# Patient Record
Sex: Male | Born: 1950 | Race: White | Hispanic: No | Marital: Married | Smoking: Former smoker
Health system: Southern US, Community
[De-identification: ages and names within clinical notes are randomized; demographics above are authoritative.]

## PROBLEM LIST (undated history)

## (undated) DIAGNOSIS — G9340 Encephalopathy, unspecified: Secondary | ICD-10-CM

## (undated) DIAGNOSIS — R590 Localized enlarged lymph nodes: Secondary | ICD-10-CM

## (undated) DIAGNOSIS — N400 Enlarged prostate without lower urinary tract symptoms: Secondary | ICD-10-CM

## (undated) DIAGNOSIS — I5031 Acute diastolic (congestive) heart failure: Secondary | ICD-10-CM

## (undated) DIAGNOSIS — G4730 Sleep apnea, unspecified: Secondary | ICD-10-CM

## (undated) DIAGNOSIS — K635 Polyp of colon: Secondary | ICD-10-CM

## (undated) DIAGNOSIS — B029 Zoster without complications: Secondary | ICD-10-CM

## (undated) DIAGNOSIS — J9621 Acute and chronic respiratory failure with hypoxia: Secondary | ICD-10-CM

## (undated) DIAGNOSIS — Z7901 Long term (current) use of anticoagulants: Secondary | ICD-10-CM

## (undated) DIAGNOSIS — D472 Monoclonal gammopathy: Secondary | ICD-10-CM

## (undated) DIAGNOSIS — R413 Other amnesia: Secondary | ICD-10-CM

## (undated) DIAGNOSIS — J449 Chronic obstructive pulmonary disease, unspecified: Secondary | ICD-10-CM

## (undated) DIAGNOSIS — I739 Peripheral vascular disease, unspecified: Secondary | ICD-10-CM

## (undated) DIAGNOSIS — I4891 Unspecified atrial fibrillation: Secondary | ICD-10-CM

## (undated) DIAGNOSIS — G4733 Obstructive sleep apnea (adult) (pediatric): Secondary | ICD-10-CM

## (undated) DIAGNOSIS — E1140 Type 2 diabetes mellitus with diabetic neuropathy, unspecified: Secondary | ICD-10-CM

## (undated) DIAGNOSIS — E669 Obesity, unspecified: Secondary | ICD-10-CM

## (undated) DIAGNOSIS — K219 Gastro-esophageal reflux disease without esophagitis: Secondary | ICD-10-CM

## (undated) DIAGNOSIS — E11319 Type 2 diabetes mellitus with unspecified diabetic retinopathy without macular edema: Secondary | ICD-10-CM

## (undated) DIAGNOSIS — D122 Benign neoplasm of ascending colon: Secondary | ICD-10-CM

## (undated) DIAGNOSIS — M1990 Unspecified osteoarthritis, unspecified site: Secondary | ICD-10-CM

## (undated) DIAGNOSIS — R918 Other nonspecific abnormal finding of lung field: Secondary | ICD-10-CM

## (undated) DIAGNOSIS — C8590 Non-Hodgkin lymphoma, unspecified, unspecified site: Secondary | ICD-10-CM

## (undated) DIAGNOSIS — C8332 Diffuse large B-cell lymphoma, intrathoracic lymph nodes: Secondary | ICD-10-CM

## (undated) DIAGNOSIS — I10 Essential (primary) hypertension: Secondary | ICD-10-CM

## (undated) DIAGNOSIS — J9611 Chronic respiratory failure with hypoxia: Secondary | ICD-10-CM

## (undated) DIAGNOSIS — E785 Hyperlipidemia, unspecified: Secondary | ICD-10-CM

## (undated) DIAGNOSIS — D696 Thrombocytopenia, unspecified: Secondary | ICD-10-CM

## (undated) HISTORY — PX: APPENDECTOMY: SHX54

## (undated) HISTORY — DX: Zoster without complications: B02.9

## (undated) HISTORY — DX: Other nonspecific abnormal finding of lung field: R91.8

## (undated) HISTORY — DX: Peripheral vascular disease, unspecified: I73.9

## (undated) HISTORY — DX: Essential (primary) hypertension: I10

## (undated) HISTORY — DX: Monoclonal gammopathy: D47.2

## (undated) HISTORY — DX: Acute diastolic (congestive) heart failure: I50.31

## (undated) HISTORY — DX: Polyp of colon: K63.5

## (undated) HISTORY — DX: Chronic respiratory failure with hypoxia: J96.11

## (undated) HISTORY — DX: Obesity, unspecified: E66.9

## (undated) HISTORY — DX: Unspecified osteoarthritis, unspecified site: M19.90

## (undated) HISTORY — DX: Benign neoplasm of ascending colon: D12.2

## (undated) HISTORY — DX: Benign prostatic hyperplasia without lower urinary tract symptoms: N40.0

## (undated) HISTORY — DX: Encephalopathy, unspecified: G93.40

## (undated) HISTORY — DX: Acute and chronic respiratory failure with hypoxia: J96.21

## (undated) HISTORY — DX: Long term (current) use of anticoagulants: Z79.01

## (undated) HISTORY — DX: Type 2 diabetes mellitus with unspecified diabetic retinopathy without macular edema: E11.319

## (undated) HISTORY — DX: Localized enlarged lymph nodes: R59.0

## (undated) HISTORY — DX: Chronic obstructive pulmonary disease, unspecified: J44.9

## (undated) HISTORY — DX: Type 2 diabetes mellitus with diabetic neuropathy, unspecified: E11.40

## (undated) HISTORY — DX: Diffuse large b-cell lymphoma, intrathoracic lymph nodes: C83.32

## (undated) HISTORY — DX: Obstructive sleep apnea (adult) (pediatric): G47.33

## (undated) HISTORY — DX: Gastro-esophageal reflux disease without esophagitis: K21.9

## (undated) HISTORY — DX: Other amnesia: R41.3

## (undated) HISTORY — DX: Hyperlipidemia, unspecified: E78.5

## (undated) HISTORY — DX: Thrombocytopenia, unspecified: D69.6

---

## 2001-01-16 ENCOUNTER — Encounter: Payer: Self-pay | Admitting: Gastroenterology

## 2001-01-16 ENCOUNTER — Encounter (INDEPENDENT_AMBULATORY_CARE_PROVIDER_SITE_OTHER): Payer: Self-pay | Admitting: *Deleted

## 2001-01-16 ENCOUNTER — Other Ambulatory Visit: Admission: RE | Admit: 2001-01-16 | Discharge: 2001-01-16 | Payer: Self-pay | Admitting: Gastroenterology

## 2004-02-04 ENCOUNTER — Encounter: Payer: Self-pay | Admitting: Gastroenterology

## 2004-03-10 ENCOUNTER — Ambulatory Visit: Payer: Self-pay | Admitting: Cardiology

## 2005-02-22 ENCOUNTER — Ambulatory Visit: Payer: Self-pay | Admitting: Internal Medicine

## 2005-02-22 ENCOUNTER — Inpatient Hospital Stay (HOSPITAL_COMMUNITY): Admission: AD | Admit: 2005-02-22 | Discharge: 2005-02-24 | Payer: Self-pay | Admitting: Internal Medicine

## 2006-11-03 ENCOUNTER — Ambulatory Visit: Payer: Self-pay | Admitting: Internal Medicine

## 2006-11-23 ENCOUNTER — Ambulatory Visit: Payer: Self-pay | Admitting: Internal Medicine

## 2007-04-23 ENCOUNTER — Telehealth: Payer: Self-pay | Admitting: Internal Medicine

## 2007-04-29 DIAGNOSIS — E119 Type 2 diabetes mellitus without complications: Secondary | ICD-10-CM | POA: Insufficient documentation

## 2007-04-29 DIAGNOSIS — Z794 Long term (current) use of insulin: Secondary | ICD-10-CM

## 2007-04-29 DIAGNOSIS — J189 Pneumonia, unspecified organism: Secondary | ICD-10-CM | POA: Insufficient documentation

## 2007-04-29 DIAGNOSIS — I10 Essential (primary) hypertension: Secondary | ICD-10-CM | POA: Insufficient documentation

## 2007-04-29 DIAGNOSIS — K219 Gastro-esophageal reflux disease without esophagitis: Secondary | ICD-10-CM | POA: Insufficient documentation

## 2007-04-29 DIAGNOSIS — J441 Chronic obstructive pulmonary disease with (acute) exacerbation: Secondary | ICD-10-CM | POA: Insufficient documentation

## 2007-04-29 DIAGNOSIS — J302 Other seasonal allergic rhinitis: Secondary | ICD-10-CM | POA: Insufficient documentation

## 2007-04-29 DIAGNOSIS — J3089 Other allergic rhinitis: Secondary | ICD-10-CM

## 2007-04-29 DIAGNOSIS — Z87898 Personal history of other specified conditions: Secondary | ICD-10-CM

## 2007-04-29 DIAGNOSIS — E785 Hyperlipidemia, unspecified: Secondary | ICD-10-CM

## 2007-10-16 ENCOUNTER — Ambulatory Visit: Payer: Self-pay | Admitting: Internal Medicine

## 2007-10-16 ENCOUNTER — Inpatient Hospital Stay (HOSPITAL_COMMUNITY): Admission: EM | Admit: 2007-10-16 | Discharge: 2007-10-18 | Payer: Self-pay | Admitting: Emergency Medicine

## 2007-10-22 ENCOUNTER — Telehealth: Payer: Self-pay | Admitting: Internal Medicine

## 2007-11-01 ENCOUNTER — Ambulatory Visit: Payer: Self-pay | Admitting: Internal Medicine

## 2008-03-03 ENCOUNTER — Ambulatory Visit: Payer: Self-pay | Admitting: Internal Medicine

## 2008-09-01 ENCOUNTER — Ambulatory Visit: Payer: Self-pay | Admitting: Internal Medicine

## 2009-01-22 ENCOUNTER — Encounter (INDEPENDENT_AMBULATORY_CARE_PROVIDER_SITE_OTHER): Payer: Self-pay | Admitting: *Deleted

## 2009-03-02 ENCOUNTER — Ambulatory Visit: Payer: Self-pay | Admitting: Gastroenterology

## 2009-03-02 DIAGNOSIS — Z860100 Personal history of colon polyps, unspecified: Secondary | ICD-10-CM

## 2009-03-02 HISTORY — DX: Personal history of colonic polyps: Z86.010

## 2009-03-02 HISTORY — DX: Personal history of colon polyps, unspecified: Z86.0100

## 2009-03-09 ENCOUNTER — Telehealth: Payer: Self-pay | Admitting: Gastroenterology

## 2009-03-09 ENCOUNTER — Encounter: Payer: Self-pay | Admitting: Gastroenterology

## 2009-03-18 ENCOUNTER — Ambulatory Visit: Payer: Self-pay | Admitting: Gastroenterology

## 2009-03-25 ENCOUNTER — Encounter: Payer: Self-pay | Admitting: Cardiology

## 2009-03-25 ENCOUNTER — Encounter: Payer: Self-pay | Admitting: Gastroenterology

## 2009-03-25 DIAGNOSIS — R9431 Abnormal electrocardiogram [ECG] [EKG]: Secondary | ICD-10-CM

## 2009-04-27 ENCOUNTER — Ambulatory Visit: Payer: Self-pay | Admitting: Cardiology

## 2009-04-27 ENCOUNTER — Encounter (INDEPENDENT_AMBULATORY_CARE_PROVIDER_SITE_OTHER): Payer: Self-pay | Admitting: *Deleted

## 2009-04-27 DIAGNOSIS — R9430 Abnormal result of cardiovascular function study, unspecified: Secondary | ICD-10-CM | POA: Insufficient documentation

## 2009-04-27 HISTORY — DX: Abnormal result of cardiovascular function study, unspecified: R94.30

## 2009-05-08 ENCOUNTER — Telehealth (INDEPENDENT_AMBULATORY_CARE_PROVIDER_SITE_OTHER): Payer: Self-pay | Admitting: *Deleted

## 2009-05-08 ENCOUNTER — Ambulatory Visit: Payer: Self-pay | Admitting: Cardiology

## 2009-12-17 ENCOUNTER — Ambulatory Visit: Payer: Self-pay | Admitting: Thoracic Surgery

## 2009-12-24 ENCOUNTER — Ambulatory Visit (HOSPITAL_COMMUNITY): Admission: RE | Admit: 2009-12-24 | Discharge: 2009-12-24 | Payer: Self-pay | Admitting: Thoracic Surgery

## 2009-12-29 ENCOUNTER — Ambulatory Visit: Payer: Self-pay | Admitting: Thoracic Surgery

## 2010-01-04 ENCOUNTER — Ambulatory Visit (HOSPITAL_COMMUNITY)
Admission: RE | Admit: 2010-01-04 | Discharge: 2010-01-04 | Payer: Self-pay | Source: Home / Self Care | Admitting: Thoracic Surgery

## 2010-01-06 ENCOUNTER — Ambulatory Visit: Payer: Self-pay | Admitting: Thoracic Surgery

## 2010-01-08 ENCOUNTER — Ambulatory Visit: Payer: Self-pay | Admitting: Oncology

## 2010-01-11 LAB — CBC & DIFF AND RETIC
Basophils Absolute: 0 10*3/uL (ref 0.0–0.1)
EOS%: 1.3 % (ref 0.0–7.0)
Eosinophils Absolute: 0.1 10*3/uL (ref 0.0–0.5)
HCT: 42.8 % (ref 38.4–49.9)
HGB: 14.7 g/dL (ref 13.0–17.1)
Immature Retic Fract: 2.7 % (ref 0.00–13.40)
MCH: 29.9 pg (ref 27.2–33.4)
MCV: 87.2 fL (ref 79.3–98.0)
MONO%: 6.4 % (ref 0.0–14.0)
NEUT#: 5.5 10*3/uL (ref 1.5–6.5)
NEUT%: 73.3 % (ref 39.0–75.0)
RDW: 12.9 % (ref 11.0–14.6)
Retic Ct Abs: 59.9 10*3/uL (ref 24.10–77.50)
lymph#: 1.4 10*3/uL (ref 0.9–3.3)

## 2010-01-11 LAB — MORPHOLOGY: PLT EST: ADEQUATE

## 2010-01-11 LAB — PROTIME-INR: INR: 1 — ABNORMAL LOW (ref 2.00–3.50)

## 2010-01-12 LAB — HEPATITIS A ANTIBODY, IGM: Hep A IgM: NEGATIVE

## 2010-01-12 LAB — COMPREHENSIVE METABOLIC PANEL
ALT: 18 U/L (ref 0–53)
AST: 14 U/L (ref 0–37)
Albumin: 4.1 g/dL (ref 3.5–5.2)
CO2: 27 mEq/L (ref 19–32)
Calcium: 9.9 mg/dL (ref 8.4–10.5)
Chloride: 101 mEq/L (ref 96–112)
Potassium: 4.3 mEq/L (ref 3.5–5.3)

## 2010-01-12 LAB — LACTATE DEHYDROGENASE: LDH: 126 U/L (ref 94–250)

## 2010-01-12 LAB — HEPATITIS C ANTIBODY: HCV Ab: NEGATIVE

## 2010-01-12 LAB — EPSTEIN-BARR VIRUS EARLY D ANTIGEN ANTIBODY, IGG: EBV EA IgG: 0.47 {ISR}

## 2010-01-12 LAB — EPSTEIN-BARR VIRUS VCA, IGG: EBV VCA IgG: 2.91 {ISR} — ABNORMAL HIGH

## 2010-01-12 LAB — EPSTEIN-BARR VIRUS NUCLEAR ANTIGEN ANTIBODY, IGG: EBV NA IgG: 3.33 {ISR} — ABNORMAL HIGH

## 2010-01-12 LAB — HEPATITIS B SURFACE ANTIGEN: Hepatitis B Surface Ag: NEGATIVE

## 2010-01-15 ENCOUNTER — Ambulatory Visit (HOSPITAL_COMMUNITY): Admission: RE | Admit: 2010-01-15 | Discharge: 2010-01-15 | Payer: Self-pay | Admitting: Oncology

## 2010-01-15 ENCOUNTER — Ambulatory Visit: Payer: Self-pay | Admitting: Oncology

## 2010-01-18 ENCOUNTER — Ambulatory Visit (HOSPITAL_COMMUNITY): Admission: RE | Admit: 2010-01-18 | Discharge: 2010-01-18 | Payer: Self-pay | Admitting: Oncology

## 2010-01-20 ENCOUNTER — Inpatient Hospital Stay (HOSPITAL_COMMUNITY): Admission: AD | Admit: 2010-01-20 | Discharge: 2010-01-25 | Payer: Self-pay | Admitting: Oncology

## 2010-01-26 DIAGNOSIS — C8590 Non-Hodgkin lymphoma, unspecified, unspecified site: Secondary | ICD-10-CM | POA: Insufficient documentation

## 2010-01-27 LAB — CBC WITH DIFFERENTIAL/PLATELET
BASO%: 0.5 % (ref 0.0–2.0)
EOS%: 1.6 % (ref 0.0–7.0)
HCT: 39.8 % (ref 38.4–49.9)
LYMPH%: 11.9 % — ABNORMAL LOW (ref 14.0–49.0)
MCH: 30 pg (ref 27.2–33.4)
MCHC: 34.4 g/dL (ref 32.0–36.0)
MONO#: 0 10*3/uL — ABNORMAL LOW (ref 0.1–0.9)
MONO%: 0.1 % (ref 0.0–14.0)
NEUT%: 85.9 % — ABNORMAL HIGH (ref 39.0–75.0)
Platelets: 182 10*3/uL (ref 140–400)
RBC: 4.57 10*6/uL (ref 4.20–5.82)
WBC: 6.5 10*3/uL (ref 4.0–10.3)

## 2010-01-31 ENCOUNTER — Emergency Department (HOSPITAL_COMMUNITY): Admission: EM | Admit: 2010-01-31 | Discharge: 2010-01-31 | Payer: Self-pay | Admitting: Emergency Medicine

## 2010-02-01 LAB — CBC WITH DIFFERENTIAL/PLATELET
BASO%: 2.2 % — ABNORMAL HIGH (ref 0.0–2.0)
EOS%: 4.4 % (ref 0.0–7.0)
HCT: 37.9 % — ABNORMAL LOW (ref 38.4–49.9)
MCH: 29.2 pg (ref 27.2–33.4)
MCHC: 34.8 g/dL (ref 32.0–36.0)
MONO#: 0.5 10*3/uL (ref 0.1–0.9)
NEUT%: 31.8 % — ABNORMAL LOW (ref 39.0–75.0)
RBC: 4.52 10*6/uL (ref 4.20–5.82)
RDW: 12.6 % (ref 11.0–14.6)
WBC: 1.8 10*3/uL — ABNORMAL LOW (ref 4.0–10.3)
lymph#: 0.6 10*3/uL — ABNORMAL LOW (ref 0.9–3.3)
nRBC: 0 % (ref 0–0)

## 2010-02-03 LAB — CBC WITH DIFFERENTIAL/PLATELET
Basophils Absolute: 0 10*3/uL (ref 0.0–0.1)
Eosinophils Absolute: 0.1 10*3/uL (ref 0.0–0.5)
HGB: 13.8 g/dL (ref 13.0–17.1)
MCV: 86.4 fL (ref 79.3–98.0)
MONO%: 2.5 % (ref 0.0–14.0)
NEUT#: 13.4 10*3/uL — ABNORMAL HIGH (ref 1.5–6.5)
RBC: 4.59 10*6/uL (ref 4.20–5.82)
RDW: 12.8 % (ref 11.0–14.6)
WBC: 15.1 10*3/uL — ABNORMAL HIGH (ref 4.0–10.3)
lymph#: 1.2 10*3/uL (ref 0.9–3.3)

## 2010-02-08 ENCOUNTER — Ambulatory Visit: Payer: Self-pay | Admitting: Oncology

## 2010-02-09 LAB — COMPREHENSIVE METABOLIC PANEL
ALT: 21 U/L (ref 0–53)
AST: 20 U/L (ref 0–37)
Albumin: 3.5 g/dL (ref 3.5–5.2)
Alkaline Phosphatase: 63 U/L (ref 39–117)
Chloride: 98 mEq/L (ref 96–112)
Potassium: 5 mEq/L (ref 3.5–5.3)
Sodium: 134 mEq/L — ABNORMAL LOW (ref 135–145)
Total Protein: 6.5 g/dL (ref 6.0–8.3)

## 2010-02-09 LAB — CBC WITH DIFFERENTIAL/PLATELET
Basophils Absolute: 0.5 10*3/uL — ABNORMAL HIGH (ref 0.0–0.1)
Eosinophils Absolute: 0.1 10*3/uL (ref 0.0–0.5)
HGB: 13.6 g/dL (ref 13.0–17.1)
LYMPH%: 11.6 % — ABNORMAL LOW (ref 14.0–49.0)
MONO#: 0.8 10*3/uL (ref 0.1–0.9)
NEUT#: 10.4 10*3/uL — ABNORMAL HIGH (ref 1.5–6.5)
Platelets: 270 10*3/uL (ref 140–400)
RBC: 4.46 10*6/uL (ref 4.20–5.82)
RDW: 13 % (ref 11.0–14.6)
WBC: 13.4 10*3/uL — ABNORMAL HIGH (ref 4.0–10.3)

## 2010-02-09 LAB — MORPHOLOGY

## 2010-02-10 ENCOUNTER — Inpatient Hospital Stay (HOSPITAL_COMMUNITY): Admission: AD | Admit: 2010-02-10 | Discharge: 2010-02-15 | Payer: Self-pay | Admitting: Oncology

## 2010-02-15 ENCOUNTER — Ambulatory Visit: Payer: Self-pay | Admitting: Oncology

## 2010-02-22 LAB — CBC WITH DIFFERENTIAL/PLATELET
BASO%: 2.1 % — ABNORMAL HIGH (ref 0.0–2.0)
EOS%: 4.1 % (ref 0.0–7.0)
HCT: 33.8 % — ABNORMAL LOW (ref 38.4–49.9)
LYMPH%: 22 % (ref 14.0–49.0)
MCH: 30.2 pg (ref 27.2–33.4)
MCHC: 34.9 g/dL (ref 32.0–36.0)
MCV: 86.5 fL (ref 79.3–98.0)
MONO#: 0.6 10*3/uL (ref 0.1–0.9)
MONO%: 18.5 % — ABNORMAL HIGH (ref 0.0–14.0)
NEUT%: 53.3 % (ref 39.0–75.0)
Platelets: 168 10*3/uL (ref 140–400)

## 2010-03-01 ENCOUNTER — Ambulatory Visit (HOSPITAL_COMMUNITY)
Admission: RE | Admit: 2010-03-01 | Discharge: 2010-03-01 | Payer: Self-pay | Source: Home / Self Care | Admitting: Oncology

## 2010-03-01 LAB — CBC WITH DIFFERENTIAL/PLATELET
EOS%: 0.3 % (ref 0.0–7.0)
MCH: 30.6 pg (ref 27.2–33.4)
MCV: 87.4 fL (ref 79.3–98.0)
MONO%: 5.8 % (ref 0.0–14.0)
NEUT#: 12 10*3/uL — ABNORMAL HIGH (ref 1.5–6.5)
RBC: 4.01 10*6/uL — ABNORMAL LOW (ref 4.20–5.82)
RDW: 14.4 % (ref 11.0–14.6)

## 2010-03-01 LAB — COMPREHENSIVE METABOLIC PANEL
ALT: 18 U/L (ref 0–53)
AST: 17 U/L (ref 0–37)
Albumin: 4.1 g/dL (ref 3.5–5.2)
Alkaline Phosphatase: 67 U/L (ref 39–117)
Potassium: 4.2 mEq/L (ref 3.5–5.3)
Sodium: 140 mEq/L (ref 135–145)
Total Protein: 6.3 g/dL (ref 6.0–8.3)

## 2010-03-04 ENCOUNTER — Inpatient Hospital Stay (HOSPITAL_COMMUNITY): Admission: AD | Admit: 2010-03-04 | Discharge: 2010-03-08 | Payer: Self-pay | Admitting: Oncology

## 2010-03-10 ENCOUNTER — Ambulatory Visit: Payer: Self-pay | Admitting: Oncology

## 2010-03-15 LAB — CBC WITH DIFFERENTIAL/PLATELET
Eosinophils Absolute: 0.1 10*3/uL (ref 0.0–0.5)
LYMPH%: 13.9 % — ABNORMAL LOW (ref 14.0–49.0)
MCHC: 35.7 g/dL (ref 32.0–36.0)
MCV: 87.4 fL (ref 79.3–98.0)
MONO%: 10.1 % (ref 0.0–14.0)
Platelets: 151 10*3/uL (ref 140–400)
RBC: 3.69 10*6/uL — ABNORMAL LOW (ref 4.20–5.82)

## 2010-03-15 LAB — URIC ACID: Uric Acid, Serum: 6.5 mg/dL (ref 4.0–7.8)

## 2010-03-15 LAB — COMPREHENSIVE METABOLIC PANEL
Alkaline Phosphatase: 98 U/L (ref 39–117)
Glucose, Bld: 331 mg/dL — ABNORMAL HIGH (ref 70–99)
Sodium: 138 mEq/L (ref 135–145)
Total Bilirubin: 0.8 mg/dL (ref 0.3–1.2)
Total Protein: 6.3 g/dL (ref 6.0–8.3)

## 2010-03-22 ENCOUNTER — Ambulatory Visit (HOSPITAL_COMMUNITY): Admission: RE | Admit: 2010-03-22 | Discharge: 2010-03-22 | Payer: Self-pay | Admitting: Oncology

## 2010-03-23 LAB — COMPREHENSIVE METABOLIC PANEL
ALT: 15 U/L (ref 0–53)
Albumin: 3.8 g/dL (ref 3.5–5.2)
CO2: 25 mEq/L (ref 19–32)
Calcium: 8.8 mg/dL (ref 8.4–10.5)
Chloride: 100 mEq/L (ref 96–112)
Creatinine, Ser: 0.78 mg/dL (ref 0.40–1.50)
Potassium: 4.1 mEq/L (ref 3.5–5.3)
Sodium: 138 mEq/L (ref 135–145)
Total Protein: 5.9 g/dL — ABNORMAL LOW (ref 6.0–8.3)

## 2010-03-23 LAB — CBC WITH DIFFERENTIAL/PLATELET
BASO%: 0.6 % (ref 0.0–2.0)
EOS%: 0.9 % (ref 0.0–7.0)
HCT: 34.6 % — ABNORMAL LOW (ref 38.4–49.9)
LYMPH%: 10.3 % — ABNORMAL LOW (ref 14.0–49.0)
MCH: 29.7 pg (ref 27.2–33.4)
MCHC: 33.8 g/dL (ref 32.0–36.0)
NEUT%: 79 % — ABNORMAL HIGH (ref 39.0–75.0)
Platelets: 217 10*3/uL (ref 140–400)

## 2010-03-23 LAB — LACTATE DEHYDROGENASE: LDH: 197 U/L (ref 94–250)

## 2010-03-24 ENCOUNTER — Inpatient Hospital Stay (HOSPITAL_COMMUNITY)
Admission: AD | Admit: 2010-03-24 | Discharge: 2010-03-28 | Payer: Self-pay | Source: Home / Self Care | Admitting: Oncology

## 2010-03-24 ENCOUNTER — Ambulatory Visit: Payer: Self-pay | Admitting: Oncology

## 2010-04-12 ENCOUNTER — Ambulatory Visit: Payer: Self-pay | Admitting: Oncology

## 2010-04-13 LAB — LACTATE DEHYDROGENASE: LDH: 225 U/L (ref 94–250)

## 2010-04-13 LAB — URIC ACID: Uric Acid, Serum: 4.9 mg/dL (ref 4.0–7.8)

## 2010-04-13 LAB — COMPREHENSIVE METABOLIC PANEL
ALT: 15 U/L (ref 0–53)
CO2: 25 mEq/L (ref 19–32)
Calcium: 8.9 mg/dL (ref 8.4–10.5)
Chloride: 97 mEq/L (ref 96–112)
Creatinine, Ser: 0.92 mg/dL (ref 0.40–1.50)
Glucose, Bld: 419 mg/dL — ABNORMAL HIGH (ref 70–99)
Total Protein: 6.2 g/dL (ref 6.0–8.3)

## 2010-04-13 LAB — CBC WITH DIFFERENTIAL/PLATELET
EOS%: 0.1 % (ref 0.0–7.0)
Eosinophils Absolute: 0 10*3/uL (ref 0.0–0.5)
LYMPH%: 4.5 % — ABNORMAL LOW (ref 14.0–49.0)
MCH: 31.4 pg (ref 27.2–33.4)
MCHC: 33.9 g/dL (ref 32.0–36.0)
MCV: 92.6 fL (ref 79.3–98.0)
MONO%: 6.7 % (ref 0.0–14.0)
NEUT#: 10 10*3/uL — ABNORMAL HIGH (ref 1.5–6.5)
Platelets: 263 10*3/uL (ref 140–400)
RBC: 3.6 10*6/uL — ABNORMAL LOW (ref 4.20–5.82)
RDW: 18.7 % — ABNORMAL HIGH (ref 11.0–14.6)

## 2010-04-13 LAB — MORPHOLOGY: PLT EST: ADEQUATE

## 2010-04-15 ENCOUNTER — Inpatient Hospital Stay (HOSPITAL_COMMUNITY)
Admission: AD | Admit: 2010-04-15 | Discharge: 2010-04-20 | Payer: Self-pay | Source: Home / Self Care | Attending: Oncology | Admitting: Oncology

## 2010-04-16 ENCOUNTER — Telehealth: Payer: Self-pay | Admitting: Internal Medicine

## 2010-04-16 ENCOUNTER — Encounter: Payer: Self-pay | Admitting: Oncology

## 2010-05-03 ENCOUNTER — Encounter: Payer: Self-pay | Admitting: Cardiology

## 2010-05-03 ENCOUNTER — Telehealth: Payer: Self-pay | Admitting: Cardiology

## 2010-05-03 LAB — CBC WITH DIFFERENTIAL/PLATELET
BASO%: 0.4 % (ref 0.0–2.0)
Basophils Absolute: 0 10*3/uL (ref 0.0–0.1)
EOS%: 0.5 % (ref 0.0–7.0)
Eosinophils Absolute: 0 10*3/uL (ref 0.0–0.5)
HCT: 32.2 % — ABNORMAL LOW (ref 38.4–49.9)
HGB: 10.9 g/dL — ABNORMAL LOW (ref 13.0–17.1)
LYMPH%: 12.5 % — ABNORMAL LOW (ref 14.0–49.0)
MCH: 31.5 pg (ref 27.2–33.4)
MCHC: 33.8 g/dL (ref 32.0–36.0)
MCV: 93.3 fL (ref 79.3–98.0)
MONO#: 0.6 10*3/uL (ref 0.1–0.9)
MONO%: 8.3 % (ref 0.0–14.0)
NEUT#: 6.1 10*3/uL (ref 1.5–6.5)
NEUT%: 78.3 % — ABNORMAL HIGH (ref 39.0–75.0)
Platelets: 256 10*3/uL (ref 140–400)
RBC: 3.45 10*6/uL — ABNORMAL LOW (ref 4.20–5.82)
RDW: 17.3 % — ABNORMAL HIGH (ref 11.0–14.6)
WBC: 7.8 10*3/uL (ref 4.0–10.3)
lymph#: 1 10*3/uL (ref 0.9–3.3)

## 2010-05-05 ENCOUNTER — Encounter: Payer: Self-pay | Admitting: Cardiology

## 2010-05-06 ENCOUNTER — Inpatient Hospital Stay (HOSPITAL_COMMUNITY)
Admission: AD | Admit: 2010-05-06 | Discharge: 2010-05-10 | Payer: Self-pay | Source: Home / Self Care | Attending: Oncology | Admitting: Oncology

## 2010-05-10 LAB — DIFFERENTIAL
Basophils Absolute: 0 10*3/uL (ref 0.0–0.1)
Basophils Relative: 0 % (ref 0–1)
Eosinophils Absolute: 0 10*3/uL (ref 0.0–0.7)
Eosinophils Relative: 0 % (ref 0–5)
Lymphocytes Relative: 8 % — ABNORMAL LOW (ref 12–46)
Lymphs Abs: 0.8 10*3/uL (ref 0.7–4.0)
Monocytes Absolute: 0.8 10*3/uL (ref 0.1–1.0)
Monocytes Relative: 8 % (ref 3–12)
Neutro Abs: 8.6 10*3/uL — ABNORMAL HIGH (ref 1.7–7.7)
Neutrophils Relative %: 84 % — ABNORMAL HIGH (ref 43–77)

## 2010-05-10 LAB — CBC
HCT: 31.8 % — ABNORMAL LOW (ref 39.0–52.0)
HCT: 29.6 % — ABNORMAL LOW (ref 39.0–52.0)
Hemoglobin: 10.6 g/dL — ABNORMAL LOW (ref 13.0–17.0)
Hemoglobin: 9.8 g/dL — ABNORMAL LOW (ref 13.0–17.0)
MCH: 31.7 pg (ref 26.0–34.0)
MCH: 31.5 pg (ref 26.0–34.0)
MCHC: 33.3 g/dL (ref 30.0–36.0)
MCHC: 33.1 g/dL (ref 30.0–36.0)
MCV: 95.2 fL (ref 78.0–100.0)
MCV: 95.2 fL (ref 78.0–100.0)
Platelets: 280 10*3/uL (ref 150–400)
Platelets: 270 10*3/uL (ref 150–400)
RBC: 3.34 MIL/uL — ABNORMAL LOW (ref 4.22–5.81)
RBC: 3.11 MIL/uL — ABNORMAL LOW (ref 4.22–5.81)
RDW: 17.1 % — ABNORMAL HIGH (ref 11.5–15.5)
RDW: 17 % — ABNORMAL HIGH (ref 11.5–15.5)
WBC: 8.1 10*3/uL (ref 4.0–10.5)
WBC: 10.1 10*3/uL (ref 4.0–10.5)

## 2010-05-10 LAB — GLUCOSE, CAPILLARY
Glucose-Capillary: 217 mg/dL — ABNORMAL HIGH (ref 70–99)
Glucose-Capillary: 256 mg/dL — ABNORMAL HIGH (ref 70–99)
Glucose-Capillary: 238 mg/dL — ABNORMAL HIGH (ref 70–99)
Glucose-Capillary: 155 mg/dL — ABNORMAL HIGH (ref 70–99)
Glucose-Capillary: 380 mg/dL — ABNORMAL HIGH (ref 70–99)
Glucose-Capillary: 308 mg/dL — ABNORMAL HIGH (ref 70–99)
Glucose-Capillary: 179 mg/dL — ABNORMAL HIGH (ref 70–99)
Glucose-Capillary: 153 mg/dL — ABNORMAL HIGH (ref 70–99)
Glucose-Capillary: 278 mg/dL — ABNORMAL HIGH (ref 70–99)
Glucose-Capillary: 280 mg/dL — ABNORMAL HIGH (ref 70–99)
Glucose-Capillary: 120 mg/dL — ABNORMAL HIGH (ref 70–99)
Glucose-Capillary: 134 mg/dL — ABNORMAL HIGH (ref 70–99)
Glucose-Capillary: 402 mg/dL — ABNORMAL HIGH (ref 70–99)
Glucose-Capillary: 410 mg/dL — ABNORMAL HIGH (ref 70–99)
Glucose-Capillary: 288 mg/dL — ABNORMAL HIGH (ref 70–99)

## 2010-05-10 LAB — PROTIME-INR
INR: 1.23 (ref 0.00–1.49)
INR: 1.22 (ref 0.00–1.49)
INR: 1.61 — ABNORMAL HIGH (ref 0.00–1.49)
INR: 1.64 — ABNORMAL HIGH (ref 0.00–1.49)
Prothrombin Time: 15.7 seconds — ABNORMAL HIGH (ref 11.6–15.2)
Prothrombin Time: 15.6 seconds — ABNORMAL HIGH (ref 11.6–15.2)
Prothrombin Time: 19.3 seconds — ABNORMAL HIGH (ref 11.6–15.2)
Prothrombin Time: 19.6 seconds — ABNORMAL HIGH (ref 11.6–15.2)

## 2010-05-10 LAB — URINALYSIS, ROUTINE W REFLEX MICROSCOPIC
Bilirubin Urine: NEGATIVE
Hgb urine dipstick: NEGATIVE
Ketones, ur: NEGATIVE mg/dL
Leukocytes, UA: NEGATIVE
Nitrite: NEGATIVE
Protein, ur: NEGATIVE mg/dL
Specific Gravity, Urine: 1.03 (ref 1.005–1.030)
Urine Glucose, Fasting: >1000 mg/dL — AB
Urobilinogen, UA: 0.2 mg/dL (ref 0.0–1.0)
pH: 6 (ref 5.0–8.0)

## 2010-05-10 LAB — COMPREHENSIVE METABOLIC PANEL
ALT: 18 U/L (ref 0–53)
ALT: 16 U/L (ref 0–53)
AST: 29 U/L (ref 0–37)
AST: 17 U/L (ref 0–37)
Albumin: 3.2 g/dL — ABNORMAL LOW (ref 3.5–5.2)
Albumin: 3 g/dL — ABNORMAL LOW (ref 3.5–5.2)
Alkaline Phosphatase: 67 U/L (ref 39–117)
Alkaline Phosphatase: 58 U/L (ref 39–117)
BUN: 18 mg/dL (ref 6–23)
BUN: 15 mg/dL (ref 6–23)
CO2: 29 mEq/L (ref 19–32)
CO2: 28 mEq/L (ref 19–32)
Calcium: 9 mg/dL (ref 8.4–10.5)
Calcium: 8.9 mg/dL (ref 8.4–10.5)
Chloride: 99 mEq/L (ref 96–112)
Chloride: 100 mEq/L (ref 96–112)
Creatinine, Ser: 0.83 mg/dL (ref 0.4–1.5)
Creatinine, Ser: 0.76 mg/dL (ref 0.4–1.5)
GFR calc Af Amer: >60 mL/min (ref >60)
GFR calc Af Amer: >60 mL/min (ref >60)
GFR calc non Af Amer: >60 mL/min (ref >60)
GFR calc non Af Amer: >60 mL/min (ref >60)
Glucose, Bld: 288 mg/dL — ABNORMAL HIGH (ref 70–99)
Glucose, Bld: 188 mg/dL — ABNORMAL HIGH (ref 70–99)
Potassium: 4.5 mEq/L (ref 3.5–5.1)
Potassium: 4.1 mEq/L (ref 3.5–5.1)
Sodium: 136 mEq/L (ref 135–145)
Sodium: 135 mEq/L (ref 135–145)
Total Bilirubin: 0.9 mg/dL (ref 0.3–1.2)
Total Bilirubin: 0.7 mg/dL (ref 0.3–1.2)
Total Protein: 6.1 g/dL (ref 6.0–8.3)
Total Protein: 5.6 g/dL — ABNORMAL LOW (ref 6.0–8.3)

## 2010-05-10 LAB — GLUCOSE, RANDOM: Glucose, Bld: 381 mg/dL — ABNORMAL HIGH (ref 70–99)

## 2010-05-10 LAB — LACTATE DEHYDROGENASE: LDH: 171 U/L (ref 94–250)

## 2010-05-10 LAB — URINE MICROSCOPIC-ADD ON: Urine-Other: NONE SEEN

## 2010-05-11 LAB — PROTIME-INR
INR: 1.7 — ABNORMAL LOW (ref 2.00–3.50)
Protime: 20.4 Seconds — ABNORMAL HIGH (ref 10.6–13.4)

## 2010-05-12 ENCOUNTER — Encounter: Payer: Self-pay | Admitting: Cardiology

## 2010-05-12 ENCOUNTER — Ambulatory Visit
Admission: RE | Admit: 2010-05-12 | Discharge: 2010-05-12 | Payer: Self-pay | Source: Home / Self Care | Attending: Cardiology | Admitting: Cardiology

## 2010-05-12 DIAGNOSIS — I4891 Unspecified atrial fibrillation: Secondary | ICD-10-CM | POA: Insufficient documentation

## 2010-05-12 LAB — GLUCOSE, CAPILLARY
Glucose-Capillary: 119 mg/dL — ABNORMAL HIGH (ref 70–99)
Glucose-Capillary: 109 mg/dL — ABNORMAL HIGH (ref 70–99)
Glucose-Capillary: 323 mg/dL — ABNORMAL HIGH (ref 70–99)

## 2010-05-14 ENCOUNTER — Ambulatory Visit (HOSPITAL_BASED_OUTPATIENT_CLINIC_OR_DEPARTMENT_OTHER): Payer: Medicare Other | Admitting: Oncology

## 2010-05-17 ENCOUNTER — Inpatient Hospital Stay (HOSPITAL_COMMUNITY)
Admission: AD | Admit: 2010-05-17 | Discharge: 2010-05-22 | Payer: Self-pay | Source: Home / Self Care | Attending: Oncology | Admitting: Oncology

## 2010-05-17 LAB — CBC WITH DIFFERENTIAL/PLATELET
BASO%: 3.2 % — ABNORMAL HIGH (ref 0.0–2.0)
Basophils Absolute: 0 10*3/uL (ref 0.0–0.1)
EOS%: 0 % (ref 0.0–7.0)
Eosinophils Absolute: 0 10*3/uL (ref 0.0–0.5)
HCT: 25 % — ABNORMAL LOW (ref 38.4–49.9)
HGB: 8.7 g/dL — ABNORMAL LOW (ref 13.0–17.1)
LYMPH%: 83.9 % — ABNORMAL HIGH (ref 14.0–49.0)
MCH: 30.3 pg (ref 27.2–33.4)
MCHC: 34.8 g/dL (ref 32.0–36.0)
MCV: 87.1 fL (ref 79.3–98.0)
MONO#: 0 10*3/uL — ABNORMAL LOW (ref 0.1–0.9)
MONO%: 6.5 % (ref 0.0–14.0)
NEUT#: 0 10*3/uL — CL (ref 1.5–6.5)
NEUT%: 6.4 % — ABNORMAL LOW (ref 39.0–75.0)
Platelets: 15 10*3/uL — ABNORMAL LOW (ref 140–400)
RBC: 2.87 10*6/uL — ABNORMAL LOW (ref 4.20–5.82)
RDW: 14.6 % (ref 11.0–14.6)
WBC: 0.3 10*3/uL — CL (ref 4.0–10.3)
lymph#: 0.3 10*3/uL — ABNORMAL LOW (ref 0.9–3.3)
nRBC: 0 % (ref 0–0)

## 2010-05-17 LAB — BASIC METABOLIC PANEL
BUN: 20 mg/dL (ref 6–23)
CO2: 26 mEq/L (ref 19–32)
Calcium: 8.1 mg/dL — ABNORMAL LOW (ref 8.4–10.5)
Chloride: 100 mEq/L (ref 96–112)
Creatinine, Ser: 1.02 mg/dL (ref 0.40–1.50)
Glucose, Bld: 271 mg/dL — ABNORMAL HIGH (ref 70–99)
Potassium: 4.4 mEq/L (ref 3.5–5.3)
Sodium: 133 mEq/L — ABNORMAL LOW (ref 135–145)

## 2010-05-17 LAB — HOLD TUBE, BLOOD BANK

## 2010-05-18 LAB — COMPREHENSIVE METABOLIC PANEL
ALT: 11 U/L (ref 0–53)
AST: 9 U/L (ref 0–37)
Albumin: 2.5 g/dL — ABNORMAL LOW (ref 3.5–5.2)
Alkaline Phosphatase: 46 U/L (ref 39–117)
BUN: 21 mg/dL (ref 6–23)
CO2: 24 mEq/L (ref 19–32)
Calcium: 7.7 mg/dL — ABNORMAL LOW (ref 8.4–10.5)
Chloride: 101 mEq/L (ref 96–112)
Creatinine, Ser: 1.03 mg/dL (ref 0.4–1.5)
GFR calc Af Amer: >60 mL/min (ref >60)
GFR calc non Af Amer: >60 mL/min (ref >60)
Glucose, Bld: 244 mg/dL — ABNORMAL HIGH (ref 70–99)
Potassium: 4.2 mEq/L (ref 3.5–5.1)
Sodium: 130 mEq/L — ABNORMAL LOW (ref 135–145)
Total Bilirubin: 1 mg/dL (ref 0.3–1.2)
Total Protein: 5 g/dL — ABNORMAL LOW (ref 6.0–8.3)

## 2010-05-18 LAB — GLUCOSE, CAPILLARY
Glucose-Capillary: 178 mg/dL — ABNORMAL HIGH (ref 70–99)
Glucose-Capillary: 251 mg/dL — ABNORMAL HIGH (ref 70–99)

## 2010-05-18 LAB — LACTIC ACID, PLASMA: Lactic Acid, Venous: 1.7 mmol/L (ref 0.5–2.2)

## 2010-05-18 LAB — SAMPLE TO BLOOD BANK

## 2010-05-18 LAB — PROTIME-INR
INR: 2.23 — ABNORMAL HIGH (ref 0.00–1.49)
Prothrombin Time: 24.8 seconds — ABNORMAL HIGH (ref 11.6–15.2)

## 2010-05-18 LAB — PROCALCITONIN: Procalcitonin: 0.1 ng/mL

## 2010-05-19 ENCOUNTER — Ambulatory Visit: Admit: 2010-05-19 | Payer: Self-pay

## 2010-05-19 LAB — GLUCOSE, CAPILLARY
Glucose-Capillary: 187 mg/dL — ABNORMAL HIGH (ref 70–99)
Glucose-Capillary: 206 mg/dL — ABNORMAL HIGH (ref 70–99)
Glucose-Capillary: 100 mg/dL — ABNORMAL HIGH (ref 70–99)
Glucose-Capillary: 73 mg/dL (ref 70–99)
Glucose-Capillary: 162 mg/dL — ABNORMAL HIGH (ref 70–99)
Glucose-Capillary: 148 mg/dL — ABNORMAL HIGH (ref 70–99)

## 2010-05-19 LAB — HEPATIC FUNCTION PANEL
Alkaline Phosphatase: 41 U/L (ref 39–117)
Bilirubin, Direct: 0.2 mg/dL (ref 0.0–0.3)
Indirect Bilirubin: 0.5 mg/dL (ref 0.3–0.9)
Total Protein: 4.7 g/dL — ABNORMAL LOW (ref 6.0–8.3)

## 2010-05-19 LAB — CBC
HCT: 25.1 % — ABNORMAL LOW (ref 39.0–52.0)
HCT: 19.3 % — ABNORMAL LOW (ref 39.0–52.0)
HCT: 19.6 % — ABNORMAL LOW (ref 39.0–52.0)
HCT: 23.1 % — ABNORMAL LOW (ref 39.0–52.0)
MCH: 31 pg (ref 26.0–34.0)
MCH: 30.5 pg (ref 26.0–34.0)
MCH: 30.5 pg (ref 26.0–34.0)
MCHC: 35.2 g/dL (ref 30.0–36.0)
MCHC: 35.5 g/dL (ref 30.0–36.0)
MCV: 88.4 fL (ref 78.0–100.0)
MCV: 86.5 fL (ref 78.0–100.0)
MCV: 86.7 fL (ref 78.0–100.0)
MCV: 86.8 fL (ref 78.0–100.0)
Platelets: 7 10*3/uL — CL (ref 150–400)
Platelets: 29 10*3/uL — CL (ref 150–400)
Platelets: 30 10*3/uL — ABNORMAL LOW (ref 150–400)
Platelets: 28 10*3/uL — CL (ref 150–400)
RBC: 2.84 MIL/uL — ABNORMAL LOW (ref 4.22–5.81)
RBC: 2.04 MIL/uL — ABNORMAL LOW (ref 4.22–5.81)
RBC: 3.43 MIL/uL — ABNORMAL LOW (ref 4.22–5.81)
RDW: 15 % (ref 11.5–15.5)
RDW: 15 % (ref 11.5–15.5)
RDW: 14.8 % (ref 11.5–15.5)
RDW: 14.8 % (ref 11.5–15.5)
WBC: 0.5 10*3/uL — CL (ref 4.0–10.5)
WBC: 1.8 10*3/uL — ABNORMAL LOW (ref 4.0–10.5)
WBC: 2.2 10*3/uL — ABNORMAL LOW (ref 4.0–10.5)

## 2010-05-19 LAB — PREPARE PLATELETS

## 2010-05-19 LAB — DIFFERENTIAL
Band Neutrophils: 0 % (ref 0–10)
Band Neutrophils: 0 % (ref 0–10)
Basophils Relative: 0 % (ref 0–1)
Basophils Relative: 0 % (ref 0–1)
Basophils Relative: 0 % (ref 0–1)
Eosinophils Absolute: 0 10*3/uL (ref 0.0–0.7)
Eosinophils Absolute: 0 10*3/uL (ref 0.0–0.7)
Eosinophils Absolute: 0 10*3/uL (ref 0.0–0.7)
Lymphs Abs: 0 10*3/uL — ABNORMAL LOW (ref 0.7–4.0)
Lymphs Abs: 0.2 10*3/uL — ABNORMAL LOW (ref 0.7–4.0)
Monocytes Absolute: 0 10*3/uL — ABNORMAL LOW (ref 0.1–1.0)
Monocytes Absolute: 0.1 10*3/uL (ref 0.1–1.0)
Monocytes Relative: 0 % — ABNORMAL LOW (ref 3–12)
Neutro Abs: 0.2 10*3/uL — ABNORMAL LOW (ref 1.7–7.7)
nRBC: 0 /100 WBC

## 2010-05-19 LAB — PREPARE RBC (CROSSMATCH)

## 2010-05-19 LAB — CARBOXYHEMOGLOBIN
Carboxyhemoglobin: 2.1 % — ABNORMAL HIGH (ref 0.5–1.5)
Carboxyhemoglobin: 2.3 % — ABNORMAL HIGH (ref 0.5–1.5)
Carboxyhemoglobin: 2.4 % — ABNORMAL HIGH (ref 0.5–1.5)
Methemoglobin: 0.9 % (ref 0.0–1.5)
Methemoglobin: 1.1 % (ref 0.0–1.5)
Methemoglobin: 1.1 % (ref 0.0–1.5)
O2 Saturation: 61.6 %
O2 Saturation: 60.3 %
O2 Saturation: 63.4 %
Total hemoglobin: 10.3 g/dL — ABNORMAL LOW (ref 13.5–18.0)
Total hemoglobin: 8.9 g/dL — ABNORMAL LOW (ref 13.5–18.0)

## 2010-05-19 LAB — URINE CULTURE
Colony Count: NO GROWTH
Culture  Setup Time: 201201241525
Culture: NO GROWTH

## 2010-05-19 LAB — COMPREHENSIVE METABOLIC PANEL
Alkaline Phosphatase: 49 U/L (ref 39–117)
BUN: 7 mg/dL (ref 6–23)
GFR calc non Af Amer: >60 mL/min (ref >60)
Glucose, Bld: 77 mg/dL (ref 70–99)
Potassium: 3.6 mEq/L (ref 3.5–5.1)
Total Protein: 4.9 g/dL — ABNORMAL LOW (ref 6.0–8.3)

## 2010-05-19 LAB — MAGNESIUM: Magnesium: 1.3 mg/dL — ABNORMAL LOW (ref 1.5–2.5)

## 2010-05-19 LAB — PROTIME-INR
INR: 2.14 — ABNORMAL HIGH (ref 0.00–1.49)
Prothrombin Time: 27.2 seconds — ABNORMAL HIGH (ref 11.6–15.2)

## 2010-05-19 LAB — APTT: aPTT: 47 seconds — ABNORMAL HIGH (ref 24–37)

## 2010-05-19 LAB — TROPONIN I: Troponin I: 0.01 ng/mL (ref 0.00–0.06)

## 2010-05-19 LAB — URINALYSIS, ROUTINE W REFLEX MICROSCOPIC
Bilirubin Urine: NEGATIVE
Hgb urine dipstick: NEGATIVE
Ketones, ur: NEGATIVE mg/dL
Nitrite: NEGATIVE
Specific Gravity, Urine: 1.022 (ref 1.005–1.030)
Urobilinogen, UA: 0.2 mg/dL (ref 0.0–1.0)
pH: 5.5 (ref 5.0–8.0)

## 2010-05-19 LAB — LACTIC ACID, PLASMA: Lactic Acid, Venous: 1.4 mmol/L (ref 0.5–2.2)

## 2010-05-19 LAB — MRSA PCR SCREENING: MRSA by PCR: NEGATIVE

## 2010-05-19 LAB — BASIC METABOLIC PANEL
BUN: 15 mg/dL (ref 6–23)
Creatinine, Ser: 0.85 mg/dL (ref 0.4–1.5)
GFR calc non Af Amer: >60 mL/min (ref >60)

## 2010-05-19 LAB — CORTISOL: Cortisol, Plasma: 8.8 ug/dL

## 2010-05-19 LAB — HEMOCCULT GUIAC POC 1CARD (OFFICE): Fecal Occult Bld: NEGATIVE

## 2010-05-20 LAB — CBC
MCH: 30.4 pg (ref 26.0–34.0)
MCHC: 35.2 g/dL (ref 30.0–36.0)
RDW: 15.1 % (ref 11.5–15.5)

## 2010-05-20 LAB — GLUCOSE, CAPILLARY
Glucose-Capillary: 91 mg/dL (ref 70–99)
Glucose-Capillary: 172 mg/dL — ABNORMAL HIGH (ref 70–99)
Glucose-Capillary: 212 mg/dL — ABNORMAL HIGH (ref 70–99)
Glucose-Capillary: 157 mg/dL — ABNORMAL HIGH (ref 70–99)

## 2010-05-20 LAB — BASIC METABOLIC PANEL
BUN: 5 mg/dL — ABNORMAL LOW (ref 6–23)
Chloride: 109 mEq/L (ref 96–112)
Creatinine, Ser: 0.67 mg/dL (ref 0.4–1.5)
Glucose, Bld: 69 mg/dL — ABNORMAL LOW (ref 70–99)
Potassium: 3.7 mEq/L (ref 3.5–5.1)

## 2010-05-20 LAB — PROTIME-INR
INR: 2.14 — ABNORMAL HIGH (ref 0.00–1.49)
Prothrombin Time: 24.1 seconds — ABNORMAL HIGH (ref 11.6–15.2)

## 2010-05-20 NOTE — Consult Note (Addendum)
NAME:  Justin Ruiz, Justin Ruiz NO.:  [id number]  MEDICAL RECORD NO.:  [id number]          PATIENT TYPE:  INP  LOCATION:  1435                         FACILITY:  [facility]  PHYSICIAN:  Colleen Can. Deborah Chalk, M.D.DATE OF BIRTH:  1950-08-05  DATE OF CONSULTATION:  04/15/2010 DATE OF DISCHARGE:                                CONSULTATION   PRIMARY CARDIOLOGIST:  Rollene Rotunda, MD, [facility].  PRIMARY CARE PHYSICIAN:  [name], MD  REFERRING PHYSICIAN:  Genene Churn. Granfortuna, MD  REASON FOR CONSULTATION:  New-onset atrial fibrillation.  PAST MEDICAL HISTORY: 1. 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     a.     Currently on chemotherapy. 2. Chronic obstructive pulmonary disease. 3. Hypertension. 4. Diabetes mellitus type 2, insulin dependent. 5. Hyperlipidemia. 6. Obesity. 7. Gastroesophageal reflux disease. 8. Arthritis. 9. Status post Myoview in January 2011, demonstrating low-risk     evaluation with an EF of 70%. 10.Status post appendectomy as a child.  HISTORY OF PRESENT ILLNESS:  This is a 60 year old gentleman with the above-stated problem list, who is at [facility] for an elective admission to continue chemotherapy for high-grade B-cell non-Hodgkin lymphoma.  The patient is currently on cycle #5 of 6 of Rituxan/EPOCH. Upon evaluation today by Dr. Cyndie Chime, the patient was noted to be in irregular rhythm.  An EKG was obtained that showed new-onset atrial fibrillation at a rate of 119 beats per minute.  A repeat EKG then demonstrated sinus tachycardia at a rate of 106 beats per minute with no acute changes.  Cardiology was asked to evaluate the patient for new- onset atrial fibrillation.  During evaluation, the patient denies any symptoms of chest pain, shortness of breath, diaphoresis, or nausea/vomiting.  He states he had been in his usual state of health besides fatigue with chemotherapy.  He also has a nonproductive cough.   The patient denies any known arrhythmias.  He has also had no further cardiac workup since his Myoview was low risk and risk modification was encouraged.  SOCIAL HISTORY:  The patient lives in [place] with his wife.  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  The patient has a 40 pack-year smoking history, but quit approximately 4 years ago.  He denies any alcohol or illicit drug use.  FAMILY HISTORY:  His mother passed away from liver cancer, but did experience myocardial infarction in her 1s.  His father passed away from colon cancer.  He has sisters in their 1s with what he believes to be coronary artery disease.  ALLERGIES:  No known drug allergies.  INPATIENT MEDICATIONS: 1. Rocephin 2 g IV daily. 2. Mycelex 10 mg 3 times a day. 3. Colace 100 mg twice a day. 4. Lovenox 60 mg subcutaneously daily. 5. Vepesid 156 mg daily. 6. NovoLog sliding scale insulin. 7. NovoLog 65 units subcutaneously twice daily. 8. Xopenex 0.63 mg inhaled 4 times a day. 9. Enalapril 10 mg daily. 10.Protonix 40 mg daily.11.MiraLax 17 g daily. 12.Prednisone taper. 13.Bactrim 1 tablet Monday, Wednesday, and Friday.  REVIEW OF SYSTEMS:  All pertinent positives and negatives as stated in HPI.  All other systems have been reviewed and are negative.  CODE STATUS:  Full.  PHYSICAL EXAMINATION:  GENERAL:  Temperature 98.3, pulse 103, respirations 22, blood pressure 90/60, O2 saturation 96% on room air. GENERAL:  This is a well-developed, well-nourished, middle-aged gentleman.  He is polite in conversation.  He is in no acute distress. HEENT:  Normal. NECK:  Supple without bruit or JVD. HEART:  Regular rate and rhythm with S1 and S2.  No murmur, rub, or gallop noted.  Pulses are 2+ and equal bilaterally. LUNGS:  Clear to auscultation bilaterally without wheezes, rales, or rhonchi. ABDOMEN:  Obese, nontender, positive bowel sounds x4. MUSCULOSKELETAL:  No joint deformities. NEUROLOGIC:  Alert  and oriented x3, cranial nerves II through XII grossly intact.  LABORATORY DATA:  Pending.  EKG as in HPI.  ASSESSMENT: 1. Paroxysmal atrial fibrillation, currently in normal sinus rhythm. 2. Non-Hodgkin lymphoma 3. Hypotension. 4. History of hypertension. 5. Obesity. 6. Insulin-dependent diabetes mellitus. 7. Negative Myoview cardiac study in January 2011, with an ejection     fraction of 70%.  PLAN:  We would like to move the patient to telemetry and check a 2-D echocardiogram.  We would currently recommend holding Adriamycin until echo is complete.  We would also like to discontinue enalapril and start a low-dose systolic daily as blood pressure tolerates.  We will also check routine labs and an EKG in the morning.  Of note, Rituxan is also associated with arrhythmias.  If the patient's echo is okay, we feel it is more important to continue chemotherapy.  The patient is also receiving full-dose Lovenox and further discussion for anticoagulation with a CHADS score of 2 will be discussed further with Dr. Cyndie Chime.  Thank you for this consultation.  We will continue to follow along with you.Leonette Monarch, PA-C   ______________________________ Colleen Can Deborah Chalk, M.D.    NB/MEDQ  D:  04/15/2010  T:  04/16/2010  Job:  [id number]  Electronically Signed by Alen Blew P.A. on 05/04/2010 08:40:35 PM Electronically Signed by Roger Shelter M.D. on 05/20/2010 03:35:36 PM

## 2010-05-21 LAB — CROSSMATCH
ABO/RH(D): AB POS
Antibody Screen: NEGATIVE
Unit division: 0

## 2010-05-21 LAB — PROTIME-INR
INR: 1.98 — ABNORMAL HIGH (ref 0.00–1.49)
Prothrombin Time: 22.7 seconds — ABNORMAL HIGH (ref 11.6–15.2)

## 2010-05-21 LAB — CBC
Hemoglobin: 9.6 g/dL — ABNORMAL LOW (ref 13.0–17.0)
MCH: 29.7 pg (ref 26.0–34.0)
MCHC: 34.4 g/dL (ref 30.0–36.0)
Platelets: 65 10*3/uL — ABNORMAL LOW (ref 150–400)
RDW: 14.9 % (ref 11.5–15.5)

## 2010-05-21 LAB — BASIC METABOLIC PANEL
BUN: 6 mg/dL (ref 6–23)
Calcium: 8.8 mg/dL (ref 8.4–10.5)
Creatinine, Ser: 0.75 mg/dL (ref 0.4–1.5)
GFR calc Af Amer: >60 mL/min (ref >60)

## 2010-05-21 NOTE — H&P (Addendum)
NAME:  Justin Ruiz, GEIGER NO.:  [id number]  MEDICAL RECORD NO.:  [id number]           PATIENT TYPE:  LOCATION:                                 FACILITY:  PHYSICIAN:  Genene Churn. Cinthia Rodden, M.D.DATE OF BIRTH:  1951-01-26  DATE OF ADMISSION:  05/06/2010 DATE OF DISCHARGE:                             HISTORY & PHYSICAL   REASON FOR ADMISSION:  Non-Hodgkin's lymphoma for cycle 6 Epoch/Rituxan.  HISTORY OF PRESENT ILLNESS:  Justin Ruiz is a 60 year old man who was diagnosed with a high-grade B-cell non-Hodgkin's lymphoma in September 2011, when a CT scan done to followup an abnormal chest x-ray incidentally showed retroperitoneal lymphadenopathy.  Needle core biopsy of an area of retroperitoneal adenopathy January 04, 2010, showed a high-grade B-cell non-Hodgkin's lymphoma with light microscopy, suggestive of Burkitt's lymphoma.  Chromosome studies, however, did not confirm the typical 8:14 translocation seen with Burkitt's.  There were 2 isolated areas of adenopathy in the abdomen, but no other areas of hypermetabolic activity on a PET scan.  Bone marrow was negative for involvement.  Due to the aggressive appearing histology, he was started on a program of Rituxan plus Epoch chemotherapy beginning cycle 1 on January 21, 2010.  He developed a severe ileus due to vincristine following the first cycle.  The vincristine dose was reduced by 50% with cycles 2 and 3 with no recurrence of the ileus.  Restaging CT evaluation after 3 cycles showed significant regression of tumor.  He began cycle 4 Epoch on March 24, 2010.  The chemotherapy doses were escalated per protocol with cycle 4.  He was admitted for cycle 5 on April 15, 2010.  He was found to be in atrial fibrillation, new onset.  He was evaluated by Cardiology and medications were adjusted.  He had converted back to normal sinus rhythm prior to the medication adjustment.  A 2D echo on April 16, 2010,  was a technically limited study.  However, the overall LV function was suspected to be low normal.  Cycle 5 was proceeded with on April 16, 2010.  Justin Ruiz presented for a routine office visit on May 03, 2010.  He was found to be back in atrial fibrillation with rapid ventricular response.  Dr. Cyndie Chime spoke with Dr. Antoine Poche with recommendations for increasing the Toprol XL to 50 mg twice daily, begin Coumadin anticoagulation, proceed with chemotherapy as planned and obtain a Cardiology consult during the upcoming hospitalization.  Repeat EKG on May 05, 2010 showed persistent atrial fibrillation.  [name] received Rituxan as planned May 05, 2010.  [name] presents for elective admission to proceed with cycle 6 Epoch.  PAST MEDICAL HISTORY: 1. Hypertension. 2. Asymptomatic CAD. 3. Insulin-dependent diabetes. 4. Obesity. 5. Sleep apnea syndrome. 6. Obstructive airway disease.  MEDICATIONS: 1. Toprol XL 50 mg twice daily. 2. Coumadin 5 mg daily. 3. Ultram 50 mg every 4 hours as needed. 4. Albuterol metered-dose inhaler 2 puffs every 4 hours as needed. 5. Glucophage 500 mg 3 times daily. 6. Zocor 40 mg nightly. 7. Aspirin 81 mg daily. 8. Humalog insulin 75/25, 54 units twice daily. 9. Vitamin D  50,000 units weekly. 10.Septra DS Monday, Wednesday, Friday. 11.Mycelex troche 10 mg 4 times daily. 12.Benadryl 25 mg twice daily as needed. 13.Naphcon eyedrops twice daily as needed for allergies.  ALLERGIES:  No known drug allergies.  FAMILY HISTORY:  Mother died at age 34 of liver cancer.  Father died at age 67 with complications of prostate and colon cancer.  Half sister, age 54, with cardiac disease and history of MI.  One sister died of sudden death at age 30.  One sister died of throat cancer and possibly an aneurysm at age 20.  One sister died of probable MI in her 40s.  SOCIAL HISTORY:  Justin Ruiz is married.  He has 2 stepchildren from his current  marriage.  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  One-half to one-pack-per-day cigarette smoker.  He quit smoking 4 years ago.  No alcohol use.  REVIEW OF SYSTEMS:  He overall feels well.  He has a good appetite.  He does note that his energy level is slowly declining.  He denies any fevers or sweats.  No unusual headaches or vision change.  No shortness of breath or cough.  He denies chest pain or palpitations.  No nausea or vomiting with the most recent cycle of chemotherapy.  No constipation. He denies mouth sores.  No hematuria or dysuria.  He has stable mild tingling in his fingertips.  The tingling does not interfere with function.  PHYSICAL EXAMINATION:  GENERAL:  Pleasant male in no acute distress. HEENT:  Normocephalic, atraumatic.  Pupils equal, round, reactive to light.  Extraocular movements are intact.  Sclerae anicteric. Oropharynx is without thrush or ulceration. CHEST:  Lungs are clear.  Hickman site is pink. CARDIOVASCULAR:  Irregularly irregular.  No JVD. ABDOMEN:  Soft and nontender.  No organomegaly. EXTREMITIES:  Trace lower leg edema bilaterally. NEURO:  Alert and oriented.  Motor strength 5/5.  DTRs are 1+, symmetric.  Mild decrease in vibratory sense over the fingertips.  LABORATORY DATA:  May 03, 2010:  Hemoglobin 10.9, white count 7.8, absolute neutrophil count 6.1, platelet count 256,000.  IMPRESSION: 1. High-grade B-cell non-Hodgkin's lymphoma status post 5 cycles of     Rituxan and Epoch. 2. Recent onset atrial fibrillation.  He is currently on Toprol XL 50     mg twice daily.  Coumadin anticoagulation was initiated May 03, 2010. 3. Insulin-dependent diabetes. 4. Hypertension. 5. Asymptomatic coronary artery disease. 6. Obesity/sleep apnea syndrome.  PLAN: 1. Admit for the 6th and final cycle of Epoch.  He received cycle 6     Rituxan as an outpatient on May 05, 2010. 2. Cardiology  consult for followup of atrial fibrillation. 3. Add Zinecard.  Justin Ruiz has recent onset of atrial fibrillation.  He is hemodynamically stable.  Per Cardiology recommendation, it is okay to proceed with chemotherapy.  We will obtain a Cardiology consult while he is in the hospital.  Patient interviewed and examined by Dr. Cyndie Chime.  Plan per Dr. Cyndie Chime.     Lonna Cobb, N.P.   ______________________________ Genene Churn. Cyndie Chime, M.D.    LT/MEDQ  D:  05/05/2010  T:  05/05/2010  Job:  [id number]  Electronically Signed by Lonna Cobb N.P. on 05/14/2010 12:42:01 PM Electronically Signed by Cephas Darby M.D. on 05/21/2010 08:35:37 AM

## 2010-05-22 LAB — BASIC METABOLIC PANEL
Chloride: 102 mEq/L (ref 96–112)
Creatinine, Ser: 0.74 mg/dL (ref 0.4–1.5)
GFR calc Af Amer: >60 mL/min (ref >60)
GFR calc non Af Amer: >60 mL/min (ref >60)
Potassium: 3.7 mEq/L (ref 3.5–5.1)

## 2010-05-22 LAB — PROTIME-INR
INR: 1.95 — ABNORMAL HIGH (ref 0.00–1.49)
Prothrombin Time: 22.4 seconds — ABNORMAL HIGH (ref 11.6–15.2)

## 2010-05-22 LAB — GLUCOSE, CAPILLARY: Glucose-Capillary: 185 mg/dL — ABNORMAL HIGH (ref 70–99)

## 2010-05-22 LAB — DIGOXIN LEVEL: Digoxin Level: 0.3 ng/mL — ABNORMAL LOW (ref 0.8–2.0)

## 2010-05-24 LAB — CULTURE, BLOOD (ROUTINE X 2)
Culture  Setup Time: 201201240021
Culture: NO GROWTH

## 2010-05-24 NOTE — H&P (Signed)
NAME:  Justin Ruiz, Justin Ruiz NO.:  [id number]  MEDICAL RECORD NO.:  [id number]          PATIENT TYPE:  INP  LOCATION:  1519                         FACILITY:  [facility]  PHYSICIAN:  Leighton Roach. Truett Perna, M.D. DATE OF BIRTH:  07-Apr-1951  DATE OF ADMISSION:  05/17/2010 DATE OF DISCHARGE:                             HISTORY & PHYSICAL   CHIEF COMPLAINT:  Fever, diarrhea, abdominal and rectal pain.  HISTORY OF PRESENT ILLNESS:  Justin Ruiz is a 60 year old man diagnosed with stage IA high-grade B-cell non-Hodgkin's lymphoma in September 2011.  CT scan was done to follow up abnormal chest x-ray and incidentally showed retroperitoneal lymphadenopathy.  Needle core biopsy of an area of retroperitoneal adenopathy on January 04, 2010, showed a high-grade B-cell non-Hodgkin's lymphoma which appeared aggressive and simulated Burkitt lymphoma.  He began treatment with [facility] on January 21, 2010.  He developed a severe ileus due to vincristine following the first cycle.  The vincristine was dose reduced by 50% with a cycles 2 and 3 with no recurrence of the ileus.  Restaging CT evaluation after 3 cycles showed significant regression of tumor.  He began cycle 4 EPOCH on March 24, 2010.  Chemotherapy doses were escalated per protocol with cycle 4.  He was admitted for cycle 5 on April 15, 2010.  He was found to be in atrial fibrillation, new onset.  He was asymptomatic and normotensive.  An echocardiogram was technically difficult and the exact ejection fraction could not be calculated.  Global left ventricular wall function appeared normal.  He was started on a beta blocker for rate control.  He was found to be back in atrial fibrillation at time of prehospital evaluation on May 03, 2010, and the Toprol XL dose was adjusted.  He completed cycle 6 of Rituxan on May 05, 2010.  He was admitted for cycle 6 [facility] on May 06, 2010 with discharge home on May 10, 2010.  He received a Neulasta injection on May 11, 2010.  Justin Ruiz presents today with report of a fever of 100.6 degrees last night.  He denies shaking chills.  He has had no fever today.  For the past 3-4 days, he has been having diarrhea.  He estimates 3-4 small volume loose stools per day.  He denies any rectal bleeding.  He is having significant rectal pain which he attributes to "internal hemorrhoids."  He is having intermittent low abdominal cramping.  He is concerned that he has a yeast infection at the tip of his penis.  He is not circumcised.  Overall, he feels weak.  He denies any nausea or vomiting.  He has stable dyspnea on exertion.  No cough.  He denies any unusual headache or vision change.  PAST MEDICAL HISTORY: 1. Hypertension. 2. Asymptomatic CAD. 3. Insulin-dependent diabetes. 4. Obesity. 5. Sleep apnea syndrome. 6. Obstructive airway disease. 7. Atrial fibrillation.  MEDICATIONS: 1. Albuterol metered dose inhaler, 2 puffs every 4 hours as needed. 2. Metformin 500 mg 3 times a day. 3. Zocor 40 mg daily. 4. Aspirin 81 mg daily. 5. Humalog 75/25 insulin 54 units twice daily. 6.  Vitamin D 50,000 international units weekly. 7. Septra DS 1 tablet on Monday, Wednesday, Friday. 8. Mycelex Troche 10 mg 4 times a day as needed. 9. Benadryl 25 mg twice a day as needed. 10.Naphcon eyedrops 1 drop twice a day as needed. 11.Digoxin 0.25 mg daily. 12.Enalapril 10 mg daily. 13.Toprol XL 200 mg daily. 14.Coumadin 5 mg daily.  ALLERGIES:  No known drug allergies.  FAMILY HISTORY:  Mother died at age 29 of liver cancer.  Father died at age 86 with complications of prostate and colon cancer.  Half sister age 41 with cardiac disease and history of MI.  One sister died of sudden death at age 70.  One sister died of throat cancer, possibly aneurysm at age 58.  One sister died of a probable MI in her 16s.  SOCIAL HISTORY:  Justin Ruiz is married.  He has 2  stepchildren from his current marriage.  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  One half to one pack per day cigarette smoker.  He quit smoking 4 years ago.  No alcohol use.  REVIEW OF SYSTEMS:  Per HPI.  PHYSICAL EXAM:  VITAL SIGNS:  Temperature 98.9, heart rate 106, respirations 20, blood pressure 96/62. GENERAL:  Pleasant male in no acute distress. HEENT:  Alopecia.  Pupils equal, round, reactive to light.  Extraocular movements are intact.  Sclerae anicteric.  Oropharynx is without thrush or ulceration. CHEST:  Faint wheezes at the lung bases.  Hickman site is pink, nontender. CARDIOVASCULAR:  Regular rate and rhythm. ABDOMEN:  Soft and nontender. RECTAL:  No external hemorrhoids.  He is markedly tender at the anal verge. GU:  Superficial ulceration penis/foreskin. EXTREMITIES:  No edema. NEURO:  Nonfocal.  LABS:  Hemoglobin 8.7, white count 0.3, absolute neutrophil count 0.0, platelet count 15,000.  Sodium 133, potassium 4.4, BUN 20, creatinine 1.02, glucose 271, calcium 8.1.  IMPRESSION: 1. High-grade non-Hodgkin's lymphoma status post cycle 6 [facility]      May 06, 2010 (Rituxan given on May 05, 2010).  He received     Neulasta support. 2. Fever/neutropenia. 3. Thrombocytopenia. 4. Diarrhea. 5. Rectal pain and abdominal cramps. 6. Insulin-dependent diabetes. 7. Atrial fibrillation. 8. Anticoagulation, on Coumadin. 9. Obstructive airway disease. 10.Sleep apnea. 11.Coronary artery disease. 12.History of hypertension - hypotensive in the office today.  PLAN: 1. Obtain cultures (blood, urine, and stool for C-difficile), chest x-     ray. 2. Begin antibiotics. 3. Daily PT/INR. 4. Add sliding-scale insulin. 5. Check platelets on May 18, 2010 and transfuse for a count     of less than 10,000.  Justin Ruiz is being admitted with febrile neutropenia.  The source for infection is unclear; question Hickman  catheter, question rectal abscess.  The patient is interviewed and examined by Dr. Truett Perna; plan per Dr. Truett Perna.     Lonna Cobb, N.P.   ______________________________ Leighton Roach Truett Perna, M.D.    LT/MEDQ  D:  05/17/2010  T:  05/17/2010  Job:  [id number]  cc:   Rollene Rotunda, MD, [facility] [address] [place] [address]  [name], M.D. Fax: [phone]  Clinton D. Maple Hudson, MD, FCCP, FACP [facility] [address] [place] [address]  Electronically Signed by Lonna Cobb N.P. on 05/20/2010 01:26:38 PM Electronically Signed by Thornton Papas M.D. on 05/24/2010 02:17:19 PM

## 2010-05-25 ENCOUNTER — Encounter: Payer: Self-pay | Admitting: Cardiology

## 2010-05-25 ENCOUNTER — Other Ambulatory Visit: Payer: Self-pay | Admitting: Oncology

## 2010-05-25 DIAGNOSIS — C8583 Other specified types of non-Hodgkin lymphoma, intra-abdominal lymph nodes: Secondary | ICD-10-CM

## 2010-05-25 DIAGNOSIS — C8590 Non-Hodgkin lymphoma, unspecified, unspecified site: Secondary | ICD-10-CM

## 2010-05-25 LAB — COMPREHENSIVE METABOLIC PANEL
ALT: 13 U/L (ref 0–53)
CO2: 33 mEq/L — ABNORMAL HIGH (ref 19–32)
Creatinine, Ser: 0.77 mg/dL (ref 0.40–1.50)
Glucose, Bld: 309 mg/dL — ABNORMAL HIGH (ref 70–99)
Total Bilirubin: 0.5 mg/dL (ref 0.3–1.2)

## 2010-05-25 LAB — CBC WITH DIFFERENTIAL/PLATELET
BASO%: 0.3 % (ref 0.0–2.0)
EOS%: 0.1 % (ref 0.0–7.0)
HCT: 33 % — ABNORMAL LOW (ref 38.4–49.9)
MCH: 31 pg (ref 27.2–33.4)
MCHC: 34.1 g/dL (ref 32.0–36.0)
NEUT%: 83.8 % — ABNORMAL HIGH (ref 39.0–75.0)
lymph#: 1.3 10*3/uL (ref 0.9–3.3)

## 2010-05-25 LAB — PROTIME-INR

## 2010-05-25 LAB — LACTATE DEHYDROGENASE: LDH: 275 U/L — ABNORMAL HIGH (ref 94–250)

## 2010-05-26 ENCOUNTER — Encounter: Payer: Self-pay | Admitting: Oncology

## 2010-05-26 ENCOUNTER — Other Ambulatory Visit: Payer: Self-pay | Admitting: Oncology

## 2010-05-26 DIAGNOSIS — C8590 Non-Hodgkin lymphoma, unspecified, unspecified site: Secondary | ICD-10-CM

## 2010-05-27 NOTE — Assessment & Plan Note (Signed)
Summary: [facility]   Visit Type:  Follow-up Primary Provider:  Rudi Heap, MD  CC:  Atrial fibrillation.  History of Present Illness: The patient presents for evaluation newly discovered atrial fibrillation. This was discovered at the oncology Center he presented for management of his lymphoma. We increased his metoprolol at that time and he has been started on Coumadin. When he was admitted recently for chemotherapy he was apparently in sinus rhythm. She does not notice any tachypalpitations. He has had no presyncope or syncope. He has felt fatigued but has related this to his chemotherapy. He denies any chest pressure, neck or arm discomfort. He has had no new shortness of breath, PND or orthopnea. He has gained weight with steroids he was taking but he has had no new edema.  Current Medications (verified): 1)  Metoprolol Tartrate 50 Mg Tabs (Metoprolol Tartrate) .Marland Kitchen.. 1 By Mouth Two Times A Day 2)  Simvastatin 40 Mg  Tabs (Simvastatin) .... Take 1 Tablet By Mouth Once A Day 3)  Aspirin 81 Mg Tbec (Aspirin) .... Take One Tablet By Mouth Daily 4)  Ipratropium-Albuterol 0.5-2.5 (3) Mg/10ml  Soln (Ipratropium-Albuterol) .Marland Kitchen.. 1 Neb Four Times A Day Prn 5)  Vitamin D3 50000 Unit Caps (Cholecalciferol) .Marland Kitchen.. 1 By Mouth Weekly 6)  Metformin Hcl 500 Mg Tabs (Metformin Hcl) .... Take 1 Tablet By Mouth Three Times A Day 7)  Humalog Mix 75/25 75-25 % Susp (Insulin Lispro Prot & Lispro) .... Am- 55 Units, Pm 48 Units Subcutaneously 8)  Biaxin 500 Mg Tabs (Clarithromycin) .... Take 1 Tablet By Mouth Two Times A Day 9)  Benadryl 25 Mg Caps (Diphenhydramine Hcl) .... As Needed 10)  Coumadin 5 Mg Tabs (Warfarin Sodium) .... Uad  Allergies (verified): No Known Drug Allergies  Past History:  Past Medical History: Allergic Rhinitis C O P D Diabetes, Type 2 x 5 years G E R D Hyperlipidemia x 5 years Exogenous obesity HX of colon polyps Obesity Arthritis Asthma Hypertension x 3  years Pneumonia Atrial fibrillation  Past Surgical History: Reviewed history from 09/01/2008 and no changes required. Appendectomy  Review of Systems       As stated in the HPI and negative for all other systems.   Vital Signs:  Patient profile:   60 year old male Height:      71 inches Weight:      305 pounds BMI:     42.69 Pulse rate:   102 / minute Resp:     18 per minute BP sitting:   142 / 70  (right arm)  Vitals Entered By: Marrion Coy, CNA (May 12, 2010 2:16 PM)  Physical Exam  General:  Well developed, well nourished, in no acute distress. Head:  normocephalic and atraumatic Eyes:  PERRLA/EOM intact; conjunctiva and lids normal. Mouth:  upper and lower dentures. Oral mucosa normal. Neck:  Neck supple, no JVD. No masses, thyromegaly or abnormal cervical nodes. Chest Wall:  no deformities or breast masses noted. right upper chest [place]man Lungs:  Clear bilaterally to auscultation and percussion. Abdomen:  Bowel sounds positive; abdomen soft and non-tender without masses, organomegaly, or hernias noted. No hepatosplenomegaly, obese Msk:  Back normal, normal gait. Muscle strength and tone normal. Extremities:  No clubbing or cyanosis. Neurologic:  Alert and oriented x 3. Skin:  Intact without lesions or rashes. Cervical Nodes:  no significant adenopathy Inguinal Nodes:  no significant adenopathy Psych:  Normal affect.   Detailed Cardiovascular Exam  Neck    Carotids: Carotids full  and equal bilaterally without bruits.      Neck Veins: Normal, no JVD.    Heart    Inspection: no deformities or lifts noted.      Palpation: normal PMI with no thrills palpable.      Auscultation: irregular rate and rhythm, S1, S2 without murmurs, rubs, gallops, or clicks.    Vascular    Abdominal Aorta: no palpable masses, pulsations, or audible bruits.      Femoral Pulses: normal femoral pulses bilaterally.      Pedal Pulses: normal pedal pulses bilaterally.      Radial  Pulses: normal radial pulses bilaterally.      Peripheral Circulation: no clubbing, cyanosis, or edema noted with normal capillary refill.     EKG  Procedure date:  05/12/2010  Findings:      Atrial fibrillation with rapid ventricular rate and nonspecific ST-T wave changes  Impression & Recommendations:  Problem # 1:  FIBRILLATION, ATRIAL (ICD-427.31)  Today he is back in atrial fib.  I am going to add digoxin and increase his Metoprolol to 200 XL daily.  I reviewed recent blood pressures and heart rates in sinus rhythm and I believe he can tolerate this. He will start digoxin 0.25. I will see him next week to followup and plan in the near future an event monitor. Of note his echocardiogram in December demonstrated very poor acoustic windows but no obvious cardiomyopathy. At currently has no symptoms. He will have his Coumadin checked at the cancer Center next week but these can be followed in the future with his primary physician.  Orders: EKG w/ Interpretation (93000)  Problem # 2:  CARDIOVASCULAR STUDIES, ABNORMAL (ICD-794.30) He is having no chest pain and no change in therapy is indicated.  Problem # 3:  OBESITY, UNSPECIFIED (ICD-278.00) He hopefully will lose weight as he recovers from recent therapy.  Patient Instructions: 1)  Your physician recommends that you schedule a follow-up appointment in: 1 week 2)  Your physician has recommended you make the following change in your medication: Stop metoprolol 50 and start Metoprolol 200 mg once a day, start Digoxin once a day Prescriptions: DIGOXIN 0.25 MG TABS (DIGOXIN) one daily  #90 x 3   Entered by:   Charolotte Capuchin, RN   Authorized by:   Rollene Rotunda, MD, [facility]   Signed by:   Charolotte Capuchin, RN on 05/12/2010   Method used:   Electronically to        [facility]  [address] (retail)       [address]       [place], [place]  [address]       Ph: [phone]       Fax: [phone]   RxID:    [id number] METOPROLOL SUCCINATE 200 MG XR24H-TAB (METOPROLOL SUCCINATE) once daily  #90 x 3   Entered by:   Charolotte Capuchin, RN   Authorized by:   Rollene Rotunda, MD, [facility]   Signed by:   Charolotte Capuchin, RN on 05/12/2010   Method used:   Electronically to        [facility]  [address] (retail)       [address]       [place], [place]  [address]       Ph: [phone]       Fax: [phone]   RxID:   [id number]

## 2010-05-27 NOTE — Progress Notes (Signed)
Summary: Back in A Fib  Phone Note Other Incoming   Caller: Dr Cyndie Chime Details for Reason: Discuss pt Summary of Call: Dr Antoine Poche spoke with Dr Cyndie Chime about the above pt who is back in At Fib.  Pt's Metoprolol XL was doubled and he was restarted on Coumadin.  Pt needs a cardiology consult while at [facility] on Thursday. Initial call taken by: Charolotte Capuchin, RN,  May 03, 2010 9:58 AM     Appended Document: Back in A Fib The patient was also started on warfarin.

## 2010-05-27 NOTE — Assessment & Plan Note (Signed)
Summary: np6/abn stress test/jml   Visit Type:  Initial Consult Primary Provider:  Rudi Heap, MD  CC:  Abnormal Stress Test.  History of Present Illness: The patient presents for evaluation of an abnormal exercise treadmill test. I saw him several years ago for the same issue. However, I thought the exercise treadmill test was nonspecific and did not suggest further evaluation. Since that time the patient has been disabled secondary to COPD. He is relatively sedentary. He does very minimal walking. With this he will get dyspneic. He does not get chest pressure, neck or arm discomfort. He does not describe resting shortness of breath, PND or orthopnea. He does not have palpitations, presyncope or syncope.  I was able to obtain his exercise treadmill test. Despite his limitations he was able to exercise to a target heart rate. He exercised for 4 minutes and 33 seconds. He did get 1/2 mm ST segment depression in the inferior and lateral leads that resolved with recovery. He was dyspneic but did not have chest discomfort.   Preventive Screening-Counseling & Management  Alcohol-Tobacco     Smoking Status: quit > 6 months  Comments: Pt states he smoked for about 40 yrs but quit about 2 1/2-3 yrs ago.  Current Medications (verified): 1)  Enalapril Maleate 20 Mg  Tabs (Enalapril Maleate) .... Take 1 Tablet By Mouth Two Times A Day 2)  Simvastatin 40 Mg  Tabs (Simvastatin) .... Take 1 Tablet By Mouth Once A Day 3)  Aspirin 81 Mg Tbec (Aspirin) .... Take One Tablet By Mouth Daily 4)  Ipratropium-Albuterol 0.5-2.5 (3) Mg/78ml  Soln (Ipratropium-Albuterol) .Marland Kitchen.. 1 Neb Four Times A Day Prn 5)  Vitamin D 2000 Unit Tabs (Cholecalciferol) .... Take 1 Tablet By Mouth Once A Day 6)  Metformin Hcl 500 Mg Tabs (Metformin Hcl) .... Take 1 Tablet By Mouth Three Times A Day 7)  Humalog Mix 75/25 75-25 % Susp (Insulin Lispro Prot & Lispro) .... Am- 55 Units, Pm 48 Units Subcutaneously  Allergies  (verified): No Known Drug Allergies  Comments:  Nurse/Medical Assistant: The patient's medications were reviewed with the patient and were updated in the Medication List. Pt did not bring medication list or bottles but verbally confirmed meds.  Past History:  Past Medical History: Allergic Rhinitis C O P D Diabetes, Type 2 x 5 years G E R D Hyperlipidemia x 5 years Exogenous obesity HX of colon polyps Obesity Arthritis Asthma Hypertension x 3 years Pneumonia  Past Surgical History: Reviewed history from 09/01/2008 and no changes required. 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   Vital Signs:  Patient profile:   60 year old male Height:      71 inches Weight:      308.75 pounds Pulse rate:   90 / minute BP sitting:   120 / 79  (left arm) Cuff size:   large  Vitals Entered By: Cyril Loosen, RN, BSN (April 27, 2009 3:21 PM) CC: Abnormal Stress  Test Comments Evaluation of abnormal stress test ordered by primary MD    Physical Exam  General:  Well developed, well nourished, in no acute distress. Head:  normocephalic and atraumatic Eyes:  PERRLA/EOM intact; conjunctiva and lids normal. Mouth:  Teeth, gums and palate normal. Oral mucosa normal. Neck:  Neck supple, no JVD. No masses, thyromegaly or abnormal cervical nodes. Chest Wall:  no deformities or breast masses noted Lungs:  Clear bilaterally to auscultation and  percussion. Abdomen:  Bowel sounds positive; abdomen soft and non-tender without masses, organomegaly, or hernias noted. No hepatosplenomegaly,  Msk:  Back normal, normal gait. Muscle strength and tone normal. Extremities:  No clubbing or cyanosis. Neurologic:  Alert and oriented x 3. Skin:  Intact without lesions or rashes. Cervical Nodes:  no significant adenopathy Axillary Nodes:  no significant adenopathy Inguinal Nodes:  no significant adenopathy Psych:  Normal affect.   Detailed Cardiovascular Exam  Neck    Carotids: Carotids full and equal bilaterally without bruits.      Neck Veins: Normal, no JVD.    Heart    Inspection: no deformities or lifts noted.      Palpation: normal PMI with no thrills palpable.      Auscultation: regular rate and rhythm, S1, S2 without murmurs, rubs, gallops, or clicks.    Vascular    Abdominal Aorta: no palpable masses, pulsations, or audible bruits.      Femoral Pulses: normal femoral pulses bilaterally.      Pedal Pulses: normal pedal pulses bilaterally.      Radial Pulses: normal radial pulses bilaterally.      Peripheral Circulation: no clubbing, cyanosis, or edema noted with normal capillary refill.     EKG  Procedure date:  04/27/2009  Findings:      sinus rhythm, rate 90, axis within normal limits, intervals within normal limits, nonspecific inferolateral T wave flattening.  Impression & Recommendations:  Problem # 1:  ABNORMAL STRESS ELECTROCARDIOGRAM (ICD-794.31) The patient had an abnormal stress test as reported.he has significant cardiovascular risk factors with in particular a very strong family history. Given this stress perfusion imaging is indicated. He also needs aggressive risk reduction.  Problem # 2:  ESSENTIAL HYPERTENSION, BENIGN (ICD-401.1) His blood pressure is controlled and he will continue the meds as listed.  Problem # 3:  OBESITY, UNSPECIFIED (ICD-278.00) He has lost a few pounds. However, I had a long  discussion with the patient about weight loss strategies to include calorie counting and exercise. He understands the importance of this and hopefully can comply.  Problem # 4:  HYPERLIPIDEMIA (ICD-272.4) Per his primary M.D. Goals will be based on presence or absence of coronary artery disease with the upcoming study.  Other Orders: EKG w/ Interpretation (93000) Nuclear Med (Nuc Med)  Patient Instructions: 1)  Your physician recommends that you schedule a follow-up appt based on the results of your stress cardiolite. 2)  Your physician has requested that you have an exercise stress myoview.  For further information please visit [email or web address]  Please follow instruction sheet, as given. 3)  Your physician recommends that you continue on your current medications as directed. Please refer to the Current Medication list given to you today.

## 2010-05-27 NOTE — Progress Notes (Signed)
Summary: rx  Phone Note Call from Patient Call back at Home Phone [phone]   Caller: Spouse Call For: young Reason for Call: Talk to Nurse Summary of Call: cough, rattling in chest , not sure if it is aninfection?  Has had some fever.  Can you call in antibiotic?  Also need Ipatropium /Albuterol refill sent to [facility] also.  Have changed pharmacies. Justin Ruiz Excello [phone] Initial call taken by: Eugene Gavia,  May 08, 2009 10:44 AM  Follow-up for Phone Call        called, spoke with pt.  Pt c/o chills, body aches, throat burning, chest rattling, wheezing, and cough with thick yellowish green mucus x3days.  denies chest tightness and increase in SOB. Requesting abx to be called in to pharmacy.  Also requesting refill for ipatropium-albuterol.  rx sent to [facility] aware. Will forward message to CY-please advise. Thanks! [facility] Follow-up by: Gweneth Dimitri RN,  May 08, 2009 11:09 AM  Additional Follow-up for Phone Call Additional follow up Details #1::        Per CDY- give Biaxin 500mg  #14 take 1 by mouth two times a day no refills. Reynaldo Minium CMA  May 08, 2009 12:13 PM     Additional Follow-up for Phone Call Additional follow up Details #2::    called, spoke with pt.  Pt informed biaxin was sent to pharmacy as stated above per CY.  Pt states he was given rx for proair at last ov but he has lost it-requesting proair to be sent to pharmacy.  Proair sent-pt aware.  Pt verbalized understanding of instructions.  Follow-up by: Gweneth Dimitri RN,  May 08, 2009 12:24 PM  New/Updated Medications: BIAXIN 500 MG TABS (CLARITHROMYCIN) take 1 tablet by mouth two times a day PROAIR HFA 108 (90 BASE) MCG/ACT AERS (ALBUTEROL SULFATE) 2 puffs 4 x daily as needed Prescriptions: PROAIR HFA 108 (90 BASE) MCG/ACT AERS (ALBUTEROL SULFATE) 2 puffs 4 x daily as needed  #1 x 3   Entered by:   Gweneth Dimitri RN   Authorized by:   Waymon Budge MD   Signed by:    Gweneth Dimitri RN on 05/08/2009   Method used:   Electronically to        [facility]* (retail)       [address]       [place], [place]  [address]       Ph: [phone]       Fax: [phone]   RxID:   [id number] BIAXIN 500 MG TABS (CLARITHROMYCIN) take 1 tablet by mouth two times a day  #14 x 0   Entered by:   Gweneth Dimitri RN   Authorized by:   Waymon Budge MD   Signed by:   Gweneth Dimitri RN on 05/08/2009   Method used:   Electronically to        [facility]* (retail)       [address]       [place], [place]  [address]       Ph: [phone]       Fax: [phone]   RxID:   [id number] IPRATROPIUM-ALBUTEROL 0.5-2.5 (3) MG/3ML  SOLN (IPRATROPIUM-ALBUTEROL) 1 neb four times a day prn  #50 x 3   Entered by:   Gweneth Dimitri RN   Authorized by:   Waymon Budge MD   Signed by:   Gweneth Dimitri RN on 05/08/2009  Method used:   Electronically to        [facility]* (retail)       [address] [place], [place]  [address]       Ph: [phone]       Fax: [phone]   RxID:   [id number]

## 2010-05-27 NOTE — Progress Notes (Signed)
Summary: re orders for oxygen  Phone Note From Other Clinic   Caller: Patient Caller: cone [phone] Summary of Call: misty has questions re order for oxygen-pls call Initial call taken by: Glynda Jaeger,  April 16, 2010 2:50 PM  Follow-up for Phone Call        03/17/10--1500pm--misty from [place] calling about an order dr bensimhon wrote for pt to wear pulse ox overnight--advised i will give you dr bensimhon's pager # and you may page him--misty agrees--pager given--nt Follow-up by: Ledon Snare, RN,  April 16, 2010 3:14 PM

## 2010-05-27 NOTE — Letter (Signed)
Summary: Pharmacist, community at [facility]. [address]   [place], [place] [address]   Phone: [phone]  Fax: [phone]      [facility] Cardiovascular Services  Cardiolite Stress Test     [place]  Your doctor has ordered a Cardiolite Stress Test to help determine the condition of your heart during stress. If you take blood pressure medicine, ask your doctor if you should take it the day of your test. You should not have anything to eat or drink at least 4 hours before your test is scheduled, and no caffeine (coffee, tea, decaf. or chocolate) for 24 hours before your test.   You will need to register at the Outpatient/Main Entrance at the hospital 30 minutes before your appointment time. It is a good idea to bring a copy of your order with you. They will direct you to the Diagnostic Imaging (Radiology) Department.  You will be asked to undress from the waist up and be given a hospital gown to wear, so dress comfortably from the waist down, for example:    Sweat pants, shorts or skirt   Rubber-soled lace up shoes (i.e. tennis shoes)  Plan on about three hours from registration to release from the hospital.    Date of Test:              Time of Test

## 2010-05-27 NOTE — Miscellaneous (Signed)
  Clinical Lists Changes  Observations: Added new observation of NUCST CONC: Comments :             1. This study was performed using the Standard Bruce exercise protocol. The patient exercised into stage 1 reaching 151 bpm, 93% MPHR. Maximum METs of 4.6 were achieved. Sinus tachycardia, nonspecific ST/T changes noted at baseline. The patient experienced no chest pain. No diagnostic ST changes, rare PVC with a hypertensive response to stress. An adequate level of stress was achieved although functional capacity is poor.  2. Mildly abnormal LV perfusion. Limitations and artifact were due to low counts as well as diaphragm or other soft tissue. There was a small, partially reversible defect in the mid inferolateral, apical lateral segment(s). The degree of photon reduction was mild. Likely component of soft tissue attenuation, although mild ischemia in this region cannot be excluded. The patient's calculated post stress LVEF was 70%. TID Ratio normal at 0.85.       (05/08/2009 12:26)       Nuclear ETT  Procedure date:  05/08/2009  Findings:      Comments :             1. This study was performed using the Standard Bruce exercise protocol. The patient exercised into stage 1 reaching 151 bpm, 93% MPHR. Maximum METs of 4.6 were achieved. Sinus tachycardia, nonspecific ST/T changes noted at baseline. The patient experienced no chest pain. No diagnostic ST changes, rare PVC with a hypertensive response to stress. An adequate level of stress was achieved although functional capacity is poor.  2. Mildly abnormal LV perfusion. Limitations and artifact were due to low counts as well as diaphragm or other soft tissue. There was a small, partially reversible defect in the mid inferolateral, apical lateral segment(s). The degree of photon reduction was mild. Likely component of soft tissue attenuation, although mild ischemia in this region cannot be excluded. The patient's calculated post stress LVEF was  70%. TID Ratio normal at 0.85.        Appended Document:  Low risk scan.  I would follow this clinically.  Needs continued risk reduction.  Appended Document:  Patient informed of the above.

## 2010-05-27 NOTE — Letter (Signed)
Summary: Internal Other/ PATIENT HISTORY FORM  Internal Other/ PATIENT HISTORY FORM   Imported By: Dorise Hiss 04/29/2009 14:33:34  _____________________________________________________________________  External Attachment:    Type:   Image     Comment:   External Document

## 2010-06-02 ENCOUNTER — Ambulatory Visit (INDEPENDENT_AMBULATORY_CARE_PROVIDER_SITE_OTHER): Payer: Medicare Other | Admitting: Cardiology

## 2010-06-02 ENCOUNTER — Encounter: Payer: Self-pay | Admitting: Cardiology

## 2010-06-02 DIAGNOSIS — I4891 Unspecified atrial fibrillation: Secondary | ICD-10-CM

## 2010-06-03 ENCOUNTER — Encounter: Payer: Self-pay | Admitting: Cardiology

## 2010-06-03 ENCOUNTER — Encounter (HOSPITAL_BASED_OUTPATIENT_CLINIC_OR_DEPARTMENT_OTHER): Payer: Medicare Other | Admitting: Oncology

## 2010-06-03 ENCOUNTER — Encounter (HOSPITAL_COMMUNITY)
Admission: RE | Admit: 2010-06-03 | Discharge: 2010-06-03 | Disposition: A | Discharge status: Home / Self Care | Payer: Medicare Other | Source: Ambulatory Visit | Attending: Oncology | Admitting: Oncology

## 2010-06-03 DIAGNOSIS — C8590 Non-Hodgkin lymphoma, unspecified, unspecified site: Secondary | ICD-10-CM

## 2010-06-03 DIAGNOSIS — C8583 Other specified types of non-Hodgkin lymphoma, intra-abdominal lymph nodes: Secondary | ICD-10-CM

## 2010-06-03 DIAGNOSIS — Z452 Encounter for adjustment and management of vascular access device: Secondary | ICD-10-CM

## 2010-06-03 DIAGNOSIS — E119 Type 2 diabetes mellitus without complications: Secondary | ICD-10-CM | POA: Insufficient documentation

## 2010-06-03 DIAGNOSIS — C8589 Other specified types of non-Hodgkin lymphoma, extranodal and solid organ sites: Secondary | ICD-10-CM | POA: Insufficient documentation

## 2010-06-03 LAB — GLUCOSE, CAPILLARY: Glucose-Capillary: 306 mg/dL — ABNORMAL HIGH (ref 70–99)

## 2010-06-04 NOTE — Discharge Summary (Signed)
NAME:  Justin Ruiz, Justin Ruiz NO.:  [id number]  MEDICAL RECORD NO.:  [id number]          PATIENT TYPE:  INP  LOCATION:  1408                         FACILITY:  [facility]  PHYSICIAN:  Genene Churn. Jayton Popelka, M.D.DATE OF BIRTH:  [birth year]  DATE OF ADMISSION:  05/17/2010 DATE OF DISCHARGE:  05/22/2010                              DISCHARGE SUMMARY   HISTORY OF PRESENT ILLNESS:  Mr. Storey is a pleasant 60 year old man with insulin-dependent diabetes, sleep apnea syndrome, and hypertension. He was diagnosed with high-grade B-cell non-Hodgkin lymphoma when a CT scan of the chest done to further evaluate an abnormal chest x-ray unexpectedly showed retroperitoneal and left perinephric lymphadenopathy.  Please see multiple recent histories, physicals, and discharge summaries for full details.  He has been on treatment with a chemo/immunotherapy program implying Cytoxan, Adriamycin, vincristine, prednisone, etoposide, and Rituxan. He recently received his 6th and final planned cycle on May 06, 2010.  He presented on the day of the current admission with a fever of 100.6 degrees, sudden onset of diarrhea, rectal pain, and was initially evaluated in our office where he was found to be hypotensive with laboratory studies showing marked pancytopenia with absolute granulocytopenia and thrombocytopenia.  He was admitted for further evaluation and treatment.  INITIAL EXAMINATION:  GENERAL:  A moderately obese Caucasian man in no acute distress.  Pertinent physical findings included a Hickman-type catheter, right subclavian position with some erythema, but no exudate at the entry site.  No tenderness. VITAL SIGNS:  Pulse 106, regular; respirations 20; temperature 98.9; blood pressure 96/62. HEART:  No cardiac murmurs.  Regular cardiac rhythm. LUNGS:  Some scattered wheezes at the bases.  No cardiac murmurs.  No abdominal tenderness. PERIRECTAL:  Marked tenderness.  There was  superficial ulceration of the penis/foreskin.  HOSPITAL COURSE:  Cultures were obtained.  He was started on broad- spectrum parenteral antibiotics.  Antibiotic choices selected to cover anaerobic bacteria in view of the rectal pain with potential of rectal abscess due to the neutropenia.  He was given aggressive fluid resuscitation.  Of note, his blood pressures have been running low lately due to the new onset of atrial fibrillation and necessity for rather high-dose beta-blocker therapy with Toprol-XL dose just increased prior to this admission to 200 mg daily.  A stool PCR was sent for C difficile and did return positive.  He was initially treated with Primaxin and parenteral vancomycin.  Oral vancomycin added when the C difficile test was available.  He responded nicely to the treatment outlined above.  His Toprol was initially held in view of the hypotension and then resumed at a lower dose as his blood pressure stabilized with fluid resuscitation.  He did not require any pressor drugs.  Fortunately, his blood counts recovered rapidly.  He did require initial blood and platelet transfusion support. Unfortunately, his final laboratory reports have not been scanned into the computer.  However, as I recall, white count normalized and rebounded to about 11,000 with normal neutrophil percentage and platelets came up to over 70,000 without need for further transfusion at the time of discharge.  Blood cultures remained and urine cultures  remained negative.  Almost as expected, with holding his beta-blocker, he went back into transient atrial fibrillation in the high rates especially when he got up from bed to go to the bathroom.  Beta-blocker dose was increased as his blood pressure stabilized and heart rate was controlled by discharge.  His glucoses were monitored and he received p.r.n. insulin in addition to his home doses of insulin.  With successful treatment of infection, his  rectal pain resolved and surgical intervention was not necessary.  A CT scan of the abdomen and pelvis done on the day of discharge showed near-complete resolution of previous adenopathy, but no further decrease in 2 areas of adenopathy since the scan done after 3 cycles of treatment.  There are bilateral subcentimeter pulmonary nodules unchanged from prior studies.  No evidence for a pelvic or rectal abscess.  CONSULTATIONS:  Critical Care Medicine.  PROCEDURES:  Blood and platelet transfusion.  DIAGNOSES AT DISCHARGE: 1. Culture negative, neutropenic sepsis. 2. Chemotherapy-induced C difficile colitis. 3. Hypotension secondary to sepsis. 4. Intermittent atrial fibrillation. 5. Insulin-dependent diabetes. 6. Essential hypertension, now hypotensive. 7. Obesity/sleep apnea syndrome.  DISPOSITION:  Condition is stable at the time of discharge.  He will resume his regular activity diabetic diet.  FOLLOWUP:  In our office in 4 days.  MEDICATIONS: 1. Digoxin 0.25 mg daily. 2. Toprol-XL 200 mg daily. 3. Coumadin 5 mg daily. 4. Cipro 500 mg p.o. b.i.d. for 5 additional days. 5. Flagyl 250 mg t.i.d. for 7 days. 6. Albuterol inhaler 2 puffs q.4 h. p.r.n. 7. Enteric-coated aspirin 81 mg daily. 8. Benadryl 25 mg 1-2 p.o. q.6 h. p.r.n. itching or sleep. 9. Eyedrops p.r.n. 10.Humalog 75/25 insulin 54 units subcutaneous b.i.d. 11.Resume Glucophage 500 mg t.i.d. with meals.     Genene Churn. Cyndie Chime, M.D.     Lottie Rater  D:  06/03/2010  T:  06/04/2010  Job:  [id number]  cc:   [name], M.D. Fax: [phone]  Rollene Rotunda, MD, [facility] [address]  [address] [place] [address]  Coralyn Helling, MD [address] [place], [place] [address]  Electronically Signed by Cephas Darby M.D. on 06/04/2010 08:51:06 AM

## 2010-06-09 ENCOUNTER — Encounter (HOSPITAL_COMMUNITY)
Admission: RE | Admit: 2010-06-09 | Discharge: 2010-06-09 | Disposition: A | Discharge status: Home / Self Care | Payer: Medicare Other | Source: Ambulatory Visit | Attending: Oncology | Admitting: Oncology

## 2010-06-09 ENCOUNTER — Encounter (HOSPITAL_COMMUNITY): Payer: Self-pay

## 2010-06-09 DIAGNOSIS — K8020 Calculus of gallbladder without cholecystitis without obstruction: Secondary | ICD-10-CM | POA: Insufficient documentation

## 2010-06-09 DIAGNOSIS — C8589 Other specified types of non-Hodgkin lymphoma, extranodal and solid organ sites: Secondary | ICD-10-CM | POA: Insufficient documentation

## 2010-06-09 LAB — GLUCOSE, CAPILLARY: Glucose-Capillary: 256 mg/dL — ABNORMAL HIGH (ref 70–99)

## 2010-06-09 MED ORDER — FLUDEOXYGLUCOSE F - 18 (FDG) INJECTION
14.8000 | Freq: Once | INTRAVENOUS | Status: AC | PRN
  Administered 2010-06-09: 14.8 via INTRAVENOUS

## 2010-06-10 ENCOUNTER — Encounter (HOSPITAL_BASED_OUTPATIENT_CLINIC_OR_DEPARTMENT_OTHER): Payer: Medicare Other | Admitting: Oncology

## 2010-06-10 DIAGNOSIS — Z452 Encounter for adjustment and management of vascular access device: Secondary | ICD-10-CM

## 2010-06-10 DIAGNOSIS — C8583 Other specified types of non-Hodgkin lymphoma, intra-abdominal lymph nodes: Secondary | ICD-10-CM

## 2010-06-10 NOTE — Assessment & Plan Note (Signed)
Summary: 2 week /eph fu /per dr Tye Vigo/mt   Visit Type:  Follow-up Primary Provider:  Rudi Heap, MD  CC:  Atrial Fibrilation.  History of Present Illness: The patient presents for followup of his atrial fibrillation. Since I last saw him he has had no new cardiovascular complaints. He was in the hospital when I last saw him for C. difficile. He actually had A. fib with a rapid rate and had his beta blocker dose increased though he did convert to sinus rhythm at one point. He now feels much better. He has no palpitations, presyncope or syncope. He is having no shortness of breath, PND or orthopnea. He is having no chest pressure, neck or arm discomfort. His having no further GI complaints.  Current Medications (verified): 1)  Simvastatin 40 Mg  Tabs (Simvastatin) .... Take 1 Tablet By Mouth Once A Day 2)  Aspirin 81 Mg Tbec (Aspirin) .... Take One Tablet By Mouth Daily 3)  Ipratropium-Albuterol 0.5-2.5 (3) Mg/40ml  Soln (Ipratropium-Albuterol) .Marland Kitchen.. 1 Neb Four Times A Day Prn 4)  Vitamin D3 50000 Unit Caps (Cholecalciferol) .Marland Kitchen.. 1 By Mouth Weekly 5)  Metformin Hcl 500 Mg Tabs (Metformin Hcl) .... Take 1 Tablet By Mouth Three Times A Day 6)  Humalog Mix 75/25 75-25 % Susp (Insulin Lispro Prot & Lispro) .... Am- 55 Units, Pm 48 Units Subcutaneously 7)  Benadryl 25 Mg Caps (Diphenhydramine Hcl) .... As Needed 8)  Coumadin 5 Mg Tabs (Warfarin Sodium) .... Uad 9)  Metoprolol Succinate 200 Mg Xr24h-Tab (Metoprolol Succinate) .... Once Daily 10)  Digoxin 0.25 Mg Tabs (Digoxin) .... One Daily  Allergies (verified): No Known Drug Allergies  Past History:  Past Medical History: Allergic Rhinitis B Cell Non Hodgkins lymphoma C O P D Diabetes, Type 2 x 5 years G E R D Hyperlipidemia x 5 years Exogenous obesity HX of colon polyps Obesity Arthritis Asthma Hypertension x 3 years Pneumonia Atrial fibrillation  Review of Systems       As stated in the HPI and negative for all other  systems.   Vital Signs:  Patient profile:   60 year old male Height:      71 inches Weight:      194 pounds BMI:     27.16 Pulse rate:   90 / minute Resp:     16 per minute BP sitting:   120 / 68  (right arm)  Vitals Entered By: Marrion Coy, CNA (June 02, 2010 10:49 AM)  Physical Exam  General:  Well developed, well nourished, in no acute distress. Head:  normocephalic and atraumatic Eyes:  PERRLA/EOM intact; conjunctiva and lids normal. Mouth:  upper and lower dentures. Oral mucosa normal. Neck:  Neck supple, no JVD. No masses, thyromegaly or abnormal cervical nodes. Chest Wall:  no deformities or breast masses noted. right upper chest Hickman Lungs:  Clear bilaterally to auscultation and percussion. Abdomen:  Bowel sounds positive; abdomen soft and non-tender without masses, organomegaly, or hernias noted. No hepatosplenomegaly, obese Msk:  Back normal, normal gait. Muscle strength and tone normal. Extremities:  No clubbing or cyanosis. Neurologic:  Alert and oriented x 3. Skin:  Intact without lesions or rashes. Cervical Nodes:  no significant adenopathy Inguinal Nodes:  no significant adenopathy Psych:  Normal affect.   Detailed Cardiovascular Exam  Neck    Carotids: Carotids full and equal bilaterally without bruits.      Neck Veins: Normal, no JVD.    Heart    Inspection: no deformities or  lifts noted.      Palpation: normal PMI with no thrills palpable.      Auscultation: irregular rate and rhythm, S1, S2 without murmurs, rubs, gallops, or clicks.    Vascular    Abdominal Aorta: no palpable masses, pulsations, or audible bruits.      Femoral Pulses: normal femoral pulses bilaterally.      Pedal Pulses: normal pedal pulses bilaterally.      Radial Pulses: normal radial pulses bilaterally.      Peripheral Circulation: no clubbing, cyanosis, or edema noted with normal capillary refill.     Impression & Recommendations:  Problem # 1:  FIBRILLATION,  ATRIAL (ICD-427.31)  Orders: EKG w/ Interpretation (93000)  Problem # 2:  ESSENTIAL HYPERTENSION, BENIGN (ICD-401.1)  His blood pressure is controlled.  His updated medication list for this problem includes:    Aspirin 81 Mg Tbec (Aspirin) .Marland Kitchen... Take one tablet by mouth daily    Metoprolol Succinate 200 Mg Xr24h-tab (Metoprolol succinate) ..... Once daily  Patient Instructions: 1)  Your physician recommends that you schedule a follow-up appointment in: 3 months with Dr Antoine Poche in [place] 2)  Your physician recommends that you continue on your current medications as directed. Please refer to the Current Medication list given to you today. 3)  Your physician has recommended that you wear a holter monitor (at [facility]).  Holter monitors are medical devices that record the heart's electrical activity. Doctors most often use these monitors to diagnose arrhythmias. Arrhythmias are problems with the speed or rhythm of the heartbeat. The monitor is a small, portable device. You can wear one while you do your normal daily activities. This is usually used to diagnose what is causing palpitations/syncope (passing out).

## 2010-06-16 NOTE — Letter (Signed)
Summary: [facility]: Office Progress Note  [facility]: Office Progress Note   Imported By: Earl Many 06/09/2010 09:03:49  _____________________________________________________________________  External Attachment:    Type:   Image     Comment:   External Document

## 2010-06-17 ENCOUNTER — Encounter (HOSPITAL_BASED_OUTPATIENT_CLINIC_OR_DEPARTMENT_OTHER): Payer: Medicare Other | Admitting: Oncology

## 2010-06-17 DIAGNOSIS — Z452 Encounter for adjustment and management of vascular access device: Secondary | ICD-10-CM

## 2010-06-17 DIAGNOSIS — C8583 Other specified types of non-Hodgkin lymphoma, intra-abdominal lymph nodes: Secondary | ICD-10-CM

## 2010-06-24 ENCOUNTER — Other Ambulatory Visit (HOSPITAL_COMMUNITY): Payer: Medicare Other

## 2010-06-25 ENCOUNTER — Telehealth (INDEPENDENT_AMBULATORY_CARE_PROVIDER_SITE_OTHER): Payer: Self-pay | Admitting: *Deleted

## 2010-06-28 ENCOUNTER — Ambulatory Visit (INDEPENDENT_AMBULATORY_CARE_PROVIDER_SITE_OTHER): Payer: Medicare Other | Admitting: Internal Medicine

## 2010-06-28 ENCOUNTER — Encounter: Payer: Self-pay | Admitting: Internal Medicine

## 2010-06-28 ENCOUNTER — Telehealth: Payer: Self-pay | Admitting: Internal Medicine

## 2010-06-28 DIAGNOSIS — J449 Chronic obstructive pulmonary disease, unspecified: Secondary | ICD-10-CM

## 2010-06-28 DIAGNOSIS — C8589 Other specified types of non-Hodgkin lymphoma, extranodal and solid organ sites: Secondary | ICD-10-CM

## 2010-06-29 ENCOUNTER — Encounter: Payer: Self-pay | Admitting: Cardiology

## 2010-07-01 NOTE — Progress Notes (Signed)
Summary: refill  Phone Note Call from Patient Call back at Home Phone [phone]   Caller: Patient Call For: young Reason for Call: Refill Medication Summary of Call: Patient needing refill--proair--[facility] [place] Initial call taken by: Lehman Prom,  June 25, 2010 2:33 PM  Follow-up for Phone Call        Spoke with pt.  Advised needs to sched appt b/c he is overdue.  He sched ov with CDY for Monday 3/5 at 11:15 am. Rx for proair sent to pharm.  Follow-up by: Vernie Murders,  June 25, 2010 3:09 PM    New/Updated Medications: PROAIR HFA 108 (90 BASE) MCG/ACT AERS (ALBUTEROL SULFATE) 2 puffs four times a day as needed Prescriptions: PROAIR HFA 108 (90 BASE) MCG/ACT AERS (ALBUTEROL SULFATE) 2 puffs four times a day as needed  #1 x 0   Entered by:   Vernie Murders   Authorized by:   Waymon Budge MD   Signed by:   Vernie Murders on 06/25/2010   Method used:   Electronically to        [facility]  [address] (retail)       [address]       [place], [place]  [address]       Ph: [phone]       Fax: [phone]   RxID:   [id number]

## 2010-07-05 LAB — BASIC METABOLIC PANEL
BUN: 15 mg/dL (ref 6–23)
BUN: 22 mg/dL (ref 6–23)
CO2: 27 mEq/L (ref 19–32)
CO2: 30 mEq/L (ref 19–32)
Calcium: 8.8 mg/dL (ref 8.4–10.5)
Chloride: 101 mEq/L (ref 96–112)
Chloride: 103 mEq/L (ref 96–112)
Creatinine, Ser: 0.91 mg/dL (ref 0.4–1.5)
Creatinine, Ser: 0.97 mg/dL (ref 0.4–1.5)
GFR calc Af Amer: >60 mL/min (ref >60)
GFR calc Af Amer: >60 mL/min (ref >60)
GFR calc non Af Amer: >60 mL/min (ref >60)
Glucose, Bld: 173 mg/dL — ABNORMAL HIGH (ref 70–99)
Potassium: 4.1 mEq/L (ref 3.5–5.1)
Potassium: 4.3 mEq/L (ref 3.5–5.1)
Sodium: 139 mEq/L (ref 135–145)

## 2010-07-05 LAB — GLUCOSE, CAPILLARY
Glucose-Capillary: 111 mg/dL — ABNORMAL HIGH (ref 70–99)
Glucose-Capillary: 164 mg/dL — ABNORMAL HIGH (ref 70–99)
Glucose-Capillary: 178 mg/dL — ABNORMAL HIGH (ref 70–99)
Glucose-Capillary: 195 mg/dL — ABNORMAL HIGH (ref 70–99)
Glucose-Capillary: 242 mg/dL — ABNORMAL HIGH (ref 70–99)
Glucose-Capillary: 251 mg/dL — ABNORMAL HIGH (ref 70–99)
Glucose-Capillary: 255 mg/dL — ABNORMAL HIGH (ref 70–99)
Glucose-Capillary: 281 mg/dL — ABNORMAL HIGH (ref 70–99)
Glucose-Capillary: 303 mg/dL — ABNORMAL HIGH (ref 70–99)
Glucose-Capillary: 326 mg/dL — ABNORMAL HIGH (ref 70–99)
Glucose-Capillary: 350 mg/dL — ABNORMAL HIGH (ref 70–99)
Glucose-Capillary: 353 mg/dL — ABNORMAL HIGH (ref 70–99)
Glucose-Capillary: 368 mg/dL — ABNORMAL HIGH (ref 70–99)
Glucose-Capillary: 491 mg/dL — ABNORMAL HIGH (ref 70–99)
Glucose-Capillary: 586 mg/dL (ref 70–99)

## 2010-07-05 LAB — URINALYSIS, ROUTINE W REFLEX MICROSCOPIC
Bilirubin Urine: NEGATIVE
Hgb urine dipstick: NEGATIVE
Ketones, ur: NEGATIVE mg/dL
Nitrite: NEGATIVE
pH: 6 (ref 5.0–8.0)

## 2010-07-05 LAB — CBC
HCT: 30.8 % — ABNORMAL LOW (ref 39.0–52.0)
HCT: 30.8 % — ABNORMAL LOW (ref 39.0–52.0)
Hemoglobin: 10 g/dL — ABNORMAL LOW (ref 13.0–17.0)
Hemoglobin: 10.2 g/dL — ABNORMAL LOW (ref 13.0–17.0)
MCH: 30.3 pg (ref 26.0–34.0)
MCH: 31.2 pg (ref 26.0–34.0)
MCHC: 32.5 g/dL (ref 30.0–36.0)
MCHC: 33.1 g/dL (ref 30.0–36.0)
MCV: 93.3 fL (ref 78.0–100.0)
MCV: 94.2 fL (ref 78.0–100.0)
Platelets: 249 10*3/uL (ref 150–400)
Platelets: 268 10*3/uL (ref 150–400)
RBC: 3.27 MIL/uL — ABNORMAL LOW (ref 4.22–5.81)
RBC: 3.3 MIL/uL — ABNORMAL LOW (ref 4.22–5.81)
RDW: 17.5 % — ABNORMAL HIGH (ref 11.5–15.5)
RDW: 17.6 % — ABNORMAL HIGH (ref 11.5–15.5)
WBC: 10.6 10*3/uL — ABNORMAL HIGH (ref 4.0–10.5)
WBC: 8 10*3/uL (ref 4.0–10.5)

## 2010-07-05 LAB — BRAIN NATRIURETIC PEPTIDE: Pro B Natriuretic peptide (BNP): <30 pg/mL (ref 0.0–100.0)

## 2010-07-05 LAB — TSH: TSH: 1.373 u[IU]/mL (ref 0.350–4.500)

## 2010-07-06 LAB — GLUCOSE, CAPILLARY
Glucose-Capillary: 122 mg/dL — ABNORMAL HIGH (ref 70–99)
Glucose-Capillary: 130 mg/dL — ABNORMAL HIGH (ref 70–99)
Glucose-Capillary: 190 mg/dL — ABNORMAL HIGH (ref 70–99)
Glucose-Capillary: 229 mg/dL — ABNORMAL HIGH (ref 70–99)
Glucose-Capillary: 267 mg/dL — ABNORMAL HIGH (ref 70–99)
Glucose-Capillary: 324 mg/dL — ABNORMAL HIGH (ref 70–99)
Glucose-Capillary: 337 mg/dL — ABNORMAL HIGH (ref 70–99)
Glucose-Capillary: 394 mg/dL — ABNORMAL HIGH (ref 70–99)
Glucose-Capillary: 397 mg/dL — ABNORMAL HIGH (ref 70–99)
Glucose-Capillary: 407 mg/dL — ABNORMAL HIGH (ref 70–99)
Glucose-Capillary: 100 mg/dL — ABNORMAL HIGH (ref 70–99)
Glucose-Capillary: 120 mg/dL — ABNORMAL HIGH (ref 70–99)
Glucose-Capillary: 168 mg/dL — ABNORMAL HIGH (ref 70–99)
Glucose-Capillary: 174 mg/dL — ABNORMAL HIGH (ref 70–99)
Glucose-Capillary: 219 mg/dL — ABNORMAL HIGH (ref 70–99)
Glucose-Capillary: 272 mg/dL — ABNORMAL HIGH (ref 70–99)
Glucose-Capillary: 291 mg/dL — ABNORMAL HIGH (ref 70–99)
Glucose-Capillary: 318 mg/dL — ABNORMAL HIGH (ref 70–99)
Glucose-Capillary: 333 mg/dL — ABNORMAL HIGH (ref 70–99)
Glucose-Capillary: 335 mg/dL — ABNORMAL HIGH (ref 70–99)

## 2010-07-06 LAB — URINALYSIS, ROUTINE W REFLEX MICROSCOPIC
Bilirubin Urine: NEGATIVE
Glucose, UA: >1000 mg/dL — AB
Hgb urine dipstick: NEGATIVE
Ketones, ur: NEGATIVE mg/dL
Leukocytes, UA: NEGATIVE
Leukocytes, UA: NEGATIVE
Nitrite: NEGATIVE
Specific Gravity, Urine: 1.03 (ref 1.005–1.030)
pH: 5.5 (ref 5.0–8.0)
pH: 6 (ref 5.0–8.0)

## 2010-07-06 LAB — URINE MICROSCOPIC-ADD ON

## 2010-07-06 NOTE — Procedures (Signed)
Summary: [facility]   Imported By: Mirna Mires 06/29/2010 14:32:42  _____________________________________________________________________  External Attachment:    Type:   Image     Comment:   External Document
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
## 2010-07-06 NOTE — Letter (Signed)
Summary: [facility]: Treatment Summary Note   [facility]tr: Treatment Summary Note   Imported By: Earl Many 06/28/2010 10:10:27  _____________________________________________________________________  External Attachment:    Type:   Image     Comment:   External Document

## 2010-07-06 NOTE — Progress Notes (Signed)
Summary: rx sub request (pt was seen today)  Phone Note Call from Patient Call back at Home Phone [phone]   Caller: Spouse Mrs. Lwin Call For: Justin Ruiz Summary of Call: pt was seen today. spouse says that [facility] [place] was given rx's to fill, but caller wants to sub the cough syrup as it is too expensive and medicare won't pay for this. [phone] Initial call taken by: Tivis Ringer, CNA,  June 28, 2010 4:22 PM  Follow-up for Phone Call        called and spoke with Diane, Pharmacist at [facility].  Pt is stating the promethazine with codeine cough syrup is too expensive ($23.00)  Pt is requesting something cheaper.  Diane states Cheratussin AC would be cheaper for pt at $18.84.  Will forward message to Cy to address.  Aundra Millet Reynolds LPN  June 27, 100 5:38 PM  NKDA  per CY ok to give pt Chertussin AC 1 tsp every 6 hours as needed. Carron Curie CMA  June 28, 2010 5:46 PM   refill sent. pt aware.Carron Curie CMA  June 28, 2010 5:48 PM     New/Updated Medications: CHERATUSSIN AC 100-10 MG/5ML SYRP (GUAIFENESIN-CODEINE) 1 tsp every 6 hours as needed Prescriptions: CHERATUSSIN AC 100-10 MG/5ML SYRP (GUAIFENESIN-CODEINE) 1 tsp every 6 hours as needed  #272ml x 0   Entered by:   Carron Curie CMA   Authorized by:   Waymon Budge MD   Signed by:   Carron Curie CMA on 06/28/2010   Method used:   Telephoned to ...       [facility]  [name] (retail)       [address]       [place], [place]  [address]       Ph: [phone]       Fax: [phone]   RxID:   [id number]

## 2010-07-07 LAB — URINALYSIS, ROUTINE W REFLEX MICROSCOPIC
Bilirubin Urine: NEGATIVE
Glucose, UA: >1000 mg/dL — AB
Hgb urine dipstick: NEGATIVE
Ketones, ur: NEGATIVE mg/dL
Leukocytes, UA: NEGATIVE
Nitrite: NEGATIVE
Protein, ur: NEGATIVE mg/dL
Specific Gravity, Urine: 1.027 (ref 1.005–1.030)
Urobilinogen, UA: 0.2 mg/dL (ref 0.0–1.0)
pH: 6 (ref 5.0–8.0)

## 2010-07-07 LAB — GLUCOSE, CAPILLARY
Glucose-Capillary: 101 mg/dL — ABNORMAL HIGH (ref 70–99)
Glucose-Capillary: 124 mg/dL — ABNORMAL HIGH (ref 70–99)
Glucose-Capillary: 129 mg/dL — ABNORMAL HIGH (ref 70–99)
Glucose-Capillary: 169 mg/dL — ABNORMAL HIGH (ref 70–99)
Glucose-Capillary: 181 mg/dL — ABNORMAL HIGH (ref 70–99)
Glucose-Capillary: 190 mg/dL — ABNORMAL HIGH (ref 70–99)
Glucose-Capillary: 207 mg/dL — ABNORMAL HIGH (ref 70–99)
Glucose-Capillary: 219 mg/dL — ABNORMAL HIGH (ref 70–99)
Glucose-Capillary: 240 mg/dL — ABNORMAL HIGH (ref 70–99)
Glucose-Capillary: 246 mg/dL — ABNORMAL HIGH (ref 70–99)
Glucose-Capillary: 333 mg/dL — ABNORMAL HIGH (ref 70–99)
Glucose-Capillary: 380 mg/dL — ABNORMAL HIGH (ref 70–99)
Glucose-Capillary: 403 mg/dL — ABNORMAL HIGH (ref 70–99)

## 2010-07-07 LAB — COMPREHENSIVE METABOLIC PANEL
AST: 17 U/L (ref 0–37)
Albumin: 2.8 g/dL — ABNORMAL LOW (ref 3.5–5.2)
BUN: 22 mg/dL (ref 6–23)
Calcium: 8.7 mg/dL (ref 8.4–10.5)
Creatinine, Ser: 0.85 mg/dL (ref 0.4–1.5)
GFR calc Af Amer: >60 mL/min (ref >60)
Total Bilirubin: 0.7 mg/dL (ref 0.3–1.2)
Total Protein: 5.1 g/dL — ABNORMAL LOW (ref 6.0–8.3)

## 2010-07-07 LAB — CBC
MCH: 29.9 pg (ref 26.0–34.0)
MCV: 87.5 fL (ref 78.0–100.0)
Platelets: 248 10*3/uL (ref 150–400)
RBC: 3.71 MIL/uL — ABNORMAL LOW (ref 4.22–5.81)
RDW: 13.4 % (ref 11.5–15.5)

## 2010-07-07 LAB — URIC ACID: Uric Acid, Serum: 5.7 mg/dL (ref 4.0–7.8)

## 2010-07-07 LAB — PHOSPHORUS: Phosphorus: 3.9 mg/dL (ref 2.3–4.6)

## 2010-07-07 LAB — URINE MICROSCOPIC-ADD ON

## 2010-07-07 LAB — GLUCOSE, RANDOM: Glucose, Bld: 383 mg/dL — ABNORMAL HIGH (ref 70–99)

## 2010-07-08 LAB — CBC
HCT: 40 % (ref 39.0–52.0)
HCT: 39.9 % (ref 39.0–52.0)
HCT: 42.8 % (ref 39.0–52.0)
Hemoglobin: 13.8 g/dL (ref 13.0–17.0)
Hemoglobin: 13.9 g/dL (ref 13.0–17.0)
MCH: 30.1 pg (ref 26.0–34.0)
MCH: 30.6 pg (ref 26.0–34.0)
MCHC: 34.4 g/dL (ref 30.0–36.0)
MCHC: 34.6 g/dL (ref 30.0–36.0)
MCV: 86.8 fL (ref 78.0–100.0)
MCV: 88.4 fL (ref 78.0–100.0)
MCV: 88.7 fL (ref 78.0–100.0)
Platelets: 185 10*3/uL (ref 150–400)
Platelets: 214 10*3/uL (ref 150–400)
Platelets: 178 10*3/uL (ref 150–400)
RBC: 4.72 MIL/uL (ref 4.22–5.81)
RBC: 4.53 MIL/uL (ref 4.22–5.81)
RBC: 4.82 MIL/uL (ref 4.22–5.81)
RDW: 12.9 % (ref 11.5–15.5)
RDW: 13.1 % (ref 11.5–15.5)
RDW: 12.9 % (ref 11.5–15.5)
RDW: 13.4 % (ref 11.5–15.5)
WBC: 6.2 10*3/uL (ref 4.0–10.5)
WBC: 6.3 10*3/uL (ref 4.0–10.5)

## 2010-07-08 LAB — URINALYSIS, ROUTINE W REFLEX MICROSCOPIC
Bilirubin Urine: NEGATIVE
Glucose, UA: 100 mg/dL — AB
Glucose, UA: 500 mg/dL — AB
Ketones, ur: 15 mg/dL — AB
Ketones, ur: NEGATIVE mg/dL
Nitrite: NEGATIVE
Protein, ur: NEGATIVE mg/dL
Specific Gravity, Urine: 1.029 (ref 1.005–1.030)
pH: 5.5 (ref 5.0–8.0)

## 2010-07-08 LAB — DIFFERENTIAL
Basophils Absolute: 0 10*3/uL (ref 0.0–0.1)
Basophils Relative: 1 % (ref 0–1)
Eosinophils Absolute: 0.1 10*3/uL (ref 0.0–0.7)
Eosinophils Relative: 9 % — ABNORMAL HIGH (ref 0–5)
Eosinophils Relative: 2 % (ref 0–5)
Lymphocytes Relative: 13 % (ref 12–46)
Lymphocytes Relative: 49 % — ABNORMAL HIGH (ref 12–46)
Lymphs Abs: 0.8 10*3/uL (ref 0.7–4.0)
Lymphs Abs: 2.1 10*3/uL (ref 0.7–4.0)
Monocytes Absolute: 0.1 10*3/uL (ref 0.1–1.0)
Monocytes Absolute: 0.6 10*3/uL (ref 0.1–1.0)
Monocytes Relative: 16 % — ABNORMAL HIGH (ref 3–12)
Monocytes Relative: 2 % — ABNORMAL LOW (ref 3–12)
Monocytes Relative: 6 % (ref 3–12)
Monocytes Relative: 8 % (ref 3–12)
Neutro Abs: 5.1 10*3/uL (ref 1.7–7.7)
Neutro Abs: 4.8 10*3/uL (ref 1.7–7.7)
Neutrophils Relative %: 26 % — ABNORMAL LOW (ref 43–77)
Neutrophils Relative %: 62 % (ref 43–77)
Neutrophils Relative %: 66 % (ref 43–77)

## 2010-07-08 LAB — GLUCOSE, CAPILLARY
Glucose-Capillary: 107 mg/dL — ABNORMAL HIGH (ref 70–99)
Glucose-Capillary: 115 mg/dL — ABNORMAL HIGH (ref 70–99)
Glucose-Capillary: 165 mg/dL — ABNORMAL HIGH (ref 70–99)
Glucose-Capillary: 168 mg/dL — ABNORMAL HIGH (ref 70–99)
Glucose-Capillary: 287 mg/dL — ABNORMAL HIGH (ref 70–99)
Glucose-Capillary: 324 mg/dL — ABNORMAL HIGH (ref 70–99)
Glucose-Capillary: 357 mg/dL — ABNORMAL HIGH (ref 70–99)
Glucose-Capillary: 384 mg/dL — ABNORMAL HIGH (ref 70–99)
Glucose-Capillary: 133 mg/dL — ABNORMAL HIGH (ref 70–99)
Glucose-Capillary: 147 mg/dL — ABNORMAL HIGH (ref 70–99)
Glucose-Capillary: 150 mg/dL — ABNORMAL HIGH (ref 70–99)
Glucose-Capillary: 332 mg/dL — ABNORMAL HIGH (ref 70–99)
Glucose-Capillary: 115 mg/dL — ABNORMAL HIGH (ref 70–99)

## 2010-07-08 LAB — COMPREHENSIVE METABOLIC PANEL
ALT: 20 U/L (ref 0–53)
Albumin: 3.2 g/dL — ABNORMAL LOW (ref 3.5–5.2)
Alkaline Phosphatase: 49 U/L (ref 39–117)
BUN: 19 mg/dL (ref 6–23)
BUN: 20 mg/dL (ref 6–23)
BUN: 19 mg/dL (ref 6–23)
CO2: 27 mEq/L (ref 19–32)
Calcium: 8.9 mg/dL (ref 8.4–10.5)
Calcium: 9.3 mg/dL (ref 8.4–10.5)
Chloride: 101 mEq/L (ref 96–112)
Creatinine, Ser: 0.82 mg/dL (ref 0.4–1.5)
Creatinine, Ser: 0.86 mg/dL (ref 0.4–1.5)
GFR calc non Af Amer: >60 mL/min (ref >60)
Glucose, Bld: 202 mg/dL — ABNORMAL HIGH (ref 70–99)
Glucose, Bld: 120 mg/dL — ABNORMAL HIGH (ref 70–99)
Potassium: 4 mEq/L (ref 3.5–5.1)
Sodium: 129 mEq/L — ABNORMAL LOW (ref 135–145)
Sodium: 135 mEq/L (ref 135–145)
Total Bilirubin: 0.9 mg/dL (ref 0.3–1.2)
Total Protein: 5.9 g/dL — ABNORMAL LOW (ref 6.0–8.3)
Total Protein: 5.9 g/dL — ABNORMAL LOW (ref 6.0–8.3)
Total Protein: 6.9 g/dL (ref 6.0–8.3)

## 2010-07-08 LAB — LACTATE DEHYDROGENASE: LDH: 137 U/L (ref 94–250)

## 2010-07-08 LAB — PROTIME-INR: Prothrombin Time: 13.1 seconds (ref 11.6–15.2)

## 2010-07-08 LAB — CHROMOSOME ANALYSIS, BONE MARROW

## 2010-07-08 LAB — LIPASE, BLOOD: Lipase: 22 U/L (ref 11–59)

## 2010-07-08 LAB — TISSUE HYBRIDIZATION (BONE MARROW)-NCBH

## 2010-07-08 LAB — APTT: aPTT: 29 seconds (ref 24–37)

## 2010-07-13 NOTE — Procedures (Signed)
Summary: Summary Report  Summary Report   Imported By: Erle Crocker 07/09/2010 13:27:17  _____________________________________________________________________  External Attachment:    Type:   Image     Comment:   External Document

## 2010-07-16 ENCOUNTER — Other Ambulatory Visit: Payer: Self-pay | Admitting: Oncology

## 2010-07-16 ENCOUNTER — Encounter (HOSPITAL_BASED_OUTPATIENT_CLINIC_OR_DEPARTMENT_OTHER): Payer: Medicare Other | Admitting: Oncology

## 2010-07-16 DIAGNOSIS — Z5112 Encounter for antineoplastic immunotherapy: Secondary | ICD-10-CM

## 2010-07-16 DIAGNOSIS — C8583 Other specified types of non-Hodgkin lymphoma, intra-abdominal lymph nodes: Secondary | ICD-10-CM

## 2010-07-16 DIAGNOSIS — I4891 Unspecified atrial fibrillation: Secondary | ICD-10-CM

## 2010-07-16 DIAGNOSIS — I10 Essential (primary) hypertension: Secondary | ICD-10-CM

## 2010-07-16 LAB — COMPREHENSIVE METABOLIC PANEL
Albumin: 4.2 g/dL (ref 3.5–5.2)
BUN: 18 mg/dL (ref 6–23)
Calcium: 10.1 mg/dL (ref 8.4–10.5)
Chloride: 100 mEq/L (ref 96–112)
Creatinine, Ser: 0.78 mg/dL (ref 0.40–1.50)
Glucose, Bld: 165 mg/dL — ABNORMAL HIGH (ref 70–99)
Potassium: 4.1 mEq/L (ref 3.5–5.3)

## 2010-07-16 LAB — CBC WITH DIFFERENTIAL/PLATELET
BASO%: 0.7 % (ref 0.0–2.0)
LYMPH%: 35.1 % (ref 14.0–49.0)
MCHC: 34.2 g/dL (ref 32.0–36.0)
MONO#: 0.5 10*3/uL (ref 0.1–0.9)
MONO%: 9.2 % (ref 0.0–14.0)
NEUT#: 2.6 10*3/uL (ref 1.5–6.5)
Platelets: 199 10*3/uL (ref 140–400)
RBC: 4.52 10*6/uL (ref 4.20–5.82)
RDW: 14.5 % (ref 11.0–14.6)
WBC: 5 10*3/uL (ref 4.0–10.3)

## 2010-07-16 LAB — LACTATE DEHYDROGENASE: LDH: 168 U/L (ref 94–250)

## 2010-07-16 LAB — MORPHOLOGY: RBC Comments: NORMAL

## 2010-07-16 LAB — SEDIMENTATION RATE: Sed Rate: 7 mm/hr (ref 0–16)

## 2010-07-20 ENCOUNTER — Encounter: Payer: Self-pay | Admitting: Internal Medicine

## 2010-07-23 ENCOUNTER — Encounter (HOSPITAL_BASED_OUTPATIENT_CLINIC_OR_DEPARTMENT_OTHER): Payer: Medicare Other | Admitting: Oncology

## 2010-07-23 DIAGNOSIS — Z452 Encounter for adjustment and management of vascular access device: Secondary | ICD-10-CM

## 2010-07-23 DIAGNOSIS — C8583 Other specified types of non-Hodgkin lymphoma, intra-abdominal lymph nodes: Secondary | ICD-10-CM

## 2010-07-28 LAB — GLUCOSE, CAPILLARY: Glucose-Capillary: 275 mg/dL — ABNORMAL HIGH (ref 70–99)

## 2010-08-06 ENCOUNTER — Encounter: Payer: Self-pay | Admitting: Family Medicine

## 2010-08-11 ENCOUNTER — Ambulatory Visit: Payer: Medicare Other | Admitting: Internal Medicine

## 2010-09-07 NOTE — Assessment & Plan Note (Signed)
[facility]                             PULMONARY OFFICE NOTE   Justin Ruiz, Justin Ruiz                         MRN:          [id number]  DATE:11/03/2006                            DOB:          1951/02/16    NEW PULMONARY PATIENT.   PROBLEM:  A 60 year old man who had been followed by me at the old  office years ago and hospitalized by me in December of 2006, wants  pulmonary evaluation.  He was being followed more recently by Dr.  Lowanda Foster at [facility].  He is a former smoker.  He thinks he can walk a mile or two, but he avoids heat and humid  weather.  Little cough, no chest pain, no acute event or recent problem.  He brings a chest x-ray from [facility] dated  January 16, 2006, showing prominent bronchovascular markings, some  osteophytes in the spine, no acute process.  Overall picture consistent  with bronchitis.   MEDICATION:  1. Glipizide 10 mg.  2. Metformin b.i.d.  3. Enalapril 20 mg.  4. Simvastatin 40 mg.  5. Aspirin 81 mg.  6. Albuterol rescue inhaler.  7. He has a nebulizer with Xopenex 1.25 mg but thinks it is expensive      for what good it does him.   REVIEW OF SYSTEMS:  Exertional dyspnea, productive and nonproductive  cough, weight gain, nasal congestion, itching, joint stiffness.   PAST HISTORY:  1. Pneumonia at least twice.  2. Pneumococcal vaccine at least once in 2006.  3. Hypertension.  4. Asthma.  5. Diabetes.  6. Elevated cholesterol.  7. In the last year or two he has had increasing sneezing and itching      of eyes and wonders if he is developing allergies to environmental      triggers.  8. Surgery for appendectomy.  9. No history of tuberculosis exposure.  10.Hyperlipidemia.  11.Benign prostatic hypertrophy.  12.Colon polyps.  13.Esophageal reflux.   SOCIAL HISTORY:  1. Married.  2. 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  3. He  had smoked up to 2 packs per day until he quit about a year ago.   FAMILY HISTORY:  1. Only child died of sudden infant death syndrome.  2. Family history of:      a.     Emphysema.      b.     Asthma.      c.     Heart disease.      d.     Cancer.   OBJECTIVE:  Weight 309 pounds, BP 104/68, pulse 90, room air saturation  96%.  He is obese.  HEENT:  Dentures, no stridor, no neck vein distention, no adenopathy.  CHEST:  Breath sounds are clear but diminished and slow.  HEART SOUNDS:  Regular without murmur or gallop.  There is trace edema at the right ankle, none at the left, no cyanosis  or clubbing.   IMPRESSION:  1. Chronic obstructive pulmonary disease, probably severe.  2. Exogenous obesity.  3.  He looks likely to have sleep apnea but completely denies      associated symptoms.  I have asked him to ask his wife about this.   PLAN:  1. Chest x-ray.  2. Pulmonary function test.  3. We are going to replace his nebulizer Xopenex solution with      Albuterol 0.083%, which will at least be cheaper.  We discussed      appropriate use and we will want to assess for maintenance anti-      inflammatory therapies on return.  He is encouraged to walk for      endurance and weight loss.  Schedule return 1-2 months, earlier      p.r.n.     Clinton D. Maple Hudson, MD, Tonny Bollman, FACP  Electronically Signed    CDY/MedQ  DD: 11/03/2006  DT: 11/05/2006  Job #: [id number]   cc:   [facility]

## 2010-09-07 NOTE — Letter (Signed)
January 06, 2010   [name], MD  [address] 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  I have informed him of the diagnosis and will send him to Dr.  Pierce Crane for evaluation and treatment.  I appreciate the opportunity  of seeing the patient.  His blood pressure was 137/82, pulse 93,  respirations 18, sats were 92%.   [name], M.D.  Electronically Signed   DPB/MEDQ  D:  01/06/2010  T:  01/07/2010  Job:  [id number]   cc:   Pierce Crane, MD

## 2010-09-07 NOTE — Letter (Signed)
December 17, 2009   [name], MD  [address] [place], [place] [address]   Re:  Justin Ruiz, AIKEN                  DOB:  Oct 30, 1950   Dear [name]:   I appreciate the opportunity of seeing the patient.  This 60 year old  obese male quit smoking 4 years ago.  He was in the hospital in 2010  with severe COPD.  He had a pulmonary function test which showed an  FEV1/FVC ratio 0.57, which gone along with severe obstructive disease.  He had also a history of cardiac disease.  He has diabetes mellitus and  has hypertension.  The patient is disabled secondary to his chronic  obstructive pulmonary disease.  He also has a normal stress  electrocardiography and has worked up by Dr. Antoine Poche in January of this  year.  He has had no hemoptysis, fever, chills, or excessive sputum.  CT  scan of the chest showed multiple small nodules that were both the right  side and the left side, primarily on the right side.  He also had  multiple gallstones and mediastinal adenopathy.  However, it did lead to  an abdominal CT showed that he had an upper abdominal adenopathy with a  5.1 x 3.0 retrocaval node and a 2.9 x 2.5 periaortic node.  He is  referred here for evaluation.   MEDICATIONS:  Humalog 70/25, metformin, enalapril, simvastatin, ProAir,  and nebulizers.   ALLERGIES:  He has no allergies.   FAMILY HISTORY:  Noncontributory.   SOCIAL HISTORY:  He is married and disabled.  Quit smoking 4 years ago.  Does not drink alcohol.   REVIEW OF SYSTEMS:  GENERAL:  He is 305 pounds.  He is 5 feet 11 inches.  CARDIAC:  See history of present illness.  PULMONARY:  See history of present illness.  He has been treated for  bronchitis and asthma.  GI:  No nausea, vomiting, constipation, or diarrhea.  GU:  No kidney disease, dysuria, or frequent urination.  VASCULAR:  No claudication, DVT, or TIAs.  NEUROLOGICAL:  No dizziness, headaches, blackouts, or seizures.  MUSCULOSKELETAL:  Arthritis.  PSYCHIATRIC:   Nervousness.  EYE/ENT:  No changes in eyesight or hearing.  HEMATOLOGICAL:  No problems with bleeding, clotting disorders, or  anemia.   PHYSICAL EXAMINATION:  General:  He is an obese Caucasian male, in no  acute distress.  He is accompanied by his wife.  Vital Signs:  His blood  pressure was 133/86, pulse 82, respirations 18, and sats were 94%.  Head, Eyes, Ears, Nose, and Throat:  Unremarkable.  Neck:  Supple  without thyromegaly.  There is no supraclavicular or axillary  adenopathy.  Chest:  Clear to auscultation and percussion.  I did not  hear any wheezes.  Heart:  Regular, sinus rhythm.  No murmurs.  Abdomen:  Obese.  Bowel sounds are normal.  Extremities:  Pulses are 2+.  1+  edema.  No clubbing.  Neurological:  He is oriented x3.  Sensory and  motor intact.   I think, his multiple pulmonary nodules are really too small to biopsy  other than an open biopsy and are hopefully inflammatory nodules or  possibly granulomas.  However, I am concerned about the upper abdominal  adenopathy, which was a good chance this could be lymphoma.  We  discussed it today in cancer conference before he came to our  [facility] and  the consensus was to get  a PET scan.  If the PET scan is positive, then we will proceed with a  needle biopsy of these lymph nodes.  I appreciate the opportunity of  seeing the patient.   Sincerely,   [name], M.D.  Electronically Signed   DPB/MEDQ  D:  12/17/2009  T:  12/18/2009  Job:  [id number]

## 2010-09-07 NOTE — Letter (Signed)
December 29, 2009   [name], M.D.  [address] [place], [place] [address]   Re:  Justin Ruiz, GOMES                  DOB:  [birth year]   Dear [name],   I saw the patient back after his PET scan.  The large retroperitoneal  node or mass was positive on his PET scan with a standard uptake value  of 15.5.  I have recommended that he get a needle biopsy and depending  on what it shows we will refer him probably back to Dr. Donnie Coffin for  consideration of treatment.  There is a possibility he may need to see a  general surgeon if this is a retroperitoneal sarcoma.  The pulmonary  nodules showed no uptake as I suspected, but that is not a 100% since  they are so small in nature that they may not have appropriate uptake.   I appreciate the opportunity of seeing the patient.   [name], M.D.  Electronically Signed   DPB/MEDQ  D:  12/29/2009  T:  12/30/2009  Job:  [id number]

## 2010-09-10 NOTE — H&P (Signed)
NAME:  Justin Ruiz, Justin Ruiz                ACCOUNT NO.:  [id number]   MEDICAL RECORD NO.:  [id number]          PATIENT TYPE:  INP   LOCATION:  5731                         FACILITY:  [facility]   PHYSICIAN:  Clinton D. Maple Hudson, M.D. DATE OF BIRTH:  [birth year]   DATE OF ADMISSION:  02/22/2005  DATE OF DISCHARGE:                                HISTORY & PHYSICAL   CHIEF COMPLAINT:  Shortness of breath, cough.   HISTORY OF PRESENT ILLNESS:  As patient recalls, symptoms first began  approximately two days ago with sneeze and cough.  Reports cough initially  as nonproductive.  One day ago reports increase in postnasal drip which was  initially noted to be clear discharge.  He recalls his shortness of breath  began to increase later last night primarily associated with cough.  Throughout the day yesterday he had been taking albuterol hand-held  nebulizers p.r.n. up to two to three times with moderate relief.  When he  tried to take his albuterol nebulizer this morning he got little to no  relief.  Today he reports his cough to be coarse, continuous, but still  nonproductive.  He also complains of chest congestion, postnasal drip, sore  throat, chest tightness, and difficulty taking deep breaths.  He tells me at  baseline he is able to carry out normal activities of daily living without  interruption by shortness of breath.  When he arrived to his primary care  Terrion Gencarelli this morning he reported having to stop several times to rest just  walking from the car to the office and had significant difficulty  recovering.  When evaluated in his primary care Keldan Eplin's office the  patient's oxygen saturations were found to be in the low 80s on room air.  Diagnostic evaluation per his primary care Erica Richwine, Dr. Christell Constant demonstrated  a left lower lobe pneumonia.  He was directly admitted to [facility] to the medical/surgical ward under the care of Dr. Maple Hudson.  Upon  primary care arrival the patient is  currently on approximately 60%  nonrebreather mask and reports that his work of breathing has significantly  decreased with this high-flow oxygen device.  He does recall recent sick  exposure prior to onset of illness.  His initial ABG on 100% was 7.39, pCO2  of 46, pO2 of 102, bicarbonate of 27.   PAST MEDICAL HISTORY:  1.  Morbid obesity.  2.  Probable chronic obstructive pulmonary disease with significant history      of smoking and one to two asthmatic bronchitis flares a year requiring      empiric steroids and prednisone pulse/taper.  3.  Hyperlipidemia.  4.  Benign prostatic hypertrophy.  5.  Colon polyps.  6.  Hypertension.  7.  Gastroesophageal reflux disease.  8.  Diabetes type 2.  9.  Appendectomy.   FAMILY HISTORY:  Mother died of complications of diabetes.  Father died of  complications of prostate and colon cancer.   SOCIAL HISTORY:  He is married.  Fully functional, but currently laid off.  He is a Curator by trade.  He stopped smoking  about 15 years ago, but was a  1.5-2 pack per day smoker for over 30 years.   MEDICATIONS:  1.  Aspirin 81 mg one daily p.r.n.  2.  Lipitor 40 mg one q.h.s.  3.  Benicar 20 mg one daily.  4.  Actos plus Met 15/500 one b.i.d.  5.  Ventolin MDI p.r.n.   ALLERGIES:  No known drug allergies.   REVIEW OF SYSTEMS:  Denies nausea, vomiting, abdominal tenderness or pain,  leg swelling, or any other pertinent positives.   PHYSICAL EXAMINATION:  VITAL SIGNS:  Tmax 102.7, heart rate 100, blood  pressure 132/68, respirations 22, saturations 98% on approximately 60% high-  flow oxygen mask.  HEENT:  No JVD or adenopathy.  PULMONARY:  Diffuse rhonchi with expiratory wheezes and decreased breath  sounds in the left lower lobe.  CARDIAC:  Tachy, regular rate and rhythm.  ABDOMEN:  Soft with positive bowel sounds, nontender.  GENITOURINARY:  Clear and concentrated urine.  NEUROLOGIC:  Grossly intact.   LABORATORY DATA:  UA initially  negative.  Blood culture and sputum pending.  Hemoglobin 13.8, hematocrit 38.9, white blood cell 11.3 with neutrophil  count 88%, platelets currently at 157.  ABG as noted earlier:  pH 7.39, pCO2  of 46, pO2 102, bicarbonate 27 on 100% nonrebreather.  Sodium 135, potassium  4.3, chloride 99, CO2 27, BUN 12, creatinine 1, glucose 185.   IMPRESSION AND PLAN:  1.  Hypoxic respiratory failure secondary to probable community-acquired      pneumonia superimposed on underlying chronic obstructive pulmonary      disease.  The plan for this is to administer oxygen to keep saturations      greater than 92%, escalate care to place patient on stepdown unit with      plan to monitor continuous pulse oximetry.  Follow up current cultures.      Provide intravenous antibiotics.  Provide aggressive nebulizers and      systemic corticosteroids.  2.  Systemic inflammatory response syndrome with probable sepsis secondary      to problem #1.  Plan for this is to escalate patient's care to stepdown      unit, provide antibiotics as previously outlined, watch closely for      signs of clinical deterioration, gentle intravenous hydration, and check      a venous lactate.  3.  Diabetes type 2 with current hyperglycemia probably exacerbated by      intravenous steroids.  Plan for this will be to place the patient on a      sliding scale insulin and add basal insulin if needed.      Anders Simmonds, N.P. [facility]      Clinton D. Maple Hudson, M.D.  Electronically Signed   PB/MEDQ  D:  02/22/2005  T:  02/22/2005  Job:  [id number]   cc:   Christell Constant, M.D.  [place], [place]

## 2010-09-10 NOTE — Letter (Signed)
April 28, 2006    To Whom It May Concern:   RE:  [name], [name]  MRN:  [id number]  /  DOB:  [birth year]   This is a letter confirming my opinion that 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   He has been followed by me intermittently over a number of years and  seen most recently for full office visit on November 03, 2006 for history of  severe chronic obstructive pulmonary disease with asthma, history of  pneumonia and complicating medical problems including hypertension,  diabetes, hyperlipidemia and elevated cholesterol, significant obesity  and colon polyps.  He is also followed for primary medical care by Dr.  Rudi Heap at [facility] in [place], [place]  [place].  Pulmonary function testing at this office on November 23, 2006  yielded an FVC of 3.09 (63% predicted), FEV-1 of 1.46 (41% predicted),  FEV-1/FVC ratio of 47% and flow volume loop contour consistent with a  significant emphysema component.  Measured lung volumes demonstrated  air-trapping with residual volume of 160% of predicted.  Diffusion  capacity was 22.8 or 69% of predicted.  On a 6-MET walk test on that  date, he went 441 meters.  Heart rate rose from 90 to 143 and 2 minutes  later was 109, blood pressure rose to 126/84 to 146/92 and 2 minutes  later was 140/90, oxygen saturation dropped from 95% on room air to 91%  by the end of the walk and 2 minutes later had recovered to 96% on room  air with the test being significant for cardiac acceleration and slight  oxygen desaturation.  Chest x-ray of November 03, 2006 showed hyperaeration  consistent with COPD.  Heart size was normal.  There were degenerative  changes in the lower thoracic spine.  My overall impression is of severe  chronic obstructive pulmonary disease complicated by exogenous obesity  and deconditioning, likely to significantly  and chronically reduce his  ability to maintain gainful employment.  Note that he had smoked 2 packs  per day until quitting about a year ago.  I cannot exclude the  possibility that weight loss and vocational rehabilitation will provide  some benefit.  Note, I find additional record from a cardiology  evaluation by Dr. Antoine Poche on March 10, 2004 of an abnormal exercise  treadmill test.  The cardiologist's interpretation was that it was  likely he had nonobstructive coronary disease.  I do not have a formal  report of that study.    Sincerely,      Clinton D. Maple Hudson, MD, Tonny Bollman, FACP  Electronically Signed    CDY/MedQ  DD: 04/29/2007  DT: 04/29/2007  Job #: [id number]   CC:    [name], M.D.

## 2010-09-10 NOTE — Discharge Summary (Signed)
NAME:  Justin Ruiz, Justin Ruiz                ACCOUNT NO.:  [id number]   MEDICAL RECORD NO.:  [id number]          PATIENT TYPE:  INP   LOCATION:  5040                         FACILITY:  [facility]   PHYSICIAN:  Clinton D. Maple Hudson, M.D. DATE OF BIRTH:  [birth year]   DATE OF ADMISSION:  02/22/2005  DATE OF DISCHARGE:  02/24/2005                                 DISCHARGE SUMMARY   DISCHARGE DIAGNOSES:  1.  Acute respiratory failure.  2.  Pneumonia, organism not otherwise specified.  3.  Acute exacerbation of chronic obstructive pulmonary disease.  4.  Morbid obesity.  5.  Hyperlipidemia.  6.  Benign prostatic hypertrophy without obstruction.  7.  History of colon polyps.  8.  Essential hypertension.  9.  Esophageal reflux.  10. Diabetes mellitus type 2, non-insulin-dependent, adult onset.  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   BRIEF HISTORY:  This is a morbidly obese male who presented with rapidly  progressive respiratory illness and dyspnea with cough. Onset was described  as postnasal drip with sitting to cough. He was using albuterol by metered  inhaler and nebulizer without relief. He described his baseline activity as  daily affairs without shortness of breath but on presentation to his primary  office, he had to stop several times just trying to walk into the office  with slow recovery. He was found to have saturation in the low 80% range on  room air. Initial chest x-ray demonstrated left lower lobe pneumonia. He was  directly admitted on pulmonary management with his problems with dyspnea.   On arrival, a 60% non-rebreather mask, dyspnea was decreased and blood gas  showed a pH of 7.39, pCO2 46, pO2 102, bicarbonate 0.7.   PAST HISTORY:  1.  Morbid obesity.  2.  Probable chronic obstructive disease with significant smoking.  3.  Episodic asthmatic bronchitis flares prior steroids.  4.  Hyperlipidemia.  5.  Benign prostatic hypertrophy.  6.  Colon polyps.  7.  Hypotension.  8.  Gastroesophageal reflux  disease.  9.  Diabetes type 2.   Examination on admission to the hospital revealed temperature 102.7 with  heart rate 100, BP 132/68, tachypnea. There was no neck vein distension.  There were diffuse rhonchi with expiratory wheezes. Diminished breath sounds  particularly on the left lower lobe. Heart sounds were regular without  murmur.   HOSPITAL COURSE:  He was begun on antibiotic coverage with possibility of an  early sepsis syndrome using Rocephin and Zithromax, intravenous Solu-Medrol.  Lovenox was dosed for DVT prophylaxis and Protonix for GI bleed prophylaxis.  Sliding scale insulin coverage was provided in the moderate sensitive range.  Pneumococcal vaccine was given. He showed steady improvement. Oxygen was  titrated and his temperature came down. There was question of superficial  phlebitis left calf because of tenderness and leg Dopplers were obtained. At  discharge, saturation was 94% on room air at rest. Leg vein Dopplers had  been negative. EKG was noted to question inferior ischemia as discussed with  the patient who has substantial risk factors but denied symptoms or a prior  history. Lung fields were clear with no  cough or wheeze and he was  considered very substantially improved appropriate for outpatient follow-up.   LABORATORY:  Dopplers of leg veins had shown no deep vein thrombosis,  superficial thrombophlebitis or Baker's cyst bilaterally. Initial arterial  blood gas on 100% oxygen by mask reported pH 7.38, pCO2 46, pO2 102,  bicarbonate 27. Initial white blood count 11,300 with hemoglobin of 13.8 and  left shift. Normal platelet count. Initial coagulation profile normal. D-  dimer less than 0.22. Initial chemistries normal with exception of a  presenting glucose of 185 and total bilirubin of 2.0. Lactic acid was normal  at 1.5. Urinalysis was unremarkable except for glucose of 100 and ketones  15. Blood cultures were negative at five days. Sputum culture grew  normal  flora. Urinary streptococcal antigen was negative.   DISCHARGE PLAN:  He returns to primary follow-up at [facility] in 1-2 weeks. Diet and activity as tolerated with  instructions to use a fat-modified diabetic diet as instructed by Dr. Virginia Rochester.  Aspirin 81 milligrams, Lipitor 20 milligrams, Actoplus Met 15/500 1 b.i.d.,  Benicar 20 milligrams, Flovent 44 2 puffs b.i.d., albuterol inhaler 2 puffs  q.i.d. p.r.n., Zithromax 250 milligrams daily for five more days, prednisone  10 milligrams tabs to take three daily x2 days,  two daily x2 days, one  daily x2 days then stop. Weight loss and elevation of legs to prevent stasis  were instructed. Note that he did receive pneumococcal vaccine this  admission.      Clinton D. Maple Hudson, M.D.  Electronically Signed     CDY/MEDQ  D:  05/14/2005  T:  05/16/2005  Job:  [id number]   cc:   [name], M.D.  Fax: [phone]

## 2010-09-10 NOTE — Discharge Summary (Signed)
NAME:  Justin Ruiz, Justin Ruiz NO.:  [id number]   MEDICAL RECORD NO.:  [id number]          PATIENT TYPE:  INP   LOCATION:  3017                         FACILITY:  [facility]   PHYSICIAN:  Clinton D. Maple Hudson, MD, FCCP, FACPDATE OF BIRTH:  04/01/1951   DATE OF ADMISSION:  10/16/2007  DATE OF DISCHARGE:  10/18/2007                               DISCHARGE SUMMARY   DISCHARGE DIAGNOSES:  1. Chronic obstructive pulmonary disease with acute exacerbation.  2. Abnormal chest x-ray, lingular infiltrate.  3. Diabetes mellitus type 2, non-insulin-dependent, adult-onset.  4. History of tobacco use.  5. Morbid obesity.  6. Hypertension, not otherwise specified.  7. Hypercholesterolemia.   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  One day prior to admission, rapid  progression of sore throat, dry cough, dyspnea, and hypoxemia without  myalgias.  He presented to the emergency room because of dyspnea.   PHYSICAL EXAMINATION:  VITAL SIGNS:  BP 150/90, pulse 110, and  temperature 98.5.  LUNGS:  Diminished breath sounds with rhonchi on the right without  cyanosis, clubbing or edema and he was admitted for acute exacerbation  of COPD consistent with a viral syndrome.   Initial white blood count 12,300.  Chest x-ray showed a questionable  rounded density of the lingula.   HOSPITAL COURSE:  He was treated initially with Rocephin and Zithromax  after an emergency room bolus, steroids were held because of diabetes.  Blood sugars managed by sliding scale.  He improved steadily and was  discharged home, condition improved, for office followup with impression  that this is a viral triggered acute exacerbation of COPD.   LABORATORY:  WBC 12,300, hemoglobin 15.2, platelet count 190,000, and  85% neutrophils initially.  Admission glucose 178, BUN 16, and  creatinine 0.88.  Total bilirubin 1.8, otherwise liver enzymes normal.  Hemoglobin A1c  was 7.8 on October 16, 2007.  Cardiac enzymes without  evidence of acute myocardial injury.  TSH 0.369.  One of 2 blood  cultures grew a Corynebacterium diphtheroid, felt likely to be a  contaminant.   DISCHARGE PLAN:  He was discharged to office followup to see Dr. Maple Hudson  for Pulmonary followup in 2 weeks and to see Dr. Rudi Heap for  primary care followup as scheduled.  Diabetic diet.  Insulin as per Dr.  Christell Constant.  Initially Humalog 75/25 taking 42 units a.m. and 36 units p.m.,  simvastatin 40 mg, aspirin 81 mg, ProAir rescue inhaler 2 puffs q.i.d.  p.r.n., enalapril 20 mg, nebulized Xopenex 1.25 mg q.i.d. p.r.n.,  Janumet to continue his home dose, Zithromax 250 mg daily x5 days, and  Flovent 44 two puffs b.i.d. with mouth care.      Clinton D. Maple Hudson, MD, Tonny Bollman, FACP  Electronically Signed     CDY/MEDQ  D:  11/02/2007  T:  11/03/2007  Job:  [id number]   cc:   [name], M.D.

## 2010-09-14 ENCOUNTER — Other Ambulatory Visit: Payer: Self-pay | Admitting: Oncology

## 2010-09-14 ENCOUNTER — Encounter: Payer: Medicare Other | Admitting: Oncology

## 2010-09-14 ENCOUNTER — Ambulatory Visit (HOSPITAL_COMMUNITY)
Admission: RE | Admit: 2010-09-14 | Discharge: 2010-09-14 | Disposition: A | Discharge status: Home / Self Care | Payer: Medicare Other | Source: Ambulatory Visit | Attending: Oncology | Admitting: Oncology

## 2010-09-14 ENCOUNTER — Encounter (HOSPITAL_COMMUNITY): Payer: Self-pay

## 2010-09-14 DIAGNOSIS — Q619 Cystic kidney disease, unspecified: Secondary | ICD-10-CM | POA: Insufficient documentation

## 2010-09-14 DIAGNOSIS — K8020 Calculus of gallbladder without cholecystitis without obstruction: Secondary | ICD-10-CM | POA: Insufficient documentation

## 2010-09-14 DIAGNOSIS — M47814 Spondylosis without myelopathy or radiculopathy, thoracic region: Secondary | ICD-10-CM | POA: Insufficient documentation

## 2010-09-14 DIAGNOSIS — Z9089 Acquired absence of other organs: Secondary | ICD-10-CM | POA: Insufficient documentation

## 2010-09-14 DIAGNOSIS — C8590 Non-Hodgkin lymphoma, unspecified, unspecified site: Secondary | ICD-10-CM

## 2010-09-14 DIAGNOSIS — C8589 Other specified types of non-Hodgkin lymphoma, extranodal and solid organ sites: Secondary | ICD-10-CM | POA: Insufficient documentation

## 2010-09-14 DIAGNOSIS — K5730 Diverticulosis of large intestine without perforation or abscess without bleeding: Secondary | ICD-10-CM | POA: Insufficient documentation

## 2010-09-14 DIAGNOSIS — J984 Other disorders of lung: Secondary | ICD-10-CM | POA: Insufficient documentation

## 2010-09-14 DIAGNOSIS — M47817 Spondylosis without myelopathy or radiculopathy, lumbosacral region: Secondary | ICD-10-CM | POA: Insufficient documentation

## 2010-09-14 DIAGNOSIS — I708 Atherosclerosis of other arteries: Secondary | ICD-10-CM | POA: Insufficient documentation

## 2010-09-14 HISTORY — DX: Non-Hodgkin lymphoma, unspecified, unspecified site: C85.90

## 2010-09-14 LAB — CBC WITH DIFFERENTIAL/PLATELET
Basophils Absolute: 0 10*3/uL (ref 0.0–0.1)
Eosinophils Absolute: 0.1 10*3/uL (ref 0.0–0.5)
HCT: 42.5 % (ref 38.4–49.9)
HGB: 14.6 g/dL (ref 13.0–17.1)
LYMPH%: 33.4 % (ref 14.0–49.0)
MONO#: 0.5 10*3/uL (ref 0.1–0.9)
NEUT#: 2 10*3/uL (ref 1.5–6.5)
NEUT%: 50.4 % (ref 39.0–75.0)
Platelets: 186 10*3/uL (ref 140–400)
RBC: 4.9 10*6/uL (ref 4.20–5.82)
WBC: 3.9 10*3/uL — ABNORMAL LOW (ref 4.0–10.3)

## 2010-09-14 LAB — CMP (CANCER CENTER ONLY)
Albumin: 3.5 g/dL (ref 3.3–5.5)
Alkaline Phosphatase: 67 U/L (ref 26–84)
Glucose, Bld: 313 mg/dL — ABNORMAL HIGH (ref 73–118)
Potassium: 4.5 mEq/L (ref 3.3–4.7)
Sodium: 140 mEq/L (ref 128–145)
Total Protein: 6.9 g/dL (ref 6.4–8.1)

## 2010-09-14 LAB — MORPHOLOGY: RBC Comments: NORMAL

## 2010-09-14 MED ORDER — IOHEXOL 300 MG/ML  SOLN
125.0000 mL | Freq: Once | INTRAMUSCULAR | Status: AC | PRN
  Administered 2010-09-14: 125 mL via INTRAVENOUS

## 2010-09-21 ENCOUNTER — Encounter: Payer: Medicare Other | Admitting: Oncology

## 2010-09-21 ENCOUNTER — Other Ambulatory Visit: Payer: Self-pay | Admitting: Oncology

## 2010-09-21 DIAGNOSIS — C8590 Non-Hodgkin lymphoma, unspecified, unspecified site: Secondary | ICD-10-CM

## 2010-09-29 ENCOUNTER — Encounter: Payer: Self-pay | Admitting: Physician Assistant

## 2010-10-15 ENCOUNTER — Ambulatory Visit: Payer: Medicare Other | Admitting: Cardiology

## 2010-11-08 ENCOUNTER — Other Ambulatory Visit: Payer: Self-pay | Admitting: Internal Medicine

## 2010-11-15 ENCOUNTER — Encounter: Payer: Self-pay | Admitting: Cardiovascular Disease

## 2010-11-17 ENCOUNTER — Encounter: Payer: Self-pay | Admitting: Cardiology

## 2010-11-17 ENCOUNTER — Ambulatory Visit (INDEPENDENT_AMBULATORY_CARE_PROVIDER_SITE_OTHER): Payer: Medicare Other | Admitting: Cardiology

## 2010-11-17 DIAGNOSIS — I4891 Unspecified atrial fibrillation: Secondary | ICD-10-CM

## 2010-11-17 DIAGNOSIS — E669 Obesity, unspecified: Secondary | ICD-10-CM

## 2010-11-17 DIAGNOSIS — I10 Essential (primary) hypertension: Secondary | ICD-10-CM

## 2010-11-17 MED ORDER — METOPROLOL TARTRATE 100 MG PO TABS: 100.0000 mg | ORAL_TABLET | Freq: Two times a day (BID) | ORAL | Status: DC

## 2010-11-17 NOTE — Assessment & Plan Note (Signed)
The blood pressure is at target. No change in medications is indicated. We will continue with therapeutic lifestyle changes (TLC).  

## 2010-11-17 NOTE — Patient Instructions (Signed)
Please change Metoprolol 200 mg once a day to 100 mg twice a day. Continue all other medications as listed.  Follow up in 1 year with Dr Antoine Poche.  You will receive a letter in the mail 2 months before you are due.  Please call us when you receive this letter to schedule your follow up appointment.

## 2010-11-17 NOTE — Progress Notes (Signed)
HPI Patient presents for routine followup. Since I last saw him he has had no new cardiovascular complaints. The patient denies any new symptoms such as chest discomfort, neck or arm discomfort. There has been no new shortness of breath, PND or orthopnea. There have been no reported palpitations, presyncope or syncope.  He tolerated dig and increased metoprolol which I started at the last visit when I noted PAF with rapid rate on an event monitor.    Allergies  Allergen Reactions  . Avapro (Irbesartan)   . Lipitor (Atorvastatin Calcium) Other (See Comments)    Leg pain    Current Outpatient Prescriptions  Medication Sig Dispense Refill  . ALPRAZolam (XANAX) 0.25 MG tablet Take 0.25 mg by mouth at bedtime as needed.        Marland Kitchen aspirin 81 MG EC tablet Take 81 mg by mouth daily.        . Cholecalciferol (VITAMIN D3) 3000 UNITS TABS Take 1 tablet by mouth daily.        . clarithromycin (BIAXIN) 500 MG tablet Take 500 mg by mouth 2 (two) times daily.        . digoxin (LANOXIN) 0.25 MG tablet Take 250 mcg by mouth daily.        . diphenhydrAMINE (SOMINEX) 25 MG tablet Take 25 mg by mouth as needed.        . enalapril (VASOTEC) 10 MG tablet Take 10 mg by mouth daily.       Marland Kitchen guaiFENesin-codeine (ROBITUSSIN AC) 100-10 MG/5ML syrup Take 5 mLs by mouth every 6 (six) hours as needed.        . insulin lispro protamine-insulin lispro (HUMALOG 75/25) (75-25) 100 UNIT/ML SUSP Inject 58 Units into the skin 2 (two) times daily with a meal.        . ipratropium-albuterol (DUONEB) 0.5-2.5 (3) MG/3ML SOLN Take 3 mLs by nebulization every 6 (six) hours as needed.        . metFORMIN (GLUCOPHAGE) 500 MG tablet Take 500 mg by mouth 3 (three) times daily with meals.        . metoprolol (TOPROL-XL) 200 MG 24 hr tablet Take 200 mg by mouth daily.        Marland Kitchen PROAIR HFA 108 (90 BASE) MCG/ACT inhaler INHALE TWO PUFFS 4 TIMES DAILY AS NEEDED  9 g  0  . simvastatin (ZOCOR) 80 MG tablet Take 80 mg by mouth at bedtime.          Marland Kitchen warfarin (COUMADIN) 5 MG tablet Take 5 mg by mouth daily. As directed         Past Medical History  Diagnosis Date  . Cancer   . Diabetes mellitus   . NHL (non-Hodgkin's lymphoma)     nhl dx 9/11  . Asthma   . Hyperlipidemia   . Obesity     exogenous  . Impotence   . Prostate cancer   . Wheezing   . Left knee pain   . BPH (benign prostatic hypertrophy)   . Colon polyps   . Diabetes mellitus  type 2   . COPD (chronic obstructive pulmonary disease)   . B-cell lymphoma   . A-fib   . Shingles   . AR (allergic rhinitis)   . GERD (gastroesophageal reflux disease)   . Hx of colonic polyps   . Arthritis   . Asthma   . HTN (hypertension)     x 3 years  . Pneumonia     Past Surgical History  Procedure  Date  . Appendectomy     ROS:  As stated in the HPI and negative for all other systems.  PHYSICAL EXAM BP 136/72  Pulse 94  Resp 18  Ht 5\' 11"  (1.803 m)  Wt 284 lb (128.822 kg)  BMI 39.61 kg/m2 GENERAL:  Well appearing HEENT:  Pupils equal round and reactive, fundi not visualized, oral mucosa unremarkable NECK:  No jugular venous distention, waveform within normal limits, carotid upstroke brisk and symmetric, no bruits, no thyromegaly LYMPHATICS:  No cervical, inguinal adenopathy LUNGS:  Clear to auscultation bilaterally BACK:  No CVA tenderness CHEST:  Unremarkable HEART:  PMI not displaced or sustained,S1 and S2 within normal limits, no S3, no S4, no clicks, no rubs, no murmurs ABD:  Flat, positive bowel sounds normal in frequency in pitch, no bruits, no rebound, no guarding, no midline pulsatile mass, no hepatomegaly, no splenomegaly EXT:  2 plus pulses throughout, no edema, no cyanosis no clubbing SKIN:  No rashes no nodules NEURO:  Cranial nerves II through XII grossly intact, motor grossly intact throughout [facility]:  Cognitively intact, oriented to person place and time   EKG:  11/08/10  Sinus rhythm , rate  74, axis within normal limits, intervals within  normal limits, no acute ST-T wave changes.   ASSESSMENT AND PLAN

## 2010-11-17 NOTE — Assessment & Plan Note (Signed)
He has no symptomatic paroxysms.  He tolerates anticoagulation. We will continue with the meds as listed.

## 2010-11-17 NOTE — Assessment & Plan Note (Signed)
The patient understands the need to lose weight with diet and exercise. We have discussed specific strategies for this.  

## 2010-11-18 ENCOUNTER — Encounter: Payer: Self-pay | Admitting: Cardiology

## 2010-12-03 ENCOUNTER — Other Ambulatory Visit: Payer: Self-pay | Admitting: Internal Medicine

## 2010-12-28 ENCOUNTER — Other Ambulatory Visit: Payer: Self-pay | Admitting: Oncology

## 2010-12-28 ENCOUNTER — Ambulatory Visit (HOSPITAL_COMMUNITY)
Admission: RE | Admit: 2010-12-28 | Discharge: 2010-12-28 | Disposition: A | Discharge status: Home / Self Care | Payer: Medicare Other | Source: Ambulatory Visit | Attending: Oncology | Admitting: Oncology

## 2010-12-28 ENCOUNTER — Encounter (HOSPITAL_BASED_OUTPATIENT_CLINIC_OR_DEPARTMENT_OTHER): Payer: Medicare Other | Admitting: Oncology

## 2010-12-28 DIAGNOSIS — M47817 Spondylosis without myelopathy or radiculopathy, lumbosacral region: Secondary | ICD-10-CM | POA: Insufficient documentation

## 2010-12-28 DIAGNOSIS — I2510 Atherosclerotic heart disease of native coronary artery without angina pectoris: Secondary | ICD-10-CM | POA: Insufficient documentation

## 2010-12-28 DIAGNOSIS — C8590 Non-Hodgkin lymphoma, unspecified, unspecified site: Secondary | ICD-10-CM

## 2010-12-28 DIAGNOSIS — C8589 Other specified types of non-Hodgkin lymphoma, extranodal and solid organ sites: Secondary | ICD-10-CM | POA: Insufficient documentation

## 2010-12-28 DIAGNOSIS — J984 Other disorders of lung: Secondary | ICD-10-CM | POA: Insufficient documentation

## 2010-12-28 DIAGNOSIS — M47814 Spondylosis without myelopathy or radiculopathy, thoracic region: Secondary | ICD-10-CM | POA: Insufficient documentation

## 2010-12-28 DIAGNOSIS — C8583 Other specified types of non-Hodgkin lymphoma, intra-abdominal lymph nodes: Secondary | ICD-10-CM

## 2010-12-28 DIAGNOSIS — K8020 Calculus of gallbladder without cholecystitis without obstruction: Secondary | ICD-10-CM | POA: Insufficient documentation

## 2010-12-28 DIAGNOSIS — Q619 Cystic kidney disease, unspecified: Secondary | ICD-10-CM | POA: Insufficient documentation

## 2010-12-28 LAB — CBC WITH DIFFERENTIAL/PLATELET
Basophils Absolute: 0 10*3/uL (ref 0.0–0.1)
Eosinophils Absolute: 0.1 10*3/uL (ref 0.0–0.5)
HGB: 14.3 g/dL (ref 13.0–17.1)
LYMPH%: 25.9 % (ref 14.0–49.0)
MCV: 89.4 fL (ref 79.3–98.0)
MONO#: 0.4 10*3/uL (ref 0.1–0.9)
MONO%: 6.9 % (ref 0.0–14.0)
NEUT#: 3.7 10*3/uL (ref 1.5–6.5)
Platelets: 170 10*3/uL (ref 140–400)
WBC: 5.7 10*3/uL (ref 4.0–10.3)

## 2010-12-28 LAB — CMP (CANCER CENTER ONLY)
ALT(SGPT): 20 U/L (ref 10–47)
AST: 23 U/L (ref 11–38)
Albumin: 3.3 g/dL (ref 3.3–5.5)
Alkaline Phosphatase: 66 U/L (ref 26–84)
BUN, Bld: 18 mg/dL (ref 7–22)
CO2: 31 mEq/L (ref 18–33)
Calcium: 9.1 mg/dL (ref 8.0–10.3)
Chloride: 95 mEq/L — ABNORMAL LOW (ref 98–108)
Creat: 0.9 mg/dl (ref 0.6–1.2)
Glucose, Bld: 161 mg/dL — ABNORMAL HIGH (ref 73–118)
Potassium: 4.7 mEq/L (ref 3.3–4.7)
Sodium: 137 mEq/L (ref 128–145)
Total Bilirubin: 1.6 mg/dl (ref 0.20–1.60)
Total Protein: 6.5 g/dL (ref 6.4–8.1)

## 2010-12-28 LAB — MORPHOLOGY

## 2010-12-28 LAB — SEDIMENTATION RATE: Sed Rate: 6 mm/hr (ref 0–16)

## 2010-12-28 MED ORDER — IOHEXOL 300 MG/ML  SOLN
100.0000 mL | Freq: Once | INTRAMUSCULAR | Status: AC | PRN
  Administered 2010-12-28: 100 mL via INTRAVENOUS

## 2011-01-10 ENCOUNTER — Other Ambulatory Visit: Payer: Self-pay | Admitting: Oncology

## 2011-01-10 ENCOUNTER — Encounter: Payer: Medicare Other | Admitting: Oncology

## 2011-01-10 DIAGNOSIS — C8590 Non-Hodgkin lymphoma, unspecified, unspecified site: Secondary | ICD-10-CM

## 2011-01-12 ENCOUNTER — Telehealth: Payer: Self-pay | Admitting: Cardiology

## 2011-01-12 NOTE — Telephone Encounter (Signed)
Forwarded to Dr Hochrein for review 

## 2011-01-20 LAB — COMPREHENSIVE METABOLIC PANEL
ALT: 28
AST: 24
Albumin: 3.7
CO2: 29
Calcium: 9.5
Creatinine, Ser: 0.88
GFR calc Af Amer: >60
GFR calc non Af Amer: >60
Sodium: 135
Total Protein: 6.9

## 2011-01-20 LAB — TSH: TSH: 0.369

## 2011-01-20 LAB — DIFFERENTIAL
Eosinophils Absolute: 0.1
Eosinophils Relative: 1
Lymphocytes Relative: 10 — ABNORMAL LOW
Lymphs Abs: 1.2
Monocytes Absolute: 0.5
Monocytes Relative: 4

## 2011-01-20 LAB — CBC
MCHC: 34.1
MCV: 87.2
Platelets: 190
RBC: 5.13

## 2011-01-20 LAB — CK TOTAL AND CKMB (NOT AT ARMC)
Relative Index: 2.8 — ABNORMAL HIGH
Total CK: 116

## 2011-01-20 LAB — APTT: aPTT: 28

## 2011-01-20 LAB — CULTURE, BLOOD (ROUTINE X 2): Culture: NO GROWTH

## 2011-01-20 LAB — B-NATRIURETIC PEPTIDE (CONVERTED LAB): Pro B Natriuretic peptide (BNP): 99

## 2011-01-20 LAB — PROTIME-INR: INR: 1

## 2011-03-08 ENCOUNTER — Encounter: Payer: Self-pay | Admitting: Cardiology

## 2011-04-23 ENCOUNTER — Telehealth: Payer: Self-pay | Admitting: Oncology

## 2011-04-23 NOTE — Telephone Encounter (Signed)
S/w the pt's wife and she is aware bof the lab appt added on before the scan on 05/11/2011

## 2011-05-11 ENCOUNTER — Other Ambulatory Visit (HOSPITAL_BASED_OUTPATIENT_CLINIC_OR_DEPARTMENT_OTHER): Payer: Medicare Other | Admitting: Lab

## 2011-05-11 ENCOUNTER — Telehealth: Payer: Self-pay | Admitting: *Deleted

## 2011-05-11 ENCOUNTER — Ambulatory Visit (HOSPITAL_COMMUNITY)
Admission: RE | Admit: 2011-05-11 | Discharge: 2011-05-11 | Disposition: A | Discharge status: Home / Self Care | Payer: Medicare Other | Source: Ambulatory Visit | Attending: Oncology | Admitting: Oncology

## 2011-05-11 DIAGNOSIS — C8590 Non-Hodgkin lymphoma, unspecified, unspecified site: Secondary | ICD-10-CM

## 2011-05-11 DIAGNOSIS — K8020 Calculus of gallbladder without cholecystitis without obstruction: Secondary | ICD-10-CM | POA: Insufficient documentation

## 2011-05-11 DIAGNOSIS — C8589 Other specified types of non-Hodgkin lymphoma, extranodal and solid organ sites: Secondary | ICD-10-CM | POA: Insufficient documentation

## 2011-05-11 DIAGNOSIS — R911 Solitary pulmonary nodule: Secondary | ICD-10-CM | POA: Insufficient documentation

## 2011-05-11 DIAGNOSIS — Z452 Encounter for adjustment and management of vascular access device: Secondary | ICD-10-CM

## 2011-05-11 DIAGNOSIS — C8583 Other specified types of non-Hodgkin lymphoma, intra-abdominal lymph nodes: Secondary | ICD-10-CM

## 2011-05-11 LAB — CBC WITH DIFFERENTIAL/PLATELET
BASO%: 0.6 % (ref 0.0–2.0)
EOS%: 1.5 % (ref 0.0–7.0)
MCH: 30.9 pg (ref 27.2–33.4)
MCHC: 34.5 g/dL (ref 32.0–36.0)
MONO#: 0.4 10*3/uL (ref 0.1–0.9)
RDW: 13.8 % (ref 11.0–14.6)
WBC: 5.8 10*3/uL (ref 4.0–10.3)
lymph#: 1.5 10*3/uL (ref 0.9–3.3)

## 2011-05-11 LAB — CMP (CANCER CENTER ONLY)
ALT(SGPT): 22 U/L (ref 10–47)
AST: 21 U/L (ref 11–38)
Albumin: 3.3 g/dL (ref 3.3–5.5)
Calcium: 9.1 mg/dL (ref 8.0–10.3)
Chloride: 99 mEq/L (ref 98–108)
Creat: 0.7 mg/dl (ref 0.6–1.2)
Potassium: 4.6 mEq/L (ref 3.3–4.7)
Sodium: 142 mEq/L (ref 128–145)
Total Protein: 6.6 g/dL (ref 6.4–8.1)

## 2011-05-11 MED ORDER — IOHEXOL 300 MG/ML  SOLN
125.0000 mL | Freq: Once | INTRAMUSCULAR | Status: AC | PRN
  Administered 2011-05-11: 125 mL via INTRAVENOUS

## 2011-05-11 NOTE — Telephone Encounter (Signed)
Pt notified that recent CT negative for recurrence per Dr. Cyndie Chime & this will be routed electronically to Dr.Donald Christell Constant.

## 2011-06-10 ENCOUNTER — Encounter: Payer: Self-pay | Admitting: Cardiology

## 2011-07-01 ENCOUNTER — Ambulatory Visit (HOSPITAL_BASED_OUTPATIENT_CLINIC_OR_DEPARTMENT_OTHER): Payer: Medicare Other | Admitting: Oncology

## 2011-07-01 ENCOUNTER — Telehealth: Payer: Self-pay | Admitting: Oncology

## 2011-07-01 VITALS — BP 129/81 | HR 74 | Temp 98.0°F | Ht 71.0 in | Wt 318.3 lb

## 2011-07-01 DIAGNOSIS — Z9221 Personal history of antineoplastic chemotherapy: Secondary | ICD-10-CM

## 2011-07-01 DIAGNOSIS — C8589 Other specified types of non-Hodgkin lymphoma, extranodal and solid organ sites: Secondary | ICD-10-CM

## 2011-07-01 DIAGNOSIS — J8410 Pulmonary fibrosis, unspecified: Secondary | ICD-10-CM

## 2011-07-01 DIAGNOSIS — Z87898 Personal history of other specified conditions: Secondary | ICD-10-CM

## 2011-07-01 NOTE — Telephone Encounter (Signed)
gv pt appt for [id number].  scheduled pt for ct scan on 07/09

## 2011-07-03 NOTE — Progress Notes (Signed)
Hematology and Oncology Follow Up Visit  Justin Ruiz [id number] 1951-02-21 61 y.o. 07/03/2011 12:24 PM   Principle Diagnosis: Encounter Diagnosis  Name Primary?  Marland Kitchen LYMPHOMA Yes     Interim History:    Follow-up visit for this pleasant 61 year old man diagnosed with high-grade B-cell non-Hodgkin's lymphoma in September of last year, when he was found to have retroperitoneal lymphadenopathy on a CT scan of the chest done for evaluation of some small pulmonary nodules.  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 I elected to treat him with a more aggressive regimen than the standard CHOP, and he received Rituxan with infusional CHOPE.  He did achieve a complete response by PET scan criteria with that scan done following completion of treatment on 06/09/2010.  He still has some minimal residual adenopathy on CT scan which did not take up the PET tracer.  This has continued to shrink over time, down from initial measurements of 5 x 3 cm to measurement on the  scan done on 09/14/2010 of 2.8 x 1.5 cm.   He has a number of subcentimeter pulmonary nodules, the majority of which appeared the same. On the most recent study done 05/11/2011 which I personally reviewed, there are stable granulomas in the lungs but no adenopathy, there are no new or enlarging lymph nodes in the upper abdomen and no pelvic or inguinal adenopathy. Largest node detected his a retrocaval node that measures 1.6 cm. He is asymptomatic. He has had no interim medical problems. No infections.  Medications: reviewed  Allergies:  Allergies  Allergen Reactions  . Avapro (Irbesartan)   . Lipitor (Atorvastatin Calcium) Other (See Comments)    Leg pain    Review of Systems: Constitutional:   No constitutional symptoms Respiratory: No cough or dyspnea Cardiovascular:   No chest pain or palpitations Gastrointestinal: No abdominal pain no change in bowel but Genito-Urinary: No urinary tract symptoms Musculoskeletal: No bone pain Neurologic: No headache or change in vision Skin: No rash or ecchymosis Remaining ROS negative.  Physical Exam: Blood pressure 129/81, pulse 74, temperature 98 F (36.7 C), temperature source Oral, height 5\' 11"  (1.803 m), weight 318 lb 4.8 oz (144.38 kg). Wt Readings from Last 3 Encounters:  07/01/11 318 lb 4.8 oz (144.38 kg)  11/17/10 284 lb (128.822 kg)  06/28/10 299 lb (135.626 kg)     General appearance: Well-nourished Caucasian man HENNT: Pharynx no erythema or exudate Lymph nodes: No cervical supraclavicular axillary or inguinal adenopathy Breasts: Lungs: Clear to auscultation resonant to percussion Heart: Regular rhythm no murmur Abdomen: Soft nontender no mass no organomegaly Extremities: No edema no calf tenderness Vascular: No cyanosis Neurologic: Motor strength 5 over 5 reflexes 1+ symmetric Skin: No rash or ecchymosis  Lab Results: Lab Results  Component Value Date   WBC 5.8 05/11/2011   HGB 14.9 05/11/2011   HCT 43.1 05/11/2011   MCV 89.5 05/11/2011   PLT 164 05/11/2011     Chemistry      Component Value Date/Time   NA 142 05/11/2011 0758   NA 135 07/16/2010 1315   NA 135 07/16/2010 1315   K 4.6 05/11/2011 0758   K 4.1 07/16/2010 1315   K 4.1 07/16/2010 1315   CL 99 05/11/2011 0758   CL 100 07/16/2010 1315   CL 100 07/16/2010 1315   CO2 31 05/11/2011 0758   CO2 27 07/16/2010 1315  CO2 27 07/16/2010 1315   BUN 18 05/11/2011 0758   BUN 18 07/16/2010 1315   BUN 18 07/16/2010 1315   CREATININE 0.7 05/11/2011 0758   CREATININE 0.78 07/16/2010 1315   CREATININE 0.78 07/16/2010 1315      Component Value Date/Time   CALCIUM 9.1 05/11/2011 0758   CALCIUM 10.1 07/16/2010 1315   CALCIUM 10.1 07/16/2010 1315   ALKPHOS 61 05/11/2011 0758   ALKPHOS 68 07/16/2010 1315   ALKPHOS 68 07/16/2010 1315   AST 21 05/11/2011 0758     AST 21 07/16/2010 1315   AST 21 07/16/2010 1315   ALT 22 07/16/2010 1315   ALT 22 07/16/2010 1315   BILITOT 1.40 05/11/2011 0758   BILITOT 1.3* 07/16/2010 1315   BILITOT 1.3* 07/16/2010 1315       Radiological Studies: See discussion above   Impression and Plan: #1. 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 He remains in a clinical and radiographic remission now out 1-1/2 years from diagnosis in September of 2011. I will continue every 4 month CT scans until 2 years and then every 6 months for the third year annual times year 4 and 5 then on a when necessary basis only.  #2. Pulmonary granulomas. Stable on serial radiographs.  #3. Insulin-dependent diabetes.  #4. Obesity sleep apnea syndrome.  #5. Herpes zoster infection right sacral dermatome may 2012  #6. Obstructive airway disease  #7. Coronary artery disease  #8. Hypertension.  #9. Hyperlipidemia   CC:.    Levert Feinstein, MD 3/10/201312:24 PM

## 2011-09-14 LAB — TSH: TSH: 1.46 u[IU]/mL (ref <=5.90)

## 2011-10-24 ENCOUNTER — Telehealth: Payer: Self-pay | Admitting: Internal Medicine

## 2011-10-24 MED ORDER — ALBUTEROL SULFATE HFA 108 (90 BASE) MCG/ACT IN AERS: 2.0000 | INHALATION_SPRAY | Freq: Four times a day (QID) | RESPIRATORY_TRACT | Status: DC | PRN

## 2011-10-24 NOTE — Telephone Encounter (Signed)
Spoke with pt's spouse and advised refill on proair was sent, and should keep rov in August for future rx. She verbalized understanding and states nothing further needed.

## 2011-11-01 ENCOUNTER — Other Ambulatory Visit (HOSPITAL_BASED_OUTPATIENT_CLINIC_OR_DEPARTMENT_OTHER): Payer: Medicare Other | Admitting: Lab

## 2011-11-01 ENCOUNTER — Ambulatory Visit (HOSPITAL_COMMUNITY)
Admission: RE | Admit: 2011-11-01 | Discharge: 2011-11-01 | Disposition: A | Discharge status: Home / Self Care | Payer: Medicare Other | Source: Ambulatory Visit | Attending: Oncology | Admitting: Oncology

## 2011-11-01 DIAGNOSIS — J449 Chronic obstructive pulmonary disease, unspecified: Secondary | ICD-10-CM | POA: Insufficient documentation

## 2011-11-01 DIAGNOSIS — C8589 Other specified types of non-Hodgkin lymphoma, extranodal and solid organ sites: Secondary | ICD-10-CM

## 2011-11-01 DIAGNOSIS — I2510 Atherosclerotic heart disease of native coronary artery without angina pectoris: Secondary | ICD-10-CM | POA: Insufficient documentation

## 2011-11-01 DIAGNOSIS — R599 Enlarged lymph nodes, unspecified: Secondary | ICD-10-CM | POA: Insufficient documentation

## 2011-11-01 DIAGNOSIS — R911 Solitary pulmonary nodule: Secondary | ICD-10-CM | POA: Insufficient documentation

## 2011-11-01 DIAGNOSIS — K8020 Calculus of gallbladder without cholecystitis without obstruction: Secondary | ICD-10-CM | POA: Insufficient documentation

## 2011-11-01 DIAGNOSIS — J4489 Other specified chronic obstructive pulmonary disease: Secondary | ICD-10-CM | POA: Insufficient documentation

## 2011-11-01 LAB — CBC WITH DIFFERENTIAL/PLATELET
Eosinophils Absolute: 0.1 10*3/uL (ref 0.0–0.5)
LYMPH%: 21.7 % (ref 14.0–49.0)
MCH: 31.4 pg (ref 27.2–33.4)
MCV: 92.5 fL (ref 79.3–98.0)
MONO%: 7.9 % (ref 0.0–14.0)
NEUT#: 5.9 10*3/uL (ref 1.5–6.5)
Platelets: 164 10*3/uL (ref 140–400)
RBC: 4.66 10*6/uL (ref 4.20–5.82)

## 2011-11-01 LAB — CMP (CANCER CENTER ONLY)
AST: 14 U/L (ref 11–38)
BUN, Bld: 18 mg/dL (ref 7–22)
CO2: 34 mEq/L — ABNORMAL HIGH (ref 18–33)
Calcium: 8.8 mg/dL (ref 8.0–10.3)
Chloride: 91 mEq/L — ABNORMAL LOW (ref 98–108)
Creat: 0.9 mg/dl (ref 0.6–1.2)

## 2011-11-01 LAB — SEDIMENTATION RATE: Sed Rate: 30 mm/hr — ABNORMAL HIGH (ref 0–16)

## 2011-11-01 MED ORDER — IOHEXOL 300 MG/ML  SOLN
100.0000 mL | Freq: Once | INTRAMUSCULAR | Status: AC | PRN
  Administered 2011-11-01: 100 mL via INTRAVENOUS

## 2011-11-07 ENCOUNTER — Telehealth: Payer: Self-pay | Admitting: Oncology

## 2011-11-07 ENCOUNTER — Ambulatory Visit (HOSPITAL_BASED_OUTPATIENT_CLINIC_OR_DEPARTMENT_OTHER): Payer: Medicare Other | Admitting: Oncology

## 2011-11-07 VITALS — BP 116/71 | HR 75 | Temp 97.8°F | Ht 71.0 in | Wt 322.5 lb

## 2011-11-07 DIAGNOSIS — C8510 Unspecified B-cell lymphoma, unspecified site: Secondary | ICD-10-CM

## 2011-11-07 DIAGNOSIS — R918 Other nonspecific abnormal finding of lung field: Secondary | ICD-10-CM

## 2011-11-07 DIAGNOSIS — C8589 Other specified types of non-Hodgkin lymphoma, extranodal and solid organ sites: Secondary | ICD-10-CM

## 2011-11-07 NOTE — Telephone Encounter (Signed)
appts made and printed for pt aom °

## 2011-11-09 NOTE — Progress Notes (Signed)
Hematology and Oncology Follow Up Visit  Justin Ruiz [id number] 10-07-1950 61 y.o. 11/09/2011 9:08 AM   Principle Diagnosis: Encounter Diagnoses  Name Primary?  . High Grade B-Cell Lymphoma Yes  . LYMPHOMA   . Pulmonary infiltrate      Interim History:   Follow-up visit for this pleasant 61 year old man diagnosed with high-grade B-cell non-Hodgkin's lymphoma in September of last year, when he was found to have retroperitoneal lymphadenopathy on a CT scan of the chest done for evaluation of some small pulmonary nodules. 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 I elected to treat him with a more aggressive regimen than the standard CHOP, and he received Rituxan with infusional CHOPE. He did achieve a complete response by PET scan criteria with that scan done following completion of treatment on 06/09/2010. He still has some minimal residual adenopathy on CT scan which did not take up the PET tracer. This has continued to shrink over time, down from initial measurements of 5 x 3 cm to measurement on the scan done on 09/14/2010 of 2.8 x 1.5 cm. He has a number of subcentimeter pulmonary nodules, the majority of which appeared the same. On the most recent study done 11/01/2011 which I personally reviewed, I am not able to appreciate any significant change compared with the study done 12/28/2010. There is a patchy left lower lobe pleural-based process not seen on the prior study which appears infections. He has had no signs or symptoms of any recent bronchitis or pneumonia. This infiltrate does not look suspicious for lymphoma. Radiologist's reset his mediastinal lymph nodes are slightly increased in size compared with prior exam but within normal limits. Of note to my eye the mediastinum looks normal and he never had disease at this site to  begin with. In the abdomen, incidentally noted are multiple gallstones. A residual 1.4 cm aortocaval lymph node is unchanged from prior study.  He is asymptomatic. He has had no interim medical problems. No infections. Blood sugars have been under good control. He has not had any cardiac symptoms and denies any chest pain, pressure, or palpitations.   Medications: reviewed  Allergies:  Allergies  Allergen Reactions  . Avapro (Irbesartan)   . Lipitor (Atorvastatin Calcium) Other (See Comments)    Leg pain    Review of Systems: Constitutional:  No constitutional symptoms  Respiratory: Dyspnea on exertion which is chronic no dyspnea at rest no PND or orthopnea no cough Cardiovascular:  See above Gastrointestinal: No change in bowel habit. Occasional constipation relieved with laxatives Genito-Urinary: No urinary tract symptoms Musculoskeletal: No muscle or bone pain Neurologic: No headache or change in vision Skin: No rash Remaining ROS negative.  Physical Exam: Blood pressure 116/71, pulse 75, temperature 97.8 F (36.6 C), temperature source Oral, height 5\' 11"  (1.803 m), weight 322 lb 8 oz (146.285 kg). Wt Readings from Last 3 Encounters:  11/07/11 322 lb 8 oz (146.285 kg)  07/01/11 318 lb 4.8 oz (144.38 kg)  11/17/10 284 lb (128.822 kg)     General appearance: Well-nourished Caucasian man HENNT: Pharynx no erythema or exudate Lymph nodes: No cervical supraclavicular or axillary adenopathy. No inguinal adenopathy. Breasts: Lungs: Clear to auscultation resonant to percussion Heart: Regular rhythm no murmur or gallop Abdomen: Soft obese nontender no mass no organomegaly Extremities: No edema no calf tenderness Vascular: No cyanosis Neurologic: Motor strength 5 over 5 reflexes  1+ symmetric Skin: No rash or ecchymosis  Lab Results: Lab Results  Component Value Date   WBC 8.7 11/01/2011   HGB 14.6 11/01/2011   HCT 43.2 11/01/2011   MCV 92.5 11/01/2011   PLT 164 11/01/2011      Chemistry      Component Value Date/Time   NA 136 11/01/2011 0753   NA 135 07/16/2010 1315   NA 135 07/16/2010 1315   K 4.8* 11/01/2011 0753   K 4.1 07/16/2010 1315   K 4.1 07/16/2010 1315   CL 91* 11/01/2011 0753   CL 100 07/16/2010 1315   CL 100 07/16/2010 1315   CO2 34* 11/01/2011 0753   CO2 27 07/16/2010 1315   CO2 27 07/16/2010 1315   BUN 18 11/01/2011 0753   BUN 18 07/16/2010 1315   BUN 18 07/16/2010 1315   CREATININE 0.9 11/01/2011 0753   CREATININE 0.78 07/16/2010 1315   CREATININE 0.78 07/16/2010 1315      Component Value Date/Time   CALCIUM 8.8 11/01/2011 0753   CALCIUM 10.1 07/16/2010 1315   CALCIUM 10.1 07/16/2010 1315   ALKPHOS 57 11/01/2011 0753   ALKPHOS 68 07/16/2010 1315   ALKPHOS 68 07/16/2010 1315   AST 14 11/01/2011 0753   AST 21 07/16/2010 1315   AST 21 07/16/2010 1315   ALT 22 07/16/2010 1315   ALT 22 07/16/2010 1315   BILITOT 1.50 11/01/2011 0753   BILITOT 1.3* 07/16/2010 1315   BILITOT 1.3* 07/16/2010 1315       Radiological Studies: Ct Chest W Contrast  11/01/2011  *RADIOLOGY REPORT*  Clinical Data:  Completed chemotherapy.  Non-Hodgkins lymphoma. COPD.  CT CHEST AND ABDOMEN WITH CONTRAST  Technique:  Multidetector CT imaging of the chest and abdomen was performed following the standard protocol during bolus administration of intravenous contrast.  Contrast: OMNIPAQUE IOHEXOL 300 MG/ML  SOLN  Comparison:  05/11/2011  CT CHEST  Findings:  Scattered small mediastinal lymph nodes are subjectively larger than on the prior exam but not pathologically enlarged by size criteria.  Coronary artery atherosclerosis noted.  No pathologic axillary adenopathy is evident.  Airspace opacity in the posterior basal segment left lower lobe is nonspecific but could represent pneumonia or hemorrhage.  Scarring along the right hemidiaphragm is noted.  Several tiny nodules include a 3 mm subpleural nodule in the right middle lobe and a 2 mm nodule in the right lower lobe, both visible on image 33 of series  5; these are essentially stable from 05/11/2011 and reduced in size compared to 11/25/2009.  IMPRESSION:  1.  New airspace opacity in the left lower lobe - pneumonia or pulmonary hemorrhage could give a similar appearance. Follow-up chest radiography to ensure clearance is recommended. 2.  Several small right lung nodules are stable and nonspecific due to small size. 3.  Mediastinal lymph nodes are slightly increased in size compared the prior exam, but currently within normal size limits. 4.  Atherosclerosis.  CT ABDOMEN  Findings:  The liver, spleen, pancreas, and adrenal glands appear unremarkable.  Cholelithiasis noted with multiple gallstones measuring up to 1.1 cm in diameter.  No biliary dilatation noted.  The kidneys appear unremarkable, as do the proximal ureters.  Small porta hepatis, small porta hepatis and peripancreatic lymph nodes noted.  An aortocaval node has a short axis diameter of 1.4 cm (previously by my measurement this same).  IMPRESSION:  1.  Stable mildly enlarged aortocaval lymph node. 2.  Cholelithiasis.  Original Report Authenticated By: Soyla Murphy.  Ova Freshwater, M.D.   Ct Abdomen W Contrast  11/01/2011  *RADIOLOGY REPORT*  Clinical Data:  Completed chemotherapy.  Non-Hodgkins lymphoma. COPD.  CT CHEST AND ABDOMEN WITH CONTRAST  Technique:  Multidetector CT imaging of the chest and abdomen was performed following the standard protocol during bolus administration of intravenous contrast.  Contrast: OMNIPAQUE IOHEXOL 300 MG/ML  SOLN  Comparison:  05/11/2011  CT CHEST  Findings:  Scattered small mediastinal lymph nodes are subjectively larger than on the prior exam but not pathologically enlarged by size criteria.  Coronary artery atherosclerosis noted.  No pathologic axillary adenopathy is evident.  Airspace opacity in the posterior basal segment left lower lobe is nonspecific but could represent pneumonia or hemorrhage.  Scarring along the right hemidiaphragm is noted.  Several tiny  nodules include a 3 mm subpleural nodule in the right middle lobe and a 2 mm nodule in the right lower lobe, both visible on image 33 of series 5; these are essentially stable from 05/11/2011 and reduced in size compared to 11/25/2009.  IMPRESSION:  1.  New airspace opacity in the left lower lobe - pneumonia or pulmonary hemorrhage could give a similar appearance. Follow-up chest radiography to ensure clearance is recommended. 2.  Several small right lung nodules are stable and nonspecific due to small size. 3.  Mediastinal lymph nodes are slightly increased in size compared the prior exam, but currently within normal size limits. 4.  Atherosclerosis.  CT ABDOMEN  Findings:  The liver, spleen, pancreas, and adrenal glands appear unremarkable.  Cholelithiasis noted with multiple gallstones measuring up to 1.1 cm in diameter.  No biliary dilatation noted.  The kidneys appear unremarkable, as do the proximal ureters.  Small porta hepatis, small porta hepatis and peripancreatic lymph nodes noted.  An aortocaval node has a short axis diameter of 1.4 cm (previously by my measurement this same).  IMPRESSION:  1.  Stable mildly enlarged aortocaval lymph node. 2.  Cholelithiasis.  Original Report Authenticated By: Dellia Cloud, M.D.    Impression and Plan:  #1. 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 He remains in a clinical and radiographic remission now out almost 2 years from diagnosis in September of 2011.  I will continue every 4 month CT scans until 2 years and then every 6 months for the third year annual times year 4 and 5 then on a when necessary basis only.  #2. Pulmonary granulomas. Stable on serial radiographs.  #3. Insulin-dependent diabetes.  #4. Obesity sleep apnea syndrome.  #5. Herpes zoster infection right sacral dermatome may 2012  #6. Obstructive airway disease  #7. Coronary artery disease  #8. Hypertension.  #9. Hyperlipidemia #10.  Atypical pulmonary infiltrate left lower lobe. He is asymptomatic. Plan at this point is followup on routine CT scan unless clinical symptoms develop.    CC:. Dr. Rudi Heap   Levert Feinstein, MD 7/17/20139:08 AM

## 2011-11-25 ENCOUNTER — Other Ambulatory Visit: Payer: Self-pay | Admitting: Cardiology

## 2011-11-25 NOTE — Telephone Encounter (Signed)
..   Requested Prescriptions   Pending Prescriptions Disp Refills  . metoprolol (LOPRESSOR) 100 MG tablet [Pharmacy Med Name: METOPROLOL TART 100MG  TAB] 60 tablet 2    Sig: TAKE ONE TABLET BY MOUTH TWICE DAILY  Please call the office to make appointment

## 2011-12-02 ENCOUNTER — Ambulatory Visit (INDEPENDENT_AMBULATORY_CARE_PROVIDER_SITE_OTHER): Payer: Medicare Other | Admitting: Internal Medicine

## 2011-12-02 ENCOUNTER — Encounter: Payer: Self-pay | Admitting: Internal Medicine

## 2011-12-02 VITALS — BP 118/68 | HR 80 | Ht 71.0 in | Wt 327.6 lb

## 2011-12-02 DIAGNOSIS — J309 Allergic rhinitis, unspecified: Secondary | ICD-10-CM

## 2011-12-02 DIAGNOSIS — J449 Chronic obstructive pulmonary disease, unspecified: Secondary | ICD-10-CM

## 2011-12-02 MED ORDER — IPRATROPIUM-ALBUTEROL 0.5-2.5 (3) MG/3ML IN SOLN: 3.0000 mL | Freq: Four times a day (QID) | RESPIRATORY_TRACT | Status: DC | PRN

## 2011-12-02 MED ORDER — ALBUTEROL SULFATE HFA 108 (90 BASE) MCG/ACT IN AERS: 2.0000 | INHALATION_SPRAY | Freq: Four times a day (QID) | RESPIRATORY_TRACT | Status: DC | PRN

## 2011-12-02 NOTE — Assessment & Plan Note (Signed)
Non-sedating antihistamines sufficient this year.

## 2011-12-02 NOTE — Patient Instructions (Addendum)
Scripts to refill neb solution and rescue inhaler.   You can ask the pharmacy to put them on hold until you need.  Please call as needed.

## 2011-12-02 NOTE — Assessment & Plan Note (Signed)
Severe COPD, clinically controlled. Recognize dyspnea also reflects his obesity/ hypoventilation, deconditioning and cardiac disease.  He tolerates ACE inhibitor without routine cough or angioedema. Plan- refill rescue and nebulizer meds.

## 2011-12-02 NOTE — Progress Notes (Signed)
12/02/11- 60 yoM former smoker followed for COPD, OSA, complicated by hx lymphoma, AFib/ coumadin PFT 11/23/06- 0.47.   Hx 35 pk yr cigs.  LOV- 06/28/10  Patient states doing "pretty good". c/o sob with exertion. denies chest pain, chest tightness, wheezing, and cough. DOE especially on stairs- no change. Uses neb 1-2x/ yr. Seasonal pollens bother some.  OSA status not addressed this visit. COPD Assessment Test( CAT) 16/24  ROS-see HPI Constitutional:   No-   weight loss, night sweats, fevers, chills, fatigue, lassitude. HEENT:   No-  headaches, difficulty swallowing, tooth/dental problems, sore throat,       +seasonal sneezing, itching, ear ache, nasal congestion, post nasal drip,  CV:  No-   chest pain, orthopnea, PND, swelling in lower extremities, anasarca, dizziness, palpitations Resp: +  shortness of breath with exertion or at rest.              No-   productive cough,  No non-productive cough,  No- coughing up of blood.              No-   change in color of mucus.  No- wheezing.   Skin: No-   rash or lesions. GI:  No-   heartburn, indigestion, abdominal pain, nausea, vomiting, GU:  MS:  No-   joint pain or swelling.  Neuro-     nothing unusual Psych:  No- change in mood or affect. No depression or anxiety.  No memory loss.  OBJ- Physical Exam General- Alert, Oriented, Affect-appropriate, Distress- none acute, significant obesity Skin- rash-none, lesions- none, excoriation- none Lymphadenopathy- none Head- atraumatic            Eyes- Gross vision intact, PERRLA, conjunctivae and secretions clear            Ears- Hearing, canals-normal            Nose- Clear, no-Septal dev, mucus, polyps, erosion, perforation             Throat- Mallampati II , mucosa clear , drainage- none, tonsils- atrophic Neck- flexible , trachea midline, no stridor , thyroid nl, carotid no bruit Chest - symmetrical excursion , unlabored           Heart/CV- RRR , no murmur , no gallop  , no rub, nl s1 s2                     - JVD- none , edema- none, stasis changes- none, varices- none           Lung- clear to P&A, wheeze- none, cough- none , dullness-none, rub- none           Chest wall-  Abd-  Br/ Gen/ Rectal- Not done, not indicated Extrem- cyanosis- none, clubbing, none, atrophy- none, strength- nl Neuro- grossly intact to observation

## 2011-12-31 IMAGING — CT CT BIOPSY
1 of 5 series · 13 of 32 positions shown, 19 images · non-contrast
Comparison: none

CLINICAL DATA: Pulmonary nodules, retroperitoneal adenopathy.

[Series 2: rtn ap without · axial · non-contrast · 0.80mm/px · z∈[-198,-13]mm · 13 of 45 slices shown, 19 images]
[im 4/45  soft-tissue]
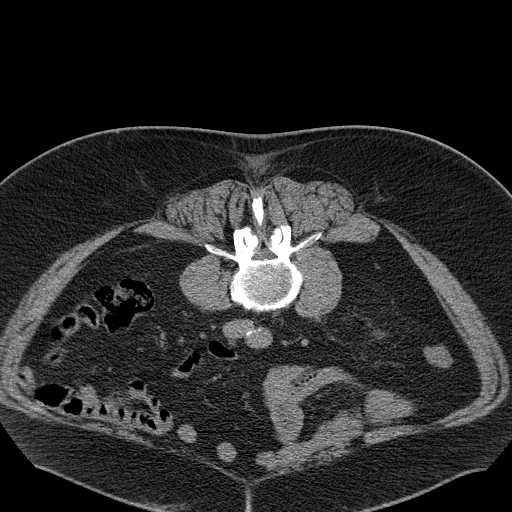
[im 4/45  bone]
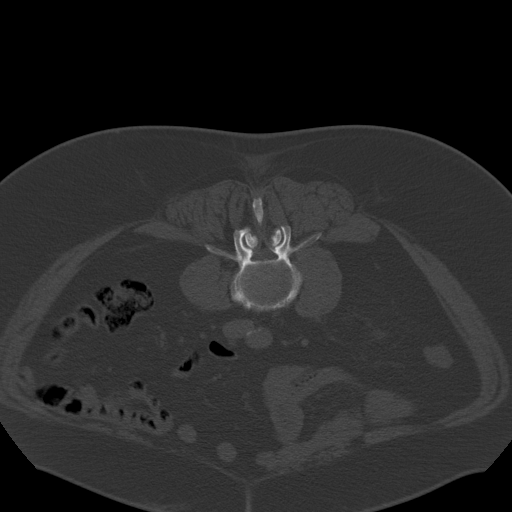
[im 7/45  soft-tissue]
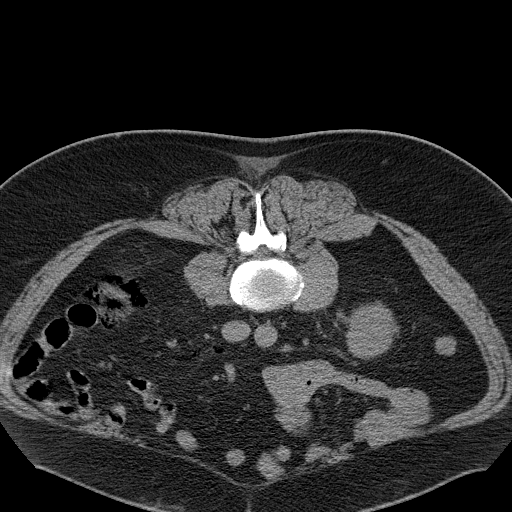
[im 10/45  soft-tissue]
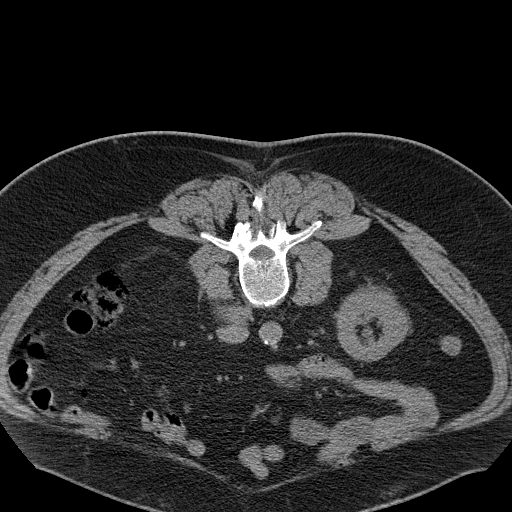
[im 13/45  soft-tissue]
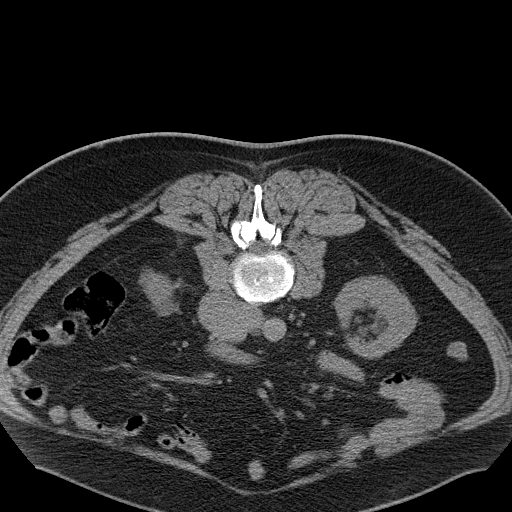
[im 16/45  soft-tissue]
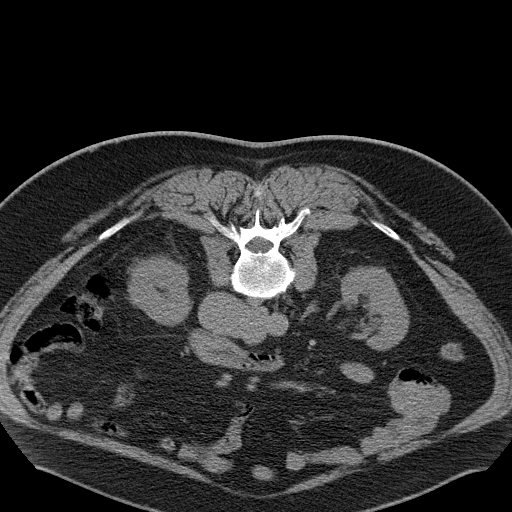
[im 19/45  soft-tissue]
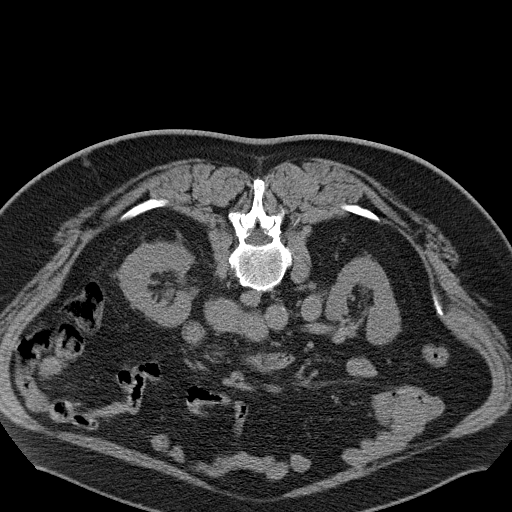
[im 23/45  soft-tissue]
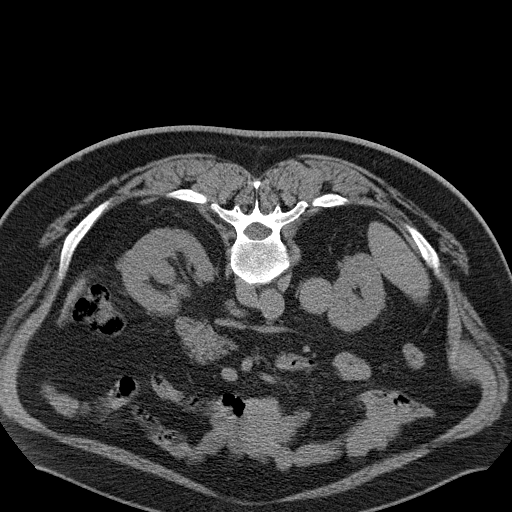
[im 26/45  soft-tissue]
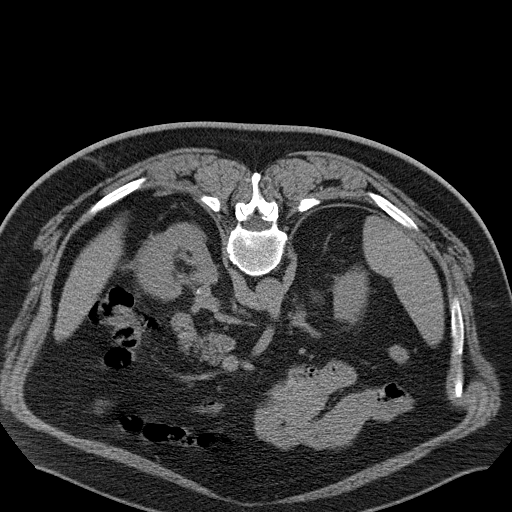
[im 29/45  soft-tissue]
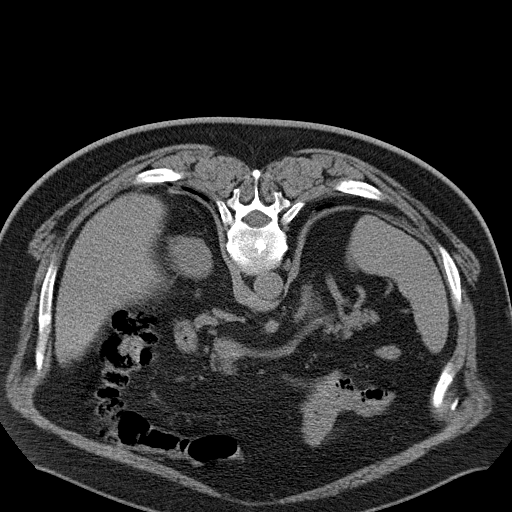
[im 29/45  bone]
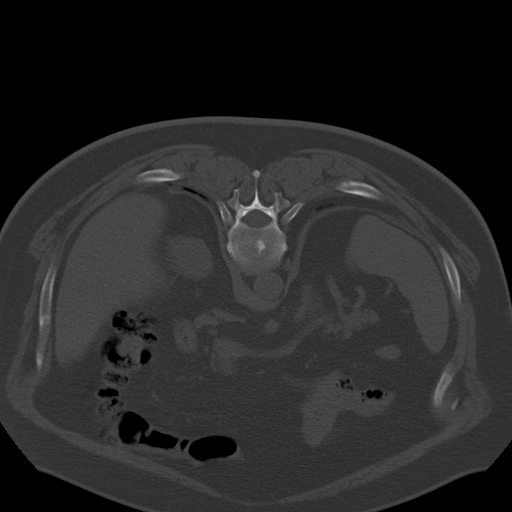
[im 32/45  soft-tissue]
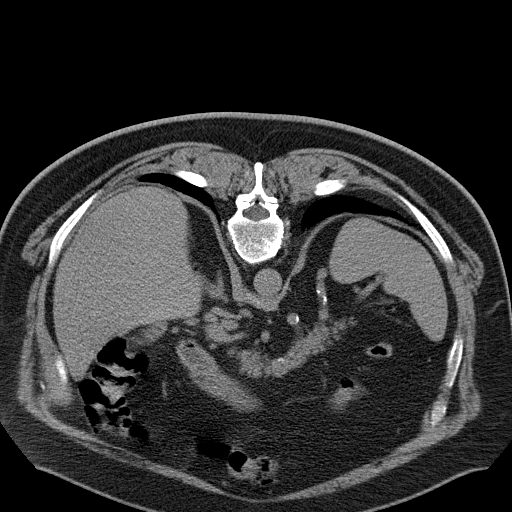
[im 32/45  lung]
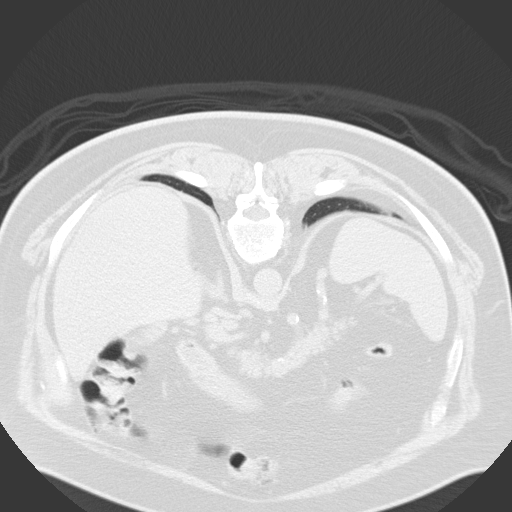
[im 35/45  soft-tissue]
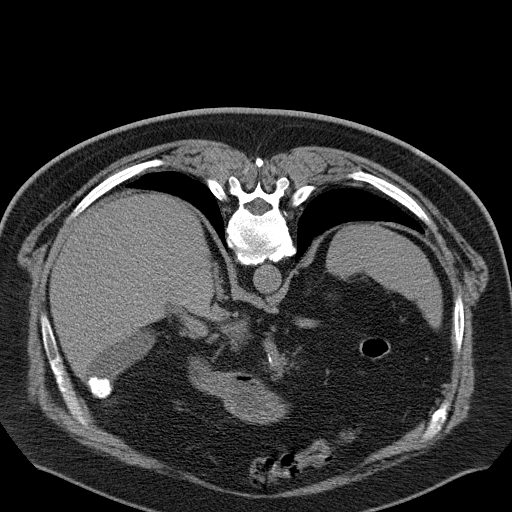
[im 35/45  lung]
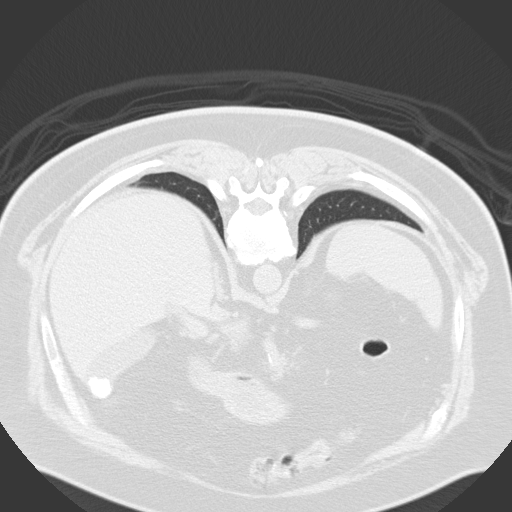
[im 38/45  soft-tissue]
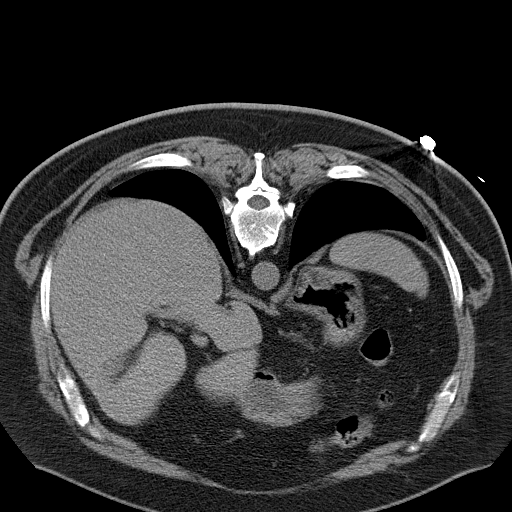
[im 38/45  lung]
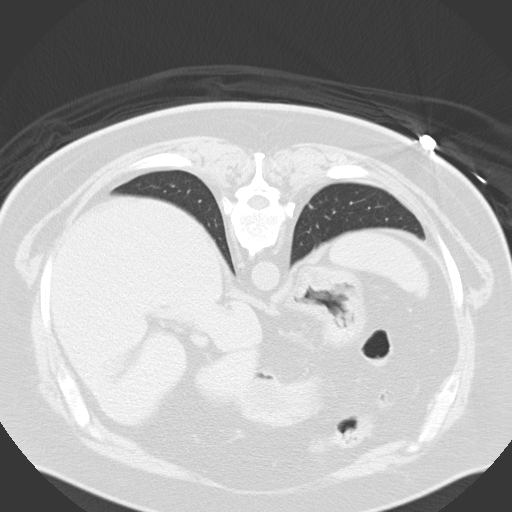
[im 41/45  soft-tissue]
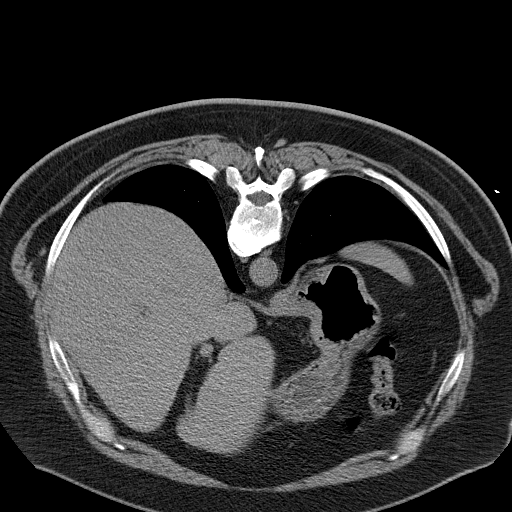
[im 41/45  lung]
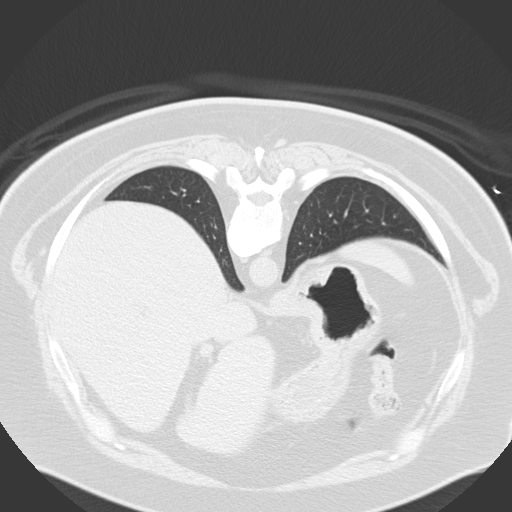

[13 of 32 positions shown; findings below may reference images not displayed]

CT-GUIDED RETROPERITONEAL CORE AND ASPIRATION BIOPSY

Technique and findings:  The patient was placed prone on the CT
gantry and limited axial scans through the mid abdomen were
obtained.  An appropriate skin entry site was localized.  Site was
marked, prepped with Betadine, draped in usual sterile fashion,
infiltrated locally with 1% lidocaine. Intravenous Fentanyl and
Versed were administered as conscious sedation during continuous
cardiorespiratory monitoring by the radiology RN, with a total
moderate sedation time of 10 minutes.
Under CT fluoroscopic guidance, a 17 gauge trocar needle was
advanced to the margin of the right retroperitoneal adenopathy.
Once needle tip position was confirmed, coaxial 18-gauge core
biopsy samples were obtained.  An aspiration sample was obtained
through the guide needle itself, which was then removed.
Postprocedure scans show no hematoma or other apparent
complication. The patient tolerated the procedure well.

IMPRESSION
1.  Technically successful CT-guided core and aspiration
retroperitoneal biopsy

## 2012-01-09 ENCOUNTER — Telehealth: Payer: Self-pay | Admitting: Internal Medicine

## 2012-01-09 ENCOUNTER — Ambulatory Visit (INDEPENDENT_AMBULATORY_CARE_PROVIDER_SITE_OTHER)
Admission: RE | Admit: 2012-01-09 | Discharge: 2012-01-09 | Disposition: A | Discharge status: Home / Self Care | Payer: Medicare Other | Source: Ambulatory Visit | Attending: Internal Medicine | Admitting: Internal Medicine

## 2012-01-09 ENCOUNTER — Encounter: Payer: Self-pay | Admitting: Internal Medicine

## 2012-01-09 ENCOUNTER — Ambulatory Visit (INDEPENDENT_AMBULATORY_CARE_PROVIDER_SITE_OTHER): Payer: Medicare Other | Admitting: Internal Medicine

## 2012-01-09 ENCOUNTER — Other Ambulatory Visit (INDEPENDENT_AMBULATORY_CARE_PROVIDER_SITE_OTHER): Payer: Medicare Other

## 2012-01-09 VITALS — BP 128/82 | HR 94 | Ht 71.0 in | Wt 330.8 lb

## 2012-01-09 DIAGNOSIS — R0600 Dyspnea, unspecified: Secondary | ICD-10-CM

## 2012-01-09 DIAGNOSIS — I4891 Unspecified atrial fibrillation: Secondary | ICD-10-CM

## 2012-01-09 DIAGNOSIS — J449 Chronic obstructive pulmonary disease, unspecified: Secondary | ICD-10-CM

## 2012-01-09 DIAGNOSIS — C8589 Other specified types of non-Hodgkin lymphoma, extranodal and solid organ sites: Secondary | ICD-10-CM

## 2012-01-09 DIAGNOSIS — J441 Chronic obstructive pulmonary disease with (acute) exacerbation: Secondary | ICD-10-CM

## 2012-01-09 DIAGNOSIS — R0609 Other forms of dyspnea: Secondary | ICD-10-CM

## 2012-01-09 DIAGNOSIS — J189 Pneumonia, unspecified organism: Secondary | ICD-10-CM

## 2012-01-09 DIAGNOSIS — Z23 Encounter for immunization: Secondary | ICD-10-CM

## 2012-01-09 DIAGNOSIS — J4489 Other specified chronic obstructive pulmonary disease: Secondary | ICD-10-CM

## 2012-01-09 LAB — BASIC METABOLIC PANEL
Chloride: 99 mEq/L (ref 96–112)
Creatinine, Ser: 0.9 mg/dL (ref 0.4–1.5)
Potassium: 4.5 mEq/L (ref 3.5–5.1)
Sodium: 137 mEq/L (ref 135–145)

## 2012-01-09 LAB — CBC WITH DIFFERENTIAL/PLATELET
Basophils Absolute: 0 10*3/uL (ref 0.0–0.1)
Eosinophils Absolute: 0 10*3/uL (ref 0.0–0.7)
Lymphocytes Relative: 16 % (ref 12.0–46.0)
MCHC: 32.6 g/dL (ref 30.0–36.0)
Neutro Abs: 7.1 10*3/uL (ref 1.4–7.7)
Neutrophils Relative %: 77.5 % — ABNORMAL HIGH (ref 43.0–77.0)
Platelets: 156 10*3/uL (ref 150.0–400.0)
RDW: 14 % (ref 11.5–14.6)

## 2012-01-09 LAB — BRAIN NATRIURETIC PEPTIDE: Pro B Natriuretic peptide (BNP): 186 pg/mL — ABNORMAL HIGH (ref 0.0–100.0)

## 2012-01-09 MED ORDER — FUROSEMIDE 20 MG PO TABS: ORAL_TABLET | ORAL | Status: DC

## 2012-01-09 MED ORDER — LEVALBUTEROL HCL 0.63 MG/3ML IN NEBU: 1.0000 | INHALATION_SOLUTION | Freq: Once | RESPIRATORY_TRACT | Status: DC

## 2012-01-09 MED ORDER — LEVALBUTEROL HCL 0.63 MG/3ML IN NEBU
0.6300 mg | INHALATION_SOLUTION | Freq: Once | RESPIRATORY_TRACT | Status: AC
  Administered 2012-01-09: 0.63 mg via RESPIRATORY_TRACT

## 2012-01-09 MED ORDER — PREDNISONE 10 MG PO TABS: ORAL_TABLET | ORAL | Status: DC

## 2012-01-09 MED ORDER — METHYLPREDNISOLONE ACETATE 80 MG/ML IJ SUSP
80.0000 mg | Freq: Once | INTRAMUSCULAR | Status: AC
  Administered 2012-01-09: 80 mg via INTRAMUSCULAR

## 2012-01-09 NOTE — Telephone Encounter (Signed)
Per [facility]Y instructions:  Patient Instructions     Order- DME [facility]?) Home O2 continuous and portable for resting O2 sat 80% room air, 3 L/M Dx COPD  Order- CXR Acute exacerbation of COPD  Lab- BNat Peptide, D-dimer, CBC, BMET Acute exac COPD, dyspnea  Flu vax  Depo 80  Neb xop 0.63  Script for prednisone taper sent  Script for lasix/ furosemide diuretic to take one daily x 3 days for fl   RX for xopenex neb was sent to the pharmacy so this was not charged correctly. Will forward to Lauren so she can charge/fix this since she gave pt the neb tx. Please advise Lauren thanks

## 2012-01-09 NOTE — Patient Instructions (Addendum)
Order- DME ( [facility]?)  Home O2 continuous and portable for resting O2 sat 80% room air, 3 L/M  Dx COPD  Order- CXR   Acute exacerbation of COPD              Lab- BNat Peptide, D-dimer, CBC, BMET     Acute exac COPD, dyspnea               Flu vax  Depo 80   Neb xop 0.63  Script for prednisone taper sent  Script for lasix/ furosemide diuretic to take one daily x 3 days for fluid

## 2012-01-09 NOTE — Telephone Encounter (Signed)
I spoke with spouse and Justin Ruiz is scheduled to come in today at 10 am to see dr. Maple Hudson

## 2012-01-09 NOTE — Progress Notes (Signed)
12/02/11- 60 yoM former smoker followed for COPD, OSA, complicated by hx lymphoma, AFib/ coumadin PFT 11/23/06- 0.47.   Hx 35 pk yr cigs.  LOV- 06/28/10  Patient states doing "pretty good". c/o sob with exertion. denies chest pain, chest tightness, wheezing, and cough. DOE especially on stairs- no change. Uses neb 1-2x/ yr. Seasonal pollens bother some.  OSA status not addressed this visit. COPD Assessment Test( CAT) 16/24  01/09/12-  61 yoM former smoker followed for COPD, OSA, complicated by hx lymphoma, AFib/ coumadin ACUTE VISIT: unable to lay flat-not able to breathe; Increased SOB and wheezing-getting worse; breathing tx at 4am and used rescue inhaler as well; has been going on since yesterday. Wife here On arrival today o2 sat 80, responded to 3 L O2. COPD assessment test (CAT) 40/40. Did well at [facility] on vacation without recognized fluid retention or other acute event. Home over the past week- Gradually worse shortness of breath x2 days with increased cough, watery rhinorrhea. Rattling chest congestion x2 days. Denies fever, pain, green, blood, sore throat. Wife gave antihistamine yesterday. CT chest 11/01/11-reviewed with them IMPRESSION:  1. New airspace opacity in the left lower lobe - pneumonia or  pulmonary hemorrhage could give a similar appearance. Follow-up  chest radiography to ensure clearance is recommended.  2. Several small right lung nodules are stable and nonspecific due  to small size.  3. Mediastinal lymph nodes are slightly increased in size compared  the prior exam, but currently within normal size limits.  4. Atherosclerosis.   ROS-see HPI Constitutional:   No-   weight loss, night sweats, fevers, chills, fatigue, lassitude. HEENT:   No-  headaches, difficulty swallowing, tooth/dental problems, sore throat,       +seasonal sneezing, itching, ear ache, nasal congestion, post nasal drip,  CV:  No-   chest pain, orthopnea, PND, swelling in lower extremities,  anasarca, dizziness, palpitations Resp: +  shortness of breath with exertion or at rest.              No-   productive cough,  + non-productive cough,  No- coughing up of blood.              No-   change in color of mucus.  No- wheezing.   Skin: No-   rash or lesions. GI:  No-   heartburn, indigestion, abdominal pain, nausea, vomiting, GU:  MS:  No-   joint pain or swelling.  Neuro-     nothing unusual Psych:  No- change in mood or affect. No depression or anxiety.  No memory loss.  OBJ- Physical Exam General- Alert, Oriented, Affect-appropriate, Distress- none acute, significant obesity Skin- rash-none, lesions- none, excoriation- none Lymphadenopathy- none Head- atraumatic            Eyes- Gross vision intact, PERRLA, conjunctivae and secretions clear            Ears- Hearing, canals-normal            Nose- Clear, no-Septal dev, mucus, polyps, erosion, perforation             Throat- Mallampati II , mucosa clear , drainage- none, tonsils- atrophic Neck- flexible , trachea midline, no stridor , thyroid nl, carotid no bruit Chest - symmetrical excursion , unlabored           Heart/CV- RRR , no murmur , no gallop  , no rub, nl s1 s2                           -  JVD- none , edema 1+, stasis changes- none, varices- none           Lung- + bilateral rhonchi, wheeze- none, cough- none , dullness-none, rub- none           Chest wall-  Abd-  Br/ Gen/ Rectal- Not done, not indicated Extrem- cyanosis- none, clubbing, none, atrophy- none, strength- nl. Neg Homan's Neuro- grossly intact to observation

## 2012-01-09 NOTE — Telephone Encounter (Signed)
I realized I sent to pharmacy at time it was done.  Patient was correctly charged for during visit nebulizer treatment. Thanks

## 2012-01-11 NOTE — Progress Notes (Signed)
Quick Note:  Pt aware of results. ______ 

## 2012-01-13 ENCOUNTER — Telehealth: Payer: Self-pay | Admitting: Internal Medicine

## 2012-01-13 NOTE — Telephone Encounter (Signed)
The best choice is delsym but he is on ace inhibitors that are likely going to make it difficult to control the cough no matter what - I'd be happy to see him or he could see Tammy NP  to sort this out but not able to do any additional phone advice or narcotic containing cough syrups at this point

## 2012-01-13 NOTE — Telephone Encounter (Signed)
Called, spoke with pt's wife.  She is aware MW recs pt take delsym for now and advised ace inhibitors could be contributing to cough.  Offered OV to sort this out.  Wife would like to pick up delsym to see if this helps first and will call back if no relief or cough worsens.  Nothing further needed at this time.  Will sign off and route to Dr. Maple Hudson as Lorain Childes.

## 2012-01-13 NOTE — Telephone Encounter (Signed)
I spoke with spouse and she stated pt has a hacking cough and occasionally gets up brown phlem. He was in and saw Dr. Maple Hudson on Monday for an OV. He was placed on prednisone. She is requesting to have some cough syrup called in for pt to help calm him down. Since Dr. Maple Hudson is off will forward to doc of the day for recs. Please advise Dr. Sherene Sires thanks  Allergies  Allergen Reactions  . Avapro (Irbesartan)   . Lipitor (Atorvastatin Calcium) Other (See Comments)    Leg pain

## 2012-01-16 ENCOUNTER — Telehealth: Payer: Self-pay | Admitting: Internal Medicine

## 2012-01-16 MED ORDER — PROMETHAZINE-CODEINE 6.25-10 MG/5ML PO SYRP: 5.0000 mL | ORAL_SOLUTION | Freq: Four times a day (QID) | ORAL | Status: DC | PRN

## 2012-01-16 NOTE — Telephone Encounter (Signed)
Per CDY offer promethazine with codeine cough syrup # 200 ml 1 tsp every 6 hours as needed for cough no refills Rx was called to pharm.  Spoke with spouse and notified that this was done

## 2012-01-16 NOTE — Telephone Encounter (Signed)
Returned patients spouse's phone call.  Wife reports patient has been taking the delsym since Friday as recommended by our Doc of the Day but has not had any improvement with cough symptoms. Reports productive cough with thick yellow mucus.  Last dose of desylm was last night.  Last office visit was given prednisone taper and wife says he fas a dose tonight and in the morning and then he will be finished with the prednisone.  Would like recommendations on something to relieve this cough, is alsoquestioning does the patient have an infection.  Allergies  Allergen Reactions  . Avapro (Irbesartan)   . Lipitor (Atorvastatin Calcium) Other (See Comments)    Leg pain

## 2012-01-17 NOTE — Assessment & Plan Note (Signed)
Suggested on chest CT in July 2013

## 2012-01-17 NOTE — Assessment & Plan Note (Addendum)
Acute exacerbation of COPD, probably reflecting component of obesity with hypoventilation. Plan-look for heart failure with B. natruretic peptide, look for  VTE with d-dimer, CBC, BMET, chest x-ray  Flu vaccine Depo-Medrol prednisone taper Lasix for 3 days. Home oxygen based on oxygen saturation 80% on arrival today.

## 2012-01-17 NOTE — Assessment & Plan Note (Signed)
He reports this is "controlled" at this time, followed by Dr. Cyndie Chime

## 2012-01-17 NOTE — Assessment & Plan Note (Signed)
Pulse feels nearly regular to consider this may be a cardiac status change, perhaps fluid overload.

## 2012-02-20 ENCOUNTER — Ambulatory Visit: Payer: Medicare Other | Admitting: Internal Medicine

## 2012-02-24 ENCOUNTER — Ambulatory Visit (HOSPITAL_COMMUNITY)
Admission: RE | Admit: 2012-02-24 | Discharge: 2012-02-24 | Disposition: A | Discharge status: Home / Self Care | Payer: Medicare Other | Source: Ambulatory Visit | Attending: Oncology | Admitting: Oncology

## 2012-02-24 ENCOUNTER — Other Ambulatory Visit (HOSPITAL_BASED_OUTPATIENT_CLINIC_OR_DEPARTMENT_OTHER): Payer: Medicare Other

## 2012-02-24 ENCOUNTER — Other Ambulatory Visit: Payer: Self-pay | Admitting: Oncology

## 2012-02-24 DIAGNOSIS — R918 Other nonspecific abnormal finding of lung field: Secondary | ICD-10-CM

## 2012-02-24 DIAGNOSIS — C8510 Unspecified B-cell lymphoma, unspecified site: Secondary | ICD-10-CM

## 2012-02-24 DIAGNOSIS — C8589 Other specified types of non-Hodgkin lymphoma, extranodal and solid organ sites: Secondary | ICD-10-CM

## 2012-02-24 LAB — CBC WITH DIFFERENTIAL/PLATELET
Basophils Absolute: 0 10*3/uL (ref 0.0–0.1)
Eosinophils Absolute: 0.1 10*3/uL (ref 0.0–0.5)
HCT: 44.6 % (ref 38.4–49.9)
HGB: 15.1 g/dL (ref 13.0–17.1)
MCH: 31 pg (ref 27.2–33.4)
MONO#: 0.5 10*3/uL (ref 0.1–0.9)
NEUT#: 3.8 10*3/uL (ref 1.5–6.5)
NEUT%: 61.7 % (ref 39.0–75.0)
RDW: 14.1 % (ref 11.0–14.6)
WBC: 6.1 10*3/uL (ref 4.0–10.3)
lymph#: 1.7 10*3/uL (ref 0.9–3.3)

## 2012-02-24 LAB — COMPREHENSIVE METABOLIC PANEL (CC13)
ALT: 14 U/L (ref 0–55)
Albumin: 3.5 g/dL (ref 3.5–5.0)
CO2: 31 mEq/L — ABNORMAL HIGH (ref 22–29)
Chloride: 100 mEq/L (ref 98–107)
Glucose: 208 mg/dl — ABNORMAL HIGH (ref 70–99)
Potassium: 4.7 mEq/L (ref 3.5–5.1)
Sodium: 137 mEq/L (ref 136–145)
Total Protein: 7 g/dL (ref 6.4–8.3)

## 2012-02-24 LAB — LACTATE DEHYDROGENASE (CC13): LDH: 165 U/L (ref 125–220)

## 2012-02-24 MED ORDER — IOHEXOL 300 MG/ML  SOLN
100.0000 mL | Freq: Once | INTRAMUSCULAR | Status: AC | PRN
  Administered 2012-02-24: 100 mL via INTRAVENOUS

## 2012-02-27 ENCOUNTER — Other Ambulatory Visit: Payer: Self-pay | Admitting: Cardiology

## 2012-02-27 NOTE — Telephone Encounter (Signed)
..   Requested Prescriptions   Pending Prescriptions Disp Refills  . metoprolol (LOPRESSOR) 100 MG tablet [Pharmacy Med Name: METOPROLOL TART 100MG  TAB] 60 tablet 1    Sig: TAKE ONE TABLET BY MOUTH TWICE DAILY  ..Patient needs to contact office to schedule  Appointment  for future refills.[phone]. Thank you.

## 2012-02-29 ENCOUNTER — Telehealth: Payer: Self-pay | Admitting: *Deleted

## 2012-02-29 NOTE — Telephone Encounter (Signed)
Message copied by Sabino Snipes on Wed Feb 29, 2012  2:58 PM ------      Message from: Levert Feinstein      Created: Mon Feb 27, 2012  6:54 PM       Call pt CT negative for lymphoma recurrence

## 2012-02-29 NOTE — Telephone Encounter (Signed)
Pt notified per Dr Cyndie Chime of CT results.  Pt. pleased.

## 2012-03-05 ENCOUNTER — Ambulatory Visit (HOSPITAL_BASED_OUTPATIENT_CLINIC_OR_DEPARTMENT_OTHER): Payer: Medicare Other | Admitting: Oncology

## 2012-03-05 ENCOUNTER — Telehealth: Payer: Self-pay | Admitting: Oncology

## 2012-03-05 VITALS — BP 114/79 | HR 90 | Temp 99.1°F | Resp 22 | Ht 71.0 in | Wt 324.3 lb

## 2012-03-05 DIAGNOSIS — C8589 Other specified types of non-Hodgkin lymphoma, extranodal and solid organ sites: Secondary | ICD-10-CM

## 2012-03-05 NOTE — Telephone Encounter (Signed)
Gave pt appt for lab and MD , gave pt oral contrast for CT on May 2014

## 2012-03-06 NOTE — Progress Notes (Signed)
Hematology and Oncology Follow Up Visit  Justin Ruiz [id number] 1950/10/16 61 y.o. 03/06/2012 8:30 AM   Principle Diagnosis: Encounter Diagnosis  Name Primary?  Marland Kitchen LYMPHOMA Yes     Interim History:   Follow-up visit for this pleasant 61 year old man diagnosed with high-grade B-cell non-Hodgkin's lymphoma in September of 2011, when he was found to have retroperitoneal lymphadenopathy on a CT scan of the chest done for evaluation of some small pulmonary nodules. 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 I elected to treat him with a more aggressive regimen than the standard CHOP, and he received Rituxan with infusional CHOPE. He did achieve a complete response by PET scan criteria with that scan done following completion of treatment on 06/09/2010. He still has some minimal residual adenopathy on CT scan which did not take up the PET tracer. This has continued to shrink over time, down from initial measurements of 5 x 3 cm to measurement on the scan done on 02/24/12  of 2.7 x 1.3 cm which has now  been stable on serial scans. A left lower lobe infiltrate seen on a 11/01/11 study which correlated with a clinical pneumonia, has resolved. There are no new areas of concern. He has asymptomatic cholelithiasis.   Medications: reviewed  Allergies:  Allergies  Allergen Reactions  . Avapro (Irbesartan)   . Lipitor (Atorvastatin Calcium) Other (See Comments)    Leg pain    Review of Systems: Constitutional:   No constitutional symptoms Respiratory: No cough or dyspnea Cardiovascular:  No chest pain or palpitations Gastrointestinal: No abdominal pain or change in bowel habit Genito-Urinary: No urinary tract symptoms Musculoskeletal: Chronic arthritis pain low back Neurologic: No headache or change in vision Skin: No rash or  ecchymosis Remaining ROS negative.  Physical Exam: Blood pressure 114/79, pulse 90, temperature 99.1 F (37.3 C), temperature source Oral, resp. rate 22, height 5\' 11"  (1.803 m), weight 324 lb 4.8 oz (147.102 kg). Wt Readings from Last 3 Encounters:  03/05/12 324 lb 4.8 oz (147.102 kg)  01/09/12 330 lb 12.8 oz (150.05 kg)  12/02/11 327 lb 9.6 oz (148.598 kg)     General appearance: Obese Caucasian man HENNT: Erythema or exudate Lymph nodes: No cervical, supraclavicular, axillary, or inguinal lymphadenopathy Breasts: Lungs: Clear to auscultation resonant to percussion Heart: Regular rhythm no murmur Abdomen: Soft, nontender, no mass, no organomegaly Extremities: No edema, no calf tenderness Vascular: No cyanosis Neurologic: Motor strength 5 over 5, reflexes 1+ symmetric, sensation intact to vibration over the fingertips by tuning fork exam Skin: No rash or ecchymosis  Lab Results: Lab Results  Component Value Date   WBC 6.1 02/24/2012   HGB 15.1 02/24/2012   HCT 44.6 02/24/2012   MCV 91.9 02/24/2012   PLT 161 02/24/2012     Chemistry      Component Value Date/Time   NA 137 02/24/2012 0803   NA 137 01/09/2012 1059   NA 136 11/01/2011 0753   K 4.7 02/24/2012 0803   K 4.5 01/09/2012 1059   K 4.8* 11/01/2011 0753   CL 100 02/24/2012 0803   CL 99 01/09/2012 1059   CL 91* 11/01/2011 0753   CO2 31* 02/24/2012 0803   CO2 29 01/09/2012 1059   CO2 34* 11/01/2011 0753   BUN 17.0 02/24/2012 0803   BUN 22 01/09/2012 1059   BUN 18 11/01/2011 0753   CREATININE 0.8  02/24/2012 0803   CREATININE 0.9 01/09/2012 1059   CREATININE 0.9 11/01/2011 0753      Component Value Date/Time   CALCIUM 9.6 02/24/2012 0803   CALCIUM 9.1 01/09/2012 1059   CALCIUM 8.8 11/01/2011 0753   ALKPHOS 60 02/24/2012 0803   ALKPHOS 57 11/01/2011 0753   ALKPHOS 68 07/16/2010 1315   ALKPHOS 68 07/16/2010 1315   AST 14 02/24/2012 0803   AST 14 11/01/2011 0753   AST 21 07/16/2010 1315   AST 21 07/16/2010 1315   ALT 14 02/24/2012 0803   ALT 22  07/16/2010 1315   ALT 22 07/16/2010 1315   BILITOT 1.42* 02/24/2012 0803   BILITOT 1.50 11/01/2011 0753   BILITOT 1.3* 07/16/2010 1315   BILITOT 1.3* 07/16/2010 1315       Radiological Studies: Ct Chest W Contrast  02/24/2012  *RADIOLOGY REPORT*  Clinical Data:  History of lymphoma.  Followup atypical left lower lobe infiltrate.  CT CHEST, ABDOMEN AND PELVIS WITH CONTRAST  Technique:  Multidetector CT imaging of the chest, abdomen and pelvis was performed following the standard protocol during bolus administration of intravenous contrast.  Contrast: OMNIPAQUE IOHEXOL 300 MG/ML  SOLN  Comparison:  11/01/2011.  CT CHEST  Findings:  The chest wall is unremarkable.  No supraclavicular or axillary mass or adenopathy.  The thyroid gland is normal.  The bony thorax is intact.  No destructive bone lesions or spinal canal compromise.  Stable degenerative changes involving the thoracic spine with moderate spurring.  Stable degenerative change at the sternoclavicular joints.  The heart is normal in size.  No pericardial effusion.  No mediastinal or hilar lymphadenopathy.  Small scattered lymph nodes are unchanged.  No new nodes.  Stable minimal coronary artery calcifications.  The esophagus is grossly normal.  Examination of the lung parenchyma demonstrates interval resolution of the left lower lobe infiltrate or inflammatory process.  Tiny scattered airspace nodules, interstitial thickening and mild peribronchial thickening likely due to mild chronic inflammatory change in the lingula and right lower lobe.  Mild stable emphysematous changes.  No worrisome pulmonary nodule or mass.  No pleural effusion.  IMPRESSION:  1.  No CT findings to suggest recurrent lymphoma involving the chest. 2.  Resolution of left lower lobe infiltrate. 3.  Patchy areas of tree in bud appearance likely due to chronic inflammatory changes.  No worrisome pulmonary mass or nodule.  CT ABDOMEN AND PELVIS  Findings:  The liver is unremarkable  and stable.  No focal hepatic lesions or intrahepatic ductal dilatation.  Stable cholelithiasis. No common bile duct dilatation.  The pancreas is normal and stable. A small duodenal diverticulum is noted near the pancreatic head. The spleen is normal.  The adrenal glands and kidneys are normal.  The stomach, duodenum, small bowel and colon are unremarkable.  No inflammatory changes or mass lesions.  Stable small scattered mesenteric lymph nodes but no mesenteric adenopathy.  There is a stable 27 x 13 mm retroperitoneal lymph node on image number 79 between the aorta and IVC.  The no new abdominal or pelvic lymph nodes.  The bladder, prostate gland and seminal vesicles are unremarkable. There are a few small scattered pelvic lymph nodes which are stable.  No inguinal lymph nodes.  The bony structures are intact.  Stable degenerative changes involving the spine and pelvis.  IMPRESSION:  1.  Stable scattered small mesenteric lymph nodes and stable 27 x 13 mm retroperitoneal lymph node. 2.  Cholelithiasis.   Original Report Authenticated By:  Rudie Meyer, M.D.    Ct Abdomen Pelvis W Contrast  02/24/2012  *RADIOLOGY REPORT*  Clinical Data:  History of lymphoma.  Followup atypical left lower lobe infiltrate.  CT CHEST, ABDOMEN AND PELVIS WITH CONTRAST  Technique:  Multidetector CT imaging of the chest, abdomen and pelvis was performed following the standard protocol during bolus administration of intravenous contrast.  Contrast: OMNIPAQUE IOHEXOL 300 MG/ML  SOLN  Comparison:  11/01/2011.  CT CHEST  Findings:  The chest wall is unremarkable.  No supraclavicular or axillary mass or adenopathy.  The thyroid gland is normal.  The bony thorax is intact.  No destructive bone lesions or spinal canal compromise.  Stable degenerative changes involving the thoracic spine with moderate spurring.  Stable degenerative change at the sternoclavicular joints.  The heart is normal in size.  No pericardial effusion.  No  mediastinal or hilar lymphadenopathy.  Small scattered lymph nodes are unchanged.  No new nodes.  Stable minimal coronary artery calcifications.  The esophagus is grossly normal.  Examination of the lung parenchyma demonstrates interval resolution of the left lower lobe infiltrate or inflammatory process.  Tiny scattered airspace nodules, interstitial thickening and mild peribronchial thickening likely due to mild chronic inflammatory change in the lingula and right lower lobe.  Mild stable emphysematous changes.  No worrisome pulmonary nodule or mass.  No pleural effusion.  IMPRESSION:  1.  No CT findings to suggest recurrent lymphoma involving the chest. 2.  Resolution of left lower lobe infiltrate. 3.  Patchy areas of tree in bud appearance likely due to chronic inflammatory changes.  No worrisome pulmonary mass or nodule.  CT ABDOMEN AND PELVIS  Findings:  The liver is unremarkable and stable.  No focal hepatic lesions or intrahepatic ductal dilatation.  Stable cholelithiasis. No common bile duct dilatation.  The pancreas is normal and stable. A small duodenal diverticulum is noted near the pancreatic head. The spleen is normal.  The adrenal glands and kidneys are normal.  The stomach, duodenum, small bowel and colon are unremarkable.  No inflammatory changes or mass lesions.  Stable small scattered mesenteric lymph nodes but no mesenteric adenopathy.  There is a stable 27 x 13 mm retroperitoneal lymph node on image number 79 between the aorta and IVC.  The no new abdominal or pelvic lymph nodes.  The bladder, prostate gland and seminal vesicles are unremarkable. There are a few small scattered pelvic lymph nodes which are stable.  No inguinal lymph nodes.  The bony structures are intact.  Stable degenerative changes involving the spine and pelvis.  IMPRESSION:  1.  Stable scattered small mesenteric lymph nodes and stable 27 x 13 mm retroperitoneal lymph node. 2.  Cholelithiasis.   Original Report Authenticated  By: Rudie Meyer, M.D.     Impression and Plan: #1. 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 He remains in a clinical and radiographic remission now out over  2 years from diagnosis in September of 2011.  I will decrease CT scans every 6 months for the third year, annual times year 4 and 5 then on a when necessary basis only.   #2. Pulmonary granulomas. Stable on serial radiographs.  #3. Insulin-dependent diabetes.  #4. Obesity sleep apnea syndrome.  #5. Herpes zoster infection right sacral dermatome may 2012  #6. Obstructive airway disease  #7. Coronary artery disease  #8. Hypertension.  #9. Hyperlipidemia  #10.  pulmonary infiltrate left lower lobe. Resolved .    CC:. Dr. Rudi Heap  Justin Feinstein, MD 11/12/20138:30 AM

## 2012-03-21 ENCOUNTER — Encounter: Payer: Self-pay | Admitting: Cardiology

## 2012-03-21 ENCOUNTER — Ambulatory Visit (INDEPENDENT_AMBULATORY_CARE_PROVIDER_SITE_OTHER): Payer: Medicare Other | Admitting: Cardiology

## 2012-03-21 VITALS — BP 146/77 | HR 87 | Ht 71.0 in | Wt 332.0 lb

## 2012-03-21 DIAGNOSIS — I4891 Unspecified atrial fibrillation: Secondary | ICD-10-CM

## 2012-03-21 NOTE — Progress Notes (Signed)
HPI Patient presents for routine followup. Since I last saw him he has had no new cardiovascular complaints. The patient denies any new symptoms such as chest discomfort, neck or arm discomfort. Despite the fact he is in fibrillation today he does not notice this. There has been no new shortness of breath, PND or orthopnea. There have been no reported palpitations, presyncope or syncope.    He did have a bout of pneumonia earlier this year. There was a workup with a BNP that he was not felt to be heart failure.  Allergies  Allergen Reactions  . Avapro (Irbesartan)   . Lipitor (Atorvastatin Calcium) Other (See Comments)    Leg pain    Current Outpatient Prescriptions  Medication Sig Dispense Refill  . albuterol (PROAIR HFA) 108 (90 BASE) MCG/ACT inhaler Inhale 2 puffs into the lungs every 6 (six) hours as needed for wheezing or shortness of breath.  1 Inhaler  prn  . ALPRAZolam (XANAX) 0.25 MG tablet Take 0.25 mg by mouth at bedtime as needed.        Marland Kitchen aspirin 81 MG EC tablet Take 81 mg by mouth daily.        . Cholecalciferol (VITAMIN D) 1000 UNITS capsule Take 5,000 Units by mouth daily.      . enalapril (VASOTEC) 10 MG tablet Take 10 mg by mouth daily.       . insulin lispro protamine-insulin lispro (HUMALOG 50/50) (50-50) 100 UNIT/ML SUSP Inject 60 Units into the skin 2 (two) times daily before a meal.      . ipratropium-albuterol (DUONEB) 0.5-2.5 (3) MG/3ML SOLN Take 3 mLs by nebulization every 6 (six) hours as needed.  75 mL  prn  . metFORMIN (GLUCOPHAGE) 500 MG tablet Take 500 mg by mouth 3 (three) times daily with meals.        . metoprolol (LOPRESSOR) 100 MG tablet TAKE ONE TABLET BY MOUTH TWICE DAILY  60 tablet  1  . promethazine-codeine (PHENERGAN WITH CODEINE) 6.25-10 MG/5ML syrup Take 5 mLs by mouth every 6 (six) hours as needed for cough.  200 mL  0  . simvastatin (ZOCOR) 80 MG tablet Take 40 mg by mouth at bedtime.       Marland Kitchen warfarin (COUMADIN) 5 MG tablet Take 5 mg by mouth  daily. As directed      . [DISCONTINUED] digoxin (LANOXIN) 0.25 MG tablet Take 250 mcg by mouth daily.         Past Medical History  Diagnosis Date  . Cancer   . Diabetes mellitus   . NHL (non-Hodgkin's lymphoma)     nhl dx 9/11  . Asthma   . Hyperlipidemia   . Obesity     exogenous  . Impotence   . Prostate cancer   . Wheezing   . Left knee pain   . BPH (benign prostatic hypertrophy)   . Colon polyps   . Diabetes mellitus  type 2   . COPD (chronic obstructive pulmonary disease)   . B-cell lymphoma   . A-fib   . Shingles   . AR (allergic rhinitis)   . GERD (gastroesophageal reflux disease)   . Hx of colonic polyps   . Arthritis   . Asthma   . HTN (hypertension)     x 3 years  . Pneumonia     Past Surgical History  Procedure Date  . Appendectomy     ROS:  As stated in the HPI and negative for all other systems.  PHYSICAL  EXAM BP 146/77  Pulse 87  Ht 5\' 11"  (1.803 m)  Wt 332 lb (150.594 kg)  BMI 46.30 kg/m2 GENERAL:  Well appearing HEENT:  Pupils equal round and reactive, fundi not visualized, oral mucosa unremarkable NECK:  No jugular venous distention, waveform within normal limits, carotid upstroke brisk and symmetric, no bruits, no thyromegaly LUNGS:  Clear to auscultation bilaterally BACK:  No CVA tenderness CHEST:  Unremarkable HEART:  PMI not displaced or sustained,S1 and S2 within normal limits, no S3, no S4, no clicks, no rubs, no murmurs ABD:  Flat, positive bowel sounds normal in frequency in pitch, no bruits, no rebound, no guarding, no midline pulsatile mass, no hepatomegaly, no splenomegaly, obese EXT:  2 plus pulses throughout, no edema, no cyanosis no clubbing   EKG:  Atrial fibrillation with occasional premature ventricular contractions, borderline low voltage in limb leads, nonspecific diffuse T wave flattening 03/21/2012   ASSESSMENT AND PLAN  FIBRILLATION, ATRIAL -  He has no symptomatic paroxysms. He tolerates anticoagulation. He  could not afford to switch to a new agent.  He will stop the aspirin.  Essential hypertension, benign -  His blood pressure is elevated today but he hasn't been on recent readings. No change in therapy is planned.  OBESITY, UNSPECIFIED -  We again discussed strategies for weight loss.

## 2012-03-21 NOTE — Patient Instructions (Addendum)
Please stop ASA. Continue all other medications as listed  Follow up in 1 year with Dr Antoine Poche.  You will receive a letter in the mail 2 months before you are due.  Please call us when you receive this letter to schedule your follow up appointment.

## 2012-03-29 ENCOUNTER — Encounter: Payer: Self-pay | Admitting: Cardiology

## 2012-03-29 LAB — BASIC METABOLIC PANEL
Glucose: 185 mg/dL
Potassium: 4.5 mmol/L (ref 3.4–5.3)
Sodium: 142 mmol/L (ref 137–147)

## 2012-03-29 LAB — LIPID PANEL: LDL Cholesterol: 68 mg/dL

## 2012-04-02 LAB — FECAL OCCULT BLOOD, GUAIAC: Fecal Occult Blood: NEGATIVE

## 2012-04-26 ENCOUNTER — Other Ambulatory Visit: Payer: Self-pay | Admitting: Cardiology

## 2012-05-29 ENCOUNTER — Other Ambulatory Visit: Payer: Self-pay | Admitting: Cardiology

## 2012-05-30 ENCOUNTER — Other Ambulatory Visit: Payer: Self-pay

## 2012-06-30 ENCOUNTER — Encounter: Payer: Self-pay | Admitting: *Deleted

## 2012-07-09 ENCOUNTER — Other Ambulatory Visit: Payer: Self-pay | Admitting: Family Medicine

## 2012-07-10 ENCOUNTER — Encounter: Payer: Self-pay | Admitting: Pharmacist

## 2012-07-10 LAB — URINE CULTURE: Colony Count: 15000

## 2012-07-10 NOTE — Progress Notes (Signed)
This encounter was created in error - please disregard.

## 2012-07-21 ENCOUNTER — Other Ambulatory Visit: Payer: Self-pay | Admitting: Family Medicine

## 2012-07-25 ENCOUNTER — Telehealth: Payer: Self-pay | Admitting: Pharmacist

## 2012-07-25 ENCOUNTER — Telehealth: Payer: Self-pay | Admitting: Family Medicine

## 2012-07-25 DIAGNOSIS — C8589 Other specified types of non-Hodgkin lymphoma, extranodal and solid organ sites: Secondary | ICD-10-CM

## 2012-07-25 MED ORDER — INSULIN LISPRO PROT & LISPRO (50-50 MIX) 100 UNIT/ML ~~LOC~~ SUSP: 70.0000 [IU] | Freq: Two times a day (BID) | SUBCUTANEOUS | Status: DC

## 2012-07-26 MED ORDER — INSULIN LISPRO PROT & LISPRO (75-25 MIX) 100 UNIT/ML ~~LOC~~ SUSP: SUBCUTANEOUS | Status: DC

## 2012-07-26 NOTE — Telephone Encounter (Signed)
Left #30 ml of Humalog mix 75/25 samples in refrig in lab.  Patient notified - instructed to take humalog mix 75/25 the same as 50/50 mix.

## 2012-07-30 ENCOUNTER — Other Ambulatory Visit: Payer: Self-pay | Admitting: Cardiology

## 2012-08-20 ENCOUNTER — Ambulatory Visit (INDEPENDENT_AMBULATORY_CARE_PROVIDER_SITE_OTHER): Payer: Medicare Other | Admitting: Pharmacist

## 2012-08-20 DIAGNOSIS — I4891 Unspecified atrial fibrillation: Secondary | ICD-10-CM | POA: Insufficient documentation

## 2012-08-20 DIAGNOSIS — Z7901 Long term (current) use of anticoagulants: Secondary | ICD-10-CM | POA: Insufficient documentation

## 2012-08-20 HISTORY — DX: Long term (current) use of anticoagulants: Z79.01

## 2012-08-27 ENCOUNTER — Other Ambulatory Visit: Payer: Self-pay | Admitting: Cardiology

## 2012-08-29 NOTE — Telephone Encounter (Deleted)
Was this done?

## 2012-08-29 NOTE — Telephone Encounter (Signed)
Done by Elvin So

## 2012-09-03 ENCOUNTER — Encounter (HOSPITAL_COMMUNITY): Payer: Self-pay

## 2012-09-03 ENCOUNTER — Ambulatory Visit (HOSPITAL_COMMUNITY)
Admission: RE | Admit: 2012-09-03 | Discharge: 2012-09-03 | Disposition: A | Discharge status: Home / Self Care | Payer: Medicare Other | Source: Ambulatory Visit | Attending: Oncology | Admitting: Oncology

## 2012-09-03 ENCOUNTER — Other Ambulatory Visit (HOSPITAL_BASED_OUTPATIENT_CLINIC_OR_DEPARTMENT_OTHER): Payer: Medicare Other | Admitting: Lab

## 2012-09-03 DIAGNOSIS — N289 Disorder of kidney and ureter, unspecified: Secondary | ICD-10-CM | POA: Insufficient documentation

## 2012-09-03 DIAGNOSIS — K4020 Bilateral inguinal hernia, without obstruction or gangrene, not specified as recurrent: Secondary | ICD-10-CM | POA: Insufficient documentation

## 2012-09-03 DIAGNOSIS — C8583 Other specified types of non-Hodgkin lymphoma, intra-abdominal lymph nodes: Secondary | ICD-10-CM

## 2012-09-03 DIAGNOSIS — K8689 Other specified diseases of pancreas: Secondary | ICD-10-CM | POA: Insufficient documentation

## 2012-09-03 DIAGNOSIS — K5710 Diverticulosis of small intestine without perforation or abscess without bleeding: Secondary | ICD-10-CM | POA: Insufficient documentation

## 2012-09-03 DIAGNOSIS — M533 Sacrococcygeal disorders, not elsewhere classified: Secondary | ICD-10-CM | POA: Insufficient documentation

## 2012-09-03 DIAGNOSIS — C8589 Other specified types of non-Hodgkin lymphoma, extranodal and solid organ sites: Secondary | ICD-10-CM

## 2012-09-03 DIAGNOSIS — R599 Enlarged lymph nodes, unspecified: Secondary | ICD-10-CM | POA: Insufficient documentation

## 2012-09-03 DIAGNOSIS — K8020 Calculus of gallbladder without cholecystitis without obstruction: Secondary | ICD-10-CM | POA: Insufficient documentation

## 2012-09-03 DIAGNOSIS — M5137 Other intervertebral disc degeneration, lumbosacral region: Secondary | ICD-10-CM | POA: Insufficient documentation

## 2012-09-03 DIAGNOSIS — M51379 Other intervertebral disc degeneration, lumbosacral region without mention of lumbar back pain or lower extremity pain: Secondary | ICD-10-CM | POA: Insufficient documentation

## 2012-09-03 DIAGNOSIS — Z9221 Personal history of antineoplastic chemotherapy: Secondary | ICD-10-CM | POA: Insufficient documentation

## 2012-09-03 LAB — CBC & DIFF AND RETIC
BASO%: 0.3 % (ref 0.0–2.0)
Basophils Absolute: 0 10e3/uL (ref 0.0–0.1)
EOS%: 2 % (ref 0.0–7.0)
Eosinophils Absolute: 0.1 10e3/uL (ref 0.0–0.5)
HCT: 44.8 % (ref 38.4–49.9)
HGB: 14.9 g/dL (ref 13.0–17.1)
Immature Retic Fract: 15.5 % — ABNORMAL HIGH (ref 3.00–10.60)
LYMPH%: 30.1 % (ref 14.0–49.0)
MCH: 30.8 pg (ref 27.2–33.4)
MCHC: 33.3 g/dL (ref 32.0–36.0)
MCV: 92.8 fL (ref 79.3–98.0)
MONO#: 0.4 10e3/uL (ref 0.1–0.9)
MONO%: 7.1 % (ref 0.0–14.0)
NEUT#: 3.7 10e3/uL (ref 1.5–6.5)
NEUT%: 60.5 % (ref 39.0–75.0)
Platelets: 154 10e3/uL (ref 140–400)
RBC: 4.83 10e6/uL (ref 4.20–5.82)
RDW: 13.2 % (ref 11.0–14.6)
Retic %: 2.5 % — ABNORMAL HIGH (ref 0.80–1.80)
Retic Ct Abs: 120.75 10e3/uL — ABNORMAL HIGH (ref 34.80–93.90)
WBC: 6.1 10e3/uL (ref 4.0–10.3)
lymph#: 1.8 10e3/uL (ref 0.9–3.3)

## 2012-09-03 LAB — COMPREHENSIVE METABOLIC PANEL (CC13)
ALT: 14 U/L (ref 0–55)
AST: 15 U/L (ref 5–34)
Albumin: 3.3 g/dL — ABNORMAL LOW (ref 3.5–5.0)
Alkaline Phosphatase: 60 U/L (ref 40–150)
BUN: 20.5 mg/dL (ref 7.0–26.0)
CO2: 29 meq/L (ref 22–29)
Calcium: 9.5 mg/dL (ref 8.4–10.4)
Chloride: 100 meq/L (ref 98–107)
Creatinine: 0.9 mg/dL (ref 0.7–1.3)
Glucose: 202 mg/dL — ABNORMAL HIGH (ref 70–99)
Potassium: 4.6 meq/L (ref 3.5–5.1)
Sodium: 139 meq/L (ref 136–145)
Total Bilirubin: 1.03 mg/dL (ref 0.20–1.20)
Total Protein: 6.8 g/dL (ref 6.4–8.3)

## 2012-09-03 LAB — LACTATE DEHYDROGENASE (CC13): LDH: 157 U/L (ref 125–245)

## 2012-09-03 MED ORDER — IOHEXOL 300 MG/ML  SOLN
125.0000 mL | Freq: Once | INTRAMUSCULAR | Status: AC | PRN
  Administered 2012-09-03: 125 mL via INTRAVENOUS

## 2012-09-04 ENCOUNTER — Telehealth: Payer: Self-pay | Admitting: *Deleted

## 2012-09-04 NOTE — Telephone Encounter (Signed)
Spoke with patient.  Let him know CT scan is stable- no evidence for recurrent lymphoma..  He appreciated the call.

## 2012-09-04 NOTE — Telephone Encounter (Signed)
Message copied by Orbie Hurst on Tue Sep 04, 2012  3:24 PM ------      Message from: Levert Feinstein      Created: Tue Sep 04, 2012 11:59 AM       Call pt CT scan stable - no evidence for recurrent lymphoma ------

## 2012-09-10 ENCOUNTER — Telehealth: Payer: Self-pay | Admitting: Oncology

## 2012-09-10 ENCOUNTER — Ambulatory Visit (HOSPITAL_BASED_OUTPATIENT_CLINIC_OR_DEPARTMENT_OTHER): Payer: Medicare Other | Admitting: Oncology

## 2012-09-10 VITALS — BP 125/85 | HR 74 | Temp 97.6°F | Resp 20 | Ht 70.5 in | Wt 327.9 lb

## 2012-09-10 DIAGNOSIS — E119 Type 2 diabetes mellitus without complications: Secondary | ICD-10-CM

## 2012-09-10 DIAGNOSIS — C8589 Other specified types of non-Hodgkin lymphoma, extranodal and solid organ sites: Secondary | ICD-10-CM

## 2012-09-10 DIAGNOSIS — Z7901 Long term (current) use of anticoagulants: Secondary | ICD-10-CM

## 2012-09-10 NOTE — Telephone Encounter (Signed)
gv and printed appt sched and avs to pt...gv pt Barium

## 2012-09-13 NOTE — Progress Notes (Signed)
Hematology and Oncology Follow Up Visit  Justin Ruiz [id number] [birth year] 62 y.o. 09/13/2012 6:46 PM   Principle Diagnosis: Encounter Diagnoses  Name Primary?  Marland Kitchen LYMPHOMA Yes  . LYMPHOMA   . Chronic anticoagulation      Interim History:   Follow-up visit for this pleasant 62 year old man diagnosed with high-grade B-cell non-Hodgkin's lymphoma in September of 2011, when he was found to have retroperitoneal lymphadenopathy on a CT scan of the chest done for evaluation of some small pulmonary nodules. 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 I elected to treat him with a more aggressive regimen than the standard CHOP, and he received Rituxan with infusional CHOPE. He did achieve a complete response by PET scan criteria with that scan done following completion of treatment on 06/09/2010. He still has some minimal residual adenopathy on CT scan which did not take up the PET tracer. This has continued to shrink over time, down from initial measurements of 5 x 3 cm to measurement of 2.5 x 1.4 cm on the scan done on 09/03/12. This does not been stable on serial scans. No new disease.  Other than significant fatigue he has no new symptoms. He is morbidly obese, diabetic, has obstructive sleep apnea.   Medications: reviewed  Allergies:  Allergies  Allergen Reactions  . Avapro (Irbesartan)   . Lipitor (Atorvastatin Calcium) Other (See Comments)    Leg pain    Review of Systems: Constitutional:   See above Respiratory: No cough or dyspnea at rest Cardiovascular:  No ischemic type chest pain or palpitations Gastrointestinal: No abdominal pain or change in bowel habit Genito-Urinary: No urinary tract symptoms Musculoskeletal: No muscle bone or joint pain Neurologic: No headache or change in vision Skin: No rash or  ecchymosis Remaining ROS negative.  Physical Exam: Blood pressure 125/85, pulse 74, temperature 97.6 F (36.4 C), temperature source Oral, resp. rate 20, height 5' 10.5" (1.791 m), weight 327 lb 14.4 oz (148.734 kg). Wt Readings from Last 3 Encounters:  09/10/12 327 lb 14.4 oz (148.734 kg)  03/28/12 318 lb (144.244 kg)  03/21/12 332 lb (150.594 kg)     General appearance: Obese Caucasian man HENNT: Pharynx no erythema or exudate Lymph nodes: No cervical, supraclavicular, or axillary adenopathy Breasts: Lungs: Clear to auscultation, resonant to percussion Heart: Regular rhythm, no murmur Abdomen: Soft, obese, nontender, no obvious mass or organomegaly Extremities: Trace edema, venous stasis changes Musculoskeletal: GU: Vascular: No cyanosis Neurologic: Motor strength 5 over 5 reflexes 1+ symmetric Skin: No rash or ecchymosis  Lab Results: Lab Results  Component Value Date   WBC 6.1 09/03/2012   HGB 14.9 09/03/2012   HCT 44.8 09/03/2012   MCV 92.8 09/03/2012   PLT 154 09/03/2012     Chemistry      Component Value Date/Time   NA 139 09/03/2012 0751   NA 142 03/29/2012   NA 137 01/09/2012 1059   NA 136 11/01/2011 0753   K 4.6 09/03/2012 0751   K 4.5 03/29/2012   K 4.8* 11/01/2011 0753   CL 100 09/03/2012 0751   CL 99 01/09/2012 1059   CL 91* 11/01/2011 0753   CO2 29 09/03/2012 0751   CO2 29 01/09/2012 1059   CO2 34* 11/01/2011 0753   BUN 20.5 09/03/2012 0751   BUN 20 03/29/2012   BUN 22 01/09/2012 1059   BUN 18 11/01/2011 0753  CREATININE 0.9 09/03/2012 0751   CREATININE 0.8 03/29/2012   CREATININE 0.9 01/09/2012 1059   CREATININE 0.9 11/01/2011 0753   GLU 185 03/29/2012      Component Value Date/Time   CALCIUM 9.5 09/03/2012 0751   CALCIUM 9.1 01/09/2012 1059   CALCIUM 8.8 11/01/2011 0753   ALKPHOS 60 09/03/2012 0751   ALKPHOS 57 11/01/2011 0753   ALKPHOS 68 07/16/2010 1315   AST 15 09/03/2012 0751   AST 14 11/01/2011 0753   AST 21 07/16/2010 1315   ALT 14 09/03/2012 0751   ALT 22 07/16/2010  1315   BILITOT 1.03 09/03/2012 0751   BILITOT 1.50 11/01/2011 0753   BILITOT 1.3* 07/16/2010 1315     LDH 157  Radiological Studies: I personally reviewed these images Dg Chest 2 View  09/03/2012   *RADIOLOGY REPORT*  Clinical Data: 62 year old male with history of lymphoma.  Status post treatment.  CHEST - 2 VIEW  Comparison: 02/24/2012 and earlier.  Findings: Stable lung volumes. Visualized tracheal air column is within normal limits.   Stable cardiac size and mediastinal contours.  Mediastinal lipomatosis.  Chronic blunting of the left lateral costophrenic angle without evidence of effusion.  Elsewhere lung parenchyma within normal limits. No acute osseous abnormality identified.  IMPRESSION: No acute or metastatic cardiopulmonary abnormality.   Original Report Authenticated By: Erskine Speed, M.D.   Ct Abdomen Pelvis W Contrast  09/03/2012   *RADIOLOGY REPORT*  Clinical Data: Follow up of treated lymphoma.  Non-Hodgkins lymphoma.  Chemotherapy completed 1/12.  No current complaints.  CT ABDOMEN AND PELVIS WITH CONTRAST  Technique:  Multidetector CT imaging of the abdomen and pelvis was performed following the standard protocol during bolus administration of intravenous contrast.  Contrast: OMNIPAQUE IOHEXOL 300 MG/ML  SOLN  Comparison: 02/24/2012  Findings: Lung bases:  Patchy bibasilar scarring.  The previously described micro nodularity at the right lung base is not readily apparent. Normal heart size without pericardial or pleural effusion.  Coronary artery atherosclerosis.  Abdomen/pelvis:  Mild hepatic steatosis suspected. No focal liver lesion.  Normal spleen, stomach.  Descending duodenal diverticulum. Mild pancreatic atrophy.  Cholelithiasis.  No biliary ductal dilatation.  Normal adrenal glands and right kidney.  Similar size of a nodule which is positioned just medial to the left kidney and measures 1.1 cm.  A retrocaval a 2.5 x 1.4 cm lymph node appears similar to the 2.7 x 1.3 cm on the  prior exam.  No new adenopathy.  Normal colon and terminal ileum.  Normal small bowel without abdominal ascites.  Right common iliac node which measures 8 mm on image 66/series 2 and is unchanged.  Bilateral fat containing inguinal hernias.  Normal urinary bladder and prostate.  No significant free fluid.  Bones/Musculoskeletal:  Degenerative partial fusion of the right sacroiliac joint.  Degenerative disc disease, most apparent at the lumbosacral junction.  IMPRESSION:  1.  Similar isolated retrocaval adenopathy. 2.  Scattered prominent but not pathologically enlarged nodes, including within the left perirenal space and right common iliac station. 3.  No evidence of new or progressive disease. 4.  Cholelithiasis.   Original Report Authenticated By: Jeronimo Greaves, M.D.    Impression: #1. 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 He remains in a clinical and radiographic remission now out almost 3 years from diagnosis in September of 2011.  I will decrease CT scans every 6 months for the third year, annual times year 4 and 5 then on a when  necessary basis only.   #2. Pulmonary granulomas. Stable on serial radiographs.  #3. Insulin-dependent diabetes.  #4. Obesity sleep apnea syndrome.  #5. Herpes zoster infection right sacral dermatome may 2012  #6. Obstructive airway disease  #7. Coronary artery disease  #8. Hypertension.  #9. Hyperlipidemia      CC:.    Levert Feinstein, MD 5/22/20146:46 PM

## 2012-10-01 ENCOUNTER — Ambulatory Visit (INDEPENDENT_AMBULATORY_CARE_PROVIDER_SITE_OTHER): Payer: Medicare Other | Admitting: Pharmacist

## 2012-10-01 DIAGNOSIS — I4891 Unspecified atrial fibrillation: Secondary | ICD-10-CM

## 2012-10-01 DIAGNOSIS — Z7901 Long term (current) use of anticoagulants: Secondary | ICD-10-CM

## 2012-10-01 LAB — POCT INR: INR: 3

## 2012-10-01 NOTE — Patient Instructions (Signed)
Anticoagulation Dose Instructions as of 10/01/2012     Justin Ruiz Tue Wed Thu Fri Sat   New Dose 5 mg 7.5 mg 5 mg 5 mg 5 mg 7.5 mg 5 mg    Description       continue 7.5mg  mondays and fridays and 5mg  all other days.       INR was 3.0 today

## 2012-10-30 ENCOUNTER — Other Ambulatory Visit: Payer: Self-pay | Admitting: *Deleted

## 2012-10-30 NOTE — Telephone Encounter (Signed)
[facility] at [facility], they have 75/25.  Would you be ok with changing to 75/25 (Novolog or Humalog)?  If so, needs changed to that.  He is in the donut hole and needs some assistance.  Could we fax Rx to [phone]?

## 2012-10-31 ENCOUNTER — Telehealth: Payer: Self-pay | Admitting: Pharmacist

## 2012-10-31 MED ORDER — INSULIN LISPRO PROT & LISPRO (75-25 MIX) 100 UNIT/ML KWIKPEN: 62.0000 [IU] | PEN_INJECTOR | Freq: Two times a day (BID) | SUBCUTANEOUS | Status: DC

## 2012-10-31 NOTE — Telephone Encounter (Signed)
[facility] called to see if they could dispense Humalog 75/25 vials instead of kwikpen because this is all they have - this is OK.  Also patient doesn't qualify to receive assistance from drug company.   We had #46ml of Humalog 75/25 - left in lab refrig for pt to p/u

## 2012-10-31 NOTE — Telephone Encounter (Signed)
Request sent to me yesterday at 3:40 pm but was out of office until today.  I have taken care of this issue this morning - please see previous phone message.

## 2012-10-31 NOTE — Telephone Encounter (Signed)
Changed from Humalog mix 50/50/ to humalog Mix 75/25 since patient assistance program is unable to get the 50/50 mix.  Rx printed and faxed to Blima Singer at [phone]

## 2012-11-13 ENCOUNTER — Other Ambulatory Visit: Payer: Self-pay | Admitting: Family Medicine

## 2012-11-14 ENCOUNTER — Ambulatory Visit (INDEPENDENT_AMBULATORY_CARE_PROVIDER_SITE_OTHER): Payer: Medicare Other | Admitting: Family Medicine

## 2012-11-14 ENCOUNTER — Ambulatory Visit (INDEPENDENT_AMBULATORY_CARE_PROVIDER_SITE_OTHER): Payer: Medicare Other | Admitting: Pharmacist

## 2012-11-14 ENCOUNTER — Encounter: Payer: Self-pay | Admitting: Family Medicine

## 2012-11-14 VITALS — BP 127/82 | HR 89 | Temp 97.7°F | Ht 70.5 in | Wt 318.6 lb

## 2012-11-14 DIAGNOSIS — R5383 Other fatigue: Secondary | ICD-10-CM

## 2012-11-14 DIAGNOSIS — E785 Hyperlipidemia, unspecified: Secondary | ICD-10-CM

## 2012-11-14 DIAGNOSIS — Z7901 Long term (current) use of anticoagulants: Secondary | ICD-10-CM

## 2012-11-14 DIAGNOSIS — I4891 Unspecified atrial fibrillation: Secondary | ICD-10-CM

## 2012-11-14 DIAGNOSIS — E559 Vitamin D deficiency, unspecified: Secondary | ICD-10-CM

## 2012-11-14 DIAGNOSIS — N400 Enlarged prostate without lower urinary tract symptoms: Secondary | ICD-10-CM

## 2012-11-14 DIAGNOSIS — J309 Allergic rhinitis, unspecified: Secondary | ICD-10-CM

## 2012-11-14 DIAGNOSIS — E119 Type 2 diabetes mellitus without complications: Secondary | ICD-10-CM

## 2012-11-14 DIAGNOSIS — I10 Essential (primary) hypertension: Secondary | ICD-10-CM

## 2012-11-14 DIAGNOSIS — E669 Obesity, unspecified: Secondary | ICD-10-CM

## 2012-11-14 DIAGNOSIS — Z87898 Personal history of other specified conditions: Secondary | ICD-10-CM

## 2012-11-14 DIAGNOSIS — J449 Chronic obstructive pulmonary disease, unspecified: Secondary | ICD-10-CM

## 2012-11-14 LAB — POCT CBC
Granulocyte percent: 65.3 %G (ref 37–80)
HCT, POC: 47.5 % (ref 43.5–53.7)
Lymph, poc: 2.2 (ref 0.6–3.4)
MCH, POC: 30.1 pg (ref 27–31.2)
MCV: 91.2 fL (ref 80–97)
Platelet Count, POC: 160 10*3/uL (ref 142–424)
RBC: 5.2 M/uL (ref 4.69–6.13)

## 2012-11-14 LAB — POCT INR: INR: 2.7

## 2012-11-14 NOTE — Patient Instructions (Signed)
Anticoagulation Dose Instructions as of 11/14/2012     Glynis Smiles Tue Wed Thu Fri Sat   New Dose 5 mg 7.5 mg 5 mg 5 mg 5 mg 7.5 mg 5 mg    Description       continue 7.5mg  mondays and fridays and 5mg  all other days.       INR was 2.7 today

## 2012-11-14 NOTE — Progress Notes (Signed)
Subjective:    Patient ID: Justin Ruiz, male    DOB: 1950/05/13, 62 y.o.   MRN: [id number]  HPI Patient comes in today for followup management and coordination of chronic medical problems. These problems include diabetes mellitus type 1 controlled, hyperlipidemia, COPD, atrial fibrillation, and hypertension. He also has lymphoma and this is managed by his oncologist. He is also on high-risk medications for his atrial fib. Patient is concerned that his father had prostate cancer and he might have that issue to deal with also. We will do a prostate exam today because time is due for that.. B. allergy issues and respiratory situation for the patient is stable for him at this time of the year.   Review of Systems  Constitutional: Positive for fatigue (slight).  HENT: Positive for congestion (slight due to allergies) and postnasal drip (slight). Negative for ear pain.   Eyes: Positive for redness and itching (due to allergies). Negative for pain and discharge.  Respiratory: Positive for cough (occasional dry) and wheezing (slight).   Cardiovascular: Positive for leg swelling (bilateral, intermitent). Negative for chest pain.  Gastrointestinal: Positive for diarrhea (occasional). Negative for nausea and abdominal pain.  Genitourinary: Negative.   Musculoskeletal: Positive for back pain (LBP) and arthralgias (bilateral knees, hips, hands shoulders). Negative for myalgias.  Skin: Negative.   Allergic/Immunologic: Positive for environmental allergies (seasonal).  Neurological: Negative.   Psychiatric/Behavioral: Negative for sleep disturbance.       Objective:   Physical Exam BP 127/82  Pulse 89  Temp(Src) 97.7 F (36.5 C) (Oral)  Ht 5' 10.5" (1.791 m)  Wt 318 lb 9.6 oz (144.516 kg)  BMI 45.05 kg/m2  The patient appeared overweight and normally developed, alert and oriented to time and place. Speech, behavior and judgement appear normal. Vital signs as documented.  Head exam is  unremarkable. No scleral icterus or pallor noted. There is nasal congestion bilaterally. Ear canals and TMs are normal. Mouth and throat were normal with dentures in place. Neck is without jugular venous distension, thyromegally, or carotid bruits. Carotid upstrokes are brisk bilaterally. No cervical adenopathy. Lungs are clear anteriorly and posteriorly to auscultation, but with diminished breath sounds bilaterally. Normal respiratory effort. Cardiac exam reveals irregular irregular rate and rhythm at 84 per minute. First and second heart sounds normal. No murmurs, rubs or gallops.  Abdominal exam reveals obesity but normal bowl sounds, no masses, no organomegaly and no aortic enlargement. No inguinal adenopathy. Rectal exam revealed no masses. The prostate gland was barely palpable due to buttock size. The external genitalia were normal. There is no inguinal hernia. Extremities are nonedematous and both  pedal pulses are normal. Skin without pallor or jaundice.  Warm and dry, without rash. Neurologic exam reveals normal deep tendon reflexes and normal sensation. Diabetic foot exam was done.          Assessment & Plan:  1. Atrial fibrillation - BASIC METABOLIC PANEL WITH GFR; Standing - BASIC METABOLIC PANEL WITH GFR  2. Type 2 diabetes mellitus - POCT glycosylated hemoglobin (Hb A1C); Standing - POCT glycosylated hemoglobin (Hb A1C)  3. Hypertension  4. Hyperlipemia - NMR Lipoprofile with Lipids; Standing - Hepatic function panel; Standing - NMR Lipoprofile with Lipids - Hepatic function panel  5. COPD (chronic obstructive pulmonary disease)  6. ALLERGIC RHINITIS  7. DIABETES, TYPE 2  8. Essential hypertension, benign  9. HYPERLIPIDEMIA  10. Vitamin D deficiency - Vitamin D 25 hydroxy; Standing - Vitamin D 25 hydroxy  11. Fatigue - POCT CBC; Standing -  Thyroid Panel With TSH - POCT CBC  12. BPH (benign prostatic hypertrophy) - PSA  13. BENIGN PROSTATIC  HYPERTROPHY, HX OF  14. C O P D  15. Obesity, unspecified  Patient Instructions  Fall precautions discussed Continue current meds and therapeutic lifestyle changes Pt will schedule eye exam Check with insurance about the cost of the shingles shot   Nyra Capes MD

## 2012-11-14 NOTE — Patient Instructions (Addendum)
Fall precautions discussed Continue current meds and therapeutic lifestyle changes Pt will schedule eye exam Check with insurance about the cost of the shingles shot

## 2012-11-15 LAB — HEPATIC FUNCTION PANEL
ALT: 17 U/L (ref 0–53)
Bilirubin, Direct: 0.2 mg/dL (ref 0.0–0.3)
Indirect Bilirubin: 1.4 mg/dL — ABNORMAL HIGH (ref 0.0–0.9)
Total Bilirubin: 1.6 mg/dL — ABNORMAL HIGH (ref 0.3–1.2)

## 2012-11-15 LAB — THYROID PANEL WITH TSH
Free Thyroxine Index: 3.3 (ref 1.0–3.9)
T3 Uptake: 37 % (ref 22.5–37.0)
TSH: 1.373 u[IU]/mL (ref 0.350–4.500)

## 2012-11-15 LAB — NMR LIPOPROFILE WITH LIPIDS
Cholesterol, Total: 140 mg/dL (ref <200)
HDL Particle Number: 31.5 umol/L (ref >=30.5)
HDL-C: 43 mg/dL (ref >=40)
LDL (calc): 38 mg/dL (ref <100)
LDL Particle Number: 902 nmol/L (ref <1000)
LP-IR Score: 64 — ABNORMAL HIGH (ref <=45)
Triglycerides: 296 mg/dL — ABNORMAL HIGH (ref <150)
VLDL Size: 49.2 nm — ABNORMAL HIGH (ref <=46.6)

## 2012-11-15 LAB — BASIC METABOLIC PANEL WITH GFR
BUN: 13 mg/dL (ref 6–23)
CO2: 28 mEq/L (ref 19–32)
Chloride: 100 mEq/L (ref 96–112)
GFR, Est African American: >89 mL/min
Glucose, Bld: 158 mg/dL — ABNORMAL HIGH (ref 70–99)
Potassium: 4.5 mEq/L (ref 3.5–5.3)
Sodium: 141 mEq/L (ref 135–145)

## 2012-11-15 LAB — VITAMIN D 25 HYDROXY (VIT D DEFICIENCY, FRACTURES): Vit D, 25-Hydroxy: 60 ng/mL (ref 30–89)

## 2012-11-20 ENCOUNTER — Other Ambulatory Visit: Payer: Self-pay | Admitting: Family Medicine

## 2012-11-30 ENCOUNTER — Other Ambulatory Visit: Payer: Self-pay | Admitting: Family Medicine

## 2012-11-30 NOTE — Telephone Encounter (Signed)
Sent by accident, please disregard

## 2012-11-30 NOTE — Telephone Encounter (Signed)
Last seen 11/14/11

## 2012-12-07 ENCOUNTER — Ambulatory Visit: Payer: Medicare Other | Admitting: Internal Medicine

## 2012-12-17 ENCOUNTER — Ambulatory Visit (INDEPENDENT_AMBULATORY_CARE_PROVIDER_SITE_OTHER): Payer: Medicare Other | Admitting: Pharmacist

## 2012-12-17 DIAGNOSIS — Z7901 Long term (current) use of anticoagulants: Secondary | ICD-10-CM

## 2012-12-17 DIAGNOSIS — E785 Hyperlipidemia, unspecified: Secondary | ICD-10-CM

## 2012-12-17 DIAGNOSIS — I4891 Unspecified atrial fibrillation: Secondary | ICD-10-CM

## 2012-12-17 NOTE — Patient Instructions (Signed)
Anticoagulation Dose Instructions as of 12/17/2012     Glynis Smiles Tue Wed Thu Fri Sat   New Dose 5 mg 7.5 mg 5 mg 5 mg 5 mg 7.5 mg 5 mg    Description       continue 7.5mg  mondays and fridays and 5mg  all other days.       INR was 2.9 today

## 2012-12-21 LAB — HEPATIC FUNCTION PANEL (6)
AST: 15 IU/L (ref 0–40)
Albumin: 3.8 g/dL (ref 3.6–4.8)
Bilirubin, Direct: 0.19 mg/dL (ref 0.00–0.40)
Total Bilirubin: 0.8 mg/dL (ref 0.0–1.2)

## 2012-12-28 ENCOUNTER — Other Ambulatory Visit: Payer: Self-pay | Admitting: Family Medicine

## 2013-01-08 ENCOUNTER — Other Ambulatory Visit: Payer: Self-pay

## 2013-01-08 MED ORDER — ENALAPRIL MALEATE 20 MG PO TABS: 20.0000 mg | ORAL_TABLET | Freq: Every day | ORAL | Status: DC

## 2013-01-14 ENCOUNTER — Other Ambulatory Visit: Payer: Self-pay | Admitting: Family Medicine

## 2013-01-17 ENCOUNTER — Ambulatory Visit (INDEPENDENT_AMBULATORY_CARE_PROVIDER_SITE_OTHER): Payer: Medicare Other | Admitting: Pharmacist

## 2013-01-17 DIAGNOSIS — Z7901 Long term (current) use of anticoagulants: Secondary | ICD-10-CM

## 2013-01-17 DIAGNOSIS — I4891 Unspecified atrial fibrillation: Secondary | ICD-10-CM

## 2013-01-17 MED ORDER — METOPROLOL TARTRATE 100 MG PO TABS: 100.0000 mg | ORAL_TABLET | Freq: Two times a day (BID) | ORAL | Status: DC

## 2013-01-17 MED ORDER — SIMVASTATIN 80 MG PO TABS: 80.0000 mg | ORAL_TABLET | Freq: Every day | ORAL | Status: DC

## 2013-01-17 NOTE — Progress Notes (Signed)
Anticoagulation visit only

## 2013-01-17 NOTE — Patient Instructions (Signed)
Anticoagulation Dose Instructions as of 01/17/2013     Justin Ruiz Tue Wed Thu Fri Sat   New Dose 5 mg 7.5 mg 5 mg 5 mg 5 mg 7.5 mg 5 mg    Description       continue 7.5mg  mondays and fridays and 5mg  all other days.      INR was 2.7 today

## 2013-01-21 ENCOUNTER — Telehealth: Payer: Self-pay | Admitting: Pharmacist

## 2013-01-21 NOTE — Telephone Encounter (Signed)
Patient was called by [facility] and told that Rx for his BP was called in and patient was confused.  Rx was for lopressor / metoprolol which patient associates for his heart.  I explained that lopressor / metoprolol is for both heart and BP.

## 2013-01-22 ENCOUNTER — Ambulatory Visit (INDEPENDENT_AMBULATORY_CARE_PROVIDER_SITE_OTHER): Payer: Medicare Other | Admitting: Internal Medicine

## 2013-01-22 ENCOUNTER — Encounter: Payer: Self-pay | Admitting: Internal Medicine

## 2013-01-22 VITALS — BP 128/84 | HR 92 | Ht 71.0 in | Wt 326.8 lb

## 2013-01-22 DIAGNOSIS — J309 Allergic rhinitis, unspecified: Secondary | ICD-10-CM

## 2013-01-22 DIAGNOSIS — J302 Other seasonal allergic rhinitis: Secondary | ICD-10-CM

## 2013-01-22 DIAGNOSIS — J441 Chronic obstructive pulmonary disease with (acute) exacerbation: Secondary | ICD-10-CM

## 2013-01-22 MED ORDER — ALBUTEROL SULFATE HFA 108 (90 BASE) MCG/ACT IN AERS: 2.0000 | INHALATION_SPRAY | Freq: Four times a day (QID) | RESPIRATORY_TRACT | Status: DC | PRN

## 2013-01-22 MED ORDER — IPRATROPIUM-ALBUTEROL 0.5-2.5 (3) MG/3ML IN SOLN: 3.0000 mL | Freq: Four times a day (QID) | RESPIRATORY_TRACT | Status: DC | PRN

## 2013-01-22 NOTE — Progress Notes (Signed)
12/02/11- 60 yoM former smoker followed for COPD, OSA, complicated by hx lymphoma, AFib/ coumadin PFT 11/23/06- 0.47.   Hx 35 pk yr cigs.  LOV- 06/28/10  Patient states doing "pretty good". c/o sob with exertion. denies chest pain, chest tightness, wheezing, and cough. DOE especially on stairs- no change. Uses neb 1-2x/ yr. Seasonal pollens bother some.  OSA status not addressed this visit. COPD Assessment Test( CAT) 16/24  01/09/12-  61 yoM former smoker followed for COPD, OSA, complicated by hx lymphoma, AFib/ coumadin ACUTE VISIT: unable to lay flat-not able to breathe; Increased SOB and wheezing-getting worse; breathing tx at 4am and used rescue inhaler as well; has been going on since yesterday. Wife here On arrival today o2 sat 80, responded to 3 L O2. COPD assessment test (CAT) 40/40. Did well at [place] [employer] [facility] on vacation without recognized fluid retention or other acute event. Home over the past week- Gradually worse shortness of breath x2 days with increased cough, watery rhinorrhea. Rattling chest congestion x2 days. Denies fever, pain, green, blood, sore throat. Wife gave antihistamine yesterday. CT chest 11/01/11-reviewed with them IMPRESSION:  1. New airspace opacity in the left lower lobe - pneumonia or  pulmonary hemorrhage could give a similar appearance. Follow-up  chest radiography to ensure clearance is recommended.  2. Several small right lung nodules are stable and nonspecific due  to small size.  3. Mediastinal lymph nodes are slightly increased in size compared  the prior exam, but currently within normal size limits.  4. Atherosclerosis.   01/22/13- 61 yoM former smoker followed for COPD, OSA, complicated by hx lymphoma, AFib/ coumadin pt reports breathing is doing well-- denies any other concerns at this time Schedule for vaccine next week. Denies cough wheeze or active concern. Seasonal rhinitis with watery nose. Rescue inhaler sometimes  once a day, currently 2 or 3 times per  week. Denies any concerns about potential cough relation enalapril-discussed CXR 09/03/12 IMPRESSION:  No acute or metastatic cardiopulmonary abnormality.  Original Report Authenticated By: Erskine Speed, M.D.  ROS-see HPI Constitutional:   No-   weight loss, night sweats, fevers, chills, fatigue, lassitude. HEENT:   No-  headaches, difficulty swallowing, tooth/dental problems, sore throat,       +seasonal sneezing, itching, ear ache, nasal congestion, post nasal drip,  CV:  No-   chest pain, orthopnea, PND, swelling in lower extremities, anasarca, dizziness, palpitations Resp: +  shortness of breath with exertion or at rest.              No-   productive cough,  + non-productive cough,  No- coughing up of blood.              No-   change in color of mucus.  No- wheezing.   Skin: No-   rash or lesions. GI:  No-   heartburn, indigestion, abdominal pain, nausea, vomiting, GU:  MS:  No-   joint pain or swelling.  Neuro-     nothing unusual Psych:  No- change in mood or affect. No depression or anxiety.  No memory loss.  OBJ- Physical Exam General- Alert, Oriented, Affect-appropriate, Distress- none acute, significant obesity Skin- rash-none, lesions- none, excoriation- none Lymphadenopathy- none Head- atraumatic            Eyes- Gross vision intact, PERRLA, conjunctivae and secretions clear            Ears- Hearing, canals-normal            Nose- Clear, no-Septal  dev, mucus, polyps, erosion, perforation             Throat- Mallampati II , mucosa clear , drainage- none, tonsils- atrophic Neck- flexible , trachea midline, no stridor , thyroid nl, carotid no bruit Chest - symmetrical excursion , unlabored           Heart/CV- RRR , no murmur , no gallop  , no rub, nl s1 s2                           - JVD- none , edema-none, stasis changes- none, varices- none           Lung- clear, wheeze- none, cough- none , dullness-none, rub- none           Chest wall-  Abd-  Br/ Gen/ Rectal- Not done,  not indicated Extrem- cyanosis- none, clubbing, none, atrophy- none, strength- nl. Neg Homan's Neuro- grossly intact to observation

## 2013-01-22 NOTE — Patient Instructions (Signed)
Refills for rescue inhaler and nebulizer med sent to drug store  Don't forget to get that flu shot next week !  Please call as needed

## 2013-01-30 ENCOUNTER — Ambulatory Visit (INDEPENDENT_AMBULATORY_CARE_PROVIDER_SITE_OTHER): Payer: Medicare Other

## 2013-01-30 ENCOUNTER — Encounter (INDEPENDENT_AMBULATORY_CARE_PROVIDER_SITE_OTHER): Payer: Self-pay

## 2013-01-30 DIAGNOSIS — Z23 Encounter for immunization: Secondary | ICD-10-CM

## 2013-02-02 ENCOUNTER — Encounter: Payer: Self-pay | Admitting: Internal Medicine

## 2013-02-02 NOTE — Assessment & Plan Note (Signed)
Mild intermittent exacerbation. Medications are adequate

## 2013-02-02 NOTE — Assessment & Plan Note (Signed)
Antihistamines should be sufficient as needed basis.

## 2013-02-04 ENCOUNTER — Other Ambulatory Visit: Payer: Self-pay | Admitting: Family Medicine

## 2013-02-06 ENCOUNTER — Telehealth: Payer: Self-pay | Admitting: Family Medicine

## 2013-02-06 MED ORDER — INSULIN LISPRO PROT & LISPRO (50-50 MIX) 100 UNIT/ML KWIKPEN: 62.0000 [IU] | PEN_INJECTOR | Freq: Two times a day (BID) | SUBCUTANEOUS | Status: DC

## 2013-02-06 NOTE — Telephone Encounter (Signed)
i haven't received a refill request from [facility] for Justin Ruiz's insulin but we did receive Humalog Mix 50/50 insulin pen samples that I will leave at front desk for patient.   Note that patient uses Humalog MIx 50/50 when we have samples.

## 2013-02-07 ENCOUNTER — Encounter: Payer: Self-pay | Admitting: *Deleted

## 2013-02-08 ENCOUNTER — Other Ambulatory Visit: Payer: Self-pay | Admitting: Family Medicine

## 2013-02-09 ENCOUNTER — Other Ambulatory Visit: Payer: Self-pay | Admitting: *Deleted

## 2013-02-09 MED ORDER — INSULIN LISPRO PROT & LISPRO (50-50 MIX) 100 UNIT/ML KWIKPEN: 62.0000 [IU] | PEN_INJECTOR | Freq: Two times a day (BID) | SUBCUTANEOUS | Status: DC

## 2013-02-25 ENCOUNTER — Ambulatory Visit (INDEPENDENT_AMBULATORY_CARE_PROVIDER_SITE_OTHER): Payer: Medicare Other | Admitting: Pharmacist

## 2013-02-25 DIAGNOSIS — I4891 Unspecified atrial fibrillation: Secondary | ICD-10-CM

## 2013-02-25 DIAGNOSIS — Z7901 Long term (current) use of anticoagulants: Secondary | ICD-10-CM

## 2013-02-25 LAB — POCT INR: INR: 2.3

## 2013-02-25 NOTE — Patient Instructions (Signed)
Anticoagulation Dose Instructions as of 02/25/2013     Justin Ruiz Tue Wed Thu Fri Sat   New Dose 5 mg 7.5 mg 5 mg 5 mg 5 mg 7.5 mg 5 mg    Description       continue 7.5mg  mondays and fridays and 5mg  all other days.      INR was 2.3 today

## 2013-03-01 ENCOUNTER — Encounter: Payer: Self-pay | Admitting: Oncology

## 2013-03-01 NOTE — Progress Notes (Signed)
Ct ap @ [facility]  03/04/13 [id number] 03/01/13-04/15/13.

## 2013-03-04 ENCOUNTER — Encounter (HOSPITAL_COMMUNITY): Payer: Self-pay

## 2013-03-04 ENCOUNTER — Ambulatory Visit (HOSPITAL_COMMUNITY)
Admission: RE | Admit: 2013-03-04 | Discharge: 2013-03-04 | Disposition: A | Discharge status: Home / Self Care | Payer: Medicare Other | Source: Ambulatory Visit | Attending: Oncology | Admitting: Oncology

## 2013-03-04 ENCOUNTER — Other Ambulatory Visit: Payer: Medicare Other | Admitting: Lab

## 2013-03-04 DIAGNOSIS — K8020 Calculus of gallbladder without cholecystitis without obstruction: Secondary | ICD-10-CM | POA: Insufficient documentation

## 2013-03-04 DIAGNOSIS — R918 Other nonspecific abnormal finding of lung field: Secondary | ICD-10-CM | POA: Insufficient documentation

## 2013-03-04 DIAGNOSIS — C8589 Other specified types of non-Hodgkin lymphoma, extranodal and solid organ sites: Secondary | ICD-10-CM | POA: Insufficient documentation

## 2013-03-04 DIAGNOSIS — R599 Enlarged lymph nodes, unspecified: Secondary | ICD-10-CM | POA: Insufficient documentation

## 2013-03-04 LAB — CBC WITH DIFFERENTIAL/PLATELET
BASO%: 1.1 % (ref 0.0–2.0)
Basophils Absolute: 0.1 10*3/uL (ref 0.0–0.1)
HCT: 42.3 % (ref 38.4–49.9)
HGB: 13.8 g/dL (ref 13.0–17.1)
MCH: 30.2 pg (ref 27.2–33.4)
MCHC: 32.5 g/dL (ref 32.0–36.0)
MONO#: 0.5 10*3/uL (ref 0.1–0.9)
MONO%: 7.6 % (ref 0.0–14.0)
NEUT%: 61 % (ref 39.0–75.0)
RBC: 4.55 10*6/uL (ref 4.20–5.82)
RDW: 13.9 % (ref 11.0–14.6)
WBC: 6.3 10*3/uL (ref 4.0–10.3)
lymph#: 1.8 10*3/uL (ref 0.9–3.3)

## 2013-03-04 LAB — COMPREHENSIVE METABOLIC PANEL (CC13)
ALT: 13 U/L (ref 0–55)
AST: 14 U/L (ref 5–34)
Albumin: 3.1 g/dL — ABNORMAL LOW (ref 3.5–5.0)
Anion Gap: 8 mEq/L (ref 3–11)
BUN: 18.9 mg/dL (ref 7.0–26.0)
Calcium: 9.3 mg/dL (ref 8.4–10.4)
Chloride: 101 mEq/L (ref 98–109)
Potassium: 5.2 mEq/L — ABNORMAL HIGH (ref 3.5–5.1)
Total Protein: 6.8 g/dL (ref 6.4–8.3)

## 2013-03-04 MED ORDER — IOHEXOL 300 MG/ML  SOLN
100.0000 mL | Freq: Once | INTRAMUSCULAR | Status: AC | PRN
  Administered 2013-03-04: 100 mL via INTRAVENOUS

## 2013-03-07 ENCOUNTER — Other Ambulatory Visit: Payer: Self-pay | Admitting: Oncology

## 2013-03-07 ENCOUNTER — Telehealth: Payer: Self-pay | Admitting: *Deleted

## 2013-03-07 DIAGNOSIS — C8589 Other specified types of non-Hodgkin lymphoma, extranodal and solid organ sites: Secondary | ICD-10-CM

## 2013-03-07 NOTE — Telephone Encounter (Signed)
Per Dr. Cyndie Chime; notified pt no sign of lymphoma in abdomen but some non-specific changes seen at base of right lung--likely inflammation; Radiologist recommends a CT chest.  Pt verbalized understanding and stated they have called and scheduled it for 03/08/13 @ 9:30.  Pt expressed appreciation for call.

## 2013-03-07 NOTE — Telephone Encounter (Signed)
Message copied by Gala Romney on Thu Mar 07, 2013  4:46 PM ------      Message from: Levert Feinstein      Created: Thu Mar 07, 2013  3:33 PM       Call pt: no sign of lymphoma in abdomen.  Some non specific changes seen at base of right  lung - likely inflammation but Radiologist rec a CT chest - I will put in order for this & they will call him with date & time ------

## 2013-03-08 ENCOUNTER — Ambulatory Visit (HOSPITAL_COMMUNITY)
Admission: RE | Admit: 2013-03-08 | Discharge: 2013-03-08 | Disposition: A | Discharge status: Home / Self Care | Payer: Medicare Other | Source: Ambulatory Visit | Attending: Oncology | Admitting: Oncology

## 2013-03-08 ENCOUNTER — Telehealth: Payer: Self-pay | Admitting: Family Medicine

## 2013-03-08 ENCOUNTER — Telehealth: Payer: Self-pay | Admitting: *Deleted

## 2013-03-08 DIAGNOSIS — K8020 Calculus of gallbladder without cholecystitis without obstruction: Secondary | ICD-10-CM | POA: Insufficient documentation

## 2013-03-08 DIAGNOSIS — C8589 Other specified types of non-Hodgkin lymphoma, extranodal and solid organ sites: Secondary | ICD-10-CM | POA: Insufficient documentation

## 2013-03-08 DIAGNOSIS — R918 Other nonspecific abnormal finding of lung field: Secondary | ICD-10-CM | POA: Insufficient documentation

## 2013-03-08 DIAGNOSIS — E119 Type 2 diabetes mellitus without complications: Secondary | ICD-10-CM | POA: Insufficient documentation

## 2013-03-08 MED ORDER — IOHEXOL 300 MG/ML  SOLN
100.0000 mL | Freq: Once | INTRAMUSCULAR | Status: AC | PRN
  Administered 2013-03-08: 100 mL via INTRAVENOUS

## 2013-03-08 NOTE — Telephone Encounter (Signed)
5.7 is high for K- avoid K+ in diet and recheck Monday. We do not have scan results back yet

## 2013-03-08 NOTE — Telephone Encounter (Signed)
Message copied by Sabino Snipes on Fri Mar 08, 2013 10:29 AM ------      Message from: Levert Feinstein      Created: Mon Mar 04, 2013  3:16 PM       Call pt: potassium running high - if taking any supplements - stop and call his primary for advice ------

## 2013-03-08 NOTE — Telephone Encounter (Signed)
Notified pt of elevated K+.  He is not on any K+ replacement.  He will notify his PCP per Dr. Patsy Lager advice.

## 2013-03-11 ENCOUNTER — Encounter: Payer: Self-pay | Admitting: Oncology

## 2013-03-11 ENCOUNTER — Telehealth: Payer: Self-pay | Admitting: Oncology

## 2013-03-11 ENCOUNTER — Ambulatory Visit (HOSPITAL_BASED_OUTPATIENT_CLINIC_OR_DEPARTMENT_OTHER): Payer: Medicare Other | Admitting: Oncology

## 2013-03-11 VITALS — BP 127/81 | HR 86 | Temp 98.6°F | Resp 18 | Ht 71.0 in | Wt 325.2 lb

## 2013-03-11 DIAGNOSIS — C8589 Other specified types of non-Hodgkin lymphoma, extranodal and solid organ sites: Secondary | ICD-10-CM

## 2013-03-11 DIAGNOSIS — E119 Type 2 diabetes mellitus without complications: Secondary | ICD-10-CM

## 2013-03-11 DIAGNOSIS — I10 Essential (primary) hypertension: Secondary | ICD-10-CM

## 2013-03-11 DIAGNOSIS — R918 Other nonspecific abnormal finding of lung field: Secondary | ICD-10-CM

## 2013-03-11 HISTORY — DX: Other nonspecific abnormal finding of lung field: R91.8

## 2013-03-11 NOTE — Telephone Encounter (Signed)
Patient had BMP repeated today. Left message on his home voicemail to return call but this may have been handled when he had the labwork done.

## 2013-03-11 NOTE — Telephone Encounter (Signed)
Gave pt appt for lab and Md on February 2015 °

## 2013-03-16 NOTE — Progress Notes (Signed)
Hematology and Oncology Follow Up Visit  Justin Ruiz [id number] May 13, 1950 62 y.o. 03/16/2013 3:51 PM   Principle Diagnosis: Encounter Diagnoses  Name Primary?  Marland Kitchen LYMPHOMA   . 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 I elected to treat him with a more aggressive regimen than the standard CHOP, and he received Rituxan with infusional CHOPE. He did achieve a complete response by PET scan criteria with that scan done following completion of treatment on 06/09/2010. He still has some minimal residual adenopathy on CT scan which does not take up the PET tracer. This has continued to shrink over time, down from initial measurements of 5 x 3 cm to measurement on the scan done on 02/24/12 to  2.7 x 1.3 cm which has now been stable on serial scans. Scans in anticipation of today's visit on 03/08/2013 which I personally reviewed shows no new areas of adenopathy in the abdomen and pelvis. There is an atypical, nodular, right lower lobe infiltrate along the lingular likely inflammatory in nature. He did have a bronchitis about 2 weeks ago severe enough to require a course of steroids and antibiotics. Radiologist recommends CT scan in 3 months and I will go ahead and schedule this.  He has had no other interim medical problems. Diabetes under good control. He does continue to gain weight.     Medications: reviewed  Allergies:  Allergies  Allergen Reactions  . Avapro [Irbesartan]   . Lipitor [Atorvastatin Calcium] Other (See Comments)   Leg pain    Review of Systems:  ENT ROS: No sore throat Breast ROS:  Respiratory ROS: No cough or dyspnea. Recent bronchitis see above. Cardiovascular ROS:   No chest pain or palpitations Gastrointestinal ROS:   No abdominal pain or change in bowel habit Genito-Urinary ROS: No urinary tract symptoms  Musculoskeletal ROS: Chronic arthritis pain in his back as 2 Neurological ROS: No headache or change in vision S2 Dermatological ROS: No rash or ecchymosis Remaining ROS negative.  Physical Exam: Blood pressure 127/81, pulse 86, temperature 98.6 F (37 C), temperature source Oral, resp. rate 18, height 5\' 11"  (1.803 m), weight 325 lb 3.2 oz (147.51 kg). Wt Readings from Last 3 Encounters:  03/11/13 325 lb 3.2 oz (147.51 kg)  01/22/13 326 lb 12.8 oz (148.236 kg)  11/14/12 318 lb 9.6 oz (144.516 kg)     General appearance: Pleasant, obese, Caucasian man HENNT: Pharynx no erythema, exudate, mass, or ulcer. No thyromegaly or thyroid nodules Lymph nodes: No cervical, supraclavicular, or axillary lymphadenopathy Breasts:  Lungs: Clear to auscultation, resonant to percussion throughout Heart: Regular rhythm, no murmur, no gallop, no rub, no click, no edema Abdomen: Soft, nontender, normal bowel sounds, no mass, no organomegaly Extremities: No edema, no calf tenderness Musculoskeletal: no joint deformities GU: Vascular: Carotid pulses 2+, no bruits, distal pulses: Dorsalis pedis 1+ symmetric Neurologic: Alert, oriented, PERRLA,, cranial nerves grossly normal, motor strength 5 over 5, reflexes 1+ symmetric, upper body coordination normal, gait normal, Skin: No rash or ecchymosis  Lab Results: CBC W/Diff    Component Value Date/Time   WBC 6.3 03/04/2013  0756   WBC 7.3 11/14/2012 0931   WBC 5.9 03/29/2012   WBC 9.2 01/09/2012 1059   RBC 4.55 03/04/2013 0756   RBC 5.2 11/14/2012 0931   RBC 4.55 01/09/2012 1059   HGB 13.8 03/04/2013 0756   HGB 15.7 11/14/2012 0931   HGB 14.3 03/29/2012    HCT 42.3 03/04/2013 0756   HCT 47.5 11/14/2012 0931   HCT 44 03/29/2012   PLT 201 03/04/2013 0756   PLT 148* 03/29/2012   MCV 92.9 03/04/2013 0756   MCV 91.2 11/14/2012 0931   MCV 94.1 01/09/2012 1059   MCH 30.2 03/04/2013 0756   MCH 30.1 11/14/2012 0931   MCH 31.0 05/25/2010 1151   MCHC 32.5 03/04/2013 0756   MCHC 33.1 11/14/2012 0931   MCHC 32.6 01/09/2012 1059   RDW 13.9 03/04/2013 0756   RDW 14.0 01/09/2012 1059   LYMPHSABS 1.8 03/04/2013 0756   LYMPHSABS 1.5 01/09/2012 1059   MONOABS 0.5 03/04/2013 0756   MONOABS 0.5 01/09/2012 1059   EOSABS 0.1 03/04/2013 0756   EOSABS 0.0 01/09/2012 1059   BASOSABS 0.1 03/04/2013 0756   BASOSABS 0.0 01/09/2012 1059     Chemistry      Component Value Date/Time   NA 139 03/04/2013 0756   NA 141 11/14/2012 0918   NA 142 03/29/2012   NA 136 11/01/2011 0753   K 5.2* 03/04/2013 0756   K 4.5 11/14/2012 0918   K 4.8* 11/01/2011 0753   CL 100 11/14/2012 0918   CL 100 09/03/2012 0751   CL 91* 11/01/2011 0753   CO2 31* 03/04/2013 0756   CO2 28 11/14/2012 0918   CO2 34* 11/01/2011 0753   BUN 18.9 03/04/2013 0756   BUN 13 11/14/2012 0918   BUN 20 03/29/2012   BUN 18 11/01/2011 0753   CREATININE 0.8 03/04/2013 0756   CREATININE 0.82 11/14/2012 0918   CREATININE 0.8 03/29/2012   CREATININE 0.9 01/09/2012 1059   GLU 185 03/29/2012      Component Value Date/Time   CALCIUM 9.3 03/04/2013 0756   CALCIUM 9.6 11/14/2012 0918   CALCIUM 8.8 11/01/2011 0753   ALKPHOS 58 03/04/2013 0756   ALKPHOS 61 12/17/2012 1146   ALKPHOS 57 11/01/2011 0753   AST 14 03/04/2013 0756   AST 15 12/17/2012 1146   AST 14 11/01/2011 0753   ALT 13 03/04/2013 0756   ALT 12 12/17/2012 1146   ALT 17 11/01/2011 0753   BILITOT 0.97 03/04/2013 0756   BILITOT 0.8 12/17/2012 1146   BILITOT 1.50 11/01/2011 0753       Radiological Studies: Ct Chest W Contrast  03/08/2013   CLINICAL DATA:  Non-Hodgkin's lymphoma.  Diabetes.  EXAM: CT CHEST WITH CONTRAST  TECHNIQUE: Multidetector CT imaging of the chest was  performed during intravenous contrast administration.  CONTRAST:  OMNIPAQUE IOHEXOL 300 MG/ML  SOLN  COMPARISON:  09/03/2012 ; 02/24/2012  FINDINGS: Small mediastinal lymph nodes, not pathologically enlarged. Chololithiasis.  New peripheral nodular density posteriorly in the right upper lobe, 1.3 x 1.2 cm, image 25 series 5. Region of right lower lobe scarring with a somewhat nodular band density measuring 5.7 x 2.4 1.8 extending bronchovascular structures of the right lower lobe to the lung periphery, and more striking in the periphery. Otherwise scarring in the right lower lobe is stable.  New atelectasis medially in the left upper lobe on image 24 of series 5 there is several faint perihilar foci of ground-glass opacity on left. Accentuated indistinct peribronchovascular nodularity in the  lingula, images 37-39 of series 5. Patchy new peribronchovascular nodularity and ground-glass opacities in the left lower lobe, images 40-55 of series 5.  Volume loss and density noted medially in the right middle lobe, new from the prior exam, as shown on images 37-43 of series 5.  Bilateral airway thickening.  IMPRESSION: 1. Scattered regions of volume loss with associated density and nodularity extending to the lung periphery bilaterally. Given the underlying diffuse airway thickening, inflammatory and infectious etiologies are favored over malignancy. No central pulmonary embolus is identified although today's exam was not performed as a pulmonary embolus study. No new visualized adenopathy. I recommend followup chest CT in 3 months time in order to reassess. A more aggressive workup might include either percutaneous biopsy or bronchoscopy. 2. Cholelithiasis.   Electronically Signed   By: Herbie Baltimore M.D.   On: 03/08/2013 10:39   Ct Abdomen Pelvis W Contrast  03/04/2013   CLINICAL DATA:  Followup high-grade non-Hodgkin's lymphoma.  EXAM: CT ABDOMEN AND PELVIS WITH CONTRAST  TECHNIQUE: Multidetector CT imaging  of the abdomen and pelvis was performed using the standard protocol following bolus administration of intravenous contrast.  CONTRAST:  OMNIPAQUE IOHEXOL 300 MG/ML  SOLN  COMPARISON:  09/03/2012  FINDINGS: Images through the lung bases show incomplete visualization of on ill-defined opacity in the posterior right lower lobe, which could be due to infiltrate or neoplasm.  Mild retroperitoneal lymphadenopathy in the aortocaval space is unchanged, measuring 1.4 x 2.6 cm on image 41 compared to 1.4 x 2.5 cm previously. Other tiny less than 1 cm lymph nodes in the left paraaortic region and the right common iliac chain remain stable. No other pathologically enlarged nodes are identified within the abdomen or pelvis.  The spleen remains normal in size and appearance. The liver, pancreas, adrenal glands and kidneys remain normal in appearance. No evidence of hydronephrosis. Cholelithiasis is again demonstrated, without evidence of cholecystitis or biliary dilatation.  No evidence of inflammatory process or abnormal fluid collections. No evidence of bowel wall thickening, dilatation, or hernia. No suspicious bone lesions identified.  IMPRESSION: Stable mild retroperitoneal lymphadenopathy. No new or progressive lymphadenopathy within the abdomen or pelvis.  New ill-defined opacity in the right lower lobe is incompletely visualized. This could be due to inflammatory process or neoplasm, and chest CT is recommended for further evaluation.  Cholelithiasis.  No radiographic evidence of cholecystitis.   Electronically Signed   By: Myles Rosenthal M.D.   On: 03/04/2013 10:19    Impression:  #1. 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 He remains in a clinical and radiographic remission now out  3 years from diagnosis in September of 2011.  I will decrease CT scans every 6 months for the third year, annual times year 4 and 5 then on a when necessary basis only.  #2.  Pulmonary granulomas. Stable on serial radiographs.  #3. Insulin-dependent diabetes.  #4. Obesity sleep apnea syndrome.  #5. Herpes zoster infection right sacral dermatome may 2012  #6. Obstructive airway disease  #7. Coronary artery disease  #8. Hypertension.  #9. Hyperlipidemia  #10. pulmonary infiltrate left lower lobe. Resolved. #11. New right lower lobe infiltrate. Likely inflammatory. Followup CT scan in 3 months.    CC: Patient Care Team: [name], MD as PCP - General (Family Medicine)   Levert Feinstein, MD 11/22/20143:51 PM

## 2013-04-01 ENCOUNTER — Other Ambulatory Visit: Payer: Self-pay | Admitting: Family Medicine

## 2013-04-08 ENCOUNTER — Ambulatory Visit (INDEPENDENT_AMBULATORY_CARE_PROVIDER_SITE_OTHER): Payer: Medicare Other | Admitting: Pharmacist

## 2013-04-08 ENCOUNTER — Other Ambulatory Visit: Payer: Self-pay | Admitting: Pharmacist

## 2013-04-08 DIAGNOSIS — Z7901 Long term (current) use of anticoagulants: Secondary | ICD-10-CM

## 2013-04-08 DIAGNOSIS — I4891 Unspecified atrial fibrillation: Secondary | ICD-10-CM

## 2013-04-08 MED ORDER — INSULIN LISPRO PROT & LISPRO (50-50 MIX) 100 UNIT/ML KWIKPEN: 62.0000 [IU] | PEN_INJECTOR | Freq: Two times a day (BID) | SUBCUTANEOUS | Status: DC

## 2013-04-08 NOTE — Patient Instructions (Signed)
Anticoagulation Dose Instructions as of 04/08/2013     Justin Ruiz Tue Wed Thu Fri Sat   New Dose 5 mg 7.5 mg 5 mg 5 mg 5 mg 7.5 mg 5 mg    Description       continue 7.5mg  mondays and fridays and 5mg  all other days.      INR was 2.0 today

## 2013-04-09 ENCOUNTER — Other Ambulatory Visit: Payer: Self-pay | Admitting: Family Medicine

## 2013-04-10 NOTE — Telephone Encounter (Signed)
Last seen 11/14/12  DWM  Last glucose 11/17  Patient requesting a 90 day supply

## 2013-05-01 ENCOUNTER — Encounter: Payer: Self-pay | Admitting: Family Medicine

## 2013-05-01 ENCOUNTER — Ambulatory Visit (INDEPENDENT_AMBULATORY_CARE_PROVIDER_SITE_OTHER): Payer: Medicare HMO | Admitting: Family Medicine

## 2013-05-01 VITALS — BP 145/75 | HR 69 | Temp 97.0°F | Ht 71.0 in | Wt 326.0 lb

## 2013-05-01 DIAGNOSIS — J441 Chronic obstructive pulmonary disease with (acute) exacerbation: Secondary | ICD-10-CM

## 2013-05-01 DIAGNOSIS — F411 Generalized anxiety disorder: Secondary | ICD-10-CM

## 2013-05-01 DIAGNOSIS — E119 Type 2 diabetes mellitus without complications: Secondary | ICD-10-CM

## 2013-05-01 DIAGNOSIS — I10 Essential (primary) hypertension: Secondary | ICD-10-CM

## 2013-05-01 DIAGNOSIS — N390 Urinary tract infection, site not specified: Secondary | ICD-10-CM

## 2013-05-01 DIAGNOSIS — Z87898 Personal history of other specified conditions: Secondary | ICD-10-CM

## 2013-05-01 DIAGNOSIS — K219 Gastro-esophageal reflux disease without esophagitis: Secondary | ICD-10-CM

## 2013-05-01 DIAGNOSIS — E559 Vitamin D deficiency, unspecified: Secondary | ICD-10-CM

## 2013-05-01 DIAGNOSIS — C8589 Other specified types of non-Hodgkin lymphoma, extranodal and solid organ sites: Secondary | ICD-10-CM

## 2013-05-01 DIAGNOSIS — I4891 Unspecified atrial fibrillation: Secondary | ICD-10-CM

## 2013-05-01 DIAGNOSIS — Z7901 Long term (current) use of anticoagulants: Secondary | ICD-10-CM

## 2013-05-01 DIAGNOSIS — R918 Other nonspecific abnormal finding of lung field: Secondary | ICD-10-CM

## 2013-05-01 DIAGNOSIS — E785 Hyperlipidemia, unspecified: Secondary | ICD-10-CM

## 2013-05-01 HISTORY — DX: Morbid (severe) obesity due to excess calories: E66.01

## 2013-05-01 LAB — POCT UA - MICROSCOPIC ONLY
Casts, Ur, LPF, POC: NEGATIVE
Crystals, Ur, HPF, POC: NEGATIVE
Mucus, UA: NEGATIVE
Yeast, UA: NEGATIVE

## 2013-05-01 LAB — POCT URINALYSIS DIPSTICK
Bilirubin, UA: NEGATIVE
GLUCOSE UA: NEGATIVE
Ketones, UA: NEGATIVE
Nitrite, UA: NEGATIVE
Spec Grav, UA: 1.015
Urobilinogen, UA: NEGATIVE
pH, UA: 6.5

## 2013-05-01 LAB — POCT CBC
Granulocyte percent: 67.9 %G (ref 37–80)
HCT, POC: 44.8 % (ref 43.5–53.7)
Hemoglobin: 14.3 g/dL (ref 14.1–18.1)
Lymph, poc: 2 (ref 0.6–3.4)
MCH, POC: 29.1 pg (ref 27–31.2)
MCHC: 32.1 g/dL (ref 31.8–35.4)
MCV: 90.8 fL (ref 80–97)
MPV: 7.9 fL (ref 0–99.8)
PLATELET COUNT, POC: 196 10*3/uL (ref 142–424)
POC GRANULOCYTE: 4.7 (ref 2–6.9)
POC LYMPH %: 29 % (ref 10–50)
RBC: 4.9 M/uL (ref 4.69–6.13)
RDW, POC: 14 %
WBC: 6.9 10*3/uL (ref 4.6–10.2)

## 2013-05-01 LAB — POCT GLYCOSYLATED HEMOGLOBIN (HGB A1C): Hemoglobin A1C: 7.7

## 2013-05-01 LAB — POCT UA - MICROALBUMIN: MICROALBUMIN (UR) POC: 50 mg/L

## 2013-05-01 MED ORDER — ENALAPRIL MALEATE 20 MG PO TABS: ORAL_TABLET | ORAL | Status: DC

## 2013-05-01 MED ORDER — METFORMIN HCL 500 MG PO TABS: ORAL_TABLET | ORAL | Status: DC

## 2013-05-01 MED ORDER — INSULIN LISPRO PROT & LISPRO (50-50 MIX) 100 UNIT/ML KWIKPEN: 62.0000 [IU] | PEN_INJECTOR | Freq: Two times a day (BID) | SUBCUTANEOUS | Status: DC

## 2013-05-01 MED ORDER — WARFARIN SODIUM 5 MG PO TABS: ORAL_TABLET | ORAL | Status: DC

## 2013-05-01 MED ORDER — IPRATROPIUM-ALBUTEROL 0.5-2.5 (3) MG/3ML IN SOLN: 3.0000 mL | Freq: Four times a day (QID) | RESPIRATORY_TRACT | Status: DC | PRN

## 2013-05-01 MED ORDER — SIMVASTATIN 80 MG PO TABS: 80.0000 mg | ORAL_TABLET | Freq: Every day | ORAL | Status: DC

## 2013-05-01 MED ORDER — ALPRAZOLAM 0.25 MG PO TABS: 0.2500 mg | ORAL_TABLET | Freq: Every evening | ORAL | Status: DC | PRN

## 2013-05-01 MED ORDER — METOPROLOL TARTRATE 100 MG PO TABS
100.0000 mg | ORAL_TABLET | Freq: Two times a day (BID) | ORAL | Status: DC
Start: 2013-05-01 — End: 2014-04-03

## 2013-05-01 NOTE — Addendum Note (Signed)
Addended by: Selmer Dominion on: 05/01/2013 11:15 AM   Modules accepted: Orders

## 2013-05-01 NOTE — Progress Notes (Signed)
Subjective:    Patient ID: Justin Ruiz, male    DOB: 04-27-1950, 63 y.o.   MRN: [id number]  HPI Pt here for follow up and management of chronic medical problems. This patient has a multitude of problems and is followed by the pulmonologist, the oncologist,, and the cardiologist. The patient is due for a visit with the cardiologist and he will make that appointment. Health maintenance issues the patient is viewed a Prevnar vaccine and a shingles shot. He has not received a shingles shot in the past just due to the cost of the shot. His insurance has changed and he will check into that again.        Patient Active Problem List   Diagnosis Date Noted  . Lung nodules 03/11/2013  . Chronic anticoagulation 08/20/2012  . FIBRILLATION, ATRIAL 05/12/2010  . LYMPHOMA 01/26/2010  . OBESITY, UNSPECIFIED 04/27/2009  . CARDIOVASCULAR STUDIES, ABNORMAL 04/27/2009  . ABNORMAL STRESS ELECTROCARDIOGRAM 03/25/2009  . PERSONAL HISTORY OF COLONIC POLYPS 03/02/2009  . DIABETES, TYPE 2 04/29/2007  . HYPERLIPIDEMIA 04/29/2007  . Hypertension 04/29/2007  . Seasonal and perennial allergic rhinitis 04/29/2007  . COPD exacerbation 04/29/2007  . G E R D 04/29/2007  . BENIGN PROSTATIC HYPERTROPHY, HX OF 04/29/2007   Outpatient Encounter Prescriptions as of 05/01/2013  Medication Sig  . albuterol (PROAIR HFA) 108 (90 BASE) MCG/ACT inhaler Inhale 2 puffs into the lungs every 6 (six) hours as needed for wheezing or shortness of breath.  . ALPRAZolam (XANAX) 0.25 MG tablet Take 0.25 mg by mouth at bedtime as needed.    . Blood Glucose Monitoring Suppl (CLEVER CHEK AUTO-CODE VOICE) DEVI USE AS DIRECTED  . Cholecalciferol (VITAMIN D) 1000 UNITS capsule Take 5,000 Units by mouth daily.  Marland Kitchen CLEVER CHEK AUTO-CODE VOICE test strip USE TWICE DAILY AS DIRECTED FOR GLUCOSE TESTING AND MONITORING  . enalapril (VASOTEC) 20 MG tablet TAKE ONE TABLET BY MOUTH ONCE DAILY  . Insulin Lispro Prot & Lispro (HUMALOG MIX 50/50  KWIKPEN) (50-50) 100 UNIT/ML SUPN Inject 62 Units into the skin 2 (two) times daily.  Marland Kitchen ipratropium-albuterol (DUONEB) 0.5-2.5 (3) MG/3ML SOLN Take 3 mLs by nebulization every 6 (six) hours as needed.  Elmore Guise Devices (LANCING DEVICE) MISC USE AS DIRECTED  . metFORMIN (GLUCOPHAGE) 500 MG tablet TAKE ONE TABLET BY MOUTH IN THE MORNING AND TWO IN THE EVENING  . metoprolol (LOPRESSOR) 100 MG tablet Take 1 tablet (100 mg total) by mouth 2 (two) times daily.  . simvastatin (ZOCOR) 80 MG tablet Take 1 tablet (80 mg total) by mouth at bedtime.  Marland Kitchen warfarin (COUMADIN) 5 MG tablet 7.$RemoveBef'5mg'wBhkuJtUbA$  Monday and Friday:  $Remove'5mg'GApGqTu$  the rest of the wek.    Review of Systems  Constitutional: Negative.   HENT: Negative.   Eyes: Negative.   Respiratory: Negative.   Cardiovascular: Negative.   Gastrointestinal: Negative.   Endocrine: Negative.   Genitourinary: Negative.   Musculoskeletal: Negative.   Skin: Negative.   Allergic/Immunologic: Negative.   Neurological: Negative.   Hematological: Negative.   Psychiatric/Behavioral: Negative.        Objective:   Physical Exam  Nursing note and vitals reviewed. Constitutional: He is oriented to person, place, and time. He appears well-developed and well-nourished. No distress.  Pleasant cooperative and overweight  HENT:  Head: Normocephalic and atraumatic.  Right Ear: External ear normal.  Left Ear: External ear normal.  Nose: Nose normal.  Mouth/Throat: Oropharynx is clear and moist. No oropharyngeal exudate.  Eyes: Conjunctivae and EOM are normal. Pupils  are equal, round, and reactive to light. Right eye exhibits no discharge. Left eye exhibits no discharge. No scleral icterus.  Neck: Normal range of motion. Neck supple. No thyromegaly present.  Cardiovascular: Normal rate, normal heart sounds and intact distal pulses.  Exam reveals no gallop and no friction rub.   No murmur heard. Irregular irregular rhythm at 72 per minute  Pulmonary/Chest: Effort normal. No  respiratory distress. He has wheezes (expiratory wheezes). He has no rales. He exhibits no tenderness.  No axillary adenopathy  Abdominal: Soft. Bowel sounds are normal. He exhibits no mass. There is no tenderness. There is no rebound and no guarding.  . Obese abdomen with an appendectomy scar in the right lower quadrant  Musculoskeletal: Normal range of motion. He exhibits no edema and no tenderness.  Lymphadenopathy:    He has no cervical adenopathy.  Neurological: He is alert and oriented to person, place, and time. He has normal reflexes. No cranial nerve deficit.  Skin: Skin is warm and dry. No rash noted. No erythema. No pallor.  Psychiatric: He has a normal mood and affect. His behavior is normal. Judgment and thought content normal.   BP 145/75  Pulse 69  Temp(Src) 97 F (36.1 C) (Oral)  Ht _0  (1.803 m)  Wt 326 lb (147.873 kg)  BMI 45.49 kg/m2        Assessment & Plan:  1. DIABETES, TYPE 2 - POCT glycosylated hemoglobin (Hb A1C) - POCT CBC - POCT UA - Microalbumin  2. COPD exacerbation - POCT CBC  3. BENIGN PROSTATIC HYPERTROPHY, HX OF  4. G E R D  5. HYPERLIPIDEMIA - POCT CBC - NMR, lipoprofile  6. Hypertension - POCT CBC - POCT UA - Microalbumin - BMP8+EGFR - Hepatic function panel  7. LYMPHOMA - POCT CBC - warfarin (COUMADIN) 5 MG tablet; 7.33m Monday and Friday:  582mthe rest of the wek.  Dispense: 45 tablet; Refill: 6  8. Chronic anticoagulation - POCT CBC  9. FIBRILLATION, ATRIAL - POCT CBC  10. Vitamin D deficiency - Vit D  25 hydroxy (rtn osteoporosis monitoring)  11. Lung nodules - warfarin (COUMADIN) 5 MG tablet; 7.73m62monday and Friday:  73mg41me rest of the wek.  Dispense: 45 tablet; Refill: 6  12. Severe obesity (BMI >= 40) Meds ordered this encounter  Medications  . warfarin (COUMADIN) 5 MG tablet    Sig: 7.73mg 93mday and Friday:  73mg t33mrest of the wek.    Dispense:  45 tablet    Refill:  6  . simvastatin (ZOCOR) 80 MG  tablet    Sig: Take 1 tablet (80 mg total) by mouth at bedtime.    Dispense:  90 tablet    Refill:  3  . metoprolol (LOPRESSOR) 100 MG tablet    Sig: Take 1 tablet (100 mg total) by mouth 2 (two) times daily.    Dispense:  180 tablet    Refill:  3  . metFORMIN (GLUCOPHAGE) 500 MG tablet    Sig: TAKE ONE TABLET BY MOUTH IN THE MORNING AND TWO IN THE EVENING    Dispense:  270 tablet    Refill:  3  . ipratropium-albuterol (DUONEB) 0.5-2.5 (3) MG/3ML SOLN    Sig: Take 3 mLs by nebulization every 6 (six) hours as needed.    Dispense:  75 mL    Refill:  prn  . Insulin Lispro Prot & Lispro (HUMALOG MIX 50/50 KWIKPEN) (50-50) 100 UNIT/ML Kwikpen    Sig: Inject 62  Units into the skin 2 (two) times daily.    Dispense:  10 mL    Refill:  6  . enalapril (VASOTEC) 20 MG tablet    Sig: TAKE ONE TABLET BY MOUTH ONCE DAILY    Dispense:  30 tablet    Refill:  6  . ALPRAZolam (XANAX) 0.25 MG tablet    Sig: Take 1 tablet (0.25 mg total) by mouth at bedtime as needed.    Dispense:  30 tablet    Refill:  2   Patient Instructions  Continue current medications. Continue good therapeutic lifestyle changes which include good diet and exercise. Fall precautions discussed with patient. Schedule your flu vaccine if you haven't had it yet If you are over 3 years old - you may need Prevnar 32 or the adult Pneumonia vaccine. Be sure and check with your new insurance regarding the cost of the Zostavax or shingles shot. Also check about the cost of the Prevnar vaccine. Return the FOBT that was given to you today Keep your appointments with the cardiologist, the pulmonologist, and the oncologist. Use your inhalers more regularly Use a cool mist humidifier in her bedroom at nighttime Drink plenty of fluids Use Mucinex maximum strength one twice daily as needed for cough and congestion   Arrie Senate MD

## 2013-05-01 NOTE — Patient Instructions (Addendum)
Continue current medications. Continue good therapeutic lifestyle changes which include good diet and exercise. Fall precautions discussed with patient. Schedule your flu vaccine if you haven't had it yet If you are over 63 years old - you may need Prevnar 74 or the adult Pneumonia vaccine. Be sure and check with your new insurance regarding the cost of the Zostavax or shingles shot. Also check about the cost of the Prevnar vaccine. Return the FOBT that was given to you today Keep your appointments with the cardiologist, the pulmonologist, and the oncologist. Use your inhalers more regularly Use a cool mist humidifier in her bedroom at nighttime Drink plenty of fluids Use Mucinex maximum strength one twice daily as needed for cough and congestion

## 2013-05-02 ENCOUNTER — Other Ambulatory Visit: Payer: Self-pay | Admitting: *Deleted

## 2013-05-02 LAB — NMR, LIPOPROFILE
Cholesterol: 170 mg/dL (ref <200)
HDL CHOLESTEROL BY NMR: 45 mg/dL (ref >=40)
HDL Particle Number: 32.8 umol/L (ref >=30.5)
LDL PARTICLE NUMBER: 1257 nmol/L — AB (ref <1000)
LDL SIZE: 19.6 nm — AB (ref >20.5)
LDLC SERPL CALC-MCNC: 61 mg/dL (ref <100)
LP-IR Score: 66 — ABNORMAL HIGH (ref <=45)
Small LDL Particle Number: 846 nmol/L — ABNORMAL HIGH (ref <=527)
TRIGLYCERIDES BY NMR: 319 mg/dL — AB (ref <150)

## 2013-05-02 LAB — HEPATIC FUNCTION PANEL
ALT: 9 IU/L (ref 0–44)
AST: 13 IU/L (ref 0–40)
Albumin: 4.1 g/dL (ref 3.6–4.8)
Alkaline Phosphatase: 69 IU/L (ref 39–117)
BILIRUBIN DIRECT: 0.28 mg/dL (ref 0.00–0.40)
Total Bilirubin: 1.2 mg/dL (ref 0.0–1.2)
Total Protein: 6.8 g/dL (ref 6.0–8.5)

## 2013-05-02 LAB — BMP8+EGFR
BUN/Creatinine Ratio: 18 (ref 10–22)
BUN: 16 mg/dL (ref 8–27)
CO2: 29 mmol/L (ref 18–29)
Calcium: 9.5 mg/dL (ref 8.6–10.2)
Chloride: 94 mmol/L — ABNORMAL LOW (ref 97–108)
Creatinine, Ser: 0.88 mg/dL (ref 0.76–1.27)
GFR calc Af Amer: 106 mL/min/{1.73_m2} (ref >59)
GFR calc non Af Amer: 92 mL/min/{1.73_m2} (ref >59)
Glucose: 178 mg/dL — ABNORMAL HIGH (ref 65–99)
POTASSIUM: 4.5 mmol/L (ref 3.5–5.2)
SODIUM: 140 mmol/L (ref 134–144)

## 2013-05-02 LAB — VITAMIN D 25 HYDROXY (VIT D DEFICIENCY, FRACTURES): Vit D, 25-Hydroxy: 35.7 ng/mL (ref 30.0–100.0)

## 2013-05-02 MED ORDER — INSULIN LISPRO PROT & LISPRO (50-50 MIX) 100 UNIT/ML ~~LOC~~ SUSP: 62.0000 [IU] | Freq: Two times a day (BID) | SUBCUTANEOUS | Status: DC

## 2013-05-03 LAB — URINE CULTURE

## 2013-05-03 MED ORDER — PENICILLIN V POTASSIUM 500 MG PO TABS: 500.0000 mg | ORAL_TABLET | Freq: Four times a day (QID) | ORAL | Status: DC

## 2013-05-03 NOTE — Addendum Note (Signed)
Addended by: Chipper Herb on: 05/03/2013 08:50 PM   Modules accepted: Orders

## 2013-05-22 ENCOUNTER — Ambulatory Visit (INDEPENDENT_AMBULATORY_CARE_PROVIDER_SITE_OTHER): Payer: Medicare HMO | Admitting: Pharmacist

## 2013-05-22 DIAGNOSIS — I4891 Unspecified atrial fibrillation: Secondary | ICD-10-CM

## 2013-05-22 DIAGNOSIS — E119 Type 2 diabetes mellitus without complications: Secondary | ICD-10-CM

## 2013-05-22 DIAGNOSIS — Z7901 Long term (current) use of anticoagulants: Secondary | ICD-10-CM

## 2013-05-22 LAB — POCT INR: INR: 2.4

## 2013-05-22 NOTE — Patient Instructions (Signed)
Increase Humalog Mix 50/50 to 65 units twice a day  Anticoagulation Dose Instructions as of 05/22/2013     Justin Ruiz Tue Wed Thu Fri Sat   New Dose 5 mg 7.5 mg 5 mg 5 mg 5 mg 7.5 mg 5 mg    Description       Continue 7.5mg  mondays and fridays and 5mg  all other days.      INR was 2.4 today

## 2013-06-07 ENCOUNTER — Other Ambulatory Visit (HOSPITAL_BASED_OUTPATIENT_CLINIC_OR_DEPARTMENT_OTHER): Payer: Medicare PPO

## 2013-06-07 ENCOUNTER — Encounter (HOSPITAL_COMMUNITY): Payer: Self-pay

## 2013-06-07 ENCOUNTER — Encounter (INDEPENDENT_AMBULATORY_CARE_PROVIDER_SITE_OTHER): Payer: Self-pay

## 2013-06-07 ENCOUNTER — Ambulatory Visit (HOSPITAL_COMMUNITY)
Admission: RE | Admit: 2013-06-07 | Discharge: 2013-06-07 | Disposition: A | Discharge status: Home / Self Care | Payer: Medicare PPO | Source: Ambulatory Visit | Attending: Oncology | Admitting: Oncology

## 2013-06-07 DIAGNOSIS — R918 Other nonspecific abnormal finding of lung field: Secondary | ICD-10-CM | POA: Insufficient documentation

## 2013-06-07 DIAGNOSIS — I700 Atherosclerosis of aorta: Secondary | ICD-10-CM | POA: Insufficient documentation

## 2013-06-07 DIAGNOSIS — I10 Essential (primary) hypertension: Secondary | ICD-10-CM | POA: Insufficient documentation

## 2013-06-07 DIAGNOSIS — C8589 Other specified types of non-Hodgkin lymphoma, extranodal and solid organ sites: Secondary | ICD-10-CM

## 2013-06-07 DIAGNOSIS — E119 Type 2 diabetes mellitus without complications: Secondary | ICD-10-CM | POA: Insufficient documentation

## 2013-06-07 DIAGNOSIS — J438 Other emphysema: Secondary | ICD-10-CM | POA: Insufficient documentation

## 2013-06-07 DIAGNOSIS — I2510 Atherosclerotic heart disease of native coronary artery without angina pectoris: Secondary | ICD-10-CM | POA: Insufficient documentation

## 2013-06-07 DIAGNOSIS — Z87898 Personal history of other specified conditions: Secondary | ICD-10-CM | POA: Insufficient documentation

## 2013-06-07 DIAGNOSIS — K8020 Calculus of gallbladder without cholecystitis without obstruction: Secondary | ICD-10-CM | POA: Insufficient documentation

## 2013-06-07 DIAGNOSIS — E669 Obesity, unspecified: Secondary | ICD-10-CM | POA: Insufficient documentation

## 2013-06-07 DIAGNOSIS — R599 Enlarged lymph nodes, unspecified: Secondary | ICD-10-CM | POA: Insufficient documentation

## 2013-06-07 LAB — BASIC METABOLIC PANEL (CC13)
ANION GAP: 10 meq/L (ref 3–11)
BUN: 18.7 mg/dL (ref 7.0–26.0)
CALCIUM: 9.7 mg/dL (ref 8.4–10.4)
CO2: 30 mEq/L — ABNORMAL HIGH (ref 22–29)
Chloride: 102 mEq/L (ref 98–109)
Creatinine: 0.9 mg/dL (ref 0.7–1.3)
Glucose: 207 mg/dl — ABNORMAL HIGH (ref 70–140)
POTASSIUM: 4.8 meq/L (ref 3.5–5.1)
Sodium: 141 mEq/L (ref 136–145)

## 2013-06-07 MED ORDER — IOHEXOL 300 MG/ML  SOLN: 100.0000 mL | Freq: Once | INTRAMUSCULAR | Status: AC | PRN

## 2013-06-11 ENCOUNTER — Telehealth: Payer: Self-pay | Admitting: *Deleted

## 2013-06-11 ENCOUNTER — Ambulatory Visit: Payer: Medicare Other | Admitting: Oncology

## 2013-06-11 ENCOUNTER — Other Ambulatory Visit: Payer: Self-pay | Admitting: Oncology

## 2013-06-11 DIAGNOSIS — C8589 Other specified types of non-Hodgkin lymphoma, extranodal and solid organ sites: Secondary | ICD-10-CM

## 2013-06-11 NOTE — Telephone Encounter (Signed)
Received vm call from pt's wife stating that appt was cancelled due to weather today with Dr. Beryle Beams & they would like to know results of scan & r/s appt with Dr Beryle Beams.  Note to Dr Beryle Beams.

## 2013-06-12 ENCOUNTER — Ambulatory Visit: Payer: Commercial Managed Care - HMO | Admitting: Cardiology

## 2013-06-14 ENCOUNTER — Telehealth: Payer: Self-pay | Admitting: Oncology

## 2013-06-14 NOTE — Telephone Encounter (Signed)
Talked to pt today and will call him back after Dr. Darnell Level talkled to Radiology for PET Scan

## 2013-06-19 ENCOUNTER — Ambulatory Visit (HOSPITAL_BASED_OUTPATIENT_CLINIC_OR_DEPARTMENT_OTHER): Payer: Medicare PPO | Admitting: Oncology

## 2013-06-19 ENCOUNTER — Telehealth: Payer: Self-pay | Admitting: Oncology

## 2013-06-19 ENCOUNTER — Encounter (HOSPITAL_COMMUNITY)
Admission: RE | Admit: 2013-06-19 | Discharge: 2013-06-19 | Disposition: A | Discharge status: Home / Self Care | Payer: Medicare PPO | Source: Ambulatory Visit | Attending: Oncology | Admitting: Oncology

## 2013-06-19 ENCOUNTER — Telehealth: Payer: Self-pay | Admitting: Internal Medicine

## 2013-06-19 VITALS — BP 129/72 | HR 62 | Temp 98.8°F | Resp 20 | Ht 71.0 in | Wt 324.5 lb

## 2013-06-19 DIAGNOSIS — C8589 Other specified types of non-Hodgkin lymphoma, extranodal and solid organ sites: Secondary | ICD-10-CM | POA: Insufficient documentation

## 2013-06-19 DIAGNOSIS — C8582 Other specified types of non-Hodgkin lymphoma, intrathoracic lymph nodes: Secondary | ICD-10-CM

## 2013-06-19 DIAGNOSIS — R590 Localized enlarged lymph nodes: Secondary | ICD-10-CM

## 2013-06-19 DIAGNOSIS — I10 Essential (primary) hypertension: Secondary | ICD-10-CM

## 2013-06-19 DIAGNOSIS — J449 Chronic obstructive pulmonary disease, unspecified: Secondary | ICD-10-CM

## 2013-06-19 LAB — GLUCOSE, CAPILLARY: Glucose-Capillary: 205 mg/dL — ABNORMAL HIGH (ref 70–99)

## 2013-06-19 MED ORDER — FLUDEOXYGLUCOSE F - 18 (FDG) INJECTION
16.1000 | Freq: Once | INTRAVENOUS | Status: AC | PRN
  Administered 2013-06-19: 16.1 via INTRAVENOUS

## 2013-06-19 NOTE — Telephone Encounter (Signed)
, °

## 2013-06-19 NOTE — Telephone Encounter (Signed)
LMTCB-we can add patient to 07-03-13 at 11:15am slot. Thanks.

## 2013-06-19 NOTE — Telephone Encounter (Signed)
Was unable to reach anyone to leave a message as the mailbox is full; will need to try again later-need more information as far as what the follow up is for. Thanks.

## 2013-06-20 ENCOUNTER — Encounter: Payer: Self-pay | Admitting: Oncology

## 2013-06-20 DIAGNOSIS — R590 Localized enlarged lymph nodes: Secondary | ICD-10-CM | POA: Insufficient documentation

## 2013-06-20 HISTORY — DX: Localized enlarged lymph nodes: R59.0

## 2013-06-20 NOTE — Progress Notes (Signed)
Hematology and Oncology Follow Up Visit  Justin Ruiz [id number] [birth year] 63 y.o. 06/20/2013 8:44 AM   Principle Diagnosis: Encounter Diagnoses  Name Primary?  Marland Kitchen LYMPHOMA Yes  . Hilar adenopathy      Interim History:    Follow-up visit for this pleasant 63 year old man diagnosed with high-grade B-cell non-Hodgkin's lymphoma in September of 2011, when he was found to have retroperitoneal lymphadenopathy on a CT scan of the chest done for evaluation of some small pulmonary nodules. 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 I elected to treat him with a more aggressive regimen than the standard CHOP, and he received Rituxan with infusional CHOPE. He did achieve a complete response by PET scan criteria with that scan done following completion of treatment on 06/09/2010. He still has some minimal residual adenopathy on CT scan which does not take up the PET tracer. This has continued to shrink over time, down from initial measurements of 5 x 3 cm to measurement on the scan done on 02/24/12 to 2.7 x 1.3 cm which has now been stable on serial scans. Scans in anticipation of today's visit on 03/08/2013 which I personally reviewed shows no new areas of adenopathy in the abdomen and pelvis. There is an atypical, nodular, right lower lobe infiltrate along the lingular likely inflammatory in nature. He did have a bronchitis back in November 2014 severe enough to require a course of steroids and antibiotics. Radiologist recommended CT scan in 3 months. This study was done in anticipation of today's visit on 06/07/2013 and I personally reviewed the images with the radiologist. The right lower lobe infiltrate has completely resolved. However, there are new findings. There are small, mediastinal lymph nodes, which were not present on the study done in  November: Most prominent is a right infrahilar node measuring 1.5 cm. Additional 1 cm right paratracheal node and a 1.8 cm subcarinal node measuring 1.8 cm. A left mediastinal/infrahilar node 1.2 cm. No additional abnormal findings in the pulmonary parenchyma in this former smoker who stopped 8 years ago. I obtained a PET scan done this morning and I also reviewed these images with the radiologist prior to the patient's visit. There is hypermetabolic activity in the lymph nodes noted above. Most prominent area of activity and largest area of lymph nodes appears to be in the right hilar area. This is close to large blood vessels and best approach. A radiology would be attempt at transbronchial biopsy.  Patient denies any cough or dyspnea. He has known obesity sleep apnea syndrome. He does not use CPAP.    Medications: reviewed  Allergies:  Allergies  Allergen Reactions  . Avapro [Irbesartan]   . Lipitor [Atorvastatin Calcium] Other (See Comments)    Leg pain    Review of Systems: Constitutional symptoms: No anorexia, fevers, night sweats, or weight loss. Hematology:  No bleeding or bruising ENT ROS: No sore throat Breast ROS:  Respiratory ROS: No cough or dyspnea at rest  Cardiovascular ROS:  No chest pain or palpitations Gastrointestinal ROS:  No abdominal pain or change in bowel habit  Genito-Urinary ROS: Not question Musculoskeletal ROS: No muscle bone or joint pain Neurological ROS: No headache or change in vision Dermatological ROS: No rash or ecchymosis Remaining ROS negative:   Physical Exam: Blood pressure 129/72, pulse 62, temperature 98.8 F (37.1 C), temperature source Oral, resp. rate 20, height 5\' 11"  (  1.803 m), weight 324 lb 8 oz (147.192 kg). Wt Readings from Last 3 Encounters:  06/19/13 324 lb 8 oz (147.192 kg)  05/01/13 326 lb (147.873 kg)  03/11/13 325 lb 3.2 oz (147.51 kg)     General appearance: Large Caucasian man HENNT: Pharynx no erythema, exudate, mass,  or ulcer. No thyromegaly or thyroid nodules Lymph nodes: No cervical, supraclavicular, or axillary lymphadenopathy Breasts:  Lungs: Clear to auscultation, resonant to percussion throughout Heart: Regular rhythm, no murmur, no gallop, no rub, no click, no edema Abdomen: Soft, nontender, normal bowel sounds, no mass, no organomegaly Extremities: No edema, no calf tenderness Musculoskeletal: no joint deformities GU:  Vascular: Carotid pulses 2+, no bruits, Neurologic: Alert, oriented, PERRLA,  cranial nerves grossly normal, motor strength 5 over 5, reflexes 1+ symmetric, upper body coordination normal, gait normal, Skin: No rash or ecchymosis  Lab Results: CBC W/Diff    Component Value Date/Time   WBC 6.9 05/01/2013 1015   WBC 6.3 03/04/2013 0756   WBC 5.9 03/29/2012   WBC 9.2 01/09/2012 1059   RBC 4.9 05/01/2013 1015   RBC 4.55 03/04/2013 0756   RBC 4.55 01/09/2012 1059   HGB 14.3 05/01/2013 1015   HGB 13.8 03/04/2013 0756   HGB 14.3 03/29/2012   HCT 44.8 05/01/2013 1015   HCT 42.3 03/04/2013 0756   HCT 44 03/29/2012   PLT 201 03/04/2013 0756   PLT 148* 03/29/2012   MCV 90.8 05/01/2013 1015   MCV 92.9 03/04/2013 0756   MCV 94.1 01/09/2012 1059   MCH 29.1 05/01/2013 1015   MCH 30.2 03/04/2013 0756   MCH 31.0 05/25/2010 1151   MCHC 32.1 05/01/2013 1015   MCHC 32.5 03/04/2013 0756   MCHC 32.6 01/09/2012 1059   RDW 13.9 03/04/2013 0756   RDW 14.0 01/09/2012 1059   LYMPHSABS 1.8 03/04/2013 0756   LYMPHSABS 1.5 01/09/2012 1059   MONOABS 0.5 03/04/2013 0756   MONOABS 0.5 01/09/2012 1059   EOSABS 0.1 03/04/2013 0756   EOSABS 0.0 01/09/2012 1059   BASOSABS 0.1 03/04/2013 0756   BASOSABS 0.0 01/09/2012 1059     Chemistry      Component Value Date/Time   NA 141 06/07/2013 0759   NA 140 05/01/2013 0945   NA 141 11/14/2012 0918   NA 136 11/01/2011 0753   K 4.8 06/07/2013 0759   K 4.5 05/01/2013 0945   K 4.8* 11/01/2011 0753   CL 94* 05/01/2013 0945   CL 100 09/03/2012 0751   CL 91* 11/01/2011 0753   CO2 30*  06/07/2013 0759   CO2 29 05/01/2013 0945   CO2 34* 11/01/2011 0753   BUN 18.7 06/07/2013 0759   BUN 16 05/01/2013 0945   BUN 13 11/14/2012 0918   BUN 18 11/01/2011 0753   CREATININE 0.9 06/07/2013 0759   CREATININE 0.88 05/01/2013 0945   CREATININE 0.82 11/14/2012 0918   CREATININE 0.8 03/29/2012   GLU 185 03/29/2012      Component Value Date/Time   CALCIUM 9.7 06/07/2013 0759   CALCIUM 9.5 05/01/2013 0945   CALCIUM 8.8 11/01/2011 0753   ALKPHOS 69 05/01/2013 0945   ALKPHOS 58 03/04/2013 0756   ALKPHOS 57 11/01/2011 0753   AST 13 05/01/2013 0945   AST 14 03/04/2013 0756   AST 14 11/01/2011 0753   ALT 9 05/01/2013 0945   ALT 13 03/04/2013 0756   ALT 17 11/01/2011 0753   BILITOT 1.2 05/01/2013 0945   BILITOT 0.97 03/04/2013 0756   BILITOT 1.50 11/01/2011 0753  Radiological Studies: Ct Chest W Contrast  06/07/2013   CLINICAL DATA:  Followup of right lower lobe infiltrate. History of non-Hodgkin's lymphoma. Diabetes. Obesity. Hypertension. COPD.  EXAM: CT CHEST WITH CONTRAST  TECHNIQUE: Multidetector CT imaging of the chest was performed during intravenous contrast administration.  CONTRAST:  100 cc Omnipaque 300  COMPARISON:  CT CHEST W/CM dated 03/08/2013  FINDINGS: Lungs/Pleura: Fluid in the dependent trachea on image 13/ series 5. Similar to on the prior exam.  Mild centrilobular emphysema.  Mild motion degradation at the lung bases. The right lower lobe 2.4 x 5.7 cm opacity on the prior exam has resolved. Areas of peribronchovascular interstitial thickening are resolved.  Mild lower lobe predominant nodularity is increased. Example subpleural right middle and right lower lobe nodules on the order of 3 mm on image 33/series 5. A 3 mm perifissural right lower lobe nodule on image 39 has enlarged since the prior exam.  New or enlarged 3 mm left lower lobe lung nodule on image 45.  No pleural fluid.  Heart/Mediastinum: Aortic and branch vessel atherosclerosis. Mild cardiomegaly. Multivessel coronary artery  atherosclerosis. Borderline pulmonary artery enlargement, with the outflow tract measuring 3.1 cm. No pericardial effusion. No central pulmonary embolism, on this non-dedicated study.  1.0 cm right paratracheal node on image 21 has enlarged from 8 mm on the prior. Subcarinal/azygos esophageal recess node measures 1.8 cm on image 30 versus 1.0 cm on the prior. Developing right infrahilar adenopathy, at 1.5 cm on image 33.  Left mediastinal/infrahilar 1.2 cm node on image 33 has enlarged from 9 mm on the prior. Anterior cardiophrenic node on image 39 has enlarged since the prior exam.  Upper Abdomen: Artifact degradation within the upper abdomen secondary to patient body habitus. Cholelithiasis.  Bones/Musculoskeletal:  No acute osseous abnormality.  IMPRESSION: 1. Resolution of right lower lobe airspace disease since the prior exam. 2. Resolution of likely infectious peribronchovascular interstitial thickening. 3. Enlargement of mediastinal and hilar nodes, suspicious for recurrent lymphoma. Reactive adenopathy is felt less likely. 4. New and enlarging lower lobe predominant subpleural pulmonary nodules. Likely subpleural lymph nodes. Possibly related to recurrent lymphoma. 5. Decreased sensitivity and specificity exam due to technique related factors, as described above. 6. Atherosclerosis, including within the coronary arteries. 7. Cholelithiasis. 8. Borderline pulmonary artery enlargement may represent mild pulmonary arterial hypertension.   Electronically Signed   By: Abigail Miyamoto M.D.   On: 06/07/2013 09:20   Nm Pet Image Restag (ps) Skull Base To Thigh  06/19/2013   CLINICAL DATA:  Subsequent treatment strategy for restaging of lymphoma.  EXAM: NUCLEAR MEDICINE PET SKULL BASE TO THIGH  FASTING BLOOD GLUCOSE:  Value: 205 mg/dl  TECHNIQUE: 16.1 mCi F-18 FDG was injected intravenously. Full-ring PET imaging was performed from the skull base to thigh after the radiotracer. CT data was obtained and used for  attenuation correction and anatomic localization.  COMPARISON:  NM PET IMAGE RESTAG (PS) SKULL BASE TO THIGH dated 06/09/2010; CT CHEST W/CM dated 06/07/2013; CT CHEST W/CM dated 03/08/2013; CT ABD/PELVIS W CM dated 03/04/2013; CT ABD/PELVIS W CM dated 09/03/2012  FINDINGS: NECK  No areas of abnormal hypermetabolism.  CHEST  A prevascular node which measures 1.3 cm and a S.U.V. max of 3.4 on image 64/series 4. Right paratracheal node which measures 9 mm and a S.U.V. max of 2.9 on 61.  Right hilar hypermetabolism corresponding to 1.5 cm node on the prior exam. This measures a S.U.V. max of 6.8 on image 75/ series 4.  ABDOMEN/PELVIS  No areas of abnormal hypermetabolism.  SKELETON  No abnormal marrow activity.  CT IMAGES PERFORMED FOR ATTENUATION CORRECTION  No cervical adenopathy or other significant findings within the neck.  Chest findings deferred to recent diagnostic CT. Small pulmonary nodules and coronary artery atherosclerosis again identified.  Cholelithiasis. Retroperitoneal adenopathy. A retrocaval node measures 1.5 x 2.6 cm on image 135. 1.4 x 2.6 cm on the 03/04/2013 exam. This is similar back to 09/03/2012. Bilateral fat containing inguinal hernias.  IMPRESSION: 1. Hypermetabolism corresponding to enlarging thoracic lymph nodes. This is suspicious for recurrent lymphoma. 2. No evidence of extrathoracic hypermetabolic adenopathy; retroperitoneal abdominal nodes are enlarged but not hypermetabolic and similar over prior exams. Favor treated lymphoma. 3. Incidental findings, including cholelithiasis and coronary artery atherosclerosis.   Electronically Signed   By: Abigail Miyamoto M.D.   On: 06/19/2013 10:17    Impression:  Hypermetabolic mediastinal lymphadenopathy in a 63 year old man now out 4 years from diagnosis and treatment of high-grade B-cell non-Hodgkin's lymphoma. No other areas of abnormal metabolic activity outside of the mediastinum. No obvious pulmonary parenchymal lesions in this former  smoker.  Plan: Status discussed with the patient and his wife and x-ray images reviewed with them. I feel that we need to proceed with a biopsy to prove that this is recurrent lymphoma and not a second primary pulmonary malignancy. I will make a referral to pulmonary medicine. He has underlying coronary disease and insulin-dependent diabetes. He will also need a cardiac reevaluation. If this is recurrent lymphoma, standard therapy would be induction chemotherapy for 3 or 4 cycles then referral for stem cell harvest with subsequent high-dose chemotherapy and   stem cell reconstitution.  #2. Pulmonary granulomas. Stable on serial radiographs.  #3. Insulin-dependent diabetes.  #4. Obesity sleep apnea syndrome.  #5. Herpes zoster infection right sacral dermatome may 2012  #6. Obstructive airway disease  #7. Coronary artery disease  #8. Hypertension.  #9. Hyperlipidemia  #10. pulmonary infiltrate left lower lobe. Resolved. #11. Right lower lobe pulmonary infiltrate-resolved  Time spent with exam, direct face-to-face contact with patient and wife, review of images with radiology and reviewed images with patient and wife, and coordination of care, over 40 minutes.    CC: Patient Care Team: Chipper Herb, MD as PCP - General (Family Medicine)   Annia Belt, MD 2/26/20158:44 AM

## 2013-06-21 ENCOUNTER — Telehealth: Payer: Self-pay | Admitting: Oncology

## 2013-06-21 ENCOUNTER — Ambulatory Visit (HOSPITAL_COMMUNITY): Payer: Medicare PPO

## 2013-06-21 NOTE — Telephone Encounter (Signed)
Called and spoke with nurse with Dr Beryle Beams Pt already est with CDY so no cons slot needed  Dr Darnell Level wants Dr Annamaria Boots to see him for abn PET  Appt for 06/26/13 at 2:45 pm  Nothing further needed

## 2013-06-21 NOTE — Telephone Encounter (Signed)
, °

## 2013-06-22 ENCOUNTER — Telehealth: Payer: Self-pay | Admitting: *Deleted

## 2013-06-22 ENCOUNTER — Encounter: Payer: Self-pay | Admitting: Oncology

## 2013-06-22 NOTE — Telephone Encounter (Signed)
Mailed provider change letter to pt w/ calendar and cancelled Dr. G's appt. 

## 2013-06-25 ENCOUNTER — Telehealth: Payer: Self-pay | Admitting: Family Medicine

## 2013-06-26 ENCOUNTER — Ambulatory Visit (INDEPENDENT_AMBULATORY_CARE_PROVIDER_SITE_OTHER): Payer: Medicare PPO | Admitting: Internal Medicine

## 2013-06-26 ENCOUNTER — Encounter: Payer: Self-pay | Admitting: Internal Medicine

## 2013-06-26 VITALS — BP 122/76 | HR 88 | Temp 98.8°F | Ht 71.0 in | Wt 331.8 lb

## 2013-06-26 DIAGNOSIS — R918 Other nonspecific abnormal finding of lung field: Secondary | ICD-10-CM

## 2013-06-26 DIAGNOSIS — J441 Chronic obstructive pulmonary disease with (acute) exacerbation: Secondary | ICD-10-CM

## 2013-06-26 NOTE — Progress Notes (Signed)
12/02/11- 62 yoM former smoker followed for COPD, OSA, complicated by hx lymphoma, AFib/ coumadin PFT 11/23/06- 0.47.   Hx 35 pk yr cigs.  LOV- 06/28/10  Patient states doing "pretty good". c/o sob with exertion. denies chest pain, chest tightness, wheezing, and cough. DOE especially on stairs- no change. Uses neb 1-2x/ yr. Seasonal pollens bother some.  OSA status not addressed this visit. COPD Assessment Test( CAT) 16/24  01/09/12-  61 yoM former smoker followed for COPD, OSA, complicated by hx lymphoma, AFib/ coumadin ACUTE VISIT: unable to lay flat-not able to breathe; Increased SOB and wheezing-getting worse; breathing tx at 4am and used rescue inhaler as well; has been going on since yesterday. Wife here On arrival today o2 sat 80, responded to 3 L O2. COPD assessment test (CAT) 40/40. Did well at [facility] on vacation without recognized fluid retention or other acute event. Home over the past week- Gradually worse shortness of breath x2 days with increased cough, watery rhinorrhea. Rattling chest congestion x2 days. Denies fever, pain, green, blood, sore throat. Wife gave antihistamine yesterday. CT chest 11/01/11-reviewed with them IMPRESSION:  1. New airspace opacity in the left lower lobe - pneumonia or  pulmonary hemorrhage could give a similar appearance. Follow-up  chest radiography to ensure clearance is recommended.  2. Several small right lung nodules are stable and nonspecific due  to small size.  3. Mediastinal lymph nodes are slightly increased in size compared  the prior exam, but currently within normal size limits.  4. Atherosclerosis.   01/22/13- 61 yoM former smoker followed for COPD, OSA, complicated by hx lymphoma, AFib/ coumadin pt reports breathing is doing well-- denies any other concerns at this time Schedule for vaccine next week. Denies cough wheeze or active concern. Seasonal rhinitis with watery nose. Rescue inhaler sometimes  once a day, currently 2 or 3 times per  week. Denies any concerns about potential cough relation enalapril-discussed CXR 09/03/12 IMPRESSION:  No acute or metastatic cardiopulmonary abnormality.  rec Refills for rescue inhaler and nebulizer     06/26/2013 pulmonary consultation /Johnathin Vanderschaaf re: ? Recurrent lymphoma vs ca Chief Complaint  Patient presents with  . Follow-up    Pt here at the request of Dr. Beryle Beams for eval of PET and possible Bronch. Pt reports having increased DOE for the past month.  doe indolent onset x steps, flat ok - no cough on ACEi  Has duoneb/ saba hfa but hasn't tried them before desired activity  No obvious day to day or daytime variabilty or assoc chronic cough or cp or chest tightness, subjective wheeze overt sinus or hb symptoms. No unusual exp hx or h/o childhood pna/ asthma or knowledge of premature birth.  Sleeping ok without nocturnal  or early am exacerbation  of respiratory  c/o's or need for noct saba. Also denies any obvious fluctuation of symptoms with weather or environmental changes or other aggravating or alleviating factors except as outlined above   Current Medications, Allergies, Complete Past Medical History, Past Surgical History, Family History, and Social History were reviewed in [employer] record.  ROS  The following are not active complaints unless bolded sore throat, dysphagia, dental problems, itching, sneezing,  nasal congestion or excess/ purulent secretions, ear ache,   fever, chills, sweats, unintended wt loss, pleuritic or exertional cp, hemoptysis,  orthopnea pnd or leg swelling, presyncope, palpitations, heartburn, abdominal pain, anorexia, nausea, vomiting, diarrhea  or change in bowel or urinary habits, change in stools or urine, dysuria,hematuria,  rash,  arthralgias, visual complaints, headache, numbness weakness or ataxia or problems with walking or coordination,  change in mood/affect or memory.        Pex:  Somewhat frail amb male nad  Wt  Readings from Last 3 Encounters:  06/26/13 331 lb 12.8 oz (150.503 kg)  06/19/13 324 lb 8 oz (147.192 kg)  05/01/13 326 lb (147.873 kg)    HEENT mild turbinate edema.  Oropharynx no thrush or excess pnd or cobblestoning.  No JVD or cervical adenopathy. Mild accessory muscle hypertrophy. Trachea midline, nl thryroid. Chest was hyperinflated by percussion with diminished breath sounds and moderate increased exp time without wheeze. Hoover sign positive at mid inspiration. Regular rate and rhythm without murmur gallop or rub or increase P2 or edema.  Abd: no hsm, nl excursion. Ext warm without cyanosis or clubbing.

## 2013-06-26 NOTE — Patient Instructions (Addendum)
If cough or throat clearing get worse, we will need to get you off your vasotec   Try inhaler to see whether helps your breathing with activity   I will present your case to Dr Lamonte Sakai to see if he can obtain enough tissue from the area on your scan next to your trachea.  Late add:  Discussed with Dr Lamonte Sakai, low yield from EBUS here, esp for lymphoma, rec re-eval in 3 m vs mediastinoscopy now

## 2013-06-28 NOTE — Assessment & Plan Note (Signed)
PFT 11/23/06- FEV1/FVC 0.47  rx duoneb prn   May need to consider trial off ACEi if breathing/ cough worsen  See instructions for specific recommendations which were reviewed directly with the patient who was given a copy with highlighter outlining the key components.

## 2013-06-28 NOTE — Assessment & Plan Note (Signed)
See CT chest 06/07/13 with assoc adenopathy but all < 2 cm and Pos PET  06/07/13   Case discussed in detail with Dr Lamonte Sakai - pt poor candidate for EBUS which is likely to be non-dx given the likelihood this is a lymphoma and the nodes and nodules are so small.  If early dx of recurrent lymphoma or met ca is clinically important here (vs waiting another 3 months for larger target) then best option is for mediastinoscopy per T surgery

## 2013-07-02 ENCOUNTER — Telehealth: Payer: Self-pay | Admitting: *Deleted

## 2013-07-02 NOTE — Telephone Encounter (Addendum)
Received call from pt's wife stating that pt saw Dr. Melvyn Novas & he was going to discuss his case with Dr. Lamonte Sakai & they have not heard anything back.  She also states that pt's appt has been canceled with Dr Beryle Beams & moved to 07/15/13 with Dr Julien Nordmann & they thought they were going to see Dr. Benay Spice.  Note to Dr Beryle Beams.

## 2013-07-03 ENCOUNTER — Other Ambulatory Visit: Payer: Medicare PPO

## 2013-07-03 ENCOUNTER — Ambulatory Visit: Payer: Medicare PPO | Admitting: Oncology

## 2013-07-03 ENCOUNTER — Ambulatory Visit (INDEPENDENT_AMBULATORY_CARE_PROVIDER_SITE_OTHER): Payer: Medicare HMO | Admitting: Pharmacist

## 2013-07-03 DIAGNOSIS — I4891 Unspecified atrial fibrillation: Secondary | ICD-10-CM

## 2013-07-03 DIAGNOSIS — Z7901 Long term (current) use of anticoagulants: Secondary | ICD-10-CM

## 2013-07-03 LAB — POCT INR: INR: 2.6

## 2013-07-03 NOTE — Patient Instructions (Signed)
Anticoagulation Dose Instructions as of 07/03/2013     Justin Ruiz Tue Wed Thu Fri Sat   New Dose 5 mg 7.5 mg 5 mg 5 mg 5 mg 7.5 mg 5 mg    Description       Continue 7.5mg  mondays and fridays and 5mg  all other days.      INR was 2.6 today

## 2013-07-11 ENCOUNTER — Other Ambulatory Visit: Payer: Self-pay | Admitting: *Deleted

## 2013-07-11 ENCOUNTER — Telehealth: Payer: Self-pay | Admitting: Internal Medicine

## 2013-07-11 NOTE — Telephone Encounter (Signed)
returned pt call to r/s appt and s.w pt stated there was death in family but he will keep appt.Justin KitchenMarland Ruiz

## 2013-07-15 ENCOUNTER — Ambulatory Visit (HOSPITAL_BASED_OUTPATIENT_CLINIC_OR_DEPARTMENT_OTHER): Payer: Medicare PPO | Admitting: Internal Medicine

## 2013-07-15 ENCOUNTER — Other Ambulatory Visit (HOSPITAL_BASED_OUTPATIENT_CLINIC_OR_DEPARTMENT_OTHER): Payer: Commercial Managed Care - HMO

## 2013-07-15 ENCOUNTER — Telehealth: Payer: Self-pay | Admitting: Internal Medicine

## 2013-07-15 ENCOUNTER — Encounter: Payer: Self-pay | Admitting: Internal Medicine

## 2013-07-15 VITALS — BP 142/91 | HR 72 | Temp 97.0°F | Resp 18 | Ht 71.0 in | Wt 328.4 lb

## 2013-07-15 DIAGNOSIS — R918 Other nonspecific abnormal finding of lung field: Secondary | ICD-10-CM

## 2013-07-15 DIAGNOSIS — I10 Essential (primary) hypertension: Secondary | ICD-10-CM

## 2013-07-15 DIAGNOSIS — C8589 Other specified types of non-Hodgkin lymphoma, extranodal and solid organ sites: Secondary | ICD-10-CM

## 2013-07-15 DIAGNOSIS — E119 Type 2 diabetes mellitus without complications: Secondary | ICD-10-CM

## 2013-07-15 NOTE — Telephone Encounter (Signed)
gv adn printed appt sched and avs for pt for May...gv pt barium °

## 2013-07-15 NOTE — Progress Notes (Signed)
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 2) hypermetabolic mediastinal lymphadenopathy.  PRIOR THERAPY: Status post systemic chemotherapy with 6 cycles of infusional CHOPE with Rituxan.  CURRENT THERAPY: Observation  INTERVAL HISTORY: Justin Ruiz 63 y.o. male returns to the clinic today for followup visit. He is a former patient of Dr. Beryle Beams is here today to establish care with me after Dr. Beryle Beams left the practice. The patient is feeling fine with no specific complaints except for shortness breath with exertion secondary to his moderate obesity and lack of exercise. He denied having any significant weight loss or night sweats. He denied having any chest pain but has shortness breath with exertion with no cough or hemoptysis. The patient denied having any significant nausea or vomiting. He has no palpable lymphadenopathy. A recent PET scan showed some questionable mediastinal lymphadenopathy but they are too small to be biopsied at this point with bronchoscopy and endobronchial ultrasound. He was seen recently by Dr. Melvyn Novas. The patient is here today for evaluation and studies care with me.  MEDICAL HISTORY: Past Medical History  Diagnosis Date  . Diabetes mellitus   . Asthma   . Hyperlipidemia   . Obesity     exogenous  . Impotence   . Wheezing   . Left knee pain   . BPH (benign prostatic hypertrophy)   . Colon polyps   . Diabetes mellitus  type 2   . COPD (chronic obstructive pulmonary disease)   . A-fib   . Shingles   . AR (allergic rhinitis)   . GERD (gastroesophageal reflux disease)   . Hx of colonic polyps   . Arthritis   . Asthma   . HTN (hypertension)     x 3 years  . Pneumonia   . Cancer   . NHL (non-Hodgkin's lymphoma)     nhl dx 9/11  .  Prostate cancer     pt states no prostate ca  . B-cell lymphoma   . Non Hodgkin's lymphoma b cell  . Lung nodules 03/11/2013  . Mediastinal adenopathy 06/20/2013    CT  & PET 2/15     ALLERGIES:  is allergic to avapro and lipitor.  MEDICATIONS:  Current Outpatient Prescriptions  Medication Sig Dispense Refill  . albuterol (PROAIR HFA) 108 (90 BASE) MCG/ACT inhaler Inhale 2 puffs into the lungs every 6 (six) hours as needed for wheezing or shortness of breath.  1 Inhaler  prn  . ALPRAZolam (XANAX) 0.25 MG tablet Take 1 tablet (0.25 mg total) by mouth at bedtime as needed.  30 tablet  2  . Blood Glucose Monitoring Suppl (CLEVER CHEK AUTO-CODE VOICE) DEVI USE AS DIRECTED  1 each  2  . Cholecalciferol (VITAMIN D) 1000 UNITS capsule Take 5,000 Units by mouth daily.      Marland Kitchen CLEVER CHEK AUTO-CODE VOICE test strip USE TWICE DAILY AS DIRECTED FOR GLUCOSE TESTING AND MONITORING  200 each  2  . enalapril (VASOTEC) 20 MG tablet TAKE ONE TABLET BY MOUTH ONCE DAILY  30 tablet  6  . insulin lispro protamine-lispro (HUMALOG MIX 50/50) (50-50) 100 UNIT/ML SUSP injection Inject 65 Units into the skin 2 (two) times daily before a meal.  40 mL  11  . ipratropium-albuterol (DUONEB) 0.5-2.5 (3) MG/3ML SOLN Take 3 mLs by nebulization  every 6 (six) hours as needed.  75 mL  prn  . Lancet Devices (LANCING DEVICE) MISC USE AS DIRECTED  1 each  2  . metFORMIN (GLUCOPHAGE) 500 MG tablet TAKE ONE TABLET BY MOUTH IN THE MORNING AND TWO IN THE EVENING  270 tablet  3  . metoprolol (LOPRESSOR) 100 MG tablet Take 1 tablet (100 mg total) by mouth 2 (two) times daily.  180 tablet  3  . simvastatin (ZOCOR) 80 MG tablet Take 1 tablet (80 mg total) by mouth at bedtime.  90 tablet  3  . warfarin (COUMADIN) 5 MG tablet 7.5mg  Monday and Friday:  5mg  the rest of the wek.  45 tablet  6  . [DISCONTINUED] digoxin (LANOXIN) 0.25 MG tablet Take 250 mcg by mouth daily.        No current facility-administered medications for this visit.     SURGICAL HISTORY:  Past Surgical History  Procedure Laterality Date  . Appendectomy      REVIEW OF SYSTEMS:  Constitutional: negative Eyes: negative Ears, nose, mouth, throat, and face: negative Respiratory: positive for dyspnea on exertion Cardiovascular: negative Gastrointestinal: negative Genitourinary:negative Integument/breast: negative Hematologic/lymphatic: negative Musculoskeletal:negative Neurological: negative Behavioral/Psych: negative Endocrine: negative Allergic/Immunologic: negative   PHYSICAL EXAMINATION: General appearance: alert, cooperative and no distress Head: Normocephalic, without obvious abnormality, atraumatic Neck: no adenopathy, no JVD, supple, symmetrical, trachea midline and thyroid not enlarged, symmetric, no tenderness/mass/nodules Lymph nodes: Cervical, supraclavicular, and axillary nodes normal. Resp: clear to auscultation bilaterally Back: symmetric, no curvature. ROM normal. No CVA tenderness. Cardio: regular rate and rhythm, S1, S2 normal, no murmur, click, rub or gallop GI: soft, non-tender; bowel sounds normal; no masses,  no organomegaly Extremities: extremities normal, atraumatic, no cyanosis or edema Neurologic: Alert and oriented X 3, normal strength and tone. Normal symmetric reflexes. Normal coordination and gait  ECOG PERFORMANCE STATUS: 1 - Symptomatic but completely ambulatory  Blood pressure 142/91, pulse 72, temperature 97 F (36.1 C), temperature source Oral, resp. rate 18, height 5\' 11"  (1.803 m), weight 328 lb 6.4 oz (148.961 kg), SpO2 92.00%.  LABORATORY DATA: Lab Results  Component Value Date   WBC 6.9 05/01/2013   HGB 14.3 05/01/2013   HCT 44.8 05/01/2013   MCV 90.8 05/01/2013   PLT 201 03/04/2013      Chemistry      Component Value Date/Time   NA 141 06/07/2013 0759   NA 140 05/01/2013 0945   NA 141 11/14/2012 0918   NA 136 11/01/2011 0753   K 4.8 06/07/2013 0759   K 4.5 05/01/2013 0945   K 4.8* 11/01/2011 0753   CL  94* 05/01/2013 0945   CL 100 09/03/2012 0751   CL 91* 11/01/2011 0753   CO2 30* 06/07/2013 0759   CO2 29 05/01/2013 0945   CO2 34* 11/01/2011 0753   BUN 18.7 06/07/2013 0759   BUN 16 05/01/2013 0945   BUN 13 11/14/2012 0918   BUN 18 11/01/2011 0753   CREATININE 0.9 06/07/2013 0759   CREATININE 0.88 05/01/2013 0945   CREATININE 0.82 11/14/2012 0918   CREATININE 0.8 03/29/2012   GLU 185 03/29/2012      Component Value Date/Time   CALCIUM 9.7 06/07/2013 0759   CALCIUM 9.5 05/01/2013 0945   CALCIUM 8.8 11/01/2011 0753   ALKPHOS 69 05/01/2013 0945   ALKPHOS 58 03/04/2013 0756   ALKPHOS 57 11/01/2011 0753   AST 13 05/01/2013 0945   AST 14 03/04/2013 0756   AST 14 11/01/2011 0753   ALT 9  05/01/2013 0945   ALT 13 03/04/2013 0756   ALT 17 11/01/2011 0753   BILITOT 1.2 05/01/2013 0945   BILITOT 0.97 03/04/2013 0756   BILITOT 1.50 11/01/2011 0753       RADIOGRAPHIC STUDIES: Nm Pet Image Restag (ps) Skull Base To Thigh  06/19/2013   CLINICAL DATA:  Subsequent treatment strategy for restaging of lymphoma.  EXAM: NUCLEAR MEDICINE PET SKULL BASE TO THIGH  FASTING BLOOD GLUCOSE:  Value: 205 mg/dl  TECHNIQUE: 16.1 mCi F-18 FDG was injected intravenously. Full-ring PET imaging was performed from the skull base to thigh after the radiotracer. CT data was obtained and used for attenuation correction and anatomic localization.  COMPARISON:  NM PET IMAGE RESTAG (PS) SKULL BASE TO THIGH dated 06/09/2010; CT CHEST W/CM dated 06/07/2013; CT CHEST W/CM dated 03/08/2013; CT ABD/PELVIS W CM dated 03/04/2013; CT ABD/PELVIS W CM dated 09/03/2012  FINDINGS: NECK  No areas of abnormal hypermetabolism.  CHEST  A prevascular node which measures 1.3 cm and a S.U.V. max of 3.4 on image 64/series 4. Right paratracheal node which measures 9 mm and a S.U.V. max of 2.9 on 61.  Right hilar hypermetabolism corresponding to 1.5 cm node on the prior exam. This measures a S.U.V. max of 6.8 on image 75/ series 4.  ABDOMEN/PELVIS  No areas of abnormal hypermetabolism.   SKELETON  No abnormal marrow activity.  CT IMAGES PERFORMED FOR ATTENUATION CORRECTION  No cervical adenopathy or other significant findings within the neck.  Chest findings deferred to recent diagnostic CT. Small pulmonary nodules and coronary artery atherosclerosis again identified.  Cholelithiasis. Retroperitoneal adenopathy. A retrocaval node measures 1.5 x 2.6 cm on image 135. 1.4 x 2.6 cm on the 03/04/2013 exam. This is similar back to 09/03/2012. Bilateral fat containing inguinal hernias.  IMPRESSION: 1. Hypermetabolism corresponding to enlarging thoracic lymph nodes. This is suspicious for recurrent lymphoma. 2. No evidence of extrathoracic hypermetabolic adenopathy; retroperitoneal abdominal nodes are enlarged but not hypermetabolic and similar over prior exams. Favor treated lymphoma. 3. Incidental findings, including cholelithiasis and coronary artery atherosclerosis.   Electronically Signed   By: Abigail Miyamoto M.D.   On: 06/19/2013 10:17    ASSESSMENT AND PLAN: This is a very pleasant 63 years old white male with history of high-grade be set non-Hodgkin lymphoma suspicious for Burkitt's lymphoma but with negative cytogenetics for 8;14 abnormality status post 6 cycles of systemic chemotherapy with Rituxan and infusional CHOPE completed in early 2012.  He has been observation since that time was no evidence for disease recurrence except for small mediastinal lymphadenopathy seen on recent PET scan but they are too small to be diagnosed with bronchoscopy and endobronchial ultrasound. I discussed the scan results with the patient today. I recommended for him to continue on observation with repeat CT scan of the chest, abdomen and pelvis in 2 months for reevaluation of his disease. If he continues to have progression of the mediastinal lymphadenopathy, I would consider referring the patient to thoracic surgery for consideration of mediastinoscopy and tissue diagnosis. He was advised to call immediately  if he has any concerning symptoms in the interval. The patient voices understanding of current disease status and treatment options and is in agreement with the current care plan.  All questions were answered. The patient knows to call the clinic with any problems, questions or concerns. We can certainly see the patient much sooner if necessary.  I spent 20 minutes counseling the patient face to face. The total time spent in the appointment  was 30 minutes.  Disclaimer: This note was dictated with voice recognition software. Similar sounding words can inadvertently be transcribed and may not be corrected upon review.

## 2013-08-07 ENCOUNTER — Ambulatory Visit (INDEPENDENT_AMBULATORY_CARE_PROVIDER_SITE_OTHER): Payer: Commercial Managed Care - HMO | Admitting: Cardiology

## 2013-08-07 ENCOUNTER — Encounter: Payer: Self-pay | Admitting: Cardiology

## 2013-08-07 VITALS — BP 144/90 | HR 92 | Ht 71.0 in | Wt 331.0 lb

## 2013-08-07 DIAGNOSIS — I4891 Unspecified atrial fibrillation: Secondary | ICD-10-CM | POA: Diagnosis not present

## 2013-08-07 NOTE — Patient Instructions (Signed)
The current medical regimen is effective;  continue present plan and medications.  Follow up in 1 year with Dr Hochrein.  You will receive a letter in the mail 2 months before you are due.  Please call us when you receive this letter to schedule your follow up appointment.  

## 2013-08-07 NOTE — Progress Notes (Signed)
HPI Patient presents for routine followup. Since I last saw him he has had no new cardiovascular complaints. The patient denies any new symptoms such as chest discomfort, neck or arm discomfort.  He does not notice his fibrillation. . There has been no new shortness of breath, PND or orthopnea. There have been no reported palpitations, presyncope or syncope.    He is active working in the yard.    Allergies  Allergen Reactions  . Avapro [Irbesartan]   . Lipitor [Atorvastatin Calcium] Other (See Comments)    Leg pain    Current Outpatient Prescriptions  Medication Sig Dispense Refill  . albuterol (PROAIR HFA) 108 (90 BASE) MCG/ACT inhaler Inhale 2 puffs into the lungs every 6 (six) hours as needed for wheezing or shortness of breath.  1 Inhaler  prn  . ALPRAZolam (XANAX) 0.25 MG tablet Take 1 tablet (0.25 mg total) by mouth at bedtime as needed.  30 tablet  2  . Cholecalciferol (VITAMIN D) 1000 UNITS capsule Take 5,000 Units by mouth daily.      . enalapril (VASOTEC) 20 MG tablet TAKE ONE TABLET BY MOUTH ONCE DAILY  30 tablet  6  . insulin lispro protamine-lispro (HUMALOG MIX 50/50) (50-50) 100 UNIT/ML SUSP injection Inject 65 Units into the skin 2 (two) times daily before a meal.  40 mL  11  . ipratropium-albuterol (DUONEB) 0.5-2.5 (3) MG/3ML SOLN Take 3 mLs by nebulization every 6 (six) hours as needed.  75 mL  prn  . metFORMIN (GLUCOPHAGE) 500 MG tablet TAKE ONE TABLET BY MOUTH IN THE MORNING AND TWO IN THE EVENING  270 tablet  3  . metoprolol (LOPRESSOR) 100 MG tablet Take 1 tablet (100 mg total) by mouth 2 (two) times daily.  180 tablet  3  . warfarin (COUMADIN) 5 MG tablet 7.5mg  Monday and Friday:  5mg  the rest of the wek.  45 tablet  6  . Blood Glucose Monitoring Suppl (CLEVER CHEK AUTO-CODE VOICE) DEVI USE AS DIRECTED  1 each  2  . CLEVER CHEK AUTO-CODE VOICE test strip USE TWICE DAILY AS DIRECTED FOR GLUCOSE TESTING AND MONITORING  200 each  2  . Lancet Devices (LANCING DEVICE)  MISC USE AS DIRECTED  1 each  2  . simvastatin (ZOCOR) 80 MG tablet Take 80 mg by mouth. Take 1/2 tablet daily      . [DISCONTINUED] digoxin (LANOXIN) 0.25 MG tablet Take 250 mcg by mouth daily.        No current facility-administered medications for this visit.    Past Medical History  Diagnosis Date  . Diabetes mellitus   . Asthma   . Hyperlipidemia   . Obesity     exogenous  . Impotence   . Wheezing   . Left knee pain   . BPH (benign prostatic hypertrophy)   . Colon polyps   . Diabetes mellitus  type 2   . COPD (chronic obstructive pulmonary disease)   . A-fib   . Shingles   . AR (allergic rhinitis)   . GERD (gastroesophageal reflux disease)   . Hx of colonic polyps   . Arthritis   . Asthma   . HTN (hypertension)     x 3 years  . Pneumonia   . Cancer   . NHL (non-Hodgkin's lymphoma)     nhl dx 9/11  . Prostate cancer     pt states no prostate ca  . B-cell lymphoma   . Non Hodgkin's lymphoma b cell  .  Lung nodules 03/11/2013  . Mediastinal adenopathy 06/20/2013    CT  & PET 2/15     Past Surgical History  Procedure Laterality Date  . Appendectomy      ROS:  As stated in the HPI and negative for all other systems.  PHYSICAL EXAM BP 144/90  Pulse 92  Ht 5\' 11"  (1.803 m)  Wt 331 lb (150.141 kg)  BMI 46.19 kg/m2 GENERAL:  Well appearing HEENT:  Pupils equal round and reactive, fundi not visualized, oral mucosa unremarkable NECK:  No jugular venous distention, waveform within normal limits, carotid upstroke brisk and symmetric, no bruits, no thyromegaly LUNGS:  Clear to auscultation bilaterally BACK:  No CVA tenderness CHEST:  Unremarkable HEART:  PMI not displaced or sustained,S1 and S2 within normal limits, no S3, no clicks, no rubs, no murmurs, irregular ABD:  Flat, positive bowel sounds normal in frequency in pitch, no bruits, no rebound, no guarding, no midline pulsatile mass, no hepatomegaly, no splenomegaly, obese EXT:  2 plus pulses throughout, no  edema, no cyanosis no clubbing   EKG:  Atrial fibrillation rate 72, axis within normal limits, intervals within normal limits, diffuse T wave flattening.  08/07/2013   ASSESSMENT AND PLAN  FIBRILLATION, ATRIAL -  He has no symptomatic paroxysms. He tolerates anticoagulation. The patient is not interested in switching to a novel oral anticoagulant.    Essential hypertension, benign -  The blood pressure is at target. No change in medications is indicated. We will continue with therapeutic lifestyle changes (TLC).  OBESITY, UNSPECIFIED -  We have discussed strategies for weight loss.

## 2013-08-09 ENCOUNTER — Other Ambulatory Visit: Payer: Self-pay | Admitting: *Deleted

## 2013-08-09 MED ORDER — ALPRAZOLAM 0.25 MG PO TABS: 0.2500 mg | ORAL_TABLET | Freq: Every evening | ORAL | Status: DC | PRN

## 2013-08-09 NOTE — Telephone Encounter (Signed)
This is okay to refill 

## 2013-08-09 NOTE — Telephone Encounter (Signed)
Patient last seen in office on 05-01-13. Rx last filled on 07-02-13. Please advise. If approved please route to Pool A so nurse can phone in to [facility] in [place]

## 2013-08-12 ENCOUNTER — Other Ambulatory Visit: Payer: Self-pay | Admitting: Family Medicine

## 2013-08-12 NOTE — Telephone Encounter (Signed)
Left refill authorization voicemail

## 2013-08-19 ENCOUNTER — Telehealth: Payer: Self-pay | Admitting: Internal Medicine

## 2013-08-19 NOTE — Telephone Encounter (Signed)
s.w. pt adn r/s pt appt due to MD out of the office....pt ok and aware of new d.t

## 2013-08-22 ENCOUNTER — Encounter: Payer: Self-pay | Admitting: Family Medicine

## 2013-08-22 ENCOUNTER — Ambulatory Visit (INDEPENDENT_AMBULATORY_CARE_PROVIDER_SITE_OTHER): Payer: Medicare HMO | Admitting: Family Medicine

## 2013-08-22 VITALS — BP 148/92 | HR 64 | Temp 96.7°F | Ht 71.0 in | Wt 326.0 lb

## 2013-08-22 DIAGNOSIS — I4891 Unspecified atrial fibrillation: Secondary | ICD-10-CM

## 2013-08-22 DIAGNOSIS — E559 Vitamin D deficiency, unspecified: Secondary | ICD-10-CM

## 2013-08-22 DIAGNOSIS — E119 Type 2 diabetes mellitus without complications: Secondary | ICD-10-CM

## 2013-08-22 DIAGNOSIS — I10 Essential (primary) hypertension: Secondary | ICD-10-CM

## 2013-08-22 DIAGNOSIS — K219 Gastro-esophageal reflux disease without esophagitis: Secondary | ICD-10-CM

## 2013-08-22 DIAGNOSIS — J441 Chronic obstructive pulmonary disease with (acute) exacerbation: Secondary | ICD-10-CM

## 2013-08-22 DIAGNOSIS — Z87898 Personal history of other specified conditions: Secondary | ICD-10-CM

## 2013-08-22 DIAGNOSIS — C8589 Other specified types of non-Hodgkin lymphoma, extranodal and solid organ sites: Secondary | ICD-10-CM

## 2013-08-22 DIAGNOSIS — E785 Hyperlipidemia, unspecified: Secondary | ICD-10-CM

## 2013-08-22 DIAGNOSIS — Z7901 Long term (current) use of anticoagulants: Secondary | ICD-10-CM

## 2013-08-22 LAB — POCT INR: INR: 3.1

## 2013-08-22 LAB — POCT GLYCOSYLATED HEMOGLOBIN (HGB A1C): HEMOGLOBIN A1C: 7.4

## 2013-08-22 NOTE — Patient Instructions (Addendum)
Medicare Annual Wellness Visit  [facility] and the medical providers at [facility] strive to bring you the best medical care.  In doing so we not only want to address your current medical conditions and concerns but also to detect new conditions early and prevent illness, disease and health-related problems.    Medicare offers a yearly Wellness Visit which allows our clinical staff to assess your need for preventative services including immunizations, lifestyle education, counseling to decrease risk of preventable diseases and screening for fall risk and other medical concerns.    This visit is provided free of charge (no copay) for all Medicare recipients. The clinical pharmacists at [facility] have begun to conduct these Wellness Visits which will also include a thorough review of all your medications.    As you primary medical provider recommend that you make an appointment for your Annual Wellness Visit if you have not done so already this year.  You may set up this appointment before you leave today or you may call back ([phone]) and schedule an appointment.  Please make sure when you call that you mention that you are scheduling your Annual Wellness Visit with the clinical pharmacist so that the appointment may be made for the proper length of time.       Continue current medications. Continue good therapeutic lifestyle changes which include good diet and exercise. Fall precautions discussed with patient. If an FOBT was given today- please return it to our front desk. If you are over 34 years old - you may need Prevnar 80 or the adult Pneumonia vaccine.  Continue to monitor blood sugars and blood pressures regularly Try to get as much exercise as possible Drink plenty of fluids especially water We will call you with the lab work results once those results are available  Be sure and check with your insurance regarding  the Prevnar vaccine Continue to follow up with the hematologist and the pulmonologist as planned Watch sodium intake  Anticoagulation Dose Instructions as of 08/22/2013     Dorene Grebe Tue Wed Thu Fri Sat   New Dose 5 mg 7.5 mg 5 mg 5 mg 5 mg 7.5 mg 5 mg    Description       Hold warfarin for 1 days, then continue 7.5mg  mondays and fridays and 5mg  all other days.      INR was 3.1 today

## 2013-08-22 NOTE — Addendum Note (Signed)
Addended by: Pollyann Kennedy F on: 08/22/2013 12:46 PM   Modules accepted: Orders

## 2013-08-22 NOTE — Progress Notes (Signed)
Subjective:    Patient ID: Justin Ruiz, male    DOB: 1950-07-31, 63 y.o.   MRN: [id number]  HPI Pt here for follow up and management of chronic medical problems. Patient comes in today with no specific complaints. He is still being followed by the oncologist for his lymphoma. He is scheduled for a CT scan in May. He does bring his blood sugars in for review and they are running fasting from 125 to 285. During the day the sugars range from 119 to  as high as 215. The patient has not been checking his blood pressures regularly.        Patient Active Problem List   Diagnosis Date Noted  . Pulmonary nodules 07/15/2013  . Mediastinal adenopathy 06/20/2013  . Severe obesity (BMI >= 40) 05/01/2013  . Generalized anxiety disorder 05/01/2013  . Lung nodules 03/11/2013  . Chronic anticoagulation 08/20/2012  . FIBRILLATION, ATRIAL 05/12/2010  . LYMPHOMA 01/26/2010  . OBESITY, UNSPECIFIED 04/27/2009  . CARDIOVASCULAR STUDIES, ABNORMAL 04/27/2009  . ABNORMAL STRESS ELECTROCARDIOGRAM 03/25/2009  . PERSONAL HISTORY OF COLONIC POLYPS 03/02/2009  . DIABETES, TYPE 2 04/29/2007  . HYPERLIPIDEMIA 04/29/2007  . Hypertension 04/29/2007  . Seasonal and perennial allergic rhinitis 04/29/2007  . COPD exacerbation 04/29/2007  . G E R D 04/29/2007  . BENIGN PROSTATIC HYPERTROPHY, HX OF 04/29/2007   Outpatient Encounter Prescriptions as of 08/22/2013  Medication Sig  . albuterol (PROAIR HFA) 108 (90 BASE) MCG/ACT inhaler Inhale 2 puffs into the lungs every 6 (six) hours as needed for wheezing or shortness of breath.  . ALPRAZolam (XANAX) 0.25 MG tablet Take 1 tablet (0.25 mg total) by mouth at bedtime as needed.  . Blood Glucose Monitoring Suppl (CLEVER CHEK AUTO-CODE VOICE) DEVI USE AS DIRECTED  . Cholecalciferol (VITAMIN D) 1000 UNITS capsule Take 5,000 Units by mouth daily.  Marland Kitchen CLEVER CHEK AUTO-CODE VOICE test strip USE TWICE DAILY AS DIRECTED FOR GLUCOSE TESTING AND MONITORING  . enalapril  (VASOTEC) 20 MG tablet TAKE ONE TABLET BY MOUTH ONCE DAILY  . insulin lispro protamine-lispro (HUMALOG MIX 50/50) (50-50) 100 UNIT/ML SUSP injection Inject 62 Units into the skin 2 (two) times daily before a meal.   . ipratropium-albuterol (DUONEB) 0.5-2.5 (3) MG/3ML SOLN Take 3 mLs by nebulization every 6 (six) hours as needed.  Elmore Guise Devices (LANCING DEVICE) MISC USE AS DIRECTED  . metFORMIN (GLUCOPHAGE) 500 MG tablet TAKE ONE TABLET BY MOUTH IN THE MORNING AND TWO IN THE EVENING  . metoprolol (LOPRESSOR) 100 MG tablet Take 1 tablet (100 mg total) by mouth 2 (two) times daily.  . simvastatin (ZOCOR) 80 MG tablet Take 80 mg by mouth.   . warfarin (COUMADIN) 5 MG tablet 7.69m Monday and Friday:  563mthe rest of the wek.    Review of Systems  Constitutional: Negative.   HENT: Negative.   Eyes: Negative.   Respiratory: Negative.   Cardiovascular: Negative.   Gastrointestinal: Negative.   Endocrine: Negative.   Genitourinary: Negative.   Musculoskeletal: Negative.   Skin: Negative.   Allergic/Immunologic: Negative.   Neurological: Negative.   Hematological: Negative.   Psychiatric/Behavioral: Negative.        Objective:   Physical Exam  Nursing note and vitals reviewed. Constitutional: He is oriented to person, place, and time. He appears well-developed and well-nourished. No distress.  Somewhat pale in color and very overweight  HENT:  Head: Normocephalic and atraumatic.  Right Ear: External ear normal.  Left Ear: External ear normal.  Nose: Nose  normal.  Mouth/Throat: Oropharynx is clear and moist. No oropharyngeal exudate.  Dentures in place  Eyes: Conjunctivae and EOM are normal. Pupils are equal, round, and reactive to light. Right eye exhibits no discharge. Left eye exhibits no discharge. No scleral icterus.  Neck: Normal range of motion. Neck supple. No thyromegaly present.  No carotid bruits or cervical adenopathy  Cardiovascular: Normal rate, normal heart sounds  and intact distal pulses.  Exam reveals no gallop and no friction rub.   No murmur heard. The heart is irregular irregular at 72 per minute  Pulmonary/Chest: Effort normal. No respiratory distress. He has wheezes. He has no rales. He exhibits no tenderness.  Scattered sparse wheezes left greater than right and somewhat tight cough  Abdominal: Soft. Bowel sounds are normal. He exhibits no mass. There is no tenderness. There is no rebound and no guarding.  Severe obesity without tenderness or masses  Musculoskeletal: Normal range of motion. He exhibits no edema and no tenderness.  Lymphadenopathy:    He has no cervical adenopathy.  Neurological: He is alert and oriented to person, place, and time. He has normal reflexes. No cranial nerve deficit.  Skin: Skin is warm and dry. No rash noted. No erythema. There is pallor.  Psychiatric: He has a normal mood and affect. His behavior is normal. Judgment and thought content normal.   BP 148/92  Pulse 64  Temp(Src) 96.7 F (35.9 C) (Oral)  Ht '5\' 11"'  (1.803 m)  Wt 326 lb (147.873 kg)  BMI 45.49 kg/m2        Assessment & Plan:  1. BENIGN PROSTATIC HYPERTROPHY, HX OF - POCT CBC  2. COPD exacerbation - POCT CBC -Rare respiratory protection and use inhalers regularly  3. DIABETES, TYPE 2 - POCT CBC - POCT glycosylated hemoglobin (Hb A1C)  4. FIBRILLATION, ATRIAL - POCT CBC - POCT INR; Standing - POCT INR  5. G E R D - POCT CBC  6. HYPERLIPIDEMIA - POCT CBC - NMR, lipoprofile  7. Hypertension - POCT CBC - BMP8+EGFR - Hepatic function panel  8. Vitamin D deficiency - Vit D  25 hydroxy (rtn osteoporosis monitoring)  9. Other malignant lymphomas, unspecified site, extranodal and solid organ sites -Continue to followup with hematologist and pulmonologist -CT scan planned for May Patient Instructions                       Medicare Annual Wellness Visit  [facility] and the medical providers at [facility] strive to bring you the best medical care.  In doing so we not only want to address your current medical conditions and concerns but also to detect new conditions early and prevent illness, disease and health-related problems.    Medicare offers a yearly Wellness Visit which allows our clinical staff to assess your need for preventative services including immunizations, lifestyle education, counseling to decrease risk of preventable diseases and screening for fall risk and other medical concerns.    This visit is provided free of charge (no copay) for all Medicare recipients. The clinical pharmacists at [facility] have begun to conduct these Wellness Visits which will also include a thorough review of all your medications.    As you primary medical provider recommend that you make an appointment for your Annual Wellness Visit if you have not done so already this year.  You may set up this appointment before you leave today or you may call back ([phone]) and schedule  an appointment.  Please make sure when you call that you mention that you are scheduling your Annual Wellness Visit with the clinical pharmacist so that the appointment may be made for the proper length of time.       Continue current medications. Continue good therapeutic lifestyle changes which include good diet and exercise. Fall precautions discussed with patient. If an FOBT was given today- please return it to our front desk. If you are over 46 years old - you may need Prevnar 53 or the adult Pneumonia vaccine.  Continue to monitor blood sugars and blood pressures regularly Try to get as much exercise as possible Drink plenty of fluids especially water We will call you with the lab work results once those results are available  Be sure and check with your insurance regarding the Prevnar vaccine Continue to follow up with the hematologist and the pulmonologist as planned Watch sodium  intake   Arrie Senate MD

## 2013-08-23 LAB — NMR, LIPOPROFILE
Cholesterol: 175 mg/dL (ref <200)
HDL CHOLESTEROL BY NMR: 38 mg/dL — AB (ref >=40)
HDL Particle Number: 34.4 umol/L (ref >=30.5)
LDL PARTICLE NUMBER: 1187 nmol/L — AB (ref <1000)
LDL Size: 19.9 nm (ref >20.5)
LDLC SERPL CALC-MCNC: 61 mg/dL (ref <100)
LP-IR Score: 79 — ABNORMAL HIGH (ref <=45)
SMALL LDL PARTICLE NUMBER: 795 nmol/L — AB (ref <=527)
Triglycerides by NMR: 379 mg/dL — ABNORMAL HIGH (ref <150)

## 2013-08-23 LAB — BMP8+EGFR
BUN/Creatinine Ratio: 26 — ABNORMAL HIGH (ref 10–22)
BUN: 21 mg/dL (ref 8–27)
CALCIUM: 9.4 mg/dL (ref 8.6–10.2)
CHLORIDE: 96 mmol/L — AB (ref 97–108)
CO2: 29 mmol/L (ref 18–29)
Creatinine, Ser: 0.81 mg/dL (ref 0.76–1.27)
GFR calc Af Amer: 110 mL/min/{1.73_m2} (ref >59)
GFR calc non Af Amer: 95 mL/min/{1.73_m2} (ref >59)
Glucose: 216 mg/dL — ABNORMAL HIGH (ref 65–99)
Potassium: 4.9 mmol/L (ref 3.5–5.2)
SODIUM: 138 mmol/L (ref 134–144)

## 2013-08-23 LAB — HEPATIC FUNCTION PANEL
ALT: 15 IU/L (ref 0–44)
AST: 16 IU/L (ref 0–40)
Albumin: 4 g/dL (ref 3.6–4.8)
Alkaline Phosphatase: 58 IU/L (ref 39–117)
BILIRUBIN TOTAL: 1.1 mg/dL (ref 0.0–1.2)
Bilirubin, Direct: 0.23 mg/dL (ref 0.00–0.40)
Total Protein: 6.4 g/dL (ref 6.0–8.5)

## 2013-08-23 LAB — CBC WITH DIFFERENTIAL
BASOS ABS: 0 10*3/uL (ref 0.0–0.2)
Basos: 0 %
EOS ABS: 0.1 10*3/uL (ref 0.0–0.4)
Eos: 2 %
HCT: 45.8 % (ref 37.5–51.0)
HEMOGLOBIN: 14.8 g/dL (ref 12.6–17.7)
Immature Grans (Abs): 0 10*3/uL (ref 0.0–0.1)
Immature Granulocytes: 0 %
Lymphocytes Absolute: 1.7 10*3/uL (ref 0.7–3.1)
Lymphs: 30 %
MCH: 30.5 pg (ref 26.6–33.0)
MCHC: 32.3 g/dL (ref 31.5–35.7)
MCV: 94 fL (ref 79–97)
MONOCYTES: 8 %
Monocytes Absolute: 0.5 10*3/uL (ref 0.1–0.9)
NEUTROS PCT: 60 %
Neutrophils Absolute: 3.4 10*3/uL (ref 1.4–7.0)
Platelets: 167 10*3/uL (ref 150–379)
RBC: 4.86 x10E6/uL (ref 4.14–5.80)
RDW: 14.4 % (ref 12.3–15.4)
WBC: 5.7 10*3/uL (ref 3.4–10.8)

## 2013-08-23 LAB — VITAMIN D 25 HYDROXY (VIT D DEFICIENCY, FRACTURES): VIT D 25 HYDROXY: 30.6 ng/mL (ref 30.0–100.0)

## 2013-09-02 ENCOUNTER — Telehealth: Payer: Self-pay | Admitting: Family Medicine

## 2013-09-02 ENCOUNTER — Ambulatory Visit (INDEPENDENT_AMBULATORY_CARE_PROVIDER_SITE_OTHER): Payer: Medicare HMO | Admitting: Pharmacist

## 2013-09-02 DIAGNOSIS — I4891 Unspecified atrial fibrillation: Secondary | ICD-10-CM

## 2013-09-02 DIAGNOSIS — Z7901 Long term (current) use of anticoagulants: Secondary | ICD-10-CM

## 2013-09-02 LAB — POCT INR: INR: 2

## 2013-09-02 MED ORDER — INSULIN NPH (HUMAN) (ISOPHANE) 100 UNIT/ML ~~LOC~~ SUSP: 32.0000 [IU] | Freq: Two times a day (BID) | SUBCUTANEOUS | Status: DC

## 2013-09-02 MED ORDER — INSULIN REGULAR HUMAN 100 UNIT/ML IJ SOLN: 32.0000 [IU] | Freq: Two times a day (BID) | INTRAMUSCULAR | Status: DC

## 2013-09-02 NOTE — Patient Instructions (Addendum)
Change to Relion Novolin R  - Inject 32 units prior to breakfast and supper (draw up this insulin in syringe first)  Also change to Relion Novolin N - inject 32 units prior to breakfast and supper (draw up this insulin second)  Will inject total of 64 units prior to breakfast and 64 units prior to supper   Anticoagulation Dose Instructions as of 09/02/2013     Justin Ruiz Tue Wed Thu Fri Sat   New Dose 5 mg 7.5 mg 5 mg 5 mg 5 mg 7.5 mg 5 mg    Description       Continue 7.5mg  mondays and fridays and 5mg  all other days.      INR was 2.0 today

## 2013-09-02 NOTE — Telephone Encounter (Signed)
rx left at front desk and patient called

## 2013-09-02 NOTE — Progress Notes (Signed)
Patient also asked to changed to cheaper insulin.  He received communication from [employer] about changing to [place] and R insulin.  He is currently taking humalog 50/50 mix 62 units bid before breakfast and supper. Lowest HBG reading 97, usually in 150's to 170's  Change Relion Novolin N to 32 units twice a day prior to breakfast and supper and Relion Novolin R 32 units twice a day prior to breakfast and supper

## 2013-09-06 ENCOUNTER — Ambulatory Visit (HOSPITAL_COMMUNITY)
Admission: RE | Admit: 2013-09-06 | Discharge: 2013-09-06 | Disposition: A | Discharge status: Home / Self Care | Payer: Medicare HMO | Source: Ambulatory Visit | Attending: Internal Medicine | Admitting: Internal Medicine

## 2013-09-06 ENCOUNTER — Encounter (HOSPITAL_COMMUNITY): Payer: Self-pay

## 2013-09-06 ENCOUNTER — Other Ambulatory Visit: Payer: Commercial Managed Care - HMO

## 2013-09-06 DIAGNOSIS — K5710 Diverticulosis of small intestine without perforation or abscess without bleeding: Secondary | ICD-10-CM | POA: Insufficient documentation

## 2013-09-06 DIAGNOSIS — K4020 Bilateral inguinal hernia, without obstruction or gangrene, not specified as recurrent: Secondary | ICD-10-CM | POA: Insufficient documentation

## 2013-09-06 DIAGNOSIS — I700 Atherosclerosis of aorta: Secondary | ICD-10-CM | POA: Insufficient documentation

## 2013-09-06 DIAGNOSIS — E119 Type 2 diabetes mellitus without complications: Secondary | ICD-10-CM | POA: Diagnosis not present

## 2013-09-06 DIAGNOSIS — I2510 Atherosclerotic heart disease of native coronary artery without angina pectoris: Secondary | ICD-10-CM | POA: Insufficient documentation

## 2013-09-06 DIAGNOSIS — R918 Other nonspecific abnormal finding of lung field: Secondary | ICD-10-CM | POA: Diagnosis not present

## 2013-09-06 DIAGNOSIS — C8589 Other specified types of non-Hodgkin lymphoma, extranodal and solid organ sites: Secondary | ICD-10-CM

## 2013-09-06 DIAGNOSIS — I10 Essential (primary) hypertension: Secondary | ICD-10-CM | POA: Diagnosis not present

## 2013-09-06 LAB — COMPREHENSIVE METABOLIC PANEL (CC13)
ALBUMIN: 3.2 g/dL — AB (ref 3.5–5.0)
ALK PHOS: 59 U/L (ref 40–150)
ALT: 14 U/L (ref 0–55)
AST: 13 U/L (ref 5–34)
Anion Gap: 12 mEq/L — ABNORMAL HIGH (ref 3–11)
BUN: 22.2 mg/dL (ref 7.0–26.0)
CO2: 25 mEq/L (ref 22–29)
Calcium: 10.1 mg/dL (ref 8.4–10.4)
Chloride: 103 mEq/L (ref 98–109)
Creatinine: 0.9 mg/dL (ref 0.7–1.3)
Glucose: 191 mg/dl — ABNORMAL HIGH (ref 70–140)
POTASSIUM: 4.7 meq/L (ref 3.5–5.1)
SODIUM: 141 meq/L (ref 136–145)
TOTAL PROTEIN: 6.8 g/dL (ref 6.4–8.3)
Total Bilirubin: 0.57 mg/dL (ref 0.20–1.20)

## 2013-09-06 LAB — CBC WITH DIFFERENTIAL/PLATELET
BASO%: 1 % (ref 0.0–2.0)
Basophils Absolute: 0.1 10*3/uL (ref 0.0–0.1)
EOS%: 1.9 % (ref 0.0–7.0)
Eosinophils Absolute: 0.1 10*3/uL (ref 0.0–0.5)
HCT: 44 % (ref 38.4–49.9)
HGB: 14.4 g/dL (ref 13.0–17.1)
LYMPH#: 1.6 10*3/uL (ref 0.9–3.3)
LYMPH%: 26.1 % (ref 14.0–49.0)
MCH: 30.7 pg (ref 27.2–33.4)
MCHC: 32.7 g/dL (ref 32.0–36.0)
MCV: 93.7 fL (ref 79.3–98.0)
MONO#: 0.4 10*3/uL (ref 0.1–0.9)
MONO%: 6.1 % (ref 0.0–14.0)
NEUT#: 4.1 10*3/uL (ref 1.5–6.5)
NEUT%: 64.9 % (ref 39.0–75.0)
Platelets: 193 10*3/uL (ref 140–400)
RBC: 4.7 10*6/uL (ref 4.20–5.82)
RDW: 14.1 % (ref 11.0–14.6)
WBC: 6.3 10*3/uL (ref 4.0–10.3)

## 2013-09-06 LAB — LACTATE DEHYDROGENASE (CC13): LDH: 179 U/L (ref 125–245)

## 2013-09-06 MED ORDER — IOHEXOL 300 MG/ML  SOLN
100.0000 mL | Freq: Once | INTRAMUSCULAR | Status: AC | PRN
  Administered 2013-09-06: 100 mL via INTRAVENOUS

## 2013-09-12 ENCOUNTER — Ambulatory Visit: Payer: Medicare PPO | Admitting: Internal Medicine

## 2013-09-18 ENCOUNTER — Telehealth: Payer: Self-pay | Admitting: Internal Medicine

## 2013-09-18 ENCOUNTER — Ambulatory Visit (HOSPITAL_BASED_OUTPATIENT_CLINIC_OR_DEPARTMENT_OTHER): Payer: Medicare HMO | Admitting: Internal Medicine

## 2013-09-18 ENCOUNTER — Encounter: Payer: Self-pay | Admitting: Internal Medicine

## 2013-09-18 VITALS — BP 148/85 | HR 80 | Temp 98.8°F | Resp 18 | Ht 71.0 in | Wt 331.0 lb

## 2013-09-18 DIAGNOSIS — R0609 Other forms of dyspnea: Secondary | ICD-10-CM

## 2013-09-18 DIAGNOSIS — R599 Enlarged lymph nodes, unspecified: Secondary | ICD-10-CM

## 2013-09-18 DIAGNOSIS — R0989 Other specified symptoms and signs involving the circulatory and respiratory systems: Secondary | ICD-10-CM

## 2013-09-18 DIAGNOSIS — C8589 Other specified types of non-Hodgkin lymphoma, extranodal and solid organ sites: Secondary | ICD-10-CM

## 2013-09-18 NOTE — Progress Notes (Signed)
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 2) hypermetabolic mediastinal lymphadenopathy.  PRIOR THERAPY: Status post systemic chemotherapy with 6 cycles of infusional CHOPE with Rituxan.  CURRENT THERAPY: Observation  INTERVAL HISTORY: Justin Ruiz 63 y.o. male returns to the clinic today for followup visit accompanied by his wife. The patient is feeling well today with no specific complaints except for mild fatigue. He denied having any significant weight loss or night sweats. He has no palpable lymphadenopathy. He has no bleeding issues. He has no chest pain but continues to have shortness breath with exertion with no cough or hemoptysis. He had repeat CT scan of the chest, abdomen and pelvis performed recently and he is here for evaluation and discussion of his scan results.  MEDICAL HISTORY: Past Medical History  Diagnosis Date  . Diabetes mellitus   . Asthma   . Hyperlipidemia   . Obesity     exogenous  . Impotence   . Wheezing   . Left knee pain   . BPH (benign prostatic hypertrophy)   . Colon polyps   . Diabetes mellitus  type 2   . COPD (chronic obstructive pulmonary disease)   . A-fib   . Shingles   . AR (allergic rhinitis)   . GERD (gastroesophageal reflux disease)   . Hx of colonic polyps   . Arthritis   . Asthma   . HTN (hypertension)     x 3 years  . Pneumonia   . Cancer   . NHL (non-Hodgkin's lymphoma)     nhl dx 9/11  . Prostate cancer     pt states no prostate ca  . B-cell lymphoma   . Non Hodgkin's lymphoma b cell  . Lung nodules 03/11/2013  . Mediastinal adenopathy 06/20/2013    CT  & PET 2/15     ALLERGIES:  is allergic to avapro and lipitor.  MEDICATIONS:  Current Outpatient Prescriptions  Medication Sig Dispense Refill  .  albuterol (PROAIR HFA) 108 (90 BASE) MCG/ACT inhaler Inhale 2 puffs into the lungs every 6 (six) hours as needed for wheezing or shortness of breath.  1 Inhaler  prn  . ALPRAZolam (XANAX) 0.25 MG tablet Take 1 tablet (0.25 mg total) by mouth at bedtime as needed.  30 tablet  2  . Blood Glucose Monitoring Suppl (CLEVER CHEK AUTO-CODE VOICE) DEVI USE AS DIRECTED  1 each  2  . Cholecalciferol (VITAMIN D) 1000 UNITS capsule Take 5,000 Units by mouth daily.      Marland Kitchen CLEVER CHEK AUTO-CODE VOICE test strip USE TWICE DAILY AS DIRECTED FOR GLUCOSE TESTING AND MONITORING  200 each  2  . enalapril (VASOTEC) 20 MG tablet TAKE ONE TABLET BY MOUTH ONCE DAILY  30 tablet  6  . insulin NPH Human (NOVOLIN N RELION) 100 UNIT/ML injection Inject 0.32 mLs (32 Units total) into the skin 2 (two) times daily before a meal.  20 mL  3  . insulin regular (NOVOLIN R,HUMULIN R) 100 units/mL injection Inject 0.32 mLs (32 Units total) into the skin 2 (two) times daily before a meal.  20 mL  3  . ipratropium-albuterol (DUONEB) 0.5-2.5 (3) MG/3ML SOLN Take 3 mLs by nebulization every 6 (six) hours as needed.  75 mL  prn  . Lancet Devices (LANCING DEVICE) MISC  USE AS DIRECTED  1 each  2  . metFORMIN (GLUCOPHAGE) 500 MG tablet TAKE ONE TABLET BY MOUTH IN THE MORNING AND TWO IN THE EVENING  270 tablet  3  . metoprolol (LOPRESSOR) 100 MG tablet Take 1 tablet (100 mg total) by mouth 2 (two) times daily.  180 tablet  3  . simvastatin (ZOCOR) 80 MG tablet Take 80 mg by mouth.       . warfarin (COUMADIN) 5 MG tablet 7.5mg  Monday and Friday:  5mg  the rest of the wek.  45 tablet  6  . [DISCONTINUED] digoxin (LANOXIN) 0.25 MG tablet Take 250 mcg by mouth daily.        No current facility-administered medications for this visit.    SURGICAL HISTORY:  Past Surgical History  Procedure Laterality Date  . Appendectomy      REVIEW OF SYSTEMS:  Constitutional: negative Eyes: negative Ears, nose, mouth, throat, and face:  negative Respiratory: positive for dyspnea on exertion Cardiovascular: negative Gastrointestinal: negative Genitourinary:negative Integument/breast: negative Hematologic/lymphatic: negative Musculoskeletal:negative Neurological: negative Behavioral/Psych: negative Endocrine: negative Allergic/Immunologic: negative   PHYSICAL EXAMINATION: General appearance: alert, cooperative and no distress Head: Normocephalic, without obvious abnormality, atraumatic Neck: no adenopathy, no JVD, supple, symmetrical, trachea midline and thyroid not enlarged, symmetric, no tenderness/mass/nodules Lymph nodes: Cervical, supraclavicular, and axillary nodes normal. Resp: clear to auscultation bilaterally Back: symmetric, no curvature. ROM normal. No CVA tenderness. Cardio: regular rate and rhythm, S1, S2 normal, no murmur, click, rub or gallop GI: soft, non-tender; bowel sounds normal; no masses,  no organomegaly Extremities: extremities normal, atraumatic, no cyanosis or edema Neurologic: Alert and oriented X 3, normal strength and tone. Normal symmetric reflexes. Normal coordination and gait  ECOG PERFORMANCE STATUS: 1 - Symptomatic but completely ambulatory  Blood pressure 148/85, pulse 80, temperature 98.8 F (37.1 C), temperature source Oral, resp. rate 18, height 5\' 11"  (1.803 m), weight 331 lb (150.141 kg).  LABORATORY DATA: Lab Results  Component Value Date   WBC 6.3 09/06/2013   HGB 14.4 09/06/2013   HCT 44.0 09/06/2013   MCV 93.7 09/06/2013   PLT 193 09/06/2013      Chemistry      Component Value Date/Time   NA 141 09/06/2013 0800   NA 138 08/22/2013 0931   NA 141 11/14/2012 0918   NA 136 11/01/2011 0753   K 4.7 09/06/2013 0800   K 4.9 08/22/2013 0931   K 4.8* 11/01/2011 0753   CL 96* 08/22/2013 0931   CL 100 09/03/2012 0751   CL 91* 11/01/2011 0753   CO2 25 09/06/2013 0800   CO2 29 08/22/2013 0931   CO2 34* 11/01/2011 0753   BUN 22.2 09/06/2013 0800   BUN 21 08/22/2013 0931   BUN 13 11/14/2012  0918   BUN 18 11/01/2011 0753   CREATININE 0.9 09/06/2013 0800   CREATININE 0.81 08/22/2013 0931   CREATININE 0.82 11/14/2012 0918   CREATININE 0.8 03/29/2012   GLU 185 03/29/2012      Component Value Date/Time   CALCIUM 10.1 09/06/2013 0800   CALCIUM 9.4 08/22/2013 0931   CALCIUM 8.8 11/01/2011 0753   ALKPHOS 59 09/06/2013 0800   ALKPHOS 58 08/22/2013 0931   ALKPHOS 57 11/01/2011 0753   AST 13 09/06/2013 0800   AST 16 08/22/2013 0931   AST 14 11/01/2011 0753   ALT 14 09/06/2013 0800   ALT 15 08/22/2013 0931   ALT 17 11/01/2011 0753   BILITOT 0.57 09/06/2013 0800   BILITOT 1.1 08/22/2013 0931  BILITOT 1.50 11/01/2011 0753       RADIOGRAPHIC STUDIES: Ct Chest W Contrast  09/06/2013   CLINICAL DATA:  Restaging exam for non-Hodgkin's lymphoma.  EXAM: CT CHEST, ABDOMEN, AND PELVIS WITH CONTRAST  TECHNIQUE: Multidetector CT imaging of the chest, abdomen and pelvis was performed following the standard protocol during bolus administration of intravenous contrast.  CONTRAST:  168mL OMNIPAQUE IOHEXOL 300 MG/ML  SOLN  COMPARISON:  PET-CT from 06/19/2013. Chest CT from 06/07/2013. Abdomen and pelvis CT 03/04/2013.  FINDINGS: CT CHEST FINDINGS  Soft tissue / Mediastinum: There is no axillary lymphadenopathy. The 1.3 cm short axis prevascular lymph node which was measured on the previous PET-CT has decreased in size to 0.8 cm. The 9 mm short axis prevascular lymph node which was hypermetabolic on the previous PET-CT has decreased 6 mm in short axis. 1.5 cm right hilar lymph node measured previously is now 0.6 cm. No new or enlarging mediastinal or hilar lymph nodes.  Heart size is normal. Coronary artery calcification is noted. No pericardial effusion.  Lungs / Pleura: As seen on previous studies, there are scattered bilateral pulmonary nodules with a slight right lung predominance. The largest nodule on today's exam is in the right middle lobe and measure 7 mm on image 29 of series 5. This was 4 mm on the recent PET-CT and  about 3 mm on the CT 06/07/2013. 3 mm left lower lobe nodule on image 40 is unchanged. Other scattered pulmonary nodules (see images 33, 34, and 24) are stable.  No pleural effusion.  Bones: Bone windows reveal no worrisome lytic or sclerotic osseous lesions.  CT ABDOMEN AND PELVIS FINDINGS  Liver:  No focal abnormality.  Spleen: Normal.  Stomach: Nondistended with normal CT imaging features.  Pancreas: Normal.  No pancreatic ductal dilatation.  Gallbladder/Biliary Tree: Multiple calcified stones identified, measuring up to 1.3 cm in diameter. No pericholecystic fluid. No intra or extrahepatic biliary duct dilatation.  Kidneys/Adrenals: Mild adrenal thickening bilaterally without a discrete adrenal nodule. No hydronephrosis in either kidney. No renal mass lesion.  Bowel Loops: Small duodenum diverticulum noted. Small bowel otherwise unremarkable without dilatation or wall thickening. Specifically, the terminal ileum is normal. The appendix is not visualized, but there is no edema or inflammation in the region of the cecum. No substantial diverticular disease in the colon. No colonic diverticulitis.  Nodes: No gastrohepatic or hepatoduodenal ligament lymphadenopathy. There are some scattered lymph nodes in the hepatoduodenal ligament but these are not substantially enlarged or changed since the 02/2009 14 exam. Previously identified retroperitoneal lymph node measuring 13 x 25 mm on image 79 today was 15 x 26 mm on 06/19/2013 and 14 x 26 mm on 03/04/2013. No pelvic sidewall lymphadenopathy.  Vasculature: Atherosclerotic calcification is noted in the wall of the abdominal aorta without aneurysm.  Pelvic Genitourinary: Bladder is unremarkable. Prostate gland appears normal size with associated dystrophic calcification, nonspecific.  Bones/Musculoskeletal: Bone windows reveal no worrisome lytic or sclerotic osseous lesions.  Body Wall: Bilateral inguinal hernias contain only fat.  Other: No intraperitoneal free fluid.   IMPRESSION: 1. Mediastinal and right hilar lymphadenopathy identified on the recent PET-CT has decreased in size in the interval since 06/19/2013. 2. Scattered bilateral small pulmonary nodules. 1 of the nodules in the right middle lobe measures minimally larger on today's study but the remaining nodules are all stable. No new pulmonary nodule is evident. 3. Stable small hepatoduodenal ligament lymph nodes. No change in the retroperitoneal aortocaval lymph node measured previously comparing back to  the study from 03/04/2013. No new or progressive nodal disease in the abdomen or pelvis.   Electronically Signed   By: [name] Stanley M.D.   On: 09/06/2013 09:35   Ct Abdomen Pelvis W Contrast  09/06/2013   CLINICAL DATA:  Restaging exam for non-Hodgkin's lymphoma.  EXAM: CT CHEST, ABDOMEN, AND PELVIS WITH CONTRAST  TECHNIQUE: Multidetector CT imaging of the chest, abdomen and pelvis was performed following the standard protocol during bolus administration of intravenous contrast.  CONTRAST:  159mL OMNIPAQUE IOHEXOL 300 MG/ML  SOLN  COMPARISON:  PET-CT from 06/19/2013. Chest CT from 06/07/2013. Abdomen and pelvis CT 03/04/2013.  FINDINGS:   CT CHEST FINDINGS  Soft tissue / Mediastinum: There is no axillary lymphadenopathy. The 1.3 cm short axis prevascular lymph node which was measured on the previous PET-CT has decreased in size to 0.8 cm. The 9 mm short axis prevascular lymph node which was hypermetabolic on the previous PET-CT has decreased 6 mm in short axis. 1.5 cm right hilar lymph node measured previously is now 0.6 cm. No new or enlarging mediastinal or hilar lymph nodes.  Heart size is normal. Coronary artery calcification is noted. No pericardial effusion.  Lungs / Pleura: As seen on previous studies, there are scattered bilateral pulmonary nodules with a slight right lung predominance. The largest nodule on today's exam is in the right middle lobe and measure 7 mm on image 29 of series 5. This was 4 mm on  the recent PET-CT and about 3 mm on the CT 06/07/2013. 3 mm left lower lobe nodule on image 40 is unchanged. Other scattered pulmonary nodules (see images 33, 34, and 24) are stable.  No pleural effusion.  Bones: Bone windows reveal no worrisome lytic or sclerotic osseous lesions.    CT ABDOMEN AND PELVIS FINDINGS  Liver:  No focal abnormality.  Spleen: Normal.  Stomach: Nondistended with normal CT imaging features.  Pancreas: Normal.  No pancreatic ductal dilatation.  Gallbladder/Biliary Tree: Multiple calcified stones identified, measuring up to 1.3 cm in diameter. No pericholecystic fluid. No intra or extrahepatic biliary duct dilatation.  Kidneys/Adrenals: Mild adrenal thickening bilaterally without a discrete adrenal nodule. No hydronephrosis in either kidney. No renal mass lesion.  Bowel Loops: Small duodenum diverticulum noted. Small bowel otherwise unremarkable without dilatation or wall thickening. Specifically, the terminal ileum is normal. The appendix is not visualized, but there is no edema or inflammation in the region of the cecum. No substantial diverticular disease in the colon. No colonic diverticulitis.  Nodes: No gastrohepatic or hepatoduodenal ligament lymphadenopathy. There are some scattered lymph nodes in the hepatoduodenal ligament but these are not substantially enlarged or changed since the 02/2009 14 exam. Previously identified retroperitoneal lymph node measuring 13 x 25 mm on image 79 today was 15 x 26 mm on 06/19/2013 and 14 x 26 mm on 03/04/2013. No pelvic sidewall lymphadenopathy.  Vasculature: Atherosclerotic calcification is noted in the wall of the abdominal aorta without aneurysm.  Pelvic Genitourinary: Bladder is unremarkable. Prostate gland appears normal size with associated dystrophic calcification, nonspecific.  Bones/Musculoskeletal: Bone windows reveal no worrisome lytic or sclerotic osseous lesions.  Body Wall: Bilateral inguinal hernias contain only fat.  Other: No  intraperitoneal free fluid.    IMPRESSION: 1. Mediastinal and right hilar lymphadenopathy identified on the recent PET-CT has decreased in size in the interval since 06/19/2013. 2. Scattered bilateral small pulmonary nodules. 1 of the nodules in the right middle lobe measures minimally larger on today's study but the remaining nodules are  all stable. No new pulmonary nodule is evident. 3. Stable small hepatoduodenal ligament lymph nodes. No change in the retroperitoneal aortocaval lymph node measured previously comparing back to the study from 03/04/2013. No new or progressive nodal disease in the abdomen or pelvis.   Electronically Signed   By: [name] Stanley M.D.   On: 09/06/2013 09:35   ASSESSMENT AND PLAN: This is a very pleasant 63 years old white male with history of high-grade be set non-Hodgkin lymphoma suspicious for Burkitt's lymphoma but with negative cytogenetics for 8;14 abnormality status post 6 cycles of systemic chemotherapy with Rituxan and infusional CHOPE completed in early 2012.  He has been observation since that time was no evidence for disease recurrence except for small mediastinal lymphadenopathy seen on recent PET scan but they are too small to be diagnosed with bronchoscopy and endobronchial ultrasound. Recent CT scan of the chest showed decrease in the size of the mediastinal lymph node. I discussed the scan results with the patient and his wife. I recommended for him to continue on observation with repeat CT scan of the chest, abdomen and pelvis in 6 months He was advised to call immediately if he has any concerning symptoms in the interval. The patient voices understanding of current disease status and treatment options and is in agreement with the current care plan.  All questions were answered. The patient knows to call the clinic with any problems, questions or concerns. We can certainly see the patient much sooner if necessary.  Disclaimer: This note was dictated with  voice recognition software. Similar sounding words can inadvertently be transcribed and may not be corrected upon review.

## 2013-09-18 NOTE — Telephone Encounter (Signed)
gv pt appt schedule for nov - [facility] will call pt w/ct - pt aware.

## 2013-09-23 ENCOUNTER — Other Ambulatory Visit: Payer: Medicare HMO

## 2013-09-23 DIAGNOSIS — Z1212 Encounter for screening for malignant neoplasm of rectum: Secondary | ICD-10-CM

## 2013-09-23 NOTE — Progress Notes (Signed)
Pt came in for lab  only 

## 2013-09-24 ENCOUNTER — Encounter: Payer: Self-pay | Admitting: Cardiology

## 2013-09-24 LAB — FECAL OCCULT BLOOD, IMMUNOCHEMICAL: Fecal Occult Bld: NEGATIVE

## 2013-10-09 ENCOUNTER — Ambulatory Visit (INDEPENDENT_AMBULATORY_CARE_PROVIDER_SITE_OTHER): Payer: Medicare HMO | Admitting: Pharmacist

## 2013-10-09 DIAGNOSIS — Z7901 Long term (current) use of anticoagulants: Secondary | ICD-10-CM

## 2013-10-09 DIAGNOSIS — I4891 Unspecified atrial fibrillation: Secondary | ICD-10-CM

## 2013-10-09 LAB — POCT INR: INR: 2.6

## 2013-10-09 NOTE — Patient Instructions (Signed)
Anticoagulation Dose Instructions as of 10/09/2013     Justin Ruiz Tue Wed Thu Fri Sat   New Dose 5 mg 7.5 mg 5 mg 5 mg 5 mg 7.5 mg 5 mg    Description       Continue 7.5mg  mondays and fridays and 5mg  all other days.      INR was 2.6 today

## 2013-10-15 ENCOUNTER — Telehealth: Payer: Self-pay

## 2013-10-15 NOTE — Telephone Encounter (Signed)
Called patient and told them to try and get documentation from urg[facility] care that visit was urgent

## 2013-10-15 NOTE — Telephone Encounter (Signed)
Need a referral from Dr Laurance Flatten

## 2013-10-15 NOTE — Telephone Encounter (Signed)
We can not do referral s to urgent care.

## 2013-10-15 NOTE — Telephone Encounter (Signed)
He was seen at urgent care on 5/9 in the am for a wound on foot and they need a referral for that visit for insurance to pay.

## 2013-11-28 ENCOUNTER — Ambulatory Visit (INDEPENDENT_AMBULATORY_CARE_PROVIDER_SITE_OTHER): Payer: Medicare HMO | Admitting: Family Medicine

## 2013-11-28 ENCOUNTER — Encounter: Payer: Self-pay | Admitting: Family Medicine

## 2013-11-28 VITALS — BP 129/85 | HR 86 | Temp 97.6°F | Ht 71.0 in | Wt 329.0 lb

## 2013-11-28 DIAGNOSIS — Z7901 Long term (current) use of anticoagulants: Secondary | ICD-10-CM

## 2013-11-28 DIAGNOSIS — E119 Type 2 diabetes mellitus without complications: Secondary | ICD-10-CM

## 2013-11-28 DIAGNOSIS — K219 Gastro-esophageal reflux disease without esophagitis: Secondary | ICD-10-CM

## 2013-11-28 DIAGNOSIS — E0843 Diabetes mellitus due to underlying condition with diabetic autonomic (poly)neuropathy: Secondary | ICD-10-CM

## 2013-11-28 DIAGNOSIS — M21611 Bunion of right foot: Secondary | ICD-10-CM

## 2013-11-28 DIAGNOSIS — G909 Disorder of the autonomic nervous system, unspecified: Secondary | ICD-10-CM

## 2013-11-28 DIAGNOSIS — E785 Hyperlipidemia, unspecified: Secondary | ICD-10-CM

## 2013-11-28 DIAGNOSIS — I10 Essential (primary) hypertension: Secondary | ICD-10-CM

## 2013-11-28 DIAGNOSIS — J441 Chronic obstructive pulmonary disease with (acute) exacerbation: Secondary | ICD-10-CM

## 2013-11-28 DIAGNOSIS — M21619 Bunion of unspecified foot: Secondary | ICD-10-CM

## 2013-11-28 DIAGNOSIS — E1349 Other specified diabetes mellitus with other diabetic neurological complication: Secondary | ICD-10-CM

## 2013-11-28 DIAGNOSIS — Z87898 Personal history of other specified conditions: Secondary | ICD-10-CM

## 2013-11-28 DIAGNOSIS — I4891 Unspecified atrial fibrillation: Secondary | ICD-10-CM

## 2013-11-28 DIAGNOSIS — E559 Vitamin D deficiency, unspecified: Secondary | ICD-10-CM

## 2013-11-28 DIAGNOSIS — M21612 Bunion of left foot: Secondary | ICD-10-CM

## 2013-11-28 LAB — POCT CBC
Granulocyte percent: 65.9 %G (ref 37–80)
HCT, POC: 46.8 % (ref 43.5–53.7)
Hemoglobin: 14.8 g/dL (ref 14.1–18.1)
LYMPH, POC: 1.8 (ref 0.6–3.4)
MCH, POC: 29.6 pg (ref 27–31.2)
MCHC: 31.6 g/dL — AB (ref 31.8–35.4)
MCV: 93.5 fL (ref 80–97)
MPV: 8 fL (ref 0–99.8)
PLATELET COUNT, POC: 165 10*3/uL (ref 142–424)
POC Granulocyte: 3.8 (ref 2–6.9)
POC LYMPH %: 30.8 % (ref 10–50)
RBC: 5 M/uL (ref 4.69–6.13)
RDW, POC: 13.8 %
WBC: 5.7 10*3/uL (ref 4.6–10.2)

## 2013-11-28 LAB — POCT INR: INR: 2.4

## 2013-11-28 LAB — POCT UA - MICROALBUMIN: Microalbumin Ur, POC: 20 mg/L

## 2013-11-28 LAB — POCT GLYCOSYLATED HEMOGLOBIN (HGB A1C): Hemoglobin A1C: 7.5

## 2013-11-28 NOTE — Addendum Note (Signed)
Addended by: Earlene Plater on: 11/28/2013 10:31 AM   Modules accepted: Orders

## 2013-11-28 NOTE — Progress Notes (Signed)
Subjective:    Patient ID: Justin Ruiz, male    DOB: 20-Mar-1951, 63 y.o.   MRN: [id number]  HPI Pt here for follow up and management of chronic medical problems. This patient has a multitude of problems. These include insulin-dependent diabetes. Hypertension, atrial fibrillation and hyperlipidemia are also part of his diagnoses. He is followed regularly by the oncologist for his non-Hodgkin's lymphoma. There is a family history of prostate cancer. The patient is due to get a PSA a prostate exam today. He is also due for lab work. He will get a urine microalbumin. We will sign a form for him to get diabetic shoes because of foot problems. Please see outside blood sugars and blood pressures which will be scanned into the record. The patient does complain of burning and stinging in his feet.        Patient Active Problem List   Diagnosis Date Noted  . Pulmonary nodules 07/15/2013  . Mediastinal adenopathy 06/20/2013  . Severe obesity (BMI >= 40) 05/01/2013  . Generalized anxiety disorder 05/01/2013  . Lung nodules 03/11/2013  . Chronic anticoagulation 08/20/2012  . FIBRILLATION, ATRIAL 05/12/2010  . LYMPHOMA 01/26/2010  . OBESITY, UNSPECIFIED 04/27/2009  . CARDIOVASCULAR STUDIES, ABNORMAL 04/27/2009  . ABNORMAL STRESS ELECTROCARDIOGRAM 03/25/2009  . PERSONAL HISTORY OF COLONIC POLYPS 03/02/2009  . DIABETES, TYPE 2 04/29/2007  . HYPERLIPIDEMIA 04/29/2007  . Hypertension 04/29/2007  . Seasonal and perennial allergic rhinitis 04/29/2007  . COPD exacerbation 04/29/2007  . G E R D 04/29/2007  . BENIGN PROSTATIC HYPERTROPHY, HX OF 04/29/2007   Outpatient Encounter Prescriptions as of 11/28/2013  Medication Sig  . albuterol (PROAIR HFA) 108 (90 BASE) MCG/ACT inhaler Inhale 2 puffs into the lungs every 6 (six) hours as needed for wheezing or shortness of breath.  . ALPRAZolam (XANAX) 0.25 MG tablet Take 1 tablet (0.25 mg total) by mouth at bedtime as needed.  . Blood Glucose Calibration  (TAI DOC CONTROL) NORMAL SOLN   . Blood Glucose Monitoring Suppl (Justin CHEK AUTO-CODE VOICE) DEVI USE AS DIRECTED  . Cholecalciferol (VITAMIN D) 1000 UNITS capsule Take 5,000 Units by mouth daily.  Marland Kitchen Justin CHEK AUTO-CODE VOICE test strip USE TWICE DAILY AS DIRECTED FOR GLUCOSE TESTING AND MONITORING  . enalapril (VASOTEC) 20 MG tablet TAKE ONE TABLET BY MOUTH ONCE DAILY  . insulin NPH Human (NOVOLIN N RELION) 100 UNIT/ML injection Inject 0.32 mLs (32 Units total) into the skin 2 (two) times daily before a meal.  . insulin regular (NOVOLIN R,HUMULIN R) 100 units/mL injection Inject 0.32 mLs (32 Units total) into the skin 2 (two) times daily before a meal.  . ipratropium-albuterol (DUONEB) 0.5-2.5 (3) MG/3ML SOLN Take 3 mLs by nebulization every 6 (six) hours as needed.  Elmore Guise Devices (LANCING DEVICE) MISC USE AS DIRECTED  . metFORMIN (GLUCOPHAGE) 500 MG tablet TAKE ONE TABLET BY MOUTH IN THE MORNING AND TWO IN THE EVENING  . metoprolol (LOPRESSOR) 100 MG tablet Take 1 tablet (100 mg total) by mouth 2 (two) times daily.  Marland Kitchen PHARMACIST CHOICE LANCETS MISC   . simvastatin (ZOCOR) 80 MG tablet Take 80 mg by mouth.   . warfarin (COUMADIN) 5 MG tablet 7.46m Monday and Friday:  558mthe rest of the wek.    Review of Systems  Constitutional: Negative.   HENT: Negative.   Eyes: Negative.   Respiratory: Positive for shortness of breath and wheezing (more than usually - recently).   Cardiovascular: Negative.   Gastrointestinal: Negative.   Endocrine: Negative.  Genitourinary: Negative.   Musculoskeletal: Negative.        Neuropathy  Skin: Negative.   Allergic/Immunologic: Negative.   Neurological: Negative.   Hematological: Negative.   Psychiatric/Behavioral: Negative.        Objective:   Physical Exam  Nursing note and vitals reviewed. Constitutional: He is oriented to person, place, and time. He appears well-developed and well-nourished. No distress.  The patient is pleasant and  cooperative and morbidly obese   HENT:  Head: Normocephalic and atraumatic.  Right Ear: External ear normal.  Left Ear: External ear normal.  Mouth/Throat: Oropharynx is clear and moist. No oropharyngeal exudate.  There is nasal congestion bilaterally and he wears both upper and lower dentures  Eyes: Conjunctivae and EOM are normal. Pupils are equal, round, and reactive to light. Right eye exhibits no discharge. Left eye exhibits no discharge. No scleral icterus.  Neck: Normal range of motion. Neck supple. No thyromegaly present.  Cardiovascular: Normal rate, normal heart sounds and intact distal pulses.  Exam reveals no gallop and no friction rub.   No murmur heard. The rhythm is irregular irregular at about 60 per min  Pulmonary/Chest: Effort normal. No respiratory distress. He has wheezes. He has no rales. He exhibits no tenderness.  Breath sounds are distant with sparsely scattered wheezes bilaterally. The patient has a slightly tight cough.  Abdominal: Soft. Bowel sounds are normal. He exhibits no mass. There is no tenderness. There is no rebound and no guarding.  The abdomen is morbidly obese. There no inguinal masses. There is no axillary masses.  Genitourinary: Rectum normal and penis normal. No penile tenderness.  Due to the size of his buttocks I am unable to palpate the prostate gland. There were no rectal masses. There were no inguinal nodes there were no inguinal hernias. External genitalia appeared normal.  Musculoskeletal: Normal range of motion. He exhibits no edema and no tenderness.  Lymphadenopathy:    He has no cervical adenopathy.  Neurological: He is alert and oriented to person, place, and time. He has normal reflexes. No cranial nerve deficit.  Skin: Skin is warm and dry. Rash noted. No erythema. No pallor.  The patient has a monilial rash in the right groin  Psychiatric: He has a normal mood and affect. His behavior is normal. Judgment and thought content normal.    BP 129/85  Pulse 86  Temp(Src) 97.6 F (36.4 C) (Oral)  Ht '5\' 11"'  (1.803 m)  Wt 329 lb (149.233 kg)  BMI 45.91 kg/m2        Assessment & Plan:  1. BENIGN PROSTATIC HYPERTROPHY, HX OF - POCT CBC - PSA, total and free  2. COPD exacerbation - POCT CBC  3. DIABETES, TYPE 2 - POCT glycosylated hemoglobin (Hb A1C) - POCT CBC - POCT UA - Microalbumin  4. Atrial fibrillation, unspecified - POCT CBC  5. Gastroesophageal reflux disease, esophagitis presence not specified - POCT CBC  6. HYPERLIPIDEMIA - POCT CBC - NMR, lipoprofile  7. Hypertension - POCT CBC - BMP8+EGFR - Hepatic function panel  8. Vitamin D deficiency - Vit D  25 hydroxy (rtn osteoporosis monitoring)  9. 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 Bilateral bunions  Patient Instructions                       Medicare Annual Wellness Visit  [facility] and the medical providers at [facility] strive to bring you the best medical  care.  In doing so we not only want to address your current medical conditions and concerns but also to detect new conditions early and prevent illness, disease and health-related problems.    Medicare offers a yearly Wellness Visit which allows our clinical staff to assess your need for preventative services including immunizations, lifestyle education, counseling to decrease risk of preventable diseases and screening for fall risk and other medical concerns.    This visit is provided free of charge (no copay) for all Medicare recipients. The clinical pharmacists at [facility] have begun to conduct these Wellness Visits which will also include a thorough review of all your medications.    As you primary medical provider recommend that you make an appointment for your Annual Wellness Visit if you have not done so already this year.  You may set up this appointment before you  leave today or you may call back ([phone]) and schedule an appointment.  Please make sure when you call that you mention that you are scheduling your Annual Wellness Visit with the clinical pharmacist so that the appointment may be made for the proper length of time.     Continue current medications. Continue good therapeutic lifestyle changes which include good diet and exercise. Fall precautions discussed with patient. If an FOBT was given today- please return it to our front desk. If you are over 63 years old - you may need Prevnar 90 or the adult Pneumonia vaccine.  Continue to followup with the oncologist. Continue to followup with the cardiologist on a yearly basis for the atrial fibrillation Continue to check your feet regularly Drink plenty of fluids Take Mucinex regularly and use your inhalers regularly and or the nebulizer. Continue to try to lose weight by getting as much exercise as possible and watching your diet as closely as possible Continue monitoring blood pressure and blood sugars    Arrie Senate MD

## 2013-11-28 NOTE — Addendum Note (Signed)
Addended by: Earlene Plater on: 11/28/2013 10:28 AM   Modules accepted: Orders

## 2013-11-28 NOTE — Patient Instructions (Addendum)
Medicare Annual Wellness Visit  [facility] and the medical providers at [facility] strive to bring you the best medical care.  In doing so we not only want to address your current medical conditions and concerns but also to detect new conditions early and prevent illness, disease and health-related problems.    Medicare offers a yearly Wellness Visit which allows our clinical staff to assess your need for preventative services including immunizations, lifestyle education, counseling to decrease risk of preventable diseases and screening for fall risk and other medical concerns.    This visit is provided free of charge (no copay) for all Medicare recipients. The clinical pharmacists at [facility] have begun to conduct these Wellness Visits which will also include a thorough review of all your medications.    As you primary medical provider recommend that you make an appointment for your Annual Wellness Visit if you have not done so already this year.  You may set up this appointment before you leave today or you may call back ([phone]) and schedule an appointment.  Please make sure when you call that you mention that you are scheduling your Annual Wellness Visit with the clinical pharmacist so that the appointment may be made for the proper length of time.     Continue current medications. Continue good therapeutic lifestyle changes which include good diet and exercise. Fall precautions discussed with patient. If an FOBT was given today- please return it to our front desk. If you are over 59 years old - you may need Prevnar 2 or the adult Pneumonia vaccine.  Continue to followup with the oncologist. Continue to followup with the cardiologist on a yearly basis for the atrial fibrillation Continue to check your feet regularly Drink plenty of fluids Take Mucinex regularly and use your inhalers regularly and or the  nebulizer. Continue to try to lose weight by getting as much exercise as possible and watching your diet as closely as possible Continue monitoring blood pressure and blood sugars   Anticoagulation Dose Instructions as of 11/28/2013     Dorene Grebe Tue Wed Thu Fri Sat   New Dose 5 mg 7.5 mg 5 mg 5 mg 5 mg 7.5 mg 5 mg    Description       Continue 7.5mg  mondays and fridays and 5mg  all other days.      INR was 2.4 today

## 2013-11-29 LAB — BMP8+EGFR
BUN/Creatinine Ratio: 18 (ref 10–22)
BUN: 18 mg/dL (ref 8–27)
CALCIUM: 9.5 mg/dL (ref 8.6–10.2)
CO2: 29 mmol/L (ref 18–29)
CREATININE: 0.98 mg/dL (ref 0.76–1.27)
Chloride: 98 mmol/L (ref 97–108)
GFR calc Af Amer: 95 mL/min/{1.73_m2} (ref >59)
GFR, EST NON AFRICAN AMERICAN: 82 mL/min/{1.73_m2} (ref >59)
GLUCOSE: 113 mg/dL — AB (ref 65–99)
Potassium: 4.8 mmol/L (ref 3.5–5.2)
Sodium: 141 mmol/L (ref 134–144)

## 2013-11-29 LAB — VITAMIN D 25 HYDROXY (VIT D DEFICIENCY, FRACTURES): Vit D, 25-Hydroxy: 37.5 ng/mL (ref 30.0–100.0)

## 2013-11-29 LAB — HEPATIC FUNCTION PANEL
ALT: 15 IU/L (ref 0–44)
AST: 19 IU/L (ref 0–40)
Albumin: 4 g/dL (ref 3.6–4.8)
Alkaline Phosphatase: 59 IU/L (ref 39–117)
BILIRUBIN TOTAL: 1 mg/dL (ref 0.0–1.2)
Bilirubin, Direct: 0.23 mg/dL (ref 0.00–0.40)
TOTAL PROTEIN: 6.4 g/dL (ref 6.0–8.5)

## 2013-11-29 LAB — NMR, LIPOPROFILE
CHOLESTEROL: 135 mg/dL (ref 100–199)
HDL Cholesterol by NMR: 44 mg/dL (ref >39)
HDL Particle Number: 35.1 umol/L (ref >=30.5)
LDL Particle Number: 804 nmol/L (ref <1000)
LDL SIZE: 20 nm (ref >20.5)
LDLC SERPL CALC-MCNC: 49 mg/dL (ref 0–99)
LP-IR Score: 67 — ABNORMAL HIGH (ref <=45)
SMALL LDL PARTICLE NUMBER: 536 nmol/L — AB (ref <=527)
TRIGLYCERIDES BY NMR: 208 mg/dL — AB (ref 0–149)

## 2013-11-29 LAB — PSA, TOTAL AND FREE
PSA FREE PCT: 48.3 %
PSA, Free: 0.29 ng/mL
PSA: 0.6 ng/mL (ref 0.0–4.0)

## 2013-11-29 LAB — MICROALBUMIN, URINE: MICROALBUM., U, RANDOM: 85.9 ug/mL — AB (ref 0.0–17.0)

## 2013-11-29 NOTE — Progress Notes (Signed)
Sent paperwork to [facility] for diabetic shoes  Faxed to [phone]

## 2013-12-03 ENCOUNTER — Telehealth: Payer: Self-pay | Admitting: Family Medicine

## 2013-12-03 NOTE — Telephone Encounter (Signed)
Message copied by Waverly Ferrari on Tue Dec 03, 2013  9:57 AM ------      Message from: Chipper Herb      Created: Fri Nov 29, 2013  3:47 PM       The blood sugar was elevated at 113. The creatinine, the most important kidney function test was within normal limits. The electrolytes including potassium were within normal limits.      Liver function tests were within normal limit      The cholesterol numbers with advanced lipid testing had an LDL particle number at 804. This is improved from 3 months ago. The LDL C. was also improved at 49. The triglycerides were elevated but improved from 3 months ago.-------- continue current treatment and aggressive therapeutic lifestyle changes      The PSA remains low and within normal limits      The vitamin D level is 37.5.------ increase vitamin D3  To 2000 daily. Finish the 1000 by taking 2 daily and then purchase the vitamin D3 2000 and take 1 daily ------

## 2013-12-05 ENCOUNTER — Encounter: Payer: Self-pay | Admitting: Family Medicine

## 2013-12-09 ENCOUNTER — Telehealth: Payer: Self-pay | Admitting: Internal Medicine

## 2013-12-09 ENCOUNTER — Ambulatory Visit (INDEPENDENT_AMBULATORY_CARE_PROVIDER_SITE_OTHER): Payer: Commercial Managed Care - HMO | Admitting: Internal Medicine

## 2013-12-09 ENCOUNTER — Telehealth: Payer: Self-pay | Admitting: Family Medicine

## 2013-12-09 ENCOUNTER — Encounter: Payer: Self-pay | Admitting: Internal Medicine

## 2013-12-09 VITALS — BP 110/66 | HR 73 | Ht 71.0 in | Wt 342.8 lb

## 2013-12-09 DIAGNOSIS — R918 Other nonspecific abnormal finding of lung field: Secondary | ICD-10-CM

## 2013-12-09 DIAGNOSIS — J438 Other emphysema: Secondary | ICD-10-CM

## 2013-12-09 DIAGNOSIS — J439 Emphysema, unspecified: Secondary | ICD-10-CM

## 2013-12-09 DIAGNOSIS — E669 Obesity, unspecified: Secondary | ICD-10-CM

## 2013-12-09 DIAGNOSIS — C8589 Other specified types of non-Hodgkin lymphoma, extranodal and solid organ sites: Secondary | ICD-10-CM

## 2013-12-09 DIAGNOSIS — J441 Chronic obstructive pulmonary disease with (acute) exacerbation: Secondary | ICD-10-CM

## 2013-12-09 MED ORDER — METHYLPREDNISOLONE ACETATE 80 MG/ML IJ SUSP
80.0000 mg | Freq: Once | INTRAMUSCULAR | Status: AC
  Administered 2013-12-09: 80 mg via INTRAMUSCULAR

## 2013-12-09 MED ORDER — LEVALBUTEROL HCL 0.63 MG/3ML IN NEBU
0.6300 mg | INHALATION_SOLUTION | Freq: Once | RESPIRATORY_TRACT | Status: AC
  Administered 2013-12-09: 0.63 mg via RESPIRATORY_TRACT

## 2013-12-09 NOTE — Assessment & Plan Note (Addendum)
Followed by oncology. Nodules and nodes were smaller or stable on latest chest CT.

## 2013-12-09 NOTE — Patient Instructions (Addendum)
Neb xop 0.63  Depo 80  Order- walk test on room air  Dx COPD with emphysema  Sample Breo Ellipta maintenance inhaler  1 puff, then rinse mouth, once daily  You can still use your albuterol rescue inhaler or your nebulizer machine up to every 4-6 hours if needed  DME-O2 start; 2l/m continuous and portable Dx COPD with emphysema

## 2013-12-09 NOTE — Progress Notes (Signed)
12/02/11- 62 yoM former smoker followed for COPD, OSA, complicated by hx lymphoma, AFib/ coumadin PFT 11/23/06- 0.47.   Hx 35 pk yr cigs.  LOV- 06/28/10  Patient states doing "pretty good". c/o sob with exertion. denies chest pain, chest tightness, wheezing, and cough. DOE especially on stairs- no change. Uses neb 1-2x/ yr. Seasonal pollens bother some.  OSA status not addressed this visit. COPD Assessment Test( CAT) 16/24  01/09/12-  61 yoM former smoker followed for COPD, OSA, complicated by hx lymphoma, AFib/ coumadin ACUTE VISIT: unable to lay flat-not able to breathe; Increased SOB and wheezing-getting worse; breathing tx at 4am and used rescue inhaler as well; has been going on since yesterday. Wife here On arrival today o2 sat 80, responded to 3 L O2. COPD assessment test (CAT) 40/40. Did well at [facility] on vacation without recognized fluid retention or other acute event. Home over the past week- Gradually worse shortness of breath x2 days with increased cough, watery rhinorrhea. Rattling chest congestion x2 days. Denies fever, pain, green, blood, sore throat. Wife gave antihistamine yesterday. CT chest 11/01/11-reviewed with them IMPRESSION:  1. New airspace opacity in the left lower lobe - pneumonia or  pulmonary hemorrhage could give a similar appearance. Follow-up  chest radiography to ensure clearance is recommended.  2. Several small right lung nodules are stable and nonspecific due  to small size.  3. Mediastinal lymph nodes are slightly increased in size compared  the prior exam, but currently within normal size limits.  4. Atherosclerosis.   01/22/13- 61 yoM former smoker followed for COPD, OSA, complicated by hx lymphoma, AFib/ coumadin pt reports breathing is doing well-- denies any other concerns at this time Schedule for vaccine next week. Denies cough wheeze or active concern. Seasonal rhinitis with watery nose. Rescue inhaler sometimes  once a day, currently 2 or 3 times per  week. Denies any concerns about potential cough relation enalapril-discussed CXR 09/03/12 IMPRESSION:  No acute or metastatic cardiopulmonary abnormality.  rec Refills for rescue inhaler and nebulizer  06/26/2013 pulmonary consultation /[facility] re: ? Recurrent lymphoma vs ca Chief Complaint  Patient presents with  . Follow-up    Pt here at the request of Dr. Beryle Beams for eval of PET and possible Bronch. Pt reports having increased DOE for the past month.  doe indolent onset x steps, flat ok - no cough on ACEi  Has duoneb/ saba hfa but hasn't tried them before desired activity  No obvious day to day or daytime variabilty or assoc chronic cough or cp or chest tightness, subjective wheeze overt sinus or hb symptoms. No unusual exp hx or h/o childhood pna/ asthma or knowledge of premature birth.  Sleeping ok without nocturnal  or early am exacerbation  of respiratory  c/o's or need for noct saba. Also denies any obvious fluctuation of symptoms with weather or environmental changes or other aggravating or alleviating factors except as outlined above  Pex:  Somewhat frail amb male nad  Wt Readings from Last 3 Encounters:  06/26/13 331 lb 12.8 oz (150.503 kg)  06/19/13 324 lb 8 oz (147.192 kg)  05/01/13 326 lb (147.873 kg)    HEENT mild turbinate edema.  Oropharynx no thrush or excess pnd or cobblestoning.  No JVD or cervical adenopathy. Mild accessory muscle hypertrophy. Trachea midline, nl thryroid. Chest was hyperinflated by percussion with diminished breath sounds and moderate increased exp time without wheeze. Hoover sign positive at mid inspiration. Regular rate and rhythm without murmur gallop or rub or  increase P2 or edema.  Abd: no hsm, nl excursion. Ext warm without cyanosis or clubbing.     12/09/13-  72 yoM former smoker followed for COPD, OSA, complicated by hx lymphoma, DM , AFib/ coumadin, morbid obesity FOLLOWS FOR: Increased SOB over the past month; wheezing at times, sats  dropping in 80's(has noticed this more at night time) He no longer has home oxygen. O2 sat here dropped to 86% at rest standing on room air today on arrival. He had been needing to use nebulizer more regularly for chest tightness before leaving for beach, and has not improved. Denies acute infection, chest pain, andkle edema or bleeding. Using home nebulizer with DuoNeb. Lymphoma being followed by oncology with CT scan. Nodules are reported smaller. CT chest 09/06/13 IMPRESSION:  1. Mediastinal and right hilar lymphadenopathy identified on the  recent PET-CT has decreased in size in the interval since  06/19/2013.  2. Scattered bilateral small pulmonary nodules. 1 of the nodules in  the right middle lobe measures minimally larger on today's study but  the remaining nodules are all stable. No new pulmonary nodule is  evident.  3. Stable small hepatoduodenal ligament lymph nodes. No change in  the retroperitoneal aortocaval lymph node measured previously  comparing back to the study from 03/04/2013. No new or progressive  nodal disease in the abdomen or pelvis.  Electronically Signed  By: [name] Stanley M.D.  On: 09/06/2013 09:35  *Not clear why he has never had sleep study- first priority is to address O2 needs, but will need to address OSA next visit* Also needs update PFT  ROS-see HPI Constitutional:   No-   weight loss, night sweats, fevers, chills, fatigue, lassitude. HEENT:   No-  headaches, difficulty swallowing, tooth/dental problems, sore throat,       No-  sneezing, itching, ear ache, nasal congestion, post nasal drip,  CV:  No-   chest pain, orthopnea, PND, swelling in lower extremities, anasarca,                                  dizziness, palpitations Resp: + shortness of breath with exertion or at rest.              No-   productive cough,  No non-productive cough,  No- coughing up of blood.              No-   change in color of mucus.  + wheezing.   Skin: No-   rash or  lesions. GI:  No-   heartburn, indigestion, abdominal pain, nausea, vomiting,  GU:  MS:  No-   joint pain or swelling.  . Neuro-     nothing unusual Psych:  No- change in mood or affect. No depression or anxiety.  No memory loss.  OBJ- Physical Exam General- Alert, Oriented, Affect-appropriate, Distress- none acute, morbidly obese Skin- rash-none, lesions- none, excoriation- none Lymphadenopathy- none Head- atraumatic            Eyes- Gross vision intact, PERRLA, conjunctivae and secretions clear            Ears- Hearing, canals-normal            Nose- Clear, no-Septal dev, mucus, polyps, erosion, perforation             Throat- Mallampati II , mucosa clear , drainage- none, tonsils- atrophic Neck- flexible , trachea midline, no stridor , thyroid nl, carotid  no bruit Chest - symmetrical excursion , unlabored           Heart/CV- IRR , no murmur , no gallop  , no rub, nl s1 s2                           - JVD- none , edema- none, stasis changes- none, varices- none           Lung- decreased airflow, unlabored, wheeze-+ faint, cough- none , dullness-none, rub-                                             none           Chest wall-  Abd-  Br/ Gen/ Rectal- Not done, not indicated Extrem- cyanosis- none, clubbing, none, atrophy- none, strength- nl Neuro- grossly intact to observation

## 2013-12-09 NOTE — Assessment & Plan Note (Signed)
Morbid obesity representing a significant load on his cardiopulmonary reserve and contributing to his dyspnea. Discussed.

## 2013-12-09 NOTE — Telephone Encounter (Signed)
His oxygen level dropped last night to 86 and patient got real sob. Patient O2 level now was 92 and when he got up to walk it dropped right back down to 86. Patient has a pulse oximetry at home and is monitoring. Patient wife advised if SOB and O2 level is 86 it was very important that she take him to the ER for evaluation. Patient wife aware and understands and states that she will take him for evaluation.

## 2013-12-09 NOTE — Assessment & Plan Note (Signed)
Oxygenation is worse without defined acute event but with increased wheeze. I don't see obvious acute fluid overload, PE or infection Plan-treated for bronchospasm with nebulizer treatment, Depo-Medrol, maintenance controller inhaler. Steroid talk pertinent to his diabetes. He qualifies for home oxygen based on his resting saturation of 86% today on room air

## 2013-12-09 NOTE — Assessment & Plan Note (Signed)
Associated with lymphoma and decreasing in size

## 2013-12-09 NOTE — Telephone Encounter (Signed)
Spoke with pt's wife.  She reports pt sats are dropping to 86 - 89 % on RA.  SOB with any exertion.  Diff lying flat at night.  Swelling in feet and legs.  Appt given today at 2:15 with Dr Gwenette Greet.

## 2013-12-26 ENCOUNTER — Encounter: Payer: Self-pay | Admitting: Gastroenterology

## 2013-12-27 ENCOUNTER — Other Ambulatory Visit: Payer: Self-pay | Admitting: Family Medicine

## 2014-01-02 ENCOUNTER — Encounter: Payer: Self-pay | Admitting: Pharmacist

## 2014-01-02 ENCOUNTER — Ambulatory Visit (INDEPENDENT_AMBULATORY_CARE_PROVIDER_SITE_OTHER): Payer: Medicare HMO | Admitting: Pharmacist

## 2014-01-02 VITALS — BP 130/90 | HR 70

## 2014-01-02 DIAGNOSIS — Z7901 Long term (current) use of anticoagulants: Secondary | ICD-10-CM

## 2014-01-02 DIAGNOSIS — I4891 Unspecified atrial fibrillation: Secondary | ICD-10-CM

## 2014-01-02 LAB — POCT INR: INR: 2.3

## 2014-01-02 NOTE — Patient Instructions (Signed)
Anticoagulation Dose Instructions as of 01/02/2014     Justin Ruiz Tue Wed Thu Fri Sat   New Dose 5 mg 7.5 mg 5 mg 5 mg 5 mg 7.5 mg 5 mg    Description       Continue warfarin 7.5mg  mondays and fridays and 5mg  all other days.      INR was 2.3 today

## 2014-01-13 ENCOUNTER — Encounter: Payer: Self-pay | Admitting: Gastroenterology

## 2014-01-21 ENCOUNTER — Ambulatory Visit: Payer: Commercial Managed Care - HMO | Admitting: Internal Medicine

## 2014-01-23 ENCOUNTER — Ambulatory Visit: Payer: Medicare Other | Admitting: Internal Medicine

## 2014-01-24 ENCOUNTER — Ambulatory Visit (INDEPENDENT_AMBULATORY_CARE_PROVIDER_SITE_OTHER): Payer: Commercial Managed Care - HMO | Admitting: Adult Health

## 2014-01-24 ENCOUNTER — Encounter: Payer: Self-pay | Admitting: Adult Health

## 2014-01-24 ENCOUNTER — Ambulatory Visit: Payer: Commercial Managed Care - HMO | Admitting: Internal Medicine

## 2014-01-24 VITALS — BP 114/60 | HR 82 | Temp 98.3°F | Ht 71.0 in | Wt 339.6 lb

## 2014-01-24 DIAGNOSIS — J441 Chronic obstructive pulmonary disease with (acute) exacerbation: Secondary | ICD-10-CM

## 2014-01-24 DIAGNOSIS — Z23 Encounter for immunization: Secondary | ICD-10-CM

## 2014-01-24 DIAGNOSIS — G4719 Other hypersomnia: Secondary | ICD-10-CM

## 2014-01-24 MED ORDER — ALBUTEROL SULFATE HFA 108 (90 BASE) MCG/ACT IN AERS
2.0000 | INHALATION_SPRAY | Freq: Four times a day (QID) | RESPIRATORY_TRACT | Status: DC | PRN
Start: 2014-01-24 — End: 2014-12-26

## 2014-01-24 NOTE — Assessment & Plan Note (Signed)
Recent flare now resolved  Needs PFT  Declines BREO rx  Flu shot today  Advised on O2 use   Plan   Please wear your oxygen 2l/m continuously .  Work on weight loss.  May use ProAir HFA or Duoneb every 4hrs as needed for wheezing /shortness of breath.  Follow up Dr. Annamaria Boots  In 6 weeks with PFT

## 2014-01-24 NOTE — Patient Instructions (Addendum)
We are setting you up for a sleep study Please wear your oxygen 2l/m continuously .  Work on weight loss.  May use ProAir HFA or Duoneb every 4hrs as needed for wheezing /shortness of breath.  Follow up Dr. Annamaria Boots  In 6 weeks with PFT

## 2014-01-24 NOTE — Assessment & Plan Note (Signed)
Daytime sleepiness, obesity , snoring   Plan  We are setting you up for a sleep study

## 2014-01-24 NOTE — Progress Notes (Signed)
12/02/11- 62 yoM former smoker followed for COPD, OSA, complicated by hx lymphoma, AFib/ coumadin PFT 11/23/06- 0.47.   Hx 35 pk yr cigs.  LOV- 06/28/10  Patient states doing "pretty good". c/o sob with exertion. denies chest pain, chest tightness, wheezing, and cough. DOE especially on stairs- no change. Uses neb 1-2x/ yr. Seasonal pollens bother some.  OSA status not addressed this visit. COPD Assessment Test( CAT) 16/24  01/09/12-  61 yoM former smoker followed for COPD, OSA, complicated by hx lymphoma, AFib/ coumadin ACUTE VISIT: unable to lay flat-not able to breathe; Increased SOB and wheezing-getting worse; breathing tx at 4am and used rescue inhaler as well; has been going on since yesterday. Wife here On arrival today o2 sat 80, responded to 3 L O2. COPD assessment test (CAT) 40/40. Did well at [facility] on vacation without recognized fluid retention or other acute event. Home over the past week- Gradually worse shortness of breath x2 days with increased cough, watery rhinorrhea. Rattling chest congestion x2 days. Denies fever, pain, green, blood, sore throat. Wife gave antihistamine yesterday. CT chest 11/01/11-reviewed with them IMPRESSION:  1. New airspace opacity in the left lower lobe - pneumonia or  pulmonary hemorrhage could give a similar appearance. Follow-up  chest radiography to ensure clearance is recommended.  2. Several small right lung nodules are stable and nonspecific due  to small size.  3. Mediastinal lymph nodes are slightly increased in size compared  the prior exam, but currently within normal size limits.  4. Atherosclerosis.   01/22/13- 61 yoM former smoker followed for COPD, OSA, complicated by hx lymphoma, AFib/ coumadin pt reports breathing is doing well-- denies any other concerns at this time Schedule for vaccine next week. Denies cough wheeze or active concern. Seasonal rhinitis with watery nose. Rescue inhaler sometimes  once a day, currently 2 or 3 times per  week. Denies any concerns about potential cough relation enalapril-discussed CXR 09/03/12 IMPRESSION:  No acute or metastatic cardiopulmonary abnormality.  rec Refills for rescue inhaler and nebulizer  06/26/2013 pulmonary consultation /Wert re: ? Recurrent lymphoma vs ca Chief Complaint  Patient presents with  . Follow-up    Pt here at the request of Dr. Beryle Beams for eval of PET and possible Bronch. Pt reports having increased DOE for the past month.  doe indolent onset x steps, flat ok - no cough on ACEi  Has duoneb/ saba hfa but hasn't tried them before desired activity   12/09/13-  61 yoM former smoker followed for COPD, OSA, complicated by hx lymphoma, DM , AFib/ coumadin, morbid obesity FOLLOWS FOR: Increased SOB over the past month; wheezing at times, sats dropping in 80's(has noticed this more at night time) He no longer has home oxygen. O2 sat here dropped to 86% at rest standing on room air today on arrival. He had been needing to use nebulizer more regularly for chest tightness before leaving for beach, and has not improved. Denies acute infection, chest pain, andkle edema or bleeding. Using home nebulizer with DuoNeb. Lymphoma being followed by oncology with CT scan. Nodules are reported smaller. CT chest 09/06/13 IMPRESSION:  1. Mediastinal and right hilar lymphadenopathy identified on the  recent PET-CT has decreased in size in the interval since  06/19/2013.  2. Scattered bilateral small pulmonary nodules. 1 of the nodules in  the right middle lobe measures minimally larger on today's study but  the remaining nodules are all stable. No new pulmonary nodule is  evident.  3. Stable small hepatoduodenal  ligament lymph nodes. No change in  the retroperitoneal aortocaval lymph node measured previously  comparing back to the study from 03/04/2013. No new or progressive  nodal disease in the abdomen or pelvis.  Electronically Signed  By: [name] Stanley M.D.  On: 09/06/2013  09:35  *Not clear why he has never had sleep study- first priority is to address O2 needs, but will need to address OSA next visit* Also needs update PFT  01/24/2014 Follow up  Returns for 6 week follow up for COPD flare  Tx w/ Depo Medrol shot last ov and started on BREO . Does not want to continue BREO says he does not feel he needs it.  Has duoneb at home but does not use it.  No recent PFt  Started on O2 last ov for persistent desats.  Pt has CT chest next month for follow up for known lung nodules.  Says he is feeling much better , stopped his Oxygen . We  Discussed need to wear at all times. O2 sats 83% on RA on arrival to office , at rest 90% on RA . Of note on ACE, denies cough.  Has daytime sleepiness, snoring , never had a sleep study in past.     ROS-see HPI Constitutional:   No-   weight loss, night sweats, fevers, chills, fatigue, lassitude. HEENT:   No-  headaches, difficulty swallowing, tooth/dental problems, sore throat,       No-  sneezing, itching, ear ache, nasal congestion, post nasal drip,  CV:  No-   chest pain, orthopnea, PND, swelling in lower extremities, anasarca,                                  dizziness, palpitations Resp: + shortness of breath with exertion or at rest.              No-   productive cough,  No non-productive cough,  No- coughing up of blood.              No-   change in color of mucus.   Skin: No-   rash or lesions. GI:  No-   heartburn, indigestion, abdominal pain, nausea, vomiting,  GU:  MS:  No-   joint pain or swelling.  . Neuro-     nothing unusual Psych:  No- change in mood or affect. No depression or anxiety.  No memory loss.  OBJ- Physical Exam General- Alert, Oriented, Affect-appropriate, Distress- none acute, morbidly obese Skin- rash-none, lesions- none, excoriation- none Lymphadenopathy- none Head- atraumatic            Eyes- Gross vision intact, PERRLA, conjunctivae and secretions clear            Ears- Hearing,  canals-normal            Nose- Clear, no-Septal dev, mucus, polyps, erosion, perforation             Throat- Mallampati II , mucosa clear , drainage- none, tonsils- atrophic Neck- flexible , trachea midline, no stridor , thyroid nl, carotid no bruit Chest - symmetrical excursion , unlabored           Heart/CV- IRR , no murmur , no gallop  , no rub, nl s1 s2                           - JVD- none , edema-  none, stasis changes- none, varices- none           Lung- decreased airflow, unlabored, wheeze-none none , dullness-none, rub-                                             none           Chest wall-  Abd-  Br/ Gen/ Rectal- Not done, not indicated Extrem- cyanosis- none, clubbing, none, atrophy- none, strength- nl Neuro- grossly intact to observation

## 2014-01-24 NOTE — Addendum Note (Signed)
Addended by: Parke Poisson E on: 01/24/2014 12:34 PM   Modules accepted: Orders

## 2014-01-27 ENCOUNTER — Ambulatory Visit: Payer: Commercial Managed Care - HMO | Admitting: Physician Assistant

## 2014-01-28 ENCOUNTER — Other Ambulatory Visit: Payer: Self-pay | Admitting: Pharmacist

## 2014-02-08 ENCOUNTER — Ambulatory Visit (INDEPENDENT_AMBULATORY_CARE_PROVIDER_SITE_OTHER): Payer: Medicare HMO | Admitting: Family Medicine

## 2014-02-08 ENCOUNTER — Encounter: Payer: Self-pay | Admitting: Family Medicine

## 2014-02-08 VITALS — BP 140/70 | HR 93 | Temp 97.0°F | Ht 71.0 in | Wt 330.0 lb

## 2014-02-08 DIAGNOSIS — E0843 Diabetes mellitus due to underlying condition with diabetic autonomic (poly)neuropathy: Secondary | ICD-10-CM

## 2014-02-08 DIAGNOSIS — R0602 Shortness of breath: Secondary | ICD-10-CM

## 2014-02-08 DIAGNOSIS — C8592 Non-Hodgkin lymphoma, unspecified, intrathoracic lymph nodes: Secondary | ICD-10-CM

## 2014-02-08 DIAGNOSIS — J029 Acute pharyngitis, unspecified: Secondary | ICD-10-CM

## 2014-02-08 DIAGNOSIS — J441 Chronic obstructive pulmonary disease with (acute) exacerbation: Secondary | ICD-10-CM

## 2014-02-08 DIAGNOSIS — C8599 Non-Hodgkin lymphoma, unspecified, extranodal and solid organ sites: Secondary | ICD-10-CM

## 2014-02-08 LAB — POCT RAPID STREP A (OFFICE): Rapid Strep A Screen: NEGATIVE

## 2014-02-08 MED ORDER — ALPRAZOLAM 0.25 MG PO TABS: 0.2500 mg | ORAL_TABLET | Freq: Every evening | ORAL | Status: DC | PRN

## 2014-02-08 MED ORDER — BUDESONIDE-FORMOTEROL FUMARATE 160-4.5 MCG/ACT IN AERO
2.0000 | INHALATION_SPRAY | Freq: Two times a day (BID) | RESPIRATORY_TRACT | Status: DC
Start: 2014-02-08 — End: 2016-03-28

## 2014-02-08 MED ORDER — BUDESONIDE 0.5 MG/2ML IN SUSP
0.5000 mg | Freq: Once | RESPIRATORY_TRACT | Status: AC
  Administered 2014-02-08: 0.5 mg via RESPIRATORY_TRACT

## 2014-02-08 MED ORDER — ALBUTEROL SULFATE (2.5 MG/3ML) 0.083% IN NEBU
2.5000 mg | INHALATION_SOLUTION | Freq: Once | RESPIRATORY_TRACT | Status: AC
  Administered 2014-02-08: 2.5 mg via RESPIRATORY_TRACT

## 2014-02-08 MED ORDER — METHYLPREDNISOLONE ACETATE 80 MG/ML IJ SUSP
60.0000 mg | Freq: Once | INTRAMUSCULAR | Status: AC
  Administered 2014-02-08: 60 mg via INTRAMUSCULAR

## 2014-02-08 MED ORDER — AZITHROMYCIN 250 MG PO TABS: ORAL_TABLET | ORAL | Status: DC

## 2014-02-08 NOTE — Patient Instructions (Signed)
Use DuoNeb at least 3 times daily Use Symbicort 160/4.5   2 puffs twice daily rinse mouth after use Take Mucinex maximum strength, blue and white in color, over-the-counter, one twice daily with a large glass of water Drink plenty of fluid Use O2 at home as needed Take antibiotic as directed Remember that the shot of Depo-Medrol may make your blood sugar go up, so monitor this more closely  return to clinic if symptoms get worse

## 2014-02-08 NOTE — Progress Notes (Signed)
Subjective:    Patient ID: Justin Ruiz, male    DOB: 08/12/1950, 63 y.o.   MRN: [id number]  HPI Patient here today for cough, congestion and shortness of breath. The patient comes to the visit today with his wife. He does complain of a scratchy throat and increased shortness of breath. His granddaughter is getting married and he was outside a lot yesterday. The wedding is today and he will most likely not a 12, to avoid being exposed to any more whether or related irritation. He has duo neb at home but has not been using this regularly.       Patient Active Problem List   Diagnosis Date Noted  . Excessive daytime sleepiness 01/24/2014  . Mediastinal adenopathy 06/20/2013  . Severe obesity (BMI >= 40) 05/01/2013  . Generalized anxiety disorder 05/01/2013  . Lung nodules 03/11/2013  . Chronic anticoagulation 08/20/2012  . FIBRILLATION, ATRIAL 05/12/2010  . LYMPHOMA 01/26/2010  . OBESITY, UNSPECIFIED 04/27/2009  . CARDIOVASCULAR STUDIES, ABNORMAL 04/27/2009  . ABNORMAL STRESS ELECTROCARDIOGRAM 03/25/2009  . PERSONAL HISTORY OF COLONIC POLYPS 03/02/2009  . DIABETES, TYPE 2 04/29/2007  . HYPERLIPIDEMIA 04/29/2007  . Hypertension 04/29/2007  . Seasonal and perennial allergic rhinitis 04/29/2007  . COPD exacerbation 04/29/2007  . G E R D 04/29/2007  . BENIGN PROSTATIC HYPERTROPHY, HX OF 04/29/2007   Outpatient Encounter Prescriptions as of 02/08/2014  Medication Sig  . albuterol (PROAIR HFA) 108 (90 BASE) MCG/ACT inhaler Inhale 2 puffs into the lungs every 6 (six) hours as needed for wheezing or shortness of breath.  . ALPRAZolam (XANAX) 0.25 MG tablet Take 1 tablet (0.25 mg total) by mouth at bedtime as needed.  . Blood Glucose Calibration (TAI DOC CONTROL) NORMAL SOLN   . Blood Glucose Monitoring Suppl (CLEVER CHEK AUTO-CODE VOICE) DEVI USE AS DIRECTED  . Cholecalciferol (VITAMIN D) 1000 UNITS capsule Take 5,000 Units by mouth daily.  Marland Kitchen CLEVER CHEK AUTO-CODE VOICE test strip  USE TWICE DAILY AS DIRECTED FOR GLUCOSE TESTING AND MONITORING  . enalapril (VASOTEC) 20 MG tablet TAKE ONE TABLET BY MOUTH ONCE DAILY  . insulin NPH Human (NOVOLIN N RELION) 100 UNIT/ML injection Inject 0.32 mLs (32 Units total) into the skin 2 (two) times daily before a meal.  . insulin regular (NOVOLIN R RELION) 100 units/mL injection Inject 0.32 mLs (32 Units total) into the skin 2 (two) times daily before a meal.  . ipratropium-albuterol (DUONEB) 0.5-2.5 (3) MG/3ML SOLN Take 3 mLs by nebulization every 6 (six) hours as needed.  Elmore Guise Devices (LANCING DEVICE) MISC USE AS DIRECTED  . metFORMIN (GLUCOPHAGE) 500 MG tablet TAKE ONE TABLET BY MOUTH IN THE MORNING AND TWO IN THE EVENING  . metoprolol (LOPRESSOR) 100 MG tablet Take 1 tablet (100 mg total) by mouth 2 (two) times daily.  Marland Kitchen PHARMACIST CHOICE LANCETS MISC   . simvastatin (ZOCOR) 80 MG tablet Take 40 mg by mouth daily.   Marland Kitchen warfarin (COUMADIN) 5 MG tablet 7.48m Monday and Friday:  569mthe rest of the wek.    Review of Systems  Constitutional: Negative.  Negative for fever and chills.  HENT: Positive for congestion, postnasal drip, sinus pressure and sore throat.   Eyes: Positive for discharge (watery).  Respiratory: Positive for cough, shortness of breath and wheezing.   Cardiovascular: Negative.   Gastrointestinal: Negative.   Endocrine: Negative.   Genitourinary: Negative.   Musculoskeletal: Negative.   Skin: Negative.   Allergic/Immunologic: Negative.   Neurological: Positive for light-headedness and headaches.  Hematological: Negative.   Psychiatric/Behavioral: Negative.        Objective:   Physical Exam  Nursing note and vitals reviewed. Constitutional: He is oriented to person, place, and time. He appears well-developed and well-nourished. No distress.  HENT:  Head: Normocephalic and atraumatic.  Right Ear: External ear normal.  Left Ear: External ear normal.  Mouth/Throat: No oropharyngeal exudate.  Nasal  congestion bilaterally. Minimal redness in throat. No significant drainage or discharge in throat or nose  Eyes: Conjunctivae and EOM are normal. Pupils are equal, round, and reactive to light. Right eye exhibits no discharge. Left eye exhibits no discharge. No scleral icterus.  Neck: Normal range of motion. Neck supple. No thyromegaly present.  No anterior nodes  Cardiovascular: Normal rate, regular rhythm and normal heart sounds.  Exam reveals no gallop and no friction rub.   No murmur heard. The heart has a fairly regular rate and rhythm today.  Pulmonary/Chest: Effort normal. No respiratory distress. He has no wheezes. He has no rales. He exhibits no tenderness.   The patient is not moving a lot of air. He has a dry cough. There are no wheezes apparent. There are no rales.  Abdominal: He exhibits no mass.  Musculoskeletal: Normal range of motion. He exhibits no edema.  Lymphadenopathy:    He has no cervical adenopathy.  Neurological: He is alert and oriented to person, place, and time.  Skin: Skin is warm and dry. No rash noted.  Psychiatric: He has a normal mood and affect. His behavior is normal. Judgment and thought content normal.   BP 140/70  Pulse 93  Temp(Src) 97 F (36.1 C) (Oral)  Ht '5\' 11"'  (1.803 m)  Wt 330 lb (149.687 kg)  BMI 46.05 kg/m2  SpO2 90%  The breathing treatment with albuterol and Pulmicort was given during the visit-- the patient says his treatment help him breathe better. The rapid strep that was done in the office was negative and he was informed of this.      Assessment & Plan:  1. Sore throat - POCT rapid strep A - Strep A culture, throat - methylPREDNISolone acetate (DEPO-MEDROL) injection 60 mg; Inject 0.75 mLs (60 mg total) into the muscle once.  2. SOB (shortness of breath) - methylPREDNISolone acetate (DEPO-MEDROL) injection 60 mg; Inject 0.75 mLs (60 mg total) into the muscle once. - albuterol (PROVENTIL) (2.5 MG/3ML) 0.083% nebulizer  solution 2.5 mg; Take 3 mLs (2.5 mg total) by nebulization once. - budesonide (PULMICORT) nebulizer solution 0.5 mg; Take 2 mLs (0.5 mg total) by nebulization once.  3. COPD exacerbation  4. Severe obesity (BMI >= 40)  5. Diabetic autonomic neuropathy associated with diabetes mellitus due to underlying condition  6. Chronic obstructive pulmonary disease with acute exacerbation  7. Lymphoma involving lung  Meds ordered this encounter  Medications  . methylPREDNISolone acetate (DEPO-MEDROL) injection 60 mg    Sig:   . azithromycin (ZITHROMAX) 250 MG tablet    Sig: As directed    Dispense:  6 tablet    Refill:  0  . budesonide-formoterol (SYMBICORT) 160-4.5 MCG/ACT inhaler    Sig: Inhale 2 puffs into the lungs 2 (two) times daily.    Dispense:  1 Inhaler    Refill:  3  . albuterol (PROVENTIL) (2.5 MG/3ML) 0.083% nebulizer solution 2.5 mg    Sig:   . budesonide (PULMICORT) nebulizer solution 0.5 mg    Sig:    Patient Instructions  Use DuoNeb at least 3 times daily Use Symbicort  160/4.5   2 puffs twice daily rinse mouth after use Take Mucinex maximum strength, blue and white in color, over-the-counter, one twice daily with a large glass of water Drink plenty of fluid Use O2 at home as needed Take antibiotic as directed Remember that the shot of Depo-Medrol may make your blood sugar go up, so monitor this more closely  return to clinic if symptoms get worse   Arrie Senate MD

## 2014-02-10 ENCOUNTER — Ambulatory Visit: Payer: Commercial Managed Care - HMO | Admitting: Physician Assistant

## 2014-02-11 ENCOUNTER — Ambulatory Visit: Payer: Medicare HMO

## 2014-02-12 ENCOUNTER — Telehealth: Payer: Self-pay | Admitting: *Deleted

## 2014-02-12 ENCOUNTER — Ambulatory Visit (INDEPENDENT_AMBULATORY_CARE_PROVIDER_SITE_OTHER): Payer: Medicare HMO | Admitting: Pharmacist

## 2014-02-12 DIAGNOSIS — Z7901 Long term (current) use of anticoagulants: Secondary | ICD-10-CM

## 2014-02-12 DIAGNOSIS — I4891 Unspecified atrial fibrillation: Secondary | ICD-10-CM

## 2014-02-12 LAB — STREP A CULTURE, THROAT

## 2014-02-12 LAB — POCT INR: INR: 2.6

## 2014-02-12 NOTE — Progress Notes (Signed)
Patient had planned to get pneumonia vaccine today but he recently had taken ABX (last dose today) and his influenza vaccine was 01/24/2014.  Instructed him to call back in 1-2 weeks and will schedule pneumonia vaccine.   Cherre Robins, PharmD, CPP

## 2014-02-12 NOTE — Patient Instructions (Signed)
Anticoagulation Dose Instructions as of 02/12/2014     Justin Ruiz Tue Wed Thu Fri Sat   New Dose 5 mg 7.5 mg 5 mg 5 mg 5 mg 7.5 mg 5 mg    Description       Continue warfarin 7.5mg  mondays and fridays and 5mg  all other days.      INR was 2.6 today

## 2014-02-12 NOTE — Telephone Encounter (Signed)
Aware. 

## 2014-02-12 NOTE — Telephone Encounter (Signed)
Message copied by Shelbie Ammons on Wed Feb 12, 2014  4:15 PM ------      Message from: Chipper Herb      Created: Wed Feb 12, 2014  1:47 PM       The strep culture grew beta-hemolytic colonies but not group A strep----continue and complete the antibiotic as prescribed ------

## 2014-02-20 ENCOUNTER — Ambulatory Visit (INDEPENDENT_AMBULATORY_CARE_PROVIDER_SITE_OTHER): Payer: Medicare HMO | Admitting: Family Medicine

## 2014-02-20 ENCOUNTER — Other Ambulatory Visit: Payer: Self-pay | Admitting: *Deleted

## 2014-02-20 ENCOUNTER — Encounter: Payer: Self-pay | Admitting: Family Medicine

## 2014-02-20 VITALS — BP 127/73 | HR 88 | Temp 97.9°F | Ht 71.0 in | Wt 336.0 lb

## 2014-02-20 DIAGNOSIS — J9801 Acute bronchospasm: Secondary | ICD-10-CM

## 2014-02-20 DIAGNOSIS — Z23 Encounter for immunization: Secondary | ICD-10-CM

## 2014-02-20 DIAGNOSIS — Z7901 Long term (current) use of anticoagulants: Secondary | ICD-10-CM

## 2014-02-20 DIAGNOSIS — I4891 Unspecified atrial fibrillation: Secondary | ICD-10-CM

## 2014-02-20 DIAGNOSIS — J4521 Mild intermittent asthma with (acute) exacerbation: Secondary | ICD-10-CM

## 2014-02-20 NOTE — Progress Notes (Signed)
Subjective:    Patient ID: Justin Ruiz, male    DOB: 06/19/1950, 63 y.o.   MRN: [id number]  HPI Patient here today for 2 week follow up on breathing. He states he is feeling better now. The patient is much improved and seemed to get better after just a few days. He is still using his Symbicort and albuterol as needed.        Patient Active Problem List   Diagnosis Date Noted  . Excessive daytime sleepiness 01/24/2014  . Mediastinal adenopathy 06/20/2013  . Severe obesity (BMI >= 40) 05/01/2013  . Generalized anxiety disorder 05/01/2013  . Lung nodules 03/11/2013  . Chronic anticoagulation 08/20/2012  . FIBRILLATION, ATRIAL 05/12/2010  . LYMPHOMA 01/26/2010  . OBESITY, UNSPECIFIED 04/27/2009  . CARDIOVASCULAR STUDIES, ABNORMAL 04/27/2009  . ABNORMAL STRESS ELECTROCARDIOGRAM 03/25/2009  . PERSONAL HISTORY OF COLONIC POLYPS 03/02/2009  . DIABETES, TYPE 2 04/29/2007  . HYPERLIPIDEMIA 04/29/2007  . Hypertension 04/29/2007  . Seasonal and perennial allergic rhinitis 04/29/2007  . COPD exacerbation 04/29/2007  . G E R D 04/29/2007  . BENIGN PROSTATIC HYPERTROPHY, HX OF 04/29/2007   Outpatient Encounter Prescriptions as of 02/20/2014  Medication Sig  . albuterol (PROAIR HFA) 108 (90 BASE) MCG/ACT inhaler Inhale 2 puffs into the lungs every 6 (six) hours as needed for wheezing or shortness of breath.  . ALPRAZolam (XANAX) 0.25 MG tablet Take 1 tablet (0.25 mg total) by mouth at bedtime as needed.  . Blood Glucose Calibration (TAI DOC CONTROL) NORMAL SOLN   . Blood Glucose Monitoring Suppl (CLEVER CHEK AUTO-CODE VOICE) DEVI USE AS DIRECTED  . budesonide-formoterol (SYMBICORT) 160-4.5 MCG/ACT inhaler Inhale 2 puffs into the lungs 2 (two) times daily.  . Cholecalciferol (VITAMIN D) 1000 UNITS capsule Take 5,000 Units by mouth daily.  Marland Kitchen CLEVER CHEK AUTO-CODE VOICE test strip USE TWICE DAILY AS DIRECTED FOR GLUCOSE TESTING AND MONITORING  . enalapril (VASOTEC) 20 MG tablet TAKE ONE  TABLET BY MOUTH ONCE DAILY  . insulin NPH Human (NOVOLIN N RELION) 100 UNIT/ML injection Inject 0.32 mLs (32 Units total) into the skin 2 (two) times daily before a meal.  . insulin regular (NOVOLIN R RELION) 100 units/mL injection Inject 0.32 mLs (32 Units total) into the skin 2 (two) times daily before a meal.  . ipratropium-albuterol (DUONEB) 0.5-2.5 (3) MG/3ML SOLN Take 3 mLs by nebulization every 6 (six) hours as needed.  Elmore Guise Devices (LANCING DEVICE) MISC USE AS DIRECTED  . metFORMIN (GLUCOPHAGE) 500 MG tablet TAKE ONE TABLET BY MOUTH IN THE MORNING AND TWO IN THE EVENING  . metoprolol (LOPRESSOR) 100 MG tablet Take 1 tablet (100 mg total) by mouth 2 (two) times daily.  Marland Kitchen PHARMACIST CHOICE LANCETS MISC   . simvastatin (ZOCOR) 80 MG tablet Take 40 mg by mouth daily.   Marland Kitchen warfarin (COUMADIN) 5 MG tablet 7.1m Monday and Friday:  574mthe rest of the wek.  . [DISCONTINUED] azithromycin (ZITHROMAX) 250 MG tablet As directed    Review of Systems  Constitutional: Negative.   HENT: Negative.   Eyes: Negative.   Respiratory: Negative.   Cardiovascular: Negative.   Gastrointestinal: Negative.   Endocrine: Negative.   Genitourinary: Negative.   Musculoskeletal: Negative.   Skin: Negative.   Allergic/Immunologic: Negative.   Neurological: Negative.   Hematological: Negative.   Psychiatric/Behavioral: Negative.        Objective:   Physical Exam  Nursing note and vitals reviewed. Constitutional: He appears well-developed and well-nourished.  HENT:  Head: Normocephalic and  atraumatic.  Right Ear: External ear normal.  Left Ear: External ear normal.  Mouth/Throat: Oropharynx is clear and moist. No oropharyngeal exudate.  Mild nasal congestion bilaterally  Eyes: Conjunctivae and EOM are normal. Pupils are equal, round, and reactive to light. Right eye exhibits no discharge. Left eye exhibits no discharge.  Neck: Normal range of motion. Neck supple. No thyromegaly present.    Cardiovascular: Normal rate, regular rhythm and normal heart sounds.   No murmur heard. The rhythm appeared to be regular today  Pulmonary/Chest: Effort normal and breath sounds normal. No respiratory distress. He has no wheezes. He has no rales.  The breath sounds were diminished bilaterally but there was no wheezing or rales or rhonchi.  Musculoskeletal: Normal range of motion.  Lymphadenopathy:    He has no cervical adenopathy.  Neurological: He is alert.  Skin: Skin is warm and dry. No rash noted.  Psychiatric: He has a normal mood and affect. His behavior is normal. Judgment and thought content normal.   BP 127/73  Pulse 88  Temp(Src) 97.9 F (36.6 C) (Oral)  Ht '5\' 11"'  (1.803 m)  Wt 336 lb (152.409 kg)  BMI 46.88 kg/m2        Assessment & Plan:  1. Bronchospasm  2. Asthma with acute exacerbation, mild intermittent  Patient Instructions  Continue with Symbicort for 3-4 more weeks and then gradually reduce this. Avoid irritating environments if possible Continued to drink plenty of fluids and use the albuterol inhaler as needed   Arrie Senate MD

## 2014-02-20 NOTE — Patient Instructions (Signed)
Continue with Symbicort for 3-4 more weeks and then gradually reduce this. Avoid irritating environments if possible Continued to drink plenty of fluids and use the albuterol inhaler as needed

## 2014-02-25 ENCOUNTER — Telehealth: Payer: Self-pay | Admitting: *Deleted

## 2014-02-25 ENCOUNTER — Encounter: Payer: Self-pay | Admitting: Physician Assistant

## 2014-02-25 ENCOUNTER — Ambulatory Visit (INDEPENDENT_AMBULATORY_CARE_PROVIDER_SITE_OTHER): Payer: Commercial Managed Care - HMO | Admitting: Physician Assistant

## 2014-02-25 VITALS — BP 140/92 | HR 88 | Ht 69.5 in | Wt 339.6 lb

## 2014-02-25 DIAGNOSIS — J441 Chronic obstructive pulmonary disease with (acute) exacerbation: Secondary | ICD-10-CM

## 2014-02-25 DIAGNOSIS — Z800 Family history of malignant neoplasm of digestive organs: Secondary | ICD-10-CM

## 2014-02-25 DIAGNOSIS — Z86010 Personal history of colonic polyps: Secondary | ICD-10-CM

## 2014-02-25 DIAGNOSIS — Z7901 Long term (current) use of anticoagulants: Secondary | ICD-10-CM

## 2014-02-25 MED ORDER — NA SULFATE-K SULFATE-MG SULF 17.5-3.13-1.6 GM/177ML PO SOLN: 1.0000 | Freq: Once | ORAL | Status: AC

## 2014-02-25 NOTE — Telephone Encounter (Signed)
  02/25/2014   RE: Tyrian Peart DOB: 11/29/1950 MRN: [id number]   Dear Dr. Minus Breeding,    We have scheduled the above patient for an endoscopic procedure. Our records show that he is on anticoagulation therapy.   Please advise as to how long the patient may come off his therapy of Coumadin prior to the procedure, which is scheduled for 05-05-2014.  Please fax back/ or route the completed form to [facility] at [phone].   Sincerely,    Amy Esterwood PA-C

## 2014-02-25 NOTE — Progress Notes (Signed)
Subjective:    Patient ID: Justin Ruiz, male    DOB: 1950/09/14, 63 y.o.   MRN: [id number]  HPI Justin Ruiz is a pleasant 63 year old white male known previously to Dr. Deatra Ina from prior colonoscopies. He comes in today to discuss follow-up colonoscopy. He is maintained on chronic Coumadin for atrial fibrillation. Last colonoscopy was done in November 2010 with finding of 3 polyps largest was 5 mm. These were all removed 2 were tubular adenomas and one hyperplastic. Patient does have family history of colon cancer in his father. He denies any current GI complaints. He says occasionally he has problems with hemorrhoids and may see a small amount of blood. Patient has history of COPD and asthma did have a recent exacerbation with an upper respiratory infection. He says he is feeling better at this point. He has oxygen at home to use as needed in general uses it at night. He also has history of lymphoma. He completed chemotherapy about 3 years ago and is due for follow-up imaging for restaging later in November. Other medical problems include adult-onset diabetes mellitus, hyperlipidemia, morbid obesity, hypertension,. He has not had any recent cardiac issues and is followed by Dr. Oran Rein run    Review of Systems  Constitutional: Negative.   HENT: Negative.   Eyes: Negative.   Respiratory: Positive for shortness of breath.   Cardiovascular: Negative.   Gastrointestinal: Negative.   Endocrine: Negative.   Musculoskeletal: Negative.   Skin: Negative.   Allergic/Immunologic: Negative.   Neurological: Negative.   Hematological: Negative.   Psychiatric/Behavioral: Negative.    Outpatient Prescriptions Prior to Visit  Medication Sig Dispense Refill  . albuterol (PROAIR HFA) 108 (90 BASE) MCG/ACT inhaler Inhale 2 puffs into the lungs every 6 (six) hours as needed for wheezing or shortness of breath. 8.5 g 5  . ALPRAZolam (XANAX) 0.25 MG tablet Take 1 tablet (0.25 mg total) by mouth at bedtime as  needed. 30 tablet 2  . Blood Glucose Calibration (TAI DOC CONTROL) NORMAL SOLN     . Blood Glucose Monitoring Suppl (CLEVER CHEK AUTO-CODE VOICE) DEVI USE AS DIRECTED 1 each 2  . budesonide-formoterol (SYMBICORT) 160-4.5 MCG/ACT inhaler Inhale 2 puffs into the lungs 2 (two) times daily. 1 Inhaler 3  . Cholecalciferol (VITAMIN D) 1000 UNITS capsule Take 5,000 Units by mouth daily.    Marland Kitchen CLEVER CHEK AUTO-CODE VOICE test strip USE TWICE DAILY AS DIRECTED FOR GLUCOSE TESTING AND MONITORING 200 each 2  . enalapril (VASOTEC) 20 MG tablet TAKE ONE TABLET BY MOUTH ONCE DAILY 30 tablet 4  . insulin NPH Human (NOVOLIN N RELION) 100 UNIT/ML injection Inject 0.32 mLs (32 Units total) into the skin 2 (two) times daily before a meal. 40 mL 2  . insulin regular (NOVOLIN R RELION) 100 units/mL injection Inject 0.32 mLs (32 Units total) into the skin 2 (two) times daily before a meal. 40 mL 2  . ipratropium-albuterol (DUONEB) 0.5-2.5 (3) MG/3ML SOLN Take 3 mLs by nebulization every 6 (six) hours as needed. 75 mL prn  . Lancet Devices (LANCING DEVICE) MISC USE AS DIRECTED 1 each 2  . metFORMIN (GLUCOPHAGE) 500 MG tablet TAKE ONE TABLET BY MOUTH IN THE MORNING AND TWO IN THE EVENING 270 tablet 3  . metoprolol (LOPRESSOR) 100 MG tablet Take 1 tablet (100 mg total) by mouth 2 (two) times daily. 180 tablet 3  . PHARMACIST CHOICE LANCETS MISC     . simvastatin (ZOCOR) 80 MG tablet Take 40 mg by mouth daily.     Marland Kitchen  warfarin (COUMADIN) 5 MG tablet 7.24m Monday and Friday:  565mthe rest of the wek. 45 tablet 6   No facility-administered medications prior to visit.   Allergies  Allergen Reactions  . Avapro [Irbesartan]   . Lipitor [Atorvastatin Calcium] Other (See Comments)    Leg pain   Patient Active Problem List   Diagnosis Date Noted  . Excessive daytime sleepiness 01/24/2014  . Mediastinal adenopathy 06/20/2013  . Severe obesity (BMI >= 40) 05/01/2013  . Generalized anxiety disorder 05/01/2013  . Lung nodules  03/11/2013  . Chronic anticoagulation 08/20/2012  . FIBRILLATION, ATRIAL 05/12/2010  . LYMPHOMA 01/26/2010  . OBESITY, UNSPECIFIED 04/27/2009  . CARDIOVASCULAR STUDIES, ABNORMAL 04/27/2009  . ABNORMAL STRESS ELECTROCARDIOGRAM 03/25/2009  . PERSONAL HISTORY OF COLONIC POLYPS 03/02/2009  . DIABETES, TYPE 2 04/29/2007  . HYPERLIPIDEMIA 04/29/2007  . Hypertension 04/29/2007  . Seasonal and perennial allergic rhinitis 04/29/2007  . COPD exacerbation 04/29/2007  . G E R D 04/29/2007  . BENIGN PROSTATIC HYPERTROPHY, HX OF 04/29/2007   History  Substance Use Topics  . Smoking status: Former Smoker -- 2.00 packs/day    Start date: 12/17/1968    Quit date: 11/14/2004  . Smokeless tobacco: Never Used     Comment: 2 ppd   . Alcohol Use: No     Comment: previous   family history includes Aortic aneurysm in his sister; COPD in his sister; Colon cancer in his father; Colon polyps in his father; Diabetes in his maternal grandmother; Heart attack in his sister; Liver cancer in his mother; Prostate cancer in his father; Pulmonary embolism in his sister.    Objective:   Physical Exam well-developed morbidly obese white male in no acute distress, pleasant accompanied by his wife blood pressure 140/92 pulse 88 height 5 foot 9 weight 339, BMI 47. HEENT nontraumatic normocephalic EOMI PERRLA sclera anicteric, Supple; no JVD, Cardiovascular ; irregular rate and rhythm with S1-S2no murmur rub or gallop, Pulmonary, decreased breath sounds bilaterally, Abdomen ;morbidly obese soft nontender nondistended bowel sounds are present and palpable mass or hepatosplenomegaly, Rectal ;exam not done, Extremities; no clubbing cyanosis or edema skin warm and dry, Psych ;mood and affect appropriate        Assessment & Plan:  #1 [id number] old male with history of adenomatous colon polyps and family history of colon cancer in patient's father due for follow-up colonoscopy #2 chronic anticoagulation with Coumadin #3 atrial  fibrillation #4 morbid obesity BMI 47 #5 COPD with nocturnal O2 use #6 history of asthma #7 hypertension #8 history of lymphoma status post chemotherapy proximally 3 years ago patient plan for follow-up imaging later in November  Plan; patient will be scheduled for colonoscopy with Dr. KaDeatra InaProcedure discussed in detail with patient and he is agreeable to proceed. Procedure will be scheduled at the hospital due to oxygen use. We will obtain consent from his cardiologist Dr. HoPercival Spanishor him to hold Coumadin for 5 days prior to the procedure.

## 2014-02-25 NOTE — Telephone Encounter (Signed)
OK to hold warfarin as needed for the GI procedure.  No bridging is indicated.

## 2014-02-25 NOTE — Patient Instructions (Signed)
You have been scheduled for a colonoscopy. Please follow written instructions given to you at your visit today.  We have given you free sample prep for the colonsocopy. If you use inhalers (even only as needed), please bring them with you on the day of your procedure. Your physician has requested that you go to www.startemmi.com and enter the access code given to you at your visit today. This web site gives a general overview about your procedure. However, you should still follow specific instructions given to you by our office regarding your preparation for the procedure.

## 2014-02-26 NOTE — Telephone Encounter (Signed)
I advised patient we heard back from Dr. Minus Breeding regarding the coumadin.  He is to stop his Coumadin ( Warfarin) on 1-6 and resume it on 05-06-2013. Patient verbalized understanding the instructions.

## 2014-02-26 NOTE — Telephone Encounter (Signed)
Routed to [employer], PA-C and Marisue Humble, CMA thru [facility]

## 2014-02-26 NOTE — Telephone Encounter (Signed)
Justin Ruiz, just making sure you got this message on coumadin hold

## 2014-02-26 NOTE — Progress Notes (Signed)
Reviewed and agree with management. Danta Baumgardner D. Datron Brakebill, M.D., FACG  

## 2014-03-16 ENCOUNTER — Other Ambulatory Visit: Payer: Self-pay | Admitting: Family Medicine

## 2014-03-17 ENCOUNTER — Encounter (HOSPITAL_COMMUNITY): Payer: Self-pay

## 2014-03-17 ENCOUNTER — Other Ambulatory Visit (HOSPITAL_BASED_OUTPATIENT_CLINIC_OR_DEPARTMENT_OTHER): Payer: Commercial Managed Care - HMO

## 2014-03-17 ENCOUNTER — Ambulatory Visit (HOSPITAL_COMMUNITY)
Admission: RE | Admit: 2014-03-17 | Discharge: 2014-03-17 | Disposition: A | Discharge status: Home / Self Care | Payer: Medicare HMO | Source: Ambulatory Visit | Attending: Internal Medicine | Admitting: Internal Medicine

## 2014-03-17 DIAGNOSIS — K4090 Unilateral inguinal hernia, without obstruction or gangrene, not specified as recurrent: Secondary | ICD-10-CM | POA: Insufficient documentation

## 2014-03-17 DIAGNOSIS — R918 Other nonspecific abnormal finding of lung field: Secondary | ICD-10-CM | POA: Diagnosis not present

## 2014-03-17 DIAGNOSIS — Z9221 Personal history of antineoplastic chemotherapy: Secondary | ICD-10-CM | POA: Insufficient documentation

## 2014-03-17 DIAGNOSIS — I700 Atherosclerosis of aorta: Secondary | ICD-10-CM | POA: Diagnosis not present

## 2014-03-17 DIAGNOSIS — K5730 Diverticulosis of large intestine without perforation or abscess without bleeding: Secondary | ICD-10-CM | POA: Diagnosis not present

## 2014-03-17 DIAGNOSIS — C8590 Non-Hodgkin lymphoma, unspecified, unspecified site: Secondary | ICD-10-CM

## 2014-03-17 DIAGNOSIS — K8020 Calculus of gallbladder without cholecystitis without obstruction: Secondary | ICD-10-CM | POA: Diagnosis not present

## 2014-03-17 DIAGNOSIS — K760 Fatty (change of) liver, not elsewhere classified: Secondary | ICD-10-CM | POA: Diagnosis not present

## 2014-03-17 DIAGNOSIS — R599 Enlarged lymph nodes, unspecified: Secondary | ICD-10-CM | POA: Insufficient documentation

## 2014-03-17 DIAGNOSIS — I2510 Atherosclerotic heart disease of native coronary artery without angina pectoris: Secondary | ICD-10-CM | POA: Insufficient documentation

## 2014-03-17 DIAGNOSIS — M479 Spondylosis, unspecified: Secondary | ICD-10-CM | POA: Insufficient documentation

## 2014-03-17 DIAGNOSIS — C8599 Non-Hodgkin lymphoma, unspecified, extranodal and solid organ sites: Secondary | ICD-10-CM

## 2014-03-17 LAB — COMPREHENSIVE METABOLIC PANEL
ALBUMIN: 3.5 g/dL (ref 3.5–5.2)
ALK PHOS: 57 U/L (ref 39–117)
ALT: 15 U/L (ref 0–53)
AST: 19 U/L (ref 0–37)
BUN: 19 mg/dL (ref 6–23)
CO2: 31 meq/L (ref 19–32)
Calcium: 9.3 mg/dL (ref 8.4–10.5)
Chloride: 100 mEq/L (ref 96–112)
Creatinine, Ser: 1 mg/dL (ref 0.50–1.35)
GLUCOSE: 184 mg/dL — AB (ref 70–99)
POTASSIUM: 4.7 meq/L (ref 3.5–5.3)
Sodium: 141 mEq/L (ref 135–145)
TOTAL PROTEIN: 7.1 g/dL (ref 6.0–8.3)
Total Bilirubin: 1.1 mg/dL (ref 0.3–1.2)

## 2014-03-17 LAB — CBC WITH DIFFERENTIAL/PLATELET
BASO%: 0.1 % (ref 0.0–2.0)
Basophils Absolute: 0 10*3/uL (ref 0.0–0.1)
EOS%: 0.7 % (ref 0.0–7.0)
Eosinophils Absolute: 0.1 10*3/uL (ref 0.0–0.5)
HCT: 44.9 % (ref 38.4–49.9)
HGB: 14.5 g/dL (ref 13.0–17.1)
LYMPH%: 21.7 % (ref 14.0–49.0)
MCH: 30.9 pg (ref 27.2–33.4)
MCHC: 32.3 g/dL (ref 32.0–36.0)
MCV: 95.7 fL (ref 79.3–98.0)
MONO#: 0.4 10*3/uL (ref 0.1–0.9)
MONO%: 5.8 % (ref 0.0–14.0)
NEUT%: 71.7 % (ref 39.0–75.0)
NEUTROS ABS: 4.8 10*3/uL (ref 1.5–6.5)
PLATELETS: 157 10*3/uL (ref 140–400)
RBC: 4.69 10*6/uL (ref 4.20–5.82)
RDW: 14 % (ref 11.0–14.6)
WBC: 6.7 10*3/uL (ref 4.0–10.3)
lymph#: 1.5 10*3/uL (ref 0.9–3.3)

## 2014-03-17 LAB — LACTATE DEHYDROGENASE (CC13): LDH: 236 U/L (ref 125–245)

## 2014-03-17 MED ORDER — IOHEXOL 300 MG/ML  SOLN
100.0000 mL | Freq: Once | INTRAMUSCULAR | Status: AC | PRN
  Administered 2014-03-17: 100 mL via INTRAVENOUS

## 2014-03-19 ENCOUNTER — Encounter: Payer: Self-pay | Admitting: Internal Medicine

## 2014-03-19 ENCOUNTER — Ambulatory Visit (HOSPITAL_BASED_OUTPATIENT_CLINIC_OR_DEPARTMENT_OTHER): Payer: Commercial Managed Care - HMO | Admitting: Internal Medicine

## 2014-03-19 ENCOUNTER — Telehealth: Payer: Self-pay | Admitting: Internal Medicine

## 2014-03-19 VITALS — BP 140/72 | HR 81 | Temp 98.6°F | Resp 18 | Ht 69.0 in | Wt 340.2 lb

## 2014-03-19 DIAGNOSIS — Z8572 Personal history of non-Hodgkin lymphomas: Secondary | ICD-10-CM

## 2014-03-19 DIAGNOSIS — C8590 Non-Hodgkin lymphoma, unspecified, unspecified site: Secondary | ICD-10-CM

## 2014-03-19 NOTE — Telephone Encounter (Signed)
Gave avs & cal for May 2016. °

## 2014-03-19 NOTE — Progress Notes (Signed)
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 2) hypermetabolic mediastinal lymphadenopathy.  PRIOR THERAPY: Status post systemic chemotherapy with 6 cycles of infusional CHOPE with Rituxan.  CURRENT THERAPY: Observation  INTERVAL HISTORY: Justin Ruiz 63 y.o. male returns to the clinic today for followup visit accompanied by his wife. The patient is feeling well today with no specific complaints except for mild fatigue. He denied having any significant weight loss or night sweats. He has no palpable lymphadenopathy. He has no bleeding issues. He has no chest pain but continues to have shortness breath with exertion with no cough or hemoptysis. He had repeat CT scan of the chest, abdomen and pelvis performed recently and he is here for evaluation and discussion of his scan results.  MEDICAL HISTORY: Past Medical History  Diagnosis Date  . Diabetes mellitus   . Asthma   . Hyperlipidemia   . Obesity     exogenous  . Impotence   . Wheezing   . Left knee pain   . BPH (benign prostatic hypertrophy)   . Colon polyps   . Diabetes mellitus  type 2   . COPD (chronic obstructive pulmonary disease)   . A-fib   . Shingles   . AR (allergic rhinitis)   . GERD (gastroesophageal reflux disease)   . Hx of colonic polyps   . Arthritis   . Asthma   . HTN (hypertension)     x 3 years  . Pneumonia   . Lung nodules 03/11/2013  . Mediastinal adenopathy 06/20/2013    CT  & PET 2/15   . Cancer   . NHL (non-Hodgkin's lymphoma)     nhl dx 9/11  . B-cell lymphoma   . Non Hodgkin's lymphoma b cell    ALLERGIES:  is allergic to avapro and lipitor.  MEDICATIONS:  Current Outpatient Prescriptions  Medication Sig Dispense Refill  . albuterol (PROAIR HFA) 108 (90 BASE) MCG/ACT inhaler  Inhale 2 puffs into the lungs every 6 (six) hours as needed for wheezing or shortness of breath. 8.5 g 5  . ALPRAZolam (XANAX) 0.25 MG tablet Take 1 tablet (0.25 mg total) by mouth at bedtime as needed. 30 tablet 2  . Blood Glucose Calibration (TAI DOC CONTROL) NORMAL SOLN     . Blood Glucose Monitoring Suppl (CLEVER CHEK AUTO-CODE VOICE) DEVI USE AS DIRECTED 1 each 2  . budesonide-formoterol (SYMBICORT) 160-4.5 MCG/ACT inhaler Inhale 2 puffs into the lungs 2 (two) times daily. 1 Inhaler 3  . Cholecalciferol (VITAMIN D) 1000 UNITS capsule Take 5,000 Units by mouth daily.    Marland Kitchen CLEVER CHEK AUTO-CODE VOICE test strip USE TWICE DAILY AS DIRECTED FOR GLUCOSE TESTING AND MONITORING 200 each 2  . enalapril (VASOTEC) 20 MG tablet TAKE ONE TABLET BY MOUTH ONCE DAILY 30 tablet 4  . insulin NPH Human (NOVOLIN N RELION) 100 UNIT/ML injection Inject 0.32 mLs (32 Units total) into the skin 2 (two) times daily before a meal. 40 mL 2  . insulin regular (NOVOLIN R RELION) 100 units/mL injection Inject 0.32 mLs (32 Units total) into the skin 2 (two) times daily before a meal. 40 mL 2  . ipratropium-albuterol (DUONEB) 0.5-2.5 (3) MG/3ML SOLN Take 3 mLs by nebulization every 6 (six) hours as needed. 75 mL prn  . Lancet Devices (LANCING  DEVICE) MISC USE AS DIRECTED 1 each 2  . metFORMIN (GLUCOPHAGE) 500 MG tablet TAKE ONE TABLET BY MOUTH IN THE MORNING AND TWO IN THE EVENING 270 tablet 3  . metoprolol (LOPRESSOR) 100 MG tablet Take 1 tablet (100 mg total) by mouth 2 (two) times daily. 180 tablet 3  . Na Sulfate-K Sulfate-Mg Sulf SOLN Take 1 kit by mouth once. 354 mL 0  . PHARMACIST CHOICE LANCETS MISC     . simvastatin (ZOCOR) 80 MG tablet Take 40 mg by mouth daily.     Marland Kitchen warfarin (COUMADIN) 5 MG tablet TAKE ONE & ONE-HALF TABLETS BY MOUTH ONCE DAILY ON  MONDAY  AND  FRIDAY  AND  THEN  TAKE  1  TABLET  THE  REST  OF  THE  WEEK 45 tablet 1  . [DISCONTINUED] digoxin (LANOXIN) 0.25 MG tablet Take 250 mcg by mouth daily.       No current facility-administered medications for this visit.    SURGICAL HISTORY:  Past Surgical History  Procedure Laterality Date  . Appendectomy      REVIEW OF SYSTEMS:  Constitutional: negative Eyes: negative Ears, nose, mouth, throat, and face: negative Respiratory: positive for dyspnea on exertion Cardiovascular: negative Gastrointestinal: negative Genitourinary:negative Integument/breast: negative Hematologic/lymphatic: negative Musculoskeletal:negative Neurological: negative Behavioral/Psych: negative Endocrine: negative Allergic/Immunologic: negative   PHYSICAL EXAMINATION: General appearance: alert, cooperative and no distress Head: Normocephalic, without obvious abnormality, atraumatic Neck: no adenopathy, no JVD, supple, symmetrical, trachea midline and thyroid not enlarged, symmetric, no tenderness/mass/nodules Lymph nodes: Cervical, supraclavicular, and axillary nodes normal. Resp: clear to auscultation bilaterally Back: symmetric, no curvature. ROM normal. No CVA tenderness. Cardio: regular rate and rhythm, S1, S2 normal, no murmur, click, rub or gallop GI: soft, non-tender; bowel sounds normal; no masses,  no organomegaly Extremities: extremities normal, atraumatic, no cyanosis or edema Neurologic: Alert and oriented X 3, normal strength and tone. Normal symmetric reflexes. Normal coordination and gait  ECOG PERFORMANCE STATUS: 1 - Symptomatic but completely ambulatory  Blood pressure 140/72, pulse 81, temperature 98.6 F (37 C), temperature source Oral, resp. rate 18, height _0  (1.753 m), weight 340 lb 3.2 oz (154.314 kg), SpO2 89 %.  LABORATORY DATA: Lab Results  Component Value Date   WBC 6.7 03/17/2014   HGB 14.5 03/17/2014   HCT 44.9 03/17/2014   MCV 95.7 03/17/2014   PLT 157 03/17/2014      Chemistry      Component Value Date/Time   NA 141 03/17/2014 0806   NA 141 11/28/2013 1026   NA 141 09/06/2013 0800   NA 136 11/01/2011 0753    K 4.7 03/17/2014 0806   K 4.7 09/06/2013 0800   K 4.8* 11/01/2011 0753   CL 100 03/17/2014 0806   CL 100 09/03/2012 0751   CL 91* 11/01/2011 0753   CO2 31 03/17/2014 0806   CO2 25 09/06/2013 0800   CO2 34* 11/01/2011 0753   BUN 19 03/17/2014 0806   BUN 18 11/28/2013 1026   BUN 22.2 09/06/2013 0800   BUN 18 11/01/2011 0753   CREATININE 1.00 03/17/2014 0806   CREATININE 0.9 09/06/2013 0800   CREATININE 0.82 11/14/2012 0918   CREATININE 0.8 03/29/2012   GLU 185 03/29/2012      Component Value Date/Time   CALCIUM 9.3 03/17/2014 0806   CALCIUM 10.1 09/06/2013 0800   CALCIUM 8.8 11/01/2011 0753   ALKPHOS 57 03/17/2014 0806   ALKPHOS 59 09/06/2013 0800   ALKPHOS 57 11/01/2011 0753   AST  19 03/17/2014 0806   AST 13 09/06/2013 0800   AST 14 11/01/2011 0753   ALT 15 03/17/2014 0806   ALT 14 09/06/2013 0800   ALT 17 11/01/2011 0753   BILITOT 1.1 03/17/2014 0806   BILITOT 0.57 09/06/2013 0800   BILITOT 1.50 11/01/2011 0753       RADIOGRAPHIC STUDIES: Ct Chest W Contrast  03/17/2014   CLINICAL DATA:  Non-Hodgkin's lymphoma. Diagnosed in 2012. Chemotherapy complete.  EXAM: CT CHEST, ABDOMEN, AND PELVIS WITH CONTRAST  TECHNIQUE: Multidetector CT imaging of the chest, abdomen and pelvis was performed following the standard protocol during bolus administration of intravenous contrast.  CONTRAST:  156m OMNIPAQUE IOHEXOL 300 MG/ML  SOLN  COMPARISON:  09/06/2013  FINDINGS: CT CHEST FINDINGS  Lungs/Pleura: Multiple subpleural pulmonary nodules which are similar to slightly decreased since the prior exam. These all measure on the order of 2-3 mm. The right middle lobe 7 mm nodule detailed on the prior has resolved. No enlarging nodules are identified.  No pleural fluid.  Heart/Mediastinum: No supraclavicular adenopathy. No axillary adenopathy. Aortic and branch vessel atherosclerosis. Heart size upper normal, with coronary artery atherosclerosis. No central pulmonary embolism, on this  non-dedicated study. Right paratracheal node is unchanged at 6 mm, not pathologic by size criteria. Right hilar node is similar at 6 mm.  Prevascular node is similar at 8 mm.  CT ABDOMEN AND PELVIS FINDINGS  Hepatobiliary: Hepatic steatosis. Cholelithiasis without acute cholecystitis or biliary ductal dilatation.  Pancreas: Normal, without mass or pancreatic ductal dilatation.  Spleen: Normal  Adrenals/Urinary tract: Normal adrenal glands. Too small to characterize interpolar right renal lesion. Normal left kidney, without hydronephrosis. Decompressed urinary bladder.  Stomach/Bowel: Normal stomach, without wall thickening. Scattered colonic diverticula. Normal terminal ileum. Normal small bowel without abdominal ascites.  Vascular/Lymphatic: Aortic and branch vessel atherosclerosis. Retrocaval node measures 2.5 x 1.6 cm on image 79. Similar to on the prior exam (when remeasured). No pelvic adenopathy.  Reproductive: Normal prostate.  Other: Right greater than left fat containing inguinal hernias. No significant free fluid.  Musculoskeletal: Thoracolumbar spondylosis. S-shaped thoracolumbar spine curvature which is mild.  IMPRESSION: CT CHEST IMPRESSION  1. Similar to slight improvement in subpleural pulmonary nodules, favored to represent benign subpleural lymph nodes. The right middle lobe nodule described on the prior exam has resolved. 2. No acute process or evidence of active lymphoma within the chest. 3.  Atherosclerosis, including within the coronary arteries.  CT ABDOMEN AND PELVIS IMPRESSION  1. Similar retrocaval adenopathy. 2. No evidence of new or progressive adenopathy within the abdomen or pelvis. 3. Cholelithiasis.   Electronically Signed   By: KAbigail MiyamotoM.D.   On: 03/17/2014 11:14   Ct Abdomen Pelvis W Contrast  03/17/2014   CLINICAL DATA:  Non-Hodgkin's lymphoma. Diagnosed in 2012. Chemotherapy complete.  EXAM: CT CHEST, ABDOMEN, AND PELVIS WITH CONTRAST  TECHNIQUE: Multidetector CT imaging  of the chest, abdomen and pelvis was performed following the standard protocol during bolus administration of intravenous contrast.  CONTRAST:  1058mOMNIPAQUE IOHEXOL 300 MG/ML  SOLN  COMPARISON:  09/06/2013  FINDINGS: CT CHEST FINDINGS  Lungs/Pleura: Multiple subpleural pulmonary nodules which are similar to slightly decreased since the prior exam. These all measure on the order of 2-3 mm. The right middle lobe 7 mm nodule detailed on the prior has resolved. No enlarging nodules are identified.  No pleural fluid.  Heart/Mediastinum: No supraclavicular adenopathy. No axillary adenopathy. Aortic and branch vessel atherosclerosis. Heart size upper normal, with coronary artery atherosclerosis. No  central pulmonary embolism, on this non-dedicated study. Right paratracheal node is unchanged at 6 mm, not pathologic by size criteria. Right hilar node is similar at 6 mm.  Prevascular node is similar at 8 mm.  CT ABDOMEN AND PELVIS FINDINGS  Hepatobiliary: Hepatic steatosis. Cholelithiasis without acute cholecystitis or biliary ductal dilatation.  Pancreas: Normal, without mass or pancreatic ductal dilatation.  Spleen: Normal  Adrenals/Urinary tract: Normal adrenal glands. Too small to characterize interpolar right renal lesion. Normal left kidney, without hydronephrosis. Decompressed urinary bladder.  Stomach/Bowel: Normal stomach, without wall thickening. Scattered colonic diverticula. Normal terminal ileum. Normal small bowel without abdominal ascites.  Vascular/Lymphatic: Aortic and branch vessel atherosclerosis. Retrocaval node measures 2.5 x 1.6 cm on image 79. Similar to on the prior exam (when remeasured). No pelvic adenopathy.  Reproductive: Normal prostate.  Other: Right greater than left fat containing inguinal hernias. No significant free fluid.  Musculoskeletal: Thoracolumbar spondylosis. S-shaped thoracolumbar spine curvature which is mild.  IMPRESSION: CT CHEST IMPRESSION  1. Similar to slight improvement in  subpleural pulmonary nodules, favored to represent benign subpleural lymph nodes. The right middle lobe nodule described on the prior exam has resolved. 2. No acute process or evidence of active lymphoma within the chest. 3.  Atherosclerosis, including within the coronary arteries.  CT ABDOMEN AND PELVIS IMPRESSION  1. Similar retrocaval adenopathy. 2. No evidence of new or progressive adenopathy within the abdomen or pelvis. 3. Cholelithiasis.   Electronically Signed   By: Abigail Miyamoto M.D.   On: 03/17/2014 11:14    ASSESSMENT AND PLAN: This is a very pleasant 63 years old white male with history of high-grade be set non-Hodgkin lymphoma suspicious for Burkitt's lymphoma but with negative cytogenetics for 8;14 abnormality status post 6 cycles of systemic chemotherapy with Rituxan and infusional CHOPE completed in early 2012.  He has been observation since that time with no evidence for disease recurrence except for small mediastinal lymphadenopathy seen on recent PET scan but they are too small to be diagnosed with bronchoscopy and endobronchial ultrasound. The recent CT scan of the chest showed no significant evidence for disease recurrence. I discussed the scan results with the patient and his wife. I recommended for him to continue on observation with repeat CBC, comprehensive metabolic panel and LDH in 6 months. We will order imaging studies only if the patient has symptoms. He was advised to call immediately if he has any concerning symptoms in the interval. The patient voices understanding of current disease status and treatment options and is in agreement with the current care plan.  All questions were answered. The patient knows to call the clinic with any problems, questions or concerns. We can certainly see the patient much sooner if necessary.  Disclaimer: This note was dictated with voice recognition software. Similar sounding words can inadvertently be transcribed and may not be corrected  upon review.

## 2014-04-03 ENCOUNTER — Ambulatory Visit (INDEPENDENT_AMBULATORY_CARE_PROVIDER_SITE_OTHER): Payer: Medicare HMO | Admitting: Family Medicine

## 2014-04-03 ENCOUNTER — Encounter: Payer: Self-pay | Admitting: Family Medicine

## 2014-04-03 VITALS — BP 147/88 | HR 96 | Temp 97.8°F | Ht 69.0 in | Wt 337.0 lb

## 2014-04-03 DIAGNOSIS — E0843 Diabetes mellitus due to underlying condition with diabetic autonomic (poly)neuropathy: Secondary | ICD-10-CM

## 2014-04-03 DIAGNOSIS — E785 Hyperlipidemia, unspecified: Secondary | ICD-10-CM

## 2014-04-03 DIAGNOSIS — E118 Type 2 diabetes mellitus with unspecified complications: Secondary | ICD-10-CM

## 2014-04-03 DIAGNOSIS — I4819 Other persistent atrial fibrillation: Secondary | ICD-10-CM

## 2014-04-03 DIAGNOSIS — Z7901 Long term (current) use of anticoagulants: Secondary | ICD-10-CM

## 2014-04-03 DIAGNOSIS — E559 Vitamin D deficiency, unspecified: Secondary | ICD-10-CM

## 2014-04-03 DIAGNOSIS — I10 Essential (primary) hypertension: Secondary | ICD-10-CM

## 2014-04-03 DIAGNOSIS — I481 Persistent atrial fibrillation: Secondary | ICD-10-CM

## 2014-04-03 LAB — POCT CBC
GRANULOCYTE PERCENT: 61.6 % (ref 37–80)
HCT, POC: 45.9 % (ref 43.5–53.7)
HEMOGLOBIN: 15.1 g/dL (ref 14.1–18.1)
Lymph, poc: 2 (ref 0.6–3.4)
MCH: 30.3 pg (ref 27–31.2)
MCHC: 32.9 g/dL (ref 31.8–35.4)
MCV: 92.3 fL (ref 80–97)
MPV: 8.1 fL (ref 0–99.8)
POC Granulocyte: 3.6 (ref 2–6.9)
POC LYMPH %: 34.5 % (ref 10–50)
Platelet Count, POC: 171 10*3/uL (ref 142–424)
RBC: 5 M/uL (ref 4.69–6.13)
RDW, POC: 13.6 %
WBC: 5.8 10*3/uL (ref 4.6–10.2)

## 2014-04-03 LAB — POCT INR: INR: 3

## 2014-04-03 LAB — POCT GLYCOSYLATED HEMOGLOBIN (HGB A1C): Hemoglobin A1C: 7.5

## 2014-04-03 MED ORDER — METFORMIN HCL 500 MG PO TABS: ORAL_TABLET | ORAL | Status: DC

## 2014-04-03 MED ORDER — METOPROLOL TARTRATE 100 MG PO TABS: 100.0000 mg | ORAL_TABLET | Freq: Two times a day (BID) | ORAL | Status: DC

## 2014-04-03 MED ORDER — SIMVASTATIN 40 MG PO TABS: 40.0000 mg | ORAL_TABLET | Freq: Every day | ORAL | Status: DC

## 2014-04-03 NOTE — Progress Notes (Signed)
Subjective:    Patient ID: Justin Ruiz, male    DOB: Nov 23, 1950, 63 y.o.   MRN: [id number]  HPI Pt here for follow up and management of chronic medical problems. The patient has a multiplicity of problems. His biggest issue is his non-Hodgkin's lymphoma and his diabetes. He is due to get a ProTime today. He is followed regularly by the oncologist. He will get lab work today. He brings in blood pressures and blood sugars from home for review and most of these are good. There are a few outliers for elevated sugar. This patient sees the cardiologist yearly. He sees the oncologist now every 6 months and a recent CT scan report still has him negative for any lymphoma recurrence.          Patient Active Problem List   Diagnosis Date Noted  . Excessive daytime sleepiness 01/24/2014  . Mediastinal adenopathy 06/20/2013  . Severe obesity (BMI >= 40) 05/01/2013  . Generalized anxiety disorder 05/01/2013  . Lung nodules 03/11/2013  . Chronic anticoagulation 08/20/2012  . FIBRILLATION, ATRIAL 05/12/2010  . NHL (non-Hodgkin's lymphoma) 01/26/2010  . OBESITY, UNSPECIFIED 04/27/2009  . CARDIOVASCULAR STUDIES, ABNORMAL 04/27/2009  . ABNORMAL STRESS ELECTROCARDIOGRAM 03/25/2009  . PERSONAL HISTORY OF COLONIC POLYPS 03/02/2009  . DIABETES, TYPE 2 04/29/2007  . HYPERLIPIDEMIA 04/29/2007  . Hypertension 04/29/2007  . Seasonal and perennial allergic rhinitis 04/29/2007  . COPD exacerbation 04/29/2007  . G E R D 04/29/2007  . BENIGN PROSTATIC HYPERTROPHY, HX OF 04/29/2007   Outpatient Encounter Prescriptions as of 04/03/2014  Medication Sig  . albuterol (PROAIR HFA) 108 (90 BASE) MCG/ACT inhaler Inhale 2 puffs into the lungs every 6 (six) hours as needed for wheezing or shortness of breath.  . ALPRAZolam (XANAX) 0.25 MG tablet Take 1 tablet (0.25 mg total) by mouth at bedtime as needed.  . Blood Glucose Calibration (TAI DOC CONTROL) NORMAL SOLN   . Blood Glucose Monitoring Suppl (CLEVER CHEK  AUTO-CODE VOICE) DEVI USE AS DIRECTED  . budesonide-formoterol (SYMBICORT) 160-4.5 MCG/ACT inhaler Inhale 2 puffs into the lungs 2 (two) times daily.  . Cholecalciferol (VITAMIN D) 1000 UNITS capsule Take 5,000 Units by mouth daily.  Marland Kitchen CLEVER CHEK AUTO-CODE VOICE test strip USE TWICE DAILY AS DIRECTED FOR GLUCOSE TESTING AND MONITORING  . enalapril (VASOTEC) 20 MG tablet TAKE ONE TABLET BY MOUTH ONCE DAILY  . insulin NPH Human (NOVOLIN N RELION) 100 UNIT/ML injection Inject 0.32 mLs (32 Units total) into the skin 2 (two) times daily before a meal.  . insulin regular (NOVOLIN R RELION) 100 units/mL injection Inject 0.32 mLs (32 Units total) into the skin 2 (two) times daily before a meal.  . ipratropium-albuterol (DUONEB) 0.5-2.5 (3) MG/3ML SOLN Take 3 mLs by nebulization every 6 (six) hours as needed.  Elmore Guise Devices (LANCING DEVICE) MISC USE AS DIRECTED  . metFORMIN (GLUCOPHAGE) 500 MG tablet TAKE ONE TABLET BY MOUTH IN THE MORNING AND TWO IN THE EVENING  . metoprolol (LOPRESSOR) 100 MG tablet Take 1 tablet (100 mg total) by mouth 2 (two) times daily.  Marland Kitchen PHARMACIST CHOICE LANCETS MISC   . simvastatin (ZOCOR) 80 MG tablet Take 40 mg by mouth daily.   Marland Kitchen warfarin (COUMADIN) 5 MG tablet TAKE ONE & ONE-HALF TABLETS BY MOUTH ONCE DAILY ON  MONDAY  AND  FRIDAY  AND  THEN  TAKE  1  TABLET  THE  REST  OF  THE  WEEK    Review of Systems  Constitutional: Negative.  HENT: Negative.   Eyes: Negative.   Respiratory: Negative.   Cardiovascular: Negative.   Gastrointestinal: Negative.   Endocrine: Negative.   Genitourinary: Negative.   Musculoskeletal: Negative.   Skin: Negative.   Allergic/Immunologic: Negative.   Neurological: Negative.   Hematological: Negative.   Psychiatric/Behavioral: Negative.        Objective:   Physical Exam  Constitutional: He is oriented to person, place, and time. He appears well-developed and well-nourished.  The patient is pleasant and overweight.  HENT:    Head: Normocephalic and atraumatic.  Right Ear: External ear normal.  Left Ear: External ear normal.  Mouth/Throat: Oropharynx is clear and moist. No oropharyngeal exudate.  Minimal nasal congestion bilaterally  Eyes: Conjunctivae and EOM are normal. Pupils are equal, round, and reactive to light. Right eye exhibits no discharge. Left eye exhibits no discharge. No scleral icterus.  Neck: Normal range of motion. Neck supple. No thyromegaly present.  No anterior cervical adenopathy or thyroid enlargement.  Cardiovascular: Normal rate, normal heart sounds and intact distal pulses.  Exam reveals no gallop and no friction rub.   No murmur heard. The heart is irregular irregular at 60/m.  Pulmonary/Chest: Effort normal. No respiratory distress. He has no wheezes. He has no rales. He exhibits no tenderness.  There is no axillary adenopathy and the breath sounds were distant without wheezing  Abdominal: Soft. Bowel sounds are normal. He exhibits no mass. There is no tenderness. There is no rebound and no guarding.  No masses or tenderness. Morbid obesity  Musculoskeletal: Normal range of motion. He exhibits no edema or tenderness.  There are arthritic changes in the toes of both feet. There is no edema.  Lymphadenopathy:    He has no cervical adenopathy.  Neurological: He is alert and oriented to person, place, and time. He has normal reflexes. No cranial nerve deficit.  Skin: Skin is warm and dry. No rash noted. No erythema. No pallor.  Psychiatric: He has a normal mood and affect. His behavior is normal. Judgment and thought content normal.  Nursing note and vitals reviewed.  BP 147/88 mmHg  Pulse 96  Temp(Src) 97.8 F (36.6 C) (Oral)  Ht $R'5\' 9"'Od$  (1.753 m)  Wt 337 lb (152.862 kg)  BMI 49.74 kg/m2  It is important to note that the patient forgot to take his blood pressure medicine this morning      Assessment & Plan:  1. FIBRILLATION, ATRIAL - POCT INR - POCT CBC  2. 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 Vitamin D deficiency - POCT CBC - Vit D  25 hydroxy (rtn osteoporosis monitoring)  4. Hypertension - POCT CBC - BMP8+EGFR - Hepatic function panel  5. Type 2 diabetes mellitus with complication - POCT CBC - POCT glycosylated hemoglobin (Hb A1C)  6. Hyperlipemia - POCT CBC - NMR, lipoprofile  Meds ordered this encounter  Medications  . simvastatin (ZOCOR) 40 MG tablet    Sig: Take 1 tablet (40 mg total) by mouth daily.    Dispense:  90 tablet    Refill:  3  . metFORMIN (GLUCOPHAGE) 500 MG tablet    Sig: TAKE ONE TABLET BY MOUTH IN THE MORNING AND TWO IN THE EVENING    Dispense:  270 tablet    Refill:  3  . metoprolol (LOPRESSOR) 100 MG tablet    Sig: Take 1 tablet (100 mg total) by mouth 2 (two) times daily.    Dispense:  180 tablet    Refill:  3   Patient Instructions                       Medicare Annual Wellness Visit  [facility] and the medical providers at [facility] strive to bring you the best medical care.  In doing so we not only want to address your current medical conditions and concerns but also to detect new conditions early and prevent illness, disease and health-related problems.    Medicare offers a yearly Wellness Visit which allows our clinical staff to assess your need for preventative services including immunizations, lifestyle education, counseling to decrease risk of preventable diseases and screening for fall risk and other medical concerns.    This visit is provided free of charge (no copay) for all Medicare recipients. The clinical pharmacists at [facility] have begun to conduct these Wellness Visits which will also include a thorough review of all your medications.    As you primary medical provider recommend that you make an appointment for your Annual Wellness Visit if you have not done so  already this year.  You may set up this appointment before you leave today or you may call back ([phone]) and schedule an appointment.  Please make sure when you call that you mention that you are scheduling your Annual Wellness Visit with the clinical pharmacist so that the appointment may be made for the proper length of time.     Continue current medications. Continue good therapeutic lifestyle changes which include good diet and exercise. Fall precautions discussed with patient. If an FOBT was given today- please return it to our front desk. If you are over 52 years old - you may need Prevnar 22 or the adult Pneumonia vaccine.  Flu Shots will be available at our office starting mid- September. Please call and schedule a FLU CLINIC APPOINTMENT.   Continue follow-up visits with pulmonology, oncology, and cardiology. Remember to take her blood pressure medication on a regular basis Try to walk and exercise more and drink plenty of water.  Continue Mucinex.    Arrie Senate MD

## 2014-04-03 NOTE — Patient Instructions (Addendum)
Medicare Annual Wellness Visit  [facility] and the medical providers at [facility] strive to bring you the best medical care.  In doing so we not only want to address your current medical conditions and concerns but also to detect new conditions early and prevent illness, disease and health-related problems.    Medicare offers a yearly Wellness Visit which allows our clinical staff to assess your need for preventative services including immunizations, lifestyle education, counseling to decrease risk of preventable diseases and screening for fall risk and other medical concerns.    This visit is provided free of charge (no copay) for all Medicare recipients. The clinical pharmacists at [facility] have begun to conduct these Wellness Visits which will also include a thorough review of all your medications.    As you primary medical provider recommend that you make an appointment for your Annual Wellness Visit if you have not done so already this year.  You may set up this appointment before you leave today or you may call back ([phone]) and schedule an appointment.  Please make sure when you call that you mention that you are scheduling your Annual Wellness Visit with the clinical pharmacist so that the appointment may be made for the proper length of time.     Continue current medications. Continue good therapeutic lifestyle changes which include good diet and exercise. Fall precautions discussed with patient. If an FOBT was given today- please return it to our front desk. If you are over 32 years old - you may need Prevnar 65 or the adult Pneumonia vaccine.  Flu Shots will be available at our office starting mid- September. Please call and schedule a FLU CLINIC APPOINTMENT.   Continue follow-up visits with pulmonology, oncology, and cardiology. Remember to take her blood pressure medication on a regular basis Try to walk and  exercise more and drink plenty of water.  Continue Mucinex.   Anticoagulation Dose Instructions as of 04/03/2014      Dorene Grebe Tue Wed Thu Fri Sat   New Dose 5 mg 7.5 mg 5 mg 5 mg 5 mg 7.5 mg 5 mg    Description        Continue warfarin 7.5mg  mondays and fridays and 5mg  all other days.     INR was 3.0 today

## 2014-04-04 LAB — NMR, LIPOPROFILE
Cholesterol: 149 mg/dL (ref 100–199)
HDL CHOLESTEROL BY NMR: 40 mg/dL (ref >39)
HDL Particle Number: 32.4 umol/L (ref >=30.5)
LDL Particle Number: 1071 nmol/L — ABNORMAL HIGH (ref <1000)
LDL Size: 19.7 nm (ref >20.5)
LDL-C: 62 mg/dL (ref 0–99)
LP-IR SCORE: 63 — AB (ref <=45)
Small LDL Particle Number: 848 nmol/L — ABNORMAL HIGH (ref <=527)
Triglycerides by NMR: 233 mg/dL — ABNORMAL HIGH (ref 0–149)

## 2014-04-04 LAB — VITAMIN D 25 HYDROXY (VIT D DEFICIENCY, FRACTURES): Vit D, 25-Hydroxy: 36.2 ng/mL (ref 30.0–100.0)

## 2014-04-04 LAB — BMP8+EGFR
BUN/Creatinine Ratio: 22 (ref 10–22)
BUN: 22 mg/dL (ref 8–27)
CO2: 29 mmol/L (ref 18–29)
CREATININE: 0.99 mg/dL (ref 0.76–1.27)
Calcium: 8.9 mg/dL (ref 8.6–10.2)
Chloride: 99 mmol/L (ref 97–108)
GFR calc Af Amer: 93 mL/min/{1.73_m2} (ref >59)
GFR calc non Af Amer: 81 mL/min/{1.73_m2} (ref >59)
GLUCOSE: 109 mg/dL — AB (ref 65–99)
Potassium: 4.1 mmol/L (ref 3.5–5.2)
Sodium: 143 mmol/L (ref 134–144)

## 2014-04-04 LAB — HEPATIC FUNCTION PANEL
ALBUMIN: 4.1 g/dL (ref 3.6–4.8)
ALT: 12 IU/L (ref 0–44)
AST: 16 IU/L (ref 0–40)
Alkaline Phosphatase: 63 IU/L (ref 39–117)
BILIRUBIN TOTAL: 1 mg/dL (ref 0.0–1.2)
Bilirubin, Direct: 0.21 mg/dL (ref 0.00–0.40)
TOTAL PROTEIN: 6.5 g/dL (ref 6.0–8.5)

## 2014-04-07 ENCOUNTER — Telehealth: Payer: Self-pay | Admitting: *Deleted

## 2014-04-07 ENCOUNTER — Ambulatory Visit (HOSPITAL_BASED_OUTPATIENT_CLINIC_OR_DEPARTMENT_OTHER): Payer: Commercial Managed Care - HMO | Attending: Internal Medicine | Admitting: Radiology

## 2014-04-07 VITALS — Ht 70.0 in | Wt 338.0 lb

## 2014-04-07 DIAGNOSIS — G4719 Other hypersomnia: Secondary | ICD-10-CM

## 2014-04-07 DIAGNOSIS — G4733 Obstructive sleep apnea (adult) (pediatric): Secondary | ICD-10-CM | POA: Insufficient documentation

## 2014-04-07 NOTE — Telephone Encounter (Signed)
-----   Message from Chipper Herb, MD sent at 04/04/2014  1:48 PM EST ----- The blood sugar is slightly elevated at 109. The creatinine, the most important kidney function test is within normal limits. The electrolytes including potassium are within normal limits. All liver function tests are within normal limits Cholesterol numbers with advanced lipid testing are more elevated than when previously checked. The LDL particle number is elevated at 1071. This is only slightly elevated above the goal 1000. The LDL C is good at 62 the triglycerides are very elevated at 233. Please, encourage the patient to do better with his diet regimen and exercise regimen. This is very important. Drink more water. Exercise more. Continue current dose of cholesterol medicine The vitamin D level is good at 36.2----increased vitamin D 3, to 2000 daily.

## 2014-04-07 NOTE — Telephone Encounter (Signed)
Aware of results. 

## 2014-04-12 DIAGNOSIS — G4719 Other hypersomnia: Secondary | ICD-10-CM

## 2014-04-12 DIAGNOSIS — G4733 Obstructive sleep apnea (adult) (pediatric): Secondary | ICD-10-CM

## 2014-04-12 NOTE — Sleep Study (Signed)
   NAME: Justin Ruiz DATE OF BIRTH:  [birth year] MEDICAL RECORD NUMBER [id number]  LOCATION: [facility]  PHYSICIAN: YOUNG,CLINTON D  DATE OF STUDY: 04/07/2014  SLEEP STUDY TYPE: Nocturnal Polysomnogram               REFERRING PHYSICIAN: Baird Lyons D, MD  INDICATION FOR STUDY: hypersomnia with sleep apnea  EPWORTH SLEEPINESS SCORE:  10/24 HEIGHT: 5\' 10"  (177.8 cm)  WEIGHT: (!) 338 lb (153.316 kg)    Body mass index is 48.5 kg/(m^2).  NECK SIZE: 18.5 in.  MEDICATIONS: charted for review  SLEEP ARCHITECTURE: split study protocol. During the diagnostic phase, total sleep time 123 minutes with sleep efficiency 80.1%. Stage I was 32.9%, stage II 67.1%, stage III and REM were absent. Sleep latency 14 minutes, awake after sleep onset 16.5 minutes, arousal index 80.5, bedtime medication: None  RESPIRATORY DATA: apnea hypopnea index (AHI) 75.1 per hour. 154 total events scored including 11 obstructive apneas, 1 mixed apnea, 142 hypopneas. Events were not positional. CPAP was titrated 16 CWP, AHI 0 per hour. He wore a small fullface mask.  OXYGEN DATA: mild to moderately loud snoring before CPAP. Persistent low oxygen saturation in the 80-90% range on room air persisted despite CPAP. Supplemental oxygen was added at 1 L/m at 4:48 AM and then increased to 2 L/m at 5:05 AM with CPAP in place.  CARDIAC DATA: atrial fibrillation with mean heart rate 76.1/m  MOVEMENT/PARASOMNIA: no significant movement disturbance, no bathroom trips  IMPRESSION/ RECOMMENDATION:   1) Severe obstructive sleep apnea/hypopnea syndrome, AHI 75.1 per hour, wth non-positional events. Mild to moderately loud snoring with oxygen desaturation to a nadir of 79% and mean saturation 85.8% on room air. 2) room air oxygen saturation on arrival while awake was 90%. Because of persistent low oxygen saturation between 80 and 90% on room air throughout the study, despite CPAP, supplemental oxygen was added at  1 L/m at 4:48 AM at increased 2 L/m at 5:05 AM. 3) Successful CPAP titration to 16 CWP, AHI 0 per hour. He wore a small [employer] Simplus full face mask with heated humidifier and an EPR of 2. Supplemental oxygen was added as above.  Deneise Lever Diplomate, American Board of Sleep Medicine  ELECTRONICALLY SIGNED ON:  04/12/2014, 12:15 PM [place] PH: [phone]   FX: [phone] [place]

## 2014-04-15 ENCOUNTER — Encounter (HOSPITAL_COMMUNITY): Payer: Self-pay | Admitting: *Deleted

## 2014-04-16 ENCOUNTER — Encounter (HOSPITAL_COMMUNITY): Payer: Self-pay | Admitting: *Deleted

## 2014-04-16 NOTE — Progress Notes (Signed)
04-16-14 1000 Spoke with Dr. Landry Dyke, anesthesiologist-updated on pt history and Coumadin use- okay to proceed as planned with Dr. Kelby Fam office advisement on Coumadin use.

## 2014-04-21 ENCOUNTER — Ambulatory Visit: Payer: Commercial Managed Care - HMO | Admitting: Internal Medicine

## 2014-05-05 ENCOUNTER — Encounter (HOSPITAL_COMMUNITY): Payer: Self-pay | Admitting: *Deleted

## 2014-05-05 ENCOUNTER — Ambulatory Visit (HOSPITAL_COMMUNITY)
Admission: RE | Admit: 2014-05-05 | Discharge: 2014-05-05 | Disposition: A | Discharge status: Home / Self Care | Payer: Commercial Managed Care - HMO | Source: Ambulatory Visit | Attending: Gastroenterology | Admitting: Gastroenterology

## 2014-05-05 ENCOUNTER — Encounter (HOSPITAL_COMMUNITY)
Admission: RE | Disposition: A | Discharge status: Home / Self Care | Payer: Self-pay | Source: Ambulatory Visit | Attending: Gastroenterology

## 2014-05-05 ENCOUNTER — Ambulatory Visit (HOSPITAL_COMMUNITY): Payer: Commercial Managed Care - HMO | Admitting: Anesthesiology

## 2014-05-05 DIAGNOSIS — Z860101 Personal history of adenomatous and serrated colon polyps: Secondary | ICD-10-CM

## 2014-05-05 DIAGNOSIS — Z8572 Personal history of non-Hodgkin lymphomas: Secondary | ICD-10-CM | POA: Insufficient documentation

## 2014-05-05 DIAGNOSIS — Z86010 Personal history of colonic polyps: Secondary | ICD-10-CM

## 2014-05-05 DIAGNOSIS — Z800 Family history of malignant neoplasm of digestive organs: Secondary | ICD-10-CM

## 2014-05-05 DIAGNOSIS — Z7901 Long term (current) use of anticoagulants: Secondary | ICD-10-CM | POA: Diagnosis not present

## 2014-05-05 DIAGNOSIS — J441 Chronic obstructive pulmonary disease with (acute) exacerbation: Secondary | ICD-10-CM

## 2014-05-05 DIAGNOSIS — D124 Benign neoplasm of descending colon: Secondary | ICD-10-CM | POA: Insufficient documentation

## 2014-05-05 DIAGNOSIS — I10 Essential (primary) hypertension: Secondary | ICD-10-CM | POA: Diagnosis not present

## 2014-05-05 DIAGNOSIS — Z833 Family history of diabetes mellitus: Secondary | ICD-10-CM | POA: Diagnosis not present

## 2014-05-05 DIAGNOSIS — G4730 Sleep apnea, unspecified: Secondary | ICD-10-CM | POA: Diagnosis not present

## 2014-05-05 DIAGNOSIS — Z79899 Other long term (current) drug therapy: Secondary | ICD-10-CM | POA: Insufficient documentation

## 2014-05-05 DIAGNOSIS — Z1211 Encounter for screening for malignant neoplasm of colon: Secondary | ICD-10-CM

## 2014-05-05 DIAGNOSIS — J45909 Unspecified asthma, uncomplicated: Secondary | ICD-10-CM | POA: Diagnosis not present

## 2014-05-05 DIAGNOSIS — D122 Benign neoplasm of ascending colon: Secondary | ICD-10-CM

## 2014-05-05 DIAGNOSIS — Z8371 Family history of colonic polyps: Secondary | ICD-10-CM | POA: Diagnosis not present

## 2014-05-05 DIAGNOSIS — Z6841 Body Mass Index (BMI) 40.0 and over, adult: Secondary | ICD-10-CM | POA: Insufficient documentation

## 2014-05-05 DIAGNOSIS — Z87891 Personal history of nicotine dependence: Secondary | ICD-10-CM | POA: Diagnosis not present

## 2014-05-05 DIAGNOSIS — Z09 Encounter for follow-up examination after completed treatment for conditions other than malignant neoplasm: Secondary | ICD-10-CM | POA: Diagnosis present

## 2014-05-05 DIAGNOSIS — Z794 Long term (current) use of insulin: Secondary | ICD-10-CM | POA: Insufficient documentation

## 2014-05-05 DIAGNOSIS — I4891 Unspecified atrial fibrillation: Secondary | ICD-10-CM | POA: Insufficient documentation

## 2014-05-05 DIAGNOSIS — E785 Hyperlipidemia, unspecified: Secondary | ICD-10-CM | POA: Diagnosis not present

## 2014-05-05 DIAGNOSIS — K219 Gastro-esophageal reflux disease without esophagitis: Secondary | ICD-10-CM | POA: Insufficient documentation

## 2014-05-05 DIAGNOSIS — K635 Polyp of colon: Secondary | ICD-10-CM | POA: Diagnosis not present

## 2014-05-05 DIAGNOSIS — E119 Type 2 diabetes mellitus without complications: Secondary | ICD-10-CM | POA: Diagnosis not present

## 2014-05-05 HISTORY — DX: Benign neoplasm of ascending colon: D12.2

## 2014-05-05 HISTORY — DX: Sleep apnea, unspecified: G47.30

## 2014-05-05 HISTORY — PX: COLONOSCOPY WITH PROPOFOL: SHX5780

## 2014-05-05 LAB — GLUCOSE, CAPILLARY: GLUCOSE-CAPILLARY: 264 mg/dL — AB (ref 70–99)

## 2014-05-05 SURGERY — COLONOSCOPY WITH PROPOFOL
Anesthesia: Monitor Anesthesia Care

## 2014-05-05 MED ORDER — PROPOFOL INFUSION 10 MG/ML OPTIME
INTRAVENOUS | Status: DC | PRN
  Administered 2014-05-05: 200 ug/kg/min via INTRAVENOUS

## 2014-05-05 MED ORDER — INSULIN ASPART 100 UNIT/ML ~~LOC~~ SOLN
0.0000 [IU] | SUBCUTANEOUS | Status: DC
  Administered 2014-05-05: 8 [IU] via SUBCUTANEOUS
  Filled 2014-05-05 (×12): qty 0.15

## 2014-05-05 MED ORDER — SODIUM CHLORIDE 0.9 % IV SOLN: INTRAVENOUS | Status: DC

## 2014-05-05 MED ORDER — KETAMINE HCL 10 MG/ML IJ SOLN
INTRAMUSCULAR | Status: DC | PRN
  Administered 2014-05-05: 5 mg via INTRAVENOUS
  Administered 2014-05-05: 10 mg via INTRAVENOUS
  Administered 2014-05-05: 5 mg via INTRAVENOUS

## 2014-05-05 MED ORDER — PROPOFOL 10 MG/ML IV BOLUS
INTRAVENOUS | Status: AC
  Filled 2014-05-05: qty 20

## 2014-05-05 MED ORDER — ONDANSETRON HCL 4 MG/2ML IJ SOLN
INTRAMUSCULAR | Status: DC | PRN
  Administered 2014-05-05: 4 mg via INTRAVENOUS

## 2014-05-05 MED ORDER — GLYCOPYRROLATE 0.2 MG/ML IJ SOLN
INTRAMUSCULAR | Status: AC
  Filled 2014-05-05: qty 1

## 2014-05-05 MED ORDER — LIDOCAINE HCL (CARDIAC) 20 MG/ML IV SOLN
INTRAVENOUS | Status: AC
  Filled 2014-05-05: qty 5

## 2014-05-05 MED ORDER — LIDOCAINE HCL (CARDIAC) 20 MG/ML IV SOLN
INTRAVENOUS | Status: DC | PRN
  Administered 2014-05-05: 50 mg via INTRAVENOUS

## 2014-05-05 MED ORDER — METOCLOPRAMIDE HCL 5 MG/ML IJ SOLN
INTRAMUSCULAR | Status: AC
  Filled 2014-05-05: qty 2

## 2014-05-05 MED ORDER — LACTATED RINGERS IV SOLN: INTRAVENOUS | Status: DC

## 2014-05-05 MED ORDER — METOCLOPRAMIDE HCL 5 MG/ML IJ SOLN
INTRAMUSCULAR | Status: DC | PRN
  Administered 2014-05-05: 10 mg via INTRAVENOUS

## 2014-05-05 MED ORDER — FENTANYL CITRATE 0.05 MG/ML IJ SOLN: 25.0000 ug | INTRAMUSCULAR | Status: DC | PRN

## 2014-05-05 MED ORDER — ESMOLOL HCL 10 MG/ML IV SOLN
INTRAVENOUS | Status: AC
  Filled 2014-05-05: qty 10

## 2014-05-05 MED ORDER — GLYCOPYRROLATE 0.2 MG/ML IJ SOLN
INTRAMUSCULAR | Status: DC | PRN
  Administered 2014-05-05 (×2): 0.1 mg via INTRAVENOUS

## 2014-05-05 MED ORDER — ESMOLOL HCL 10 MG/ML IV SOLN
INTRAVENOUS | Status: DC | PRN
  Administered 2014-05-05 (×2): 5 mg via INTRAVENOUS

## 2014-05-05 MED ORDER — LACTATED RINGERS IV SOLN
INTRAVENOUS | Status: DC
  Administered 2014-05-05: 1000 mL via INTRAVENOUS

## 2014-05-05 MED ORDER — PROPOFOL 10 MG/ML IV BOLUS
INTRAVENOUS | Status: DC | PRN
  Administered 2014-05-05: 30 mg via INTRAVENOUS
  Administered 2014-05-05: 10 mg via INTRAVENOUS
  Administered 2014-05-05: 20 mg via INTRAVENOUS

## 2014-05-05 MED ORDER — ONDANSETRON HCL 4 MG/2ML IJ SOLN
INTRAMUSCULAR | Status: AC
  Filled 2014-05-05: qty 2

## 2014-05-05 MED ORDER — PROPOFOL 10 MG/ML IV BOLUS
INTRAVENOUS | Status: AC
Start: 2014-05-05 — End: 2014-05-05
  Filled 2014-05-05: qty 20

## 2014-05-05 SURGICAL SUPPLY — 22 items

## 2014-05-05 NOTE — Anesthesia Postprocedure Evaluation (Signed)
  Anesthesia Post-op Note  Patient: Justin Ruiz  Procedure(s) Performed: Procedure(s) (LRB): COLONOSCOPY WITH PROPOFOL (N/A)  Patient Location: PACU  Anesthesia Type: MAC  Level of Consciousness: awake and alert   Airway and Oxygen Therapy: Patient Spontanous Breathing  Post-op Pain: mild  Post-op Assessment: Post-op Vital signs reviewed, Patient's Cardiovascular Status Stable, Respiratory Function Stable, Patent Airway and No signs of Nausea or vomiting  Last Vitals:  Filed Vitals:   05/05/14 1050  BP:   Pulse: 91  Temp:   Resp: 16    Post-op Vital Signs: stable   Complications: No apparent anesthesia complications

## 2014-05-05 NOTE — Transfer of Care (Signed)
Immediate Anesthesia Transfer of Care Note  Patient: Justin Ruiz  Procedure(s) Performed: Procedure(s): COLONOSCOPY WITH PROPOFOL (N/A)  Patient Location: Endo Recovery  Anesthesia Type:MAC  Level of Consciousness: Patient easily awoken, sedated, comfortable, cooperative, following commands, responds to stimulation.   Airway & Oxygen Therapy: Patient spontaneously breathing, ventilating well, oxygen via simple oxygen mask.  Post-op Assessment: Report given to PACU RN, vital signs reviewed and stable, moving all extremities.   Post vital signs: Reviewed and stable.  Complications: No apparent anesthesia complications

## 2014-05-05 NOTE — Anesthesia Preprocedure Evaluation (Addendum)
Anesthesia Evaluation  Patient identified by MRN, date of birth, ID band Patient awake    Reviewed: Allergy & Precautions, H&P , NPO status , Patient's Chart, lab work & pertinent test results, reviewed documented beta blocker date and time   Airway Mallampati: III  TM Distance: >3 FB Neck ROM: full    Dental  (+) Edentulous Upper, Edentulous Lower, Dental Advisory Given   Pulmonary asthma , sleep apnea , COPD COPD inhaler, former smoker,  breath sounds clear to auscultation  Pulmonary exam normal       Cardiovascular Exercise Tolerance: Good hypertension, On Home Beta Blockers and On Medications + dysrhythmias Atrial Fibrillation Rhythm:Irregular Rate:Normal  ECG - AF   Neuro/Psych negative neurological ROS  negative psych ROS   GI/Hepatic negative GI ROS, Neg liver ROS,   Endo/Other  diabetes, Well Controlled, Type 2, Insulin Dependent, Oral Hypoglycemic AgentsMorbid obesity  Renal/GU negative Renal ROS  negative genitourinary   Musculoskeletal   Abdominal (+) + obese,   Peds  Hematology negative hematology ROS (+) Non-Hodgkins lymphoma   Anesthesia Other Findings   Reproductive/Obstetrics negative OB ROS                            Anesthesia Physical Anesthesia Plan  ASA: IV  Anesthesia Plan: MAC   Post-op Pain Management:    Induction:   Airway Management Planned:   Additional Equipment:   Intra-op Plan:   Post-operative Plan:   Informed Consent: I have reviewed the patients History and Physical, chart, labs and discussed the procedure including the risks, benefits and alternatives for the proposed anesthesia with the patient or authorized representative who has indicated his/her understanding and acceptance.   Dental Advisory Given  Plan Discussed with: CRNA and Surgeon  Anesthesia Plan Comments:         Anesthesia Quick Evaluation

## 2014-05-05 NOTE — Op Note (Signed)
[facility] [place], [id number]   COLONOSCOPY PROCEDURE REPORT  PATIENT: Justin Ruiz, Justin Ruiz  MR#: [id number] BIRTHDATE: 04-Oct-1950 , 65  yrs. old GENDER: male ENDOSCOPIST: Inda Castle, MD REFERRED FS:FSELTR Laurance Flatten, M.D. PROCEDURE DATE:  05/05/2014 PROCEDURE:   Colonoscopy with snare polypectomy and Colonoscopy with cold biopsy polypectomy First Screening Colonoscopy - Avg.  risk and is 50 yrs.  old or older - No.  Prior Negative Screening - Now for repeat screening. N/A  History of Adenoma - Now for follow-up colonoscopy & has been > or = to 3 yrs.  Yes hx of adenoma.  Has been 3 or more years since last colonoscopy.  Polyps Removed Today? Yes. ASA CLASS:   Class III INDICATIONS:high risk personal history of colonic polyps and patient's immediate family history of colon cancer. 2010; MEDICATIONS: Monitored anesthesia care  DESCRIPTION OF PROCEDURE:   After the risks benefits and alternatives of the procedure were thoroughly explained, informed consent was obtained.  The digital rectal exam revealed no abnormalities of the rectum.   The [id number] ([id number])  endoscope was introduced through the anus and advanced to the cecum, which was identified by both the appendix and ileocecal valve. No adverse events experienced.   The quality of the prep was Suprep good  The instrument was then slowly withdrawn as the colon was fully examined.      COLON FINDINGS: A sessile polyp measuring 4 mm in size was found in the ascending colon.  A polypectomy was performed with a cold snare.  The resection was complete, the polyp tissue was completely retrieved and sent to histology.   A sessile polyp measuring 2 mm in size was found in the descending colon.  A polypectomy was performed with cold forceps.   The examination was otherwise normal.  Retroflexed views revealed no abnormalities. The time to cecum=  .  Withdrawal time=8 minutes 0 seconds.  The scope  was withdrawn and the procedure completed. COMPLICATIONS: There were no immediate complications.  ENDOSCOPIC IMPRESSION: 1.   Sessile polyp was found in the ascending colon; polypectomy was performed with a cold snare 2.   Sessile polyp was found in the descending colon; polypectomy was performed with cold forceps 3.   The examination was otherwise normal  RECOMMENDATIONS: 1.  Given your significant family history of colon cancer, you should have a repeat colonoscopy in 5 years 2.  Resume Coumadin in a.m.  eSigned:  Inda Castle, MD 05/05/2014 10:26 AM   cc:   PATIENT NAME:  Tracy, Kinner MR#: [id number]

## 2014-05-05 NOTE — H&P (Signed)
Patient ID: Justin Ruiz, male DOB: 20-May-1950, 64 y.o. MRN: [id number]  HPI Chavez is a pleasant 64 year old white male known previously to Dr. Deatra Ina from prior colonoscopies. He comes in today to discuss follow-up colonoscopy. He is maintained on chronic Coumadin for atrial fibrillation. Last colonoscopy was done in November 2010 with finding of 3 polyps largest was 5 mm. These were all removed 2 were tubular adenomas and one hyperplastic. Patient does have family history of colon cancer in his father. He denies any current GI complaints. He says occasionally he has problems with hemorrhoids and may see a small amount of blood. Patient has history of COPD and asthma did have a recent exacerbation with an upper respiratory infection. He says he is feeling better at this point. He has oxygen at home to use as needed in general uses it at night. He also has history of lymphoma. He completed chemotherapy about 3 years ago and is due for follow-up imaging for restaging later in November. Other medical problems include adult-onset diabetes mellitus, hyperlipidemia, morbid obesity, hypertension,. He has not had any recent cardiac issues and is followed by Dr. Oran Rein run    Review of Systems  Constitutional: Negative.  HENT: Negative.  Eyes: Negative.  Respiratory: Positive for shortness of breath.  Cardiovascular: Negative.  Gastrointestinal: Negative.  Endocrine: Negative.  Musculoskeletal: Negative.  Skin: Negative.  Allergic/Immunologic: Negative.  Neurological: Negative.  Hematological: Negative.  Psychiatric/Behavioral: Negative.   Outpatient Prescriptions Prior to Visit  Medication Sig Dispense Refill  . albuterol (PROAIR HFA) 108 (90 BASE) MCG/ACT inhaler Inhale 2 puffs into the lungs every 6 (six) hours as needed for wheezing or shortness of breath. 8.5 g 5  . ALPRAZolam (XANAX) 0.25 MG tablet Take 1 tablet (0.25 mg total) by mouth at bedtime as  needed. 30 tablet 2  . Blood Glucose Calibration (TAI DOC CONTROL) NORMAL SOLN     . Blood Glucose Monitoring Suppl (CLEVER CHEK AUTO-CODE VOICE) DEVI USE AS DIRECTED 1 each 2  . budesonide-formoterol (SYMBICORT) 160-4.5 MCG/ACT inhaler Inhale 2 puffs into the lungs 2 (two) times daily. 1 Inhaler 3  . Cholecalciferol (VITAMIN D) 1000 UNITS capsule Take 5,000 Units by mouth daily.    Marland Kitchen CLEVER CHEK AUTO-CODE VOICE test strip USE TWICE DAILY AS DIRECTED FOR GLUCOSE TESTING AND MONITORING 200 each 2  . enalapril (VASOTEC) 20 MG tablet TAKE ONE TABLET BY MOUTH ONCE DAILY 30 tablet 4  . insulin NPH Human (NOVOLIN N RELION) 100 UNIT/ML injection Inject 0.32 mLs (32 Units total) into the skin 2 (two) times daily before a meal. 40 mL 2  . insulin regular (NOVOLIN R RELION) 100 units/mL injection Inject 0.32 mLs (32 Units total) into the skin 2 (two) times daily before a meal. 40 mL 2  . ipratropium-albuterol (DUONEB) 0.5-2.5 (3) MG/3ML SOLN Take 3 mLs by nebulization every 6 (six) hours as needed. 75 mL prn  . Lancet Devices (LANCING DEVICE) MISC USE AS DIRECTED 1 each 2  . metFORMIN (GLUCOPHAGE) 500 MG tablet TAKE ONE TABLET BY MOUTH IN THE MORNING AND TWO IN THE EVENING 270 tablet 3  . metoprolol (LOPRESSOR) 100 MG tablet Take 1 tablet (100 mg total) by mouth 2 (two) times daily. 180 tablet 3  . PHARMACIST CHOICE LANCETS MISC     . simvastatin (ZOCOR) 80 MG tablet Take 40 mg by mouth daily.     Marland Kitchen warfarin (COUMADIN) 5 MG tablet 7.29m Monday and Friday: 548mthe rest of the wek.  45 tablet 6   No facility-administered medications prior to visit.   Allergies  Allergen Reactions  . Avapro [Irbesartan]   . Lipitor [Atorvastatin Calcium] Other (See Comments)    Leg pain   Patient Active Problem List   Diagnosis Date Noted  . Excessive daytime sleepiness 01/24/2014  . Mediastinal adenopathy 06/20/2013   . Severe obesity (BMI >= 40) 05/01/2013  . Generalized anxiety disorder 05/01/2013  . Lung nodules 03/11/2013  . Chronic anticoagulation 08/20/2012  . FIBRILLATION, ATRIAL 05/12/2010  . LYMPHOMA 01/26/2010  . OBESITY, UNSPECIFIED 04/27/2009  . CARDIOVASCULAR STUDIES, ABNORMAL 04/27/2009  . ABNORMAL STRESS ELECTROCARDIOGRAM 03/25/2009  . PERSONAL HISTORY OF COLONIC POLYPS 03/02/2009  . DIABETES, TYPE 2 04/29/2007  . HYPERLIPIDEMIA 04/29/2007  . Hypertension 04/29/2007  . Seasonal and perennial allergic rhinitis 04/29/2007  . COPD exacerbation 04/29/2007  . G E R D 04/29/2007  . BENIGN PROSTATIC HYPERTROPHY, HX OF 04/29/2007   History  Substance Use Topics  . Smoking status: Former Smoker -- 2.00 packs/day    Start date: 12/17/1968    Quit date: 11/14/2004  . Smokeless tobacco: Never Used     Comment: 2 ppd   . Alcohol Use: No     Comment: previous   family history includes Aortic aneurysm in his sister; COPD in his sister; Colon cancer in his father; Colon polyps in his father; Diabetes in his maternal grandmother; Heart attack in his sister; Liver cancer in his mother; Prostate cancer in his father; Pulmonary embolism in his sister.    Objective:   Physical Exam well-developed morbidly obese white male in no acute distress, pleasant accompanied by his wife blood pressure 140/92 pulse 88 height 5 foot 9 weight 339, BMI 47. HEENT nontraumatic normocephalic EOMI PERRLA sclera anicteric, Supple; no JVD, Cardiovascular ; irregular rate and rhythm with S1-S2no murmur rub or gallop, Pulmonary, decreased breath sounds bilaterally, Abdomen ;morbidly obese soft nontender nondistended bowel sounds are present and palpable mass or hepatosplenomegaly, Rectal ;exam not done, Extremities; no clubbing cyanosis or edema skin warm and dry, Psych ;mood and affect appropriate        Assessment & Plan:  #35  64 year old male with history of adenomatous colon polyps and family history of colon cancer in patient's father due for follow-up colonoscopy #2 chronic anticoagulation with Coumadin #3 atrial fibrillation #4 morbid obesity BMI 47 #5 COPD with nocturnal O2 use #6 history of asthma #7 hypertension #8 history of lymphoma status post chemotherapy proximally 3 years ago patient plan for follow-up imaging later in November  Plan; colonoscopy.  Coumadin is on hold

## 2014-05-05 NOTE — Discharge Instructions (Signed)
Conscious Sedation, Adult, Care After Refer to this sheet in the next few weeks. These instructions provide you with information on caring for yourself after your procedure. Your health care provider may also give you more specific instructions. Your treatment has been planned according to current medical practices, but problems sometimes occur. Call your health care provider if you have any problems or questions after your procedure. WHAT TO EXPECT AFTER THE PROCEDURE  After your procedure: You may feel sleepy, clumsy, and have poor balance for several hours. Vomiting may occur if you eat too soon after the procedure. HOME CARE INSTRUCTIONS Do not participate in any activities where you could become injured for at least 24 hours. Do not: Drive. Swim. Ride a bicycle. Operate heavy machinery. Cook. Use power tools. Climb ladders. Work from a high place. Do not make important decisions or sign legal documents until you are improved. If you vomit, drink water, juice, or soup when you can drink without vomiting. Make sure you have little or no nausea before eating solid foods. Only take over-the-counter or prescription medicines for pain, discomfort, or fever as directed by your health care provider. Make sure you and your family fully understand everything about the medicines given to you, including what side effects may occur. You should not drink alcohol, take sleeping pills, or take medicines that cause drowsiness for at least 24 hours. If you smoke, do not smoke without supervision. If you are feeling better, you may resume normal activities 24 hours after you were sedated. Keep all appointments with your health care provider. SEEK MEDICAL CARE IF: Your skin is pale or bluish in color. You continue to feel nauseous or vomit. Your pain is getting worse and is not helped by medicine. You have bleeding or swelling. You are still sleepy or feeling clumsy after 24 hours. SEEK IMMEDIATE  MEDICAL CARE IF: You develop a rash. You have difficulty breathing. You develop any type of allergic problem. You have a fever. MAKE SURE YOU: Understand these instructions. Will watch your condition. Will get help right away if you are not doing well or get worse. Document Released: 01/30/2013 Document Reviewed: 01/30/2013 [facility] Patient Information 2015 [place], [place]. This information is not intended to replace advice given to you by your health care provider. Make sure you discuss any questions you have with your health care provider. Colonoscopy, Care After These instructions give you information on caring for yourself after your procedure. Your doctor may also give you more specific instructions. Call your doctor if you have any problems or questions after your procedure. HOME CARE  Do not drive for 24 hours.  Do not sign important papers or use machinery for 24 hours.  You may shower.  You may go back to your usual activities, but go slower for the first 24 hours.  Take rest breaks often during the first 24 hours.  Walk around or use warm packs on your belly (abdomen) if you have belly cramping or gas.  Drink enough fluids to keep your pee (urine) clear or pale yellow.  Resume your normal diet. Avoid heavy or fried foods.  Avoid drinking alcohol for 24 hours or as told by your doctor.  Only take medicines as told by your doctor. If a tissue sample (biopsy) was taken during the procedure:   Do not take aspirin or blood thinners for 7 days, or as told by your doctor.  Do not drink alcohol for 7 days, or as told by your doctor.  Eat  soft foods for the first 24 hours. GET HELP IF: You still have a small amount of blood in your poop (stool) 2-3 days after the procedure. GET HELP RIGHT AWAY IF:  You have more than a small amount of blood in your poop.  You see clumps of tissue (blood clots) in your poop.  Your belly is puffy (swollen).  You feel sick to your  stomach (nauseous) or throw up (vomit).  You have a fever.  You have belly pain that gets worse and medicine does not help. MAKE SURE YOU:  Understand these instructions.  Will watch your condition.  Will get help right away if you are not doing well or get worse. Document Released: 05/14/2010 Document Revised: 04/16/2013 Document Reviewed: 12/17/2012 [facility] Patient Information 2015 [place], [place]. This information is not intended to replace advice given to you by your health care provider. Make sure you discuss any questions you have with your health care provider.

## 2014-05-06 ENCOUNTER — Encounter (HOSPITAL_COMMUNITY): Payer: Self-pay | Admitting: Gastroenterology

## 2014-05-06 ENCOUNTER — Encounter: Payer: Self-pay | Admitting: Gastroenterology

## 2014-05-06 LAB — GLUCOSE, CAPILLARY: GLUCOSE-CAPILLARY: 251 mg/dL — AB (ref 70–99)

## 2014-05-11 DIAGNOSIS — J449 Chronic obstructive pulmonary disease, unspecified: Secondary | ICD-10-CM | POA: Diagnosis not present

## 2014-05-11 DIAGNOSIS — J439 Emphysema, unspecified: Secondary | ICD-10-CM | POA: Diagnosis not present

## 2014-05-14 ENCOUNTER — Ambulatory Visit (INDEPENDENT_AMBULATORY_CARE_PROVIDER_SITE_OTHER): Payer: Medicare HMO | Admitting: Pharmacist

## 2014-05-14 DIAGNOSIS — Z7901 Long term (current) use of anticoagulants: Secondary | ICD-10-CM | POA: Diagnosis not present

## 2014-05-14 DIAGNOSIS — I4819 Other persistent atrial fibrillation: Secondary | ICD-10-CM

## 2014-05-14 DIAGNOSIS — I4891 Unspecified atrial fibrillation: Secondary | ICD-10-CM

## 2014-05-14 DIAGNOSIS — I481 Persistent atrial fibrillation: Secondary | ICD-10-CM | POA: Diagnosis not present

## 2014-05-14 LAB — POCT INR: INR: 2.2

## 2014-05-14 NOTE — Patient Instructions (Signed)
Anticoagulation Dose Instructions as of 05/14/2014      Justin Ruiz Tue Wed Thu Fri Sat   New Dose 5 mg 7.5 mg 5 mg 5 mg 5 mg 7.5 mg 5 mg    Description        Continue warfarin 7.5mg  mondays and fridays and 5mg  all other days.      INR was 2.2 today

## 2014-05-22 ENCOUNTER — Ambulatory Visit (INDEPENDENT_AMBULATORY_CARE_PROVIDER_SITE_OTHER): Payer: Commercial Managed Care - HMO | Admitting: Internal Medicine

## 2014-05-22 ENCOUNTER — Encounter: Payer: Self-pay | Admitting: Internal Medicine

## 2014-05-22 VITALS — BP 124/76 | HR 86 | Ht 71.0 in | Wt 343.8 lb

## 2014-05-22 DIAGNOSIS — J9611 Chronic respiratory failure with hypoxia: Secondary | ICD-10-CM | POA: Diagnosis not present

## 2014-05-22 DIAGNOSIS — C8590 Non-Hodgkin lymphoma, unspecified, unspecified site: Secondary | ICD-10-CM | POA: Diagnosis not present

## 2014-05-22 DIAGNOSIS — G4733 Obstructive sleep apnea (adult) (pediatric): Secondary | ICD-10-CM

## 2014-05-22 DIAGNOSIS — R0609 Other forms of dyspnea: Secondary | ICD-10-CM

## 2014-05-22 NOTE — Progress Notes (Signed)
12/02/11- 62 yoM former smoker followed for COPD, OSA, complicated by hx lymphoma, AFib/ coumadin PFT 11/23/06- 0.47.   Hx 35 pk yr cigs.  LOV- 06/28/10  Patient states doing "pretty good". c/o sob with exertion. denies chest pain, chest tightness, wheezing, and cough. DOE especially on stairs- no change. Uses neb 1-2x/ yr. Seasonal pollens bother some.  OSA status not addressed this visit. COPD Assessment Test( CAT) 16/24  01/09/12-  61 yoM former smoker followed for COPD, OSA, complicated by hx lymphoma, AFib/ coumadin ACUTE VISIT: unable to lay flat-not able to breathe; Increased SOB and wheezing-getting worse; breathing tx at 4am and used rescue inhaler as well; has been going on since yesterday. Wife here On arrival today o2 sat 80, responded to 3 L O2. COPD assessment test (CAT) 40/40. Did well at [facility] on vacation without recognized fluid retention or other acute event. Home over the past week- Gradually worse shortness of breath x2 days with increased cough, watery rhinorrhea. Rattling chest congestion x2 days. Denies fever, pain, green, blood, sore throat. Wife gave antihistamine yesterday. CT chest 11/01/11-reviewed with them IMPRESSION:  1. New airspace opacity in the left lower lobe - pneumonia or  pulmonary hemorrhage could give a similar appearance. Follow-up  chest radiography to ensure clearance is recommended.  2. Several small right lung nodules are stable and nonspecific due  to small size.  3. Mediastinal lymph nodes are slightly increased in size compared  the prior exam, but currently within normal size limits.  4. Atherosclerosis.   01/22/13- 61 yoM former smoker followed for COPD, OSA, complicated by hx lymphoma, AFib/ coumadin pt reports breathing is doing well-- denies any other concerns at this time Schedule for vaccine next week. Denies cough wheeze or active concern. Seasonal rhinitis with watery nose. Rescue inhaler sometimes  once a day, currently 2 or 3 times per  week. Denies any concerns about potential cough relation enalapril-discussed CXR 09/03/12 IMPRESSION:  No acute or metastatic cardiopulmonary abnormality.  rec Refills for rescue inhaler and nebulizer  06/26/2013 pulmonary consultation /[facility]ert re: ? Recurrent lymphoma vs ca Chief Complaint  Patient presents with  . Follow-up    Pt here at the request of Dr. Beryle Beams for eval of PET and possible Bronch. Pt reports having increased DOE for the past month.  doe indolent onset x steps, flat ok - no cough on ACEi  Has duoneb/ saba hfa but hasn't tried them before desired activity   12/09/13-  61 yoM former smoker followed for COPD, OSA, complicated by hx lymphoma, DM , AFib/ coumadin, morbid obesity FOLLOWS FOR: Increased SOB over the past month; wheezing at times, sats dropping in 80's(has noticed this more at night time) He no longer has home oxygen. O2 sat here dropped to 86% at rest standing on room air today on arrival. He had been needing to use nebulizer more regularly for chest tightness before leaving for beach, and has not improved. Denies acute infection, chest pain, andkle edema or bleeding. Using home nebulizer with DuoNeb. Lymphoma being followed by oncology with CT scan. Nodules are reported smaller. CT chest 09/06/13 IMPRESSION:  1. Mediastinal and right hilar lymphadenopathy identified on the  recent PET-CT has decreased in size in the interval since  06/19/2013.  2. Scattered bilateral small pulmonary nodules. 1 of the nodules in  the right middle lobe measures minimally larger on today's study but  the remaining nodules are all stable. No new pulmonary nodule is  evident.  3. Stable small hepatoduodenal  ligament lymph nodes. No change in  the retroperitoneal aortocaval lymph node measured previously  comparing back to the study from 03/04/2013. No new or progressive  nodal disease in the abdomen or pelvis.  Electronically Signed  By: [name] Stanley M.D.  On: 09/06/2013  09:35  *Not clear why he has never had sleep study- first priority is to address O2 needs, but will need to address OSA next visit* Also needs update PFT  01/24/2014 Follow up  Returns for 6 week follow up for COPD flare  Tx w/ Depo Medrol shot last ov and started on BREO . Does not want to continue BREO says he does not feel he needs it.  Has duoneb at home but does not use it.  No recent PFt  Started on O2 last ov for persistent desats.  Pt has CT chest next month for follow up for known lung nodules.  Says he is feeling much better , stopped his Oxygen . We  Discussed need to wear at all times. O2 sats 83% on RA on arrival to office , at rest 90% on RA . Of note on ACEI, denies cough.  Has daytime sleepiness, snoring , never had a sleep study in past.   05/22/14-63 yoM former smoker followed for COPD/ chronic hypoxic resp failure, OSA, complicated by hx lymphoma, DM , AFib/ coumadin, morbid obesity FOLLOWS FOR: Review sleep study with patient. NPSG-  04/07/2014-severe obstructive sleep apnea-AHI 75.1 per hour. CPAP titrated to 16. He required supplemental oxygen at 2 L/ [employer] which he already wears for sleep at home. Weight 338 pounds Dr. Earlie Server continues following for oncology/lymphoma CT chest 03/17/14-images reviewed IMPRESSION:  CT CHEST IMPRESSION  1. Similar to slight improvement in subpleural pulmonary nodules,  favored to represent benign subpleural lymph nodes. The right middle  lobe nodule described on the prior exam has resolved.  2. No acute process or evidence of active lymphoma within the chest.  3. Atherosclerosis, including within the coronary arteries.  CT ABDOMEN AND PELVIS IMPRESSION  1. Similar retrocaval adenopathy.  2. No evidence of new or progressive adenopathy within the abdomen  or pelvis.  3. Cholelithiasis.  Electronically Signed  By: Abigail Miyamoto M.D.  On: 03/17/2014 11:14  ROS-see HPI Constitutional:   No-   weight loss, night sweats, fevers,  chills, +fatigue, lassitude. HEENT:   No-  headaches, difficulty swallowing, tooth/dental problems, sore throat,       No-  sneezing, itching, ear ache, nasal congestion, post nasal drip,  CV:  No-   chest pain, orthopnea, PND, swelling in lower extremities, anasarca,                                  dizziness, palpitations Resp: + shortness of breath with exertion or at rest.              No-   productive cough,  No non-productive cough,  No- coughing up of blood.              No-   change in color of mucus.   Skin: No-   rash or lesions. GI:  No-   heartburn, indigestion, abdominal pain, nausea, vomiting,  GU:  MS:  No-   joint pain or swelling.  . Neuro-     nothing unusual Psych:  No- change in mood or affect. No depression or anxiety.  No memory loss.  OBJ- Physical Exam General- Alert,  Oriented, Affect-appropriate, Distress- none acute, +morbidly obese Skin- rash-none, lesions- none, excoriation- none Lymphadenopathy- none Head- atraumatic            Eyes- Gross vision intact, PERRLA, conjunctivae and secretions clear            Ears- Hearing, canals-normal            Nose- Clear, no-Septal dev, mucus, polyps, erosion, perforation             Throat- Mallampati II , mucosa clear , drainage- none, tonsils- atrophic Neck- flexible , trachea midline, no stridor , thyroid nl, carotid no bruit Chest - symmetrical excursion , unlabored           Heart/CV- IIR , no murmur , no gallop  , no rub, nl s1 s2                           - JVD- none , edema- none, stasis changes- none, varices- none           Lung- decreased airflow, unlabored, wheeze-none none , dullness-none, rub-                                             none           Chest wall-  Abd-  Br/ Gen/ Rectal- Not done, not indicated Extrem- cyanosis- none, clubbing, none, atrophy- none, strength- nl Neuro- grossly intact to observation

## 2014-05-22 NOTE — Patient Instructions (Addendum)
Order- new CPAP autotitrate 12-20 x 7 days for pressure recommendation, mask of choice, humidifier, supplies    Dx OSA                   He is using home O2 2L- ok to use the same DME  Order- schedule PFT   Dx Dyspnea on exertion

## 2014-05-25 DIAGNOSIS — J9611 Chronic respiratory failure with hypoxia: Secondary | ICD-10-CM | POA: Insufficient documentation

## 2014-05-25 DIAGNOSIS — G4733 Obstructive sleep apnea (adult) (pediatric): Secondary | ICD-10-CM | POA: Insufficient documentation

## 2014-05-25 HISTORY — DX: Obstructive sleep apnea (adult) (pediatric): G47.33

## 2014-05-25 NOTE — Assessment & Plan Note (Signed)
Being followed by Dr. Mohammed/oncology. Latest CT scan looks improved

## 2014-05-25 NOTE — Assessment & Plan Note (Addendum)
Combination of obesity hypoventilation syndrome and COPD His current oxygen can be bled through his new CPAP machine Plan-schedule PFT

## 2014-05-25 NOTE — Assessment & Plan Note (Signed)
If he becomes a candidate, bariatric surgery could make a meaningful improvement in his life

## 2014-05-25 NOTE — Assessment & Plan Note (Signed)
Appropriate educational discussions done. Cautioned about importance of weight loss and his responsibility to drive safely. Plan-he titrated to CPAP 16 but we will start with auto titration bracketing that pressure through assess best setting for his home environment.

## 2014-05-26 ENCOUNTER — Telehealth: Payer: Self-pay | Admitting: Internal Medicine

## 2014-05-26 NOTE — Telephone Encounter (Signed)
Spoke with [place] at [facility] states they got the patient's order on Friday 05-23-14 even though our office faxed the order on Thursday morning. She states to let the patient know that the order can take 3-9 business days to get approval from insurance and set up.   Pt is aware of the above and will contact our office if he has not heard from [place] in 3-9 business days.  Nothing more needed at this time.

## 2014-06-02 ENCOUNTER — Telehealth: Payer: Self-pay | Admitting: Internal Medicine

## 2014-06-02 DIAGNOSIS — Z23 Encounter for immunization: Secondary | ICD-10-CM | POA: Diagnosis not present

## 2014-06-02 DIAGNOSIS — G4733 Obstructive sleep apnea (adult) (pediatric): Secondary | ICD-10-CM | POA: Diagnosis not present

## 2014-06-02 DIAGNOSIS — J441 Chronic obstructive pulmonary disease with (acute) exacerbation: Secondary | ICD-10-CM | POA: Diagnosis not present

## 2014-06-02 DIAGNOSIS — G4719 Other hypersomnia: Secondary | ICD-10-CM | POA: Diagnosis not present

## 2014-06-02 NOTE — Telephone Encounter (Signed)
Spoke with Justin Ruiz- needing clarification of order sent. Pt to be placed on CPAP. Wears O2 24/7 and they need to wear it at might. Order does not state to bleed O2 into CPAP. Verbal given to bleed O2.   Chronic respiratory failure with hypoxia - Justin Lever, MD at 05/25/2014 4:56 PM     Status: Justin Ruiz Related Problem: Chronic respiratory failure with hypoxia   Expand All Collapse All   Combination of obesity hypoventilation syndrome and COPD His current oxygen can be bled through his new CPAP machine Plan-schedule PFT       Nothing further needed.

## 2014-06-05 ENCOUNTER — Other Ambulatory Visit: Payer: Self-pay | Admitting: Family Medicine

## 2014-06-11 DIAGNOSIS — J449 Chronic obstructive pulmonary disease, unspecified: Secondary | ICD-10-CM | POA: Diagnosis not present

## 2014-06-11 DIAGNOSIS — J439 Emphysema, unspecified: Secondary | ICD-10-CM | POA: Diagnosis not present

## 2014-06-13 ENCOUNTER — Telehealth: Payer: Self-pay | Admitting: Family Medicine

## 2014-06-13 ENCOUNTER — Other Ambulatory Visit: Payer: Self-pay | Admitting: Family Medicine

## 2014-06-13 MED ORDER — WARFARIN SODIUM 5 MG PO TABS: ORAL_TABLET | ORAL | Status: DC

## 2014-06-13 NOTE — Telephone Encounter (Signed)
done

## 2014-06-17 ENCOUNTER — Ambulatory Visit (INDEPENDENT_AMBULATORY_CARE_PROVIDER_SITE_OTHER): Payer: Commercial Managed Care - HMO | Admitting: Internal Medicine

## 2014-06-17 DIAGNOSIS — R0609 Other forms of dyspnea: Secondary | ICD-10-CM

## 2014-06-17 LAB — PULMONARY FUNCTION TEST
DL/VA % pred: 93 %
DL/VA: 4.34 ml/min/mmHg/L
DLCO UNC: 19.57 ml/min/mmHg
DLCO unc % pred: 59 %
FEF 25-75 POST: 0.52 L/s
FEF 25-75 Pre: 0.36 L/sec
FEF2575-%Change-Post: 43 %
FEF2575-%Pred-Post: 18 %
FEF2575-%Pred-Pre: 12 %
FEV1-%CHANGE-POST: 17 %
FEV1-%Pred-Post: 28 %
FEV1-%Pred-Pre: 24 %
FEV1-Post: 1.02 L
FEV1-Pre: 0.87 L
FEV1FVC-%Change-Post: 0 %
FEV1FVC-%Pred-Pre: 66 %
FEV6-%Change-Post: 15 %
FEV6-%PRED-POST: 43 %
FEV6-%PRED-PRE: 37 %
FEV6-PRE: 1.7 L
FEV6-Post: 1.97 L
FEV6FVC-%Change-Post: 0 %
FEV6FVC-%PRED-POST: 102 %
FEV6FVC-%Pred-Pre: 102 %
FVC-%Change-Post: 16 %
FVC-%Pred-Post: 42 %
FVC-%Pred-Pre: 36 %
FVC-PRE: 1.75 L
FVC-Post: 2.03 L
PRE FEV6/FVC RATIO: 98 %
Post FEV1/FVC ratio: 50 %
Post FEV6/FVC ratio: 97 %
Pre FEV1/FVC ratio: 50 %
RV % PRED: 158 %
RV: 3.7 L
TLC % pred: 82 %
TLC: 5.85 L

## 2014-06-17 NOTE — Progress Notes (Signed)
PFT done today. 

## 2014-06-25 ENCOUNTER — Ambulatory Visit (INDEPENDENT_AMBULATORY_CARE_PROVIDER_SITE_OTHER): Payer: Commercial Managed Care - HMO | Admitting: Pharmacist

## 2014-06-25 DIAGNOSIS — I4819 Other persistent atrial fibrillation: Secondary | ICD-10-CM

## 2014-06-25 DIAGNOSIS — I481 Persistent atrial fibrillation: Secondary | ICD-10-CM | POA: Diagnosis not present

## 2014-06-25 DIAGNOSIS — Z7901 Long term (current) use of anticoagulants: Secondary | ICD-10-CM

## 2014-06-25 DIAGNOSIS — I4891 Unspecified atrial fibrillation: Secondary | ICD-10-CM | POA: Diagnosis not present

## 2014-06-25 LAB — POCT INR: INR: 2.9

## 2014-06-25 MED ORDER — WARFARIN SODIUM 5 MG PO TABS: ORAL_TABLET | ORAL | Status: DC

## 2014-06-25 NOTE — Patient Instructions (Signed)
Anticoagulation Dose Instructions as of 06/25/2014      Justin Ruiz Tue Wed Thu Fri Sat   New Dose 5 mg 7.5 mg 5 mg 5 mg 5 mg 7.5 mg 5 mg    Description        Continue warfarin 7.5mg  mondays and fridays and 5mg  all other days.     INR was 2.9 today

## 2014-07-01 DIAGNOSIS — Z23 Encounter for immunization: Secondary | ICD-10-CM | POA: Diagnosis not present

## 2014-07-01 DIAGNOSIS — G4719 Other hypersomnia: Secondary | ICD-10-CM | POA: Diagnosis not present

## 2014-07-01 DIAGNOSIS — J441 Chronic obstructive pulmonary disease with (acute) exacerbation: Secondary | ICD-10-CM | POA: Diagnosis not present

## 2014-07-01 DIAGNOSIS — G4733 Obstructive sleep apnea (adult) (pediatric): Secondary | ICD-10-CM | POA: Diagnosis not present

## 2014-07-10 DIAGNOSIS — J449 Chronic obstructive pulmonary disease, unspecified: Secondary | ICD-10-CM | POA: Diagnosis not present

## 2014-07-10 DIAGNOSIS — J439 Emphysema, unspecified: Secondary | ICD-10-CM | POA: Diagnosis not present

## 2014-07-21 ENCOUNTER — Ambulatory Visit: Payer: Commercial Managed Care - HMO | Admitting: Internal Medicine

## 2014-07-21 ENCOUNTER — Encounter: Payer: Self-pay | Admitting: Internal Medicine

## 2014-07-21 VITALS — BP 110/70 | HR 100 | Ht 69.0 in | Wt 342.0 lb

## 2014-07-21 DIAGNOSIS — G4733 Obstructive sleep apnea (adult) (pediatric): Secondary | ICD-10-CM

## 2014-07-21 DIAGNOSIS — J439 Emphysema, unspecified: Secondary | ICD-10-CM

## 2014-07-21 MED ORDER — ALBUTEROL SULFATE 108 (90 BASE) MCG/ACT IN AEPB: 2.0000 | INHALATION_SPRAY | RESPIRATORY_TRACT | Status: DC | PRN

## 2014-07-21 NOTE — Progress Notes (Signed)
12/02/11- 62 yoM former smoker followed for COPD, OSA, complicated by hx lymphoma, AFib/ coumadin PFT 11/23/06- 0.47.   Hx 35 pk yr cigs.  LOV- 06/28/10  Patient states doing "pretty good". c/o sob with exertion. denies chest pain, chest tightness, wheezing, and cough. DOE especially on stairs- no change. Uses neb 1-2x/ yr. Seasonal pollens bother some.  OSA status not addressed this visit. COPD Assessment Test( CAT) 16/24  01/09/12-  61 yoM former smoker followed for COPD, OSA, complicated by hx lymphoma, AFib/ coumadin ACUTE VISIT: unable to lay flat-not able to breathe; Increased SOB and wheezing-getting worse; breathing tx at 4am and used rescue inhaler as well; has been going on since yesterday. Wife here On arrival today o2 sat 80, responded to 3 L O2. COPD assessment test (CAT) 40/40. Did well at [facility] on vacation without recognized fluid retention or other acute event. Home over the past week- Gradually worse shortness of breath x2 days with increased cough, watery rhinorrhea. Rattling chest congestion x2 days. Denies fever, pain, green, blood, sore throat. Wife gave antihistamine yesterday. CT chest 11/01/11-reviewed with them IMPRESSION:  1. New airspace opacity in the left lower lobe - pneumonia or  pulmonary hemorrhage could give a similar appearance. Follow-up  chest radiography to ensure clearance is recommended.  2. Several small right lung nodules are stable and nonspecific due  to small size.  3. Mediastinal lymph nodes are slightly increased in size compared  the prior exam, but currently within normal size limits.  4. Atherosclerosis.   01/22/13- 61 yoM former smoker followed for COPD, OSA, complicated by hx lymphoma, AFib/ coumadin pt reports breathing is doing well-- denies any other concerns at this time Schedule for vaccine next week. Denies cough wheeze or active concern. Seasonal rhinitis with watery nose. Rescue inhaler sometimes  once a day, currently 2 or 3 times per  week. Denies any concerns about potential cough relation enalapril-discussed CXR 09/03/12 IMPRESSION:  No acute or metastatic cardiopulmonary abnormality.  rec Refills for rescue inhaler and nebulizer  06/26/2013 pulmonary consultation /[facility] re: ? Recurrent lymphoma vs ca Chief Complaint  Patient presents with  . Follow-up    Pt here at the request of Dr. Beryle Beams for eval of PET and possible Bronch. Pt reports having increased DOE for the past month.  doe indolent onset x steps, flat ok - no cough on ACEi  Has duoneb/ saba hfa but hasn't tried them before desired activity   12/09/13-  61 yoM former smoker followed for COPD, OSA, complicated by hx lymphoma, DM , AFib/ coumadin, morbid obesity FOLLOWS FOR: Increased SOB over the past month; wheezing at times, sats dropping in 80's(has noticed this more at night time) He no longer has home oxygen. O2 sat here dropped to 86% at rest standing on room air today on arrival. He had been needing to use nebulizer more regularly for chest tightness before leaving for beach, and has not improved. Denies acute infection, chest pain, andkle edema or bleeding. Using home nebulizer with DuoNeb. Lymphoma being followed by oncology with CT scan. Nodules are reported smaller. CT chest 09/06/13 IMPRESSION:  1. Mediastinal and right hilar lymphadenopathy identified on the  recent PET-CT has decreased in size in the interval since  06/19/2013.  2. Scattered bilateral small pulmonary nodules. 1 of the nodules in  the right middle lobe measures minimally larger on today's study but  the remaining nodules are all stable. No new pulmonary nodule is  evident.  3. Stable small hepatoduodenal  ligament lymph nodes. No change in  the retroperitoneal aortocaval lymph node measured previously  comparing back to the study from 03/04/2013. No new or progressive  nodal disease in the abdomen or pelvis.  Electronically Signed  By: [name] Stanley M.D.  On: 09/06/2013  09:35  *Not clear why he has never had sleep study- first priority is to address O2 needs, but will need to address OSA next visit* Also needs update PFT  01/24/2014 Follow up  Returns for 6 week follow up for COPD flare  Tx w/ Depo Medrol shot last ov and started on BREO . Does not want to continue BREO says he does not feel he needs it.  Has duoneb at home but does not use it.  No recent PFt  Started on O2 last ov for persistent desats.  Pt has CT chest next month for follow up for known lung nodules.  Says he is feeling much better , stopped his Oxygen . We  Discussed need to wear at all times. O2 sats 83% on RA on arrival to office , at rest 90% on RA . Of note on ACEI, denies cough.  Has daytime sleepiness, snoring , never had a sleep study in past.   05/22/14-63 yoM former smoker followed for COPD/ chronic hypoxic resp failure, OSA, complicated by hx lymphoma, DM , AFib/ coumadin, morbid obesity FOLLOWS FOR: Review sleep study with patient. NPSG-  04/07/2014-severe obstructive sleep apnea-AHI 75.1 per hour. CPAP titrated to 16. He required supplemental oxygen at 2 L/ [employer] which he already wears for sleep at home. Weight 338 pounds Dr. Earlie Server continues following for oncology/lymphoma CT chest 03/17/14-images reviewed IMPRESSION:  CT CHEST IMPRESSION  1. Similar to slight improvement in subpleural pulmonary nodules,  favored to represent benign subpleural lymph nodes. The right middle  lobe nodule described on the prior exam has resolved.  2. No acute process or evidence of active lymphoma within the chest.  3. Atherosclerosis, including within the coronary arteries.  CT ABDOMEN AND PELVIS IMPRESSION  1. Similar retrocaval adenopathy.  2. No evidence of new or progressive adenopathy within the abdomen  or pelvis.  3. Cholelithiasis.  Electronically Signed  By: Abigail Miyamoto M.D.  On: 03/17/2014 11:14  07/21/14- 30 yoM former smoker followed for COPD/ chronic hypoxic resp  failure, OSA, complicated by hx lymphoma, DM , AFib/ coumadin, morbid obesity FOLLOWS FOR states wearing cpap 5-7 hours a night. Denies any problems with pressure or equipment  CPAP auto 12-20/ [employer]    ROS-see HPI Constitutional:   No-   weight loss, night sweats, fevers, chills, +fatigue, lassitude. HEENT:   No-  headaches, difficulty swallowing, tooth/dental problems, sore throat,       No-  sneezing, itching, ear ache, nasal congestion, post nasal drip,  CV:  No-   chest pain, orthopnea, PND, swelling in lower extremities, anasarca,                                  dizziness, palpitations Resp: + shortness of breath with exertion or at rest.              No-   productive cough,  No non-productive cough,  No- coughing up of blood.              No-   change in color of mucus.   Skin: No-   rash or lesions. GI:  No-   heartburn, indigestion,  abdominal pain, nausea, vomiting,  GU:  MS:  No-   joint pain or swelling.  . Neuro-     nothing unusual Psych:  No- change in mood or affect. No depression or anxiety.  No memory loss.  OBJ- Physical Exam General- Alert, Oriented, Affect-appropriate, Distress- none acute, +morbidly obese Skin- rash-none, lesions- none, excoriation- none Lymphadenopathy- none Head- atraumatic            Eyes- Gross vision intact, PERRLA, conjunctivae and secretions clear            Ears- Hearing, canals-normal            Nose- Clear, no-Septal dev, mucus, polyps, erosion, perforation             Throat- Mallampati II , mucosa clear , drainage- none, tonsils- atrophic Neck- flexible , trachea midline, no stridor , thyroid nl, carotid no bruit Chest - symmetrical excursion , unlabored           Heart/CV- IIR , no murmur , no gallop  , no rub, nl s1 s2                           - JVD- none , edema- none, stasis changes- none, varices- none           Lung- decreased airflow, unlabored, wheeze-none none , dullness-none, rub-                                              none           Chest wall-  Abd-  Br/ Gen/ Rectal- Not done, not indicated Extrem- cyanosis- none, clubbing, none, atrophy- none, strength- nl Neuro- grossly intact to observation

## 2014-07-21 NOTE — Patient Instructions (Signed)
Order- CPAP download [employer] for pressure compliance    Dx OSA  Coupon and script for Proair Respiclick  So you can get one free. If your insurance doesn't cover this, then you can switch back to the traditional Proair.  2 puffs every 4-6 hours if needed- rescue inhaler

## 2014-07-28 ENCOUNTER — Other Ambulatory Visit: Payer: Self-pay | Admitting: Pharmacist

## 2014-07-31 DIAGNOSIS — H1045 Other chronic allergic conjunctivitis: Secondary | ICD-10-CM | POA: Diagnosis not present

## 2014-07-31 DIAGNOSIS — E784 Other hyperlipidemia: Secondary | ICD-10-CM | POA: Diagnosis not present

## 2014-07-31 DIAGNOSIS — H524 Presbyopia: Secondary | ICD-10-CM | POA: Diagnosis not present

## 2014-07-31 DIAGNOSIS — I10 Essential (primary) hypertension: Secondary | ICD-10-CM | POA: Diagnosis not present

## 2014-07-31 DIAGNOSIS — H5210 Myopia, unspecified eye: Secondary | ICD-10-CM | POA: Diagnosis not present

## 2014-07-31 DIAGNOSIS — E109 Type 1 diabetes mellitus without complications: Secondary | ICD-10-CM | POA: Diagnosis not present

## 2014-07-31 LAB — HM DIABETES EYE EXAM

## 2014-08-01 DIAGNOSIS — G4733 Obstructive sleep apnea (adult) (pediatric): Secondary | ICD-10-CM | POA: Diagnosis not present

## 2014-08-01 DIAGNOSIS — J441 Chronic obstructive pulmonary disease with (acute) exacerbation: Secondary | ICD-10-CM | POA: Diagnosis not present

## 2014-08-01 DIAGNOSIS — Z23 Encounter for immunization: Secondary | ICD-10-CM | POA: Diagnosis not present

## 2014-08-01 DIAGNOSIS — G4719 Other hypersomnia: Secondary | ICD-10-CM | POA: Diagnosis not present

## 2014-08-05 ENCOUNTER — Other Ambulatory Visit: Payer: Self-pay | Admitting: Family Medicine

## 2014-08-10 DIAGNOSIS — J439 Emphysema, unspecified: Secondary | ICD-10-CM | POA: Diagnosis not present

## 2014-08-10 DIAGNOSIS — J449 Chronic obstructive pulmonary disease, unspecified: Secondary | ICD-10-CM | POA: Diagnosis not present

## 2014-08-11 ENCOUNTER — Encounter: Payer: Self-pay | Admitting: Family Medicine

## 2014-08-11 ENCOUNTER — Telehealth: Payer: Self-pay

## 2014-08-11 ENCOUNTER — Ambulatory Visit (INDEPENDENT_AMBULATORY_CARE_PROVIDER_SITE_OTHER): Payer: Commercial Managed Care - HMO

## 2014-08-11 ENCOUNTER — Ambulatory Visit (INDEPENDENT_AMBULATORY_CARE_PROVIDER_SITE_OTHER): Payer: Commercial Managed Care - HMO | Admitting: Family Medicine

## 2014-08-11 VITALS — BP 136/80 | HR 81 | Temp 97.3°F | Ht 69.0 in | Wt 338.0 lb

## 2014-08-11 DIAGNOSIS — K219 Gastro-esophageal reflux disease without esophagitis: Secondary | ICD-10-CM

## 2014-08-11 DIAGNOSIS — I4819 Other persistent atrial fibrillation: Secondary | ICD-10-CM

## 2014-08-11 DIAGNOSIS — I481 Persistent atrial fibrillation: Secondary | ICD-10-CM

## 2014-08-11 DIAGNOSIS — Z7901 Long term (current) use of anticoagulants: Secondary | ICD-10-CM

## 2014-08-11 DIAGNOSIS — G629 Polyneuropathy, unspecified: Secondary | ICD-10-CM

## 2014-08-11 DIAGNOSIS — E559 Vitamin D deficiency, unspecified: Secondary | ICD-10-CM

## 2014-08-11 DIAGNOSIS — E785 Hyperlipidemia, unspecified: Secondary | ICD-10-CM

## 2014-08-11 DIAGNOSIS — E118 Type 2 diabetes mellitus with unspecified complications: Secondary | ICD-10-CM

## 2014-08-11 DIAGNOSIS — E0843 Diabetes mellitus due to underlying condition with diabetic autonomic (poly)neuropathy: Secondary | ICD-10-CM | POA: Diagnosis not present

## 2014-08-11 DIAGNOSIS — I10 Essential (primary) hypertension: Secondary | ICD-10-CM | POA: Diagnosis not present

## 2014-08-11 DIAGNOSIS — I482 Chronic atrial fibrillation, unspecified: Secondary | ICD-10-CM

## 2014-08-11 DIAGNOSIS — N400 Enlarged prostate without lower urinary tract symptoms: Secondary | ICD-10-CM

## 2014-08-11 LAB — POCT CBC
Granulocyte percent: 65.8 %G (ref 37–80)
HEMATOCRIT: 50 % (ref 43.5–53.7)
Hemoglobin: 15.1 g/dL (ref 14.1–18.1)
Lymph, poc: 2 (ref 0.6–3.4)
MCH, POC: 28 pg (ref 27–31.2)
MCHC: 30.2 g/dL — AB (ref 31.8–35.4)
MCV: 92.7 fL (ref 80–97)
MPV: 8.1 fL (ref 0–99.8)
POC Granulocyte: 4.5 (ref 2–6.9)
POC LYMPH PERCENT: 28.5 %L (ref 10–50)
Platelet Count, POC: 171 10*3/uL (ref 142–424)
RBC: 5.39 M/uL (ref 4.69–6.13)
RDW, POC: 14 %
WBC: 6.9 10*3/uL (ref 4.6–10.2)

## 2014-08-11 LAB — POCT INR: INR: 3.1

## 2014-08-11 LAB — POCT GLYCOSYLATED HEMOGLOBIN (HGB A1C): HEMOGLOBIN A1C: 7.8

## 2014-08-11 NOTE — Patient Instructions (Addendum)
Medicare Annual Wellness Visit  [facility] and the medical providers at [facility] strive to bring you the best medical care.  In doing so we not only want to address your current medical conditions and concerns but also to detect new conditions early and prevent illness, disease and health-related problems.    Medicare offers a yearly Wellness Visit which allows our clinical staff to assess your need for preventative services including immunizations, lifestyle education, counseling to decrease risk of preventable diseases and screening for fall risk and other medical concerns.    This visit is provided free of charge (no copay) for all Medicare recipients. The clinical pharmacists at [facility] have begun to conduct these Wellness Visits which will also include a thorough review of all your medications.    As you primary medical provider recommend that you make an appointment for your Annual Wellness Visit if you have not done so already this year.  You may set up this appointment before you leave today or you may call back ([phone]) and schedule an appointment.  Please make sure when you call that you mention that you are scheduling your Annual Wellness Visit with the clinical pharmacist so that the appointment may be made for the proper length of time.     Continue current medications. Continue good therapeutic lifestyle changes which include good diet and exercise. Fall precautions discussed with patient. If an FOBT was given today- please return it to our front desk. If you are over 16 years old - you may need Prevnar 50 or the adult Pneumonia vaccine.  Flu Shots are still available at our office. If you still haven't had one please call to set up a nurse visit to get one.   After your visit with [place] today you will receive a survey in the mail or online from [employer] regarding your care with [place]. Please take a moment to  fill this out. Your feedback is very important to [place] as you can help [place] better understand your patient needs as well as improve your experience and satisfaction. WE CARE ABOUT YOU!!!   We will call you with the results of the lab work once these results become available. We will also call you with the results of the LS spine films in order to try to find out why your legs are feeling numb and you're having increased edema. Continue regular follow-ups with oncology and cardiology and pulmonology. Continue all efforts at losing weight and watching her diet and getting as much exercise as possible Continue watching sodium intake And continue to monitor blood sugars regularly and bring these readings to the next visit  Anticoagulation Dose Instructions as of 08/11/2014      Dorene Grebe Tue Wed Thu Fri Sat   New Dose 5 mg 7.5 mg 5 mg 5 mg 5 mg 7.5 mg 5 mg    Description        Take only 1/2 tablet on warfarin today, then continue warfarin 7.5mg  mondays and fridays and 5mg  all other days.      INR was 3.1 today

## 2014-08-11 NOTE — Progress Notes (Signed)
Subjective:    Patient ID: Justin Ruiz, male    DOB: [birth year], 64 y.o.   MRN: [id number]  HPI Pt here for follow up and management of chronic medical problems which includes hypertension, hyperlipidemia, and diabetes. He is taking medications regularly. The patient today complains of increased swelling in both feet and weakness in both of his legs. The patient has recently seen the pulmonologist because of his asthma/COPD symptoms and sleep apnea. He is having more trouble breathing recently just because of the pollen and has been using his nebulizer regularly. He is doing much better with his breathing now than he was a week or so ago. Patient sees the cardiologist regularly and that visit is coming up soon. He also sees the oncologist because of his non-Hodgkin's lymphoma.       Patient Active Problem List   Diagnosis Date Noted  . Chronic respiratory failure with hypoxia 05/25/2014  . Obstructive sleep apnea 05/25/2014  . Benign neoplasm of ascending colon 05/05/2014  . Excessive daytime sleepiness 01/24/2014  . Mediastinal adenopathy 06/20/2013  . Severe obesity (BMI >= 40) 05/01/2013  . Generalized anxiety disorder 05/01/2013  . Lung nodules 03/11/2013  . Chronic anticoagulation 08/20/2012  . FIBRILLATION, ATRIAL 05/12/2010  . NHL (non-Hodgkin's lymphoma) 01/26/2010  . Obesity, morbid 04/27/2009  . CARDIOVASCULAR STUDIES, ABNORMAL 04/27/2009  . ABNORMAL STRESS ELECTROCARDIOGRAM 03/25/2009  . PERSONAL HISTORY OF COLONIC POLYPS 03/02/2009  . DIABETES, TYPE 2 04/29/2007  . HYPERLIPIDEMIA 04/29/2007  . Hypertension 04/29/2007  . Seasonal and perennial allergic rhinitis 04/29/2007  . COPD exacerbation 04/29/2007  . G E R D 04/29/2007  . BENIGN PROSTATIC HYPERTROPHY, HX OF 04/29/2007   Outpatient Encounter Prescriptions as of 08/11/2014  Medication Sig  . acetaminophen (TYLENOL) 500 MG tablet Take 1,000 mg by mouth every 6 (six) hours as needed for headache.  . albuterol  (PROAIR HFA) 108 (90 BASE) MCG/ACT inhaler Inhale 2 puffs into the lungs every 6 (six) hours as needed for wheezing or shortness of breath.  . Albuterol Sulfate (PROAIR RESPICLICK) 409 (90 BASE) MCG/ACT AEPB Inhale 2 puffs into the lungs every 4 (four) hours as needed.  . ALPRAZolam (XANAX) 0.25 MG tablet Take 1 tablet (0.25 mg total) by mouth at bedtime as needed. (Patient taking differently: Take 0.25 mg by mouth at bedtime as needed for sleep. )  . Blood Glucose Calibration (TAI DOC CONTROL) NORMAL SOLN Use as directed.  . Blood Glucose Monitoring Suppl (CLEVER CHEK AUTO-CODE VOICE) DEVI USE AS DIRECTED  . budesonide-formoterol (SYMBICORT) 160-4.5 MCG/ACT inhaler Inhale 2 puffs into the lungs 2 (two) times daily.  . Cholecalciferol (VITAMIN D3) 5000 UNITS TABS Take 1 tablet by mouth every morning.  . enalapril (VASOTEC) 20 MG tablet TAKE ONE TABLET BY MOUTH ONCE DAILY  . ipratropium-albuterol (DUONEB) 0.5-2.5 (3) MG/3ML SOLN PLACE 1 VIAL (3 MLS) IN THE NEBULIZER EVERY 6 HOURS AS NEEDED  . Lancet Devices (LANCING DEVICE) MISC USE AS DIRECTED  . metFORMIN (GLUCOPHAGE) 500 MG tablet TAKE ONE TABLET BY MOUTH IN THE MORNING AND TWO IN THE EVENING  . metoprolol (LOPRESSOR) 100 MG tablet Take 1 tablet (100 mg total) by mouth 2 (two) times daily.  Marland Kitchen NOVOLIN N RELION 100 UNIT/ML injection INJECT 0.32MLS (32 UNITS) INTO THE SKIN TWICE DAILY BEFORE A MEAL  . NOVOLIN R RELION 100 UNIT/ML injection INJECT 0.32MLS (32 UNITS) INTO THE SKIN TWO TIMES DAILY BEFORE A MEAL  . simvastatin (ZOCOR) 40 MG tablet Take 1 tablet (40 mg total)  by mouth daily. (Patient taking differently: Take 40 mg by mouth every evening. )  . warfarin (COUMADIN) 5 MG tablet TAKE ONE & ONE-HALF TABS BY PO QD ON  MONDAY AND  FRIDAY & THEN TAKE 1 TAB THE REST OF THE WEEK     Review of Systems  Constitutional: Negative.   HENT: Negative.   Eyes: Negative.   Respiratory: Negative.   Cardiovascular: Negative.   Gastrointestinal:  Negative.   Endocrine: Negative.   Genitourinary: Negative.   Musculoskeletal: Positive for myalgias (bilateral leg weakness).  Skin: Negative.   Allergic/Immunologic: Negative.   Neurological: Positive for numbness (bilateral feet neuropathy and swelling).  Hematological: Negative.   Psychiatric/Behavioral: Negative.        Objective:   Physical Exam  Constitutional: He is oriented to person, place, and time. He appears well-developed and well-nourished. No distress.  HENT:  Head: Normocephalic and atraumatic.  Right Ear: External ear normal.  Left Ear: External ear normal.  Nose: Nose normal.  Mouth/Throat: Oropharynx is clear and moist. No oropharyngeal exudate.  Eyes: Conjunctivae and EOM are normal. Pupils are equal, round, and reactive to light. Right eye exhibits no discharge. Left eye exhibits no discharge. No scleral icterus.  Neck: Normal range of motion. Neck supple. No thyromegaly present.  There are no carotid bruits thyromegaly or anterior cervical adenopathy  Cardiovascular: Normal rate, regular rhythm, normal heart sounds and intact distal pulses.  Exam reveals no gallop and no friction rub.   No murmur heard. The heart is somewhat irregular at 84/m  Pulmonary/Chest: Effort normal and breath sounds normal. No respiratory distress. He has no wheezes. He has no rales. He exhibits no tenderness.  Breath sounds are distant and there are scattered wheezes  Abdominal: Soft. Bowel sounds are normal. He exhibits no mass. There is no tenderness. There is no rebound and no guarding.  The patient has morbid obesity. There are no masses, organ enlargement or bruits.  Genitourinary: Rectum normal.  Musculoskeletal: Normal range of motion. He exhibits no edema or tenderness.  There is no edema on his lower extremities or feet.  Lymphadenopathy:    He has no cervical adenopathy.  Neurological: He is alert and oriented to person, place, and time. He has normal reflexes. No cranial  nerve deficit.  Skin: Skin is warm and dry. No rash noted. No erythema. No pallor.  Psychiatric: He has a normal mood and affect. His behavior is normal. Judgment and thought content normal.  Nursing note and vitals reviewed.  BP 136/80 mmHg  Pulse 81  Temp(Src) 97.3 F (36.3 C) (Oral)  Ht '5\' 9"'  (1.753 m)  Wt 338 lb (153.316 kg)  BMI 49.89 kg/m2   [name] reading (PRIMARY) by  Dr. 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 - POCT CBC - POCT glycosylated hemoglobin (Hb A1C)  2. Vitamin D deficiency -He will continue his current vitamin D pending results of lab work today. - POCT CBC - Vit D  25 hydroxy (rtn  osteoporosis monitoring)  3. Hypertension -The blood pressure is under good control and he will continue current treatment - POCT CBC - BMP8+EGFR - Hepatic function panel  4. Type 2 diabetes mellitus with complication -The patient is taking his medicines regularly and we will wait and see what his A1c is to determine if we need more aggressive management - POCT CBC - POCT glycosylated hemoglobin (Hb A1C)  5. Hyperlipemia -Past cholesterol numbers have been good and depending upon lab work today you will continue current treatment - POCT CBC - NMR, lipoprofile  6. BPH (benign prostatic hypertrophy) -He is having no symptoms with his voiding  - POCT CBC  7. Gastroesophageal reflux disease, esophagitis presence not specified -He has no complaints today with reflux symptoms - POCT CBC  8. FIBRILLATION, ATRIAL -He has an upcoming visit with the cardiologist to follow up with his atrial fib -  POCT INR  9. Peripheral neuropathy -This is a new problem and we will start by evaluating with the LS spine to see if there could be some nerve impingement pain role with his numbness in his legs.  Patient Instructions                       Medicare Annual Wellness Visit  [facility] and the medical providers at [facility] strive to bring you the best medical care.  In doing so we not only want to address your current medical conditions and concerns but also to detect new conditions early and prevent illness, disease and health-related problems.    Medicare offers a yearly Wellness Visit which allows our clinical staff to assess your need for preventative services including immunizations, lifestyle education, counseling to decrease risk of preventable diseases and screening for fall risk and other medical concerns.    This visit is provided free of charge (no copay) for all Medicare recipients. The clinical pharmacists at [facility] have begun to conduct these Wellness Visits which will also include a thorough review of all your medications.    As you primary medical provider recommend that you make an appointment for your Annual Wellness Visit if you have not done so already this year.  You may set up this appointment before you leave today or you may call back ([phone]) and schedule an appointment.  Please make sure when you call that you mention that you are scheduling your Annual Wellness Visit with the clinical pharmacist so that the appointment may be made for the proper length of time.     Continue current medications. Continue good therapeutic lifestyle changes which include good diet and exercise. Fall precautions discussed with patient. If an FOBT was given today- please return it to our front desk. If you are over 12 years old - you may need Prevnar 28 or the adult Pneumonia vaccine.  Flu Shots are still available at our office. If  you still haven't had one please call to set up a nurse visit to get one.   After your visit with [place] today you will receive a survey in the mail or online from [employer] regarding your care with [place]. Please take a moment to fill this out. Your feedback is very important to [place] as you can help [place] better understand your patient needs as well as improve your experience and satisfaction. WE CARE ABOUT YOU!!!   We will call you with the results of the lab work once these results become available.  We will also call you with the results of the LS spine films in order to try to find out why your legs are feeling numb and you're having increased edema. Continue regular follow-ups with oncology and cardiology and pulmonology. Continue all efforts at losing weight and watching her diet and getting as much exercise as possible Continue watching sodium intake And continue to monitor blood sugars regularly and bring these readings to the next visit   Arrie Senate MD

## 2014-08-12 ENCOUNTER — Other Ambulatory Visit: Payer: Self-pay | Admitting: Family Medicine

## 2014-08-12 DIAGNOSIS — M5136 Other intervertebral disc degeneration, lumbar region: Secondary | ICD-10-CM

## 2014-08-12 LAB — NMR, LIPOPROFILE
Cholesterol: 168 mg/dL (ref 100–199)
HDL CHOLESTEROL BY NMR: 45 mg/dL (ref >39)
HDL Particle Number: 31.8 umol/L (ref >=30.5)
LDL PARTICLE NUMBER: 962 nmol/L (ref <1000)
LDL Size: 19.9 nm (ref >20.5)
LDL-C: 81 mg/dL (ref 0–99)
LP-IR Score: 70 — ABNORMAL HIGH (ref <=45)
Small LDL Particle Number: 691 nmol/L — ABNORMAL HIGH (ref <=527)
TRIGLYCERIDES BY NMR: 212 mg/dL — AB (ref 0–149)

## 2014-08-12 LAB — BMP8+EGFR
BUN/Creatinine Ratio: 18 (ref 10–22)
BUN: 16 mg/dL (ref 8–27)
CALCIUM: 9.8 mg/dL (ref 8.6–10.2)
CO2: 27 mmol/L (ref 18–29)
Chloride: 98 mmol/L (ref 97–108)
Creatinine, Ser: 0.88 mg/dL (ref 0.76–1.27)
GFR calc Af Amer: 106 mL/min/{1.73_m2} (ref >59)
GFR, EST NON AFRICAN AMERICAN: 91 mL/min/{1.73_m2} (ref >59)
Glucose: 167 mg/dL — ABNORMAL HIGH (ref 65–99)
POTASSIUM: 5 mmol/L (ref 3.5–5.2)
SODIUM: 141 mmol/L (ref 134–144)

## 2014-08-12 LAB — HEPATIC FUNCTION PANEL
ALK PHOS: 63 IU/L (ref 39–117)
ALT: 17 IU/L (ref 0–44)
AST: 18 IU/L (ref 0–40)
Albumin: 4 g/dL (ref 3.6–4.8)
BILIRUBIN TOTAL: 1.2 mg/dL (ref 0.0–1.2)
Bilirubin, Direct: 0.25 mg/dL (ref 0.00–0.40)
Total Protein: 6.7 g/dL (ref 6.0–8.5)

## 2014-08-12 LAB — VITAMIN D 25 HYDROXY (VIT D DEFICIENCY, FRACTURES): Vit D, 25-Hydroxy: 37.9 ng/mL (ref 30.0–100.0)

## 2014-08-13 ENCOUNTER — Telehealth: Payer: Self-pay | Admitting: Family Medicine

## 2014-08-13 ENCOUNTER — Other Ambulatory Visit: Payer: Self-pay | Admitting: Family Medicine

## 2014-08-13 NOTE — Telephone Encounter (Signed)
Karla r/c at 3pm

## 2014-08-14 NOTE — Telephone Encounter (Signed)
Last seen 08/11/14  DWM   If approved route to nurse to call into [facility]

## 2014-08-15 NOTE — Telephone Encounter (Signed)
Refill left on vm at [facility]

## 2014-08-20 ENCOUNTER — Encounter: Payer: Self-pay | Admitting: Cardiology

## 2014-08-20 ENCOUNTER — Ambulatory Visit (INDEPENDENT_AMBULATORY_CARE_PROVIDER_SITE_OTHER): Payer: Commercial Managed Care - HMO | Admitting: Cardiology

## 2014-08-20 VITALS — BP 108/76 | HR 72 | Ht 69.0 in | Wt 344.0 lb

## 2014-08-20 DIAGNOSIS — I482 Chronic atrial fibrillation, unspecified: Secondary | ICD-10-CM

## 2014-08-20 DIAGNOSIS — I10 Essential (primary) hypertension: Secondary | ICD-10-CM | POA: Diagnosis not present

## 2014-08-20 NOTE — Patient Instructions (Signed)
Medication Instructions:  Your physician recommends that you continue on your current medications as directed. Please refer to the Current Medication list given to you today.  Labwork: None  Testing/Procedures: None  Follow-Up: Follow up in 6 months with Dr. Percival Spanish in [place].  You will receive a letter in the mail 2 months before you are due.  Please call us when you receive this letter to schedule your follow up appointment.  Thank you for choosing [facility]!!

## 2014-08-20 NOTE — Progress Notes (Signed)
HPI Patient presents for routine followup. Since I last saw him he has had no new cardiovascular complaints. The patient denies any new symptoms such as chest discomfort, neck or arm discomfort.  He does not notice his fibrillation. . There has been no new shortness of breath, PND or orthopnea. There have been no reported palpitations, presyncope or syncope.    He is very inactive as I explore this with him.   Allergies  Allergen Reactions  . Avapro [Irbesartan] Other (See Comments)    "doesnt sit right with me"--light headed  . Lipitor [Atorvastatin Calcium] Other (See Comments)    Leg pain    Current Outpatient Prescriptions  Medication Sig Dispense Refill  . acetaminophen (TYLENOL) 500 MG tablet Take 1,000 mg by mouth every 6 (six) hours as needed for headache.    . albuterol (PROAIR HFA) 108 (90 BASE) MCG/ACT inhaler Inhale 2 puffs into the lungs every 6 (six) hours as needed for wheezing or shortness of breath. 8.5 g 5  . Albuterol Sulfate (PROAIR RESPICLICK) 734 (90 BASE) MCG/ACT AEPB Inhale 2 puffs into the lungs every 4 (four) hours as needed. 1 each 11  . ALPRAZolam (XANAX) 0.25 MG tablet TAKE ONE TABLET BY MOUTH AT BEDTIME AS NEEDED 30 tablet 1  . Blood Glucose Calibration (TAI DOC CONTROL) NORMAL SOLN Use as directed.    . Blood Glucose Monitoring Suppl (CLEVER CHEK AUTO-CODE VOICE) DEVI USE AS DIRECTED 1 each 2  . budesonide-formoterol (SYMBICORT) 160-4.5 MCG/ACT inhaler Inhale 2 puffs into the lungs 2 (two) times daily. 1 Inhaler 3  . Cholecalciferol (VITAMIN D3) 5000 UNITS TABS Take 1 tablet by mouth every morning.    . enalapril (VASOTEC) 20 MG tablet TAKE ONE TABLET BY MOUTH ONCE DAILY 30 tablet 3  . ipratropium-albuterol (DUONEB) 0.5-2.5 (3) MG/3ML SOLN PLACE 1 VIAL (3 MLS) IN THE NEBULIZER EVERY 6 HOURS AS NEEDED 90 mL 0  . Lancet Devices (LANCING DEVICE) MISC USE AS DIRECTED 1 each 2  . metFORMIN (GLUCOPHAGE) 500 MG tablet TAKE ONE TABLET BY MOUTH IN THE MORNING AND TWO  IN THE EVENING 270 tablet 3  . metoprolol (LOPRESSOR) 100 MG tablet Take 1 tablet (100 mg total) by mouth 2 (two) times daily. 180 tablet 3  . NOVOLIN N RELION 100 UNIT/ML injection INJECT 0.32MLS (32 UNITS) INTO THE SKIN TWICE DAILY BEFORE A MEAL 40 vial 0  . simvastatin (ZOCOR) 40 MG tablet Take 1 tablet (40 mg total) by mouth daily. (Patient taking differently: Take 40 mg by mouth every evening. ) 90 tablet 3  . warfarin (COUMADIN) 5 MG tablet TAKE ONE & ONE-HALF TABS BY PO QD ON  MONDAY AND  FRIDAY & THEN TAKE 1 TAB THE REST OF THE WEEK 140 tablet 1  . [DISCONTINUED] digoxin (LANOXIN) 0.25 MG tablet Take 250 mcg by mouth daily.      No current facility-administered medications for this visit.    Past Medical History  Diagnosis Date  . Diabetes mellitus   . Asthma   . Hyperlipidemia   . Obesity     exogenous  . BPH (benign prostatic hypertrophy)   . Colon polyps   . Shingles   . GERD (gastroesophageal reflux disease)   . Arthritis   . Asthma   . HTN (hypertension)     x 3 years  . Lung nodules 03/11/2013  . Mediastinal adenopathy 06/20/2013    CT  & PET 2/15   . Cancer   . NHL (non-Hodgkin's lymphoma)  nhl dx 9/11  . History of oxygen administration     occ. oxygen use at 2 l/m as needed- not using at this time  . Sleep apnea     recent study 04-07-14 awaiting results, no cpap yet.-Dr. Glynn Octave    Past Surgical History  Procedure Laterality Date  . Appendectomy    . Colonoscopy with propofol N/A 05/05/2014    Procedure: COLONOSCOPY WITH PROPOFOL;  Surgeon: Inda Castle, MD;  Location: [facility];  Service: Endoscopy;  Laterality: N/A;    ROS:  As stated in the HPI and negative for all other systems.  PHYSICAL EXAM BP 108/76 mmHg  Pulse 72  Ht '5\' 9"'  (1.753 m)  Wt 344 lb (156.037 kg)  BMI 50.78 kg/m2 GENERAL:  Well appearing HEENT:  Pupils equal round and reactive, fundi not visualized, oral mucosa unremarkable NECK:  No jugular venous distention, waveform  within normal limits, carotid upstroke brisk and symmetric, no bruits, no thyromegaly LUNGS:  Clear to auscultation bilaterally BACK:  No CVA tenderness CHEST:  Unremarkable HEART:  PMI not displaced or sustained,S1 and S2 within normal limits, no S3, no clicks, no rubs, no murmurs, irregular ABD:  Flat, positive bowel sounds normal in frequency in pitch, no bruits, no rebound, no guarding, no midline pulsatile mass, no hepatomegaly, no splenomegaly, obese EXT:  2 plus pulses throughout, no edema, no cyanosis no clubbing   EKG:  Atrial fibrillation rate 72, axis within normal limits, intervals within normal limits, diffuse T wave flattening.  08/20/2014   ASSESSMENT AND PLAN  FIBRILLATION, ATRIAL -  He has no symptomatic paroxysms. He tolerates anticoagulation. The patient is not interested in switching to a novel oral anticoagulant.   [name] has a CHA2DS2 - VASc score of 2 with a risk of stroke of 2.2%.  Essential hypertension, benign -  The blood pressure is at target. No change in medications is indicated. We will continue with therapeutic lifestyle changes (TLC).  OBESITY, UNSPECIFIED - The patient understands the need to lose weight with diet and exercise. We have discussed specific strategies for this.  DM - His A1c was 7.8.  I discussed this with him and he understands the role of weight loss in treating this

## 2014-08-31 DIAGNOSIS — J441 Chronic obstructive pulmonary disease with (acute) exacerbation: Secondary | ICD-10-CM | POA: Diagnosis not present

## 2014-08-31 DIAGNOSIS — G4719 Other hypersomnia: Secondary | ICD-10-CM | POA: Diagnosis not present

## 2014-08-31 DIAGNOSIS — G4733 Obstructive sleep apnea (adult) (pediatric): Secondary | ICD-10-CM | POA: Diagnosis not present

## 2014-08-31 DIAGNOSIS — Z23 Encounter for immunization: Secondary | ICD-10-CM | POA: Diagnosis not present

## 2014-09-01 DIAGNOSIS — G4733 Obstructive sleep apnea (adult) (pediatric): Secondary | ICD-10-CM | POA: Diagnosis not present

## 2014-09-05 ENCOUNTER — Ambulatory Visit (HOSPITAL_COMMUNITY): Payer: Commercial Managed Care - HMO

## 2014-09-09 DIAGNOSIS — J449 Chronic obstructive pulmonary disease, unspecified: Secondary | ICD-10-CM | POA: Diagnosis not present

## 2014-09-09 DIAGNOSIS — J439 Emphysema, unspecified: Secondary | ICD-10-CM | POA: Diagnosis not present

## 2014-09-11 ENCOUNTER — Telehealth: Payer: Self-pay | Admitting: Internal Medicine

## 2014-09-11 DIAGNOSIS — J9611 Chronic respiratory failure with hypoxia: Secondary | ICD-10-CM

## 2014-09-11 NOTE — Telephone Encounter (Signed)
ATC pt again and line busy [facility]

## 2014-09-11 NOTE — Telephone Encounter (Signed)
Called pt and line busy [facility]

## 2014-09-12 NOTE — Telephone Encounter (Signed)
Ok to order- DME [facility]  Please evaluate patient for portable O2 concentrator 2-3L/M, pulse regulator

## 2014-09-12 NOTE — Telephone Encounter (Signed)
Spoke with the pt's spouse  She states that the pt is needing order for POC She states that [facility] told her that he needs a tank that will give him more oxygen only when he needs it  Please advise Dr Annamaria Boots, thanks

## 2014-09-12 NOTE — Telephone Encounter (Signed)
Order was sent to [facility]   Spoke with the pt's spouse and notified her of this  She verbalized understanding and nothing further needed

## 2014-09-16 ENCOUNTER — Telehealth: Payer: Self-pay | Admitting: Internal Medicine

## 2014-09-16 NOTE — Telephone Encounter (Signed)
s.w pt and advised that Dr. Tyrell Antonio isn on call 5.25. pt ok to come in on 6.21.

## 2014-09-17 ENCOUNTER — Other Ambulatory Visit: Payer: Commercial Managed Care - HMO

## 2014-09-17 ENCOUNTER — Ambulatory Visit: Payer: Commercial Managed Care - HMO | Admitting: Internal Medicine

## 2014-09-19 ENCOUNTER — Ambulatory Visit: Payer: Self-pay

## 2014-09-25 ENCOUNTER — Encounter: Payer: Self-pay | Admitting: Pharmacist

## 2014-09-25 ENCOUNTER — Other Ambulatory Visit: Payer: Self-pay | Admitting: Family Medicine

## 2014-09-25 ENCOUNTER — Ambulatory Visit (INDEPENDENT_AMBULATORY_CARE_PROVIDER_SITE_OTHER): Payer: Commercial Managed Care - HMO | Admitting: Pharmacist

## 2014-09-25 VITALS — BP 136/88 | HR 78 | Ht 69.0 in | Wt 338.0 lb

## 2014-09-25 DIAGNOSIS — I482 Chronic atrial fibrillation, unspecified: Secondary | ICD-10-CM

## 2014-09-25 DIAGNOSIS — E1140 Type 2 diabetes mellitus with diabetic neuropathy, unspecified: Secondary | ICD-10-CM

## 2014-09-25 DIAGNOSIS — I10 Essential (primary) hypertension: Secondary | ICD-10-CM

## 2014-09-25 DIAGNOSIS — E785 Hyperlipidemia, unspecified: Secondary | ICD-10-CM

## 2014-09-25 DIAGNOSIS — Z0000 Encounter for general adult medical examination without abnormal findings: Secondary | ICD-10-CM

## 2014-09-25 DIAGNOSIS — E118 Type 2 diabetes mellitus with unspecified complications: Secondary | ICD-10-CM

## 2014-09-25 HISTORY — DX: Type 2 diabetes mellitus with diabetic neuropathy, unspecified: E11.40

## 2014-09-25 LAB — POCT INR: INR: 2.8

## 2014-09-25 NOTE — Progress Notes (Signed)
Patient ID: Justin Ruiz, male   DOB: Oct 07, 1950, 64 y.o.   MRN: [id number]    Subjective:   REINHART SAULTERS is a 64 y.o. male who presents for an Initial Medicare Annual Wellness Visit and recheck INR.    Current Medications (verified) Outpatient Encounter Prescriptions as of 09/25/2014  Medication Sig  . acetaminophen (TYLENOL) 500 MG tablet Take 1,000 mg by mouth every 6 (six) hours as needed for headache.  . albuterol (PROAIR HFA) 108 (90 BASE) MCG/ACT inhaler Inhale 2 puffs into the lungs every 6 (six) hours as needed for wheezing or shortness of breath.  . ALPRAZolam (XANAX) 0.25 MG tablet TAKE ONE TABLET BY MOUTH AT BEDTIME AS NEEDED  . Blood Glucose Calibration (TAI DOC CONTROL) NORMAL SOLN Use as directed.  . Blood Glucose Monitoring Suppl (CLEVER CHEK AUTO-CODE VOICE) DEVI USE AS DIRECTED  . budesonide-formoterol (SYMBICORT) 160-4.5 MCG/ACT inhaler Inhale 2 puffs into the lungs 2 (two) times daily.  . Cholecalciferol (VITAMIN D3) 5000 UNITS TABS Take 1 tablet by mouth every morning.  Marland Kitchen ipratropium-albuterol (DUONEB) 0.5-2.5 (3) MG/3ML SOLN PLACE 1 VIAL (3 MLS) IN THE NEBULIZER EVERY 6 HOURS AS NEEDED  . Lancet Devices (LANCING DEVICE) MISC USE AS DIRECTED  . metFORMIN (GLUCOPHAGE) 500 MG tablet TAKE ONE TABLET BY MOUTH IN THE MORNING AND TWO IN THE EVENING  . metoprolol (LOPRESSOR) 100 MG tablet Take 1 tablet (100 mg total) by mouth 2 (two) times daily.  . simvastatin (ZOCOR) 40 MG tablet Take 1 tablet (40 mg total) by mouth daily. (Patient taking differently: Take 40 mg by mouth every evening. )  . warfarin (COUMADIN) 5 MG tablet TAKE ONE & ONE-HALF TABS BY PO QD ON  MONDAY AND  FRIDAY & THEN TAKE 1 TAB THE REST OF THE WEEK  . [DISCONTINUED] enalapril (VASOTEC) 20 MG tablet TAKE ONE TABLET BY MOUTH ONCE DAILY  . [DISCONTINUED] NOVOLIN N RELION 100 UNIT/ML injection INJECT 0.32MLS (32 UNITS) INTO THE SKIN TWICE DAILY BEFORE A MEAL  . [DISCONTINUED] Albuterol Sulfate (PROAIR  RESPICLICK) 557 (90 BASE) MCG/ACT AEPB Inhale 2 puffs into the lungs every 4 (four) hours as needed. (Patient not taking: Reported on 09/25/2014)   No facility-administered encounter medications on file as of 09/25/2014.    Allergies (verified) Avapro and Lipitor   History: Past Medical History  Diagnosis Date  . Diabetes mellitus   . Asthma   . Hyperlipidemia   . Obesity     exogenous  . BPH (benign prostatic hypertrophy)   . Colon polyps   . Shingles   . GERD (gastroesophageal reflux disease)   . Arthritis   . Asthma   . HTN (hypertension)     x 3 years  . Lung nodules 03/11/2013  . Mediastinal adenopathy 06/20/2013    CT  & PET 2/15   . Cancer   . NHL (non-Hodgkin's lymphoma)     nhl dx 9/11  . History of oxygen administration     occ. oxygen use at 2 l/m as needed- not using at this time  . Sleep apnea     recent study 04-07-14 awaiting results, no cpap yet.-Dr. Glynn Octave  . COPD (chronic obstructive pulmonary disease)    Past Surgical History  Procedure Laterality Date  . Appendectomy    . Colonoscopy with propofol N/A 05/05/2014    Procedure: COLONOSCOPY WITH PROPOFOL;  Surgeon: Inda Castle, MD;  Location: [facility];  Service: Endoscopy;  Laterality: N/A;   Family History  Problem  Relation Age of Onset  . Liver cancer Mother   . Colon cancer Father   . Prostate cancer Father   . Colon polyps Father   . Pulmonary embolism Sister   . Heart attack Sister   . Aortic aneurysm Sister   . COPD Sister   . Heart disease Sister   . COPD Sister   . Heart disease Sister   . Diabetes Maternal Grandmother    Social History   Occupational History  . Not on file.   Social History Main Topics  . Smoking status: Former Smoker -- 2.00 packs/day    Start date: 12/17/1968    Quit date: 11/14/2004  . Smokeless tobacco: Never Used     Comment: 2 ppd   . Alcohol Use: No     Comment: previous  . Drug Use: No  . Sexual Activity: Yes    Do you feel safe at home?   Yes  Dietary issues and exercise activities: Current Exercise Habits:: Home exercise routine, Type of exercise: walking, Time (Minutes): 10, Frequency (Times/Week): 1, Weekly Exercise (Minutes/Week): 10, Intensity: Mild  Current Dietary habits:  He has changed from white potatoes to sweet potatoes.   Objective:    Today's Vitals   09/25/14 0925  BP: 136/88  Pulse: 78  Height: _0  (1.753 m)  Weight: 338 lb (153.316 kg)  PainSc: 2   PainLoc: Foot   Body mass index is 49.89 kg/(m^2).  Activities of Daily Living In your present state of health, do you have any difficulty performing the following activities: 09/25/2014 09/25/2014  Hearing? N N  Vision? N N  Difficulty concentrating or making decisions? N -  Walking or climbing stairs? Y -  Dressing or bathing? N -  Doing errands, shopping? N -  Preparing Food and eating ? N -  Using the Toilet? N -  In the past six months, have you accidently leaked urine? N -  Do you have problems with loss of bowel control? N -  Managing your Medications? N -  Managing your Finances? N -  Housekeeping or managing your Housekeeping? N -    Are there smokers in your home (other than you)? No   Cardiac Risk Factors include: advanced age (>37mn, >>10women);obesity (BMI >30kg/m2);sedentary lifestyle;diabetes mellitus;dyslipidemia;family history of premature cardiovascular disease;hypertension;male gender  Depression Screen PHQ 2/9 Scores 09/25/2014 08/11/2014 04/03/2014 11/28/2013  PHQ - 2 Score 2 0 1 0  PHQ- 9 Score 3 - - -    Fall Risk Fall Risk  09/25/2014 08/11/2014 04/03/2014 03/19/2014 11/28/2013  Falls in the past year? _1     Cognitive Function: MMSE - Mini Mental State Exam 09/25/2014  Orientation to time 5  Orientation to Place 5  Registration 3  Attention/ Calculation 5  Recall 3  Language- name 2 objects 2  Language- repeat 1  Language- follow 3 step command 3  Language- read & follow direction 1  Write a sentence 1    Copy design 1  Total score 30    Immunizations and Health Maintenance Immunization History  Administered Date(s) Administered  . Influenza Split 01/09/2012  . Influenza Whole 01/23/2009, 01/24/2011  . Influenza,inj,Quad PF,36+ Mos 01/30/2013, 01/24/2014  . Pneumococcal Conjugate-13 02/20/2014  . Pneumococcal Polysaccharide-23 02/24/2011  . Td 04/25/2004  . Tdap 09/24/2010   There are no preventive care reminders to display for this patient.  Patient Care Team: DChipper Herb MD as PCP - General (Family Medicine) MCurt Bears MD as  Consulting Physician (Oncology) Minus Breeding, MD as Consulting Physician (Cardiology) Deneise Lever, MD as Consulting Physician (Pulmonary Disease) Truc Manus Gunning, OD as Consulting Physician (Optometry) Sandford Craze, MD as Referring Physician (Unknown Physician Specialty)  Indicate any recent Medical Services you may have received from other than [facility] providers in the past year (date may be approximate).    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 ZOSTAVAX  11/29/2014 (Originally 12/18/2010)  . INFLUENZA VACCINE  11/24/2014  . URINE MICROALBUMIN  11/29/2014  . HEMOGLOBIN A1C  02/10/2015  . COLON CANCER SCREENING ANNUAL FOBT  05/06/2015  . OPHTHALMOLOGY EXAM  07/21/2015  . FOOT EXAM  08/11/2015  . PNEUMOCOCCAL POLYSACCHARIDE VACCINE (2) 02/24/2016  . TETANUS/TDAP  09/23/2020  . COLONOSCOPY  05/05/2024  . HIV Screening  Completed        Plan:   During the course of the visit Alter was educated and counseled about the following appropriate screening and preventive services:   Vaccines to include Pneumoccal, Influenza, Hepatitis B, Td, Zostavax - UTD;  Zostavax not required because patient has had shingles.  Colorectal cancer screening - colonoscopy and FOBT  UTD  Cardiovascular disease screening - UTD;  LDL at goal;  Tg elevated - discussed diet changes that could help with both triglycerides and BG such as decrease CHO intake - breads, potatoes, rice and pasta.  Increase non starchy vegetables and fruits and lean proteins such as lentils, eggs, fish , chicken without skin and [place].  Diabetes screening - UTd  Glaucoma screening / Diabetic Eye Exam - UTD - last visit requested from Dr Rona Ravens in [place]  Nutrition counseling - see dietary recommendations below in goals.  Good job with weight loss over last 2 months.   Advanced Directives - information discussed and given  Orders Placed This Encounter  Procedures  . Microalbumin / creatinine urine ratio  . POCT INR    Standing Status: Standing     Number of Occurrences: 52     Standing Expiration Date: 10/16/2015      Goals    . Weight < 300 lb (136.079 kg)      Increase non-starchy vegetables - carrots, green bean, squash, zucchini, tomatoes, onions, peppers, spinach and other green leafy vegetables, cabbage, lettuce, cucumbers, asparagus, okra (not fried), eggplant limit sugar and processed foods (cakes, cookies, ice cream, crackers and chips) Increase fresh fruit but limit serving sizes 1/2 cup or about the size of tennis or baseball limit red meat to no more than 1-2 times per week (serving size about the size of your palm) Choose whole grains / lean proteins - whole wheat bread, quinoa, whole grain rice (1/2 cup), fish, chicken, [place]         Patient Instructions (the written plan) were given to the patient.   Cherre Robins, [facility]   09/25/2014

## 2014-09-25 NOTE — Patient Instructions (Addendum)
  Justin Ruiz , Thank you for taking time to come for your Medicare Wellness Visit. I appreciate your ongoing commitment to your health goals. Please review the following plan we discussed and let me know if I can assist you in the future.   These are the goals we discussed: Goals    None      This is a list of the screening recommended for you and due dates:  Health Maintenance  Topic Date Due  . Shingles Vaccine  11/29/2014*  . Flu Shot  11/24/2014  . Urine Protein Check  11/29/2014  . Hemoglobin A1C  02/10/2015  . Stool Blood Test  05/06/2015  . Eye exam for diabetics  07/21/2015  . Complete foot exam   08/11/2015  . Pneumococcal vaccine (2) 02/24/2016  . Tetanus Vaccine  09/23/2020  . Colon Cancer Screening  05/05/2024  . HIV Screening  Completed  *Topic was postponed. The date shown is not the original due date.    Anticoagulation Dose Instructions as of 09/25/2014      Dorene Grebe Tue Wed Thu Fri Sat   New Dose 5 mg 7.5 mg 5 mg 5 mg 5 mg 7.5 mg 5 mg    Description        Continue warfarin 7.5mg  mondays and fridays and 5mg  all other days.     INR was 2.8 today

## 2014-09-26 ENCOUNTER — Encounter: Payer: Self-pay | Admitting: *Deleted

## 2014-09-26 LAB — MICROALBUMIN / CREATININE URINE RATIO
Creatinine, Urine: 126.2 mg/dL
MICROALB/CREAT RATIO: 108.8 mg/g creat — ABNORMAL HIGH (ref 0.0–30.0)
Microalbumin, Urine: 137.3 ug/mL

## 2014-10-01 DIAGNOSIS — Z23 Encounter for immunization: Secondary | ICD-10-CM | POA: Diagnosis not present

## 2014-10-01 DIAGNOSIS — G4719 Other hypersomnia: Secondary | ICD-10-CM | POA: Diagnosis not present

## 2014-10-01 DIAGNOSIS — G4733 Obstructive sleep apnea (adult) (pediatric): Secondary | ICD-10-CM | POA: Diagnosis not present

## 2014-10-01 DIAGNOSIS — J441 Chronic obstructive pulmonary disease with (acute) exacerbation: Secondary | ICD-10-CM | POA: Diagnosis not present

## 2014-10-07 DIAGNOSIS — J449 Chronic obstructive pulmonary disease, unspecified: Secondary | ICD-10-CM | POA: Diagnosis not present

## 2014-10-07 DIAGNOSIS — J9611 Chronic respiratory failure with hypoxia: Secondary | ICD-10-CM | POA: Diagnosis not present

## 2014-10-10 DIAGNOSIS — J449 Chronic obstructive pulmonary disease, unspecified: Secondary | ICD-10-CM | POA: Diagnosis not present

## 2014-10-10 DIAGNOSIS — J439 Emphysema, unspecified: Secondary | ICD-10-CM | POA: Diagnosis not present

## 2014-10-14 ENCOUNTER — Encounter: Payer: Self-pay | Admitting: Internal Medicine

## 2014-10-14 ENCOUNTER — Ambulatory Visit (HOSPITAL_BASED_OUTPATIENT_CLINIC_OR_DEPARTMENT_OTHER): Payer: Commercial Managed Care - HMO | Admitting: Internal Medicine

## 2014-10-14 ENCOUNTER — Other Ambulatory Visit (HOSPITAL_BASED_OUTPATIENT_CLINIC_OR_DEPARTMENT_OTHER): Payer: Commercial Managed Care - HMO

## 2014-10-14 ENCOUNTER — Telehealth: Payer: Self-pay | Admitting: Internal Medicine

## 2014-10-14 ENCOUNTER — Telehealth: Payer: Self-pay | Admitting: *Deleted

## 2014-10-14 VITALS — BP 131/80 | HR 82 | Temp 97.9°F | Resp 18 | Ht 69.0 in | Wt 339.6 lb

## 2014-10-14 DIAGNOSIS — C8590 Non-Hodgkin lymphoma, unspecified, unspecified site: Secondary | ICD-10-CM

## 2014-10-14 DIAGNOSIS — Z8572 Personal history of non-Hodgkin lymphomas: Secondary | ICD-10-CM

## 2014-10-14 DIAGNOSIS — R0602 Shortness of breath: Secondary | ICD-10-CM

## 2014-10-14 LAB — COMPREHENSIVE METABOLIC PANEL (CC13)
ALBUMIN: 3.3 g/dL — AB (ref 3.5–5.0)
ALK PHOS: 72 U/L (ref 40–150)
ALT: 16 U/L (ref 0–55)
AST: 16 U/L (ref 5–34)
Anion Gap: 7 mEq/L (ref 3–11)
BUN: 21.1 mg/dL (ref 7.0–26.0)
CO2: 29 meq/L (ref 22–29)
Calcium: 9.1 mg/dL (ref 8.4–10.4)
Chloride: 101 mEq/L (ref 98–109)
Creatinine: 0.9 mg/dL (ref 0.7–1.3)
EGFR: >90 mL/min/{1.73_m2} (ref >90)
Glucose: 260 mg/dl — ABNORMAL HIGH (ref 70–140)
POTASSIUM: 4.9 meq/L (ref 3.5–5.1)
SODIUM: 137 meq/L (ref 136–145)
TOTAL PROTEIN: 6.8 g/dL (ref 6.4–8.3)
Total Bilirubin: 0.99 mg/dL (ref 0.20–1.20)

## 2014-10-14 LAB — CBC WITH DIFFERENTIAL/PLATELET
BASO%: 0.9 % (ref 0.0–2.0)
BASOS ABS: 0 10*3/uL (ref 0.0–0.1)
EOS ABS: 0.1 10*3/uL (ref 0.0–0.5)
EOS%: 1.5 % (ref 0.0–7.0)
HCT: 45.2 % (ref 38.4–49.9)
HEMOGLOBIN: 15 g/dL (ref 13.0–17.1)
LYMPH%: 26 % (ref 14.0–49.0)
MCH: 31 pg (ref 27.2–33.4)
MCHC: 33.2 g/dL (ref 32.0–36.0)
MCV: 93.4 fL (ref 79.3–98.0)
MONO#: 0.3 10*3/uL (ref 0.1–0.9)
MONO%: 5.7 % (ref 0.0–14.0)
NEUT%: 65.9 % (ref 39.0–75.0)
NEUTROS ABS: 3.6 10*3/uL (ref 1.5–6.5)
PLATELETS: 143 10*3/uL (ref 140–400)
RBC: 4.84 10*6/uL (ref 4.20–5.82)
RDW: 14.7 % — ABNORMAL HIGH (ref 11.0–14.6)
WBC: 5.4 10*3/uL (ref 4.0–10.3)
lymph#: 1.4 10*3/uL (ref 0.9–3.3)

## 2014-10-14 LAB — LACTATE DEHYDROGENASE (CC13): LDH: 170 U/L (ref 125–245)

## 2014-10-14 NOTE — Progress Notes (Signed)
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 2) hypermetabolic mediastinal lymphadenopathy.  PRIOR THERAPY: Status post systemic chemotherapy with 6 cycles of infusional CHOPE with Rituxan.  CURRENT THERAPY: Observation  INTERVAL HISTORY: Justin Ruiz 64 y.o. male returns to the clinic today for followup visit accompanied by his wife. The patient is feeling well today with no specific complaints except for mild fatigue as well as shortness of breath. He is currently on home oxygen but his oxygen concentrator was not working properly. His oxygen saturation the clinic was down to 84% but improved with oxygen nasal cannula to 94%. He denied having any significant weight loss or night sweats. He has no palpable lymphadenopathy. He has no bleeding issues. He has no chest pain but continues to have shortness breath with exertion with no cough or hemoptysis. He had repeat CBC, comprehensive metabolic panel and LDH performed earlier today and he is here for evaluation and discussion of his scan results.  MEDICAL HISTORY: Past Medical History  Diagnosis Date  . Diabetes mellitus   . Asthma   . Hyperlipidemia   . Obesity     exogenous  . BPH (benign prostatic hypertrophy)   . Colon polyps   . Shingles   . GERD (gastroesophageal reflux disease)   . Arthritis   . Asthma   . HTN (hypertension)     x 3 years  . Lung nodules 03/11/2013  . Mediastinal adenopathy 06/20/2013    CT  & PET 2/15   . Cancer   . NHL (non-Hodgkin's lymphoma)     nhl dx 9/11  . History of oxygen administration     occ. oxygen use at 2 l/m as needed- not using at this time  . Sleep apnea     recent study 04-07-14 awaiting results, no cpap yet.-Dr. Glynn Octave  . COPD (chronic obstructive pulmonary disease)      ALLERGIES:  is allergic to avapro and lipitor.  MEDICATIONS:  Current Outpatient Prescriptions  Medication Sig Dispense Refill  . acetaminophen (TYLENOL) 500 MG tablet Take 1,000 mg by mouth every 6 (six) hours as needed for headache.    . albuterol (PROAIR HFA) 108 (90 BASE) MCG/ACT inhaler Inhale 2 puffs into the lungs every 6 (six) hours as needed for wheezing or shortness of breath. 8.5 g 5  . ALPRAZolam (XANAX) 0.25 MG tablet TAKE ONE TABLET BY MOUTH AT BEDTIME AS NEEDED 30 tablet 1  . Blood Glucose Calibration (TAI DOC CONTROL) NORMAL SOLN Use as directed.    . Blood Glucose Monitoring Suppl (CLEVER CHEK AUTO-CODE VOICE) DEVI USE AS DIRECTED 1 each 2  . budesonide-formoterol (SYMBICORT) 160-4.5 MCG/ACT inhaler Inhale 2 puffs into the lungs 2 (two) times daily. 1 Inhaler 3  . Cholecalciferol (VITAMIN D3) 5000 UNITS TABS Take 1 tablet by mouth every morning.    . enalapril (VASOTEC) 20 MG tablet TAKE ONE TABLET BY MOUTH ONCE DAILY 30 tablet 4  . ipratropium-albuterol (DUONEB) 0.5-2.5 (3) MG/3ML SOLN PLACE 1 VIAL (3 MLS) IN THE NEBULIZER EVERY 6 HOURS AS NEEDED 90 mL 0  . Lancet [employer] (LANCING DEVICE) MISC USE AS DIRECTED 1 each 2  . metFORMIN (GLUCOPHAGE) 500 MG tablet TAKE ONE TABLET BY MOUTH IN THE MORNING AND TWO IN THE EVENING 270 tablet 3  .  metoprolol (LOPRESSOR) 100 MG tablet Take 1 tablet (100 mg total) by mouth 2 (two) times daily. 180 tablet 3  . NOVOLIN N RELION 100 UNIT/ML injection INJECT 32 UNITS SUBCUTANEOUSLY TWICE DAILY BEFORE  A  MEAL 40 mL 2  . NOVOLIN R RELION 100 UNIT/ML injection 100 Units.    . simvastatin (ZOCOR) 40 MG tablet Take 1 tablet (40 mg total) by mouth daily. (Patient taking differently: Take 40 mg by mouth every evening. ) 90 tablet 3  . warfarin (COUMADIN) 5 MG tablet TAKE ONE & ONE-HALF TABS BY PO QD ON  MONDAY AND  FRIDAY & THEN TAKE 1 TAB THE REST OF THE WEEK 140 tablet 1  . [DISCONTINUED] digoxin (LANOXIN) 0.25 MG tablet Take 250 mcg by mouth  daily.      No current facility-administered medications for this visit.    SURGICAL HISTORY:  Past Surgical History  Procedure Laterality Date  . Appendectomy    . Colonoscopy with propofol N/A 05/05/2014    Procedure: COLONOSCOPY WITH PROPOFOL;  Surgeon: Inda Castle, MD;  Location: [facility];  Service: Endoscopy;  Laterality: N/A;    REVIEW OF SYSTEMS:  Constitutional: negative Eyes: negative Ears, nose, mouth, throat, and face: negative Respiratory: positive for dyspnea on exertion Cardiovascular: negative Gastrointestinal: negative Genitourinary:negative Integument/breast: negative Hematologic/lymphatic: negative Musculoskeletal:negative Neurological: negative Behavioral/Psych: negative Endocrine: negative Allergic/Immunologic: negative   PHYSICAL EXAMINATION: General appearance: alert, cooperative and no distress Head: Normocephalic, without obvious abnormality, atraumatic Neck: no adenopathy, no JVD, supple, symmetrical, trachea midline and thyroid not enlarged, symmetric, no tenderness/mass/nodules Lymph nodes: Cervical, supraclavicular, and axillary nodes normal. Resp: clear to auscultation bilaterally Back: symmetric, no curvature. ROM normal. No CVA tenderness. Cardio: regular rate and rhythm, S1, S2 normal, no murmur, click, rub or gallop GI: soft, non-tender; bowel sounds normal; no masses,  no organomegaly Extremities: extremities normal, atraumatic, no cyanosis or edema Neurologic: Alert and oriented X 3, normal strength and tone. Normal symmetric reflexes. Normal coordination and gait  ECOG PERFORMANCE STATUS: 1 - Symptomatic but completely ambulatory  Blood pressure 131/80, pulse 82, temperature 97.9 F (36.6 C), temperature source Oral, resp. rate 18, height _0  (1.753 m), weight 339 lb 9.6 oz (154.042 kg), SpO2 93 %.  LABORATORY DATA: Lab Results  Component Value Date   WBC 5.4 10/14/2014   HGB 15.0 10/14/2014   HCT 45.2 10/14/2014   MCV  93.4 10/14/2014   PLT 143 10/14/2014      Chemistry      Component Value Date/Time   NA 141 08/11/2014 0952   NA 141 03/17/2014 0806   NA 141 09/06/2013 0800   NA 136 11/01/2011 0753   K 5.0 08/11/2014 0952   K 4.7 09/06/2013 0800   K 4.8* 11/01/2011 0753   CL 98 08/11/2014 0952   CL 100 09/03/2012 0751   CL 91* 11/01/2011 0753   CO2 27 08/11/2014 0952   CO2 25 09/06/2013 0800   CO2 34* 11/01/2011 0753   BUN 16 08/11/2014 0952   BUN 19 03/17/2014 0806   BUN 22.2 09/06/2013 0800   BUN 18 11/01/2011 0753   CREATININE 0.88 08/11/2014 0952   CREATININE 0.9 09/06/2013 0800   CREATININE 0.82 11/14/2012 0918   CREATININE 0.8 03/29/2012   GLU 185 03/29/2012      Component Value Date/Time   CALCIUM 9.8 08/11/2014 0952   CALCIUM 10.1 09/06/2013 0800   CALCIUM 8.8 11/01/2011 0753   ALKPHOS 63 08/11/2014 0952   ALKPHOS 59 09/06/2013 0800  ALKPHOS 57 11/01/2011 0753   AST 18 08/11/2014 0952   AST 13 09/06/2013 0800   AST 14 11/01/2011 0753   ALT 17 08/11/2014 0952   ALT 14 09/06/2013 0800   ALT 17 11/01/2011 0753   BILITOT 1.2 08/11/2014 0952   BILITOT 1.0 04/03/2014 0813   BILITOT 0.57 09/06/2013 0800   BILITOT 1.50 11/01/2011 0753       RADIOGRAPHIC STUDIES: No results found.  ASSESSMENT AND PLAN: This is a very pleasant 64 years old white male with history of high-grade be set non-Hodgkin lymphoma suspicious for Burkitt's lymphoma but with negative cytogenetics for 8;14 abnormality status post 6 cycles of systemic chemotherapy with Rituxan and infusional CHOPE completed in early 2012.  He has been observation since that time with no evidence for disease recurrence  His recent blood work is unremarkable. I discussed the lab results with the patient and his wife. I recommended for him to continue on observation with repeat CBC, comprehensive metabolic panel and LDH as well as CT scan of the chest, abdomen and pelvis in 6 months. For the shortness of breath, we will  arrange for the patient to have an oxygen tank available to take home and we will also ask his homecare to arrange for a different oxygen concentrator. He was advised to call immediately if he has any concerning symptoms in the interval. The patient voices understanding of current disease status and treatment options and is in agreement with the current care plan.  All questions were answered. The patient knows to call the clinic with any problems, questions or concerns. We can certainly see the patient much sooner if necessary.  Disclaimer: This note was dictated with voice recognition software. Similar sounding words can inadvertently be transcribed and may not be corrected upon review.

## 2014-10-14 NOTE — Telephone Encounter (Signed)
Pt here for office visit today, PAO2 84% after walking 50 feet. Pt advised he does not have enough oxygen after leaving to get home.  [facility] team called [phone] to discuss getting pt portable oxygen tank to get home. Darlene advised a service rep is in [place] and will bring pt tank to cancer center. Cost $15.18 for the rental. Pt and wife are agreeable with this plan.

## 2014-10-14 NOTE — Telephone Encounter (Signed)
per pof to sch pt appt-gave pt contrast and avs

## 2014-10-30 ENCOUNTER — Encounter: Payer: Self-pay | Admitting: Internal Medicine

## 2014-10-31 DIAGNOSIS — Z23 Encounter for immunization: Secondary | ICD-10-CM | POA: Diagnosis not present

## 2014-10-31 DIAGNOSIS — G4733 Obstructive sleep apnea (adult) (pediatric): Secondary | ICD-10-CM | POA: Diagnosis not present

## 2014-10-31 DIAGNOSIS — G4719 Other hypersomnia: Secondary | ICD-10-CM | POA: Diagnosis not present

## 2014-10-31 DIAGNOSIS — J441 Chronic obstructive pulmonary disease with (acute) exacerbation: Secondary | ICD-10-CM | POA: Diagnosis not present

## 2014-11-04 ENCOUNTER — Encounter: Payer: Self-pay | Admitting: Physician Assistant

## 2014-11-04 ENCOUNTER — Ambulatory Visit (INDEPENDENT_AMBULATORY_CARE_PROVIDER_SITE_OTHER): Payer: Commercial Managed Care - HMO | Admitting: Physician Assistant

## 2014-11-04 VITALS — BP 167/101 | HR 87 | Temp 97.2°F | Ht 69.0 in | Wt 338.0 lb

## 2014-11-04 DIAGNOSIS — L089 Local infection of the skin and subcutaneous tissue, unspecified: Secondary | ICD-10-CM

## 2014-11-04 MED ORDER — CEPHALEXIN 500 MG PO CAPS: 500.0000 mg | ORAL_CAPSULE | Freq: Four times a day (QID) | ORAL | Status: DC

## 2014-11-04 NOTE — Progress Notes (Signed)
   Subjective:    Patient ID: Justin Ruiz, male    DOB: Jul 11, 1950, 64 y.o.   MRN: [id number]  HPI 64 y/o presents with c/o right big toe pain x 3 weeks ago. The toenail began to rise and eventually fell off. The nail became thick and eventually fell off. Purulent drainage. He has comorbid insulin dependant DM     Review of Systems  Constitutional: Negative for fever, chills, diaphoresis and fatigue.  Gastrointestinal: Negative for nausea and vomiting.  Skin:       Right big toe pain, redness and drainage.        Objective:   Physical Exam  Musculoskeletal: He exhibits edema and tenderness (right big toe ).  Skin:  Nail bed absent Purulent drainage on dorsal right big toe +erythema Distal pulse present  Negative for necrosis           Assessment & Plan:  1. Toe infection  - cephALEXin (KEFLEX) 500 MG capsule; Take 1 capsule (500 mg total) by mouth 4 (four) times daily.  Dispense: 40 capsule; Refill: 0 - Aerobic culture Epsom salt soaks - instructions given   RTC 2 weeks. If redness and drainage increase, rtc sooner for reassessment    Zerah Hilyer A. Benjamin Stain PA-C

## 2014-11-04 NOTE — Patient Instructions (Signed)
Clean with gentle dove soap. No dial, peroxide, alcohol  Infected Ingrown Toenail An infected ingrown toenail occurs when the nail edge grows into the skin and bacteria invade the area. Symptoms include pain, tenderness, swelling, and pus drainage from the edge of the nail. Poorly fitting shoes, minor injuries, and improper cutting of the toenail may also contribute to the problem. You should cut your toenails squarely instead of rounding the edges. Do not cut them too short. Avoid tight or pointed toe shoes. Sometimes the ingrown portion of the nail must be removed. If your toenail is removed, it can take 3-4 months for it to re-grow. HOME CARE INSTRUCTIONS   Soak your infected toe in warm water for 20-30 minutes, 2 to 3 times a day.  Packing or dressings applied to the area should be changed daily.  Take medicine as directed and finish them.  Reduce activities and keep your foot elevated when able to reduce swelling and discomfort. Do this until the infection gets better.  Wear sandals or go barefoot as much as possible while the infected area is sensitive.  See your caregiver for follow-up care in 2-3 days if the infection is not better. SEEK MEDICAL CARE IF:  Your toe is becoming more red, swollen or painful. MAKE SURE YOU:   Understand these instructions.  Will watch your condition.  Will get help right away if you are not doing well or get worse. Document Released: 05/19/2004 Document Revised: 07/04/2011 Document Reviewed: 04/07/2008 [facility] Patient Information 2015 [place], [place]. This information is not intended to replace advice given to you by your health care provider. Make sure you discuss any questions you have with your health care provider.

## 2014-11-05 ENCOUNTER — Telehealth: Payer: Self-pay | Admitting: Pharmacist

## 2014-11-05 ENCOUNTER — Ambulatory Visit (INDEPENDENT_AMBULATORY_CARE_PROVIDER_SITE_OTHER): Payer: Commercial Managed Care - HMO | Admitting: Pharmacist

## 2014-11-05 DIAGNOSIS — R809 Proteinuria, unspecified: Secondary | ICD-10-CM | POA: Diagnosis not present

## 2014-11-05 DIAGNOSIS — I482 Chronic atrial fibrillation, unspecified: Secondary | ICD-10-CM

## 2014-11-05 DIAGNOSIS — Z7901 Long term (current) use of anticoagulants: Secondary | ICD-10-CM

## 2014-11-05 LAB — POCT INR: INR: 2.5

## 2014-11-05 NOTE — Patient Instructions (Addendum)
Anticoagulation Dose Instructions as of 11/05/2014      Dorene Grebe Tue Wed Thu Fri Sat   New Dose 5 mg 7.5 mg 5 mg 5 mg 5 mg 7.5 mg 5 mg    Description        Continue warfarin 7.5mg  mondays and fridays and 5mg  all other days.     INR was 2.5 today

## 2014-11-06 DIAGNOSIS — J9611 Chronic respiratory failure with hypoxia: Secondary | ICD-10-CM | POA: Diagnosis not present

## 2014-11-06 DIAGNOSIS — J449 Chronic obstructive pulmonary disease, unspecified: Secondary | ICD-10-CM | POA: Diagnosis not present

## 2014-11-07 LAB — AEROBIC CULTURE

## 2014-11-07 LAB — PROTEIN / CREATININE RATIO, URINE
Creatinine, Urine: 190.1 mg/dL
PROTEIN/CREAT RATIO: 410 mg/g{creat} — AB (ref 0–200)
Protein, Ur: 78 mg/dL

## 2014-11-09 DIAGNOSIS — J449 Chronic obstructive pulmonary disease, unspecified: Secondary | ICD-10-CM | POA: Diagnosis not present

## 2014-11-09 DIAGNOSIS — J439 Emphysema, unspecified: Secondary | ICD-10-CM | POA: Diagnosis not present

## 2014-11-10 ENCOUNTER — Other Ambulatory Visit: Payer: Self-pay | Admitting: *Deleted

## 2014-11-10 ENCOUNTER — Other Ambulatory Visit: Payer: Self-pay

## 2014-11-10 MED ORDER — METOPROLOL TARTRATE 100 MG PO TABS: 100.0000 mg | ORAL_TABLET | Freq: Two times a day (BID) | ORAL | Status: DC

## 2014-11-10 MED ORDER — ENALAPRIL MALEATE 20 MG PO TABS: 20.0000 mg | ORAL_TABLET | Freq: Every day | ORAL | Status: DC

## 2014-11-10 MED ORDER — WARFARIN SODIUM 5 MG PO TABS
ORAL_TABLET | ORAL | Status: DC
Start: 2014-11-10 — End: 2015-08-12

## 2014-11-10 MED ORDER — IPRATROPIUM-ALBUTEROL 0.5-2.5 (3) MG/3ML IN SOLN: RESPIRATORY_TRACT | Status: DC

## 2014-11-10 MED ORDER — SIMVASTATIN 40 MG PO TABS: 40.0000 mg | ORAL_TABLET | Freq: Every day | ORAL | Status: DC

## 2014-11-10 MED ORDER — METFORMIN HCL 500 MG PO TABS: ORAL_TABLET | ORAL | Status: DC

## 2014-11-10 NOTE — Telephone Encounter (Signed)
Spoke with patient to make sure there wasn't any rx needed and he states no.

## 2014-11-12 ENCOUNTER — Other Ambulatory Visit: Payer: Self-pay | Admitting: Pharmacist

## 2014-11-12 DIAGNOSIS — R809 Proteinuria, unspecified: Secondary | ICD-10-CM

## 2014-12-01 DIAGNOSIS — J441 Chronic obstructive pulmonary disease with (acute) exacerbation: Secondary | ICD-10-CM | POA: Diagnosis not present

## 2014-12-01 DIAGNOSIS — G4719 Other hypersomnia: Secondary | ICD-10-CM | POA: Diagnosis not present

## 2014-12-01 DIAGNOSIS — Z23 Encounter for immunization: Secondary | ICD-10-CM | POA: Diagnosis not present

## 2014-12-01 DIAGNOSIS — G4733 Obstructive sleep apnea (adult) (pediatric): Secondary | ICD-10-CM | POA: Diagnosis not present

## 2014-12-03 DIAGNOSIS — G4733 Obstructive sleep apnea (adult) (pediatric): Secondary | ICD-10-CM | POA: Diagnosis not present

## 2014-12-07 DIAGNOSIS — J449 Chronic obstructive pulmonary disease, unspecified: Secondary | ICD-10-CM | POA: Diagnosis not present

## 2014-12-07 DIAGNOSIS — J9611 Chronic respiratory failure with hypoxia: Secondary | ICD-10-CM | POA: Diagnosis not present

## 2014-12-10 DIAGNOSIS — J449 Chronic obstructive pulmonary disease, unspecified: Secondary | ICD-10-CM | POA: Diagnosis not present

## 2014-12-10 DIAGNOSIS — J439 Emphysema, unspecified: Secondary | ICD-10-CM | POA: Diagnosis not present

## 2014-12-12 ENCOUNTER — Ambulatory Visit (INDEPENDENT_AMBULATORY_CARE_PROVIDER_SITE_OTHER): Payer: Commercial Managed Care - HMO | Admitting: Pharmacist

## 2014-12-12 DIAGNOSIS — Z7901 Long term (current) use of anticoagulants: Secondary | ICD-10-CM

## 2014-12-12 DIAGNOSIS — I482 Chronic atrial fibrillation, unspecified: Secondary | ICD-10-CM

## 2014-12-12 LAB — POCT INR: INR: 2.6

## 2014-12-12 NOTE — Patient Instructions (Addendum)
Anticoagulation Dose Instructions as of 12/12/2014      Justin Ruiz Tue Wed Thu Fri Sat   New Dose 5 mg 7.5 mg 5 mg 5 mg 5 mg 7.5 mg 5 mg    Description        Continue warfarin 7.5mg  mondays and fridays and 5mg  all other days.     INR was 2.6 today

## 2014-12-25 ENCOUNTER — Other Ambulatory Visit: Payer: Self-pay | Admitting: Family

## 2014-12-26 ENCOUNTER — Other Ambulatory Visit: Payer: Self-pay | Admitting: *Deleted

## 2014-12-26 ENCOUNTER — Encounter: Payer: Self-pay | Admitting: Family Medicine

## 2014-12-26 ENCOUNTER — Ambulatory Visit (INDEPENDENT_AMBULATORY_CARE_PROVIDER_SITE_OTHER): Payer: Commercial Managed Care - HMO | Admitting: Family Medicine

## 2014-12-26 ENCOUNTER — Telehealth: Payer: Self-pay | Admitting: Internal Medicine

## 2014-12-26 VITALS — BP 141/72 | HR 80 | Temp 97.4°F | Ht 69.0 in | Wt 332.6 lb

## 2014-12-26 DIAGNOSIS — J9611 Chronic respiratory failure with hypoxia: Secondary | ICD-10-CM

## 2014-12-26 DIAGNOSIS — E119 Type 2 diabetes mellitus without complications: Secondary | ICD-10-CM | POA: Diagnosis not present

## 2014-12-26 DIAGNOSIS — C8590 Non-Hodgkin lymphoma, unspecified, unspecified site: Secondary | ICD-10-CM

## 2014-12-26 DIAGNOSIS — I10 Essential (primary) hypertension: Secondary | ICD-10-CM | POA: Diagnosis not present

## 2014-12-26 DIAGNOSIS — I480 Paroxysmal atrial fibrillation: Secondary | ICD-10-CM | POA: Diagnosis not present

## 2014-12-26 DIAGNOSIS — Z794 Long term (current) use of insulin: Secondary | ICD-10-CM | POA: Diagnosis not present

## 2014-12-26 DIAGNOSIS — G4733 Obstructive sleep apnea (adult) (pediatric): Secondary | ICD-10-CM

## 2014-12-26 DIAGNOSIS — E118 Type 2 diabetes mellitus with unspecified complications: Secondary | ICD-10-CM

## 2014-12-26 DIAGNOSIS — E785 Hyperlipidemia, unspecified: Secondary | ICD-10-CM

## 2014-12-26 DIAGNOSIS — E1140 Type 2 diabetes mellitus with diabetic neuropathy, unspecified: Secondary | ICD-10-CM

## 2014-12-26 LAB — POCT GLYCOSYLATED HEMOGLOBIN (HGB A1C): Hemoglobin A1C: 7.4

## 2014-12-26 MED ORDER — ALPRAZOLAM 0.25 MG PO TABS: 0.2500 mg | ORAL_TABLET | Freq: Every evening | ORAL | Status: DC | PRN

## 2014-12-26 MED ORDER — ALBUTEROL SULFATE HFA 108 (90 BASE) MCG/ACT IN AERS: 2.0000 | INHALATION_SPRAY | Freq: Four times a day (QID) | RESPIRATORY_TRACT | Status: DC | PRN

## 2014-12-26 NOTE — Telephone Encounter (Signed)
Received call from patient.  He went to pharmacy to pick up his ProAir and they gave him the [place] instead of the [place] and advised pharmacy that patient needs the HFA not Respiclick.  Patient does not like the Respiclick. Patient notified.  Nothing further needed.

## 2014-12-26 NOTE — Progress Notes (Signed)
Subjective:    Patient ID: Justin Ruiz, male    DOB: 03/06/1951, 64 y.o.   MRN: [id number]  HPI\ Pt here for follow up and management of chronic medical problems which consist of hypertension, hyperlipidemia, and diabetes. The patient is doing well as far as his lymphoma and his diabetes. His asthma/COPD he is also under good control and stable. He follows up regularly with his oncologist, Dr. Earlie Server and with his cardiologist, Dr. Percival Spanish. He sees the oncologist in December and the cardiologist next spring. He denies chest pain but does have shortness of breath with climbing stairs and going up pills. His swallowing is good he has no heartburn indigestion nausea vomiting or diarrhea. He does have some loose stools and he attributes this to taking the metformin. He is passing his water without problems and has no pain or burning and has seen no blood in the urine. He gets his pro times regularly. The last was 2 weeks ago.          Review of Systems  Constitutional: Negative.  Negative for fever and fatigue.  HENT: Negative.  Negative for congestion, sinus pressure and trouble swallowing.   Eyes: Negative.   Respiratory: Positive for shortness of breath (with climbing hills) and wheezing ( occasional wheezing but no more than usual).   Cardiovascular: Negative.  Negative for chest pain, palpitations and leg swelling.  Gastrointestinal: Negative.  Negative for abdominal pain, diarrhea, constipation and blood in stool.  Endocrine: Negative.   Genitourinary: Negative.  Negative for dysuria, urgency, frequency, penile swelling, scrotal swelling, penile pain and testicular pain.  Musculoskeletal: Negative.   Skin: Negative.   Allergic/Immunologic: Negative.   Neurological: Negative.   Hematological: Negative.   Psychiatric/Behavioral: Negative.        Objective:   Physical Exam  Constitutional: He is oriented to person, place, and time. He appears well-developed and  well-nourished. No distress.  He is pleasant and alert and in good spirits.  HENT:  Head: Normocephalic and atraumatic.  Right Ear: External ear normal.  Left Ear: External ear normal.  Nose: Nose normal.  Mouth/Throat: Oropharynx is clear and moist. No oropharyngeal exudate.  The patient wears dentures.  Eyes: Conjunctivae and EOM are normal. Pupils are equal, round, and reactive to light. Right eye exhibits no discharge. Left eye exhibits no discharge. No scleral icterus.  Neck: Normal range of motion. Neck supple. No thyromegaly present.  No carotid bruits or anterior cervical adenopathy or thyromegaly  Cardiovascular: Normal rate, normal heart sounds and intact distal pulses.   No murmur heard. The heart is irregular irregular at 72/m  Pulmonary/Chest: Effort normal. No respiratory distress. He has wheezes. He has no rales.  There is no axillary adenopathy. The breath sounds are somewhat diminished bilaterally. There are faint and sparse inspiratory and expiratory wheezes bilaterally without rales.  Abdominal: Soft. Bowel sounds are normal. He exhibits no mass. There is no tenderness. There is no rebound and no guarding.  The abdomen is obese without masses tenderness or bruits.  Genitourinary: Rectum normal and penis normal.  The patient has large buttocks and with the rectal exam I was unable to palpate the prostate gland. There were no rectal masses. There were no inguinal hernias palpable. There were no inguinal nodes. The external genitalia were normal.  Musculoskeletal: Normal range of motion. He exhibits no edema or tenderness.  Lymphadenopathy:    He has no cervical adenopathy.  Neurological: He is alert and oriented to person, place, and time.  He has normal reflexes. No cranial nerve deficit.  Skin: Skin is warm and dry. No rash noted.  Psychiatric: He has a normal mood and affect. His behavior is normal. Judgment and thought content normal.  Nursing note and vitals  reviewed.   BP 141/72 mmHg  Pulse 80  Temp(Src) 97.4 F (36.3 C) (Oral)  Ht _0  (1.753 m)  Wt 332 lb 9.6 oz (150.866 kg)  BMI 49.09 kg/m2         Assessment & Plan:  1. Type 2 diabetes mellitus with complication -The patient should continue to take his current medicine and get as much exercise as possible and monitor his blood sugars as closely as possible - POCT glycosylated hemoglobin (Hb A1C) - PSA - BMP8+EGFR  2. Hypertension -Continue to watch sodium intake and continue with current treatment - BMP8+EGFR - Hepatic function panel - NMR, lipoprofile  3. Hyperlipidemia -Continue with simvastatin and aggressive therapeutic lifestyle changes - BMP8+EGFR  4. Paroxysmal atrial fibrillation -The heart rate is good at about 72 but he remains in atrial fibrillation and sees the cardiologist once yearly in the spring he should continue to follow-up with the cardiologist - BMP8+EGFR  5. Diabetic neuropathy, type II diabetes mellitus -The patient's feet were good with good pulses and normal sensitivity today. - BMP8+EGFR  6. Diabetes mellitus type 2, insulin dependent -He should continue with current treatment including metformin despite some loose bowel movements from the metformin - BMP8+EGFR  7. Chronic respiratory failure with hypoxia -This is stable and patient is aware to use his inhalers religiously and to use respiratory protection when out in the elements. - BMP8+EGFR  8. NHL (non-Hodgkin's lymphoma) -Follow-up regularly with oncology - BMP8+EGFR  9. Obesity, morbid -He understands to continue to work aggressively with his diet and weight loss - BMP8+EGFR  10. Obstructive sleep apnea -Is not describing any daytime somnolence and he should continue with his CPAP. - BMP8+EGFR  No orders of the defined types were placed in this encounter.   The patient's and ask will be refilled today.  Patient Instructions  Continue current medications. Continue good  therapeutic lifestyle changes which include good diet and exercise. Fall precautions discussed with patient. If an FOBT was given today- please return it to our front desk. If you are over 16 years old - you may need Prevnar 41 or the adult Pneumonia vaccine.   After your visit with [place] today you will receive a survey in the mail or online from [employer] regarding your care with [place]. Please take a moment to fill this out. Your feedback is very important to [place] as you can help [place] better understand your patient needs as well as improve your experience and satisfaction. WE CARE ABOUT YOU!!!   The patient should continue with his regular follow-ups with hematology/oncology and cardiology He is up-to-date on his colonoscopies but there is a family history of colon cancer so he will need to have this repeated in 3-5 years He should always practice respiratory protection using mask and using his inhaler prior to exposure to allergens in his environment He should continue to monitor his blood sugars regularly and should he should return these readings when he comes to the next visit. He should watch his sodium intake closely to keep his blood pressure under good control and should make all possible efforts to exercise and watch his diet as closely as possible to get his weight down as much as possible   Arrie Senate  MD   

## 2014-12-26 NOTE — Patient Instructions (Addendum)
Continue current medications. Continue good therapeutic lifestyle changes which include good diet and exercise. Fall precautions discussed with patient. If an FOBT was given today- please return it to our front desk. If you are over 64 years old - you may need Prevnar 66 or the adult Pneumonia vaccine.   After your visit with [place] today you will receive a survey in the mail or online from [employer] regarding your care with [place]. Please take a moment to fill this out. Your feedback is very important to [place] as you can help [place] better understand your patient needs as well as improve your experience and satisfaction. WE CARE ABOUT YOU!!!   The patient should continue with his regular follow-ups with hematology/oncology and cardiology He is up-to-date on his colonoscopies but there is a family history of colon cancer so he will need to have this repeated in 3-5 years He should always practice respiratory protection using mask and using his inhaler prior to exposure to allergens in his environment He should continue to monitor his blood sugars regularly and should he should return these readings when he comes to the next visit. He should watch his sodium intake closely to keep his blood pressure under good control and should make all possible efforts to exercise and watch his diet as closely as possible to get his weight down as much as possible

## 2014-12-26 NOTE — Telephone Encounter (Signed)
atc pt, line rang, no answer and no voicemail.  Wcb.

## 2014-12-26 NOTE — Telephone Encounter (Signed)
Patient returned call, [phone].

## 2014-12-27 LAB — BMP8+EGFR
BUN/Creatinine Ratio: 21 (ref 10–22)
BUN: 21 mg/dL (ref 8–27)
CHLORIDE: 100 mmol/L (ref 97–108)
CO2: 27 mmol/L (ref 18–29)
Calcium: 9.5 mg/dL (ref 8.6–10.2)
Creatinine, Ser: 1.02 mg/dL (ref 0.76–1.27)
GFR calc Af Amer: 89 mL/min/{1.73_m2} (ref >59)
GFR calc non Af Amer: 77 mL/min/{1.73_m2} (ref >59)
GLUCOSE: 155 mg/dL — AB (ref 65–99)
POTASSIUM: 5 mmol/L (ref 3.5–5.2)
SODIUM: 142 mmol/L (ref 134–144)

## 2014-12-27 LAB — HEPATIC FUNCTION PANEL
ALBUMIN: 3.9 g/dL (ref 3.6–4.8)
ALT: 15 IU/L (ref 0–44)
AST: 20 IU/L (ref 0–40)
Alkaline Phosphatase: 65 IU/L (ref 39–117)
Bilirubin Total: 1.1 mg/dL (ref 0.0–1.2)
Bilirubin, Direct: 0.24 mg/dL (ref 0.00–0.40)
TOTAL PROTEIN: 6.7 g/dL (ref 6.0–8.5)

## 2014-12-27 LAB — NMR, LIPOPROFILE
CHOLESTEROL: 171 mg/dL (ref 100–199)
HDL Cholesterol by NMR: 45 mg/dL (ref >39)
HDL PARTICLE NUMBER: 31 umol/L (ref >=30.5)
LDL Particle Number: 1024 nmol/L — ABNORMAL HIGH (ref <1000)
LDL Size: 20.4 nm (ref >20.5)
LDL-C: 83 mg/dL (ref 0–99)
LP-IR Score: 64 — ABNORMAL HIGH (ref <=45)
Small LDL Particle Number: 540 nmol/L — ABNORMAL HIGH (ref <=527)
Triglycerides by NMR: 217 mg/dL — ABNORMAL HIGH (ref 0–149)

## 2014-12-27 LAB — PSA: Prostate Specific Ag, Serum: 0.8 ng/mL (ref 0.0–4.0)

## 2015-01-01 DIAGNOSIS — G4733 Obstructive sleep apnea (adult) (pediatric): Secondary | ICD-10-CM | POA: Diagnosis not present

## 2015-01-01 DIAGNOSIS — G4719 Other hypersomnia: Secondary | ICD-10-CM | POA: Diagnosis not present

## 2015-01-01 DIAGNOSIS — Z23 Encounter for immunization: Secondary | ICD-10-CM | POA: Diagnosis not present

## 2015-01-01 DIAGNOSIS — J441 Chronic obstructive pulmonary disease with (acute) exacerbation: Secondary | ICD-10-CM | POA: Diagnosis not present

## 2015-01-07 DIAGNOSIS — J449 Chronic obstructive pulmonary disease, unspecified: Secondary | ICD-10-CM | POA: Diagnosis not present

## 2015-01-07 DIAGNOSIS — J9611 Chronic respiratory failure with hypoxia: Secondary | ICD-10-CM | POA: Diagnosis not present

## 2015-01-10 DIAGNOSIS — J449 Chronic obstructive pulmonary disease, unspecified: Secondary | ICD-10-CM | POA: Diagnosis not present

## 2015-01-10 DIAGNOSIS — J439 Emphysema, unspecified: Secondary | ICD-10-CM | POA: Diagnosis not present

## 2015-01-21 ENCOUNTER — Encounter: Payer: Self-pay | Admitting: Internal Medicine

## 2015-01-21 ENCOUNTER — Ambulatory Visit (INDEPENDENT_AMBULATORY_CARE_PROVIDER_SITE_OTHER): Payer: Commercial Managed Care - HMO | Admitting: Internal Medicine

## 2015-01-21 VITALS — BP 108/68 | HR 66 | Ht 69.0 in | Wt 323.8 lb

## 2015-01-21 DIAGNOSIS — G4733 Obstructive sleep apnea (adult) (pediatric): Secondary | ICD-10-CM | POA: Diagnosis not present

## 2015-01-21 DIAGNOSIS — Z23 Encounter for immunization: Secondary | ICD-10-CM | POA: Diagnosis not present

## 2015-01-21 DIAGNOSIS — J9611 Chronic respiratory failure with hypoxia: Secondary | ICD-10-CM | POA: Diagnosis not present

## 2015-01-21 MED ORDER — PROMETHAZINE-CODEINE 6.25-10 MG/5ML PO SYRP: 5.0000 mL | ORAL_SOLUTION | Freq: Four times a day (QID) | ORAL | Status: DC | PRN

## 2015-01-21 NOTE — Assessment & Plan Note (Signed)
His obesity continues to represent significant health opportunity. Education and encouragement to take advantage of available support and bariatric resources.

## 2015-01-21 NOTE — Assessment & Plan Note (Signed)
Adequate compliance could be better as explained to him on reviewing the download. Pressure control is good. No changes required. Weight loss advised.

## 2015-01-21 NOTE — Assessment & Plan Note (Addendum)
He feels comfortable off oxygen more while awake but keeps it for exertion and sleep.  plan-flu shot

## 2015-01-21 NOTE — Patient Instructions (Addendum)
We can continue CPAP auto 12-20/ [employer]     Reminder that our goal is to use CPAP all night, every night, whenever you are sleeping.  Continue O2 2L w as dscussed  Flu vax

## 2015-01-21 NOTE — Progress Notes (Signed)
12/02/11- 62 yoM former smoker followed for COPD, OSA, complicated by hx lymphoma, AFib/ coumadin PFT 11/23/06- 0.47.   Hx 35 pk yr cigs.  LOV- 06/28/10  Patient states doing "pretty good". c/o sob with exertion. denies chest pain, chest tightness, wheezing, and cough. DOE especially on stairs- no change. Uses neb 1-2x/ yr. Seasonal pollens bother some.  OSA status not addressed this visit. COPD Assessment Test( CAT) 16/24  01/09/12-  61 yoM former smoker followed for COPD, OSA, complicated by hx lymphoma, AFib/ coumadin ACUTE VISIT: unable to lay flat-not able to breathe; Increased SOB and wheezing-getting worse; breathing tx at 4am and used rescue inhaler as well; has been going on since yesterday. Wife here On arrival today o2 sat 80, responded to 3 L O2. COPD assessment test (CAT) 40/40. Did well at [facility] on vacation without recognized fluid retention or other acute event. Home over the past week- Gradually worse shortness of breath x2 days with increased cough, watery rhinorrhea. Rattling chest congestion x2 days. Denies fever, pain, green, blood, sore throat. Wife gave antihistamine yesterday. CT chest 11/01/11-reviewed with them IMPRESSION:  1. New airspace opacity in the left lower lobe - pneumonia or  pulmonary hemorrhage could give a similar appearance. Follow-up  chest radiography to ensure clearance is recommended.  2. Several small right lung nodules are stable and nonspecific due  to small size.  3. Mediastinal lymph nodes are slightly increased in size compared  the prior exam, but currently within normal size limits.  4. Atherosclerosis.   01/22/13- 61 yoM former smoker followed for COPD, OSA, complicated by hx lymphoma, AFib/ coumadin pt reports breathing is doing well-- denies any other concerns at this time Schedule for vaccine next week. Denies cough wheeze or active concern. Seasonal rhinitis with watery nose. Rescue inhaler sometimes  once a day, currently 2 or 3 times per  week. Denies any concerns about potential cough relation enalapril-discussed CXR 09/03/12 IMPRESSION:  No acute or metastatic cardiopulmonary abnormality.  rec Refills for rescue inhaler and nebulizer  06/26/2013 pulmonary consultation /[place] re: ? Recurrent lymphoma vs ca Chief Complaint  Patient presents with  . Follow-up    Pt here at the request of Dr. Beryle Beams for eval of PET and possible Bronch. Pt reports having increased DOE for the past month.  doe indolent onset x steps, flat ok - no cough on ACEi  Has duoneb/ saba hfa but hasn't tried them before desired activity   12/09/13-  61 yoM former smoker followed for COPD, OSA, complicated by hx lymphoma, DM , AFib/ coumadin, morbid obesity FOLLOWS FOR: Increased SOB over the past month; wheezing at times, sats dropping in 80's(has noticed this more at night time) He no longer has home oxygen. O2 sat here dropped to 86% at rest standing on room air today on arrival. He had been needing to use nebulizer more regularly for chest tightness before leaving for beach, and has not improved. Denies acute infection, chest pain, andkle edema or bleeding. Using home nebulizer with DuoNeb. Lymphoma being followed by oncology with CT scan. Nodules are reported smaller. CT chest 09/06/13 IMPRESSION:  1. Mediastinal and right hilar lymphadenopathy identified on the  recent PET-CT has decreased in size in the interval since  06/19/2013.  2. Scattered bilateral small pulmonary nodules. 1 of the nodules in  the right middle lobe measures minimally larger on today's study but  the remaining nodules are all stable. No new pulmonary nodule is  evident.  3. Stable small hepatoduodenal  ligament lymph nodes. No change in  the retroperitoneal aortocaval lymph node measured previously  comparing back to the study from 03/04/2013. No new or progressive  nodal disease in the abdomen or pelvis.  Electronically Signed  By: [name] Stanley M.D.  On: 09/06/2013  09:35  *Not clear why he has never had sleep study- first priority is to address O2 needs, but will need to address OSA next visit* Also needs update PFT  01/24/2014 Follow up  Returns for 6 week follow up for COPD flare  Tx w/ Depo Medrol shot last ov and started on BREO . Does not want to continue BREO says he does not feel he needs it.  Has duoneb at home but does not use it.  No recent PFt  Started on O2 last ov for persistent desats.  Pt has CT chest next month for follow up for known lung nodules.  Says he is feeling much better , stopped his Oxygen . We  Discussed need to wear at all times. O2 sats 83% on RA on arrival to office , at rest 90% on RA . Of note on ACEI, denies cough.  Has daytime sleepiness, snoring , never had a sleep study in past.   05/22/14-63 yoM former smoker followed for COPD/ chronic hypoxic resp failure, OSA, complicated by hx lymphoma, DM , AFib/ coumadin, morbid obesity FOLLOWS FOR: Review sleep study with patient. NPSG-  04/07/2014-severe obstructive sleep apnea-AHI 75.1 per hour. CPAP titrated to 16. He required supplemental oxygen at 2 L/ [employer] which he already wears for sleep at home. Weight 338 pounds Dr. Earlie Server continues following for oncology/lymphoma CT chest 03/17/14-images reviewed IMPRESSION:  CT CHEST IMPRESSION  1. Similar to slight improvement in subpleural pulmonary nodules,  favored to represent benign subpleural lymph nodes. The right middle  lobe nodule described on the prior exam has resolved.  2. No acute process or evidence of active lymphoma within the chest.  3. Atherosclerosis, including within the coronary arteries.  CT ABDOMEN AND PELVIS IMPRESSION  1. Similar retrocaval adenopathy.  2. No evidence of new or progressive adenopathy within the abdomen  or pelvis.  3. Cholelithiasis.  Electronically Signed  By: Abigail Miyamoto M.D.  On: 03/17/2014 11:14  07/21/14- 51 yoM former smoker followed for COPD/ chronic hypoxic resp  failure, OSA, complicated by hx lymphoma, DM , AFib/ coumadin, morbid obesity FOLLOWS FOR states wearing cpap 5-7 hours a night. Denies any problems with pressure or equipment  CPAP auto 12-20/ [employer]  01/21/15-64 yoM former smoker followed for COPD/ chronic hypoxic resp failure, OSA, complicated by hx lymphoma, DM , AFib/ coumadin, morbid obesity CPAP auto 12-20/ [employer]   O2 2L continuous FOLLOWS UP: pt. states he wears mask every night 4-6 hrs feels it helps him. DME IS [employer] CPAP "is my best buddy". We reviewed download showing room for improvement in usage hours and he will work on that. Control is good. He is satisfied to continue present pressure range. Prefers Symbicort over powder inhalers. Occasional shortness of breath with little wheeze, controlled with current meds. Using rescue inhaler once or twice daily especially if outdoors with weather change or ragweed. Denies wheeze causing sleep disturbance.  ROS-see HPI Constitutional:   No-   weight loss, night sweats, fevers, chills, +fatigue, lassitude. HEENT:   No-  headaches, difficulty swallowing, tooth/dental problems, sore throat,       No-  sneezing, itching, ear ache, nasal congestion, post nasal drip,  CV:  No-   chest pain, orthopnea,  PND, swelling in lower extremities, anasarca,                                                             dizziness, palpitations Resp: + shortness of breath with exertion or at rest.              No-   productive cough,  No non-productive cough,  No- coughing up of blood.              No-   change in color of mucus.   Skin: No-   rash or lesions. GI:  No-   heartburn, indigestion, abdominal pain, nausea, vomiting,  GU:  MS:  No-   joint pain or swelling.  . Neuro-     nothing unusual Psych:  No- change in mood or affect. No depression or anxiety.  No memory loss.  OBJ- Physical Exam General- Alert, Oriented, Affect-appropriate, Distress- none acute, +morbidly obese Skin- rash-none, lesions-  none, excoriation- none Lymphadenopathy- none Head- atraumatic            Eyes- Gross vision intact, PERRLA, conjunctivae and secretions clear, + dark circles            Ears- Hearing, canals-normal            Nose- Clear, no-Septal dev, mucus, polyps, erosion, perforation             Throat- Mallampati II , mucosa clear , drainage- none, tonsils- atrophic Neck- flexible , trachea midline, no stridor , thyroid nl, carotid no bruit Chest - symmetrical excursion , unlabored           Heart/CV- IIR , no murmur , no gallop  , no rub, nl s1 s2                           - JVD- none , edema- none, stasis changes- none, varices- none           Lung- decreased airflow, unlabored, wheeze-none none , dullness-none, rub- none           Chest wall-  Abd-  Br/ Gen/ Rectal- Not done, not indicated Extrem- cyanosis- none, clubbing, none, atrophy- none, strength- nl Neuro- grossly intact to observation

## 2015-01-29 ENCOUNTER — Other Ambulatory Visit: Payer: Self-pay | Admitting: Family Medicine

## 2015-01-31 DIAGNOSIS — J441 Chronic obstructive pulmonary disease with (acute) exacerbation: Secondary | ICD-10-CM | POA: Diagnosis not present

## 2015-01-31 DIAGNOSIS — Z23 Encounter for immunization: Secondary | ICD-10-CM | POA: Diagnosis not present

## 2015-01-31 DIAGNOSIS — G4719 Other hypersomnia: Secondary | ICD-10-CM | POA: Diagnosis not present

## 2015-01-31 DIAGNOSIS — G4733 Obstructive sleep apnea (adult) (pediatric): Secondary | ICD-10-CM | POA: Diagnosis not present

## 2015-02-03 DIAGNOSIS — E669 Obesity, unspecified: Secondary | ICD-10-CM | POA: Diagnosis not present

## 2015-02-03 DIAGNOSIS — I10 Essential (primary) hypertension: Secondary | ICD-10-CM | POA: Diagnosis not present

## 2015-02-03 DIAGNOSIS — R809 Proteinuria, unspecified: Secondary | ICD-10-CM | POA: Diagnosis not present

## 2015-02-03 DIAGNOSIS — N182 Chronic kidney disease, stage 2 (mild): Secondary | ICD-10-CM | POA: Diagnosis not present

## 2015-02-04 ENCOUNTER — Ambulatory Visit (INDEPENDENT_AMBULATORY_CARE_PROVIDER_SITE_OTHER): Payer: Commercial Managed Care - HMO | Admitting: Pharmacist

## 2015-02-04 ENCOUNTER — Telehealth: Payer: Self-pay | Admitting: Internal Medicine

## 2015-02-04 DIAGNOSIS — Z7901 Long term (current) use of anticoagulants: Secondary | ICD-10-CM

## 2015-02-04 DIAGNOSIS — I482 Chronic atrial fibrillation, unspecified: Secondary | ICD-10-CM

## 2015-02-04 DIAGNOSIS — I4891 Unspecified atrial fibrillation: Secondary | ICD-10-CM

## 2015-02-04 LAB — POCT INR: INR: 2.3

## 2015-02-04 NOTE — Patient Instructions (Signed)
Anticoagulation Dose Instructions as of 02/04/2015      Dorene Grebe Tue Wed Thu Fri Sat   New Dose 5 mg 7.5 mg 5 mg 5 mg 5 mg 7.5 mg 5 mg    Description        Continue warfarin 7.5mg  mondays and fridays and 5mg  all other days.     INR was 2.3 today

## 2015-02-04 NOTE — Telephone Encounter (Signed)
Last seen on 01/21/15 with CY And was printed at [facility] and spoke with [facility] in [place], they verified that refill for promethazine-codeine was never received or filled. Called pt to verify correct pharmacy and to verify that promethazine-codeine was the correct med.   Called in rx to [facility] in [place] [phone] with no refills, Take 80mls every 6 hours as needed  Okayed per Ashley. Pt has been notified  Nothing further needed Will sign off on message

## 2015-02-06 DIAGNOSIS — J449 Chronic obstructive pulmonary disease, unspecified: Secondary | ICD-10-CM | POA: Diagnosis not present

## 2015-02-06 DIAGNOSIS — J9611 Chronic respiratory failure with hypoxia: Secondary | ICD-10-CM | POA: Diagnosis not present

## 2015-02-09 DIAGNOSIS — J449 Chronic obstructive pulmonary disease, unspecified: Secondary | ICD-10-CM | POA: Diagnosis not present

## 2015-02-09 DIAGNOSIS — J439 Emphysema, unspecified: Secondary | ICD-10-CM | POA: Diagnosis not present

## 2015-02-20 ENCOUNTER — Other Ambulatory Visit (HOSPITAL_COMMUNITY): Payer: Self-pay | Admitting: Nephrology

## 2015-02-20 DIAGNOSIS — I129 Hypertensive chronic kidney disease with stage 1 through stage 4 chronic kidney disease, or unspecified chronic kidney disease: Secondary | ICD-10-CM

## 2015-02-20 DIAGNOSIS — N183 Chronic kidney disease, stage 3 (moderate): Principal | ICD-10-CM

## 2015-03-03 DIAGNOSIS — G4733 Obstructive sleep apnea (adult) (pediatric): Secondary | ICD-10-CM | POA: Diagnosis not present

## 2015-03-03 DIAGNOSIS — J441 Chronic obstructive pulmonary disease with (acute) exacerbation: Secondary | ICD-10-CM | POA: Diagnosis not present

## 2015-03-03 DIAGNOSIS — Z23 Encounter for immunization: Secondary | ICD-10-CM | POA: Diagnosis not present

## 2015-03-03 DIAGNOSIS — G4719 Other hypersomnia: Secondary | ICD-10-CM | POA: Diagnosis not present

## 2015-03-04 ENCOUNTER — Ambulatory Visit (HOSPITAL_COMMUNITY)
Admission: RE | Admit: 2015-03-04 | Discharge: 2015-03-04 | Disposition: A | Discharge status: Home / Self Care | Payer: Commercial Managed Care - HMO | Source: Ambulatory Visit | Attending: Nephrology | Admitting: Nephrology

## 2015-03-04 DIAGNOSIS — I10 Essential (primary) hypertension: Secondary | ICD-10-CM | POA: Diagnosis not present

## 2015-03-04 DIAGNOSIS — N183 Chronic kidney disease, stage 3 (moderate): Secondary | ICD-10-CM | POA: Diagnosis not present

## 2015-03-04 DIAGNOSIS — N189 Chronic kidney disease, unspecified: Secondary | ICD-10-CM | POA: Diagnosis not present

## 2015-03-04 DIAGNOSIS — D649 Anemia, unspecified: Secondary | ICD-10-CM | POA: Diagnosis not present

## 2015-03-04 DIAGNOSIS — E559 Vitamin D deficiency, unspecified: Secondary | ICD-10-CM | POA: Diagnosis not present

## 2015-03-04 DIAGNOSIS — I129 Hypertensive chronic kidney disease with stage 1 through stage 4 chronic kidney disease, or unspecified chronic kidney disease: Secondary | ICD-10-CM | POA: Insufficient documentation

## 2015-03-04 DIAGNOSIS — Z79899 Other long term (current) drug therapy: Secondary | ICD-10-CM | POA: Diagnosis not present

## 2015-03-04 DIAGNOSIS — R809 Proteinuria, unspecified: Secondary | ICD-10-CM | POA: Diagnosis not present

## 2015-03-04 DIAGNOSIS — D509 Iron deficiency anemia, unspecified: Secondary | ICD-10-CM | POA: Diagnosis not present

## 2015-03-06 DIAGNOSIS — G4733 Obstructive sleep apnea (adult) (pediatric): Secondary | ICD-10-CM | POA: Diagnosis not present

## 2015-03-09 DIAGNOSIS — J449 Chronic obstructive pulmonary disease, unspecified: Secondary | ICD-10-CM | POA: Diagnosis not present

## 2015-03-09 DIAGNOSIS — J9611 Chronic respiratory failure with hypoxia: Secondary | ICD-10-CM | POA: Diagnosis not present

## 2015-03-12 DIAGNOSIS — J439 Emphysema, unspecified: Secondary | ICD-10-CM | POA: Diagnosis not present

## 2015-03-12 DIAGNOSIS — J449 Chronic obstructive pulmonary disease, unspecified: Secondary | ICD-10-CM | POA: Diagnosis not present

## 2015-03-16 ENCOUNTER — Ambulatory Visit (INDEPENDENT_AMBULATORY_CARE_PROVIDER_SITE_OTHER): Payer: Commercial Managed Care - HMO | Admitting: Pharmacist

## 2015-03-16 DIAGNOSIS — Z794 Long term (current) use of insulin: Secondary | ICD-10-CM

## 2015-03-16 DIAGNOSIS — I482 Chronic atrial fibrillation, unspecified: Secondary | ICD-10-CM

## 2015-03-16 DIAGNOSIS — E1165 Type 2 diabetes mellitus with hyperglycemia: Secondary | ICD-10-CM | POA: Diagnosis not present

## 2015-03-16 DIAGNOSIS — IMO0002 Reserved for concepts with insufficient information to code with codable children: Secondary | ICD-10-CM

## 2015-03-16 DIAGNOSIS — Z7901 Long term (current) use of anticoagulants: Secondary | ICD-10-CM | POA: Diagnosis not present

## 2015-03-16 DIAGNOSIS — I4891 Unspecified atrial fibrillation: Secondary | ICD-10-CM

## 2015-03-16 DIAGNOSIS — E1140 Type 2 diabetes mellitus with diabetic neuropathy, unspecified: Secondary | ICD-10-CM

## 2015-03-16 LAB — POCT INR: INR: 3.2

## 2015-03-16 MED ORDER — INSULIN NPH (HUMAN) (ISOPHANE) 100 UNIT/ML ~~LOC~~ SUSP: 35.0000 [IU] | Freq: Two times a day (BID) | SUBCUTANEOUS | Status: DC

## 2015-03-16 NOTE — Patient Instructions (Addendum)
Anticoagulation Dose Instructions as of 03/16/2015      Dorene Grebe Tue Wed Thu Fri Sat   New Dose 5 mg 7.5 mg 5 mg 5 mg 5 mg 7.5 mg 5 mg    Description        No warfarin today - Monday, November 21st.  Then continue warfarin 7.5mg  mondays and fridays and 5mg  all other days.     INR was 3.2 today  Slowly increase Novolin N from 32 units twice a day to 35 units twice a day over the next week.  Call if you continue to have glucose reading over 200 or if you have a low less than 70.

## 2015-03-16 NOTE — Progress Notes (Signed)
Subjective:     Indication: atrial fibrillation Bleeding signs/symptoms: None Thromboembolic signs/symptoms: None  Missed Coumadin doses: None Medication changes: no Dietary changes: no Bacterial/viral infection: yes - patient has a cold - not taking any medication for this at this time Other concerns: yes - asked about home BG readings - patient states that he get readings over 200 about 50% of the time.   The following portions of the patient's history were reviewed and updated as appropriate: allergies, current medications, past family history, past medical history, past social history, past surgical history and problem list.   Objective:    INR Today: 3.2 Current dose: 7.5mg  mondays and fridays;  5mg  all other days     Assessment:    Supratherapeutic INR for goal of 2-3  Uncontrolled type 2 DM  Plan:    1. New dose: hold warfarin today, then restart usual dose of warfarin 7.5mg  mondays and fridays and 5mg  all other days   2. Next INR: 1 month   3.  Increase Novolin N from 32 units BID to 35 units BID over the next week (to incrase by 1 units BID every 2-3 days)  Cherre Robins, PharmD, CPP, CDE

## 2015-03-23 ENCOUNTER — Ambulatory Visit (INDEPENDENT_AMBULATORY_CARE_PROVIDER_SITE_OTHER): Payer: Commercial Managed Care - HMO | Admitting: Family Medicine

## 2015-03-23 ENCOUNTER — Encounter: Payer: Self-pay | Admitting: Family Medicine

## 2015-03-23 VITALS — BP 111/72 | HR 76 | Temp 97.5°F | Ht 69.0 in | Wt 328.0 lb

## 2015-03-23 DIAGNOSIS — R7309 Other abnormal glucose: Secondary | ICD-10-CM

## 2015-03-23 DIAGNOSIS — R739 Hyperglycemia, unspecified: Secondary | ICD-10-CM

## 2015-03-23 NOTE — Progress Notes (Signed)
Subjective:    Patient ID: Justin Ruiz, male    DOB: March 14, 1951, 64 y.o.   MRN: [id number]  HPI Patient here today for elevated Blood sugar. He just seen clinical pharmacist 1 week ago for the same reason. The patient has been averaging a lot higher blood sugars when before they were below 200 and now they're running on a average about 270. He is currently taking Humulin N and 35 units twice a day and not feeling are 32 units twice a day. He has not changed the batteries on his machine and a good while. He is not due for repeat hemoglobin A1c until December 2. He indicates that the sugar started running higher when he was placed on Lasix by the urologist. The patient is not having any chest pain shortness of breath or symptoms of hyperglycemia. His GI system is working well and he has a follow-up appointment with the urologist soon.       Patient Active Problem List   Diagnosis Date Noted  . Diabetic neuropathy, type II diabetes mellitus ([place]) 09/25/2014  . Chronic respiratory failure with hypoxia ([place]) 05/25/2014  . Obstructive sleep apnea 05/25/2014  . Benign neoplasm of ascending colon 05/05/2014  . Excessive daytime sleepiness 01/24/2014  . Mediastinal adenopathy 06/20/2013  . Severe obesity (BMI >= 40) ([place]) 05/01/2013  . Generalized anxiety disorder 05/01/2013  . Lung nodules 03/11/2013  . Chronic anticoagulation 08/20/2012  . FIBRILLATION, ATRIAL 05/12/2010  . NHL (non-Hodgkin's lymphoma) ([place]) 01/26/2010  . Obesity, morbid ([place]) 04/27/2009  . CARDIOVASCULAR STUDIES, ABNORMAL 04/27/2009  . ABNORMAL STRESS ELECTROCARDIOGRAM 03/25/2009  . PERSONAL HISTORY OF COLONIC POLYPS 03/02/2009  . Diabetes mellitus type 2, insulin dependent ([place]) 04/29/2007  . Hyperlipidemia 04/29/2007  . Hypertension 04/29/2007  . Seasonal and perennial allergic rhinitis 04/29/2007  . COPD exacerbation ([place]) 04/29/2007  . G E R D 04/29/2007  . BENIGN PROSTATIC HYPERTROPHY, HX OF 04/29/2007    Outpatient Encounter Prescriptions as of 03/23/2015  Medication Sig  . acetaminophen (TYLENOL) 500 MG tablet Take 1,000 mg by mouth every 6 (six) hours as needed for headache.  . albuterol (PROAIR HFA) 108 (90 BASE) MCG/ACT inhaler Inhale 2 puffs into the lungs every 6 (six) hours as needed for wheezing or shortness of breath.  . ALPRAZolam (XANAX) 0.25 MG tablet Take 1 tablet (0.25 mg total) by mouth at bedtime as needed.  . Blood Glucose Calibration (TAI DOC CONTROL) NORMAL SOLN Use as directed.  . Blood Glucose Monitoring Suppl (CLEVER CHEK AUTO-CODE VOICE) DEVI USE AS DIRECTED  . budesonide-formoterol (SYMBICORT) 160-4.5 MCG/ACT inhaler Inhale 2 puffs into the lungs 2 (two) times daily.  . Cholecalciferol (VITAMIN D3) 5000 UNITS TABS Take 1 tablet by mouth every morning.  . enalapril (VASOTEC) 20 MG tablet Take 1 tablet (20 mg total) by mouth daily.  . furosemide (LASIX) 20 MG tablet Take 20 mg by mouth daily.  . insulin NPH Human (NOVOLIN N RELION) 100 UNIT/ML injection Inject 0.35 mLs (35 Units total) into the skin 2 (two) times daily before a meal.  . ipratropium-albuterol (DUONEB) 0.5-2.5 (3) MG/3ML SOLN PLACE 1 VIAL (3 MLS) IN THE NEBULIZER EVERY 6 HOURS AS NEEDED  . Lancet Devices (LANCING DEVICE) MISC USE AS DIRECTED  . metFORMIN (GLUCOPHAGE) 500 MG tablet TAKE ONE TABLET BY MOUTH IN THE MORNING AND TWO IN THE EVENING  . metoprolol (LOPRESSOR) 100 MG tablet Take 1 tablet (100 mg total) by mouth 2 (two) times daily.  Marland Kitchen NOVOLIN R RELION 100  UNIT/ML injection INJECT 32 UNITS SUBCUTANEOUSLY TWICE DAILY BEFORE  A  MEAL  . promethazine-codeine (PHENERGAN WITH CODEINE) 6.25-10 MG/5ML syrup Take 5 mLs by mouth every 6 (six) hours as needed for cough.  . simvastatin (ZOCOR) 40 MG tablet Take 1 tablet (40 mg total) by mouth daily.  Marland Kitchen warfarin (COUMADIN) 5 MG tablet TAKE ONE & ONE-HALF TABS BY PO QD ON  MONDAY AND  FRIDAY & THEN TAKE 1 TAB THE REST OF THE WEEK   No facility-administered  encounter medications on file as of 03/23/2015.     Review of Systems  Constitutional: Negative.   HENT: Negative.   Eyes: Negative.   Respiratory: Negative.   Cardiovascular: Negative.   Gastrointestinal: Negative.   Endocrine: Negative.        Elevated BS  Genitourinary: Negative.   Musculoskeletal: Negative.   Skin: Negative.   Allergic/Immunologic: Negative.   Neurological: Negative.   Hematological: Negative.   Psychiatric/Behavioral: Negative.        Objective:   Physical Exam  BP 111/72 mmHg  Pulse 76  Temp(Src) 97.5 F (36.4 C) (Oral)  Ht '5\' 9"'  (1.753 m)  Wt 328 lb (148.78 kg)  BMI 48.42 kg/m2 About 15 minutes of time was spent discussing with the patient when he is currently doing with his insulin and blood sugars that correlate with his current insulin. This is when he indicated he had not change the batteries in a good while. Before we make any medication changes it is agreed upon that he will monitor the blood sugars after changing the batteries and if they keep running high he will increase his insulin accordingly and keep a follow-up appointment with the clinical pharmacists, the certified diabetic educator.      Assessment & Plan:  1. Elevated blood sugar -Monitor blood sugars more frequently, change batteries, compare readings after battery changed to additional monitor given to the patient today -Keep return appointment with clinical pharmacy and bring blood sugar readings in to that visit - POCT glucose (manual entry) - BMP8+EGFR - CBC with Differential/Platelet  Patient Instructions  Check blood sugars more frequently Get new batteries for machine Double check that with a monitor that we give you today If the blood sugars keep running in the high range as they have been doing for the past few weeks increase the hemoglobin in by 1 unit every 2-3 days twice daily until the blood sugars get back down to below the 200 range Keep follow-up appointment  with clinical pharmacists next week and bring blood sugars in for review at that time Continue to exercise and follow-up diet closely   Arrie Senate MD

## 2015-03-23 NOTE — Patient Instructions (Signed)
Check blood sugars more frequently Get new batteries for machine Double check that with a monitor that we give you today If the blood sugars keep running in the high range as they have been doing for the past few weeks increase the hemoglobin in by 1 unit every 2-3 days twice daily until the blood sugars get back down to below the 200 range Keep follow-up appointment with clinical pharmacists next week and bring blood sugars in for review at that time Continue to exercise and follow-up diet closely

## 2015-03-25 DIAGNOSIS — E669 Obesity, unspecified: Secondary | ICD-10-CM | POA: Diagnosis not present

## 2015-03-25 DIAGNOSIS — R809 Proteinuria, unspecified: Secondary | ICD-10-CM | POA: Diagnosis not present

## 2015-03-25 DIAGNOSIS — N182 Chronic kidney disease, stage 2 (mild): Secondary | ICD-10-CM | POA: Diagnosis not present

## 2015-03-27 ENCOUNTER — Ambulatory Visit: Payer: Commercial Managed Care - HMO | Admitting: Pharmacist

## 2015-04-02 DIAGNOSIS — G4719 Other hypersomnia: Secondary | ICD-10-CM | POA: Diagnosis not present

## 2015-04-02 DIAGNOSIS — G4733 Obstructive sleep apnea (adult) (pediatric): Secondary | ICD-10-CM | POA: Diagnosis not present

## 2015-04-02 DIAGNOSIS — J441 Chronic obstructive pulmonary disease with (acute) exacerbation: Secondary | ICD-10-CM | POA: Diagnosis not present

## 2015-04-02 DIAGNOSIS — Z23 Encounter for immunization: Secondary | ICD-10-CM | POA: Diagnosis not present

## 2015-04-08 ENCOUNTER — Other Ambulatory Visit: Payer: Commercial Managed Care - HMO

## 2015-04-08 DIAGNOSIS — J449 Chronic obstructive pulmonary disease, unspecified: Secondary | ICD-10-CM | POA: Diagnosis not present

## 2015-04-08 DIAGNOSIS — J9611 Chronic respiratory failure with hypoxia: Secondary | ICD-10-CM | POA: Diagnosis not present

## 2015-04-09 ENCOUNTER — Other Ambulatory Visit: Payer: Self-pay | Admitting: *Deleted

## 2015-04-09 ENCOUNTER — Ambulatory Visit (HOSPITAL_COMMUNITY)
Admission: RE | Admit: 2015-04-09 | Discharge: 2015-04-09 | Disposition: A | Discharge status: Home / Self Care | Payer: Commercial Managed Care - HMO | Source: Ambulatory Visit | Attending: Internal Medicine | Admitting: Internal Medicine

## 2015-04-09 ENCOUNTER — Other Ambulatory Visit (HOSPITAL_BASED_OUTPATIENT_CLINIC_OR_DEPARTMENT_OTHER): Payer: Commercial Managed Care - HMO

## 2015-04-09 DIAGNOSIS — C8280 Other types of follicular lymphoma, unspecified site: Secondary | ICD-10-CM

## 2015-04-09 DIAGNOSIS — C8590 Non-Hodgkin lymphoma, unspecified, unspecified site: Secondary | ICD-10-CM

## 2015-04-09 DIAGNOSIS — I700 Atherosclerosis of aorta: Secondary | ICD-10-CM | POA: Diagnosis not present

## 2015-04-09 DIAGNOSIS — R918 Other nonspecific abnormal finding of lung field: Secondary | ICD-10-CM | POA: Diagnosis not present

## 2015-04-09 DIAGNOSIS — I2510 Atherosclerotic heart disease of native coronary artery without angina pectoris: Secondary | ICD-10-CM | POA: Insufficient documentation

## 2015-04-09 DIAGNOSIS — Z8572 Personal history of non-Hodgkin lymphomas: Secondary | ICD-10-CM

## 2015-04-09 DIAGNOSIS — M899 Disorder of bone, unspecified: Secondary | ICD-10-CM | POA: Insufficient documentation

## 2015-04-09 LAB — COMPREHENSIVE METABOLIC PANEL
ALT: 15 U/L (ref 0–55)
AST: 17 U/L (ref 5–34)
Albumin: 3.5 g/dL (ref 3.5–5.0)
Alkaline Phosphatase: 59 U/L (ref 40–150)
Anion Gap: 9 mEq/L (ref 3–11)
BUN: 24.9 mg/dL (ref 7.0–26.0)
CHLORIDE: 102 meq/L (ref 98–109)
CO2: 29 meq/L (ref 22–29)
CREATININE: 1 mg/dL (ref 0.7–1.3)
Calcium: 9.6 mg/dL (ref 8.4–10.4)
EGFR: 75 mL/min/{1.73_m2} — ABNORMAL LOW (ref >90)
Glucose: 195 mg/dl — ABNORMAL HIGH (ref 70–140)
Potassium: 4.4 mEq/L (ref 3.5–5.1)
SODIUM: 140 meq/L (ref 136–145)
Total Bilirubin: 1.38 mg/dL — ABNORMAL HIGH (ref 0.20–1.20)
Total Protein: 7.1 g/dL (ref 6.4–8.3)

## 2015-04-09 LAB — CBC WITH DIFFERENTIAL/PLATELET
BASO%: 2.7 % — ABNORMAL HIGH (ref 0.0–2.0)
BASOS ABS: 0.2 10*3/uL — AB (ref 0.0–0.1)
EOS ABS: 0.1 10*3/uL (ref 0.0–0.5)
EOS%: 1.3 % (ref 0.0–7.0)
HCT: 45.6 % (ref 38.4–49.9)
HGB: 14.7 g/dL (ref 13.0–17.1)
LYMPH%: 25.5 % (ref 14.0–49.0)
MCH: 30.1 pg (ref 27.2–33.4)
MCHC: 32.2 g/dL (ref 32.0–36.0)
MCV: 93.4 fL (ref 79.3–98.0)
MONO#: 0.4 10*3/uL (ref 0.1–0.9)
MONO%: 6.6 % (ref 0.0–14.0)
NEUT#: 4.1 10*3/uL (ref 1.5–6.5)
NEUT%: 63.9 % (ref 39.0–75.0)
PLATELETS: 131 10*3/uL — AB (ref 140–400)
RBC: 4.89 10*6/uL (ref 4.20–5.82)
RDW: 13.9 % (ref 11.0–14.6)
WBC: 6.5 10*3/uL (ref 4.0–10.3)
lymph#: 1.7 10*3/uL (ref 0.9–3.3)

## 2015-04-09 LAB — LACTATE DEHYDROGENASE: LDH: 202 U/L (ref 125–245)

## 2015-04-09 MED ORDER — IOHEXOL 300 MG/ML  SOLN
100.0000 mL | Freq: Once | INTRAMUSCULAR | Status: AC | PRN
Start: 2015-04-09 — End: 2015-04-09
  Administered 2015-04-09: 100 mL via INTRAVENOUS

## 2015-04-11 DIAGNOSIS — J439 Emphysema, unspecified: Secondary | ICD-10-CM | POA: Diagnosis not present

## 2015-04-11 DIAGNOSIS — J449 Chronic obstructive pulmonary disease, unspecified: Secondary | ICD-10-CM | POA: Diagnosis not present

## 2015-04-15 ENCOUNTER — Ambulatory Visit (HOSPITAL_BASED_OUTPATIENT_CLINIC_OR_DEPARTMENT_OTHER): Payer: Commercial Managed Care - HMO | Admitting: Internal Medicine

## 2015-04-15 ENCOUNTER — Encounter: Payer: Self-pay | Admitting: Internal Medicine

## 2015-04-15 ENCOUNTER — Telehealth: Payer: Self-pay | Admitting: Internal Medicine

## 2015-04-15 VITALS — BP 143/95 | HR 70 | Temp 99.2°F | Resp 19 | Ht 69.0 in | Wt 332.6 lb

## 2015-04-15 DIAGNOSIS — R590 Localized enlarged lymph nodes: Secondary | ICD-10-CM

## 2015-04-15 DIAGNOSIS — D472 Monoclonal gammopathy: Secondary | ICD-10-CM

## 2015-04-15 DIAGNOSIS — Z8572 Personal history of non-Hodgkin lymphomas: Secondary | ICD-10-CM | POA: Diagnosis not present

## 2015-04-15 DIAGNOSIS — C8228 Follicular lymphoma grade III, unspecified, lymph nodes of multiple sites: Secondary | ICD-10-CM

## 2015-04-15 NOTE — Telephone Encounter (Signed)
Gave pt appt for 10/08/15.

## 2015-04-15 NOTE — Progress Notes (Signed)
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 2) hypermetabolic mediastinal lymphadenopathy. 3) monoclonal gammopathy of undetermined significance  PRIOR THERAPY: Status post systemic chemotherapy with 6 cycles of infusional CHOPE with Rituxan.  CURRENT THERAPY: Observation  INTERVAL HISTORY: Justin Ruiz 64 y.o. male returns to the clinic today for followup visit accompanied by his wife. The patient is feeling well today with no specific complaints except for mild fatigue as well as shortness of breath secondary to his overweight and COPD. He denied having any significant weight loss or night sweats. He has no palpable lymphadenopathy. He has no bleeding issues. He has no chest pain but continues to have shortness breath with exertion with no cough or hemoptysis. He had repeat CBC, comprehensive metabolic panel and LDH and addition to CT scan of the chest, abdomen and pelvis performed recently and he is here for evaluation and discussion of his scan results. He was seen by his primary care physician in November 2016 and urine protein electrophoreses showed no detectable M spike but serum protein electrophoresis showed M spike representing 0.2G/DL with immunofixation showing IgM monoclonal protein with A light chain specificity. Free kappa light chain at that time was 35.68, free lambda light chain was 21.16 with a kappa/lambda ratio of 1.69.  MEDICAL HISTORY: Past Medical History  Diagnosis Date  . Diabetes mellitus   . Asthma   . Hyperlipidemia   . Obesity     exogenous  . BPH (benign prostatic hypertrophy)   . Colon polyps   . Shingles   . GERD (gastroesophageal reflux disease)   . Arthritis   . Asthma   . HTN (hypertension)     x 3 years  . Lung nodules 03/11/2013  .  Mediastinal adenopathy 06/20/2013    CT  & PET 2/15   . Cancer ([facility])   . NHL (non-Hodgkin's lymphoma) ([facility])     nhl dx 9/11  . History of oxygen administration     occ. oxygen use at 2 l/m as needed- not using at this time  . Sleep apnea     recent study 04-07-14 awaiting results, no cpap yet.-Dr. Glynn Octave  . COPD (chronic obstructive pulmonary disease) ([facility])     ALLERGIES:  is allergic to avapro and lipitor.  MEDICATIONS:  Current Outpatient Prescriptions  Medication Sig Dispense Refill  . acetaminophen (TYLENOL) 500 MG tablet Take 1,000 mg by mouth every 6 (six) hours as needed for headache.    . albuterol (PROAIR HFA) 108 (90 BASE) MCG/ACT inhaler Inhale 2 puffs into the lungs every 6 (six) hours as needed for wheezing or shortness of breath. 8.5 g 5  . ALPRAZolam (XANAX) 0.25 MG tablet Take 1 tablet (0.25 mg total) by mouth at bedtime as needed. 30 tablet 1  . aspirin 81 MG tablet     . Blood Glucose Calibration (TAI DOC CONTROL) NORMAL SOLN Use as directed.    . Blood Glucose Monitoring Suppl (CLEVER CHEK AUTO-CODE VOICE) DEVI USE AS DIRECTED 1 each 2  . budesonide-formoterol (SYMBICORT) 160-4.5 MCG/ACT inhaler Inhale 2 puffs into the lungs 2 (two) times daily. 1 Inhaler 3  . Cholecalciferol (VITAMIN D3) 5000 UNITS TABS Take 1 tablet by mouth every morning.    . enalapril (VASOTEC) 20 MG tablet Take 1 tablet (20 mg  total) by mouth daily. 90 tablet 0  . furosemide (LASIX) 20 MG tablet Take 20 mg by mouth daily.    . insulin NPH Human (NOVOLIN N RELION) 100 UNIT/ML injection Inject 0.35 mLs (35 Units total) into the skin 2 (two) times daily before a meal. 40 mL 2  . ipratropium-albuterol (DUONEB) 0.5-2.5 (3) MG/3ML SOLN PLACE 1 VIAL (3 MLS) IN THE NEBULIZER EVERY 6 HOURS AS NEEDED 1080 mL 0  . Lancet Devices (LANCING DEVICE) MISC USE AS DIRECTED 1 each 2  . metFORMIN (GLUCOPHAGE) 500 MG tablet TAKE ONE TABLET BY MOUTH IN THE MORNING AND TWO IN THE EVENING 270 tablet 0  . metoprolol  (LOPRESSOR) 100 MG tablet Take 1 tablet (100 mg total) by mouth 2 (two) times daily. 180 tablet 0  . simvastatin (ZOCOR) 40 MG tablet Take 1 tablet (40 mg total) by mouth daily. 90 tablet 0  . warfarin (COUMADIN) 5 MG tablet TAKE ONE & ONE-HALF TABS BY PO QD ON  MONDAY AND  FRIDAY & THEN TAKE 1 TAB THE REST OF THE WEEK 140 tablet 0  . promethazine-codeine (PHENERGAN WITH CODEINE) 6.25-10 MG/5ML syrup Take 5 mLs by mouth every 6 (six) hours as needed for cough. 200 mL 0  . [DISCONTINUED] digoxin (LANOXIN) 0.25 MG tablet Take 250 mcg by mouth daily.      No current facility-administered medications for this visit.    SURGICAL HISTORY:  Past Surgical History  Procedure Laterality Date  . Appendectomy    . Colonoscopy with propofol N/A 05/05/2014    Procedure: COLONOSCOPY WITH PROPOFOL;  Surgeon: Inda Castle, MD;  Location: [facility];  Service: Endoscopy;  Laterality: N/A;    REVIEW OF SYSTEMS:  Constitutional: negative Eyes: negative Ears, nose, mouth, throat, and face: negative Respiratory: positive for dyspnea on exertion Cardiovascular: negative Gastrointestinal: negative Genitourinary:negative Integument/breast: negative Hematologic/lymphatic: negative Musculoskeletal:negative Neurological: negative Behavioral/Psych: negative Endocrine: negative Allergic/Immunologic: negative   PHYSICAL EXAMINATION: General appearance: alert, cooperative and no distress Head: Normocephalic, without obvious abnormality, atraumatic Neck: no adenopathy, no JVD, supple, symmetrical, trachea midline and thyroid not enlarged, symmetric, no tenderness/mass/nodules Lymph nodes: Cervical, supraclavicular, and axillary nodes normal. Resp: clear to auscultation bilaterally Back: symmetric, no curvature. ROM normal. No CVA tenderness. Cardio: regular rate and rhythm, S1, S2 normal, no murmur, click, rub or gallop GI: soft, non-tender; bowel sounds normal; no masses,  no organomegaly Extremities:  extremities normal, atraumatic, no cyanosis or edema Neurologic: Alert and oriented X 3, normal strength and tone. Normal symmetric reflexes. Normal coordination and gait  ECOG PERFORMANCE STATUS: 1 - Symptomatic but completely ambulatory  Blood pressure 143/95, pulse 70, temperature 99.2 F (37.3 C), temperature source Oral, resp. rate 19, height '5\' 9"'$  (1.753 m), weight 332 lb 9.6 oz (150.866 kg), SpO2 92 %.  LABORATORY DATA: Lab Results  Component Value Date   WBC 6.5 04/09/2015   HGB 14.7 04/09/2015   HCT 45.6 04/09/2015   MCV 93.4 04/09/2015   PLT 131* 04/09/2015      Chemistry      Component Value Date/Time   NA 140 04/09/2015 0908   NA 142 12/26/2014 0828   NA 141 03/17/2014 0806   NA 136 11/01/2011 0753   K 4.4 04/09/2015 0908   K 5.0 12/26/2014 0828   K 4.8* 11/01/2011 0753   CL 100 12/26/2014 0828   CL 100 09/03/2012 0751   CL 91* 11/01/2011 0753   CO2 29 04/09/2015 0908   CO2 27 12/26/2014 0828   CO2  34* 11/01/2011 0753   BUN 24.9 04/09/2015 0908   BUN 21 12/26/2014 0828   BUN 19 03/17/2014 0806   BUN 18 11/01/2011 0753   CREATININE 1.0 04/09/2015 0908   CREATININE 1.02 12/26/2014 0828   CREATININE 0.82 11/14/2012 0918   CREATININE 0.8 03/29/2012   GLU 185 03/29/2012      Component Value Date/Time   CALCIUM 9.6 04/09/2015 0908   CALCIUM 9.5 12/26/2014 0828   CALCIUM 8.8 11/01/2011 0753   ALKPHOS 59 04/09/2015 0908   ALKPHOS 65 12/26/2014 0828   ALKPHOS 57 11/01/2011 0753   AST 17 04/09/2015 0908   AST 20 12/26/2014 0828   AST 14 11/01/2011 0753   ALT 15 04/09/2015 0908   ALT 15 12/26/2014 0828   ALT 17 11/01/2011 0753   BILITOT 1.38* 04/09/2015 0908   BILITOT 1.1 12/26/2014 0828   BILITOT 1.0 04/03/2014 0813   BILITOT 1.50 11/01/2011 0753       RADIOGRAPHIC STUDIES: Ct Chest W Contrast  04/09/2015  CLINICAL DATA:  Non-Hodgkin's lymphoma initially diagnosed in 2011. EXAM: CT CHEST, ABDOMEN, AND PELVIS WITH CONTRAST TECHNIQUE: Multidetector  CT imaging of the chest, abdomen and pelvis was performed following the standard protocol during bolus administration of intravenous contrast. CONTRAST:  166m OMNIPAQUE IOHEXOL 300 MG/ML  SOLN COMPARISON:  CT scan 03/17/2014 and PET-CT 06/19/2013 FINDINGS: CT CHEST FINDINGS Mediastinum/Lymph Nodes: No chest wall mass, supraclavicular or axillary lymphadenopathy. The lower aspect of the thyroid gland appears normal. The heart is normal in size. No pericardial effusion. Stable aortic and coronary artery calcifications. Small scattered mediastinal lymph nodes are stable. Prevascular node on image 21 measures 7.5 mm and is unchanged. Pretracheal lymph node on image 19 measures 6.5 mm and is stable. Small subcarinal nodes are also unchanged. Lungs/Pleura: No acute pulmonary findings. Stable tiny scattered pulmonary nodules. No worrisome lesions or new lesions. No interstitial lung disease or bronchiectasis. No pleural effusion. Stable slightly prominent pleural fat. Musculoskeletal: No significant bony findings. CT ABDOMEN PELVIS FINDINGS Hepatobiliary: No focal hepatic lesions or intrahepatic biliary dilatation. Stable cholelithiasis. No common bile duct dilatation. Pancreas: No mass, inflammation or ductal dilatation. Mild stable atrophy. Spleen: Normal size.  No focal lesions. Adrenals/Urinary Tract: The adrenal glands and kidneys are unremarkable and stable. Stomach/Bowel: The stomach, duodenum, small bowel and colon are unremarkable. No inflammatory changes, mass lesions or obstructive findings. The terminal ileum is normal. Vascular/Lymphatic: Stable atherosclerotic calcifications involving the aorta and branch vessels. No aneurysm or dissection. The major venous structures are patent. No significant change in the retroperitoneal lymph node on image number 76. It measures 24.5 x 14 mm. A few other small scattered lymph nodes are noted but no overt adenopathy. Reproductive: The bladder, prostate gland and seminal  vesicles are unremarkable. Other: No pelvic mass or adenopathy. No free pelvic fluid collections. No inguinal mass or adenopathy. Prominent inguinal rings containing fat. Musculoskeletal: Stable sclerotic and slightly enlarged right tenth rib posteriorly. This could be treated osseous lymphoma or possibly Paget's disease or fibrous dysplasia. No other definite bone lesions are identified. IMPRESSION: 1. Tiny scattered sub 4 mm pulmonary nodules are stable. 2. No adenopathy in the chest. 3. Stable retroperitoneal lymph node. No new or progressive lymph nodes in the abdomen/pelvis. 4. Stable sclerotic and slightly enlarged right tenth rib as discussed above. Electronically Signed   By: PMarijo SanesM.D.   On: 04/09/2015 11:24   Ct Abdomen Pelvis W Contrast  04/09/2015  CLINICAL DATA:  Non-Hodgkin's lymphoma initially diagnosed in 2011.  EXAM: CT CHEST, ABDOMEN, AND PELVIS WITH CONTRAST TECHNIQUE: Multidetector CT imaging of the chest, abdomen and pelvis was performed following the standard protocol during bolus administration of intravenous contrast. CONTRAST:  148m OMNIPAQUE IOHEXOL 300 MG/ML  SOLN COMPARISON:  CT scan 03/17/2014 and PET-CT 06/19/2013 FINDINGS: CT CHEST FINDINGS Mediastinum/Lymph Nodes: No chest wall mass, supraclavicular or axillary lymphadenopathy. The lower aspect of the thyroid gland appears normal. The heart is normal in size. No pericardial effusion. Stable aortic and coronary artery calcifications. Small scattered mediastinal lymph nodes are stable. Prevascular node on image 21 measures 7.5 mm and is unchanged. Pretracheal lymph node on image 19 measures 6.5 mm and is stable. Small subcarinal nodes are also unchanged. Lungs/Pleura: No acute pulmonary findings. Stable tiny scattered pulmonary nodules. No worrisome lesions or new lesions. No interstitial lung disease or bronchiectasis. No pleural effusion. Stable slightly prominent pleural fat. Musculoskeletal: No significant bony findings.  CT ABDOMEN PELVIS FINDINGS Hepatobiliary: No focal hepatic lesions or intrahepatic biliary dilatation. Stable cholelithiasis. No common bile duct dilatation. Pancreas: No mass, inflammation or ductal dilatation. Mild stable atrophy. Spleen: Normal size.  No focal lesions. Adrenals/Urinary Tract: The adrenal glands and kidneys are unremarkable and stable. Stomach/Bowel: The stomach, duodenum, small bowel and colon are unremarkable. No inflammatory changes, mass lesions or obstructive findings. The terminal ileum is normal. Vascular/Lymphatic: Stable atherosclerotic calcifications involving the aorta and branch vessels. No aneurysm or dissection. The major venous structures are patent. No significant change in the retroperitoneal lymph node on image number 76. It measures 24.5 x 14 mm. A few other small scattered lymph nodes are noted but no overt adenopathy. Reproductive: The bladder, prostate gland and seminal vesicles are unremarkable. Other: No pelvic mass or adenopathy. No free pelvic fluid collections. No inguinal mass or adenopathy. Prominent inguinal rings containing fat. Musculoskeletal: Stable sclerotic and slightly enlarged right tenth rib posteriorly. This could be treated osseous lymphoma or possibly Paget's disease or fibrous dysplasia. No other definite bone lesions are identified. IMPRESSION: 1. Tiny scattered sub 4 mm pulmonary nodules are stable. 2. No adenopathy in the chest. 3. Stable retroperitoneal lymph node. No new or progressive lymph nodes in the abdomen/pelvis. 4. Stable sclerotic and slightly enlarged right tenth rib as discussed above. Electronically Signed   By: PMarijo SanesM.D.   On: 04/09/2015 11:24    ASSESSMENT AND PLAN: This is a very pleasant 64years old white male with history of high-grade be set non-Hodgkin lymphoma suspicious for Burkitt's lymphoma but with negative cytogenetics for 8;14 abnormality status post 6 cycles of systemic chemotherapy with Rituxan and infusional  CHOPE completed in early 2012.  The recent CT scan of the chest, abdomen and pelvis showed no concerning findings for disease progression. I discussed the scan results with the patient and his wife.  The patient was found recently to have some evidence for monoclonal gammopathy of undetermined significance with IgM monoclonal protein and kappa light chain specificity. I recommended for him to continue on observation with repeat CBC, comprehensive metabolic panel, LDH and myeloma panel in 6 months. He was advised to call immediately if he has any concerning symptoms in the interval. The patient voices understanding of current disease status and treatment options and is in agreement with the current care plan.  All questions were answered. The patient knows to call the clinic with any problems, questions or concerns. We can certainly see the patient much sooner if necessary.  Disclaimer: This note was dictated with voice recognition software. Similar sounding words  can inadvertently be transcribed and may not be corrected upon review.

## 2015-04-17 ENCOUNTER — Ambulatory Visit (INDEPENDENT_AMBULATORY_CARE_PROVIDER_SITE_OTHER): Payer: Commercial Managed Care - HMO | Admitting: Pharmacist

## 2015-04-17 DIAGNOSIS — I482 Chronic atrial fibrillation, unspecified: Secondary | ICD-10-CM

## 2015-04-17 DIAGNOSIS — Z7901 Long term (current) use of anticoagulants: Secondary | ICD-10-CM

## 2015-04-17 DIAGNOSIS — I4891 Unspecified atrial fibrillation: Secondary | ICD-10-CM

## 2015-04-17 LAB — POCT INR: INR: 2.4

## 2015-04-17 NOTE — Patient Instructions (Signed)
Anticoagulation Dose Instructions as of 04/17/2015      Dorene Grebe Tue Wed Thu Fri Sat   New Dose 5 mg 7.5 mg 5 mg 5 mg 5 mg 7.5 mg 5 mg    Description        Continue warfarin 7.5mg  mondays and fridays and 5mg  all other days.     INR was 2.4 today

## 2015-04-27 ENCOUNTER — Other Ambulatory Visit: Payer: Self-pay | Admitting: Family Medicine

## 2015-04-28 ENCOUNTER — Other Ambulatory Visit: Payer: Self-pay | Admitting: Family Medicine

## 2015-05-01 ENCOUNTER — Other Ambulatory Visit: Payer: Self-pay | Admitting: Family Medicine

## 2015-05-01 NOTE — Telephone Encounter (Signed)
rx called into pharmacy

## 2015-05-01 NOTE — Telephone Encounter (Signed)
[facility] pt. Last filled 03/01/15, last filled 12/26/14. Call in at [facility]

## 2015-05-03 DIAGNOSIS — J441 Chronic obstructive pulmonary disease with (acute) exacerbation: Secondary | ICD-10-CM | POA: Diagnosis not present

## 2015-05-03 DIAGNOSIS — Z23 Encounter for immunization: Secondary | ICD-10-CM | POA: Diagnosis not present

## 2015-05-03 DIAGNOSIS — G4719 Other hypersomnia: Secondary | ICD-10-CM | POA: Diagnosis not present

## 2015-05-03 DIAGNOSIS — G4733 Obstructive sleep apnea (adult) (pediatric): Secondary | ICD-10-CM | POA: Diagnosis not present

## 2015-05-06 ENCOUNTER — Other Ambulatory Visit: Payer: Self-pay | Admitting: Family Medicine

## 2015-05-09 DIAGNOSIS — J449 Chronic obstructive pulmonary disease, unspecified: Secondary | ICD-10-CM | POA: Diagnosis not present

## 2015-05-09 DIAGNOSIS — J9611 Chronic respiratory failure with hypoxia: Secondary | ICD-10-CM | POA: Diagnosis not present

## 2015-05-11 NOTE — Telephone Encounter (Signed)
Pt says he doesn't use this company

## 2015-05-12 DIAGNOSIS — J449 Chronic obstructive pulmonary disease, unspecified: Secondary | ICD-10-CM | POA: Diagnosis not present

## 2015-05-12 DIAGNOSIS — J439 Emphysema, unspecified: Secondary | ICD-10-CM | POA: Diagnosis not present

## 2015-05-25 ENCOUNTER — Encounter: Payer: Self-pay | Admitting: Family Medicine

## 2015-05-25 ENCOUNTER — Ambulatory Visit (INDEPENDENT_AMBULATORY_CARE_PROVIDER_SITE_OTHER): Payer: Commercial Managed Care - HMO | Admitting: Family Medicine

## 2015-05-25 VITALS — BP 137/82 | HR 61 | Temp 97.3°F | Ht 69.0 in | Wt 328.0 lb

## 2015-05-25 DIAGNOSIS — I482 Chronic atrial fibrillation, unspecified: Secondary | ICD-10-CM

## 2015-05-25 DIAGNOSIS — E1165 Type 2 diabetes mellitus with hyperglycemia: Secondary | ICD-10-CM | POA: Diagnosis not present

## 2015-05-25 DIAGNOSIS — K219 Gastro-esophageal reflux disease without esophagitis: Secondary | ICD-10-CM

## 2015-05-25 DIAGNOSIS — G4733 Obstructive sleep apnea (adult) (pediatric): Secondary | ICD-10-CM | POA: Diagnosis not present

## 2015-05-25 DIAGNOSIS — Z794 Long term (current) use of insulin: Secondary | ICD-10-CM | POA: Diagnosis not present

## 2015-05-25 DIAGNOSIS — E1140 Type 2 diabetes mellitus with diabetic neuropathy, unspecified: Secondary | ICD-10-CM

## 2015-05-25 DIAGNOSIS — Z7901 Long term (current) use of anticoagulants: Secondary | ICD-10-CM | POA: Diagnosis not present

## 2015-05-25 DIAGNOSIS — IMO0002 Reserved for concepts with insufficient information to code with codable children: Secondary | ICD-10-CM

## 2015-05-25 DIAGNOSIS — J9611 Chronic respiratory failure with hypoxia: Secondary | ICD-10-CM

## 2015-05-25 DIAGNOSIS — E559 Vitamin D deficiency, unspecified: Secondary | ICD-10-CM

## 2015-05-25 DIAGNOSIS — C8238 Follicular lymphoma grade IIIa, lymph nodes of multiple sites: Secondary | ICD-10-CM | POA: Diagnosis not present

## 2015-05-25 DIAGNOSIS — E785 Hyperlipidemia, unspecified: Secondary | ICD-10-CM | POA: Diagnosis not present

## 2015-05-25 DIAGNOSIS — I10 Essential (primary) hypertension: Secondary | ICD-10-CM | POA: Diagnosis not present

## 2015-05-25 LAB — POCT GLYCOSYLATED HEMOGLOBIN (HGB A1C): Hemoglobin A1C: 7.3

## 2015-05-25 LAB — POCT INR: INR: 3.5

## 2015-05-25 NOTE — Progress Notes (Signed)
Subjective:    Patient ID: Justin Ruiz, male    DOB: 07-14-1950, 65 y.o.   MRN: [id number]  HPI Pt here for follow up and management of chronic medical problems which includes diabetes, hyperlipidemia, and hypertension. He is taking medications regularly. The patient is stable and doing well today with no specific complaints. He has atrial fibrillation and we'll get a ProTime today. He continues to see the oncologist and the pulmonologist. Burnis Medin get routine lab work today. He continues to have a BMI that is very elevated. The patient saw the oncologist in December and a CT scan that was done revealed no progression of his disease at that time. He will get repeat CT scan in 6 months from that time. The patient sees the cardiologist Lelon Frohlich the pulmonologist yearly. He is currently seeing the oncologist every 6 months. He denies chest pain shortness of breath other than increased congestion with a cold but he currently has. He is not running any fever. He denies any trouble swallowing heartburn indigestion nausea vomiting diarrhea or blood in the stool. He has not had any black tarry bowel movements and is passing his water without problems and without any blood noted. He does take Coumadin for his atrial fibrillation. He will get a ProTime today. The patient notes that his home blood sugars fasting have been running in the 120-160 range.      Patient Active Problem List   Diagnosis Date Noted  . Diabetic neuropathy, type II diabetes mellitus ([place]) 09/25/2014  . Chronic respiratory failure with hypoxia ([place]) 05/25/2014  . Obstructive sleep apnea 05/25/2014  . Benign neoplasm of ascending colon 05/05/2014  . Excessive daytime sleepiness 01/24/2014  . Mediastinal adenopathy 06/20/2013  . Severe obesity (BMI >= 40) ([place]) 05/01/2013  . Generalized anxiety disorder 05/01/2013  . Lung nodules 03/11/2013  . Chronic anticoagulation 08/20/2012  . FIBRILLATION, ATRIAL 05/12/2010  . NHL (non-Hodgkin's  lymphoma) ([place]) 01/26/2010  . Obesity, morbid ([place]) 04/27/2009  . CARDIOVASCULAR STUDIES, ABNORMAL 04/27/2009  . ABNORMAL STRESS ELECTROCARDIOGRAM 03/25/2009  . PERSONAL HISTORY OF COLONIC POLYPS 03/02/2009  . Diabetes mellitus type 2, insulin dependent ([place]) 04/29/2007  . Hyperlipidemia 04/29/2007  . Hypertension 04/29/2007  . Seasonal and perennial allergic rhinitis 04/29/2007  . COPD exacerbation ([employer]) 04/29/2007  . G E R D 04/29/2007  . BENIGN PROSTATIC HYPERTROPHY, HX OF 04/29/2007   Outpatient Encounter Prescriptions as of 05/25/2015  Medication Sig  . acetaminophen (TYLENOL) 500 MG tablet Take 1,000 mg by mouth every 6 (six) hours as needed for headache. Reported on 04/15/2015  . albuterol (PROAIR HFA) 108 (90 BASE) MCG/ACT inhaler Inhale 2 puffs into the lungs every 6 (six) hours as needed for wheezing or shortness of breath.  . ALPRAZolam (XANAX) 0.25 MG tablet TAKE ONE TABLET BY MOUTH AT BEDTIME  . Blood Glucose Calibration (TAI DOC CONTROL) NORMAL SOLN Use as directed.  . Blood Glucose Monitoring Suppl (CLEVER CHEK AUTO-CODE VOICE) DEVI USE AS DIRECTED  . budesonide-formoterol (SYMBICORT) 160-4.5 MCG/ACT inhaler Inhale 2 puffs into the lungs 2 (two) times daily.  . Cholecalciferol (VITAMIN D3) 5000 UNITS TABS Take 1 tablet by mouth every morning.  . enalapril (VASOTEC) 20 MG tablet Take 1 tablet (20 mg total) by mouth daily.  . furosemide (LASIX) 20 MG tablet Take 20 mg by mouth daily.  . insulin NPH Human (NOVOLIN N RELION) 100 UNIT/ML injection Inject 0.35 mLs (35 Units total) into the skin 2 (two) times daily before a meal.  . ipratropium-albuterol (DUONEB)  0.5-2.5 (3) MG/3ML SOLN PLACE 1 VIAL (3 MLS) IN THE NEBULIZER EVERY 6 HOURS AS NEEDED  . Lancet Devices (LANCING DEVICE) MISC USE AS DIRECTED  . metFORMIN (GLUCOPHAGE) 500 MG tablet TAKE ONE TABLET BY MOUTH IN THE MORNING AND TWO IN THE EVENING  . metoprolol (LOPRESSOR) 100 MG tablet TAKE ONE TABLET BY MOUTH TWICE DAILY    . NOVOLIN R RELION 100 UNIT/ML injection Inject 32 Units into the skin 2 (two) times daily before a meal.  . promethazine-codeine (PHENERGAN WITH CODEINE) 6.25-10 MG/5ML syrup Take 5 mLs by mouth every 6 (six) hours as needed for cough.  . simvastatin (ZOCOR) 40 MG tablet Take 1 tablet (40 mg total) by mouth daily.  Marland Kitchen warfarin (COUMADIN) 5 MG tablet TAKE ONE & ONE-HALF TABS BY PO QD ON  MONDAY AND  FRIDAY & THEN TAKE 1 TAB THE REST OF THE WEEK   No facility-administered encounter medications on file as of 05/25/2015.      Review of Systems  Constitutional: Negative.   HENT: Negative.   Eyes: Negative.   Respiratory: Negative.   Cardiovascular: Negative.   Gastrointestinal: Negative.   Endocrine: Negative.   Genitourinary: Negative.   Musculoskeletal: Negative.   Skin: Negative.   Allergic/Immunologic: Negative.   Neurological: Negative.   Hematological: Negative.   Psychiatric/Behavioral: Negative.        Objective:   Physical Exam  Constitutional: He is oriented to person, place, and time. He appears well-developed and well-nourished. No distress.  The patient is alert and in no acute distress and pleasant.  HENT:  Head: Normocephalic and atraumatic.  Right Ear: External ear normal.  Left Ear: External ear normal.  Mouth/Throat: Oropharynx is clear and moist. No oropharyngeal exudate.  Minimal nasal congestion  Eyes: Conjunctivae and EOM are normal. Pupils are equal, round, and reactive to light. Right eye exhibits no discharge. Left eye exhibits no discharge. No scleral icterus.  Neck: Normal range of motion. Neck supple. No thyromegaly present.  No bruits adenopathy or thyromegaly  Cardiovascular: Normal rate, normal heart sounds and intact distal pulses.   No murmur heard. The heart is irregular irregular at 72/m  Pulmonary/Chest: Effort normal. No respiratory distress. He has wheezes. He has no rales. He exhibits no tenderness.  Slightly decreased breath sounds and  faint wheezes  Abdominal: Soft. Bowel sounds are normal. He exhibits no mass. There is no tenderness. There is no rebound and no guarding.  Morbid obesity without organ enlargement and with normal bowel sounds. No masses and no inguinal adenopathy  Musculoskeletal: Normal range of motion. He exhibits no edema or tenderness.  Lymphadenopathy:    He has no cervical adenopathy.  Neurological: He is alert and oriented to person, place, and time. He has normal reflexes. No cranial nerve deficit.  Skin: Skin is warm and dry. No rash noted.  Psychiatric: He has a normal mood and affect. His behavior is normal. Judgment and thought content normal.  Nursing note and vitals reviewed.  BP 137/82 mmHg  Pulse 61  Temp(Src) 97.3 F (36.3 C) (Oral)  Ht '5\' 9"'$  (1.753 m)  Wt 328 lb (148.78 kg)  BMI 48.42 kg/m2        Assessment & Plan:  1. Hypertension -Blood pressure is good today the patient should continue with current treatment - BMP8+EGFR - CBC with Differential/Platelet - Hepatic function panel  2. Uncontrolled type 2 diabetes mellitus with diabetic neuropathy, with long-term current use of insulin ([place]) -Continue with aggressive therapeutic lifestyle changes and  current treatment pending results of hemoglobin A1c and weight loss - POCT glycosylated hemoglobin (Hb A1C) - BMP8+EGFR - CBC with Differential/Platelet  3. Hyperlipidemia -Continue current treatment pending results of lab work - CBC with Differential/Platelet - NMR, lipoprofile  4. Vitamin D deficiency -Continue with vitamin D replacement and the results of lab - CBC with Differential/Platelet - VITAMIN D 25 Hydroxy (Vit-D Deficiency, Fractures)  5. Gastroesophageal reflux disease, esophagitis presence not specified -Continue to watch diet closely and lose weight and avoid foods that are irritating to her stomach - CBC with Differential/Platelet - Hepatic function panel  6. Chronic atrial fibrillation (HCC) -Adjust  Coumadin as directed and follow-up with cardiology as planned - POCT INR  7. Morbid obesity due to excess calories ([place]) -Continue with aggressive therapeutic lifestyle changes which include diet and exercise and make all efforts possible to lose more weight  8. Chronic anticoagulation -Coumadin as directed  9. Obstructive sleep apnea -Follow up with pulmonology as planned  10. 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 Chronic respiratory failure with hypoxia (HCC) -Yearly follow-up with pulmonology  Patient Instructions   Anticoagulation Dose Instructions as of 05/25/2015      Dorene Grebe Tue Wed Thu Fri Sat   New Dose 5 mg 7.5 _0  mg    Description        No warfarin today - Monday, January 30th, then decrease warfarin dose to  7.33m mondays only and 536mall other days.     INR was 3.5 today                        Medicare Annual Wellness Visit  [place] and the medical providers at [facility] to bring you the best medical care.  In doing so we not only want to address your current medical conditions and concerns but also to detect new conditions early and prevent illness, disease and health-related problems.    Medicare offers a yearly Wellness Visit which allows our clinical staff to assess your need for preventative services including immunizations, lifestyle education, counseling to decrease risk of preventable diseases and screening for fall risk and other medical concerns.    This visit is provided free of charge (no copay) for all Medicare recipients. [facility] to conduct these Wellness Visits which will also include a thorough review of all your medications.    As you primary medical provider recommend that you make an appointment for your Annual Wellness Visit if you have not done so  already this year.  You may set up this appointment before you leave today or you may call back [phone] schedule an appointment.  Please make sure when you call that you mention that you are scheduling your Annual Wellness Visit with the clinical pharmacist so that the appointment may be made for the proper length of time.     Continue current medications. Continue good therapeutic lifestyle changes which include good diet and exercise. Fall precautions discussed with patient. If an FOBT was given today- please return it to our front desk. If you are over 5041ears old - you may need Prevnar 1347r the adult Pneumonia vaccine.    After your visit with [facility] you will receive a survey in the mail or online from [employer]  regarding your care with [place]. Please take a moment to fill this out. Your feedback is very important to [place] as you can help [place] better understand your patient needs as well as improve your experience and satisfaction. WE CARE ABOUT YOU!!!    continue to work on getting weight down Use nebulizers and inhalers regularly Drink plenty of fluids and stay well hydrated Use cool mist humidification and take Mucinex maximum strength, blue and white in color, plain, 1 twice daily with a large glass of water Use nasal saline If any worsening of current upper respiratory infection with fever or purulent sputum call us and let us know of this. Continue to follow-up with cardiology pulmonology and oncology We will call with lab work results as soon as they become available Use moisturizers on lower extremities Avoid soap and fabric softener and detergent that is scented Continue to work aggressively on weight loss. This is very very important.   Arrie Senate MD

## 2015-05-25 NOTE — Patient Instructions (Addendum)
Anticoagulation Dose Instructions as of 05/25/2015      Dorene Grebe Tue Wed Thu Fri Sat   New Dose 5 mg 7.5 mg 5 mg 5 mg 5 mg 5 mg 5 mg    Description        No warfarin today - Monday, January 30th, then decrease warfarin dose to  7.5mg  mondays only and 5mg  all other days.     INR was 3.5 today                        Medicare Annual Wellness Visit  [place] and the medical providers at [place] strive to bring you the best medical care.  In doing so we not only want to address your current medical conditions and concerns but also to detect new conditions early and prevent illness, disease and health-related problems.    Medicare offers a yearly Wellness Visit which allows our clinical staff to assess your need for preventative services including immunizations, lifestyle education, counseling to decrease risk of preventable diseases and screening for fall risk and other medical concerns.    This visit is provided free of charge (no copay) for all Medicare recipients. The clinical pharmacists at [facility] have begun to conduct these Wellness Visits which will also include a thorough review of all your medications.    As you primary medical provider recommend that you make an appointment for your Annual Wellness Visit if you have not done so already this year.  You may set up this appointment before you leave today or you may call back [phone] and schedule an appointment.  Please make sure when you call that you mention that you are scheduling your Annual Wellness Visit with the clinical pharmacist so that the appointment may be made for the proper length of time.     Continue current medications. Continue good therapeutic lifestyle changes which include good diet and exercise. Fall precautions discussed with patient. If an FOBT was given today- please return it to our front desk. If you are over 80 years old - you may need Prevnar 74 or  the adult Pneumonia vaccine.    After your visit with [place] today you will receive a survey in the mail or online from [employer] regarding your care with [place]. Please take a moment to fill this out. Your feedback is very important to [place] as you can help [place] better understand your patient needs as well as improve your experience and satisfaction. WE CARE ABOUT YOU!!!    continue to work on getting weight down Use nebulizers and inhalers regularly Drink plenty of fluids and stay well hydrated Use cool mist humidification and take Mucinex maximum strength, blue and white in color, plain, 1 twice daily with a large glass of water Use nasal saline If any worsening of current upper respiratory infection with fever or purulent sputum call us and let us know of this. Continue to follow-up with cardiology pulmonology and oncology We will call with lab work results as soon as they become available Use moisturizers on lower extremities Avoid soap and fabric softener and detergent that is scented Continue to work aggressively on weight loss. This is very very important.

## 2015-05-26 LAB — BMP8+EGFR
BUN/Creatinine Ratio: 22 (ref 10–22)
BUN: 21 mg/dL (ref 8–27)
CALCIUM: 9.7 mg/dL (ref 8.6–10.2)
CHLORIDE: 99 mmol/L (ref 96–106)
CO2: 27 mmol/L (ref 18–29)
Creatinine, Ser: 0.97 mg/dL (ref 0.76–1.27)
GFR calc non Af Amer: 82 mL/min/{1.73_m2} (ref >59)
GFR, EST AFRICAN AMERICAN: 95 mL/min/{1.73_m2} (ref >59)
GLUCOSE: 191 mg/dL — AB (ref 65–99)
POTASSIUM: 4.7 mmol/L (ref 3.5–5.2)
Sodium: 141 mmol/L (ref 134–144)

## 2015-05-26 LAB — CBC WITH DIFFERENTIAL/PLATELET
BASOS ABS: 0 10*3/uL (ref 0.0–0.2)
Basos: 0 %
EOS (ABSOLUTE): 0.1 10*3/uL (ref 0.0–0.4)
Eos: 2 %
Hematocrit: 44.4 % (ref 37.5–51.0)
Hemoglobin: 15 g/dL (ref 12.6–17.7)
IMMATURE GRANS (ABS): 0 10*3/uL (ref 0.0–0.1)
Immature Granulocytes: 0 %
LYMPHS: 28 %
Lymphocytes Absolute: 1.8 10*3/uL (ref 0.7–3.1)
MCH: 31.5 pg (ref 26.6–33.0)
MCHC: 33.8 g/dL (ref 31.5–35.7)
MCV: 93 fL (ref 79–97)
Monocytes Absolute: 0.4 10*3/uL (ref 0.1–0.9)
Monocytes: 6 %
NEUTROS ABS: 4.2 10*3/uL (ref 1.4–7.0)
NEUTROS PCT: 64 %
PLATELETS: 149 10*3/uL — AB (ref 150–379)
RBC: 4.76 x10E6/uL (ref 4.14–5.80)
RDW: 13.8 % (ref 12.3–15.4)
WBC: 6.6 10*3/uL (ref 3.4–10.8)

## 2015-05-26 LAB — NMR, LIPOPROFILE
CHOLESTEROL: 150 mg/dL (ref 100–199)
HDL Cholesterol by NMR: 42 mg/dL (ref >39)
HDL PARTICLE NUMBER: 32.3 umol/L (ref >=30.5)
LDL PARTICLE NUMBER: 745 nmol/L (ref <1000)
LDL SIZE: 20.2 nm (ref >20.5)
LDL-C: 64 mg/dL (ref 0–99)
LP-IR Score: 61 — ABNORMAL HIGH (ref <=45)
Small LDL Particle Number: 413 nmol/L (ref <=527)
TRIGLYCERIDES BY NMR: 218 mg/dL — AB (ref 0–149)

## 2015-05-26 LAB — HEPATIC FUNCTION PANEL
ALBUMIN: 3.8 g/dL (ref 3.6–4.8)
ALT: 13 IU/L (ref 0–44)
AST: 17 IU/L (ref 0–40)
Alkaline Phosphatase: 58 IU/L (ref 39–117)
BILIRUBIN, DIRECT: 0.26 mg/dL (ref 0.00–0.40)
Bilirubin Total: 1.1 mg/dL (ref 0.0–1.2)
TOTAL PROTEIN: 6.5 g/dL (ref 6.0–8.5)

## 2015-05-26 LAB — VITAMIN D 25 HYDROXY (VIT D DEFICIENCY, FRACTURES): Vit D, 25-Hydroxy: 37 ng/mL (ref 30.0–100.0)

## 2015-05-28 ENCOUNTER — Other Ambulatory Visit: Payer: Self-pay | Admitting: Family Medicine

## 2015-05-29 ENCOUNTER — Other Ambulatory Visit: Payer: Self-pay | Admitting: Family Medicine

## 2015-06-01 ENCOUNTER — Other Ambulatory Visit: Payer: Commercial Managed Care - HMO

## 2015-06-01 DIAGNOSIS — Z1212 Encounter for screening for malignant neoplasm of rectum: Secondary | ICD-10-CM | POA: Diagnosis not present

## 2015-06-03 DIAGNOSIS — J441 Chronic obstructive pulmonary disease with (acute) exacerbation: Secondary | ICD-10-CM | POA: Diagnosis not present

## 2015-06-03 DIAGNOSIS — G4733 Obstructive sleep apnea (adult) (pediatric): Secondary | ICD-10-CM | POA: Diagnosis not present

## 2015-06-03 DIAGNOSIS — Z23 Encounter for immunization: Secondary | ICD-10-CM | POA: Diagnosis not present

## 2015-06-03 DIAGNOSIS — G4719 Other hypersomnia: Secondary | ICD-10-CM | POA: Diagnosis not present

## 2015-06-04 LAB — FECAL OCCULT BLOOD, IMMUNOCHEMICAL: FECAL OCCULT BLD: NEGATIVE

## 2015-06-07 ENCOUNTER — Inpatient Hospital Stay (HOSPITAL_COMMUNITY)
Admission: EM | Admit: 2015-06-07 | Discharge: 2015-06-09 | DRG: 189 | Disposition: A | Discharge status: Home / Self Care | Payer: Commercial Managed Care - HMO | Attending: Internal Medicine | Admitting: Internal Medicine

## 2015-06-07 ENCOUNTER — Encounter (HOSPITAL_COMMUNITY): Payer: Self-pay | Admitting: *Deleted

## 2015-06-07 ENCOUNTER — Emergency Department (HOSPITAL_COMMUNITY): Payer: Commercial Managed Care - HMO

## 2015-06-07 DIAGNOSIS — J9621 Acute and chronic respiratory failure with hypoxia: Secondary | ICD-10-CM | POA: Diagnosis not present

## 2015-06-07 DIAGNOSIS — T380X5A Adverse effect of glucocorticoids and synthetic analogues, initial encounter: Secondary | ICD-10-CM | POA: Diagnosis present

## 2015-06-07 DIAGNOSIS — Z794 Long term (current) use of insulin: Secondary | ICD-10-CM

## 2015-06-07 DIAGNOSIS — C8590 Non-Hodgkin lymphoma, unspecified, unspecified site: Secondary | ICD-10-CM | POA: Diagnosis present

## 2015-06-07 DIAGNOSIS — I4891 Unspecified atrial fibrillation: Secondary | ICD-10-CM | POA: Diagnosis present

## 2015-06-07 DIAGNOSIS — R918 Other nonspecific abnormal finding of lung field: Secondary | ICD-10-CM | POA: Diagnosis present

## 2015-06-07 DIAGNOSIS — A419 Sepsis, unspecified organism: Secondary | ICD-10-CM

## 2015-06-07 DIAGNOSIS — I959 Hypotension, unspecified: Secondary | ICD-10-CM | POA: Diagnosis not present

## 2015-06-07 DIAGNOSIS — J45909 Unspecified asthma, uncomplicated: Secondary | ICD-10-CM | POA: Diagnosis present

## 2015-06-07 DIAGNOSIS — Z9981 Dependence on supplemental oxygen: Secondary | ICD-10-CM

## 2015-06-07 DIAGNOSIS — E1165 Type 2 diabetes mellitus with hyperglycemia: Secondary | ICD-10-CM | POA: Diagnosis present

## 2015-06-07 DIAGNOSIS — N400 Enlarged prostate without lower urinary tract symptoms: Secondary | ICD-10-CM | POA: Diagnosis present

## 2015-06-07 DIAGNOSIS — J189 Pneumonia, unspecified organism: Secondary | ICD-10-CM | POA: Diagnosis not present

## 2015-06-07 DIAGNOSIS — E669 Obesity, unspecified: Secondary | ICD-10-CM | POA: Diagnosis present

## 2015-06-07 DIAGNOSIS — J440 Chronic obstructive pulmonary disease with acute lower respiratory infection: Secondary | ICD-10-CM | POA: Diagnosis not present

## 2015-06-07 DIAGNOSIS — Z86010 Personal history of colonic polyps: Secondary | ICD-10-CM

## 2015-06-07 DIAGNOSIS — Z6841 Body Mass Index (BMI) 40.0 and over, adult: Secondary | ICD-10-CM

## 2015-06-07 DIAGNOSIS — K219 Gastro-esophageal reflux disease without esophagitis: Secondary | ICD-10-CM | POA: Diagnosis present

## 2015-06-07 DIAGNOSIS — E785 Hyperlipidemia, unspecified: Secondary | ICD-10-CM | POA: Diagnosis present

## 2015-06-07 DIAGNOSIS — M1990 Unspecified osteoarthritis, unspecified site: Secondary | ICD-10-CM | POA: Diagnosis present

## 2015-06-07 DIAGNOSIS — R590 Localized enlarged lymph nodes: Secondary | ICD-10-CM | POA: Diagnosis present

## 2015-06-07 DIAGNOSIS — E119 Type 2 diabetes mellitus without complications: Secondary | ICD-10-CM | POA: Diagnosis not present

## 2015-06-07 DIAGNOSIS — R0602 Shortness of breath: Secondary | ICD-10-CM | POA: Diagnosis not present

## 2015-06-07 DIAGNOSIS — J441 Chronic obstructive pulmonary disease with (acute) exacerbation: Secondary | ICD-10-CM | POA: Diagnosis present

## 2015-06-07 DIAGNOSIS — I482 Chronic atrial fibrillation: Secondary | ICD-10-CM | POA: Diagnosis not present

## 2015-06-07 DIAGNOSIS — Z87891 Personal history of nicotine dependence: Secondary | ICD-10-CM | POA: Diagnosis not present

## 2015-06-07 DIAGNOSIS — I10 Essential (primary) hypertension: Secondary | ICD-10-CM | POA: Diagnosis present

## 2015-06-07 DIAGNOSIS — G4733 Obstructive sleep apnea (adult) (pediatric): Secondary | ICD-10-CM | POA: Diagnosis present

## 2015-06-07 DIAGNOSIS — J9620 Acute and chronic respiratory failure, unspecified whether with hypoxia or hypercapnia: Principal | ICD-10-CM | POA: Diagnosis present

## 2015-06-07 DIAGNOSIS — E875 Hyperkalemia: Secondary | ICD-10-CM | POA: Diagnosis present

## 2015-06-07 DIAGNOSIS — I5032 Chronic diastolic (congestive) heart failure: Secondary | ICD-10-CM | POA: Insufficient documentation

## 2015-06-07 DIAGNOSIS — R05 Cough: Secondary | ICD-10-CM | POA: Diagnosis not present

## 2015-06-07 DIAGNOSIS — J9611 Chronic respiratory failure with hypoxia: Secondary | ICD-10-CM | POA: Diagnosis not present

## 2015-06-07 DIAGNOSIS — J449 Chronic obstructive pulmonary disease, unspecified: Secondary | ICD-10-CM | POA: Diagnosis not present

## 2015-06-07 HISTORY — DX: Sepsis, unspecified organism: A41.9

## 2015-06-07 LAB — COMPREHENSIVE METABOLIC PANEL
ALBUMIN: 3.6 g/dL (ref 3.5–5.0)
ALK PHOS: 57 U/L (ref 38–126)
ALT: 20 U/L (ref 17–63)
ALT: 23 U/L (ref 17–63)
AST: 28 U/L (ref 15–41)
AST: 34 U/L (ref 15–41)
Albumin: 3.5 g/dL (ref 3.5–5.0)
Alkaline Phosphatase: 50 U/L (ref 38–126)
Anion gap: 8 (ref 5–15)
Anion gap: 8 (ref 5–15)
BILIRUBIN TOTAL: 1.2 mg/dL (ref 0.3–1.2)
BILIRUBIN TOTAL: 1 mg/dL (ref 0.3–1.2)
BUN: 26 mg/dL — AB (ref 6–20)
BUN: 24 mg/dL — AB (ref 6–20)
CALCIUM: 8.8 mg/dL — AB (ref 8.9–10.3)
CO2: 28 mmol/L (ref 22–32)
CO2: 26 mmol/L (ref 22–32)
CREATININE: 1.03 mg/dL (ref 0.61–1.24)
CREATININE: 0.96 mg/dL (ref 0.61–1.24)
Calcium: 7.9 mg/dL — ABNORMAL LOW (ref 8.9–10.3)
Chloride: 102 mmol/L (ref 101–111)
Chloride: 105 mmol/L (ref 101–111)
GFR calc Af Amer: >60 mL/min (ref >60)
GFR calc non Af Amer: >60 mL/min (ref >60)
GLUCOSE: 189 mg/dL — AB (ref 65–99)
Glucose, Bld: 184 mg/dL — ABNORMAL HIGH (ref 65–99)
POTASSIUM: 5 mmol/L (ref 3.5–5.1)
Potassium: 4.6 mmol/L (ref 3.5–5.1)
SODIUM: 138 mmol/L (ref 135–145)
Sodium: 139 mmol/L (ref 135–145)
TOTAL PROTEIN: 6.9 g/dL (ref 6.5–8.1)
TOTAL PROTEIN: 6.7 g/dL (ref 6.5–8.1)

## 2015-06-07 LAB — CBC WITH DIFFERENTIAL/PLATELET
BASOS ABS: 0 10*3/uL (ref 0.0–0.1)
BASOS PCT: 0 %
Eosinophils Absolute: 0 10*3/uL (ref 0.0–0.7)
Eosinophils Relative: 0 %
HEMATOCRIT: 44.7 % (ref 39.0–52.0)
Hemoglobin: 14.8 g/dL (ref 13.0–17.0)
LYMPHS PCT: 6 %
Lymphs Abs: 0.6 10*3/uL — ABNORMAL LOW (ref 0.7–4.0)
MCH: 31.4 pg (ref 26.0–34.0)
MCHC: 33.1 g/dL (ref 30.0–36.0)
MCV: 94.9 fL (ref 78.0–100.0)
MONO ABS: 0.5 10*3/uL (ref 0.1–1.0)
Monocytes Relative: 5 %
NEUTROS ABS: 8.7 10*3/uL — AB (ref 1.7–7.7)
Neutrophils Relative %: 89 %
Platelets: 148 10*3/uL — ABNORMAL LOW (ref 150–400)
RBC: 4.71 MIL/uL (ref 4.22–5.81)
RDW: 13 % (ref 11.5–15.5)
WBC: 9.8 10*3/uL (ref 4.0–10.5)

## 2015-06-07 LAB — URINALYSIS, ROUTINE W REFLEX MICROSCOPIC
BILIRUBIN URINE: NEGATIVE
GLUCOSE, UA: NEGATIVE mg/dL
Ketones, ur: NEGATIVE mg/dL
LEUKOCYTES UA: NEGATIVE
NITRITE: NEGATIVE
PH: 5 (ref 5.0–8.0)
Protein, ur: 30 mg/dL — AB
SPECIFIC GRAVITY, URINE: 1.025 (ref 1.005–1.030)

## 2015-06-07 LAB — GLUCOSE, CAPILLARY
GLUCOSE-CAPILLARY: 189 mg/dL — AB (ref 65–99)
GLUCOSE-CAPILLARY: 375 mg/dL — AB (ref 65–99)
GLUCOSE-CAPILLARY: 477 mg/dL — AB (ref 65–99)
Glucose-Capillary: 414 mg/dL — ABNORMAL HIGH (ref 65–99)
Glucose-Capillary: 454 mg/dL — ABNORMAL HIGH (ref 65–99)

## 2015-06-07 LAB — CBC
HEMATOCRIT: 43.6 % (ref 39.0–52.0)
Hemoglobin: 14.2 g/dL (ref 13.0–17.0)
MCH: 31.3 pg (ref 26.0–34.0)
MCHC: 32.6 g/dL (ref 30.0–36.0)
MCV: 96 fL (ref 78.0–100.0)
Platelets: 141 10*3/uL — ABNORMAL LOW (ref 150–400)
RBC: 4.54 MIL/uL (ref 4.22–5.81)
RDW: 13.3 % (ref 11.5–15.5)
WBC: 11.4 10*3/uL — AB (ref 4.0–10.5)

## 2015-06-07 LAB — I-STAT CG4 LACTIC ACID, ED: Lactic Acid, Venous: 2.25 mmol/L (ref 0.5–2.0)

## 2015-06-07 LAB — BRAIN NATRIURETIC PEPTIDE: B NATRIURETIC PEPTIDE 5: 206 pg/mL — AB (ref 0.0–100.0)

## 2015-06-07 LAB — URINE MICROSCOPIC-ADD ON

## 2015-06-07 LAB — PROTIME-INR
INR: 1.99 — AB (ref 0.00–1.49)
Prothrombin Time: 22.5 seconds — ABNORMAL HIGH (ref 11.6–15.2)

## 2015-06-07 MED ORDER — INSULIN NPH (HUMAN) (ISOPHANE) 100 UNIT/ML ~~LOC~~ SUSP
40.0000 [IU] | Freq: Two times a day (BID) | SUBCUTANEOUS | Status: DC
Start: 2015-06-07 — End: 2015-06-09
  Administered 2015-06-07 – 2015-06-09 (×4): 40 [IU] via SUBCUTANEOUS
  Filled 2015-06-07: qty 10

## 2015-06-07 MED ORDER — DEXTROSE 5 % IV SOLN
500.0000 mg | INTRAVENOUS | Status: DC
  Administered 2015-06-08: 500 mg via INTRAVENOUS
  Filled 2015-06-07 (×2): qty 500

## 2015-06-07 MED ORDER — INSULIN ASPART 100 UNIT/ML ~~LOC~~ SOLN
0.0000 [IU] | Freq: Three times a day (TID) | SUBCUTANEOUS | Status: DC
  Administered 2015-06-07: 9 [IU] via SUBCUTANEOUS
  Administered 2015-06-07: 2 [IU] via SUBCUTANEOUS

## 2015-06-07 MED ORDER — ACETAMINOPHEN 325 MG PO TABS
650.0000 mg | ORAL_TABLET | Freq: Once | ORAL | Status: AC
  Administered 2015-06-07: 650 mg via ORAL
  Filled 2015-06-07: qty 2

## 2015-06-07 MED ORDER — IPRATROPIUM BROMIDE 0.02 % IN SOLN
0.5000 mg | Freq: Four times a day (QID) | RESPIRATORY_TRACT | Status: DC
  Administered 2015-06-07 – 2015-06-09 (×8): 0.5 mg via RESPIRATORY_TRACT
  Filled 2015-06-07 (×8): qty 2.5

## 2015-06-07 MED ORDER — INSULIN ASPART 100 UNIT/ML ~~LOC~~ SOLN
0.0000 [IU] | Freq: Three times a day (TID) | SUBCUTANEOUS | Status: DC
  Administered 2015-06-07 – 2015-06-08 (×2): 20 [IU] via SUBCUTANEOUS
  Administered 2015-06-08: 15 [IU] via SUBCUTANEOUS
  Administered 2015-06-09: 20 [IU] via SUBCUTANEOUS
  Administered 2015-06-09: 7 [IU] via SUBCUTANEOUS

## 2015-06-07 MED ORDER — METOPROLOL TARTRATE 25 MG PO TABS
12.5000 mg | ORAL_TABLET | Freq: Two times a day (BID) | ORAL | Status: DC
  Administered 2015-06-07: 12.5 mg via ORAL
  Filled 2015-06-07: qty 1

## 2015-06-07 MED ORDER — DEXTROSE 5 % IV SOLN
1.0000 g | Freq: Once | INTRAVENOUS | Status: AC
  Administered 2015-06-07: 1 g via INTRAVENOUS
  Filled 2015-06-07: qty 10

## 2015-06-07 MED ORDER — METOPROLOL TARTRATE 1 MG/ML IV SOLN
5.0000 mg | Freq: Once | INTRAVENOUS | Status: AC
  Administered 2015-06-07: 5 mg via INTRAVENOUS
  Filled 2015-06-07: qty 5

## 2015-06-07 MED ORDER — ACETAMINOPHEN 650 MG RE SUPP: 650.0000 mg | Freq: Four times a day (QID) | RECTAL | Status: DC | PRN

## 2015-06-07 MED ORDER — SODIUM CHLORIDE 0.9 % IV BOLUS (SEPSIS)
1000.0000 mL | INTRAVENOUS | Status: AC
  Administered 2015-06-07 (×2): 1000 mL via INTRAVENOUS

## 2015-06-07 MED ORDER — METOPROLOL TARTRATE 1 MG/ML IV SOLN: 5.0000 mg | Freq: Once | INTRAVENOUS | Status: DC

## 2015-06-07 MED ORDER — BUDESONIDE 0.25 MG/2ML IN SUSP
0.2500 mg | Freq: Two times a day (BID) | RESPIRATORY_TRACT | Status: DC
  Administered 2015-06-07 – 2015-06-09 (×4): 0.25 mg via RESPIRATORY_TRACT
  Filled 2015-06-07 (×4): qty 2

## 2015-06-07 MED ORDER — SODIUM CHLORIDE 0.9 % IV BOLUS (SEPSIS)
500.0000 mL | INTRAVENOUS | Status: AC
  Administered 2015-06-07: 500 mL via INTRAVENOUS

## 2015-06-07 MED ORDER — ONDANSETRON HCL 4 MG/2ML IJ SOLN: 4.0000 mg | Freq: Four times a day (QID) | INTRAMUSCULAR | Status: DC | PRN

## 2015-06-07 MED ORDER — DEXTROSE 5 % IV SOLN
500.0000 mg | Freq: Once | INTRAVENOUS | Status: AC
  Administered 2015-06-07: 500 mg via INTRAVENOUS
  Filled 2015-06-07: qty 500

## 2015-06-07 MED ORDER — INSULIN NPH (HUMAN) (ISOPHANE) 100 UNIT/ML ~~LOC~~ SUSP
35.0000 [IU] | Freq: Two times a day (BID) | SUBCUTANEOUS | Status: DC
Start: 2015-06-07 — End: 2015-06-07
  Administered 2015-06-07: 35 [IU] via SUBCUTANEOUS
  Filled 2015-06-07: qty 10

## 2015-06-07 MED ORDER — ALBUTEROL SULFATE (2.5 MG/3ML) 0.083% IN NEBU
INHALATION_SOLUTION | RESPIRATORY_TRACT | Status: AC
  Administered 2015-06-07: 2.5 mg
  Filled 2015-06-07: qty 6

## 2015-06-07 MED ORDER — WARFARIN SODIUM 5 MG PO TABS
5.0000 mg | ORAL_TABLET | Freq: Once | ORAL | Status: AC
  Administered 2015-06-07: 5 mg via ORAL
  Filled 2015-06-07: qty 1

## 2015-06-07 MED ORDER — GUAIFENESIN ER 600 MG PO TB12
1200.0000 mg | ORAL_TABLET | Freq: Two times a day (BID) | ORAL | Status: DC
  Administered 2015-06-07 – 2015-06-09 (×5): 1200 mg via ORAL
  Filled 2015-06-07 (×5): qty 2

## 2015-06-07 MED ORDER — IPRATROPIUM BROMIDE 0.02 % IN SOLN
RESPIRATORY_TRACT | Status: AC
  Administered 2015-06-07: 0.5 mg
  Filled 2015-06-07: qty 2.5

## 2015-06-07 MED ORDER — METOPROLOL TARTRATE 50 MG PO TABS
50.0000 mg | ORAL_TABLET | Freq: Two times a day (BID) | ORAL | Status: DC
  Administered 2015-06-07 – 2015-06-09 (×4): 50 mg via ORAL
  Filled 2015-06-07 (×4): qty 1

## 2015-06-07 MED ORDER — LEVALBUTEROL HCL 0.63 MG/3ML IN NEBU
0.6300 mg | INHALATION_SOLUTION | Freq: Four times a day (QID) | RESPIRATORY_TRACT | Status: DC
  Administered 2015-06-07 – 2015-06-09 (×8): 0.63 mg via RESPIRATORY_TRACT
  Filled 2015-06-07 (×8): qty 3

## 2015-06-07 MED ORDER — ACETAMINOPHEN 325 MG PO TABS: 650.0000 mg | ORAL_TABLET | Freq: Four times a day (QID) | ORAL | Status: DC | PRN

## 2015-06-07 MED ORDER — ACETAMINOPHEN 500 MG PO TABS: 1000.0000 mg | ORAL_TABLET | Freq: Four times a day (QID) | ORAL | Status: DC | PRN

## 2015-06-07 MED ORDER — DEXTROSE 5 % IV SOLN
1.0000 g | INTRAVENOUS | Status: DC
  Administered 2015-06-08: 1 g via INTRAVENOUS
  Filled 2015-06-07 (×2): qty 10

## 2015-06-07 MED ORDER — METHYLPREDNISOLONE SODIUM SUCC 125 MG IJ SOLR
60.0000 mg | Freq: Four times a day (QID) | INTRAMUSCULAR | Status: DC
  Administered 2015-06-07 – 2015-06-08 (×5): 60 mg via INTRAVENOUS
  Filled 2015-06-07 (×5): qty 2

## 2015-06-07 MED ORDER — SODIUM CHLORIDE 0.9 % IV SOLN
INTRAVENOUS | Status: DC
  Administered 2015-06-07: 05:00:00 via INTRAVENOUS

## 2015-06-07 MED ORDER — WARFARIN - PHARMACIST DOSING INPATIENT
Freq: Every day | Status: DC
  Administered 2015-06-07 – 2015-06-08 (×2)

## 2015-06-07 MED ORDER — ONDANSETRON HCL 4 MG PO TABS: 4.0000 mg | ORAL_TABLET | Freq: Four times a day (QID) | ORAL | Status: DC | PRN

## 2015-06-07 NOTE — ED Provider Notes (Signed)
CSN: [id number]     Arrival date & time 06/07/15  0042 History   First MD Initiated Contact with Patient 06/07/15 0103     Chief Complaint  Patient presents with  . Shortness of Breath     (Consider location/radiation/quality/duration/timing/severity/associated sxs/prior Treatment) HPI  65 year old male with acute dyspnea. Has been feeling poor over the last 2 days but significant worse tonight. Has been having a nonproductive cough and a subjective fever. No significant minute rhinorrhea or muscle aches. No headaches. No chest pain, vomiting, or abdominal pain. Feels like prior COPD exacerbations. Patient tried one albuterol treatment with no significant relief. Typically uses about 3 L oxygen all the time.   Past Medical History  Diagnosis Date  . Diabetes mellitus   . Asthma   . Hyperlipidemia   . Obesity     exogenous  . BPH (benign prostatic hypertrophy)   . Colon polyps   . Shingles   . GERD (gastroesophageal reflux disease)   . Arthritis   . Asthma   . HTN (hypertension)     x 3 years  . Lung nodules 03/11/2013  . Mediastinal adenopathy 06/20/2013    CT  & PET 2/15   . Cancer ([place])   . NHL (non-Hodgkin's lymphoma) ([place])     nhl dx 9/11  . History of oxygen administration     occ. oxygen use at 2 l/m as needed- not using at this time  . Sleep apnea     recent study 04-07-14 awaiting results, no cpap yet.-Dr. Glynn Octave  . COPD (chronic obstructive pulmonary disease) [facility])    Past Surgical History  Procedure Laterality Date  . Appendectomy    . Colonoscopy with propofol N/A 05/05/2014    Procedure: COLONOSCOPY WITH PROPOFOL;  Surgeon: Inda Castle, MD;  Location: [facility] ENDOSCOPY;  Service: Endoscopy;  Laterality: N/A;   Family History  Problem Relation Age of Onset  . Liver cancer Mother   . Colon cancer Father   . Prostate cancer Father   . Colon polyps Father   . Pulmonary embolism Sister   . Heart attack Sister   . Aortic aneurysm Sister   . COPD Sister   .  Heart disease Sister   . COPD Sister   . Heart disease Sister   . Diabetes Maternal Grandmother    Social History  Substance Use Topics  . Smoking status: Former Smoker -- 2.00 packs/day    Start date: 12/17/1968    Quit date: 11/14/2004  . Smokeless tobacco: Never Used     Comment: 2 ppd   . Alcohol Use: No     Comment: previous    Review of Systems  Constitutional: Positive for fever.  Respiratory: Positive for cough and shortness of breath.   Cardiovascular: Negative for chest pain and leg swelling.  Gastrointestinal: Negative for vomiting and abdominal pain.  All other systems reviewed and are negative.     Allergies  Avapro and Lipitor  Home Medications   Prior to Admission medications   Medication Sig Start Date End Date Taking? Authorizing Provider  acetaminophen (TYLENOL) 500 MG tablet Take 1,000 mg by mouth every 6 (six) hours as needed for headache. Reported on 04/15/2015    Historical Provider, MD  albuterol (PROAIR HFA) 108 (90 BASE) MCG/ACT inhaler Inhale 2 puffs into the lungs every 6 (six) hours as needed for wheezing or shortness of breath. 12/26/14 02/16/16  Deneise Lever, MD  ALPRAZolam (XANAX) 0.25 MG tablet TAKE ONE TABLET BY  MOUTH AT BEDTIME 05/01/15   Wardell Honour, MD  Blood Glucose Calibration (TAI [facility] CONTROL) NORMAL SOLN Use as directed. 09/10/13   Historical Provider, MD  Blood Glucose Monitoring Suppl (CLEVER CHEK AUTO-CODE VOICE) DEVI USE AS DIRECTED 11/13/12   Chipper Herb, MD  budesonide-formoterol [facility]) 160-4.5 MCG/ACT inhaler Inhale 2 puffs into the lungs 2 (two) times daily. 02/08/14   Chipper Herb, MD  Cholecalciferol (VITAMIN D3) 5000 UNITS TABS Take 1 tablet by mouth every morning.    Historical Provider, MD  enalapril (VASOTEC) 20 MG tablet Take 1 tablet (20 mg total) by mouth daily. 11/10/14   Chipper Herb, MD  enalapril (VASOTEC) 20 MG tablet TAKE ONE TABLET BY MOUTH ONCE DAILY 05/28/15   Chipper Herb, MD  furosemide (LASIX)  20 MG tablet Take 20 mg by mouth daily. 02/03/15   Historical Provider, MD  insulin NPH Human (NOVOLIN N RELION) 100 UNIT/ML injection Inject 0.35 mLs (35 Units total) into the skin 2 (two) times daily before a meal. 03/16/15   Tammy Eckard, PHARMD  ipratropium-albuterol (DUONEB) 0.5-2.5 (3) MG/3ML SOLN PLACE 1 VIAL (3 MLS) IN THE NEBULIZER EVERY 6 HOURS AS NEEDED 11/10/14   Chipper Herb, MD  Lancet Devices (LANCING DEVICE) MISC USE AS DIRECTED 11/13/12   Chipper Herb, MD  metFORMIN (GLUCOPHAGE) 500 MG tablet TAKE ONE TABLET BY MOUTH IN THE MORNING AND TWO IN THE EVENING 04/28/15   Chipper Herb, MD  metoprolol (LOPRESSOR) 100 MG tablet TAKE ONE TABLET BY MOUTH TWICE DAILY 04/29/15   Chipper Herb, MD  NOVOLIN R RELION 100 UNIT/ML injection Inject 32 Units into the skin 2 (two) times daily before a meal. 03/30/15   Historical Provider, MD  NOVOLIN R RELION 100 UNIT/ML injection INJECT 32 UNITS SUBCUTANEOUSLY TWICE DAILY BEFORE A MEAL 05/29/15   Chipper Herb, MD  promethazine-codeine (PHENERGAN WITH CODEINE) 6.25-10 MG/5ML syrup Take 5 mLs by mouth every 6 (six) hours as needed for cough. 01/21/15   Deneise Lever, MD  simvastatin (ZOCOR) 40 MG tablet Take 1 tablet (40 mg total) by mouth daily. 11/10/14   Chipper Herb, MD  warfarin (COUMADIN) 5 MG tablet TAKE ONE & ONE-HALF TABS BY PO QD ON  MONDAY AND  FRIDAY & THEN TAKE 1 TAB THE REST OF THE WEEK 11/10/14   Chipper Herb, MD   BP 127/74 mmHg  Pulse 125  Temp(Src) 102.8 F (39.3 C) (Oral)  Resp 24  SpO2 95% Physical Exam  Constitutional: He is oriented to person, place, and time. He appears well-developed and well-nourished.  obese  HENT:  Head: Normocephalic and atraumatic.  Right Ear: External ear normal.  Left Ear: External ear normal.  Nose: Nose normal.  Eyes: Right eye exhibits no discharge. Left eye exhibits no discharge.  Neck: Neck supple.  Cardiovascular: Normal heart sounds and intact distal pulses.  An irregularly irregular  rhythm present. Tachycardia present.   Pulmonary/Chest: Effort normal. Tachypnea noted. He has wheezes. He has rhonchi.  Abdominal: Soft. There is no tenderness.  Musculoskeletal: He exhibits edema (trace pedal edema bilaterally).  Neurological: He is alert and oriented to person, place, and time.  Skin: Skin is warm and dry.  Nursing note and vitals reviewed.   ED Course  Procedures (including critical care time) Labs Review Labs Reviewed  COMPREHENSIVE METABOLIC PANEL - Abnormal; Notable for the following:    Glucose, Bld 189 (*)    BUN 26 (*)  Calcium 8.8 (*)    All other components within normal limits  CBC WITH DIFFERENTIAL/PLATELET - Abnormal; Notable for the following:    Platelets 148 (*)    Neutro Abs 8.7 (*)    Lymphs Abs 0.6 (*)    All other components within normal limits  URINALYSIS, ROUTINE W REFLEX MICROSCOPIC (NOT AT [facility]) - Abnormal; Notable for the following:    Hgb urine dipstick TRACE (*)    Protein, ur 30 (*)    All other components within normal limits  PROTIME-INR - Abnormal; Notable for the following:    Prothrombin Time 22.5 (*)    INR 1.99 (*)    All other components within normal limits  BRAIN NATRIURETIC PEPTIDE - Abnormal; Notable for the following:    B Natriuretic Peptide 206.0 (*)    All other components within normal limits  URINE MICROSCOPIC-ADD ON - Abnormal; Notable for the following:    Squamous Epithelial / LPF 0-5 (*)    Bacteria, UA FEW (*)    All other components within normal limits  I-STAT CG4 LACTIC ACID, ED - Abnormal; Notable for the following:    Lactic Acid, Venous 2.25 (*)    All other components within normal limits  CULTURE, BLOOD (ROUTINE X 2)  CULTURE, BLOOD (ROUTINE X 2)  URINE CULTURE  CBC  COMPREHENSIVE METABOLIC PANEL  HEMOGLOBIN A1C  I-STAT CG4 LACTIC ACID, ED    Imaging Review Dg Chest Port 1 View  06/07/2015  CLINICAL DATA:  Acute onset of shortness of breath, cough and chills. Decreased O2 saturation.  Initial encounter. EXAM: PORTABLE CHEST 1 VIEW COMPARISON:  CT of the chest performed 04/09/2015 FINDINGS: Vascular congestion is noted. Increased interstitial markings may reflect mild pulmonary edema. No pleural effusion or pneumothorax is seen. Known tiny pulmonary nodules are not well characterized on radiograph. The cardiomediastinal silhouette is borderline normal in size. No acute osseous abnormalities are seen. IMPRESSION: Vascular congestion noted. Increased interstitial markings may reflect mild pulmonary edema. Electronically Signed   By: Garald Balding M.D.   On: 06/07/2015 02:01   I have personally reviewed and evaluated these images and lab results as part of my medical decision-making.   EKG Interpretation   Date/Time:  Sunday June 07 2015 00:54:14 EST Ventricular Rate:  136 PR Interval:    QRS Duration: 58 QT Interval:  323 QTC Calculation: 486 R Axis:   97 Text Interpretation:  Atrial fibrillation with RVR Right axis deviation  Low voltage, precordial leads Borderline T abnormalities, diffuse leads  Borderline prolonged QT interval Confirmed by Christapher Gillian  MD, Myrl Bynum ([id number])  on 06/07/2015 1:02:03 AM      MDM   Final diagnoses:  COPD exacerbation (HCC)  Sepsis, due to unspecified organism [facility])    Patient appears significantly improved after albuterol treatment and IV fluids. Breathing easier. No current hypotension. X-ray is difficult to read due to poor inspiration and poor penetration. However I'm concerned about a pneumonia and thus he has been given IV antibiotics. Treated per sepsis protocol. He feels better but has significant morbidity is an elevated lactic acid. We'll admit to the hospitalist for further care and treatment.    Sherwood Gambler, MD 06/07/15 [phone]

## 2015-06-07 NOTE — H&P (Signed)
PCP:   Redge Gainer, MD   Chief Complaint:  Shortness of breath  HPI:  65 year old male who    has a past medical history of Diabetes mellitus; Asthma; Hyperlipidemia; Obesity; BPH (benign prostatic hypertrophy); Colon polyps; Shingles; GERD (gastroesophageal reflux disease); Arthritis; Asthma; HTN (hypertension); Lung nodules (03/11/2013); Mediastinal adenopathy (06/20/2013); Cancer ([place]); NHL (non-Hodgkin's lymphoma) ([place]); History of oxygen administration; Sleep apnea; and COPD (chronic obstructive pulmonary disease) ([place]). Today presents to the hospital with complaints of shortness of breath which started this morning. Patient has a history of COPD and is on home oxygen. He has been coughing more lately. Denies coughing up any phlegm. No chest pain. No nausea vomiting or diarrhea. In the ED patient was found to have fever 102.8. Empirically started on Rocephin and Zithromax for possible pneumonia. Chest x-ray showed interstitial edema. White count is normal. Patient had mild elevation of lactate 2.25.  Allergies:   Allergies  Allergen Reactions  . Avapro [Irbesartan] Other (See Comments)    "doesnt sit right with me"--light headed  . Lipitor [Atorvastatin Calcium] Other (See Comments)    Leg pain      Past Medical History  Diagnosis Date  . Diabetes mellitus   . Asthma   . Hyperlipidemia   . Obesity     exogenous  . BPH (benign prostatic hypertrophy)   . Colon polyps   . Shingles   . GERD (gastroesophageal reflux disease)   . Arthritis   . Asthma   . HTN (hypertension)     x 3 years  . Lung nodules 03/11/2013  . Mediastinal adenopathy 06/20/2013    CT  & PET 2/15   . Cancer ([place])   . NHL (non-Hodgkin's lymphoma) ([place])     nhl dx 9/11  . History of oxygen administration     occ. oxygen use at 2 l/m as needed- not using at this time  . Sleep apnea     recent study 04-07-14 awaiting results, no cpap yet.-Dr. Glynn Octave  . COPD (chronic obstructive pulmonary disease)  [facility])     Past Surgical History  Procedure Laterality Date  . Appendectomy    . Colonoscopy with propofol N/A 05/05/2014    Procedure: COLONOSCOPY WITH PROPOFOL;  Surgeon: Inda Castle, MD;  Location: [facility] ENDOSCOPY;  Service: Endoscopy;  Laterality: N/A;    Prior to Admission medications   Medication Sig Start Date End Date Taking? Authorizing Provider  acetaminophen (TYLENOL) 500 MG tablet Take 1,000 mg by mouth every 6 (six) hours as needed for headache. Reported on 04/15/2015    Historical Provider, MD  albuterol (PROAIR HFA) 108 (90 BASE) MCG/ACT inhaler Inhale 2 puffs into the lungs every 6 (six) hours as needed for wheezing or shortness of breath. 12/26/14 02/16/16  Deneise Lever, MD  ALPRAZolam Duanne Moron) 0.25 MG tablet TAKE ONE TABLET BY MOUTH AT BEDTIME 05/01/15   Wardell Honour, MD  Blood Glucose Calibration (TAI [facility] CONTROL) NORMAL SOLN Use as directed. 09/10/13   Historical Provider, MD  Blood Glucose Monitoring Suppl (CLEVER CHEK AUTO-CODE VOICE) DEVI USE AS DIRECTED 11/13/12   Chipper Herb, MD  budesonide-formoterol [facility]) 160-4.5 MCG/ACT inhaler Inhale 2 puffs into the lungs 2 (two) times daily. 02/08/14   Chipper Herb, MD  Cholecalciferol (VITAMIN D3) 5000 UNITS TABS Take 1 tablet by mouth every morning.    Historical Provider, MD  enalapril (VASOTEC) 20 MG tablet Take 1 tablet (20 mg total) by mouth daily. 11/10/14   Estella Husk  Laurance Flatten, MD  enalapril (VASOTEC) 20 MG tablet TAKE ONE TABLET BY MOUTH ONCE DAILY 05/28/15   Chipper Herb, MD  furosemide (LASIX) 20 MG tablet Take 20 mg by mouth daily. 02/03/15   Historical Provider, MD  insulin NPH Human (NOVOLIN N RELION) 100 UNIT/ML injection Inject 0.35 mLs (35 Units total) into the skin 2 (two) times daily before a meal. 03/16/15   Tammy Eckard, PHARMD  ipratropium-albuterol (DUONEB) 0.5-2.5 (3) MG/3ML SOLN PLACE 1 VIAL (3 MLS) IN THE NEBULIZER EVERY 6 HOURS AS NEEDED 11/10/14   Chipper Herb, MD  Lancet Devices (LANCING DEVICE)  MISC USE AS DIRECTED 11/13/12   Chipper Herb, MD  metFORMIN (GLUCOPHAGE) 500 MG tablet TAKE ONE TABLET BY MOUTH IN THE MORNING AND TWO IN THE EVENING 04/28/15   Chipper Herb, MD  metoprolol (LOPRESSOR) 100 MG tablet TAKE ONE TABLET BY MOUTH TWICE DAILY 04/29/15   Chipper Herb, MD  NOVOLIN R RELION 100 UNIT/ML injection Inject 32 Units into the skin 2 (two) times daily before a meal. 03/30/15   Historical Provider, MD  NOVOLIN R RELION 100 UNIT/ML injection INJECT 32 UNITS SUBCUTANEOUSLY TWICE DAILY BEFORE A MEAL 05/29/15   Chipper Herb, MD  promethazine-codeine (PHENERGAN WITH CODEINE) 6.25-10 MG/5ML syrup Take 5 mLs by mouth every 6 (six) hours as needed for cough. 01/21/15   Deneise Lever, MD  simvastatin (ZOCOR) 40 MG tablet Take 1 tablet (40 mg total) by mouth daily. 11/10/14   Chipper Herb, MD  warfarin (COUMADIN) 5 MG tablet TAKE ONE & ONE-HALF TABS BY PO QD ON  MONDAY AND  FRIDAY & THEN TAKE 1 TAB THE REST OF THE WEEK 11/10/14   Chipper Herb, MD    Social History:  reports that he quit smoking about 10 years ago. He started smoking about 46 years ago. He has never used smokeless tobacco. He reports that he does not drink alcohol or use illicit drugs.  Family History  Problem Relation Age of Onset  . Liver cancer Mother   . Colon cancer Father   . Prostate cancer Father   . Colon polyps Father   . Pulmonary embolism Sister   . Heart attack Sister   . Aortic aneurysm Sister   . COPD Sister   . Heart disease Sister   . COPD Sister   . Heart disease Sister   . Diabetes Maternal Grandmother      All the positives are listed in BOLD  Review of Systems:  HEENT: Headache, blurred vision, runny nose, sore throat Neck: Hypothyroidism, hyperthyroidism,,lymphadenopathy Chest : Shortness of breath, history of COPD, Asthma Heart : Chest pain, history of coronary arterey disease GI:  Nausea, vomiting, diarrhea, constipation, GERD GU: Dysuria, urgency, frequency of urination,  hematuria Neuro: Stroke, seizures, syncope Psych: Depression, anxiety, hallucinations   Physical Exam: Blood pressure 95/65, pulse 132, temperature 102.8 F (39.3 C), temperature source Oral, resp. rate 25, SpO2 92 %. Constitutional:   Patient is a well-developed and well-nourished male* in no acute distress and cooperative with exam. Head: Normocephalic and atraumatic Mouth: Mucus membranes moist Eyes: PERRL, EOMI, conjunctivae normal Neck: Supple, No Thyromegaly Cardiovascular: RRR, S1 normal, S2 normal Pulmonary/Chest: bilateral rhonchi  Abdominal: Soft. Non-tender, non-distended, bowel sounds are normal, no masses, organomegaly, or guarding present.  Neurological: A&O x3, Strength is normal and symmetric bilaterally, cranial nerve II-XII are grossly intact, no focal motor deficit, sensory intact to light touch bilaterally.  Extremities : No Cyanosis, Clubbing. Trace  edema of the lower extremities   Labs on Admission:  Basic Metabolic Panel:  Recent Labs Lab 06/07/15 0115  NA 138  K 4.6  CL 102  CO2 28  GLUCOSE 189*  BUN 26*  CREATININE 1.03  CALCIUM 8.8*   Liver Function Tests:  Recent Labs Lab 06/07/15 0115  AST 28  ALT 20  ALKPHOS 57  BILITOT 1.2  PROT 6.9  ALBUMIN 3.6   CBC:  Recent Labs Lab 06/07/15 0115  WBC 9.8  NEUTROABS 8.7*  HGB 14.8  HCT 44.7  MCV 94.9  PLT 148*    Radiological Exams on Admission: Dg Chest Port 1 View  06/07/2015  CLINICAL DATA:  Acute onset of shortness of breath, cough and chills. Decreased O2 saturation. Initial encounter. EXAM: PORTABLE CHEST 1 VIEW COMPARISON:  CT of the chest performed 04/09/2015 FINDINGS: Vascular congestion is noted. Increased interstitial markings may reflect mild pulmonary edema. No pleural effusion or pneumothorax is seen. Known tiny pulmonary nodules are not well characterized on radiograph. The cardiomediastinal silhouette is borderline normal in size. No acute osseous abnormalities are seen.  IMPRESSION: Vascular congestion noted. Increased interstitial markings may reflect mild pulmonary edema. Electronically Signed   By: Garald Balding M.D.   On: 06/07/2015 02:01    EKG: Independently reviewed. Atrial fibrillation with rapid response   Assessment/Plan Active Problems:   NHL (non-Hodgkin's lymphoma) (HCC)   Diabetes mellitus type 2, insulin dependent (HCC)   COPD exacerbation (HCC)   FIBRILLATION, ATRIAL   COPD exacerbation Admit the patient, start Xopenex nebulizer every 6 hours, ipratropium nebulizer every 6 hours Solu-Medrol 60 mg IV every 6 hours Mucinex one tablet by mouth twice a day  ? Pneumonia Patient presented with mild elevation of lactate, fever. Empirically started on Rocephin and Zithromax Follow blood cultures  Atrial fibrillation with RVR Patient takes metoprolol at home, currently on hold due to hypotension Once blood pressure stabilizes consider restarting metoprolol Patient on Coumadin, we will get pharmacy consultation to manage Coumadin.  Diabetes mellitus Start sliding scale insulin with NovoLog   Code status: Full code  Family discussion: Admission, patients condition and plan of care including tests being ordered have been discussed with the patient and his wife at bedside who indicate understanding and agree with the plan and Code Status.   Time Spent on Admission: 60 min  [place] [facility] Pager: [phone] 06/07/2015, 3:46 AM  If 7PM-7AM, please contact night-coverage  [email or web address]  Password [email or web address]

## 2015-06-07 NOTE — Progress Notes (Signed)
ANTICOAGULATION CONSULT NOTE - Initial Consult  Pharmacy Consult for Coumadin Indication: atrial fibrillation  Allergies  Allergen Reactions  . Avapro [Irbesartan] Other (See Comments)    "doesnt sit right with me"--light headed  . Lipitor [Atorvastatin Calcium] Other (See Comments)    Leg pain    Patient Measurements: Height: '5\' 9"'  (175.3 cm) Weight: (!) 335 lb 14.4 oz (152.363 kg) IBW/kg (Calculated) : 70.7  Vital Signs: Temp: 100 F (37.8 C) (02/12 0414) Temp Source: Oral (02/12 0414) BP: 100/46 mmHg (02/12 0414) Pulse Rate: 88 (02/12 0414)  Labs:  Recent Labs  06/07/15 0115  HGB 14.8  HCT 44.7  PLT 148*  LABPROT 22.5*  INR 1.99*  CREATININE 1.03    Estimated Creatinine Clearance: 106 mL/min (by C-G formula based on Cr of 1.03).   Medical History: Past Medical History  Diagnosis Date  . Diabetes mellitus   . Asthma   . Hyperlipidemia   . Obesity     exogenous  . BPH (benign prostatic hypertrophy)   . Colon polyps   . Shingles   . GERD (gastroesophageal reflux disease)   . Arthritis   . Asthma   . HTN (hypertension)     x 3 years  . Lung nodules 03/11/2013  . Mediastinal adenopathy 06/20/2013    CT  & PET 2/15   . Cancer ([place])   . NHL (non-Hodgkin's lymphoma) ([place])     nhl dx 9/11  . History of oxygen administration     occ. oxygen use at 2 l/m as needed- not using at this time  . Sleep apnea     recent study 04-07-14 awaiting results, no cpap yet.-Dr. Glynn Octave  . COPD (chronic obstructive pulmonary disease) (H[facility])     Medications:  Prescriptions prior to admission  Medication Sig Dispense Refill Last Dose  . acetaminophen (TYLENOL) 500 MG tablet Take 1,000 mg by mouth every 6 (six) hours as needed for headache. Reported on 04/15/2015   Taking  . albuterol (PROAIR HFA) 108 (90 BASE) MCG/ACT inhaler Inhale 2 puffs into the lungs every 6 (six) hours as needed for wheezing or shortness of breath. 8.5 g 5 Taking  . ALPRAZolam (XANAX) 0.25 MG  tablet TAKE ONE TABLET BY MOUTH AT BEDTIME 30 tablet 0 Taking  . Blood Glucose Calibration (TAI DOC CONTROL) NORMAL SOLN Use as directed.   Taking  . Blood Glucose Monitoring Suppl (CLEVER CHEK AUTO-CODE VOICE) DEVI USE AS DIRECTED 1 each 2 Taking  . budesonide-formoterol (SYMBICORT) 160-4.5 MCG/ACT inhaler Inhale 2 puffs into the lungs 2 (two) times daily. 1 Inhaler 3 Taking  . Cholecalciferol (VITAMIN D3) 5000 UNITS TABS Take 1 tablet by mouth every morning.   Taking  . enalapril (VASOTEC) 20 MG tablet Take 1 tablet (20 mg total) by mouth daily. 90 tablet 0 Taking  . enalapril (VASOTEC) 20 MG tablet TAKE ONE TABLET BY MOUTH ONCE DAILY 30 tablet 5   . furosemide (LASIX) 20 MG tablet Take 20 mg by mouth daily.   Taking  . insulin NPH Human (NOVOLIN N RELION) 100 UNIT/ML injection Inject 0.35 mLs (35 Units total) into the skin 2 (two) times daily before a meal. 40 mL 2 Taking  . ipratropium-albuterol (DUONEB) 0.5-2.5 (3) MG/3ML SOLN PLACE 1 VIAL (3 MLS) IN THE NEBULIZER EVERY 6 HOURS AS NEEDED 1080 mL 0 Taking  . Lancet Devices (LANCING DEVICE) MISC USE AS DIRECTED 1 each 2 Taking  . metFORMIN (GLUCOPHAGE) 500 MG tablet TAKE ONE TABLET BY MOUTH IN  THE MORNING AND TWO IN THE EVENING 270 tablet 0 Taking  . metoprolol (LOPRESSOR) 100 MG tablet TAKE ONE TABLET BY MOUTH TWICE DAILY 180 tablet 0 Taking  . NOVOLIN R RELION 100 UNIT/ML injection Inject 32 Units into the skin 2 (two) times daily before a meal.   Taking  . NOVOLIN R RELION 100 UNIT/ML injection INJECT 32 UNITS SUBCUTANEOUSLY TWICE DAILY BEFORE A MEAL 40 mL 2   . promethazine-codeine (PHENERGAN WITH CODEINE) 6.25-10 MG/5ML syrup Take 5 mLs by mouth every 6 (six) hours as needed for cough. 200 mL 0 Taking  . simvastatin (ZOCOR) 40 MG tablet Take 1 tablet (40 mg total) by mouth daily. 90 tablet 0 Taking  . warfarin (COUMADIN) 5 MG tablet TAKE ONE & ONE-HALF TABS BY PO QD ON  MONDAY AND  FRIDAY & THEN TAKE 1 TAB THE REST OF THE WEEK 140 tablet 0  Taking    Assessment: 65 yo male admitted for CAP. PMHX includes afib which he takes coumadin chronically.  Per anticoag clinic and med mec patient take Coumadin 57m daily except 7.577mon Mon and Friday.  INR 1.99 this AM.  Goal of Therapy:  INR 2-3 Monitor platelets by anticoagulation protocol: Yes   Plan:  Coumadin 37m39m 1 Monitor for S/S of bleeding Daily PT/INR  LorIsac SarnaS PhaVena AustriaCPS Clinical Pharmacist Pager #[phone]/2017,7:38 AM

## 2015-06-07 NOTE — Care Management Obs Status (Signed)
[place] NOTIFICATION   Patient Details  Name: KIYOTO BLANKLEY MRN: [id number] Date of Birth: [birth year]   Medicare Observation Status Notification Given:  Yes  Patient notified of Condition Code [address]   Briant Sites, [place] BSN  06/07/2015, 11:09 AM

## 2015-06-07 NOTE — Progress Notes (Addendum)
Patient seen and examined. Admitted after midnight secondary to acute on chronic respiratory failure due to COPD exacerbation and pneumonia. Currently afebrile, hemodynamically stable and in no acute distress. Patient denies any chest pain. Please refer to H&P written by Dr.Lama for further info/details on admission.  Plan: -Continue nebulizer therapy (will add Pulmicort) and the use of SoluMedrol -Start flutter valve -Continue current antibiotics -Follow culture data -Follow clinical response  Justin Ruiz [id number]

## 2015-06-07 NOTE — Care Management Obs Status (Deleted)
[place] NOTIFICATION   Patient Details  Name: Justin Ruiz MRN: [id number] Date of Birth: 1950/07/11   Medicare Observation Status Notification Given:  Yes Patient notified of condition code 25.    Briant Sites, RN 06/07/2015, 12:15 PM

## 2015-06-07 NOTE — ED Notes (Signed)
Discussed course crackles with Dr Regenia Skeeter.  Stop fluid bolus at this time.  Pt received a total of 2.5L NS

## 2015-06-07 NOTE — ED Notes (Signed)
Pt c/o sob, cough, chills that started today, pt states that he wears oxygen at home prn but that it did not help today, pt pulse ox 76% on RA upon arrival to tx room,

## 2015-06-07 NOTE — Progress Notes (Signed)
MD informed blood sugar 414 and heart rate tacking up to 140-160s at times., orders given.

## 2015-06-07 NOTE — ED Notes (Signed)
Per verbal order from Dr Regenia Skeeter, took pt off nonrebreather and applied 6 L Justin Ruiz, O2 saturation dropped from 97% to 91% but pt remained in 90s with no increase in distress

## 2015-06-07 NOTE — ED Notes (Signed)
Notified Dr. Regenia Skeeter on lactic acid

## 2015-06-07 NOTE — Progress Notes (Signed)
ANTIBIOTIC CONSULT NOTE - INITIAL  Pharmacy Consult for Rocephin and Zithromax Indication: pneumonia  Allergies  Allergen Reactions  . Avapro [Irbesartan] Other (See Comments)    "doesnt sit right with me"--light headed  . Lipitor [Atorvastatin Calcium] Other (See Comments)    Leg pain    Patient Measurements: Height: '5\' 9"'  (175.3 cm) Weight: (!) 335 lb 14.4 oz (152.363 kg) IBW/kg (Calculated) : 70.7  Vital Signs: Temp: 100 F (37.8 C) (02/12 0414) Temp Source: Oral (02/12 0414) BP: 100/46 mmHg (02/12 0414) Pulse Rate: 88 (02/12 0414) Intake/Output from previous day:   Intake/Output from this shift:    Labs:  Recent Labs  06/07/15 0115  WBC 9.8  HGB 14.8  PLT 148*  CREATININE 1.03   Estimated Creatinine Clearance: 106 mL/min (by C-G formula based on Cr of 1.03). No results for input(s): VANCOTROUGH, VANCOPEAK, VANCORANDOM, GENTTROUGH, GENTPEAK, GENTRANDOM, TOBRATROUGH, TOBRAPEAK, TOBRARND, AMIKACINPEAK, AMIKACINTROU, AMIKACIN in the last 72 hours.   Microbiology: Recent Results (from the past 720 hour(s))  Fecal occult blood, imunochemical     Status: None   Collection Time: 06/01/15  3:52 PM  Result Value Ref Range Status   Fecal Occult Bld Negative Negative Final  Blood Culture (routine x 2)     Status: None (Preliminary result)   Collection Time: 06/07/15  1:15 AM  Result Value Ref Range Status   Specimen Description BLOOD RIGHT ARM  Final   Special Requests BOTTLES DRAWN AEROBIC AND ANAEROBIC 6CC  Final   Culture PENDING  Incomplete   Report Status PENDING  Incomplete  Blood Culture (routine x 2)     Status: None (Preliminary result)   Collection Time: 06/07/15  1:19 AM  Result Value Ref Range Status   Specimen Description BLOOD LEFT HAND  Final   Special Requests BOTTLES DRAWN AEROBIC AND ANAEROBIC 6CC  Final   Culture PENDING  Incomplete   Report Status PENDING  Incomplete    Medical History: Past Medical History  Diagnosis Date  . Diabetes  mellitus   . Asthma   . Hyperlipidemia   . Obesity     exogenous  . BPH (benign prostatic hypertrophy)   . Colon polyps   . Shingles   . GERD (gastroesophageal reflux disease)   . Arthritis   . Asthma   . HTN (hypertension)     x 3 years  . Lung nodules 03/11/2013  . Mediastinal adenopathy 06/20/2013    CT  & PET 2/15   . Cancer ([place])   . NHL (non-Hodgkin's lymphoma) ([place])     nhl dx 9/11  . History of oxygen administration     occ. oxygen use at 2 l/m as needed- not using at this time  . Sleep apnea     recent study 04-07-14 awaiting results, no cpap yet.-Dr. Glynn Octave  . COPD (chronic obstructive pulmonary disease) ([facility])     Medications:  Prescriptions prior to admission  Medication Sig Dispense Refill Last Dose  . acetaminophen (TYLENOL) 500 MG tablet Take 1,000 mg by mouth every 6 (six) hours as needed for headache. Reported on 04/15/2015   Taking  . albuterol (PROAIR HFA) 108 (90 BASE) MCG/ACT inhaler Inhale 2 puffs into the lungs every 6 (six) hours as needed for wheezing or shortness of breath. 8.5 g 5 Taking  . ALPRAZolam (XANAX) 0.25 MG tablet TAKE ONE TABLET BY MOUTH AT BEDTIME 30 tablet 0 Taking  . Blood Glucose Calibration (TAI DOC CONTROL) NORMAL SOLN Use as directed.   Taking  .  Blood Glucose Monitoring Suppl (CLEVER CHEK AUTO-CODE VOICE) DEVI USE AS DIRECTED 1 each 2 Taking  . budesonide-formoterol (SYMBICORT) 160-4.5 MCG/ACT inhaler Inhale 2 puffs into the lungs 2 (two) times daily. 1 Inhaler 3 Taking  . Cholecalciferol (VITAMIN D3) 5000 UNITS TABS Take 1 tablet by mouth every morning.   Taking  . enalapril (VASOTEC) 20 MG tablet Take 1 tablet (20 mg total) by mouth daily. 90 tablet 0 Taking  . enalapril (VASOTEC) 20 MG tablet TAKE ONE TABLET BY MOUTH ONCE DAILY 30 tablet 5   . furosemide (LASIX) 20 MG tablet Take 20 mg by mouth daily.   Taking  . insulin NPH Human (NOVOLIN N RELION) 100 UNIT/ML injection Inject 0.35 mLs (35 Units total) into the skin 2 (two)  times daily before a meal. 40 mL 2 Taking  . ipratropium-albuterol (DUONEB) 0.5-2.5 (3) MG/3ML SOLN PLACE 1 VIAL (3 MLS) IN THE NEBULIZER EVERY 6 HOURS AS NEEDED 1080 mL 0 Taking  . Lancet Devices (LANCING DEVICE) MISC USE AS DIRECTED 1 each 2 Taking  . metFORMIN (GLUCOPHAGE) 500 MG tablet TAKE ONE TABLET BY MOUTH IN THE MORNING AND TWO IN THE EVENING 270 tablet 0 Taking  . metoprolol (LOPRESSOR) 100 MG tablet TAKE ONE TABLET BY MOUTH TWICE DAILY 180 tablet 0 Taking  . NOVOLIN R RELION 100 UNIT/ML injection Inject 32 Units into the skin 2 (two) times daily before a meal.   Taking  . NOVOLIN R RELION 100 UNIT/ML injection INJECT 32 UNITS SUBCUTANEOUSLY TWICE DAILY BEFORE A MEAL 40 mL 2   . promethazine-codeine (PHENERGAN WITH CODEINE) 6.25-10 MG/5ML syrup Take 5 mLs by mouth every 6 (six) hours as needed for cough. 200 mL 0 Taking  . simvastatin (ZOCOR) 40 MG tablet Take 1 tablet (40 mg total) by mouth daily. 90 tablet 0 Taking  . warfarin (COUMADIN) 5 MG tablet TAKE ONE & ONE-HALF TABS BY PO QD ON  MONDAY AND  FRIDAY & THEN TAKE 1 TAB THE REST OF THE WEEK 140 tablet 0 Taking   Assessment: 65 yo male presents with SOB, coughing, no chest pain, no nausea and febrile 102.8. Patient has PMH of DM, Asthma,Atrial Fib , Hyperlipidemia, obesity , BPH, NHL, and COPD which he is on home oxygen. Empiric therapy for CAP with Zithromax and Rocephin.  Goal of Therapy:  Eradication of infection  Plan:  Zithromax 553m IV daily Rocephin 1gm IV daily Follow up culture results Transition to PO abx as condition warrants Monitor V/S and labs  LIsac Sarna BS PVena [place] BCPS Clinical Pharmacist Pager #[phone] 16962/03/2016,7:31 AM

## 2015-06-08 ENCOUNTER — Encounter: Payer: Self-pay | Admitting: Pharmacist

## 2015-06-08 DIAGNOSIS — K219 Gastro-esophageal reflux disease without esophagitis: Secondary | ICD-10-CM

## 2015-06-08 DIAGNOSIS — R6510 Systemic inflammatory response syndrome (SIRS) of non-infectious origin without acute organ dysfunction: Secondary | ICD-10-CM

## 2015-06-08 DIAGNOSIS — G4733 Obstructive sleep apnea (adult) (pediatric): Secondary | ICD-10-CM

## 2015-06-08 DIAGNOSIS — E875 Hyperkalemia: Secondary | ICD-10-CM

## 2015-06-08 DIAGNOSIS — I482 Chronic atrial fibrillation: Secondary | ICD-10-CM

## 2015-06-08 LAB — PROTIME-INR
INR: 2.37 — AB (ref 0.00–1.49)
Prothrombin Time: 25.7 seconds — ABNORMAL HIGH (ref 11.6–15.2)

## 2015-06-08 LAB — BASIC METABOLIC PANEL
ANION GAP: 8 (ref 5–15)
BUN: 30 mg/dL — ABNORMAL HIGH (ref 6–20)
CHLORIDE: 100 mmol/L — AB (ref 101–111)
CO2: 27 mmol/L (ref 22–32)
CREATININE: 1.01 mg/dL (ref 0.61–1.24)
Calcium: 8.1 mg/dL — ABNORMAL LOW (ref 8.9–10.3)
GFR calc non Af Amer: >60 mL/min (ref >60)
Glucose, Bld: 404 mg/dL — ABNORMAL HIGH (ref 65–99)
Potassium: 5.4 mmol/L — ABNORMAL HIGH (ref 3.5–5.1)
SODIUM: 135 mmol/L (ref 135–145)

## 2015-06-08 LAB — URINE CULTURE

## 2015-06-08 LAB — CBC
HEMATOCRIT: 40.6 % (ref 39.0–52.0)
HEMOGLOBIN: 13.1 g/dL (ref 13.0–17.0)
MCH: 31.3 pg (ref 26.0–34.0)
MCHC: 32.3 g/dL (ref 30.0–36.0)
MCV: 97.1 fL (ref 78.0–100.0)
Platelets: 131 10*3/uL — ABNORMAL LOW (ref 150–400)
RBC: 4.18 MIL/uL — AB (ref 4.22–5.81)
RDW: 13.6 % (ref 11.5–15.5)
WBC: 9.3 10*3/uL (ref 4.0–10.5)

## 2015-06-08 LAB — HEMOGLOBIN A1C
HEMOGLOBIN A1C: 7.3 % — AB (ref 4.8–5.6)
MEAN PLASMA GLUCOSE: 163 mg/dL

## 2015-06-08 LAB — MAGNESIUM: MAGNESIUM: 1.6 mg/dL — AB (ref 1.7–2.4)

## 2015-06-08 LAB — GLUCOSE, CAPILLARY
GLUCOSE-CAPILLARY: 266 mg/dL — AB (ref 65–99)
GLUCOSE-CAPILLARY: 352 mg/dL — AB (ref 65–99)
GLUCOSE-CAPILLARY: 434 mg/dL — AB (ref 65–99)
Glucose-Capillary: 345 mg/dL — ABNORMAL HIGH (ref 65–99)

## 2015-06-08 LAB — GLUCOSE, RANDOM: GLUCOSE: 454 mg/dL — AB (ref 65–99)

## 2015-06-08 MED ORDER — DOXYCYCLINE HYCLATE 100 MG PO TABS
100.0000 mg | ORAL_TABLET | Freq: Two times a day (BID) | ORAL | Status: DC
  Administered 2015-06-08 – 2015-06-09 (×2): 100 mg via ORAL
  Filled 2015-06-08 (×2): qty 1

## 2015-06-08 MED ORDER — DEXTROMETHORPHAN POLISTIREX ER 30 MG/5ML PO SUER
30.0000 mg | Freq: Two times a day (BID) | ORAL | Status: DC | PRN
  Filled 2015-06-08: qty 5

## 2015-06-08 MED ORDER — WARFARIN SODIUM 5 MG PO TABS
7.5000 mg | ORAL_TABLET | Freq: Once | ORAL | Status: AC
  Administered 2015-06-08: 7.5 mg via ORAL
  Filled 2015-06-08: qty 2

## 2015-06-08 MED ORDER — INSULIN ASPART 100 UNIT/ML ~~LOC~~ SOLN
20.0000 [IU] | Freq: Once | SUBCUTANEOUS | Status: AC
  Administered 2015-06-08: 20 [IU] via SUBCUTANEOUS

## 2015-06-08 MED ORDER — METHYLPREDNISOLONE SODIUM SUCC 125 MG IJ SOLR
60.0000 mg | Freq: Two times a day (BID) | INTRAMUSCULAR | Status: DC
  Administered 2015-06-08: 60 mg via INTRAVENOUS
  Filled 2015-06-08: qty 2

## 2015-06-08 NOTE — Clinical Documentation Improvement (Signed)
Hospitalist  Abnormal Lab/Test Results:  Potassium was 5.4 on admission  Possible Clinical Conditions associated with below indicators  Hyperkalemia  Other Condition  Cannot Clinically Determine   Supporting Information: Potassium on admission was 5.4   Please exercise your independent, professional judgment when responding. A specific answer is not anticipated or expected.   Thank You,  [place] [phone]

## 2015-06-08 NOTE — Progress Notes (Signed)
[place] for Coumadin Indication: atrial fibrillation  Allergies  Allergen Reactions  . Avapro [Irbesartan] Other (See Comments)    "doesnt sit right with me"--light headed  . Lipitor [Atorvastatin Calcium] Other (See Comments)    Leg pain    Patient Measurements: Height: _0  (175.3 cm) Weight: (!) 335 lb 14.4 oz (152.363 kg) IBW/kg (Calculated) : 70.7  Vital Signs: Temp: 97.8 F (36.6 C) (02/13 0512) Temp Source: Oral (02/13 0512) BP: 102/74 mmHg (02/13 0512) Pulse Rate: 108 (02/13 0512)  Labs:  Recent Labs  06/07/15 0115 06/07/15 0706 06/08/15 0524  HGB 14.8 14.2 13.1  HCT 44.7 43.6 40.6  PLT 148* 141* 131*  LABPROT 22.5*  --  25.7*  INR 1.99*  --  2.37*  CREATININE 1.03 0.96 1.01    Estimated Creatinine Clearance: 108.1 mL/min (by C-G formula based on Cr of 1.01).   Medical History: Past Medical History  Diagnosis Date  . Diabetes mellitus   . Asthma   . Hyperlipidemia   . Obesity     exogenous  . BPH (benign prostatic hypertrophy)   . Colon polyps   . Shingles   . GERD (gastroesophageal reflux disease)   . Arthritis   . Asthma   . HTN (hypertension)     x 3 years  . Lung nodules 03/11/2013  . Mediastinal adenopathy 06/20/2013    CT  & PET 2/15   . Cancer ([place])   . NHL (non-Hodgkin's lymphoma) ([place])     nhl dx 9/11  . History of oxygen administration     occ. oxygen use at 2 l/m as needed- not using at this time  . Sleep apnea     recent study 04-07-14 awaiting results, no cpap yet.-Dr. Glynn Octave  . COPD (chronic obstructive pulmonary disease) (H[facility])     Medications:  Prescriptions prior to admission  Medication Sig Dispense Refill Last Dose  . acetaminophen (TYLENOL) 500 MG tablet Take 1,000 mg by mouth every 6 (six) hours as needed for headache. Reported on 04/15/2015   Past Week at Unknown time  . albuterol (PROAIR HFA) 108 (90 BASE) MCG/ACT inhaler Inhale 2 puffs into the lungs every 6 (six) hours  as needed for wheezing or shortness of breath. 8.5 g 5 06/06/2015 at Unknown time  . ALPRAZolam (XANAX) 0.25 MG tablet TAKE ONE TABLET BY MOUTH AT BEDTIME 30 tablet 0 06/06/2015 at Unknown time  . budesonide-formoterol (SYMBICORT) 160-4.5 MCG/ACT inhaler Inhale 2 puffs into the lungs 2 (two) times daily. 1 Inhaler 3 06/06/2015 at Unknown time  . Cholecalciferol (VITAMIN D3) 5000 UNITS TABS Take 1 tablet by mouth every morning.   06/06/2015 at Unknown time  . enalapril (VASOTEC) 20 MG tablet Take 1 tablet (20 mg total) by mouth daily. 90 tablet 0 06/06/2015 at Unknown time  . furosemide (LASIX) 20 MG tablet Take 20 mg by mouth daily.   06/06/2015 at Unknown time  . insulin NPH Human (NOVOLIN N RELION) 100 UNIT/ML injection Inject 0.35 mLs (35 Units total) into the skin 2 (two) times daily before a meal. 40 mL 2 06/06/2015 at Unknown time  . ipratropium-albuterol (DUONEB) 0.5-2.5 (3) MG/3ML SOLN PLACE 1 VIAL (3 MLS) IN THE NEBULIZER EVERY 6 HOURS AS NEEDED 1080 mL 0 06/06/2015 at Unknown time  . metFORMIN (GLUCOPHAGE) 500 MG tablet TAKE ONE TABLET BY MOUTH IN THE MORNING AND TWO IN THE EVENING 270 tablet 0 06/06/2015 at Unknown time  . metoprolol (LOPRESSOR) 100 MG  tablet TAKE ONE TABLET BY MOUTH TWICE DAILY 180 tablet 0 06/06/2015 at 1800  . NOVOLIN R RELION 100 UNIT/ML injection Inject 32 Units into the skin 2 (two) times daily before a meal.   06/06/2015 at Unknown time  . promethazine-codeine (PHENERGAN WITH CODEINE) 6.25-10 MG/5ML syrup Take 5 mLs by mouth every 6 (six) hours as needed for cough. 200 mL 0 Past Week at Unknown time  . simvastatin (ZOCOR) 40 MG tablet Take 1 tablet (40 mg total) by mouth daily. 90 tablet 0 06/06/2015 at Unknown time  . warfarin (COUMADIN) 5 MG tablet TAKE ONE & ONE-HALF TABS BY PO QD ON  MONDAY AND  FRIDAY & THEN TAKE 1 TAB THE REST OF THE WEEK 140 tablet 0 06/06/2015 at 1800  . Blood Glucose Calibration (TAI DOC CONTROL) NORMAL SOLN Use as directed.   Taking  . Blood Glucose  Monitoring Suppl (CLEVER CHEK AUTO-CODE VOICE) DEVI USE AS DIRECTED 1 each 2 Taking  . Lancet Devices (LANCING DEVICE) MISC USE AS DIRECTED 1 each 2 Taking    Assessment: 65 yo male admitted for CAP. PMHX includes afib which he takes coumadin chronically.  Per anticoag clinic and med mec patient take Coumadin 53m daily except 7.537mon Mon and Friday.  INR 2.37 this AM.  Goal of Therapy:  INR 2-3 Monitor platelets by anticoagulation protocol: Yes   Plan:  Coumadin 7.71m32m 1 Monitor for S/S of bleeding Daily PT/INR  LorIsac SarnaS PhaVena AustriaCPS Clinical Pharmacist Pager #[phone]/2017,1:10 PM

## 2015-06-08 NOTE — Progress Notes (Signed)
Inpatient Diabetes Program Recommendations  AACE/ADA: New Consensus Statement on Inpatient Glycemic Control (2015)  Target Ranges:  Prepandial:   less than 140 mg/dL      Peak postprandial:   less than 180 mg/dL (1-2 hours)      Critically ill patients:  140 - 180 mg/dL  Results for Justin Ruiz, Justin Ruiz (MRN [id number]) as of 06/08/2015 12:39  Ref. Range 06/07/2015 08:18 06/07/2015 11:03 06/07/2015 16:19 06/07/2015 16:41 06/07/2015 21:48 06/08/2015 07:35 06/08/2015 11:29  Glucose-Capillary Latest Ref Range: 65-99 mg/dL 189 (H) 375 (H) 414 (H) 477 (H) 454 (H) 352 (H) 434 (H)   Review of Glycemic Control  Diabetes history: DM2 Outpatient Diabetes medications: NPH 35 units BID, Novolin R 32 units BID with meals, Metformin 500 mg QAM, Metformin 1000 mg QPM Current orders for Inpatient glycemic control: NPH 40 units BID, Novolog 0-20 units TID with meals  Inpatient Diabetes Program Recommendations: Correction (SSI): Please order Novolog bedtime correction scale. Insulin - Meal Coverage: Note steroid frequency was decreased to Solumedrol 60 mg Q12H today. Please consider ordering Novolog 10 units TID with meals for meal coverage.   Thanks, Barnie Alderman, RN, MSN, CDE Diabetes Coordinator Inpatient Diabetes Program [phone] (Team Pager from [place] to [place]) [phone] ([place] office) [phone] [facility] office) [phone] [facility] office)

## 2015-06-08 NOTE — Clinical Documentation Improvement (Signed)
Hospitalist  Would you please help clarify the specific medical condition related to clinical findings?   Sepsis due to pneumonia  Other  Clinically Undetermined   Supporting Information: On admission BP 95/65, Pulse 132, Temp 102.8, resp rate 25, WBC 11.4, Lactic acid 2.25.   Please exercise your independent, professional judgment when responding. A specific answer is not anticipated or expected.   Thank You,  [place] [phone]

## 2015-06-08 NOTE — Progress Notes (Signed)
Pt CBG [phone], paged Dr Dyann Kief.  Order placed for 20 Units of Novolog and modified solu-medrol.  Will continue to monitor pt.

## 2015-06-08 NOTE — Progress Notes (Addendum)
[facility] PROGRESS NOTE  Justin Ruiz [id number] DOB: [birth year] DOA: 06/07/2015 PCP: Redge Gainer, MD  Assessment/Plan: 1-acute on chronic resp failure: due to CAP and COPD exacerbation. Patient also with OSA and OHS affecting his breathing -will continue nebulizer treatment, antibiotics, flutter valve, steroids therapy and oxygen supplementation -symptoms improving, but still wheezing and moving poor air -PRN antitussives to control cough will be also available  -continue mucinex -will start transition of antibiotics to PO  2-OSA: continue CPAP  3-atrial fibrillation: on admission was experiencing non sustained episodes of RVR -continue metoprolol -on coumadin, pharmacy dosing -on xopenex, instead of albuterol   4-GERD: will continue PPI  5-essential HTN: BP soft on admission -now improving -will continue slowly resuming home antihypertensive meds  6-chronic diastolic heart failure:  -compensated currently -will resume diuretics on 2/14 -continue daily weights and strict intake and output  -continue b-blockers  7-type 2 diabetes with hyperglycemia and chronic use of insulin: -CBG's higher than usual due to steroids  -will use resistant SSI and continue NPH (dose slightly higher than his home dose) -adjust as needed base on CBG's fluctuation  -A1C 7.3  8-Obesity: -Body mass index is 49.58 kg/(m^2). -low calorie diet and increase exercise discussed with patient    9-hyperkalemia:  -most likely due to hemolysis -will check BMET in am again -no abnormalities appreciated on telemetry   Code Status: full Family Communication: no family at bedside  Disposition Plan: home when medically stable    Consultants:  None   Procedures:  See below for x-ray reports   Antibiotics:  Ceftriaxone and zithromax 2/12>>2/13  Doxycycline 2/13  HPI/Subjective: Afebrile, denies orthopnea, feeling better overall. Still wheezing and with decrease air  movement.  Objective: Filed Vitals:   06/08/15 0512 06/08/15 1452  BP: 102/74 121/66  Pulse: 108 88  Temp: 97.8 F (36.6 C) 97.8 F (36.6 C)  Resp: 20 20    Intake/Output Summary (Last 24 hours) at 06/08/15 1606 Last data filed at 06/08/15 1452  Gross per 24 hour  Intake    780 ml  Output    350 ml  Net    430 ml   Filed Weights   06/07/15 0414  Weight: 152.363 kg (335 lb 14.4 oz)    Exam:   General:  Afebrile currently, feeling better and endorses breathing is improved. Intercostal muscles are sores from coughing; otherwise no acute complaints.  Cardiovascular: rate controlled now; no rubs, no gallops. Yesterday evening (06/07/15) and overnight with   Respiratory: positive wheezing and decrease air movement; also with scattered rhonchi (L > R)  Abdomen: soft, obese, no guarding, positive BS  Musculoskeletal: trace to 1 + edema bilaterally, no cyanosis or clubbing   Data Reviewed: Basic Metabolic Panel:  Recent Labs Lab 06/07/15 0115 06/07/15 0706 06/08/15 0524 06/08/15 1155  NA 138 139 135  --   K 4.6 5.0 5.4*  --   CL 102 105 100*  --   CO2 28 26 27   --   GLUCOSE 189* 184* 404* 454*  BUN 26* 24* 30*  --   CREATININE 1.03 0.96 1.01  --   CALCIUM 8.8* 7.9* 8.1*  --   MG  --   --  1.6*  --    Liver Function Tests:  Recent Labs Lab 06/07/15 0115 06/07/15 0706  AST 28 34  ALT 20 23  ALKPHOS 57 50  BILITOT 1.2 1.0  PROT 6.9 6.7  ALBUMIN 3.6 3.5   CBC:  Recent Labs Lab 06/07/15  0115 06/07/15 0706 06/08/15 0524  WBC 9.8 11.4* 9.3  NEUTROABS 8.7*  --   --   HGB 14.8 14.2 13.1  HCT 44.7 43.6 40.6  MCV 94.9 96.0 97.1  PLT 148* 141* 131*   BNP (last 3 results)  Recent Labs  06/07/15 0338  BNP 206.0*   CBG:  Recent Labs Lab 06/07/15 1619 06/07/15 1641 06/07/15 2148 06/08/15 0735 06/08/15 1129  GLUCAP 414* 477* 454* 352* 434*    Recent Results (from the past 240 hour(s))  Fecal occult blood, imunochemical     Status: None    Collection Time: 06/01/15  3:52 PM  Result Value Ref Range Status   Fecal Occult Bld Negative Negative Final  Blood Culture (routine x 2)     Status: None (Preliminary result)   Collection Time: 06/07/15  1:15 AM  Result Value Ref Range Status   Specimen Description BLOOD RIGHT ARM  Final   Special Requests BOTTLES DRAWN AEROBIC AND ANAEROBIC 6CC  Final   Culture NO GROWTH 1 DAY  Final   Report Status PENDING  Incomplete  Blood Culture (routine x 2)     Status: None (Preliminary result)   Collection Time: 06/07/15  1:19 AM  Result Value Ref Range Status   Specimen Description BLOOD LEFT HAND  Final   Special Requests BOTTLES DRAWN AEROBIC AND ANAEROBIC 6CC  Final   Culture NO GROWTH 1 DAY  Final   Report Status PENDING  Incomplete  Urine culture     Status: None   Collection Time: 06/07/15  2:50 AM  Result Value Ref Range Status   Specimen Description URINE, CLEAN CATCH  Final   Special Requests NONE  Final   Culture   Final    1,000 COLONIES/mL INSIGNIFICANT GROWTH Performed at [facility]    Report Status 06/08/2015 FINAL  Final     Studies: Dg Chest Port 1 View  06/07/2015  CLINICAL DATA:  Acute onset of shortness of breath, cough and chills. Decreased O2 saturation. Initial encounter. EXAM: PORTABLE CHEST 1 VIEW COMPARISON:  CT of the chest performed 04/09/2015 FINDINGS: Vascular congestion is noted. Increased interstitial markings may reflect mild pulmonary edema. No pleural effusion or pneumothorax is seen. Known tiny pulmonary nodules are not well characterized on radiograph. The cardiomediastinal silhouette is borderline normal in size. No acute osseous abnormalities are seen. IMPRESSION: Vascular congestion noted. Increased interstitial markings may reflect mild pulmonary edema. Electronically Signed   By: Garald Balding M.D.   On: 06/07/2015 02:01    Scheduled Meds: . azithromycin  500 mg Intravenous Q24H  . budesonide (PULMICORT) nebulizer solution  0.25 mg  Nebulization BID  . cefTRIAXone (ROCEPHIN)  IV  1 g Intravenous Q24H  . guaiFENesin  1,200 mg Oral BID  . insulin aspart  0-20 Units Subcutaneous TID WC  . insulin NPH Human  40 Units Subcutaneous BID AC  . ipratropium  0.5 mg Nebulization Q6H  . levalbuterol  0.63 mg Nebulization Q6H  . methylPREDNISolone (SOLU-MEDROL) injection  60 mg Intravenous Q12H  . metoprolol tartrate  50 mg Oral BID  . warfarin  7.5 mg Oral Once  . Warfarin - Pharmacist Dosing Inpatient   Does not apply q1800   Continuous Infusions:   Principal Problem:   Acute-on-chronic respiratory failure (HCC) Active Problems:   NHL (non-Hodgkin's lymphoma) (HCC)   Diabetes mellitus type 2, insulin dependent (HCC)   COPD exacerbation (HCC)   FIBRILLATION, ATRIAL   Sepsis ([place])    Time spent:  35 minutes    Barton Dubois  [facility] Pager [phone]. If 7PM-7AM, please contact night-coverage at [email or web address], password [facility] 06/08/2015, 4:06 PM  LOS: 1 day

## 2015-06-09 DIAGNOSIS — I5032 Chronic diastolic (congestive) heart failure: Secondary | ICD-10-CM | POA: Insufficient documentation

## 2015-06-09 DIAGNOSIS — J189 Pneumonia, unspecified organism: Secondary | ICD-10-CM

## 2015-06-09 LAB — BASIC METABOLIC PANEL
ANION GAP: 5 (ref 5–15)
BUN: 34 mg/dL — AB (ref 6–20)
CO2: 31 mmol/L (ref 22–32)
Calcium: 8.5 mg/dL — ABNORMAL LOW (ref 8.9–10.3)
Chloride: 103 mmol/L (ref 101–111)
Creatinine, Ser: 0.85 mg/dL (ref 0.61–1.24)
GFR calc Af Amer: >60 mL/min (ref >60)
GFR calc non Af Amer: >60 mL/min (ref >60)
GLUCOSE: 248 mg/dL — AB (ref 65–99)
POTASSIUM: 5.1 mmol/L (ref 3.5–5.1)
Sodium: 139 mmol/L (ref 135–145)

## 2015-06-09 LAB — GLUCOSE, CAPILLARY
GLUCOSE-CAPILLARY: 355 mg/dL — AB (ref 65–99)
Glucose-Capillary: 232 mg/dL — ABNORMAL HIGH (ref 65–99)

## 2015-06-09 LAB — PROTIME-INR
INR: 2.69 — ABNORMAL HIGH (ref 0.00–1.49)
Prothrombin Time: 28.2 seconds — ABNORMAL HIGH (ref 11.6–15.2)

## 2015-06-09 MED ORDER — DOXYCYCLINE HYCLATE 100 MG PO TABS: 100.0000 mg | ORAL_TABLET | Freq: Two times a day (BID) | ORAL | Status: DC

## 2015-06-09 MED ORDER — METHYLPREDNISOLONE SODIUM SUCC 125 MG IJ SOLR
60.0000 mg | Freq: Every day | INTRAMUSCULAR | Status: DC
  Administered 2015-06-09: 60 mg via INTRAVENOUS
  Filled 2015-06-09: qty 2

## 2015-06-09 MED ORDER — INSULIN NPH (HUMAN) (ISOPHANE) 100 UNIT/ML ~~LOC~~ SUSP: 40.0000 [IU] | Freq: Two times a day (BID) | SUBCUTANEOUS | Status: DC

## 2015-06-09 MED ORDER — DEXTROMETHORPHAN POLISTIREX ER 30 MG/5ML PO SUER: 30.0000 mg | Freq: Two times a day (BID) | ORAL | Status: DC | PRN

## 2015-06-09 MED ORDER — PREDNISONE 20 MG PO TABS: ORAL_TABLET | ORAL | Status: DC

## 2015-06-09 NOTE — Progress Notes (Signed)
Inpatient Diabetes Program Recommendations  AACE/ADA: New Consensus Statement on Inpatient Glycemic Control (2015)  Target Ranges:  Prepandial:   less than 140 mg/dL      Peak postprandial:   less than 180 mg/dL (1-2 hours)      Critically ill patients:  140 - 180 mg/dL  Results for Justin Ruiz, Justin Ruiz (MRN [id number]) as of 06/09/2015 10:07  Ref. Range 06/08/2015 07:35 06/08/2015 11:29 06/08/2015 16:23 06/08/2015 23:36 06/09/2015 07:54  Glucose-Capillary Latest Ref Range: 65-99 mg/dL 352 (H) Novolog 20 units 434 (H) Novolog 20 units 345 (H) Novolog 15 units 266 (H)  232 (H)   Review of Glycemic Control  Diabetes history: DM2 Outpatient Diabetes medications: NPH 35 units BID, Novolin R 32 units BID with meals, Metformin 500 mg QAM, Metformin 1000 mg QPM Current orders for Inpatient glycemic control: NPH 40 units BID, Novolog 0-20 units TID with meals  Inpatient Diabetes Program Recommendations: Correction (SSI): Please order Novolog bedtime correction scale. Insulin - Meal Coverage: Note steroid frequency was decreased to Solumedrol 60 mg daily. Please consider ordering Novolog 6 units TID with meals for meal coverage.   Thanks, Barnie Alderman, RN, MSN, CDE Diabetes Coordinator Inpatient Diabetes Program [phone] (Team Pager from [place] to [place]) [phone] ([place] office) [phone] [facility] office) [phone] [facility] office)

## 2015-06-09 NOTE — Plan of Care (Signed)
Problem: Discharge Progression Outcomes Goal: O2 sats > or equal 90% or at baseline Outcome: Completed/Met Date Met:  06/09/15 On home O2 Goal: Independent ADLs or Home Health Care Outcome: Completed/Met Date Met:  06/09/15 Self care

## 2015-06-09 NOTE — Discharge Summary (Signed)
Physician Discharge Summary  Justin Ruiz MGQ:[id number] DOB: 04-May-1950 DOA: 06/07/2015  PCP: Redge Gainer, MD  Admit date: 06/07/2015 Discharge date: 06/09/2015  Time spent:40 minutes  Recommendations for Outpatient Follow-up:  Check BMET to follow electrolytes and renal function Reassess BP and adjust medications as needed Please reassess blood sugar and is stable adjust insulin dose, which was increase to cover use of steroids   Discharge Diagnoses:  Principal Problem:   Acute-on-chronic respiratory failure ([place]) Active Problems:   NHL (non-Hodgkin's lymphoma) ([place])   Diabetes mellitus type 2, insulin dependent ([place])   COPD exacerbation (HCC)   FIBRILLATION, ATRIAL   SIRS ([facility])    CAP/bronchitis    Discharge Condition: stable and improved. Discharge home with instructions to follow up with PCP and with his pulmonologist at discharge.  Diet recommendation: heart healthy, low calorie and modified carbohydrates diet   Filed Weights   06/07/15 0414  Weight: 152.363 kg (335 lb 14.4 oz)    History of present illness:  65 year old male whohas a past medical history of Diabetes mellitus; Asthma; Hyperlipidemia; Obesity; BPH (benign prostatic hypertrophy); Colon polyps; GERD (gastroesophageal reflux disease); Arthritis; Asthma; HTN (hypertension); Lung nodules (03/11/2013); Mediastinal adenopathy (06/20/2013); Cancer ([place]); NHL (non-Hodgkin's lymphoma) ([place]); History of oxygen administration for chronic resp failure; Sleep apnea; and COPD (chronic obstructive pulmonary disease) ([place]). Who presented to the hospital with complaints of shortness of breath, productive cough and increase oxygen supplementation from his baseline. No chest pain. No nausea, vomiting or diarrhea. In the ED patient was found to have fever 102.8. Empirically started on Rocephin and Zithromax for possible pneumonia. Chest x-ray showed interstitial edema and bronchitic changes. White count was normal. Patient had  mild elevation of lactate 2.25 and tachypneic with RR 26-27 on presentation.Marland Kitchen  Hospital Course:  1-acute on chronic resp failure: due to CAP/bronchitis and COPD exacerbation. Patient also with OSA and OHS affecting his breathing -will continue home nebulizer/inhaler treatment, discharge on antibiotics and tapering steroids -continue oxygen supplementation as needed and continue weaning to baseline as tolerated  -PRN antitussives to control cough also available at discharge -ask to follow with PCP in 10 days and with his pulmonologist in 2-3 weeks for further evaluation and adjustment as needed of his maintenance therapy.  2-OSA: continue CPAP QHS  3-atrial fibrillation: on admission was experiencing non sustained episodes of RVR -corrected with resumption of his B-blocker and control of COPD/PNA -continue coumadin for coagulation prevention   4-GERD: will continue PPI  5-essential HTN: BP soft on admission -now improved/back to baseline -will continue home antihypertensive regimen   6-chronic diastolic heart failure:  -compensated currently -will resume diuretics on 2/14 -encourage to follow low sodium diet and continue daily weights   -continue b-blockers  7-type 2 diabetes with hyperglycemia on chronic use of insulin: -CBG's higher than usual due to steroids  - NPH (dose slightly higher than his home dose to help with high CBG's due to steroids) -adjust as needed base on CBG's fluctuation  -A1C 7.3  8-Obesity: -Body mass index is 49.58 kg/(m^2). -low calorie diet and increase exercise discussed with patient   9-hyperkalemia:  -most likely due to hemolysis -repeat BMET with normal potassium -recommending close follow up of electrolytes  10-SIRS: present on admission and due to CAP -SIRS features resolved at discharge   Procedures: See below for x-ray reports  Consultations:  None   Discharge Exam: Filed Vitals:   06/08/15 1452 06/08/15 2211  BP: 121/66  118/65  Pulse: 88 90  Temp: 97.8  F (36.6 C) 97.7 F (36.5 C)  Resp: 20 20     General: Afebrile currently, feeling better and endorses breathing is significantly  improved. Intercostal muscles are sores from coughing; otherwise no acute complaints and asking to be discharge.  Cardiovascular: rate controlled now; no rubs, no gallops, no murmur  Respiratory: improved air movement, minimal exp wheezing, no crackles. Positive scattered rhonchi.  Abdomen: soft, obese, no guarding, positive BS  Musculoskeletal: trace to 1 + edema bilaterally, no cyanosis or clubbing   Discharge Instructions   Discharge Instructions    Diet - low sodium heart healthy    Complete by:  As directed      Discharge instructions    Complete by:  As directed   Take medications as prescribed Use flutter valve as instructed  Arrange follow up with PCP in 10 days Follow up with pulmonologist as discussed  Follow a low calorie/heart healthy and modified carbohydrates diet  Check your weight on daily basis          Current Discharge Medication List    START taking these medications   Details  dextromethorphan (DELSYM) 30 MG/5ML liquid Take 5 mLs (30 mg total) by mouth every 12 (twelve) hours as needed for cough. Qty: 89 mL, Refills: 0    doxycycline (VIBRA-TABS) 100 MG tablet Take 1 tablet (100 mg total) by mouth every 12 (twelve) hours. Qty: 14 tablet, Refills: 0    predniSONE (DELTASONE) 20 MG tablet Take 3 tablets by mouth daily X 2 days; then 2 tablets by mouth daily X 2 days; then 1 tablet by mouth daily X 3 days; then 1/2 tablet by mouth daily X 3 days and stop prednisone. Qty: 15 tablet, Refills: 0      CONTINUE these medications which have CHANGED   Details  insulin NPH Human (NOVOLIN N RELION) 100 UNIT/ML injection Inject 0.4 mLs (40 Units total) into the skin 2 (two) times daily before a meal.      CONTINUE these medications which have NOT CHANGED   Details  acetaminophen  (TYLENOL) 500 MG tablet Take 1,000 mg by mouth every 6 (six) hours as needed for headache. Reported on 04/15/2015    albuterol (PROAIR HFA) 108 (90 BASE) MCG/ACT inhaler Inhale 2 puffs into the lungs every 6 (six) hours as needed for wheezing or shortness of breath. Qty: 8.5 g, Refills: 5    ALPRAZolam (XANAX) 0.25 MG tablet TAKE ONE TABLET BY MOUTH AT BEDTIME Qty: 30 tablet, Refills: 0    budesonide-formoterol (SYMBICORT) 160-4.5 MCG/ACT inhaler Inhale 2 puffs into the lungs 2 (two) times daily. Qty: 1 Inhaler, Refills: 3    Cholecalciferol (VITAMIN D3) 5000 UNITS TABS Take 1 tablet by mouth every morning.    enalapril (VASOTEC) 20 MG tablet Take 1 tablet (20 mg total) by mouth daily. Qty: 90 tablet, Refills: 0    furosemide (LASIX) 20 MG tablet Take 20 mg by mouth daily.    ipratropium-albuterol (DUONEB) 0.5-2.5 (3) MG/3ML SOLN PLACE 1 VIAL (3 MLS) IN THE NEBULIZER EVERY 6 HOURS AS NEEDED Qty: 1080 mL, Refills: 0    metFORMIN (GLUCOPHAGE) 500 MG tablet TAKE ONE TABLET BY MOUTH IN THE MORNING AND TWO IN THE EVENING Qty: 270 tablet, Refills: 0    metoprolol (LOPRESSOR) 100 MG tablet TAKE ONE TABLET BY MOUTH TWICE DAILY Qty: 180 tablet, Refills: 0    NOVOLIN R RELION 100 UNIT/ML injection Inject 32 Units into the skin 2 (two) times daily before a meal.  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 Qty: 200 mL, Refills: 0    simvastatin (ZOCOR) 40 MG tablet Take 1 tablet (40 mg total) by mouth daily. Qty: 90 tablet, Refills: 0    warfarin (COUMADIN) 5 MG tablet TAKE ONE & ONE-HALF TABS BY PO QD ON  MONDAY AND  FRIDAY & THEN TAKE 1 TAB THE REST OF THE WEEK Qty: 140 tablet, Refills: 0    Blood Glucose Calibration (TAI DOC CONTROL) NORMAL SOLN Use as directed.    Blood Glucose  Monitoring Suppl (CLEVER CHEK AUTO-CODE VOICE) DEVI USE AS DIRECTED Qty: 1 each, Refills: 2    Lancet Devices (LANCING DEVICE) MISC USE AS DIRECTED Qty: 1 each, Refills: 2       Allergies  Allergen Reactions  . Avapro [Irbesartan] Other (See Comments)    "doesnt sit right with me"--light headed  . Lipitor [Atorvastatin Calcium] Other (See Comments)    Leg pain   Follow-up Information    Follow up with Redge Gainer, MD. Schedule an appointment as soon as possible for a visit in 10 days.   Specialty:  Family Medicine   Contact information:   [place] [id number] [phone]       The results of significant diagnostics from this hospitalization (including imaging, microbiology, ancillary and laboratory) are listed below for reference.    Significant Diagnostic Studies: Dg Chest Port 1 View  06/07/2015  CLINICAL DATA:  Acute onset of shortness of breath, cough and chills. Decreased O2 saturation. Initial encounter. EXAM: PORTABLE CHEST 1 VIEW COMPARISON:  CT of the chest performed 04/09/2015 FINDINGS: Vascular congestion is noted. Increased interstitial markings may reflect mild pulmonary edema. No pleural effusion or pneumothorax is seen. Known tiny pulmonary nodules are not well characterized on radiograph. The cardiomediastinal silhouette is borderline normal in size. No acute osseous abnormalities are seen. IMPRESSION: Vascular congestion noted. Increased interstitial markings may reflect mild pulmonary edema. Electronically Signed   By: Garald Balding M.D.   On: 06/07/2015 02:01    Microbiology: Recent Results (from the past 240 hour(s))  Fecal occult blood, imunochemical     Status: None   Collection Time: 06/01/15  3:52 PM  Result Value Ref Range Status   Fecal Occult Bld Negative Negative Final  Blood Culture (routine x 2)     Status: None (Preliminary result)   Collection Time: 06/07/15  1:15 AM  Result Value Ref Range Status   Specimen Description  BLOOD RIGHT ARM  Final   Special Requests BOTTLES DRAWN AEROBIC AND ANAEROBIC 6CC  Final   Culture NO GROWTH 2 DAYS  Final   Report Status PENDING  Incomplete  Blood Culture (routine x 2)     Status: None (Preliminary result)   Collection Time: 06/07/15  1:19 AM  Result Value Ref Range Status   Specimen Description BLOOD LEFT HAND  Final   Special Requests BOTTLES DRAWN AEROBIC AND ANAEROBIC 6CC  Final   Culture NO GROWTH 2 DAYS  Final   Report Status PENDING  Incomplete  Urine culture     Status: None   Collection Time: 06/07/15  2:50 AM  Result Value Ref Range Status   Specimen Description URINE, CLEAN CATCH  Final   Special Requests NONE  Final   Culture   Final  1,000 COLONIES/mL INSIGNIFICANT GROWTH Performed at [facility]    Report Status 06/08/2015 FINAL  Final     Labs: Basic Metabolic Panel:  Recent Labs Lab 06/07/15 0115 06/07/15 0706 06/08/15 0524 06/08/15 1155 06/09/15 0543  NA 138 139 135  --  139  K 4.6 5.0 5.4*  --  5.1  CL 102 105 100*  --  103  CO2 '28 26 27  ' --  31  GLUCOSE 189* 184* 404* 454* 248*  BUN 26* 24* 30*  --  34*  CREATININE 1.03 0.96 1.01  --  0.85  CALCIUM 8.8* 7.9* 8.1*  --  8.5*  MG  --   --  1.6*  --   --    Liver Function Tests:  Recent Labs Lab 06/07/15 0115 06/07/15 0706  AST 28 34  ALT 20 23  ALKPHOS 57 50  BILITOT 1.2 1.0  PROT 6.9 6.7  ALBUMIN 3.6 3.5   CBC:  Recent Labs Lab 06/07/15 0115 06/07/15 0706 06/08/15 0524  WBC 9.8 11.4* 9.3  NEUTROABS 8.7*  --   --   HGB 14.8 14.2 13.1  HCT 44.7 43.6 40.6  MCV 94.9 96.0 97.1  PLT 148* 141* 131*   BNP: BNP (last 3 results)  Recent Labs  06/07/15 0338  BNP 206.0*    CBG:  Recent Labs Lab 06/08/15 1129 06/08/15 1623 06/08/15 2336 06/09/15 0754 06/09/15 1056  GLUCAP 434* 345* 266* 232* 355*    Signed:  Barton Dubois MD.  [facility] 06/09/2015, 12:37 PM

## 2015-06-09 NOTE — Care Management Important Message (Signed)
Important Message  Patient Details  Name: Justin Ruiz MRN: [id number] Date of Birth: 01-21-1951   Medicare Important Message Given:  Yes    Alvie Heidelberg, RN 06/09/2015, 1:11 PM

## 2015-06-09 NOTE — Progress Notes (Signed)
Discharged to home with family and O2. Discharge instructions read to patient and his family. Both verbalized understanding of all instructions

## 2015-06-10 ENCOUNTER — Telehealth: Payer: Self-pay | Admitting: *Deleted

## 2015-06-10 NOTE — Telephone Encounter (Signed)
Call Completed and Appointment Scheduled: Yes, Date: 06/23/15 with Dr Anabel Halon INFORMATION Date of Discharge:06/09/15   Discharge Facility: [facility] Discharge Diagnosis: COPD Exacerbation  Patient and/or caregiver is knowledgeable of his/her condition(s) and treatment: Yes-Spoke with Wife  MEDICATION RECONCILIATION Medication list reviewed with patient:Yes  Patient is able to obtain needed medications:Yes  ACTIVITIES OF DAILY LIVING  Is the patient able to perform his/her own ADLs: Yes.    Patient is receiving home health services: No.  PATIENT EDUCATION Questions/Concerns Discussed: He is improving and was very pleased with his care at [facility]. They will call back if his symptoms worsen or if he doesn't seem to be improving.

## 2015-06-11 ENCOUNTER — Other Ambulatory Visit: Payer: Self-pay | Admitting: Family Medicine

## 2015-06-11 NOTE — Telephone Encounter (Signed)
rx called into pharmacy

## 2015-06-11 NOTE — Telephone Encounter (Signed)
Last seen 05/25/15 DWM   If approved route to nurse to call into [facility]

## 2015-06-12 DIAGNOSIS — J439 Emphysema, unspecified: Secondary | ICD-10-CM | POA: Diagnosis not present

## 2015-06-12 DIAGNOSIS — J449 Chronic obstructive pulmonary disease, unspecified: Secondary | ICD-10-CM | POA: Diagnosis not present

## 2015-06-12 LAB — CULTURE, BLOOD (ROUTINE X 2)
CULTURE: NO GROWTH
CULTURE: NO GROWTH

## 2015-06-23 ENCOUNTER — Encounter: Payer: Self-pay | Admitting: Family Medicine

## 2015-06-23 ENCOUNTER — Telehealth: Payer: Self-pay | Admitting: Internal Medicine

## 2015-06-23 ENCOUNTER — Ambulatory Visit (INDEPENDENT_AMBULATORY_CARE_PROVIDER_SITE_OTHER): Payer: Commercial Managed Care - HMO | Admitting: Family Medicine

## 2015-06-23 VITALS — BP 126/75 | HR 98 | Temp 97.3°F | Ht 69.0 in | Wt 333.0 lb

## 2015-06-23 DIAGNOSIS — J9621 Acute and chronic respiratory failure with hypoxia: Secondary | ICD-10-CM | POA: Diagnosis not present

## 2015-06-23 DIAGNOSIS — Z7901 Long term (current) use of anticoagulants: Secondary | ICD-10-CM

## 2015-06-23 DIAGNOSIS — Z09 Encounter for follow-up examination after completed treatment for conditions other than malignant neoplasm: Secondary | ICD-10-CM

## 2015-06-23 DIAGNOSIS — E1140 Type 2 diabetes mellitus with diabetic neuropathy, unspecified: Secondary | ICD-10-CM

## 2015-06-23 DIAGNOSIS — J9611 Chronic respiratory failure with hypoxia: Secondary | ICD-10-CM | POA: Diagnosis not present

## 2015-06-23 DIAGNOSIS — I482 Chronic atrial fibrillation, unspecified: Secondary | ICD-10-CM

## 2015-06-23 DIAGNOSIS — E119 Type 2 diabetes mellitus without complications: Secondary | ICD-10-CM | POA: Diagnosis not present

## 2015-06-23 DIAGNOSIS — R05 Cough: Secondary | ICD-10-CM | POA: Diagnosis not present

## 2015-06-23 DIAGNOSIS — R053 Chronic cough: Secondary | ICD-10-CM

## 2015-06-23 DIAGNOSIS — I5032 Chronic diastolic (congestive) heart failure: Secondary | ICD-10-CM

## 2015-06-23 DIAGNOSIS — Z794 Long term (current) use of insulin: Secondary | ICD-10-CM | POA: Diagnosis not present

## 2015-06-23 LAB — POCT INR: INR: 2.1

## 2015-06-23 MED ORDER — LEVOFLOXACIN 500 MG PO TABS: 500.0000 mg | ORAL_TABLET | Freq: Every day | ORAL | Status: DC

## 2015-06-23 NOTE — Progress Notes (Signed)
Subjective:    Patient ID: Justin Ruiz, male    DOB: January 27, 1951, 65 y.o.   MRN: [id number]  HPI Patient here today for hospital follow up from [facility]. He was admitted and diagnosis with with COPD on 06/07/15. The patient was admitted to the hospital and regional with a diagnosis of COPD exacerbation and sepsis. He has been on oxygen at home and has an upcoming appointment with the pulmonologist and we will see if we can move that up some. Currently has shortness of breath and weakness and is still coughing up thick yellow sputum. His most recent creatinine was 0.85. An initial pulse ox in the office was 84% without his oxygen on. His pulse ox this morning at home was around 93%. The patient denies any chest pain. He thinks the congestion is becoming thicker. He is currently taking Delsym. He is not taking any Mucinex. He denies any chest pain or GI symptoms. He is finished his doxycycline.     Patient Active Problem List   Diagnosis Date Noted  . Chronic diastolic heart failure ([place])   . CAP (community acquired pneumonia)   . Acute-on-chronic respiratory failure ([place]) 06/07/2015  . Sepsis ([place]) 06/07/2015  . Diabetic neuropathy, type II diabetes mellitus ([place]) 09/25/2014  . Chronic respiratory failure with hypoxia ([place]) 05/25/2014  . Obstructive sleep apnea 05/25/2014  . Benign neoplasm of ascending colon 05/05/2014  . Excessive daytime sleepiness 01/24/2014  . Mediastinal adenopathy 06/20/2013  . Severe obesity (BMI >= 40) ([place]) 05/01/2013  . Generalized anxiety disorder 05/01/2013  . Lung nodules 03/11/2013  . Chronic anticoagulation 08/20/2012  . FIBRILLATION, ATRIAL 05/12/2010  . NHL (non-Hodgkin's lymphoma) ([place]) 01/26/2010  . Obesity, morbid ([place]) 04/27/2009  . CARDIOVASCULAR STUDIES, ABNORMAL 04/27/2009  . ABNORMAL STRESS ELECTROCARDIOGRAM 03/25/2009  . PERSONAL HISTORY OF COLONIC POLYPS 03/02/2009  . Diabetes mellitus type 2, insulin dependent ([place]) 04/29/2007  .  Hyperlipidemia 04/29/2007  . Hypertension 04/29/2007  . Seasonal and perennial allergic rhinitis 04/29/2007  . COPD exacerbation ([place]) 04/29/2007  . G E R D 04/29/2007  . BENIGN PROSTATIC HYPERTROPHY, HX OF 04/29/2007   Outpatient Encounter Prescriptions as of 06/23/2015  Medication Sig  . acetaminophen (TYLENOL) 500 MG tablet Take 1,000 mg by mouth every 6 (six) hours as needed for headache. Reported on 04/15/2015  . albuterol (PROAIR HFA) 108 (90 BASE) MCG/ACT inhaler Inhale 2 puffs into the lungs every 6 (six) hours as needed for wheezing or shortness of breath.  . ALPRAZolam (XANAX) 0.25 MG tablet TAKE ONE TABLET BY MOUTH AT BEDTIME  . Blood Glucose Calibration (TAI DOC CONTROL) NORMAL SOLN Use as directed.  . Blood Glucose Monitoring Suppl (CLEVER CHEK AUTO-CODE VOICE) DEVI USE AS DIRECTED  . budesonide-formoterol (SYMBICORT) 160-4.5 MCG/ACT inhaler Inhale 2 puffs into the lungs 2 (two) times daily.  . Cholecalciferol (VITAMIN D3) 5000 UNITS TABS Take 1 tablet by mouth every morning.  . enalapril (VASOTEC) 20 MG tablet Take 1 tablet (20 mg total) by mouth daily.  . furosemide (LASIX) 20 MG tablet Take 20 mg by mouth daily.  . insulin NPH Human (NOVOLIN N RELION) 100 UNIT/ML injection Inject 0.4 mLs (40 Units total) into the skin 2 (two) times daily before a meal.  . ipratropium-albuterol (DUONEB) 0.5-2.5 (3) MG/3ML SOLN PLACE 1 VIAL (3 MLS) IN THE NEBULIZER EVERY 6 HOURS AS NEEDED  . Lancet Devices (LANCING DEVICE) MISC USE AS DIRECTED  . metFORMIN (GLUCOPHAGE) 500 MG tablet TAKE ONE TABLET BY MOUTH IN THE MORNING  AND TWO IN THE EVENING  . metoprolol (LOPRESSOR) 100 MG tablet TAKE ONE TABLET BY MOUTH TWICE DAILY  . NOVOLIN R RELION 100 UNIT/ML injection Inject 32 Units into the skin 2 (two) times daily before a meal.  . simvastatin (ZOCOR) 40 MG tablet Take 1 tablet (40 mg total) by mouth daily.  Marland Kitchen warfarin (COUMADIN) 5 MG tablet TAKE ONE & ONE-HALF TABS BY PO QD ON  MONDAY AND  FRIDAY &  THEN TAKE 1 TAB THE REST OF THE WEEK  . [DISCONTINUED] dextromethorphan (DELSYM) 30 MG/5ML liquid Take 5 mLs (30 mg total) by mouth every 12 (twelve) hours as needed for cough. (Patient not taking: Reported on 06/23/2015)  . [DISCONTINUED] doxycycline (VIBRA-TABS) 100 MG tablet Take 1 tablet (100 mg total) by mouth every 12 (twelve) hours.  . [DISCONTINUED] predniSONE (DELTASONE) 20 MG tablet Take 3 tablets by mouth daily X 2 days; then 2 tablets by mouth daily X 2 days; then 1 tablet by mouth daily X 3 days; then 1/2 tablet by mouth daily X 3 days and stop prednisone.  . [DISCONTINUED] promethazine-codeine (PHENERGAN WITH CODEINE) 6.25-10 MG/5ML syrup Take 5 mLs by mouth every 6 (six) hours as needed for cough.   No facility-administered encounter medications on file as of 06/23/2015.      Review of Systems  HENT: Positive for congestion (thick yellow).   Eyes: Negative.   Respiratory: Positive for shortness of breath.   Cardiovascular: Negative.   Gastrointestinal: Negative.   Endocrine: Negative.   Genitourinary: Negative.   Musculoskeletal: Negative.   Skin: Negative.   Allergic/Immunologic: Negative.   Neurological: Positive for weakness.  Hematological: Negative.   Psychiatric/Behavioral: Negative.        Objective:   Physical Exam  Constitutional: He is oriented to person, place, and time. He appears well-developed and well-nourished. No distress.  Despite his condition he does not appear to be distressed.  HENT:  Head: Normocephalic and atraumatic.  Right Ear: External ear normal.  Left Ear: External ear normal.  Nose: Nose normal.  Mouth/Throat: Oropharynx is clear and moist. No oropharyngeal exudate.  Eyes: Conjunctivae and EOM are normal. Pupils are equal, round, and reactive to light. Right eye exhibits no discharge. Left eye exhibits no discharge. No scleral icterus.  Neck: Normal range of motion. Neck supple. No thyromegaly present.  Cardiovascular: Normal rate,  normal heart sounds and intact distal pulses.  Exam reveals no gallop and no friction rub.   No murmur heard. The heart was irregular irregular at 84/m  Pulmonary/Chest: Effort normal. No respiratory distress. He has no wheezes. He has no rales. He exhibits no tenderness.  Decreased air movement bilaterally  Musculoskeletal: Normal range of motion. He exhibits edema. He exhibits no tenderness.  1+ pretibial edema  Lymphadenopathy:    He has no cervical adenopathy.  Neurological: He is alert and oriented to person, place, and time.  Skin: Skin is warm and dry. No rash noted. No erythema.  Psychiatric: He has a normal mood and affect. His behavior is normal. Judgment and thought content normal.  Nursing note and vitals reviewed.  BP 126/75 mmHg  Pulse 98  Temp(Src) 97.3 F (36.3 C) (Oral)  Ht 5' 9" (1.753 m)  Wt 333 lb (151.048 kg)  BMI 49.15 kg/m2  SpO2 84%  Repeat pulse ox with oxygen on in the office was 92%      Assessment & Plan:  1. Chronic atrial fibrillation (HCC) -The patient remains in atrial fibrillation and he should continue  with his Coumadin as he is currently doing - POCT INR - BMP8+EGFR - CBC with Differential/Platelet  2. Hospital discharge follow-up -He still coughing up yellow sputum and the last creatinine that was on his record was good so we will start him on Levaquin 500 mg 1 daily and will only change this to a lower dose if his creatinine is elevated. - BMP8+EGFR - CBC with Differential/Platelet  3. Type 2 diabetes mellitus with diabetic neuropathy, with long-term current use of insulin (HCC) -Continue good tight diabetic control and monitoring blood sugars regularly  4. Diabetes mellitus type 2, insulin dependent (HCC)  5. Chronic respiratory failure with hypoxia (HCC) -Follow-up with pulmonary specialist or return to this office in 10 days or return sooner if worse  6. Chronic diastolic heart failure (HCC) -Continue to follow-up with  cardiology  7. Acute on chronic respiratory failure with hypoxia (HCC) -Take antibiotic as directed  8. Persistent cough -Use Mucinex one twice daily maximum strength, blue and white in color with a large glass of water  9. Chronic anticoagulation -Continue current Coumadin regimen  Meds ordered this encounter  Medications  . levofloxacin (LEVAQUIN) 500 MG tablet    Sig: Take 1 tablet (500 mg total) by mouth daily.    Dispense:  7 tablet    Refill:  0   Patient Instructions   We will try to get you in to see Dr. Young as soon as possible to follow up from this recent exacerbation of COPD He should continue to use sure nebulizer treatment and use Symbicort He should drink plenty of fluids and stay well hydrated He should add Mucinex to the current treatment regimen 1 twice daily with a large glass of water Take the antibiotic one daily as directed We will call back with the lab work results as soon as they become available    Anticoagulation Dose Instructions as of 06/23/2015      Sun Mon Tue Wed Thu Fri Sat   New Dose 5 mg 7.5 mg 5 mg 5 mg 5 mg 5 mg 5 mg    Description        No warfarin today - Monday, January 30th, then decrease warfarin dose to  7.5mg mondays only and 5mg all other days.        Don W. Moore MD   

## 2015-06-23 NOTE — Patient Instructions (Addendum)
We will try to get you in to see Dr. Annamaria Boots as soon as possible to follow up from this recent exacerbation of COPD He should continue to use sure nebulizer treatment and use Symbicort He should drink plenty of fluids and stay well hydrated He should add Mucinex to the current treatment regimen 1 twice daily with a large glass of water Take the antibiotic one daily as directed We will call back with the lab work results as soon as they become available    Anticoagulation Dose Instructions as of 06/23/2015      Sun Mon Tue Wed Thu Fri Sat   New Dose 5 mg 7.5 mg 5 mg 5 mg 5 mg 5 mg 5 mg    Description        No warfarin today - Monday, January 30th, then decrease warfarin dose to  7.5mg  mondays only and 5mg  all other days.     PATIENT TO START LEVAQUIN ON Thursday

## 2015-06-23 NOTE — Telephone Encounter (Signed)
Spoke with pt;Pt has been scheduled for Thursday 07-02-15 at 4:15pm to see CY for HFU. Nothing more needed at this time.

## 2015-06-24 LAB — CBC WITH DIFFERENTIAL/PLATELET
Basophils Absolute: 0 10*3/uL (ref 0.0–0.2)
Basos: 0 %
EOS (ABSOLUTE): 0.1 10*3/uL (ref 0.0–0.4)
EOS: 2 %
HEMATOCRIT: 41.6 % (ref 37.5–51.0)
HEMOGLOBIN: 13.5 g/dL (ref 12.6–17.7)
Immature Grans (Abs): 0 10*3/uL (ref 0.0–0.1)
Immature Granulocytes: 0 %
LYMPHS ABS: 1.6 10*3/uL (ref 0.7–3.1)
Lymphs: 23 %
MCH: 30.6 pg (ref 26.6–33.0)
MCHC: 32.5 g/dL (ref 31.5–35.7)
MCV: 94 fL (ref 79–97)
MONOCYTES: 8 %
Monocytes Absolute: 0.5 10*3/uL (ref 0.1–0.9)
NEUTROS ABS: 4.8 10*3/uL (ref 1.4–7.0)
Neutrophils: 67 %
Platelets: 164 10*3/uL (ref 150–379)
RBC: 4.41 x10E6/uL (ref 4.14–5.80)
RDW: 13.8 % (ref 12.3–15.4)
WBC: 7.1 10*3/uL (ref 3.4–10.8)

## 2015-06-24 LAB — BMP8+EGFR
BUN / CREAT RATIO: 18 (ref 10–22)
BUN: 18 mg/dL (ref 8–27)
CO2: 32 mmol/L — ABNORMAL HIGH (ref 18–29)
CREATININE: 1.02 mg/dL (ref 0.76–1.27)
Calcium: 8.7 mg/dL (ref 8.6–10.2)
Chloride: 92 mmol/L — ABNORMAL LOW (ref 96–106)
GFR calc non Af Amer: 77 mL/min/{1.73_m2} (ref >59)
GFR, EST AFRICAN AMERICAN: 89 mL/min/{1.73_m2} (ref >59)
Glucose: 232 mg/dL — ABNORMAL HIGH (ref 65–99)
Potassium: 4.6 mmol/L (ref 3.5–5.2)
Sodium: 141 mmol/L (ref 134–144)

## 2015-06-25 ENCOUNTER — Encounter: Payer: Self-pay | Admitting: Pharmacist

## 2015-06-29 ENCOUNTER — Ambulatory Visit (INDEPENDENT_AMBULATORY_CARE_PROVIDER_SITE_OTHER): Payer: Commercial Managed Care - HMO | Admitting: Pharmacist

## 2015-06-29 ENCOUNTER — Other Ambulatory Visit: Payer: Self-pay | Admitting: Pharmacist

## 2015-06-29 DIAGNOSIS — I482 Chronic atrial fibrillation, unspecified: Secondary | ICD-10-CM

## 2015-06-29 DIAGNOSIS — Z7901 Long term (current) use of anticoagulants: Secondary | ICD-10-CM

## 2015-06-29 LAB — COAGUCHEK XS/INR WAIVED
INR: 2.8 — AB (ref 0.9–1.1)
Prothrombin Time: 33.5 s

## 2015-06-29 NOTE — Patient Instructions (Signed)
Anticoagulation Dose Instructions as of 06/29/2015      Dorene Grebe Tue Wed Thu Fri Sat   New Dose 5 mg 7.5 mg 5 mg 5 mg 5 mg 5 mg 5 mg    Description        Continue current warfarin dose of 7.5mg  mondays only and 5mg  all other days.     INR was 2.8 today

## 2015-07-02 ENCOUNTER — Ambulatory Visit (INDEPENDENT_AMBULATORY_CARE_PROVIDER_SITE_OTHER): Payer: Commercial Managed Care - HMO | Admitting: Internal Medicine

## 2015-07-02 ENCOUNTER — Encounter: Payer: Self-pay | Admitting: Internal Medicine

## 2015-07-02 VITALS — BP 110/72 | HR 82 | Ht 69.0 in | Wt 333.4 lb

## 2015-07-02 DIAGNOSIS — J302 Other seasonal allergic rhinitis: Secondary | ICD-10-CM

## 2015-07-02 DIAGNOSIS — J309 Allergic rhinitis, unspecified: Secondary | ICD-10-CM | POA: Diagnosis not present

## 2015-07-02 DIAGNOSIS — J9611 Chronic respiratory failure with hypoxia: Secondary | ICD-10-CM | POA: Diagnosis not present

## 2015-07-02 DIAGNOSIS — J3089 Other allergic rhinitis: Secondary | ICD-10-CM

## 2015-07-02 DIAGNOSIS — G4733 Obstructive sleep apnea (adult) (pediatric): Secondary | ICD-10-CM

## 2015-07-02 DIAGNOSIS — J441 Chronic obstructive pulmonary disease with (acute) exacerbation: Secondary | ICD-10-CM

## 2015-07-02 NOTE — Assessment & Plan Note (Signed)
Suggested OTC nonsedating antihistamine and Flonase. This shouldn't interfere with his other medications or his heart disease.

## 2015-07-02 NOTE — Assessment & Plan Note (Signed)
He had his wife indicate that he is comfortable and compliant with CPAP. He sleeps better using it and won't sleep without it.

## 2015-07-02 NOTE — Assessment & Plan Note (Signed)
Has recovered to baseline after recent hospitalization which was caused by acute exacerbation of COPD (probably viral) plus some degree of his CHF.

## 2015-07-02 NOTE — Progress Notes (Signed)
12/02/11- 62 yoM former smoker followed for COPD, OSA, complicated by hx lymphoma, AFib/ coumadin PFT 11/23/06- 0.47.   Hx 35 pk yr cigs.  LOV- 06/28/10  Patient states doing "pretty good". c/o sob with exertion. denies chest pain, chest tightness, wheezing, and cough. DOE especially on stairs- no change. Uses neb 1-2x/ yr. Seasonal pollens bother some.  OSA status not addressed this visit. COPD Assessment Test( CAT) 16/24  01/09/12-  61 yoM former smoker followed for COPD, OSA, complicated by hx lymphoma, AFib/ coumadin ACUTE VISIT: unable to lay flat-not able to breathe; Increased SOB and wheezing-getting worse; breathing tx at 4am and used rescue inhaler as well; has been going on since yesterday. Wife here On arrival today o2 sat 80, responded to 3 L O2. COPD assessment test (CAT) 40/40. Did well at [facility] on vacation without recognized fluid retention or other acute event. Home over the past week- Gradually worse shortness of breath x2 days with increased cough, watery rhinorrhea. Rattling chest congestion x2 days. Denies fever, pain, green, blood, sore throat. Wife gave antihistamine yesterday. CT chest 11/01/11-reviewed with them IMPRESSION:  1. New airspace opacity in the left lower lobe - pneumonia or  pulmonary hemorrhage could give a similar appearance. Follow-up  chest radiography to ensure clearance is recommended.  2. Several small right lung nodules are stable and nonspecific due  to small size.  3. Mediastinal lymph nodes are slightly increased in size compared  the prior exam, but currently within normal size limits.  4. Atherosclerosis.   01/22/13- 61 yoM former smoker followed for COPD, OSA, complicated by hx lymphoma, AFib/ coumadin pt reports breathing is doing well-- denies any other concerns at this time Schedule for vaccine next week. Denies cough wheeze or active concern. Seasonal rhinitis with watery nose. Rescue inhaler sometimes  once a day, currently 2 or 3 times per  week. Denies any concerns about potential cough relation enalapril-discussed CXR 09/03/12 IMPRESSION:  No acute or metastatic cardiopulmonary abnormality.  rec Refills for rescue inhaler and nebulizer  06/26/2013 pulmonary consultation /Wert re: ? Recurrent lymphoma vs ca Chief Complaint  Patient presents with  . Follow-up    Pt here at the request of Dr. Beryle Beams for eval of PET and possible Bronch. Pt reports having increased DOE for the past month.  doe indolent onset x steps, flat ok - no cough on ACEi  Has duoneb/ saba hfa but hasn't tried them before desired activity   12/09/13-  61 yoM former smoker followed for COPD, OSA, complicated by hx lymphoma, DM , AFib/ coumadin, morbid obesity FOLLOWS FOR: Increased SOB over the past month; wheezing at times, sats dropping in 80's(has noticed this more at night time) He no longer has home oxygen. O2 sat here dropped to 86% at rest standing on room air today on arrival. He had been needing to use nebulizer more regularly for chest tightness before leaving for beach, and has not improved. Denies acute infection, chest pain, andkle edema or bleeding. Using home nebulizer with DuoNeb. Lymphoma being followed by oncology with CT scan. Nodules are reported smaller. CT chest 09/06/13 IMPRESSION:  1. Mediastinal and right hilar lymphadenopathy identified on the  recent PET-CT has decreased in size in the interval since  06/19/2013.  2. Scattered bilateral small pulmonary nodules. 1 of the nodules in  the right middle lobe measures minimally larger on today's study but  the remaining nodules are all stable. No new pulmonary nodule is  evident.  3. Stable small hepatoduodenal  ligament lymph nodes. No change in  the retroperitoneal aortocaval lymph node measured previously  comparing back to the study from 03/04/2013. No new or progressive  nodal disease in the abdomen or pelvis.  Electronically Signed  By: [name] Stanley M.D.  On: 09/06/2013  09:35  *Not clear why he has never had sleep study- first priority is to address O2 needs, but will need to address OSA next visit* Also needs update PFT  01/24/2014 Follow up  Returns for 6 week follow up for COPD flare  Tx w/ Depo Medrol shot last ov and started on BREO . Does not want to continue BREO says he does not feel he needs it.  Has duoneb at home but does not use it.  No recent PFt  Started on O2 last ov for persistent desats.  Pt has CT chest next month for follow up for known lung nodules.  Says he is feeling much better , stopped his Oxygen . We  Discussed need to wear at all times. O2 sats 83% on RA on arrival to office , at rest 90% on RA . Of note on ACEI, denies cough.  Has daytime sleepiness, snoring , never had a sleep study in past.   05/22/14-63 yoM former smoker followed for COPD/ chronic hypoxic resp failure, OSA, complicated by hx lymphoma, DM , AFib/ coumadin, morbid obesity FOLLOWS FOR: Review sleep study with patient. NPSG-  04/07/2014-severe obstructive sleep apnea-AHI 75.1 per hour. CPAP titrated to 16. He required supplemental oxygen at 2 L/ [employer] which he already wears for sleep at home. Weight 338 pounds Dr. Earlie Server continues following for oncology/lymphoma CT chest 03/17/14-images reviewed IMPRESSION:  CT CHEST IMPRESSION  1. Similar to slight improvement in subpleural pulmonary nodules,  favored to represent benign subpleural lymph nodes. The right middle  lobe nodule described on the prior exam has resolved.  2. No acute process or evidence of active lymphoma within the chest.  3. Atherosclerosis, including within the coronary arteries.  CT ABDOMEN AND PELVIS IMPRESSION  1. Similar retrocaval adenopathy.  2. No evidence of new or progressive adenopathy within the abdomen  or pelvis.  3. Cholelithiasis.  Electronically Signed  By: Abigail Miyamoto M.D.  On: 03/17/2014 11:14  07/21/14- 81 yoM former smoker followed for COPD/ chronic hypoxic resp  failure, OSA, complicated by hx lymphoma, DM , AFib/ coumadin, morbid obesity FOLLOWS FOR states wearing cpap 5-7 hours a night. Denies any problems with pressure or equipment  CPAP auto 12-20/ [employer]  01/21/15-64 yoM former smoker followed for COPD/ chronic hypoxic resp failure, OSA, complicated by hx lymphoma, DM , AFib/ coumadin, morbid obesity CPAP auto 12-20/ [employer]   O2 2L continuous FOLLOWS UP: pt. states he wears mask every night 4-6 hrs feels it helps him. DME IS [employer] CPAP "is my best buddy". We reviewed download showing room for improvement in usage hours and he will work on that. Control is good. He is satisfied to continue present pressure range. Prefers Symbicort over powder inhalers. Occasional shortness of breath with little wheeze, controlled with current meds. Using rescue inhaler once or twice daily especially if outdoors with weather change or ragweed. Denies wheeze causing sleep disturbance.  07/02/2015-65 year old male former smoker followed for COPD/chronic hypoxic respiratory failure, OSA, complicated by history lymphoma, DM, A. fib/Coumadin, morbid obesity CPAP auto 12-20/ [employer]   O2 2L continuous FOLLOWS FOR: Per Dr Laurance Flatten; was in Hospital at [facility] in middle of Feb 2017.  Hospital discharge 06/09/2015-acute on chronic respiratory failure due  to CPAP/bronchitis, exacerbation COPD, rapid A. fib when he got off of his beta blocker.Marland Kitchen Has finished Levaquin He says he is back to "baseline". He has been given a diuretic. Does notice seasonal rhinitis with congestion and drainage and asks treatment recommendations. CT chest 04/09/2015 IMPRESSION: 1. Tiny scattered sub 4 mm pulmonary nodules are stable. 2. No adenopathy in the chest. 3. Stable retroperitoneal lymph node. No new or progressive lymph nodes in the abdomen/pelvis. 4. Stable sclerotic and slightly enlarged right tenth rib as discussed above. Electronically Signed  By: Marijo Sanes M.D.  On: 04/09/2015  11: CXR 06/07/2015 1 V IMPRESSION: Vascular congestion noted. Increased interstitial markings may reflect mild pulmonary edema. Electronically Signed  By: Garald Balding M.D.  On: 06/07/2015 02:01  ROS-see HPI Constitutional:   No-   weight loss, night sweats, fevers, chills, +fatigue, lassitude. HEENT:   No-  headaches, difficulty swallowing, tooth/dental problems, sore throat,       No-  sneezing, itching, ear ache, nasal congestion, post nasal drip,  CV:  No-   chest pain, orthopnea, PND, swelling in lower extremities, anasarca,                                                             dizziness, palpitations Resp: + shortness of breath with exertion or at rest.              No-   productive cough,  No non-productive cough,  No- coughing up of blood.              No-   change in color of mucus.   Skin: No-   rash or lesions. GI:  No-   heartburn, indigestion, abdominal pain, nausea, vomiting,  GU:  MS:  No-   joint pain or swelling.  . Neuro-     nothing unusual Psych:  No- change in mood or affect. No depression or anxiety.  No memory loss.  OBJ- Physical Exam General- Alert, Oriented, Affect-appropriate, Distress- none acute, +morbidly obese Skin- rash-none, lesions- none, excoriation- none Lymphadenopathy- none Head- atraumatic            Eyes- Gross vision intact, PERRLA, conjunctivae and secretions clear, + dark circles            Ears- Hearing, canals-normal            Nose- Clear, no-Septal dev, mucus, polyps, erosion, perforation             Throat- Mallampati II , mucosa clear , drainage- none, tonsils- atrophic Neck- flexible , trachea midline, no stridor , thyroid nl, carotid no bruit Chest - symmetrical excursion , unlabored           Heart/CV- IIR , no murmur , no gallop  , no rub, nl s1 s2                           - JVD- none , edema- none, stasis changes- none, varices- none           Lung- decreased airflow/clear, unlabored, wheeze-none none ,  dullness-none, rub- none           Chest wall-  Abd-  Br/ Gen/ Rectal- Not done, not indicated Extrem- cyanosis- none, clubbing, none, atrophy- none,  strength- nl Neuro- grossly intact to observation

## 2015-07-02 NOTE — Patient Instructions (Signed)
Short term- you can turn oxygen up as needed to keep saturation over 88%. If it stays low, then you need to be seen at office or ER to get the problem fixed.  Ok to continue your CPAP and oxygen, along with current meds.  For spring pollen allergy- try an otc antihistamine like claritin, allegra or zyrtec (generic/ store brand is fine)                             You can also use an otc steroid allergy nasal spray like Flonase/ fluticasone  Please call as needed

## 2015-07-02 NOTE — Assessment & Plan Note (Signed)
He remains oxygen dependent 

## 2015-07-03 ENCOUNTER — Ambulatory Visit: Payer: Commercial Managed Care - HMO | Admitting: Family Medicine

## 2015-07-07 DIAGNOSIS — J9611 Chronic respiratory failure with hypoxia: Secondary | ICD-10-CM | POA: Diagnosis not present

## 2015-07-07 DIAGNOSIS — J449 Chronic obstructive pulmonary disease, unspecified: Secondary | ICD-10-CM | POA: Diagnosis not present

## 2015-07-10 DIAGNOSIS — J439 Emphysema, unspecified: Secondary | ICD-10-CM | POA: Diagnosis not present

## 2015-07-10 DIAGNOSIS — J449 Chronic obstructive pulmonary disease, unspecified: Secondary | ICD-10-CM | POA: Diagnosis not present

## 2015-07-20 ENCOUNTER — Other Ambulatory Visit: Payer: Self-pay | Admitting: Family Medicine

## 2015-07-27 ENCOUNTER — Other Ambulatory Visit: Payer: Self-pay | Admitting: Family Medicine

## 2015-07-27 DIAGNOSIS — D509 Iron deficiency anemia, unspecified: Secondary | ICD-10-CM | POA: Diagnosis not present

## 2015-07-27 DIAGNOSIS — N183 Chronic kidney disease, stage 3 (moderate): Secondary | ICD-10-CM | POA: Diagnosis not present

## 2015-07-27 DIAGNOSIS — Z79899 Other long term (current) drug therapy: Secondary | ICD-10-CM | POA: Diagnosis not present

## 2015-07-27 DIAGNOSIS — E559 Vitamin D deficiency, unspecified: Secondary | ICD-10-CM | POA: Diagnosis not present

## 2015-07-27 DIAGNOSIS — I10 Essential (primary) hypertension: Secondary | ICD-10-CM | POA: Diagnosis not present

## 2015-07-27 DIAGNOSIS — R809 Proteinuria, unspecified: Secondary | ICD-10-CM | POA: Diagnosis not present

## 2015-07-29 DIAGNOSIS — E559 Vitamin D deficiency, unspecified: Secondary | ICD-10-CM | POA: Diagnosis not present

## 2015-07-29 DIAGNOSIS — I10 Essential (primary) hypertension: Secondary | ICD-10-CM | POA: Diagnosis not present

## 2015-07-29 DIAGNOSIS — R809 Proteinuria, unspecified: Secondary | ICD-10-CM | POA: Diagnosis not present

## 2015-07-30 ENCOUNTER — Encounter: Payer: Self-pay | Admitting: Pharmacist

## 2015-07-31 ENCOUNTER — Encounter: Payer: Self-pay | Admitting: Family Medicine

## 2015-08-03 ENCOUNTER — Other Ambulatory Visit: Payer: Self-pay | Admitting: Family Medicine

## 2015-08-07 DIAGNOSIS — J9611 Chronic respiratory failure with hypoxia: Secondary | ICD-10-CM | POA: Diagnosis not present

## 2015-08-07 DIAGNOSIS — J449 Chronic obstructive pulmonary disease, unspecified: Secondary | ICD-10-CM | POA: Diagnosis not present

## 2015-08-10 DIAGNOSIS — J449 Chronic obstructive pulmonary disease, unspecified: Secondary | ICD-10-CM | POA: Diagnosis not present

## 2015-08-10 DIAGNOSIS — J439 Emphysema, unspecified: Secondary | ICD-10-CM | POA: Diagnosis not present

## 2015-08-12 ENCOUNTER — Ambulatory Visit (INDEPENDENT_AMBULATORY_CARE_PROVIDER_SITE_OTHER): Payer: Commercial Managed Care - HMO | Admitting: Pharmacist

## 2015-08-12 DIAGNOSIS — I482 Chronic atrial fibrillation, unspecified: Secondary | ICD-10-CM

## 2015-08-12 DIAGNOSIS — Z7901 Long term (current) use of anticoagulants: Secondary | ICD-10-CM | POA: Diagnosis not present

## 2015-08-12 LAB — COAGUCHEK XS/INR WAIVED
INR: 2.5 — AB (ref 0.9–1.1)
PROTHROMBIN TIME: 29.7 s

## 2015-08-12 NOTE — Patient Instructions (Signed)
Anticoagulation Dose Instructions as of 08/12/2015      Justin Ruiz Tue Wed Thu Fri Sat   New Dose 5 mg 7.5 mg 5 mg 5 mg 5 mg 5 mg 5 mg    Description        Continue current warfarin dose of 7.5mg  mondays only and 5mg  all other days.     INR was 2.5 today

## 2015-08-22 ENCOUNTER — Other Ambulatory Visit: Payer: Self-pay | Admitting: Family Medicine

## 2015-08-24 NOTE — Telephone Encounter (Signed)
  Last seen 06/23/15  DWM  If approved route to nurse to call into [facility]

## 2015-09-02 ENCOUNTER — Encounter: Payer: Self-pay | Admitting: Gastroenterology

## 2015-09-06 DIAGNOSIS — J449 Chronic obstructive pulmonary disease, unspecified: Secondary | ICD-10-CM | POA: Diagnosis not present

## 2015-09-06 DIAGNOSIS — J9611 Chronic respiratory failure with hypoxia: Secondary | ICD-10-CM | POA: Diagnosis not present

## 2015-09-07 DIAGNOSIS — G4733 Obstructive sleep apnea (adult) (pediatric): Secondary | ICD-10-CM | POA: Diagnosis not present

## 2015-09-09 DIAGNOSIS — J449 Chronic obstructive pulmonary disease, unspecified: Secondary | ICD-10-CM | POA: Diagnosis not present

## 2015-09-09 DIAGNOSIS — J439 Emphysema, unspecified: Secondary | ICD-10-CM | POA: Diagnosis not present

## 2015-09-17 ENCOUNTER — Ambulatory Visit (INDEPENDENT_AMBULATORY_CARE_PROVIDER_SITE_OTHER): Payer: Commercial Managed Care - HMO

## 2015-09-17 ENCOUNTER — Encounter: Payer: Self-pay | Admitting: Family Medicine

## 2015-09-17 ENCOUNTER — Ambulatory Visit (INDEPENDENT_AMBULATORY_CARE_PROVIDER_SITE_OTHER): Payer: Commercial Managed Care - HMO | Admitting: Family Medicine

## 2015-09-17 VITALS — BP 133/77 | HR 57 | Temp 99.5°F | Ht 69.0 in | Wt 330.4 lb

## 2015-09-17 DIAGNOSIS — M25562 Pain in left knee: Secondary | ICD-10-CM

## 2015-09-17 MED ORDER — METHYLPREDNISOLONE ACETATE 80 MG/ML IJ SUSP: 40.0000 mg | Freq: Once | INTRAMUSCULAR | Status: DC

## 2015-09-17 NOTE — Progress Notes (Addendum)
BP 133/77 mmHg  Pulse 57  Temp(Src) 99.5 F (37.5 C) (Oral)  Ht '5\' 9"'  (1.753 m)  Wt 330 lb 6.4 oz (149.868 kg)  BMI 48.77 kg/m2   Subjective:    Patient ID: Justin Ruiz, male    DOB: 05/06/1950, 65 y.o.   MRN: [id number]  HPI: Justin Ruiz is a 65 y.o. male presenting on 09/17/2015 for Knee Pain and Back Pain   HPI Knee pain left Patient comes in today with left acute knee pain. He says he was ambulating in the grass and slipped because it was wet and twisted his knee and since then he has been having significant pain and swelling in that knee. Patient denies any fevers or chills or erythema or warmth. He is having pain more on the medial joint line. He denies any popping or catching or giving way of that knee.  Relevant past medical, surgical, family and social history reviewed and updated as indicated. Interim medical history since our last visit reviewed. Allergies and medications reviewed and updated.  Review of Systems  Constitutional: Negative for fever and chills.  HENT: Negative for ear discharge and ear pain.   Eyes: Negative for discharge and visual disturbance.  Respiratory: Negative for shortness of breath and wheezing.   Cardiovascular: Negative for chest pain and leg swelling.  Gastrointestinal: Negative for abdominal pain, diarrhea and constipation.  Genitourinary: Negative for difficulty urinating.  Musculoskeletal: Positive for joint swelling, arthralgias and gait problem. Negative for back pain.  Skin: Negative for rash.  Neurological: Negative for syncope, light-headedness and headaches.  All other systems reviewed and are negative.   Per HPI unless specifically indicated above     Medication List       This list is accurate as of: 09/17/15 10:47 AM.  Always use your most recent med list.               acetaminophen 500 MG tablet  Commonly known as:  TYLENOL  Take 1,000 mg by mouth every 6 (six) hours as needed for headache. Reported on  04/15/2015     albuterol 108 (90 Base) MCG/ACT inhaler  Commonly known as:  PROAIR HFA  Inhale 2 puffs into the lungs every 6 (six) hours as needed for wheezing or shortness of breath.     ALPRAZolam 0.25 MG tablet  Commonly known as:  XANAX  TAKE ONE TABLET BY MOUTH AT BEDTIME     budesonide-formoterol 160-4.5 MCG/ACT inhaler  Commonly known as:  SYMBICORT  Inhale 2 puffs into the lungs 2 (two) times daily.     CLEVER CHEK AUTO-CODE VOICE Devi  USE AS DIRECTED     enalapril 20 MG tablet  Commonly known as:  VASOTEC  Take 1 tablet (20 mg total) by mouth daily.     furosemide 20 MG tablet  Commonly known as:  LASIX  Take 20 mg by mouth daily.     ipratropium-albuterol 0.5-2.5 (3) MG/3ML Soln  Commonly known as:  DUONEB  PLACE 1 VIAL (3 MLS) IN THE NEBULIZER EVERY 6 HOURS AS NEEDED     Lancing Device Misc  USE AS DIRECTED     metFORMIN 500 MG tablet  Commonly known as:  GLUCOPHAGE  TAKE ONE TABLET BY MOUTH IN THE MORNING AND TWO IN THE EVENING     metoprolol 100 MG tablet  Commonly known as:  LOPRESSOR  TAKE ONE TABLET BY MOUTH TWICE DAILY     NOVOLIN R RELION 100 units/mL injection  Generic drug:  insulin regular  Inject 32 Units into the skin 2 (two) times daily before a meal.     simvastatin 40 MG tablet  Commonly known as:  ZOCOR  TAKE ONE TABLET BY MOUTH ONCE DAILY     TAI DOC CONTROL Normal Soln  Use as directed.     Vitamin D3 5000 units Tabs  Take 1 tablet by mouth every morning.     warfarin 5 MG tablet  Commonly known as:  COUMADIN  TAKE ONE & ONE-HALF TABLETS BY MOUTH ONCE DAILY EVERY MONDAY AND EVERY FRIDAY THEN TAKE ONE  TABLET THE REST OF THE WEEK           Objective:    BP 133/77 mmHg  Pulse 57  Temp(Src) 99.5 F (37.5 C) (Oral)  Ht '5\' 9"'  (1.753 m)  Wt 330 lb 6.4 oz (149.868 kg)  BMI 48.77 kg/m2  Wt Readings from Last 3 Encounters:  09/17/15 330 lb 6.4 oz (149.868 kg)  07/02/15 333 lb 6.4 oz (151.229 kg)  06/23/15 333 lb (151.048  kg)    Physical Exam  Constitutional: He is oriented to person, place, and time. He appears well-developed and well-nourished. No distress.  Eyes: Conjunctivae and EOM are normal. Pupils are equal, round, and reactive to light. Right eye exhibits no discharge. No scleral icterus.  Neck: Neck supple. No thyromegaly present.  Cardiovascular: Normal rate, regular rhythm, normal heart sounds and intact distal pulses.   No murmur heard. Pulmonary/Chest: Effort normal and breath sounds normal. No respiratory distress. He has no wheezes.  Musculoskeletal: Normal range of motion. He exhibits no edema.       Left knee: He exhibits swelling and effusion. He exhibits normal range of motion, no deformity, normal alignment, no LCL laxity, normal patellar mobility, normal meniscus and no MCL laxity. Tenderness found. Medial joint line (Worse on medial joint line) and lateral joint line tenderness noted. No MCL and no LCL tenderness noted.  Lymphadenopathy:    He has no cervical adenopathy.  Neurological: He is alert and oriented to person, place, and time. Coordination normal.  Skin: Skin is warm and dry. No rash noted. He is not diaphoretic.  Psychiatric: He has a normal mood and affect. His behavior is normal.  Nursing note and vitals reviewed.  Left knee x-ray: X-ray of knee shows no acute bony abnormalities but patient does have significant arthritis in both legs with narrowing of the compartment that is worse on the left lateral knee.  Knee injection: Risk factors of bleeding and infection discussed with patient and patient is agreeable towards injection. Patient prepped with Betadine. Lateral approach towards injection used. Injected 40 mg of Depo-Medrol and 1 mL of 2% lidocaine. Patient tolerated procedure well and no side effects from noted. Minimal to no bleeding. Simple bandage applied after.     Assessment & Plan:   Problem List Items Addressed This Visit    None    Visit Diagnoses    Knee  pain, acute, left    -  Primary    Relevant Medications    methylPREDNISolone acetate (DEPO-MEDROL) injection 40 mg (Start on 09/17/2015  4:00 PM)    Other Relevant Orders    DG Knee 1-2 Views Left (Completed)        Follow up plan: Return if symptoms worsen or fail to improve.  Counseling provided for all of the vaccine components Orders Placed This Encounter  Procedures  . DG Knee 1-2 Views Left  Caryl Pina, MD [facility] 09/17/2015, 10:47 AM

## 2015-09-17 NOTE — Addendum Note (Signed)
Addended by: Caryl Pina on: 09/17/2015 03:50 PM   Modules accepted: Orders, SmartSet

## 2015-09-29 ENCOUNTER — Ambulatory Visit (INDEPENDENT_AMBULATORY_CARE_PROVIDER_SITE_OTHER): Payer: Commercial Managed Care - HMO

## 2015-09-29 ENCOUNTER — Ambulatory Visit (INDEPENDENT_AMBULATORY_CARE_PROVIDER_SITE_OTHER): Payer: Commercial Managed Care - HMO | Admitting: Family Medicine

## 2015-09-29 ENCOUNTER — Encounter: Payer: Self-pay | Admitting: Family Medicine

## 2015-09-29 VITALS — BP 138/84 | HR 83 | Temp 97.0°F | Ht 69.0 in | Wt 331.0 lb

## 2015-09-29 DIAGNOSIS — M25552 Pain in left hip: Secondary | ICD-10-CM | POA: Diagnosis not present

## 2015-09-29 DIAGNOSIS — M545 Low back pain: Secondary | ICD-10-CM

## 2015-09-29 DIAGNOSIS — J189 Pneumonia, unspecified organism: Secondary | ICD-10-CM | POA: Diagnosis not present

## 2015-09-29 DIAGNOSIS — M25562 Pain in left knee: Secondary | ICD-10-CM

## 2015-09-29 DIAGNOSIS — Z7901 Long term (current) use of anticoagulants: Secondary | ICD-10-CM

## 2015-09-29 DIAGNOSIS — Z794 Long term (current) use of insulin: Secondary | ICD-10-CM | POA: Diagnosis not present

## 2015-09-29 DIAGNOSIS — E785 Hyperlipidemia, unspecified: Secondary | ICD-10-CM | POA: Diagnosis not present

## 2015-09-29 DIAGNOSIS — I5032 Chronic diastolic (congestive) heart failure: Secondary | ICD-10-CM

## 2015-09-29 DIAGNOSIS — J9611 Chronic respiratory failure with hypoxia: Secondary | ICD-10-CM | POA: Diagnosis not present

## 2015-09-29 DIAGNOSIS — E119 Type 2 diabetes mellitus without complications: Secondary | ICD-10-CM

## 2015-09-29 DIAGNOSIS — I482 Chronic atrial fibrillation, unspecified: Secondary | ICD-10-CM

## 2015-09-29 DIAGNOSIS — I10 Essential (primary) hypertension: Secondary | ICD-10-CM

## 2015-09-29 DIAGNOSIS — E1140 Type 2 diabetes mellitus with diabetic neuropathy, unspecified: Secondary | ICD-10-CM | POA: Diagnosis not present

## 2015-09-29 DIAGNOSIS — K219 Gastro-esophageal reflux disease without esophagitis: Secondary | ICD-10-CM | POA: Diagnosis not present

## 2015-09-29 DIAGNOSIS — E559 Vitamin D deficiency, unspecified: Secondary | ICD-10-CM

## 2015-09-29 DIAGNOSIS — C8332 Diffuse large B-cell lymphoma, intrathoracic lymph nodes: Secondary | ICD-10-CM

## 2015-09-29 LAB — BAYER DCA HB A1C WAIVED: HB A1C: 7.3 % — AB (ref <7.0)

## 2015-09-29 LAB — COAGUCHEK XS/INR WAIVED
INR: 2.5 — AB (ref 0.9–1.1)
PROTHROMBIN TIME: 30.1 s

## 2015-09-29 NOTE — Addendum Note (Signed)
Addended by: Zannie Cove on: 09/29/2015 10:03 AM   Modules accepted: Orders

## 2015-09-29 NOTE — Patient Instructions (Addendum)
Anticoagulation Dose Instructions as of 09/29/2015      Dorene Grebe Tue Wed Thu Fri Sat   New Dose 5 mg 7.5 mg 5 mg 5 mg 5 mg 5 mg 5 mg    Description        Continue current warfarin dose of 7.5mg  mondays only and 5mg  all other days.     INR was 2.5 today                       Medicare Annual Wellness Visit  [facility] and the medical providers at [facility] strive to bring you the best medical care.  In doing so we not only want to address your current medical conditions and concerns but also to detect new conditions early and prevent illness, disease and health-related problems.    Medicare offers a yearly Wellness Visit which allows our clinical staff to assess your need for preventative services including immunizations, lifestyle education, counseling to decrease risk of preventable diseases and screening for fall risk and other medical concerns.    This visit is provided free of charge (no copay) for all Medicare recipients. The clinical pharmacists at [facility] have begun to conduct these Wellness Visits which will also include a thorough review of all your medications.    As you primary medical provider recommend that you make an appointment for your Annual Wellness Visit if you have not done so already this year.  You may set up this appointment before you leave today or you may call back [phone]) and schedule an appointment.  Please make sure when you call that you mention that you are scheduling your Annual Wellness Visit with the clinical pharmacist so that the appointment may be made for the proper length of time.     Continue current medications. Continue good therapeutic lifestyle changes which include good diet and exercise. Fall precautions discussed with patient. If an FOBT was given today- please return it to our front desk. If you are over 62 years old - you may need Prevnar 27 or the adult Pneumonia vaccine.  **Flu shots  are available--- please call and schedule a FLU-CLINIC appointment**  After your visit with [place] today you will receive a survey in the mail or online from [employer] regarding your care with [place]. Please take a moment to fill this out. Your feedback is very important to [place] as you can help [place] better understand your patient needs as well as improve your experience and satisfaction. WE CARE ABOUT YOU!!!   Use warm wet compresses to the back and the knee 20 minutes 3 or 4 times daily Continue to try to stay active with walking Continue to be careful and not put yourself at risk for falling We will arrange for you to see the orthopedic specialist that comes to our office and he can further evaluate your left knee and back pain Continue to work on losing weight by watching what you eat more closely Drink more water and continue to follow-up with Dr. Earlie Server, Dr. Annamaria Boots, the cardiologist, and the nephrologist. Please call my eye doctor and schedule eye exam and make sure that we get a copy of the report

## 2015-09-29 NOTE — Progress Notes (Signed)
Subjective:    Patient ID: Justin Ruiz, male    DOB: May 26, 1950, 65 y.o.   MRN: [id number]  HPI Pt here for follow up and management of chronic medical problems which includes diabetes, a fib, hyperlipidemia and hypertension. He is taking medications regularly.The patient complains today of some left knee pain and low back pain secondary to a fall. He was seen recently by one of the providers here in the practice and given an injection into his left knee. He is requesting refills on metformin and metoprolol. He will need to get a ProTime today and lab work today. His INR was 2.5 and he will continue with his current dose of Coumadin. The patient fell a couple weeks ago. He slipped up on some mud or wet surface and his feet went out from under him and he landed on his back with his left knee up under him. Since that time he's continued to have back pain and left knee pain. He received an injection in the left knee recently but it has not helped the knee. X-ray did show some degenerative changes and arthritis. The patient was hospitalized for community-acquired pneumonia back in the winter and is recovered well from that and his breathing is stabilized with his current breathing treatments at home. He uses his oxygen only as needed. He denies any chest pain and does have a history of atrial fibrillation and sees a cardiologist, Dr. Pamella Pert and yearly. He is doing well as far as his gastrointestinal tract is concerned and denies problems with swallowing heartburn indigestion nausea vomiting diarrhea or blood in the stool. He is passing his water without problems. He does see the nephrologist on a yearly basis. He has [facility] the [facility]mologist at [facility] He gets his colonoscopies every 5 years by Dr. Arlyce Dice. His blood sugars at home usually run less than 200 between 160 and 175. He sees Dr. Jetty Duhamel his pulmonologist yearly. He sees the oncologist regularly for his lymphoma.     Patient Active  Problem List   Diagnosis Date Noted  . Chronic diastolic heart failure (HCC)   . CAP (community acquired pneumonia)   . Acute-on-chronic respiratory failure (HCC) 06/07/2015  . Sepsis (HCC) 06/07/2015  . Diabetic neuropathy, type II diabetes mellitus (HCC) 09/25/2014  . Chronic respiratory failure with hypoxia (HCC) 05/25/2014  . Obstructive sleep apnea 05/25/2014  . Benign neoplasm of ascending colon 05/05/2014  . Excessive daytime sleepiness 01/24/2014  . Mediastinal adenopathy 06/20/2013  . Severe obesity (BMI >= 40) (HCC) 05/01/2013  . Generalized anxiety disorder 05/01/2013  . Lung nodules 03/11/2013  . Chronic anticoagulation 08/20/2012  . FIBRILLATION, ATRIAL 05/12/2010  . NHL (non-Hodgkin's lymphoma) (HCC) 01/26/2010  . Obesity, morbid (HCC) 04/27/2009  . CARDIOVASCULAR STUDIES, ABNORMAL 04/27/2009  . ABNORMAL STRESS ELECTROCARDIOGRAM 03/25/2009  . PERSONAL HISTORY OF COLONIC POLYPS 03/02/2009  . Diabetes mellitus type 2, insulin dependent (HCC) 04/29/2007  . Hyperlipidemia 04/29/2007  . Hypertension 04/29/2007  . Seasonal and perennial allergic rhinitis 04/29/2007  . COPD exacerbation (HCC) 04/29/2007  . G E R D 04/29/2007  . BENIGN PROSTATIC HYPERTROPHY, HX OF 04/29/2007   Outpatient Encounter Prescriptions as of 09/29/2015  Medication Sig  . acetaminophen (TYLENOL) 500 MG tablet Take 1,000 mg by mouth every 6 (six) hours as needed for headache. Reported on 04/15/2015  . albuterol (PROAIR HFA) 108 (90 BASE) MCG/ACT inhaler Inhale 2 puffs into the lungs every 6 (six) hours as needed for wheezing or shortness of breath.  Marland Kitchen  ALPRAZolam (XANAX) 0.25 MG tablet TAKE ONE TABLET BY MOUTH AT BEDTIME  . Blood Glucose Calibration (TAI DOC CONTROL) NORMAL SOLN Use as directed.  . Blood Glucose Monitoring Suppl (CLEVER CHEK AUTO-CODE VOICE) DEVI USE AS DIRECTED  . budesonide-formoterol (SYMBICORT) 160-4.5 MCG/ACT inhaler Inhale 2 puffs into the lungs 2 (two) times daily.  .  Cholecalciferol (VITAMIN D3) 5000 UNITS TABS Take 1 tablet by mouth every morning.  . enalapril (VASOTEC) 20 MG tablet Take 1 tablet (20 mg total) by mouth daily.  Marland Kitchen ipratropium-albuterol (DUONEB) 0.5-2.5 (3) MG/3ML SOLN PLACE 1 VIAL (3 MLS) IN THE NEBULIZER EVERY 6 HOURS AS NEEDED  . Lancet Devices (LANCING DEVICE) MISC USE AS DIRECTED  . metFORMIN (GLUCOPHAGE) 500 MG tablet TAKE ONE TABLET BY MOUTH IN THE MORNING AND TWO IN THE EVENING  . metoprolol (LOPRESSOR) 100 MG tablet TAKE ONE TABLET BY MOUTH TWICE DAILY  . NOVOLIN R RELION 100 UNIT/ML injection Inject 32 Units into the skin 2 (two) times daily before a meal.  . simvastatin (ZOCOR) 40 MG tablet TAKE ONE TABLET BY MOUTH ONCE DAILY  . warfarin (COUMADIN) 5 MG tablet TAKE ONE & ONE-HALF TABLETS BY MOUTH ONCE DAILY EVERY MONDAY AND EVERY FRIDAY THEN TAKE ONE  TABLET THE REST OF THE WEEK  . [DISCONTINUED] furosemide (LASIX) 20 MG tablet Take 20 mg by mouth daily.  . [DISCONTINUED] methylPREDNISolone acetate (DEPO-MEDROL) injection 40 mg    No facility-administered encounter medications on file as of 09/29/2015.      Review of Systems  Constitutional: Negative.   HENT: Negative.   Eyes: Negative.   Respiratory: Negative.   Cardiovascular: Negative.   Gastrointestinal: Negative.   Endocrine: Negative.   Genitourinary: Negative.   Musculoskeletal: Positive for back pain (low back pain = fall) and arthralgias (left hip and left knee pain = fall).  Skin: Negative.   Allergic/Immunologic: Negative.   Neurological: Negative.   Hematological: Negative.   Psychiatric/Behavioral: Negative.        Objective:   Physical Exam  Constitutional: He is oriented to person, place, and time. He appears well-developed and well-nourished. No distress.  HENT:  Head: Normocephalic and atraumatic.  Right Ear: External ear normal.  Left Ear: External ear normal.  Nose: Nose normal.  Mouth/Throat: Oropharynx is clear and moist. No oropharyngeal  exudate.  Eyes: Conjunctivae and EOM are normal. Pupils are equal, round, and reactive to light. Right eye exhibits no discharge. Left eye exhibits no discharge. No scleral icterus.  The patient is due for an eye exam soon and he will make sure that he gets this done.  Neck: Normal range of motion. Neck supple. No thyromegaly present.  No adenopathy or bruits  Cardiovascular: Normal rate, normal heart sounds and intact distal pulses.   No murmur heard. The heart is irregular irregular at 72/m  Pulmonary/Chest: Effort normal and breath sounds normal. No respiratory distress. He has no wheezes. He has no rales. He exhibits no tenderness.  Breast sounds are distant but there are no wheezes or rhonchi.  Abdominal: Soft. Bowel sounds are normal. He exhibits no mass. There is no tenderness. There is no rebound and no guarding.  The patient has morbid obesity. No organomegaly or masses were palpable and no bruits were present.  Musculoskeletal: Normal range of motion. He exhibits no edema or tenderness.  The patient has pain in the left medial knee joint line and some discomfort in the lower back with laying down and sitting up.  Lymphadenopathy:  He has no cervical adenopathy.  Neurological: He is alert and oriented to person, place, and time. He has normal reflexes. No cranial nerve deficit.  Skin: Skin is warm and dry. No rash noted.  Psychiatric: He has a normal mood and affect. His behavior is normal. Judgment and thought content normal.  Nursing note and vitals reviewed.  BP 138/84 mmHg  Pulse 83  Temp(Src) 97 F (36.1 C) (Oral)  Ht '5\' 9"'$  (1.753 m)  Wt 331 lb (150.141 kg)  BMI 48.86 kg/m2  SpO2 93%  [name] reading (PRIMARY) by  Dr.Judaea Burgoon-LS spine films with results pending                                        Assessment & Plan:  1. Chronic atrial fibrillation (HCC) -Continue with Coumadin and regular follow-ups with cardiology - CoaguChek XS/INR Waived - CBC with  Differential/Platelet  2. Chronic anticoagulation -Continue with current dose of Coumadin and repeat pro time in about 6 weeks - CoaguChek XS/INR Waived - CBC with Differential/Platelet  3. Type 2 diabetes mellitus with diabetic neuropathy, with long-term current use of insulin ([place]) -Continue with current treatment pending results of lab work - Bayer DCA Hb A1c Waived - BMP8+EGFR - CBC with Differential/Platelet  4. Hyperlipidemia -Continue with aggressive therapeutic lifestyle changes and current treatment pending results of lab work - CBC with Differential/Platelet - Hepatic function panel - Lipid panel  5. Vitamin D deficiency -Continue with current treatment pending results of lab work - CBC with Differential/Platelet - VITAMIN D 25 Hydroxy (Vit-D Deficiency, Fractures)  6. Gastroesophageal reflux disease, esophagitis presence not specified -The patient is having no symptoms with this today you'll continue to watch his diet closely and not eat anything late at night. - CBC with Differential/Platelet - Hepatic function panel  7. Morbid obesity due to excess calories ([place]) -Continue to make all efforts with weight loss by drinking more water and getting as much exercise as possible and watching diet closely - CBC with Differential/Platelet  8. Hypertension -The blood pressure is good today and he will continue with current treatment - BMP8+EGFR - CBC with Differential/Platelet - Hepatic function panel  9. CAP (community acquired pneumonia) -He has recovered well from the pneumonia episode and his hospitalization and is back to his baseline.  10. Chronic diastolic heart failure ([place]) -Continue to follow-up with cardiology  11. Chronic respiratory failure with hypoxia (HCC) -He is recovered well from the hospitalization and the community acquired pneumonia and will continue with his current nebulizer treatments and follow-up with Dr. Annamaria Boots as needed  12. Diabetes  mellitus type 2, insulin dependent ([place]) -Continue with aggressive therapeutic lifestyle changes and current treatment pending results of lab work  13. 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 Earlie Server  14. Low back pain, unspecified back pain laterality, with sciatica presence unspecified -Take Tylenol and use warm wet compresses and appointment will be arranged with the orthopedic surgeon that comes to our office - DG Lumbar Spine 2-3 Views; Future - Ambulatory referral to Orthopedic Surgery  15. Left hip pain - DG Lumbar Spine 2-3 Views; Future - Ambulatory referral to Orthopedic Surgery  16. Left knee pain -There were arthritic changes present on the last knee films that were done and because the patient is not better we will have orthopedic surgeon look at him at his  next visit here. - DG Lumbar Spine 2-3 Views; Future - Ambulatory referral to Orthopedic Surgery  Patient Instructions   Anticoagulation Dose Instructions as of 09/29/2015      Dorene Grebe Tue Wed Thu Fri Sat   New Dose 5 mg 7.5 _0  mg    Description        Continue current warfarin dose of 7.41m mondays only and 558mall other days.     INR was 2.5 today                       Medicare Annual Wellness Visit  [place] and the medical providers at [facility] to bring you the best medical care.  In doing so we not only want to address your current medical conditions and concerns but also to detect new conditions early and prevent illness, disease and health-related problems.    Medicare offers a yearly Wellness Visit which allows our clinical staff to assess your need for preventative services including immunizations, lifestyle education, counseling to decrease risk of preventable diseases and screening for fall risk and other medical concerns.    This visit is provided free of charge (no copay) for  all Medicare recipients. The clinical pharmacists at [facility] to conduct these Wellness Visits which will also include a thorough review of all your medications.    As you primary medical provider recommend that you make an appointment for your Annual Wellness Visit if you have not done so already this year.  You may set up this appointment before you leave today or you may call back [phone] schedule an appointment.  Please make sure when you call that you mention that you are scheduling your Annual Wellness Visit with the clinical pharmacist so that the appointment may be made for the proper length of time.     Continue current medications. Continue good therapeutic lifestyle changes which include good diet and exercise. Fall precautions discussed with patient. If an FOBT was given today- please return it to our front desk. If you are over 5054ears old - you may need Prevnar 1348r the adult Pneumonia vaccine.  **Flu shots are available--- please call and schedule a FLU-CLINIC appointment**  After your visit with [facility] you will receive a survey in the mail or online from [employer] your care with [facility] take a moment to fill this out. Your feedback is very important to [facility] you can help [facility] understand your patient needs as well as improve your experience and satisfaction. WE CARE ABOUT YOU!!!   Use warm wet compresses to the back and the knee 20 minutes 3 or 4 times daily Continue to try to stay active with walking Continue to be careful and not put yourself at risk for falling We will arrange for you to see the orthopedic specialist that comes to our office and he can further evaluate your left knee and back pain Continue to work on losing weight by watching what you eat more closely Drink more water and continue to follow-up with Dr. MoEarlie ServerDr. YoAnnamaria Bootsthe cardiologist, and the nephrologist. Please call [facility] and  schedule eye exam and make sure that we get a copy of the report   DoArrie SenateD

## 2015-09-30 LAB — LIPID PANEL
CHOL/HDL RATIO: 2.7 ratio (ref 0.0–5.0)
Cholesterol, Total: 130 mg/dL (ref 100–199)
HDL: 48 mg/dL (ref >39)
LDL CALC: 45 mg/dL (ref 0–99)
TRIGLYCERIDES: 186 mg/dL — AB (ref 0–149)
VLDL Cholesterol Cal: 37 mg/dL (ref 5–40)

## 2015-09-30 LAB — CBC WITH DIFFERENTIAL/PLATELET
BASOS ABS: 0 10*3/uL (ref 0.0–0.2)
BASOS: 0 %
EOS (ABSOLUTE): 0.1 10*3/uL (ref 0.0–0.4)
Eos: 1 %
Hematocrit: 44.8 % (ref 37.5–51.0)
Hemoglobin: 14.3 g/dL (ref 12.6–17.7)
IMMATURE GRANS (ABS): 0 10*3/uL (ref 0.0–0.1)
Immature Granulocytes: 0 %
LYMPHS ABS: 1.6 10*3/uL (ref 0.7–3.1)
LYMPHS: 22 %
MCH: 29.8 pg (ref 26.6–33.0)
MCHC: 31.9 g/dL (ref 31.5–35.7)
MCV: 93 fL (ref 79–97)
Monocytes Absolute: 0.5 10*3/uL (ref 0.1–0.9)
Monocytes: 7 %
NEUTROS ABS: 5.1 10*3/uL (ref 1.4–7.0)
Neutrophils: 70 %
PLATELETS: 161 10*3/uL (ref 150–379)
RBC: 4.8 x10E6/uL (ref 4.14–5.80)
RDW: 13.9 % (ref 12.3–15.4)
WBC: 7.3 10*3/uL (ref 3.4–10.8)

## 2015-09-30 LAB — BMP8+EGFR
BUN / CREAT RATIO: 23 (ref 10–24)
BUN: 18 mg/dL (ref 8–27)
CHLORIDE: 95 mmol/L — AB (ref 96–106)
CO2: 29 mmol/L (ref 18–29)
Calcium: 9.2 mg/dL (ref 8.6–10.2)
Creatinine, Ser: 0.8 mg/dL (ref 0.76–1.27)
GFR, EST AFRICAN AMERICAN: 109 mL/min/{1.73_m2} (ref >59)
GFR, EST NON AFRICAN AMERICAN: 94 mL/min/{1.73_m2} (ref >59)
Glucose: 205 mg/dL — ABNORMAL HIGH (ref 65–99)
POTASSIUM: 4.7 mmol/L (ref 3.5–5.2)
SODIUM: 138 mmol/L (ref 134–144)

## 2015-09-30 LAB — HEPATIC FUNCTION PANEL
ALBUMIN: 3.9 g/dL (ref 3.6–4.8)
ALT: 14 IU/L (ref 0–44)
AST: 16 IU/L (ref 0–40)
Alkaline Phosphatase: 72 IU/L (ref 39–117)
BILIRUBIN TOTAL: 1.2 mg/dL (ref 0.0–1.2)
Bilirubin, Direct: 0.3 mg/dL (ref 0.00–0.40)
TOTAL PROTEIN: 6.3 g/dL (ref 6.0–8.5)

## 2015-09-30 LAB — VITAMIN D 25 HYDROXY (VIT D DEFICIENCY, FRACTURES): VIT D 25 HYDROXY: 40.4 ng/mL (ref 30.0–100.0)

## 2015-09-30 LAB — VITAMIN B12: VITAMIN B 12: 270 pg/mL (ref 211–946)

## 2015-10-07 DIAGNOSIS — M545 Low back pain: Secondary | ICD-10-CM | POA: Diagnosis not present

## 2015-10-07 DIAGNOSIS — J449 Chronic obstructive pulmonary disease, unspecified: Secondary | ICD-10-CM | POA: Diagnosis not present

## 2015-10-07 DIAGNOSIS — J9611 Chronic respiratory failure with hypoxia: Secondary | ICD-10-CM | POA: Diagnosis not present

## 2015-10-07 DIAGNOSIS — M4806 Spinal stenosis, lumbar region: Secondary | ICD-10-CM | POA: Diagnosis not present

## 2015-10-07 DIAGNOSIS — M5136 Other intervertebral disc degeneration, lumbar region: Secondary | ICD-10-CM | POA: Diagnosis not present

## 2015-10-07 DIAGNOSIS — M5442 Lumbago with sciatica, left side: Secondary | ICD-10-CM | POA: Diagnosis not present

## 2015-10-08 ENCOUNTER — Ambulatory Visit (HOSPITAL_BASED_OUTPATIENT_CLINIC_OR_DEPARTMENT_OTHER): Payer: Commercial Managed Care - HMO | Admitting: Internal Medicine

## 2015-10-08 ENCOUNTER — Other Ambulatory Visit: Payer: Self-pay | Admitting: Pharmacist

## 2015-10-08 ENCOUNTER — Other Ambulatory Visit (HOSPITAL_BASED_OUTPATIENT_CLINIC_OR_DEPARTMENT_OTHER): Payer: Commercial Managed Care - HMO

## 2015-10-08 ENCOUNTER — Encounter: Payer: Self-pay | Admitting: Internal Medicine

## 2015-10-08 ENCOUNTER — Telehealth: Payer: Self-pay | Admitting: Internal Medicine

## 2015-10-08 VITALS — BP 138/81 | HR 92 | Temp 98.1°F | Resp 23 | Ht 69.0 in | Wt 328.3 lb

## 2015-10-08 DIAGNOSIS — R599 Enlarged lymph nodes, unspecified: Secondary | ICD-10-CM | POA: Diagnosis not present

## 2015-10-08 DIAGNOSIS — Z8572 Personal history of non-Hodgkin lymphomas: Secondary | ICD-10-CM

## 2015-10-08 DIAGNOSIS — D472 Monoclonal gammopathy: Secondary | ICD-10-CM

## 2015-10-08 DIAGNOSIS — C8228 Follicular lymphoma grade III, unspecified, lymph nodes of multiple sites: Secondary | ICD-10-CM | POA: Diagnosis not present

## 2015-10-08 DIAGNOSIS — R590 Localized enlarged lymph nodes: Secondary | ICD-10-CM

## 2015-10-08 DIAGNOSIS — C8332 Diffuse large B-cell lymphoma, intrathoracic lymph nodes: Secondary | ICD-10-CM | POA: Insufficient documentation

## 2015-10-08 HISTORY — DX: Diffuse large b-cell lymphoma, intrathoracic lymph nodes: C83.32

## 2015-10-08 HISTORY — DX: Monoclonal gammopathy: D47.2

## 2015-10-08 LAB — COMPREHENSIVE METABOLIC PANEL
ALT: 14 U/L (ref 0–55)
AST: 15 U/L (ref 5–34)
Albumin: 3.4 g/dL — ABNORMAL LOW (ref 3.5–5.0)
Alkaline Phosphatase: 64 U/L (ref 40–150)
Anion Gap: 8 mEq/L (ref 3–11)
BUN: 22.9 mg/dL (ref 7.0–26.0)
CALCIUM: 9.7 mg/dL (ref 8.4–10.4)
CHLORIDE: 100 meq/L (ref 98–109)
CO2: 31 meq/L — AB (ref 22–29)
CREATININE: 1 mg/dL (ref 0.7–1.3)
EGFR: 82 mL/min/{1.73_m2} — ABNORMAL LOW (ref >90)
Glucose: 206 mg/dl — ABNORMAL HIGH (ref 70–140)
Potassium: 4.4 mEq/L (ref 3.5–5.1)
Sodium: 139 mEq/L (ref 136–145)
Total Bilirubin: 1.19 mg/dL (ref 0.20–1.20)
Total Protein: 7.2 g/dL (ref 6.4–8.3)

## 2015-10-08 LAB — CBC WITH DIFFERENTIAL/PLATELET
BASO%: 0.9 % (ref 0.0–2.0)
BASOS ABS: 0.1 10*3/uL (ref 0.0–0.1)
EOS ABS: 0.1 10*3/uL (ref 0.0–0.5)
EOS%: 1.1 % (ref 0.0–7.0)
HEMATOCRIT: 46.6 % (ref 38.4–49.9)
HEMOGLOBIN: 15.2 g/dL (ref 13.0–17.1)
LYMPH#: 1.3 10*3/uL (ref 0.9–3.3)
LYMPH%: 19.9 % (ref 14.0–49.0)
MCH: 30 pg (ref 27.2–33.4)
MCHC: 32.6 g/dL (ref 32.0–36.0)
MCV: 92 fL (ref 79.3–98.0)
MONO#: 0.4 10*3/uL (ref 0.1–0.9)
MONO%: 6.2 % (ref 0.0–14.0)
NEUT#: 4.8 10*3/uL (ref 1.5–6.5)
NEUT%: 71.9 % (ref 39.0–75.0)
PLATELETS: 165 10*3/uL (ref 140–400)
RBC: 5.07 10*6/uL (ref 4.20–5.82)
RDW: 13.6 % (ref 11.0–14.6)
WBC: 6.7 10*3/uL (ref 4.0–10.3)

## 2015-10-08 LAB — LACTATE DEHYDROGENASE: LDH: 180 U/L (ref 125–245)

## 2015-10-08 NOTE — Progress Notes (Signed)
[facility] Telephone:([phone]   Fax:([phone]) [place], [place] [address]  DIAGNOSIS:  1) History of high-grade B-cell non-Hodgkin lymphoma diagnosed in September of 2011. 2) hypermetabolic mediastinal lymphadenopathy. 3) monoclonal gammopathy of undetermined significance  PRIOR THERAPY: Status post systemic chemotherapy with 6 cycles of infusional CHOPE with Rituxan.  CURRENT THERAPY: Observation  INTERVAL HISTORY: Justin Ruiz 65 y.o. male returns to the clinic today for followup visit accompanied by his wife. The patient is feeling well today with no specific complaints. He denied having any significant weight loss or night sweats. He has no palpable lymphadenopathy. He has no bleeding issues. He has no chest pain but continues to have shortness breath with exertion with no cough or hemoptysis. He had recently and he twisted his knee. Imaging studies showed no concerning fracture or abnormalities. He continues to have mild low back pain. He had repeat CBC, comprehensive metabolic panel and LDH as well as myeloma panel performed earlier today and he is here for evaluation and discussion of his lab results.  MEDICAL HISTORY: Past Medical History  Diagnosis Date  . Diabetes mellitus   . Asthma   . Hyperlipidemia   . Obesity     exogenous  . BPH (benign prostatic hypertrophy)   . Colon polyps   . Shingles   . GERD (gastroesophageal reflux disease)   . Arthritis   . Asthma   . HTN (hypertension)     x 3 years  . Lung nodules 03/11/2013  . Mediastinal adenopathy 06/20/2013    CT  & PET 2/15   . Cancer ([place])   . NHL (non-Hodgkin's lymphoma) ([place])     nhl dx 9/11  . History of oxygen administration     occ. oxygen use at 2 l/m as needed- not using at this time  . Sleep apnea     recent study 04-07-14 awaiting results, no cpap yet.-Dr. Glynn Octave  . COPD (chronic obstructive pulmonary disease)  (HCC)     ALLERGIES:  is allergic to avapro and lipitor.  MEDICATIONS:  Current Outpatient Prescriptions  Medication Sig Dispense Refill  . acetaminophen (TYLENOL) 500 MG tablet Take 1,000 mg by mouth every 6 (six) hours as needed for headache. Reported on 04/15/2015    . albuterol (PROAIR HFA) 108 (90 BASE) MCG/ACT inhaler Inhale 2 puffs into the lungs every 6 (six) hours as needed for wheezing or shortness of breath. 8.5 g 5  . ALPRAZolam (XANAX) 0.25 MG tablet TAKE ONE TABLET BY MOUTH AT BEDTIME 30 tablet 1  . Blood Glucose Calibration (TAI DOC CONTROL) NORMAL SOLN Use as directed.    . Blood Glucose Monitoring Suppl (CLEVER CHEK AUTO-CODE VOICE) DEVI USE AS DIRECTED 1 each 2  . budesonide-formoterol (SYMBICORT) 160-4.5 MCG/ACT inhaler Inhale 2 puffs into the lungs 2 (two) times daily. 1 Inhaler 3  . Cholecalciferol (VITAMIN D3) 5000 UNITS TABS Take 1 tablet by mouth every morning.    . enalapril (VASOTEC) 20 MG tablet Take 1 tablet (20 mg total) by mouth daily. 90 tablet 0  . ipratropium-albuterol (DUONEB) 0.5-2.5 (3) MG/3ML SOLN PLACE 1 VIAL (3 MLS) IN THE NEBULIZER EVERY 6 HOURS AS NEEDED 1080 mL 0  . Lancet Devices (LANCING DEVICE) MISC USE AS DIRECTED 1 each 2  . metFORMIN (GLUCOPHAGE) 500 MG tablet TAKE ONE TABLET BY MOUTH IN THE MORNING AND TWO IN THE EVENING 270 tablet 0  .  metoprolol (LOPRESSOR) 100 MG tablet TAKE ONE TABLET BY MOUTH TWICE DAILY 180 tablet 0  . NOVOLIN R RELION 100 UNIT/ML injection Inject 32 Units into the skin 2 (two) times daily before a meal.    . simvastatin (ZOCOR) 40 MG tablet TAKE ONE TABLET BY MOUTH ONCE DAILY 90 tablet 0  . warfarin (COUMADIN) 5 MG tablet TAKE ONE & ONE-HALF TABLETS BY MOUTH ONCE DAILY EVERY MONDAY AND EVERY FRIDAY THEN TAKE ONE  TABLET THE REST OF THE WEEK 140 tablet 2  . [DISCONTINUED] digoxin (LANOXIN) 0.25 MG tablet Take 250 mcg by mouth daily.      No current facility-administered medications for this visit.    SURGICAL HISTORY:   Past Surgical History  Procedure Laterality Date  . Appendectomy    . Colonoscopy with propofol N/A 05/05/2014    Procedure: COLONOSCOPY WITH PROPOFOL;  Surgeon: Inda Castle, MD;  Location: [facility];  Service: Endoscopy;  Laterality: N/A;    REVIEW OF SYSTEMS:  A comprehensive review of systems was negative except for: Constitutional: positive for fatigue Respiratory: positive for dyspnea on exertion Musculoskeletal: positive for back pain   PHYSICAL EXAMINATION: General appearance: alert, cooperative and no distress Head: Normocephalic, without obvious abnormality, atraumatic Neck: no adenopathy, no JVD, supple, symmetrical, trachea midline and thyroid not enlarged, symmetric, no tenderness/mass/nodules Lymph nodes: Cervical, supraclavicular, and axillary nodes normal. Resp: clear to auscultation bilaterally Back: symmetric, no curvature. ROM normal. No CVA tenderness. Cardio: regular rate and rhythm, S1, S2 normal, no murmur, click, rub or gallop GI: soft, non-tender; bowel sounds normal; no masses,  no organomegaly Extremities: extremities normal, atraumatic, no cyanosis or edema Neurologic: Alert and oriented X 3, normal strength and tone. Normal symmetric reflexes. Normal coordination and gait  ECOG PERFORMANCE STATUS: 1 - Symptomatic but completely ambulatory  Blood pressure 138/81, pulse 92, temperature 98.1 F (36.7 C), temperature source Oral, resp. rate 23, height '5\' 9"'  (1.753 m), weight 328 lb 4.8 oz (148.916 kg), SpO2 90 %.  LABORATORY DATA: Lab Results  Component Value Date   WBC 6.7 10/08/2015   HGB 15.2 10/08/2015   HCT 46.6 10/08/2015   MCV 92.0 10/08/2015   PLT 165 10/08/2015      Chemistry      Component Value Date/Time   NA 138 09/29/2015 0849   NA 139 06/09/2015 0543   NA 140 04/09/2015 0908   NA 136 11/01/2011 0753   K 4.7 09/29/2015 0849   K 4.4 04/09/2015 0908   K 4.8* 11/01/2011 0753   CL 95* 09/29/2015 0849   CL 100 09/03/2012 0751    CL 91* 11/01/2011 0753   CO2 29 09/29/2015 0849   CO2 29 04/09/2015 0908   CO2 34* 11/01/2011 0753   BUN 18 09/29/2015 0849   BUN 34* 06/09/2015 0543   BUN 24.9 04/09/2015 0908   BUN 18 11/01/2011 0753   CREATININE 0.80 09/29/2015 0849   CREATININE 1.0 04/09/2015 0908   CREATININE 0.82 11/14/2012 0918   CREATININE 0.8 03/29/2012   GLU 185 03/29/2012      Component Value Date/Time   CALCIUM 9.2 09/29/2015 0849   CALCIUM 9.6 04/09/2015 0908   CALCIUM 8.8 11/01/2011 0753   ALKPHOS 72 09/29/2015 0849   ALKPHOS 59 04/09/2015 0908   ALKPHOS 57 11/01/2011 0753   AST 16 09/29/2015 0849   AST 17 04/09/2015 0908   AST 14 11/01/2011 0753   ALT 14 09/29/2015 0849   ALT 15 04/09/2015 0908   ALT 17 11/01/2011  0753   BILITOT 1.2 09/29/2015 0849   BILITOT 1.0 06/07/2015 0706   BILITOT 1.38* 04/09/2015 0908   BILITOT 1.50 11/01/2011 0753       RADIOGRAPHIC STUDIES: Dg Lumbar Spine 2-3 Views  09/29/2015  CLINICAL DATA:  Recent fall with low back pain, also history of non-Hodgkin's lymphoma diagnosed in 2011 EXAM: LUMBAR SPINE - 2-3 VIEW COMPARISON:  CT abdomen pelvis of 04/09/2015 FINDINGS: The lumbar vertebrae are in normal alignment. No compression deformity is seen. Mild degenerative change is present at L3-4 and L4-5 levels with some loss of disc space and anterior osteophyte formation. The bowel gas pattern is nonspecific. IMPRESSION: Normal alignment with mild degenerative change at L3-4 and L4-5. No compression deformity. Electronically Signed   By: Ivar Drape M.D.   On: 09/29/2015 10:21   Dg Knee 1-2 Views Left  09/17/2015  CLINICAL DATA:  [place] last night with left knee pain EXAM: LEFT KNEE - 1-2 VIEW COMPARISON:  None. FINDINGS: Standing views of the knees show considerable loss of lateral compartment joint space on the left with sclerosis and spurring present. On the frontal view joint spaces on the right appear well preserved. On the lateral view of the left knee there is also  degenerative change involving the patellofemoral articulation with loss of joint space and spurring. No fracture is seen. No joint effusion is noted. 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 No fracture or effusion. Electronically Signed   By: Ivar Drape M.D.   On: 09/17/2015 11:33    ASSESSMENT AND PLAN: This is a very pleasant 65 years old white male with history of high-grade be set non-Hodgkin lymphoma suspicious for Burkitt's lymphoma but with negative cytogenetics for 8;14 abnormality status post 6 cycles of systemic chemotherapy with Rituxan and infusional CHOPE completed in early 2012.  His CBC today is unremarkable but the myeloma panel is still pending. I recommended for him to continue on observation with repeat CBC, comprehensive metabolic panel, LDH and myeloma panel in 6 months. I will see him sooner if the pending lab work showed take any concerning abnormalities. He was advised to call immediately if he has any concerning symptoms in the interval. The patient voices understanding of current disease status and treatment options and is in agreement with the current care plan.  All questions were answered. The patient knows to call the clinic with any problems, questions or concerns. We can certainly see the patient much sooner if necessary.  Disclaimer: This note was dictated with voice recognition software. Similar sounding words can inadvertently be transcribed and may not be corrected upon review.

## 2015-10-08 NOTE — Telephone Encounter (Signed)
per pof to sch pt appt-gave tp copy of avs °

## 2015-10-09 LAB — KAPPA/LAMBDA LIGHT CHAINS
IG KAPPA FREE LIGHT CHAIN: 31.8 mg/L — AB (ref 3.3–19.4)
Ig Lambda Free Light Chain: 22.1 mg/L (ref 5.7–26.3)
Kappa/Lambda FluidC Ratio: 1.44 (ref 0.26–1.65)

## 2015-10-09 LAB — BETA 2 MICROGLOBULIN, SERUM: BETA 2: 2.3 mg/L (ref 0.6–2.4)

## 2015-10-10 DIAGNOSIS — J439 Emphysema, unspecified: Secondary | ICD-10-CM | POA: Diagnosis not present

## 2015-10-10 DIAGNOSIS — J449 Chronic obstructive pulmonary disease, unspecified: Secondary | ICD-10-CM | POA: Diagnosis not present

## 2015-10-12 LAB — MULTIPLE MYELOMA PANEL, SERUM
ALBUMIN/GLOB SERPL: 1.2 (ref 0.7–1.7)
Albumin SerPl Elph-Mcnc: 3.5 g/dL (ref 2.9–4.4)
Alpha 1: 0.2 g/dL (ref 0.0–0.4)
Alpha2 Glob SerPl Elph-Mcnc: 0.8 g/dL (ref 0.4–1.0)
B-Globulin SerPl Elph-Mcnc: 1.1 g/dL (ref 0.7–1.3)
Gamma Glob SerPl Elph-Mcnc: 0.8 g/dL (ref 0.4–1.8)
Globulin, Total: 3.1 g/dL (ref 2.2–3.9)
IGM (IMMUNOGLOBIN M), SRM: 138 mg/dL (ref 20–172)
IgA, Qn, Serum: 173 mg/dL (ref 61–437)
IgG, Qn, Serum: 877 mg/dL (ref 700–1600)
Total Protein: 6.6 g/dL (ref 6.0–8.5)

## 2015-10-16 DIAGNOSIS — M1712 Unilateral primary osteoarthritis, left knee: Secondary | ICD-10-CM | POA: Diagnosis not present

## 2015-10-19 ENCOUNTER — Telehealth: Payer: Self-pay | Admitting: Family Medicine

## 2015-10-19 NOTE — Telephone Encounter (Signed)
In general warfarin interruption is not necessary prior to intraarticular injections.  Patient can hold the night before procedure and then restart the night following procedure.  Patient notified.

## 2015-10-26 ENCOUNTER — Other Ambulatory Visit: Payer: Self-pay | Admitting: Family Medicine

## 2015-10-28 DIAGNOSIS — M1712 Unilateral primary osteoarthritis, left knee: Secondary | ICD-10-CM | POA: Diagnosis not present

## 2015-10-28 DIAGNOSIS — M5136 Other intervertebral disc degeneration, lumbar region: Secondary | ICD-10-CM | POA: Diagnosis not present

## 2015-11-01 ENCOUNTER — Other Ambulatory Visit: Payer: Self-pay | Admitting: Family Medicine

## 2015-11-06 ENCOUNTER — Other Ambulatory Visit: Payer: Self-pay | Admitting: Family Medicine

## 2015-11-06 DIAGNOSIS — J9611 Chronic respiratory failure with hypoxia: Secondary | ICD-10-CM | POA: Diagnosis not present

## 2015-11-06 DIAGNOSIS — J449 Chronic obstructive pulmonary disease, unspecified: Secondary | ICD-10-CM | POA: Diagnosis not present

## 2015-11-09 ENCOUNTER — Encounter: Payer: Self-pay | Admitting: Pharmacist

## 2015-11-09 ENCOUNTER — Ambulatory Visit (INDEPENDENT_AMBULATORY_CARE_PROVIDER_SITE_OTHER): Payer: Commercial Managed Care - HMO | Admitting: Pharmacist

## 2015-11-09 VITALS — BP 136/72 | HR 76 | Ht 69.0 in | Wt 317.0 lb

## 2015-11-09 DIAGNOSIS — Z0000 Encounter for general adult medical examination without abnormal findings: Secondary | ICD-10-CM | POA: Diagnosis not present

## 2015-11-09 DIAGNOSIS — I482 Chronic atrial fibrillation, unspecified: Secondary | ICD-10-CM

## 2015-11-09 DIAGNOSIS — Z7901 Long term (current) use of anticoagulants: Secondary | ICD-10-CM

## 2015-11-09 DIAGNOSIS — J449 Chronic obstructive pulmonary disease, unspecified: Secondary | ICD-10-CM | POA: Diagnosis not present

## 2015-11-09 DIAGNOSIS — I5032 Chronic diastolic (congestive) heart failure: Secondary | ICD-10-CM

## 2015-11-09 DIAGNOSIS — J439 Emphysema, unspecified: Secondary | ICD-10-CM | POA: Diagnosis not present

## 2015-11-09 LAB — COAGUCHEK XS/INR WAIVED
INR: 1.9 — AB (ref 0.9–1.1)
Prothrombin Time: 23 s

## 2015-11-09 NOTE — Progress Notes (Signed)
Patient ID: Justin Ruiz, male   DOB: 15-Dec-1950, 65 y.o.   MRN: [id number]    Subjective:   Justin Ruiz is a 65 y.o. male who presents for a subsequent Medicare Annual Wellness Visit and recheck INR.   Review of Systems  Constitutional: Positive for weight loss (patient has lost 21# in last year by being more active and changing diet).  HENT: Negative.   Eyes: Negative.   Respiratory: Negative.   Cardiovascular: Negative.   Gastrointestinal: Negative.   Genitourinary: Negative.   Musculoskeletal: Positive for joint pain (knee pain).  Skin: Negative.   Neurological: Negative.   Endo/Heme/Allergies: Bruises/bleeds easily (sometiimes - related to warfain ).  Psychiatric/Behavioral: Negative.       Current Medications (verified) Outpatient Encounter Prescriptions as of 11/09/2015  Medication Sig  . acetaminophen (TYLENOL) 500 MG tablet Take 1,000 mg by mouth every 6 (six) hours as needed for headache. Reported on 10/08/2015  . albuterol (PROAIR HFA) 108 (90 BASE) MCG/ACT inhaler Inhale 2 puffs into the lungs every 6 (six) hours as needed for wheezing or shortness of breath.  . ALPRAZolam (XANAX) 0.25 MG tablet TAKE ONE TABLET BY MOUTH AT BEDTIME  . Blood Glucose Calibration (TAI DOC CONTROL) NORMAL SOLN Use as directed.  . Blood Glucose Monitoring Suppl (CLEVER CHEK AUTO-CODE VOICE) DEVI USE AS DIRECTED  . budesonide-formoterol (SYMBICORT) 160-4.5 MCG/ACT inhaler Inhale 2 puffs into the lungs 2 (two) times daily.  . Cholecalciferol (VITAMIN D3) 5000 UNITS TABS Take 1 tablet by mouth every morning.  . enalapril (VASOTEC) 20 MG tablet Take 1 tablet (20 mg total) by mouth daily.  [employer] (LANCING DEVICE) MISC USE AS DIRECTED  . metFORMIN (GLUCOPHAGE) 500 MG tablet TAKE ONE TABLET BY MOUTH IN THE MORNING AND TAKE TWO TABLETS IN THE EVENING  . metoprolol (LOPRESSOR) 100 MG tablet TAKE ONE TABLET BY MOUTH TWICE DAILY  . NOVOLIN N RELION 100 UNIT/ML injection INJECT 35 UNITS  INTO THE SKIN 2 TIMES DAILY BEFORE A MEAL (Patient taking differently: INJECT 32 UNITS INTO THE SKIN 2 TIMES DAILY BEFORE A MEAL)  . NOVOLIN R RELION 100 UNIT/ML injection Inject 32 Units into the skin 2 (two) times daily before a meal.  . simvastatin (ZOCOR) 40 MG tablet TAKE ONE TABLET BY MOUTH ONCE DAILY  . warfarin (COUMADIN) 5 MG tablet TAKE ONE & ONE-HALF TABLETS BY MOUTH ONCE DAILY EVERY MONDAY AND EVERY FRIDAY THEN TAKE ONE  TABLET THE REST OF THE WEEK  . ipratropium-albuterol (DUONEB) 0.5-2.5 (3) MG/3ML SOLN PLACE 1 VIAL (3 MLS) IN THE NEBULIZER EVERY 6 HOURS AS NEEDED (Patient not taking: Reported on 10/08/2015)   No facility-administered encounter medications on file as of 11/09/2015.    Allergies (verified) Avapro and Lipitor   History: Past Medical History  Diagnosis Date  . Diabetes mellitus   . Asthma   . Hyperlipidemia   . Obesity     exogenous  . BPH (benign prostatic hypertrophy)   . Colon polyps   . Shingles   . GERD (gastroesophageal reflux disease)   . Arthritis   . Asthma   . HTN (hypertension)     x 3 years  . Lung nodules 03/11/2013  . Mediastinal adenopathy 06/20/2013    CT  & PET 2/15   . Cancer (HCC)   . NHL (non-Hodgkin's lymphoma) (HCC)     nhl dx 9/11  . History of oxygen administration     occ. oxygen use at 2 l/m as needed- not  using at this time  . Sleep apnea     recent study 04-07-14 awaiting results, no cpap yet.-Dr. Glynn Octave  . COPD (chronic obstructive pulmonary disease) ([place])   . 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 Oxygen deficiency    Past Surgical History  Procedure Laterality Date  . Appendectomy    . Colonoscopy with propofol N/A 05/05/2014    Procedure: COLONOSCOPY WITH PROPOFOL;  Surgeon: Inda Castle, MD;  Location: [facility];  Service: Endoscopy;  Laterality: N/A;   Family History  Problem Relation Age of Onset  . Liver cancer Mother   . Diabetes Mother   . Heart disease Mother   .  Colon cancer Father   . Prostate cancer Father   . Colon polyps Father   . Pulmonary embolism Sister   . Diabetes Sister   . Heart attack Sister   . Aortic aneurysm Sister   . COPD Sister   . Heart disease Sister   . COPD Sister   . Heart disease Sister   . Diabetes Maternal Grandmother    Social History   Occupational History  . Not on file.   Social History Main Topics  . Smoking status: Former Smoker -- 2.00 packs/day for 40 years    Start date: 12/17/1968    Quit date: 11/14/2004  . Smokeless tobacco: Never Used     Comment: 2 ppd   . Alcohol Use: No     Comment: previous  . Drug Use: No  . Sexual Activity: Yes    Do you feel safe at home?  Yes  Dietary issues and exercise activities: Current Exercise Habits: Home exercise routine, Type of exercise: walking, Time (Minutes): 10, Frequency (Times/Week): 3, Weekly Exercise (Minutes/Week): 30, Intensity: Mild  Current Dietary habits:  Has decreased potion sizes and is limiting sugar intake. Have seen improvements in weight and BG over the last year!   Objective:    Today's Vitals   11/09/15 0828  BP: 136/72  Pulse: 76  Height: _0  (1.753 m)  Weight: 317 lb (143.79 kg)  PainSc: 0-No pain   Body mass index is 46.79 kg/(m^2).   INR was 1.9 today Last A1c = 7.3% (09/29/2015)  Activities of Daily Living In your present state of health, do you have any difficulty performing the following activities: 11/09/2015 06/07/2015  Hearing? N N  Vision? N N  Difficulty concentrating or making decisions? N N  Walking or climbing stairs? Y Y  Dressing or bathing? N N  Doing errands, shopping? N N  Preparing Food and eating ? N -  Using the Toilet? N -  In the past six months, have you accidently leaked urine? N -  Do you have problems with loss of bowel control? N -  Managing your Medications? N -  Managing your Finances? N -  Housekeeping or managing your Housekeeping? N -    Are there smokers in your home (other  than you)? No   Cardiac Risk Factors include: advanced age (>29mn, >>64women);diabetes mellitus;dyslipidemia;family history of premature cardiovascular disease;male gender;obesity (BMI >30kg/m2)  Depression Screen PHQ 2/9 Scores 11/09/2015 09/29/2015 09/17/2015 06/23/2015  PHQ - 2 Score 0 0 0 0  PHQ- 9 Score - - - -    Fall Risk Fall Risk  11/09/2015 09/29/2015 06/23/2015 05/25/2015 09/25/2014  Falls in the past year? Yes Yes No No No  Number falls in past yr: 2 or more 1 - - -  Injury with Fall? No  No - - -  Risk Factor Category  High Fall Risk - - - -  Risk for fall due to : Impaired balance/gait - - - -  Risk for fall due to (comments): seeing orthopedist for knee pain and to help with falls - - - -  Follow up Falls prevention discussed - - - -    Cognitive Function: MMSE - Mini Mental State Exam 11/09/2015 09/25/2014  Orientation to time 5 5  Orientation to Place 5 5  Registration 3 3  Attention/ Calculation 5 5  Recall 3 3  Language- name 2 objects 2 2  Language- repeat 1 1  Language- follow 3 step command 3 3  Language- read & follow direction 0 1  Write a sentence 1 1  Copy design 1 1  Total score 29 30    Immunizations and Health Maintenance Immunization History  Administered Date(s) Administered  . Influenza Split 01/09/2012  . Influenza Whole 01/23/2009, 01/24/2011  . Influenza,inj,Quad PF,36+ Mos 01/30/2013, 01/24/2014, 01/21/2015  . Pneumococcal Conjugate-13 02/20/2014  . Pneumococcal Polysaccharide-23 02/24/2011  . Td 04/25/2004  . Tdap 09/24/2010   There are no preventive care reminders to display for this patient.  Patient Care Team: Chipper Herb, MD as PCP - General (Family Medicine) Curt Bears, MD as Consulting Physician (Oncology) Minus Breeding, MD as Consulting Physician (Cardiology) Deneise Lever, MD as Consulting Physician (Pulmonary Disease) Truc Manus Gunning, OD as Consulting Physician (Optometry) Sandford Craze, MD as Referring Physician  (Unknown Physician Specialty) Inda Castle, MD as Consulting Physician (Gastroenterology) [place] as Consulting Physician (Specialist)  Indicate any recent Medical Services you may have received from other than [facility] providers in the past year (date may be approximate).    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 ZOSTAVAX  11/22/2015 (Originally 12/18/2010)  . OPHTHALMOLOGY EXAM  11/29/2015 (Originally 07/31/2015)  . INFLUENZA VACCINE  11/24/2015  . HEMOGLOBIN A1C  03/30/2016  . COLON CANCER SCREENING ANNUAL FOBT  05/31/2016  . FOOT EXAM  09/28/2016  . TETANUS/TDAP  09/23/2020  . COLONOSCOPY  05/05/2024  . Hepatitis C Screening  Completed  . HIV Screening  Completed       Plan:   During the course of the visit Bertram was educated and counseled about the following appropriate screening and preventive services:   Vaccines to include Pneumoccal, Influenza, Hepatitis B, Td, Zostavax - UTD except Zostavax not required because patient has had shingles.  Colorectal cancer screening - colonoscopy and FOBT UTD  Cardiovascular disease screening - UTD;  LDL at goal;  Tg elevated but improved over the last year - continue with diet changes that is helping with both triglycerides and BG.    Past due follow up with cardiologist - referral sent.   Diabetes - A1c si at goal - no med changes recommended  Urine microalbumin - recheck at next visit (patient unable to void today)  Glaucoma screening / Diabetic Eye Exam - appointment made today for follow up with Dr Rona Ravens  Obesity / Nutrition counseling - Continue to limit sugar intake and serving sizes.  Good job with weight loss over last 12 months.   Advanced Directives - information discussed, patient already has information  packet   Anticoagulation Dose Instructions as of 11/09/2015  Nancy Fetter RZN Tue Wed Thu Fri Sat   New Dose 5 mg 7.5 _0  mg    Description        Continue current warfarin dose of 7.39m mondays only and 570mall other days.      No orders of the defined types were placed in this encounter.      Patient Instructions (the written plan) were given to the patient.   EcCherre RobinsPHBoulder Community Hospital 11/09/2015

## 2015-11-09 NOTE — Patient Instructions (Addendum)
Anticoagulation Dose Instructions as of 11/09/2015      Justin Ruiz Tue Wed Thu Fri Sat   New Dose 5 mg 7.5 mg 5 mg 5 mg 5 mg 5 mg 5 mg    Description        Continue current warfarin dose of 7.5mg  mondays only and 5mg  all other days.     INR was 1.9 today  Justin Ruiz , Thank you for taking time to come for your Medicare Wellness Visit. I appreciate your ongoing commitment to your health goals. Please review the following plan we discussed and let me know if I can assist you in the future.   These are the goals we discussed:  Eye exam scheduled with Dr Rona Ravens for Tuesday, July 25th at 10am  Will send request for follow up appt for Dr Jenkins Rouge  Continue to stay as active as possible - walking or other exercise as able.  Try to increase to 90 minutes per week and work up to 150 minutes.   Continue with weight loss - you have lost 21 lbs since last years - great job!!  Increase non-starchy vegetables - carrots, green bean, squash, zucchini, tomatoes, onions, peppers, spinach and other green leafy vegetables, cabbage, lettuce, cucumbers, asparagus, okra (not fried), eggplant Limit sugar and processed foods (cakes, cookies, ice cream, crackers and chips) Increase fresh fruit but limit serving sizes 1/2 cup or about the size of tennis or baseball Limit red meat to no more than 1-2 times per week (serving size about the size of your palm) Choose whole grains / lean proteins - whole wheat bread, quinoa, whole grain rice (1/2 cup), fish, chicken, [place] Avoid sugar and calorie containing beverages - soda, sweet tea and juice.  Choose water or unsweetened tea instead.    This is a list of the screening recommended for you and due dates:  Health Maintenance  Topic Date Due  . Shingles Vaccine  11/22/2015 - postponed - cost $47  . Eye exam for diabetics  Due now  . Flu Shot  11/24/2015  . Hemoglobin A1C  03/30/2016  . Stool Blood Test  05/31/2016  . Complete foot exam   09/28/2016  . Tetanus  Vaccine  09/23/2020  . Colon Cancer Screening  05/05/2024  .  Hepatitis C: One time screening is recommended by Center for Disease Control  (CDC) for  adults born from [birth year] through 1965.   Completed  . HIV Screening  Completed  *Topic was postponed. The date shown is not the original due date.    Health Maintenance, Male A healthy lifestyle and preventative care can promote health and wellness.  Maintain regular health, dental, and eye exams.  Eat a healthy diet. Foods like vegetables, fruits, whole grains, low-fat dairy products, and lean protein foods contain the nutrients you need and are low in calories. Decrease your intake of foods high in solid fats, added sugars, and salt. Get information about a proper diet from your health care provider, if necessary.  Regular physical exercise is one of the most important things you can do for your health. Most adults should get at least 150 minutes of moderate-intensity exercise (any activity that increases your heart rate and causes you to sweat) each week. In addition, most adults need muscle-strengthening exercises on 2 or more days a week.   Maintain a healthy weight. The body mass index (BMI) is a screening tool to identify possible weight problems. It provides an estimate of body fat based on  height and weight. Your health care provider can find your BMI and can help you achieve or maintain a healthy weight. For males 20 years and older:  A BMI below 18.5 is considered underweight.  A BMI of 18.5 to 24.9 is normal.  A BMI of 25 to 29.9 is considered overweight.  A BMI of 30 and above is considered obese.  Maintain normal blood lipids and cholesterol by exercising and minimizing your intake of saturated fat. Eat a balanced diet with plenty of fruits and vegetables. Blood tests for lipids and cholesterol should begin at age 73 and be repeated every 5 years. If your lipid or cholesterol levels are high, you are over age 37, or you are at  high risk for heart disease, you may need your cholesterol levels checked more frequently.Ongoing high lipid and cholesterol levels should be treated with medicines if diet and exercise are not working.  If you smoke, find out from your health care provider how to quit. If you do not use tobacco, do not start.  Lung cancer screening is recommended for adults aged 49-80 years who are at high risk for developing lung cancer because of a history of smoking. A yearly low-dose CT scan of the lungs is recommended for people who have at least a 30-pack-year history of smoking and are current smokers or have quit within the past 15 years. A pack year of smoking is smoking an average of 1 pack of cigarettes a day for 1 year (for example, a 30-pack-year history of smoking could mean smoking 1 pack a day for 30 years or 2 packs a day for 15 years). Yearly screening should continue until the smoker has stopped smoking for at least 15 years. Yearly screening should be stopped for people who develop a health problem that would prevent them from having lung cancer treatment.  If you choose to drink alcohol, do not have more than 2 drinks per day. One drink is considered to be 12 oz (360 mL) of beer, 5 oz (150 mL) of wine, or 1.5 oz (45 mL) of liquor.  Avoid the use of street drugs. Do not share needles with anyone. Ask for help if you need support or instructions about stopping the use of drugs.  High blood pressure causes heart disease and increases the risk of stroke. High blood pressure is more likely to develop in:  People who have blood pressure in the end of the normal range (100-139/85-89 mm Hg).  People who are overweight or obese.  People who are African American.  If you are 60-51 years of age, have your blood pressure checked every 3-5 years. If you are 43 years of age or older, have your blood pressure checked every year. You should have your blood pressure measured twice--once when you are at a  hospital or clinic, and once when you are not at a hospital or clinic. Record the average of the two measurements. To check your blood pressure when you are not at a hospital or clinic, you can use:  An automated blood pressure machine at a pharmacy.  A home blood pressure monitor.  If you are 22-22 years old, ask your health care provider if you should take aspirin to prevent heart disease.  Diabetes screening involves taking a blood sample to check your fasting blood sugar level. This should be done once every 3 years after age 63 if you are at a normal weight and without risk factors for diabetes. Testing should be  considered at a younger age or be carried out more frequently if you are overweight and have at least 1 risk factor for diabetes.  Colorectal cancer can be detected and often prevented. Most routine colorectal cancer screening begins at the age of 25 and continues through age 32. However, your health care provider may recommend screening at an earlier age if you have risk factors for colon cancer. On a yearly basis, your health care provider may provide home test kits to check for hidden blood in the stool. A small camera at the end of a tube may be used to directly examine the colon (sigmoidoscopy or colonoscopy) to detect the earliest forms of colorectal cancer. Talk to your health care provider about this at age 79 when routine screening begins. A direct exam of the colon should be repeated every 5-10 years through age 87, unless early forms of precancerous polyps or small growths are found.  People who are at an increased risk for hepatitis B should be screened for this virus. You are considered at high risk for hepatitis B if:  You were born in a country where hepatitis B occurs often. Talk with your health care provider about which countries are considered high risk.  Your parents were born in a high-risk country and you have not received a shot to protect against hepatitis B  (hepatitis B vaccine).  You have HIV or AIDS.  You use needles to inject street drugs.  You live with, or have sex with, someone who has hepatitis B.  You are a man who has sex with other men (MSM).  You get hemodialysis treatment.  You take certain medicines for conditions like cancer, organ transplantation, and autoimmune conditions.  Hepatitis C blood testing is recommended for all people born from [birth year] through 1965 and any individual with known risk factors for hepatitis C.  Healthy men should no longer receive prostate-specific antigen (PSA) blood tests as part of routine cancer screening. Talk to your health care provider about prostate cancer screening.  Testicular cancer screening is not recommended for adolescents or adult males who have no symptoms. Screening includes self-exam, a health care provider exam, and other screening tests. Consult with your health care provider about any symptoms you have or any concerns you have about testicular cancer.  Practice safe sex. Use condoms and avoid high-risk sexual practices to reduce the spread of sexually transmitted infections (STIs).  You should be screened for STIs, including gonorrhea and chlamydia if:  You are sexually active and are younger than 24 years.  You are older than 24 years, and your health care provider tells you that you are at risk for this type of infection.  Your sexual activity has changed since you were last screened, and you are at an increased risk for chlamydia or gonorrhea. Ask your health care provider if you are at risk.  If you are at risk of being infected with HIV, it is recommended that you take a prescription medicine daily to prevent HIV infection. This is called pre-exposure prophylaxis (PrEP). You are considered at risk if:  You are a man who has sex with other men (MSM).  You are a heterosexual man who is sexually active with multiple partners.  You take drugs by injection.  You are  sexually active with a partner who has HIV.  Talk with your health care provider about whether you are at high risk of being infected with HIV. If you choose to begin PrEP, you should  first be tested for HIV. You should then be tested every 3 months for as long as you are taking PrEP.  Use sunscreen. Apply sunscreen liberally and repeatedly throughout the day. You should seek shade when your shadow is shorter than you. Protect yourself by wearing long sleeves, pants, a wide-brimmed hat, and sunglasses year round whenever you are outdoors.  Tell your health care provider of new moles or changes in moles, especially if there is a change in shape or color. Also, tell your health care provider if a mole is larger than the size of a pencil eraser.  A one-time screening for abdominal aortic aneurysm (AAA) and surgical repair of large AAAs by ultrasound is recommended for men aged 28-75 years who are current or former smokers.  Stay current with your vaccines (immunizations).   This information is not intended to replace advice given to you by your health care provider. Make sure you discuss any questions you have with your health care provider.   Document Released: 10/08/2007 Document Revised: 05/02/2014 Document Reviewed: 09/06/2010 Elsevier Interactive Patient Education [employer].

## 2015-11-10 NOTE — Telephone Encounter (Signed)
Last seen 09/29/15 [facility]  If approved route to nurse to call into [facility]

## 2015-11-30 ENCOUNTER — Other Ambulatory Visit: Payer: Self-pay | Admitting: Family Medicine

## 2015-12-01 DIAGNOSIS — H524 Presbyopia: Secondary | ICD-10-CM | POA: Diagnosis not present

## 2015-12-01 DIAGNOSIS — H5210 Myopia, unspecified eye: Secondary | ICD-10-CM | POA: Diagnosis not present

## 2015-12-01 LAB — HM DIABETES EYE EXAM

## 2015-12-02 ENCOUNTER — Encounter: Payer: Self-pay | Admitting: Cardiology

## 2015-12-02 NOTE — Progress Notes (Signed)
HPI Patient presents for routine followup atrial fib. Since I last saw him he has done well from a cardiovascular standpoint.  He did fall and hurt his knee.  He has been slightly limited by this.  The patient denies any new symptoms such as chest discomfort, neck or arm discomfort. There has been no new shortness of breath, PND or orthopnea. There have been no reported palpitations, presyncope or syncope.  He does not notice his atrial fib and has no problems with his blood thinner.   Allergies  Allergen Reactions  . Avapro [Irbesartan] Other (See Comments)    "doesnt sit right with me"--light headed  . Lipitor [Atorvastatin Calcium] Other (See Comments)    Leg pain    Current Outpatient Prescriptions  Medication Sig Dispense Refill  . acetaminophen (TYLENOL) 500 MG tablet Take 1,000 mg by mouth every 6 (six) hours as needed for headache. Reported on 10/08/2015    . albuterol (PROAIR HFA) 108 (90 BASE) MCG/ACT inhaler Inhale 2 puffs into the lungs every 6 (six) hours as needed for wheezing or shortness of breath. 8.5 g 5  . ALPRAZolam (XANAX) 0.25 MG tablet TAKE ONE TABLET BY MOUTH ONCE DAILY AT BEDTIME AS NEEDED 30 tablet 0  . Blood Glucose Calibration (TAI DOC CONTROL) NORMAL SOLN Use as directed.    . Blood Glucose Monitoring Suppl (CLEVER CHEK AUTO-CODE VOICE) DEVI USE AS DIRECTED 1 each 2  . budesonide-formoterol (SYMBICORT) 160-4.5 MCG/ACT inhaler Inhale 2 puffs into the lungs 2 (two) times daily. 1 Inhaler 3  . Cholecalciferol (VITAMIN D3) 5000 UNITS TABS Take 1 tablet by mouth every morning.    . enalapril (VASOTEC) 20 MG tablet Take 1 tablet (20 mg total) by mouth daily. 90 tablet 0  . enalapril (VASOTEC) 20 MG tablet TAKE ONE TABLET BY MOUTH ONCE DAILY 30 tablet 2  . ipratropium-albuterol (DUONEB) 0.5-2.5 (3) MG/3ML SOLN PLACE 1 VIAL (3 MLS) IN THE NEBULIZER EVERY 6 HOURS AS NEEDED 1080 mL 0  . Lancet Devices (LANCING DEVICE) MISC USE AS DIRECTED 1 each 2  . metFORMIN  (GLUCOPHAGE) 500 MG tablet TAKE ONE TABLET BY MOUTH IN THE MORNING AND TAKE TWO TABLETS IN THE EVENING 270 tablet 0  . metoprolol (LOPRESSOR) 100 MG tablet TAKE ONE TABLET BY MOUTH TWICE DAILY 180 tablet 1  . NOVOLIN N RELION 100 UNIT/ML injection INJECT 35 UNITS INTO THE SKIN 2 TIMES DAILY BEFORE A MEAL (Patient taking differently: INJECT 32 UNITS INTO THE SKIN 2 TIMES DAILY BEFORE A MEAL) 40 mL 2  . NOVOLIN R RELION 100 UNIT/ML injection Inject 32 Units into the skin 2 (two) times daily before a meal.    . simvastatin (ZOCOR) 40 MG tablet TAKE ONE TABLET BY MOUTH ONCE DAILY 90 tablet 0  . warfarin (COUMADIN) 5 MG tablet TAKE ONE & ONE-HALF TABLETS BY MOUTH ONCE DAILY EVERY MONDAY AND EVERY FRIDAY THEN TAKE ONE  TABLET THE REST OF THE WEEK 140 tablet 2   No current facility-administered medications for this visit.     Past Medical History:  Diagnosis Date  . Arthritis   . Asthma   . BPH (benign prostatic hypertrophy)   . Colon polyps   . COPD (chronic obstructive pulmonary disease) ([place])   . Diabetes mellitus   . GERD (gastroesophageal reflux disease)   . History of oxygen administration    occ. oxygen use at 2 l/m as needed- not using at this time  . HTN (hypertension)    x 3  years  . Hyperlipidemia   . Lung nodules 03/11/2013  . Mediastinal adenopathy 06/20/2013   CT  & PET 2/15   . 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 NHL (non-Hodgkin's lymphoma) ([place])    nhl dx 9/11  . Obesity    exogenous  . Shingles   . Sleep apnea    recent study 04-07-14 awaiting results, no cpap yet.-Dr. Glynn Octave    Past Surgical History:  Procedure Laterality Date  . APPENDECTOMY    . COLONOSCOPY WITH PROPOFOL N/A 05/05/2014   Procedure: COLONOSCOPY WITH PROPOFOL;  Surgeon: Inda Castle, MD;  Location: [facility];  Service: Endoscopy;  Laterality: N/A;    ROS:  As stated in the HPI and negative for all other systems.  PHYSICAL EXAM BP 128/82   Pulse 70   Ht  '5\' 10"'  (1.778 m)   Wt (!) 316 lb (143.3 kg)   BMI 45.34 kg/m  GENERAL:  Well appearing NECK:  No jugular venous distention, waveform within normal limits, carotid upstroke brisk and symmetric, no bruits, no thyromegaly LUNGS:  Clear to auscultation bilaterally CHEST:  Unremarkable HEART:  PMI not displaced or sustained,S1 and S2 within normal limits, no S3, no clicks, no rubs, no murmurs, irregular ABD:  Flat, positive bowel sounds normal in frequency in pitch, no bruits, no rebound, no guarding, no midline pulsatile mass, no hepatomegaly, no splenomegaly, obese EXT:  2 plus pulses throughout, no edema, no cyanosis no clubbing   EKG:  Atrial fibrillation rate 81, axis within normal limits, intervals within normal limits, diffuse T wave flattening.  12/03/2015   ASSESSMENT AND PLAN  FIBRILLATION, ATRIAL -  He has no symptomatic paroxysms. He tolerates anticoagulation. The patient is not interested in switching to a novel oral anticoagulant.   Justin Ruiz has a CHA2DS2 - VASc score of 2 with a risk of stroke of 2.2%.  Essential hypertension, benign -  The blood pressure is at target. No change in medications is indicated. We will continue with therapeutic lifestyle changes (TLC).  OBESITY, UNSPECIFIED - The patient understands the need to lose weight with diet and exercise. We have discussed specific strategies for this.  He is going to start at the [facility].  DM - His A1c was 7.3.  This is improved.

## 2015-12-03 ENCOUNTER — Encounter: Payer: Self-pay | Admitting: Cardiology

## 2015-12-03 ENCOUNTER — Ambulatory Visit (INDEPENDENT_AMBULATORY_CARE_PROVIDER_SITE_OTHER): Payer: Commercial Managed Care - HMO | Admitting: Cardiology

## 2015-12-03 VITALS — BP 128/82 | HR 70 | Ht 70.0 in | Wt 316.0 lb

## 2015-12-03 DIAGNOSIS — I482 Chronic atrial fibrillation, unspecified: Secondary | ICD-10-CM

## 2015-12-03 NOTE — Patient Instructions (Signed)

## 2015-12-07 DIAGNOSIS — J9611 Chronic respiratory failure with hypoxia: Secondary | ICD-10-CM | POA: Diagnosis not present

## 2015-12-07 DIAGNOSIS — J449 Chronic obstructive pulmonary disease, unspecified: Secondary | ICD-10-CM | POA: Diagnosis not present

## 2015-12-09 DIAGNOSIS — G4733 Obstructive sleep apnea (adult) (pediatric): Secondary | ICD-10-CM | POA: Diagnosis not present

## 2015-12-10 DIAGNOSIS — J449 Chronic obstructive pulmonary disease, unspecified: Secondary | ICD-10-CM | POA: Diagnosis not present

## 2015-12-10 DIAGNOSIS — J439 Emphysema, unspecified: Secondary | ICD-10-CM | POA: Diagnosis not present

## 2015-12-11 ENCOUNTER — Other Ambulatory Visit: Payer: Self-pay | Admitting: Family Medicine

## 2015-12-21 ENCOUNTER — Ambulatory Visit: Payer: Self-pay | Admitting: Pharmacist

## 2015-12-22 ENCOUNTER — Encounter: Payer: Self-pay | Admitting: Family Medicine

## 2015-12-22 ENCOUNTER — Ambulatory Visit (INDEPENDENT_AMBULATORY_CARE_PROVIDER_SITE_OTHER): Payer: Commercial Managed Care - HMO | Admitting: Pharmacist

## 2015-12-22 DIAGNOSIS — I482 Chronic atrial fibrillation, unspecified: Secondary | ICD-10-CM

## 2015-12-22 DIAGNOSIS — Z794 Long term (current) use of insulin: Secondary | ICD-10-CM | POA: Diagnosis not present

## 2015-12-22 DIAGNOSIS — E119 Type 2 diabetes mellitus without complications: Secondary | ICD-10-CM

## 2015-12-22 DIAGNOSIS — Z7901 Long term (current) use of anticoagulants: Secondary | ICD-10-CM | POA: Diagnosis not present

## 2015-12-22 LAB — COAGUCHEK XS/INR WAIVED
INR: 3.3 — AB (ref 0.9–1.1)
PROTHROMBIN TIME: 39.6 s

## 2015-12-23 LAB — MICROALBUMIN / CREATININE URINE RATIO
CREATININE, UR: 237.6 mg/dL
MICROALB/CREAT RATIO: 55.3 mg/g creat — ABNORMAL HIGH (ref 0.0–30.0)
Microalbumin, Urine: 131.4 ug/mL

## 2015-12-29 ENCOUNTER — Other Ambulatory Visit: Payer: Self-pay | Admitting: Family Medicine

## 2016-01-04 ENCOUNTER — Encounter: Payer: Self-pay | Admitting: Internal Medicine

## 2016-01-04 ENCOUNTER — Ambulatory Visit (INDEPENDENT_AMBULATORY_CARE_PROVIDER_SITE_OTHER): Payer: Commercial Managed Care - HMO | Admitting: Internal Medicine

## 2016-01-04 DIAGNOSIS — G4733 Obstructive sleep apnea (adult) (pediatric): Secondary | ICD-10-CM | POA: Diagnosis not present

## 2016-01-04 DIAGNOSIS — J9611 Chronic respiratory failure with hypoxia: Secondary | ICD-10-CM | POA: Diagnosis not present

## 2016-01-04 NOTE — Patient Instructions (Signed)
I think it is important for you to resume regular use of your CPAP all night, every night. If you have problems with it or it isn't cpomfortable, please let us know.  Ok to call for med reffills as needed  Don't forget your flu shot this fall

## 2016-01-04 NOTE — Assessment & Plan Note (Signed)
He indicates he is just not bothered to use his CPAP since he changed bedrooms. I emphasized the importance of using CPAP regularly and letting us know if it is uncomfortable. He has significant comorbidities which will be aggravated by lack of CPAP use. Weight loss is again encouraged.

## 2016-01-04 NOTE — Progress Notes (Signed)
HPI  07/02/2015-65 year old male former smoker followed for COPD/chronic hypoxic respiratory failure, OSA, complicated by history lymphoma, DM, A. fib/Coumadin, morbid obesity CPAP auto 12-20/ [facility]   O2 2L continuous FOLLOWS FOR: Per Dr Laurance Flatten; was in Hospital at [facility] in middle of Feb 2017.  Hospital discharge 06/09/2015-acute on chronic respiratory failure due to CPAP/bronchitis, exacerbation COPD, rapid A. fib when he got off of his beta blocker.Marland Kitchen Has finished Levaquin He says he is back to "baseline". He has been given a diuretic. Does notice seasonal rhinitis with congestion and drainage and asks treatment recommendations. CT chest 04/09/2015 IMPRESSION: 1. Tiny scattered sub 4 mm pulmonary nodules are stable. 2. No adenopathy in the chest. 3. Stable retroperitoneal lymph node. No new or progressive lymph nodes in the abdomen/pelvis. 4. Stable sclerotic and slightly enlarged right tenth rib as discussed above. Electronically Signed  By: Marijo Sanes M.D.  On: 04/09/2015 11: CXR 06/07/2015 1 V IMPRESSION: Vascular congestion noted. Increased interstitial markings may reflect mild pulmonary edema. Electronically Signed  By: Garald Balding M.D.  On: 06/07/2015 02:01  01/04/2016-65 year old male former smoker followed for COPD/chronic hypoxic respiratory failure, OSA, allergic rhinitis, complicated by history lymphoma, DM, A. fib/Coumadin, morbid obesity CPAP auto 12-20/[facility]  O2 2 L continuous Declines flu shot this early- gets from PCP Has not been using CPAP, saying he changed bedrooms and didn't move the machine. Denies CPAP is uncomfortable. Breathing has felt stable with no cough, wheeze or consistent need for portable oxygen. He does sleep with it. Meds are okay.  ROS-see HPI Constitutional:   No-   weight loss, night sweats, fevers, chills, +fatigue, lassitude. HEENT:   No-  headaches, difficulty swallowing, tooth/dental problems, sore throat,       No-   sneezing, itching, ear ache, nasal congestion, post nasal drip,  CV:  No-   chest pain, orthopnea, PND, swelling in lower extremities, anasarca,                                                             dizziness, palpitations Resp: + shortness of breath with exertion or at rest.              No-   productive cough,  No non-productive cough,  No- coughing up of blood.              No-   change in color of mucus.   Skin: No-   rash or lesions. GI:  No-   heartburn, indigestion, abdominal pain, nausea, vomiting,  GU:  MS:  No-   joint pain or swelling.  . Neuro-     nothing unusual Psych:  No- change in mood or affect. No depression or anxiety.  No memory loss.  OBJ- Physical Exam    90% at rest room air General- Alert, Oriented, Affect-appropriate, Distress- none acute, +morbidly obese Skin- rash-none, lesions- none, excoriation- none Lymphadenopathy- none Head- atraumatic            Eyes- Gross vision intact, PERRLA, conjunctivae and secretions clear,             Ears- Hearing, canals-normal            Nose- Clear, no-Septal dev, mucus, polyps, erosion, perforation  Throat- Mallampati II , mucosa clear , drainage- none, tonsils- atrophic Neck- flexible , trachea midline, no stridor , thyroid nl, carotid no bruit Chest - symmetrical excursion , unlabored           Heart/CV- IRR , no murmur , no gallop  , no rub, nl s1 s2                           - JVD- none , edema- none, stasis changes- none, varices- none           Lung- decreased airflow/clear, unlabored, wheeze-none none , dullness-none, rub- none           Chest wall-  Abd-  Br/ Gen/ Rectal- Not done, not indicated Extrem- cyanosis- none, clubbing, none, atrophy- none, strength- nl Neuro- grossly intact to observation

## 2016-01-04 NOTE — Assessment & Plan Note (Signed)
He looks very comfortable sitting quietly with saturation 90% on room air. We discussed oxygen use emphasizing importance of using it for sleep with his CPAP.

## 2016-01-07 DIAGNOSIS — J449 Chronic obstructive pulmonary disease, unspecified: Secondary | ICD-10-CM | POA: Diagnosis not present

## 2016-01-07 DIAGNOSIS — J9611 Chronic respiratory failure with hypoxia: Secondary | ICD-10-CM | POA: Diagnosis not present

## 2016-01-10 DIAGNOSIS — J439 Emphysema, unspecified: Secondary | ICD-10-CM | POA: Diagnosis not present

## 2016-01-10 DIAGNOSIS — J449 Chronic obstructive pulmonary disease, unspecified: Secondary | ICD-10-CM | POA: Diagnosis not present

## 2016-01-25 ENCOUNTER — Other Ambulatory Visit: Payer: Self-pay | Admitting: Family Medicine

## 2016-01-25 ENCOUNTER — Ambulatory Visit (INDEPENDENT_AMBULATORY_CARE_PROVIDER_SITE_OTHER): Payer: Commercial Managed Care - HMO | Admitting: Pharmacist

## 2016-01-25 VITALS — Ht 69.0 in | Wt 308.0 lb

## 2016-01-25 DIAGNOSIS — I482 Chronic atrial fibrillation, unspecified: Secondary | ICD-10-CM

## 2016-01-25 DIAGNOSIS — Z7901 Long term (current) use of anticoagulants: Secondary | ICD-10-CM | POA: Diagnosis not present

## 2016-01-25 LAB — COAGUCHEK XS/INR WAIVED
INR: 2.1 — AB (ref 0.9–1.1)
PROTHROMBIN TIME: 25.5 s

## 2016-02-01 ENCOUNTER — Ambulatory Visit (INDEPENDENT_AMBULATORY_CARE_PROVIDER_SITE_OTHER): Payer: Commercial Managed Care - HMO

## 2016-02-01 DIAGNOSIS — Z23 Encounter for immunization: Secondary | ICD-10-CM | POA: Diagnosis not present

## 2016-02-06 DIAGNOSIS — J449 Chronic obstructive pulmonary disease, unspecified: Secondary | ICD-10-CM | POA: Diagnosis not present

## 2016-02-06 DIAGNOSIS — J9611 Chronic respiratory failure with hypoxia: Secondary | ICD-10-CM | POA: Diagnosis not present

## 2016-02-09 DIAGNOSIS — J449 Chronic obstructive pulmonary disease, unspecified: Secondary | ICD-10-CM | POA: Diagnosis not present

## 2016-02-09 DIAGNOSIS — J439 Emphysema, unspecified: Secondary | ICD-10-CM | POA: Diagnosis not present

## 2016-02-17 ENCOUNTER — Encounter: Payer: Self-pay | Admitting: Family Medicine

## 2016-02-17 ENCOUNTER — Ambulatory Visit (INDEPENDENT_AMBULATORY_CARE_PROVIDER_SITE_OTHER): Payer: Commercial Managed Care - HMO | Admitting: Family Medicine

## 2016-02-17 VITALS — BP 131/80 | HR 85 | Temp 97.3°F | Ht 69.0 in | Wt 307.0 lb

## 2016-02-17 DIAGNOSIS — E7800 Pure hypercholesterolemia, unspecified: Secondary | ICD-10-CM | POA: Diagnosis not present

## 2016-02-17 DIAGNOSIS — I5032 Chronic diastolic (congestive) heart failure: Secondary | ICD-10-CM

## 2016-02-17 DIAGNOSIS — Z794 Long term (current) use of insulin: Secondary | ICD-10-CM | POA: Diagnosis not present

## 2016-02-17 DIAGNOSIS — E559 Vitamin D deficiency, unspecified: Secondary | ICD-10-CM | POA: Diagnosis not present

## 2016-02-17 DIAGNOSIS — E119 Type 2 diabetes mellitus without complications: Secondary | ICD-10-CM | POA: Diagnosis not present

## 2016-02-17 DIAGNOSIS — Z0000 Encounter for general adult medical examination without abnormal findings: Secondary | ICD-10-CM | POA: Diagnosis not present

## 2016-02-17 DIAGNOSIS — C8238 Follicular lymphoma grade IIIa, lymph nodes of multiple sites: Secondary | ICD-10-CM

## 2016-02-17 DIAGNOSIS — I482 Chronic atrial fibrillation, unspecified: Secondary | ICD-10-CM

## 2016-02-17 DIAGNOSIS — K219 Gastro-esophageal reflux disease without esophagitis: Secondary | ICD-10-CM | POA: Diagnosis not present

## 2016-02-17 DIAGNOSIS — I10 Essential (primary) hypertension: Secondary | ICD-10-CM

## 2016-02-17 DIAGNOSIS — C8332 Diffuse large B-cell lymphoma, intrathoracic lymph nodes: Secondary | ICD-10-CM | POA: Diagnosis not present

## 2016-02-17 LAB — BAYER DCA HB A1C WAIVED: HB A1C: 5.2 % (ref <7.0)

## 2016-02-17 NOTE — Patient Instructions (Addendum)
Medicare Annual Wellness Visit  [facility] and the medical providers at [facility] strive to bring you the best medical care.  In doing so we not only want to address your current medical conditions and concerns but also to detect new conditions early and prevent illness, disease and health-related problems.    Medicare offers a yearly Wellness Visit which allows our clinical staff to assess your need for preventative services including immunizations, lifestyle education, counseling to decrease risk of preventable diseases and screening for fall risk and other medical concerns.    This visit is provided free of charge (no copay) for all Medicare recipients. The clinical pharmacists at [facility] have begun to conduct these Wellness Visits which will also include a thorough review of all your medications.    As you primary medical provider recommend that you make an appointment for your Annual Wellness Visit if you have not done so already this year.  You may set up this appointment before you leave today or you may call back [phone] and schedule an appointment.  Please make sure when you call that you mention that you are scheduling your Annual Wellness Visit with the clinical pharmacist so that the appointment may be made for the proper length of time.     Continue current medications. Continue good therapeutic lifestyle changes which include good diet and exercise. Fall precautions discussed with patient. If an FOBT was given today- please return it to our front desk. If you are over 7 years old - you may need Prevnar 27 or the adult Pneumonia vaccine.  **Flu shots are available--- please call and schedule a FLU-CLINIC appointment**  After your visit with [place] today you will receive a survey in the mail or online from [employer] regarding your care with [place]. Please take a moment to fill this out. Your feedback is very  important to [place] as you can help [place] better understand your patient needs as well as improve your experience and satisfaction. WE CARE ABOUT YOU!!!   The patient should continue to follow-up with pulmonology cardiology and oncology Should continue this nice work he is doing with losing weight and we're very proud of him for doing that Continue to monitor blood sugars and feet regularly

## 2016-02-17 NOTE — Progress Notes (Signed)
Subjective:    Patient ID: Justin Ruiz, male    DOB: 08-28-1950, 65 y.o.   MRN: [id number]  HPI Pt here for follow up and management of chronic medical problems which includes diabetes, hyperlipidemia and hypertension. He is taking medications regularly.The patient is doing well overall today with no specific complaints. He does not need any refills. He is due to have a rectal exam today get lab work and have her urinalysis. The patient is really making efforts at losing weight. He says he's lost 30 pounds since his annual exam last year. He is really proud of this and so are we. He sees several specialists and these include the pulmonologist the cardiologist and the oncologist. He has a history of non-Hodgkin's lymphoma and has a visit scheduled in the next month or so with the oncologist. He sees the pulmonologist about every 6 months because of his asthma problems which have been stable and he sees the cardiologist yearly. He denies any chest pain or shortness of breath anymore than usual. He denies any problems with his GI tract except some rare to occasional heartburn. He does not take any medicine for this. He has not seen any blood in the stool or had any black tarry bowel movements and no change in his bowel habits. He is passing his water well without burning pain or frequency. The patient says his fasting blood sugars at home have been running in the low 100 range.  Patient Active Problem List   Diagnosis Date Noted  . 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 Chronic diastolic heart failure ([place])   . CAP (community acquired pneumonia)   . Acute-on-chronic respiratory failure ([place]) 06/07/2015  . Sepsis ([place]) 06/07/2015  . Diabetic neuropathy, type II diabetes mellitus ([place]) 09/25/2014  . Chronic respiratory failure with hypoxia ([place]) 05/25/2014  . Obstructive sleep apnea 05/25/2014    . Benign neoplasm of ascending colon 05/05/2014  . Excessive daytime sleepiness 01/24/2014  . Mediastinal adenopathy 06/20/2013  . Severe obesity (BMI >= 40) ([place]) 05/01/2013  . Generalized anxiety disorder 05/01/2013  . Lung nodules 03/11/2013  . Chronic anticoagulation 08/20/2012  . FIBRILLATION, ATRIAL 05/12/2010  . NHL (non-Hodgkin's lymphoma) ([place]) 01/26/2010  . Obesity, morbid ([place]) 04/27/2009  . CARDIOVASCULAR STUDIES, ABNORMAL 04/27/2009  . ABNORMAL STRESS ELECTROCARDIOGRAM 03/25/2009  . PERSONAL HISTORY OF COLONIC POLYPS 03/02/2009  . Diabetes mellitus type 2, insulin dependent ([place]) 04/29/2007  . Hyperlipidemia 04/29/2007  . Hypertension 04/29/2007  . Seasonal and perennial allergic rhinitis 04/29/2007  . COPD exacerbation ([place]) 04/29/2007  . G E R D 04/29/2007  . BENIGN PROSTATIC HYPERTROPHY, HX OF 04/29/2007   Outpatient Encounter Prescriptions as of 02/17/2016  Medication Sig  . acetaminophen (TYLENOL) 500 MG tablet Take 1,000 mg by mouth every 6 (six) hours as needed for headache. Reported on 10/08/2015  . ALPRAZolam (XANAX) 0.25 MG tablet TAKE ONE TABLET BY MOUTH AT BEDTIME  . Blood Glucose Calibration (TAI DOC CONTROL) NORMAL SOLN Use as directed.  . Blood Glucose Monitoring Suppl (CLEVER CHEK AUTO-CODE VOICE) DEVI USE AS DIRECTED  . budesonide-formoterol (SYMBICORT) 160-4.5 MCG/ACT inhaler Inhale 2 puffs into the lungs 2 (two) times daily.  . Cholecalciferol (VITAMIN D3) 5000 UNITS TABS Take 1 tablet by mouth every morning.  . enalapril (VASOTEC) 20 MG tablet TAKE ONE TABLET BY MOUTH ONCE DAILY  . ipratropium-albuterol (DUONEB) 0.5-2.5 (3) MG/3ML SOLN PLACE 1 VIAL (3 MLS)  IN THE NEBULIZER EVERY 6 HOURS AS NEEDED  . Lancet Devices (LANCING DEVICE) MISC USE AS DIRECTED  . metFORMIN (GLUCOPHAGE) 500 MG tablet TAKE ONE TABLET BY MOUTH IN THE MORNING AND TAKE  TWO TABLETS IN THE EVENING  . metoprolol (LOPRESSOR) 100 MG tablet TAKE ONE TABLET BY MOUTH TWICE DAILY  .  NOVOLIN R RELION 100 UNIT/ML injection INJECT 32 UNITS SUBCUTANEOUSLY TWICE DAILY BEFORE  A  MEAL  . simvastatin (ZOCOR) 40 MG tablet TAKE ONE TABLET BY MOUTH ONCE DAILY  . warfarin (COUMADIN) 5 MG tablet TAKE ONE & ONE-HALF TABLETS BY MOUTH ONCE DAILY EVERY MONDAY AND EVERY FRIDAY THEN TAKE ONE  TABLET THE REST OF THE WEEK  . albuterol (PROAIR HFA) 108 (90 BASE) MCG/ACT inhaler Inhale 2 puffs into the lungs every 6 (six) hours as needed for wheezing or shortness of breath.  . [DISCONTINUED] NOVOLIN N RELION 100 UNIT/ML injection INJECT 35 UNITS INTO THE SKIN 2 TIMES DAILY BEFORE A MEAL (Patient taking differently: INJECT 32 UNITS INTO THE SKIN 2 TIMES DAILY BEFORE A MEAL)   No facility-administered encounter medications on file as of 02/17/2016.       Review of Systems  Constitutional: Negative.   HENT: Negative.   Eyes: Negative.   Respiratory: Negative.   Cardiovascular: Negative.   Gastrointestinal: Negative.   Endocrine: Negative.   Genitourinary: Negative.   Musculoskeletal: Negative.   Skin: Negative.   Allergic/Immunologic: Negative.   Neurological: Negative.   Hematological: Negative.   Psychiatric/Behavioral: Negative.        Objective:   Physical Exam  Constitutional: He is oriented to person, place, and time. He appears well-developed and well-nourished. No distress.  Pleasant and alert and in no acute distress.  HENT:  Head: Normocephalic and atraumatic.  Right Ear: External ear normal.  Left Ear: External ear normal.  Mouth/Throat: Oropharynx is clear and moist. No oropharyngeal exudate.  Minimal nasal congestion bilaterally  Eyes: Conjunctivae and EOM are normal. Pupils are equal, round, and reactive to light. Right eye exhibits no discharge. Left eye exhibits no discharge. No scleral icterus.  Neck: Normal range of motion. Neck supple. No thyromegaly present.  No bruits thyromegaly or anterior cervical adenopathy  Cardiovascular: Normal rate and intact distal  pulses.   No murmur heard. The heart is irregular irregular at 84/m  Pulmonary/Chest: Effort normal and breath sounds normal. No respiratory distress. He has no wheezes. He has no rales. He exhibits no tenderness.  Clear anteriorly and posteriorly no axillary adenopathy  Abdominal: Soft. Bowel sounds are normal. He exhibits no mass. There is no tenderness. There is no rebound and no guarding.  Abdominal obesity with out liver or spleen enlargement bruits or masses. No inguinal adenopathy palpable.  Genitourinary: Rectum normal and penis normal.  Genitourinary Comments: Because of the buttock size it was hard to feel the entire prostate. The portion that I can feel definite feels enlarged and smooth. There are no rectal masses. There are no inguinal hernias palpable. External genitalia were within normal limits.  Musculoskeletal: Normal range of motion. He exhibits no edema.  Lymphadenopathy:    He has no cervical adenopathy.  Neurological: He is alert and oriented to person, place, and time. He has normal reflexes. No cranial nerve deficit.  Skin: Skin is warm and dry. No rash noted.  Psychiatric: He has a normal mood and affect. His behavior is normal. Judgment and thought content normal.  Nursing note and vitals reviewed.   BP 131/80 (BP Location: Left  Arm)   Pulse 85   Temp 97.3 F (36.3 C) (Oral)   Ht '5\' 9"'  (1.753 m)   Wt (!) 307 lb (139.3 kg)   BMI 45.34 kg/m        Assessment & Plan:  1. Diabetes mellitus type 2, insulin dependent ([place]) -The patient's blood sugars at home have been good. We will continue current treatment and aggressive therapeutic lifestyle changes pending results of lab work - Bayer DCA Hb A1c Waived - BMP8+EGFR - CBC with Differential/Platelet  2. Pure hypercholesterolemia -Continue current treatment pending results of lab work - CBC with Differential/Platelet - NMR, lipoprofile  3. Vitamin D deficiency -Continue current treatment pending results  of lab work - CBC with Differential/Platelet - VITAMIN D 25 Hydroxy (Vit-D Deficiency, Fractures)  4. Gastroesophageal reflux disease, esophagitis presence not specified -The patient says he only has minimal heartburn and does not take anything for this currently. - CBC with Differential/Platelet  5. Hypertension -The blood pressure is good and he will continue with current treatment - BMP8+EGFR - CBC with Differential/Platelet - Hepatic function panel  6. Health care maintenance -Continue to follow-up with pulmonology for his asthma and bronchospasm, cardiology for his atrial fib, and oncology for his non-Hodgkin's lymphoma. - CBC with Differential/Platelet - PSA, total and free - Urinalysis, Complete  7. Chronic atrial fibrillation (HCC) -The patient remains in atrial fib with a rate of 84/m he will continue to follow-up with cardiology  8. Chronic diastolic heart failure (HCC) -No rales no edema today and he will continue to follow-up with cardiology  9. Morbid obesity due to excess calories ([place]) -The patient says he's lost 30 pounds of weight since his last physical exam and this is great. He will continue with his current diet habits making all efforts to continue to lose more weight.  10. 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  In doing so we not only want to address your current medical conditions and concerns but also to detect new conditions early and prevent illness, disease and health-related problems.    Medicare offers a yearly Wellness Visit which allows our clinical staff to assess your need for  preventative services including immunizations, lifestyle education, counseling to decrease risk of preventable diseases and screening for fall risk and other medical concerns.    This visit is provided free of charge (no copay) for all Medicare recipients. The clinical pharmacists at [facility] have begun to conduct these Wellness Visits which will also include a thorough review of all your medications.    As you primary medical provider recommend that you make an appointment for your Annual Wellness Visit if you have not done so already this year.  You may set up this appointment before you leave today or you may call back ([phone]) and schedule an appointment.  Please make sure when you call that you mention that you are scheduling your Annual Wellness Visit with the clinical pharmacist so that the appointment may be made for the proper length of time.     Continue current  medications. Continue good therapeutic lifestyle changes which include good diet and exercise. Fall precautions discussed with patient. If an FOBT was given today- please return it to our front desk. If you are over 27 years old - you may need Prevnar 66 or the adult Pneumonia vaccine.  **Flu shots are available--- please call and schedule a FLU-CLINIC appointment**  After your visit with [place] today you will receive a survey in the mail or online from [employer] regarding your care with [place]. Please take a moment to fill this out. Your feedback is very important to [place] as you can help [place] better understand your patient needs as well as improve your experience and satisfaction. WE CARE ABOUT YOU!!!   The patient should continue to follow-up with pulmonology cardiology and oncology Should continue this nice work he is doing with losing weight and we're very proud of him for doing that Continue to monitor blood sugars and feet regularly  Arrie Senate MD

## 2016-02-18 LAB — BMP8+EGFR
BUN / CREAT RATIO: 24 (ref 10–24)
BUN: 19 mg/dL (ref 8–27)
CO2: 28 mmol/L (ref 18–29)
CREATININE: 0.79 mg/dL (ref 0.76–1.27)
Calcium: 9 mg/dL (ref 8.6–10.2)
Chloride: 100 mmol/L (ref 96–106)
GFR calc non Af Amer: 94 mL/min/{1.73_m2} (ref >59)
GFR, EST AFRICAN AMERICAN: 109 mL/min/{1.73_m2} (ref >59)
Glucose: 168 mg/dL — ABNORMAL HIGH (ref 65–99)
Potassium: 4.6 mmol/L (ref 3.5–5.2)
SODIUM: 141 mmol/L (ref 134–144)

## 2016-02-18 LAB — CBC WITH DIFFERENTIAL/PLATELET
Basophils Absolute: 0 10*3/uL (ref 0.0–0.2)
Basos: 0 %
EOS (ABSOLUTE): 0.1 10*3/uL (ref 0.0–0.4)
EOS: 1 %
HEMATOCRIT: 43.2 % (ref 37.5–51.0)
HEMOGLOBIN: 14.4 g/dL (ref 12.6–17.7)
Immature Grans (Abs): 0 10*3/uL (ref 0.0–0.1)
Immature Granulocytes: 0 %
LYMPHS ABS: 1.4 10*3/uL (ref 0.7–3.1)
Lymphs: 24 %
MCH: 30.7 pg (ref 26.6–33.0)
MCHC: 33.3 g/dL (ref 31.5–35.7)
MCV: 92 fL (ref 79–97)
MONOCYTES: 6 %
MONOS ABS: 0.4 10*3/uL (ref 0.1–0.9)
NEUTROS ABS: 4 10*3/uL (ref 1.4–7.0)
Neutrophils: 69 %
Platelets: 159 10*3/uL (ref 150–379)
RBC: 4.69 x10E6/uL (ref 4.14–5.80)
RDW: 13.4 % (ref 12.3–15.4)
WBC: 5.9 10*3/uL (ref 3.4–10.8)

## 2016-02-18 LAB — PSA, TOTAL AND FREE
PROSTATE SPECIFIC AG, SERUM: 1.1 ng/mL (ref 0.0–4.0)
PSA FREE PCT: 38.2 %
PSA, Free: 0.42 ng/mL

## 2016-02-18 LAB — HEPATIC FUNCTION PANEL
ALBUMIN: 3.8 g/dL (ref 3.6–4.8)
ALK PHOS: 69 IU/L (ref 39–117)
ALT: 12 IU/L (ref 0–44)
AST: 14 IU/L (ref 0–40)
Bilirubin Total: 0.9 mg/dL (ref 0.0–1.2)
Bilirubin, Direct: 0.25 mg/dL (ref 0.00–0.40)
Total Protein: 6.3 g/dL (ref 6.0–8.5)

## 2016-02-18 LAB — NMR, LIPOPROFILE
CHOLESTEROL: 140 mg/dL (ref 100–199)
HDL Cholesterol by NMR: 47 mg/dL (ref >39)
HDL Particle Number: 34.3 umol/L (ref >=30.5)
LDL PARTICLE NUMBER: 769 nmol/L (ref <1000)
LDL SIZE: 20.4 nm (ref >20.5)
LDL-C: 61 mg/dL (ref 0–99)
LP-IR SCORE: 51 — AB (ref <=45)
Small LDL Particle Number: 342 nmol/L (ref <=527)
Triglycerides by NMR: 159 mg/dL — ABNORMAL HIGH (ref 0–149)

## 2016-02-18 LAB — VITAMIN D 25 HYDROXY (VIT D DEFICIENCY, FRACTURES): VIT D 25 HYDROXY: 44.5 ng/mL (ref 30.0–100.0)

## 2016-02-29 ENCOUNTER — Other Ambulatory Visit: Payer: Self-pay | Admitting: Family Medicine

## 2016-03-01 NOTE — Telephone Encounter (Signed)
Xanax 0.25 mg, 1 at bedtime as needed, #30 with no refills called into [facility] per MMM

## 2016-03-01 NOTE — Telephone Encounter (Signed)
Please call in alpazolam with 0 refills

## 2016-03-08 ENCOUNTER — Other Ambulatory Visit: Payer: Self-pay | Admitting: Family Medicine

## 2016-03-11 DIAGNOSIS — J9611 Chronic respiratory failure with hypoxia: Secondary | ICD-10-CM | POA: Diagnosis not present

## 2016-03-11 DIAGNOSIS — J449 Chronic obstructive pulmonary disease, unspecified: Secondary | ICD-10-CM | POA: Diagnosis not present

## 2016-03-11 DIAGNOSIS — J439 Emphysema, unspecified: Secondary | ICD-10-CM | POA: Diagnosis not present

## 2016-03-16 ENCOUNTER — Telehealth: Payer: Self-pay | Admitting: Family Medicine

## 2016-03-24 NOTE — Telephone Encounter (Signed)
Dr. Annamaria Boots looks after his oxygen. I asked [employer] to contact them

## 2016-03-28 ENCOUNTER — Inpatient Hospital Stay (HOSPITAL_COMMUNITY)
Admission: EM | Admit: 2016-03-28 | Discharge: 2016-04-04 | DRG: 291 | Disposition: A | Discharge status: Home Health | Payer: Commercial Managed Care - HMO | Attending: Nephrology | Admitting: Nephrology

## 2016-03-28 ENCOUNTER — Emergency Department (HOSPITAL_COMMUNITY): Payer: Commercial Managed Care - HMO

## 2016-03-28 ENCOUNTER — Encounter (HOSPITAL_COMMUNITY): Payer: Self-pay | Admitting: Emergency Medicine

## 2016-03-28 DIAGNOSIS — R0602 Shortness of breath: Secondary | ICD-10-CM

## 2016-03-28 DIAGNOSIS — T502X5A Adverse effect of carbonic-anhydrase inhibitors, benzothiadiazides and other diuretics, initial encounter: Secondary | ICD-10-CM | POA: Diagnosis present

## 2016-03-28 DIAGNOSIS — R061 Stridor: Secondary | ICD-10-CM | POA: Diagnosis not present

## 2016-03-28 DIAGNOSIS — E11649 Type 2 diabetes mellitus with hypoglycemia without coma: Secondary | ICD-10-CM | POA: Diagnosis not present

## 2016-03-28 DIAGNOSIS — J9620 Acute and chronic respiratory failure, unspecified whether with hypoxia or hypercapnia: Secondary | ICD-10-CM | POA: Diagnosis not present

## 2016-03-28 DIAGNOSIS — E785 Hyperlipidemia, unspecified: Secondary | ICD-10-CM | POA: Diagnosis present

## 2016-03-28 DIAGNOSIS — E162 Hypoglycemia, unspecified: Secondary | ICD-10-CM | POA: Diagnosis not present

## 2016-03-28 DIAGNOSIS — G9340 Encephalopathy, unspecified: Secondary | ICD-10-CM | POA: Diagnosis not present

## 2016-03-28 DIAGNOSIS — I509 Heart failure, unspecified: Secondary | ICD-10-CM | POA: Diagnosis not present

## 2016-03-28 DIAGNOSIS — E872 Acidosis: Secondary | ICD-10-CM | POA: Diagnosis not present

## 2016-03-28 DIAGNOSIS — T380X5A Adverse effect of glucocorticoids and synthetic analogues, initial encounter: Secondary | ICD-10-CM | POA: Diagnosis present

## 2016-03-28 DIAGNOSIS — E875 Hyperkalemia: Secondary | ICD-10-CM | POA: Diagnosis present

## 2016-03-28 DIAGNOSIS — I4891 Unspecified atrial fibrillation: Secondary | ICD-10-CM | POA: Diagnosis not present

## 2016-03-28 DIAGNOSIS — J9622 Acute and chronic respiratory failure with hypercapnia: Secondary | ICD-10-CM | POA: Diagnosis not present

## 2016-03-28 DIAGNOSIS — Z86010 Personal history of colonic polyps: Secondary | ICD-10-CM

## 2016-03-28 DIAGNOSIS — Z7984 Long term (current) use of oral hypoglycemic drugs: Secondary | ICD-10-CM

## 2016-03-28 DIAGNOSIS — E1165 Type 2 diabetes mellitus with hyperglycemia: Secondary | ICD-10-CM | POA: Diagnosis present

## 2016-03-28 DIAGNOSIS — C8332 Diffuse large B-cell lymphoma, intrathoracic lymph nodes: Secondary | ICD-10-CM | POA: Diagnosis present

## 2016-03-28 DIAGNOSIS — Z9981 Dependence on supplemental oxygen: Secondary | ICD-10-CM

## 2016-03-28 DIAGNOSIS — Z8249 Family history of ischemic heart disease and other diseases of the circulatory system: Secondary | ICD-10-CM

## 2016-03-28 DIAGNOSIS — I4892 Unspecified atrial flutter: Secondary | ICD-10-CM | POA: Diagnosis not present

## 2016-03-28 DIAGNOSIS — J9621 Acute and chronic respiratory failure with hypoxia: Secondary | ICD-10-CM | POA: Diagnosis present

## 2016-03-28 DIAGNOSIS — J90 Pleural effusion, not elsewhere classified: Secondary | ICD-10-CM | POA: Diagnosis not present

## 2016-03-28 DIAGNOSIS — N400 Enlarged prostate without lower urinary tract symptoms: Secondary | ICD-10-CM | POA: Diagnosis present

## 2016-03-28 DIAGNOSIS — J440 Chronic obstructive pulmonary disease with acute lower respiratory infection: Secondary | ICD-10-CM | POA: Diagnosis present

## 2016-03-28 DIAGNOSIS — G4733 Obstructive sleep apnea (adult) (pediatric): Secondary | ICD-10-CM | POA: Diagnosis present

## 2016-03-28 DIAGNOSIS — M1990 Unspecified osteoarthritis, unspecified site: Secondary | ICD-10-CM | POA: Diagnosis present

## 2016-03-28 DIAGNOSIS — I5031 Acute diastolic (congestive) heart failure: Secondary | ICD-10-CM | POA: Diagnosis present

## 2016-03-28 DIAGNOSIS — J189 Pneumonia, unspecified organism: Secondary | ICD-10-CM | POA: Diagnosis present

## 2016-03-28 DIAGNOSIS — I110 Hypertensive heart disease with heart failure: Secondary | ICD-10-CM | POA: Diagnosis not present

## 2016-03-28 DIAGNOSIS — E119 Type 2 diabetes mellitus without complications: Secondary | ICD-10-CM

## 2016-03-28 DIAGNOSIS — J441 Chronic obstructive pulmonary disease with (acute) exacerbation: Secondary | ICD-10-CM | POA: Diagnosis not present

## 2016-03-28 DIAGNOSIS — J449 Chronic obstructive pulmonary disease, unspecified: Secondary | ICD-10-CM

## 2016-03-28 DIAGNOSIS — Z6841 Body Mass Index (BMI) 40.0 and over, adult: Secondary | ICD-10-CM

## 2016-03-28 DIAGNOSIS — I10 Essential (primary) hypertension: Secondary | ICD-10-CM | POA: Diagnosis present

## 2016-03-28 DIAGNOSIS — R0902 Hypoxemia: Secondary | ICD-10-CM

## 2016-03-28 DIAGNOSIS — Z880 Allergy status to penicillin: Secondary | ICD-10-CM

## 2016-03-28 DIAGNOSIS — Z794 Long term (current) use of insulin: Secondary | ICD-10-CM

## 2016-03-28 DIAGNOSIS — Z9119 Patient's noncompliance with other medical treatment and regimen: Secondary | ICD-10-CM

## 2016-03-28 DIAGNOSIS — Z888 Allergy status to other drugs, medicaments and biological substances status: Secondary | ICD-10-CM

## 2016-03-28 DIAGNOSIS — C8330 Diffuse large B-cell lymphoma, unspecified site: Secondary | ICD-10-CM | POA: Diagnosis not present

## 2016-03-28 DIAGNOSIS — G9341 Metabolic encephalopathy: Secondary | ICD-10-CM | POA: Diagnosis present

## 2016-03-28 DIAGNOSIS — K219 Gastro-esophageal reflux disease without esophagitis: Secondary | ICD-10-CM | POA: Diagnosis present

## 2016-03-28 DIAGNOSIS — R4182 Altered mental status, unspecified: Secondary | ICD-10-CM | POA: Diagnosis not present

## 2016-03-28 DIAGNOSIS — I5032 Chronic diastolic (congestive) heart failure: Secondary | ICD-10-CM | POA: Insufficient documentation

## 2016-03-28 DIAGNOSIS — Z7901 Long term (current) use of anticoagulants: Secondary | ICD-10-CM

## 2016-03-28 DIAGNOSIS — M6281 Muscle weakness (generalized): Secondary | ICD-10-CM

## 2016-03-28 DIAGNOSIS — Z87891 Personal history of nicotine dependence: Secondary | ICD-10-CM

## 2016-03-28 DIAGNOSIS — Z79899 Other long term (current) drug therapy: Secondary | ICD-10-CM

## 2016-03-28 HISTORY — DX: Encephalopathy, unspecified: G93.40

## 2016-03-28 HISTORY — DX: Chronic obstructive pulmonary disease, unspecified: J44.9

## 2016-03-28 HISTORY — DX: Acute diastolic (congestive) heart failure: I50.31

## 2016-03-28 LAB — URINALYSIS, ROUTINE W REFLEX MICROSCOPIC
BILIRUBIN URINE: NEGATIVE
Glucose, UA: 100 mg/dL — AB
Ketones, ur: NEGATIVE mg/dL
Leukocytes, UA: NEGATIVE
Nitrite: NEGATIVE
Protein, ur: 100 mg/dL — AB
SPECIFIC GRAVITY, URINE: 1.025 (ref 1.005–1.030)
pH: 6 (ref 5.0–8.0)

## 2016-03-28 LAB — I-STAT CHEM 8, ED
BUN: 32 mg/dL — ABNORMAL HIGH (ref 6–20)
CHLORIDE: 100 mmol/L — AB (ref 101–111)
CREATININE: 0.9 mg/dL (ref 0.61–1.24)
Calcium, Ion: 1.18 mmol/L (ref 1.15–1.40)
GLUCOSE: 171 mg/dL — AB (ref 65–99)
HEMATOCRIT: 45 % (ref 39.0–52.0)
HEMOGLOBIN: 15.3 g/dL (ref 13.0–17.0)
POTASSIUM: 4.9 mmol/L (ref 3.5–5.1)
Sodium: 139 mmol/L (ref 135–145)
TCO2: 32 mmol/L (ref 0–100)

## 2016-03-28 LAB — BLOOD GAS, ARTERIAL
ACID-BASE EXCESS: 1.9 mmol/L (ref 0.0–2.0)
Acid-Base Excess: 2.8 mmol/L — ABNORMAL HIGH (ref 0.0–2.0)
BICARBONATE: 24 mmol/L (ref 20.0–28.0)
Bicarbonate: 26.1 mmol/L (ref 20.0–28.0)
Delivery systems: POSITIVE
Drawn by: 234301
Drawn by: 277331
EXPIRATORY PAP: 8
FIO2: 100
FIO2: 0.4
INSPIRATORY PAP: 16
LHR: 8 {breaths}/min
O2 SAT: 96.8 %
O2 Saturation: 99.2 %
PATIENT TEMPERATURE: 37
PCO2 ART: 48.4 mmHg — AB (ref 32.0–48.0)
PH ART: 7.224 — AB (ref 7.350–7.450)
pCO2 arterial: 72.6 mmHg (ref 32.0–48.0)
pH, Arterial: 7.373 (ref 7.350–7.450)
pO2, Arterial: 267 mmHg — ABNORMAL HIGH (ref 83.0–108.0)
pO2, Arterial: 95.9 mmHg (ref 83.0–108.0)

## 2016-03-28 LAB — COMPREHENSIVE METABOLIC PANEL
ALK PHOS: 98 U/L (ref 38–126)
ALT: 21 U/L (ref 17–63)
AST: 23 U/L (ref 15–41)
Albumin: 2.9 g/dL — ABNORMAL LOW (ref 3.5–5.0)
Anion gap: 6 (ref 5–15)
BILIRUBIN TOTAL: 0.6 mg/dL (ref 0.3–1.2)
BUN: 30 mg/dL — AB (ref 6–20)
CALCIUM: 8.8 mg/dL — AB (ref 8.9–10.3)
CO2: 33 mmol/L — ABNORMAL HIGH (ref 22–32)
CREATININE: 0.84 mg/dL (ref 0.61–1.24)
Chloride: 99 mmol/L — ABNORMAL LOW (ref 101–111)
GFR calc Af Amer: >60 mL/min (ref >60)
Glucose, Bld: 182 mg/dL — ABNORMAL HIGH (ref 65–99)
POTASSIUM: 4.9 mmol/L (ref 3.5–5.1)
Sodium: 138 mmol/L (ref 135–145)
TOTAL PROTEIN: 7.1 g/dL (ref 6.5–8.1)

## 2016-03-28 LAB — CBC WITH DIFFERENTIAL/PLATELET
BASOS ABS: 0 10*3/uL (ref 0.0–0.1)
BASOS PCT: 0 %
EOS ABS: 0 10*3/uL (ref 0.0–0.7)
EOS PCT: 0 %
HCT: 44.9 % (ref 39.0–52.0)
Hemoglobin: 14.2 g/dL (ref 13.0–17.0)
Lymphocytes Relative: 16 %
Lymphs Abs: 1.4 10*3/uL (ref 0.7–4.0)
MCH: 30.9 pg (ref 26.0–34.0)
MCHC: 31.6 g/dL (ref 30.0–36.0)
MCV: 97.8 fL (ref 78.0–100.0)
MONO ABS: 0.5 10*3/uL (ref 0.1–1.0)
Monocytes Relative: 6 %
Neutro Abs: 6.7 10*3/uL (ref 1.7–7.7)
Neutrophils Relative %: 78 %
PLATELETS: 287 10*3/uL (ref 150–400)
RBC: 4.59 MIL/uL (ref 4.22–5.81)
RDW: 13.3 % (ref 11.5–15.5)
WBC: 8.6 10*3/uL (ref 4.0–10.5)

## 2016-03-28 LAB — TROPONIN I: TROPONIN I: 0.03 ng/mL — AB (ref <0.03)

## 2016-03-28 LAB — URINE MICROSCOPIC-ADD ON

## 2016-03-28 LAB — PROTIME-INR
INR: 2.63
PROTHROMBIN TIME: 28.6 s — AB (ref 11.4–15.2)

## 2016-03-28 LAB — I-STAT TROPONIN, ED
TROPONIN I, POC: 0.01 ng/mL (ref 0.00–0.08)
Troponin i, poc: 0.02 ng/mL (ref 0.00–0.08)

## 2016-03-28 LAB — CBG MONITORING, ED
GLUCOSE-CAPILLARY: 120 mg/dL — AB (ref 65–99)
Glucose-Capillary: 127 mg/dL — ABNORMAL HIGH (ref 65–99)

## 2016-03-28 LAB — I-STAT CG4 LACTIC ACID, ED: Lactic Acid, Venous: 0.87 mmol/L (ref 0.5–1.9)

## 2016-03-28 LAB — GLUCOSE, CAPILLARY
GLUCOSE-CAPILLARY: 214 mg/dL — AB (ref 65–99)
Glucose-Capillary: 176 mg/dL — ABNORMAL HIGH (ref 65–99)
Glucose-Capillary: 361 mg/dL — ABNORMAL HIGH (ref 65–99)

## 2016-03-28 LAB — MRSA PCR SCREENING: MRSA by PCR: NEGATIVE

## 2016-03-28 LAB — TSH: TSH: 0.325 u[IU]/mL — AB (ref 0.350–4.500)

## 2016-03-28 LAB — LACTIC ACID, PLASMA: Lactic Acid, Venous: 1.4 mmol/L (ref 0.5–1.9)

## 2016-03-28 LAB — BRAIN NATRIURETIC PEPTIDE: B Natriuretic Peptide: 475 pg/mL — ABNORMAL HIGH (ref 0.0–100.0)

## 2016-03-28 MED ORDER — IPRATROPIUM-ALBUTEROL 0.5-2.5 (3) MG/3ML IN SOLN
3.0000 mL | Freq: Four times a day (QID) | RESPIRATORY_TRACT | Status: DC
  Administered 2016-03-28 – 2016-03-29 (×5): 3 mL via RESPIRATORY_TRACT
  Filled 2016-03-28 (×5): qty 3

## 2016-03-28 MED ORDER — DEXTROSE 5 % IV SOLN
1.0000 g | Freq: Once | INTRAVENOUS | Status: AC
  Administered 2016-03-28: 1 g via INTRAVENOUS
  Filled 2016-03-28: qty 10

## 2016-03-28 MED ORDER — ACETAMINOPHEN 325 MG PO TABS: 650.0000 mg | ORAL_TABLET | ORAL | Status: DC | PRN

## 2016-03-28 MED ORDER — FUROSEMIDE 10 MG/ML IJ SOLN
40.0000 mg | Freq: Two times a day (BID) | INTRAMUSCULAR | Status: DC
  Administered 2016-03-28 – 2016-03-31 (×6): 40 mg via INTRAVENOUS
  Filled 2016-03-28 (×6): qty 4

## 2016-03-28 MED ORDER — WARFARIN - PHARMACIST DOSING INPATIENT
Freq: Every day | Status: DC
  Administered 2016-03-30 – 2016-03-31 (×2)

## 2016-03-28 MED ORDER — HYDRALAZINE HCL 20 MG/ML IJ SOLN
5.0000 mg | Freq: Once | INTRAMUSCULAR | Status: AC
  Administered 2016-03-28: 5 mg via INTRAVENOUS
  Filled 2016-03-28: qty 1

## 2016-03-28 MED ORDER — METHYLPREDNISOLONE SODIUM SUCC 125 MG IJ SOLR
INTRAMUSCULAR | Status: AC
  Filled 2016-03-28: qty 2

## 2016-03-28 MED ORDER — SODIUM CHLORIDE 0.9 % IV BOLUS (SEPSIS)
1000.0000 mL | Freq: Once | INTRAVENOUS | Status: AC
  Administered 2016-03-28: 1000 mL via INTRAVENOUS

## 2016-03-28 MED ORDER — WARFARIN SODIUM 5 MG PO TABS: 5.0000 mg | ORAL_TABLET | Freq: Every day | ORAL | Status: DC

## 2016-03-28 MED ORDER — SODIUM CHLORIDE 0.9% FLUSH
3.0000 mL | Freq: Two times a day (BID) | INTRAVENOUS | Status: DC
  Administered 2016-03-28 – 2016-04-04 (×12): 3 mL via INTRAVENOUS

## 2016-03-28 MED ORDER — DILTIAZEM HCL 25 MG/5ML IV SOLN
10.0000 mg | Freq: Once | INTRAVENOUS | Status: AC
  Administered 2016-03-28: 10 mg via INTRAVENOUS

## 2016-03-28 MED ORDER — METHYLPREDNISOLONE SODIUM SUCC 125 MG IJ SOLR
125.0000 mg | Freq: Once | INTRAMUSCULAR | Status: AC
  Administered 2016-03-28: 125 mg via INTRAVENOUS
  Filled 2016-03-28: qty 2

## 2016-03-28 MED ORDER — WARFARIN SODIUM 5 MG PO TABS
5.0000 mg | ORAL_TABLET | Freq: Once | ORAL | Status: AC
  Administered 2016-03-28: 5 mg via ORAL
  Filled 2016-03-28: qty 1

## 2016-03-28 MED ORDER — VANCOMYCIN HCL IN DEXTROSE 1-5 GM/200ML-% IV SOLN
1000.0000 mg | Freq: Three times a day (TID) | INTRAVENOUS | Status: DC
  Administered 2016-03-28 – 2016-03-29 (×2): 1000 mg via INTRAVENOUS
  Filled 2016-03-28 (×2): qty 200

## 2016-03-28 MED ORDER — DILTIAZEM HCL 100 MG IV SOLR
5.0000 mg/h | Freq: Once | INTRAVENOUS | Status: AC
  Administered 2016-03-28: 5 mg/h via INTRAVENOUS
  Filled 2016-03-28: qty 100

## 2016-03-28 MED ORDER — SIMVASTATIN 20 MG PO TABS
40.0000 mg | ORAL_TABLET | Freq: Every day | ORAL | Status: DC
  Administered 2016-03-28 – 2016-04-03 (×7): 40 mg via ORAL
  Filled 2016-03-28 (×6): qty 2

## 2016-03-28 MED ORDER — INSULIN ASPART 100 UNIT/ML ~~LOC~~ SOLN
0.0000 [IU] | SUBCUTANEOUS | Status: DC
  Administered 2016-03-28: 2 [IU] via SUBCUTANEOUS
  Administered 2016-03-29 (×3): 9 [IU] via SUBCUTANEOUS

## 2016-03-28 MED ORDER — SODIUM CHLORIDE 0.9% FLUSH: 3.0000 mL | INTRAVENOUS | Status: DC | PRN

## 2016-03-28 MED ORDER — METOPROLOL TARTRATE 50 MG PO TABS
100.0000 mg | ORAL_TABLET | Freq: Two times a day (BID) | ORAL | Status: DC
  Administered 2016-03-28 – 2016-04-04 (×14): 100 mg via ORAL
  Filled 2016-03-28 (×14): qty 2

## 2016-03-28 MED ORDER — IPRATROPIUM-ALBUTEROL 0.5-2.5 (3) MG/3ML IN SOLN
3.0000 mL | Freq: Once | RESPIRATORY_TRACT | Status: AC
  Administered 2016-03-28: 3 mL via RESPIRATORY_TRACT
  Filled 2016-03-28: qty 3

## 2016-03-28 MED ORDER — MORPHINE SULFATE (PF) 2 MG/ML IV SOLN
2.0000 mg | Freq: Once | INTRAVENOUS | Status: AC
  Administered 2016-03-28: 2 mg via INTRAVENOUS
  Filled 2016-03-28: qty 1

## 2016-03-28 MED ORDER — DEXTROSE 5 % IV SOLN
5.0000 mg/h | INTRAVENOUS | Status: DC
  Administered 2016-03-28: 10 mg/h via INTRAVENOUS
  Filled 2016-03-28: qty 100

## 2016-03-28 MED ORDER — ONDANSETRON HCL 4 MG/2ML IJ SOLN: 4.0000 mg | Freq: Four times a day (QID) | INTRAMUSCULAR | Status: DC | PRN

## 2016-03-28 MED ORDER — LEVOFLOXACIN IN D5W 750 MG/150ML IV SOLN
750.0000 mg | INTRAVENOUS | Status: DC
  Administered 2016-03-28 – 2016-04-01 (×5): 750 mg via INTRAVENOUS
  Filled 2016-03-28 (×5): qty 150

## 2016-03-28 MED ORDER — ALBUTEROL SULFATE (2.5 MG/3ML) 0.083% IN NEBU
2.5000 mg | INHALATION_SOLUTION | Freq: Once | RESPIRATORY_TRACT | Status: AC
  Administered 2016-03-28: 2.5 mg via RESPIRATORY_TRACT
  Filled 2016-03-28: qty 3

## 2016-03-28 MED ORDER — METHYLPREDNISOLONE SODIUM SUCC 125 MG IJ SOLR
60.0000 mg | Freq: Two times a day (BID) | INTRAMUSCULAR | Status: DC
  Administered 2016-03-28 – 2016-03-30 (×4): 60 mg via INTRAVENOUS
  Filled 2016-03-28 (×4): qty 2

## 2016-03-28 MED ORDER — SODIUM CHLORIDE 0.9 % IV SOLN
250.0000 mL | INTRAVENOUS | Status: DC | PRN
  Administered 2016-03-30: 250 mL via INTRAVENOUS

## 2016-03-28 MED ORDER — ENALAPRIL MALEATE 5 MG PO TABS
20.0000 mg | ORAL_TABLET | Freq: Every day | ORAL | Status: DC
  Administered 2016-03-28: 20 mg via ORAL
  Filled 2016-03-28: qty 4

## 2016-03-28 MED ORDER — VANCOMYCIN HCL IN DEXTROSE 1-5 GM/200ML-% IV SOLN
1000.0000 mg | Freq: Once | INTRAVENOUS | Status: AC
  Administered 2016-03-28: 1000 mg via INTRAVENOUS
  Filled 2016-03-28: qty 200

## 2016-03-28 NOTE — ED Notes (Signed)
Warm blanket provided. Irregular Heart Rate still noted.

## 2016-03-28 NOTE — ED Notes (Signed)
cardizem bolus fiunished at 1117.

## 2016-03-28 NOTE — ED Notes (Signed)
Receiving Unit took report but reported pt has not been assigned a bed at this time. RN reported charge nurse at lunch and would assign pt bed once returned.

## 2016-03-28 NOTE — Progress Notes (Signed)
ANTICOAGULATION CONSULT NOTE / ANTIBIOTICS - Initial Consult  Pharmacy Consult for Coumadin (chronic Rx PTA) and Vancomycin Indication: Afib, pneumonia  Allergies  Allergen Reactions  . Avapro [Irbesartan] Other (See Comments)    "doesnt sit right with me"--light headed  . Lipitor [Atorvastatin Calcium] Other (See Comments)    Leg pain  . Penicillins     Unknown reaction   Patient Measurements: Height: 6' (182.9 cm) Weight: 300 lb (136.1 kg) IBW/kg (Calculated) : 77.6  Vital Signs: Temp: 98.5 F (36.9 C) (12/04 1616) Temp Source: Axillary (12/04 1616) BP: 132/72 (12/04 1430) Pulse Rate: 229 (12/04 1616)  Labs:  Recent Labs  03/28/16 1101 03/28/16 1106  HGB 14.2 15.3  HCT 44.9 45.0  PLT 287  --   LABPROT 28.6*  --   INR 2.63  --   CREATININE 0.84 0.90   Estimated Creatinine Clearance: 116.9 mL/min (by C-G formula based on SCr of 0.9 mg/dL).  Medical History: Past Medical History:  Diagnosis Date  . Arthritis   . Asthma   . BPH (benign prostatic hypertrophy)   . Colon polyps   . COPD (chronic obstructive pulmonary disease) ([place])   . Diabetes mellitus   . GERD (gastroesophageal reflux disease)   . History of oxygen administration    occ. oxygen use at 2 l/m as needed- not using at this time  . HTN (hypertension)    x 3 years  . Hyperlipidemia   . Lung nodules 03/11/2013  . Mediastinal adenopathy 06/20/2013   CT  & PET 2/15   . 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 NHL (non-Hodgkin's lymphoma) ([place])    nhl dx 9/11  . Obesity    exogenous  . Shingles   . Sleep apnea    recent study 04-07-14 awaiting results, no cpap yet.-Dr. Glynn Octave   Medications:  Prescriptions Prior to Admission  Medication Sig Dispense Refill Last Dose  . acetaminophen (TYLENOL) 500 MG tablet Take 1,000 mg by mouth every 6 (six) hours as needed for headache. Reported on 10/08/2015   unknown  . ALPRAZolam (XANAX) 0.25 MG tablet TAKE ONE TABLET BY MOUTH AT  BEDTIME 30 tablet 2 unknown  . Cholecalciferol (VITAMIN D3) 5000 UNITS TABS Take 1 tablet by mouth every morning.   unknown  . enalapril (VASOTEC) 20 MG tablet TAKE ONE TABLET BY MOUTH ONCE DAILY 30 tablet 5 unknown at Unknown time  . guaiFENesin (MUCINEX) 600 MG 12 hr tablet Take 600 mg by mouth 2 (two) times daily as needed for cough or to loosen phlegm.   Past Week at Unknown time  . insulin NPH Human (HUMULIN N,NOVOLIN N) 100 UNIT/ML injection Inject 35 Units into the skin daily before breakfast.    unknown  . ipratropium-albuterol (DUONEB) 0.5-2.5 (3) MG/3ML SOLN PLACE 1 VIAL (3 MLS) IN THE NEBULIZER EVERY 6 HOURS AS NEEDED 1080 mL 0 Past Week at Unknown time  . metFORMIN (GLUCOPHAGE) 500 MG tablet TAKE ONE TABLET BY MOUTH IN THE MORNING AND TAKE  TWO TABLETS IN THE EVENING 270 tablet 0 unknown  . metoprolol (LOPRESSOR) 100 MG tablet TAKE ONE TABLET BY MOUTH TWICE DAILY 180 tablet 1 unknown  . NOVOLIN R RELION 100 UNIT/ML injection INJECT 32 UNITS SUBCUTANEOUSLY TWICE A DAY BEFORE A MEAL 20 mL 2 unknown  . simvastatin (ZOCOR) 40 MG tablet TAKE ONE TABLET BY MOUTH ONCE DAILY 90 tablet 0 unknown  . warfarin (COUMADIN) 5 MG tablet TAKE ONE & ONE-HALF TABLETS BY MOUTH ONCE DAILY  EVERY MONDAY AND EVERY FRIDAY THEN TAKE ONE  TABLET THE REST OF THE WEEK 140 tablet 2 unknown  . albuterol (PROAIR HFA) 108 (90 BASE) MCG/ACT inhaler Inhale 2 puffs into the lungs every 6 (six) hours as needed for wheezing or shortness of breath. 8.5 g 5 Taking  . Blood Glucose Calibration (TAI DOC CONTROL) NORMAL SOLN Use as directed.   Taking  . Blood Glucose Monitoring Suppl (CLEVER CHEK AUTO-CODE VOICE) DEVI USE AS DIRECTED 1 each 2 Taking  . Lancet Devices (LANCING DEVICE) MISC USE AS DIRECTED 1 each 2 Taking   Assessment: 65yo male presented to ED unresponsive and with low blood sugar.  Asked to resume home Warfarin and initiate Vancomycin for pna.  Pt is morbidly obese with good renal fxn.  Pt received initial Vanc  dose in ED INR therapeutic on admission.  Last Coumadin dose taken is unknown / not reported.   Goal of Therapy:  INR 2-3 Monitor platelets by anticoagulation protocol: Yes Vancomycin trough level 15-20  Plan:  Coumadin 18m today x 1 Vancomycin 1gm IV q8hrs, check trough at steady state Levaquin 7547mIV q24hrs per MD Monitor labs, renal fxn, progress, INR, c/s  HaHart Robinsons 03/28/2016,5:04 PM

## 2016-03-28 NOTE — ED Notes (Signed)
Prepping pt for temp foley. Penis noted to have watery discharge coming from penis. Site cleaned and prepped. Insertion of temp foley by Marciano Sequin and assisted by this RN.

## 2016-03-28 NOTE — ED Triage Notes (Signed)
Per EMS, pt from home, cbg 25, amp d50 administered blood sugar 56, another amp d50 blood sugar 160. Pt began posturing en route. Pt alert and cool to touch, on nonrebreather at time of arrival. RT and EDP at bedside.

## 2016-03-28 NOTE — ED Notes (Addendum)
CRITICAL VALUE ALERT  Critical value received:  Ph 7.224, pco2 72.6, po2 267 Date of notification:  03/28/2016  Time of notification:  1108  Critical value read back:Yes.    Nurse who received alert:  Harvin Hazel  Responding MD:  zammit  Time MD responded:  [phone]

## 2016-03-28 NOTE — ED Notes (Addendum)
Pt back from CT. Pt interacting with family. Pt attempting to cough. Pt repositioned.   Pt family reports productive cough for last several weeks.

## 2016-03-28 NOTE — ED Provider Notes (Signed)
[facility] DEPT Provider Note   CSN: [id number] Arrival date & time: 03/28/16  1050  By signing my name below, I, Higinio Plan, attest that this documentation has been prepared under the direction and in the presence of Milton Ferguson, MD . Electronically Signed: Higinio Plan, Scribe. 03/28/2016. 11:01 AM.  History   Chief Complaint Chief Complaint  Patient presents with  . Hypoglycemia   Groin the wife the patient has had a cough and congestion for a couple weeks.  She found him unresponsive this morning and when the paramedics got there his sugar was 25   The history is provided by the EMS personnel. No language interpreter was used.  Hypoglycemia  Initial blood sugar:  25 Blood sugar after intervention:  55 Severity:  Severe Onset quality:  Sudden Timing:  Constant Progression:  Improving Chronicity:  Recurrent Diabetic status:  Controlled with insulin  LEVEL V CAVEAT and Limited ROS due to AMS  HPI Comments: MAXAMILIAN AMADON is a 65 y.o. male with PMHx of DM, COPD, and HTN, brought in by EMS from home to the Emergency Department for an evaluation of AMS due to  hypoglycemia. Per EMS, pt's wife stated he was last seen well last night. EMS reports pt's CBG was 25 upon arrival but increased to 160 en route to ED.   Past Medical History:  Diagnosis Date  . Arthritis   . Asthma   . BPH (benign prostatic hypertrophy)   . Colon polyps   . COPD (chronic obstructive pulmonary disease) ([place])   . Diabetes mellitus   . GERD (gastroesophageal reflux disease)   . History of oxygen administration    occ. oxygen use at 2 l/m as needed- not using at this time  . HTN (hypertension)    x 3 years  . Hyperlipidemia   . Lung nodules 03/11/2013  . Mediastinal adenopathy 06/20/2013   CT  & PET 2/15   . 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 NHL (non-Hodgkin's lymphoma) ([place])    nhl dx 9/11  . Obesity    exogenous  . Shingles   . Sleep apnea    recent study  04-07-14 awaiting results, no cpap yet.-Dr. Glynn Octave    Patient Active Problem List   Diagnosis Date Noted  . 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 Chronic diastolic heart failure ([place])   . CAP (community acquired pneumonia)   . Acute-on-chronic respiratory failure ([place]) 06/07/2015  . Sepsis ([place]) 06/07/2015  . Diabetic neuropathy, type II diabetes mellitus ([place]) 09/25/2014  . Chronic respiratory failure with hypoxia ([place]) 05/25/2014  . Obstructive sleep apnea 05/25/2014  . Benign neoplasm of ascending colon 05/05/2014  . Excessive daytime sleepiness 01/24/2014  . Mediastinal adenopathy 06/20/2013  . Severe obesity (BMI >= 40) ([place]) 05/01/2013  . Generalized anxiety disorder 05/01/2013  . Lung nodules 03/11/2013  . Chronic anticoagulation 08/20/2012  . FIBRILLATION, ATRIAL 05/12/2010  . NHL (non-Hodgkin's lymphoma) ([place]) 01/26/2010  . Obesity, morbid ([place]) 04/27/2009  . CARDIOVASCULAR STUDIES, ABNORMAL 04/27/2009  . ABNORMAL STRESS ELECTROCARDIOGRAM 03/25/2009  . PERSONAL HISTORY OF COLONIC POLYPS 03/02/2009  . Diabetes mellitus type 2, insulin dependent ([place]) 04/29/2007  . Hyperlipidemia 04/29/2007  . Hypertension 04/29/2007  . Seasonal and perennial allergic rhinitis 04/29/2007  . COPD exacerbation ([place]) 04/29/2007  . G E R D 04/29/2007  . BENIGN PROSTATIC HYPERTROPHY, HX OF 04/29/2007    Past Surgical History:  Procedure Laterality Date  . APPENDECTOMY    . COLONOSCOPY WITH PROPOFOL N/A 05/05/2014   Procedure: COLONOSCOPY WITH PROPOFOL;  Surgeon: Inda Castle, MD;  Location: [facility] ENDOSCOPY;  Service: Endoscopy;  Laterality: N/A;       Home Medications    Prior to Admission medications   Medication Sig Start Date End Date Taking? Authorizing Provider  acetaminophen (TYLENOL) 500 MG tablet Take 1,000 mg by mouth every 6 (six) hours as needed for headache.  Reported on 10/08/2015    Historical Provider, MD  albuterol (PROAIR HFA) 108 (90 BASE) MCG/ACT inhaler Inhale 2 puffs into the lungs every 6 (six) hours as needed for wheezing or shortness of breath. 12/26/14 02/16/16  Deneise Lever, MD  ALPRAZolam Duanne Moron) 0.25 MG tablet TAKE ONE TABLET BY MOUTH AT BEDTIME 03/01/16   Mary-Margaret Hassell Done, FNP  Blood Glucose Calibration (TAI DOC CONTROL) NORMAL SOLN Use as directed. 09/10/13   Historical Provider, MD  Blood Glucose Monitoring Suppl (CLEVER CHEK AUTO-CODE VOICE) DEVI USE AS DIRECTED 11/13/12   Chipper Herb, MD  budesonide-formoterol [facility]) 160-4.5 MCG/ACT inhaler Inhale 2 puffs into the lungs 2 (two) times daily. 02/08/14   Chipper Herb, MD  Cholecalciferol (VITAMIN D3) 5000 UNITS TABS Take 1 tablet by mouth every morning.    Historical Provider, MD  enalapril (VASOTEC) 20 MG tablet TAKE ONE TABLET BY MOUTH ONCE DAILY 03/01/16   Mary-Margaret Hassell Done, FNP  ipratropium-albuterol (DUONEB) 0.5-2.5 (3) MG/3ML SOLN PLACE 1 VIAL (3 MLS) IN THE NEBULIZER EVERY 6 HOURS AS NEEDED 11/10/14   Chipper Herb, MD  Lancet Devices (LANCING DEVICE) MISC USE AS DIRECTED 11/13/12   Chipper Herb, MD  metFORMIN (GLUCOPHAGE) 500 MG tablet TAKE ONE TABLET BY MOUTH IN THE MORNING AND TAKE  TWO TABLETS IN THE EVENING 01/26/16   Chipper Herb, MD  metoprolol (LOPRESSOR) 100 MG tablet TAKE ONE TABLET BY MOUTH TWICE DAILY 11/02/15   Chipper Herb, MD  NOVOLIN R RELION 100 UNIT/ML injection INJECT 32 UNITS SUBCUTANEOUSLY TWICE A DAY BEFORE A MEAL 03/08/16   Chipper Herb, MD  simvastatin (ZOCOR) 40 MG tablet TAKE ONE TABLET BY MOUTH ONCE DAILY 01/26/16   Chipper Herb, MD  warfarin (COUMADIN) 5 MG tablet TAKE ONE & ONE-HALF TABLETS BY MOUTH ONCE DAILY EVERY MONDAY AND EVERY FRIDAY THEN TAKE ONE  TABLET THE REST OF THE WEEK 06/29/15   Cherre Robins, PharmD    Family History Family History  Problem Relation Age of Onset  . Liver cancer Mother   . Diabetes Mother   . Heart  disease Mother   . Colon cancer Father   . Prostate cancer Father   . Colon polyps Father   . Pulmonary embolism Sister   . Diabetes Sister   . Heart attack Sister   . Aortic aneurysm Sister   . COPD Sister   . Heart disease Sister   . COPD Sister   . Heart disease Sister   . Diabetes Maternal Grandmother     Social History Social History  Substance Use Topics  . Smoking status: Former Smoker    Packs/day: 2.00    Years: 40.00    Start date: 12/17/1968    Quit date: 11/14/2004  . Smokeless tobacco: Never Used     Comment: 2 ppd   . Alcohol use No     Comment: previous     Allergies   Avapro [irbesartan] and Lipitor [atorvastatin calcium]   Review of Systems Review  of Systems  Unable to perform ROS: Mental status change   Physical Exam Updated Vital Signs Ht 6' (1.829 m)   Wt 300 lb (136.1 kg)   BMI 40.69 kg/m   Physical Exam  Constitutional: He is oriented to person, place, and time.  Modeled all over.   HENT:  Head: Normocephalic.  Eyes: Conjunctivae and EOM are normal. No scleral icterus.  Neck: Neck supple. No thyromegaly present.  Cardiovascular: Tachycardia present.  Exam reveals no gallop and no friction rub.   No murmur heard.  Irregular tachycardia.  Pulmonary/Chest: No stridor. He has wheezes. He has no rales. He exhibits no tenderness.  Minimal wheezing bilaterally.  Abdominal: He exhibits no distension. There is no tenderness. There is no rebound.  Musculoskeletal: Normal range of motion. He exhibits no edema.   Severe weakness in his lower extremities.  Lymphadenopathy:    He has no cervical adenopathy.  Neurological: He is oriented to person, place, and time. He exhibits normal muscle tone. Coordination normal.  Lethargic, he will respond to some commands. Cannot answer questions.  Skin: No rash noted. No erythema.  Psychiatric: He has a normal mood and affect. His behavior is normal.   ED Treatments / Results  Labs (all labs ordered are  listed, but only abnormal results are displayed) Labs Reviewed - No data to display  EKG  EKG Interpretation None       Radiology No results found.  Procedures Procedures (including critical care time)  Medications Ordered in ED Medications - No data to display  DIAGNOSTIC STUDIES:     COORDINATION OF CARE:  10:56 AM Discussed treatment plan with pt at bedside and pt agreed to plan.  Initial Impression / Assessment and Plan / ED Course  I have reviewed the triage vital signs and the nursing notes.  Pertinent labs & imaging results that were available during my care of the patient were reviewed by me and considered in my medical decision making (see chart for details).  Clinical Course    CRITICAL CARE Performed by: Emalynn Clewis L Total critical care time: 40 minutes Critical care time was exclusive of separately billable procedures and treating other patients. Critical care was necessary to treat or prevent imminent or life-threatening deterioration. Critical care was time spent personally by me on the following activities: development of treatment plan with patient and/or surrogate as well as nursing, discussions with consultants, evaluation of patient's response to treatment, examination of patient, obtaining history from patient or surrogate, ordering and performing treatments and interventions, ordering and review of laboratory studies, ordering and review of radiographic studies, pulse oximetry and re-evaluation of patient's condition.  Final Clinical Impressions(s) / ED Diagnoses   Final diagnoses:  None   Patient appears to have pneumonia and rapid atrial flutter along with hypoglycemic episode.  Patient responding to fluids and BiPAP and will be admitted to stepdown New Prescriptions New Prescriptions   No medications on file  The chart was scribed for me under my direct supervision.  I personally performed the history, physical, and medical decision making  and all procedures in the evaluation of this patient.Milton Ferguson, MD 03/28/16 [id number]

## 2016-03-28 NOTE — H&P (Signed)
History and Physical    Justin Ruiz YTK:[id number] DOB: 07/20/1950 DOA: 03/28/2016  PCP: Redge Gainer, MD  Patient coming from: home  Chief Complaint: unresponsive  HPI: Justin Ruiz is a 65 y.o. male with medical history significant of diabetes, obstructive sleep apnea noncompliant with CPAP, atrial fibrillation, COPD, hypertension, non-Hodgkin's lymphoma. Patient's wife reports for the past 2 weeks he's had a persistent cough, shortness of breath and wheezing. She feels as an upper respiratory tract infection that has not improved. There is no reported fevers. His by mouth intake has been less than normal. He takes insulin for diabetes she has noted that he has had episodes of hypoglycemia. He has dropped down into the 30s this past week. This morning when she found him, he was unresponsive and "foaming at the mouth". Blood sugar was checked and was noted to be 25. EMS was called and he received D50 with improvement of his blood sugar. She has not noted him complain of any chest pain, worsening edema.  ED Course: On arrival to the emergency room, blood sugar was noted to be improving, but did show elevated PCO2 and evidence of respiratory acidosis. Chest x-ray showed questionable pneumonia as well as CHF. BMETand CBC were relatively unremarkable. He was also noted to be in rapid atrial fibrillation and was started on a Cardizem infusion. He was also noted to be hypothermic. He received nebulizer treatments, was started on BiPAP and received antibiotics as well as steroids.  Review of Systems: limited due to patient's mental status. History provided by his wife. As per HPI otherwise 10 point review of systems negative.    Past Medical History:  Diagnosis Date  . Arthritis   . Asthma   . BPH (benign prostatic hypertrophy)   . Colon polyps   . COPD (chronic obstructive pulmonary disease) ([place])   . Diabetes mellitus   . GERD (gastroesophageal reflux disease)   . History of oxygen  administration    occ. oxygen use at 2 l/m as needed- not using at this time  . HTN (hypertension)    x 3 years  . Hyperlipidemia   . Lung nodules 03/11/2013  . Mediastinal adenopathy 06/20/2013   CT  & PET 2/15   . 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 NHL (non-Hodgkin's lymphoma) ([place])    nhl dx 9/11  . Obesity    exogenous  . Shingles   . Sleep apnea    recent study 04-07-14 awaiting results, no cpap yet.-Dr. Glynn Octave    Past Surgical History:  Procedure Laterality Date  . APPENDECTOMY    . COLONOSCOPY WITH PROPOFOL N/A 05/05/2014   Procedure: COLONOSCOPY WITH PROPOFOL;  Surgeon: Inda Castle, MD;  Location: [facility];  Service: Endoscopy;  Laterality: N/A;     reports that he quit smoking about 11 years ago. He started smoking about 47 years ago. He has a 80.00 pack-year smoking history. He has never used smokeless tobacco. He reports that he does not drink alcohol or use drugs.  Allergies  Allergen Reactions  . Avapro [Irbesartan] Other (See Comments)    "doesnt sit right with me"--light headed  . Lipitor [Atorvastatin Calcium] Other (See Comments)    Leg pain  . Penicillins     Unknown reaction    Family History  Problem Relation Age of Onset  . Liver cancer Mother   . Diabetes Mother   . Heart disease Mother   . Colon cancer Father   . Prostate  cancer Father   . Colon polyps Father   . Pulmonary embolism Sister   . Diabetes Sister   . Heart attack Sister   . Aortic aneurysm Sister   . COPD Sister   . Heart disease Sister   . COPD Sister   . Heart disease Sister   . Diabetes Maternal Grandmother      Prior to Admission medications   Medication Sig Start Date End Date Taking? Authorizing Provider  acetaminophen (TYLENOL) 500 MG tablet Take 1,000 mg by mouth every 6 (six) hours as needed for headache. Reported on 10/08/2015   Yes Historical Provider, MD  ALPRAZolam Duanne Moron) 0.25 MG tablet TAKE ONE TABLET BY MOUTH AT BEDTIME  03/01/16  Yes Mary-Margaret Hassell Done, FNP  Cholecalciferol (VITAMIN D3) 5000 UNITS TABS Take 1 tablet by mouth every morning.   Yes Historical Provider, MD  enalapril (VASOTEC) 20 MG tablet TAKE ONE TABLET BY MOUTH ONCE DAILY 03/01/16  Yes Mary-Margaret Hassell Done, FNP  guaiFENesin (MUCINEX) 600 MG 12 hr tablet Take 600 mg by mouth 2 (two) times daily as needed for cough or to loosen phlegm.   Yes Historical Provider, MD  insulin NPH Human (HUMULIN N,NOVOLIN N) 100 UNIT/ML injection Inject 35 Units into the skin daily before breakfast.    Yes Historical Provider, MD  ipratropium-albuterol (DUONEB) 0.5-2.5 (3) MG/3ML SOLN PLACE 1 VIAL (3 MLS) IN THE NEBULIZER EVERY 6 HOURS AS NEEDED 11/10/14  Yes Chipper Herb, MD  metFORMIN (GLUCOPHAGE) 500 MG tablet TAKE ONE TABLET BY MOUTH IN THE MORNING AND TAKE  TWO TABLETS IN THE EVENING 01/26/16  Yes Chipper Herb, MD  metoprolol (LOPRESSOR) 100 MG tablet TAKE ONE TABLET BY MOUTH TWICE DAILY 11/02/15  Yes Chipper Herb, MD  NOVOLIN R RELION 100 UNIT/ML injection INJECT 32 UNITS SUBCUTANEOUSLY TWICE A DAY BEFORE A MEAL 03/08/16  Yes Chipper Herb, MD  simvastatin (ZOCOR) 40 MG tablet TAKE ONE TABLET BY MOUTH ONCE DAILY 01/26/16  Yes Chipper Herb, MD  warfarin (COUMADIN) 5 MG tablet TAKE ONE & ONE-HALF TABLETS BY MOUTH ONCE DAILY EVERY MONDAY AND EVERY FRIDAY THEN TAKE ONE  TABLET THE REST OF THE WEEK 06/29/15  Yes Cherre Robins, PharmD  albuterol (PROAIR HFA) 108 (90 BASE) MCG/ACT inhaler Inhale 2 puffs into the lungs every 6 (six) hours as needed for wheezing or shortness of breath. 12/26/14 02/16/16  Deneise Lever, MD  Blood Glucose Calibration (TAI DOC CONTROL) NORMAL SOLN Use as directed. 09/10/13   Historical Provider, MD  Blood Glucose Monitoring Suppl (CLEVER CHEK AUTO-CODE VOICE) DEVI USE AS DIRECTED 11/13/12   Chipper Herb, MD  Lancet Devices (LANCING DEVICE) MISC USE AS DIRECTED 11/13/12   Chipper Herb, MD    Physical Exam: Vitals:   03/28/16 1430 03/28/16  1445 03/28/16 1537 03/28/16 1616  BP: 132/72     Pulse: 100 99 96 (!) 229  Resp:  '24 26 18  ' Temp: 98.6 F (37 C) 98.8 F (37.1 C)  98.5 F (36.9 C)  TempSrc:    Axillary  SpO2: 100% 99% 99% 94%  Weight:      Height:          Constitutional: NAD, calm, comfortable Vitals:   03/28/16 1430 03/28/16 1445 03/28/16 1537 03/28/16 1616  BP: 132/72     Pulse: 100 99 96 (!) 229  Resp:  '24 26 18  ' Temp: 98.6 F (37 C) 98.8 F (37.1 C)  98.5 F (36.9 C)  TempSrc:  Axillary  SpO2: 100% 99% 99% 94%  Weight:      Height:       Eyes: PERRL, lids and conjunctivae normal ENMT: Mucous membranes are moist. Posterior pharynx clear of any exudate or lesions.Normal dentition.  Neck: normal, supple, no masses, no thyromegaly Respiratory: clear to auscultation bilaterally, no wheezing, no crackles. Normal respiratory effort. No accessory muscle use.  Cardiovascular: irregular, no murmurs / rubs / gallops.2+ extremity edema. 2+ pedal pulses. No carotid bruits.  Abdomen: no tenderness, no masses palpated. No hepatosplenomegaly. Bowel sounds positive.  Musculoskeletal: no clubbing / cyanosis. No joint deformity upper and lower extremities. Good ROM, no contractures. Normal muscle tone.  Skin: no rashes, lesions, ulcers. No induration Neurologic: CN 2-12 grossly intact. Sensation intact, DTR normal. Strength 5/5 in all 4.  Psychiatric: disoriented.     Labs on Admission: I have personally reviewed following labs and imaging studies  CBC:  Recent Labs Lab 03/28/16 1101 03/28/16 1106  WBC 8.6  --   NEUTROABS 6.7  --   HGB 14.2 15.3  HCT 44.9 45.0  MCV 97.8  --   PLT 287  --    Basic Metabolic Panel:  Recent Labs Lab 03/28/16 1101 03/28/16 1106  NA 138 139  K 4.9 4.9  CL 99* 100*  CO2 33*  --   GLUCOSE 182* 171*  BUN 30* 32*  CREATININE 0.84 0.90  CALCIUM 8.8*  --    GFR: Estimated Creatinine Clearance: 116.9 mL/min (by C-G formula based on SCr of 0.9 mg/dL). Liver  Function Tests:  Recent Labs Lab 03/28/16 1101  AST 23  ALT 21  ALKPHOS 98  BILITOT 0.6  PROT 7.1  ALBUMIN 2.9*   No results for input(s): LIPASE, AMYLASE in the last 168 hours. No results for input(s): AMMONIA in the last 168 hours. Coagulation Profile:  Recent Labs Lab 03/28/16 1101  INR 2.63   Cardiac Enzymes: No results for input(s): CKTOTAL, CKMB, CKMBINDEX, TROPONINI in the last 168 hours. BNP (last 3 results) No results for input(s): PROBNP in the last 8760 hours. HbA1C: No results for input(s): HGBA1C in the last 72 hours. CBG:  Recent Labs Lab 03/28/16 1103 03/28/16 1307  GLUCAP 127* 120*   Lipid Profile: No results for input(s): CHOL, HDL, LDLCALC, TRIG, CHOLHDL, LDLDIRECT in the last 72 hours. Thyroid Function Tests: No results for input(s): TSH, T4TOTAL, FREET4, T3FREE, THYROIDAB in the last 72 hours. Anemia Panel: No results for input(s): VITAMINB12, FOLATE, FERRITIN, TIBC, IRON, RETICCTPCT in the last 72 hours. Urine analysis:    Component Value Date/Time   COLORURINE YELLOW 03/28/2016 1129   APPEARANCEUR HAZY (A) 03/28/2016 1129   LABSPEC 1.025 03/28/2016 1129   PHURINE 6.0 03/28/2016 1129   GLUCOSEU 100 (A) 03/28/2016 1129   HGBUR MODERATE (A) 03/28/2016 1129   BILIRUBINUR NEGATIVE 03/28/2016 1129   BILIRUBINUR neg 05/01/2013 1115   KETONESUR NEGATIVE 03/28/2016 1129   PROTEINUR 100 (A) 03/28/2016 1129   UROBILINOGEN negative 05/01/2013 1115   UROBILINOGEN 0.2 05/18/2010 0352   NITRITE NEGATIVE 03/28/2016 1129   LEUKOCYTESUR NEGATIVE 03/28/2016 1129   Sepsis Labs: !!!!!!!!!!!!!!!!!!!!!!!!!!!!!!!!!!!!!!!!!!!! '@[email or web address]' (procalcitonin:4,lacticidven:4) ) Recent Results (from the past 240 hour(s))  Blood Culture (routine x 2)     Status: None (Preliminary result)   Collection Time: 03/28/16 11:01 AM  Result Value Ref Range Status   Specimen Description BLOOD DRAWN BY IV THERAPY  Final   Special Requests BOTTLES DRAWN AEROBIC AND ANAEROBIC  6 CC EACH  Final   Culture PENDING  Incomplete   Report Status PENDING  Incomplete  Blood Culture (routine x 2)     Status: None (Preliminary result)   Collection Time: 03/28/16 11:17 AM  Result Value Ref Range Status   Specimen Description BLOOD RIGHT WRIST  Final   Special Requests BOTTLES DRAWN AEROBIC AND ANAEROBIC 5 CC EACH  Final   Culture PENDING  Incomplete   Report Status PENDING  Incomplete     Radiological Exams on Admission: Ct Head Wo Contrast  Result Date: 03/28/2016 CLINICAL DATA:  Hypoglycemia.  COPD.  Altered mental status. EXAM: CT HEAD WITHOUT CONTRAST TECHNIQUE: Contiguous axial images were obtained from the base of the skull through the vertex without intravenous contrast. COMPARISON:  None. FINDINGS: Brain: No evidence of acute infarction, hemorrhage, extra-axial collection, ventriculomegaly, or mass effect. Generalized cerebral atrophy. Periventricular white matter low attenuation likely secondary to microangiopathy. Vascular: Cerebrovascular atherosclerotic calcifications are noted. Skull: Negative for fracture or focal lesion. Sinuses/Orbits: Visualized portions of the orbits are unremarkable. Visualized portions of the paranasal sinuses and mastoid air cells are unremarkable. Other: None. IMPRESSION: 1. No acute intracranial pathology. 2. Chronic microvascular disease and cerebral atrophy. Electronically Signed   By: Kathreen Devoid   On: 03/28/2016 12:31   Dg Chest Portable 1 View  Result Date: 03/28/2016 CLINICAL DATA:  Shortness of breath. Patient's history of COPD, former smoker, hypertension, diabetes. EXAM: PORTABLE CHEST 1 VIEW COMPARISON:  Portable chest x-ray of June 07, 2015 FINDINGS: The lungs are well-expanded. The interstitial markings are increased bilaterally. There is patchy density at the left lung base partially obscuring the heart border. The cardiac silhouette is enlarged. The pulmonary vascularity is engorged. The interstitial markings are increased.  IMPRESSION: COPD. CHF with pulmonary interstitial edema bilaterally. Patchy atelectasis or infiltrate in the lingula. The appearance of the chest has deteriorated since the February 2017 study. Electronically Signed   By: David  Martinique M.D.   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 Acute on chronic respiratory failure. Likely related to congestive heart failure as well as pneumonia. The patient has underlying COPD. Currently on BiPAP due to elevated PCO2. We'll continue BiPAP for now. Repeat ABG.  2. Acute on chronic congestive heart failure. Previous echo from 2011 indicated a possible diastolic failure. This will be repeated. He is already on an ACE inhibitor and beta blocker. Start on IV Lasix. Monitor intake and output.  3. Atrial fibrillation with rapid ventricular response. Currently on Cardizem infusion. We'll continue home dose of Lopressor and wean down Cardizem as he tolerates. He is anticoagulated with Coumadin.  4. Community acquired pneumonia. He has been coughing for the past 2 weeks and chest x-ray shows possible pneumonia. He's been started on intravenous antibiotics. Follow-up sputum cultures.  5. Metabolic encephalopathy. Likely related to hypoglycemia as well as elevated PCO2. It is unknown how long the patient was hypoglycemia before he was found. Since then hypoglycemia has improved. Will follow-up ABG to monitor PCO2 . CT head was  negative.  6.  Diabetes. Patient is insulin-dependent. Wife reports that his dose has not changed recently but his by mouth intake has declined due to his illness. Will check hemoglobin A1c. Hold basal insulin at this point and use sliding scale.  7. COPD. Continue on bronchodilators and antibiotics. We'll start the patient on IV steroids.  8. Hypertension. Continue home medications. Blood pressure is currently stable.  9. Diffuse large B-cell lymphoma. Follow-up with oncology.   DVT prophylaxis: coumadin Code Status: full code Family Communication: discussed with wife at the bedside Disposition Plan: discharge home once imrpoved Consults called:  Admission status: stepdown   Tachina Spoonemore MD [facility] Pager [phone]  If 7PM-7AM, please contact night-coverage [email or web address] Password [email or web address]  03/28/2016, 4:54 PM

## 2016-03-28 NOTE — ED Notes (Signed)
cbg of 120. 

## 2016-03-28 NOTE — Progress Notes (Signed)
**Note De-Identified  Obfuscation** Patient removed from BIPAP and placed on 4L HFNC; tolerating well.

## 2016-03-28 NOTE — ED Notes (Addendum)
Pt wife at bedside and reports pt has had head cold for last several weeks and has been taking otc mucinex. Pt wife reports was "normal" yesterday.   EDP also reports chest xray showed mild HF. EDP reported to stop fluids at this time. EDP consulted and inquired about fluids  And code sepsis workup. EDP reported to hold of on fluids at this time.

## 2016-03-28 NOTE — ED Notes (Signed)
Initial set of blood cultures obtained with IV start. Lab at bedside to obtain second set of cultures.

## 2016-03-28 NOTE — ED Notes (Signed)
Second set of blood cultures obtained at this time.

## 2016-03-28 NOTE — ED Notes (Signed)
[facility] reported pt going to room 3. Pt and pt family aware. RT aware and reported would be down to aid in pt transfer to [facility].

## 2016-03-28 NOTE — ED Notes (Signed)
IV site flushed post medication administration. Pt reports pain with flushing. Blood return noted to IV site. IV site flushed well. No signs of infiltration at this time.

## 2016-03-28 NOTE — ED Notes (Signed)
Generalized trembling improving.

## 2016-03-28 NOTE — ED Notes (Addendum)
Pt taken to CT accompanied by this RN and RT.

## 2016-03-28 NOTE — ED Notes (Signed)
Attempted report and receiving unit reported unable to take report at this time.

## 2016-03-29 ENCOUNTER — Inpatient Hospital Stay (HOSPITAL_COMMUNITY): Payer: Commercial Managed Care - HMO

## 2016-03-29 DIAGNOSIS — I10 Essential (primary) hypertension: Secondary | ICD-10-CM

## 2016-03-29 DIAGNOSIS — I509 Heart failure, unspecified: Secondary | ICD-10-CM

## 2016-03-29 LAB — GLUCOSE, CAPILLARY
GLUCOSE-CAPILLARY: 388 mg/dL — AB (ref 65–99)
GLUCOSE-CAPILLARY: 382 mg/dL — AB (ref 65–99)
GLUCOSE-CAPILLARY: 465 mg/dL — AB (ref 65–99)
GLUCOSE-CAPILLARY: 423 mg/dL — AB (ref 65–99)
GLUCOSE-CAPILLARY: 207 mg/dL — AB (ref 65–99)
Glucose-Capillary: 489 mg/dL — ABNORMAL HIGH (ref 65–99)
Glucose-Capillary: 431 mg/dL — ABNORMAL HIGH (ref 65–99)

## 2016-03-29 LAB — HEMOGLOBIN A1C
HEMOGLOBIN A1C: 5.9 % — AB (ref 4.8–5.6)
MEAN PLASMA GLUCOSE: 123 mg/dL

## 2016-03-29 LAB — PROTIME-INR
INR: 3.85
Prothrombin Time: 38.8 seconds — ABNORMAL HIGH (ref 11.4–15.2)

## 2016-03-29 LAB — BASIC METABOLIC PANEL
ANION GAP: 8 (ref 5–15)
BUN: 32 mg/dL — ABNORMAL HIGH (ref 6–20)
CALCIUM: 8.5 mg/dL — AB (ref 8.9–10.3)
CHLORIDE: 98 mmol/L — AB (ref 101–111)
CO2: 29 mmol/L (ref 22–32)
Creatinine, Ser: 0.9 mg/dL (ref 0.61–1.24)
GFR calc non Af Amer: >60 mL/min (ref >60)
GLUCOSE: 356 mg/dL — AB (ref 65–99)
POTASSIUM: 5.4 mmol/L — AB (ref 3.5–5.1)
Sodium: 135 mmol/L (ref 135–145)

## 2016-03-29 LAB — GLUCOSE, RANDOM
GLUCOSE: 446 mg/dL — AB (ref 65–99)
Glucose, Bld: 493 mg/dL — ABNORMAL HIGH (ref 65–99)

## 2016-03-29 LAB — TROPONIN I: Troponin I: 0.03 ng/mL (ref <0.03)

## 2016-03-29 LAB — URINE CULTURE: CULTURE: NO GROWTH

## 2016-03-29 LAB — ECHOCARDIOGRAM COMPLETE
Height: 72 in
Weight: 5001.8 oz

## 2016-03-29 LAB — HIV ANTIBODY (ROUTINE TESTING W REFLEX): HIV SCREEN 4TH GENERATION: NONREACTIVE

## 2016-03-29 MED ORDER — IPRATROPIUM-ALBUTEROL 0.5-2.5 (3) MG/3ML IN SOLN
3.0000 mL | Freq: Four times a day (QID) | RESPIRATORY_TRACT | Status: DC | PRN
  Administered 2016-04-01: 3 mL via RESPIRATORY_TRACT

## 2016-03-29 MED ORDER — INSULIN ASPART 100 UNIT/ML ~~LOC~~ SOLN
0.0000 [IU] | Freq: Every day | SUBCUTANEOUS | Status: DC
  Administered 2016-03-30: 4 [IU] via SUBCUTANEOUS
  Administered 2016-03-31: 10 [IU] via SUBCUTANEOUS
  Administered 2016-04-02: 8 [IU] via SUBCUTANEOUS
  Administered 2016-04-03: 6 [IU] via SUBCUTANEOUS

## 2016-03-29 MED ORDER — ORAL CARE MOUTH RINSE
15.0000 mL | Freq: Two times a day (BID) | OROMUCOSAL | Status: DC
  Administered 2016-03-29 – 2016-04-04 (×10): 15 mL via OROMUCOSAL

## 2016-03-29 MED ORDER — INSULIN GLARGINE 100 UNIT/ML ~~LOC~~ SOLN
35.0000 [IU] | Freq: Every day | SUBCUTANEOUS | Status: DC
  Administered 2016-03-29: 35 [IU] via SUBCUTANEOUS
  Filled 2016-03-29 (×2): qty 0.35

## 2016-03-29 MED ORDER — INSULIN ASPART 100 UNIT/ML ~~LOC~~ SOLN
25.0000 [IU] | Freq: Once | SUBCUTANEOUS | Status: AC
  Administered 2016-03-29: 25 [IU] via SUBCUTANEOUS

## 2016-03-29 MED ORDER — IPRATROPIUM-ALBUTEROL 0.5-2.5 (3) MG/3ML IN SOLN
3.0000 mL | Freq: Three times a day (TID) | RESPIRATORY_TRACT | Status: DC
  Administered 2016-03-30 – 2016-04-04 (×14): 3 mL via RESPIRATORY_TRACT
  Filled 2016-03-29 (×16): qty 3

## 2016-03-29 MED ORDER — INSULIN GLARGINE 100 UNIT/ML ~~LOC~~ SOLN
15.0000 [IU] | Freq: Once | SUBCUTANEOUS | Status: AC
  Administered 2016-03-29: 15 [IU] via SUBCUTANEOUS
  Filled 2016-03-29: qty 0.15

## 2016-03-29 MED ORDER — PERFLUTREN LIPID MICROSPHERE
1.0000 mL | INTRAVENOUS | Status: AC | PRN
Start: 2016-03-29 — End: 2016-03-29
  Administered 2016-03-29: 1 mL via INTRAVENOUS
  Administered 2016-03-29: 2 mL via INTRAVENOUS

## 2016-03-29 MED ORDER — INSULIN ASPART 100 UNIT/ML ~~LOC~~ SOLN
20.0000 [IU] | Freq: Once | SUBCUTANEOUS | Status: AC
  Administered 2016-03-29: 20 [IU] via SUBCUTANEOUS

## 2016-03-29 MED ORDER — INSULIN GLARGINE 100 UNIT/ML ~~LOC~~ SOLN
45.0000 [IU] | Freq: Every day | SUBCUTANEOUS | Status: DC
  Administered 2016-03-30 – 2016-04-04 (×6): 45 [IU] via SUBCUTANEOUS
  Filled 2016-03-29 (×7): qty 0.45

## 2016-03-29 MED ORDER — INSULIN ASPART 100 UNIT/ML ~~LOC~~ SOLN
0.0000 [IU] | Freq: Three times a day (TID) | SUBCUTANEOUS | Status: DC
  Administered 2016-03-30 (×2): 20 [IU] via SUBCUTANEOUS
  Administered 2016-03-30: 7 [IU] via SUBCUTANEOUS
  Administered 2016-03-31: 20 [IU] via SUBCUTANEOUS
  Administered 2016-03-31: 7 [IU] via SUBCUTANEOUS
  Administered 2016-03-31: 11 [IU] via SUBCUTANEOUS
  Administered 2016-04-01: 4 [IU] via SUBCUTANEOUS
  Administered 2016-04-01: 7 [IU] via SUBCUTANEOUS
  Administered 2016-04-02: 20 [IU] via SUBCUTANEOUS
  Administered 2016-04-02 – 2016-04-03 (×2): 7 [IU] via SUBCUTANEOUS
  Administered 2016-04-03: 11 [IU] via SUBCUTANEOUS
  Administered 2016-04-03 – 2016-04-04 (×2): 7 [IU] via SUBCUTANEOUS

## 2016-03-29 NOTE — Progress Notes (Signed)
PROGRESS NOTE    Justin Ruiz  [id number] DOB: 1950-12-21 DOA: 03/28/2016 PCP: Redge Gainer, MD    Brief Narrative:  65 y/o male with multiple medical problems who was brought to ED when he was found unresponsive and hypoglycemic. He has had cough and cold symptoms for the past week. He was found to have respiratory failure due to pneumonia/CHF. He was started on bipap for hypercapnea and has since improved. Blood sugars have also improved by holding insulin. He is being diuresed for CHF and on abx for pneumonia. Anticipate a few more days in the hospital.   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 Acute on chronic respiratory failure. Likely related to congestive heart failure as well as pneumonia. The patient has underlying COPD. Initially required bipap but ABG improved. Now currently on nasal cannula  2. Acute on chronic congestive heart failure. Previous echo from 2011 indicated a possible diastolic failure. This will be repeated. He is already on beta blocker. ACEi held due to hyperkalemia. Started on IV Lasix. Urine output 2.4L yesterday. Monitor intake and output.  3. Atrial fibrillation with rapid ventricular response. Currently on Cardizem infusion. Heart rate is better controlled. He is also on metoprolol. Will discontinue cardizem infusion and monitor heart rate. He is anticoagulated with Coumadin.  4. Community acquired pneumonia. He has been coughing for the past 2 weeks and chest x-ray shows possible  pneumonia. He's been started on intravenous antibiotics. Follow-up sputum cultures. MRSA PCR negative, so will discontinue vancomycin. Continue levaquin.  5. Metabolic encephalopathy. Likely related to hypoglycemia as well as elevated PCO2. It is unknown how long the patient was hypoglycemia before when he was found. Since admission, hypoglycemia has improved. CT head was negative. Continue to monitor.  6. Diabetes. Patient is insulin-dependent. Wife reports that his dose has not changed recently but his by mouth intake has declined due to his acute illness. Will check hemoglobin A1c. Since blood sugars are now elevated, will start on lantus.  7. COPD. Continue on bronchodilators and antibiotics. Continue on IV steroids.  8. Hypertension. Continue home medications. Blood pressure is currently stable.  9. Diffuse large B-cell lymphoma. Follow-up with oncology.   DVT prophylaxis: coumadin Code Status: full code Family Communication: no family present today Disposition Plan: discharge home once improved   Consultants:     Procedures:   Echo pending  Antimicrobials:   levaquin 12/4>>  Vancomycin 12/4>>12/5   Subjective: Confused. He is coughing. Shortness of breath improving  Objective: Vitals:   03/29/16 0757 03/29/16 0800 03/29/16 0815 03/29/16 0830  BP:  109/64 (!) 104/57 112/60  Pulse:  82 73 83  Resp:  (!) 30 18 (!) 24  Temp:      TempSrc:  Oral    SpO2: 92% 91% 90% 92%  Weight:      Height:        Intake/Output Summary (Last 24 hours) at 03/29/16 0925 Last data filed at 03/29/16 [id number]  Gross per 24 hour  Intake  845.96 ml  Output             2410 ml  Net         -1564.04 ml   Filed Weights   03/28/16 1054 03/29/16 0500  Weight: 136.1 kg (300 lb) (!) 141.8 kg (312 lb 9.8 oz)    Examination:  General exam: Appears calm and comfortable  Respiratory system: Clear to auscultation. Respiratory effort normal. Cardiovascular system: S1 & S2  heard, irregular. No JVD, murmurs, rubs, gallops or clicks. 1-2+ pedal edema. Gastrointestinal system: Abdomen is nondistended, soft and nontender. No organomegaly or masses felt. Normal bowel sounds heard. Central nervous system: oriented to place. No focal neurological deficits. Extremities: Symmetric 5 x 5 power. Skin: No rashes, lesions or ulcers Psychiatry: confused, pleasant    Data Reviewed: I have personally reviewed following labs and imaging studies  CBC:  Recent Labs Lab 03/28/16 1101 03/28/16 1106  WBC 8.6  --   NEUTROABS 6.7  --   HGB 14.2 15.3  HCT 44.9 45.0  MCV 97.8  --   PLT 287  --    Basic Metabolic Panel:  Recent Labs Lab 03/28/16 1101 03/28/16 1106 03/29/16 0453  NA 138 139 135  K 4.9 4.9 5.4*  CL 99* 100* 98*  CO2 33*  --  29  GLUCOSE 182* 171* 356*  BUN 30* 32* 32*  CREATININE 0.84 0.90 0.90  CALCIUM 8.8*  --  8.5*   GFR: Estimated Creatinine Clearance: 119.6 mL/min (by C-G formula based on SCr of 0.9 mg/dL). Liver Function Tests:  Recent Labs Lab 03/28/16 1101  AST 23  ALT 21  ALKPHOS 98  BILITOT 0.6  PROT 7.1  ALBUMIN 2.9*   No results for input(s): LIPASE, AMYLASE in the last 168 hours. No results for input(s): AMMONIA in the last 168 hours. Coagulation Profile:  Recent Labs Lab 03/28/16 1101 03/29/16 0453  INR 2.63 3.85   Cardiac Enzymes:  Recent Labs Lab 03/28/16 1813 03/28/16 2332 03/29/16 0453  TROPONINI 0.03* <0.03 0.03*   BNP (last 3 results) No results for input(s): PROBNP in the last 8760 hours. HbA1C:  Recent Labs  03/28/16 1101  HGBA1C 5.9*   CBG:  Recent Labs Lab 03/28/16 1802 03/28/16 1934 03/28/16 2344 03/29/16 0416 03/29/16 0711  GLUCAP 176* 214* 361* 388* 382*   Lipid Profile: No results for input(s): CHOL, HDL, LDLCALC, TRIG, CHOLHDL, LDLDIRECT in the last 72 hours. Thyroid Function Tests:  Recent Labs  03/28/16 1813  TSH 0.325*   Anemia Panel: No results for input(s):  VITAMINB12, FOLATE, FERRITIN, TIBC, IRON, RETICCTPCT in the last 72 hours. Sepsis Labs:  Recent Labs Lab 03/28/16 1131 03/28/16 1452  LATICACIDVEN 0.87 1.4    Recent Results (from the past 240 hour(s))  Blood Culture (routine x 2)     Status: None (Preliminary result)   Collection Time: 03/28/16 11:01 AM  Result Value Ref Range Status   Specimen Description BLOOD DRAWN BY IV THERAPY  Final   Special Requests BOTTLES DRAWN AEROBIC AND ANAEROBIC 6 CC EACH  Final   Culture PENDING  Incomplete   Report Status PENDING  Incomplete  Blood Culture (routine x 2)     Status: None (Preliminary result)   Collection Time: 03/28/16 11:17 AM  Result Value Ref Range Status   Specimen Description BLOOD RIGHT WRIST  Final   Special Requests BOTTLES DRAWN AEROBIC AND ANAEROBIC 5 CC EACH  Final   Culture PENDING  Incomplete   Report Status PENDING  Incomplete  MRSA PCR Screening     Status: None   Collection Time: 03/28/16  4:12 PM  Result Value Ref Range Status   MRSA by PCR NEGATIVE NEGATIVE Final    Comment:        The GeneXpert MRSA Assay (FDA approved for NASAL specimens only), is one component of a comprehensive MRSA colonization surveillance program. It is not intended to diagnose MRSA infection nor to guide or monitor treatment for MRSA infections.          Radiology Studies: Ct Head Wo Contrast  Result Date: 03/28/2016 CLINICAL DATA:  Hypoglycemia.  COPD.  Altered mental status. EXAM: CT HEAD WITHOUT CONTRAST TECHNIQUE: Contiguous axial images were obtained from the base of the skull through the vertex without intravenous contrast. COMPARISON:  None. FINDINGS: Brain: No evidence of acute infarction, hemorrhage, extra-axial collection, ventriculomegaly, or mass effect. Generalized cerebral atrophy. Periventricular white matter low attenuation likely secondary to microangiopathy. Vascular: Cerebrovascular atherosclerotic calcifications are noted. Skull: Negative for fracture or  focal lesion. Sinuses/Orbits: Visualized portions of the orbits are unremarkable. Visualized portions of the paranasal sinuses and mastoid air cells are unremarkable. Other: None. IMPRESSION: 1. No acute intracranial pathology. 2. Chronic microvascular disease and cerebral atrophy. Electronically Signed   By: Kathreen Devoid   On: 03/28/2016 12:31   Dg Chest Portable 1 View  Result Date: 03/28/2016 CLINICAL DATA:  Shortness of breath. Patient's history of COPD, former smoker, hypertension, diabetes. EXAM: PORTABLE CHEST 1 VIEW COMPARISON:  Portable chest x-ray of June 07, 2015 FINDINGS: The lungs are well-expanded. The interstitial markings are increased bilaterally. There is patchy density at the left lung base partially obscuring the heart border. The cardiac silhouette is enlarged. The pulmonary vascularity is engorged. The interstitial markings are increased. IMPRESSION: COPD. CHF with pulmonary interstitial edema bilaterally. Patchy atelectasis or infiltrate in the lingula. The appearance of the chest has deteriorated since the February 2017 study. Electronically Signed   By: David  Martinique M.D.   On: 03/28/2016 11:35        Scheduled Meds: . furosemide  40 mg Intravenous Q12H  . insulin aspart  0-9 Units Subcutaneous Q4H  . ipratropium-albuterol  3 mL Nebulization Q6H  . levofloxacin (LEVAQUIN) IV  750 mg Intravenous Q24H  . methylPREDNISolone (SOLU-MEDROL) injection  60 mg Intravenous Q12H  . metoprolol  100 mg Oral BID  . simvastatin  40 mg Oral q1800  . sodium chloride flush  3 mL Intravenous Q12H  . vancomycin  1,000 mg Intravenous Q8H  . Warfarin - Pharmacist Dosing Inpatient   Does not apply q1800   Continuous Infusions: . diltiazem (CARDIZEM) infusion 5 mg/hr (03/29/16 0050)     LOS: 1 day    Time spent: 41mins    Paitlyn Mcclatchey, MD [facility] Pager [phone]  If 7PM-7AM, please contact night-coverage [email or web address] Password [email or web address] 03/29/2016, 9:25 AM

## 2016-03-29 NOTE — Progress Notes (Signed)
*  PRELIMINARY RESULTS* Echocardiogram 2D Echocardiogram has been performed with Definity.  Justin Ruiz 03/29/2016, 4:17 PM

## 2016-03-29 NOTE — Progress Notes (Signed)
Inpatient Diabetes Program Recommendations  AACE/ADA: New Consensus Statement on Inpatient Glycemic Control (2015)  Target Ranges:  Prepandial:   less than 140 mg/dL      Peak postprandial:   less than 180 mg/dL (1-2 hours)      Critically ill patients:  140 - 180 mg/dL   Results for Justin Ruiz, Justin Ruiz (MRN [id number]) as of 03/29/2016 09:20  Ref. Range 03/28/2016 11:03 03/28/2016 13:07 03/28/2016 18:02 03/28/2016 19:34 03/28/2016 23:44 03/29/2016 04:16 03/29/2016 07:11  Glucose-Capillary Latest Ref Range: 65 - 99 mg/dL 127 (H) 120 (H) 176 (H) 214 (H) 361 (H) 388 (H) 382 (H)     Review of Glycemic Control  Diabetes history: DM2 Outpatient Diabetes medications: NPH 35 units QAM, Metformin 500 mg QAM, Metformin 1000 mg QPM, Regular 32 units BID Current orders for Inpatient glycemic control: Novolog 0-9 units Q4H  Inpatient Diabetes Program Recommendations: Insulin - Basal: Please consider ordering Lantus 35 units Q24H (based on 141 kg x 0.25 units).  Thanks, Barnie Alderman, RN, MSN, CDE Diabetes Coordinator Inpatient Diabetes Program [phone] (Team Pager from 8am to 5pm)

## 2016-03-29 NOTE — Progress Notes (Signed)
ANTICOAGULATION   Pharmacy Consult for Coumadin (chronic Rx PTA)  Indication: Afib  Allergies  Allergen Reactions  . Avapro [Irbesartan] Other (See Comments)    "doesnt sit right with me"--light headed  . Lipitor [Atorvastatin Calcium] Other (See Comments)    Leg pain  . Penicillins     Unknown reaction   Patient Measurements: Height: 6' (182.9 cm) Weight: (!) 312 lb 9.8 oz (141.8 kg) IBW/kg (Calculated) : 77.6  Vital Signs: Temp: 97.6 F (36.4 C) (12/05 0713) Temp Source: Oral (12/05 0800) BP: 112/60 (12/05 0830) Pulse Rate: 83 (12/05 0830)  Labs:  Recent Labs  03/28/16 1101 03/28/16 1106 03/28/16 1813 03/28/16 2332 03/29/16 0453  HGB 14.2 15.3  --   --   --   HCT 44.9 45.0  --   --   --   PLT 287  --   --   --   --   LABPROT 28.6*  --   --   --  38.8*  INR 2.63  --   --   --  3.85  CREATININE 0.84 0.90  --   --  0.90  TROPONINI  --   --  0.03* <0.03 0.03*   Estimated Creatinine Clearance: 119.6 mL/min (by C-G formula based on SCr of 0.9 mg/dL).  Medical History: Past Medical History:  Diagnosis Date  . Arthritis   . Asthma   . BPH (benign prostatic hypertrophy)   . Colon polyps   . COPD (chronic obstructive pulmonary disease) ([place])   . Diabetes mellitus   . GERD (gastroesophageal reflux disease)   . History of oxygen administration    occ. oxygen use at 2 l/m as needed- not using at this time  . HTN (hypertension)    x 3 years  . Hyperlipidemia   . Lung nodules 03/11/2013  . Mediastinal adenopathy 06/20/2013   CT  & PET 2/15   . 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 NHL (non-Hodgkin's lymphoma) ([place])    nhl dx 9/11  . Obesity    exogenous  . Shingles   . Sleep apnea    recent study 04-07-14 awaiting results, no cpap yet.-Dr. Glynn Octave   Medications:  Prescriptions Prior to Admission  Medication Sig Dispense Refill Last Dose  . acetaminophen (TYLENOL) 500 MG tablet Take 1,000 mg by mouth every 6 (six) hours as  needed for headache. Reported on 10/08/2015   unknown  . ALPRAZolam (XANAX) 0.25 MG tablet TAKE ONE TABLET BY MOUTH AT BEDTIME 30 tablet 2 unknown  . Cholecalciferol (VITAMIN D3) 5000 UNITS TABS Take 1 tablet by mouth every morning.   unknown  . enalapril (VASOTEC) 20 MG tablet TAKE ONE TABLET BY MOUTH ONCE DAILY 30 tablet 5 unknown at Unknown time  . guaiFENesin (MUCINEX) 600 MG 12 hr tablet Take 600 mg by mouth 2 (two) times daily as needed for cough or to loosen phlegm.   Past Week at Unknown time  . insulin NPH Human (HUMULIN N,NOVOLIN N) 100 UNIT/ML injection Inject 35 Units into the skin daily before breakfast.    unknown  . ipratropium-albuterol (DUONEB) 0.5-2.5 (3) MG/3ML SOLN PLACE 1 VIAL (3 MLS) IN THE NEBULIZER EVERY 6 HOURS AS NEEDED 1080 mL 0 Past Week at Unknown time  . metFORMIN (GLUCOPHAGE) 500 MG tablet TAKE ONE TABLET BY MOUTH IN THE MORNING AND TAKE  TWO TABLETS IN THE EVENING 270 tablet 0 unknown  . metoprolol (LOPRESSOR) 100 MG tablet TAKE ONE TABLET BY MOUTH TWICE DAILY  180 tablet 1 unknown  . NOVOLIN R RELION 100 UNIT/ML injection INJECT 32 UNITS SUBCUTANEOUSLY TWICE A DAY BEFORE A MEAL 20 mL 2 unknown  . simvastatin (ZOCOR) 40 MG tablet TAKE ONE TABLET BY MOUTH ONCE DAILY 90 tablet 0 unknown  . warfarin (COUMADIN) 5 MG tablet TAKE ONE & ONE-HALF TABLETS BY MOUTH ONCE DAILY EVERY MONDAY AND EVERY FRIDAY THEN TAKE ONE  TABLET THE REST OF THE WEEK 140 tablet 2 unknown  . albuterol (PROAIR HFA) 108 (90 BASE) MCG/ACT inhaler Inhale 2 puffs into the lungs every 6 (six) hours as needed for wheezing or shortness of breath. 8.5 g 5 Taking  . Blood Glucose Calibration (TAI DOC CONTROL) NORMAL SOLN Use as directed.   Taking  . Blood Glucose Monitoring Suppl (CLEVER CHEK AUTO-CODE VOICE) DEVI USE AS DIRECTED 1 each 2 Taking  . Lancet Devices (LANCING DEVICE) MISC USE AS DIRECTED 1 each 2 Taking   Assessment: 65yo male presented to ED unresponsive and with low blood sugar.  Asked to  resume home Warfarin and initiate Vancomycin for pna.  Pt is morbidly obese with good renal fxn.  INR 3.85, elevated level.   Goal of Therapy:  INR 2-3 Monitor platelets by anticoagulation protocol: Yes  Plan:  No Coumadin today PT/INR daily Monitor for S/S of bleeding  Isac Sarna, BS Vena [place], BCPS Clinical Pharmacist Pager [phone] 03/29/2016,10:46 AM

## 2016-03-29 NOTE — Care Management Note (Signed)
Case Management Note  Patient Details  Name: Justin Ruiz MRN: [id number] Date of Birth: [birth year]  Subjective/Objective:                  Pt is from home, lives with wife and is ind with ADL's. He has PCP, Dr. Laurance Flatten. Pt uses pharmacy in [place], He has neb machine and supplemental oxygen at home to use if needed. He has CPAP but is non-compliant He has a scale at home PTA but does not weigh himself daily.   Action/Plan: Pt plans to return home at DC.  Pt may need PT eval.  Pt would benefit from [facility] nursing for disease management, Will discuss with pt further during hospitalization. Will cont to follow.   Expected Discharge Date:     04/03/2016             Expected Discharge Plan:  Home/Self Care  In-House Referral:  NA  Discharge planning Services  CM Consult  Post Acute Care Choice:  NA Choice offered to:  NA  Status of Service:  In process, will continue to follow  Sherald Barge, RN 03/29/2016, 3:03 PM

## 2016-03-30 ENCOUNTER — Encounter: Payer: Self-pay | Admitting: Pharmacist

## 2016-03-30 DIAGNOSIS — G9340 Encephalopathy, unspecified: Secondary | ICD-10-CM

## 2016-03-30 DIAGNOSIS — I5031 Acute diastolic (congestive) heart failure: Secondary | ICD-10-CM

## 2016-03-30 DIAGNOSIS — J189 Pneumonia, unspecified organism: Secondary | ICD-10-CM

## 2016-03-30 DIAGNOSIS — J9622 Acute and chronic respiratory failure with hypercapnia: Secondary | ICD-10-CM

## 2016-03-30 LAB — GLUCOSE, CAPILLARY
GLUCOSE-CAPILLARY: 140 mg/dL — AB (ref 65–99)
GLUCOSE-CAPILLARY: 202 mg/dL — AB (ref 65–99)
GLUCOSE-CAPILLARY: 467 mg/dL — AB (ref 65–99)
Glucose-Capillary: 147 mg/dL — ABNORMAL HIGH (ref 65–99)
Glucose-Capillary: 160 mg/dL — ABNORMAL HIGH (ref 65–99)
Glucose-Capillary: 434 mg/dL — ABNORMAL HIGH (ref 65–99)

## 2016-03-30 LAB — BASIC METABOLIC PANEL
Anion gap: 7 (ref 5–15)
BUN: 37 mg/dL — AB (ref 6–20)
CALCIUM: 8.9 mg/dL (ref 8.9–10.3)
CO2: 34 mmol/L — ABNORMAL HIGH (ref 22–32)
CREATININE: 0.88 mg/dL (ref 0.61–1.24)
Chloride: 96 mmol/L — ABNORMAL LOW (ref 101–111)
GFR calc Af Amer: >60 mL/min (ref >60)
Glucose, Bld: 170 mg/dL — ABNORMAL HIGH (ref 65–99)
POTASSIUM: 4.8 mmol/L (ref 3.5–5.1)
SODIUM: 137 mmol/L (ref 135–145)

## 2016-03-30 LAB — PROTIME-INR
INR: 4.19
PROTHROMBIN TIME: 41.3 s — AB (ref 11.4–15.2)

## 2016-03-30 LAB — CBC
HCT: 42 % (ref 39.0–52.0)
Hemoglobin: 13.5 g/dL (ref 13.0–17.0)
MCH: 30.5 pg (ref 26.0–34.0)
MCHC: 32.1 g/dL (ref 30.0–36.0)
MCV: 94.8 fL (ref 78.0–100.0)
PLATELETS: 277 10*3/uL (ref 150–400)
RBC: 4.43 MIL/uL (ref 4.22–5.81)
RDW: 13 % (ref 11.5–15.5)
WBC: 14.9 10*3/uL — ABNORMAL HIGH (ref 4.0–10.5)

## 2016-03-30 LAB — STREP PNEUMONIAE URINARY ANTIGEN: Strep Pneumo Urinary Antigen: NEGATIVE

## 2016-03-30 MED ORDER — INSULIN ASPART 100 UNIT/ML ~~LOC~~ SOLN
10.0000 [IU] | Freq: Three times a day (TID) | SUBCUTANEOUS | Status: DC
  Administered 2016-03-30 – 2016-04-04 (×16): 10 [IU] via SUBCUTANEOUS

## 2016-03-30 MED ORDER — PREDNISONE 20 MG PO TABS
50.0000 mg | ORAL_TABLET | Freq: Every day | ORAL | Status: DC
  Administered 2016-03-31 – 2016-04-02 (×3): 50 mg via ORAL
  Filled 2016-03-30 (×3): qty 2

## 2016-03-30 NOTE — Progress Notes (Signed)
Inpatient Diabetes Program Recommendations  AACE/ADA: New Consensus Statement on Inpatient Glycemic Control (2015)  Target Ranges:  Prepandial:   less than 140 mg/dL      Peak postprandial:   less than 180 mg/dL (1-2 hours)      Critically ill patients:  140 - 180 mg/dL    Review of Glycemic Control  Diabetes history: DM2 Outpatient Diabetes medications: NPH 35 units QAM, Metformin 500 mg QAM, Metformin 1000 mg QPM, Regular 32 units BID Current orders for Inpatient glycemic control: Lantus 45 units daily, Novolog 0-20 units TID with meals, Novolog 0-12 units QHS  Inpatient Diabetes Program Recommendations: Insulin - Basal: Note Lantus was increased to 45 units daily.  Insulin - Meal Coverage: Please consider ordering Novolog 10 units TID with meals for meal coverage if patient eats at least 50% of meals.  Thanks, Barnie Alderman, RN, MSN, CDE Diabetes Coordinator Inpatient Diabetes Program [phone] (Team Pager from 8am to 5pm)

## 2016-03-30 NOTE — Progress Notes (Signed)
PROGRESS NOTE    Justin Ruiz  [id number] DOB: 28-Sep-1950 DOA: 03/28/2016 PCP: Redge Gainer, MD   Brief Narrative: 65 y/o male with multiple medical problems who was brought to ED when he was found unresponsive and hypoglycemic. He has had cough and cold symptoms for the past week. He was found to have respiratory failure due to pneumonia/CHF. He was started on bipap for hypercapnea and has since improved. Blood sugars have also improved by holding insulin. He is being diuresed for CHF and on abx for pneumonia.   Assessment & Plan:   # Acute on chronic hypoxic/hypercapnic respiratory failure. Likely related to congestive heart failure and pneumonia. The patient has underlying COPD.  -Initially required bipap but ABG improved.  -Currently on 4 L of oxygen via nasal cannula. He uses 2 L of altered home. Try to wean gradually.  # Acute on chronic congestive heart failure, likely diastolic. Repeat echocardiogram with left ventricular ejection fraction of 50%. Indeterminate diastolic function due to the afib.  -Currently on metoprolol, IV Lasix 40 twice a day. Patient has about 4.7 L negative. Repeat chest x-ray in the morning. - Monitor intake and output.  # Atrial fibrillation with rapid ventricular response. s/p Cardizem drip. Currently on metoprolol 100 mg twice a day. Heart rate is better controlled. He is on Coumadin for systemic anticoagulation. Monitor INR.  -Patient has supratherapeutic INR today. Pharmacist dosing Coumadin. Hold today.  # Community acquired pneumonia. Presented with shortness of breath and x-ray consistent with possible pneumonia. Continue Levaquin. Cultures negative. Off vancomycin.  # Acute metabolic encephalopathy. Likely related to hypoglycemia as well as elevated PCO2 on admission. CT head was negative. Continue to monitor. Mental status improving.  # Uncontrolled insulin-dependent diabetes with hyperglycemia: Patient has uncontrolled hyperglycemia in  the hospital likely exacerbated by steroid. Added Humalog 10 units before meals. Continue Lantus 45 units tonight. Continue sliding scale. May need to lower her insulin days since we are tapering the steroid. Continue to monitor blood sugar level closely. Discussed with the patient's nurse. -a1c 5.9 only. He was admitted with hypoglycemia on admission.  # COPD. Stable. Continue on bronchodilators, antibiotics, solumedrol switched to prednisone.  # Hypertension. Blood pressure stable. Continue Lasix and metoprolol.  # Diffuse large B-cell lymphoma. Follow-up with oncology.   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 He was alert awake and answering questions. Has cough and mild shortness of breath. He reported feeling better. Denied chest pain, dizziness, nausea or vomiting.   Objective: Vitals:   03/30/16 0807 03/30/16 0900 03/30/16 1000 03/30/16 1200  BP:  136/75 127/70   Pulse:  (!) 103    Resp:  (!) 25 17   Temp: 98.1 F (36.7 C)   98.1 F (36.7 C)  TempSrc: Oral   Oral  SpO2:  92% 91%   Weight:      Height:        Intake/Output Summary (Last 24 hours) at 03/30/16 1348 Last data filed at 03/30/16 1310  Gross per 24 hour  Intake               870 ml  Output             3800 ml  Net            -2930 ml   Filed Weights   03/28/16 1054 03/29/16 0500 03/30/16 0500  Weight: 136.1 kg (300 lb) (!) 141.8 kg (312 lb 9.8 oz) (!) 140.5 kg (309 lb 11.9 oz)    Examination:  General exam: Appears calm and comfortable  Respiratory system: Bibasal decreased breath sound, Respiratory effort normal.  Cardiovascular system: S1 & S2 heard, irregular.  No pedal edema. Gastrointestinal system: Abdomen is nondistended, soft and nontender. Normal bowel sounds heard. Central nervous system: Alert awake and following commands Extremities: Symmetric 5 x 5 power. Skin: No rashes, lesions or ulcers Psychiatry: Judgement and insight appear impaired.     Data Reviewed: I have personally reviewed following labs and imaging studies  CBC:  Recent Labs Lab 03/28/16 1101 03/28/16 1106 03/30/16 0402  WBC 8.6  --  14.9*  NEUTROABS 6.7  --   --   HGB 14.2 15.3 13.5  HCT 44.9 45.0 42.0  MCV 97.8  --  94.8  PLT 287  --  99991111   Basic Metabolic Panel:  Recent Labs Lab 03/28/16 1101 03/28/16 1106 03/29/16 0453 03/29/16 1146 03/29/16 1651 03/30/16 0402  NA 138 139 135  --   --  137  K 4.9 4.9 5.4*  --   --  4.8  CL 99* 100* 98*  --   --  96*  CO2 33*  --  29  --   --  34*  GLUCOSE 182* 171* 356* 493* 446* 170*  BUN 30* 32* 32*  --   --  37*  CREATININE 0.84 0.90 0.90  --   --  0.88  CALCIUM 8.8*  --  8.5*  --   --  8.9   GFR: Estimated Creatinine Clearance: 121.7 mL/min (by C-G formula based on SCr of 0.88 mg/dL). Liver Function Tests:  Recent Labs Lab 03/28/16 1101  AST 23  ALT 21  ALKPHOS 98  BILITOT 0.6  PROT 7.1  ALBUMIN 2.9*   No results for input(s): LIPASE, AMYLASE in the last 168 hours. No results for input(s): AMMONIA in the last 168 hours. Coagulation Profile:  Recent Labs Lab 03/28/16 1101 03/29/16 0453 03/30/16 0402  INR 2.63 3.85 4.19*   Cardiac Enzymes:  Recent Labs Lab 03/28/16 1813  03/28/16 2332 03/29/16 0453  TROPONINI 0.03* <0.03 0.03*   BNP (last 3 results) No results for input(s): PROBNP in the last 8760 hours. HbA1C:  Recent Labs  03/28/16 1101  HGBA1C 5.9*   CBG:  Recent Labs Lab 03/30/16 0037 03/30/16 0249 03/30/16 0411 03/30/16 0733 03/30/16 1130  GLUCAP 140* 147* 160* 202* 467*   Lipid Profile: No results for input(s): CHOL, HDL, LDLCALC, TRIG, CHOLHDL, LDLDIRECT in the last 72 hours. Thyroid Function Tests:  Recent Labs  03/28/16 1813  TSH 0.325*   Anemia Panel: No results for input(s): VITAMINB12, FOLATE, FERRITIN, TIBC, IRON, RETICCTPCT in the last 72 hours. Sepsis Labs:  Recent Labs Lab 03/28/16 1131 03/28/16 1452  LATICACIDVEN 0.87 1.4    Recent Results (from the past 240  hour(s))  Blood Culture (routine x 2)     Status: None (Preliminary result)   Collection Time: 03/28/16 11:01 AM  Result Value Ref Range Status   Specimen Description BLOOD DRAWN BY IV THERAPY  Final   Special Requests BOTTLES DRAWN AEROBIC AND ANAEROBIC 6 CC EACH  Final   Culture NO GROWTH 2 DAYS  Final   Report Status PENDING  Incomplete  Blood Culture (routine x 2)     Status: None (Preliminary result)   Collection Time: 03/28/16 11:17 AM  Result Value Ref Range Status   Specimen Description BLOOD RIGHT WRIST  Final   Special Requests BOTTLES DRAWN AEROBIC AND ANAEROBIC 5 CC EACH  Final   Culture NO GROWTH 2 DAYS  Final   Report Status PENDING  Incomplete  Urine culture     Status: None   Collection Time: 03/28/16 11:29 AM  Result Value Ref Range Status   Specimen Description URINE, CATHETERIZED  Final   Special Requests NONE  Final   Culture NO GROWTH Performed at [facility]   Final   Report Status 03/29/2016 FINAL  Final  MRSA PCR Screening     Status: None   Collection Time: 03/28/16  4:12 PM  Result Value Ref Range Status   MRSA by PCR NEGATIVE NEGATIVE Final    Comment:        The GeneXpert MRSA Assay (FDA approved for  NASAL specimens only), is one component of a comprehensive MRSA colonization surveillance program. It is not intended to diagnose MRSA infection nor to guide or monitor treatment for MRSA infections.          Radiology Studies: No results found.      Scheduled Meds: . furosemide  40 mg Intravenous Q12H  . insulin aspart  0-12 Units Subcutaneous QHS  . insulin aspart  0-20 Units Subcutaneous TID WC  . insulin aspart  10 Units Subcutaneous TID WC  . insulin glargine  45 Units Subcutaneous Daily  . ipratropium-albuterol  3 mL Nebulization TID  . levofloxacin (LEVAQUIN) IV  750 mg Intravenous Q24H  . mouth rinse  15 mL Mouth Rinse BID  . metoprolol  100 mg Oral BID  . [START ON 03/31/2016] predniSONE  50 mg Oral Q breakfast  . simvastatin  40 mg Oral q1800  . sodium chloride flush  3 mL Intravenous Q12H  . Warfarin - Pharmacist Dosing Inpatient   Does not apply q1800   Continuous Infusions:   LOS: 2 days    Dron Tanna Furry, MD [facility] Pager [phone]  If 7PM-7AM, please contact night-coverage [email or web address] Password [email or web address] 03/30/2016, 1:48 PM

## 2016-03-30 NOTE — Progress Notes (Signed)
ANTICOAGULATION   Pharmacy Consult for Coumadin (chronic Rx PTA)  Indication: Afib  Allergies  Allergen Reactions  . Avapro [Irbesartan] Other (See Comments)    "doesnt sit right with me"--light headed  . Lipitor [Atorvastatin Calcium] Other (See Comments)    Leg pain  . Penicillins     Unknown reaction   Patient Measurements: Height: 6' (182.9 cm) Weight: (!) 309 lb 11.9 oz (140.5 kg) IBW/kg (Calculated) : 77.6  Vital Signs: Temp: 98.1 F (36.7 C) (12/06 0807) Temp Source: Oral (12/06 0807) BP: 118/94 (12/06 0600) Pulse Rate: 82 (12/06 0600)  Labs:  Recent Labs  03/28/16 1101 03/28/16 1106 03/28/16 1813 03/28/16 2332 03/29/16 0453 03/30/16 0402  HGB 14.2 15.3  --   --   --  13.5  HCT 44.9 45.0  --   --   --  42.0  PLT 287  --   --   --   --  277  LABPROT 28.6*  --   --   --  38.8* 41.3*  INR 2.63  --   --   --  3.85 4.19*  CREATININE 0.84 0.90  --   --  0.90 0.88  TROPONINI  --   --  0.03* <0.03 0.03*  --    Estimated Creatinine Clearance: 121.7 mL/min (by C-G formula based on SCr of 0.88 mg/dL).  Medical History: Past Medical History:  Diagnosis Date  . Arthritis   . Asthma   . BPH (benign prostatic hypertrophy)   . Colon polyps   . COPD (chronic obstructive pulmonary disease) ([place])   . Diabetes mellitus   . GERD (gastroesophageal reflux disease)   . History of oxygen administration    occ. oxygen use at 2 l/m as needed- not using at this time  . HTN (hypertension)    x 3 years  . Hyperlipidemia   . Lung nodules 03/11/2013  . Mediastinal adenopathy 06/20/2013   CT  & PET 2/15   . 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 NHL (non-Hodgkin's lymphoma) ([place])    nhl dx 9/11  . Obesity    exogenous  . Shingles   . Sleep apnea    recent study 04-07-14 awaiting results, no cpap yet.-Dr. Glynn Octave   Medications:  Prescriptions Prior to Admission  Medication Sig Dispense Refill Last Dose  . acetaminophen (TYLENOL) 500 MG  tablet Take 1,000 mg by mouth every 6 (six) hours as needed for headache. Reported on 10/08/2015   unknown  . ALPRAZolam (XANAX) 0.25 MG tablet TAKE ONE TABLET BY MOUTH AT BEDTIME 30 tablet 2 unknown  . Cholecalciferol (VITAMIN D3) 5000 UNITS TABS Take 1 tablet by mouth every morning.   unknown  . enalapril (VASOTEC) 20 MG tablet TAKE ONE TABLET BY MOUTH ONCE DAILY 30 tablet 5 unknown at Unknown time  . guaiFENesin (MUCINEX) 600 MG 12 hr tablet Take 600 mg by mouth 2 (two) times daily as needed for cough or to loosen phlegm.   Past Week at Unknown time  . insulin NPH Human (HUMULIN N,NOVOLIN N) 100 UNIT/ML injection Inject 35 Units into the skin daily before breakfast.    unknown  . ipratropium-albuterol (DUONEB) 0.5-2.5 (3) MG/3ML SOLN PLACE 1 VIAL (3 MLS) IN THE NEBULIZER EVERY 6 HOURS AS NEEDED 1080 mL 0 Past Week at Unknown time  . metFORMIN (GLUCOPHAGE) 500 MG tablet TAKE ONE TABLET BY MOUTH IN THE MORNING AND TAKE  TWO TABLETS IN THE EVENING 270 tablet 0 unknown  . metoprolol (  LOPRESSOR) 100 MG tablet TAKE ONE TABLET BY MOUTH TWICE DAILY 180 tablet 1 unknown  . NOVOLIN R RELION 100 UNIT/ML injection INJECT 32 UNITS SUBCUTANEOUSLY TWICE A DAY BEFORE A MEAL 20 mL 2 unknown  . simvastatin (ZOCOR) 40 MG tablet TAKE ONE TABLET BY MOUTH ONCE DAILY 90 tablet 0 unknown  . warfarin (COUMADIN) 5 MG tablet TAKE ONE & ONE-HALF TABLETS BY MOUTH ONCE DAILY EVERY MONDAY AND EVERY FRIDAY THEN TAKE ONE  TABLET THE REST OF THE WEEK 140 tablet 2 unknown  . albuterol (PROAIR HFA) 108 (90 BASE) MCG/ACT inhaler Inhale 2 puffs into the lungs every 6 (six) hours as needed for wheezing or shortness of breath. 8.5 g 5 Taking  . Blood Glucose Calibration (TAI DOC CONTROL) NORMAL SOLN Use as directed.   Taking  . Blood Glucose Monitoring Suppl (CLEVER CHEK AUTO-CODE VOICE) DEVI USE AS DIRECTED 1 each 2 Taking  . Lancet Devices (LANCING DEVICE) MISC USE AS DIRECTED 1 each 2 Taking   Assessment: 65yo male presented to ED  unresponsive and with low blood sugar.  Asked to resume home Warfarin and initiate Vancomycin for pna.  Pt is morbidly obese with good renal fxn.  INR 4.19, elevated level.   Goal of Therapy:  INR 2-3 Monitor platelets by anticoagulation protocol: Yes  Plan:  No Coumadin today PT/INR daily Monitor for S/S of bleeding  Isac Sarna, BS [place]ena [place], BCPS Clinical Pharmacist Pager [phone] 03/30/2016,10:40 AM

## 2016-03-30 NOTE — Evaluation (Signed)
Physical Therapy Evaluation Patient Details Name: Justin Ruiz MRN: [id number] DOB: [birth year] Today's Date: 03/30/2016   History of Present Illness  65 y/o male with multiple medical problems who was brought to ED when he was found unresponsive and hypoglycemic. He has had cough and cold symptoms for the past week. He was found to have respiratory failure due to pneumonia/CHF. He was started on bipap for hypercapnea and has since improved. Blood sugars have also improved by holding insulin. He is being diuresed for CHF and on abx for pneumonia.   Clinical Impression  Pt received in bed, and was agreeable to PT evaluation.  Pt expressed that he is normally independent with ambulation, as well as all ADL's, and IADL's.  During PT evaluation today, he is not oriented to time, and only partially oriented to situation.  He is also somewhat lethargic, closing eyes a lot during questioning.  He was able to transfer from the bed<>chair with Min guard.  Further mobility is limited due to pt's lethargy as well as elevated INR at 4.19.   Will see pt 1-2 more times to ensure mobility, however pt will likely not need any follow up PT upon d/c.      Follow Up Recommendations No PT follow up    Equipment Recommendations  None recommended by PT    Recommendations for Other Services       Precautions / Restrictions Precautions Precautions: None Restrictions Weight Bearing Restrictions: No      Mobility  Bed Mobility Overal bed mobility: Modified Independent                Transfers Overall transfer level: Needs assistance Equipment used: None Transfers: Sit to/from Stand;Stand Pivot Transfers Sit to Stand: Min guard Stand pivot transfers: Min guard          Ambulation/Gait Ambulation/Gait assistance:  (NA - limited due to unsteadiness and lethargy with INR of 4.19 and increased risk for hemarthrosis. )              Stairs            Wheelchair Mobility    Modified  Rankin (Stroke Patients Only)       Balance Overall balance assessment: Needs assistance Sitting-balance support: Bilateral upper extremity supported;Feet supported Sitting balance-Leahy Scale: Fair     Standing balance support: No upper extremity supported Standing balance-Leahy Scale: Fair                               Pertinent Vitals/Pain Pain Assessment: No/denies pain    Home Living   Living Arrangements: Spouse/significant other   Type of Home: House Home Access: Stairs to enter   [employer] of Steps: 2 Home Layout: One level Home Equipment: Cane - single point;Shower seat;Bedside commode      Prior Function Level of Independence: Independent with assistive device(s)   Gait / Transfers Assistance Needed: Pt uses cane for ambulation  ADL's / Homemaking Assistance Needed: independent with dressing, bathing, driving, community ambulator.         Hand Dominance   Dominant Hand: Right    Extremity/Trunk Assessment   Upper Extremity Assessment: Overall WFL for tasks assessed           Lower Extremity Assessment: Overall WFL for tasks assessed         Communication      Cognition Arousal/Alertness: Lethargic Behavior During Therapy: WFL for tasks assessed/performed Overall Cognitive  Status: Impaired/Different from baseline Area of Impairment: Orientation Orientation Level: Time;Disoriented to;Situation (Mar [birth year] 1996, and states he is here for low O2, but not able to express that he was hypoglycemic.  )                  General Comments      Exercises     Assessment/Plan    PT Assessment Patient needs continued PT services (1-2 more tx's to ensure balance and mobility with gait. )  PT Problem List Decreased activity tolerance;Decreased mobility;Decreased cognition;Decreased safety awareness;Decreased knowledge of precautions;Obesity          PT Treatment Interventions Gait training;DME instruction;Functional  mobility training;Therapeutic activities;Therapeutic exercise;Balance training;Patient/family education    PT Goals (Current goals can be found in the Care Plan section)  Acute Rehab PT Goals Patient Stated Goal: to go home PT Goal Formulation: With patient Time For Goal Achievement: 04/06/16 Potential to Achieve Goals: Good    Frequency Other (Comment) (1-2 more tx)   Barriers to discharge        Co-evaluation               End of Session Equipment Utilized During Treatment: Gait belt;Oxygen Activity Tolerance: Patient tolerated treatment well Patient left: in chair;with call bell/phone within reach Nurse Communication: Mobility status (Mysti, RN notified of pt's mobility status, as well as pt's location.  Mobility sheet left in the room. )    Functional Assessment Tool Used: [place] "6-clicks"  Functional Limitation: Mobility: Walking and moving around Mobility: Walking and Moving Around Current Status [phone]): At least 20 percent but less than 40 percent impaired, limited or restricted Mobility: Walking and Moving Around Goal Status [phone]): At least 1 percent but less than 20 percent impaired, limited or restricted    Time: 1434-1454 PT Time Calculation (min) (ACUTE ONLY): 20 min   Charges:   PT Evaluation $PT Eval Low Complexity: 1 Procedure     PT G Codes:   PT G-Codes **NOT FOR INPATIENT CLASS** Functional Assessment Tool Used: [employer] "6-clicks"  Functional Limitation: Mobility: Walking and moving around Mobility: Walking and Moving Around Current Status [phone]): At least 20 percent but less than 40 percent impaired, limited or restricted Mobility: Walking and Moving Around Goal Status [phone]): At least 1 percent but less than 20 percent impaired, limited or restricted    Beth Cyniah Gossard, PT, DPT X: [phone]

## 2016-03-31 ENCOUNTER — Other Ambulatory Visit: Payer: Commercial Managed Care - HMO

## 2016-03-31 ENCOUNTER — Inpatient Hospital Stay (HOSPITAL_COMMUNITY): Payer: Commercial Managed Care - HMO

## 2016-03-31 DIAGNOSIS — J9621 Acute and chronic respiratory failure with hypoxia: Secondary | ICD-10-CM

## 2016-03-31 DIAGNOSIS — I4891 Unspecified atrial fibrillation: Secondary | ICD-10-CM

## 2016-03-31 LAB — CBC
HCT: 41.9 % (ref 39.0–52.0)
HEMOGLOBIN: 13.5 g/dL (ref 13.0–17.0)
MCH: 30.1 pg (ref 26.0–34.0)
MCHC: 32.2 g/dL (ref 30.0–36.0)
MCV: 93.3 fL (ref 78.0–100.0)
Platelets: 269 10*3/uL (ref 150–400)
RBC: 4.49 MIL/uL (ref 4.22–5.81)
RDW: 12.8 % (ref 11.5–15.5)
WBC: 14.1 10*3/uL — AB (ref 4.0–10.5)

## 2016-03-31 LAB — BLOOD GAS, ARTERIAL
ACID-BASE EXCESS: 10.3 mmol/L — AB (ref 0.0–2.0)
BICARBONATE: 32.7 mmol/L — AB (ref 20.0–28.0)
DRAWN BY: 277331
O2 Content: 15 L/min
O2 Saturation: 91.5 %
PATIENT TEMPERATURE: 37
PO2 ART: 66.9 mmHg — AB (ref 83.0–108.0)
pCO2 arterial: 53.1 mmHg — ABNORMAL HIGH (ref 32.0–48.0)
pH, Arterial: 7.434 (ref 7.350–7.450)

## 2016-03-31 LAB — GLUCOSE, CAPILLARY
GLUCOSE-CAPILLARY: 222 mg/dL — AB (ref 65–99)
GLUCOSE-CAPILLARY: 360 mg/dL — AB (ref 65–99)
GLUCOSE-CAPILLARY: 306 mg/dL — AB (ref 65–99)
Glucose-Capillary: 289 mg/dL — ABNORMAL HIGH (ref 65–99)

## 2016-03-31 LAB — BASIC METABOLIC PANEL
ANION GAP: 6 (ref 5–15)
BUN: 44 mg/dL — ABNORMAL HIGH (ref 6–20)
CHLORIDE: 93 mmol/L — AB (ref 101–111)
CO2: 37 mmol/L — AB (ref 22–32)
Calcium: 8.9 mg/dL (ref 8.9–10.3)
Creatinine, Ser: 1.16 mg/dL (ref 0.61–1.24)
GFR calc non Af Amer: >60 mL/min (ref >60)
Glucose, Bld: 239 mg/dL — ABNORMAL HIGH (ref 65–99)
Potassium: 4.7 mmol/L (ref 3.5–5.1)
Sodium: 136 mmol/L (ref 135–145)

## 2016-03-31 LAB — PROTIME-INR
INR: 2.73
Prothrombin Time: 29.5 seconds — ABNORMAL HIGH (ref 11.4–15.2)

## 2016-03-31 LAB — MAGNESIUM: MAGNESIUM: 1.4 mg/dL — AB (ref 1.7–2.4)

## 2016-03-31 MED ORDER — FUROSEMIDE 40 MG PO TABS: 40.0000 mg | ORAL_TABLET | Freq: Every day | ORAL | Status: DC

## 2016-03-31 MED ORDER — FUROSEMIDE 10 MG/ML IJ SOLN: 40.0000 mg | Freq: Every day | INTRAMUSCULAR | Status: DC

## 2016-03-31 MED ORDER — FUROSEMIDE 10 MG/ML IJ SOLN
40.0000 mg | Freq: Every day | INTRAMUSCULAR | Status: DC
  Administered 2016-04-01 – 2016-04-02 (×2): 40 mg via INTRAVENOUS
  Filled 2016-03-31 (×2): qty 4

## 2016-03-31 MED ORDER — MAGNESIUM SULFATE 2 GM/50ML IV SOLN
2.0000 g | Freq: Once | INTRAVENOUS | Status: AC
Start: 2016-03-31 — End: 2016-03-31
  Administered 2016-03-31: 2 g via INTRAVENOUS
  Filled 2016-03-31: qty 50

## 2016-03-31 MED ORDER — WARFARIN SODIUM 5 MG PO TABS
5.0000 mg | ORAL_TABLET | Freq: Once | ORAL | Status: AC
  Administered 2016-03-31: 5 mg via ORAL
  Filled 2016-03-31: qty 1

## 2016-03-31 NOTE — Plan of Care (Signed)
Problem: Skin Integrity: Goal: Risk for impaired skin integrity will decrease Outcome: Progressing Patient moving from bed to chair with assistance and tolerating well.

## 2016-03-31 NOTE — Progress Notes (Signed)
Addendum: 3:30 PM  Patient had worsening hypoxia with o2 sat around 86 % requiring 5-6 liters of oxygen. Examined at bedside. Patient denies shortness of breath, chest tightness or cough. He looks weak. He is able to speak full sentences.  Heart rate 85, oxygen saturation 87-88% in 5-6 L of oxygen. Patient reported that he uses CPAP at home Respiratory: Decreased breath sound bilaterally, no wheezing appreciated  Plan:  -Stat chest x-ray -Stat ABG -Start BiPAP.  Patient is having good urine output with IV Lasix. The chest x-ray done this morning shows improved pulmonary congestion. Continue to monitor in ICU.  Discussed with the patient and nursing staff.

## 2016-03-31 NOTE — Plan of Care (Signed)
Problem: Education: Goal: Knowledge of  General Education information/materials will improve Outcome: Not Progressing Pt remains to drowsy to be educated at this time  Problem: Skin Integrity: Goal: Risk for impaired skin integrity will decrease Outcome: Progressing Assisting pt to reposition

## 2016-03-31 NOTE — Progress Notes (Signed)
Physical Therapy Treatment Patient Details Name: Justin Ruiz MRN: [id number] DOB: 07-10-1950 Today's Date: 03/31/2016    History of Present Illness 65 y/o male with multiple medical problems who was brought to ED when he was found unresponsive and hypoglycemic. He has had cough and cold symptoms for the past week. He was found to have respiratory failure due to pneumonia/CHF. He was started on bipap for hypercapnea and has since improved. Blood sugars have also improved by holding insulin. He is being diuresed for CHF and on abx for pneumonia.     PT Comments    Pt received sitting up in the chair, and was agreeable to PT tx.  Pt performed sit<>stand with supervision/min guard and RW, and ambulated 276ft with RW and min guard with 6L of O2.  HR during PT tx today ranged from 87bpm at rest up to 126bpm at peak during ambulation, and then recovery after 1-2 min of seated rest was 100bpm.  Will attempt to have pt ambulate without RW at next visit, and continue to plan for him to d/c home without any PT follow up.    Follow Up Recommendations  No PT follow up     Equipment Recommendations  None recommended by PT    Recommendations for Other Services       Precautions / Restrictions Precautions Precautions: None Restrictions Weight Bearing Restrictions: No    Mobility  Bed Mobility Overal bed mobility:  (Not assessed)                Transfers Overall transfer level: Needs assistance Equipment used: Rolling walker (2 wheeled) Transfers: Sit to/from Stand Sit to Stand: Min guard;Supervision            Ambulation/Gait Ambulation/Gait assistance: Min guard Ambulation Distance (Feet): 200 Feet Assistive device: Rolling walker (2 wheeled) Gait Pattern/deviations: Step-through pattern     General Gait Details: good cadence, 2 short standing rest breaks to manage lines/tubes.  During ambulation HR elevated up to 126bpm, however decreased back down to 100bpm after 1-2  min of seated rest break at end of tx.  Will attempt gait without RW at next tx.    Stairs            Wheelchair Mobility    Modified Rankin (Stroke Patients Only)       Balance           Standing balance support: Bilateral upper extremity supported Standing balance-Leahy Scale: Fair                      Cognition Arousal/Alertness: Awake/alert Behavior During Therapy: WFL for tasks assessed/performed Overall Cognitive Status: Within Functional Limits for tasks assessed                      Exercises      General Comments        Pertinent Vitals/Pain Pain Assessment: No/denies pain    Home Living                      Prior Function            PT Goals (current goals can now be found in the care plan section) Acute Rehab PT Goals Patient Stated Goal: to go home PT Goal Formulation: With patient Time For Goal Achievement: 04/06/16 Potential to Achieve Goals: Good Progress towards PT goals: Progressing toward goals    Frequency    Other (Comment) (1-2 more visits)  PT Plan Current plan remains appropriate    Co-evaluation             End of Session Equipment Utilized During Treatment: Gait belt;Oxygen Activity Tolerance: Patient tolerated treatment well Patient left: in chair;with call bell/phone within reach;with chair alarm set     Time: [id number] PT Time Calculation (min) (ACUTE ONLY): 29 min  Charges:  $Gait Training: 23-37 mins                    G Codes:      Beth Myrick Mcnairy, PT, DPT X: [phone]

## 2016-03-31 NOTE — Progress Notes (Signed)
ANTICOAGULATION   Pharmacy Consult for Coumadin (chronic Rx PTA)  Indication: Afib  Allergies  Allergen Reactions  . Avapro [Irbesartan] Other (See Comments)    "doesnt sit right with me"--light headed  . Lipitor [Atorvastatin Calcium] Other (See Comments)    Leg pain  . Penicillins     Unknown reaction   Patient Measurements: Height: 6' (182.9 cm) Weight: (!) 303 lb 5.7 oz (137.6 kg) IBW/kg (Calculated) : 77.6  Vital Signs: Temp: 97.8 F (36.6 C) (12/07 0730) Temp Source: Oral (12/07 0730) BP: 132/86 (12/07 0600) Pulse Rate: 72 (12/07 0600)  Labs:  Recent Labs  03/28/16 1101 03/28/16 1106 03/28/16 1813 03/28/16 2332 03/29/16 0453 03/30/16 0402 03/31/16 0427  HGB 14.2 15.3  --   --   --  13.5 13.5  HCT 44.9 45.0  --   --   --  42.0 41.9  PLT 287  --   --   --   --  277 269  LABPROT 28.6*  --   --   --  38.8* 41.3* 29.5*  INR 2.63  --   --   --  3.85 4.19* 2.73  CREATININE 0.84 0.90  --   --  0.90 0.88 1.16  TROPONINI  --   --  0.03* <0.03 0.03*  --   --    Estimated Creatinine Clearance: 91.2 mL/min (by C-G formula based on SCr of 1.16 mg/dL).  Medical History: Past Medical History:  Diagnosis Date  . Arthritis   . Asthma   . BPH (benign prostatic hypertrophy)   . Colon polyps   . COPD (chronic obstructive pulmonary disease) ([place])   . Diabetes mellitus   . GERD (gastroesophageal reflux disease)   . History of oxygen administration    occ. oxygen use at 2 l/m as needed- not using at this time  . HTN (hypertension)    x 3 years  . Hyperlipidemia   . Lung nodules 03/11/2013  . Mediastinal adenopathy 06/20/2013   CT  & PET 2/15   . 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 NHL (non-Hodgkin's lymphoma) ([place])    nhl dx 9/11  . Obesity    exogenous  . Shingles   . Sleep apnea    recent study 04-07-14 awaiting results, no cpap yet.-Dr. Glynn Octave   Medications:  Prescriptions Prior to Admission  Medication Sig Dispense Refill  Last Dose  . acetaminophen (TYLENOL) 500 MG tablet Take 1,000 mg by mouth every 6 (six) hours as needed for headache. Reported on 10/08/2015   unknown  . ALPRAZolam (XANAX) 0.25 MG tablet TAKE ONE TABLET BY MOUTH AT BEDTIME 30 tablet 2 unknown  . Cholecalciferol (VITAMIN D3) 5000 UNITS TABS Take 1 tablet by mouth every morning.   unknown  . enalapril (VASOTEC) 20 MG tablet TAKE ONE TABLET BY MOUTH ONCE DAILY (Patient taking differently: TAKE ONE TABLET BY MOUTH TWICE DAILY) 30 tablet 5 unknown at Unknown time  . guaiFENesin (MUCINEX) 600 MG 12 hr tablet Take 600 mg by mouth 2 (two) times daily as needed for cough or to loosen phlegm.   Past Week at Unknown time  . insulin NPH Human (HUMULIN N,NOVOLIN N) 100 UNIT/ML injection Inject 35 Units into the skin daily before breakfast.    unknown  . insulin regular (NOVOLIN R,HUMULIN R) 250 units/2.78m (100 units/mL) injection Inject 10 Units into the skin 2 (two) times daily before a meal. States he use regular insulin needle and he only use this insulin only  as needed as back up if Novolin R does not work     . ipratropium-albuterol (DUONEB) 0.5-2.5 (3) MG/3ML SOLN PLACE 1 VIAL (3 MLS) IN THE NEBULIZER EVERY 6 HOURS AS NEEDED 1080 mL 0 Past Week at Unknown time  . metFORMIN (GLUCOPHAGE) 500 MG tablet TAKE ONE TABLET BY MOUTH IN THE MORNING AND TAKE  TWO TABLETS IN THE EVENING 270 tablet 0 unknown  . metoprolol (LOPRESSOR) 100 MG tablet TAKE ONE TABLET BY MOUTH TWICE DAILY 180 tablet 1 unknown  . NOVOLIN R RELION 100 UNIT/ML injection INJECT 32 UNITS SUBCUTANEOUSLY TWICE A DAY BEFORE A MEAL 20 mL 2 unknown  . simvastatin (ZOCOR) 40 MG tablet TAKE ONE TABLET BY MOUTH ONCE DAILY 90 tablet 0 unknown  . warfarin (COUMADIN) 5 MG tablet TAKE ONE & ONE-HALF TABLETS BY MOUTH ONCE DAILY EVERY MONDAY AND EVERY FRIDAY THEN TAKE ONE  TABLET THE REST OF THE WEEK 140 tablet 2 unknown  . albuterol (PROAIR HFA) 108 (90 BASE) MCG/ACT inhaler Inhale 2 puffs into the lungs every  6 (six) hours as needed for wheezing or shortness of breath. 8.5 g 5 Taking  . Blood Glucose Calibration (TAI DOC CONTROL) NORMAL SOLN Use as directed.   Taking  . Blood Glucose Monitoring Suppl (CLEVER CHEK AUTO-CODE VOICE) DEVI USE AS DIRECTED 1 each 2 Taking  . Lancet Devices (LANCING DEVICE) MISC USE AS DIRECTED 1 each 2 Taking   Assessment: 65yo male presented to ED unresponsive and with low blood sugar.  Asked to resume home Warfarin and initiate Vancomycin for pna.  Pt is morbidly obese with good renal fxn.  INR 2.73, will restart coumadin.   Goal of Therapy:  INR 2-3 Monitor platelets by anticoagulation protocol: Yes  Plan:  Coumadin  34m x 1 today PT/INR daily Monitor for S/S of bleeding  LIsac Sarna BS PVena Austria BCPS Clinical Pharmacist Pager #[phone]/10/2015,10:38 AM

## 2016-03-31 NOTE — Progress Notes (Addendum)
PROGRESS NOTE    Justin Ruiz  [id number] DOB: July 26, 1950 65/07[birth year]2015 PCP: Redge Gainer, MD   Brief Narrative: 65 y/o male with multiple medical problems who was brought to ED when he was found unresponsive and hypoglycemic. He has had cough and cold symptoms for the past week. He was found to have respiratory failure due to pneumonia/CHF. He was started on bipap for hypercapnea and has since improved.  He is being diuresed for CHF and on abx for pneumonia.   Assessment & Plan:   # Acute on chronic hypoxic/hypercapnic respiratory failure. Likely related to congestive heart failure and pneumonia. The patient has underlying COPD.  -Initially required bipap but ABG improved.  -still requiring 4 L of oxygen via nasal cannula.  He uses 2 L of altered home. Try to wean gradually.  # Acute on chronic congestive heart failure, likely diastolic. Repeat echocardiogram with left ventricular ejection fraction of 50%. Indeterminate diastolic function due to the afib.  -Chest x-ray today showed improving pulmonary congestion. Serum creatinine level mildly elevated today therefore I will change IV Lasix to daily. Patient had a urine output of 4.2 L in the last 24 hours -Continue metoprolol.  - Monitor intake and output.  # Atrial fibrillation with rapid ventricular response. s/p Cardizem drip. Currently on metoprolol 100 mg twice a day. Heart rate is fluctuating. Continue telemetry monitoring. He is on Coumadin for systemic anticoagulation. Monitor INR.   # Community acquired pneumonia. Presented with shortness of breath and x-ray consistent with possible pneumonia. Continue Levaquin. Cultures negative.   # Acute metabolic encephalopathy. Likely related to hypoglycemia as well as elevated PCO2 on admission. CT head was negative. Continue to monitor. Mental status improving.  # Uncontrolled insulin-dependent diabetes with hyperglycemia: Switched to oral prednisone. Likely stop prednisone  soon after short course. Continue current insulin regimen. Blood sugar is still high but better controlled. -a1c 5.9 only. He was admitted with hypoglycemia on admission.  # COPD. Stable. Continue on bronchodilators, antibiotics, solumedrol switched to prednisone.  # Hypertension. Blood pressure stable. Continue Lasix and metoprolol.  #Hypomagnesemia likely due to diuretics: Repleted magnesium sulfate. Monitor labs.  # Diffuse large B-cell lymphoma. Follow-up with oncology.   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 He was alert awake. Reports overall feeling better. Denied chest pain. Has mild dry cough but denies shortness of breath. No headache, dizziness. No new event. Objective: Vitals:   03/31/16 0600 03/31/16 0730 03/31/16 0733 03/31/16 1156  BP: 132/86     Pulse: 72     Resp: 14     Temp:  97.8 F (36.6 C)  98.4 F (36.9 C)  TempSrc:  Oral  Oral  SpO2: 94%  94%   Weight:      Height:  Intake/Output Summary (Last 24 hours) at 03/31/16 1223 Last data filed at 03/31/16 0830  Gross per 24 hour  Intake             1320 ml  Output             5625 ml  Net            -4305 ml   Filed Weights   03/29/16 0500 03/30/16  0500 03/31/16 0500  Weight: (!) 141.8 kg (312 lb 9.8 oz) (!) 140.5 kg (309 lb 11.9 oz) (!) 137.6 kg (303 lb 5.7 oz)    Examination:  General exam: Appears calm and comfortable  Respiratory system: Bibasal decreased breath sound with occasional expiratory wheeze  Cardiovascular system: S1 & S2 heard, irregular.  No pedal edema. Gastrointestinal system: Abdomen is nondistended, soft and nontender. Normal bowel sounds heard. Central nervous system: Alert awake, following commands. Extremities: Symmetric 5 x 5 power. Skin: No rashes, lesions or ulcers Psychiatry: Judgement and insight appear good.     Data Reviewed: I have personally reviewed following labs and imaging studies  CBC:  Recent Labs Lab 03/28/16 1101 03/28/16 1106 03/30/16 0402 03/31/16 0427  WBC 8.6  --  14.9* 14.1*  NEUTROABS 6.7  --   --   --   HGB 14.2 15.3 13.5 13.5  HCT 44.9 45.0 42.0 41.9  MCV 97.8  --  94.8 93.3  PLT 287  --  277 [id number]   Basic Metabolic Panel:  Recent Labs Lab 03/28/16 1101 03/28/16 1106 03/29/16 0453 03/29/16 1146 03/29/16 1651 03/30/16 0402 03/31/16 0427  NA 138 139 135  --   --  137 136  K 4.9 4.9 5.4*  --   --  4.8 4.7  CL 99* 100* 98*  --   --  96* 93*  CO2 33*  --  29  --   --  34* 37*  GLUCOSE 182* 171* 356* 493* 446* 170* 239*  BUN 30* 32* 32*  --   --  37* 44*  CREATININE 0.84 0.90 0.90  --   --  0.88 1.16  CALCIUM 8.8*  --  8.5*  --   --  8.9 8.9  MG  --   --   --   --   --   --  1.4*   GFR: Estimated Creatinine Clearance: 91.2 mL/min (by C-G formula based on SCr of 1.16 mg/dL). Liver Function Tests:  Recent Labs Lab 03/28/16 1101  AST 23  ALT 21  ALKPHOS 98  BILITOT 0.6  PROT 7.1  ALBUMIN 2.9*   No results for input(s): LIPASE, AMYLASE in the last 168 hours. No results for input(s): AMMONIA in the last 168 hours. Coagulation Profile:  Recent Labs Lab 03/28/16 1101 03/29/16 0453 03/30/16 0402 03/31/16 0427  INR 2.63 3.85 4.19* 2.73   Cardiac  Enzymes:  Recent Labs Lab 03/28/16 1813 03/28/16 2332 03/29/16 0453  TROPONINI 0.03* <0.03 0.03*   BNP (last 3 results) No results for input(s): PROBNP in the last 8760 hours. HbA1C: No results for input(s): HGBA1C in the last 72 hours. CBG:  Recent Labs Lab 03/30/16 0733 03/30/16 1130 03/30/16 1622 03/31/16 0739 03/31/16 1121  GLUCAP 202* 467* 434* 222* 360*   Lipid Profile: No results for input(s): CHOL, HDL, LDLCALC, TRIG, CHOLHDL, LDLDIRECT in the last 72 hours. Thyroid Function Tests:  Recent Labs  03/28/16 1813  TSH 0.325*   Anemia Panel: No results for input(s): VITAMINB12, FOLATE, FERRITIN, TIBC, IRON, RETICCTPCT in  the last 72 hours. Sepsis Labs:  Recent Labs Lab 03/28/16 1131 03/28/16 1452  LATICACIDVEN 0.87 1.4    Recent Results (from the past 240 hour(s))  Blood Culture (routine x 2)     Status: None (Preliminary result)   Collection Time: 03/28/16 11:01 AM  Result Value Ref Range Status   Specimen Description BLOOD DRAWN BY IV THERAPY  Final   Special Requests BOTTLES DRAWN AEROBIC AND ANAEROBIC 6 CC EACH  Final   Culture NO GROWTH 2 DAYS  Final   Report Status PENDING  Incomplete  Blood Culture (routine x 2)     Status: None (Preliminary result)   Collection Time: 03/28/16 11:17 AM  Result Value Ref Range Status   Specimen Description BLOOD RIGHT WRIST  Final   Special Requests BOTTLES DRAWN AEROBIC AND ANAEROBIC 5 CC EACH  Final   Culture NO GROWTH 2 DAYS  Final   Report Status PENDING  Incomplete  Urine culture     Status: None   Collection Time: 03/28/16 11:29 AM  Result Value Ref Range Status   Specimen Description URINE, CATHETERIZED  Final   Special Requests NONE  Final   Culture NO GROWTH Performed at [facility]   Final   Report Status 03/29/2016 FINAL  Final  MRSA PCR Screening     Status: None   Collection Time: 03/28/16  4:12 PM  Result Value Ref Range Status   MRSA by PCR NEGATIVE NEGATIVE Final    Comment:         The GeneXpert MRSA Assay (FDA approved for NASAL specimens only), is one component of a comprehensive MRSA colonization surveillance program. It is not intended to diagnose MRSA infection nor to guide or monitor treatment for MRSA infections.          Radiology Studies: Dg Chest 1 View  Result Date: 03/31/2016 CLINICAL DATA:  Shortness of Breath EXAM: CHEST 1 VIEW COMPARISON:  03/28/2016 FINDINGS: Interstitial opacities have improved since prior study. Probable small layering effusions with bibasilar atelectasis. Heart is borderline in size. IMPRESSION: Improving interstitial edema. Bibasilar atelectasis and small effusions. Electronically Signed   By: Rolm Baptise M.D.   On: 03/31/2016 08:26        Scheduled Meds: . [START ON 04/01/2016] furosemide  40 mg Intravenous Daily  . insulin aspart  0-12 Units Subcutaneous QHS  . insulin aspart  0-20 Units Subcutaneous TID WC  . insulin aspart  10 Units Subcutaneous TID WC  . insulin glargine  45 Units Subcutaneous Daily  . ipratropium-albuterol  3 mL Nebulization TID  . levofloxacin (LEVAQUIN) IV  750 mg Intravenous Q24H  . magnesium sulfate 1 - 4 g bolus IVPB  2 g Intravenous Once  . mouth rinse  15 mL Mouth Rinse BID  . metoprolol  100 mg Oral BID  . predniSONE  50 mg Oral Q breakfast  . simvastatin  40 mg Oral q1800  . sodium chloride flush  3 mL Intravenous Q12H  . warfarin  5 mg Oral Once  . Warfarin - Pharmacist Dosing Inpatient   Does not apply q1800   Continuous Infusions:   LOS: 3 days    Moxon Messler Tanna Furry, MD [facility] Pager [phone]  If 7PM-7AM, please contact night-coverage [email or web address] Password [facility] 03/31/2016, 12:23 PM

## 2016-03-31 NOTE — Progress Notes (Signed)
Around 1500 pt had worsening oxygenation with sats dropping into 80's, around 85-86%, RN increased o2 to 15 L HFNC with no response in sats. Breath sounds decreased bilaterally. Pt denied any shortness of breath, but was noted to be very lethargic and weak. RT and MD notified. CXR, ABG obtained. Pt placed on bipap. Also had a brief period where HR dropped into 30's but quickly came back up to 70's. Pt completely asymptomatic. Will continue to monitor closely.

## 2016-04-01 LAB — BASIC METABOLIC PANEL
ANION GAP: 6 (ref 5–15)
BUN: 39 mg/dL — ABNORMAL HIGH (ref 6–20)
CALCIUM: 9.3 mg/dL (ref 8.9–10.3)
CO2: 39 mmol/L — ABNORMAL HIGH (ref 22–32)
Chloride: 93 mmol/L — ABNORMAL LOW (ref 101–111)
Creatinine, Ser: 0.97 mg/dL (ref 0.61–1.24)
Glucose, Bld: 136 mg/dL — ABNORMAL HIGH (ref 65–99)
Potassium: 4.9 mmol/L (ref 3.5–5.1)
SODIUM: 138 mmol/L (ref 135–145)

## 2016-04-01 LAB — GLUCOSE, CAPILLARY
GLUCOSE-CAPILLARY: 186 mg/dL — AB (ref 65–99)
GLUCOSE-CAPILLARY: 95 mg/dL (ref 65–99)
Glucose-Capillary: 118 mg/dL — ABNORMAL HIGH (ref 65–99)
Glucose-Capillary: 247 mg/dL — ABNORMAL HIGH (ref 65–99)

## 2016-04-01 LAB — MAGNESIUM: MAGNESIUM: 1.7 mg/dL (ref 1.7–2.4)

## 2016-04-01 LAB — MRSA PCR SCREENING: MRSA by PCR: NEGATIVE

## 2016-04-01 LAB — PROTIME-INR
INR: 1.91
PROTHROMBIN TIME: 22.1 s — AB (ref 11.4–15.2)

## 2016-04-01 MED ORDER — WARFARIN - PHARMACIST DOSING INPATIENT
Status: DC
  Administered 2016-04-01: 16:00:00

## 2016-04-01 MED ORDER — WARFARIN SODIUM 5 MG PO TABS
5.0000 mg | ORAL_TABLET | Freq: Once | ORAL | Status: AC
  Administered 2016-04-01: 5 mg via ORAL
  Filled 2016-04-01: qty 1

## 2016-04-01 NOTE — Progress Notes (Signed)
Inpatient Diabetes Program Recommendations  AACE/ADA: New Consensus Statement on Inpatient Glycemic Control (2015)  Target Ranges:  Prepandial:   less than 140 mg/dL      Peak postprandial:   less than 180 mg/dL (1-2 hours)      Critically ill patients:  140 - 180 mg/dL  Results for MANU, LANDAVAZO (MRN [id number]) as of 04/01/2016 07:43  Ref. Range 03/31/2016 07:39 03/31/2016 11:21 03/31/2016 16:37 03/31/2016 21:13 04/01/2016 07:35  Glucose-Capillary Latest Ref Range: 65 - 99 mg/dL 222 (H) 360 (H) 289 (H) 306 (H) 118 (H)    Review of Glycemic Control  Diabetes history: DM2 Outpatient Diabetes medications: NPH 35 units QAM, Metformin 500 mg QAM, Metformin 1000 mg QPM, Regular 32 units BID Current orders for Inpatient glycemic control: Lantus 45 units daily, Novolog 0-20 units TID with meals, Novolog 0-12 units QHS  Inpatient Diabetes Program Recommendations: Insulin - Meal Coverage: Please consider increasing meal coverage to Novolog 20 units TID with meals.  Thanks, Barnie Alderman, RN, MSN, CDE Diabetes Coordinator Inpatient Diabetes Program [phone] (Team Pager from 8am to 5pm)

## 2016-04-01 NOTE — Progress Notes (Addendum)
ANTICOAGULATION   Pharmacy Consult for Coumadin (chronic Rx PTA)  Indication: Afib  Allergies  Allergen Reactions  . Avapro [Irbesartan] Other (See Comments)    "doesnt sit right with me"--light headed  . Lipitor [Atorvastatin Calcium] Other (See Comments)    Leg pain  . Penicillins     Unknown reaction   Patient Measurements: Height: 6' (182.9 cm) Weight: (!) 301 lb 5.9 oz (136.7 kg) IBW/kg (Calculated) : 77.6  Vital Signs: Temp: 97.5 F (36.4 C) (12/08 0800) Temp Source: Oral (12/08 0400) BP: 116/70 (12/08 0900) Pulse Rate: 77 (12/08 0800)  Labs:  Recent Labs  03/30/16 0402 03/31/16 0427 04/01/16 0447  HGB 13.5 13.5  --   HCT 42.0 41.9  --   PLT 277 269  --   LABPROT 41.3* 29.5* 22.1*  INR 4.19* 2.73 1.91  CREATININE 0.88 1.16 0.97   Estimated Creatinine Clearance: 108.7 mL/min (by C-G formula based on SCr of 0.97 mg/dL).  Medical History: Past Medical History:  Diagnosis Date  . Arthritis   . Asthma   . BPH (benign prostatic hypertrophy)   . Colon polyps   . COPD (chronic obstructive pulmonary disease) ([place])   . Diabetes mellitus   . GERD (gastroesophageal reflux disease)   . History of oxygen administration    occ. oxygen use at 2 l/m as needed- not using at this time  . HTN (hypertension)    x 3 years  . Hyperlipidemia   . Lung nodules 03/11/2013  . Mediastinal adenopathy 06/20/2013   CT  & PET 2/15   . 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 NHL (non-Hodgkin's lymphoma) ([place])    nhl dx 9/11  . Obesity    exogenous  . Shingles   . Sleep apnea    recent study 04-07-14 awaiting results, no cpap yet.-Dr. Glynn Octave   Medications:  Prescriptions Prior to Admission  Medication Sig Dispense Refill Last Dose  . acetaminophen (TYLENOL) 500 MG tablet Take 1,000 mg by mouth every 6 (six) hours as needed for headache. Reported on 10/08/2015   unknown  . ALPRAZolam (XANAX) 0.25 MG tablet TAKE ONE TABLET BY MOUTH AT BEDTIME 30  tablet 2 unknown  . Cholecalciferol (VITAMIN D3) 5000 UNITS TABS Take 1 tablet by mouth every morning.   unknown  . enalapril (VASOTEC) 20 MG tablet TAKE ONE TABLET BY MOUTH ONCE DAILY (Patient taking differently: TAKE ONE TABLET BY MOUTH TWICE DAILY) 30 tablet 5 unknown at Unknown time  . guaiFENesin (MUCINEX) 600 MG 12 hr tablet Take 600 mg by mouth 2 (two) times daily as needed for cough or to loosen phlegm.   Past Week at Unknown time  . insulin NPH Human (HUMULIN N,NOVOLIN N) 100 UNIT/ML injection Inject 35 Units into the skin daily before breakfast.    unknown  . insulin regular (NOVOLIN R,HUMULIN R) 250 units/2.52m (100 units/mL) injection Inject 10 Units into the skin 2 (two) times daily before a meal. States he use regular insulin needle and he only use this insulin only as needed as back up if Novolin R does not work     . ipratropium-albuterol (DUONEB) 0.5-2.5 (3) MG/3ML SOLN PLACE 1 VIAL (3 MLS) IN THE NEBULIZER EVERY 6 HOURS AS NEEDED 1080 mL 0 Past Week at Unknown time  . metFORMIN (GLUCOPHAGE) 500 MG tablet TAKE ONE TABLET BY MOUTH IN THE MORNING AND TAKE  TWO TABLETS IN THE EVENING 270 tablet 0 unknown  . metoprolol (LOPRESSOR) 100 MG tablet TAKE ONE  TABLET BY MOUTH TWICE DAILY 180 tablet 1 unknown  . NOVOLIN R RELION 100 UNIT/ML injection INJECT 32 UNITS SUBCUTANEOUSLY TWICE A DAY BEFORE A MEAL 20 mL 2 unknown  . simvastatin (ZOCOR) 40 MG tablet TAKE ONE TABLET BY MOUTH ONCE DAILY 90 tablet 0 unknown  . warfarin (COUMADIN) 5 MG tablet TAKE ONE & ONE-HALF TABLETS BY MOUTH ONCE DAILY EVERY MONDAY AND EVERY FRIDAY THEN TAKE ONE  TABLET THE REST OF THE WEEK 140 tablet 2 unknown  . albuterol (PROAIR HFA) 108 (90 BASE) MCG/ACT inhaler Inhale 2 puffs into the lungs every 6 (six) hours as needed for wheezing or shortness of breath. 8.5 g 5 Taking  . Blood Glucose Calibration (TAI DOC CONTROL) NORMAL SOLN Use as directed.   Taking  . Blood Glucose Monitoring Suppl (CLEVER CHEK AUTO-CODE VOICE)  DEVI USE AS DIRECTED 1 each 2 Taking  . Lancet Devices (LANCING DEVICE) MISC USE AS DIRECTED 1 each 2 Taking   Assessment: 65yo male presented to ED unresponsive and with low blood sugar.  Asked to resume home Warfarin. INR has trended down, 1.91 today.  Pt received 61m yesterday.  Pt is also on Levaquin which may interact with warfarin and cause INR to be erratic.    Goal of Therapy:  INR 2-3 Monitor platelets by anticoagulation protocol: Yes  Plan:  Coumadin  555mx 1 today PT/INR daily Monitor for S/S of bleeding  ScHart RobinsonsPharmD Clinical Pharmacist Pager:  [phone]/11/2015

## 2016-04-01 NOTE — Consult Note (Signed)
Consult requested by: [facility] Consult requested for respiratory failure:  HPI: This is a 65 year old who has previous history of diabetes sleep apnea COPD atrial fib hypertension non-Hodgkin's lymphoma. He came to the emergency department because of cough congestion shortness of breath and hypoglycemia. He was unresponsive with a blood sugar of 25. He was found to have hypoxic and hypercapnic respiratory failure on admission with pneumonia on chest x-ray which has been treated. He had rapid atrial fib and was started on Cardizem. Last chest x-ray which I have personally reviewed does not show pneumonia now.  Past Medical History:  Diagnosis Date  . Arthritis   . Asthma   . BPH (benign prostatic hypertrophy)   . Colon polyps   . COPD (chronic obstructive pulmonary disease) ([place])   . Diabetes mellitus   . GERD (gastroesophageal reflux disease)   . History of oxygen administration    occ. oxygen use at 2 l/m as needed- not using at this time  . HTN (hypertension)    x 3 years  . Hyperlipidemia   . Lung nodules 03/11/2013  . Mediastinal adenopathy 06/20/2013   CT  & PET 2/15   . 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 NHL (non-Hodgkin's lymphoma) ([place])    nhl dx 9/11  . Obesity    exogenous  . Shingles   . Sleep apnea    recent study 04-07-14 awaiting results, no cpap yet.-Dr. Glynn Octave     Family History  Problem Relation Age of Onset  . Liver cancer Mother   . Diabetes Mother   . Heart disease Mother   . Colon cancer Father   . Prostate cancer Father   . Colon polyps Father   . Pulmonary embolism Sister   . Diabetes Sister   . Heart attack Sister   . Aortic aneurysm Sister   . COPD Sister   . Heart disease Sister   . COPD Sister   . Heart disease Sister   . Diabetes Maternal Grandmother      Social History   Social History  . Marital status: Married    Spouse name: N/A  . Number of children: N/A  . Years of education: N/A    Social History Main Topics  . Smoking status: Former Smoker    Packs/day: 2.00    Years: 40.00    Start date: 12/17/1968    Quit date: 11/14/2004  . Smokeless tobacco: Never Used     Comment: 2 ppd   . Alcohol use No     Comment: previous  . Drug use: No  . Sexual activity: Yes   Other Topics Concern  . None   Social History Narrative   Married, disabled Dealer (COPD). Daily caffeine use - 2.      ROS: Constitutional: He says he's felt bad for about 2 weeks. He's had aching and a feeling of fever. Ears nose mouth and throat: He's had something of a sore throat. Cardiovascular: He has noticed that he's had a little bit of swelling. Respiratory: He's been coughing and congested. Gastrointestinal: No nausea vomiting or diarrhea. Neurologic: He has had episodes of being poorly responsive because of his low blood sugars. Endocrine: He's had trouble keeping his blood sugar up for the last 2 weeks or so. Psychiatric: He denies any anxiety or depression. Review of systems otherwise negative    Objective: Vital signs in last 24 hours: Temp:  [98.2 F (36.8 C)-98.6 F (37 C)] 98.6 F (37 C) (  12/08 0400) Pulse Rate:  [69-123] 69 (12/08 0400) Resp:  [14-25] 15 (12/08 0400) BP: (107-150)/(66-109) 108/73 (12/08 0400) SpO2:  [85 %-98 %] 96 % (12/08 0739) Weight:  [136.7 kg (301 lb 5.9 oz)] 136.7 kg (301 lb 5.9 oz) (12/08 0500) Weight change: -0.9 kg (-1 lb 15.8 oz) Last BM Date: 03/28/16  Intake/Output from previous day: 12/07 0701 - 12/08 0700 In: 720 [P.O.:600; I.V.:120] Out: 3400 [Urine:3400]  PHYSICAL EXAM Constitutional he is lying quietly wearing CPAP. He is obese. He is arousable. Eyes: Pupils react EOMI. Ears nose mouth and throat: Mucous membranes are moist. Hearing is normal. Cardiovascular: His heart is irregularly irregular. Normal heart sounds. I don't hear a gallop. No edema. Respiratory: He is on CPAP. Respiratory effort is normal. His lungs are clear.  Gastrointestinal: His abdomen is soft obese with no masses. Musculoskeletal: Strength in his arms and legs is normal. Neurological: No focal abnormalities  Lab Results: Basic Metabolic Panel:  Recent Labs  03/31/16 0427 04/01/16 0447  NA 136 138  K 4.7 4.9  CL 93* 93*  CO2 37* 39*  GLUCOSE 239* 136*  BUN 44* 39*  CREATININE 1.16 0.97  CALCIUM 8.9 9.3  MG 1.4* 1.7   Liver Function Tests: No results for input(s): AST, ALT, ALKPHOS, BILITOT, PROT, ALBUMIN in the last 72 hours. No results for input(s): LIPASE, AMYLASE in the last 72 hours. No results for input(s): AMMONIA in the last 72 hours. CBC:  Recent Labs  03/30/16 0402 03/31/16 0427  WBC 14.9* 14.1*  HGB 13.5 13.5  HCT 42.0 41.9  MCV 94.8 93.3  PLT 277 269   Cardiac Enzymes: No results for input(s): CKTOTAL, CKMB, CKMBINDEX, TROPONINI in the last 72 hours. BNP: No results for input(s): PROBNP in the last 72 hours. D-Dimer: No results for input(s): DDIMER in the last 72 hours. CBG:  Recent Labs  03/30/16 1622 03/31/16 0739 03/31/16 1121 03/31/16 1637 03/31/16 2113 04/01/16 0735  GLUCAP 434* 222* 360* 289* 306* 118*   Hemoglobin A1C: No results for input(s): HGBA1C in the last 72 hours. Fasting Lipid Panel: No results for input(s): CHOL, HDL, LDLCALC, TRIG, CHOLHDL, LDLDIRECT in the last 72 hours. Thyroid Function Tests: No results for input(s): TSH, T4TOTAL, FREET4, T3FREE, THYROIDAB in the last 72 hours. Anemia Panel: No results for input(s): VITAMINB12, FOLATE, FERRITIN, TIBC, IRON, RETICCTPCT in the last 72 hours. Coagulation:  Recent Labs  03/31/16 0427 04/01/16 0447  LABPROT 29.5* 22.1*  INR 2.73 1.91   Urine Drug Screen: Drugs of Abuse  No results found for: LABOPIA, COCAINSCRNUR, LABBENZ, AMPHETMU, THCU, LABBARB  Alcohol Level: No results for input(s): ETH in the last 72 hours. Urinalysis: No results for input(s): COLORURINE, LABSPEC, PHURINE, GLUCOSEU, HGBUR, BILIRUBINUR,  KETONESUR, PROTEINUR, UROBILINOGEN, NITRITE, LEUKOCYTESUR in the last 72 hours.  Invalid input(s): APPERANCEUR Misc. Labs:   ABGS:  Recent Labs  03/31/16 1540  PHART 7.434  PO2ART 66.9*  HCO3 32.7*     MICROBIOLOGY: Recent Results (from the past 240 hour(s))  Blood Culture (routine x 2)     Status: None (Preliminary result)   Collection Time: 03/28/16 11:01 AM  Result Value Ref Range Status   Specimen Description BLOOD DRAWN BY IV THERAPY  Final   Special Requests BOTTLES DRAWN AEROBIC AND ANAEROBIC 6 CC EACH  Final   Culture NO GROWTH 2 DAYS  Final   Report Status PENDING  Incomplete  Blood Culture (routine x 2)     Status: None (Preliminary result)   Collection  Time: 03/28/16 11:17 AM  Result Value Ref Range Status   Specimen Description BLOOD RIGHT WRIST  Final   Special Requests BOTTLES DRAWN AEROBIC AND ANAEROBIC 5 CC EACH  Final   Culture NO GROWTH 2 DAYS  Final   Report Status PENDING  Incomplete  Urine culture     Status: None   Collection Time: 03/28/16 11:29 AM  Result Value Ref Range Status   Specimen Description URINE, CATHETERIZED  Final   Special Requests NONE  Final   Culture NO GROWTH Performed at [facility]   Final   Report Status 03/29/2016 FINAL  Final  MRSA PCR Screening     Status: None   Collection Time: 03/28/16  4:12 PM  Result Value Ref Range Status   MRSA by PCR NEGATIVE NEGATIVE Final    Comment:        The GeneXpert MRSA Assay (FDA approved for NASAL specimens only), is one component of a comprehensive MRSA colonization surveillance program. It is not intended to diagnose MRSA infection nor to guide or monitor treatment for MRSA infections.     Studies/Results: Dg Chest 1 View  Result Date: 03/31/2016 CLINICAL DATA:  Hypoxia, asthma, history lung nodules EXAM: CHEST 1 VIEW COMPARISON:  03/31/2016, 03/28/2016, 06/07/2015 FINDINGS: The heart size and mediastinal contours are within normal limits. Both lungs are clear.  The visualized skeletal structures are unremarkable. IMPRESSION: No active disease. Electronically Signed   By: Kathreen Devoid   On: 03/31/2016 15:43   Dg Chest 1 View  Result Date: 03/31/2016 CLINICAL DATA:  Shortness of Breath EXAM: CHEST 1 VIEW COMPARISON:  03/28/2016 FINDINGS: Interstitial opacities have improved since prior study. Probable small layering effusions with bibasilar atelectasis. Heart is borderline in size. IMPRESSION: Improving interstitial edema. Bibasilar atelectasis and small effusions. Electronically Signed   By: Rolm Baptise M.D.   On: 03/31/2016 08:26    Medications:  Prior to Admission:  Prescriptions Prior to Admission  Medication Sig Dispense Refill Last Dose  . acetaminophen (TYLENOL) 500 MG tablet Take 1,000 mg by mouth every 6 (six) hours as needed for headache. Reported on 10/08/2015   unknown  . ALPRAZolam (XANAX) 0.25 MG tablet TAKE ONE TABLET BY MOUTH AT BEDTIME 30 tablet 2 unknown  . Cholecalciferol (VITAMIN D3) 5000 UNITS TABS Take 1 tablet by mouth every morning.   unknown  . enalapril (VASOTEC) 20 MG tablet TAKE ONE TABLET BY MOUTH ONCE DAILY (Patient taking differently: TAKE ONE TABLET BY MOUTH TWICE DAILY) 30 tablet 5 unknown at Unknown time  . guaiFENesin (MUCINEX) 600 MG 12 hr tablet Take 600 mg by mouth 2 (two) times daily as needed for cough or to loosen phlegm.   Past Week at Unknown time  . insulin NPH Human (HUMULIN N,NOVOLIN N) 100 UNIT/ML injection Inject 35 Units into the skin daily before breakfast.    unknown  . insulin regular (NOVOLIN R,HUMULIN R) 250 units/2.62m (100 units/mL) injection Inject 10 Units into the skin 2 (two) times daily before a meal. States he use regular insulin needle and he only use this insulin only as needed as back up if Novolin R does not work     . ipratropium-albuterol (DUONEB) 0.5-2.5 (3) MG/3ML SOLN PLACE 1 VIAL (3 MLS) IN THE NEBULIZER EVERY 6 HOURS AS NEEDED 1080 mL 0 Past Week at Unknown time  . metFORMIN  (GLUCOPHAGE) 500 MG tablet TAKE ONE TABLET BY MOUTH IN THE MORNING AND TAKE  TWO TABLETS IN THE EVENING 270 tablet 0  unknown  . metoprolol (LOPRESSOR) 100 MG tablet TAKE ONE TABLET BY MOUTH TWICE DAILY 180 tablet 1 unknown  . NOVOLIN R RELION 100 UNIT/ML injection INJECT 32 UNITS SUBCUTANEOUSLY TWICE A DAY BEFORE A MEAL 20 mL 2 unknown  . simvastatin (ZOCOR) 40 MG tablet TAKE ONE TABLET BY MOUTH ONCE DAILY 90 tablet 0 unknown  . warfarin (COUMADIN) 5 MG tablet TAKE ONE & ONE-HALF TABLETS BY MOUTH ONCE DAILY EVERY MONDAY AND EVERY FRIDAY THEN TAKE ONE  TABLET THE REST OF THE WEEK 140 tablet 2 unknown  . albuterol (PROAIR HFA) 108 (90 BASE) MCG/ACT inhaler Inhale 2 puffs into the lungs every 6 (six) hours as needed for wheezing or shortness of breath. 8.5 g 5 Taking  . Blood Glucose Calibration (TAI DOC CONTROL) NORMAL SOLN Use as directed.   Taking  . Blood Glucose Monitoring Suppl (CLEVER CHEK AUTO-CODE VOICE) DEVI USE AS DIRECTED 1 each 2 Taking  . Lancet Devices (LANCING DEVICE) MISC USE AS DIRECTED 1 each 2 Taking   Scheduled: . furosemide  40 mg Intravenous Daily  . insulin aspart  0-12 Units Subcutaneous QHS  . insulin aspart  0-20 Units Subcutaneous TID WC  . insulin aspart  10 Units Subcutaneous TID WC  . insulin glargine  45 Units Subcutaneous Daily  . ipratropium-albuterol  3 mL Nebulization TID  . levofloxacin (LEVAQUIN) IV  750 mg Intravenous Q24H  . mouth rinse  15 mL Mouth Rinse BID  . metoprolol  100 mg Oral BID  . predniSONE  50 mg Oral Q breakfast  . simvastatin  40 mg Oral q1800  . sodium chloride flush  3 mL Intravenous Q12H  . Warfarin - Pharmacist Dosing Inpatient   Does not apply q1800   Continuous:  DSK:AJGOTL chloride, acetaminophen, ipratropium-albuterol, ondansetron (ZOFRAN) IV, sodium chloride flush  Assesment: He has multiple medical problems. He was admitted with altered mental status likely related to hypoglycemia. He also had x-ray evidence of pneumonia  which now appears to have resolved. He has acute on chronic hypoxic and hypercapnic respiratory failure. He uses oxygen at home. His PCO2 was 48 on blood gas. He was found to have atrial fib with RVR and that is better. He has sleep apnea at baseline and apparently is noncompliant with CPAP at home according to the chart. He does seem to be improving. 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 Although his chest x-ray has cleared and I would continue Levaquin for a total of 5 days. He is on appropriate treatment including steroids inhaled bronchodilators and antibiotics. He is wearing CPAP now and that should help    LOS: 4 days   Wesam Gearhart L 04/01/2016, 7:48 AM

## 2016-04-01 NOTE — Care Management Note (Signed)
Case Management Note  Patient Details  Name: Justin Ruiz MRN: [id number] Date of Birth: Feb 08, 1951  Expected Discharge Date:      04/04/2016            Expected Discharge Plan:  [place]  In-House Referral:  NA  Discharge planning Services  CM Consult  Post Acute Care Choice:  Home Health Choice offered to:  Patient  HH Arranged:  RN [facility] Agency:  [place]  Status of Service:  In process, will continue to follow  Additional Comments: HH disease management discussed with pt. He is agreeable and has chosen [facility] from list of [facility] providers. Blake Divine, of [facility], aware of referral and will obtain pt info from chart. [facility] will be notified when pt is DC'd from hospital.  DC not anticipated over weekend.  CM will cont to follow.  Sherald Barge, RN 04/01/2016, 2:02 PM

## 2016-04-01 NOTE — Progress Notes (Signed)
PROGRESS NOTE    Justin Ruiz  [id number] DOB: 1950-07-28 DOA: 03/28/2016 PCP: Redge Gainer, MD   Brief Narrative: 65 y/o male with multiple medical problems who was brought to ED when he was found unresponsive and hypoglycemic. He has had cough and cold symptoms for the past week. He was found to have respiratory failure due to pneumonia/CHF. He was started on bipap for hypercapnea and has since improved.  He is being diuresed for CHF and on abx for pneumonia.   Assessment & Plan:   # Acute on chronic hypoxic/hypercapnic respiratory failure. Likely related to congestive heart failure and pneumonia. The patient has underlying COPD.  -Required BiPAP yesterday and last night, now on high flow oxygen. Continue antibiotics bronchodilators. - He uses 2 L of altered home. Try to wean gradually.  # Acute on chronic congestive heart failure, likely diastolic. Repeat echocardiogram with left ventricular ejection fraction of 50%. Indeterminate diastolic function due to the afib.  -Patient is negative by 2 mL since admission. Currently on Lasix IV daily. Continue to monitor electrolytes and urine output. May be able to switch to oral Lasix by tomorrow. -Continue metoprolol.  - Monitor intake and output.  # Atrial fibrillation with rapid ventricular response. s/p Cardizem drip. Currently on metoprolol 100 mg twice a day. Heart rate is fluctuating. Continue telemetry monitoring. He is on Coumadin for systemic anticoagulation. Monitor INR.   # Community acquired pneumonia. Presented with shortness of breath and x-ray consistent with possible pneumonia. Continue Levaquin. Cultures negative.   # Acute metabolic encephalopathy. Likely related to hypoglycemia as well as elevated PCO2 on admission. CT head was negative. Continue to monitor. Mental status improved.  # Uncontrolled insulin-dependent diabetes with hyperglycemia: Continue current insulin regimen. Blood sugar level better controlled  now. Monitor blood sugar level. -a1c 5.9 only. He was admitted with hypoglycemia on admission.  # COPD. Stable. Continue on bronchodilators, antibiotics, solumedrol switched to prednisone.  # Hypertension. Blood pressure stable. Continue Lasix and metoprolol.  #Hypomagnesemia likely due to diuretics: Repleted magnesium sulfate. Monitor labs.  # Diffuse large B-cell lymphoma. Follow-up with oncology.   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 He was alert awake and reported feeling good. Denied chest pain, shortness of breath, headache or dizziness. Off BiPAP and on high flow oxygen. Objective: Vitals:   04/01/16 0900 04/01/16 1000 04/01/16 1100 04/01/16 1200  BP: 116/70 118/84 116/68 118/75  Pulse:  85 78 76  Resp: 19 (!) 22 17 19   Temp:      TempSrc:      SpO2:  90% (!) 88% 90%  Weight:      Height:        Intake/Output Summary (Last 24 hours) at 04/01/16 1359 Last data filed at 04/01/16 1210  Gross per 24 hour  Intake  960 ml  Output             4500 ml  Net            -3540 ml   Filed Weights   03/30/16 0500 03/31/16 0500 04/01/16 0500  Weight: (!) 140.5 kg (309 lb 11.9 oz) (!) 137.6 kg (303 lb 5.7 oz) (!) 136.7  kg (301 lb 5.9 oz)    Examination:  General exam: Pleasant male lying on bed, looks comfortable, on high flow oxygen  Respiratory system: Bibasal decreased breath sound, no wheezing appreciated Cardiovascular system: S1 and S2 heard regular, no murmur appreciated Gastrointestinal system: Abdomen is nondistended, soft and nontender. Normal bowel sounds heard. Central nervous system: Alert awake, following commands. Extremities: Symmetric 5 x 5 power. Skin: No rashes, lesions or ulcers Psychiatry: Judgement and insight appear good.     Data Reviewed: I have personally reviewed following labs and imaging studies  CBC:  Recent Labs Lab 03/28/16 1101 03/28/16 1106 03/30/16 0402 03/31/16 0427  WBC 8.6  --  14.9* 14.1*  NEUTROABS 6.7  --   --   --   HGB 14.2 15.3 13.5 13.5  HCT 44.9 45.0 42.0 41.9  MCV 97.8  --  94.8 93.3  PLT 287  --  277 [id number]   Basic Metabolic Panel:  Recent Labs Lab 03/28/16 1101 03/28/16 1106 03/29/16 0453 03/29/16 1146 03/29/16 1651 03/30/16 0402 03/31/16 0427 04/01/16 0447  NA 138 139 135  --   --  137 136 138  K 4.9 4.9 5.4*  --   --  4.8 4.7 4.9  CL 99* 100* 98*  --   --  96* 93* 93*  CO2 33*  --  29  --   --  34* 37* 39*  GLUCOSE 182* 171* 356* 493* 446* 170* 239* 136*  BUN 30* 32* 32*  --   --  37* 44* 39*  CREATININE 0.84 0.90 0.90  --   --  0.88 1.16 0.97  CALCIUM 8.8*  --  8.5*  --   --  8.9 8.9 9.3  MG  --   --   --   --   --   --  1.4* 1.7   GFR: Estimated Creatinine Clearance: 108.7 mL/min (by C-G formula based on SCr of 0.97 mg/dL). Liver Function Tests:  Recent Labs Lab 03/28/16 1101  AST 23  ALT 21  ALKPHOS 98  BILITOT 0.6  PROT 7.1  ALBUMIN 2.9*   No results for input(s): LIPASE, AMYLASE in the last 168 hours. No results for input(s): AMMONIA in the last 168 hours. Coagulation Profile:  Recent Labs Lab 03/28/16 1101 03/29/16 0453 03/30/16 0402 03/31/16 0427 04/01/16 0447  INR 2.63 3.85 4.19* 2.73 1.91    Cardiac Enzymes:  Recent Labs Lab 03/28/16 1813 03/28/16 2332 03/29/16 0453  TROPONINI 0.03* <0.03 0.03*   BNP (last 3 results) No results for input(s): PROBNP in the last 8760 hours. HbA1C: No results for input(s): HGBA1C in the last 72 hours. CBG:  Recent Labs Lab 03/31/16 1121 03/31/16 1637 03/31/16 2113 04/01/16 0735 04/01/16 1112  GLUCAP 360* 289* 306* 118* 247*   Lipid Profile: No results for input(s): CHOL, HDL, LDLCALC, TRIG, CHOLHDL, LDLDIRECT in the last 72 hours. Thyroid Function Tests: No results for input(s): TSH, T4TOTAL, FREET4, T3FREE, THYROIDAB in the last 72 hours. Anemia Panel: No results for input(s): VITAMINB12, FOLATE, FERRITIN, TIBC, IRON, RETICCTPCT in the last 72 hours. Sepsis Labs:  Recent Labs Lab 03/28/16 1131  03/28/16 1452  LATICACIDVEN 0.87 1.4    Recent Results (from the past 240 hour(s))  Blood Culture (routine x 2)     Status: None (Preliminary result)   Collection Time: 03/28/16 11:01 AM  Result Value Ref Range Status   Specimen Description BLOOD DRAWN BY IV THERAPY  Final   Special Requests BOTTLES DRAWN AEROBIC AND ANAEROBIC 6 CC EACH  Final   Culture NO GROWTH 4 DAYS  Final   Report Status PENDING  Incomplete  Blood Culture (routine x 2)     Status: None (Preliminary result)   Collection Time: 03/28/16 11:17 AM  Result Value Ref Range Status   Specimen Description BLOOD RIGHT WRIST  Final   Special Requests BOTTLES DRAWN AEROBIC AND ANAEROBIC 5 CC EACH  Final   Culture NO GROWTH 4 DAYS  Final   Report Status PENDING  Incomplete  Urine culture     Status: None   Collection Time: 03/28/16 11:29 AM  Result Value Ref Range Status   Specimen Description URINE, CATHETERIZED  Final   Special Requests NONE  Final   Culture NO GROWTH Performed at [facility]   Final   Report Status 03/29/2016 FINAL  Final  MRSA PCR Screening     Status: None   Collection Time: 03/28/16  4:12 PM  Result Value Ref Range Status    MRSA by PCR NEGATIVE NEGATIVE Final    Comment:        The GeneXpert MRSA Assay (FDA approved for NASAL specimens only), is one component of a comprehensive MRSA colonization surveillance program. It is not intended to diagnose MRSA infection nor to guide or monitor treatment for MRSA infections.          Radiology Studies: Dg Chest 1 View  Result Date: 03/31/2016 CLINICAL DATA:  Hypoxia, asthma, history lung nodules EXAM: CHEST 1 VIEW COMPARISON:  03/31/2016, 03/28/2016, 06/07/2015 FINDINGS: The heart size and mediastinal contours are within normal limits. Both lungs are clear. The visualized skeletal structures are unremarkable. IMPRESSION: No active disease. Electronically Signed   By: Kathreen Devoid   On: 03/31/2016 15:43   Dg Chest 1 View  Result Date: 03/31/2016 CLINICAL DATA:  Shortness of Breath EXAM: CHEST 1 VIEW COMPARISON:  03/28/2016 FINDINGS: Interstitial opacities have improved since prior study. Probable small layering effusions with bibasilar atelectasis. Heart is borderline in size. IMPRESSION: Improving interstitial edema. Bibasilar atelectasis and small effusions. Electronically Signed   By: Rolm Baptise M.D.   On: 03/31/2016 08:26        Scheduled Meds: . furosemide  40 mg Intravenous Daily  . insulin aspart  0-12 Units Subcutaneous QHS  . insulin aspart  0-20 Units Subcutaneous TID WC  . insulin aspart  10 Units Subcutaneous TID WC  . insulin glargine  45 Units Subcutaneous Daily  . ipratropium-albuterol  3 mL Nebulization TID  . levofloxacin (LEVAQUIN) IV  750 mg Intravenous Q24H  . mouth rinse  15 mL Mouth Rinse BID  . metoprolol  100 mg Oral BID  . predniSONE  50 mg Oral Q breakfast  . simvastatin  40 mg Oral q1800  . sodium chloride flush  3 mL Intravenous Q12H  . warfarin  5 mg Oral Once  . Warfarin - Pharmacist Dosing Inpatient   Does not apply Q24H   Continuous Infusions:   LOS: 4 days    Solstice Lastinger Tanna Furry, MD [facility] Pager [phone]  If 7PM-7AM, please contact night-coverage [email or web address] Password [email or web address] 04/01/2016, 1:59 PM

## 2016-04-01 NOTE — Care Management Important Message (Signed)
Important Message  Patient Details  Name: Justin Ruiz MRN: [id number] Date of Birth: 08/10/1950   Medicare Important Message Given:  Yes    Sherald Barge, RN 04/01/2016, 11:16 AM

## 2016-04-02 LAB — GLUCOSE, CAPILLARY
GLUCOSE-CAPILLARY: 101 mg/dL — AB (ref 65–99)
GLUCOSE-CAPILLARY: 363 mg/dL — AB (ref 65–99)
GLUCOSE-CAPILLARY: 300 mg/dL — AB (ref 65–99)
Glucose-Capillary: 239 mg/dL — ABNORMAL HIGH (ref 65–99)

## 2016-04-02 LAB — CULTURE, BLOOD (ROUTINE X 2)
CULTURE: NO GROWTH
CULTURE: NO GROWTH

## 2016-04-02 LAB — BASIC METABOLIC PANEL
ANION GAP: 7 (ref 5–15)
BUN: 33 mg/dL — ABNORMAL HIGH (ref 6–20)
CO2: 37 mmol/L — AB (ref 22–32)
Calcium: 8.9 mg/dL (ref 8.9–10.3)
Chloride: 92 mmol/L — ABNORMAL LOW (ref 101–111)
Creatinine, Ser: 0.91 mg/dL (ref 0.61–1.24)
GFR calc Af Amer: >60 mL/min (ref >60)
GLUCOSE: 96 mg/dL (ref 65–99)
POTASSIUM: 3.8 mmol/L (ref 3.5–5.1)
Sodium: 136 mmol/L (ref 135–145)

## 2016-04-02 LAB — PROTIME-INR
INR: 1.98
Prothrombin Time: 22.8 seconds — ABNORMAL HIGH (ref 11.4–15.2)

## 2016-04-02 LAB — MAGNESIUM: MAGNESIUM: 1.4 mg/dL — AB (ref 1.7–2.4)

## 2016-04-02 MED ORDER — PREDNISONE 20 MG PO TABS
40.0000 mg | ORAL_TABLET | Freq: Every day | ORAL | Status: DC
  Administered 2016-04-03: 40 mg via ORAL
  Filled 2016-04-02: qty 2

## 2016-04-02 MED ORDER — FUROSEMIDE 40 MG PO TABS
40.0000 mg | ORAL_TABLET | Freq: Every day | ORAL | Status: DC
  Administered 2016-04-03 – 2016-04-04 (×2): 40 mg via ORAL
  Filled 2016-04-02 (×2): qty 1

## 2016-04-02 MED ORDER — MAGNESIUM SULFATE 2 GM/50ML IV SOLN
2.0000 g | Freq: Once | INTRAVENOUS | Status: AC
  Administered 2016-04-02: 2 g via INTRAVENOUS
  Filled 2016-04-02: qty 50

## 2016-04-02 MED ORDER — WARFARIN SODIUM 5 MG PO TABS
5.0000 mg | ORAL_TABLET | Freq: Once | ORAL | Status: AC
  Administered 2016-04-02: 5 mg via ORAL
  Filled 2016-04-02: qty 1

## 2016-04-02 MED ORDER — LEVOFLOXACIN 750 MG PO TABS
750.0000 mg | ORAL_TABLET | Freq: Every day | ORAL | Status: AC
  Administered 2016-04-02 – 2016-04-03 (×2): 750 mg via ORAL
  Filled 2016-04-02 (×2): qty 1

## 2016-04-02 NOTE — Progress Notes (Signed)
ANTICOAGULATION   Pharmacy Consult for Coumadin (chronic Rx PTA)  Indication: Afib  Allergies  Allergen Reactions  . Avapro [Irbesartan] Other (See Comments)    "doesnt sit right with me"--light headed  . Lipitor [Atorvastatin Calcium] Other (See Comments)    Leg pain  . Penicillins     Unknown reaction   Patient Measurements: Height: 6' (182.9 cm) Weight: (!) 313 lb 11.4 oz (142.3 kg) IBW/kg (Calculated) : 77.6  Vital Signs: Temp: 97.9 F (36.6 C) (12/09 0738) Temp Source: Oral (12/09 0738) BP: 117/88 (12/09 0700) Pulse Rate: 87 (12/09 0738)  Labs:  Recent Labs  03/31/16 0427 04/01/16 0447 04/02/16 0411  HGB 13.5  --   --   HCT 41.9  --   --   PLT 269  --   --   LABPROT 29.5* 22.1* 22.8*  INR 2.73 1.91 1.98  CREATININE 1.16 0.97 0.91   Estimated Creatinine Clearance: 118.5 mL/min (by C-G formula based on SCr of 0.91 mg/dL).  Medical History: Past Medical History:  Diagnosis Date  . Arthritis   . Asthma   . BPH (benign prostatic hypertrophy)   . Colon polyps   . COPD (chronic obstructive pulmonary disease) ([place])   . Diabetes mellitus   . GERD (gastroesophageal reflux disease)   . History of oxygen administration    occ. oxygen use at 2 l/m as needed- not using at this time  . HTN (hypertension)    x 3 years  . Hyperlipidemia   . Lung nodules 03/11/2013  . Mediastinal adenopathy 06/20/2013   CT  & PET 2/15   . 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 NHL (non-Hodgkin's lymphoma) ([place])    nhl dx 9/11  . Obesity    exogenous  . Shingles   . Sleep apnea    recent study 04-07-14 awaiting results, no cpap yet.-Dr. Glynn Octave   Medications:  Prescriptions Prior to Admission  Medication Sig Dispense Refill Last Dose  . acetaminophen (TYLENOL) 500 MG tablet Take 1,000 mg by mouth every 6 (six) hours as needed for headache. Reported on 10/08/2015   unknown  . ALPRAZolam (XANAX) 0.25 MG tablet TAKE ONE TABLET BY MOUTH AT BEDTIME 30  tablet 2 unknown  . Cholecalciferol (VITAMIN D3) 5000 UNITS TABS Take 1 tablet by mouth every morning.   unknown  . enalapril (VASOTEC) 20 MG tablet TAKE ONE TABLET BY MOUTH ONCE DAILY (Patient taking differently: TAKE ONE TABLET BY MOUTH TWICE DAILY) 30 tablet 5 unknown at Unknown time  . guaiFENesin (MUCINEX) 600 MG 12 hr tablet Take 600 mg by mouth 2 (two) times daily as needed for cough or to loosen phlegm.   Past Week at Unknown time  . insulin NPH Human (HUMULIN N,NOVOLIN N) 100 UNIT/ML injection Inject 35 Units into the skin daily before breakfast.    unknown  . insulin regular (NOVOLIN R,HUMULIN R) 250 units/2.27m (100 units/mL) injection Inject 10 Units into the skin 2 (two) times daily before a meal. States he use regular insulin needle and he only use this insulin only as needed as back up if Novolin R does not work     . ipratropium-albuterol (DUONEB) 0.5-2.5 (3) MG/3ML SOLN PLACE 1 VIAL (3 MLS) IN THE NEBULIZER EVERY 6 HOURS AS NEEDED 1080 mL 0 Past Week at Unknown time  . metFORMIN (GLUCOPHAGE) 500 MG tablet TAKE ONE TABLET BY MOUTH IN THE MORNING AND TAKE  TWO TABLETS IN THE EVENING 270 tablet 0 unknown  . metoprolol (  LOPRESSOR) 100 MG tablet TAKE ONE TABLET BY MOUTH TWICE DAILY 180 tablet 1 unknown  . NOVOLIN R RELION 100 UNIT/ML injection INJECT 32 UNITS SUBCUTANEOUSLY TWICE A DAY BEFORE A MEAL 20 mL 2 unknown  . simvastatin (ZOCOR) 40 MG tablet TAKE ONE TABLET BY MOUTH ONCE DAILY 90 tablet 0 unknown  . warfarin (COUMADIN) 5 MG tablet TAKE ONE & ONE-HALF TABLETS BY MOUTH ONCE DAILY EVERY MONDAY AND EVERY FRIDAY THEN TAKE ONE  TABLET THE REST OF THE WEEK 140 tablet 2 unknown  . albuterol (PROAIR HFA) 108 (90 BASE) MCG/ACT inhaler Inhale 2 puffs into the lungs every 6 (six) hours as needed for wheezing or shortness of breath. 8.5 g 5 Taking  . Blood Glucose Calibration (TAI DOC CONTROL) NORMAL SOLN Use as directed.   Taking  . Blood Glucose Monitoring Suppl (CLEVER CHEK AUTO-CODE VOICE)  DEVI USE AS DIRECTED 1 each 2 Taking  . Lancet Devices (LANCING DEVICE) MISC USE AS DIRECTED 1 each 2 Taking   Assessment: 65yo male presented to ED unresponsive and with low blood sugar.  Asked to resume home Warfarin. INR 1.98  Noted interaction with levaquin Goal of Therapy:  INR 2-3 Monitor platelets by anticoagulation protocol: Yes  Plan:  Coumadin  34m x 1 today PT/INR daily Monitor for S/S of bleeding  LExcell SeltzerPharmD Clinical Pharmacist 04/02/2016

## 2016-04-02 NOTE — Progress Notes (Signed)
PROGRESS NOTE    Justin Ruiz  [id number] DOB: [birth year] DOA: 03/28/2016 PCP: Redge Gainer, MD   Brief Narrative: 65 y/o male with multiple medical problems who was brought to ED when he was found unresponsive and hypoglycemic. He has had cough and cold symptoms for the past week. He was found to have respiratory failure due to pneumonia/CHF. He was started on bipap for hypercapnea and has since improved.  He is being diuresed for CHF and on abx for pneumonia.   Assessment & Plan:   # Acute on chronic hypoxic/hypercapnic respiratory failure. Likely related to congestive heart failure and pneumonia. The patient has underlying COPD.  -Clinically improving. Repeat x-ray clear. Try to wean off oxygen gradually, on 4.5 L of oxygen. - He uses 2 L of altered home.  # Acute on chronic congestive heart failure, likely diastolic. Repeat echocardiogram with left ventricular ejection fraction of 50%. Indeterminate diastolic function due to the afib.  -Patient is negative by 13.5 L since admission. Change Lasix to oral. Continue to monitor electrolytes and urine output. -Continue metoprolol.  - Monitor intake and output.  # Atrial fibrillation with rapid ventricular response. s/p Cardizem drip. Currently on metoprolol 100 mg twice a day. Heart rate better controlled. Continue telemetry monitoring. He is on Coumadin for systemic anticoagulation. Monitor INR.   # Community acquired pneumonia. Presented with shortness of breath and x-ray consistent with possible pneumonia. Change levaquin to oral. Cultures negative. Likely discontinue antibiotics after tomorrow's dose after completing total 7 days course.  # Acute metabolic encephalopathy. Likely related to hypoglycemia as well as elevated PCO2 on admission. CT head was negative. Continue to monitor. Mental status improved.  # Uncontrolled insulin-dependent diabetes with hyperglycemia: Continue current insulin regimen. Blood sugar level better  controlled now. Monitor blood sugar level. May need to lower the dose of Lantus depending on blood sugar level. -Hba1c 5.9 only. He was admitted with hypoglycemia on admission.  # COPD. Stable. Continue on bronchodilators, antibiotics, prednisone. We will lower the dose of prednisone to 40 today.  # Hypertension. Blood pressure stable. Continue Lasix and metoprolol.  #Hypomagnesemia likely due to diuretics: Repleted magnesium sulfate repeat labs.  # Diffuse large B-cell lymphoma. Follow-up with oncology.   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 Looks more comfortable and alert today. Denied headache, chest pain, shortness of breath. No new event. Objective: Vitals:   04/02/16 0700 04/02/16 0715 04/02/16 0738 04/02/16 1048  BP: 117/88     Pulse: 84  87   Resp: 14     Temp:   97.9 F (36.6 C) 97.7 F (36.5 C)  TempSrc:   Oral Oral  SpO2: 97% 95% 94%   Weight:      Height:        Intake/Output Summary (Last 24 hours)  at 04/02/16 1119 Last data filed at 04/02/16 1000  Gross per 24 hour  Intake             1260 ml  Output             3900 ml  Net            -2640 ml   Filed Weights   04/01/16 0500 04/02/16 0400 04/02/16 0500    Weight: (!) 136.7 kg (301 lb 5.9 oz) (!) 136.5 kg (300 lb 14.9 oz) (!) 142.3 kg (313 lb 11.4 oz)    Examination:  General exam: Pleasant male lying on bed, looks more alert and comfortable. Respiratory system: Clear bilaterally, no wheezing or crackles appreciated Cardiovascular system: Regular rate rhythm, S1-S2 normal. No pedal edema. Gastrointestinal system: Abdomen is nondistended, soft and nontender. Normal bowel sounds heard. Central nervous system: Alert awake, following commands. Extremities: Symmetric 5 x 5 power. Skin: No rashes, lesions or ulcers Psychiatry: Judgement and insight appear good.     Data Reviewed: I have personally reviewed following labs and imaging studies  CBC:  Recent Labs Lab 03/28/16 1101 03/28/16 1106 03/30/16 0402 03/31/16 0427  WBC 8.6  --  14.9* 14.1*  NEUTROABS 6.7  --   --   --   HGB 14.2 15.3 13.5 13.5  HCT 44.9 45.0 42.0 41.9  MCV 97.8  --  94.8 93.3  PLT 287  --  277 [id number]   Basic Metabolic Panel:  Recent Labs Lab 03/29/16 0453  03/29/16 1651 03/30/16 0402 03/31/16 0427 04/01/16 0447 04/02/16 0411  NA 135  --   --  137 136 138 136  K 5.4*  --   --  4.8 4.7 4.9 3.8  CL 98*  --   --  96* 93* 93* 92*  CO2 29  --   --  34* 37* 39* 37*  GLUCOSE 356*  < > 446* 170* 239* 136* 96  BUN 32*  --   --  37* 44* 39* 33*  CREATININE 0.90  --   --  0.88 1.16 0.97 0.91  CALCIUM 8.5*  --   --  8.9 8.9 9.3 8.9  MG  --   --   --   --  1.4* 1.7 1.4*  < > = values in this interval not displayed. GFR: Estimated Creatinine Clearance: 118.5 mL/min (by C-G formula based on SCr of 0.91 mg/dL). Liver Function Tests:  Recent Labs Lab 03/28/16 1101  AST 23  ALT 21  ALKPHOS 98  BILITOT 0.6  PROT 7.1  ALBUMIN 2.9*   No results for input(s): LIPASE, AMYLASE in the last 168 hours. No results for input(s): AMMONIA in the last 168 hours. Coagulation Profile:  Recent Labs Lab 03/29/16 0453 03/30/16 0402 03/31/16 0427 04/01/16 0447  04/02/16 0411  INR 3.85 4.19* 2.73 1.91 1.98   Cardiac Enzymes:  Recent Labs Lab 03/28/16 1813 03/28/16 2332 03/29/16 0453  TROPONINI 0.03* <0.03 0.03*   BNP (last 3 results) No results for input(s): PROBNP in the last 8760 hours. HbA1C: No results for input(s): HGBA1C in the last 72 hours. CBG:  Recent Labs Lab 04/01/16 1112 04/01/16 1612 04/01/16 2254 04/02/16 0737 04/02/16 1050  GLUCAP 247* 186* 95 101* 239*   Lipid Profile: No results for input(s): CHOL, HDL, LDLCALC, TRIG, CHOLHDL, LDLDIRECT in the last 72 hours. Thyroid Function Tests: No results for input(s): TSH, T4TOTAL, FREET4, T3FREE, THYROIDAB in the last 72 hours. Anemia Panel: No results for input(s): VITAMINB12, FOLATE,  FERRITIN, TIBC, IRON, RETICCTPCT in the last 72 hours. Sepsis Labs:  Recent Labs Lab 03/28/16 1131 03/28/16 1452  LATICACIDVEN 0.87 1.4    Recent Results (from the past 240 hour(s))  Blood Culture (routine x 2)     Status: None   Collection Time: 03/28/16 11:01 AM  Result Value Ref Range Status   Specimen Description BLOOD DRAWN BY IV THERAPY  Final   Special Requests BOTTLES DRAWN AEROBIC AND ANAEROBIC 6 CC EACH  Final   Culture NO GROWTH 5 DAYS  Final   Report Status 04/02/2016 FINAL  Final  Blood Culture (routine x 2)     Status: None   Collection Time: 03/28/16 11:17 AM  Result Value Ref Range Status   Specimen Description BLOOD RIGHT WRIST  Final   Special Requests BOTTLES DRAWN AEROBIC AND ANAEROBIC 5 CC EACH  Final   Culture NO GROWTH 5 DAYS  Final   Report Status 04/02/2016 FINAL  Final  Urine culture     Status: None   Collection Time: 03/28/16 11:29 AM  Result Value Ref Range Status   Specimen Description URINE, CATHETERIZED  Final   Special Requests NONE  Final   Culture NO GROWTH Performed at [facility]   Final   Report Status 03/29/2016 FINAL  Final  MRSA PCR Screening     Status: None   Collection Time: 03/28/16  4:12 PM  Result Value Ref Range  Status   MRSA by PCR NEGATIVE NEGATIVE Final    Comment:        The GeneXpert MRSA Assay (FDA approved for NASAL specimens only), is one component of a comprehensive MRSA colonization surveillance program. It is not intended to diagnose MRSA infection nor to guide or monitor treatment for MRSA infections.   MRSA PCR Screening     Status: None   Collection Time: 04/01/16  9:40 AM  Result Value Ref Range Status   MRSA by PCR NEGATIVE NEGATIVE Final    Comment:        The GeneXpert MRSA Assay (FDA approved for NASAL specimens only), is one component of a comprehensive MRSA colonization surveillance program. It is not intended to diagnose MRSA infection nor to guide or monitor treatment for MRSA infections.          Radiology Studies: Dg Chest 1 View  Result Date: 03/31/2016 CLINICAL DATA:  Hypoxia, asthma, history lung nodules EXAM: CHEST 1 VIEW COMPARISON:  03/31/2016, 03/28/2016, 06/07/2015 FINDINGS: The heart size and mediastinal contours are within normal limits. Both lungs are clear. The visualized skeletal structures are unremarkable. IMPRESSION: No active disease. Electronically Signed   By: Kathreen Devoid   On: 03/31/2016 15:43        Scheduled Meds: . Derrill Memo ON 04/03/2016] furosemide  40 mg Oral Daily  . insulin aspart  0-12 Units Subcutaneous QHS  . insulin aspart  0-20 Units Subcutaneous TID WC  . insulin aspart  10 Units Subcutaneous TID WC  . insulin glargine  45 Units Subcutaneous Daily  . ipratropium-albuterol  3 mL Nebulization TID  . levofloxacin (LEVAQUIN) IV  750 mg Intravenous Q24H  . magnesium sulfate 1 - 4 g bolus IVPB  2 g Intravenous Once  . mouth rinse  15 mL Mouth Rinse BID  . metoprolol  100 mg Oral BID  . predniSONE  50 mg Oral Q breakfast  . simvastatin  40 mg Oral q1800  . sodium chloride flush  3 mL Intravenous Q12H  . warfarin  5 mg  Oral Once  . Warfarin - Pharmacist Dosing Inpatient   Does not apply Q24H   Continuous  Infusions:   LOS: 5 days    Ileah Falkenstein Tanna Furry, MD [facility] Pager [phone]  If 7PM-7AM, please contact night-coverage [email or web address] Password [email or web address] 04/02/2016, 11:19 AM

## 2016-04-02 NOTE — Progress Notes (Signed)
Subjective: He says he feels much better. Much less short of breath. No new complaints. He denies chest pain nausea vomiting or diarrhea. He has sleep apnea and used his CPAP again last night. He apparently is at times noncompliant with CPAP at home per chart notes  Objective: Vital signs in last 24 hours: Temp:  [97.8 F (36.6 C)-98.5 F (36.9 C)] 97.9 F (36.6 C) (12/09 0738) Pulse Rate:  [75-87] 87 (12/09 0738) Resp:  [14-22] 14 (12/09 0700) BP: (112-124)/(65-106) 117/88 (12/09 0700) SpO2:  [87 %-98 %] 94 % (12/09 0738) Weight:  [136.5 kg (300 lb 14.9 oz)-142.3 kg (313 lb 11.4 oz)] 142.3 kg (313 lb 11.4 oz) (12/09 0500) Weight change: -0.2 kg (-7.1 oz) Last BM Date: 03/31/16  Intake/Output from previous day: 12/08 0701 - 12/09 0700 In: 780 [P.O.:780] Out: 3900 [Urine:3900]  PHYSICAL EXAM General appearance: alert, cooperative and no distress Resp: clear to auscultation bilaterally Cardio: Heart shows atrial fib with good ventricular response controlled. GI: soft, non-tender; bowel sounds normal; no masses,  no organomegaly Extremities: extremities normal, atraumatic, no cyanosis or edema Skin warm and dry. Mucous membranes are moist. Pupils react  Lab Results:  Results for orders placed or performed during the hospital encounter of 03/28/16 (from the past 48 hour(s))  Glucose, capillary     Status: Abnormal   Collection Time: 03/31/16 11:21 AM  Result Value Ref Range   Glucose-Capillary 360 (H) 65 - 99 mg/dL   Comment 1 Notify RN    Comment 2 Document in Chart   Blood gas, arterial     Status: Abnormal   Collection Time: 03/31/16  3:40 PM  Result Value Ref Range   O2 Content 15.0 L/min   Delivery systems NASAL CANNULA    pH, Arterial 7.434 7.350 - 7.450   pCO2 arterial 53.1 (H) 32.0 - 48.0 mmHg   pO2, Arterial 66.9 (L) 83.0 - 108.0 mmHg   Bicarbonate 32.7 (H) 20.0 - 28.0 mmol/L   Acid-Base Excess 10.3 (H) 0.0 - 2.0 mmol/L   O2 Saturation 91.5 %   Patient  temperature 37.0    Collection site LEFT RADIAL    Drawn by [id number]    Sample type ARTERIAL DRAW    Allens test (pass/fail) PASS PASS  Glucose, capillary     Status: Abnormal   Collection Time: 03/31/16  4:37 PM  Result Value Ref Range   Glucose-Capillary 289 (H) 65 - 99 mg/dL   Comment 1 Notify RN    Comment 2 Document in Chart   Glucose, capillary     Status: Abnormal   Collection Time: 03/31/16  9:13 PM  Result Value Ref Range   Glucose-Capillary 306 (H) 65 - 99 mg/dL  Basic metabolic panel     Status: Abnormal   Collection Time: 04/01/16  4:47 AM  Result Value Ref Range   Sodium 138 135 - 145 mmol/L   Potassium 4.9 3.5 - 5.1 mmol/L   Chloride 93 (L) 101 - 111 mmol/L   CO2 39 (H) 22 - 32 mmol/L   Glucose, Bld 136 (H) 65 - 99 mg/dL   BUN 39 (H) 6 - 20 mg/dL   Creatinine, Ser 0.97 0.61 - 1.24 mg/dL   Calcium 9.3 8.9 - 10.3 mg/dL   GFR calc non Af Amer >60 >60 mL/min   GFR calc Af Amer >60 >60 mL/min    Comment: (NOTE) The eGFR has been calculated using the CKD EPI equation. This calculation has not been validated in all clinical  situations. eGFR's persistently <60 mL/min signify possible Chronic Kidney Disease.    Anion gap 6 5 - 15  Protime-INR     Status: Abnormal   Collection Time: 04/01/16  4:47 AM  Result Value Ref Range   Prothrombin Time 22.1 (H) 11.4 - 15.2 seconds   INR 1.91   Magnesium     Status: None   Collection Time: 04/01/16  4:47 AM  Result Value Ref Range   Magnesium 1.7 1.7 - 2.4 mg/dL  Glucose, capillary     Status: Abnormal   Collection Time: 04/01/16  7:35 AM  Result Value Ref Range   Glucose-Capillary 118 (H) 65 - 99 mg/dL   Comment 1 Notify RN   MRSA PCR Screening     Status: None   Collection Time: 04/01/16  9:40 AM  Result Value Ref Range   MRSA by PCR NEGATIVE NEGATIVE    Comment:        The GeneXpert MRSA Assay (FDA approved for NASAL specimens only), is one component of a comprehensive MRSA colonization surveillance program. It is  not intended to diagnose MRSA infection nor to guide or monitor treatment for MRSA infections.   Glucose, capillary     Status: Abnormal   Collection Time: 04/01/16 11:12 AM  Result Value Ref Range   Glucose-Capillary 247 (H) 65 - 99 mg/dL   Comment 1 Notify RN   Glucose, capillary     Status: Abnormal   Collection Time: 04/01/16  4:12 PM  Result Value Ref Range   Glucose-Capillary 186 (H) 65 - 99 mg/dL  Glucose, capillary     Status: None   Collection Time: 04/01/16 10:54 PM  Result Value Ref Range   Glucose-Capillary 95 65 - 99 mg/dL   Comment 1 Notify RN    Comment 2 Document in Chart   Basic metabolic panel     Status: Abnormal   Collection Time: 04/02/16  4:11 AM  Result Value Ref Range   Sodium 136 135 - 145 mmol/L   Potassium 3.8 3.5 - 5.1 mmol/L    Comment: DELTA CHECK NOTED   Chloride 92 (L) 101 - 111 mmol/L   CO2 37 (H) 22 - 32 mmol/L   Glucose, Bld 96 65 - 99 mg/dL   BUN 33 (H) 6 - 20 mg/dL   Creatinine, Ser 0.91 0.61 - 1.24 mg/dL   Calcium 8.9 8.9 - 10.3 mg/dL   GFR calc non Af Amer >60 >60 mL/min   GFR calc Af Amer >60 >60 mL/min    Comment: (NOTE) The eGFR has been calculated using the CKD EPI equation. This calculation has not been validated in all clinical situations. eGFR's persistently <60 mL/min signify possible Chronic Kidney Disease.    Anion gap 7 5 - 15  Protime-INR     Status: Abnormal   Collection Time: 04/02/16  4:11 AM  Result Value Ref Range   Prothrombin Time 22.8 (H) 11.4 - 15.2 seconds   INR 1.98   Glucose, capillary     Status: Abnormal   Collection Time: 04/02/16  7:37 AM  Result Value Ref Range   Glucose-Capillary 101 (H) 65 - 99 mg/dL    ABGS  Recent Labs  03/31/16 1540  PHART 7.434  PO2ART 66.9*  HCO3 32.7*   CULTURES Recent Results (from the past 240 hour(s))  Blood Culture (routine x 2)     Status: None   Collection Time: 03/28/16 11:01 AM  Result Value Ref Range Status   Specimen Description  BLOOD DRAWN BY IV  THERAPY  Final   Special Requests BOTTLES DRAWN AEROBIC AND ANAEROBIC 6 CC EACH  Final   Culture NO GROWTH 5 DAYS  Final   Report Status 04/02/2016 FINAL  Final  Blood Culture (routine x 2)     Status: None   Collection Time: 03/28/16 11:17 AM  Result Value Ref Range Status   Specimen Description BLOOD RIGHT WRIST  Final   Special Requests BOTTLES DRAWN AEROBIC AND ANAEROBIC 5 CC EACH  Final   Culture NO GROWTH 5 DAYS  Final   Report Status 04/02/2016 FINAL  Final  Urine culture     Status: None   Collection Time: 03/28/16 11:29 AM  Result Value Ref Range Status   Specimen Description URINE, CATHETERIZED  Final   Special Requests NONE  Final   Culture NO GROWTH Performed at [facility]   Final   Report Status 03/29/2016 FINAL  Final  MRSA PCR Screening     Status: None   Collection Time: 03/28/16  4:12 PM  Result Value Ref Range Status   MRSA by PCR NEGATIVE NEGATIVE Final    Comment:        The GeneXpert MRSA Assay (FDA approved for NASAL specimens only), is one component of a comprehensive MRSA colonization surveillance program. It is not intended to diagnose MRSA infection nor to guide or monitor treatment for MRSA infections.   MRSA PCR Screening     Status: None   Collection Time: 04/01/16  9:40 AM  Result Value Ref Range Status   MRSA by PCR NEGATIVE NEGATIVE Final    Comment:        The GeneXpert MRSA Assay (FDA approved for NASAL specimens only), is one component of a comprehensive MRSA colonization surveillance program. It is not intended to diagnose MRSA infection nor to guide or monitor treatment for MRSA infections.    Studies/Results: Dg Chest 1 View  Result Date: 03/31/2016 CLINICAL DATA:  Hypoxia, asthma, history lung nodules EXAM: CHEST 1 VIEW COMPARISON:  03/31/2016, 03/28/2016, 06/07/2015 FINDINGS: The heart size and mediastinal contours are within normal limits. Both lungs are clear. The visualized skeletal structures are unremarkable.  IMPRESSION: No active disease. Electronically Signed   By: Kathreen Devoid   On: 03/31/2016 15:43    Medications:  Prior to Admission:  Prescriptions Prior to Admission  Medication Sig Dispense Refill Last Dose  . acetaminophen (TYLENOL) 500 MG tablet Take 1,000 mg by mouth every 6 (six) hours as needed for headache. Reported on 10/08/2015   unknown  . ALPRAZolam (XANAX) 0.25 MG tablet TAKE ONE TABLET BY MOUTH AT BEDTIME 30 tablet 2 unknown  . Cholecalciferol (VITAMIN D3) 5000 UNITS TABS Take 1 tablet by mouth every morning.   unknown  . enalapril (VASOTEC) 20 MG tablet TAKE ONE TABLET BY MOUTH ONCE DAILY (Patient taking differently: TAKE ONE TABLET BY MOUTH TWICE DAILY) 30 tablet 5 unknown at Unknown time  . guaiFENesin (MUCINEX) 600 MG 12 hr tablet Take 600 mg by mouth 2 (two) times daily as needed for cough or to loosen phlegm.   Past Week at Unknown time  . insulin NPH Human (HUMULIN N,NOVOLIN N) 100 UNIT/ML injection Inject 35 Units into the skin daily before breakfast.    unknown  . insulin regular (NOVOLIN R,HUMULIN R) 250 units/2.15m (100 units/mL) injection Inject 10 Units into the skin 2 (two) times daily before a meal. States he use regular insulin needle and he only use this insulin only as  needed as back up if Novolin R does not work     . ipratropium-albuterol (DUONEB) 0.5-2.5 (3) MG/3ML SOLN PLACE 1 VIAL (3 MLS) IN THE NEBULIZER EVERY 6 HOURS AS NEEDED 1080 mL 0 Past Week at Unknown time  . metFORMIN (GLUCOPHAGE) 500 MG tablet TAKE ONE TABLET BY MOUTH IN THE MORNING AND TAKE  TWO TABLETS IN THE EVENING 270 tablet 0 unknown  . metoprolol (LOPRESSOR) 100 MG tablet TAKE ONE TABLET BY MOUTH TWICE DAILY 180 tablet 1 unknown  . NOVOLIN R RELION 100 UNIT/ML injection INJECT 32 UNITS SUBCUTANEOUSLY TWICE A DAY BEFORE A MEAL 20 mL 2 unknown  . simvastatin (ZOCOR) 40 MG tablet TAKE ONE TABLET BY MOUTH ONCE DAILY 90 tablet 0 unknown  . warfarin (COUMADIN) 5 MG tablet TAKE ONE & ONE-HALF TABLETS BY  MOUTH ONCE DAILY EVERY MONDAY AND EVERY FRIDAY THEN TAKE ONE  TABLET THE REST OF THE WEEK 140 tablet 2 unknown  . albuterol (PROAIR HFA) 108 (90 BASE) MCG/ACT inhaler Inhale 2 puffs into the lungs every 6 (six) hours as needed for wheezing or shortness of breath. 8.5 g 5 Taking  . Blood Glucose Calibration (TAI DOC CONTROL) NORMAL SOLN Use as directed.   Taking  . Blood Glucose Monitoring Suppl (CLEVER CHEK AUTO-CODE VOICE) DEVI USE AS DIRECTED 1 each 2 Taking  . Lancet Devices (LANCING DEVICE) MISC USE AS DIRECTED 1 each 2 Taking   Scheduled: . furosemide  40 mg Intravenous Daily  . insulin aspart  0-12 Units Subcutaneous QHS  . insulin aspart  0-20 Units Subcutaneous TID WC  . insulin aspart  10 Units Subcutaneous TID WC  . insulin glargine  45 Units Subcutaneous Daily  . ipratropium-albuterol  3 mL Nebulization TID  . levofloxacin (LEVAQUIN) IV  750 mg Intravenous Q24H  . mouth rinse  15 mL Mouth Rinse BID  . metoprolol  100 mg Oral BID  . predniSONE  50 mg Oral Q breakfast  . simvastatin  40 mg Oral q1800  . sodium chloride flush  3 mL Intravenous Q12H  . warfarin  5 mg Oral Once  . Warfarin - Pharmacist Dosing Inpatient   Does not apply Q24H   Continuous:  DUK:GURKYH chloride, acetaminophen, ipratropium-albuterol, ondansetron (ZOFRAN) IV, sodium chloride flush  Assesment: He was admitted with acute on chronic hypoxic/hypercapnic respiratory failure. Initial chest x-ray showed community-acquired pneumonia. He's been treated with Levaquin. He has COPD at baseline with exacerbation and seems to be better with that as well. He has sleep apnea at baseline and has been using CPAP here in the hospital. He had significant encephalopathy on admission and that has improved. His chronic atrial fib is better controlled. On admission he had atrial fib with rapid ventricular response. 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 As previously mentioned I would give him 5 days of Levaquin but since his chest x-ray has cleared I don't think he needs any more than that. He's currently on prednisone.    LOS: 5 days  Crystalee Ventress L 04/02/2016, 8:45 AM

## 2016-04-03 LAB — GLUCOSE, CAPILLARY
GLUCOSE-CAPILLARY: 234 mg/dL — AB (ref 65–99)
GLUCOSE-CAPILLARY: 274 mg/dL — AB (ref 65–99)
Glucose-Capillary: 209 mg/dL — ABNORMAL HIGH (ref 65–99)
Glucose-Capillary: 218 mg/dL — ABNORMAL HIGH (ref 65–99)

## 2016-04-03 LAB — BASIC METABOLIC PANEL
ANION GAP: 9 (ref 5–15)
BUN: 31 mg/dL — ABNORMAL HIGH (ref 6–20)
CHLORIDE: 94 mmol/L — AB (ref 101–111)
CO2: 34 mmol/L — ABNORMAL HIGH (ref 22–32)
Calcium: 8.8 mg/dL — ABNORMAL LOW (ref 8.9–10.3)
Creatinine, Ser: 0.85 mg/dL (ref 0.61–1.24)
Glucose, Bld: 144 mg/dL — ABNORMAL HIGH (ref 65–99)
POTASSIUM: 3.8 mmol/L (ref 3.5–5.1)
SODIUM: 137 mmol/L (ref 135–145)

## 2016-04-03 LAB — MAGNESIUM: MAGNESIUM: 1.7 mg/dL (ref 1.7–2.4)

## 2016-04-03 LAB — PROTIME-INR
INR: 2.1
Prothrombin Time: 23.9 seconds — ABNORMAL HIGH (ref 11.4–15.2)

## 2016-04-03 MED ORDER — HALOPERIDOL LACTATE 5 MG/ML IJ SOLN
1.0000 mg | Freq: Once | INTRAMUSCULAR | Status: AC
  Administered 2016-04-03: 1 mg via INTRAVENOUS
  Filled 2016-04-03: qty 1

## 2016-04-03 MED ORDER — HALOPERIDOL LACTATE 5 MG/ML IJ SOLN: 1.0000 mg | Freq: Four times a day (QID) | INTRAMUSCULAR | Status: DC | PRN

## 2016-04-03 MED ORDER — PREDNISONE 20 MG PO TABS
30.0000 mg | ORAL_TABLET | Freq: Every day | ORAL | Status: DC
  Administered 2016-04-04: 09:00:00 30 mg via ORAL
  Filled 2016-04-03: qty 1

## 2016-04-03 MED ORDER — WARFARIN SODIUM 5 MG PO TABS
5.0000 mg | ORAL_TABLET | Freq: Once | ORAL | Status: AC
  Administered 2016-04-03: 5 mg via ORAL
  Filled 2016-04-03: qty 1

## 2016-04-03 MED ORDER — MAGNESIUM SULFATE 2 GM/50ML IV SOLN
2.0000 g | Freq: Once | INTRAVENOUS | Status: AC
  Administered 2016-04-03: 2 g via INTRAVENOUS
  Filled 2016-04-03: qty 50

## 2016-04-03 NOTE — Progress Notes (Signed)
ANTICOAGULATION   Pharmacy Consult for Coumadin (chronic Rx PTA)  Indication: Afib  Allergies  Allergen Reactions  . Avapro [Irbesartan] Other (See Comments)    "doesnt sit right with me"--light headed  . Lipitor [Atorvastatin Calcium] Other (See Comments)    Leg pain  . Penicillins     Unknown reaction   Patient Measurements: Height: 6' (182.9 cm) Weight: 298 lb 15.1 oz (135.6 kg) IBW/kg (Calculated) : 77.6  Vital Signs: Temp: 98 F (36.7 C) (12/10 0400) Temp Source: Oral (12/10 0400) BP: 127/94 (12/10 0600) Pulse Rate: 87 (12/10 0600)  Labs:  Recent Labs  04/01/16 0447 04/02/16 0411 04/03/16 0421  LABPROT 22.1* 22.8* 23.9*  INR 1.91 1.98 2.10  CREATININE 0.97 0.91 0.85   Estimated Creatinine Clearance: 123.5 mL/min (by C-G formula based on SCr of 0.85 mg/dL).  Medical History: Past Medical History:  Diagnosis Date  . Arthritis   . Asthma   . BPH (benign prostatic hypertrophy)   . Colon polyps   . COPD (chronic obstructive pulmonary disease) ([place])   . Diabetes mellitus   . GERD (gastroesophageal reflux disease)   . History of oxygen administration    occ. oxygen use at 2 l/m as needed- not using at this time  . HTN (hypertension)    x 3 years  . Hyperlipidemia   . Lung nodules 03/11/2013  . Mediastinal adenopathy 06/20/2013   CT  & PET 2/15   . 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 NHL (non-Hodgkin's lymphoma) ([place])    nhl dx 9/11  . Obesity    exogenous  . Shingles   . Sleep apnea    recent study 04-07-14 awaiting results, no cpap yet.-Dr. Glynn Octave   Medications:  Prescriptions Prior to Admission  Medication Sig Dispense Refill Last Dose  . acetaminophen (TYLENOL) 500 MG tablet Take 1,000 mg by mouth every 6 (six) hours as needed for headache. Reported on 10/08/2015   unknown  . ALPRAZolam (XANAX) 0.25 MG tablet TAKE ONE TABLET BY MOUTH AT BEDTIME 30 tablet 2 unknown  . Cholecalciferol (VITAMIN D3) 5000 UNITS TABS Take  1 tablet by mouth every morning.   unknown  . enalapril (VASOTEC) 20 MG tablet TAKE ONE TABLET BY MOUTH ONCE DAILY (Patient taking differently: TAKE ONE TABLET BY MOUTH TWICE DAILY) 30 tablet 5 unknown at Unknown time  . guaiFENesin (MUCINEX) 600 MG 12 hr tablet Take 600 mg by mouth 2 (two) times daily as needed for cough or to loosen phlegm.   Past Week at Unknown time  . insulin NPH Human (HUMULIN N,NOVOLIN N) 100 UNIT/ML injection Inject 35 Units into the skin daily before breakfast.    unknown  . insulin regular (NOVOLIN R,HUMULIN R) 250 units/2.87m (100 units/mL) injection Inject 10 Units into the skin 2 (two) times daily before a meal. States he use regular insulin needle and he only use this insulin only as needed as back up if Novolin R does not work     . ipratropium-albuterol (DUONEB) 0.5-2.5 (3) MG/3ML SOLN PLACE 1 VIAL (3 MLS) IN THE NEBULIZER EVERY 6 HOURS AS NEEDED 1080 mL 0 Past Week at Unknown time  . metFORMIN (GLUCOPHAGE) 500 MG tablet TAKE ONE TABLET BY MOUTH IN THE MORNING AND TAKE  TWO TABLETS IN THE EVENING 270 tablet 0 unknown  . metoprolol (LOPRESSOR) 100 MG tablet TAKE ONE TABLET BY MOUTH TWICE DAILY 180 tablet 1 unknown  . NOVOLIN R RELION 100 UNIT/ML injection INJECT 32 UNITS SUBCUTANEOUSLY TWICE  A DAY BEFORE A MEAL 20 mL 2 unknown  . simvastatin (ZOCOR) 40 MG tablet TAKE ONE TABLET BY MOUTH ONCE DAILY 90 tablet 0 unknown  . warfarin (COUMADIN) 5 MG tablet TAKE ONE & ONE-HALF TABLETS BY MOUTH ONCE DAILY EVERY MONDAY AND EVERY FRIDAY THEN TAKE ONE  TABLET THE REST OF THE WEEK 140 tablet 2 unknown  . albuterol (PROAIR HFA) 108 (90 BASE) MCG/ACT inhaler Inhale 2 puffs into the lungs every 6 (six) hours as needed for wheezing or shortness of breath. 8.5 g 5 Taking  . Blood Glucose Calibration (TAI DOC CONTROL) NORMAL SOLN Use as directed.   Taking  . Blood Glucose Monitoring Suppl (CLEVER CHEK AUTO-CODE VOICE) DEVI USE AS DIRECTED 1 each 2 Taking  . Lancet Devices (LANCING  DEVICE) MISC USE AS DIRECTED 1 each 2 Taking   Assessment: 65yo male presented to ED unresponsive and with low blood sugar.  Asked to resume home Warfarin. INR 2.10 today  Noted interaction with levaquin.  Levaquin to stop after today Goal of Therapy:  INR 2-3 Monitor platelets by anticoagulation protocol: Yes  Plan:  Coumadin  72m x 1 today PT/INR daily Monitor for S/S of bleeding  LExcell SeltzerPharmD Clinical Pharmacist 04/03/2016

## 2016-04-03 NOTE — Progress Notes (Signed)
PROGRESS NOTE    CORTAVIOUS AGREDA  [id number] DOB: April 24, 1951 DOA: 03/28/2016 PCP: Redge Gainer, MD   Brief Narrative: 65 y/o male with multiple medical problems who was brought to ED when he was found unresponsive and hypoglycemic. He has had cough and cold symptoms for the past week. He was found to have respiratory failure due to pneumonia/CHF. He was started on bipap for hypercapnea and has since improved.  He is being diuresed for CHF and on abx for pneumonia.   Assessment & Plan:   # Acute on chronic hypoxic/hypercapnic respiratory failure. Likely related to congestive heart failure and pneumonia. The patient has underlying COPD.  -Clinically improving. Repeat x-ray clear. He was using 4-5 L of oxygen. Try to wean off to 2-3 L today. I discussed with the respirator therapies. - He uses 2 L of altered home.  # Acute on chronic congestive heart failure, likely diastolic. Repeat echocardiogram with left ventricular ejection fraction of 50%. Indeterminate diastolic function due to the afib.  -Patient is negative by 16 L since admission. Currently on oral Lasix. Continue to monitor electrolytes and urine output. -Continue metoprolol.  - Monitor intake and output.  # Atrial fibrillation with rapid ventricular response. s/p Cardizem drip. Currently on metoprolol 100 mg twice a day. Heart rate better controlled. Continue telemetry monitoring. He is on Coumadin for systemic anticoagulation. Monitor INR.   # Community acquired pneumonia. Presented with shortness of breath and x-ray consistent with possible pneumonia. Completing 7 days of Levaquin today.  # Acute metabolic encephalopathy. Likely related to hypoglycemia as well as elevated PCO2 on admission. CT head was negative. Continue to monitor. Mental status improved.  # Uncontrolled insulin-dependent diabetes with hyperglycemia: Blood sugar level fluctuating. Continue current insulin regimen. Avoid hypoglycemic episode. -Hba1c 5.9  only. He was admitted with hypoglycemia on admission.  # COPD. Stable. Continue on bronchodilators, antibiotics, prednisone. We will lower the dose of prednisone to 30 today.   # Hypertension. Blood pressure stable. Continue Lasix and metoprolol.  #Hypomagnesemia likely due to diuretics: Repleted magnesium sulfate repeat labs. We will repeat magnesium today  # Diffuse large B-cell lymphoma. Follow-up with oncology.   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)   Hypoglycemia   Acute encephalopathy   Acute diastolic heart failure (HCC)   COPD (chronic obstructive pulmonary disease) (HCC)   Atrial fibrillation with RVR (HCC)   Acute on chronic respiratory failure (HCC)   Morbid obesity (HCC)  DVT prophylaxis: Systemic anticoagulation Code Status: Full code Family Communication: Discussed with the patient's wife in detail over phone, as per patient's request Disposition Plan: Likely discharge home tomorrow    Consultants:   None  Procedures: Echo Antimicrobials: Levaquin completed today, 7 days course  Subjective: Patient was seen and examined at bedside. No new event. Reported feeling good. Denied chest pain, shortness of breath, nausea, vomiting or headache. Objective: Vitals:   04/03/16 0500 04/03/16 0600 04/03/16 0845 04/03/16 0850  BP: 119/77 (!) 127/94    Pulse: 77 87    Resp: 18 19    Temp:   98 F (36.7 C)   TempSrc:   Oral   SpO2: 97% 98%  95%  Weight: 135.6 kg (298 lb 15.1 oz)     Height:        Intake/Output Summary (  Last 24 hours) at 04/03/16 1112 Last data filed at 04/03/16 0846  Gross per 24 hour  Intake              960 ml  Output             4100 ml  Net            -3140 ml   Filed Weights   04/02/16 0400 04/02/16 0500 04/03/16  0500  Weight: (!) 136.5 kg (300 lb 14.9 oz) (!) 142.3 kg (313 lb 11.4 oz) 135.6 kg (298 lb 15.1 oz)    Examination:  General exam: Pleasant male lying flat on bed, not in distress Respiratory system: Clear bilaterally, no wheeze or crackle Cardiovascular system: Regular rate rhythm, S1-S2 normal. No pedal edema. Gastrointestinal system: Abdomen is nondistended, soft and nontender. Normal bowel sounds heard. Central nervous system: Alert awake, following commands. Extremities: Symmetric 5 x 5 power. Skin: No rashes, lesions or ulcers Psychiatry: Judgement and insight appear good.     Data Reviewed: I have personally reviewed following labs and imaging studies  CBC:  Recent Labs Lab 03/28/16 1101 03/28/16 1106 03/30/16 0402 03/31/16 0427  WBC 8.6  --  14.9* 14.1*  NEUTROABS 6.7  --   --   --   HGB 14.2 15.3 13.5 13.5  HCT 44.9 45.0 42.0 41.9  MCV 97.8  --  94.8 93.3  PLT 287  --  277 [id number]   Basic Metabolic Panel:  Recent Labs Lab 03/30/16 0402 03/31/16 0427 04/01/16 0447 04/02/16 0411 04/03/16 0421  NA 137 136 138 136 137  K 4.8 4.7 4.9 3.8 3.8  CL 96* 93* 93* 92* 94*  CO2 34* 37* 39* 37* 34*  GLUCOSE 170* 239* 136* 96 144*  BUN 37* 44* 39* 33* 31*  CREATININE 0.88 1.16 0.97 0.91 0.85  CALCIUM 8.9 8.9 9.3 8.9 8.8*  MG  --  1.4* 1.7 1.4* 1.7   GFR: Estimated Creatinine Clearance: 123.5 mL/min (by C-G formula based on SCr of 0.85 mg/dL). Liver Function Tests:  Recent Labs Lab 03/28/16 1101  AST 23  ALT 21  ALKPHOS 98  BILITOT 0.6  PROT 7.1  ALBUMIN 2.9*   No results for input(s): LIPASE, AMYLASE in the last 168 hours. No results for input(s): AMMONIA in the last 168 hours. Coagulation Profile:  Recent Labs Lab 03/30/16 0402 03/31/16 0427 04/01/16 0447 04/02/16 0411 04/03/16 0421  INR 4.19* 2.73 1.91 1.98 2.10   Cardiac Enzymes:  Recent Labs Lab 03/28/16 1813 03/28/16 2332 03/29/16 0453  TROPONINI 0.03* <0.03 0.03*   BNP (last 3  results) No results for input(s): PROBNP in the last 8760 hours. HbA1C: No results for input(s): HGBA1C in the last 72 hours. CBG:  Recent Labs Lab 04/02/16 0737 04/02/16 1050 04/02/16 1648 04/02/16 2128 04/03/16 0844  GLUCAP 101* 239* 363* 300* 234*   Lipid Profile: No results for input(s): CHOL, HDL, LDLCALC, TRIG, CHOLHDL, LDLDIRECT in the last 72 hours. Thyroid Function Tests: No results for input(s): TSH, T4TOTAL, FREET4, T3FREE, THYROIDAB in the last 72 hours. Anemia Panel: No results for input(s): VITAMINB12, FOLATE, FERRITIN, TIBC, IRON, RETICCTPCT in the last 72 hours. Sepsis Labs:  Recent Labs Lab 03/28/16 1131 03/28/16 1452  LATICACIDVEN 0.87 1.4    Recent Results (from the past 240 hour(s))  Blood Culture (routine x 2)     Status: None   Collection Time: 03/28/16 11:01 AM  Result Value Ref Range Status   Specimen Description BLOOD DRAWN BY  IV THERAPY  Final   Special Requests BOTTLES DRAWN AEROBIC AND ANAEROBIC 6 CC EACH  Final   Culture NO GROWTH 5 DAYS  Final   Report Status 04/02/2016 FINAL  Final  Blood Culture (routine x 2)     Status: None   Collection Time: 03/28/16 11:17 AM  Result Value Ref Range Status   Specimen Description BLOOD RIGHT WRIST  Final   Special Requests BOTTLES DRAWN AEROBIC AND ANAEROBIC 5 CC EACH  Final   Culture NO GROWTH 5 DAYS  Final   Report Status 04/02/2016 FINAL  Final  Urine culture     Status: None   Collection Time: 03/28/16 11:29 AM  Result Value Ref Range Status   Specimen Description URINE, CATHETERIZED  Final   Special Requests NONE  Final   Culture NO GROWTH Performed at [facility]   Final   Report Status 03/29/2016 FINAL  Final  MRSA PCR Screening     Status: None   Collection Time: 03/28/16  4:12 PM  Result Value Ref Range Status   MRSA by PCR NEGATIVE NEGATIVE Final    Comment:        The GeneXpert MRSA Assay (FDA approved for NASAL specimens only), is one component of a comprehensive MRSA  colonization surveillance program. It is not intended to diagnose MRSA infection nor to guide or monitor treatment for MRSA infections.   MRSA PCR Screening     Status: None   Collection Time: 04/01/16  9:40 AM  Result Value Ref Range Status   MRSA by PCR NEGATIVE NEGATIVE Final    Comment:        The GeneXpert MRSA Assay (FDA approved for NASAL specimens only), is one component of a comprehensive MRSA colonization surveillance program. It is not intended to diagnose MRSA infection nor to guide or monitor treatment for MRSA infections.          Radiology Studies: No results found.      Scheduled Meds: . furosemide  40 mg Oral Daily  . insulin aspart  0-12 Units Subcutaneous QHS  . insulin aspart  0-20 Units Subcutaneous TID WC  . insulin aspart  10 Units Subcutaneous TID WC  . insulin glargine  45 Units Subcutaneous Daily  . ipratropium-albuterol  3 mL Nebulization TID  . mouth rinse  15 mL Mouth Rinse BID  . metoprolol  100 mg Oral BID  . predniSONE  40 mg Oral Q breakfast  . simvastatin  40 mg Oral q1800  . sodium chloride flush  3 mL Intravenous Q12H  . warfarin  5 mg Oral Once  . Warfarin - Pharmacist Dosing Inpatient   Does not apply Q24H   Continuous Infusions:   LOS: 6 days    Osiah Haring Tanna Furry, MD [facility] Pager [phone]  If 7PM-7AM, please contact night-coverage [email or web address] Password [email or web address] 04/03/2016, 11:12 AM

## 2016-04-03 NOTE — Progress Notes (Signed)
Patient has been confused and up most of night CPAP has not been used.

## 2016-04-03 NOTE — Progress Notes (Addendum)
Subjective: He says he feels okay. No new complaints. His breathing is okay. He was confused and did not use CPAP most of the night last night. He is to complete his antibiotics today.  Objective: Vital signs in last 24 hours: Temp:  [97.7 F (36.5 C)-99.1 F (37.3 C)] 98 F (36.7 C) (12/10 0845) Pulse Rate:  [71-98] 87 (12/10 0600) Resp:  [9-23] 19 (12/10 0600) BP: (87-146)/(57-94) 127/94 (12/10 0600) SpO2:  [91 %-98 %] 95 % (12/10 0850) Weight:  [135.6 kg (298 lb 15.1 oz)] 135.6 kg (298 lb 15.1 oz) (12/10 0500) Weight change: -0.9 kg (-1 lb 15.8 oz) Last BM Date: 04/02/16  Intake/Output from previous day: 12/09 0701 - 12/10 0700 In: 1440 [P.O.:1440] Out: 4100 [Urine:4100]  PHYSICAL EXAM General appearance: alert, cooperative and no distress Resp: clear to auscultation bilaterally Cardio: His heart is irregularly irregular with atrial fib. Good ventricular control. No gallop GI: soft, non-tender; bowel sounds normal; no masses,  no organomegaly Extremities: extremities normal, atraumatic, no cyanosis or edema Skin warm and dry. Pupils reactive. Mucous membranes are moist  Lab Results:  Results for orders placed or performed during the hospital encounter of 03/28/16 (from the past 48 hour(s))  MRSA PCR Screening     Status: None   Collection Time: 04/01/16  9:40 AM  Result Value Ref Range   MRSA by PCR NEGATIVE NEGATIVE    Comment:        The GeneXpert MRSA Assay (FDA approved for NASAL specimens only), is one component of a comprehensive MRSA colonization surveillance program. It is not intended to diagnose MRSA infection nor to guide or monitor treatment for MRSA infections.   Glucose, capillary     Status: Abnormal   Collection Time: 04/01/16 11:12 AM  Result Value Ref Range   Glucose-Capillary 247 (H) 65 - 99 mg/dL   Comment 1 Notify RN   Glucose, capillary     Status: Abnormal   Collection Time: 04/01/16  4:12 PM  Result Value Ref Range   Glucose-Capillary  186 (H) 65 - 99 mg/dL  Glucose, capillary     Status: None   Collection Time: 04/01/16 10:54 PM  Result Value Ref Range   Glucose-Capillary 95 65 - 99 mg/dL   Comment 1 Notify RN    Comment 2 Document in Chart   Basic metabolic panel     Status: Abnormal   Collection Time: 04/02/16  4:11 AM  Result Value Ref Range   Sodium 136 135 - 145 mmol/L   Potassium 3.8 3.5 - 5.1 mmol/L    Comment: DELTA CHECK NOTED   Chloride 92 (L) 101 - 111 mmol/L   CO2 37 (H) 22 - 32 mmol/L   Glucose, Bld 96 65 - 99 mg/dL   BUN 33 (H) 6 - 20 mg/dL   Creatinine, Ser 0.91 0.61 - 1.24 mg/dL   Calcium 8.9 8.9 - 10.3 mg/dL   GFR calc non Af Amer >60 >60 mL/min   GFR calc Af Amer >60 >60 mL/min    Comment: (NOTE) The eGFR has been calculated using the CKD EPI equation. This calculation has not been validated in all clinical situations. eGFR's persistently <60 mL/min signify possible Chronic Kidney Disease.    Anion gap 7 5 - 15  Protime-INR     Status: Abnormal   Collection Time: 04/02/16  4:11 AM  Result Value Ref Range   Prothrombin Time 22.8 (H) 11.4 - 15.2 seconds   INR 1.98   Magnesium  Status: Abnormal   Collection Time: 04/02/16  4:11 AM  Result Value Ref Range   Magnesium 1.4 (L) 1.7 - 2.4 mg/dL  Glucose, capillary     Status: Abnormal   Collection Time: 04/02/16  7:37 AM  Result Value Ref Range   Glucose-Capillary 101 (H) 65 - 99 mg/dL  Glucose, capillary     Status: Abnormal   Collection Time: 04/02/16 10:50 AM  Result Value Ref Range   Glucose-Capillary 239 (H) 65 - 99 mg/dL  Glucose, capillary     Status: Abnormal   Collection Time: 04/02/16  4:48 PM  Result Value Ref Range   Glucose-Capillary 363 (H) 65 - 99 mg/dL  Glucose, capillary     Status: Abnormal   Collection Time: 04/02/16  9:28 PM  Result Value Ref Range   Glucose-Capillary 300 (H) 65 - 99 mg/dL   Comment 1 Notify RN   Protime-INR     Status: Abnormal   Collection Time: 04/03/16  4:21 AM  Result Value Ref Range    Prothrombin Time 23.9 (H) 11.4 - 15.2 seconds   INR 0.10   Basic metabolic panel     Status: Abnormal   Collection Time: 04/03/16  4:21 AM  Result Value Ref Range   Sodium 137 135 - 145 mmol/L   Potassium 3.8 3.5 - 5.1 mmol/L   Chloride 94 (L) 101 - 111 mmol/L   CO2 34 (H) 22 - 32 mmol/L   Glucose, Bld 144 (H) 65 - 99 mg/dL   BUN 31 (H) 6 - 20 mg/dL   Creatinine, Ser 0.85 0.61 - 1.24 mg/dL   Calcium 8.8 (L) 8.9 - 10.3 mg/dL   GFR calc non Af Amer >60 >60 mL/min   GFR calc Af Amer >60 >60 mL/min    Comment: (NOTE) The eGFR has been calculated using the CKD EPI equation. This calculation has not been validated in all clinical situations. eGFR's persistently <60 mL/min signify possible Chronic Kidney Disease.    Anion gap 9 5 - 15  Magnesium     Status: None   Collection Time: 04/03/16  4:21 AM  Result Value Ref Range   Magnesium 1.7 1.7 - 2.4 mg/dL  Glucose, capillary     Status: Abnormal   Collection Time: 04/03/16  8:44 AM  Result Value Ref Range   Glucose-Capillary 234 (H) 65 - 99 mg/dL   Comment 1 Notify RN    Comment 2 Document in Chart     ABGS  Recent Labs  03/31/16 1540  PHART 7.434  PO2ART 66.9*  HCO3 32.7*   CULTURES Recent Results (from the past 240 hour(s))  Blood Culture (routine x 2)     Status: None   Collection Time: 03/28/16 11:01 AM  Result Value Ref Range Status   Specimen Description BLOOD DRAWN BY IV THERAPY  Final   Special Requests BOTTLES DRAWN AEROBIC AND ANAEROBIC 6 CC EACH  Final   Culture NO GROWTH 5 DAYS  Final   Report Status 04/02/2016 FINAL  Final  Blood Culture (routine x 2)     Status: None   Collection Time: 03/28/16 11:17 AM  Result Value Ref Range Status   Specimen Description BLOOD RIGHT WRIST  Final   Special Requests BOTTLES DRAWN AEROBIC AND ANAEROBIC 5 CC EACH  Final   Culture NO GROWTH 5 DAYS  Final   Report Status 04/02/2016 FINAL  Final  Urine culture     Status: None   Collection Time: 03/28/16 11:29 AM  Result  Value Ref Range Status   Specimen Description URINE, CATHETERIZED  Final   Special Requests NONE  Final   Culture NO GROWTH Performed at [facility]   Final   Report Status 03/29/2016 FINAL  Final  MRSA PCR Screening     Status: None   Collection Time: 03/28/16  4:12 PM  Result Value Ref Range Status   MRSA by PCR NEGATIVE NEGATIVE Final    Comment:        The GeneXpert MRSA Assay (FDA approved for NASAL specimens only), is one component of a comprehensive MRSA colonization surveillance program. It is not intended to diagnose MRSA infection nor to guide or monitor treatment for MRSA infections.   MRSA PCR Screening     Status: None   Collection Time: 04/01/16  9:40 AM  Result Value Ref Range Status   MRSA by PCR NEGATIVE NEGATIVE Final    Comment:        The GeneXpert MRSA Assay (FDA approved for NASAL specimens only), is one component of a comprehensive MRSA colonization surveillance program. It is not intended to diagnose MRSA infection nor to guide or monitor treatment for MRSA infections.    Studies/Results: No results found.  Medications:  Prior to Admission:  Prescriptions Prior to Admission  Medication Sig Dispense Refill Last Dose  . acetaminophen (TYLENOL) 500 MG tablet Take 1,000 mg by mouth every 6 (six) hours as needed for headache. Reported on 10/08/2015   unknown  . ALPRAZolam (XANAX) 0.25 MG tablet TAKE ONE TABLET BY MOUTH AT BEDTIME 30 tablet 2 unknown  . Cholecalciferol (VITAMIN D3) 5000 UNITS TABS Take 1 tablet by mouth every morning.   unknown  . enalapril (VASOTEC) 20 MG tablet TAKE ONE TABLET BY MOUTH ONCE DAILY (Patient taking differently: TAKE ONE TABLET BY MOUTH TWICE DAILY) 30 tablet 5 unknown at Unknown time  . guaiFENesin (MUCINEX) 600 MG 12 hr tablet Take 600 mg by mouth 2 (two) times daily as needed for cough or to loosen phlegm.   Past Week at Unknown time  . insulin NPH Human (HUMULIN N,NOVOLIN N) 100 UNIT/ML injection Inject 35  Units into the skin daily before breakfast.    unknown  . insulin regular (NOVOLIN R,HUMULIN R) 250 units/2.70m (100 units/mL) injection Inject 10 Units into the skin 2 (two) times daily before a meal. States he use regular insulin needle and he only use this insulin only as needed as back up if Novolin R does not work     . ipratropium-albuterol (DUONEB) 0.5-2.5 (3) MG/3ML SOLN PLACE 1 VIAL (3 MLS) IN THE NEBULIZER EVERY 6 HOURS AS NEEDED 1080 mL 0 Past Week at Unknown time  . metFORMIN (GLUCOPHAGE) 500 MG tablet TAKE ONE TABLET BY MOUTH IN THE MORNING AND TAKE  TWO TABLETS IN THE EVENING 270 tablet 0 unknown  . metoprolol (LOPRESSOR) 100 MG tablet TAKE ONE TABLET BY MOUTH TWICE DAILY 180 tablet 1 unknown  . NOVOLIN R RELION 100 UNIT/ML injection INJECT 32 UNITS SUBCUTANEOUSLY TWICE A DAY BEFORE A MEAL 20 mL 2 unknown  . simvastatin (ZOCOR) 40 MG tablet TAKE ONE TABLET BY MOUTH ONCE DAILY 90 tablet 0 unknown  . warfarin (COUMADIN) 5 MG tablet TAKE ONE & ONE-HALF TABLETS BY MOUTH ONCE DAILY EVERY MONDAY AND EVERY FRIDAY THEN TAKE ONE  TABLET THE REST OF THE WEEK 140 tablet 2 unknown  . albuterol (PROAIR HFA) 108 (90 BASE) MCG/ACT inhaler Inhale 2 puffs into the lungs every 6 (six) hours  as needed for wheezing or shortness of breath. 8.5 g 5 Taking  . Blood Glucose Calibration (TAI DOC CONTROL) NORMAL SOLN Use as directed.   Taking  . Blood Glucose Monitoring Suppl (CLEVER CHEK AUTO-CODE VOICE) DEVI USE AS DIRECTED 1 each 2 Taking  . Lanc[employer] Devices (LANCING DEVICE) MISC USE AS DIRECTED 1 each 2 Taking   Scheduled: . furosemide  40 mg Oral Daily  . insulin aspart  0-12 Units Subcutaneous QHS  . insulin aspart  0-20 Units Subcutaneous TID WC  . insulin aspart  10 Units Subcutaneous TID WC  . insulin glargine  45 Units Subcutaneous Daily  . ipratropium-albuterol  3 mL Nebulization TID  . mouth rinse  15 mL Mouth Rinse BID  . metoprolol  100 mg Oral BID  . predniSONE  40 mg Oral Q breakfast  .  simvastatin  40 mg Oral q1800  . sodium chloride flush  3 mL Intravenous Q12H  . warfarin  5 mg Oral Once  . Warfarin - Pharmacist Dosing Inpatient   Does not apply Q24H   Continuous:  ONG:EXBMWU chloride, acetaminophen, ipratropium-albuterol, ondansetron (ZOFRAN) IV, sodium chloride flush  Assesment: He was admitted with acute on chronic respiratory failure COPD exacerbation and pneumonia. He was acutely encephalopathic. All of these have improved. He also had what appears to be acute on chronic diastolic heart failure and that has also improved. At baseline he uses oxygen at 2 L and he has obstructive sleep apnea on CPAP. Clinically he does not appear to be having trouble with hypercapnia despite not using CPAP last night. 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 Continue tapering steroids as clinically determined. Finish up his antibiotics. Encourage CPAP   LOS: 6 days   Manna Gose L 04/03/2016, 9:33 AM

## 2016-04-04 LAB — GLUCOSE, CAPILLARY
GLUCOSE-CAPILLARY: 90 mg/dL (ref 65–99)
Glucose-Capillary: 185 mg/dL — ABNORMAL HIGH (ref 65–99)
Glucose-Capillary: 232 mg/dL — ABNORMAL HIGH (ref 65–99)

## 2016-04-04 LAB — PROTIME-INR
INR: 2.34
PROTHROMBIN TIME: 26.1 s — AB (ref 11.4–15.2)

## 2016-04-04 MED ORDER — WARFARIN SODIUM 5 MG PO TABS: 5.0000 mg | ORAL_TABLET | Freq: Once | ORAL | Status: DC

## 2016-04-04 MED ORDER — INSULIN REGULAR HUMAN 100 UNIT/ML IJ SOLN: 10.0000 [IU] | Freq: Three times a day (TID) | INTRAMUSCULAR | 0 refills | Status: DC

## 2016-04-04 MED ORDER — FUROSEMIDE 40 MG PO TABS: 40.0000 mg | ORAL_TABLET | Freq: Every day | ORAL | 0 refills | Status: DC

## 2016-04-04 MED ORDER — INSULIN NPH (HUMAN) (ISOPHANE) 100 UNIT/ML ~~LOC~~ SUSP: 45.0000 [IU] | Freq: Every day | SUBCUTANEOUS | 0 refills | Status: DC

## 2016-04-04 MED ORDER — PREDNISONE 10 MG PO TABS: ORAL_TABLET | ORAL | 0 refills | Status: DC

## 2016-04-04 MED ORDER — ALBUTEROL SULFATE HFA 108 (90 BASE) MCG/ACT IN AERS: 2.0000 | INHALATION_SPRAY | Freq: Four times a day (QID) | RESPIRATORY_TRACT | 5 refills | Status: DC | PRN

## 2016-04-04 NOTE — Progress Notes (Signed)
Patient successfully voided in commode after catheter removal. Patient voices no complaints.

## 2016-04-04 NOTE — Discharge Summary (Signed)
Physician Discharge Summary  Justin Ruiz [id number] DOB: Jun 20, 1950 DOA: 03/28/2016  PCP: Redge Gainer, MD  Admit date: 03/28/2016 Discharge date: 04/04/2016  Admitted From:home Disposition:home with home care  Recommendations for Outpatient Follow-up:  1. Follow up with PCP in 1-2 weeks 2. Please obtain BMP/CBC in one week   Home Health:yes Equipment/Devices:oxygen Discharge Condition:stable CODE STATUS:full Diet recommendation:Carb modified heart healthy diet.  Brief/Interim Summary:65 y/o male with multiple medical problems who was brought to ED when he was found unresponsive and hypoglycemic. He has had cough and cold symptoms for the past week. He was found to have respiratory failure due to pneumonia/CHF. He was started on bipap for hypercapnea and has since improved.  He was diuresed for CHF and completed  abx for pneumonia.   Please see below for detail: # Acute on chronic hypoxic/hypercapnic respiratory failure. Likely related to congestive heart failure and pneumonia. The patient has underlying COPD.  -Clinically improving. Repeat x-ray clear. He is using  2-3 L today. He uses 2 L of oxygen at home. Plan to discharge home today with outpatient follow-up.  # Acute on chronic congestive heart failure, likely diastolic. Repeat echocardiogram with left ventricular ejection fraction of 50%. Indeterminate diastolic function due to the afib.  -Patient is negative by almost 18 L  since admission. On oral Lasix daily discharge. Advised to monitor lab, daily weight and low salt at home. Currently on metoprolol. Advised to follow-up with cardiologist as an outpatient.  # Atrial fibrillation with rapid ventricular response. s/p Cardizem drip. Heart rate improving with metoprolol 100 mg twice a day. He is on Coumadin for systemic anticoagulation. Monitor INR.   # Community acquired pneumonia. Presented with shortness of breath and x-ray consistent with possible pneumonia.  Completed 7 days of Levaquin.  # Acute metabolic encephalopathy. Likely related to hypoglycemia as well as elevated PCO2 on admission. CT head was negative. Continue to monitor. Mental status improved.  # Uncontrolled insulin-dependent diabetes with hyperglycemia: Blood sugar level fluctuating. Insulin dose adjusted as below. I advised patient to monitor blood sugar level at home. Avoid hypoglycemic episode. -Hba1c 5.9 only  # COPD. Stable. Continue on bronchodilators, antibiotics, prednisone. Discharged with tapering dose of prednisone.   # Hypertension. Blood pressure stable. Continue Lasix and metoprolol.  #Hypomagnesemia likely due to diuretics: Repleted magnesium sulfate.   # Diffuse large B-cell lymphoma. Follow-up with oncology.   Today, patient was sitting on chair reported feeling good. He was waiting only 2-3 L of oxygen. Denied chest pain, shortness of breath, cough. At this time I believe patient's care can be transferred to outpatient.  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)   Hypoglycemia   Acute encephalopathy   Acute diastolic heart failure (HCC)   COPD (chronic obstructive pulmonary disease) (HCC)   Atrial fibrillation with RVR (HCC)   Acute on chronic respiratory failure (HCC)   Morbid obesity ([name])   Hypomagnesemia    Discharge Instructions  Discharge Instructions    (HEART FAILURE PATIENTS) Call MD:  Anytime you have any of the following symptoms: 1) 3 pound weight gain in 24 hours or 5 pounds in 1 week 2) shortness of breath, with or without a dry hacking cough 3) swelling in the hands, feet or stomach 4) if you have to sleep  on extra pillows at night in order to breathe.    Complete by:  As directed    AMB  Referral to HF Clinic    Complete by:  As directed    Call MD for:  difficulty breathing, headache or visual disturbances    Complete by:  As directed    Call MD for:  hives    Complete by:  As directed    Call MD for:  persistant dizziness or light-headedness    Complete by:  As directed    Call MD for:  persistant nausea and vomiting    Complete by:  As directed    Call MD for:  severe uncontrolled pain    Complete by:  As directed    Call MD for:  temperature >100.4    Complete by:  As directed    Diet - low sodium heart healthy    Complete by:  As directed    Diet Carb Modified    Complete by:  As directed    Discharge instructions    Complete by:  As directed    Recheck blood pressure and blood sugar level at home. Please follow up with your primary care doctor. You may need follow-up with cardiologist please discussed with the family doctor.   For home use only DME oxygen    Complete by:  As directed    Use 2-3 liters as needed to maintain o2 sat >90 %   Liters per Minute:  3   Frequency:  Continuous (stationary and portable oxygen unit needed)   Oxygen conserving device:  Yes   Oxygen delivery system:  Gas   Increase activity slowly    Complete by:  As directed        Medication List    STOP taking these medications   enalapril 20 MG tablet Commonly known as:  VASOTEC     TAKE these medications   acetaminophen 500 MG tablet Commonly known as:  TYLENOL Take 1,000 mg by mouth every 6 (six) hours as needed for headache. Reported on 10/08/2015   albuterol 108 (90 Base) MCG/ACT inhaler Commonly known as:  PROAIR HFA Inhale 2 puffs into the lungs every 6 (six) hours as needed for wheezing or shortness of breath.   ALPRAZolam 0.25 MG tablet Commonly known as:  XANAX TAKE ONE TABLET BY MOUTH AT BEDTIME   CLEVER CHEK AUTO-CODE VOICE Devi USE AS DIRECTED   furosemide 40 MG tablet Commonly known as:  LASIX Take 1 tablet (40 mg total) by mouth daily. Start taking  on:  04/05/2016   guaiFENesin 600 MG 12 hr tablet Commonly known as:  MUCINEX Take 600 mg by mouth 2 (two) times daily as needed for cough or to loosen phlegm.   insulin NPH Human 100 UNIT/ML injection Commonly known as:  HUMULIN N,NOVOLIN N Inject 0.45 mLs (45 Units total) into the skin daily before breakfast. What changed:  how much to take   insulin regular 250 units/2.5mL (100 units/mL) injection Commonly known as:  NOVOLIN R,HUMULIN R Inject 0.1 mLs (10 Units total) into the skin 3 (three) times daily before meals. States he use regular insulin needle and he only use this insulin only as needed as back up if Novolin R does not work What changed:  when to take this  Another medication with the same name was removed. Continue taking this medication, and follow the directions you see here.   ipratropium-albuterol 0.5-2.5 (3) MG/3ML Soln Commonly known as:  DUONEB   PLACE 1 VIAL (3 MLS) IN THE NEBULIZER EVERY 6 HOURS AS NEEDED   Lancing Device Misc USE AS DIRECTED   metFORMIN 500 MG tablet Commonly known as:  GLUCOPHAGE TAKE ONE TABLET BY MOUTH IN THE MORNING AND TAKE  TWO TABLETS IN THE EVENING   metoprolol 100 MG tablet Commonly known as:  LOPRESSOR TAKE ONE TABLET BY MOUTH TWICE DAILY   predniSONE 10 MG tablet Commonly known as:  DELTASONE Take 30 mg daily for 3 days, then 20 mg for 3 days, then 10 mg for 3 days and stop.   simvastatin 40 MG tablet Commonly known as:  ZOCOR TAKE ONE TABLET BY MOUTH ONCE DAILY   TAI DOC CONTROL Normal Soln Use as directed.   Vitamin D3 5000 units Tabs Take 1 tablet by mouth every morning.   warfarin 5 MG tablet Commonly known as:  COUMADIN TAKE ONE & ONE-HALF TABLETS BY MOUTH ONCE DAILY EVERY MONDAY AND EVERY FRIDAY THEN TAKE ONE  TABLET THE REST OF THE WEEK            Durable Medical Equipment        Start     Ordered   04/04/16 0000  For home use only DME oxygen    Comments:  Use 2-3 liters as needed to maintain o2  sat >90 %  Question Answer Comment  Liters per Minute 3   Frequency Continuous (stationary and portable oxygen unit needed)   Oxygen conserving device Yes   Oxygen delivery system Gas      04/04/16 1101     Follow-up Information    [facility] up.   Contact information: [place] [address] [phone]        Redge Gainer, MD. Schedule an appointment as soon as possible for a visit in 1 week(s).   Specialty:  Family Medicine Contact information: [address] [place] [address] [phone]          Allergies  Allergen Reactions  . Avapro [Irbesartan] Other (See Comments)    "doesnt sit right with me"--light headed  . Lipitor [Atorvastatin Calcium] Other (See Comments)    Leg pain  . Penicillins     Unknown reaction    Consultations: Pulmonologist  Procedures/Studies: BiPAP  Subjective: Patient was seen and examined at bedside. Patient was sitting on chair reported feeling good. Denied fever, chills, headache, dizziness, chest pain, shortness of breath, cough. He reported feeling good to go home today. He verbalized understanding of follow-up instructions.   Discharge Exam: Vitals:   04/04/16 0900 04/04/16 1000  BP: 114/72 104/68  Pulse: 80 88  Resp: 18   Temp:     Vitals:   04/04/16 0800 04/04/16 0830 04/04/16 0900 04/04/16 1000  BP: 112/83  114/72 104/68  Pulse: 95  80 88  Resp:   18   Temp:  97.5 F (36.4 C)    TempSrc:  Oral    SpO2: 94%  95% 95%  Weight:      Height:        General: Pt is alert, awake, not in acute distress Cardiovascular: RRR, S1/S2 +, no rubs, no gallops Respiratory: CTA bilaterally, no wheezing, no rhonchi Abdominal: Soft, NT, ND, bowel sounds + Extremities: no edema, no cyanosis Neuro: alert, awake, oriented x3, non-focal exam   The results of significant diagnostics from this hospitalization (including imaging, microbiology, ancillary and laboratory) are  listed below for reference.     Microbiology: Recent  Results (from the past 240 hour(s))  Blood Culture (routine x 2)     Status: None   Collection Time: 03/28/16 11:01 AM  Result Value Ref Range Status   Specimen Description BLOOD DRAWN BY IV THERAPY  Final   Special Requests BOTTLES DRAWN AEROBIC AND ANAEROBIC 6 CC EACH  Final   Culture NO GROWTH 5 DAYS  Final   Report Status 04/02/2016 FINAL  Final  Blood Culture (routine x 2)     Status: None   Collection Time: 03/28/16 11:17 AM  Result Value Ref Range Status   Specimen Description BLOOD RIGHT WRIST  Final   Special Requests BOTTLES DRAWN AEROBIC AND ANAEROBIC 5 CC EACH  Final   Culture NO GROWTH 5 DAYS  Final   Report Status 04/02/2016 FINAL  Final  Urine culture     Status: None   Collection Time: 03/28/16 11:29 AM  Result Value Ref Range Status   Specimen Description URINE, CATHETERIZED  Final   Special Requests NONE  Final   Culture NO GROWTH Performed at [facility]   Final   Report Status 03/29/2016 FINAL  Final  MRSA PCR Screening     Status: None   Collection Time: 03/28/16  4:12 PM  Result Value Ref Range Status   MRSA by PCR NEGATIVE NEGATIVE Final    Comment:        The GeneXpert MRSA Assay (FDA approved for NASAL specimens only), is one component of a comprehensive MRSA colonization surveillance program. It is not intended to diagnose MRSA infection nor to guide or monitor treatment for MRSA infections.   MRSA PCR Screening     Status: None   Collection Time: 04/01/16  9:40 AM  Result Value Ref Range Status   MRSA by PCR NEGATIVE NEGATIVE Final    Comment:        The GeneXpert MRSA Assay (FDA approved for NASAL specimens only), is one component of a comprehensive MRSA colonization surveillance program. It is not intended to diagnose MRSA infection nor to guide or monitor treatment for MRSA infections.      Labs: BNP (last 3 results)  Recent Labs  06/07/15 0338 03/28/16 1117   BNP 206.0* 147.8*   Basic Metabolic Panel:  Recent Labs Lab 03/30/16 0402 03/31/16 0427 04/01/16 0447 04/02/16 0411 04/03/16 0421  NA 137 136 138 136 137  K 4.8 4.7 4.9 3.8 3.8  CL 96* 93* 93* 92* 94*  CO2 34* 37* 39* 37* 34*  GLUCOSE 170* 239* 136* 96 144*  BUN 37* 44* 39* 33* 31*  CREATININE 0.88 1.16 0.97 0.91 0.85  CALCIUM 8.9 8.9 9.3 8.9 8.8*  MG  --  1.4* 1.7 1.4* 1.7   Liver Function Tests: No results for input(s): AST, ALT, ALKPHOS, BILITOT, PROT, ALBUMIN in the last 168 hours. No results for input(s): LIPASE, AMYLASE in the last 168 hours. No results for input(s): AMMONIA in the last 168 hours. CBC:  Recent Labs Lab 03/28/16 1106 03/30/16 0402 03/31/16 0427  WBC  --  14.9* 14.1*  HGB 15.3 13.5 13.5  HCT 45.0 42.0 41.9  MCV  --  94.8 93.3  PLT  --  277 269   Cardiac Enzymes:  Recent Labs Lab 03/28/16 1813 03/28/16 2332 03/29/16 0453  TROPONINI 0.03* <0.03 0.03*   BNP: Invalid input(s): POCBNP CBG:  Recent Labs Lab 04/03/16 1221 04/03/16 1610 04/03/16 1949 04/04/16 0742 04/04/16 0841  GLUCAP 274* 209* 218* 90 185*   D-Dimer No results for  input(s): DDIMER in the last 72 hours. Hgb A1c No results for input(s): HGBA1C in the last 72 hours. Lipid Profile No results for input(s): CHOL, HDL, LDLCALC, TRIG, CHOLHDL, LDLDIRECT in the last 72 hours. Thyroid function studies No results for input(s): TSH, T4TOTAL, T3FREE, THYROIDAB in the last 72 hours.  Invalid input(s): FREET3 Anemia work up No results for input(s): VITAMINB12, FOLATE, FERRITIN, TIBC, IRON, RETICCTPCT in the last 72 hours. Urinalysis    Component Value Date/Time   COLORURINE YELLOW 03/28/2016 1129   APPEARANCEUR HAZY (A) 03/28/2016 1129   LABSPEC 1.025 03/28/2016 1129   PHURINE 6.0 03/28/2016 1129   GLUCOSEU 100 (A) 03/28/2016 1129   HGBUR MODERATE (A) 03/28/2016 1129   BILIRUBINUR NEGATIVE 03/28/2016 1129   BILIRUBINUR neg 05/01/2013 1115   KETONESUR NEGATIVE  03/28/2016 1129   PROTEINUR 100 (A) 03/28/2016 1129   UROBILINOGEN negative 05/01/2013 1115   UROBILINOGEN 0.2 05/18/2010 0352   NITRITE NEGATIVE 03/28/2016 1129   LEUKOCYTESUR NEGATIVE 03/28/2016 1129   Sepsis Labs Invalid input(s): PROCALCITONIN,  WBC,  LACTICIDVEN Microbiology Recent Results (from the past 240 hour(s))  Blood Culture (routine x 2)     Status: None   Collection Time: 03/28/16 11:01 AM  Result Value Ref Range Status   Specimen Description BLOOD DRAWN BY IV THERAPY  Final   Special Requests BOTTLES DRAWN AEROBIC AND ANAEROBIC 6 CC EACH  Final   Culture NO GROWTH 5 DAYS  Final   Report Status 04/02/2016 FINAL  Final  Blood Culture (routine x 2)     Status: None   Collection Time: 03/28/16 11:17 AM  Result Value Ref Range Status   Specimen Description BLOOD RIGHT WRIST  Final   Special Requests BOTTLES DRAWN AEROBIC AND ANAEROBIC 5 CC EACH  Final   Culture NO GROWTH 5 DAYS  Final   Report Status 04/02/2016 FINAL  Final  Urine culture     Status: None   Collection Time: 03/28/16 11:29 AM  Result Value Ref Range Status   Specimen Description URINE, CATHETERIZED  Final   Special Requests NONE  Final   Culture NO GROWTH Performed at [facility]   Final   Report Status 03/29/2016 FINAL  Final  MRSA PCR Screening     Status: None   Collection Time: 03/28/16  4:12 PM  Result Value Ref Range Status   MRSA by PCR NEGATIVE NEGATIVE Final    Comment:        The GeneXpert MRSA Assay (FDA approved for NASAL specimens only), is one component of a comprehensive MRSA colonization surveillance program. It is not intended to diagnose MRSA infection nor to guide or monitor treatment for MRSA infections.   MRSA PCR Screening     Status: None   Collection Time: 04/01/16  9:40 AM  Result Value Ref Range Status   MRSA by PCR NEGATIVE NEGATIVE Final    Comment:        The GeneXpert MRSA Assay (FDA approved for NASAL specimens only), is one component of  a comprehensive MRSA colonization surveillance program. It is not intended to diagnose MRSA infection nor to guide or monitor treatment for MRSA infections.      Time coordinating discharge: Over 30 minutes  SIGNED:    Prasad , MD  [facility] 04/04/2016, 11:05 AM  If 7PM-7AM, please contact night-coverage [email or web address] Password [email or web address]  

## 2016-04-04 NOTE — Care Management Note (Signed)
Case Management Note  Patient Details  Name: Justin Ruiz MRN: [id number] Date of Birth: [birth year]  Expected Discharge Date:       04/04/2016           Expected Discharge Plan:  [place]  In-House Referral:  NA  Discharge planning Services  CM Consult  Post Acute Care Choice:  Home Health Choice offered to:  Patient  HH Arranged:  RN [facility] Agency:  [place]  Status of Service:  Completed, signed off  Additional Comments:  Pt discharging home today. Pt will have [facility] for disease management. Pt is aware that [facility] has 48hrs to make first visit. Romualdo Bolk, of [facility], aware of DC and will obtain pt info from chart. Pt has supplemental oxygen at home PTA.   Sherald Barge, RN 04/04/2016, 11:50 AM

## 2016-04-04 NOTE — Progress Notes (Signed)
Inpatient Diabetes Program Recommendations  AACE/ADA: New Consensus Statement on Inpatient Glycemic Control (2015)  Target Ranges:  Prepandial:   less than 140 mg/dL      Peak postprandial:   less than 180 mg/dL (1-2 hours)      Critically ill patients:  140 - 180 mg/dL   Results for YEHUDAH, GROUNDS (MRN [id number]) as of 04/04/2016 08:44  Ref. Range 04/03/2016 08:44 04/03/2016 12:21 04/03/2016 16:10 04/03/2016 19:49 04/04/2016 07:42  Glucose-Capillary Latest Ref Range: 65 - 99 mg/dL 234 (H) 274 (H) 209 (H) 218 (H) 90   Review of Glycemic Control  Diabetes history:DM2 Outpatient Diabetes medications: NPH 35 units QAM, Metformin 500 mg QAM, Metformin 1000 mg QPM, Regular 32 units BID Current orders for Inpatient glycemic control: Lantus 45 units daily, Novolog 0-20 units TID with meals, Novolog 0-12 units QHS, Novolog 10 units TID with meals for meal coverage  Inpatient Diabetes Program Recommendations: Insulin - Meal Coverage: Post prandial continues to be elevated despite ordered meal coverage. Please consider increasing meal coverage to Novolog 15 units TID with meals.  Thanks, Barnie Alderman, RN, MSN, CDE Diabetes Coordinator Inpatient Diabetes Program [phone] (Team Pager from 8am to 5pm)

## 2016-04-04 NOTE — Care Management Important Message (Signed)
Important Message  Patient Details  Name: Justin Ruiz MRN: [id number] Date of Birth: 1950-08-01   Medicare Important Message Given:  Yes    Sherald Barge, RN 04/04/2016, 11:52 AM

## 2016-04-04 NOTE — Progress Notes (Signed)
ANTICOAGULATION   Pharmacy Consult for Coumadin (chronic Rx PTA)  Indication: Afib  Allergies  Allergen Reactions  . Avapro [Irbesartan] Other (See Comments)    "doesnt sit right with me"--light headed  . Lipitor [Atorvastatin Calcium] Other (See Comments)    Leg pain  . Penicillins     Unknown reaction   Patient Measurements: Height: 6' (182.9 cm) Weight: 294 lb 12.1 oz (133.7 kg) IBW/kg (Calculated) : 77.6  Vital Signs: Temp: 97.5 F (36.4 C) (12/11 0830) Temp Source: Oral (12/11 0830) BP: 112/83 (12/11 0800) Pulse Rate: 95 (12/11 0800)  Labs:  Recent Labs  04/02/16 0411 04/03/16 0421 04/04/16 0444  LABPROT 22.8* 23.9* 26.1*  INR 1.98 2.10 2.34  CREATININE 0.91 0.85  --    Estimated Creatinine Clearance: 122.5 mL/min (by C-G formula based on SCr of 0.85 mg/dL).  Medical History: Past Medical History:  Diagnosis Date  . Arthritis   . Asthma   . BPH (benign prostatic hypertrophy)   . Colon polyps   . COPD (chronic obstructive pulmonary disease) ([place])   . Diabetes mellitus   . GERD (gastroesophageal reflux disease)   . History of oxygen administration    occ. oxygen use at 2 l/m as needed- not using at this time  . HTN (hypertension)    x 3 years  . Hyperlipidemia   . Lung nodules 03/11/2013  . Mediastinal adenopathy 06/20/2013   CT  & PET 2/15   . 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 NHL (non-Hodgkin's lymphoma) ([place])    nhl dx 9/11  . Obesity    exogenous  . Shingles   . Sleep apnea    recent study 04-07-14 awaiting results, no cpap yet.-Dr. Glynn Octave   Medications:  Prescriptions Prior to Admission  Medication Sig Dispense Refill Last Dose  . acetaminophen (TYLENOL) 500 MG tablet Take 1,000 mg by mouth every 6 (six) hours as needed for headache. Reported on 10/08/2015   unknown  . ALPRAZolam (XANAX) 0.25 MG tablet TAKE ONE TABLET BY MOUTH AT BEDTIME 30 tablet 2 unknown  . Cholecalciferol (VITAMIN D3) 5000 UNITS TABS  Take 1 tablet by mouth every morning.   unknown  . enalapril (VASOTEC) 20 MG tablet TAKE ONE TABLET BY MOUTH ONCE DAILY (Patient taking differently: TAKE ONE TABLET BY MOUTH TWICE DAILY) 30 tablet 5 unknown at Unknown time  . guaiFENesin (MUCINEX) 600 MG 12 hr tablet Take 600 mg by mouth 2 (two) times daily as needed for cough or to loosen phlegm.   Past Week at Unknown time  . insulin NPH Human (HUMULIN N,NOVOLIN N) 100 UNIT/ML injection Inject 35 Units into the skin daily before breakfast.    unknown  . insulin regular (NOVOLIN R,HUMULIN R) 250 units/2.72m (100 units/mL) injection Inject 10 Units into the skin 2 (two) times daily before a meal. States he use regular insulin needle and he only use this insulin only as needed as back up if Novolin R does not work     . ipratropium-albuterol (DUONEB) 0.5-2.5 (3) MG/3ML SOLN PLACE 1 VIAL (3 MLS) IN THE NEBULIZER EVERY 6 HOURS AS NEEDED 1080 mL 0 Past Week at Unknown time  . metFORMIN (GLUCOPHAGE) 500 MG tablet TAKE ONE TABLET BY MOUTH IN THE MORNING AND TAKE  TWO TABLETS IN THE EVENING 270 tablet 0 unknown  . metoprolol (LOPRESSOR) 100 MG tablet TAKE ONE TABLET BY MOUTH TWICE DAILY 180 tablet 1 unknown  . NOVOLIN R RELION 100 UNIT/ML injection INJECT 32 UNITS  SUBCUTANEOUSLY TWICE A DAY BEFORE A MEAL 20 mL 2 unknown  . simvastatin (ZOCOR) 40 MG tablet TAKE ONE TABLET BY MOUTH ONCE DAILY 90 tablet 0 unknown  . warfarin (COUMADIN) 5 MG tablet TAKE ONE & ONE-HALF TABLETS BY MOUTH ONCE DAILY EVERY MONDAY AND EVERY FRIDAY THEN TAKE ONE  TABLET THE REST OF THE WEEK 140 tablet 2 unknown  . albuterol (PROAIR HFA) 108 (90 BASE) MCG/ACT inhaler Inhale 2 puffs into the lungs every 6 (six) hours as needed for wheezing or shortness of breath. 8.5 g 5 Taking  . Blood Glucose Calibration (TAI DOC CONTROL) NORMAL SOLN Use as directed.   Taking  . Blood Glucose Monitoring Suppl (CLEVER CHEK AUTO-CODE VOICE) DEVI USE AS DIRECTED 1 each 2 Taking  . Lancet Devices (LANCING  DEVICE) MISC USE AS DIRECTED 1 each 2 Taking   Assessment: 65yo male presented to ED unresponsive and with low blood sugar.  Asked to resume home Warfarin. INR 2.34 today  Goal of Therapy:  INR 2-3 Monitor platelets by anticoagulation protocol: Yes  Plan:  Coumadin  6m x 1 today PT/INR daily Monitor for S/S of bleeding  LExcell SeltzerPharmD Clinical Pharmacist 04/04/2016

## 2016-04-04 NOTE — Progress Notes (Signed)
Last neb treatment held  To allow patient to sleep.Justin Ruiz

## 2016-04-04 NOTE — Progress Notes (Signed)
Subjective: He says he feels okay. He had significant confusion last night. No complaints now and he seems alert and oriented at this point. No chest pain. No PND or orthopnea.  Objective: Vital signs in last 24 hours: Temp:  [97.3 F (36.3 C)-98.9 F (37.2 C)] 97.3 F (36.3 C) (12/11 0400) Pulse Rate:  [61-93] 71 (12/11 0700) Resp:  [13-24] 15 (12/11 0700) BP: (86-137)/(48-115) 89/78 (12/11 0700) SpO2:  [92 %-98 %] 94 % (12/11 0743) Weight:  [133.7 kg (294 lb 12.1 oz)] 133.7 kg (294 lb 12.1 oz) (12/11 0456) Weight change: -1.9 kg (-4 lb 3 oz) Last BM Date: 04/03/16  Intake/Output from previous day: 12/10 0701 - 12/11 0700 In: 720 [P.O.:720] Out: 2750 [Urine:2750]  PHYSICAL EXAM General appearance: alert, cooperative, no distress and morbidly obese Resp: clear to auscultation bilaterally Cardio: He is in atrial fib with well-controlled ventricular response GI: soft, non-tender; bowel sounds normal; no masses,  no organomegaly Extremities: extremities normal, atraumatic, no cyanosis or edema Skin warm and dry. Pupils react. Mucous membranes are moist.  Lab Results:  Results for orders placed or performed during the hospital encounter of 03/28/16 (from the past 48 hour(s))  Glucose, capillary     Status: Abnormal   Collection Time: 04/02/16 10:50 AM  Result Value Ref Range   Glucose-Capillary 239 (H) 65 - 99 mg/dL  Glucose, capillary     Status: Abnormal   Collection Time: 04/02/16  4:48 PM  Result Value Ref Range   Glucose-Capillary 363 (H) 65 - 99 mg/dL  Glucose, capillary     Status: Abnormal   Collection Time: 04/02/16  9:28 PM  Result Value Ref Range   Glucose-Capillary 300 (H) 65 - 99 mg/dL   Comment 1 Notify RN   Protime-INR     Status: Abnormal   Collection Time: 04/03/16  4:21 AM  Result Value Ref Range   Prothrombin Time 23.9 (H) 11.4 - 15.2 seconds   INR 2.97   Basic metabolic panel     Status: Abnormal   Collection Time: 04/03/16  4:21 AM  Result Value  Ref Range   Sodium 137 135 - 145 mmol/Ruiz   Potassium 3.8 3.5 - 5.1 mmol/Ruiz   Chloride 94 (Ruiz) 101 - 111 mmol/Ruiz   CO2 34 (H) 22 - 32 mmol/Ruiz   Glucose, Bld 144 (H) 65 - 99 mg/dL   BUN 31 (H) 6 - 20 mg/dL   Creatinine, Ser 0.85 0.61 - 1.24 mg/dL   Calcium 8.8 (Ruiz) 8.9 - 10.3 mg/dL   GFR calc non Af Amer >60 >60 mL/min   GFR calc Af Amer >60 >60 mL/min    Comment: (NOTE) The eGFR has been calculated using the CKD EPI equation. This calculation has not been validated in all clinical situations. eGFR's persistently <60 mL/min signify possible Chronic Kidney Disease.    Anion gap 9 5 - 15  Magnesium     Status: None   Collection Time: 04/03/16  4:21 AM  Result Value Ref Range   Magnesium 1.7 1.7 - 2.4 mg/dL  Glucose, capillary     Status: Abnormal   Collection Time: 04/03/16  8:44 AM  Result Value Ref Range   Glucose-Capillary 234 (H) 65 - 99 mg/dL   Comment 1 Notify RN    Comment 2 Document in Chart   Glucose, capillary     Status: Abnormal   Collection Time: 04/03/16 12:21 PM  Result Value Ref Range   Glucose-Capillary 274 (H) 65 - 99 mg/dL  Glucose, capillary     Status: Abnormal   Collection Time: 04/03/16  4:10 PM  Result Value Ref Range   Glucose-Capillary 209 (H) 65 - 99 mg/dL   Comment 1 Notify RN    Comment 2 Document in Chart   Glucose, capillary     Status: Abnormal   Collection Time: 04/03/16  7:49 PM  Result Value Ref Range   Glucose-Capillary 218 (H) 65 - 99 mg/dL  Protime-INR     Status: Abnormal   Collection Time: 04/04/16  4:44 AM  Result Value Ref Range   Prothrombin Time 26.1 (H) 11.4 - 15.2 seconds   INR 2.34   Glucose, capillary     Status: None   Collection Time: 04/04/16  7:42 AM  Result Value Ref Range   Glucose-Capillary 90 65 - 99 mg/dL   Comment 1 Notify RN    Comment 2 Document in Chart     ABGS No results for input(s): PHART, PO2ART, TCO2, HCO3 in the last 72 hours.  Invalid input(s): PCO2 CULTURES Recent Results (from the past 240  hour(s))  Blood Culture (routine x 2)     Status: None   Collection Time: 03/28/16 11:01 AM  Result Value Ref Range Status   Specimen Description BLOOD DRAWN BY IV THERAPY  Final   Special Requests BOTTLES DRAWN AEROBIC AND ANAEROBIC 6 CC EACH  Final   Culture NO GROWTH 5 DAYS  Final   Report Status 04/02/2016 FINAL  Final  Blood Culture (routine x 2)     Status: None   Collection Time: 03/28/16 11:17 AM  Result Value Ref Range Status   Specimen Description BLOOD RIGHT WRIST  Final   Special Requests BOTTLES DRAWN AEROBIC AND ANAEROBIC 5 CC EACH  Final   Culture NO GROWTH 5 DAYS  Final   Report Status 04/02/2016 FINAL  Final  Urine culture     Status: None   Collection Time: 03/28/16 11:29 AM  Result Value Ref Range Status   Specimen Description URINE, CATHETERIZED  Final   Special Requests NONE  Final   Culture NO GROWTH Performed at [facility]   Final   Report Status 03/29/2016 FINAL  Final  MRSA PCR Screening     Status: None   Collection Time: 03/28/16  4:12 PM  Result Value Ref Range Status   MRSA by PCR NEGATIVE NEGATIVE Final    Comment:        The GeneXpert MRSA Assay (FDA approved for NASAL specimens only), is one component of a comprehensive MRSA colonization surveillance program. It is not intended to diagnose MRSA infection nor to guide or monitor treatment for MRSA infections.   MRSA PCR Screening     Status: None   Collection Time: 04/01/16  9:40 AM  Result Value Ref Range Status   MRSA by PCR NEGATIVE NEGATIVE Final    Comment:        The GeneXpert MRSA Assay (FDA approved for NASAL specimens only), is one component of a comprehensive MRSA colonization surveillance program. It is not intended to diagnose MRSA infection nor to guide or monitor treatment for MRSA infections.    Studies/Results: No results found.  Medications:  Prior to Admission:  Prescriptions Prior to Admission  Medication Sig Dispense Refill Last Dose  .  acetaminophen (TYLENOL) 500 MG tablet Take 1,000 mg by mouth every 6 (six) hours as needed for headache. Reported on 10/08/2015   unknown  . ALPRAZolam (XANAX) 0.25 MG tablet TAKE ONE TABLET  BY MOUTH AT BEDTIME 30 tablet 2 unknown  . Cholecalciferol (VITAMIN D3) 5000 UNITS TABS Take 1 tablet by mouth every morning.   unknown  . enalapril (VASOTEC) 20 MG tablet TAKE ONE TABLET BY MOUTH ONCE DAILY (Patient taking differently: TAKE ONE TABLET BY MOUTH TWICE DAILY) 30 tablet 5 unknown at Unknown time  . guaiFENesin (MUCINEX) 600 MG 12 hr tablet Take 600 mg by mouth 2 (two) times daily as needed for cough or to loosen phlegm.   Past Week at Unknown time  . insulin NPH Human (HUMULIN N,NOVOLIN N) 100 UNIT/ML injection Inject 35 Units into the skin daily before breakfast.    unknown  . insulin regular (NOVOLIN R,HUMULIN R) 250 units/2.2m (100 units/mL) injection Inject 10 Units into the skin 2 (two) times daily before a meal. States he use regular insulin needle and he only use this insulin only as needed as back up if Novolin R does not work     . ipratropium-albuterol (DUONEB) 0.5-2.5 (3) MG/3ML SOLN PLACE 1 VIAL (3 MLS) IN THE NEBULIZER EVERY 6 HOURS AS NEEDED 1080 mL 0 Past Week at Unknown time  . metFORMIN (GLUCOPHAGE) 500 MG tablet TAKE ONE TABLET BY MOUTH IN THE MORNING AND TAKE  TWO TABLETS IN THE EVENING 270 tablet 0 unknown  . metoprolol (LOPRESSOR) 100 MG tablet TAKE ONE TABLET BY MOUTH TWICE DAILY 180 tablet 1 unknown  . NOVOLIN R RELION 100 UNIT/ML injection INJECT 32 UNITS SUBCUTANEOUSLY TWICE A DAY BEFORE A MEAL 20 mL 2 unknown  . simvastatin (ZOCOR) 40 MG tablet TAKE ONE TABLET BY MOUTH ONCE DAILY 90 tablet 0 unknown  . warfarin (COUMADIN) 5 MG tablet TAKE ONE & ONE-HALF TABLETS BY MOUTH ONCE DAILY EVERY MONDAY AND EVERY FRIDAY THEN TAKE ONE  TABLET THE REST OF THE WEEK 140 tablet 2 unknown  . albuterol (PROAIR HFA) 108 (90 BASE) MCG/ACT inhaler Inhale 2 puffs into the lungs every 6 (six) hours  as needed for wheezing or shortness of breath. 8.5 g 5 Taking  . Blood Glucose Calibration (TAI DOC CONTROL) NORMAL SOLN Use as directed.   Taking  . Blood Glucose Monitoring Suppl (CLEVER CHEK AUTO-CODE VOICE) DEVI USE AS DIRECTED 1 each 2 Taking  . Lancet Devices (LANCING DEVICE) MISC USE AS DIRECTED 1 each 2 Taking   Scheduled: . furosemide  40 mg Oral Daily  . insulin aspart  0-12 Units Subcutaneous QHS  . insulin aspart  0-20 Units Subcutaneous TID WC  . insulin aspart  10 Units Subcutaneous TID WC  . insulin glargine  45 Units Subcutaneous Daily  . ipratropium-albuterol  3 mL Nebulization TID  . mouth rinse  15 mL Mouth Rinse BID  . metoprolol  100 mg Oral BID  . predniSONE  30 mg Oral Q breakfast  . simvastatin  40 mg Oral q1800  . sodium chloride flush  3 mL Intravenous Q12H  . Warfarin - Pharmacist Dosing Inpatient   Does not apply Q24H   Continuous:  PYKZ:LDJTTSchloride, acetaminophen, ipratropium-albuterol, ondansetron (ZOFRAN) IV, sodium chloride flush  Assesment: He was admitted with encephalopathy which is multifactorial. He has community-acquired pneumonia and is finishing antibiotics. At baseline he has COPD and is having an exacerbation. He has obstructive sleep apnea on CPAP at home but he is not always compliant with that. He has acute on chronic hypoxic/hypercapnic respiratory failure and that has improved. He still has some confusion but I don't think that's necessarily related to his respiratory failure but probably more  to acute illness and being out of his normal environment. 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 I will plan to follow more peripherally. Thanks for allowing me to see him with you    LOS: 7 days   Justin Ruiz 04/04/2016, 7:56 AM

## 2016-04-05 ENCOUNTER — Telehealth: Payer: Self-pay | Admitting: Family Medicine

## 2016-04-05 DIAGNOSIS — M1990 Unspecified osteoarthritis, unspecified site: Secondary | ICD-10-CM | POA: Diagnosis not present

## 2016-04-05 DIAGNOSIS — I5031 Acute diastolic (congestive) heart failure: Secondary | ICD-10-CM | POA: Diagnosis not present

## 2016-04-05 DIAGNOSIS — C8512 Unspecified B-cell lymphoma, intrathoracic lymph nodes: Secondary | ICD-10-CM | POA: Diagnosis not present

## 2016-04-05 DIAGNOSIS — J449 Chronic obstructive pulmonary disease, unspecified: Secondary | ICD-10-CM | POA: Diagnosis not present

## 2016-04-05 DIAGNOSIS — I4891 Unspecified atrial fibrillation: Secondary | ICD-10-CM | POA: Diagnosis not present

## 2016-04-05 DIAGNOSIS — E1165 Type 2 diabetes mellitus with hyperglycemia: Secondary | ICD-10-CM | POA: Diagnosis not present

## 2016-04-05 DIAGNOSIS — G4733 Obstructive sleep apnea (adult) (pediatric): Secondary | ICD-10-CM | POA: Diagnosis not present

## 2016-04-05 DIAGNOSIS — I110 Hypertensive heart disease with heart failure: Secondary | ICD-10-CM | POA: Diagnosis not present

## 2016-04-05 NOTE — Telephone Encounter (Signed)
appt scheduled Pt notified 

## 2016-04-06 ENCOUNTER — Other Ambulatory Visit: Payer: Self-pay | Admitting: Family Medicine

## 2016-04-06 ENCOUNTER — Telehealth: Payer: Self-pay | Admitting: Internal Medicine

## 2016-04-06 NOTE — Telephone Encounter (Signed)
Patient's wife called to have appointments rescheduled to January. Patient wasn't feeling well.

## 2016-04-07 ENCOUNTER — Ambulatory Visit: Payer: Commercial Managed Care - HMO | Admitting: Internal Medicine

## 2016-04-08 ENCOUNTER — Encounter: Payer: Self-pay | Admitting: Family Medicine

## 2016-04-08 ENCOUNTER — Ambulatory Visit (INDEPENDENT_AMBULATORY_CARE_PROVIDER_SITE_OTHER): Payer: Commercial Managed Care - HMO | Admitting: Family Medicine

## 2016-04-08 VITALS — BP 119/70 | HR 64 | Temp 96.6°F | Ht 72.0 in | Wt 295.0 lb

## 2016-04-08 DIAGNOSIS — J441 Chronic obstructive pulmonary disease with (acute) exacerbation: Secondary | ICD-10-CM

## 2016-04-08 DIAGNOSIS — I482 Chronic atrial fibrillation, unspecified: Secondary | ICD-10-CM

## 2016-04-08 DIAGNOSIS — J189 Pneumonia, unspecified organism: Secondary | ICD-10-CM

## 2016-04-08 DIAGNOSIS — I509 Heart failure, unspecified: Secondary | ICD-10-CM | POA: Diagnosis not present

## 2016-04-08 DIAGNOSIS — C8332 Diffuse large B-cell lymphoma, intrathoracic lymph nodes: Secondary | ICD-10-CM | POA: Diagnosis not present

## 2016-04-08 DIAGNOSIS — Z09 Encounter for follow-up examination after completed treatment for conditions other than malignant neoplasm: Secondary | ICD-10-CM | POA: Diagnosis not present

## 2016-04-08 DIAGNOSIS — R410 Disorientation, unspecified: Secondary | ICD-10-CM

## 2016-04-08 DIAGNOSIS — Z7901 Long term (current) use of anticoagulants: Secondary | ICD-10-CM | POA: Diagnosis not present

## 2016-04-08 LAB — COAGUCHEK XS/INR WAIVED
INR: 3.1 — AB (ref 0.9–1.1)
Prothrombin Time: 37.6 s

## 2016-04-08 MED ORDER — FUROSEMIDE 40 MG PO TABS: 40.0000 mg | ORAL_TABLET | Freq: Every day | ORAL | 6 refills | Status: DC

## 2016-04-08 MED ORDER — METOPROLOL TARTRATE 100 MG PO TABS: 100.0000 mg | ORAL_TABLET | Freq: Two times a day (BID) | ORAL | 3 refills | Status: DC

## 2016-04-08 MED ORDER — ALPRAZOLAM 0.25 MG PO TABS: 0.2500 mg | ORAL_TABLET | Freq: Every day | ORAL | 5 refills | Status: DC

## 2016-04-08 NOTE — Progress Notes (Signed)
Subjective:    Patient ID: Justin Ruiz, male    DOB: [birth year], 65 y.o.   MRN: [id number]  HPI Patient here today for hospital follow up from [facility]. He was diagnosed with pneumonia. He is accompanied today by his wife and sister.The hospital discharge summary was reviewed. The patient was recently discharged with a diagnosis of acute on chronic hypoxic hypercapnic respiratory failure likely related to congestive heart failure and pneumonia. During this time he also had atrial fibrillation with rapid ventricular response. The blood sugar level was fluctuating. He did have a CT scan of his head and this was negative. He at that time had some decline in mental status but this is improved by the time he was discharged from the hospital. His magnesium level was also low. The family comes with him to the visit today and they're concerned about his memory which seems to have declined drastically since this hospitalization.Patient today denies any chest pain shortness of breath trouble swallowing eating or problems with his GI tract including nausea vomiting diarrhea or blood in the stool. He says he's passing his water well. The family is concerned that he has to be at home for some time at night and no one is there. He is somewhat phobic about the episode happened and will not sleep in the bed because of this. His sister has volunteered to stay there some while his wife is working at nighttime for a while longer in hopes that his mental status will improve.    Patient Active Problem List   Diagnosis Date Noted  . Hypomagnesemia   . Morbid obesity ([place]) 03/29/2016  . Hypoglycemia 03/28/2016  . Acute encephalopathy 03/28/2016  . Acute diastolic heart failure ([place]) 03/28/2016  . COPD (chronic obstructive pulmonary disease) ([place]) 03/28/2016  . Atrial fibrillation with RVR ([place]) 03/28/2016  . Acute on chronic respiratory failure ([place]) 03/28/2016  . Diffuse large B-cell lymphoma of intrathoracic  lymph nodes ([place]) 10/08/2015  . 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 Chronic diastolic heart failure ([place])   . CAP (community acquired pneumonia)   . Acute-on-chronic respiratory failure ([place]) 06/07/2015  . Sepsis ([place]) 06/07/2015  . Diabetic neuropathy, type II diabetes mellitus ([place]) 09/25/2014  . Chronic respiratory failure with hypoxia ([place]) 05/25/2014  . Obstructive sleep apnea 05/25/2014  . Benign neoplasm of ascending colon 05/05/2014  . Excessive daytime sleepiness 01/24/2014  . Mediastinal adenopathy 06/20/2013  . Severe obesity (BMI >= 40) ([place]) 05/01/2013  . Generalized anxiety disorder 05/01/2013  . Lung nodules 03/11/2013  . Chronic anticoagulation 08/20/2012  . FIBRILLATION, ATRIAL 05/12/2010  . NHL (non-Hodgkin's lymphoma) ([place]) 01/26/2010  . Obesity, morbid ([place]) 04/27/2009  . CARDIOVASCULAR STUDIES, ABNORMAL 04/27/2009  . ABNORMAL STRESS ELECTROCARDIOGRAM 03/25/2009  . PERSONAL HISTORY OF COLONIC POLYPS 03/02/2009  . Diabetes mellitus type 2, insulin dependent ([place]) 04/29/2007  . Hyperlipidemia 04/29/2007  . Hypertension 04/29/2007  . Seasonal and perennial allergic rhinitis 04/29/2007  . COPD exacerbation ([place]) 04/29/2007  . G E R D 04/29/2007  . BENIGN PROSTATIC HYPERTROPHY, HX OF 04/29/2007   Outpatient Encounter Prescriptions as of 04/08/2016  Medication Sig  . acetaminophen (TYLENOL) 500 MG tablet Take 1,000 mg by mouth every 6 (six) hours as needed for headache. Reported on 10/08/2015  . albuterol (PROAIR HFA) 108 (90 Base) MCG/ACT inhaler Inhale 2 puffs into the lungs every 6 (six) hours as needed for wheezing or shortness of breath.  . ALPRAZolam (XANAX) 0.25 MG tablet Take 1 tablet (  0.25 mg total) by mouth at bedtime.  . Cholecalciferol (VITAMIN D3) 5000 UNITS TABS Take 1 tablet by mouth every morning.  . furosemide (LASIX) 40 MG tablet Take 1 tablet (40 mg total) by mouth daily.  Marland Kitchen guaiFENesin (MUCINEX) 600 MG 12 hr tablet  Take 600 mg by mouth 2 (two) times daily as needed for cough or to loosen phlegm.  . insulin NPH Human (HUMULIN N,NOVOLIN N) 100 UNIT/ML injection Inject 0.45 mLs (45 Units total) into the skin daily before breakfast.  . insulin regular (NOVOLIN R,HUMULIN R) 250 units/2.23m (100 units/mL) injection Inject 0.1 mLs (10 Units total) into the skin 3 (three) times daily before meals. States he use regular insulin needle and he only use this insulin only as needed as back up if Novolin R does not work  . ipratropium-albuterol (DUONEB) 0.5-2.5 (3) MG/3ML SOLN PLACE 1 VIAL (3 MLS) IN THE NEBULIZER EVERY 6 HOURS AS NEEDED  . Lancet Devices (LANCING DEVICE) MISC USE AS DIRECTED  . metFORMIN (GLUCOPHAGE) 500 MG tablet TAKE ONE TABLET BY MOUTH IN THE MORNING AND TAKE  TWO TABLETS IN THE EVENING  . metoprolol (LOPRESSOR) 100 MG tablet Take 1 tablet (100 mg total) by mouth 2 (two) times daily.  . predniSONE (DELTASONE) 10 MG tablet Take 30 mg daily for 3 days, then 20 mg for 3 days, then 10 mg for 3 days and stop.  . simvastatin (ZOCOR) 40 MG tablet TAKE ONE TABLET BY MOUTH ONCE DAILY  . warfarin (COUMADIN) 5 MG tablet TAKE ONE & ONE-HALF TABLETS BY MOUTH ONCE DAILY EVERY MONDAY AND EVERY FRIDAY THEN TAKE ONE  TABLET THE REST OF THE WEEK  . [DISCONTINUED] ALPRAZolam (XANAX) 0.25 MG tablet TAKE ONE TABLET BY MOUTH AT BEDTIME  . [DISCONTINUED] furosemide (LASIX) 40 MG tablet Take 1 tablet (40 mg total) by mouth daily.  . [DISCONTINUED] metoprolol (LOPRESSOR) 100 MG tablet TAKE ONE TABLET BY MOUTH TWICE DAILY  . Blood Glucose Calibration (TAI DOC CONTROL) NORMAL SOLN Use as directed.  . Blood Glucose Monitoring Suppl (CLEVER CHEK AUTO-CODE VOICE) DEVI USE AS DIRECTED (Patient not taking: Reported on 04/08/2016)   No facility-administered encounter medications on file as of 04/08/2016.      Review of Systems  Constitutional: Positive for fatigue. Negative for fever.  HENT: Negative.   Eyes: Negative.     Respiratory: Negative.  Negative for cough (better).   Cardiovascular: Negative.   Gastrointestinal: Negative.   Endocrine: Negative.   Genitourinary: Negative.   Musculoskeletal: Negative.   Skin: Negative.   Allergic/Immunologic: Negative.   Neurological: Positive for weakness.  Hematological: Negative.   Psychiatric/Behavioral: Negative.        Memory changed since released from [facility]       Objective:   Physical Exam  Constitutional: He is oriented to person, place, and time. He appears well-developed and well-nourished. No distress.  The patient is pleasant and alert and calm and does not appear to be in any acute distress. When asked about the recent hospitalization he does not remember anything about that.  HENT:  Head: Normocephalic and atraumatic.  Right Ear: External ear normal.  Left Ear: External ear normal.  Mouth/Throat: Oropharynx is clear and moist. No oropharyngeal exudate.  Nasal congestion and turbinate swelling bilaterally  Eyes: Conjunctivae and EOM are normal. Pupils are equal, round, and reactive to light. Right eye exhibits no discharge. Left eye exhibits no discharge. No scleral icterus.  Neck: Normal range of motion. Neck supple. No thyromegaly present.  No bruits thyromegaly or anterior cervical adenopathy  Cardiovascular: Normal rate, regular rhythm and normal heart sounds.   No murmur heard. The heart today appeared to have a regular rate and rhythm at about 72/m.  Pulmonary/Chest: Effort normal and breath sounds normal. No respiratory distress. He has no wheezes. He has no rales.  Clear anteriorly and posteriorly  Abdominal: Soft. Bowel sounds are normal. He exhibits no mass. There is no tenderness. There is no rebound and no guarding.  The abdomen was obese but soft without masses tenderness or organ enlargement or bruits  Musculoskeletal: Normal range of motion. He exhibits no edema.  Lymphadenopathy:    He has no cervical adenopathy.  Neurological:  He is alert and oriented to person, place, and time. He has normal reflexes. No cranial nerve deficit.  Skin: Skin is warm and dry. No rash noted.  Psychiatric: He has a normal mood and affect. His behavior is normal. Judgment and thought content normal.  Nursing note and vitals reviewed.  BP 119/70 (BP Location: Left Arm)   Pulse 64   Temp (!) 96.6 F (35.9 C) (Oral)   Ht 6' (1.829 m)   Wt 295 lb (133.8 kg)   SpO2 96%   BMI 40.01 kg/m         Assessment & Plan:  1. Hospital discharge follow-up -Patient was apparently quite sick in the hospital with low blood sugar and community acquired pneumonia and congestive heart failure. He appears to me today like his usual self. He does not remember things from that episode. His wife indicates that the confusion is much worse at home with things that he used to be eligible to do that he is not able to do any more like cooking his food drawing up his insulin. - BMP8+EGFR - CBC with Differential/Platelet - Hepatic function panel - CoaguChek XS/INR Waived - Thyroid Panel With TSH - Vitamin B12 - Ambulatory referral to Neurology - Magnesium - Ambulatory referral to Cardiology  2. Confusion -Follow-up with neurology as planned - Thyroid Panel With TSH - Vitamin B12 - Ambulatory referral to Neurology - Magnesium - Ambulatory referral to Cardiology  3. Congestive heart failure, unspecified congestive heart failure chronicity, unspecified congestive heart failure type [facility]) -Stay on current medicines and follow-up with cardiology as planned - Ambulatory referral to Cardiology  4. Community acquired pneumonia, unspecified laterality -The chest is clear today and we will wait for about 4 weeks and repeat a chest x-ray at that time. - Ambulatory referral to Cardiology  5. COPD exacerbation ([place]) -The lungs were clear today and the patient will continue with his usual respiratory treatments. - Ambulatory referral to Cardiology  6.  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 Chronic atrial fibrillation (HCC) -The patient appeared to be in normal sinus rhythm today.  8. Chronic anticoagulation -The INR was 3.1 and this was adjusted by the clinical pharmacist.  Meds ordered this encounter  Medications  . metoprolol (LOPRESSOR) 100 MG tablet    Sig: Take 1 tablet (100 mg total) by mouth 2 (two) times daily.    Dispense:  180 tablet    Refill:  3  . ALPRAZolam (XANAX) 0.25 MG tablet    Sig: Take 1 tablet (0.25 mg total) by mouth at bedtime.    Dispense:  30 tablet    Refill:  5  . furosemide (LASIX) 40 MG tablet    Sig: Take 1 tablet (40 mg total) by  mouth daily.    Dispense:  30 tablet    Refill:  6   Patient Instructions   We will arrange for the patient to have a visit for follow-up with his cardiologist as soon as possible because of this history of congestive heart failure and hospitalization We will also arrange for him to have an appointment with a neurologist for follow-up because of his continued episodes of confusion following this hospitalization with a low blood sugar and community acquired pneumonia. The patient's wife and sister were with him today and said that his memory is just not what it used to be since this hospitalization. The CT scan as noted was normal.  Anticoagulation Dose Instructions as of 04/08/2016      Dorene Grebe Tue Wed Thu Fri Sat   New Dose 5 mg 7.5 mg 5 mg 5 mg 5 mg 5 mg 5 mg    Description   Take 1 tablet today and Monday instead of 1 1/2 tablets and then resume regular schedule.  Have a high vitamin K food today and tomorrow.  Prednisone is most likely the cause of the increased INR  INR today is a little thin a 3.1       Arrie Senate MD

## 2016-04-08 NOTE — Patient Instructions (Addendum)
We will arrange for the patient to have a visit for follow-up with his cardiologist as soon as possible because of this history of congestive heart failure and hospitalization We will also arrange for him to have an appointment with a neurologist for follow-up because of his continued episodes of confusion following this hospitalization with a low blood sugar and community acquired pneumonia. The patient's wife and sister were with him today and said that his memory is just not what it used to be since this hospitalization. The CT scan as noted was normal.  Anticoagulation Dose Instructions as of 04/08/2016      Dorene Grebe Tue Wed Thu Fri Sat   New Dose 5 mg 7.5 mg 5 mg 5 mg 5 mg 5 mg 5 mg    Description   Take 1 tablet today and Monday instead of 1 1/2 tablets and then resume regular schedule.  Have a high vitamin K food today and tomorrow.  Prednisone is most likely the cause of the increased INR  INR today is a little thin a 3.1

## 2016-04-09 LAB — VITAMIN B12: Vitamin B-12: 575 pg/mL (ref 232–1245)

## 2016-04-09 LAB — HEPATIC FUNCTION PANEL
ALBUMIN: 3.5 g/dL — AB (ref 3.6–4.8)
ALT: 40 IU/L (ref 0–44)
AST: 29 IU/L (ref 0–40)
Alkaline Phosphatase: 101 IU/L (ref 39–117)
BILIRUBIN TOTAL: 1.5 mg/dL — AB (ref 0.0–1.2)
BILIRUBIN, DIRECT: 0.39 mg/dL (ref 0.00–0.40)
TOTAL PROTEIN: 5.9 g/dL — AB (ref 6.0–8.5)

## 2016-04-09 LAB — BMP8+EGFR
BUN/Creatinine Ratio: 33 — ABNORMAL HIGH (ref 10–24)
BUN: 40 mg/dL — AB (ref 8–27)
CALCIUM: 9.5 mg/dL (ref 8.6–10.2)
CHLORIDE: 93 mmol/L — AB (ref 96–106)
CO2: 27 mmol/L (ref 18–29)
Creatinine, Ser: 1.23 mg/dL (ref 0.76–1.27)
GFR calc Af Amer: 71 mL/min/{1.73_m2} (ref >59)
GFR calc non Af Amer: 61 mL/min/{1.73_m2} (ref >59)
Glucose: 295 mg/dL — ABNORMAL HIGH (ref 65–99)
POTASSIUM: 4.8 mmol/L (ref 3.5–5.2)
Sodium: 138 mmol/L (ref 134–144)

## 2016-04-09 LAB — CBC WITH DIFFERENTIAL/PLATELET
BASOS ABS: 0 10*3/uL (ref 0.0–0.2)
Basos: 0 %
EOS (ABSOLUTE): 0.1 10*3/uL (ref 0.0–0.4)
Eos: 1 %
HEMOGLOBIN: 15.9 g/dL (ref 13.0–17.7)
Hematocrit: 49.4 % (ref 37.5–51.0)
IMMATURE GRANS (ABS): 0 10*3/uL (ref 0.0–0.1)
IMMATURE GRANULOCYTES: 0 %
Lymphocytes Absolute: 1.8 10*3/uL (ref 0.7–3.1)
Lymphs: 12 %
MCH: 30.3 pg (ref 26.6–33.0)
MCHC: 32.2 g/dL (ref 31.5–35.7)
MCV: 94 fL (ref 79–97)
MONOCYTES: 4 %
Monocytes Absolute: 0.7 10*3/uL (ref 0.1–0.9)
NEUTROS PCT: 83 %
Neutrophils Absolute: 12.7 10*3/uL — ABNORMAL HIGH (ref 1.4–7.0)
PLATELETS: 239 10*3/uL (ref 150–379)
RBC: 5.24 x10E6/uL (ref 4.14–5.80)
RDW: 13.5 % (ref 12.3–15.4)
WBC: 15.2 10*3/uL — ABNORMAL HIGH (ref 3.4–10.8)

## 2016-04-09 LAB — MAGNESIUM: MAGNESIUM: 1.3 mg/dL — AB (ref 1.6–2.3)

## 2016-04-09 LAB — THYROID PANEL WITH TSH
FREE THYROXINE INDEX: 1.9 (ref 1.2–4.9)
T3 UPTAKE RATIO: 31 % (ref 24–39)
T4 TOTAL: 6.1 ug/dL (ref 4.5–12.0)
TSH: 1.49 u[IU]/mL (ref 0.450–4.500)

## 2016-04-10 DIAGNOSIS — J449 Chronic obstructive pulmonary disease, unspecified: Secondary | ICD-10-CM | POA: Diagnosis not present

## 2016-04-10 DIAGNOSIS — J9611 Chronic respiratory failure with hypoxia: Secondary | ICD-10-CM | POA: Diagnosis not present

## 2016-04-10 DIAGNOSIS — J439 Emphysema, unspecified: Secondary | ICD-10-CM | POA: Diagnosis not present

## 2016-04-11 ENCOUNTER — Other Ambulatory Visit: Payer: Self-pay | Admitting: *Deleted

## 2016-04-11 DIAGNOSIS — R7989 Other specified abnormal findings of blood chemistry: Secondary | ICD-10-CM

## 2016-04-11 DIAGNOSIS — D72829 Elevated white blood cell count, unspecified: Secondary | ICD-10-CM

## 2016-04-11 DIAGNOSIS — R945 Abnormal results of liver function studies: Principal | ICD-10-CM

## 2016-04-12 DIAGNOSIS — E1165 Type 2 diabetes mellitus with hyperglycemia: Secondary | ICD-10-CM | POA: Diagnosis not present

## 2016-04-12 DIAGNOSIS — C8512 Unspecified B-cell lymphoma, intrathoracic lymph nodes: Secondary | ICD-10-CM | POA: Diagnosis not present

## 2016-04-12 DIAGNOSIS — I110 Hypertensive heart disease with heart failure: Secondary | ICD-10-CM | POA: Diagnosis not present

## 2016-04-12 DIAGNOSIS — J449 Chronic obstructive pulmonary disease, unspecified: Secondary | ICD-10-CM | POA: Diagnosis not present

## 2016-04-12 DIAGNOSIS — I5031 Acute diastolic (congestive) heart failure: Secondary | ICD-10-CM | POA: Diagnosis not present

## 2016-04-12 DIAGNOSIS — I4891 Unspecified atrial fibrillation: Secondary | ICD-10-CM | POA: Diagnosis not present

## 2016-04-12 DIAGNOSIS — M1990 Unspecified osteoarthritis, unspecified site: Secondary | ICD-10-CM | POA: Diagnosis not present

## 2016-04-12 DIAGNOSIS — G4733 Obstructive sleep apnea (adult) (pediatric): Secondary | ICD-10-CM | POA: Diagnosis not present

## 2016-04-13 ENCOUNTER — Encounter: Payer: Self-pay | Admitting: Neurology

## 2016-04-13 ENCOUNTER — Ambulatory Visit (INDEPENDENT_AMBULATORY_CARE_PROVIDER_SITE_OTHER): Payer: Commercial Managed Care - HMO | Admitting: Neurology

## 2016-04-13 ENCOUNTER — Ambulatory Visit: Payer: Commercial Managed Care - HMO | Admitting: Pharmacist

## 2016-04-13 VITALS — BP 130/85 | HR 79 | Wt 296.0 lb

## 2016-04-13 DIAGNOSIS — R55 Syncope and collapse: Secondary | ICD-10-CM | POA: Diagnosis not present

## 2016-04-13 DIAGNOSIS — I4891 Unspecified atrial fibrillation: Secondary | ICD-10-CM | POA: Diagnosis not present

## 2016-04-13 DIAGNOSIS — C8332 Diffuse large B-cell lymphoma, intrathoracic lymph nodes: Secondary | ICD-10-CM | POA: Diagnosis not present

## 2016-04-13 DIAGNOSIS — R413 Other amnesia: Secondary | ICD-10-CM | POA: Diagnosis not present

## 2016-04-13 DIAGNOSIS — IMO0002 Reserved for concepts with insufficient information to code with codable children: Secondary | ICD-10-CM

## 2016-04-13 DIAGNOSIS — G4733 Obstructive sleep apnea (adult) (pediatric): Secondary | ICD-10-CM

## 2016-04-13 DIAGNOSIS — J9621 Acute and chronic respiratory failure with hypoxia: Secondary | ICD-10-CM

## 2016-04-13 NOTE — Progress Notes (Signed)
PATIENT: Justin Ruiz DOB: 1950/08/01  Chief Complaint  Patient presents with  . Memory Loss    MMSE 25/30 - 10 animals.  He is here with his wife, [name].  He was admitted to the hospital on 03/28/16 for hypoglycemia (blood glucose 25).  Feels his memory has not returned to baseline since this event.  Marland Kitchen PCP    Chipper Herb, MD     HISTORICAL  Justin Ruiz is a 65 years old right-handed male, accompanied by his wife, seen in refer by his primary care doctor Chipper Herb for evaluation of memory loss, initial evaluation was April 13 2016.  He had past medical history of hypertension, diabetes, insuline dependent, chronic atrial fibrillation, taking warfarin, he used to be a heavy smoker, COPD, nighttime home oxygen dependent for few years.  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  He has no family of memory loss. He is very sedentary, watch TV all day long, does simple house chore,  He was found unconsciousness, foaming coming out of his mouth when his wife came back from work on March 28 2016, glucose was 25, EMS was called, he received D50, blood gas showed evidence of respiratory acidosis, elevated PCO2, chest x-ray showed pneumonia, congestive heart failure, he was in rapid atrial fibrillation, was started on Cardizem infusion, he was also noted to be hypothermic, was treated with Levaquin, and steroid, there was also noted acute metabolic encephalopathy, significantly elevated glucose, A1c was 5.9,   He has large B-cell lymphoma in 2012, was treated with chemotherapy, yearly follow-up has been stable  He was noted to have memory loss since he was discharged from hospital in December 2017, initially he has trouble taking a shower, does not know how to cook, over the past few weeks, he has made some progress, he can cook better now, but still not back to baseline yet.  I reviewed  laboratory evaluation in December 2017, normal CBC, hemoglobin of 14.2, normal CMP, creatinine 0.84, negative PSA 1.1, vitamin D 44.5, normal liver functional tests, low normal B12 270, mildly decreased to TSH 0.325   REVIEW OF SYSTEMS: Full 14 system review of systems performed and notable only for memory loss, confusion  ALLERGIES: Allergies  Allergen Reactions  . Avapro [Irbesartan] Other (See Comments)    "doesnt sit right with me"--light headed  . Lipitor [Atorvastatin Calcium] Other (See Comments)    Leg pain  . Penicillins     Unknown reaction    HOME MEDICATIONS: Current Outpatient Prescriptions  Medication Sig Dispense Refill  . acetaminophen (TYLENOL) 500 MG tablet Take 1,000 mg by mouth every 6 (six) hours as needed for headache. Reported on 10/08/2015    . albuterol (PROAIR HFA) 108 (90 Base) MCG/ACT inhaler Inhale 2 puffs into the lungs every 6 (six) hours as needed for wheezing or shortness of breath. 8.5 g 5  . ALPRAZolam (XANAX) 0.25 MG tablet Take 1 tablet (0.25 mg total) by mouth at bedtime. 30 tablet 5  . Blood Glucose Calibration (TAI DOC CONTROL) NORMAL SOLN Use as directed.    . Blood Glucose Monitoring Suppl (CLEVER CHEK AUTO-CODE VOICE) DEVI USE AS DIRECTED 1 each 2  . Cholecalciferol (VITAMIN D3) 5000 UNITS TABS Take 1 tablet by mouth every morning.    . furosemide (LASIX) 40 MG tablet Take 1 tablet (40 mg total) by mouth daily. 30 tablet 6  .  guaiFENesin (MUCINEX) 600 MG 12 hr tablet Take 600 mg by mouth 2 (two) times daily as needed for cough or to loosen phlegm.    . insulin NPH Human (HUMULIN N,NOVOLIN N) 100 UNIT/ML injection Inject 0.45 mLs (45 Units total) into the skin daily before breakfast. 10 mL 0  . insulin regular (NOVOLIN R,HUMULIN R) 250 units/2.59m (100 units/mL) injection Inject 0.1 mLs (10 Units total) into the skin 3 (three) times daily before meals. States he use regular insulin needle and he only use this insulin only as needed as back up if  Novolin R does not work 10 mL 0  . ipratropium-albuterol (DUONEB) 0.5-2.5 (3) MG/3ML SOLN PLACE 1 VIAL (3 MLS) IN THE NEBULIZER EVERY 6 HOURS AS NEEDED 1080 mL 0  . Lancet Devices (LANCING DEVICE) MISC USE AS DIRECTED 1 each 2  . metFORMIN (GLUCOPHAGE) 500 MG tablet TAKE ONE TABLET BY MOUTH IN THE MORNING AND TAKE  TWO TABLETS IN THE EVENING 270 tablet 0  . metoprolol (LOPRESSOR) 100 MG tablet Take 1 tablet (100 mg total) by mouth 2 (two) times daily. 180 tablet 3  . predniSONE (DELTASONE) 10 MG tablet Take 30 mg daily for 3 days, then 20 mg for 3 days, then 10 mg for 3 days and stop. 18 tablet 0  . simvastatin (ZOCOR) 40 MG tablet TAKE ONE TABLET BY MOUTH ONCE DAILY 90 tablet 0  . warfarin (COUMADIN) 5 MG tablet TAKE ONE & ONE-HALF TABLETS BY MOUTH ONCE DAILY EVERY MONDAY AND EVERY FRIDAY THEN TAKE ONE  TABLET THE REST OF THE WEEK 140 tablet 2   No current facility-administered medications for this visit.     PAST MEDICAL HISTORY: Past Medical History:  Diagnosis Date  . Arthritis   . Asthma   . BPH (benign prostatic hypertrophy)   . Colon polyps   . COPD (chronic obstructive pulmonary disease) ([place]   . Diabetes mellitus   . GERD (gastroesophageal reflux disease)   . History of oxygen administration    occ. oxygen use at 2 l/m as needed- not using at this time  . HTN (hypertension)    x 3 years  . Hyperlipidemia   . Lung nodules 03/11/2013  . Mediastinal adenopathy 06/20/2013   CT  & PET 2/15   . Memory loss   . 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 NHL (non-Hodgkin's lymphoma) ([place]    nhl dx 9/11  . Obesity    exogenous  . Shingles   . Sleep apnea    recent study 04-07-14 awaiting results, no cpap yet.-Dr. CGlynn Octave   PAST SURGICAL HISTORY: Past Surgical History:  Procedure Laterality Date  . APPENDECTOMY    . COLONOSCOPY WITH PROPOFOL N/A 05/05/2014   Procedure: COLONOSCOPY WITH PROPOFOL;  Surgeon: RInda Castle MD;  Location: [facility]  ENDOSCOPY;  Service: Endoscopy;  Laterality: N/A;    FAMILY HISTORY: Family History  Problem Relation Age of Onset  . Liver cancer Mother   . Diabetes Mother   . Heart disease Mother   . Colon cancer Father   . Prostate cancer Father   . Colon polyps Father   . Pulmonary embolism Sister   . Diabetes Sister   . Heart attack Sister   . Aortic aneurysm Sister   . COPD Sister   . Heart disease Sister   . COPD Sister   . Heart disease Sister   . Diabetes Maternal Grandmother     SOCIAL HISTORY:  Social History  Social History  . Marital status: Married    Spouse name: N/A  . Number of children: 0  . Years of education: 12   Occupational History  . Disabled    Social History Main Topics  . Smoking status: Former Smoker    Packs/day: 2.00    Years: 40.00    Start date: 12/17/1968    Quit date: 11/14/2004  . Smokeless tobacco: Never Used     Comment: 2 ppd   . Alcohol use No     Comment: previous  . Drug use: No  . Sexual activity: Yes   Other Topics Concern  . Not on file   Social History Narrative   Married, disabled Dealer (COPD). Daily caffeine use - 2 cup/day.   Right-handed.     PHYSICAL EXAM   Vitals:   04/13/16 1126  BP: 130/85  Pulse: 79  Weight: 296 lb (134.3 kg)    Not recorded      Body mass index is 40.14 kg/m.  PHYSICAL EXAMNIATION:  Gen: NAD, conversant, well nourised, obese, well groomed                     Cardiovascular: Regular rate rhythm, no peripheral edema, warm, nontender. Eyes: Conjunctivae clear without exudates or hemorrhage Neck: Supple, no carotid bruits. Pulmonary: Clear to auscultation bilaterally   NEUROLOGICAL EXAM:  MENTAL STATUS: Speech:    Speech is normal; fluent and spontaneous with normal comprehension.  Cognition:Mini-Mental Status Examination 25/30 animal naming 10     Orientation: He is not oriented to date, day,     Recent and remote memory: He missed 3 out of 3 recalls     Normal Attention  span and concentration     Normal Language, naming, repeating,spontaneous speech     Fund of knowledge   CRANIAL NERVES: CN II: Visual fields are full to confrontation. Fundoscopic exam is normal with sharp discs and no vascular changes. Pupils are round equal and briskly reactive to light. CN III, IV, VI: extraocular movement are normal. No ptosis. CN V: Facial sensation is intact to pinprick in all 3 divisions bilaterally. Corneal responses are intact.  CN VII: Face is symmetric with normal eye closure and smile. CN VIII: Hearing is normal to rubbing fingers CN IX, X: Palate elevates symmetrically. Phonation is normal. CN XI: Head turning and shoulder shrug are intact CN XII: Tongue is midline with normal movements and no atrophy.  MOTOR: There is no pronator drift of out-stretched arms. Muscle bulk and tone are normal. Muscle strength is normal.  REFLEXES: Reflexes are 2+ and symmetric at the biceps, triceps, knees, and ankles. Plantar responses are flexor.  SENSORY: Length dependent decreased to light touch, pinprick and vibratory sensation to distal shin level   COORDINATION: Rapid alternating movements and fine finger movements are intact. There is no dysmetria on finger-to-nose and heel-knee-shin.    GAIT/STANCE: Posture is normal. Gait is steady with normal steps, base, arm swing, and turning. Heel and toe walking are normal. Tandem gait is normal.  Romberg is absent.   DIAGNOSTIC DATA (LABS, IMAGING, TESTING) - I reviewed patient records, labs, notes, testing and imaging myself where available.   ASSESSMENT AND PLAN  ROLONDO PIERRE is a 65 y.o. male   New onset confusion slowly improving  Differentiation diagnosis including stroke, versus metabolic encephalopathy due to prolonged hypoglycemia glucose was documented 25  Proceed with MRI of the brain  EEG  Laboratory evaluations, repeat TSH, B12  Marcial Pacas, M.D. Ph.D.  [facility] [address] [place], [place] [address] Ph: [phone] Fax: [phone]  CC: Chipper Herb, MD

## 2016-04-14 ENCOUNTER — Encounter: Payer: Self-pay | Admitting: *Deleted

## 2016-04-14 DIAGNOSIS — M1990 Unspecified osteoarthritis, unspecified site: Secondary | ICD-10-CM | POA: Diagnosis not present

## 2016-04-14 DIAGNOSIS — I4891 Unspecified atrial fibrillation: Secondary | ICD-10-CM | POA: Diagnosis not present

## 2016-04-14 DIAGNOSIS — I110 Hypertensive heart disease with heart failure: Secondary | ICD-10-CM | POA: Diagnosis not present

## 2016-04-14 DIAGNOSIS — I5031 Acute diastolic (congestive) heart failure: Secondary | ICD-10-CM | POA: Diagnosis not present

## 2016-04-14 DIAGNOSIS — C8512 Unspecified B-cell lymphoma, intrathoracic lymph nodes: Secondary | ICD-10-CM | POA: Diagnosis not present

## 2016-04-14 DIAGNOSIS — J449 Chronic obstructive pulmonary disease, unspecified: Secondary | ICD-10-CM | POA: Diagnosis not present

## 2016-04-14 DIAGNOSIS — E1165 Type 2 diabetes mellitus with hyperglycemia: Secondary | ICD-10-CM | POA: Diagnosis not present

## 2016-04-14 DIAGNOSIS — G4733 Obstructive sleep apnea (adult) (pediatric): Secondary | ICD-10-CM | POA: Diagnosis not present

## 2016-04-14 LAB — THYROID PANEL WITH TSH
Free Thyroxine Index: 1.9 (ref 1.2–4.9)
T3 Uptake Ratio: 34 % (ref 24–39)
T4, Total: 5.7 ug/dL (ref 4.5–12.0)
TSH: 1.86 u[IU]/mL (ref 0.450–4.500)

## 2016-04-14 LAB — FOLATE: FOLATE: 10.6 ng/mL (ref >3.0)

## 2016-04-14 LAB — VITAMIN B12: VITAMIN B 12: 563 pg/mL (ref 232–1245)

## 2016-04-15 ENCOUNTER — Encounter: Payer: Self-pay | Admitting: Pharmacist Clinician (PhC)/ Clinical Pharmacy Specialist

## 2016-04-15 ENCOUNTER — Ambulatory Visit (INDEPENDENT_AMBULATORY_CARE_PROVIDER_SITE_OTHER): Payer: Commercial Managed Care - HMO | Admitting: Pharmacist Clinician (PhC)/ Clinical Pharmacy Specialist

## 2016-04-15 DIAGNOSIS — Z7901 Long term (current) use of anticoagulants: Secondary | ICD-10-CM | POA: Diagnosis not present

## 2016-04-15 DIAGNOSIS — I482 Chronic atrial fibrillation, unspecified: Secondary | ICD-10-CM

## 2016-04-15 DIAGNOSIS — R7989 Other specified abnormal findings of blood chemistry: Secondary | ICD-10-CM | POA: Diagnosis not present

## 2016-04-15 DIAGNOSIS — D72829 Elevated white blood cell count, unspecified: Secondary | ICD-10-CM

## 2016-04-15 DIAGNOSIS — I4891 Unspecified atrial fibrillation: Secondary | ICD-10-CM | POA: Diagnosis not present

## 2016-04-15 DIAGNOSIS — R945 Abnormal results of liver function studies: Secondary | ICD-10-CM

## 2016-04-15 LAB — COAGUCHEK XS/INR WAIVED
INR: 2.6 — AB (ref 0.9–1.1)
Prothrombin Time: 31.4 s

## 2016-04-15 NOTE — Progress Notes (Signed)
Diabetes Follow-Up Visit Chief Complaint:   Chief Complaint  Patient presents with  . Diabetes     Exam Regularity:  RRR Edema:  neg  Polyuria:  Positive but on diuretic  Polydipsia:  neg Polyphagia:  neg  BMI:  There is no height or weight on file to calculate BMI.   Weight changes:  Down 4 lbs BMP today General Appearance:  alert, oriented, no acute distress Mood/Affect:  normal  HPI:  Long standing type 2 diabetes was recently in hospital for hypoglycemia with BG of 25 and pneumoniae.  Low fat/carbohydrate diet?  No Nicotine Abuse?  No Medication Compliance?  Yes Exercise?  No Alcohol Abuse?  No  Home BG Monitoring:  Checking 2 times a day. Average:  110  High: 110  Low:  98 Since off prednisone and home from hospital   Lab Results  Component Value Date   HGBA1C 5.9 (H) 03/28/2016    Lab Results  Component Value Date   MICROALBUR 20 11/28/2013    Lab Results  Component Value Date   CHOL 140 02/17/2016   HDL 47 02/17/2016   LDLCALC 45 09/29/2015   TRIG 159 (H) 02/17/2016   CHOLHDL 2.7 09/29/2015    INR today 2.6  Continue regular schedule of warfarin  Assessment: 1.  Diabetes.  A1C at goal main goal is to avoid hypoglycemia since patient more fearful of it now 2.  Blood Pressure.  At goal 3.  Lipids.  Not assessed this visit 4.  Foot Care.  Reviewed with patient 5.  Dental Care.  reviewed 6.  Eye Care/Exam.  Reviewed annual exams  Recommendations: 1.  Patient is counseled on appropriate foot care. 2.  BP goal < 130/80. 3.  LDL goal of < 100, HDL > 40 and TG < 150. 4.  Eye Exam yearly and Dental Exam every 6 months. 5.  Dietary recommendations:  Spent extensive time educating patient and wife on meal planning, reading food labels, and carb counting.  They were previously not following a diabetic diet and had a poor understanding of carbs.   6.  Physical Activity recommendations:  15-20 min a day 7.  Medication recommendations at this time are as follows:   Continue same- Regular and NPH insulin were both decreased in hospital.  Will most likely need to be further adjusted with patient changing his eating habits.  Would recommend Lantus if patient's insurance coverage is good, they are going to check with [facility] and call me back.  He doesn't like taking so many shots each day.  Also would look at changing Regular to short acting analogue.   8.  Counseled patient on side day protocols and insulin use when he is not eating and sick.  He continued to take his same insulin dose without eating and this is what prompted the severe hypglycemic episode. 8.  Return to clinic in 3-4 months   Time spent counseling patient:  45 min  Physician time spent with patient:  0 Referring provider:  Laurance Flatten   PharmD:  [name], PharmD

## 2016-04-16 LAB — CBC WITH DIFFERENTIAL/PLATELET
BASOS ABS: 0 10*3/uL (ref 0.0–0.2)
Basos: 0 %
EOS (ABSOLUTE): 0.1 10*3/uL (ref 0.0–0.4)
Eos: 1 %
HEMATOCRIT: 46.8 % (ref 37.5–51.0)
Hemoglobin: 15.6 g/dL (ref 13.0–17.7)
IMMATURE GRANULOCYTES: 0 %
Immature Grans (Abs): 0 10*3/uL (ref 0.0–0.1)
LYMPHS ABS: 2 10*3/uL (ref 0.7–3.1)
Lymphs: 19 %
MCH: 30 pg (ref 26.6–33.0)
MCHC: 33.3 g/dL (ref 31.5–35.7)
MCV: 90 fL (ref 79–97)
Monocytes Absolute: 0.6 10*3/uL (ref 0.1–0.9)
Monocytes: 6 %
NEUTROS PCT: 74 %
Neutrophils Absolute: 7.9 10*3/uL — ABNORMAL HIGH (ref 1.4–7.0)
PLATELETS: 160 10*3/uL (ref 150–379)
RBC: 5.2 x10E6/uL (ref 4.14–5.80)
RDW: 14.1 % (ref 12.3–15.4)
WBC: 10.7 10*3/uL (ref 3.4–10.8)

## 2016-04-16 LAB — HEPATIC FUNCTION PANEL
ALBUMIN: 3.7 g/dL (ref 3.6–4.8)
ALT: 36 IU/L (ref 0–44)
AST: 26 IU/L (ref 0–40)
Alkaline Phosphatase: 85 IU/L (ref 39–117)
Bilirubin Total: 1.5 mg/dL — ABNORMAL HIGH (ref 0.0–1.2)
Bilirubin, Direct: 0.34 mg/dL (ref 0.00–0.40)
Total Protein: 6 g/dL (ref 6.0–8.5)

## 2016-04-19 ENCOUNTER — Telehealth: Payer: Self-pay | Admitting: Pharmacist

## 2016-04-19 DIAGNOSIS — M1990 Unspecified osteoarthritis, unspecified site: Secondary | ICD-10-CM | POA: Diagnosis not present

## 2016-04-19 DIAGNOSIS — I4891 Unspecified atrial fibrillation: Secondary | ICD-10-CM | POA: Diagnosis not present

## 2016-04-19 DIAGNOSIS — J449 Chronic obstructive pulmonary disease, unspecified: Secondary | ICD-10-CM | POA: Diagnosis not present

## 2016-04-19 DIAGNOSIS — G4733 Obstructive sleep apnea (adult) (pediatric): Secondary | ICD-10-CM | POA: Diagnosis not present

## 2016-04-19 DIAGNOSIS — C8512 Unspecified B-cell lymphoma, intrathoracic lymph nodes: Secondary | ICD-10-CM | POA: Diagnosis not present

## 2016-04-19 DIAGNOSIS — E1165 Type 2 diabetes mellitus with hyperglycemia: Secondary | ICD-10-CM | POA: Diagnosis not present

## 2016-04-19 DIAGNOSIS — I110 Hypertensive heart disease with heart failure: Secondary | ICD-10-CM | POA: Diagnosis not present

## 2016-04-19 DIAGNOSIS — I5031 Acute diastolic (congestive) heart failure: Secondary | ICD-10-CM | POA: Diagnosis not present

## 2016-04-20 ENCOUNTER — Other Ambulatory Visit: Payer: Self-pay

## 2016-04-20 NOTE — Telephone Encounter (Signed)
Patient's wife tried to get information regarding coverage of Basal insulin per instructions from [name] but she was told by insurance and [facility] that they need Rx to determine cost of insulin.  I will try to check patient's formulary regarding coverage of insulin.   Patient's wife also asked about referral for MRI made by neurologist.  It appears that MRI has been ordered.  Mrs. Spurr was given instructions from Dr Rhea Belton office to call to make appt for MRI with [facility].  She will do this and call me back if unable to make appt herself.

## 2016-04-20 NOTE — Telephone Encounter (Signed)
Reviewed patient's [facility] Medicare formulary for 2018.  The following insulins are tier 3 (lowest for insulin) and would cost $47 per 30 day supply: Lantus, Levemir, Novolog, Toujeo, Tresiba, Novolin NPH and Novolin R.  In past patient has used Novolin NPH and Novolin R because he is able to avoid Medicare Coverage gap.  However we need to assess risk of hypoglycemia vs. Cost.   Plan is for patient to finish insulin he has on hand - take Novolin NPH 45 units once a day and Novolin R 10 units tid.  Discussed placing CGM with his PCP and felt this was a good idea given patient's hospitalization recently for hypoglycemia. He will come to office 04/26/16.  Will place a CGM and give just basal insulin for him to use for 2 weeks.  When he RTO 05/09/16 to see PCP will download CGM and make adjustments as needed.

## 2016-04-20 NOTE — Patient Outreach (Addendum)
[facility]) Care Management  04/20/2016  Justin Ruiz Mar 07, 1951 [id number]  REFERRAL DATE; 04/19/16 REFERRAL SOURCE: [employer] REFERRAL REASON: Recent hospitalization for heart failure, pneumonia CONSENT: HIPAA verified with patient. Patient gave verbal authorization to speak with his wife, Justin Ruiz regarding all of his persona health information  PROVIDERS:  Dr. Redge Ruiz - primary MD Dr. Baird Ruiz - pulmonologist Dr. Minus Ruiz - cardiologist [facility]: nursing services.   SOCIAL:  Patient lives with his wife, Justin Ruiz Patient is independent in ADL's per wife but must be verbally cued. Patient requires assistance with IADL's per wife.   SUBJECTIVE;  Telephone call to patients wife at request of patient.  Discussed and offered Transition of care follow up for patient. Wife verbally agreed. Wife states patient was admitted from 03/28/16 to 04/04/16 due to low blood sugar, pneumonia and heart failure.  CURRENT STATUS: Wife states patient has a history of COPD, heart failure, atrial fibrillation, and insulin dependent diabetes.  Wife states patient has been doing a little better. States he is sleeping well and has a good appetite.  Wife states patient has had some memory loss since being in the hospital. Wife states neurology is evaluating him to determine what has caused his change in memory. Wife states patient is scheduled for an MRI on 05/01/15 and scheduled to follow up with neurology on 05/24/15. Wife states patient's blood sugars drop low when he is really sick. Wife states patient is on home O2 at [place] states patient has had a follow up with his primary doctor on 04/08/16 and has another appointment on 05/10/15.  Wife states patient is scheduled for an EEG on 05/24/15.   MEDICATIONS;  Wife states due to patients recent memory loss she is now having to prepare patients medications/ insulin.   States patient is able to administer to self.   Wife states patient is not very motivated like he once was. State prior to being hospitalized patient was cooking. States now patient has to be cued to take his shower and prepare small meal.  Wife states all patient would rather do now is sit on the couch and watch TV.  Wife states she has not allowed patient to drive due to his recent difficulty with his memory.  Wife states patient has a medical alert bracelet and necklace. Wife states patient has an MRI scheduled for 05/01/15.  Wife states patient is currently seeing Justin Ruiz at Dr. Georgia Duff office to work with patient regarding his blood glucose level. State his next appointment in on 04/27/2015.   Wife states patient is weighing daily and checking blood sugars. States today's weight is 297 lbs and his blood sugar was post prandial in the 200's.  ASSESSMENT; Transition of care follow up. Patients wife is caregiver  PLAN: RNCM will follow up with patients spouse within 2 weeks. RNCM will send patient [facility] care management packet with consent form. RNCM will send patient welcome letter  Justin Plowman RN,BSN,CCM [facility] Telephonic  [phone]

## 2016-04-22 DIAGNOSIS — E1165 Type 2 diabetes mellitus with hyperglycemia: Secondary | ICD-10-CM | POA: Diagnosis not present

## 2016-04-22 DIAGNOSIS — C8512 Unspecified B-cell lymphoma, intrathoracic lymph nodes: Secondary | ICD-10-CM | POA: Diagnosis not present

## 2016-04-22 DIAGNOSIS — I110 Hypertensive heart disease with heart failure: Secondary | ICD-10-CM | POA: Diagnosis not present

## 2016-04-22 DIAGNOSIS — I5031 Acute diastolic (congestive) heart failure: Secondary | ICD-10-CM | POA: Diagnosis not present

## 2016-04-22 DIAGNOSIS — J449 Chronic obstructive pulmonary disease, unspecified: Secondary | ICD-10-CM | POA: Diagnosis not present

## 2016-04-22 DIAGNOSIS — I4891 Unspecified atrial fibrillation: Secondary | ICD-10-CM | POA: Diagnosis not present

## 2016-04-22 DIAGNOSIS — M1990 Unspecified osteoarthritis, unspecified site: Secondary | ICD-10-CM | POA: Diagnosis not present

## 2016-04-22 DIAGNOSIS — G4733 Obstructive sleep apnea (adult) (pediatric): Secondary | ICD-10-CM | POA: Diagnosis not present

## 2016-04-24 ENCOUNTER — Ambulatory Visit (INDEPENDENT_AMBULATORY_CARE_PROVIDER_SITE_OTHER): Payer: Commercial Managed Care - HMO | Admitting: Family Medicine

## 2016-04-24 DIAGNOSIS — I110 Hypertensive heart disease with heart failure: Secondary | ICD-10-CM

## 2016-04-24 DIAGNOSIS — J449 Chronic obstructive pulmonary disease, unspecified: Secondary | ICD-10-CM

## 2016-04-24 DIAGNOSIS — E1165 Type 2 diabetes mellitus with hyperglycemia: Secondary | ICD-10-CM | POA: Diagnosis not present

## 2016-04-24 DIAGNOSIS — I5031 Acute diastolic (congestive) heart failure: Secondary | ICD-10-CM | POA: Diagnosis not present

## 2016-04-26 ENCOUNTER — Encounter: Payer: Self-pay | Admitting: Pharmacist

## 2016-04-26 ENCOUNTER — Ambulatory Visit (INDEPENDENT_AMBULATORY_CARE_PROVIDER_SITE_OTHER): Payer: Medicare HMO | Admitting: Pharmacist

## 2016-04-26 ENCOUNTER — Other Ambulatory Visit: Payer: Self-pay | Admitting: Family Medicine

## 2016-04-26 VITALS — BP 132/80 | HR 88

## 2016-04-26 DIAGNOSIS — L299 Pruritus, unspecified: Secondary | ICD-10-CM

## 2016-04-26 DIAGNOSIS — E119 Type 2 diabetes mellitus without complications: Secondary | ICD-10-CM | POA: Diagnosis not present

## 2016-04-26 DIAGNOSIS — Z794 Long term (current) use of insulin: Secondary | ICD-10-CM

## 2016-04-26 MED ORDER — INSULIN DEGLUDEC 100 UNIT/ML ~~LOC~~ SOPN: 32.0000 [IU] | PEN_INJECTOR | Freq: Every day | SUBCUTANEOUS | 0 refills | Status: DC

## 2016-04-26 MED ORDER — INSULIN REGULAR HUMAN 100 UNIT/ML IJ SOLN: INTRAMUSCULAR | 0 refills | Status: DC

## 2016-04-26 MED ORDER — TRIAMCINOLONE ACETONIDE 0.1 % EX CREA: TOPICAL_CREAM | CUTANEOUS | 0 refills | Status: DC

## 2016-04-26 MED ORDER — CETIRIZINE HCL 10 MG PO CAPS
10.0000 mg | ORAL_CAPSULE | Freq: Every evening | ORAL | Status: DC | PRN
Start: 2016-04-26 — End: 2016-11-22

## 2016-04-26 NOTE — Patient Instructions (Addendum)
Diabetes and Standards of Medical Care   Diabetes is complicated. You may find that your diabetes team includes a dietitian, nurse, diabetes educator, eye doctor, and more. To help everyone know what is going on and to help you get the care you deserve, the following schedule of care was developed to help keep you on track. Below are the tests, exams, vaccines, medicines, education, and plans you will need.  Blood Glucose Goals Prior to meals = 80 - 130 Within 2 hours of the start of a meal = less than 180  Stop NPH or N Insulin Start in place of it [place] - inject 32 units once a day.    Continuous Glucose monitor was placed today to try to get better understanding of your blood glucose trends.   It was placed on the back of your upper left arm.  Patient will wear at least 5 days and up to 14 days.  He is to keep BG, diet and insulin administration records. You can engage in regular activities with special care when showering, changing clothes and swimming (only up to 3 feet and for 30 minutes at a time)   HbA1c test (goal is less than 7.0% - your last value was 5.9%) This test shows how well you have controlled your glucose over the past 2 to 3 months. It is used to see if your diabetes management plan needs to be adjusted.   It is performed at least 2 times a year if you are meeting treatment goals.  It is performed 4 times a year if therapy has changed or if you are not meeting treatment goals.  Blood pressure test  This test is performed at every routine medical visit. The goal is less than 140/90 mmHg for most people, but 130/80 mmHg in some cases. Ask your health care provider about your goal.  Dental exam  Follow up with the dentist regularly.  Eye exam  If you are diagnosed with type 1 diabetes as a child, get an exam upon reaching the age of 28 years or older and have had diabetes for 3 to 5 years. Yearly eye exams are recommended after that initial eye exam.  If you  are diagnosed with type 1 diabetes as an adult, get an exam within 5 years of diagnosis and then yearly.  If you are diagnosed with type 2 diabetes, get an exam as soon as possible after the diagnosis and then yearly.  Foot care exam  Visual foot exams are performed at every routine medical visit. The exams check for cuts, injuries, or other problems with the feet.  A comprehensive foot exam should be done yearly. This includes visual inspection as well as assessing foot pulses and testing for loss of sensation.  Check your feet nightly for cuts, injuries, or other problems with your feet. Tell your health care provider if anything is not healing.  Kidney function test (urine microalbumin)  This test is performed once a year.  Type 1 diabetes: The first test is performed 5 years after diagnosis.  Type 2 diabetes: The first test is performed at the time of diagnosis.  A serum creatinine and estimated glomerular filtration rate (eGFR) test is done once a year to assess the level of chronic kidney disease (CKD), if present.  Lipid profile (cholesterol, HDL, LDL, triglycerides)  Performed every 5 years for most people.  The goal for LDL is less than 100 mg/dL. If you are at high risk, the goal is  less than 70 mg/dL.  The goal for HDL is 40 mg/dL to 50 mg/dL for men and 50 mg/dL to 60 mg/dL for women. An HDL cholesterol of 60 mg/dL or higher gives some protection against heart disease.  The goal for triglycerides is less than 150 mg/dL.  Influenza vaccine, pneumococcal vaccine, and hepatitis B vaccine  The influenza vaccine is recommended yearly.  The pneumococcal vaccine is generally given once in a lifetime. However, there are some instances when another vaccination is recommended. Check with your health care provider.  The hepatitis B vaccine is also recommended for adults with diabetes.  Diabetes self-management education  Education is recommended at diagnosis and ongoing as  needed.  Treatment plan  Your treatment plan is reviewed at every medical visit.    Hypoglycemia Hypoglycemia occurs when the level of sugar (glucose) in the blood is too low. Glucose is a type of sugar that provides the body's main source of energy. Certain hormones (insulin and glucagon) control the level of glucose in the blood. Insulin lowers blood glucose, and glucagon increases blood glucose. Hypoglycemia can result from having too much insulin in the bloodstream, or from not eating enough food that contains glucose. Hypoglycemia can happen in people who do or do not have diabetes. It can develop quickly, and it can be a medical emergency. What are the causes? Hypoglycemia occurs most often in people who have diabetes. If you have diabetes, hypoglycemia may be caused by:  Diabetes medicine.  Not eating enough, or not eating often enough.  Increased physical activity.  Drinking alcohol, especially when you have not eaten recently. If you do not have diabetes, hypoglycemia may be caused by:  A tumor in the pancreas. The pancreas is the organ that makes insulin.  Not eating enough, or not eating for long periods at a time (fasting).  Severe infection or illness that affects the liver, heart, or kidneys.  Certain medicines. You may also have reactive hypoglycemia. This condition causes hypoglycemia within 4 hours of eating a meal. This may occur after having stomach surgery. Sometimes, the cause of reactive hypoglycemia is not known. What increases the risk? Hypoglycemia is more likely to develop in:  People who have diabetes and take medicines to lower blood glucose.  People who abuse alcohol.  People who have a severe illness. What are the signs or symptoms? Hypoglycemia may not cause any symptoms. If you have symptoms, they may include:  Hunger.  Anxiety.  Sweating and feeling clammy.  Confusion.  Dizziness or feeling  light-headed.  Sleepiness.  Nausea.  Increased heart rate.  Headache.  Blurry vision.  Seizure.  Nightmares.  Tingling or numbness around the mouth, lips, or tongue.  A change in speech.  Decreased ability to concentrate.  A change in coordination.  Restless sleep.  Tremors or shakes.  Fainting.  Irritability. How is this diagnosed? Hypoglycemia is diagnosed with a blood test to measure your blood glucose level. This blood test is done while you are having symptoms. Your health care provider may also do a physical exam and review your medical history. If you do not have diabetes, other tests may be done to find the cause of your hypoglycemia. How is this treated? This condition can often be treated by immediately eating or drinking something that contains glucose, such as:  3-4 sugar tablets (glucose pills).  Glucose gel, 15-gram tube.  Fruit juice, 4 oz (120 mL).  Regular soda (not diet soda), 4 oz (120 mL).  Low-fat milk,  4 oz (120 mL).  Several pieces of hard candy.  Sugar or honey, 1 Tbsp. Treating Hypoglycemia If You Have Diabetes  If you are alert and able to swallow safely, follow the 15:15 rule:  Take 15 grams of a rapid-acting carbohydrate. Rapid-acting options include:  1 tube of glucose gel.  3 glucose pills.  6-8 pieces of hard candy.  4 oz (120 mL) of fruit juice.  4 oz (120 ml) of regular (not diet) soda.  Check your blood glucose 15 minutes after you take the carbohydrate.  If the repeat blood glucose level is still at or below 70 mg/dL (3.9 mmol/L), take 15 grams of a carbohydrate again.  If your blood glucose level does not increase above 70 mg/dL (3.9 mmol/L) after 3 tries, seek emergency medical care.  After your blood glucose level returns to normal, eat a meal or a snack within 1 hour. Treating Severe Hypoglycemia  Severe hypoglycemia is when your blood glucose level is at or below 54 mg/dL (3 mmol/L). Severe hypoglycemia  is an emergency. Do not wait to see if the symptoms will go away. Get medical help right away. Call your local emergency services (911 in the [place] Do not drive yourself to the hospital. If you have severe hypoglycemia and you cannot eat or drink, you may need an injection of glucagon. A family member or close friend should learn how to check your blood glucose and how to give you a glucagon injection. Ask your health care provider if you need to have an emergency glucagon injection kit available. Severe hypoglycemia may need to be treated in a hospital. The treatment may include getting glucose through an IV tube. You may also need treatment for the cause of your hypoglycemia. Follow these instructions at home: General instructions  Avoid any diets that cause you to not eat enough food. Talk with your health care provider before you start any new diet.  Take over-the-counter and prescription medicines only as told by your health care provider.  Limit alcohol intake to no more than 1 drink per day for nonpregnant women and 2 drinks per day for men. One drink equals 12 oz of beer, 5 oz of wine, or 1 oz of hard liquor.  Keep all follow-up visits as told by your health care provider. This is important. If You Have Diabetes:   Make sure you know the symptoms of hypoglycemia.  Always have a rapid-acting carbohydrate snack with you to treat low blood sugar.  Follow your diabetes management plan, as told by your health care provider. Make sure you:  Take your medicines as directed.  Follow your exercise plan.  Follow your meal plan. Eat on time, and do not skip meals.  Check your blood glucose as often as directed. Make sure to check your blood glucose before and after exercise. If you exercise longer or in a different way than usual, check your blood glucose more often.  Follow your sick day plan whenever you cannot eat or drink normally. Make this plan in advance with your health care  provider.  Share your diabetes management plan with people in your workplace, school, and household.  Check your urine for ketones when you are ill and as told by your health care provider.  Carry a medical alert card or wear medical alert jewelry. If You Have Reactive Hypoglycemia or Low Blood Sugar From Other Causes:  Monitor your blood glucose as told by your health care provider.  Follow instructions from your  health care provider about eating or drinking restrictions. Contact a health care provider if:  You have problems keeping your blood glucose in your target range.  You have frequent episodes of hypoglycemia. Get help right away if:  You continue to have hypoglycemia symptoms after eating or drinking something containing glucose.  Your blood glucose is at or below 54 mg/dL (3 mmol/L).  You have a seizure.  You faint. These symptoms may represent a serious problem that is an emergency. Do not wait to see if the symptoms will go away. Get medical help right away. Call your local emergency services (911 in the [place] Do not drive yourself to the hospital.  This information is not intended to replace advice given to you by your health care provider. Make sure you discuss any questions you have with your health care provider. Document Released: 04/11/2005 Document Revised: 09/23/2015 Document Reviewed: 05/15/2015 Elsevier Interactive Patient Education  2017 [employer].

## 2016-04-26 NOTE — Progress Notes (Signed)
Patient ID: Justin Ruiz, male   DOB: 02/21/1951, 66 y.o.   MRN: [id number]  Subjective:    Justin Ruiz is a 66 y.o. male who presents for a follow up evaluation of Type 2 diabetes mellitus requiring insulin therapy and to have CGM placed per Dr. Laurance Flatten.  Justin Ruiz was hospitalized 03/28/2016 due to severe hypoglycemia.  Insulin was adjusted and he followed up with Justin Ruiz 04/15/2016.    Justin Ruiz reports that BG is still very variable.   Current regimen is : metformin 1000mg  bid, NPH insulin 45 units qam and Regular insuiln 10 units prior to each meal.    Current monitoring regimen: home blood tests - 3 to 4 times daily Home blood sugar records: ranges usually from 170's to 200's but this am was in 50's/ Any episodes of hypoglycemia? yes   Known diabetic complications: peripheral neuropathy, cardiovascular disease and cerebrovascular disease Cardiovascular risk factors: advanced age (older than 69 for men, 54 for women), diabetes mellitus, dyslipidemia, hypertension, male gender, obesity (BMI >= 30 kg/m2) and sedentary lifestyle  Eye exam current (within one year): yes Weight trend: stable currently but he has lost about 34 lbs from 09/2015 until 03/2016 leading up to hhospitalization Prior visit with CDE: yes  Current diet: in general, a "healthy" diet   - patient with help of wife if following CHO counting diet recommended by Justin Ruiz, , CPP, PharmD at last visit.  Goal is 45 grams of CHO per meal per wife Current exercise: none Medication Compliance?  Yes   Is He on ACE inhibitor or angiotensin II receptor blocker?  No  Lisinopril was stopped in hospital 03/2016.   Patient's wife is also concerned about patient scratching arms and legs.  She states this started about 2 weeks ago.  Observed 4 to 5 areas on left arm and 3 areas on left and right lower legs.   Patient states itches most at night.   Objective:    BP 132/80   Pulse 88   SpO2 97%   Lab  Review Glucose  Date Value  04/08/2016 295 mg/dL (H)  10/08/2015 206 mg/dl (H)  04/09/2015 195 mg/dl (H)  10/14/2014 260 mg/dl (H)  09/03/2012 202 mg/dl (H)  02/24/2012 208 mg/dl (H)   Glucose, Bld (mg/dL)  Date Value  04/03/2016 144 (H)  04/02/2016 96  04/01/2016 136 (H)  11/01/2011 193 (H)  05/11/2011 220 (H)  12/28/2010 161 (H)   Chloride (mEq/L)  Date Value  10/08/2015 100  04/09/2015 102  10/14/2014 101   CO2  Date Value  04/08/2016 27 mmol/L  04/03/2016 34 mmol/L (H)  04/02/2016 37 mmol/L (H)  10/08/2015 31 mEq/L (H)  04/09/2015 29 mEq/L  10/14/2014 29 mEq/L   BUN (mg/dL)  Date Value  04/08/2016 40 (H)  04/03/2016 31 (H)  04/02/2016 33 (H)  04/01/2016 39 (H)  02/17/2016 19  10/08/2015 22.9  09/29/2015 18  04/09/2015 24.9  10/14/2014 21.1   Creatinine (mg/dL)  Date Value  10/08/2015 1.0  04/09/2015 1.0  10/14/2014 0.9   Creatinine, Ser (mg/dL)  Date Value  04/08/2016 1.23  04/03/2016 0.85  04/02/2016 0.91   Last A1c = 5.9% (03/28/2016)   Assessment:    Diabetes Mellitus type II, under inadequate control. BG is variable and patient is at high risk of hypoglycemia.  Obesity - weight is stable currently Itching    Plan:    1.  Rx changes:   Discontinue NPH insulin   Start Tyler Aas -  start with 32 units once daily  Change Regular insulin to sliding scale for now.  May need to add back on regular basis.  Continue to check blood glucose prior to each meal.  Inject Regular insulin (R insulin) as   needed for blood glucose over 200:   If blood glucose is 200 to 250 - give 3 units   If blood glucose is 251 to 300 - give 5 units   If blood glucose if 301 or above - give 8 units.  Trial of zyrtec 10mg  qhs and triamcinolone 0.1% bid for itching and rash. RTC in 8 days. 2.  Education: Reviewed 'ABCs' of diabetes management (respective goals in parentheses):  A1C (<7), blood pressure (<130/80), and cholesterol (LDL <100). 3. Continuous Glucose  monitor was placed today to try to get better understanding of BG trends.  Patient educated about proper care of CGM and site.  CGM was placed on back of upper left arm.  Patient will wear at least 5 days and up to 14 days.  He is to keep BG, diet and insulin administration records. He can engage in regular activities with special care when showering, changing clothes and swimming (only up to 3 feet and for 30 minutes at a time) 4. CHO counting diet discussed.  Reviewed CHO amount in various foods and how to read nutrition labels.  Recommended CHO goal 45 to 55 grams per meal.  15 to 20 grams per snack. 5.  Recommend check BG 4  times a day 6.  Recommended increase physical activity - goal is 150 minutes per week 7. Follow up: 2 weeks

## 2016-04-27 ENCOUNTER — Other Ambulatory Visit: Payer: Self-pay

## 2016-04-27 NOTE — Patient Outreach (Signed)
[facility]) Care Management  04/27/2016  Justin Ruiz 05-11-1950 [id number]  REFERRAL DATE; 04/19/16 REFERRAL SOURCE: [employer] REFERRAL REASON: Recent hospitalization for heart failure, pneumonia CONSENT: HIPAA verified with patient. Patient gave verbal authorization to speak with his wife, Alvar Huscher regarding all of his persona health information  PROVIDERS:  Dr. Redge Gainer - primary MD Dr. Baird Lyons - pulmonologist Dr. Minus Breeding - cardiologist [facility]: nursing services.   SOCIAL:  Patient lives with his wife, Fahd Antczak Patient is independent in ADL's per wife but must be verbally cued. Patient requires assistance with IADL's per wife.   SUBJECTIVE: Telephone call to patients wife, Levi Mulberry.  HIPAA verified with wife for patient. Wife states patient is doing pretty good. Wife states patient had some shortness of breath on yesterday but none so far today. Wife states patient's oxygen saturations were in the 90's this morning. Wife states the doctor said patient does not have to wear his oxygen if his sats are in the 90's. Wife states patient has a MRI scheduled for this Saturday, 04/30/16.   Wife states patient was seen at doctors office on yesterday and a glucose monitor was applied to his arm so that his blood sugars can be monitored. Wife states the doctor put patient on a new insulin medication. Wife states  She does not know the name of the medication and the medication is in another room. Wife states she is busy right now and is unable to get medication or complete call. Wife request call back at a later date.    PLAN: RNCM will attempt 2nd telephone outreach to patient within 1 week.   [name] RN,BSN,CCM [facility] Telephonic  [phone]

## 2016-04-28 ENCOUNTER — Telehealth: Payer: Self-pay | Admitting: *Deleted

## 2016-04-28 NOTE — Progress Notes (Signed)
HPI Patient presents for routine followup atrial fib. Since I last saw him he was hospitalized in Dec with hypoglycemia and respiratory failure.  I reviewed these records.  He was treated for pneumonia and probable acute on chronic heart failure with an EF on echo of 50%.  He was diuresed 18 liters.  He had atrial fib that was treated with metoprolol.    Since discharge from the hospital his wife has reported that he's not been himself. He is more forgetful. He's had a neurology evaluation. He is going to have an MRI. His weights have been relatively stable. She says he has not been taking extra diuretic.  He was very SOB after being brought back to the exam room today but he did not wear his oxygen.  After placing 2 liters on him he came up to 97%.  It is not clear why he did not wear his O2.  He's not been having any chest pressure, neck or arm discomfort. His lower externally swelling is much less than it was prior to admission.  Allergies  Allergen Reactions  . Penicillins     Unknown reaction  . Avapro [Irbesartan] Other (See Comments)    "doesnt sit right with me"--light headed  . Lipitor [Atorvastatin Calcium] Other (See Comments)    Leg pain    Current Outpatient Prescriptions  Medication Sig Dispense Refill  . acetaminophen (TYLENOL) 500 MG tablet Take 1,000 mg by mouth every 6 (six) hours as needed for headache. Reported on 10/08/2015    . albuterol (PROAIR HFA) 108 (90 Base) MCG/ACT inhaler Inhale 2 puffs into the lungs every 6 (six) hours as needed for wheezing or shortness of breath. 8.5 g 5  . ALPRAZolam (XANAX) 0.25 MG tablet Take 1 tablet (0.25 mg total) by mouth at bedtime. 30 tablet 5  . Blood Glucose Calibration (TAI DOC CONTROL) NORMAL SOLN Use as directed.    . Blood Glucose Monitoring Suppl (CLEVER CHEK AUTO-CODE VOICE) DEVI USE AS DIRECTED 1 each 2  . Cetirizine HCl 10 MG CAPS Take 1 capsule (10 mg total) by mouth at bedtime as needed. For itching    . Cholecalciferol  (VITAMIN D3) 5000 UNITS TABS Take 1 tablet by mouth every morning.    . furosemide (LASIX) 40 MG tablet Take 1 tablet (40 mg total) by mouth daily. 30 tablet 6  . guaiFENesin (MUCINEX) 600 MG 12 hr tablet Take 600 mg by mouth 2 (two) times daily as needed for cough or to loosen phlegm.    . insulin degludec (TRESIBA FLEXTOUCH) 100 UNIT/ML SOPN FlexTouch Pen Inject 0.32 mLs (32 Units total) into the skin daily. 6 mL 0  . insulin regular (NOVOLIN R,HUMULIN R) 250 units/2.34m (100 units/mL) injection Inject as needed for elevated blood glucose.  200 to 250 give 3 units; 251 to 300 give 5 units; 301 or over given 8 units. 10 mL 0  . ipratropium-albuterol (DUONEB) 0.5-2.5 (3) MG/3ML SOLN PLACE 1 VIAL (3 MLS) IN THE NEBULIZER EVERY 6 HOURS AS NEEDED 1080 mL 0  . Lancet Devices (LANCING DEVICE) MISC USE AS DIRECTED 1 each 2  . metFORMIN (GLUCOPHAGE) 500 MG tablet TAKE ONE TABLET BY MOUTH IN THE MORNING AND TAKE TWO TABLETS IN THE EVENING 270 tablet 0  . metoprolol (LOPRESSOR) 100 MG tablet Take 1 tablet (100 mg total) by mouth 2 (two) times daily. 180 tablet 3  . simvastatin (ZOCOR) 40 MG tablet TAKE ONE TABLET BY MOUTH ONCE DAILY 90 tablet 0  .  triamcinolone cream (KENALOG) 0.1 % Apply to arms and legs bid for itching / rash 80 g 0  . warfarin (COUMADIN) 5 MG tablet TAKE ONE & ONE-HALF TABLETS BY MOUTH ONCE DAILY EVERY MONDAY AND EVERY FRIDAY THEN TAKE ONE  TABLET THE REST OF THE WEEK 140 tablet 2   No current facility-administered medications for this visit.     Past Medical History:  Diagnosis Date  . Arthritis   . Asthma   . BPH (benign prostatic hypertrophy)   . Colon polyps   . COPD (chronic obstructive pulmonary disease) ([place])   . Diabetes mellitus   . GERD (gastroesophageal reflux disease)   . History of oxygen administration    occ. oxygen use at 2 l/m as needed- not using at this time  . HTN (hypertension)    x 3 years  . Hyperlipidemia   . Lung nodules 03/11/2013  . Mediastinal  adenopathy 06/20/2013   CT  & PET 2/15   . Memory loss   . 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 NHL (non-Hodgkin's lymphoma) ([place])    nhl dx 9/11  . Obesity    exogenous  . Shingles   . Sleep apnea    recent study 04-07-14 awaiting results, no cpap yet.-Dr. Glynn Octave    Past Surgical History:  Procedure Laterality Date  . APPENDECTOMY    . COLONOSCOPY WITH PROPOFOL N/A 05/05/2014   Procedure: COLONOSCOPY WITH PROPOFOL;  Surgeon: Inda Castle, MD;  Location: [facility];  Service: Endoscopy;  Laterality: N/A;    ROS:  Itching skin.  As stated in the HPI and negative for all other systems.  PHYSICAL EXAM BP 110/78 (BP Location: Left Arm, Patient Position: Sitting, Cuff Size: Large)   Pulse 100   Ht 5' 11" (1.803 m)   Wt 296 lb 2 oz (134.3 kg)   BMI 41.30 kg/m  GENERAL:  Chronically ill appearing NECK:  No jugular venous distention, waveform within normal limits, carotid upstroke brisk and symmetric, no bruits, no thyromegaly LUNGS:  Clear to auscultation bilaterally CHEST:  Unremarkable HEART:  PMI not displaced or sustained,S1 and S2 within normal limits, no S3, no clicks, no rubs, no murmurs, irregular ABD:  Flat, positive bowel sounds normal in frequency in pitch, no bruits, no rebound, no guarding, no midline pulsatile mass, no hepatomegaly, no splenomegaly, obese EXT:  2 plus pulses throughout, no edema, no cyanosis no clubbing,  SKIN:  Excoriated skin lesions.   EKG:  Atrial fibrillation rate 86, axis within normal limits, intervals within normal limits, diffuse T wave flattening.  05/01/2016  Lab Results  Component Value Date   HGBA1C 5.9 (H) 03/28/2016     ASSESSMENT AND PLAN  FIBRILLATION, ATRIAL -  He has no symptomatic paroxysms. He tolerates anticoagulation. The patient is not interested in switching to a novel oral anticoagulant.   Justin Ruiz has a CHA2DS2 - VASc score of 2 with a risk of stroke of 2.2%.  No change in  therapy is planned  ACUTE ON CHRONIC DIASTOLIC HF -  We talked about when necessary dosing of his diuretic. For now he'll remain on the meds as listed. I have given instructions for a CBC and basic metabolic profile.   HTN -  The blood pressure is at target. No change in medications is indicated. We will continue with therapeutic lifestyle changes (TLC).  OBESITY, UNSPECIFIED - The patient understands the need to lose weight with diet and exercise.   DM -  His A1c was 5.9.  This is improved.  Continue current therapy.    DYSPNEA - This seems to be multifactorial. His oxygen sats were good on oxygen and I encouraged him to use this. I don't think he's volume overloaded at this point. We talked about weight management, daily weights and when necessary diuretics.  Hospital records reviewed.

## 2016-04-28 NOTE — Telephone Encounter (Signed)
Called patient's wife to see what other BG readings have been.  This am BG was 173 but increase to 429 after breakfast of cereal and coffee with cream.  They has given 8 units of Regular insulin about 45 to 60 minutes ago.  He is about to eat lunch and will check BG and let us know if BG is still over 200.   Recommended increase Tresiba to 35 units once daily.  Continue to use Regular insulin as needed prior to meal for elevated BG:   If blood glucose is 200 to 250 - give 3 units             If blood glucose is 251 to 300 - give 5 units             If blood glucose if 301 or above - give 8 units. Will follow up BG tomorrow and adjust insulin again as needed.

## 2016-04-28 NOTE — Telephone Encounter (Signed)
Incoming call from pt's wife to inform pt's BG is [place] to use Novolin per sliding scale per Lynelle Smoke Eckard's last note (8u >301) Pt's wife verbalizes understanding

## 2016-04-29 ENCOUNTER — Encounter: Payer: Self-pay | Admitting: Cardiology

## 2016-04-29 ENCOUNTER — Telehealth: Payer: Self-pay | Admitting: Pharmacist

## 2016-04-29 ENCOUNTER — Ambulatory Visit (INDEPENDENT_AMBULATORY_CARE_PROVIDER_SITE_OTHER): Payer: Medicare HMO | Admitting: Cardiology

## 2016-04-29 VITALS — BP 110/78 | HR 100 | Ht 71.0 in | Wt 296.1 lb

## 2016-04-29 DIAGNOSIS — I5032 Chronic diastolic (congestive) heart failure: Secondary | ICD-10-CM | POA: Diagnosis not present

## 2016-04-29 DIAGNOSIS — Z79899 Other long term (current) drug therapy: Secondary | ICD-10-CM

## 2016-04-29 DIAGNOSIS — I482 Chronic atrial fibrillation: Secondary | ICD-10-CM | POA: Diagnosis not present

## 2016-04-29 DIAGNOSIS — I4821 Permanent atrial fibrillation: Secondary | ICD-10-CM

## 2016-04-29 NOTE — Patient Instructions (Signed)
Medication Instructions:  Continue current medications  Labwork: CBC and BMP at PCP Office  Testing/Procedures: None Ordered  Follow-Up: Your physician recommends that you schedule a follow-up appointment in: 4 Months.   Any Other Special Instructions Will Be Listed Below (If Applicable).   If you need a refill on your cardiac medications before your next appointment, please call your pharmacy.

## 2016-04-29 NOTE — Telephone Encounter (Signed)
Called to see if BG improved yesterday.   Patient's wife states that they gave 10 units of Regular insulin last night with supper.  This morning BG was 170's and Justin Ruiz took higher [place] dose of 35 units this am.  They have not rechecked BG again today and they are currently at cardiologist's office.  Justin Ruiz will call with more BG readings later today or Monday 05/02/16.

## 2016-04-29 NOTE — Telephone Encounter (Signed)
BG was in 300's after he ate a biscuit and diet lemonade for lunch.  Wife gave him 8 units of regular insulin.  Recommend restart mealtime Regular insulin - 4 units prior to each meal.  Continue Tresiba 35 units once daily.

## 2016-04-30 ENCOUNTER — Inpatient Hospital Stay: Admission: RE | Admit: 2016-04-30 | Payer: Commercial Managed Care - HMO | Source: Ambulatory Visit

## 2016-05-01 ENCOUNTER — Encounter: Payer: Self-pay | Admitting: Cardiology

## 2016-05-02 DIAGNOSIS — R55 Syncope and collapse: Secondary | ICD-10-CM | POA: Diagnosis not present

## 2016-05-02 DIAGNOSIS — R413 Other amnesia: Secondary | ICD-10-CM | POA: Diagnosis not present

## 2016-05-03 ENCOUNTER — Ambulatory Visit (INDEPENDENT_AMBULATORY_CARE_PROVIDER_SITE_OTHER): Payer: Self-pay

## 2016-05-03 ENCOUNTER — Ambulatory Visit: Payer: Self-pay

## 2016-05-03 ENCOUNTER — Telehealth: Payer: Self-pay | Admitting: Family Medicine

## 2016-05-03 DIAGNOSIS — IMO0002 Reserved for concepts with insufficient information to code with codable children: Secondary | ICD-10-CM

## 2016-05-03 DIAGNOSIS — Z0289 Encounter for other administrative examinations: Secondary | ICD-10-CM

## 2016-05-03 DIAGNOSIS — I4891 Unspecified atrial fibrillation: Secondary | ICD-10-CM | POA: Diagnosis not present

## 2016-05-03 DIAGNOSIS — I5031 Acute diastolic (congestive) heart failure: Secondary | ICD-10-CM | POA: Diagnosis not present

## 2016-05-03 DIAGNOSIS — C8332 Diffuse large B-cell lymphoma, intrathoracic lymph nodes: Secondary | ICD-10-CM

## 2016-05-03 DIAGNOSIS — E1165 Type 2 diabetes mellitus with hyperglycemia: Secondary | ICD-10-CM | POA: Diagnosis not present

## 2016-05-03 DIAGNOSIS — I110 Hypertensive heart disease with heart failure: Secondary | ICD-10-CM | POA: Diagnosis not present

## 2016-05-03 DIAGNOSIS — C8512 Unspecified B-cell lymphoma, intrathoracic lymph nodes: Secondary | ICD-10-CM | POA: Diagnosis not present

## 2016-05-03 DIAGNOSIS — J9621 Acute and chronic respiratory failure with hypoxia: Secondary | ICD-10-CM

## 2016-05-03 DIAGNOSIS — J449 Chronic obstructive pulmonary disease, unspecified: Secondary | ICD-10-CM | POA: Diagnosis not present

## 2016-05-03 DIAGNOSIS — M1990 Unspecified osteoarthritis, unspecified site: Secondary | ICD-10-CM | POA: Diagnosis not present

## 2016-05-03 DIAGNOSIS — G4733 Obstructive sleep apnea (adult) (pediatric): Secondary | ICD-10-CM

## 2016-05-03 MED ORDER — INSULIN NPH (HUMAN) (ISOPHANE) 100 UNIT/ML ~~LOC~~ SUSP: 40.0000 [IU] | Freq: Every day | SUBCUTANEOUS | 1 refills | Status: DC

## 2016-05-03 MED ORDER — INSULIN REGULAR HUMAN 100 UNIT/ML IJ SOLN: INTRAMUSCULAR | 1 refills | Status: DC

## 2016-05-03 MED ORDER — INSULIN NPH (HUMAN) (ISOPHANE) 100 UNIT/ML ~~LOC~~ SUSP: 40.0000 [IU] | Freq: Every day | SUBCUTANEOUS | 11 refills | Status: DC

## 2016-05-03 MED ORDER — INSULIN REGULAR HUMAN 100 UNIT/ML IJ SOLN: INTRAMUSCULAR | 0 refills | Status: DC

## 2016-05-03 NOTE — Telephone Encounter (Signed)
Patient's BG this am was 210.  BG after breakfast was in 300's.  They would like to restart NPH insulin and Reg insulin . They felt that BG was better controlled.  Patient changed to back to NPH 40 units once each morning.  Also restart regular insulin - 6 units prior to each meal.  Follow up in 6 days.

## 2016-05-04 ENCOUNTER — Telehealth: Payer: Self-pay | Admitting: Family Medicine

## 2016-05-04 ENCOUNTER — Other Ambulatory Visit: Payer: Self-pay

## 2016-05-04 NOTE — Telephone Encounter (Signed)
Spoke with pt's wife regarding symptoms Offered earlier appt w/ another provider Pt's wife declined Will keep appt with [facility]

## 2016-05-04 NOTE — Patient Outreach (Signed)
[facility]) Care Management  05/04/2016  KENDRE KOLLMAN December 23, 1950 [id number]  REFERRAL DATE; 04/19/16 REFERRAL SOURCE: [employer] REFERRAL REASON: Recent hospitalization for heart failure, pneumonia CONSENT: HIPAA verified with patient. Patient gave verbal authorization to speak with his wife, Braxdon Rosene regarding all of his persona health information  PROVIDERS:  Dr. Redge Gainer - primary MD Dr. Baird Lyons - pulmonologist Dr. Minus Breeding - cardiologist [facility]: nursing services.   SOCIAL:  Patient lives with his wife, Demarlo Bhola Patient is independent in ADL's per wife but must be verbally cued. Patient requires assistance with IADL's per wife.   Telephone call to patient/ wife regarding transition of care follow up.  Unable to reach. HIPAA compliant voice message left with call back phone number.   PLAN; RNCM will attempt 2nd telephone outreach within 1 week.   [name] RN,BSN,CCM [facility] Telephonic  [phone]

## 2016-05-09 ENCOUNTER — Ambulatory Visit (INDEPENDENT_AMBULATORY_CARE_PROVIDER_SITE_OTHER): Payer: Medicare HMO

## 2016-05-09 ENCOUNTER — Encounter: Payer: Self-pay | Admitting: Family Medicine

## 2016-05-09 ENCOUNTER — Ambulatory Visit (INDEPENDENT_AMBULATORY_CARE_PROVIDER_SITE_OTHER): Payer: Medicare HMO | Admitting: Family Medicine

## 2016-05-09 ENCOUNTER — Ambulatory Visit: Payer: Self-pay

## 2016-05-09 VITALS — BP 110/71 | HR 89 | Temp 96.6°F | Ht 71.0 in | Wt 288.0 lb

## 2016-05-09 DIAGNOSIS — I5032 Chronic diastolic (congestive) heart failure: Secondary | ICD-10-CM

## 2016-05-09 DIAGNOSIS — J9611 Chronic respiratory failure with hypoxia: Secondary | ICD-10-CM | POA: Diagnosis not present

## 2016-05-09 DIAGNOSIS — R0602 Shortness of breath: Secondary | ICD-10-CM | POA: Diagnosis not present

## 2016-05-09 DIAGNOSIS — I482 Chronic atrial fibrillation, unspecified: Secondary | ICD-10-CM

## 2016-05-09 DIAGNOSIS — E119 Type 2 diabetes mellitus without complications: Secondary | ICD-10-CM | POA: Diagnosis not present

## 2016-05-09 DIAGNOSIS — R5383 Other fatigue: Secondary | ICD-10-CM

## 2016-05-09 DIAGNOSIS — R2681 Unsteadiness on feet: Secondary | ICD-10-CM | POA: Diagnosis not present

## 2016-05-09 DIAGNOSIS — Z794 Long term (current) use of insulin: Secondary | ICD-10-CM

## 2016-05-09 DIAGNOSIS — C8332 Diffuse large B-cell lymphoma, intrathoracic lymph nodes: Secondary | ICD-10-CM | POA: Diagnosis not present

## 2016-05-09 LAB — CBC WITH DIFFERENTIAL/PLATELET
BASOS: 0 %
Basophils Absolute: 0 10*3/uL (ref 0.0–0.2)
EOS (ABSOLUTE): 0.1 10*3/uL (ref 0.0–0.4)
EOS: 1 %
HEMATOCRIT: 43.5 % (ref 37.5–51.0)
HEMOGLOBIN: 14.2 g/dL (ref 13.0–17.7)
IMMATURE GRANULOCYTES: 0 %
Immature Grans (Abs): 0 10*3/uL (ref 0.0–0.1)
LYMPHS ABS: 1.3 10*3/uL (ref 0.7–3.1)
Lymphs: 16 %
MCH: 29.1 pg (ref 26.6–33.0)
MCHC: 32.6 g/dL (ref 31.5–35.7)
MCV: 89 fL (ref 79–97)
MONOCYTES: 7 %
MONOS ABS: 0.5 10*3/uL (ref 0.1–0.9)
Neutrophils Absolute: 6.2 10*3/uL (ref 1.4–7.0)
Neutrophils: 76 %
Platelets: 333 10*3/uL (ref 150–379)
RBC: 4.88 x10E6/uL (ref 4.14–5.80)
RDW: 14.1 % (ref 12.3–15.4)
WBC: 8.1 10*3/uL (ref 3.4–10.8)

## 2016-05-09 LAB — BMP8+EGFR
BUN/Creatinine Ratio: 23 (ref 10–24)
BUN: 26 mg/dL (ref 8–27)
CALCIUM: 9 mg/dL (ref 8.6–10.2)
CO2: 30 mmol/L — ABNORMAL HIGH (ref 18–29)
CREATININE: 1.11 mg/dL (ref 0.76–1.27)
Chloride: 90 mmol/L — ABNORMAL LOW (ref 96–106)
GFR, EST AFRICAN AMERICAN: 80 mL/min/{1.73_m2} (ref >59)
GFR, EST NON AFRICAN AMERICAN: 69 mL/min/{1.73_m2} (ref >59)
Glucose: 349 mg/dL — ABNORMAL HIGH (ref 65–99)
POTASSIUM: 4.2 mmol/L (ref 3.5–5.2)
Sodium: 135 mmol/L (ref 134–144)

## 2016-05-09 NOTE — Addendum Note (Signed)
Addended by: Zannie Cove on: 05/09/2016 11:46 AM   Modules accepted: Orders

## 2016-05-09 NOTE — Progress Notes (Addendum)
Subjective:    Patient ID: Justin Ruiz, male    DOB: 05-19-1950, 66 y.o.   MRN: [id number]  HPI  Patient here today for 4 week follow up form recent hospital discharge We will do a CXR today and he is accompanied by his wife.The patient has been to see the neurologist and his MRI of the brain was reviewed today and will be reviewed with him during the visit today. The cardiologist feels that things are stable as far as his heart is concerned and her asking him to monitor his weights regularly. His pulse ox is 98% on 2 L. His weight remains at 288. His body mass index is still in the morbid obese range. He is complaining today of feeling fatigued and drained and short of breath. The patient does have a follow-up visit scheduled with his oncologist this week. He also has an EEG planned to end of the month. The patient and his wife are very frustrated feeling that something has changed but no one is been able to determine what has changed at this point in time. He is getting home health visits and we will see if we can arrange for him to have some home physical therapy. The wife says that his cognitive function continues to be diminished compared to what it was prior to his hospitalization. She indicates that he is pretty much 24 hour care. He does not sleep in the bed and sleeps in his recliner. He continues to have shortness of breath but no chest pain. He denies any trouble with nausea vomiting diarrhea or trouble passing his water. The patient seems somewhat frustrated with his condition.     Patient Active Problem List   Diagnosis Date Noted  . Memory loss 04/13/2016  . Hypomagnesemia   . Morbid obesity ([place]) 03/29/2016  . Hypoglycemia 03/28/2016  . Acute encephalopathy 03/28/2016  . Acute diastolic heart failure ([place]) 03/28/2016  . COPD (chronic obstructive pulmonary disease) ([place]) 03/28/2016  . Atrial fibrillation with RVR ([place]) 03/28/2016  . Acute on chronic respiratory failure ([place])  03/28/2016  . 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 Chronic diastolic heart failure ([place])   . CAP (community acquired pneumonia)   . Acute-on-chronic respiratory failure ([place]) 06/07/2015  . Sepsis ([place]) 06/07/2015  . Diabetic neuropathy, type II diabetes mellitus ([place]) 09/25/2014  . Chronic respiratory failure with hypoxia ([place]) 05/25/2014  . Obstructive sleep apnea 05/25/2014  . Benign neoplasm of ascending colon 05/05/2014  . Excessive daytime sleepiness 01/24/2014  . Mediastinal adenopathy 06/20/2013  . Severe obesity (BMI >= 40) ([place]) 05/01/2013  . Generalized anxiety disorder 05/01/2013  . Lung nodules 03/11/2013  . Chronic anticoagulation 08/20/2012  . FIBRILLATION, ATRIAL 05/12/2010  . NHL (non-Hodgkin's lymphoma) ([place]) 01/26/2010  . Obesity, morbid ([place]) 04/27/2009  . CARDIOVASCULAR STUDIES, ABNORMAL 04/27/2009  . ABNORMAL STRESS ELECTROCARDIOGRAM 03/25/2009  . PERSONAL HISTORY OF COLONIC POLYPS 03/02/2009  . Diabetes mellitus type 2, insulin dependent ([place]) 04/29/2007  . Hyperlipidemia 04/29/2007  . Hypertension 04/29/2007  . Seasonal and perennial allergic rhinitis 04/29/2007  . COPD exacerbation ([place]) 04/29/2007  . G E R D 04/29/2007  . BENIGN PROSTATIC HYPERTROPHY, HX OF 04/29/2007   Outpatient Encounter Prescriptions as of 05/09/2016  Medication Sig  . acetaminophen (TYLENOL) 500 MG tablet Take 1,000 mg by mouth every 6 (six) hours as needed for headache. Reported on 10/08/2015  . albuterol (PROAIR HFA) 108 (90  Base) MCG/ACT inhaler Inhale 2 puffs into the lungs every 6 (six) hours as needed for wheezing or shortness of breath.  . ALPRAZolam (XANAX) 0.25 MG tablet Take 1 tablet (0.25 mg total) by mouth at bedtime.  . Blood Glucose Calibration (TAI DOC CONTROL) NORMAL SOLN Use as directed.  . Blood Glucose Monitoring Suppl (CLEVER CHEK AUTO-CODE VOICE) DEVI USE AS  DIRECTED  . Cetirizine HCl 10 MG CAPS Take 1 capsule (10 mg total) by mouth at bedtime as needed. For itching  . Cholecalciferol (VITAMIN D3) 5000 UNITS TABS Take 1 tablet by mouth every morning.  . furosemide (LASIX) 40 MG tablet Take 1 tablet (40 mg total) by mouth daily.  Marland Kitchen guaiFENesin (MUCINEX) 600 MG 12 hr tablet Take 600 mg by mouth 2 (two) times daily as needed for cough or to loosen phlegm.  . insulin NPH Human (NOVOLIN N) 100 UNIT/ML injection Inject 0.4 mLs (40 Units total) into the skin daily before breakfast.  . insulin regular (NOVOLIN R,HUMULIN R) 250 units/2.64m (100 units/mL) injection 6 units prior to each meal  . ipratropium-albuterol (DUONEB) 0.5-2.5 (3) MG/3ML SOLN PLACE 1 VIAL (3 MLS) IN THE NEBULIZER EVERY 6 HOURS AS NEEDED  . Lancet Devices (LANCING DEVICE) MISC USE AS DIRECTED  . metFORMIN (GLUCOPHAGE) 500 MG tablet TAKE ONE TABLET BY MOUTH IN THE MORNING AND TAKE TWO TABLETS IN THE EVENING  . metoprolol (LOPRESSOR) 100 MG tablet Take 1 tablet (100 mg total) by mouth 2 (two) times daily.  . simvastatin (ZOCOR) 40 MG tablet TAKE ONE TABLET BY MOUTH ONCE DAILY  . triamcinolone cream (KENALOG) 0.1 % Apply to arms and legs bid for itching / rash  . warfarin (COUMADIN) 5 MG tablet TAKE ONE & ONE-HALF TABLETS BY MOUTH ONCE DAILY EVERY MONDAY AND EVERY FRIDAY THEN TAKE ONE  TABLET THE REST OF THE WEEK   No facility-administered encounter medications on file as of 05/09/2016.      Review of Systems  Constitutional: Positive for fatigue.  HENT: Negative.   Eyes: Negative.   Respiratory: Positive for shortness of breath.   Cardiovascular: Negative.   Gastrointestinal: Negative.   Endocrine: Negative.   Genitourinary: Negative.   Musculoskeletal: Negative.   Skin: Negative.   Allergic/Immunologic: Negative.   Neurological: Negative.   Hematological: Negative.   Psychiatric/Behavioral: Positive for confusion.       Objective:   Physical Exam  Constitutional: He is  oriented to person, place, and time. He appears well-developed and well-nourished.  Obese and in a wheelchair with O2. Patient seems somewhat frustrated but responded appropriately to questions asked of him. He admits to not feeling like he is to and he obviously is somewhat depressed about this.  HENT:  Head: Normocephalic and atraumatic.  Right Ear: External ear normal.  Left Ear: External ear normal.  Nose: Nose normal.  Mouth/Throat: Oropharynx is clear and moist. No oropharyngeal exudate.  Eyes: Conjunctivae and EOM are normal. Pupils are equal, round, and reactive to light. Right eye exhibits no discharge. Left eye exhibits no discharge. No scleral icterus.  Neck: Normal range of motion. Neck supple. No thyromegaly present.  Cardiovascular: Normal rate and intact distal pulses.   No murmur heard. Irregular at 84/m  Pulmonary/Chest: Effort normal. No respiratory distress. He has wheezes. He has no rales.  Diminished breath sounds bilaterally. With coughing there was minimal congestion. There were some wheezes and rhonchi with his routine breathing.  Abdominal: Soft. Bowel sounds are normal. He exhibits no mass. There is no  tenderness. There is no rebound and no guarding.  Abdominal obesity without masses or tenderness  Musculoskeletal: He exhibits no edema.  Lymphadenopathy:    He has no cervical adenopathy.  Neurological: He is alert and oriented to person, place, and time.  Skin: Skin is warm and dry. No rash noted.  Psychiatric: His behavior is normal. Judgment and thought content normal.  He was not argumentative or combative and anyway. The patient's mood was flat.  Nursing note and vitals reviewed.  BP 110/71 (BP Location: Left Arm)   Pulse 89   Temp (!) 96.6 F (35.9 C) (Oral)   Ht '5\' 11"'  (1.803 m)   Wt 288 lb (130.6 kg)   SpO2 98% Comment: on 2 liters OXYGEN  BMI 40.17 kg/m         Assessment & Plan:  1. SOB (shortness of breath) -The pulse ox was 98% on 2 L of  O2. - DG Chest 2 View; Future  2. Chronic atrial fibrillation (HCC) -Get BMP today and follow-up with cardiology as planned  3. Diabetes mellitus type 2, insulin dependent ([place]) -The blood sugars have been under better control on his current regimen. Wife says that his feet are good without any sores.  4. Chronic diastolic heart failure ([place]) -Follow-up with cardiology as planned  5. Morbid obesity due to excess calories ([place]) -Continued efforts at weight loss through diet and exercise as much as possible  6. Chronic respiratory failure with hypoxia [facility]) -Follow-up with pulmonology as planned -The patient needs a hospital bed. He will need to have this elevated at 45 for the head.  7. 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 Lethargy and depressed affect and cognitive changes since his hospitalization. -The patient has seen the neurologist and the cardiologist and we will arrange for him to see the pulmonologist. He still has an EEG that is pending. No definite cause has been found for a the neurological changes especially in the cognitive area that have occurred since the hospitalization.  Patient Instructions  Continue to follow-up as planned with cardiology oncology and pulmonology. We will arrange to get physical therapy to come in the house and tried to rejuvenate his energy level with more activity that is safe.  Arrie Senate MD

## 2016-05-09 NOTE — Addendum Note (Signed)
Addended by: Zannie Cove on: 05/09/2016 12:06 PM   Modules accepted: Orders

## 2016-05-09 NOTE — Patient Instructions (Addendum)
Continue to follow-up as planned with cardiology oncology and pulmonology. We will arrange to get physical therapy to come in the house and tried to rejuvenate his energy level with more activity that is safe.

## 2016-05-10 ENCOUNTER — Telehealth: Payer: Self-pay | Admitting: Internal Medicine

## 2016-05-10 DIAGNOSIS — G4733 Obstructive sleep apnea (adult) (pediatric): Secondary | ICD-10-CM | POA: Diagnosis not present

## 2016-05-10 DIAGNOSIS — J449 Chronic obstructive pulmonary disease, unspecified: Secondary | ICD-10-CM | POA: Diagnosis not present

## 2016-05-10 DIAGNOSIS — C8512 Unspecified B-cell lymphoma, intrathoracic lymph nodes: Secondary | ICD-10-CM | POA: Diagnosis not present

## 2016-05-10 DIAGNOSIS — I5031 Acute diastolic (congestive) heart failure: Secondary | ICD-10-CM | POA: Diagnosis not present

## 2016-05-10 DIAGNOSIS — I4891 Unspecified atrial fibrillation: Secondary | ICD-10-CM | POA: Diagnosis not present

## 2016-05-10 DIAGNOSIS — E1165 Type 2 diabetes mellitus with hyperglycemia: Secondary | ICD-10-CM | POA: Diagnosis not present

## 2016-05-10 DIAGNOSIS — M1990 Unspecified osteoarthritis, unspecified site: Secondary | ICD-10-CM | POA: Diagnosis not present

## 2016-05-10 DIAGNOSIS — I110 Hypertensive heart disease with heart failure: Secondary | ICD-10-CM | POA: Diagnosis not present

## 2016-05-10 NOTE — Telephone Encounter (Signed)
Spoke with pt's wife, [name]. States that pt needs an appointment with [facility] per his PCP. Advised her that CY does not have any available appointments any time soon. Pt has been scheduled with SG on 05/16/16 at 10:30am. Nothing further was needed.

## 2016-05-11 ENCOUNTER — Telehealth: Payer: Self-pay | Admitting: Internal Medicine

## 2016-05-11 DIAGNOSIS — J439 Emphysema, unspecified: Secondary | ICD-10-CM | POA: Diagnosis not present

## 2016-05-11 DIAGNOSIS — J9611 Chronic respiratory failure with hypoxia: Secondary | ICD-10-CM | POA: Diagnosis not present

## 2016-05-11 DIAGNOSIS — J449 Chronic obstructive pulmonary disease, unspecified: Secondary | ICD-10-CM | POA: Diagnosis not present

## 2016-05-11 NOTE — Telephone Encounter (Signed)
Pt r/s appt on 1/18 to 125 due weather conditions. Gave pt new appt date/time

## 2016-05-12 ENCOUNTER — Other Ambulatory Visit: Payer: Commercial Managed Care - HMO

## 2016-05-12 ENCOUNTER — Ambulatory Visit: Payer: Commercial Managed Care - HMO | Admitting: Internal Medicine

## 2016-05-13 ENCOUNTER — Ambulatory Visit: Payer: Self-pay

## 2016-05-16 ENCOUNTER — Telehealth: Payer: Self-pay | Admitting: Family Medicine

## 2016-05-16 ENCOUNTER — Encounter: Payer: Self-pay | Admitting: Acute Care

## 2016-05-16 ENCOUNTER — Ambulatory Visit (INDEPENDENT_AMBULATORY_CARE_PROVIDER_SITE_OTHER): Payer: Medicare HMO | Admitting: Acute Care

## 2016-05-16 VITALS — BP 118/76 | HR 85 | Ht 71.0 in | Wt 291.6 lb

## 2016-05-16 DIAGNOSIS — J441 Chronic obstructive pulmonary disease with (acute) exacerbation: Secondary | ICD-10-CM

## 2016-05-16 DIAGNOSIS — I509 Heart failure, unspecified: Secondary | ICD-10-CM

## 2016-05-16 DIAGNOSIS — R0602 Shortness of breath: Secondary | ICD-10-CM

## 2016-05-16 DIAGNOSIS — G4733 Obstructive sleep apnea (adult) (pediatric): Secondary | ICD-10-CM

## 2016-05-16 MED ORDER — UMECLIDINIUM-VILANTEROL 62.5-25 MCG/INH IN AEPB: 1.0000 | INHALATION_SPRAY | Freq: Every day | RESPIRATORY_TRACT | 0 refills | Status: DC

## 2016-05-16 NOTE — Progress Notes (Signed)
Patient seen in the office today and instructed on use of Anoro.  Patient expressed understanding and demonstrated technique. Benetta Spar [facility]: Lambert Rhonda W 05/16/2016

## 2016-05-16 NOTE — Progress Notes (Signed)
History of Present Illness Justin Ruiz is a 66 y.o. male former smoker  with COPD ( Home O2 at 2 L [place]), OSA ( Non-Compliant with CPAP), complicated by hx lymphoma, AFib/ coumadin.He is followed by Dr. Annamaria Boots. PFT 11/23/06- 0.47.   Hx 35 pk yr cigs. LOV- 01/04/2016.    05/16/2016 Hospital Follow Up  Pt. Presents today for hospital follow up. He was hospitalized 12/4-12/02/2016 . He was found  unresponsive and hypoglycemic the morning of admission. He  had cough and cold symptoms for 1 week prior to admission. He was found to have respiratory failure due to pneumonia/CHF. He was started on bipap for hypercapnea and  improved. He was diuresed 18 L  Of fluid for CHF and completed antibiotic treatment   for pneumonia.CXR showed resolved pneumonia 05/09/2016.  He was discharged on 7 days of Levaquin and  a prednisone taper, which he completed.Per his wife he is compliant with his medications. He checks his blood sugars three times daily. He is weighing himself daily. He has instructions for extra lasix dosing for weight gain. She is concerned that he has continued memory loss. We discussed that he has many underlying illnesses that can cause altered mental status.We also discussed that he is not compliant with his oxygen. His wife states that he often does not wear it, and that his saturations sometimes drop into the 70's at home. He does not use a maintenance inhaler for his COPD. He does have a rescue inhaler. Additionally he is not compliant with his CPAP use. He is not wearing it at all. Per his wife he refuses to wear it. We had a long discussion about his CHF and how not wearing CPAP and treating his OSA causes additional strain on his heart, and can worsen his CHF. We also discussed that it increases risk of stroke and hypertension in addition to contributing to sleeplessness and fatigue.He states that the mask is uncomfortable.He wanted his oxygen saturation checked today on room air. It was 88%. He was  then placed on his 2 L and saturations rebounded to 94%.He has home health nursing on board. He is getting ready to start home physical therapy. He is scheduled for an EEG on 05/23/2016, with follow up by Dr. Krista Blue, neurology. He denies chest pain, fever, orthopnea or hemoptysis.  Tests 05/09/2016>>IMPRESSION: No acute cardiopulmonary disease. Right pleuroparenchymal scarring versus tiny amount of pleural fluid.  Past medical hx Past Medical History:  Diagnosis Date  . Arthritis   . Asthma   . BPH (benign prostatic hypertrophy)   . Colon polyps   . COPD (chronic obstructive pulmonary disease) ([place])   . Diabetes mellitus   . GERD (gastroesophageal reflux disease)   . History of oxygen administration    occ. oxygen use at 2 l/m as needed- not using at this time  . HTN (hypertension)    x 3 years  . Hyperlipidemia   . Lung nodules 03/11/2013  . Mediastinal adenopathy 06/20/2013   CT  & PET 2/15   . Memory loss   . 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 NHL (non-Hodgkin's lymphoma) ([place])    nhl dx 9/11  . Obesity    exogenous  . Shingles   . Sleep apnea    recent study 04-07-14 awaiting results, no cpap yet.-Dr. Glynn Octave     Past surgical hx, Family hx, Social hx all reviewed.  Current Outpatient Prescriptions on File Prior to Visit  Medication Sig  .  acetaminophen (TYLENOL) 500 MG tablet Take 1,000 mg by mouth every 6 (six) hours as needed for headache. Reported on 10/08/2015  . albuterol (PROAIR HFA) 108 (90 Base) MCG/ACT inhaler Inhale 2 puffs into the lungs every 6 (six) hours as needed for wheezing or shortness of breath.  . ALPRAZolam (XANAX) 0.25 MG tablet Take 1 tablet (0.25 mg total) by mouth at bedtime.  . Blood Glucose Calibration (TAI DOC CONTROL) NORMAL SOLN Use as directed.  . Blood Glucose Monitoring Suppl (CLEVER CHEK AUTO-CODE VOICE) DEVI USE AS DIRECTED  . Cetirizine HCl 10 MG CAPS Take 1 capsule (10 mg total) by mouth at bedtime as  needed. For itching  . Cholecalciferol (VITAMIN D3) 5000 UNITS TABS Take 1 tablet by mouth every morning.  . furosemide (LASIX) 40 MG tablet Take 1 tablet (40 mg total) by mouth daily.  Marland Kitchen guaiFENesin (MUCINEX) 600 MG 12 hr tablet Take 600 mg by mouth 2 (two) times daily as needed for cough or to loosen phlegm.  . insulin NPH Human (NOVOLIN N) 100 UNIT/ML injection Inject 0.4 mLs (40 Units total) into the skin daily before breakfast.  . insulin regular (NOVOLIN R,HUMULIN R) 250 units/2.59m (100 units/mL) injection 6 units prior to each meal  . ipratropium-albuterol (DUONEB) 0.5-2.5 (3) MG/3ML SOLN PLACE 1 VIAL (3 MLS) IN THE NEBULIZER EVERY 6 HOURS AS NEEDED  . Lancet Devices (LANCING DEVICE) MISC USE AS DIRECTED  . metFORMIN (GLUCOPHAGE) 500 MG tablet TAKE ONE TABLET BY MOUTH IN THE MORNING AND TAKE TWO TABLETS IN THE EVENING  . metoprolol (LOPRESSOR) 100 MG tablet Take 1 tablet (100 mg total) by mouth 2 (two) times daily.  . simvastatin (ZOCOR) 40 MG tablet TAKE ONE TABLET BY MOUTH ONCE DAILY  . triamcinolone cream (KENALOG) 0.1 % Apply to arms and legs bid for itching / rash  . warfarin (COUMADIN) 5 MG tablet TAKE ONE & ONE-HALF TABLETS BY MOUTH ONCE DAILY EVERY MONDAY AND EVERY FRIDAY THEN TAKE ONE  TABLET THE REST OF THE WEEK  . [DISCONTINUED] digoxin (LANOXIN) 0.25 MG tablet Take 250 mcg by mouth daily.    No current facility-administered medications on file prior to visit.      Allergies  Allergen Reactions  . Penicillins     Unknown reaction  . Avapro [Irbesartan] Other (See Comments)    "doesnt sit right with me"--light headed  . Lipitor [Atorvastatin Calcium] Other (See Comments)    Leg pain    Review Of Systems:  Constitutional:   +  weight loss, No night sweats,  Fevers, chills,++ fatigue, or++  lassitude.  HEENT:   No headaches,  Difficulty swallowing,  Tooth/dental problems, or  Sore throat,                No sneezing, itching, ear ache, nasal congestion, post nasal  drip,   CV:  No chest pain,  Orthopnea, PND, swelling in lower extremities, anasarca, dizziness, palpitations, syncope.   GI  No heartburn, indigestion, abdominal pain, nausea, vomiting, diarrhea, change in bowel habits, +loss of appetite,No  bloody stools.   Resp: + shortness of breath with exertion or at rest.  No excess mucus, no productive cough,  No non-productive cough,  No coughing up of blood.  No change in color of mucus.  No wheezing.  No chest wall deformity  Skin: no rash or lesions.  GU: no dysuria, change in color of urine, no urgency or frequency.  No flank pain, no hematuria   MS:  No joint  pain or swelling.  No decreased range of motion.  No back pain.  Psych:  No change in mood or affect. No depression or anxiety.  No memory loss.   Vital Signs BP 118/76 (BP Location: Left Arm, Patient Position: Sitting, Cuff Size: Normal)   Pulse 85   Ht '5\' 11"'  (1.803 m)   Wt 291 lb 9.6 oz (132.3 kg)   SpO2 (!) 88%   BMI 40.67 kg/m    Physical Exam:  General- No distress,  A&Ox3, obese, in NAD ENT: No sinus tenderness, TM clear, pale nasal mucosa, no oral exudate,no post nasal drip, no LAN Cardiac: S1, S2, regular rate and rhythm, no murmur Chest: No wheeze/ rales/ dullness; no accessory muscle use, no nasal flaring, no sternal retractions Abd.: Soft Non-tender, obese Ext: No clubbing cyanosis, trace ankle edema bilaterally Neuro:  Deconditioned, in wheel chair Skin: No rashes, warm and dry Psych: Flat affect, appears depressed. Per wife this is his baseline   Assessment/Plan  COPD exacerbation (HCC) Resolving flare after admission for exacerbation with CHF Diuresed 18 L during admission Plan: CXR was clear on 05/09/16. Continue wearing your oxygen at 2 L [place] as instructed. Your oxygen saturation goal is 88-92% Follow up with Dr. Annamaria Boots 07/04/2016. We will place an order for mask refitting. Continue your Lasix  And weights daily and follow the instructions of your  cardiologist. Continue checking blood sugars 3 times daily. EEG 05/23/16 as scheduled Continue your rescue inhaler as you have been doing. We will add Anoro as a therapeutic trial for the next month. This is a maintenance inhaler. Take one puff daily. We will provide samples today. Dr. Annamaria Boots will evaluate benefit at visit 07/04/16. Please contact office for sooner follow up if symptoms do not improve or worsen or seek emergency care    Obstructive sleep apnea Non-Compliance with CPAP ( per wife refuses to wear) Recent hospitalization for CHF diuresed 18 L Plan Continue wearing your oxygen at 2 L [place] as instructed. Your oxygen saturation goal is 88-92% Follow up with Dr. Annamaria Boots 07/04/2016. We will place an order for mask refitting. You need to wear your CPAP every night. Goal is 4-6 hours. Continue your Lasix  And weights daily and follow the instructions of your cardiologist. EEG 05/23/16 as scheduled Follow up with Dr. Annamaria Boots  07/04/16. Please contact office for sooner follow up if symptoms do not improve or worsen or seek emergency care      Magdalen Spatz, NP 05/16/2016  5:46 PM

## 2016-05-16 NOTE — Patient Instructions (Addendum)
It is good to see you today. CXR was clear on 05/09/16. Continue wearing your oxygen at 2 L [place] as instructed. Your oxygen saturation goal is 88-92% Follow up with Dr. Annamaria Boots 07/04/2016. We will place an order for mask refitting. You need to wear your CPAP every night. Goal is 4-6 hours. Continue your Lasix  And weights daily and follow the instructions of your cardiologist. Continue checking blood sugars 3 times daily. EEG 05/23/16 as scheduled Continue your rescue inhaler as you have been doing. We will add Anoro as a therapeutic trial for the next month. This is a maintenance inhaler. Take one puff daily. We will provide samples today. Dr. Annamaria Boots will evaluate benefit at visit 07/04/16. Please contact office for sooner follow up if symptoms do not improve or worsen or seek emergency care

## 2016-05-16 NOTE — Assessment & Plan Note (Signed)
Non-Compliance with CPAP ( per wife refuses to wear) Recent hospitalization for CHF diuresed 18 L Plan Continue wearing your oxygen at 2 L [place] as instructed. Your oxygen saturation goal is 88-92% Follow up with Dr. Annamaria Boots 07/04/2016. We will place an order for mask refitting. You need to wear your CPAP every night. Goal is 4-6 hours. Continue your Lasix  And weights daily and follow the instructions of your cardiologist. EEG 05/23/16 as scheduled Follow up with Dr. Annamaria Boots  07/04/16. Please contact office for sooner follow up if symptoms do not improve or worsen or seek emergency care

## 2016-05-16 NOTE — Assessment & Plan Note (Signed)
Resolving flare after admission for exacerbation with CHF Diuresed 18 L during admission Plan: CXR was clear on 05/09/16. Continue wearing your oxygen at 2 L [place] as instructed. Your oxygen saturation goal is 88-92% Follow up with Dr. Annamaria Boots 07/04/2016. We will place an order for mask refitting. Continue your Lasix  And weights daily and follow the instructions of your cardiologist. Continue checking blood sugars 3 times daily. EEG 05/23/16 as scheduled Continue your rescue inhaler as you have been doing. We will add Anoro as a therapeutic trial for the next month. This is a maintenance inhaler. Take one puff daily. We will provide samples today. Dr. Annamaria Boots will evaluate benefit at visit 07/04/16. Please contact office for sooner follow up if symptoms do not improve or worsen or seek emergency care

## 2016-05-17 DIAGNOSIS — J449 Chronic obstructive pulmonary disease, unspecified: Secondary | ICD-10-CM | POA: Diagnosis not present

## 2016-05-17 DIAGNOSIS — E1165 Type 2 diabetes mellitus with hyperglycemia: Secondary | ICD-10-CM | POA: Diagnosis not present

## 2016-05-17 DIAGNOSIS — G4733 Obstructive sleep apnea (adult) (pediatric): Secondary | ICD-10-CM | POA: Diagnosis not present

## 2016-05-17 DIAGNOSIS — C8512 Unspecified B-cell lymphoma, intrathoracic lymph nodes: Secondary | ICD-10-CM | POA: Diagnosis not present

## 2016-05-17 DIAGNOSIS — M1990 Unspecified osteoarthritis, unspecified site: Secondary | ICD-10-CM | POA: Diagnosis not present

## 2016-05-17 DIAGNOSIS — I110 Hypertensive heart disease with heart failure: Secondary | ICD-10-CM | POA: Diagnosis not present

## 2016-05-17 DIAGNOSIS — I4891 Unspecified atrial fibrillation: Secondary | ICD-10-CM | POA: Diagnosis not present

## 2016-05-17 DIAGNOSIS — I5031 Acute diastolic (congestive) heart failure: Secondary | ICD-10-CM | POA: Diagnosis not present

## 2016-05-17 NOTE — Telephone Encounter (Signed)
Order placed and will [facility] to [facility] on 05/18/16

## 2016-05-18 ENCOUNTER — Other Ambulatory Visit: Payer: Self-pay | Admitting: Family Medicine

## 2016-05-18 ENCOUNTER — Ambulatory Visit (INDEPENDENT_AMBULATORY_CARE_PROVIDER_SITE_OTHER): Payer: Medicare HMO | Admitting: Pharmacist

## 2016-05-18 VITALS — BP 114/70 | HR 60 | Ht 71.0 in | Wt 290.0 lb

## 2016-05-18 DIAGNOSIS — Z7901 Long term (current) use of anticoagulants: Secondary | ICD-10-CM | POA: Diagnosis not present

## 2016-05-18 DIAGNOSIS — I482 Chronic atrial fibrillation, unspecified: Secondary | ICD-10-CM

## 2016-05-18 DIAGNOSIS — E119 Type 2 diabetes mellitus without complications: Secondary | ICD-10-CM

## 2016-05-18 DIAGNOSIS — Z794 Long term (current) use of insulin: Secondary | ICD-10-CM | POA: Diagnosis not present

## 2016-05-18 LAB — COAGUCHEK XS/INR WAIVED
INR: 3.7 — ABNORMAL HIGH (ref 0.9–1.1)
PROTHROMBIN TIME: 44.4 s

## 2016-05-18 LAB — GLUCOSE HEMOCUE WAIVED: Glu Hemocue Waived: 167 mg/dL — ABNORMAL HIGH (ref 65–99)

## 2016-05-18 NOTE — Patient Instructions (Signed)
Continue Insulin NPH 45 units once each morning  Continue to check blood glucose prior to each meal.  Inject Regular insulin (R insulin) as needed for blood glucose over 200:                         If blood glucose is 200 to 250 - give 3 units                         If blood glucose is 251 to 300 - give 5 units                         If blood glucose if 301 or above - give 8 units.  Call office for insulin adjustment if you have a low reading less than 70 or if you have more then 2 readings per week 250 or above.  Meals - try to keep carbohydrate amount at least 45 grams and up to 60 grams.  Snack - try to keep carbohydrate amount to about 15 to 20 grams.

## 2016-05-18 NOTE — Progress Notes (Signed)
Patient ID: Justin Ruiz, male   DOB: 1951/03/22, 66 y.o.   MRN: [id number]     Subjective:    Justin Ruiz is a 66 y.o. male who presents for a follow up evaluation of Type 2 diabetes mellitus requiring insulin therapy.  [name] was hospitalized 03/28/2016 due to severe hypoglycemia.  He tried [place] for about 2 weeks but BG was increase and patient and wife felt that it was not working better than NPH compared to [employer].  Justin Ruiz reports that BG has been improving over the last 7 days.  He did not bring in glucometer but did bring in written records for a few days.  He checked BG tid.   HBG readings = 188, 18, 164, 145, 168, 223, 139 RBG in office today was 167  Patient reports having 1 hypoglycemic event (was 57) and 1 BG over 300   Current regimen is : metformin 500mg  1 tablet qam and 2 tablets qpm. NPH insulin 45 units qam and Regular insulin as needed for BG over 200 (take 3 units if 200 to 250; 5 units if 251 to 300 and 8 units if 301 or above)   Known diabetic complications: peripheral neuropathy, cardiovascular disease and cerebrovascular disease Cardiovascular risk factors: advanced age (older than 23 for men, 25 for women), diabetes mellitus, dyslipidemia, hypertension, male gender, obesity (BMI >= 30 kg/m2) and sedentary lifestyle  Eye exam current (within one year): yes Weight trend: stable currently Prior visit with CDE: yes  Current diet: in general, a "healthy" diet   - patient with help of wife is following CHO counting diet most days Current exercise: none Medication Compliance?  Yes   Is He on ACE inhibitor or angiotensin II receptor blocker?  No  Lisinopril was stopped in hospital 03/2016.   Patient's wife is also concerned about patient scratching arms and legs.  She states this started about 2 weeks ago.  Observed 4 to 5 areas on left arm and 3 areas on left and right lower legs.   Patient states itches most at night.   Objective:    BP 114/70    Pulse 60   Ht 5\' 11"  (1.803 m)   Wt 290 lb (131.5 kg)   BMI 40.45 kg/m    RBG = 167 INR was3.7 today A1c = 5.9 % (03/2016)  Lab Review Glucose  Date Value  05/09/2016 349 mg/dL (H)  04/08/2016 295 mg/dL (H)  10/08/2015 206 mg/dl (H)  04/09/2015 195 mg/dl (H)  10/14/2014 260 mg/dl (H)  09/03/2012 202 mg/dl (H)  02/24/2012 208 mg/dl (H)   Glucose, Bld (mg/dL)  Date Value  04/03/2016 144 (H)  04/02/2016 96  04/01/2016 136 (H)  11/01/2011 193 (H)  05/11/2011 220 (H)  12/28/2010 161 (H)   Chloride (mEq/L)  Date Value  10/08/2015 100  04/09/2015 102  10/14/2014 101   CO2  Date Value  05/09/2016 30 mmol/L (H)  04/08/2016 27 mmol/L  04/03/2016 34 mmol/L (H)  10/08/2015 31 mEq/L (H)  04/09/2015 29 mEq/L  10/14/2014 29 mEq/L   BUN (mg/dL)  Date Value  05/09/2016 26  04/08/2016 40 (H)  04/03/2016 31 (H)  04/02/2016 33 (H)  04/01/2016 39 (H)  02/17/2016 19  10/08/2015 22.9  04/09/2015 24.9  10/14/2014 21.1   Creatinine (mg/dL)  Date Value  10/08/2015 1.0  04/09/2015 1.0  10/14/2014 0.9   Creatinine, Ser (mg/dL)  Date Value  05/09/2016 1.11  04/08/2016 1.23  04/03/2016 0.85  Assessment:    Diabetes Mellitus type II, under inadequate control. BG is variable and patient is at high risk of hypoglycemia.  Obesity - weight is stable currently supratherapeutic anticoagulation   Plan:    1.  Rx changes:   Continue NPH insulin 45 units qam  Change Regular insulin to sliding scale    If blood glucose is 200 to 250 - give 3 units                         If blood glucose is 251 to 300 - give 5 units                         If blood glucose if 301 or above - give 8 units.  2.  Education: Reviewed 'ABCs' of diabetes management (respective goals in parentheses):  A1C (<8% - given patient's cardiac history of recently hypoglycemic event adjusting A1c goal), blood pressure (<130/80), and cholesterol (LDL <100). 3.  Anticoagulation Dose Instructions as  of 05/18/2016      Dorene Grebe Tue Wed Thu Fri Sat   New Dose 5 mg 7.5 mg 5 mg 5 mg 5 mg 5 mg 5 mg    Description   No warfarin today - Wednesday, January 24th.  Then decrease warfarin dose to 1 tablet daily except take 1 and 1/2 tablets on Mondays only.     4. CHO counting diet discussed.  Reviewed CHO amount in various foods and how to read nutrition labels.  Recommended CHO goal 45 to 60 grams per meal.  15 to 20 grams per snack. 5.  Recommend check BG 3  times a day  Adjusted goal BG due to history of hypoglycemia  FBG goal 100 to 150  Post prandial BG goal is less than 200 6.  Recommended increase physical activity - goal is 150 minutes per week 7. Follow up: 2 weeks

## 2016-05-19 ENCOUNTER — Ambulatory Visit (HOSPITAL_BASED_OUTPATIENT_CLINIC_OR_DEPARTMENT_OTHER): Payer: Medicare HMO | Admitting: Internal Medicine

## 2016-05-19 ENCOUNTER — Other Ambulatory Visit (HOSPITAL_BASED_OUTPATIENT_CLINIC_OR_DEPARTMENT_OTHER): Payer: Medicare HMO

## 2016-05-19 ENCOUNTER — Other Ambulatory Visit: Payer: Self-pay

## 2016-05-19 ENCOUNTER — Encounter: Payer: Self-pay | Admitting: Internal Medicine

## 2016-05-19 ENCOUNTER — Telehealth: Payer: Self-pay | Admitting: Internal Medicine

## 2016-05-19 VITALS — BP 101/64 | HR 101 | Temp 97.4°F | Resp 18 | Ht 71.0 in | Wt 288.0 lb

## 2016-05-19 DIAGNOSIS — C8332 Diffuse large B-cell lymphoma, intrathoracic lymph nodes: Secondary | ICD-10-CM | POA: Diagnosis not present

## 2016-05-19 DIAGNOSIS — D472 Monoclonal gammopathy: Secondary | ICD-10-CM

## 2016-05-19 DIAGNOSIS — Z8572 Personal history of non-Hodgkin lymphomas: Secondary | ICD-10-CM | POA: Diagnosis not present

## 2016-05-19 DIAGNOSIS — R413 Other amnesia: Secondary | ICD-10-CM | POA: Diagnosis not present

## 2016-05-19 LAB — CBC WITH DIFFERENTIAL/PLATELET
BASO%: 0.3 % (ref 0.0–2.0)
Basophils Absolute: 0 10*3/uL (ref 0.0–0.1)
EOS ABS: 0.2 10*3/uL (ref 0.0–0.5)
EOS%: 1.8 % (ref 0.0–7.0)
HEMATOCRIT: 44.9 % (ref 38.4–49.9)
HGB: 15.2 g/dL (ref 13.0–17.1)
LYMPH%: 22.2 % (ref 14.0–49.0)
MCH: 29.5 pg (ref 27.2–33.4)
MCHC: 33.9 g/dL (ref 32.0–36.0)
MCV: 87.2 fL (ref 79.3–98.0)
MONO#: 0.8 10*3/uL (ref 0.1–0.9)
MONO%: 8.1 % (ref 0.0–14.0)
NEUT%: 67.6 % (ref 39.0–75.0)
NEUTROS ABS: 6.4 10*3/uL (ref 1.5–6.5)
NRBC: 0 % (ref 0–0)
PLATELETS: 250 10*3/uL (ref 140–400)
RBC: 5.15 10*6/uL (ref 4.20–5.82)
RDW: 14.2 % (ref 11.0–14.6)
WBC: 9.5 10*3/uL (ref 4.0–10.3)
lymph#: 2.1 10*3/uL (ref 0.9–3.3)

## 2016-05-19 LAB — COMPREHENSIVE METABOLIC PANEL
ALT: 14 U/L (ref 0–55)
AST: 19 U/L (ref 5–34)
Albumin: 3.3 g/dL — ABNORMAL LOW (ref 3.5–5.0)
Alkaline Phosphatase: 90 U/L (ref 40–150)
Anion Gap: 12 mEq/L — ABNORMAL HIGH (ref 3–11)
BILIRUBIN TOTAL: 1.21 mg/dL — AB (ref 0.20–1.20)
BUN: 25.3 mg/dL (ref 7.0–26.0)
CHLORIDE: 94 meq/L — AB (ref 98–109)
CO2: 32 meq/L — AB (ref 22–29)
CREATININE: 1.2 mg/dL (ref 0.7–1.3)
Calcium: 10.4 mg/dL (ref 8.4–10.4)
EGFR: 66 mL/min/{1.73_m2} — ABNORMAL LOW (ref >90)
Glucose: 128 mg/dl (ref 70–140)
Potassium: 4 mEq/L (ref 3.5–5.1)
Sodium: 139 mEq/L (ref 136–145)
TOTAL PROTEIN: 7.8 g/dL (ref 6.4–8.3)

## 2016-05-19 LAB — LACTATE DEHYDROGENASE: LDH: 194 U/L (ref 125–245)

## 2016-05-19 NOTE — Telephone Encounter (Signed)
Left message that RX was ent to [facility] for hospital bed

## 2016-05-19 NOTE — Telephone Encounter (Signed)
Appointment scheduled per 05/19/16 los. Patient was given a copy of the AVS report and appointment schedule per 05/19/16 los. °

## 2016-05-19 NOTE — Progress Notes (Signed)
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 2) hypermetabolic mediastinal lymphadenopathy. 3) monoclonal gammopathy of undetermined significance  PRIOR THERAPY: Status post systemic chemotherapy with 6 cycles of infusional CHOPE with Rituxan.  CURRENT THERAPY: Observation  INTERVAL HISTORY: Justin Ruiz 66 y.o. male came to the clinic today for annual follow-up visit accompanied by his wife. The patient is feeling fine today except for weight loss partially intentional. He denied having any significant night sweats. He was recently treated for pneumonia and hypoglycemia. He has some memory issues and he is currently evaluated by neurology.  He had MRI of the brain that was unremarkable except for small vessel disease. He is also scheduled for EEG. He denied having any chest pain, shortness of breath but has mild cough with no hemoptysis. He has no fever or chills. He has no nausea, vomiting, diarrhea or constipation. He is here today for evaluation with repeat CBC , comprehensive metabolic panel and LDH.  MEDICAL HISTORY: Past Medical History:  Diagnosis Date  . Arthritis   . Asthma   . BPH (benign prostatic hypertrophy)   . Colon polyps   . COPD (chronic obstructive pulmonary disease) ([place])   . Diabetes mellitus   . GERD (gastroesophageal reflux disease)   . History of oxygen administration    occ. oxygen use at 2 l/m as needed- not using at this time  . HTN (hypertension)    x 3 years  . Hyperlipidemia   . Lung nodules 03/11/2013  . Mediastinal adenopathy 06/20/2013   CT  & PET 2/15   . Memory loss   . 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 NHL (non-Hodgkin's lymphoma) ([place])    nhl dx 9/11  . Obesity    exogenous  . Shingles   . Sleep  apnea    recent study 04-07-14 awaiting results, no cpap yet.-Dr. Glynn Octave    ALLERGIES:  is allergic to penicillins; avapro [irbesartan]; and lipitor [atorvastatin calcium].  MEDICATIONS:  Current Outpatient Prescriptions  Medication Sig Dispense Refill  . acetaminophen (TYLENOL) 500 MG tablet Take 1,000 mg by mouth every 6 (six) hours as needed for headache. Reported on 10/08/2015    . albuterol (PROAIR HFA) 108 (90 Base) MCG/ACT inhaler Inhale 2 puffs into the lungs every 6 (six) hours as needed for wheezing or shortness of breath. 8.5 g 5  . ALPRAZolam (XANAX) 0.25 MG tablet Take 1 tablet (0.25 mg total) by mouth at bedtime. 30 tablet 5  . Blood Glucose Calibration (TAI DOC CONTROL) NORMAL SOLN Use as directed.    . Blood Glucose Monitoring Suppl (CLEVER CHEK AUTO-CODE VOICE) DEVI USE AS DIRECTED 1 each 2  . Cetirizine HCl 10 MG CAPS Take 1 capsule (10 mg total) by mouth at bedtime as needed. For itching    . Cholecalciferol (VITAMIN D3) 5000 UNITS TABS Take 1 tablet by mouth every morning.    . furosemide (LASIX) 40 MG tablet Take 1 tablet (40 mg total) by mouth daily. 30 tablet 6  . guaiFENesin (MUCINEX) 600 MG 12 hr tablet Take 600 mg by mouth 2 (two) times daily as needed for cough or to loosen phlegm.    . insulin NPH Human (NOVOLIN N) 100 UNIT/ML injection Inject 0.4 mLs (40 Units total) into the skin daily  before breakfast. 20 mL 1  . insulin regular (NOVOLIN R,HUMULIN R) 250 units/2.74m (100 units/mL) injection 6 units prior to each meal 10 mL 1  . ipratropium-albuterol (DUONEB) 0.5-2.5 (3) MG/3ML SOLN PLACE 1 VIAL (3 MLS) IN THE NEBULIZER EVERY 6 HOURS AS NEEDED 1080 mL 0  . [employer] (LANCING DEVICE) MISC USE AS DIRECTED 1 each 2  . metFORMIN (GLUCOPHAGE) 500 MG tablet TAKE ONE TABLET BY MOUTH IN THE MORNING AND TAKE TWO TABLETS IN THE EVENING 270 tablet 0  . metoprolol (LOPRESSOR) 100 MG tablet Take 1 tablet (100 mg total) by mouth 2 (two) times daily. 180 tablet 3  .  simvastatin (ZOCOR) 40 MG tablet TAKE ONE TABLET BY MOUTH ONCE DAILY 90 tablet 0  . triamcinolone cream (KENALOG) 0.1 % Apply to arms and legs bid for itching / rash 80 g 0  . umeclidinium-vilanterol (ANORO ELLIPTA) 62.5-25 MCG/INH AEPB Inhale 1 puff into the lungs daily. 2 each 0  . warfarin (COUMADIN) 5 MG tablet TAKE ONE & ONE-HALF TABLETS BY MOUTH ONCE DAILY EVERY MONDAY AND EVERY FRIDAY THEN TAKE ONE  TABLET THE REST OF THE WEEK 140 tablet 2   No current facility-administered medications for this visit.     SURGICAL HISTORY:  Past Surgical History:  Procedure Laterality Date  . APPENDECTOMY    . COLONOSCOPY WITH PROPOFOL N/A 05/05/2014   Procedure: COLONOSCOPY WITH PROPOFOL;  Surgeon: RInda Castle MD;  Location: [facility];  Service: Endoscopy;  Laterality: N/A;    REVIEW OF SYSTEMS:  A comprehensive review of systems was negative except for: Constitutional: positive for fatigue Respiratory: positive for cough Musculoskeletal: positive for back pain   PHYSICAL EXAMINATION: General appearance: alert, cooperative, fatigued and no distress Head: Normocephalic, without obvious abnormality, atraumatic Neck: no adenopathy, no JVD, supple, symmetrical, trachea midline and thyroid not enlarged, symmetric, no tenderness/mass/nodules Lymph nodes: Cervical, supraclavicular, and axillary nodes normal. Resp: clear to auscultation bilaterally Back: symmetric, no curvature. ROM normal. No CVA tenderness. Cardio: regular rate and rhythm, S1, S2 normal, no murmur, click, rub or gallop GI: soft, non-tender; bowel sounds normal; no masses,  no organomegaly Extremities: extremities normal, atraumatic, no cyanosis or edema  ECOG PERFORMANCE STATUS: 1 - Symptomatic but completely ambulatory  Blood pressure 101/64, pulse (!) 101, temperature 97.4 F (36.3 C), temperature source Oral, resp. rate 18, height _0  (1.803 m), weight 288 lb (130.6 kg), SpO2 93 %.  LABORATORY DATA: Lab Results    Component Value Date   WBC 9.5 05/19/2016   HGB 15.2 05/19/2016   HCT 44.9 05/19/2016   MCV 87.2 05/19/2016   PLT 250 05/19/2016      Chemistry      Component Value Date/Time   NA 135 05/09/2016 1210   NA 139 10/08/2015 1046   K 4.2 05/09/2016 1210   K 4.4 10/08/2015 1046   CL 90 (L) 05/09/2016 1210   CL 100 09/03/2012 0751   CO2 30 (H) 05/09/2016 1210   CO2 31 (H) 10/08/2015 1046   BUN 26 05/09/2016 1210   BUN 22.9 10/08/2015 1046   CREATININE 1.11 05/09/2016 1210   CREATININE 1.0 10/08/2015 1046   GLU 185 03/29/2012      Component Value Date/Time   CALCIUM 9.0 05/09/2016 1210   CALCIUM 9.7 10/08/2015 1046   ALKPHOS 85 04/15/2016 1055   ALKPHOS 64 10/08/2015 1046   AST 26 04/15/2016 1055   AST 15 10/08/2015 1046   ALT 36 04/15/2016 1055   ALT 14  10/08/2015 1046   BILITOT 1.5 (H) 04/15/2016 1055   BILITOT 1.19 10/08/2015 1046       RADIOGRAPHIC STUDIES: Dg Chest 2 View  Result Date: 05/09/2016 CLINICAL DATA:  Shortness of breath. EXAM: CHEST  2 VIEW COMPARISON:  03/31/2016 FINDINGS: Lungs are adequately inflated with subtle blunting of the right costophrenic angle which may be due to pleuroparenchymal scarring versus small amount of right pleural fluid. No airspace consolidation or pneumothorax. Cardiomediastinal silhouette is within normal. There is minimal calcified plaque over the aortic arch. There are mild degenerate changes of the spine. IMPRESSION: No acute cardiopulmonary disease. Right pleuroparenchymal scarring versus tiny amount of pleural fluid. Electronically Signed   By: Marin Olp M.D.   On: 05/09/2016 14:03   Mr Brain Wo Contrast  Result Date: 05/05/2016 [facility] NEUROIMAGING REPORT STUDY DATE: 05/02/16 PATIENT NAME: TEMPLE SPORER DOB: 07-07-1950 MRN: [id number] ORDERING CLINICIAN: Marcial Pacas, MD PhD CLINICAL HISTORY: 66 year old male with memory loss and passing out. EXAM: MRI brain (without) TECHNIQUE: MRI of the brain without  contrast was obtained utilizing 5 mm axial slices with T1, T2, T2 flair, T2 star gradient echo and diffusion weighted views.  T1 sagittal and T2 coronal views were obtained. CONTRAST: no IMAGING SITE: [facility] (1.2 Tesla MRI) FINDINGS: No abnormal lesions are seen on diffusion-weighted views to suggest acute ischemia. The cortical sulci, fissures and cisterns are notable for mild perisylvian atrophy. Lateral, third and fourth ventricle are normal in size and appearance. No extra-axial fluid collections are seen. No evidence of mass effect or midline shift.  Scattered periventricular and subcortical chronic small vessel ischemic disease. On sagittal views the posterior fossa, pituitary gland and corpus callosum are unremarkable. No evidence of intracranial hemorrhage on gradient-echo views. The orbits and their contents, paranasal sinuses and calvarium are unremarkable.  Intracranial flow voids are present.   Mildly abnormal MRI brain (without) demonstrating: 1. Scattered periventricular and subcortical chronic small vessel ischemic disease. 2. No acute findings. INTERPRETING PHYSICIAN: Penni Bombard, MD Certified in Neurology, Neurophysiology and Neuroimaging [facility] [address], [place], [place] [address] [phone]    ASSESSMENT AND PLAN:   this is a very pleasant 66 years old white male with history of high-grade B-cell non-Hodgkin lymphoma , suspicious for Burkitt's lymphoma but was negative cytogenetics for 8;14 abnormality.  The patient is status post 6 cycles of systemic chemotherapy with choppy /Rituxan completed in 2012.  He has been observation since that time and no evidence for disease recurrence.  CBC and comprehensive metabolic panel performed earlier today were unremarkable for any concerning findings for disease recurrence.  I discussed the lab result with the patient and his wife.  I recommended for him to continue on observation  with repeat CBC, comprehensive metabolic panel and LDH in one year.  For the memory loss, he is currently undergoing evaluation by neurology.  The patient was advised to call immediately if he has any concerning symptoms in the interval. The patient voices understanding of current disease status and treatment options and is in agreement with the current care plan.  All questions were answered. The patient knows to call the clinic with any problems, questions or concerns. We can certainly see the patient much sooner if necessary. I spent 10 minutes counseling the patient face to face. The total time spent in the appointment was 15 minutes.   Disclaimer: This note was dictated with voice recognition software. Similar sounding words can  inadvertently be transcribed and may not be corrected upon review.       

## 2016-05-19 NOTE — Patient Outreach (Signed)
[facility]) Care Management  05/19/2016  ERVING HON 1950-09-07 [id number]  Second telephone call to patient regarding transition of care follow up. Unable to reach patient. HIPAA compliant voice message left with call back phone number.   PLAN:  RNCM will attempt 3rd telephone call to patient within  1 week.   [name] RN,BSN,CCM [facility] Telephonic  [phone]

## 2016-05-20 ENCOUNTER — Ambulatory Visit: Payer: Self-pay

## 2016-05-20 LAB — KAPPA/LAMBDA LIGHT CHAINS
IG KAPPA FREE LIGHT CHAIN: 50.7 mg/L — AB (ref 3.3–19.4)
Ig Lambda Free Light Chain: 41.1 mg/L — ABNORMAL HIGH (ref 5.7–26.3)
KAPPA/LAMBDA FLC RATIO: 1.23 (ref 0.26–1.65)

## 2016-05-20 LAB — IGG, IGA, IGM
IGA/IMMUNOGLOBULIN A, SERUM: 268 mg/dL (ref 61–437)
IGM (IMMUNOGLOBIN M), SRM: 143 mg/dL (ref 20–172)
IgG, Qn, Serum: 1149 mg/dL (ref 700–1600)

## 2016-05-20 LAB — BETA 2 MICROGLOBULIN, SERUM: BETA 2: 3.1 mg/L — AB (ref 0.6–2.4)

## 2016-05-23 ENCOUNTER — Ambulatory Visit (INDEPENDENT_AMBULATORY_CARE_PROVIDER_SITE_OTHER): Payer: Medicare HMO | Admitting: Neurology

## 2016-05-23 ENCOUNTER — Telehealth: Payer: Self-pay | Admitting: *Deleted

## 2016-05-23 DIAGNOSIS — I4891 Unspecified atrial fibrillation: Secondary | ICD-10-CM

## 2016-05-23 DIAGNOSIS — IMO0002 Reserved for concepts with insufficient information to code with codable children: Secondary | ICD-10-CM

## 2016-05-23 DIAGNOSIS — R55 Syncope and collapse: Secondary | ICD-10-CM | POA: Diagnosis not present

## 2016-05-23 DIAGNOSIS — G4733 Obstructive sleep apnea (adult) (pediatric): Secondary | ICD-10-CM

## 2016-05-23 DIAGNOSIS — C8332 Diffuse large B-cell lymphoma, intrathoracic lymph nodes: Secondary | ICD-10-CM

## 2016-05-23 DIAGNOSIS — J9621 Acute and chronic respiratory failure with hypoxia: Secondary | ICD-10-CM

## 2016-05-23 NOTE — Telephone Encounter (Signed)
Spoke to patient's wife (on HIPAA) and informed her of results.

## 2016-05-23 NOTE — Telephone Encounter (Signed)
-----   Message from Kathrynn Ducking, MD sent at 05/23/2016  2:12 PM EST ----- Please call the patient with the results of the EEG (normal). Thanks.

## 2016-05-23 NOTE — Procedures (Signed)
    History:  Justin Ruiz is a 66 year old patient with a history of a memory disturbance. On 03/28/2016, the patient was noted to have a glucose of 25, he was found unconscious, foaming from the mouth. The patient has had significant memory loss since he was discharged from the hospital in December 2017. He is being evaluated for this issue.  This is a routine. No skull defects are noted medications include albuterol inhaler, Xanax, vitamin D supplementation, Lasix, insulin, DuoNeb inhaler, metformin, Lopressor, Zocor, and Coumadin.   EEG classification: Normal awake  Description of the recording: The background rhythms of this recording consists of a fairly well modulated medium amplitude alpha rhythm of 9 Hz that is reactive to eye opening and closure. As the record progresses, the patient appears to remain in the waking state throughout the recording. Photic stimulation was performed, resulting in a bilateral and symmetric photic driving response. Hyperventilation was not performed. At no time during the recording does there appear to be evidence of spike or spike wave discharges or evidence of focal slowing. EKG monitor shows no evidence of cardiac rhythm abnormalities with a heart rate of 66.  Impression: This is a normal EEG recording in the waking state. No eviden. ce of ictal or interictal discharges are seen.

## 2016-05-25 ENCOUNTER — Other Ambulatory Visit: Payer: Self-pay

## 2016-05-25 NOTE — Patient Outreach (Signed)
[facility]) Care Management  05/25/2016  [name] [birth year] [id number]   SUBJECTIVE; Telephone call to patient regarding transition of care.  Unable to reach patient.  HIPAA compliant voice message left with call back phone number.   PLAN; RNCM will send patient outreach letter to attempt contact.  [name] RN,BSN,CCM [facility] Telephonic  [phone]

## 2016-05-26 DIAGNOSIS — J449 Chronic obstructive pulmonary disease, unspecified: Secondary | ICD-10-CM | POA: Diagnosis not present

## 2016-05-26 DIAGNOSIS — M1990 Unspecified osteoarthritis, unspecified site: Secondary | ICD-10-CM | POA: Diagnosis not present

## 2016-05-26 DIAGNOSIS — J441 Chronic obstructive pulmonary disease with (acute) exacerbation: Secondary | ICD-10-CM | POA: Diagnosis not present

## 2016-05-26 DIAGNOSIS — I5031 Acute diastolic (congestive) heart failure: Secondary | ICD-10-CM | POA: Diagnosis not present

## 2016-05-26 DIAGNOSIS — G4733 Obstructive sleep apnea (adult) (pediatric): Secondary | ICD-10-CM | POA: Diagnosis not present

## 2016-05-26 DIAGNOSIS — C8512 Unspecified B-cell lymphoma, intrathoracic lymph nodes: Secondary | ICD-10-CM | POA: Diagnosis not present

## 2016-05-26 DIAGNOSIS — I110 Hypertensive heart disease with heart failure: Secondary | ICD-10-CM | POA: Diagnosis not present

## 2016-05-26 DIAGNOSIS — I5040 Unspecified combined systolic (congestive) and diastolic (congestive) heart failure: Secondary | ICD-10-CM | POA: Diagnosis not present

## 2016-05-26 DIAGNOSIS — E1165 Type 2 diabetes mellitus with hyperglycemia: Secondary | ICD-10-CM | POA: Diagnosis not present

## 2016-05-26 DIAGNOSIS — I4891 Unspecified atrial fibrillation: Secondary | ICD-10-CM | POA: Diagnosis not present

## 2016-05-30 ENCOUNTER — Ambulatory Visit: Payer: Self-pay | Admitting: Pharmacist

## 2016-05-30 ENCOUNTER — Ambulatory Visit: Payer: Medicare HMO | Admitting: Pharmacist

## 2016-06-06 ENCOUNTER — Ambulatory Visit (INDEPENDENT_AMBULATORY_CARE_PROVIDER_SITE_OTHER): Payer: Medicare HMO | Admitting: Pharmacist

## 2016-06-06 ENCOUNTER — Encounter: Payer: Self-pay | Admitting: Pharmacist

## 2016-06-06 VITALS — BP 110/70 | Ht 71.0 in | Wt 294.0 lb

## 2016-06-06 DIAGNOSIS — Z794 Long term (current) use of insulin: Secondary | ICD-10-CM | POA: Diagnosis not present

## 2016-06-06 DIAGNOSIS — I482 Chronic atrial fibrillation, unspecified: Secondary | ICD-10-CM

## 2016-06-06 DIAGNOSIS — Z7901 Long term (current) use of anticoagulants: Secondary | ICD-10-CM | POA: Diagnosis not present

## 2016-06-06 DIAGNOSIS — E119 Type 2 diabetes mellitus without complications: Secondary | ICD-10-CM

## 2016-06-06 LAB — COAGUCHEK XS/INR WAIVED
INR: 2.4 — ABNORMAL HIGH (ref 0.9–1.1)
Prothrombin Time: 28.8 s

## 2016-06-06 NOTE — Progress Notes (Signed)
Patient ID: Justin Ruiz, male   DOB: 1951/02/20, 66 y.o.   MRN: [id number]    Subjective:    Justin Ruiz is a 66 y.o. male who presents for a follow up evaluation of Type 2 diabetes mellitus requiring insulin therapy and to have INR rechecked.    Justin Ruiz was hospitalized 03/28/2016 due to severe hypoglycemia.  Shortly after we switched to [place] for about 2 weeks but BG was increase and patient and wife felt that it was not working better than NPH compared to it's cost. Justin Ruiz has been having work up for memory changes by neurologist. Justin Ruiz reports improved focus and memory over the last 2 weeks.  Mrs. Boxley reports that BG has been improving over last 2-3 weeks.  He did not bring in glucometer but did bring in written records for a few days.  He is checking BG tid.   HBG readings = 188, 181, 164, 132, 111, 138, 202, 227, 129, 191, 134, 125, 151, 145, 168, 223, 139, 177, 116, 204, 245, 275  Patient reports having 0 hypoglycemic events and 0 BG over 300.  Has had 6 BG readings over 200  Current regimen is : metformin 500mg  1 tablet qam and 2 tablets qpm. NPH insulin 45 units qam and Regular insulin as needed for BG over 200 (take 3 units if 200 to 250; 5 units if 251 to 300 and 8 units if 301 or above)  Known diabetic complications: peripheral neuropathy, cardiovascular disease and cerebrovascular disease Cardiovascular risk factors: advanced age (older than 45 for men, 87 for women), diabetes mellitus, dyslipidemia, hypertension, male gender, obesity (BMI >= 30 kg/m2) and sedentary lifestyle  Eye exam current (within one year): yes Weight trend: has increased 6 lbs over the last 2 weeks Prior visit with CDE: yes  Current diet: in general, a "healthy" diet   - patient with help of wife is following CHO counting diet most days Current exercise: none Medication Compliance?  Yes   Is He on ACE inhibitor or angiotensin II receptor blocker?  No  Lisinopril was stopped in hospital  03/2016.   Objective:    BP 110/70   Ht 5\' 11"  (1.803 m)   Wt 294 lb (133.4 kg)   BMI 41.00 kg/m    INR was 2.4  today A1c = 5.9 % (03/2016)  Lab Review Glucose  Date Value  05/19/2016 128 mg/dl  05/09/2016 349 mg/dL (H)  04/08/2016 295 mg/dL (H)  10/08/2015 206 mg/dl (H)  04/09/2015 195 mg/dl (H)  09/03/2012 202 mg/dl (H)  02/24/2012 208 mg/dl (H)   Glucose, Bld (mg/dL)  Date Value  04/03/2016 144 (H)  04/02/2016 96  04/01/2016 136 (H)  11/01/2011 193 (H)  05/11/2011 220 (H)  12/28/2010 161 (H)   Chloride (mEq/L)  Date Value  05/19/2016 94 (L)  10/08/2015 100  04/09/2015 102   CO2  Date Value  05/19/2016 32 mEq/L (H)  05/09/2016 30 mmol/L (H)  04/08/2016 27 mmol/L  04/03/2016 34 mmol/L (H)  10/08/2015 31 mEq/L (H)  04/09/2015 29 mEq/L   BUN (mg/dL)  Date Value  05/19/2016 25.3  05/09/2016 26  04/08/2016 40 (H)  04/03/2016 31 (H)  04/02/2016 33 (H)  04/01/2016 39 (H)  02/17/2016 19  10/08/2015 22.9  04/09/2015 24.9   Creatinine (mg/dL)  Date Value  05/19/2016 1.2  10/08/2015 1.0  04/09/2015 1.0   Creatinine, Ser (mg/dL)  Date Value  05/09/2016 1.11  04/08/2016 1.23  04/03/2016 0.85  Assessment:    Diabetes Mellitus type II, under improving control. BG is variable but patient has history of hypoglycemia requiring medication attention  Obesity - weight has increased recently therapeutic anticoagulation   Plan:    1.  Rx changes:   Continue NPH insulin 45 units qam  Change Regular insulin to sliding scale    If blood glucose is 200 to 250 - give 3 units                         If blood glucose is 251 to 300 - give 5 units                         If blood glucose if 301 or above - give 8 units.  2.  Anticoagulation Dose Instructions as of 06/06/2016      Dorene Grebe Tue Wed Thu Fri Sat   New Dose 5 mg 7.5 mg 5 mg 5 mg 5 mg 5 mg 5 mg    Description   Continue current warfarin dose of 1 tablet daily except take 1 and 1/2 tablets on  Mondays only.     3.  Continue CHO counting diet. 4.  Continue to check BG 3  times a day  Adjusted goal BG due to history of hypoglycemia  FBG goal 100 to 150  Post prandial BG goal is less than 200 5.  Recommended increase physical activity - start with 5-10 minutes at a time and increase as able. 6. Follow up: 3 weeks

## 2016-06-08 ENCOUNTER — Other Ambulatory Visit: Payer: Self-pay

## 2016-06-08 NOTE — Patient Outreach (Signed)
[facility]) Care Management  06/08/2016  JAHMAURI QUIROS [birth year] [id number]  No response from patient after telephone calls and outreach letter attempt.  PLAN; RNCM will refer patient to case management assistant to close due to inability to re-establish contact with patient.  RNCM will notify patients primary MD of closure. RNCM will send patient closure letter.   [name] RN,BSN,CCM [facility] Telephonic  [phone]

## 2016-06-11 DIAGNOSIS — J9611 Chronic respiratory failure with hypoxia: Secondary | ICD-10-CM | POA: Diagnosis not present

## 2016-06-11 DIAGNOSIS — J439 Emphysema, unspecified: Secondary | ICD-10-CM | POA: Diagnosis not present

## 2016-06-11 DIAGNOSIS — J449 Chronic obstructive pulmonary disease, unspecified: Secondary | ICD-10-CM | POA: Diagnosis not present

## 2016-06-13 DIAGNOSIS — G4733 Obstructive sleep apnea (adult) (pediatric): Secondary | ICD-10-CM | POA: Diagnosis not present

## 2016-06-20 ENCOUNTER — Ambulatory Visit: Payer: Self-pay | Admitting: Neurology

## 2016-06-23 DIAGNOSIS — J441 Chronic obstructive pulmonary disease with (acute) exacerbation: Secondary | ICD-10-CM | POA: Diagnosis not present

## 2016-06-23 DIAGNOSIS — I5040 Unspecified combined systolic (congestive) and diastolic (congestive) heart failure: Secondary | ICD-10-CM | POA: Diagnosis not present

## 2016-07-04 ENCOUNTER — Ambulatory Visit: Payer: Commercial Managed Care - HMO | Admitting: Neurology

## 2016-07-04 ENCOUNTER — Ambulatory Visit: Payer: Commercial Managed Care - HMO | Admitting: Internal Medicine

## 2016-07-05 ENCOUNTER — Ambulatory Visit (INDEPENDENT_AMBULATORY_CARE_PROVIDER_SITE_OTHER): Payer: Medicare HMO | Admitting: Pharmacist

## 2016-07-05 ENCOUNTER — Encounter: Payer: Self-pay | Admitting: Pharmacist

## 2016-07-05 VITALS — BP 110/60 | HR 64 | Ht 71.0 in | Wt 298.0 lb

## 2016-07-05 DIAGNOSIS — E119 Type 2 diabetes mellitus without complications: Secondary | ICD-10-CM | POA: Diagnosis not present

## 2016-07-05 DIAGNOSIS — Z794 Long term (current) use of insulin: Secondary | ICD-10-CM

## 2016-07-05 DIAGNOSIS — I482 Chronic atrial fibrillation: Secondary | ICD-10-CM | POA: Diagnosis not present

## 2016-07-05 DIAGNOSIS — Z7901 Long term (current) use of anticoagulants: Secondary | ICD-10-CM | POA: Diagnosis not present

## 2016-07-05 DIAGNOSIS — I4821 Permanent atrial fibrillation: Secondary | ICD-10-CM

## 2016-07-05 LAB — COAGUCHEK XS/INR WAIVED
INR: 3 — ABNORMAL HIGH (ref 0.9–1.1)
Prothrombin Time: 35.7 s

## 2016-07-05 LAB — BAYER DCA HB A1C WAIVED: HB A1C (BAYER DCA - WAIVED): 8.2 % — ABNORMAL HIGH (ref <7.0)

## 2016-07-05 MED ORDER — INSULIN NPH (HUMAN) (ISOPHANE) 100 UNIT/ML ~~LOC~~ SUSP: 50.0000 [IU] | Freq: Every day | SUBCUTANEOUS | 1 refills | Status: DC

## 2016-07-05 NOTE — Patient Instructions (Signed)
Goal Blood glucose:    Fasting (before meals) = 100 to 150   Within 2 hours of eating = less than 200  Try to have no more than 3 of these foods per meal - and keep serving sizes to 1/2 cup  Fruit:   1/2 cup or once piece (baseball size)- apples, pears, pineapple, peaches, oranges  1 cup - berries, melons  1/2 banana or grapefruit  Stachy Vegetables:   1/2 cup potatoes (white or sweet), corn, peas  Other starches:   1 piece of bread  1/2 cup rice or pasta  4 inch pancake    These foods you can eat more freely: Proteins:   Fish  Chicken or [place]  Beef or pork (1 or 2 servings per week)  Eggs  Nuts (peanuts, walnuts, almonds, pistachios)  Cheese  Non starchy vegetables:  Green beans  Broccoli or cauliflower  Lettuce, greens, cabbage  Brussel Sprout  Carrots  Onions and peppers  Celery  Tomatoes  Asparagus  Eggplant  Cucumbers  Squash and Zucchini

## 2016-07-05 NOTE — Progress Notes (Signed)
Patient ID: Justin Ruiz, male   DOB: February 07, 1951, 66 y.o.   MRN: [id number]    Subjective:    Justin Ruiz is a 66 y.o. male who presents for a follow up evaluation of Type 2 diabetes mellitus requiring insulin therapy and to have INR rechecked.   Current warfarin dose to warfarin 5mg  - take 1 tablet daily except on Mondays he takes 1 and 1/2 tablet (=7.5 mg).  Patient denies s/s of bleeding.  No recent med changes.     Justin Ruiz has a history of severe hypoglycemia requiring hospitalization about 3 months ago. Shortly after event he was switched to [place] for about 2 weeks but BG remained elevated and patient and wife felt that it was not working better than NPH compared to it's cost.  Justin Ruiz reports that BG readings at home have been stable over last 4 weeks.  He did not bring in glucometer.  He is checking BG bid to tid.   Patient reported HBG readings = 174, 176, 200, 215, 180. Lowest BG in the 170's Highest BG = 215  Patient reports having 0 hypoglycemic events and 0 BG over 300.  Current regimen is : metformin 500mg  1 tablet qam and 2 tablets qpm. NPH insulin 45 units qam and Regular insulin as needed for BG over 200 (take 3 units if 200 to 250; 5 units if 251 to 300 and 8 units if 301 or above).  Patient reports that he uses supplemental Regular insulin about 3 to 4 times per month.  Known diabetic complications: peripheral neuropathy, cardiovascular disease and cerebrovascular disease Cardiovascular risk factors: advanced age (older than 38 for men, 34 for women), diabetes mellitus, dyslipidemia, hypertension, male gender, obesity (BMI >= 30 kg/m2) and sedentary lifestyle  Eye exam current (within one year): yes Weight trend: has increased 4 lbs over the last 4 weeks Prior visit with CDE: yes  Current diet: patient reports he is trying to limit sweets and sugar but not paying much attention to other sources of carbs such as bread or potatoes / other starchy vegetables.  He  only drinks water. He also is cautious about green [place] vegetables and vitamin K intake due to warfarin.  Current exercise: none Medication Compliance?  Yes   Is He on ACE inhibitor or angiotensin II receptor blocker?  No  Lisinopril was stopped in hospital 03/2016.   Objective:    BP 110/60   Pulse 64   Ht 5\' 11"  (1.803 m)   Wt 298 lb (135.2 kg)   BMI 41.56 kg/m    INR was 3.0 today  A1c = 8.2% today in office A1c = 5.9 % (03/2016)  Lab Review Glucose  Date Value  05/19/2016 128 mg/dl  05/09/2016 349 mg/dL (H)  04/08/2016 295 mg/dL (H)  10/08/2015 206 mg/dl (H)  04/09/2015 195 mg/dl (H)  09/03/2012 202 mg/dl (H)  02/24/2012 208 mg/dl (H)   Glucose, Bld (mg/dL)  Date Value  04/03/2016 144 (H)  04/02/2016 96  04/01/2016 136 (H)  11/01/2011 193 (H)  05/11/2011 220 (H)  12/28/2010 161 (H)   Chloride (mEq/L)  Date Value  05/19/2016 94 (L)  10/08/2015 100  04/09/2015 102   CO2  Date Value  05/19/2016 32 mEq/L (H)  05/09/2016 30 mmol/L (H)  04/08/2016 27 mmol/L  04/03/2016 34 mmol/L (H)  10/08/2015 31 mEq/L (H)  04/09/2015 29 mEq/L   BUN (mg/dL)  Date Value  05/19/2016 25.3  05/09/2016 26  04/08/2016 40 (H)  04/03/2016 31 (H)  04/02/2016 33 (H)  04/01/2016 39 (H)  02/17/2016 19  10/08/2015 22.9  04/09/2015 24.9   Creatinine (mg/dL)  Date Value  05/19/2016 1.2  10/08/2015 1.0  04/09/2015 1.0   Creatinine, Ser (mg/dL)  Date Value  05/09/2016 1.11  04/08/2016 1.23  04/03/2016 0.85     Assessment:    Diabetes Mellitus type II - requiring insulin.  A1c has increased but patient has history of hypoglycemia requiring medication attention  Obesity - weight has increased recently Therapeutic anticoagulation   Plan:    1.  Rx changes:   Increase  NPH insulin to 50 units qam  Continue Regular insulin as needed per sliding scale    If blood glucose is 200 to 250 - give 3 units                         If blood glucose is 251 to 300 - give 5  units                         If blood glucose if 301 or above - give 8 units.  2.  Anticoagulation Dose Instructions as of 07/05/2016      Dorene Grebe Tue Wed Thu Fri Sat   New Dose 5 mg 7.5 mg 5 mg 5 mg 5 mg 5 mg 5 mg    Description   Continue current warfarin dose of 1 tablet daily except take 1 and 1/2 tablets on Mondays only.  INR was 3.0 today     3.  Reviewed food high in CHO and serving size recommendation.  Also reviewed foods that do not affect BG as much.  Patient to increase lean proteins, whole grains and non starchy vegetables - OK to have 1/2 cup serving of high vitamin K food 2 times per week. 4.  Continue to check BG 3  times a day  Reminded pt of his adjusted goal BG due to history of hypoglycemia  FBG goal 100 to 150  Post prandial BG goal is less than 200 5.  Recommended increase physical activity - start with 5-10 minutes at a time and increase as able. Patient is to discuss [place] with cardiologist at next visit. 6. Follow up: 1 week with PCP and 6 weeks with me.

## 2016-07-09 DIAGNOSIS — J9611 Chronic respiratory failure with hypoxia: Secondary | ICD-10-CM | POA: Diagnosis not present

## 2016-07-09 DIAGNOSIS — J449 Chronic obstructive pulmonary disease, unspecified: Secondary | ICD-10-CM | POA: Diagnosis not present

## 2016-07-09 DIAGNOSIS — J439 Emphysema, unspecified: Secondary | ICD-10-CM | POA: Diagnosis not present

## 2016-07-14 ENCOUNTER — Ambulatory Visit (INDEPENDENT_AMBULATORY_CARE_PROVIDER_SITE_OTHER): Payer: Medicare HMO | Admitting: Family Medicine

## 2016-07-14 ENCOUNTER — Encounter: Payer: Self-pay | Admitting: Family Medicine

## 2016-07-14 VITALS — BP 108/67 | HR 81 | Temp 96.9°F | Ht 71.0 in | Wt 296.0 lb

## 2016-07-14 DIAGNOSIS — I482 Chronic atrial fibrillation, unspecified: Secondary | ICD-10-CM

## 2016-07-14 DIAGNOSIS — E559 Vitamin D deficiency, unspecified: Secondary | ICD-10-CM

## 2016-07-14 DIAGNOSIS — I509 Heart failure, unspecified: Secondary | ICD-10-CM

## 2016-07-14 DIAGNOSIS — I10 Essential (primary) hypertension: Secondary | ICD-10-CM | POA: Diagnosis not present

## 2016-07-14 DIAGNOSIS — Z794 Long term (current) use of insulin: Secondary | ICD-10-CM | POA: Diagnosis not present

## 2016-07-14 DIAGNOSIS — C8332 Diffuse large B-cell lymphoma, intrathoracic lymph nodes: Secondary | ICD-10-CM

## 2016-07-14 DIAGNOSIS — R5383 Other fatigue: Secondary | ICD-10-CM

## 2016-07-14 DIAGNOSIS — R0602 Shortness of breath: Secondary | ICD-10-CM

## 2016-07-14 DIAGNOSIS — E119 Type 2 diabetes mellitus without complications: Secondary | ICD-10-CM | POA: Diagnosis not present

## 2016-07-14 DIAGNOSIS — R32 Unspecified urinary incontinence: Secondary | ICD-10-CM | POA: Diagnosis not present

## 2016-07-14 DIAGNOSIS — E7800 Pure hypercholesterolemia, unspecified: Secondary | ICD-10-CM

## 2016-07-14 DIAGNOSIS — R2681 Unsteadiness on feet: Secondary | ICD-10-CM

## 2016-07-14 DIAGNOSIS — C8238 Follicular lymphoma grade IIIa, lymph nodes of multiple sites: Secondary | ICD-10-CM

## 2016-07-14 DIAGNOSIS — I5032 Chronic diastolic (congestive) heart failure: Secondary | ICD-10-CM | POA: Diagnosis not present

## 2016-07-14 DIAGNOSIS — I4821 Permanent atrial fibrillation: Secondary | ICD-10-CM

## 2016-07-14 DIAGNOSIS — R531 Weakness: Secondary | ICD-10-CM

## 2016-07-14 DIAGNOSIS — R5381 Other malaise: Secondary | ICD-10-CM | POA: Diagnosis not present

## 2016-07-14 LAB — MICROSCOPIC EXAMINATION

## 2016-07-14 LAB — URINALYSIS, COMPLETE
Bilirubin, UA: NEGATIVE
KETONES UA: NEGATIVE
Nitrite, UA: NEGATIVE
SPEC GRAV UA: 1.015 (ref 1.005–1.030)
UUROB: 0.2 mg/dL (ref 0.2–1.0)
pH, UA: 6.5 (ref 5.0–7.5)

## 2016-07-14 NOTE — Patient Instructions (Signed)
Medicare Annual Wellness Visit  [facility] and the medical providers at [facility] strive to bring you the best medical care.  In doing so we not only want to address your current medical conditions and concerns but also to detect new conditions early and prevent illness, disease and health-related problems.    Medicare offers a yearly Wellness Visit which allows our clinical staff to assess your need for preventative services including immunizations, lifestyle education, counseling to decrease risk of preventable diseases and screening for fall risk and other medical concerns.    This visit is provided free of charge (no copay) for all Medicare recipients. The clinical pharmacists at [facility] have begun to conduct these Wellness Visits which will also include a thorough review of all your medications.    As you primary medical provider recommend that you make an appointment for your Annual Wellness Visit if you have not done so already this year.  You may set up this appointment before you leave today or you may call back ([phone]) and schedule an appointment.  Please make sure when you call that you mention that you are scheduling your Annual Wellness Visit with the clinical pharmacist so that the appointment may be made for the proper length of time.     Continue current medications. Continue good therapeutic lifestyle changes which include good diet and exercise. Fall precautions discussed with patient. If an FOBT was given today- please return it to our front desk. If you are over 50 years old - you may need Prevnar 13 or the adult Pneumonia vaccine.  **Flu shots are available--- please call and schedule a FLU-CLINIC appointment**  After your visit with us today you will receive a survey in the mail or online from [employer] regarding your care with us. Please take a moment to fill this out. Your feedback is very  important to us as you can help us better understand your patient needs as well as improve your experience and satisfaction. WE CARE ABOUT YOU!!!    

## 2016-07-14 NOTE — Progress Notes (Signed)
Subjective:    Patient ID: Justin Ruiz, male    DOB: 1950/04/26, 66 y.o.   MRN: [id number]  HPI Pt here for follow up and management of chronic medical problems which includes a fib, diabetes, hyperlipidemia and hypertension. He is taking medication regularly.The patient comes to the visit today with his wife. He is complaining of decreased ability to sleep with increasing fatigue and weakness. He also has urinary incontinence and periodic agitation. He is due to return an FOBT. He will get regular lab work today. His last hemoglobin A1c was 8.2% earlier this month. His O2 is 94% on 3 L. His blood pressure and vital signs are stable. Patient had an episode of severe hypoglycemia in early December and since that time he has not been the same. He is seen the cardiologist and the neurologist has had basically negative workups from their standpoint of view. He is had an EEG and has had an MRI of the brain which basically showed scattered. Ventricular and subcortical chronic small vessel disease. No acute findings. This was done back in early January. The most recent A1c was 8.2% and he is following closely with the clinical pharmacists on this. Patient denies any chest pain. He has shortness of breath if he is not using his O2. He denies any trouble swallowing heartburn indigestion nausea vomiting diarrhea or blood in the stool. He's passing his water without problems. His memory is better but he still has memory issues. He complains of having no energy. His wife works during the day and she says that he does not get a lot of physical activity during the day. He was getting some physical therapy but refused to do this. After a long talk and discussion with his wife and the patient he is agreed to restart physical therapy because he needs this and needs to get stronger set of getting weaker. He denies any trouble with passing his water other than some incontinence. If he is not wearing his oxygen regularly he  gets very short of breath. X-rays scans and EEGs and cardiac visits were reviewed prior to going in the room with the patient. He does seem to be more alert and more responsive to questions asked of him today. He has been followed by the clinical pharmacist to help get better control of his blood sugar and his last A1c was 8.2% on March 13.   Patient Active Problem List   Diagnosis Date Noted  . Memory loss 04/13/2016  . Hypomagnesemia   . Morbid obesity ([place]) 03/29/2016  . Hypoglycemia 03/28/2016  . Acute encephalopathy 03/28/2016  . Acute diastolic heart failure ([place]) 03/28/2016  . COPD (chronic obstructive pulmonary disease) ([place]) 03/28/2016  . Atrial fibrillation with RVR ([place]) 03/28/2016  . Acute on chronic respiratory failure ([place]) 03/28/2016  . 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 Chronic diastolic heart failure ([place])   . CAP (community acquired pneumonia)   . Acute-on-chronic respiratory failure ([place]) 06/07/2015  . Sepsis ([place]) 06/07/2015  . Diabetic neuropathy, type II diabetes mellitus ([place]) 09/25/2014  . Chronic respiratory failure with hypoxia ([place]) 05/25/2014  . Obstructive sleep apnea 05/25/2014  . Benign neoplasm of ascending colon 05/05/2014  . Excessive daytime sleepiness 01/24/2014  . Mediastinal adenopathy 06/20/2013  . Severe obesity (BMI >= 40) ([place]) 05/01/2013  . Generalized anxiety disorder 05/01/2013  . Lung nodules 03/11/2013  . Chronic anticoagulation 08/20/2012  . FIBRILLATION,  ATRIAL 05/12/2010  . NHL (non-Hodgkin's lymphoma) ([place]) 01/26/2010  . Obesity, morbid ([place]) 04/27/2009  . CARDIOVASCULAR STUDIES, ABNORMAL 04/27/2009  . ABNORMAL STRESS ELECTROCARDIOGRAM 03/25/2009  . PERSONAL HISTORY OF COLONIC POLYPS 03/02/2009  . Diabetes mellitus type 2, insulin dependent ([place]) 04/29/2007  . Hyperlipidemia 04/29/2007  . Hypertension 04/29/2007  .  Seasonal and perennial allergic rhinitis 04/29/2007  . COPD exacerbation ([place]) 04/29/2007  . G E R D 04/29/2007  . BENIGN PROSTATIC HYPERTROPHY, HX OF 04/29/2007   Outpatient Encounter Prescriptions as of 07/14/2016  Medication Sig  . acetaminophen (TYLENOL) 500 MG tablet Take 1,000 mg by mouth every 6 (six) hours as needed for headache. Reported on 10/08/2015  . albuterol (PROAIR HFA) 108 (90 Base) MCG/ACT inhaler Inhale 2 puffs into the lungs every 6 (six) hours as needed for wheezing or shortness of breath.  . ALPRAZolam (XANAX) 0.25 MG tablet Take 1 tablet (0.25 mg total) by mouth at bedtime.  . Blood Glucose Calibration (TAI DOC CONTROL) NORMAL SOLN Use as directed.  . Blood Glucose Monitoring Suppl (CLEVER CHEK AUTO-CODE VOICE) DEVI USE AS DIRECTED  . Cetirizine HCl 10 MG CAPS Take 1 capsule (10 mg total) by mouth at bedtime as needed. For itching  . Cholecalciferol (VITAMIN D3) 5000 UNITS TABS Take 1 tablet by mouth every morning.  . furosemide (LASIX) 40 MG tablet Take 1 tablet (40 mg total) by mouth daily.  Marland Kitchen guaiFENesin (MUCINEX) 600 MG 12 hr tablet Take 600 mg by mouth 2 (two) times daily as needed for cough or to loosen phlegm.  . insulin NPH Human (NOVOLIN N) 100 UNIT/ML injection Inject 0.5 mLs (50 Units total) into the skin daily before breakfast.  . insulin regular (NOVOLIN R,HUMULIN R) 250 units/2.9m (100 units/mL) injection 6 units prior to each meal  . ipratropium-albuterol (DUONEB) 0.5-2.5 (3) MG/3ML SOLN PLACE 1 VIAL (3 MLS) IN THE NEBULIZER EVERY 6 HOURS AS NEEDED  . Lancet Devices (LANCING DEVICE) MISC USE AS DIRECTED  . metFORMIN (GLUCOPHAGE) 500 MG tablet TAKE ONE TABLET BY MOUTH IN THE MORNING AND TAKE TWO TABLETS IN THE EVENING  . metoprolol (LOPRESSOR) 100 MG tablet Take 1 tablet (100 mg total) by mouth 2 (two) times daily.  . simvastatin (ZOCOR) 40 MG tablet TAKE ONE TABLET BY MOUTH ONCE DAILY  . triamcinolone cream (KENALOG) 0.1 % Apply to arms and legs bid for  itching / rash  . umeclidinium-vilanterol (ANORO ELLIPTA) 62.5-25 MCG/INH AEPB Inhale 1 puff into the lungs daily.  .Marland Kitchenwarfarin (COUMADIN) 5 MG tablet TAKE ONE & ONE-HALF TABLETS BY MOUTH ONCE DAILY EVERY MONDAY AND EVERY FRIDAY THEN TAKE ONE  TABLET THE REST OF THE WEEK   No facility-administered encounter medications on file as of 07/14/2016.      Review of Systems  Constitutional: Positive for fatigue.       Not sleeping well at night  - sleeps during the day   HENT: Negative.   Eyes: Negative.   Respiratory: Positive for shortness of breath.   Cardiovascular: Negative.   Gastrointestinal: Negative.   Endocrine: Negative.   Genitourinary: Negative.        Urine incont. At times  Musculoskeletal: Negative.   Skin: Negative.   Allergic/Immunologic: Negative.   Neurological: Positive for weakness.  Hematological: Negative.   Psychiatric/Behavioral: Positive for agitation.       Objective:   Physical Exam  Constitutional: He is oriented to person, place, and time. He appears well-developed and well-nourished.  A shunt is obese and frustrated  but seems to be more alert with these visit. He continues to be followed by his oncologist for his lymphoma.  HENT:  Head: Normocephalic and atraumatic.  Right Ear: External ear normal.  Left Ear: External ear normal.  Nose: Nose normal.  Mouth/Throat: Oropharynx is clear and moist. No oropharyngeal exudate.  Eyes: Conjunctivae and EOM are normal. Pupils are equal, round, and reactive to light. Right eye exhibits no discharge. Left eye exhibits no discharge. No scleral icterus.  Neck: Normal range of motion. Neck supple. No thyromegaly present.  Cardiovascular: Normal rate, regular rhythm, normal heart sounds and intact distal pulses.   No murmur heard. Rhythm was regular today at 84/m  Pulmonary/Chest: Effort normal. No respiratory distress. He has no wheezes. He has no rales. He exhibits no tenderness.  Diminished breath sounds  bilaterally with no rales or wheezing.  Abdominal: Soft. Bowel sounds are normal. He exhibits no mass. There is no tenderness. There is no rebound and no guarding.  Abdominal obesity without masses or tenderness.  Musculoskeletal: Normal range of motion. He exhibits no edema or tenderness.  The patient is weak and is on oxygen.  Lymphadenopathy:    He has no cervical adenopathy.  Neurological: He is alert and oriented to person, place, and time. No cranial nerve deficit.  Skin: Skin is warm and dry. No rash noted.  Psychiatric: He has a normal mood and affect. His behavior is normal. Judgment and thought content normal.  Nursing note and vitals reviewed.  Ht _0  (1.803 m)   Wt 296 lb (134.3 kg)   BMI 41.28 kg/m         Assessment & Plan:  1. Permanent atrial fibrillation (HCC) -The patient was in normal sinus rhythm today with a rate of 84/m. He will continue to follow-up with cardiology.  2. Diabetes mellitus type 2, insulin dependent ([place]) -His most recent A1c was over 8 and he will come back and see the clinical pharmacists as planned and we'll continue to follow-up aggressive therapeutic lifestyle changes.  3. Congestive heart failure, unspecified congestive heart failure chronicity, unspecified congestive heart failure type ([place]) -Continue to follow-up with cardiology as planned  4. Vitamin D deficiency -Continue current treatment pending results of lab work  5. Pure hypercholesterolemia -Continue current treatment pending results of lab work  6. Hypertension -Blood pressure is good today and he will continue with current treatment  7. Malaise and fatigue -We will check another thyroid panel and CBC today because of persistent malaise and fatigue. - Home Health - Face-to-face encounter (required for Medicare/Medicaid patients)  8. Morbid obesity ([place]) -He'll try to work with his diet and we will arrange for physical therapy to come in and strengthen him so that  he does not continue to get weaker. - Home Health - Face-to-face encounter (required for Medicare/Medicaid patients)  9. Chronic atrial fibrillation [facility]) -Follow-up with cardiology as planned - Home Health - Face-to-face encounter (required for Medicare/Medicaid patients)  10. Chronic diastolic heart failure ([place]) -Follow-up with cardiology as planned - Home Health - Face-to-face encounter (required for Medicare/Medicaid patients)  11. 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 SOB (shortness of breath) -Arrange for physical therapy and continue O2 - Home Health - Face-to-face encounter (required for Medicare/Medicaid patients)  14. Generalized weakness -Arrange for physical therapy - Home Health - Face-to-face encounter (required for Medicare/Medicaid  patients)  15. Gait instability -Arrange for physical therapy - Home Health - Face-to-face encounter (required for Medicare/Medicaid patients)  16. Other fatigue -Recheck labs follow-up with clinical pharmacy for diabetes - Home Health - Face-to-face encounter (required for Medicare/Medicaid patients)  No orders of the defined types were placed in this encounter.  Patient Instructions                       Medicare Annual Wellness Visit  [facility] and the medical providers at [facility] strive to bring you the best medical care.  In doing so we not only want to address your current medical conditions and concerns but also to detect new conditions early and prevent illness, disease and health-related problems.    Medicare offers a yearly Wellness Visit which allows our clinical staff to assess your need for preventative services including immunizations, lifestyle education, counseling to decrease risk of preventable diseases and screening for  fall risk and other medical concerns.    This visit is provided free of charge (no copay) for all Medicare recipients. The clinical pharmacists at [facility] have begun to conduct these Wellness Visits which will also include a thorough review of all your medications.    As you primary medical provider recommend that you make an appointment for your Annual Wellness Visit if you have not done so already this year.  You may set up this appointment before you leave today or you may call back ([phone]) and schedule an appointment.  Please make sure when you call that you mention that you are scheduling your Annual Wellness Visit with the clinical pharmacist so that the appointment may be made for the proper length of time.     Continue current medications. Continue good therapeutic lifestyle changes which include good diet and exercise. Fall precautions discussed with patient. If an FOBT was given today- please return it to our front desk. If you are over 29 years old - you may need Prevnar 45 or the adult Pneumonia vaccine.  **Flu shots are available--- please call and schedule a FLU-CLINIC appointment**  After your visit with [place] today you will receive a survey in the mail or online from [employer] regarding your care with [place]. Please take a moment to fill this out. Your feedback is very important to [place] as you can help [place] better understand your patient needs as well as improve your experience and satisfaction. WE CARE ABOUT YOU!!!     Arrie Senate MD

## 2016-07-14 NOTE — Addendum Note (Signed)
Addended by: Zannie Cove on: 07/14/2016 05:26 PM   Modules accepted: Orders

## 2016-07-15 ENCOUNTER — Other Ambulatory Visit: Payer: Self-pay | Admitting: Family Medicine

## 2016-07-15 LAB — CBC WITH DIFFERENTIAL/PLATELET
BASOS ABS: 0 10*3/uL (ref 0.0–0.2)
Basos: 0 %
EOS (ABSOLUTE): 0.1 10*3/uL (ref 0.0–0.4)
Eos: 1 %
HEMOGLOBIN: 13.5 g/dL (ref 13.0–17.7)
Hematocrit: 44.3 % (ref 37.5–51.0)
Immature Grans (Abs): 0 10*3/uL (ref 0.0–0.1)
Immature Granulocytes: 0 %
LYMPHS ABS: 1.2 10*3/uL (ref 0.7–3.1)
Lymphs: 14 %
MCH: 29.3 pg (ref 26.6–33.0)
MCHC: 30.5 g/dL — AB (ref 31.5–35.7)
MCV: 96 fL (ref 79–97)
MONOCYTES: 5 %
MONOS ABS: 0.4 10*3/uL (ref 0.1–0.9)
Neutrophils Absolute: 6.7 10*3/uL (ref 1.4–7.0)
Neutrophils: 80 %
PLATELETS: 187 10*3/uL (ref 150–379)
RBC: 4.61 x10E6/uL (ref 4.14–5.80)
RDW: 15.5 % — AB (ref 12.3–15.4)
WBC: 8.3 10*3/uL (ref 3.4–10.8)

## 2016-07-15 LAB — BMP8+EGFR
BUN/Creatinine Ratio: 18 (ref 10–24)
BUN: 15 mg/dL (ref 8–27)
CHLORIDE: 90 mmol/L — AB (ref 96–106)
CO2: 36 mmol/L — AB (ref 18–29)
Calcium: 9.1 mg/dL (ref 8.6–10.2)
Creatinine, Ser: 0.82 mg/dL (ref 0.76–1.27)
GFR calc Af Amer: 107 mL/min/{1.73_m2} (ref >59)
GFR calc non Af Amer: 93 mL/min/{1.73_m2} (ref >59)
GLUCOSE: 409 mg/dL — AB (ref 65–99)
POTASSIUM: 4.8 mmol/L (ref 3.5–5.2)
Sodium: 139 mmol/L (ref 134–144)

## 2016-07-15 LAB — LIPID PANEL
CHOL/HDL RATIO: 3.5 ratio (ref 0.0–5.0)
Cholesterol, Total: 146 mg/dL (ref 100–199)
HDL: 42 mg/dL (ref >39)
LDL CALC: 44 mg/dL (ref 0–99)
Triglycerides: 298 mg/dL — ABNORMAL HIGH (ref 0–149)
VLDL Cholesterol Cal: 60 mg/dL — ABNORMAL HIGH (ref 5–40)

## 2016-07-15 LAB — HEPATIC FUNCTION PANEL
ALBUMIN: 3.5 g/dL — AB (ref 3.6–4.8)
ALT: 8 IU/L (ref 0–44)
AST: 14 IU/L (ref 0–40)
Alkaline Phosphatase: 82 IU/L (ref 39–117)
Bilirubin Total: 1 mg/dL (ref 0.0–1.2)
Bilirubin, Direct: 0.25 mg/dL (ref 0.00–0.40)
TOTAL PROTEIN: 6.2 g/dL (ref 6.0–8.5)

## 2016-07-15 LAB — THYROID PANEL WITH TSH
Free Thyroxine Index: 1.5 (ref 1.2–4.9)
T3 Uptake Ratio: 29 % (ref 24–39)
T4 TOTAL: 5.2 ug/dL (ref 4.5–12.0)
TSH: 1.18 u[IU]/mL (ref 0.450–4.500)

## 2016-07-15 LAB — VITAMIN D 25 HYDROXY (VIT D DEFICIENCY, FRACTURES): Vit D, 25-Hydroxy: 47.4 ng/mL (ref 30.0–100.0)

## 2016-07-19 ENCOUNTER — Telehealth: Payer: Self-pay | Admitting: Family Medicine

## 2016-07-19 LAB — URINE CULTURE

## 2016-07-19 NOTE — Telephone Encounter (Signed)
Please get this filled out as soon as possible and call his wife when it is completed

## 2016-07-19 NOTE — Telephone Encounter (Signed)
Sent this am

## 2016-07-24 DIAGNOSIS — I5040 Unspecified combined systolic (congestive) and diastolic (congestive) heart failure: Secondary | ICD-10-CM | POA: Diagnosis not present

## 2016-07-24 DIAGNOSIS — J441 Chronic obstructive pulmonary disease with (acute) exacerbation: Secondary | ICD-10-CM | POA: Diagnosis not present

## 2016-07-25 ENCOUNTER — Encounter: Payer: Self-pay | Admitting: Family Medicine

## 2016-07-25 ENCOUNTER — Ambulatory Visit (INDEPENDENT_AMBULATORY_CARE_PROVIDER_SITE_OTHER): Payer: Medicare HMO | Admitting: Family Medicine

## 2016-07-25 VITALS — BP 110/70 | HR 73 | Temp 97.0°F | Ht 71.0 in | Wt 299.2 lb

## 2016-07-25 DIAGNOSIS — J449 Chronic obstructive pulmonary disease, unspecified: Secondary | ICD-10-CM

## 2016-07-25 DIAGNOSIS — R531 Weakness: Secondary | ICD-10-CM | POA: Diagnosis not present

## 2016-07-25 DIAGNOSIS — R159 Full incontinence of feces: Secondary | ICD-10-CM | POA: Diagnosis not present

## 2016-07-25 DIAGNOSIS — R5381 Other malaise: Secondary | ICD-10-CM

## 2016-07-25 NOTE — Progress Notes (Signed)
   HPI  Patient presents today here for evaluation of weakness and fatigue.  Patient has severe generalized weakness, deconditioning, and a marked change per the history of his wife after his hospitalization in December. He has had some waxing waning recovery, however overall he just cannot seem to regain the previous functional status that he did have.  Patient feels that he would benefit from intensive physical therapy from a skilled nursing facility. He has had home health physical therapy without improvement.  His wife complains of some memory loss.  He also has intermittent bowel incontinence.  Patient has complex medical history with atrial fibrillation, type 2 diabetes, B-cell lymphoma, and COPD with 3 L of oxygen via nasal cannula at baseline.  PMH: Smoking status noted ROS: Per HPI  Objective: BP 110/70   Pulse 73   Temp 97 F (36.1 C) (Oral)   Ht 5\' 11"  (1.803 m)   Wt 299 lb 3.2 oz (135.7 kg)   SpO2 97%   BMI 41.73 kg/m  Gen: NAD, alert, somnolent on exam HEENT: NCAT CV: Distant heart sounds Resp: Soft wheezes, nonlabored, oxygen via nasal cannula in place Ext: No edema, warm Neuro: Alert and oriented, No gross deficits  Assessment and plan:  # Generalized weakness, deconditioning. Patient with drastic change per his wife's report from previous to his auscultation. Patient was hospitalized in December 2017 with hypoglycemia and the restroom failure from pneumonia/CHF exacerbation. It does appear that he would benefit from intensive physical therapy to regain his previous functional status.  # Intermittent bowel incontinence New problem, continue to monitor  # COPD Severe, stable, on 3 L of oxygen via nasal cannula No changes   Laroy Apple, MD [facility] 07/25/2016, 9:25 AM

## 2016-07-25 NOTE — Patient Instructions (Signed)
Great to meet you!  We are working on details and paperwork, Barnett Applebaum will follow up with you by phone.

## 2016-07-27 DIAGNOSIS — E7800 Pure hypercholesterolemia, unspecified: Secondary | ICD-10-CM | POA: Diagnosis not present

## 2016-07-27 DIAGNOSIS — D649 Anemia, unspecified: Secondary | ICD-10-CM | POA: Diagnosis not present

## 2016-07-27 DIAGNOSIS — I4891 Unspecified atrial fibrillation: Secondary | ICD-10-CM | POA: Diagnosis not present

## 2016-07-27 DIAGNOSIS — E782 Mixed hyperlipidemia: Secondary | ICD-10-CM | POA: Diagnosis not present

## 2016-07-27 DIAGNOSIS — R531 Weakness: Secondary | ICD-10-CM | POA: Diagnosis not present

## 2016-07-27 DIAGNOSIS — E119 Type 2 diabetes mellitus without complications: Secondary | ICD-10-CM | POA: Diagnosis not present

## 2016-07-27 DIAGNOSIS — G4733 Obstructive sleep apnea (adult) (pediatric): Secondary | ICD-10-CM | POA: Diagnosis not present

## 2016-07-27 DIAGNOSIS — I5040 Unspecified combined systolic (congestive) and diastolic (congestive) heart failure: Secondary | ICD-10-CM | POA: Diagnosis not present

## 2016-07-27 DIAGNOSIS — Z79899 Other long term (current) drug therapy: Secondary | ICD-10-CM | POA: Diagnosis not present

## 2016-07-27 DIAGNOSIS — E039 Hypothyroidism, unspecified: Secondary | ICD-10-CM | POA: Diagnosis not present

## 2016-07-27 DIAGNOSIS — K219 Gastro-esophageal reflux disease without esophagitis: Secondary | ICD-10-CM | POA: Diagnosis not present

## 2016-07-27 DIAGNOSIS — J189 Pneumonia, unspecified organism: Secondary | ICD-10-CM | POA: Diagnosis not present

## 2016-07-27 DIAGNOSIS — I10 Essential (primary) hypertension: Secondary | ICD-10-CM | POA: Diagnosis not present

## 2016-07-27 DIAGNOSIS — D688 Other specified coagulation defects: Secondary | ICD-10-CM | POA: Diagnosis not present

## 2016-07-27 DIAGNOSIS — I509 Heart failure, unspecified: Secondary | ICD-10-CM | POA: Diagnosis not present

## 2016-07-27 DIAGNOSIS — D518 Other vitamin B12 deficiency anemias: Secondary | ICD-10-CM | POA: Diagnosis not present

## 2016-07-27 DIAGNOSIS — J441 Chronic obstructive pulmonary disease with (acute) exacerbation: Secondary | ICD-10-CM | POA: Diagnosis not present

## 2016-07-27 DIAGNOSIS — E559 Vitamin D deficiency, unspecified: Secondary | ICD-10-CM | POA: Diagnosis not present

## 2016-07-27 DIAGNOSIS — J449 Chronic obstructive pulmonary disease, unspecified: Secondary | ICD-10-CM | POA: Diagnosis not present

## 2016-07-27 DIAGNOSIS — M6281 Muscle weakness (generalized): Secondary | ICD-10-CM | POA: Diagnosis not present

## 2016-08-02 DIAGNOSIS — E119 Type 2 diabetes mellitus without complications: Secondary | ICD-10-CM | POA: Diagnosis not present

## 2016-08-02 DIAGNOSIS — K219 Gastro-esophageal reflux disease without esophagitis: Secondary | ICD-10-CM | POA: Diagnosis not present

## 2016-08-02 DIAGNOSIS — J449 Chronic obstructive pulmonary disease, unspecified: Secondary | ICD-10-CM | POA: Diagnosis not present

## 2016-08-02 DIAGNOSIS — I4891 Unspecified atrial fibrillation: Secondary | ICD-10-CM | POA: Diagnosis not present

## 2016-08-05 DIAGNOSIS — J449 Chronic obstructive pulmonary disease, unspecified: Secondary | ICD-10-CM | POA: Diagnosis not present

## 2016-08-05 DIAGNOSIS — K219 Gastro-esophageal reflux disease without esophagitis: Secondary | ICD-10-CM | POA: Diagnosis not present

## 2016-08-05 DIAGNOSIS — J189 Pneumonia, unspecified organism: Secondary | ICD-10-CM | POA: Diagnosis not present

## 2016-08-05 DIAGNOSIS — I4891 Unspecified atrial fibrillation: Secondary | ICD-10-CM | POA: Diagnosis not present

## 2016-08-07 DIAGNOSIS — I5040 Unspecified combined systolic (congestive) and diastolic (congestive) heart failure: Secondary | ICD-10-CM | POA: Diagnosis not present

## 2016-08-07 DIAGNOSIS — M6281 Muscle weakness (generalized): Secondary | ICD-10-CM | POA: Diagnosis not present

## 2016-08-07 DIAGNOSIS — J449 Chronic obstructive pulmonary disease, unspecified: Secondary | ICD-10-CM | POA: Diagnosis not present

## 2016-08-07 DIAGNOSIS — R531 Weakness: Secondary | ICD-10-CM | POA: Diagnosis not present

## 2016-08-07 DIAGNOSIS — J441 Chronic obstructive pulmonary disease with (acute) exacerbation: Secondary | ICD-10-CM | POA: Diagnosis not present

## 2016-08-09 ENCOUNTER — Encounter: Payer: Self-pay | Admitting: Internal Medicine

## 2016-08-09 ENCOUNTER — Ambulatory Visit (INDEPENDENT_AMBULATORY_CARE_PROVIDER_SITE_OTHER): Payer: Medicare HMO | Admitting: Internal Medicine

## 2016-08-09 VITALS — BP 126/82 | HR 57 | Ht 71.0 in | Wt 300.8 lb

## 2016-08-09 DIAGNOSIS — J449 Chronic obstructive pulmonary disease, unspecified: Secondary | ICD-10-CM

## 2016-08-09 DIAGNOSIS — G4733 Obstructive sleep apnea (adult) (pediatric): Secondary | ICD-10-CM

## 2016-08-09 DIAGNOSIS — J9611 Chronic respiratory failure with hypoxia: Secondary | ICD-10-CM | POA: Diagnosis not present

## 2016-08-09 DIAGNOSIS — J439 Emphysema, unspecified: Secondary | ICD-10-CM | POA: Diagnosis not present

## 2016-08-09 MED ORDER — UMECLIDINIUM-VILANTEROL 62.5-25 MCG/INH IN AEPB: 1.0000 | INHALATION_SPRAY | Freq: Every day | RESPIRATORY_TRACT | 12 refills | Status: DC

## 2016-08-09 MED ORDER — ALBUTEROL SULFATE HFA 108 (90 BASE) MCG/ACT IN AERS: INHALATION_SPRAY | RESPIRATORY_TRACT | 12 refills | Status: DC

## 2016-08-09 NOTE — Progress Notes (Signed)
HPI male former smoker followed for COPD/chronic hypoxic respiratory failure, OSA, lung nodules, allergic rhinitis, complicated by history lymphoma, DM, A. fib/Coumadin, morbid obesity NPSG 04/07/14-  AHI 75.1/ hr, desaturation to 79% ( O2 was added at 2L), CPAP to  16, Body weight 338 lbs PFT 06/17/14- severe obstructive airways disease with slight response to bronchodilator. FVC 2.03/42%, FEV1 1.02/28%, ratio 0.5, TLC 82%, DLCO 59% ----------------------------------------------------------------- 07/02/2015-66 year old male former smoker followed for COPD/chronic hypoxic respiratory failure, OSA, complicated by history lymphoma, DM, A. fib/Coumadin, morbid obesity CPAP auto 12-20/ [facility]   O2 2L continuous FOLLOWS FOR: Per Dr Laurance Flatten; was in Hospital [facility] in middle of Feb 2017.  Hospital discharge 06/09/2015-acute on chronic respiratory failure due to CPAP/bronchitis, exacerbation COPD, rapid A. fib when he got off of his beta blocker.Marland Kitchen Has finished Levaquin He says he is back to "baseline". He has been given a diuretic. Does notice seasonal rhinitis with congestion and drainage and asks treatment recommendations. CT chest 04/09/2015 IMPRESSION: 1. Tiny scattered sub 4 mm pulmonary nodules are stable. 2. No adenopathy in the chest. 3. Stable retroperitoneal lymph node. No new or progressive lymph nodes in the abdomen/pelvis. 4. Stable sclerotic and slightly enlarged right tenth rib as discussed above. Electronically Signed  By: Marijo Sanes M.D.  On: 04/09/2015 11: CXR 06/07/2015 1 V IMPRESSION: Vascular congestion noted. Increased interstitial markings may reflect mild pulmonary edema. Electronically Signed  By: Garald Balding M.D.  On: 06/07/2015 02:01  01/04/2016-66 year old male former smoker followed for COPD/chronic hypoxic respiratory failure, OSA, allergic rhinitis, lung nodules complicated by history lymphoma, DM, A. fib/Coumadin, morbid obesity CPAP auto  12-20/[facility]  O2 2 L continuous Declines flu shot this early- gets from PCP Has not been using CPAP, saying he changed bedrooms and didn't move the machine. Denies CPAP is uncomfortable. Breathing has felt stable with no cough, wheeze or consistent need for portable oxygen. He does sleep with it. Meds are okay.  08/09/2016- 65 year old male former smoker followed for COPD/chronic hypoxic respiratory failure, OSA, allergic rhinitis, lung nodules, complicated by history lymphoma, DM, A. fib/Coumadin, morbid obesity CPAP auto 12-20/[facility]  O2 2 L continuous FOLLOWS FOR: DME: [facility]. Pt continues O2 24/7; has not used CPAP properly in past month due to mask fitting. Needs help with getting a better fitted mask. DL attached.  Says CPAP mask irritates his face and the airflow is drying him out too much. We reviewed comfort management and communication with his DME company.  ROS-see HPI Constitutional:   No-   weight loss, night sweats, fevers, chills, +fatigue, lassitude. HEENT:   No-  headaches, difficulty swallowing, tooth/dental problems, sore throat,       No-  sneezing, itching, ear ache, nasal congestion, post nasal drip,  CV:  No-   chest pain, orthopnea, PND, swelling in lower extremities, anasarca,                                                             dizziness, palpitations Resp: + shortness of breath with exertion or at rest.              No-   productive cough,  No non-productive cough,  No- coughing up of blood.              No-  change in color of mucus.   Skin: No-   rash or lesions. GI:  No-   heartburn, indigestion, abdominal pain, nausea, vomiting,  GU:  MS:  No-   joint pain or swelling.  . Neuro-     nothing unusual Psych:  No- change in mood or affect. No depression or anxiety.  No memory loss.  OBJ- Physical Exam   + wearing O2 General- Alert, Oriented, Affect-appropriate, Distress- none acute, +morbidly obese Skin- rash-none, lesions- none, excoriation-  none Lymphadenopathy- none Head- atraumatic            Eyes- Gross vision intact, PERRLA, conjunctivae and secretions clear,             Ears- Hearing, canals-normal            Nose- Clear, no-Septal dev, mucus, polyps, erosion, perforation             Throat- Mallampati II , mucosa clear , drainage- none, tonsils- atrophic Neck- flexible , trachea midline, no stridor , thyroid nl, carotid no bruit Chest - symmetrical excursion , unlabored           Heart/CV- IRR +, no murmur , no gallop  , no rub, nl s1 s2                           - JVD- none , edema- none, stasis changes- none, varices- none           Lung- decreased airflow/clear, unlabored, wheeze-none none , dullness-none, rub- none           Chest wall-  Abd-  Br/ Gen/ Rectal- Not done, not indicated Extrem- cyanosis- none, clubbing, none, atrophy- none, strength- nl Neuro- grossly intact to observation

## 2016-08-09 NOTE — Assessment & Plan Note (Signed)
We need significant intervention to get him on track with CPAP use. Plan-education done. Referral to daytime sleep center technician for CPAP desensitization and mask fit

## 2016-08-09 NOTE — Patient Instructions (Signed)
Order- referral to daytime sleep center tech for CPAP desensitization and mask fit                         CPAP auto 12-20, mask of choice, humidifier, supplies, AirView   Dx OSA  Order- DME [facility] please refill DuoNeb neb solution to continue 1 vial every 6 hours as needed             Dx COPD mixed type                     Continuing O2 2l and CPAP as before  Refills for albuterol rescue inhaler and for Anoro maintenance inhaler sent

## 2016-08-09 NOTE — Assessment & Plan Note (Signed)
Plan-continuing with refill for Anoro, Proair, neb solution

## 2016-08-09 NOTE — Assessment & Plan Note (Signed)
He is coping well with oxygen using a portable oxygen concentrator. He will maintain contact with his DME company. Ordering CXR

## 2016-08-15 ENCOUNTER — Other Ambulatory Visit: Payer: Self-pay | Admitting: Pharmacist

## 2016-08-16 ENCOUNTER — Ambulatory Visit (INDEPENDENT_AMBULATORY_CARE_PROVIDER_SITE_OTHER): Payer: Medicare HMO | Admitting: Pharmacist

## 2016-08-16 DIAGNOSIS — I4821 Permanent atrial fibrillation: Secondary | ICD-10-CM

## 2016-08-16 DIAGNOSIS — Z7901 Long term (current) use of anticoagulants: Secondary | ICD-10-CM

## 2016-08-16 DIAGNOSIS — I482 Chronic atrial fibrillation: Secondary | ICD-10-CM

## 2016-08-16 LAB — COAGUCHEK XS/INR WAIVED
INR: 4.1 — ABNORMAL HIGH (ref 0.9–1.1)
Prothrombin Time: 49.1 s

## 2016-08-16 MED ORDER — WARFARIN SODIUM 5 MG PO TABS: 5.0000 mg | ORAL_TABLET | Freq: Every day | ORAL | 0 refills | Status: DC

## 2016-08-23 DIAGNOSIS — I5040 Unspecified combined systolic (congestive) and diastolic (congestive) heart failure: Secondary | ICD-10-CM | POA: Diagnosis not present

## 2016-08-23 DIAGNOSIS — J441 Chronic obstructive pulmonary disease with (acute) exacerbation: Secondary | ICD-10-CM | POA: Diagnosis not present

## 2016-08-31 ENCOUNTER — Ambulatory Visit (INDEPENDENT_AMBULATORY_CARE_PROVIDER_SITE_OTHER): Payer: Medicare HMO | Admitting: Pharmacist

## 2016-08-31 DIAGNOSIS — Z7901 Long term (current) use of anticoagulants: Secondary | ICD-10-CM

## 2016-08-31 DIAGNOSIS — R791 Abnormal coagulation profile: Secondary | ICD-10-CM

## 2016-08-31 DIAGNOSIS — I482 Chronic atrial fibrillation: Secondary | ICD-10-CM | POA: Diagnosis not present

## 2016-08-31 DIAGNOSIS — I4821 Permanent atrial fibrillation: Secondary | ICD-10-CM

## 2016-08-31 LAB — COAGUCHEK XS/INR WAIVED

## 2016-08-31 LAB — FINGERSTICK HEMOGLOBIN: Hemoglobin: 15.2 g/dL (ref 12.6–17.7)

## 2016-08-31 MED ORDER — PHYTONADIONE 5 MG PO TABS
5.0000 mg | ORAL_TABLET | Freq: Once | ORAL | Status: AC
  Administered 2016-08-31: 5 mg via ORAL

## 2016-09-01 ENCOUNTER — Ambulatory Visit (INDEPENDENT_AMBULATORY_CARE_PROVIDER_SITE_OTHER): Payer: Medicare HMO | Admitting: Pharmacist

## 2016-09-01 DIAGNOSIS — Z7901 Long term (current) use of anticoagulants: Secondary | ICD-10-CM

## 2016-09-01 DIAGNOSIS — I482 Chronic atrial fibrillation: Secondary | ICD-10-CM | POA: Diagnosis not present

## 2016-09-01 DIAGNOSIS — I4821 Permanent atrial fibrillation: Secondary | ICD-10-CM

## 2016-09-01 LAB — CBC WITH DIFFERENTIAL/PLATELET
BASOS ABS: 0 10*3/uL (ref 0.0–0.2)
Basos: 0 %
EOS (ABSOLUTE): 0.1 10*3/uL (ref 0.0–0.4)
EOS: 1 %
HEMATOCRIT: 47.5 % (ref 37.5–51.0)
Hemoglobin: 15.6 g/dL (ref 13.0–17.7)
Immature Grans (Abs): 0 10*3/uL (ref 0.0–0.1)
Immature Granulocytes: 0 %
LYMPHS ABS: 1.8 10*3/uL (ref 0.7–3.1)
Lymphs: 22 %
MCH: 30.2 pg (ref 26.6–33.0)
MCHC: 32.8 g/dL (ref 31.5–35.7)
MCV: 92 fL (ref 79–97)
Monocytes Absolute: 0.4 10*3/uL (ref 0.1–0.9)
Monocytes: 5 %
Neutrophils Absolute: 5.8 10*3/uL (ref 1.4–7.0)
Neutrophils: 72 %
Platelets: 181 10*3/uL (ref 150–379)
RBC: 5.16 x10E6/uL (ref 4.14–5.80)
RDW: 14 % (ref 12.3–15.4)
WBC: 8.1 10*3/uL (ref 3.4–10.8)

## 2016-09-01 LAB — PROTIME-INR
INR: 12.9 (ref 0.8–1.2)
PROTHROMBIN TIME: 113.8 s — AB (ref 9.1–12.0)

## 2016-09-01 LAB — COAGUCHEK XS/INR WAIVED
INR: 3.7 — AB (ref 0.9–1.1)
Prothrombin Time: 44.7 s

## 2016-09-01 NOTE — Patient Instructions (Signed)
Anticoagulation Warfarin Dose Instructions as of 09/01/2016      Justin Ruiz Tue Wed Thu Fri Sat   May 9th thru May 12th    Hold Hold 5 mg 2.5 mg   May 13th thru May 19th 2.5 mg 5 mg 2.5 mg 5 mg 2.5 mg 5 mg 2.5 mg    Description   No warfarin today - Thursday, May 10th.  Then start new warfarin 5mg  tablet dose - take 1 tablet on Mondays, Wednesdays and Fridays.  Take 1/2 tablet all other days. INR was 3.7 today

## 2016-09-04 DIAGNOSIS — M6281 Muscle weakness (generalized): Secondary | ICD-10-CM | POA: Diagnosis not present

## 2016-09-04 DIAGNOSIS — J441 Chronic obstructive pulmonary disease with (acute) exacerbation: Secondary | ICD-10-CM | POA: Diagnosis not present

## 2016-09-04 DIAGNOSIS — I5040 Unspecified combined systolic (congestive) and diastolic (congestive) heart failure: Secondary | ICD-10-CM | POA: Diagnosis not present

## 2016-09-04 DIAGNOSIS — J449 Chronic obstructive pulmonary disease, unspecified: Secondary | ICD-10-CM | POA: Diagnosis not present

## 2016-09-04 DIAGNOSIS — R531 Weakness: Secondary | ICD-10-CM | POA: Diagnosis not present

## 2016-09-07 ENCOUNTER — Ambulatory Visit (INDEPENDENT_AMBULATORY_CARE_PROVIDER_SITE_OTHER): Payer: Medicare HMO | Admitting: Pharmacist

## 2016-09-07 DIAGNOSIS — Z7901 Long term (current) use of anticoagulants: Secondary | ICD-10-CM | POA: Diagnosis not present

## 2016-09-07 DIAGNOSIS — I4821 Permanent atrial fibrillation: Secondary | ICD-10-CM

## 2016-09-07 DIAGNOSIS — I482 Chronic atrial fibrillation: Secondary | ICD-10-CM | POA: Diagnosis not present

## 2016-09-07 LAB — COAGUCHEK XS/INR WAIVED
INR: 3 — ABNORMAL HIGH (ref 0.9–1.1)
Prothrombin Time: 36 s

## 2016-09-08 DIAGNOSIS — J9611 Chronic respiratory failure with hypoxia: Secondary | ICD-10-CM | POA: Diagnosis not present

## 2016-09-08 DIAGNOSIS — J439 Emphysema, unspecified: Secondary | ICD-10-CM | POA: Diagnosis not present

## 2016-09-08 DIAGNOSIS — J449 Chronic obstructive pulmonary disease, unspecified: Secondary | ICD-10-CM | POA: Diagnosis not present

## 2016-09-12 DIAGNOSIS — G4733 Obstructive sleep apnea (adult) (pediatric): Secondary | ICD-10-CM | POA: Diagnosis not present

## 2016-09-14 ENCOUNTER — Other Ambulatory Visit (HOSPITAL_BASED_OUTPATIENT_CLINIC_OR_DEPARTMENT_OTHER): Payer: Medicare HMO

## 2016-09-20 ENCOUNTER — Ambulatory Visit (HOSPITAL_BASED_OUTPATIENT_CLINIC_OR_DEPARTMENT_OTHER): Payer: Medicare HMO | Attending: Internal Medicine | Admitting: Radiology

## 2016-09-20 DIAGNOSIS — G4733 Obstructive sleep apnea (adult) (pediatric): Secondary | ICD-10-CM

## 2016-09-21 ENCOUNTER — Ambulatory Visit (INDEPENDENT_AMBULATORY_CARE_PROVIDER_SITE_OTHER): Payer: Medicare HMO | Admitting: Pharmacist

## 2016-09-21 DIAGNOSIS — I482 Chronic atrial fibrillation: Secondary | ICD-10-CM

## 2016-09-21 DIAGNOSIS — Z794 Long term (current) use of insulin: Secondary | ICD-10-CM

## 2016-09-21 DIAGNOSIS — Z7901 Long term (current) use of anticoagulants: Secondary | ICD-10-CM

## 2016-09-21 DIAGNOSIS — E119 Type 2 diabetes mellitus without complications: Secondary | ICD-10-CM | POA: Diagnosis not present

## 2016-09-21 DIAGNOSIS — Z0289 Encounter for other administrative examinations: Secondary | ICD-10-CM

## 2016-09-21 DIAGNOSIS — I4821 Permanent atrial fibrillation: Secondary | ICD-10-CM

## 2016-09-21 LAB — COAGUCHEK XS/INR WAIVED
INR: 1.2 — ABNORMAL HIGH (ref 0.9–1.1)
Prothrombin Time: 14.2 s

## 2016-09-21 LAB — BAYER DCA HB A1C WAIVED: HB A1C: 8 % — AB (ref <7.0)

## 2016-09-21 NOTE — Progress Notes (Signed)
Patient ID: Justin Ruiz, male   DOB: 08/15/1950, 66 y.o.   MRN: [id number]  Subjective:  CC: patient is here today for INR / follow up chronic anticoagulation. We are also suppose to review DM but patient did not bring glucometer with him today.   Anticoagulation:  Indication: aortic valve regurgitation Bleeding signs/symptoms: None Thromboembolic signs/symptoms: None  Missed Coumadin doses: None Medication changes: no Dietary changes: no Bacterial/viral infection: no Other concerns: no  Type 2 DM requiring insulin:  Mr Justin Ruiz has had DM for several years. He currently taking NPH insulin 50 units daily before breakfast and Novolin R 6 units prior to each meal   About 6 months ago [name] was hospitalized for a severe hypoglycemia event.  For awhile after event he was having trouble with memory and focus but this seems to have improved.  Patient checked BG 3 to 4 times a day  No glucometer today but patient reports HBG readings range in am from 90's to 160's.  After meals can be 150's to 220's  Objective:    INR Today: 1.2 Current dose: 5mg  on Mondays; 2.5mg  all other days. A1c = 8.0% today  Assessment:    Subtherapeutic INR for goal of 2-3   Insulin requiring DM - With multiple medical problems and history of hypoglycemia recommend A1c target of 7.5 to 8.0%   Plan:    1. New dose: Take 1 and 1/2 tablets for 2 days.  Then increase warfarin dose to 5mg  MWF and 2.5mg  all other days   2. Next INR: 1 week   3.  Continue current insulin regimen 4.  Checking into coverage of personal CGM which I think would greatly benefit patient.  If is not covered then will repeat pro CMG.

## 2016-09-21 NOTE — Patient Instructions (Signed)
Anticoagulation Warfarin Dose Instructions as of 09/21/2016      Dorene Grebe Tue Wed Thu Fri Sat   09/21/2016 thru 09/24/2016    7.5 mg  1 and 1/2 tablets (take an extra 1 tablet when you get home) 7.5 mg   1 and 1/2 tablets 5 mg   1 tablet 2.5 mg   1/2 tablet   09/25/2016 thru 10/01/2016 2.5 mg   1/2 tablet 5 mg   1 tablet 2.5 mg   1/2 tablet 5 mg   1 tablet 2.5 mg  1/2 ta blet  5 mg   1 tablet 2.5 mg   1/2 tablet    Description   Take 1 tablet when you get home today - Wednesday, May 30th.  Then take 1 and 1/2 tablets tomorrow - Thursday, May 31st.  Then increase warfarin dose to 1 tablet Mondays, Wednesdays and Fridays.  Take 1/2 tablet all other days.  INR was 1.2 today (too thick)

## 2016-09-23 ENCOUNTER — Other Ambulatory Visit: Payer: Self-pay | Admitting: *Deleted

## 2016-09-23 DIAGNOSIS — J441 Chronic obstructive pulmonary disease with (acute) exacerbation: Secondary | ICD-10-CM | POA: Diagnosis not present

## 2016-09-23 DIAGNOSIS — I5040 Unspecified combined systolic (congestive) and diastolic (congestive) heart failure: Secondary | ICD-10-CM | POA: Diagnosis not present

## 2016-09-23 MED ORDER — SIMVASTATIN 40 MG PO TABS: 40.0000 mg | ORAL_TABLET | Freq: Every day | ORAL | 0 refills | Status: DC

## 2016-09-30 ENCOUNTER — Ambulatory Visit (INDEPENDENT_AMBULATORY_CARE_PROVIDER_SITE_OTHER): Payer: Medicare HMO | Admitting: Pharmacist

## 2016-09-30 DIAGNOSIS — Z7901 Long term (current) use of anticoagulants: Secondary | ICD-10-CM

## 2016-09-30 DIAGNOSIS — I482 Chronic atrial fibrillation: Secondary | ICD-10-CM | POA: Diagnosis not present

## 2016-09-30 DIAGNOSIS — I4821 Permanent atrial fibrillation: Secondary | ICD-10-CM

## 2016-09-30 LAB — COAGUCHEK XS/INR WAIVED
INR: 4.2 — ABNORMAL HIGH (ref 0.9–1.1)
PROTHROMBIN TIME: 50.5 s

## 2016-09-30 NOTE — Patient Instructions (Signed)
Anticoagulation Warfarin Dose Instructions as of 09/30/2016      Sun Mon Tue Wed Thu Fri Sat   New Dose 2.5 mg 5 mg 2.5 mg 2.5 mg 2.5 mg 5 mg 2.5 mg    Description   Skip dose tomorrow (6/9) and Sunday (6/10). Then decrease warfarin dose to 1 tablet Mondays and Fridays.  Take 1/2 tablet all other days.  INR was 4.2 today (Goal: 2-3)

## 2016-10-05 DIAGNOSIS — M6281 Muscle weakness (generalized): Secondary | ICD-10-CM | POA: Diagnosis not present

## 2016-10-05 DIAGNOSIS — J449 Chronic obstructive pulmonary disease, unspecified: Secondary | ICD-10-CM | POA: Diagnosis not present

## 2016-10-05 DIAGNOSIS — R531 Weakness: Secondary | ICD-10-CM | POA: Diagnosis not present

## 2016-10-05 DIAGNOSIS — J441 Chronic obstructive pulmonary disease with (acute) exacerbation: Secondary | ICD-10-CM | POA: Diagnosis not present

## 2016-10-05 DIAGNOSIS — I5040 Unspecified combined systolic (congestive) and diastolic (congestive) heart failure: Secondary | ICD-10-CM | POA: Diagnosis not present

## 2016-10-09 DIAGNOSIS — J449 Chronic obstructive pulmonary disease, unspecified: Secondary | ICD-10-CM | POA: Diagnosis not present

## 2016-10-09 DIAGNOSIS — J439 Emphysema, unspecified: Secondary | ICD-10-CM | POA: Diagnosis not present

## 2016-10-09 DIAGNOSIS — J9611 Chronic respiratory failure with hypoxia: Secondary | ICD-10-CM | POA: Diagnosis not present

## 2016-10-11 ENCOUNTER — Other Ambulatory Visit: Payer: Self-pay | Admitting: Family Medicine

## 2016-10-11 ENCOUNTER — Other Ambulatory Visit: Payer: Self-pay | Admitting: *Deleted

## 2016-10-11 MED ORDER — SIMVASTATIN 40 MG PO TABS: 40.0000 mg | ORAL_TABLET | Freq: Every day | ORAL | 3 refills | Status: DC

## 2016-10-12 ENCOUNTER — Other Ambulatory Visit: Payer: Self-pay | Admitting: *Deleted

## 2016-10-14 ENCOUNTER — Ambulatory Visit (INDEPENDENT_AMBULATORY_CARE_PROVIDER_SITE_OTHER): Payer: Medicare HMO | Admitting: Pharmacist

## 2016-10-14 DIAGNOSIS — I4821 Permanent atrial fibrillation: Secondary | ICD-10-CM

## 2016-10-14 DIAGNOSIS — Z7901 Long term (current) use of anticoagulants: Secondary | ICD-10-CM | POA: Diagnosis not present

## 2016-10-14 DIAGNOSIS — I482 Chronic atrial fibrillation: Secondary | ICD-10-CM

## 2016-10-14 LAB — COAGUCHEK XS/INR WAIVED
INR: 1.3 — AB (ref 0.9–1.1)
Prothrombin Time: 15.9 s

## 2016-10-14 NOTE — Patient Instructions (Signed)
Anticoagulation Warfarin Dose Instructions as of 10/14/2016      Justin Ruiz Tue Wed Thu Fri Sat   New Dose 2.5 mg 5 mg 2.5 mg 2.5 mg 2.5 mg 5 mg 2.5 mg    Description   Since you already took 0.5 tablet today, take 1 more tablet today (6/22). Then take 1.5 tablets tomorrow (6/23). Then take warfarin dose of 1 tablet Mondays and Fridays.  Take 1/2 tablet all other days.  INR was 1.3 today (Goal: 2-3)

## 2016-10-19 ENCOUNTER — Encounter: Payer: Self-pay | Admitting: Family Medicine

## 2016-10-19 ENCOUNTER — Other Ambulatory Visit: Payer: Self-pay

## 2016-10-19 ENCOUNTER — Ambulatory Visit (INDEPENDENT_AMBULATORY_CARE_PROVIDER_SITE_OTHER): Payer: Medicare HMO | Admitting: Family Medicine

## 2016-10-19 ENCOUNTER — Ambulatory Visit (INDEPENDENT_AMBULATORY_CARE_PROVIDER_SITE_OTHER): Payer: Medicare HMO

## 2016-10-19 VITALS — BP 121/81 | HR 87 | Temp 97.2°F | Ht 71.0 in | Wt 289.0 lb

## 2016-10-19 DIAGNOSIS — Z794 Long term (current) use of insulin: Secondary | ICD-10-CM

## 2016-10-19 DIAGNOSIS — I5031 Acute diastolic (congestive) heart failure: Secondary | ICD-10-CM

## 2016-10-19 DIAGNOSIS — R0602 Shortness of breath: Secondary | ICD-10-CM

## 2016-10-19 DIAGNOSIS — R5383 Other fatigue: Secondary | ICD-10-CM

## 2016-10-19 DIAGNOSIS — E119 Type 2 diabetes mellitus without complications: Secondary | ICD-10-CM | POA: Diagnosis not present

## 2016-10-19 DIAGNOSIS — J449 Chronic obstructive pulmonary disease, unspecified: Secondary | ICD-10-CM

## 2016-10-19 DIAGNOSIS — I5032 Chronic diastolic (congestive) heart failure: Secondary | ICD-10-CM

## 2016-10-19 DIAGNOSIS — R609 Edema, unspecified: Secondary | ICD-10-CM

## 2016-10-19 DIAGNOSIS — I4891 Unspecified atrial fibrillation: Secondary | ICD-10-CM

## 2016-10-19 DIAGNOSIS — F419 Anxiety disorder, unspecified: Secondary | ICD-10-CM | POA: Diagnosis not present

## 2016-10-19 DIAGNOSIS — Z7901 Long term (current) use of anticoagulants: Secondary | ICD-10-CM | POA: Diagnosis not present

## 2016-10-19 LAB — BMP8+EGFR
BUN / CREAT RATIO: 20 (ref 10–24)
BUN: 22 mg/dL (ref 8–27)
CHLORIDE: 93 mmol/L — AB (ref 96–106)
CO2: 31 mmol/L — ABNORMAL HIGH (ref 20–29)
Calcium: 9.2 mg/dL (ref 8.6–10.2)
Creatinine, Ser: 1.08 mg/dL (ref 0.76–1.27)
GFR calc non Af Amer: 72 mL/min/{1.73_m2} (ref >59)
GFR, EST AFRICAN AMERICAN: 83 mL/min/{1.73_m2} (ref >59)
Glucose: 238 mg/dL — ABNORMAL HIGH (ref 65–99)
POTASSIUM: 4.4 mmol/L (ref 3.5–5.2)
SODIUM: 140 mmol/L (ref 134–144)

## 2016-10-19 LAB — CBC WITH DIFFERENTIAL/PLATELET
Basophils Absolute: 0 10*3/uL (ref 0.0–0.2)
Basos: 0 %
EOS (ABSOLUTE): 0.1 10*3/uL (ref 0.0–0.4)
Eos: 1 %
Hematocrit: 44.4 % (ref 37.5–51.0)
Hemoglobin: 14.6 g/dL (ref 13.0–17.7)
Immature Grans (Abs): 0 10*3/uL (ref 0.0–0.1)
Immature Granulocytes: 0 %
LYMPHS ABS: 1.3 10*3/uL (ref 0.7–3.1)
Lymphs: 18 %
MCH: 30.5 pg (ref 26.6–33.0)
MCHC: 32.9 g/dL (ref 31.5–35.7)
MCV: 93 fL (ref 79–97)
Monocytes Absolute: 0.5 10*3/uL (ref 0.1–0.9)
Monocytes: 7 %
NEUTROS ABS: 5.2 10*3/uL (ref 1.4–7.0)
Neutrophils: 74 %
PLATELETS: 190 10*3/uL (ref 150–379)
RBC: 4.78 x10E6/uL (ref 4.14–5.80)
RDW: 13.6 % (ref 12.3–15.4)
WBC: 7 10*3/uL (ref 3.4–10.8)

## 2016-10-19 LAB — COAGUCHEK XS/INR WAIVED
INR: 1.8 — AB (ref 0.9–1.1)
Prothrombin Time: 21.4 s

## 2016-10-19 MED ORDER — ALPRAZOLAM 0.25 MG PO TABS: 0.2500 mg | ORAL_TABLET | Freq: Every day | ORAL | 1 refills | Status: DC

## 2016-10-19 MED ORDER — FUROSEMIDE 40 MG PO TABS: 40.0000 mg | ORAL_TABLET | Freq: Two times a day (BID) | ORAL | 6 refills | Status: DC

## 2016-10-19 NOTE — Progress Notes (Signed)
Subjective:    Patient ID: Justin Ruiz, male    DOB: 1950/04/27, 66 y.o.   MRN: [id number]  HPI Patient here today for SOB, anxiety, and edema. He is accompanied today by his wife.The patient has had increased problems with his breathing with the shortness of breath episode last night. He's had some panic attacks been very bad for the past 2-3 days. He also complains of some edema. His weight today is 289 pounds. His O2 is 92% on 3 L. We will get a chest x-ray today and some blood work. He does have obstructive sleep apnea and is being followed by Dr. Baird Lyons. He also has permanent atrial fibrillation. He has a history of diffuse large B-cell lymphoma. On reviewing his last blood sugar readings it was running high at 409. The creatinine was good at that time. The electrolytes were good other than the chloride being low. The CBC had a stable hemoglobin at 13.5 is slightly decreased from the past. The white blood cell count was not elevated. Thyroid tests also were within normal limits at that time. He is on insulin regularly as well as metformin. He does have alprazolam at home that he takes as needed. The patient comes to the visit today with his wife. The patient denies any chest pain cough or fever. He denies any additional sputum production. He remains short of breath. He does not prop his feet up a lot. He also admits to being more anxious. He denies any trouble with his stomach including nausea vomiting diarrhea blood in the stool or black tarry bowel movements and is passing his water without problems. His wife is in the room with him supporting the responses to questions that he was asked.    Patient Active Problem List   Diagnosis Date Noted  . Memory loss 04/13/2016  . Hypomagnesemia   . Morbid obesity ([place]) 03/29/2016  . Hypoglycemia 03/28/2016  . Acute encephalopathy 03/28/2016  . Acute diastolic heart failure ([place]) 03/28/2016  . COPD mixed type ([place]) 03/28/2016  . Atrial  fibrillation with RVR ([place]) 03/28/2016  . Acute on chronic respiratory failure ([place]) 03/28/2016  . 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 Chronic diastolic heart failure ([place])   . CAP (community acquired pneumonia)   . Acute-on-chronic respiratory failure ([place]) 06/07/2015  . Sepsis ([place]) 06/07/2015  . Diabetic neuropathy, type II diabetes mellitus ([place]) 09/25/2014  . Chronic respiratory failure with hypoxia ([place]) 05/25/2014  . Obstructive sleep apnea 05/25/2014  . Benign neoplasm of ascending colon 05/05/2014  . Excessive daytime sleepiness 01/24/2014  . Mediastinal adenopathy 06/20/2013  . Severe obesity (BMI >= 40) ([place]) 05/01/2013  . Generalized anxiety disorder 05/01/2013  . Lung nodules 03/11/2013  . Chronic anticoagulation 08/20/2012  . FIBRILLATION, ATRIAL 05/12/2010  . NHL (non-Hodgkin's lymphoma) ([place]) 01/26/2010  . Obesity, morbid ([place]) 04/27/2009  . CARDIOVASCULAR STUDIES, ABNORMAL 04/27/2009  . ABNORMAL STRESS ELECTROCARDIOGRAM 03/25/2009  . PERSONAL HISTORY OF COLONIC POLYPS 03/02/2009  . Diabetes mellitus type 2, insulin dependent ([place]) 04/29/2007  . Hyperlipidemia 04/29/2007  . Hypertension 04/29/2007  . Seasonal and perennial allergic rhinitis 04/29/2007  . COPD exacerbation ([place]) 04/29/2007  . G E R D 04/29/2007  . BENIGN PROSTATIC HYPERTROPHY, HX OF 04/29/2007   Outpatient Encounter Prescriptions as of 10/19/2016  Medication Sig  . acetaminophen (TYLENOL) 500 MG tablet Take 1,000 mg by mouth every 6 (six) hours as needed for  headache. Reported on 10/08/2015  . albuterol (PROAIR HFA) 108 (90 Base) MCG/ACT inhaler Inhale 2 puffs every 4-6 hours -rescue inhaler  . ALPRAZolam (XANAX) 0.25 MG tablet TAKE ONE TABLET BY MOUTH AT BEDTIME  . Blood Glucose Calibration (TAI DOC CONTROL) NORMAL SOLN Use as directed.  . Blood Glucose Monitoring Suppl (CLEVER CHEK  AUTO-CODE VOICE) DEVI USE AS DIRECTED  . Cetirizine HCl 10 MG CAPS Take 1 capsule (10 mg total) by mouth at bedtime as needed. For itching  . Cholecalciferol (VITAMIN D3) 5000 UNITS TABS Take 1 tablet by mouth every morning.  . furosemide (LASIX) 40 MG tablet Take 1 tablet (40 mg total) by mouth daily.  Marland Kitchen guaiFENesin (MUCINEX) 600 MG 12 hr tablet Take 600 mg by mouth 2 (two) times daily as needed for cough or to loosen phlegm.  . insulin NPH Human (NOVOLIN N) 100 UNIT/ML injection Inject 0.5 mLs (50 Units total) into the skin daily before breakfast.  . insulin regular (NOVOLIN R,HUMULIN R) 250 units/2.3mL (100 units/mL) injection 6 units prior to each meal  . ipratropium-albuterol (DUONEB) 0.5-2.5 (3) MG/3ML SOLN PLACE 1 VIAL (3 MLS) IN THE NEBULIZER EVERY 6 HOURS AS NEEDED  . Lancet Devices (LANCING DEVICE) MISC USE AS DIRECTED  . metFORMIN (GLUCOPHAGE) 500 MG tablet TAKE ONE TABLET BY MOUTH IN THE MORNING AND TAKE TWO TABLETS IN THE EVENING  . metoprolol (LOPRESSOR) 100 MG tablet Take 1 tablet (100 mg total) by mouth 2 (two) times daily.  . simvastatin (ZOCOR) 40 MG tablet Take 1 tablet (40 mg total) by mouth daily.  Marland Kitchen triamcinolone cream (KENALOG) 0.1 % Apply to arms and legs bid for itching / rash  . umeclidinium-vilanterol (ANORO ELLIPTA) 62.5-25 MCG/INH AEPB Inhale 1 puff into the lungs daily.  Marland Kitchen warfarin (COUMADIN) 5 MG tablet Take 1 tablet (5 mg total) by mouth daily. Or as directed by anticoagulation clinic   No facility-administered encounter medications on file as of 10/19/2016.       Review of Systems  Constitutional: Positive for fatigue.  HENT: Negative.   Eyes: Negative.   Respiratory: Positive for shortness of breath.   Cardiovascular: Positive for leg swelling.  Gastrointestinal: Negative.   Endocrine: Negative.   Genitourinary: Negative.   Musculoskeletal: Negative.   Skin: Negative.   Allergic/Immunologic: Negative.   Neurological: Negative.   Hematological:  Negative.   Psychiatric/Behavioral: The patient is nervous/anxious.        Objective:   Physical Exam  Constitutional: He is oriented to person, place, and time. He appears well-developed and well-nourished. No distress.  HENT:  Head: Normocephalic and atraumatic.  Right Ear: External ear normal.  Left Ear: External ear normal.  Nose: Nose normal.  Mouth/Throat: Oropharynx is clear and moist. No oropharyngeal exudate.  Eyes: Conjunctivae and EOM are normal. Pupils are equal, round, and reactive to light. Right eye exhibits no discharge. Left eye exhibits no discharge. No scleral icterus.  Neck: Normal range of motion. Neck supple. No thyromegaly present.  Cardiovascular: Normal rate, regular rhythm, normal heart sounds and intact distal pulses.  Exam reveals no gallop and no friction rub.   No murmur heard. The heart was fairly regular at 84/m  Pulmonary/Chest: Effort normal. No respiratory distress. He has no wheezes. He has no rales.  Diminished breast sounds bilaterally without rales or wheezes  Abdominal: Soft. Bowel sounds are normal. He exhibits no mass. There is no tenderness. There is no rebound and no guarding.  Morbid obesity without masses tenderness or organ enlargement  Musculoskeletal: Normal range of motion. He exhibits edema. He exhibits no tenderness.  1+ pedal and pretibial edema  Lymphadenopathy:    He has no cervical adenopathy.  Neurological: He is alert and oriented to person, place, and time.  Skin: Skin is warm and dry. No rash noted.  Psychiatric: He has a normal mood and affect. His behavior is normal. Judgment and thought content normal.  Nursing note and vitals reviewed.  BP 121/81 (BP Location: Left Arm)   Pulse 87   Temp 97.2 F (36.2 C) (Oral)   Ht _0  (1.803 m)   Wt 289 lb (131.1 kg)   SpO2 92% Comment: 3 liter  BMI 40.31 kg/m   Chest x-ray with results pending     Assessment & Plan:  1. SOB (shortness of breath) -Continue with O2 at 3  L/m -Take an additional 40 mg of Lasix today only and after today take 40 in the morning and 20 in the afternoon - BMP8+EGFR - CBC with Differential/Platelet - DG Chest 2 View; Future  2. Edema, unspecified type -Additional Lasix as directed - BMP8+EGFR - CBC with Differential/Platelet - DG Chest 2 View; Future  3. Other fatigue - BMP8+EGFR - CBC with Differential/Platelet - DG Chest 2 View; Future  4. Atrial fibrillation, unspecified type ([place]) -The heart was slightly irregular today at 84/m - CoaguChek XS/INR Waived  5. Chronic obstructive pulmonary disease, unspecified COPD type ([place]) -Continue with O2 is doing  6. Diabetes mellitus type 2, insulin dependent ([facility]) -Monitor blood sugars regularly  7. Morbid obesity ([place]) -Continue with sodium restriction and all efforts at weight loss if possible  8. Anxiety -Increase Xanax to 0.25 at bedtime and one half at breakfast and one half at lunch  No orders of the defined types were placed in this encounter.  Patient Instructions  We will arrange for a follow-up appointment with the cardiologist to get his advice on the shortness of breath and whether or not it could be associated with your heart Watch sodium intake Take an additional 40 mg of Lasix today and then after today take 20 mg every afternoon at about 4:00 in addition to the 40 that is normally taken in the morning We will call with the results of the lab work the BNP the chest x-ray and the BMP and CBC as soon as those results become available Continue with oxygen as doing Continue with Xanax at bedtime and take one half of the 0.2 5 in the morning and one half of the 0.25 at lunch for anxiety  Arrie Senate MD

## 2016-10-19 NOTE — Addendum Note (Signed)
Addended by: Michaela Corner on: 10/19/2016 05:54 PM   Modules accepted: Orders

## 2016-10-19 NOTE — Addendum Note (Signed)
Addended by: Zannie Cove on: 10/19/2016 12:39 PM   Modules accepted: Orders

## 2016-10-19 NOTE — Patient Instructions (Signed)
We will arrange for a follow-up appointment with the cardiologist to get his advice on the shortness of breath and whether or not it could be associated with your heart Watch sodium intake Take an additional 40 mg of Lasix today and then after today take 20 mg every afternoon at about 4:00 in addition to the 40 that is normally taken in the morning We will call with the results of the lab work the BNP the chest x-ray and the BMP and CBC as soon as those results become available Continue with oxygen as doing Continue with Xanax at bedtime and take one half of the 0.2 5 in the morning and one half of the 0.25 at lunch for anxiety

## 2016-10-20 LAB — BRAIN NATRIURETIC PEPTIDE: BNP: 107.5 pg/mL — AB (ref 0.0–100.0)

## 2016-10-21 ENCOUNTER — Encounter: Payer: Self-pay | Admitting: Pharmacist Clinician (PhC)/ Clinical Pharmacy Specialist

## 2016-10-23 DIAGNOSIS — I5040 Unspecified combined systolic (congestive) and diastolic (congestive) heart failure: Secondary | ICD-10-CM | POA: Diagnosis not present

## 2016-10-23 DIAGNOSIS — J441 Chronic obstructive pulmonary disease with (acute) exacerbation: Secondary | ICD-10-CM | POA: Diagnosis not present

## 2016-10-23 NOTE — Progress Notes (Deleted)
HPI Patient presents for routine followup atrial fib. He was hospitalized in Dec with hypoglycemia and respiratory failure. He was treated for pneumonia and probable acute on chronic heart failure with an EF on echo of 50%.  He was diuresed 18 liters.  He had atrial fib that was treated with metoprolol.  Post discharge he had problems with memory.  He was having decreased O2 sats at the last appt.  He was not good about always wearing his O2.  ***     Since discharge from the hospital his wife has reported that he's not been himself. He is more forgetful. He's had a neurology evaluation. He is going to have an MRI. His weights have been relatively stable. She says he has not been taking extra diuretic.  He was very SOB after being brought back to the exam room today but he did not wear his oxygen.  After placing 2 liters on him he came up to 97%.  It is not clear why he did not wear his O2.  He's not been having any chest pressure, neck or arm discomfort. His lower externally swelling is much less than it was prior to admission.  Allergies  Allergen Reactions  . Penicillins     Unknown reaction  . Avapro [Irbesartan] Other (See Comments)    "doesnt sit right with me"--light headed  . Lipitor [Atorvastatin Calcium] Other (See Comments)    Leg pain    Current Outpatient Prescriptions  Medication Sig Dispense Refill  . acetaminophen (TYLENOL) 500 MG tablet Take 1,000 mg by mouth every 6 (six) hours as needed for headache. Reported on 10/08/2015    . albuterol (PROAIR HFA) 108 (90 Base) MCG/ACT inhaler Inhale 2 puffs every 4-6 hours -rescue inhaler 8.5 g 12  . ALPRAZolam (XANAX) 0.25 MG tablet Take 1 tablet (0.25 mg total) by mouth at bedtime. Also take 1/2 in the morning and 1/2 at lunch. 60 tablet 1  . Blood Glucose Calibration (TAI DOC CONTROL) NORMAL SOLN Use as directed.    . Blood Glucose Monitoring Suppl (CLEVER CHEK AUTO-CODE VOICE) DEVI USE AS DIRECTED 1 each 2  . Cetirizine HCl 10 MG  CAPS Take 1 capsule (10 mg total) by mouth at bedtime as needed. For itching    . Cholecalciferol (VITAMIN D3) 5000 UNITS TABS Take 1 tablet by mouth every morning.    . furosemide (LASIX) 40 MG tablet Take 1 tablet (40 mg total) by mouth 2 (two) times daily. As directed 60 tablet 6  . guaiFENesin (MUCINEX) 600 MG 12 hr tablet Take 600 mg by mouth 2 (two) times daily as needed for cough or to loosen phlegm.    . insulin NPH Human (NOVOLIN N) 100 UNIT/ML injection Inject 0.5 mLs (50 Units total) into the skin daily before breakfast. 20 mL 1  . insulin regular (NOVOLIN R,HUMULIN R) 250 units/2.4m (100 units/mL) injection 6 units prior to each meal 10 mL 1  . ipratropium-albuterol (DUONEB) 0.5-2.5 (3) MG/3ML SOLN PLACE 1 VIAL (3 MLS) IN THE NEBULIZER EVERY 6 HOURS AS NEEDED 1080 mL 0  . Lancet Devices (LANCING DEVICE) MISC USE AS DIRECTED 1 each 2  . metFORMIN (GLUCOPHAGE) 500 MG tablet TAKE ONE TABLET BY MOUTH IN THE MORNING AND TAKE TWO TABLETS IN THE EVENING 270 tablet 0  . metoprolol (LOPRESSOR) 100 MG tablet Take 1 tablet (100 mg total) by mouth 2 (two) times daily. 180 tablet 3  . simvastatin (ZOCOR) 40 MG tablet Take 1 tablet (40  mg total) by mouth daily. 90 tablet 3  . triamcinolone cream (KENALOG) 0.1 % Apply to arms and legs bid for itching / rash 80 g 0  . umeclidinium-vilanterol (ANORO ELLIPTA) 62.5-25 MCG/INH AEPB Inhale 1 puff into the lungs daily. 1 each 12  . warfarin (COUMADIN) 5 MG tablet Take 1 tablet (5 mg total) by mouth daily. Or as directed by anticoagulation clinic 90 tablet 0   No current facility-administered medications for this visit.     Past Medical History:  Diagnosis Date  . Arthritis   . Asthma   . BPH (benign prostatic hypertrophy)   . Colon polyps   . COPD (chronic obstructive pulmonary disease) ([place])   . Diabetes mellitus   . GERD (gastroesophageal reflux disease)   . History of oxygen administration    occ. oxygen use at 2 l/m as needed- not using at  this time  . HTN (hypertension)    x 3 years  . Hyperlipidemia   . Lung nodules 03/11/2013  . Mediastinal adenopathy 06/20/2013   CT  & PET 2/15   . Memory loss   . 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 NHL (non-Hodgkin's lymphoma) ([place])    nhl dx 9/11  . Obesity    exogenous  . Shingles   . Sleep apnea    recent study 04-07-14 awaiting results, no cpap yet.-Dr. Glynn Octave    Past Surgical History:  Procedure Laterality Date  . APPENDECTOMY    . COLONOSCOPY WITH PROPOFOL N/A 05/05/2014   Procedure: COLONOSCOPY WITH PROPOFOL;  Surgeon: Inda Castle, MD;  Location: [facility];  Service: Endoscopy;  Laterality: N/A;    ROS:  *** As stated in the HPI and negative for all other systems.  PHYSICAL EXAM There were no vitals taken for this visit.  GENERAL:  Well appearing NECK:  No jugular venous distention, waveform within normal limits, carotid upstroke brisk and symmetric, no bruits, no thyromegaly LUNGS:  Clear to auscultation bilaterally BACK:  No CVA tenderness CHEST:  Unremarkable HEART:  PMI not displaced or sustained,S1 and S2 within normal limits, no S3, no S4, no clicks, no rubs, *** murmurs ABD:  Flat, positive bowel sounds normal in frequency in pitch, no bruits, no rebound, no guarding, no midline pulsatile mass, no hepatomegaly, no splenomegaly EXT:  2 plus pulses throughout, no edema, no cyanosis no clubbing   GENERAL:  Chronically ill appearing NECK:  No jugular venous distention, waveform within normal limits, carotid upstroke brisk and symmetric, no bruits, no thyromegaly LUNGS:  Clear to auscultation bilaterally CHEST:  Unremarkable HEART:  PMI not displaced or sustained,S1 and S2 within normal limits, no S3, no clicks, no rubs, no murmurs, irregular ABD:  Flat, positive bowel sounds normal in frequency in pitch, no bruits, no rebound, no guarding, no midline pulsatile mass, no hepatomegaly, no splenomegaly, obese EXT:  2 plus  pulses throughout, no edema, no cyanosis no clubbing,  SKIN:  Excoriated skin lesions.   EKG:  Atrial fibrillation rate ***, axis within normal limits, intervals within normal limits, diffuse T wave flattening.  10/23/2016  Lab Results  Component Value Date   HGBA1C 5.9 (H) 03/28/2016     ASSESSMENT AND PLAN  FIBRILLATION, ATRIAL -  He has no symptomatic paroxysms. He tolerates anticoagulation. The patient is not interested in switching to a novel oral anticoagulant.   Justin Ruiz has a CHA2DS2 - VASc score of 2 with a risk of stroke of 2.2%.  ***  No change in therapy is planned  ACUTE ON CHRONIC DIASTOLIC HF -  ***  We talked about when necessary dosing of his diuretic. For now he'll remain on the meds as listed. I have given instructions for a CBC and basic metabolic profile.   HTN -  ***  The blood pressure is at target. No change in medications is indicated. We will continue with therapeutic lifestyle changes (TLC).  OBESITY, UNSPECIFIED -   ***The patient understands the need to lose weight with diet and exercise.   DM - His A1c was 5.9.  *** This is improved.  Continue current therapy.    DYSPNEA - ***   This seems to be multifactorial. His oxygen sats were good on oxygen and I encouraged him to use this. I don't think he's volume overloaded at this point. We talked about weight management, daily weights and when necessary diuretics.

## 2016-10-24 ENCOUNTER — Ambulatory Visit: Payer: Medicare HMO | Admitting: Cardiology

## 2016-10-31 ENCOUNTER — Telehealth: Payer: Self-pay | Admitting: Family Medicine

## 2016-11-01 NOTE — Telephone Encounter (Signed)
Wife aware that patient will need to be seen.  Offered appt to see another provider.  Patient only wants to see Dr. Laurance Flatten Vernie Shanks appt 7/17

## 2016-11-01 NOTE — Telephone Encounter (Signed)
Will have to be seen

## 2016-11-01 NOTE — Telephone Encounter (Signed)
Please review and advise.

## 2016-11-02 ENCOUNTER — Ambulatory Visit (INDEPENDENT_AMBULATORY_CARE_PROVIDER_SITE_OTHER): Payer: Medicare HMO | Admitting: Pharmacist

## 2016-11-02 DIAGNOSIS — Z7901 Long term (current) use of anticoagulants: Secondary | ICD-10-CM | POA: Diagnosis not present

## 2016-11-02 DIAGNOSIS — I4821 Permanent atrial fibrillation: Secondary | ICD-10-CM

## 2016-11-02 DIAGNOSIS — I482 Chronic atrial fibrillation: Secondary | ICD-10-CM

## 2016-11-02 LAB — COAGUCHEK XS/INR WAIVED
INR: 1.3 — AB (ref 0.9–1.1)
PROTHROMBIN TIME: 15.5 s

## 2016-11-02 NOTE — Patient Instructions (Signed)
Anticoagulation Warfarin Dose Instructions as of 11/02/2016      Justin Ruiz Tue Wed Thu Fri Sat   New Dose    7.5 mg  1 and 1/2 tablets 7.5 mg  1 and 1/2 tablets 2.5 mg  1/2 tablet 5 mg  1 tablet   Alt Week 5 mg  1 tablet  2.5 mg  1/2 tablet  5 mg  1 tablet 5 mg  1 tablet 5 mg  1 tablet 2.5 mg  1/2 tablet 5 mg  1 tablet    Description   Take 1 and 1/2 tablets for next 2 days (11/02/2016 and 11/03/2016).  Then increase warfarin to 1/2 tablet mondays and fridays.  Take 1 tablet all other days.  INR was 1.3 today (Goal: 2-3)

## 2016-11-04 DIAGNOSIS — M6281 Muscle weakness (generalized): Secondary | ICD-10-CM | POA: Diagnosis not present

## 2016-11-04 DIAGNOSIS — R531 Weakness: Secondary | ICD-10-CM | POA: Diagnosis not present

## 2016-11-04 DIAGNOSIS — I5040 Unspecified combined systolic (congestive) and diastolic (congestive) heart failure: Secondary | ICD-10-CM | POA: Diagnosis not present

## 2016-11-04 DIAGNOSIS — J441 Chronic obstructive pulmonary disease with (acute) exacerbation: Secondary | ICD-10-CM | POA: Diagnosis not present

## 2016-11-04 DIAGNOSIS — J449 Chronic obstructive pulmonary disease, unspecified: Secondary | ICD-10-CM | POA: Diagnosis not present

## 2016-11-08 ENCOUNTER — Encounter: Payer: Self-pay | Admitting: Family Medicine

## 2016-11-08 ENCOUNTER — Ambulatory Visit (INDEPENDENT_AMBULATORY_CARE_PROVIDER_SITE_OTHER): Payer: Medicare HMO | Admitting: Family Medicine

## 2016-11-08 VITALS — BP 101/68 | HR 69 | Temp 97.1°F | Ht 71.0 in | Wt 287.0 lb

## 2016-11-08 DIAGNOSIS — J9611 Chronic respiratory failure with hypoxia: Secondary | ICD-10-CM | POA: Diagnosis not present

## 2016-11-08 DIAGNOSIS — Z794 Long term (current) use of insulin: Secondary | ICD-10-CM | POA: Diagnosis not present

## 2016-11-08 DIAGNOSIS — L853 Xerosis cutis: Secondary | ICD-10-CM

## 2016-11-08 DIAGNOSIS — J439 Emphysema, unspecified: Secondary | ICD-10-CM | POA: Diagnosis not present

## 2016-11-08 DIAGNOSIS — I5032 Chronic diastolic (congestive) heart failure: Secondary | ICD-10-CM

## 2016-11-08 DIAGNOSIS — J449 Chronic obstructive pulmonary disease, unspecified: Secondary | ICD-10-CM

## 2016-11-08 DIAGNOSIS — I482 Chronic atrial fibrillation: Secondary | ICD-10-CM

## 2016-11-08 DIAGNOSIS — C8590 Non-Hodgkin lymphoma, unspecified, unspecified site: Secondary | ICD-10-CM

## 2016-11-08 DIAGNOSIS — R0602 Shortness of breath: Secondary | ICD-10-CM

## 2016-11-08 DIAGNOSIS — E119 Type 2 diabetes mellitus without complications: Secondary | ICD-10-CM | POA: Diagnosis not present

## 2016-11-08 DIAGNOSIS — I4821 Permanent atrial fibrillation: Secondary | ICD-10-CM

## 2016-11-08 DIAGNOSIS — F419 Anxiety disorder, unspecified: Secondary | ICD-10-CM | POA: Diagnosis not present

## 2016-11-08 MED ORDER — PAROXETINE HCL 10 MG PO TABS: 10.0000 mg | ORAL_TABLET | Freq: Every day | ORAL | 0 refills | Status: DC

## 2016-11-08 MED ORDER — CEPHALEXIN 500 MG PO CAPS: 500.0000 mg | ORAL_CAPSULE | Freq: Two times a day (BID) | ORAL | 0 refills | Status: DC

## 2016-11-08 MED ORDER — ALPRAZOLAM 0.25 MG PO TABS: 0.2500 mg | ORAL_TABLET | Freq: Three times a day (TID) | ORAL | 1 refills | Status: DC | PRN

## 2016-11-08 NOTE — Patient Instructions (Signed)
Continue to use oxygen regularly Continue to use CPAP Continue to follow-up with cardiology and pulmonology and hematology/oncology. You may increase on an official basis the Xanax to 1   3 times daily. We will give you a mild antidepressant medicine called Paxil he will take this at bedtime.

## 2016-11-08 NOTE — Progress Notes (Signed)
Subjective:    Patient ID: Justin Ruiz, male    DOB: 02/05/1951, 66 y.o.   MRN: [id number]  HPI Patient here today for 2-3 week follow up on fatigue, edema and SOB. According to the patient's wife who comes with him to the visit today, he is been having a lot of panic attacks and has to call a lot of people during the day because of his anxiety. He is on 3 L of O2 and his oxygen level drops into the 80s if he takes it off for any period of time. The patient does have shortness of breath edema of fatigue and weakness atrial fib and COPD. He is followed regularly by the hematologist/oncologist, the cardiologist, and the pulmonologist. The patient comes to the visit today with his wife. The biggest issues that they're dealing with are his anxiety issues. There've active and using Xanax 0.253 times daily. This seems to help as long as he can take it regularly and if this strength. We will also add a low-dose antidepressant like Paxil to this regimen. His wife will call back in about 3 weeks to let us know his progress in this matter. The patient today denies any chest pain. He has his ongoing shortness of breath but continues to use oxygen at 3 L/m. He is morbidly obese. He's having no trouble with his intestinal tract including nausea vomiting diarrhea blood in the stool or black tarry bowel movements. He is passing his water without problems. He does complain of his skin on his extremities being itchy. We did discuss at the same time the fact that they should be using scent free fabric softeners detergents and soaps and the wife plans to implement this. His skin is very dry. They will also pick up some Eucerin as a moisturizer and they will use this more regularly in addition to the changes in soaps fabric softeners and detergents. There are excoriations on his back and his lower extremities.  Patient Active Problem List   Diagnosis Date Noted  . Memory loss 04/13/2016  . Hypomagnesemia   . Morbid  obesity (HCC) 03/29/2016  . Hypoglycemia 03/28/2016  . Acute encephalopathy 03/28/2016  . Acute diastolic heart failure (HCC) 03/28/2016  . COPD mixed type (HCC) 03/28/2016  . Atrial fibrillation with RVR (HCC) 03/28/2016  . Acute on chronic respiratory failure (HCC) 03/28/2016  . 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 Chronic diastolic heart failure (HCC)   . CAP (community acquired pneumonia)   . Acute-on-chronic respiratory failure (HCC) 06/07/2015  . Sepsis (HCC) 06/07/2015  . Diabetic neuropathy, type II diabetes mellitus (HCC) 09/25/2014  . Chronic respiratory failure with hypoxia (HCC) 05/25/2014  . Obstructive sleep apnea 05/25/2014  . Benign neoplasm of ascending colon 05/05/2014  . Excessive daytime sleepiness 01/24/2014  . Mediastinal adenopathy 06/20/2013  . Severe obesity (BMI >= 40) (HCC) 05/01/2013  . Generalized anxiety disorder 05/01/2013  . Lung nodules 03/11/2013  . Chronic anticoagulation 08/20/2012  . FIBRILLATION, ATRIAL 05/12/2010  . NHL (non-Hodgkin's lymphoma) (HCC) 01/26/2010  . Obesity, morbid (HCC) 04/27/2009  . CARDIOVASCULAR STUDIES, ABNORMAL 04/27/2009  . ABNORMAL STRESS ELECTROCARDIOGRAM 03/25/2009  . PERSONAL HISTORY OF COLONIC POLYPS 03/02/2009  . Diabetes mellitus type 2, insulin dependent (HCC) 04/29/2007  . Hyperlipidemia 04/29/2007  . Hypertension 04/29/2007  . Seasonal and perennial allergic rhinitis 04/29/2007  . COPD exacerbation (HCC) 04/29/2007  . G E R D 04/29/2007  .  BENIGN PROSTATIC HYPERTROPHY, HX OF 04/29/2007   Outpatient Encounter Prescriptions as of 11/08/2016  Medication Sig  . acetaminophen (TYLENOL) 500 MG tablet Take 1,000 mg by mouth every 6 (six) hours as needed for headache. Reported on 10/08/2015  . albuterol (PROAIR HFA) 108 (90 Base) MCG/ACT inhaler Inhale 2 puffs every 4-6 hours -rescue inhaler  . ALPRAZolam  (XANAX) 0.25 MG tablet Take 1 tablet (0.25 mg total) by mouth at bedtime. Also take 1/2 in the morning and 1/2 at lunch.  . Blood Glucose Calibration (TAI DOC CONTROL) NORMAL SOLN Use as directed.  . Blood Glucose Monitoring Suppl (CLEVER CHEK AUTO-CODE VOICE) DEVI USE AS DIRECTED  . Cetirizine HCl 10 MG CAPS Take 1 capsule (10 mg total) by mouth at bedtime as needed. For itching  . Cholecalciferol (VITAMIN D3) 5000 UNITS TABS Take 1 tablet by mouth every morning.  . furosemide (LASIX) 40 MG tablet Take 1 tablet (40 mg total) by mouth 2 (two) times daily. As directed  . guaiFENesin (MUCINEX) 600 MG 12 hr tablet Take 600 mg by mouth 2 (two) times daily as needed for cough or to loosen phlegm.  . insulin NPH Human (NOVOLIN N) 100 UNIT/ML injection Inject 0.5 mLs (50 Units total) into the skin daily before breakfast.  . insulin regular (NOVOLIN R,HUMULIN R) 250 units/2.85m (100 units/mL) injection 6 units prior to each meal  . ipratropium-albuterol (DUONEB) 0.5-2.5 (3) MG/3ML SOLN PLACE 1 VIAL (3 MLS) IN THE NEBULIZER EVERY 6 HOURS AS NEEDED  . Lancet Devices (LANCING DEVICE) MISC USE AS DIRECTED  . metFORMIN (GLUCOPHAGE) 500 MG tablet TAKE ONE TABLET BY MOUTH IN THE MORNING AND TAKE TWO TABLETS IN THE EVENING  . metoprolol (LOPRESSOR) 100 MG tablet Take 1 tablet (100 mg total) by mouth 2 (two) times daily.  . simvastatin (ZOCOR) 40 MG tablet Take 1 tablet (40 mg total) by mouth daily.  .Marland Kitchentriamcinolone cream (KENALOG) 0.1 % Apply to arms and legs bid for itching / rash  . warfarin (COUMADIN) 5 MG tablet Take 1 tablet (5 mg total) by mouth daily. Or as directed by anticoagulation clinic  . [DISCONTINUED] umeclidinium-vilanterol (ANORO ELLIPTA) 62.5-25 MCG/INH AEPB Inhale 1 puff into the lungs daily.   No facility-administered encounter medications on file as of 11/08/2016.       Review of Systems  Constitutional: Positive for fatigue.  HENT: Negative.   Eyes: Negative.   Respiratory: Positive  for shortness of breath.   Cardiovascular: Negative.   Gastrointestinal: Negative.   Endocrine: Negative.   Genitourinary: Negative.   Musculoskeletal: Negative.   Skin: Negative.   Allergic/Immunologic: Negative.   Neurological: Negative.   Hematological: Negative.   Psychiatric/Behavioral: The patient is nervous/anxious.        Objective:   Physical Exam  Constitutional: He is oriented to person, place, and time. He appears well-developed and well-nourished. No distress.  The patient was pleasant and calm her and less stressed than when I had seen him recently.  HENT:  Head: Normocephalic and atraumatic.  Right Ear: External ear normal.  Left Ear: External ear normal.  Nose: Nose normal.  Mouth/Throat: Oropharynx is clear and moist. No oropharyngeal exudate.  Eyes: Pupils are equal, round, and reactive to light. Conjunctivae and EOM are normal. Right eye exhibits no discharge. Left eye exhibits no discharge. No scleral icterus.  Neck: Normal range of motion. Neck supple. No thyromegaly present.  Cardiovascular: Normal rate, regular rhythm, normal heart sounds and intact distal pulses.   No murmur heard.  Heart had a regular rate and rhythm at 72/m  Pulmonary/Chest: Effort normal. No respiratory distress. He has no wheezes. He has no rales. He exhibits no tenderness.  Diminished breath sounds bilaterally without wheezes or rales or rhonchi.  Abdominal: Soft. Bowel sounds are normal. He exhibits no mass. There is no tenderness. There is no rebound and no guarding.  Abdominal obesity without masses tenderness or organ enlargement  Musculoskeletal: Normal range of motion. He exhibits no edema.  Lymphadenopathy:    He has no cervical adenopathy.  Neurological: He is alert and oriented to person, place, and time.  Skin: Skin is warm and dry. Rash noted. There is erythema. No pallor.  Excoriations on both lower extremities with mild redness around some of the excoriations. Also  excoriations on the back and very dry skin.  Psychiatric: He has a normal mood and affect. His behavior is normal. Judgment and thought content normal.  Nursing note and vitals reviewed.  BP 101/68 (BP Location: Left Arm)   Pulse 69   Temp (!) 97.1 F (36.2 C) (Oral)   Ht _0  (1.803 m)   Wt 287 lb (130.2 kg)   SpO2 97% Comment: on 3 literes of O2  BMI 40.03 kg/m         Assessment & Plan:  1. Permanent atrial fibrillation (HCC) -The patient was actually in normal sinus rhythm today so he will continue with his Coumadin.  2. SOB (shortness of breath) -This appears to be stable as far as he is using his O2 at 3 L/m  3. Morbid obesity ([place]) -He will continue to work aggressively on his weight through diet and exercise as much as possible  4. Chronic obstructive pulmonary disease, unspecified COPD type ([place]) -Continue with O2 and current breathing treatments  5. Anxiety -Increase and neck to 0.253 times daily and add Paxil 10 mg at bedtime  6. Chronic diastolic heart failure ([place] [place]) -Follow-up with cardiology as planned  7. Type 2 diabetes mellitus treated with insulin ([place]) -Continue current treatment regimen per direction of clinical pharmacy and diabetic educator  8. Morbid obesity due to excess calories (HCC) -Aggressive therapeutic lifestyle changes  9. Xerosis of skin -Use scent free fabric softener soaps and detergents.  10. Non-Hodgkin's lymphoma -Follow-up with oncology as planned  Meds ordered this encounter  Medications  . PARoxetine (PAXIL) 10 MG tablet    Sig: Take 1 tablet (10 mg total) by mouth daily.    Dispense:  90 tablet    Refill:  0  . DISCONTD: ALPRAZolam (XANAX) 0.25 MG tablet    Sig: Take 1 tablet (0.25 mg total) by mouth 3 (three) times daily as needed for anxiety. Also take 1/2 in the morning and 1/2 at lunch.    Dispense:  90 tablet    Refill:  1  . ALPRAZolam (XANAX) 0.25 MG tablet    Sig: Take 1 tablet (0.25 mg total) by mouth 3  (three) times daily as needed for anxiety.    Dispense:  90 tablet    Refill:  1  . cephALEXin (KEFLEX) 500 MG capsule    Sig: Take 1 capsule (500 mg total) by mouth 2 (two) times daily.    Dispense:  20 capsule    Refill:  0   Patient Instructions  Continue to use oxygen regularly Continue to use CPAP Continue to follow-up with cardiology and pulmonology and hematology/oncology. You may increase on an official basis the Xanax to 1   3 times daily. We  will give you a mild antidepressant medicine called Paxil he will take this at bedtime.   Arrie Senate MD

## 2016-11-09 ENCOUNTER — Ambulatory Visit: Payer: Self-pay | Admitting: Pharmacist

## 2016-11-15 ENCOUNTER — Telehealth: Payer: Self-pay | Admitting: Internal Medicine

## 2016-11-15 NOTE — Telephone Encounter (Signed)
Spoke with patient's wife;states patient is having low O2 levels while at 2lpm. Pt told her it feels like he can not get enough air and has added a face mask to his nasal cannula to help breathe. Pt has increased his O2 to 3-3.5 lpm to help but feels concerned with what is going on.   Pt is weak,fatigued,panic attacks as well. Already on fluid pill and nerve pill but continues to have issues with breathing.   Pt's wife will take patient to closest ED to seek treatment as pt has not been sick/"cold" recently and continues to have SOB.  [facility] is aware.

## 2016-11-19 ENCOUNTER — Other Ambulatory Visit: Payer: Self-pay | Admitting: Family Medicine

## 2016-11-22 ENCOUNTER — Encounter: Payer: Self-pay | Admitting: Family Medicine

## 2016-11-22 ENCOUNTER — Ambulatory Visit (INDEPENDENT_AMBULATORY_CARE_PROVIDER_SITE_OTHER): Payer: Medicare HMO | Admitting: Family Medicine

## 2016-11-22 VITALS — BP 102/69 | HR 70 | Temp 97.3°F | Ht 71.0 in | Wt 288.0 lb

## 2016-11-22 DIAGNOSIS — F419 Anxiety disorder, unspecified: Secondary | ICD-10-CM | POA: Diagnosis not present

## 2016-11-22 DIAGNOSIS — E7800 Pure hypercholesterolemia, unspecified: Secondary | ICD-10-CM

## 2016-11-22 DIAGNOSIS — C8590 Non-Hodgkin lymphoma, unspecified, unspecified site: Secondary | ICD-10-CM

## 2016-11-22 DIAGNOSIS — N400 Enlarged prostate without lower urinary tract symptoms: Secondary | ICD-10-CM

## 2016-11-22 DIAGNOSIS — I10 Essential (primary) hypertension: Secondary | ICD-10-CM

## 2016-11-22 DIAGNOSIS — J9611 Chronic respiratory failure with hypoxia: Secondary | ICD-10-CM | POA: Diagnosis not present

## 2016-11-22 DIAGNOSIS — E119 Type 2 diabetes mellitus without complications: Secondary | ICD-10-CM | POA: Diagnosis not present

## 2016-11-22 DIAGNOSIS — E559 Vitamin D deficiency, unspecified: Secondary | ICD-10-CM

## 2016-11-22 DIAGNOSIS — J449 Chronic obstructive pulmonary disease, unspecified: Secondary | ICD-10-CM | POA: Diagnosis not present

## 2016-11-22 DIAGNOSIS — F329 Major depressive disorder, single episode, unspecified: Secondary | ICD-10-CM

## 2016-11-22 DIAGNOSIS — Z794 Long term (current) use of insulin: Secondary | ICD-10-CM | POA: Diagnosis not present

## 2016-11-22 DIAGNOSIS — I4821 Permanent atrial fibrillation: Secondary | ICD-10-CM

## 2016-11-22 DIAGNOSIS — I482 Chronic atrial fibrillation: Secondary | ICD-10-CM | POA: Diagnosis not present

## 2016-11-22 LAB — URINALYSIS, COMPLETE
BILIRUBIN UA: NEGATIVE
Ketones, UA: NEGATIVE
Nitrite, UA: NEGATIVE
PH UA: 6.5 (ref 5.0–7.5)
Protein, UA: NEGATIVE
Specific Gravity, UA: 1.015 (ref 1.005–1.030)
UUROB: 0.2 mg/dL (ref 0.2–1.0)

## 2016-11-22 LAB — MICROSCOPIC EXAMINATION
Bacteria, UA: NONE SEEN
RENAL EPITHEL UA: NONE SEEN /HPF

## 2016-11-22 LAB — BAYER DCA HB A1C WAIVED: HB A1C (BAYER DCA - WAIVED): 8.1 % — ABNORMAL HIGH (ref <7.0)

## 2016-11-22 MED ORDER — PAROXETINE HCL 10 MG PO TABS: 10.0000 mg | ORAL_TABLET | Freq: Every day | ORAL | 0 refills | Status: DC

## 2016-11-22 NOTE — Patient Instructions (Addendum)
Medicare Annual Wellness Visit  [facility] and the medical providers at [facility] strive to bring you the best medical care.  In doing so we not only want to address your current medical conditions and concerns but also to detect new conditions early and prevent illness, disease and health-related problems.    Medicare offers a yearly Wellness Visit which allows our clinical staff to assess your need for preventative services including immunizations, lifestyle education, counseling to decrease risk of preventable diseases and screening for fall risk and other medical concerns.    This visit is provided free of charge (no copay) for all Medicare recipients. The clinical pharmacists at [facility] have begun to conduct these Wellness Visits which will also include a thorough review of all your medications.    As you primary medical provider recommend that you make an appointment for your Annual Wellness Visit if you have not done so already this year.  You may set up this appointment before you leave today or you may call back ([phone]) and schedule an appointment.  Please make sure when you call that you mention that you are scheduling your Annual Wellness Visit with the clinical pharmacist so that the appointment may be made for the proper length of time.      Continue current medications. Continue good therapeutic lifestyle changes which include good diet and exercise. Fall precautions discussed with patient. If an FOBT was given today- please return it to our front desk. If you are over 32 years old - you may need Prevnar 69 or the adult Pneumonia vaccine.  **Flu shots are available--- please call and schedule a FLU-CLINIC appointment**  After your visit with [place] today you will receive a survey in the mail or online from [employer] regarding your care with [place]. Please take a moment to fill this out. Your feedback is very  important to [place] as you can help [place] better understand your patient needs as well as improve your experience and satisfaction. WE CARE ABOUT YOU!!!   The patient will call in a couple weeks regarding his depression and anxiety and we will increase the Paxil if he still feels like he can get more benefit and we will continue to ask him to try to reduce the Xanax gradually. He should stay active as physically as possible while continuing to use his oxygen on a regular basis at 3 L/m. He should make every effort to lose as much weight as possible with diet and exercise

## 2016-11-22 NOTE — Progress Notes (Signed)
Subjective:    Patient ID: Justin Ruiz, male    DOB: 07/20/1950, 66 y.o.   MRN: [id number]  HPI Pt here for follow up and management of chronic medical problems which includes a fib, diabetes, hyperlipidemia and hypertension. He is taking medication regularly.The patient may be doing some better with his anxiety. He is requesting a refill on the Paxil. His pulse ox today was 89% on 3 L of oxygen. The patient has morbid obesity chronic diastolic heart failure chronic respiratory failure and B-cell lymphoma. He also has diabetes with neuropathy. He is had a difficult year. He is bringing blood sugars in for review and they are in the 200 and per 190 range with a rare reading up to 372. There was one low reading down to 99. The patient comes to the visit today with his wife. He seems to be doing a lot better with the Paxil 10 mg. He is smiling and has a more positive attitude and his wife says he's not is anxious and not calling people as much because of his phobias. He denies any chest pain. He denies any more shortness of breath than usual. He denies any trouble with his stomach including nausea vomiting diarrhea blood in the stool or black tarry bowel movements. He is passing his water without problems. The patient's wife will call us in a couple weeks and if he feels like he needs more antidepressant relief we will increase his Paxil to 20 mg at that time. At the same time we've also told them if they can reduce the Xanax and periodically take half a one episode of a whole one of the severe good plan to follow.    Patient Active Problem List   Diagnosis Date Noted  . Memory loss 04/13/2016  . Hypomagnesemia   . Morbid obesity ([place]) 03/29/2016  . Hypoglycemia 03/28/2016  . Acute encephalopathy 03/28/2016  . Acute diastolic heart failure ([place]) 03/28/2016  . COPD mixed type ([place]) 03/28/2016  . Atrial fibrillation with RVR ([place]) 03/28/2016  . Acute on chronic respiratory failure ([place]) 03/28/2016   . 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 Chronic diastolic heart failure ([place])   . CAP (community acquired pneumonia)   . Acute-on-chronic respiratory failure ([place]) 06/07/2015  . Sepsis ([place]) 06/07/2015  . Diabetic neuropathy, type II diabetes mellitus ([employer]) 09/25/2014  . Chronic respiratory failure with hypoxia ([place]) 05/25/2014  . Obstructive sleep apnea 05/25/2014  . Benign neoplasm of ascending colon 05/05/2014  . Excessive daytime sleepiness 01/24/2014  . Mediastinal adenopathy 06/20/2013  . Severe obesity (BMI >= 40) ([place]) 05/01/2013  . Generalized anxiety disorder 05/01/2013  . Lung nodules 03/11/2013  . Chronic anticoagulation 08/20/2012  . FIBRILLATION, ATRIAL 05/12/2010  . NHL (non-Hodgkin's lymphoma) ([place]) 01/26/2010  . Obesity, morbid ([place]) 04/27/2009  . CARDIOVASCULAR STUDIES, ABNORMAL 04/27/2009  . ABNORMAL STRESS ELECTROCARDIOGRAM 03/25/2009  . PERSONAL HISTORY OF COLONIC POLYPS 03/02/2009  . Diabetes mellitus type 2, insulin dependent ([place]) 04/29/2007  . Hyperlipidemia 04/29/2007  . Hypertension 04/29/2007  . Seasonal and perennial allergic rhinitis 04/29/2007  . COPD exacerbation ([place]) 04/29/2007  . G E R D 04/29/2007  . BENIGN PROSTATIC HYPERTROPHY, HX OF 04/29/2007   Outpatient Encounter Prescriptions as of 11/22/2016  Medication Sig  . acetaminophen (TYLENOL) 500 MG tablet Take 1,000 mg by mouth every 6 (six) hours as needed for headache. Reported on 10/08/2015  . albuterol (PROAIR HFA)  108 (90 Base) MCG/ACT inhaler Inhale 2 puffs every 4-6 hours -rescue inhaler  . ALPRAZolam (XANAX) 0.25 MG tablet Take 1 tablet (0.25 mg total) by mouth 3 (three) times daily as needed for anxiety.  . Blood Glucose Calibration (TAI DOC CONTROL) NORMAL SOLN Use as directed.  . Blood Glucose Monitoring Suppl (CLEVER CHEK AUTO-CODE VOICE) DEVI USE AS DIRECTED  .  Cholecalciferol (VITAMIN D3) 5000 UNITS TABS Take 1 tablet by mouth every morning.  . furosemide (LASIX) 40 MG tablet Take 1 tablet (40 mg total) by mouth 2 (two) times daily. As directed  . guaiFENesin (MUCINEX) 600 MG 12 hr tablet Take 600 mg by mouth 2 (two) times daily as needed for cough or to loosen phlegm.  . insulin NPH Human (NOVOLIN N) 100 UNIT/ML injection Inject 0.5 mLs (50 Units total) into the skin daily before breakfast.  . insulin regular (NOVOLIN R,HUMULIN R) 250 units/2.104m (100 units/mL) injection 6 units prior to each meal  . ipratropium-albuterol (DUONEB) 0.5-2.5 (3) MG/3ML SOLN PLACE 1 VIAL (3 MLS) IN THE NEBULIZER EVERY 6 HOURS AS NEEDED  . Lancet Devices (LANCING DEVICE) MISC USE AS DIRECTED  . metFORMIN (GLUCOPHAGE) 500 MG tablet TAKE ONE TABLET BY MOUTH IN THE MORNING AND TAKE TWO TABLETS IN THE EVENING  . metFORMIN (GLUCOPHAGE) 500 MG tablet TAKE 1 TABLET BY MOUTH IN THE MORNING AND TAKE 2 TABLETS IN THE EVENING  . metoprolol (LOPRESSOR) 100 MG tablet Take 1 tablet (100 mg total) by mouth 2 (two) times daily.  .Marland KitchenPARoxetine (PAXIL) 10 MG tablet Take 1 tablet (10 mg total) by mouth daily.  . simvastatin (ZOCOR) 40 MG tablet Take 1 tablet (40 mg total) by mouth daily.  .Marland Kitchenwarfarin (COUMADIN) 5 MG tablet Take 1 tablet (5 mg total) by mouth daily. Or as directed by anticoagulation clinic  . [DISCONTINUED] cephALEXin (KEFLEX) 500 MG capsule Take 1 capsule (500 mg total) by mouth 2 (two) times daily.  . [DISCONTINUED] Cetirizine HCl 10 MG CAPS Take 1 capsule (10 mg total) by mouth at bedtime as needed. For itching  . [DISCONTINUED] triamcinolone cream (KENALOG) 0.1 % Apply to arms and legs bid for itching / rash   No facility-administered encounter medications on file as of 11/22/2016.       Review of Systems  Constitutional: Negative.   HENT: Negative.   Eyes: Negative.   Respiratory: Negative.   Cardiovascular: Negative.   Gastrointestinal: Negative.   Endocrine:  Negative.   Genitourinary: Negative.   Musculoskeletal: Negative.   Skin: Negative.   Allergic/Immunologic: Negative.   Neurological: Negative.   Hematological: Negative.   Psychiatric/Behavioral: Negative.  Nervous/anxious: better.        Objective:   Physical Exam  Constitutional: He is oriented to person, place, and time. He appears well-developed and well-nourished. No distress.  The patient is morbidly obese but in a better mood than when previously seen.  HENT:  Head: Normocephalic and atraumatic.  Right Ear: External ear normal.  Left Ear: External ear normal.  Nose: Nose normal.  Mouth/Throat: Oropharynx is clear and moist. No oropharyngeal exudate.  He is using nasal O2 at 3 L/m  Eyes: Pupils are equal, round, and reactive to light. Conjunctivae and EOM are normal. Right eye exhibits no discharge. Left eye exhibits no discharge. No scleral icterus.  Neck: Normal range of motion. Neck supple. No thyromegaly present.  No bruits or thyromegaly  Cardiovascular: Normal rate, regular rhythm, normal heart sounds and intact distal pulses.   No murmur  heard. Decreased pedal pulses right foot The heart is irregular irregular at 72/m  Pulmonary/Chest: Effort normal. No respiratory distress. He has wheezes. He has no rales. He exhibits no tenderness.  No axillary adenopathy Diminished breath sounds bilaterally with scattered wheezes  Abdominal: Soft. Bowel sounds are normal. He exhibits no mass. There is no tenderness. There is no rebound and no guarding.  Morbid obesity without masses tenderness or organ enlargement or bruits  Genitourinary: Rectum normal and penis normal.  Genitourinary Comments: The prostate is soft and enlarged and only the inferior portion of the prostate was palpable due to his buttock size and there were no rectal masses. The external genitalia were within normal limits and there was no inguinal adenopathy. No hernias were palpable.  Musculoskeletal: Normal  range of motion. He exhibits no edema or tenderness.  Lymphadenopathy:    He has no cervical adenopathy.  Neurological: He is alert and oriented to person, place, and time. He has normal reflexes. No cranial nerve deficit.  Skin: Skin is warm and dry. No rash noted. There is erythema.  The excoriations from the previous visit appear to be healing.  Psychiatric: He has a normal mood and affect. His behavior is normal. Judgment and thought content normal.  Paxil seems to be helping his depression.  Nursing note and vitals reviewed.  BP 102/69 (BP Location: Left Arm)   Pulse 70   Temp (!) 97.3 F (36.3 C) (Oral)   Ht 5' 11" (1.803 m)   Wt 288 lb (130.6 kg)   SpO2 (!) 89% Comment: on 3 liters  BMI 40.17 kg/m         Assessment & Plan:  1. Permanent atrial fibrillation (HCC) -The patient remains in atrial fibrillation and we'll continue to follow-up with cardiology - CBC with Differential/Platelet  2. Chronic obstructive pulmonary disease, unspecified COPD type ([place]) -The breathing seems to be stable and he will follow up with pulmonology as needed - CBC with Differential/Platelet  3. Type 2 diabetes mellitus treated with insulin (HCC) -The blood sugars appear to be lower recently and we will wait for the hemoglobin A1c to be returned to make any further adjustments with his medicine - CBC with Differential/Platelet - BMP8+EGFR - Bayer DCA Hb A1c Waived  4. Vitamin D deficiency -Continue vitamin D replacement pending results of lab work - CBC with Differential/Platelet - VITAMIN D 25 Hydroxy (Vit-D Deficiency, Fractures)  5. Pure hypercholesterolemia -Continue with aggressive therapeutic lifestyle changes including diet and exercise and current treatment pending results of lab work - CBC with Differential/Platelet - Lipid panel  6. Hypertension -Blood pressure is good today and he will continue with current treatment - CBC with Differential/Platelet - BMP8+EGFR -  Hepatic function panel  7. Benign prostatic hyperplasia, unspecified whether lower urinary tract symptoms present -The prostate is enlarged but only the inferior portion could be felt with a rectal exam. There are no lumps or masses in the patient's not having any symptoms with voiding. - PSA, total and free - Urinalysis, Complete  8. Morbid obesity due to excess calories ([place]) -Continue with aggressive therapeutic lifestyle changes to achieve weight loss  9. Non-Hodgkin's lymphoma in adult [facility]) -Follow-up with hematology oncology as planned  10. Chronic respiratory failure with hypoxia (HCC) -Follow-up with pulmonology as planned  11. Anxiety and depression -Continue with Paxil 10 mg and call in a couple weeks and we will consider increasing this to 20 mg. In the meantime he will try to gradually begin to reduce  his Xanax.  Meds ordered this encounter  Medications  . PARoxetine (PAXIL) 10 MG tablet    Sig: Take 1 tablet (10 mg total) by mouth daily.    Dispense:  90 tablet    Refill:  0   Patient Instructions                       Medicare Annual Wellness Visit  [facility] and the medical providers at [facility] strive to bring you the best medical care.  In doing so we not only want to address your current medical conditions and concerns but also to detect new conditions early and prevent illness, disease and health-related problems.    Medicare offers a yearly Wellness Visit which allows our clinical staff to assess your need for preventative services including immunizations, lifestyle education, counseling to decrease risk of preventable diseases and screening for fall risk and other medical concerns.    This visit is provided free of charge (no copay) for all Medicare recipients. The clinical pharmacists at [facility] have begun to conduct these Wellness Visits which will also include a thorough review of all your medications.     As you primary medical provider recommend that you make an appointment for your Annual Wellness Visit if you have not done so already this year.  You may set up this appointment before you leave today or you may call back ([phone]) and schedule an appointment.  Please make sure when you call that you mention that you are scheduling your Annual Wellness Visit with the clinical pharmacist so that the appointment may be made for the proper length of time.      Continue current medications. Continue good therapeutic lifestyle changes which include good diet and exercise. Fall precautions discussed with patient. If an FOBT was given today- please return it to our front desk. If you are over 55 years old - you may need Prevnar 63 or the adult Pneumonia vaccine.  **Flu shots are available--- please call and schedule a FLU-CLINIC appointment**  After your visit with [place] today you will receive a survey in the mail or online from [employer] regarding your care with [place]. Please take a moment to fill this out. Your feedback is very important to [place] as you can help [place] better understand your patient needs as well as improve your experience and satisfaction. WE CARE ABOUT YOU!!!   The patient will call in a couple weeks regarding his depression and anxiety and we will increase the Paxil if he still feels like he can get more benefit and we will continue to ask him to try to reduce the Xanax gradually. He should stay active as physically as possible while continuing to use his oxygen on a regular basis at 3 L/m. He should make every effort to lose as much weight as possible with diet and exercise    Arrie Senate MD

## 2016-11-23 ENCOUNTER — Encounter: Payer: Self-pay | Admitting: Family Medicine

## 2016-11-23 DIAGNOSIS — D696 Thrombocytopenia, unspecified: Secondary | ICD-10-CM

## 2016-11-23 DIAGNOSIS — J441 Chronic obstructive pulmonary disease with (acute) exacerbation: Secondary | ICD-10-CM | POA: Diagnosis not present

## 2016-11-23 DIAGNOSIS — I5040 Unspecified combined systolic (congestive) and diastolic (congestive) heart failure: Secondary | ICD-10-CM | POA: Diagnosis not present

## 2016-11-23 HISTORY — DX: Thrombocytopenia, unspecified: D69.6

## 2016-11-23 LAB — CBC WITH DIFFERENTIAL/PLATELET
Basophils Absolute: 0 10*3/uL (ref 0.0–0.2)
Basos: 0 %
EOS (ABSOLUTE): 0.1 10*3/uL (ref 0.0–0.4)
EOS: 2 %
HEMOGLOBIN: 14.2 g/dL (ref 13.0–17.7)
Hematocrit: 43 % (ref 37.5–51.0)
IMMATURE GRANS (ABS): 0 10*3/uL (ref 0.0–0.1)
Immature Granulocytes: 0 %
LYMPHS ABS: 1.3 10*3/uL (ref 0.7–3.1)
Lymphs: 22 %
MCH: 30.3 pg (ref 26.6–33.0)
MCHC: 33 g/dL (ref 31.5–35.7)
MCV: 92 fL (ref 79–97)
MONOCYTES: 9 %
Monocytes Absolute: 0.5 10*3/uL (ref 0.1–0.9)
NEUTROS ABS: 4.1 10*3/uL (ref 1.4–7.0)
Neutrophils: 67 %
PLATELETS: 133 10*3/uL — AB (ref 150–379)
RBC: 4.69 x10E6/uL (ref 4.14–5.80)
RDW: 13.4 % (ref 12.3–15.4)
WBC: 6 10*3/uL (ref 3.4–10.8)

## 2016-11-23 LAB — LIPID PANEL
CHOLESTEROL TOTAL: 152 mg/dL (ref 100–199)
Chol/HDL Ratio: 3.3 ratio (ref 0.0–5.0)
HDL: 46 mg/dL (ref >39)
LDL CALC: 44 mg/dL (ref 0–99)
Triglycerides: 309 mg/dL — ABNORMAL HIGH (ref 0–149)
VLDL CHOLESTEROL CAL: 62 mg/dL — AB (ref 5–40)

## 2016-11-23 LAB — BMP8+EGFR
BUN / CREAT RATIO: 22 (ref 10–24)
BUN: 19 mg/dL (ref 8–27)
CHLORIDE: 89 mmol/L — AB (ref 96–106)
CO2: 36 mmol/L — AB (ref 20–29)
CREATININE: 0.88 mg/dL (ref 0.76–1.27)
Calcium: 9.2 mg/dL (ref 8.6–10.2)
GFR calc Af Amer: 104 mL/min/{1.73_m2} (ref >59)
GFR calc non Af Amer: 90 mL/min/{1.73_m2} (ref >59)
GLUCOSE: 178 mg/dL — AB (ref 65–99)
Potassium: 4.5 mmol/L (ref 3.5–5.2)
SODIUM: 140 mmol/L (ref 134–144)

## 2016-11-23 LAB — PSA, TOTAL AND FREE
PSA, Free Pct: 48.8 %
PSA, Free: 0.39 ng/mL
Prostate Specific Ag, Serum: 0.8 ng/mL (ref 0.0–4.0)

## 2016-11-23 LAB — VITAMIN D 25 HYDROXY (VIT D DEFICIENCY, FRACTURES): VIT D 25 HYDROXY: 29.1 ng/mL — AB (ref 30.0–100.0)

## 2016-11-23 LAB — HEPATIC FUNCTION PANEL
ALBUMIN: 4 g/dL (ref 3.6–4.8)
ALT: 11 IU/L (ref 0–44)
AST: 17 IU/L (ref 0–40)
Alkaline Phosphatase: 73 IU/L (ref 39–117)
Bilirubin Total: 1.6 mg/dL — ABNORMAL HIGH (ref 0.0–1.2)
Bilirubin, Direct: 0.29 mg/dL (ref 0.00–0.40)
TOTAL PROTEIN: 6.3 g/dL (ref 6.0–8.5)

## 2016-11-29 ENCOUNTER — Telehealth: Payer: Self-pay | Admitting: Family Medicine

## 2016-11-29 MED ORDER — PAROXETINE HCL 20 MG PO TABS: 20.0000 mg | ORAL_TABLET | Freq: Every day | ORAL | 5 refills | Status: DC

## 2016-11-29 NOTE — Progress Notes (Signed)
HPI Patient presents for routine followup atrial fib.  He was hospitalized in Dec with hypoglycemia and respiratory failure.  I reviewed these records.  He was treated for pneumonia and probable acute on chronic heart failure with an EF on echo of 50%.  He was diuresed 18 liters.  He had atrial fib that was treated with metoprolol.   He has had problems with decreased memory.  I did review records from a neurology follow up. I don't see any clear etiology for this defined.  At the last visit he was not wearing his O2 and had decreased sats.  Since then he says that he is always on is oxygen.  He has chronic dyspnea but denies any acute symptoms.   Weights have been steady.  His edema has been mild.  The patient denies any new symptoms such as chest discomfort, neck or arm discomfort. There has been no new PND or orthopnea. There have been no reported palpitations, presyncope or syncope.  Allergies  Allergen Reactions  . Avapro [Irbesartan] Other (See Comments)    "doesnt sit right with me"--light headed  . Lipitor [Atorvastatin Calcium] Other (See Comments)    Leg pain  . Penicillins     Unknown reaction    Current Outpatient Prescriptions  Medication Sig Dispense Refill  . acetaminophen (TYLENOL) 500 MG tablet Take 1,000 mg by mouth every 6 (six) hours as needed for headache. Reported on 10/08/2015    . albuterol (PROAIR HFA) 108 (90 Base) MCG/ACT inhaler Inhale 2 puffs every 4-6 hours -rescue inhaler 8.5 g 12  . ALPRAZolam (XANAX) 0.25 MG tablet Take 1 tablet (0.25 mg total) by mouth 3 (three) times daily as needed for anxiety. 90 tablet 1  . Cholecalciferol (VITAMIN D3) 5000 UNITS TABS Take 1 tablet by mouth every morning.    . furosemide (LASIX) 40 MG tablet Take 1 tablet (40 mg total) by mouth 2 (two) times daily. As directed 60 tablet 6  . guaiFENesin (MUCINEX) 600 MG 12 hr tablet Take 600 mg by mouth 2 (two) times daily as needed for cough or to loosen phlegm.    . insulin NPH Human  (NOVOLIN N) 100 UNIT/ML injection Inject 0.5 mLs (50 Units total) into the skin daily before breakfast. 20 mL 1  . insulin regular (NOVOLIN R,HUMULIN R) 250 units/2.65m (100 units/mL) injection 6 units prior to each meal 10 mL 1  . ipratropium-albuterol (DUONEB) 0.5-2.5 (3) MG/3ML SOLN PLACE 1 VIAL (3 MLS) IN THE NEBULIZER EVERY 6 HOURS AS NEEDED 1080 mL 0  . metFORMIN (GLUCOPHAGE) 500 MG tablet TAKE ONE TABLET BY MOUTH IN THE MORNING AND TAKE TWO TABLETS IN THE EVENING 270 tablet 0  . metoprolol (LOPRESSOR) 100 MG tablet Take 1 tablet (100 mg total) by mouth 2 (two) times daily. 180 tablet 3  . PARoxetine (PAXIL) 20 MG tablet Take 1 tablet (20 mg total) by mouth daily. 30 tablet 5  . simvastatin (ZOCOR) 40 MG tablet Take 1 tablet (40 mg total) by mouth daily. 90 tablet 3  . warfarin (COUMADIN) 5 MG tablet Take 1 tablet (5 mg total) by mouth daily. Or as directed by anticoagulation clinic 90 tablet 0  . Blood Glucose Calibration (TAI DOC CONTROL) NORMAL SOLN Use as directed.    . Blood Glucose Monitoring Suppl (CLEVER CHEK AUTO-CODE VOICE) DEVI USE AS DIRECTED 1 each 2  . Lancet Devices (LANCING DEVICE) MISC USE AS DIRECTED 1 each 2   No current facility-administered medications for this visit.  Past Medical History:  Diagnosis Date  . Arthritis   . Asthma   . BPH (benign prostatic hypertrophy)   . Colon polyps   . COPD (chronic obstructive pulmonary disease) ([place])   . Diabetes mellitus   . GERD (gastroesophageal reflux disease)   . History of oxygen administration    occ. oxygen use at 2 l/m as needed- not using at this time  . HTN (hypertension)    x 3 years  . Hyperlipidemia   . Lung nodules 03/11/2013  . Mediastinal adenopathy 06/20/2013   CT  & PET 2/15   . Memory loss   . 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 NHL (non-Hodgkin's lymphoma) ([place])    nhl dx 9/11  . Obesity    exogenous  . Shingles   . Sleep apnea    recent study 04-07-14  awaiting results, no cpap yet.-Dr. Glynn Octave    Past Surgical History:  Procedure Laterality Date  . APPENDECTOMY    . COLONOSCOPY WITH PROPOFOL N/A 05/05/2014   Procedure: COLONOSCOPY WITH PROPOFOL;  Surgeon: Inda Castle, MD;  Location: [facility] ENDOSCOPY;  Service: Endoscopy;  Laterality: N/A;    ROS: As stated in the HPI and negative for all other systems.  PHYSICAL EXAM BP 128/80   Pulse 86   Ht _0  (1.803 m)   Wt 287 lb (130.2 kg)   BMI 40.03 kg/m   GENERAL:  Well appearin NECK:  No jugular venous distention, waveform within normal limits, carotid upstroke brisk and symmetric, no bruits, no thyromegaly LUNGS:  Clear to auscultation bilaterally BACK:  No CVA tenderness CHEST:  Unremarkable HEART:  PMI not displaced or sustained,S1 and S2 within normal limits, no S3, no clicks, no rubs, no murmurs, irregular.   ABD:  Flat, positive bowel sounds normal in frequency in pitch, no bruits, no rebound, no guarding, no midline pulsatile mass, no hepatomegaly, no splenomegaly EXT:  2 plus pulses throughout, mild foot edema, no cyanosis no clubbing   EKG:  Atrial fibrillation rate 86, axis within normal limits, intervals within normal limits, diffuse T wave flattening. Premature ectopic beats  11/30/2016  Lab Results  Component Value Date   HGBA1C 5.9 (H) 03/28/2016     ASSESSMENT AND PLAN  FIBRILLATION, ATRIAL -   [name] has a CHA2DS2 - VASc score of 2 with a risk of stroke of 2.2%.  The patient  tolerates this rhythm and rate control and anticoagulation. We will continue with the meds as listed.  ACUTE ON CHRONIC DIASTOLIC HF -  He seems to be euvolemic.  No change in therapy.   HTN -  The blood pressure is controlled.  He will continue the meds as listed.   OBESITY, UNSPECIFIED -   The patient understands the need to lose weight with diet and exercise.   DM -   His is A1c was 5.9.  He will continue the meds as listed.   DYSPNEA - This is baseline.  He is  using 3 liters of O2 and I will defer to his primary team as to whether he still needs this level.

## 2016-11-29 NOTE — Telephone Encounter (Signed)
paxil increased to 20mg  at patient request

## 2016-11-30 ENCOUNTER — Ambulatory Visit (INDEPENDENT_AMBULATORY_CARE_PROVIDER_SITE_OTHER): Payer: Medicare HMO | Admitting: Cardiology

## 2016-11-30 ENCOUNTER — Encounter: Payer: Self-pay | Admitting: Cardiology

## 2016-11-30 VITALS — BP 128/80 | HR 86 | Ht 71.0 in | Wt 287.0 lb

## 2016-11-30 DIAGNOSIS — I5032 Chronic diastolic (congestive) heart failure: Secondary | ICD-10-CM | POA: Diagnosis not present

## 2016-11-30 DIAGNOSIS — I10 Essential (primary) hypertension: Secondary | ICD-10-CM | POA: Diagnosis not present

## 2016-11-30 DIAGNOSIS — I4891 Unspecified atrial fibrillation: Secondary | ICD-10-CM

## 2016-11-30 NOTE — Patient Instructions (Signed)
Medication Instructions:  The current medical regimen is effective;  continue present plan and medications.  Follow-Up: Follow up in 1 year with Dr. Hochrein in [place].  You will receive a letter in the mail 2 months before you are due.  Please call us when you receive this letter to schedule your follow up appointment.  If you need a refill on your cardiac medications before your next appointment, please call your pharmacy.  Thank you for choosing [facility]!!     

## 2016-12-04 ENCOUNTER — Emergency Department (HOSPITAL_COMMUNITY): Payer: Medicare HMO

## 2016-12-04 ENCOUNTER — Observation Stay (HOSPITAL_COMMUNITY)
Admission: EM | Admit: 2016-12-04 | Discharge: 2016-12-05 | Disposition: A | Discharge status: Home / Self Care | Payer: Medicare HMO | Attending: Family Medicine | Admitting: Family Medicine

## 2016-12-04 ENCOUNTER — Encounter (HOSPITAL_COMMUNITY): Payer: Self-pay | Admitting: Emergency Medicine

## 2016-12-04 DIAGNOSIS — I110 Hypertensive heart disease with heart failure: Secondary | ICD-10-CM | POA: Insufficient documentation

## 2016-12-04 DIAGNOSIS — Z7901 Long term (current) use of anticoagulants: Secondary | ICD-10-CM

## 2016-12-04 DIAGNOSIS — I10 Essential (primary) hypertension: Secondary | ICD-10-CM | POA: Diagnosis present

## 2016-12-04 DIAGNOSIS — J441 Chronic obstructive pulmonary disease with (acute) exacerbation: Secondary | ICD-10-CM

## 2016-12-04 DIAGNOSIS — I5031 Acute diastolic (congestive) heart failure: Secondary | ICD-10-CM | POA: Diagnosis present

## 2016-12-04 DIAGNOSIS — I4891 Unspecified atrial fibrillation: Secondary | ICD-10-CM | POA: Diagnosis present

## 2016-12-04 DIAGNOSIS — R0603 Acute respiratory distress: Principal | ICD-10-CM | POA: Insufficient documentation

## 2016-12-04 DIAGNOSIS — Z79899 Other long term (current) drug therapy: Secondary | ICD-10-CM | POA: Diagnosis not present

## 2016-12-04 DIAGNOSIS — E119 Type 2 diabetes mellitus without complications: Secondary | ICD-10-CM

## 2016-12-04 DIAGNOSIS — G4733 Obstructive sleep apnea (adult) (pediatric): Secondary | ICD-10-CM | POA: Diagnosis present

## 2016-12-04 DIAGNOSIS — Z87891 Personal history of nicotine dependence: Secondary | ICD-10-CM | POA: Diagnosis not present

## 2016-12-04 DIAGNOSIS — Z794 Long term (current) use of insulin: Secondary | ICD-10-CM | POA: Insufficient documentation

## 2016-12-04 DIAGNOSIS — I5032 Chronic diastolic (congestive) heart failure: Secondary | ICD-10-CM | POA: Insufficient documentation

## 2016-12-04 DIAGNOSIS — R0602 Shortness of breath: Secondary | ICD-10-CM | POA: Diagnosis not present

## 2016-12-04 DIAGNOSIS — R05 Cough: Secondary | ICD-10-CM | POA: Diagnosis not present

## 2016-12-04 DIAGNOSIS — J9621 Acute and chronic respiratory failure with hypoxia: Secondary | ICD-10-CM | POA: Diagnosis present

## 2016-12-04 DIAGNOSIS — E1140 Type 2 diabetes mellitus with diabetic neuropathy, unspecified: Secondary | ICD-10-CM | POA: Diagnosis not present

## 2016-12-04 HISTORY — DX: Unspecified atrial fibrillation: I48.91

## 2016-12-04 HISTORY — DX: Acute and chronic respiratory failure with hypoxia: J96.21

## 2016-12-04 LAB — D-DIMER, QUANTITATIVE: D-Dimer, Quant: <0.27 ug/mL-FEU (ref 0.00–0.50)

## 2016-12-04 LAB — CBG MONITORING, ED: Glucose-Capillary: 253 mg/dL — ABNORMAL HIGH (ref 65–99)

## 2016-12-04 LAB — CBC WITH DIFFERENTIAL/PLATELET
BASOS ABS: 0 10*3/uL (ref 0.0–0.1)
Basophils Relative: 0 %
EOS PCT: 2 %
Eosinophils Absolute: 0.1 10*3/uL (ref 0.0–0.7)
HEMATOCRIT: 39.7 % (ref 39.0–52.0)
Hemoglobin: 12.8 g/dL — ABNORMAL LOW (ref 13.0–17.0)
LYMPHS ABS: 1 10*3/uL (ref 0.7–4.0)
LYMPHS PCT: 16 %
MCH: 30.3 pg (ref 26.0–34.0)
MCHC: 32.2 g/dL (ref 30.0–36.0)
MCV: 94.1 fL (ref 78.0–100.0)
Monocytes Absolute: 0.4 10*3/uL (ref 0.1–1.0)
Monocytes Relative: 7 %
NEUTROS ABS: 4.8 10*3/uL (ref 1.7–7.7)
Neutrophils Relative %: 75 %
PLATELETS: 167 10*3/uL (ref 150–400)
RBC: 4.22 MIL/uL (ref 4.22–5.81)
RDW: 13.1 % (ref 11.5–15.5)
WBC: 6.4 10*3/uL (ref 4.0–10.5)

## 2016-12-04 LAB — PROTIME-INR
INR: 2.43
Prothrombin Time: 26.9 seconds — ABNORMAL HIGH (ref 11.4–15.2)

## 2016-12-04 LAB — BASIC METABOLIC PANEL
Anion gap: 11 (ref 5–15)
BUN: 18 mg/dL (ref 6–20)
CALCIUM: 8.8 mg/dL — AB (ref 8.9–10.3)
CO2: 36 mmol/L — AB (ref 22–32)
Chloride: 93 mmol/L — ABNORMAL LOW (ref 101–111)
Creatinine, Ser: 0.94 mg/dL (ref 0.61–1.24)
GFR calc Af Amer: >60 mL/min (ref >60)
GLUCOSE: 269 mg/dL — AB (ref 65–99)
Potassium: 3.8 mmol/L (ref 3.5–5.1)
Sodium: 140 mmol/L (ref 135–145)

## 2016-12-04 LAB — TROPONIN I: Troponin I: <0.03 ng/mL (ref <0.03)

## 2016-12-04 LAB — BRAIN NATRIURETIC PEPTIDE: B Natriuretic Peptide: 303 pg/mL — ABNORMAL HIGH (ref 0.0–100.0)

## 2016-12-04 MED ORDER — SODIUM CHLORIDE 0.9% FLUSH: 3.0000 mL | INTRAVENOUS | Status: DC | PRN

## 2016-12-04 MED ORDER — INSULIN ASPART 100 UNIT/ML ~~LOC~~ SOLN
6.0000 [IU] | Freq: Three times a day (TID) | SUBCUTANEOUS | Status: DC
  Administered 2016-12-05 (×3): 6 [IU] via SUBCUTANEOUS

## 2016-12-04 MED ORDER — LISINOPRIL 5 MG PO TABS
2.5000 mg | ORAL_TABLET | Freq: Every day | ORAL | Status: DC
  Administered 2016-12-05: 2.5 mg via ORAL
  Filled 2016-12-04: qty 1

## 2016-12-04 MED ORDER — WARFARIN SODIUM 5 MG PO TABS: 5.0000 mg | ORAL_TABLET | Freq: Every day | ORAL | Status: DC

## 2016-12-04 MED ORDER — SODIUM CHLORIDE 0.9 % IV SOLN: 250.0000 mL | INTRAVENOUS | Status: DC | PRN

## 2016-12-04 MED ORDER — GUAIFENESIN ER 600 MG PO TB12: 600.0000 mg | ORAL_TABLET | Freq: Two times a day (BID) | ORAL | Status: DC | PRN

## 2016-12-04 MED ORDER — ONDANSETRON HCL 4 MG/2ML IJ SOLN: 4.0000 mg | Freq: Four times a day (QID) | INTRAMUSCULAR | Status: DC | PRN

## 2016-12-04 MED ORDER — ASPIRIN EC 81 MG PO TBEC
81.0000 mg | DELAYED_RELEASE_TABLET | Freq: Every day | ORAL | Status: DC
  Administered 2016-12-05: 81 mg via ORAL
  Filled 2016-12-04: qty 1

## 2016-12-04 MED ORDER — SODIUM CHLORIDE 0.9% FLUSH: 3.0000 mL | Freq: Two times a day (BID) | INTRAVENOUS | Status: DC

## 2016-12-04 MED ORDER — ALBUTEROL (5 MG/ML) CONTINUOUS INHALATION SOLN
10.0000 mg/h | INHALATION_SOLUTION | RESPIRATORY_TRACT | Status: DC
  Administered 2016-12-04: 10 mg/h via RESPIRATORY_TRACT
  Filled 2016-12-04 (×2): qty 20

## 2016-12-04 MED ORDER — ALBUTEROL SULFATE (2.5 MG/3ML) 0.083% IN NEBU
10.0000 mg | INHALATION_SOLUTION | Freq: Once | RESPIRATORY_TRACT | Status: AC
  Administered 2016-12-04: 10 mg via RESPIRATORY_TRACT

## 2016-12-04 MED ORDER — METHYLPREDNISOLONE SODIUM SUCC 125 MG IJ SOLR
125.0000 mg | Freq: Once | INTRAMUSCULAR | Status: AC
  Administered 2016-12-04: 125 mg via INTRAVENOUS
  Filled 2016-12-04: qty 2

## 2016-12-04 MED ORDER — INSULIN NPH (HUMAN) (ISOPHANE) 100 UNIT/ML ~~LOC~~ SUSP
50.0000 [IU] | Freq: Every day | SUBCUTANEOUS | Status: DC
  Administered 2016-12-05: 50 [IU] via SUBCUTANEOUS
  Filled 2016-12-04: qty 10

## 2016-12-04 MED ORDER — PREDNISONE 20 MG PO TABS
20.0000 mg | ORAL_TABLET | Freq: Every day | ORAL | Status: DC
  Administered 2016-12-05: 20 mg via ORAL
  Filled 2016-12-04: qty 1

## 2016-12-04 MED ORDER — IPRATROPIUM-ALBUTEROL 0.5-2.5 (3) MG/3ML IN SOLN
3.0000 mL | Freq: Four times a day (QID) | RESPIRATORY_TRACT | Status: DC
  Administered 2016-12-05 (×3): 3 mL via RESPIRATORY_TRACT
  Filled 2016-12-04 (×3): qty 3

## 2016-12-04 MED ORDER — METOPROLOL TARTRATE 50 MG PO TABS
50.0000 mg | ORAL_TABLET | Freq: Two times a day (BID) | ORAL | Status: DC
  Administered 2016-12-05: 50 mg via ORAL
  Filled 2016-12-04: qty 1

## 2016-12-04 MED ORDER — IPRATROPIUM BROMIDE 0.02 % IN SOLN
0.5000 mg | RESPIRATORY_TRACT | Status: AC
  Administered 2016-12-04: 0.5 mg via RESPIRATORY_TRACT
  Filled 2016-12-04: qty 2.5

## 2016-12-04 MED ORDER — INSULIN ASPART 100 UNIT/ML ~~LOC~~ SOLN
0.0000 [IU] | Freq: Three times a day (TID) | SUBCUTANEOUS | Status: DC
Start: 2016-12-05 — End: 2016-12-05
  Administered 2016-12-05: 15 [IU] via SUBCUTANEOUS
  Administered 2016-12-05: 5 [IU] via SUBCUTANEOUS
  Administered 2016-12-05: 15 [IU] via SUBCUTANEOUS

## 2016-12-04 MED ORDER — ALBUTEROL SULFATE (2.5 MG/3ML) 0.083% IN NEBU: 2.5000 mg | INHALATION_SOLUTION | RESPIRATORY_TRACT | Status: DC | PRN

## 2016-12-04 MED ORDER — ACETAMINOPHEN 325 MG PO TABS
650.0000 mg | ORAL_TABLET | ORAL | Status: DC | PRN
Start: 2016-12-04 — End: 2016-12-05

## 2016-12-04 MED ORDER — PAROXETINE HCL 20 MG PO TABS
20.0000 mg | ORAL_TABLET | Freq: Every day | ORAL | Status: DC
  Administered 2016-12-05: 20 mg via ORAL
  Filled 2016-12-04: qty 1

## 2016-12-04 MED ORDER — ALPRAZOLAM 0.25 MG PO TABS
0.2500 mg | ORAL_TABLET | Freq: Three times a day (TID) | ORAL | Status: DC | PRN
  Administered 2016-12-05: 0.25 mg via ORAL
  Filled 2016-12-04: qty 1

## 2016-12-04 MED ORDER — SIMVASTATIN 20 MG PO TABS
40.0000 mg | ORAL_TABLET | Freq: Every day | ORAL | Status: DC
  Administered 2016-12-05: 40 mg via ORAL
  Filled 2016-12-04: qty 2

## 2016-12-04 MED ORDER — FUROSEMIDE 10 MG/ML IJ SOLN: 40.0000 mg | Freq: Once | INTRAMUSCULAR | Status: DC

## 2016-12-04 NOTE — Progress Notes (Signed)
ANTICOAGULATION CONSULT NOTE - Follow Up Consult  Pharmacy Consult for Coumadin (home med) Indication: atrial fibrillation  Allergies  Allergen Reactions  . Avapro [Irbesartan] Other (See Comments)    "doesnt sit right with me"--light headed  . Lipitor [Atorvastatin Calcium] Other (See Comments)    Leg pain    Patient Measurements: Height: 5\' 11"  (180.3 cm) Weight: 287 lb (130.2 kg) IBW/kg (Calculated) : 75.3  Vital Signs: Temp: 98.2 F (36.8 C) (08/12 1437) Temp Source: Oral (08/12 1437) BP: 109/67 (08/12 2139) Pulse Rate: 119 (08/12 2139)  Labs:  Recent Labs  12/04/16 1445  HGB 12.8*  HCT 39.7  PLT 167  CREATININE 0.94  TROPONINI <0.03   Estimated Creatinine Clearance: 107.8 mL/min (by C-G formula based on SCr of 0.94 mg/dL).  Medications:   (Not in a hospital admission)  Assessment: 66yo obese male on Coumadin PTA.  Asked to resume Warfarin for h/o afib.  Baseline INR still pending.    Goal of Therapy:  INR 2-3 Monitor platelets by anticoagulation protocol: Yes   Plan:  Will dose Coumadin when INR reported Check INR daily  Hart Robinsons A 12/04/2016,10:15 PM

## 2016-12-04 NOTE — ED Notes (Signed)
Justin Ruiz (pt's wife) [phone]

## 2016-12-04 NOTE — ED Triage Notes (Signed)
Patient c/o shortness of breath with cough that started yesterday and is progressively getting worse. Per patient cough nonproductive, unsure of any fevers. Denies any chest pain. Patient reports O2 sat 60-70s today when checked at home. Patient states wears oxygen via [place] at home at all times 2.5-3 liters. Patient wearing 3 liters of oxygen at this time. Patient does report using breathing treatments at home with last one prior to arriving to ED.

## 2016-12-04 NOTE — ED Notes (Signed)
Pt ambulated in hallway,  sats decreased to 82 % on 3 L/M [place],  HR increased as high as 190 afib, EDP made aware.   Pt back to bed with HR 105 and sats 95 % on 3 L/M [place].

## 2016-12-04 NOTE — ED Notes (Signed)
ED Provider at bedside. 

## 2016-12-04 NOTE — H&P (Addendum)
History and Physical    STORMY SABOL GYI:[id number] DOB: 03/05/1951 DOA: 12/04/2016  PCP: Chipper Herb, MD Consultants:  Annamaria Boots - pulmonary; [facility] - oncology; [facility]rein - cardiology Patient coming from:  Home - lives alone; [place]: wife, [phone]  Chief Complaint: SOB  HPI: RAFIQ BUCKLIN is a 66 y.o. male with medical history significant of OSA not on CPAP; remote NHL/MGUS; memory loss; HTN; HLD; DM; COPD on home 2.5-3L O2; and afib on anticoagulation presenting with SOB about 2 days ago.    Better since arrival in ER s/p nebs and solumedrol.  "I'm doing real well now".  Slight cough, no more than usual.  Non-productive.  Trying to clear throat more often than usual.  Some wheezing, more than usual.  No sick contacts.  SOB is worse with lying flat, sleeps in recliner at home.  +LE edema.   70% O2 sats at home.   ED Course: Acute respiratory distress from COPD/PNA.  Hypoxic, O2 increased to 4L to 92%.  Continuous neb with improvement.  Negative CXR, BNP 300, D-dimer negative.  O2 sats 82% with ambulation on 3L with dyspnea and tachycardia.  Review of Systems: As per HPI; otherwise review of systems reviewed and negative.   Ambulatory Status:  Ambulates without assistance  Past Medical History:  Diagnosis Date  . Arthritis   . Asthma   . Atrial fibrillation ([place])    on AC  . BPH (benign prostatic hypertrophy)   . Colon polyps   . COPD (chronic obstructive pulmonary disease) ([place])    on 2.5-3L home O2  . Diabetes mellitus   . GERD (gastroesophageal reflux disease)   . HTN (hypertension)    x 3 years  . Hyperlipidemia   . Lung nodules 03/11/2013  . Mediastinal adenopathy 06/20/2013   CT  & PET 2/15   . Memory loss   . 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 NHL (non-Hodgkin's lymphoma) ([place])    nhl dx 9/11  . Obesity    exogenous  . Shingles   . Sleep apnea    CPAP machine is broken    Past Surgical History:  Procedure Laterality Date  .  APPENDECTOMY    . COLONOSCOPY WITH PROPOFOL N/A 05/05/2014   Procedure: COLONOSCOPY WITH PROPOFOL;  Surgeon: Inda Castle, MD;  Location: [facility];  Service: Endoscopy;  Laterality: N/A;    Social History   Social History  . Marital status: Married    Spouse name: N/A  . Number of children: 0  . Years of education: 12   Occupational History  . Disabled    Social History Main Topics  . Smoking status: Former Smoker    Packs/day: 2.00    Years: 40.00    Start date: 12/17/1968    Quit date: 11/14/2004  . Smokeless tobacco: Never Used     Comment: 2 ppd   . Alcohol use No     Comment: previous  . Drug use: No  . Sexual activity: Yes   Other Topics Concern  . Not on file   Social History Narrative   Married, disabled Dealer (COPD). Daily caffeine use - 2 cup/day.   Right-handed.    Allergies  Allergen Reactions  . Avapro [Irbesartan] Other (See Comments)    "doesnt sit right with me"--light headed  . Lipitor [Atorvastatin Calcium] Other (See Comments)    Leg pain    Family History  Problem Relation Age of Onset  . Liver cancer Mother   .  Diabetes Mother   . Heart disease Mother   . Colon cancer Father   . Prostate cancer Father   . Colon polyps Father   . Pulmonary embolism Sister   . Diabetes Sister   . Heart attack Sister   . Aortic aneurysm Sister   . COPD Sister   . Heart disease Sister   . COPD Sister   . Heart disease Sister   . Diabetes Maternal Grandmother     Prior to Admission medications   Medication Sig Start Date End Date Taking? Authorizing Provider  acetaminophen (TYLENOL) 500 MG tablet Take 1,000 mg by mouth every 6 (six) hours as needed for headache. Reported on 10/08/2015    [provider]  albuterol [facility] HFA) 108 (90 Base) MCG/ACT inhaler Inhale 2 puffs every 4-6 hours -rescue inhaler 08/09/16   Deneise Lever, MD  ALPRAZolam Duanne Moron) 0.25 MG tablet Take 1 tablet (0.25 mg total) by mouth 3 (three) times daily as  needed for anxiety. 11/08/16   Chipper Herb, MD  Blood Glucose Calibration (TAI [facility] CONTROL) NORMAL SOLN Use as directed. 09/10/13   [provider]  Blood Glucose Monitoring Suppl (CLEVER CHEK AUTO-CODE VOICE) DEVI USE AS DIRECTED 11/13/12   Chipper Herb, MD  Cholecalciferol (VITAMIN D3) 5000 UNITS TABS Take 1 tablet by mouth every morning.    [provider]  furosemide (LASIX) 40 MG tablet Take 1 tablet (40 mg total) by mouth 2 (two) times daily. As directed 10/19/16   Chipper Herb, MD  guaiFENesin (MUCINEX) 600 MG 12 hr tablet Take 600 mg by mouth 2 (two) times daily as needed for cough or to loosen phlegm.    [provider]  insulin NPH Human (NOVOLIN N) 100 UNIT/ML injection Inject 0.5 mLs (50 Units total) into the skin daily before breakfast. 07/05/16   Cherre Robins, PharmD  insulin regular (NOVOLIN R,HUMULIN R) 250 units/2.27m (100 units/mL) injection 6 units prior to each meal 05/03/16   ECherre Robins PharmD  ipratropium-albuterol (DUONEB) 0.5-2.5 (3) MG/3ML SOLN PLACE 1 VIAL (3 MLS) IN THE NEBULIZER EVERY 6 HOURS AS NEEDED 11/10/14   MChipper Herb MD  Lancet Devices (LANCING DEVICE) MISC USE AS DIRECTED 11/13/12   MChipper Herb MD  metFORMIN (GLUCOPHAGE) 500 MG tablet TAKE ONE TABLET BY MOUTH IN THE MORNING AND TAKE TWO TABLETS IN THE EVENING 04/26/16   MChipper Herb MD  metoprolol (LOPRESSOR) 100 MG tablet Take 1 tablet (100 mg total) by mouth 2 (two) times daily. 04/08/16   MChipper Herb MD  PARoxetine (PAXIL) 20 MG tablet Take 1 tablet (20 mg total) by mouth daily. 11/29/16   MHassell Done Mary-Margaret, FNP  simvastatin (ZOCOR) 40 MG tablet Take 1 tablet (40 mg total) by mouth daily. 10/11/16   MChipper Herb MD  warfarin (COUMADIN) 5 MG tablet Take 1 tablet (5 mg total) by mouth daily. Or as directed by anticoagulation clinic 08/16/16   MChipper Herb MD    Physical Exam: Vitals:   12/04/16 1930 12/04/16 1943 12/04/16 1945 12/04/16 2000  BP: 93/73    (!) 107/58  Pulse: 94 66 64   Resp: 20 (!) 25 (!) 22 (!) 23  Temp:      TempSrc:      SpO2: (!) 87% 92% 91%   Weight:      Height:         General:  Appears calm and comfortable and is NAD Eyes:  PERRL, EOMI, normal lids,  iris ENT:  grossly normal hearing, lips & tongue, mmm; appropriate dentition Neck:  no LAD, masses or thyromegaly; no carotid bruits Cardiovascular:  Irregularly irregular with tachycardia to 110 range, no m/r/g. 2+ taut pitting LE edema.  Respiratory:   Moderate to poor air movement with bibasilar crackles.  Normal respiratory effort. Abdomen:  soft, NT, ND, NABS Back:   normal alignment, no CVAT Skin:  no rash or induration seen on limited exam Musculoskeletal:  grossly normal tone BUE/BLE, good ROM, no bony abnormality Psychiatric:  grossly normal mood and affect, speech fluent and appropriate, AOx3 Neurologic:  CN 2-12 grossly intact, moves all extremities in coordinated fashion, sensation intact    Radiological Exams on Admission: Dg Chest Portable 1 View  Result Date: 12/04/2016 CLINICAL DATA:  Shortness of breath with cough EXAM: PORTABLE CHEST 1 VIEW COMPARISON:  10/19/2016 FINDINGS: The heart size and mediastinal contours are within normal limits. Both lungs are clear. The visualized skeletal structures are unremarkable. IMPRESSION: No active disease. Electronically Signed   By: Inez Catalina M.D.   On: 12/04/2016 15:11    EKG: Independently reviewed.  Afib with rate 88; prolonged QT 638; nonspecific ST changes with no evidence of acute ischemia   Labs on Admission: I have personally reviewed the available labs and imaging studies at the time of the admission.  Pertinent labs:  Glucose 269, 253 BNP 303; 107.5 on 6/27   Assessment/Plan Principal Problem:   Acute on chronic respiratory failure with hypoxia (HCC) Active Problems:   Diabetes mellitus type 2, insulin dependent (HCC)   Hypertension   COPD exacerbation (HCC)   Chronic  anticoagulation   Obstructive sleep apnea   Acute diastolic heart failure (HCC)   Atrial fibrillation with RVR (HCC)   Acute on chronic respiratory failure with hypoxia -Patient with shortness of breath and mild cough, worse with lying flat -On 2.5-3L home O2 for chronic COPD -The symptoms appear to be caused by a combination of acute COPD exacerbation and diastolic heart failure exacerbation.  -Patient received nebs and Solumedrol in the ED with improvement in symptoms while at rest, but ongoing hypoxia and symptoms with ambulation.  -Elevated BNP -Other differential diagnoses include, but less likely, pulmonary embolism (negative D-dimer), pneumonia (does not have fever or leukocytosis. Chest x-ray is not consistent with pneumonia) -Nebulizers: scheduled Duoneb and prn albuterol -Prednisone 20 mg po daily -He does not appear to need antibiotics at this time, as he has only 1 of the 3 cardinal symptoms.  -Will place in observation status with telemetry - initial thought was for inpatient admission but since the patient improved so much while in the ER it is reasonable to think he may be ready for discharge in the next 48 hours -Will order Lasix 40 mg IV x 1 -Will request repeat echocardiogram - last Echo was in December and EF could not be evaluated due to afib with RVR -Will start Lisinopril 2.5 mg daily -Will halve beta blocker due to borderline hypotension -CHF order set utilized; may need CHF team consult but will hold until Echo results are available -Continue [place] O2  -Normal kidney function at this time, will follow -Repeat EKG in AM  Afib on [facility]   -Patient presenting with chronic afib.  -Reasonable rate control so will place on telemetry for now without plan for Diltiazem drip unless rate control deteriorates -Patient is on Coumadin; will request pharmacy to dose, as it appears to have not be therapeutic since 6/8.  DM -Continue NPH and  AC insulin -Hold Glucophage -Cover with  SSI, moderate scale  HTN -Decrease Lopressor to 50 mg BID -Add Lisinopril 2.5 mg  OSA -Broken CPAP at home so not currently using -Will start with autopap, as this could be worsening his respiratory issues   DVT prophylaxis: Coumadin Code Status:  Full - confirmed with patient Family Communication: None present  Disposition Plan:  Home once clinically improved Consults called: CM/RT/Nutrition/SW/PT/OT Admission status: It is my clinical opinion that referral for OBSERVATION is reasonable and necessary in this patient based on the above information provided. The aforementioned taken together are felt to place the patient at high risk for further clinical deterioration. However it is anticipated that the patient may be medically stable for discharge from the hospital within 24 to 48 hours.    Karmen Bongo MD [facility]  If note is complete, please contact covering daytime or nighttime physician. [email or web address] Password [facility]  12/04/2016, 8:48 PM

## 2016-12-04 NOTE — ED Notes (Signed)
EDP made aware of pt's sats 84-85% on 4 L/M [place]

## 2016-12-04 NOTE — ED Provider Notes (Signed)
[place] DEPT Provider Note   CSN: [id number] Arrival date & time: 12/04/16  1425     History   Chief Complaint Chief Complaint  Patient presents with  . Shortness of Breath    HPI Justin Ruiz is a 66 y.o. male.  HPI  The patient is a 66 year old male, known history of COPD, wears BiPAP at night and 2 L by nasal cannula constantly. He has hypertension, acid reflux, diabetes and a history of monoclonal gammopathy of unknown significance and non-Hodgkin's lymphoma. The patient states that over the last several days he has had worsening shortness of breath, increased difficulty breathing, increased wheezing, increased coughing, denies any phlegm production. He denies having any vomiting, swelling in his legs, rashes, diarrhea or fevers. He does report having a slight headache. He has not been using any albuterol treatments prior to arrival but has increased his oxygen consumption at home because of his increased dyspnea especially on exertion. He is usually on 2 L but has increased to 3 L and was still around 70% on the patient's home measurement  Past Medical History:  Diagnosis Date  . Arthritis   . Asthma   . BPH (benign prostatic hypertrophy)   . Colon polyps   . COPD (chronic obstructive pulmonary disease) ([place])   . Diabetes mellitus   . GERD (gastroesophageal reflux disease)   . History of oxygen administration    occ. oxygen use at 2 l/m as needed- not using at this time  . HTN (hypertension)    x 3 years  . Hyperlipidemia   . Lung nodules 03/11/2013  . Mediastinal adenopathy 06/20/2013   CT  & PET 2/15   . Memory loss   . 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 NHL (non-Hodgkin's lymphoma) ([place])    nhl dx 9/11  . Obesity    exogenous  . Shingles   . Sleep apnea    recent study 04-07-14 awaiting results, no cpap yet.-Dr. Glynn Octave    Patient Active Problem List   Diagnosis Date Noted  . Thrombocytopenia ([place]) 11/23/2016  .  Memory loss 04/13/2016  . Hypomagnesemia   . Morbid obesity ([place]) 03/29/2016  . Hypoglycemia 03/28/2016  . Acute encephalopathy 03/28/2016  . Acute diastolic heart failure ([place]) 03/28/2016  . COPD mixed type ([place]) 03/28/2016  . Atrial fibrillation with RVR ([place]) 03/28/2016  . Acute on chronic respiratory failure ([place]) 03/28/2016  . 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 Chronic diastolic heart failure ([place])   . CAP (community acquired pneumonia)   . Acute-on-chronic respiratory failure ([place]) 06/07/2015  . Sepsis ([place]) 06/07/2015  . Diabetic neuropathy, type II diabetes mellitus ([place]) 09/25/2014  . Chronic respiratory failure with hypoxia ([place]) 05/25/2014  . Obstructive sleep apnea 05/25/2014  . Benign neoplasm of ascending colon 05/05/2014  . Excessive daytime sleepiness 01/24/2014  . Mediastinal adenopathy 06/20/2013  . Severe obesity (BMI >= 40) ([place]) 05/01/2013  . Generalized anxiety disorder 05/01/2013  . Lung nodules 03/11/2013  . Chronic anticoagulation 08/20/2012  . FIBRILLATION, ATRIAL 05/12/2010  . NHL (non-Hodgkin's lymphoma) ([place]) 01/26/2010  . Obesity, morbid ([place]) 04/27/2009  . CARDIOVASCULAR STUDIES, ABNORMAL 04/27/2009  . ABNORMAL STRESS ELECTROCARDIOGRAM 03/25/2009  . PERSONAL HISTORY OF COLONIC POLYPS 03/02/2009  . Diabetes mellitus type 2, insulin dependent ([place]) 04/29/2007  . Hyperlipidemia 04/29/2007  . Hypertension 04/29/2007  . Seasonal and perennial allergic rhinitis 04/29/2007  . COPD exacerbation (  [place]) 04/29/2007  . G E R D 04/29/2007  . BENIGN PROSTATIC HYPERTROPHY, HX OF 04/29/2007    Past Surgical History:  Procedure Laterality Date  . APPENDECTOMY    . COLONOSCOPY WITH PROPOFOL N/A 05/05/2014   Procedure: COLONOSCOPY WITH PROPOFOL;  Surgeon: Inda Castle, MD;  Location: [facility];  Service: Endoscopy;  Laterality: N/A;        Home Medications    Prior to Admission medications   Medication Sig Start Date End Date Taking? Authorizing Provider  acetaminophen (TYLENOL) 500 MG tablet Take 1,000 mg by mouth every 6 (six) hours as needed for headache. Reported on 10/08/2015    [provider]  albuterol [facility] HFA) 108 (90 Base) MCG/ACT inhaler Inhale 2 puffs every 4-6 hours -rescue inhaler 08/09/16   Deneise Lever, MD  ALPRAZolam Duanne Moron) 0.25 MG tablet Take 1 tablet (0.25 mg total) by mouth 3 (three) times daily as needed for anxiety. 11/08/16   Chipper Herb, MD  Blood Glucose Calibration (TAI [facility] CONTROL) NORMAL SOLN Use as directed. 09/10/13   [provider]  Blood Glucose Monitoring Suppl (CLEVER CHEK AUTO-CODE VOICE) DEVI USE AS DIRECTED 11/13/12   Chipper Herb, MD  Cholecalciferol (VITAMIN D3) 5000 UNITS TABS Take 1 tablet by mouth every morning.    [provider]  furosemide (LASIX) 40 MG tablet Take 1 tablet (40 mg total) by mouth 2 (two) times daily. As directed 10/19/16   Chipper Herb, MD  guaiFENesin (MUCINEX) 600 MG 12 hr tablet Take 600 mg by mouth 2 (two) times daily as needed for cough or to loosen phlegm.    [provider]  insulin NPH Human (NOVOLIN N) 100 UNIT/ML injection Inject 0.5 mLs (50 Units total) into the skin daily before breakfast. 07/05/16   Cherre Robins, PharmD  insulin regular (NOVOLIN R,HUMULIN R) 250 units/2.49m (100 units/mL) injection 6 units prior to each meal 05/03/16   ECherre Robins PharmD  ipratropium-albuterol (DUONEB) 0.5-2.5 (3) MG/3ML SOLN PLACE 1 VIAL (3 MLS) IN THE NEBULIZER EVERY 6 HOURS AS NEEDED 11/10/14   MChipper Herb MD  Lancet Devices (LANCING DEVICE) MISC USE AS DIRECTED 11/13/12   MChipper Herb MD  metFORMIN (GLUCOPHAGE) 500 MG tablet TAKE ONE TABLET BY MOUTH IN THE MORNING AND TAKE TWO TABLETS IN THE EVENING 04/26/16   MChipper Herb MD  metoprolol (LOPRESSOR) 100 MG tablet Take 1 tablet (100 mg total) by mouth 2  (two) times daily. 04/08/16   MChipper Herb MD  PARoxetine (PAXIL) 20 MG tablet Take 1 tablet (20 mg total) by mouth daily. 11/29/16   MHassell Done Mary-Margaret, FNP  simvastatin (ZOCOR) 40 MG tablet Take 1 tablet (40 mg total) by mouth daily. 10/11/16   MChipper Herb MD  warfarin (COUMADIN) 5 MG tablet Take 1 tablet (5 mg total) by mouth daily. Or as directed by anticoagulation clinic 08/16/16   MChipper Herb MD    Family History Family History  Problem Relation Age of Onset  . Liver cancer Mother   . Diabetes Mother   . Heart disease Mother   . Colon cancer Father   . Prostate cancer Father   . Colon polyps Father   . Pulmonary embolism Sister   . Diabetes Sister   . Heart attack Sister   . Aortic aneurysm Sister   . COPD Sister   . Heart disease Sister   . COPD Sister   . Heart disease Sister   . Diabetes Maternal  Grandmother     Social History Social History  Substance Use Topics  . Smoking status: Former Smoker    Packs/day: 2.00    Years: 40.00    Start date: 12/17/1968    Quit date: 11/14/2004  . Smokeless tobacco: Never Used     Comment: 2 ppd   . Alcohol use No     Comment: previous     Allergies   Avapro [irbesartan]; Lipitor [atorvastatin calcium]; and Penicillins   Review of Systems Review of Systems  All other systems reviewed and are negative.    Physical Exam Updated Vital Signs BP 106/62   Pulse 100   Temp 98.2 F (36.8 C) (Oral)   Resp (!) 22   Ht '5\' 11"'  (1.803 m)   Wt 130.2 kg (287 lb)   SpO2 97%   BMI 40.03 kg/m   Physical Exam  Constitutional: He appears well-developed and well-nourished. He appears distressed.  HENT:  Head: Normocephalic and atraumatic.  Mouth/Throat: Oropharynx is clear and moist. No oropharyngeal exudate.  Eyes: Pupils are equal, round, and reactive to light. Conjunctivae and EOM are normal. Right eye exhibits no discharge. Left eye exhibits no discharge. No scleral icterus.  Neck: Normal range of motion.  Neck supple. No JVD present. No thyromegaly present.  Cardiovascular: Normal rate, regular rhythm, normal heart sounds and intact distal pulses.  Exam reveals no gallop and no friction rub.   No murmur heard. Pulmonary/Chest: He is in respiratory distress. He has wheezes. He has no rales.  Increased work of breathing, first lip breathing, diffuse expiratory wheezing and prolonged expiratory phase, speaks in very short sentences  Abdominal: Soft. Bowel sounds are normal. He exhibits no distension and no mass. There is no tenderness.  Musculoskeletal: Normal range of motion. He exhibits edema ( symmetricalsymmetrical 1+ pitting edema below the knees). He exhibits no tenderness.  Lymphadenopathy:    He has no cervical adenopathy.  Neurological: He is alert. Coordination normal.  Skin: Skin is warm and dry. No rash noted. No erythema.  Psychiatric: He has a normal mood and affect. His behavior is normal.  Nursing note and vitals reviewed.    ED Treatments / Results  Labs (all labs ordered are listed, but only abnormal results are displayed) Labs Reviewed  CBC WITH DIFFERENTIAL/PLATELET - Abnormal; Notable for the following:       Result Value   Hemoglobin 12.8 (*)    All other components within normal limits  BASIC METABOLIC PANEL - Abnormal; Notable for the following:    Chloride 93 (*)    CO2 36 (*)    Glucose, Bld 269 (*)    Calcium 8.8 (*)    All other components within normal limits  BRAIN NATRIURETIC PEPTIDE - Abnormal; Notable for the following:    B Natriuretic Peptide 303.0 (*)    All other components within normal limits  TROPONIN I  D-DIMER, QUANTITATIVE (NOT AT [facility])    ED ECG REPORT  I personally interpreted this EKG   Date: 12/04/2016   Rate: 88  Rhythm: atrial fibrillation  QRS Axis: normal  Intervals: normal  ST/T Wave abnormalities: nonspecific T wave changes  Conduction Disutrbances:none  Narrative Interpretation:   Old EKG Reviewed:  unchanged  Radiology Dg Chest Portable 1 View  Result Date: 12/04/2016 CLINICAL DATA:  Shortness of breath with cough EXAM: PORTABLE CHEST 1 VIEW COMPARISON:  10/19/2016 FINDINGS: The heart size and mediastinal contours are within normal limits. Both lungs are clear. The visualized skeletal structures are  unremarkable. IMPRESSION: No active disease. Electronically Signed   By: Inez Catalina M.D.   On: 12/04/2016 15:11    Procedures Procedures (including critical care time)  CRITICAL CARE Performed by: Johnna Acosta Total critical care time: 35 minutes Critical care time was exclusive of separately billable procedures and treating other patients. Critical care was necessary to treat or prevent imminent or life-threatening deterioration. Critical care was time spent personally by me on the following activities: development of treatment plan with patient and/or surrogate as well as nursing, discussions with consultants, evaluation of patient's response to treatment, examination of patient, obtaining history from patient or surrogate, ordering and performing treatments and interventions, ordering and review of laboratory studies, ordering and review of radiographic studies, pulse oximetry and re-evaluation of patient's condition.   Medications Ordered in ED Medications  albuterol (PROVENTIL,VENTOLIN) solution continuous neb (10 mg/hr Nebulization New Bag/Given 12/04/16 1522)  ipratropium (ATROVENT) nebulizer solution 0.5 mg (0.5 mg Nebulization Given 12/04/16 1522)  methylPREDNISolone sodium succinate (SOLU-MEDROL) 125 mg/2 mL injection 125 mg (125 mg Intravenous Given 12/04/16 1448)  albuterol (PROVENTIL) (2.5 MG/3ML) 0.083% nebulizer solution 10 mg (10 mg Nebulization Given 12/04/16 1700)     Initial Impression / Assessment and Plan / ED Course  I have reviewed the triage vital signs and the nursing notes.  Pertinent labs & imaging results that were available during my care of the patient  were reviewed by me and considered in my medical decision making (see chart for details).     The patient is in acute respiratory distress likely secondary to COPD with possible pneumonia. He is hypoxic requiring increased levels of oxygen, 4 L brings him to 92%. He is moving very little air and needs continuous nebulous her therapy and steroids, anticipate admission the way the patient appears.  The patient got a continuous nebulizer which improved his lung sounds but he was still dyspneic and hypoxic on ambulation. His x-ray did not show pneumonia, labs did not show any definite etiology including a BNP that was less than 400 a troponin which was undetectable and a d-dimer which was undetectable.  A second continuous nebulizer was given and after that the patient ambulated but still had hypoxia to 82% on 3 L and became dyspneic and tachycardic. The patient will be admitted to the hospital under the service of the hospitalist, Dr. Karmen Bongo, I appreciate her willingness to assist in the care of this patient.  Critical care provided for this patient was persistently hypoxic despite continuous nebulzer therapy.   Final Clinical Impressions(s) / ED Diagnoses   Final diagnoses:  Respiratory distress  COPD exacerbation ([place])    New Prescriptions New Prescriptions   No medications on file     Noemi Chapel, MD 12/04/16 1831

## 2016-12-05 ENCOUNTER — Observation Stay (HOSPITAL_COMMUNITY): Payer: Medicare HMO

## 2016-12-05 ENCOUNTER — Ambulatory Visit: Payer: Self-pay | Admitting: Pharmacist

## 2016-12-05 DIAGNOSIS — M6281 Muscle weakness (generalized): Secondary | ICD-10-CM | POA: Diagnosis not present

## 2016-12-05 DIAGNOSIS — J441 Chronic obstructive pulmonary disease with (acute) exacerbation: Secondary | ICD-10-CM | POA: Diagnosis not present

## 2016-12-05 DIAGNOSIS — I5031 Acute diastolic (congestive) heart failure: Secondary | ICD-10-CM

## 2016-12-05 DIAGNOSIS — J9621 Acute and chronic respiratory failure with hypoxia: Secondary | ICD-10-CM

## 2016-12-05 DIAGNOSIS — Z794 Long term (current) use of insulin: Secondary | ICD-10-CM

## 2016-12-05 DIAGNOSIS — E119 Type 2 diabetes mellitus without complications: Secondary | ICD-10-CM | POA: Diagnosis not present

## 2016-12-05 DIAGNOSIS — R531 Weakness: Secondary | ICD-10-CM | POA: Diagnosis not present

## 2016-12-05 DIAGNOSIS — J449 Chronic obstructive pulmonary disease, unspecified: Secondary | ICD-10-CM | POA: Diagnosis not present

## 2016-12-05 DIAGNOSIS — I5040 Unspecified combined systolic (congestive) and diastolic (congestive) heart failure: Secondary | ICD-10-CM | POA: Diagnosis not present

## 2016-12-05 LAB — CBC WITH DIFFERENTIAL/PLATELET
Basophils Absolute: 0 K/uL (ref 0.0–0.1)
Basophils Relative: 0 %
Eosinophils Absolute: 0 K/uL (ref 0.0–0.7)
Eosinophils Relative: 0 %
HCT: 37.4 % — ABNORMAL LOW (ref 39.0–52.0)
Hemoglobin: 12.2 g/dL — ABNORMAL LOW (ref 13.0–17.0)
Lymphocytes Relative: 10 %
Lymphs Abs: 0.6 K/uL — ABNORMAL LOW (ref 0.7–4.0)
MCH: 30.4 pg (ref 26.0–34.0)
MCHC: 32.6 g/dL (ref 30.0–36.0)
MCV: 93.3 fL (ref 78.0–100.0)
Monocytes Absolute: 0.2 K/uL (ref 0.1–1.0)
Monocytes Relative: 3 %
Neutro Abs: 5.5 K/uL (ref 1.7–7.7)
Neutrophils Relative %: 87 %
Platelets: 166 K/uL (ref 150–400)
RBC: 4.01 MIL/uL — ABNORMAL LOW (ref 4.22–5.81)
RDW: 13.3 % (ref 11.5–15.5)
WBC: 6.2 K/uL (ref 4.0–10.5)

## 2016-12-05 LAB — BASIC METABOLIC PANEL
ANION GAP: 11 (ref 5–15)
BUN: 22 mg/dL — ABNORMAL HIGH (ref 6–20)
CALCIUM: 8.8 mg/dL — AB (ref 8.9–10.3)
CO2: 37 mmol/L — AB (ref 22–32)
Chloride: 88 mmol/L — ABNORMAL LOW (ref 101–111)
Creatinine, Ser: 0.89 mg/dL (ref 0.61–1.24)
Glucose, Bld: 391 mg/dL — ABNORMAL HIGH (ref 65–99)
Potassium: 4.2 mmol/L (ref 3.5–5.1)
SODIUM: 136 mmol/L (ref 135–145)

## 2016-12-05 LAB — GLUCOSE, CAPILLARY
GLUCOSE-CAPILLARY: 393 mg/dL — AB (ref 65–99)
GLUCOSE-CAPILLARY: 395 mg/dL — AB (ref 65–99)
GLUCOSE-CAPILLARY: 441 mg/dL — AB (ref 65–99)
GLUCOSE-CAPILLARY: 240 mg/dL — AB (ref 65–99)

## 2016-12-05 LAB — PROTIME-INR
INR: 2.47
Prothrombin Time: 27.2 seconds — ABNORMAL HIGH (ref 11.4–15.2)

## 2016-12-05 MED ORDER — WARFARIN SODIUM 5 MG PO TABS
5.0000 mg | ORAL_TABLET | Freq: Once | ORAL | Status: AC
  Administered 2016-12-05: 5 mg via ORAL
  Filled 2016-12-05: qty 1

## 2016-12-05 MED ORDER — PREDNISONE 20 MG PO TABS: ORAL_TABLET | ORAL | 0 refills | Status: DC

## 2016-12-05 MED ORDER — WARFARIN - PHARMACIST DOSING INPATIENT: Status: DC

## 2016-12-05 NOTE — Evaluation (Signed)
Physical Therapy Evaluation Patient Details Name: Justin Ruiz MRN: [id number] DOB: 1951/02/04 Today's Date: 12/05/2016   History of Present Illness  Justin Ruiz is a 66 y.o. male with medical history significant of OSA not on CPAP; remote NHL/MGUS; memory loss; HTN; HLD; DM; COPD on home 2.5-3L O2; and afib on anticoagulation presenting with SOB about 2 days ago.    Better since arrival in ER s/p nebs and solumedrol.  "I'm doing real well now".  Slight cough, no more than usual.  Non-productive.  Trying to clear throat more often than usual.  Some wheezing, more than usual.  No sick contacts.  SOB is worse with lying flat, sleeps in recliner at home.  +LE edema.   70% O2 sats at home  Clinical Impression  Pt received sitting EOB and was agreeable to PT evaluation. Pt from home where he is independent with household ambulation but verbalized that he furniture walks throughout his house for steadiness, he is independent with dressing, bathing, driving, and utilizes motorized scooters for community ambulation. Pt ambulated 180ft with RW this date and required 1 standing rest break; pt's vitals were 89% O2 and 176 HR. Returned to pt's room and following 3-4 mins seated rest break, his vitals improved to 97% O2 and 79 HR. Due to deficits in endurance and general mobility, recommend HHPT vs no PT depending on pt's progress acutely. Will continue to follow acutely and update recommendations as needed.    Follow Up Recommendations Home health PT;No PT follow up    Equipment Recommendations  None recommended by PT    Recommendations for Other Services       Precautions / Restrictions Precautions Precautions: Fall Precaution Comments: due to oxygen tank and vitals      Mobility  Bed Mobility               General bed mobility comments: n/a - received and left sitting EOB  Transfers Overall transfer level: Needs assistance   Transfers: Sit to/from Stand Sit to Stand: Supervision             Ambulation/Gait Ambulation/Gait assistance: Supervision Ambulation Distance (Feet): 100 Feet Assistive device: Rolling walker (2 wheeled) Gait Pattern/deviations: Step-through pattern;Decreased stride length     General Gait Details: amb 167ft with RW, pt required standing rest break after 60ft, O2 dropped to 80% and HR increased to 176, returned to room and pt's vitals normalized after 3-4 mins seated rest break.  Stairs            Wheelchair Mobility    Modified Rankin (Stroke Patients Only)       Balance Overall balance assessment: Modified Independent                                           Pertinent Vitals/Pain Pain Assessment: No/denies pain    Home Living Family/patient expects to be discharged to:: Private residence Living Arrangements: Spouse/significant other Available Help at Discharge: Family Type of Home: House Home Access: Stairs to enter Entrance Stairs-Rails: Left Entrance Stairs-Number of Steps: 3 Home Layout: One level Home Equipment: Clinical cytogeneticist - 2 wheels;Crutches      Prior Function Level of Independence: Independent with assistive device(s)         Comments: Pt independent with household ambulation without AD, and independent with all ADLs and IADLs. Still driving. When in the community, would utilize  motorized scooters for community ambulation     Hand Dominance   Dominant Hand: Right    Extremity/Trunk Assessment   Upper Extremity Assessment Upper Extremity Assessment: Overall WFL for tasks assessed    Lower Extremity Assessment Lower Extremity Assessment: Generalized weakness    Cervical / Trunk Assessment Cervical / Trunk Assessment: Normal  Communication   Communication: No difficulties  Cognition Arousal/Alertness: Awake/alert Behavior During Therapy: WFL for tasks assessed/performed Overall Cognitive Status: Within Functional Limits for tasks assessed                                         General Comments General comments (skin integrity, edema, etc.): sitting balance good, pt standing balance good - fair as he was furniture walking prior to utilizing RW for rest of ambulation    Exercises     Assessment/Plan    PT Assessment Patient needs continued PT services  PT Problem List Decreased activity tolerance;Decreased knowledge of use of DME;Cardiopulmonary status limiting activity       PT Treatment Interventions DME instruction;Gait training;Functional mobility training;Therapeutic activities;Therapeutic exercise;Patient/family education    PT Goals (Current goals can be found in the Care Plan section)  Acute Rehab PT Goals Patient Stated Goal: to go home PT Goal Formulation: With patient Time For Goal Achievement: 12/10/16 Potential to Achieve Goals: Good    Frequency Min 2X/week   Barriers to discharge        Co-evaluation               AM-PAC PT "6 Clicks" Daily Activity  Outcome Measure Difficulty turning over in bed (including adjusting bedclothes, sheets and blankets)?: None Difficulty moving from lying on back to sitting on the side of the bed? : None Difficulty sitting down on and standing up from a chair with arms (e.g., wheelchair, bedside commode, etc,.)?: None Help needed moving to and from a bed to chair (including a wheelchair)?: A Little Help needed walking in hospital room?: A Little Help needed climbing 3-5 steps with a railing? : A Lot 6 Click Score: 20    End of Session Equipment Utilized During Treatment: Gait belt;Oxygen (3L) Activity Tolerance: Patient tolerated treatment well;No increased pain Patient left: in bed;with call bell/phone within reach Nurse Communication: Mobility status (mobility sheet left in room) PT Visit Diagnosis: Unsteadiness on feet (R26.81);Muscle weakness (generalized) (M62.81);Difficulty in walking, not elsewhere classified (R26.2)    Time: 6948-5462 PT Time  Calculation (min) (ACUTE ONLY): 19 min   Charges:   PT Evaluation $PT Eval Low Complexity: 1 Low     PT G Codes:   PT G-Codes **NOT FOR INPATIENT CLASS** Functional Assessment Tool Used: AM-PAC 6 Clicks Basic Mobility;Clinical judgement Functional Limitation: Mobility: Walking and moving around Mobility: Walking and Moving Around Current Status (V0350): At least 40 percent but less than 60 percent impaired, limited or restricted Mobility: Walking and Moving Around Goal Status [phone]): At least 20 percent but less than 40 percent impaired, limited or restricted     Geraldine Solar PT, DPT

## 2016-12-05 NOTE — Care Management Obs Status (Signed)
[place] NOTIFICATION   Patient Details  Name: Justin Ruiz MRN: [id number] Date of Birth: 18-Mar-1951   Medicare Observation Status Notification Given:  Yes    Sherald Barge, RN 12/05/2016, 12:38 PM

## 2016-12-05 NOTE — Progress Notes (Signed)
CPAP 16 sleep study by clinton young. CPAP ramping from 5 to 16 with 4 lpm oxygen bled in

## 2016-12-05 NOTE — Care Management (Signed)
    Durable Medical Equipment        Start     Ordered   12/05/16 1229  For home use only DME Shower stool  Once     12/05/16 1229

## 2016-12-05 NOTE — Progress Notes (Signed)
ANTICOAGULATION CONSULT NOTE - Follow Up Consult  Pharmacy Consult for Coumadin (home med) Indication: atrial fibrillation  Allergies  Allergen Reactions  . Avapro [Irbesartan] Other (See Comments)    "doesnt sit right with me"--light headed  . Lipitor [Atorvastatin Calcium] Other (See Comments)    Leg pain   Patient Measurements: Height: 5\' 11"  (180.3 cm) Weight: 293 lb 14.4 oz (133.3 kg) IBW/kg (Calculated) : 75.3  Vital Signs: Temp: 98.3 F (36.8 C) (08/13 0613) Temp Source: Oral (08/13 0613) BP: 111/75 (08/13 5956) Pulse Rate: 84 (08/13 0613)  Labs:  Recent Labs  12/04/16 1445 12/04/16 2222 12/05/16 0623  HGB 12.8*  --  12.2*  HCT 39.7  --  37.4*  PLT 167  --  166  LABPROT  --  26.9* 27.2*  INR  --  2.43 2.47  CREATININE 0.94  --  0.89  TROPONINI <0.03  --   --    Estimated Creatinine Clearance: 115.3 mL/min (by C-G formula based on SCr of 0.89 mg/dL).  Medications:  Prescriptions Prior to Admission  Medication Sig Dispense Refill Last Dose  . acetaminophen (TYLENOL) 500 MG tablet Take 1,000 mg by mouth every 6 (six) hours as needed for headache. Reported on 10/08/2015   Past Month at Unknown time  . albuterol (PROAIR HFA) 108 (90 Base) MCG/ACT inhaler Inhale 2 puffs every 4-6 hours -rescue inhaler 8.5 g 12 Past Month at Unknown time  . ALPRAZolam (XANAX) 0.25 MG tablet Take 1 tablet (0.25 mg total) by mouth 3 (three) times daily as needed for anxiety. 90 tablet 1 12/03/2016 at Unknown time  . Blood Glucose Monitoring Suppl (CLEVER CHEK AUTO-CODE VOICE) DEVI USE AS DIRECTED 1 each 2 12/03/2016 at Unknown time  . Cholecalciferol (VITAMIN D3) 5000 UNITS TABS Take 1 tablet by mouth every morning.   12/04/2016 at 0930  . furosemide (LASIX) 40 MG tablet Take 1 tablet (40 mg total) by mouth 2 (two) times daily. As directed 60 tablet 6 12/04/2016 at 0930  . guaiFENesin (MUCINEX) 600 MG 12 hr tablet Take 600 mg by mouth 2 (two) times daily as needed for cough or to loosen  phlegm.   Past Month at Unknown time  . insulin NPH Human (NOVOLIN N) 100 UNIT/ML injection Inject 0.5 mLs (50 Units total) into the skin daily before breakfast. 20 mL 1 12/04/2016 at 0930  . insulin regular (NOVOLIN R,HUMULIN R) 250 units/2.68mL (100 units/mL) injection 6 units prior to each meal 10 mL 1 12/04/2016 at 0930  . ipratropium-albuterol (DUONEB) 0.5-2.5 (3) MG/3ML SOLN PLACE 1 VIAL (3 MLS) IN THE NEBULIZER EVERY 6 HOURS AS NEEDED 1080 mL 0 12/04/2016 at Unknown time  . Lancet Devices (LANCING DEVICE) MISC USE AS DIRECTED 1 each 2 12/03/2016 at Unknown time  . metFORMIN (GLUCOPHAGE) 500 MG tablet TAKE ONE TABLET BY MOUTH IN THE MORNING AND TAKE TWO TABLETS IN THE EVENING 270 tablet 0 12/04/2016 at 0930  . metoprolol (LOPRESSOR) 100 MG tablet Take 1 tablet (100 mg total) by mouth 2 (two) times daily. 180 tablet 3 12/04/2016 at 0930  . PARoxetine (PAXIL) 20 MG tablet Take 1 tablet (20 mg total) by mouth daily. 30 tablet 5 12/04/2016 at 0930  . simvastatin (ZOCOR) 40 MG tablet Take 1 tablet (40 mg total) by mouth daily. 90 tablet 3 12/03/2016 at Unknown time  . warfarin (COUMADIN) 5 MG tablet Take 1 tablet (5 mg total) by mouth daily. Or as directed by anticoagulation clinic (Patient taking differently: Take 5 mg by  mouth daily. On Monday, Wednesday, and Friday patient takes 2.5mg  On Tuesday, Thursday, Saturday, and Sunday he takes 5mg ) 90 tablet 0 12/04/2016 at 0930   Assessment: 66yo obese male on Coumadin PTA.  Asked to resume Warfarin for h/o afib.   INR is therapeutic.      Goal of Therapy:  INR 2-3 Monitor platelets by anticoagulation protocol: Yes   Plan:  Coumadin 5mg  today x 1 (home dose) Check INR daily  Hart Robinsons A 12/05/2016,7:36 AM

## 2016-12-05 NOTE — Discharge Summary (Signed)
Physician Discharge Summary  Justin Ruiz TML:[id number] DOB: [birth year] DOA: 12/04/2016  PCP: Chipper Herb, MD  Admit date: 12/04/2016 Discharge date: 12/05/2016  Recommendations for Outpatient Follow-up:  1. Follow-up COPD exacerbation. Message sent to the patient's primary pulmonologist for consideration and timing of outpatient follow-up.  Follow-up Information    Chipper Herb, MD.   Specialty:  Family Medicine Why:  Follow up Contact information: [place] [address] [phone]        Deneise Lever, MD Follow up.   Specialty:  Pulmonary Disease Why:  office will contact you for an appointment Contact information: [address]  [place] [address] [phone]        [facility] Follow up.   Contact information: [address] [place] [address] [phone]            Discharge Diagnoses:  1. Acute on chronic hypoxic respiratory failure 2. COPD exacerbation 3. Acute on chronic diastolic congestive heart failure 4. Atrial fibrillation 5. Diabetes mellitus type 2 6. Obesity  Discharge Condition: Improved Disposition: Home  Diet recommendation: Heart healthy, diabetic diet  Filed Weights   12/04/16 1438 12/04/16 2303 12/05/16 0613  Weight: 130.2 kg (287 lb) 133.1 kg (293 lb 8 oz) 133.3 kg (293 lb 14.4 oz)    History of present illness:  66 year old man PMH COPD, chronic hypoxic respiratory failure, diabetes, atrial fibrillation, presented with increasing shortness of breath. Admitted for acute on chronic hypoxic respiratory failure, acute COPD exacerbation, acute diastolic heart failure.  Hospital Course:  Patient was treated for COPD exacerbation, possible acute diastolic heart failure, with steroids, bronchodilators and diuresis, with rapid clinical improvement. Given his rapid improvement with only 1 dose of Lasix, it's not clear whether he had acute diastolic heart failure not, this is  somewhat doubtful at this point. Regardless hospitalization was uncomplicated and he was discharged home on a prednisone taper. Expect steroid-induced hyperglycemia will improve on oral steroids and returned to usual when steroids are tapered off. Atrial fibrillation, chronic hypoxic respiratory failure on 2. 5-3 liters per min nasal cannula remained stable during this hospitalization.  Today's assessment: S: Feels much better. Breathing much better. Feeling well enough to go home. O: Vitals: Afebrile, 98.3, 16, 84, 111/75, 96% on 3 L   Constitutional. Appears calm, comfortable.  Cardiovascular. Regular rate and rhythm. No murmur, rub or gallop. 1+ bilateral lower extremity edema.  Respiratory. Fair air movement. No frank wheezes, rales or rhonchi. Normal respiratory effort.  Psychiatric. Grossly normal mood and affect. Speech fluent and appropriate.  I have personally reviewed the following:  Urine output -850 Labs:  Hyperglycemic  Basic metabolic panel otherwise unremarkable  CBC unremarkable  INR 2.47  BNP 303  Troponin negative  D-dimer negative  Imaging studies:  Chest x-ray no acute disease  Medical tests:  EKG atrial fibrillation, rate-controlled, no acute changes    Discharge Instructions  Discharge Instructions    Activity as tolerated - No restrictions    Complete by:  As directed    Diet - low sodium heart healthy    Complete by:  As directed    Diet Carb Modified    Complete by:  As directed    Discharge instructions    Complete by:  As directed    Call your physician or seek immediate medical attention for wheezing, shortness of breath, pain or worsening of condition.     Allergies as of 12/05/2016  Reactions   Avapro [irbesartan] Other (See Comments)   "doesnt sit right with me"--light headed   Lipitor [atorvastatin Calcium] Other (See Comments)   Leg pain      Medication List    TAKE these medications   acetaminophen 500 MG  tablet Commonly known as:  TYLENOL Take 1,000 mg by mouth every 6 (six) hours as needed for headache. Reported on 10/08/2015   albuterol 108 (90 Base) MCG/ACT inhaler Commonly known as:  PROAIR HFA Inhale 2 puffs every 4-6 hours -rescue inhaler   ALPRAZolam 0.25 MG tablet Commonly known as:  XANAX Take 1 tablet (0.25 mg total) by mouth 3 (three) times daily as needed for anxiety.   CLEVER CHEK AUTO-CODE VOICE Devi USE AS DIRECTED   furosemide 40 MG tablet Commonly known as:  LASIX Take 1 tablet (40 mg total) by mouth 2 (two) times daily. As directed   guaiFENesin 600 MG 12 hr tablet Commonly known as:  MUCINEX Take 600 mg by mouth 2 (two) times daily as needed for cough or to loosen phlegm.   insulin NPH Human 100 UNIT/ML injection Commonly known as:  NOVOLIN N Inject 0.5 mLs (50 Units total) into the skin daily before breakfast.   insulin regular 100 units/mL injection Commonly known as:  NOVOLIN R,HUMULIN R 6 units prior to each meal   ipratropium-albuterol 0.5-2.5 (3) MG/3ML Soln Commonly known as:  DUONEB PLACE 1 VIAL (3 MLS) IN THE NEBULIZER EVERY 6 HOURS AS NEEDED   Lancing Device Misc USE AS DIRECTED   metFORMIN 500 MG tablet Commonly known as:  GLUCOPHAGE TAKE ONE TABLET BY MOUTH IN THE MORNING AND TAKE TWO TABLETS IN THE EVENING   metoprolol tartrate 100 MG tablet Commonly known as:  LOPRESSOR Take 1 tablet (100 mg total) by mouth 2 (two) times daily.   PARoxetine 20 MG tablet Commonly known as:  PAXIL Take 1 tablet (20 mg total) by mouth daily.   predniSONE 20 MG tablet Commonly known as:  DELTASONE Take daily by mouth: 40 mg x3 days, then 20 mg x3 days, then 10 mg x3 days, then stop.   simvastatin 40 MG tablet Commonly known as:  ZOCOR Take 1 tablet (40 mg total) by mouth daily.   Vitamin D3 5000 units Tabs Take 1 tablet by mouth every morning.   warfarin 5 MG tablet Commonly known as:  COUMADIN Take 1 tablet (5 mg total) by mouth daily. Or as  directed by anticoagulation clinic What changed:  additional instructions            Durable Medical Equipment        Start     Ordered   12/05/16 1229  For home use only DME Shower stool  Once     12/05/16 1229     Allergies  Allergen Reactions  . Avapro [Irbesartan] Other (See Comments)    "doesnt sit right with me"--light headed  . Lipitor [Atorvastatin Calcium] Other (See Comments)    Leg pain    The results of significant diagnostics from this hospitalization (including imaging, microbiology, ancillary and laboratory) are listed below for reference.    Significant Diagnostic Studies: Dg Chest Portable 1 View  Result Date: 12/04/2016 CLINICAL DATA:  Shortness of breath with cough EXAM: PORTABLE CHEST 1 VIEW COMPARISON:  10/19/2016 FINDINGS: The heart size and mediastinal contours are within normal limits. Both lungs are clear. The visualized skeletal structures are unremarkable. IMPRESSION: No active disease. Electronically Signed   By: Linus Mako.D.  On: 12/04/2016 15:11     Labs: Basic Metabolic Panel:  Recent Labs Lab 12/04/16 1445 12/05/16 0623  NA 140 136  K 3.8 4.2  CL 93* 88*  CO2 36* 37*  GLUCOSE 269* 391*  BUN 18 22*  CREATININE 0.94 0.89  CALCIUM 8.8* 8.8*   CBC:  Recent Labs Lab 12/04/16 1445 12/05/16 0623  WBC 6.4 6.2  NEUTROABS 4.8 5.5  HGB 12.8* 12.2*  HCT 39.7 37.4*  MCV 94.1 93.3  PLT 167 166   Cardiac Enzymes:  Recent Labs Lab 12/04/16 1445  TROPONINI <0.03     Recent Labs  03/28/16 1117 10/19/16 1754 12/04/16 1445  BNP 475.0* 107.5* 303.0*    CBG:  Recent Labs Lab 12/04/16 1834 12/05/16 0724 12/05/16 1044 12/05/16 1117 12/05/16 1621  GLUCAP 253* 393* 441* 395* 240*    Principal Problem:   Acute on chronic respiratory failure with hypoxia (HCC) Active Problems:   Diabetes mellitus type 2, insulin dependent (HCC)   Hypertension   COPD exacerbation (HCC)   Chronic anticoagulation   Obstructive  sleep apnea   Acute diastolic heart failure (HCC)   Atrial fibrillation with RVR ([place])   Time coordinating discharge: 35 minutes  Signed:  Murray Hodgkins, MD [facility] 12/05/2016, 6:28 PM

## 2016-12-05 NOTE — Clinical Social Work Note (Signed)
Patient referred for COPD GOLD Protocol. Patient does not have the required three admissions for COPD Gold Protocol.     LCSW signing off.      Mirranda Monrroy, Clydene Pugh, LCSW

## 2016-12-05 NOTE — Care Management Note (Addendum)
Case Management Note  Patient Details  Name: Justin Ruiz MRN: [id number] Date of Birth: [birth year]  Subjective/Objective:                  Admitted with resp failure. Pt is from home, lives with his wife. He is ind with ADL's. He has PCP, drives himself to appointments. He has insurance with drug coverage. Pt has CPAP, home oxygen and neb machine from [facility]. He needs shower stool and HH RN/PT. He would like HH and DME from [facility].   Action/Plan: Thomasenia Sales of [facility], aware of referral. Pt will have to pick shower stool up in store. CM will update [facility] on DC date.   Expected Discharge Date:  12/05/16               Expected Discharge Plan:  [place]  In-House Referral:  NA  Discharge planning Services  CM Consult  Post Acute Care Choice:  Durable Medical Equipment, Home Health Choice offered to:  Patient  DME Arranged:  Shower stool DME Agency:  [facility] [facility] Arranged:  RN, PT [facility] Agency:    [facility]  Status of Service:  In process, will continue to follow   Addendum: pt will DC home today. [facility] aware of DC today. Pt aware HH has 48hrs to make first visit.   Sherald Barge, RN 12/05/2016, 3:10 PM

## 2016-12-05 NOTE — Progress Notes (Signed)
Initial Nutrition Assessment  DOCUMENTATION CODES:  Obesity class III    INTERVENTION:  Heart healthy CHO modified diet   Provided heart healthy diet review and healthy wt loss handout   NUTRITION DIAGNOSIS:    Knowledge deficit related to heart healthy diet evidenced by pt questions    GOAL:  Pt to meet >/= 90% of their estimated nutrition needs       MONITOR:  Po intake, labs and wt trends      REASON FOR ASSESSMENT:   Consult Assessment of nutrition requirement/status  ASSESSMENT:  The patient is from home. Hx of  DM-2, obesity, HLD, COPD, GERD. He presents with shortness of breath acute respiratory distress. He is better today and is being discharged. He is concerned about following a low sodium diet and has several questions around recommendations. Diabetes was not discussed.    RD provided review of common sources of high sodium foods and encouraged pt to educate himself about the foods he consistently chooses to eat. Weight loss of 5-10% is recommended and a handout was provided with specifics.    Recent Labs Lab 12/04/16 1445 12/05/16 0623  NA 140 136  K 3.8 4.2  CL 93* 88*  CO2 36* 37*  BUN 18 22*  CREATININE 0.94 0.89  CALCIUM 8.8* 8.8*  GLUCOSE 269* 391*   CBG (last 3)   Recent Labs  12/05/16 0724 12/05/16 1044 12/05/16 1117  GLUCAP 393* 441* 395*    Labs reviewed hyperglycemia.   Meds: SSI, Lasix, Coumadin   Diet Order:  Diet heart healthy/carb modified Room service appropriate? Yes; Fluid consistency: Thin  Skin:  Reviewed, no issues  Last BM:   8/13   Height:   Ht Readings from Last 1 Encounters:  12/04/16 5\' 11"  (1.803 m)    Weight:   Wt Readings from Last 1 Encounters:  12/05/16 293 lb 14.4 oz (133.3 kg)    Ideal Body Weight:  78 kg  BMI:  Body mass index is 40.99 kg/m.  Estimated Nutritional Needs:   Kcal:  2100-2390   Protein:  120-140 gr  Fluid:  2.1-2.4 liters daily  EDUCATION NEEDS: addressed   Colman Cater MS,RD,CSG,LDN Office: [phone] Pager: [phone]

## 2016-12-05 NOTE — Progress Notes (Signed)
Discharge instructions read to patient and his wife by Tawnya Crook RN Pt and wife verbalized understanding of all instructions Discharge to home with wife

## 2016-12-06 DIAGNOSIS — I5031 Acute diastolic (congestive) heart failure: Secondary | ICD-10-CM | POA: Diagnosis not present

## 2016-12-06 DIAGNOSIS — J9621 Acute and chronic respiratory failure with hypoxia: Secondary | ICD-10-CM | POA: Diagnosis not present

## 2016-12-06 DIAGNOSIS — G4733 Obstructive sleep apnea (adult) (pediatric): Secondary | ICD-10-CM | POA: Diagnosis not present

## 2016-12-06 DIAGNOSIS — E119 Type 2 diabetes mellitus without complications: Secondary | ICD-10-CM | POA: Diagnosis not present

## 2016-12-06 DIAGNOSIS — M1990 Unspecified osteoarthritis, unspecified site: Secondary | ICD-10-CM | POA: Diagnosis not present

## 2016-12-06 DIAGNOSIS — J441 Chronic obstructive pulmonary disease with (acute) exacerbation: Secondary | ICD-10-CM | POA: Diagnosis not present

## 2016-12-06 DIAGNOSIS — I110 Hypertensive heart disease with heart failure: Secondary | ICD-10-CM | POA: Diagnosis not present

## 2016-12-06 DIAGNOSIS — I4891 Unspecified atrial fibrillation: Secondary | ICD-10-CM | POA: Diagnosis not present

## 2016-12-06 DIAGNOSIS — J45909 Unspecified asthma, uncomplicated: Secondary | ICD-10-CM | POA: Diagnosis not present

## 2016-12-06 LAB — HEMOGLOBIN A1C
HEMOGLOBIN A1C: 8.1 % — AB (ref 4.8–5.6)
Mean Plasma Glucose: 186 mg/dL

## 2016-12-09 ENCOUNTER — Ambulatory Visit: Payer: Self-pay | Admitting: Pharmacist Clinician (PhC)/ Clinical Pharmacy Specialist

## 2016-12-09 ENCOUNTER — Telehealth: Payer: Self-pay | Admitting: *Deleted

## 2016-12-09 DIAGNOSIS — I5031 Acute diastolic (congestive) heart failure: Secondary | ICD-10-CM | POA: Diagnosis not present

## 2016-12-09 DIAGNOSIS — Z7901 Long term (current) use of anticoagulants: Secondary | ICD-10-CM

## 2016-12-09 DIAGNOSIS — J441 Chronic obstructive pulmonary disease with (acute) exacerbation: Secondary | ICD-10-CM | POA: Diagnosis not present

## 2016-12-09 DIAGNOSIS — I4891 Unspecified atrial fibrillation: Secondary | ICD-10-CM | POA: Diagnosis not present

## 2016-12-09 DIAGNOSIS — J9611 Chronic respiratory failure with hypoxia: Secondary | ICD-10-CM | POA: Diagnosis not present

## 2016-12-09 DIAGNOSIS — J439 Emphysema, unspecified: Secondary | ICD-10-CM | POA: Diagnosis not present

## 2016-12-09 DIAGNOSIS — I110 Hypertensive heart disease with heart failure: Secondary | ICD-10-CM | POA: Diagnosis not present

## 2016-12-09 DIAGNOSIS — E119 Type 2 diabetes mellitus without complications: Secondary | ICD-10-CM | POA: Diagnosis not present

## 2016-12-09 DIAGNOSIS — J449 Chronic obstructive pulmonary disease, unspecified: Secondary | ICD-10-CM | POA: Diagnosis not present

## 2016-12-09 DIAGNOSIS — J45909 Unspecified asthma, uncomplicated: Secondary | ICD-10-CM | POA: Diagnosis not present

## 2016-12-09 DIAGNOSIS — M1990 Unspecified osteoarthritis, unspecified site: Secondary | ICD-10-CM | POA: Diagnosis not present

## 2016-12-09 DIAGNOSIS — J9621 Acute and chronic respiratory failure with hypoxia: Secondary | ICD-10-CM | POA: Diagnosis not present

## 2016-12-09 DIAGNOSIS — G4733 Obstructive sleep apnea (adult) (pediatric): Secondary | ICD-10-CM | POA: Diagnosis not present

## 2016-12-09 LAB — POCT INR: INR: 2.7

## 2016-12-09 NOTE — Telephone Encounter (Signed)
PT 31.8 INR 2.7 Takes 5 mg daily in am's Has not taken today's dosage yet Please Advise

## 2016-12-12 ENCOUNTER — Telehealth: Payer: Self-pay | Admitting: *Deleted

## 2016-12-12 DIAGNOSIS — M1990 Unspecified osteoarthritis, unspecified site: Secondary | ICD-10-CM | POA: Diagnosis not present

## 2016-12-12 DIAGNOSIS — E119 Type 2 diabetes mellitus without complications: Secondary | ICD-10-CM | POA: Diagnosis not present

## 2016-12-12 DIAGNOSIS — J45909 Unspecified asthma, uncomplicated: Secondary | ICD-10-CM | POA: Diagnosis not present

## 2016-12-12 DIAGNOSIS — I110 Hypertensive heart disease with heart failure: Secondary | ICD-10-CM | POA: Diagnosis not present

## 2016-12-12 DIAGNOSIS — I4891 Unspecified atrial fibrillation: Secondary | ICD-10-CM | POA: Diagnosis not present

## 2016-12-12 DIAGNOSIS — G4733 Obstructive sleep apnea (adult) (pediatric): Secondary | ICD-10-CM | POA: Diagnosis not present

## 2016-12-12 DIAGNOSIS — I5031 Acute diastolic (congestive) heart failure: Secondary | ICD-10-CM | POA: Diagnosis not present

## 2016-12-12 DIAGNOSIS — J441 Chronic obstructive pulmonary disease with (acute) exacerbation: Secondary | ICD-10-CM | POA: Diagnosis not present

## 2016-12-12 DIAGNOSIS — J9621 Acute and chronic respiratory failure with hypoxia: Secondary | ICD-10-CM | POA: Diagnosis not present

## 2016-12-12 LAB — POCT INR
INR: 2.6
INR: 2.6 — AB (ref 0.9–1.1)

## 2016-12-12 NOTE — Telephone Encounter (Signed)
INR 2.6

## 2016-12-13 ENCOUNTER — Ambulatory Visit (INDEPENDENT_AMBULATORY_CARE_PROVIDER_SITE_OTHER): Payer: Medicare HMO | Admitting: *Deleted

## 2016-12-13 DIAGNOSIS — Z7901 Long term (current) use of anticoagulants: Secondary | ICD-10-CM

## 2016-12-13 DIAGNOSIS — I4891 Unspecified atrial fibrillation: Secondary | ICD-10-CM

## 2016-12-13 DIAGNOSIS — G4733 Obstructive sleep apnea (adult) (pediatric): Secondary | ICD-10-CM | POA: Diagnosis not present

## 2016-12-13 NOTE — Progress Notes (Signed)
Subjective:     Indication: atrial fibrillation Bleeding signs/symptoms: None Thromboembolic signs/symptoms: None  Missed Coumadin doses: None Medication changes: no Dietary changes: no Bacterial/viral infection: no Other concerns: no  Results and findings called by [facility] Nurse-Tina  Objective:    INR Today: 2.6 Current dose: 5 mg daily    Assessment:    Therapeutic INR for goal of 2-3   Plan:    1. New dose: no change   2. Next INR: 1 week  rck by home health nurse due to fluctuations in results last month   [name], RN  Agreed [place], [place]

## 2016-12-14 ENCOUNTER — Ambulatory Visit: Payer: Medicare HMO | Admitting: Cardiology

## 2016-12-14 DIAGNOSIS — J45909 Unspecified asthma, uncomplicated: Secondary | ICD-10-CM | POA: Diagnosis not present

## 2016-12-14 DIAGNOSIS — J441 Chronic obstructive pulmonary disease with (acute) exacerbation: Secondary | ICD-10-CM | POA: Diagnosis not present

## 2016-12-14 DIAGNOSIS — J9621 Acute and chronic respiratory failure with hypoxia: Secondary | ICD-10-CM | POA: Diagnosis not present

## 2016-12-14 DIAGNOSIS — I4891 Unspecified atrial fibrillation: Secondary | ICD-10-CM | POA: Diagnosis not present

## 2016-12-14 DIAGNOSIS — M1990 Unspecified osteoarthritis, unspecified site: Secondary | ICD-10-CM | POA: Diagnosis not present

## 2016-12-14 DIAGNOSIS — I5031 Acute diastolic (congestive) heart failure: Secondary | ICD-10-CM | POA: Diagnosis not present

## 2016-12-14 DIAGNOSIS — I110 Hypertensive heart disease with heart failure: Secondary | ICD-10-CM | POA: Diagnosis not present

## 2016-12-14 DIAGNOSIS — G4733 Obstructive sleep apnea (adult) (pediatric): Secondary | ICD-10-CM | POA: Diagnosis not present

## 2016-12-14 DIAGNOSIS — E119 Type 2 diabetes mellitus without complications: Secondary | ICD-10-CM | POA: Diagnosis not present

## 2016-12-15 ENCOUNTER — Telehealth: Payer: Self-pay | Admitting: Family Medicine

## 2016-12-15 DIAGNOSIS — I4891 Unspecified atrial fibrillation: Secondary | ICD-10-CM | POA: Diagnosis not present

## 2016-12-15 DIAGNOSIS — M1990 Unspecified osteoarthritis, unspecified site: Secondary | ICD-10-CM | POA: Diagnosis not present

## 2016-12-15 DIAGNOSIS — I5031 Acute diastolic (congestive) heart failure: Secondary | ICD-10-CM | POA: Diagnosis not present

## 2016-12-15 DIAGNOSIS — J45909 Unspecified asthma, uncomplicated: Secondary | ICD-10-CM | POA: Diagnosis not present

## 2016-12-15 DIAGNOSIS — E1142 Type 2 diabetes mellitus with diabetic polyneuropathy: Secondary | ICD-10-CM | POA: Diagnosis not present

## 2016-12-15 DIAGNOSIS — B351 Tinea unguium: Secondary | ICD-10-CM | POA: Diagnosis not present

## 2016-12-15 DIAGNOSIS — G4733 Obstructive sleep apnea (adult) (pediatric): Secondary | ICD-10-CM | POA: Diagnosis not present

## 2016-12-15 DIAGNOSIS — I110 Hypertensive heart disease with heart failure: Secondary | ICD-10-CM | POA: Diagnosis not present

## 2016-12-15 DIAGNOSIS — E119 Type 2 diabetes mellitus without complications: Secondary | ICD-10-CM | POA: Diagnosis not present

## 2016-12-15 DIAGNOSIS — J9621 Acute and chronic respiratory failure with hypoxia: Secondary | ICD-10-CM | POA: Diagnosis not present

## 2016-12-15 DIAGNOSIS — M79676 Pain in unspecified toe(s): Secondary | ICD-10-CM | POA: Diagnosis not present

## 2016-12-15 DIAGNOSIS — J441 Chronic obstructive pulmonary disease with (acute) exacerbation: Secondary | ICD-10-CM | POA: Diagnosis not present

## 2016-12-15 NOTE — Telephone Encounter (Signed)
Demographic sheet, med list and insurance card faxed to Dr. Steffanie Rainwater [phone].

## 2016-12-19 DIAGNOSIS — J45909 Unspecified asthma, uncomplicated: Secondary | ICD-10-CM | POA: Diagnosis not present

## 2016-12-19 DIAGNOSIS — E119 Type 2 diabetes mellitus without complications: Secondary | ICD-10-CM | POA: Diagnosis not present

## 2016-12-19 DIAGNOSIS — M1990 Unspecified osteoarthritis, unspecified site: Secondary | ICD-10-CM | POA: Diagnosis not present

## 2016-12-19 DIAGNOSIS — I5031 Acute diastolic (congestive) heart failure: Secondary | ICD-10-CM | POA: Diagnosis not present

## 2016-12-19 DIAGNOSIS — J441 Chronic obstructive pulmonary disease with (acute) exacerbation: Secondary | ICD-10-CM | POA: Diagnosis not present

## 2016-12-19 DIAGNOSIS — G4733 Obstructive sleep apnea (adult) (pediatric): Secondary | ICD-10-CM | POA: Diagnosis not present

## 2016-12-19 DIAGNOSIS — I110 Hypertensive heart disease with heart failure: Secondary | ICD-10-CM | POA: Diagnosis not present

## 2016-12-19 DIAGNOSIS — I4891 Unspecified atrial fibrillation: Secondary | ICD-10-CM | POA: Diagnosis not present

## 2016-12-19 DIAGNOSIS — J9621 Acute and chronic respiratory failure with hypoxia: Secondary | ICD-10-CM | POA: Diagnosis not present

## 2016-12-20 ENCOUNTER — Telehealth: Payer: Self-pay | Admitting: *Deleted

## 2016-12-20 DIAGNOSIS — I110 Hypertensive heart disease with heart failure: Secondary | ICD-10-CM | POA: Diagnosis not present

## 2016-12-20 DIAGNOSIS — G4733 Obstructive sleep apnea (adult) (pediatric): Secondary | ICD-10-CM | POA: Diagnosis not present

## 2016-12-20 DIAGNOSIS — J441 Chronic obstructive pulmonary disease with (acute) exacerbation: Secondary | ICD-10-CM | POA: Diagnosis not present

## 2016-12-20 DIAGNOSIS — J9621 Acute and chronic respiratory failure with hypoxia: Secondary | ICD-10-CM | POA: Diagnosis not present

## 2016-12-20 DIAGNOSIS — E119 Type 2 diabetes mellitus without complications: Secondary | ICD-10-CM | POA: Diagnosis not present

## 2016-12-20 DIAGNOSIS — I5031 Acute diastolic (congestive) heart failure: Secondary | ICD-10-CM | POA: Diagnosis not present

## 2016-12-20 DIAGNOSIS — I4891 Unspecified atrial fibrillation: Secondary | ICD-10-CM | POA: Diagnosis not present

## 2016-12-20 DIAGNOSIS — J45909 Unspecified asthma, uncomplicated: Secondary | ICD-10-CM | POA: Diagnosis not present

## 2016-12-20 DIAGNOSIS — M1990 Unspecified osteoarthritis, unspecified site: Secondary | ICD-10-CM | POA: Diagnosis not present

## 2016-12-20 NOTE — Telephone Encounter (Signed)
If the patient has not skipped are missed any dosages of 5 mg daily, please have him take an extra half pills or 2-1/2 mg today and tomorrow and then after tomorrow resume previous dose at 5 mg daily and recheck a pro time in one week.

## 2016-12-20 NOTE — Telephone Encounter (Signed)
[facility] PT 19.3 INR 1.6

## 2016-12-20 NOTE — Telephone Encounter (Signed)
[facility] - kathy aware as well

## 2016-12-20 NOTE — Telephone Encounter (Signed)
Please confirm with patient how he is currently taking his Coumadin and the milligrams per day. Always like to double check on this before I make any changes.

## 2016-12-20 NOTE — Telephone Encounter (Signed)
Pt wife aware

## 2016-12-20 NOTE — Telephone Encounter (Signed)
Pt is taking coumadin 5 mg 1 tab daily - 7 days a week.

## 2016-12-22 DIAGNOSIS — I4891 Unspecified atrial fibrillation: Secondary | ICD-10-CM | POA: Diagnosis not present

## 2016-12-22 DIAGNOSIS — I110 Hypertensive heart disease with heart failure: Secondary | ICD-10-CM | POA: Diagnosis not present

## 2016-12-22 DIAGNOSIS — M1990 Unspecified osteoarthritis, unspecified site: Secondary | ICD-10-CM | POA: Diagnosis not present

## 2016-12-22 DIAGNOSIS — I5031 Acute diastolic (congestive) heart failure: Secondary | ICD-10-CM | POA: Diagnosis not present

## 2016-12-22 DIAGNOSIS — G4733 Obstructive sleep apnea (adult) (pediatric): Secondary | ICD-10-CM | POA: Diagnosis not present

## 2016-12-22 DIAGNOSIS — J45909 Unspecified asthma, uncomplicated: Secondary | ICD-10-CM | POA: Diagnosis not present

## 2016-12-22 DIAGNOSIS — J9621 Acute and chronic respiratory failure with hypoxia: Secondary | ICD-10-CM | POA: Diagnosis not present

## 2016-12-22 DIAGNOSIS — E119 Type 2 diabetes mellitus without complications: Secondary | ICD-10-CM | POA: Diagnosis not present

## 2016-12-22 DIAGNOSIS — J441 Chronic obstructive pulmonary disease with (acute) exacerbation: Secondary | ICD-10-CM | POA: Diagnosis not present

## 2016-12-23 ENCOUNTER — Ambulatory Visit (INDEPENDENT_AMBULATORY_CARE_PROVIDER_SITE_OTHER): Payer: Medicare HMO | Admitting: Family Medicine

## 2016-12-23 DIAGNOSIS — I5031 Acute diastolic (congestive) heart failure: Secondary | ICD-10-CM | POA: Diagnosis not present

## 2016-12-23 DIAGNOSIS — E119 Type 2 diabetes mellitus without complications: Secondary | ICD-10-CM

## 2016-12-23 DIAGNOSIS — I110 Hypertensive heart disease with heart failure: Secondary | ICD-10-CM

## 2016-12-23 DIAGNOSIS — J441 Chronic obstructive pulmonary disease with (acute) exacerbation: Secondary | ICD-10-CM

## 2016-12-24 DIAGNOSIS — J441 Chronic obstructive pulmonary disease with (acute) exacerbation: Secondary | ICD-10-CM | POA: Diagnosis not present

## 2016-12-24 DIAGNOSIS — I5040 Unspecified combined systolic (congestive) and diastolic (congestive) heart failure: Secondary | ICD-10-CM | POA: Diagnosis not present

## 2016-12-27 ENCOUNTER — Ambulatory Visit (INDEPENDENT_AMBULATORY_CARE_PROVIDER_SITE_OTHER): Payer: Medicare HMO | Admitting: *Deleted

## 2016-12-27 DIAGNOSIS — J441 Chronic obstructive pulmonary disease with (acute) exacerbation: Secondary | ICD-10-CM | POA: Diagnosis not present

## 2016-12-27 DIAGNOSIS — E119 Type 2 diabetes mellitus without complications: Secondary | ICD-10-CM | POA: Diagnosis not present

## 2016-12-27 DIAGNOSIS — I4891 Unspecified atrial fibrillation: Secondary | ICD-10-CM | POA: Diagnosis not present

## 2016-12-27 DIAGNOSIS — Z7901 Long term (current) use of anticoagulants: Secondary | ICD-10-CM

## 2016-12-27 DIAGNOSIS — I5031 Acute diastolic (congestive) heart failure: Secondary | ICD-10-CM | POA: Diagnosis not present

## 2016-12-27 DIAGNOSIS — J9621 Acute and chronic respiratory failure with hypoxia: Secondary | ICD-10-CM | POA: Diagnosis not present

## 2016-12-27 DIAGNOSIS — G4733 Obstructive sleep apnea (adult) (pediatric): Secondary | ICD-10-CM | POA: Diagnosis not present

## 2016-12-27 DIAGNOSIS — J45909 Unspecified asthma, uncomplicated: Secondary | ICD-10-CM | POA: Diagnosis not present

## 2016-12-27 DIAGNOSIS — I110 Hypertensive heart disease with heart failure: Secondary | ICD-10-CM | POA: Diagnosis not present

## 2016-12-27 DIAGNOSIS — M1990 Unspecified osteoarthritis, unspecified site: Secondary | ICD-10-CM | POA: Diagnosis not present

## 2016-12-27 LAB — POCT INR: INR: 2 — AB (ref <=1.1)

## 2016-12-27 NOTE — Progress Notes (Signed)
TC from [place] at [facility] Todays INR 2.0 Takes 5 mgs daily in am

## 2016-12-27 NOTE — Progress Notes (Signed)
Subjective:     Indication: atrial fibrillation Bleeding signs/symptoms: None Thromboembolic signs/symptoms: None  Missed Coumadin doses: None Medication changes: no Dietary changes: no Bacterial/viral infection: no Other concerns: no   Objective:    INR Today: 2.0 reported by Otila Kluver with [facility] Current dose: 5mg  daily    Assessment:    Therapeutic INR for goal of 2-3   Plan:    1. New dose: no change   2. Next INR: 1 week . Since dose was adjusted 2 weeks ago I would like to make sure that he is stable before stretching out the rechecks further.  [name], RN Agree  Mary-Margaret Hassell Done, [place]

## 2016-12-27 NOTE — Addendum Note (Signed)
Addended by: Ilean China on: 12/27/2016 11:30 AM   Modules accepted: Level of Service

## 2016-12-28 ENCOUNTER — Inpatient Hospital Stay: Payer: Medicare HMO | Admitting: Internal Medicine

## 2017-01-05 ENCOUNTER — Ambulatory Visit (INDEPENDENT_AMBULATORY_CARE_PROVIDER_SITE_OTHER): Payer: Medicare HMO | Admitting: *Deleted

## 2017-01-05 DIAGNOSIS — G4733 Obstructive sleep apnea (adult) (pediatric): Secondary | ICD-10-CM | POA: Diagnosis not present

## 2017-01-05 DIAGNOSIS — J441 Chronic obstructive pulmonary disease with (acute) exacerbation: Secondary | ICD-10-CM | POA: Diagnosis not present

## 2017-01-05 DIAGNOSIS — E119 Type 2 diabetes mellitus without complications: Secondary | ICD-10-CM | POA: Diagnosis not present

## 2017-01-05 DIAGNOSIS — I5031 Acute diastolic (congestive) heart failure: Secondary | ICD-10-CM | POA: Diagnosis not present

## 2017-01-05 DIAGNOSIS — J449 Chronic obstructive pulmonary disease, unspecified: Secondary | ICD-10-CM | POA: Diagnosis not present

## 2017-01-05 DIAGNOSIS — I110 Hypertensive heart disease with heart failure: Secondary | ICD-10-CM | POA: Diagnosis not present

## 2017-01-05 DIAGNOSIS — M1990 Unspecified osteoarthritis, unspecified site: Secondary | ICD-10-CM | POA: Diagnosis not present

## 2017-01-05 DIAGNOSIS — M6281 Muscle weakness (generalized): Secondary | ICD-10-CM | POA: Diagnosis not present

## 2017-01-05 DIAGNOSIS — I5040 Unspecified combined systolic (congestive) and diastolic (congestive) heart failure: Secondary | ICD-10-CM | POA: Diagnosis not present

## 2017-01-05 DIAGNOSIS — I4891 Unspecified atrial fibrillation: Secondary | ICD-10-CM

## 2017-01-05 DIAGNOSIS — R531 Weakness: Secondary | ICD-10-CM | POA: Diagnosis not present

## 2017-01-05 DIAGNOSIS — J9621 Acute and chronic respiratory failure with hypoxia: Secondary | ICD-10-CM | POA: Diagnosis not present

## 2017-01-05 DIAGNOSIS — J45909 Unspecified asthma, uncomplicated: Secondary | ICD-10-CM | POA: Diagnosis not present

## 2017-01-05 DIAGNOSIS — Z7901 Long term (current) use of anticoagulants: Secondary | ICD-10-CM

## 2017-01-05 LAB — POCT INR: INR: 2.6 — AB (ref 0.9–1.1)

## 2017-01-05 NOTE — Progress Notes (Signed)
PT 30.9 INR Coumadin 5 mg daily

## 2017-01-05 NOTE — Addendum Note (Signed)
Addended by: Ilean China on: 01/05/2017 04:52 PM   Modules accepted: Level of Service

## 2017-01-05 NOTE — Progress Notes (Signed)
Subjective:     Indication: atrial fibrillation Bleeding signs/symptoms: None Thromboembolic signs/symptoms: None  Missed Coumadin doses: None Medication changes: no Dietary changes: no Bacterial/viral infection: no Other concerns: no    Objective:    INR Today: 2.6 Current dose: 5mg  daily  Assessment:    Therapeutic INR for goal of 2-3   Plan:    1. New dose: no change   2. Next INR: 2 weeks    [name], RN Agree  Mary-Margaret Hassell Done, FNP

## 2017-01-09 DIAGNOSIS — J9611 Chronic respiratory failure with hypoxia: Secondary | ICD-10-CM | POA: Diagnosis not present

## 2017-01-09 DIAGNOSIS — J449 Chronic obstructive pulmonary disease, unspecified: Secondary | ICD-10-CM | POA: Diagnosis not present

## 2017-01-09 DIAGNOSIS — J439 Emphysema, unspecified: Secondary | ICD-10-CM | POA: Diagnosis not present

## 2017-01-16 DIAGNOSIS — M1990 Unspecified osteoarthritis, unspecified site: Secondary | ICD-10-CM | POA: Diagnosis not present

## 2017-01-16 DIAGNOSIS — J441 Chronic obstructive pulmonary disease with (acute) exacerbation: Secondary | ICD-10-CM | POA: Diagnosis not present

## 2017-01-16 DIAGNOSIS — J9621 Acute and chronic respiratory failure with hypoxia: Secondary | ICD-10-CM | POA: Diagnosis not present

## 2017-01-16 DIAGNOSIS — I5031 Acute diastolic (congestive) heart failure: Secondary | ICD-10-CM | POA: Diagnosis not present

## 2017-01-16 DIAGNOSIS — G4733 Obstructive sleep apnea (adult) (pediatric): Secondary | ICD-10-CM | POA: Diagnosis not present

## 2017-01-16 DIAGNOSIS — I110 Hypertensive heart disease with heart failure: Secondary | ICD-10-CM | POA: Diagnosis not present

## 2017-01-16 DIAGNOSIS — E119 Type 2 diabetes mellitus without complications: Secondary | ICD-10-CM | POA: Diagnosis not present

## 2017-01-16 DIAGNOSIS — I4891 Unspecified atrial fibrillation: Secondary | ICD-10-CM | POA: Diagnosis not present

## 2017-01-16 DIAGNOSIS — J45909 Unspecified asthma, uncomplicated: Secondary | ICD-10-CM | POA: Diagnosis not present

## 2017-01-23 DIAGNOSIS — I5040 Unspecified combined systolic (congestive) and diastolic (congestive) heart failure: Secondary | ICD-10-CM | POA: Diagnosis not present

## 2017-01-23 DIAGNOSIS — J441 Chronic obstructive pulmonary disease with (acute) exacerbation: Secondary | ICD-10-CM | POA: Diagnosis not present

## 2017-01-24 ENCOUNTER — Ambulatory Visit (INDEPENDENT_AMBULATORY_CARE_PROVIDER_SITE_OTHER): Payer: Medicare HMO | Admitting: *Deleted

## 2017-01-24 DIAGNOSIS — Z7901 Long term (current) use of anticoagulants: Secondary | ICD-10-CM | POA: Diagnosis not present

## 2017-01-24 DIAGNOSIS — Z23 Encounter for immunization: Secondary | ICD-10-CM | POA: Diagnosis not present

## 2017-01-24 DIAGNOSIS — I4891 Unspecified atrial fibrillation: Secondary | ICD-10-CM

## 2017-01-24 LAB — COAGUCHEK XS/INR WAIVED
INR: 3.5 — AB (ref 0.9–1.1)
Prothrombin Time: 42.5 s

## 2017-01-24 NOTE — Patient Instructions (Addendum)
Anticoagulation Warfarin Dose Instructions as of 01/24/2017      Dorene Grebe Tue Wed Thu Fri Sat   New Dose 5 mg 5 mg 2.5 mg 5 mg 5 mg 5 mg 5 mg    Description   Take 1/2 (2.5mg ) tablet on Tuesdays and 1 whole tablet (5mg ) all other days.  Your INR was 3.5 today This is higher than your goal of 2.0 - 3.0  Return on 02/21/17 at 8:00    *-

## 2017-01-24 NOTE — Progress Notes (Signed)
Subjective:     Indication: atrial fibrillation Bleeding signs/symptoms: None Thromboembolic signs/symptoms: None  Missed Coumadin doses: None Medication changes: no Dietary changes: no Bacterial/viral infection: no Other concerns: no  The following portions of the patient's history were reviewed and updated as appropriate: allergies and current medications.  Review of Systems Pertinent items are noted in HPI.   Objective:    INR Today: 3.5 Current dose: 5 mg daily    Assessment:    Supratherapeutic INR for goal of 2-3   Plan:    1. New dose: 2.5mg  on Tuesdays and 5mg  all other days   2. Next INR: 1 month    Discussed with Laroy Apple, MD  [name], RN

## 2017-02-01 DIAGNOSIS — G4733 Obstructive sleep apnea (adult) (pediatric): Secondary | ICD-10-CM | POA: Diagnosis not present

## 2017-02-01 DIAGNOSIS — E119 Type 2 diabetes mellitus without complications: Secondary | ICD-10-CM | POA: Diagnosis not present

## 2017-02-01 DIAGNOSIS — M1990 Unspecified osteoarthritis, unspecified site: Secondary | ICD-10-CM | POA: Diagnosis not present

## 2017-02-01 DIAGNOSIS — J45909 Unspecified asthma, uncomplicated: Secondary | ICD-10-CM | POA: Diagnosis not present

## 2017-02-01 DIAGNOSIS — I110 Hypertensive heart disease with heart failure: Secondary | ICD-10-CM | POA: Diagnosis not present

## 2017-02-01 DIAGNOSIS — I5031 Acute diastolic (congestive) heart failure: Secondary | ICD-10-CM | POA: Diagnosis not present

## 2017-02-01 DIAGNOSIS — I4891 Unspecified atrial fibrillation: Secondary | ICD-10-CM | POA: Diagnosis not present

## 2017-02-01 DIAGNOSIS — J9621 Acute and chronic respiratory failure with hypoxia: Secondary | ICD-10-CM | POA: Diagnosis not present

## 2017-02-01 DIAGNOSIS — J441 Chronic obstructive pulmonary disease with (acute) exacerbation: Secondary | ICD-10-CM | POA: Diagnosis not present

## 2017-02-02 ENCOUNTER — Ambulatory Visit: Payer: Self-pay | Admitting: *Deleted

## 2017-02-02 DIAGNOSIS — Z7901 Long term (current) use of anticoagulants: Secondary | ICD-10-CM

## 2017-02-02 DIAGNOSIS — I4891 Unspecified atrial fibrillation: Secondary | ICD-10-CM

## 2017-02-02 LAB — POCT INR: INR: 2.6 — AB (ref <=1.1)

## 2017-02-02 NOTE — Patient Instructions (Signed)
Anticoagulation Warfarin Dose Instructions as of 02/02/2017      Dorene Grebe Tue Wed Thu Fri Sat   New Dose 5 mg 5 mg 2.5 mg 5 mg 5 mg 5 mg 5 mg    Description   Take 1/2 (2.5mg ) tablet on Tuesdays and 1 whole tablet (5mg ) all other days.  Your INR was 3.5 today This is higher than your goal of 2.0 - 3.0  Return on 02/21/17 at 8:00

## 2017-02-04 DIAGNOSIS — J441 Chronic obstructive pulmonary disease with (acute) exacerbation: Secondary | ICD-10-CM | POA: Diagnosis not present

## 2017-02-04 DIAGNOSIS — R531 Weakness: Secondary | ICD-10-CM | POA: Diagnosis not present

## 2017-02-04 DIAGNOSIS — M6281 Muscle weakness (generalized): Secondary | ICD-10-CM | POA: Diagnosis not present

## 2017-02-04 DIAGNOSIS — J449 Chronic obstructive pulmonary disease, unspecified: Secondary | ICD-10-CM | POA: Diagnosis not present

## 2017-02-04 DIAGNOSIS — I5040 Unspecified combined systolic (congestive) and diastolic (congestive) heart failure: Secondary | ICD-10-CM | POA: Diagnosis not present

## 2017-02-07 DIAGNOSIS — Z0100 Encounter for examination of eyes and vision without abnormal findings: Secondary | ICD-10-CM | POA: Diagnosis not present

## 2017-02-07 DIAGNOSIS — E109 Type 1 diabetes mellitus without complications: Secondary | ICD-10-CM | POA: Diagnosis not present

## 2017-02-07 DIAGNOSIS — H5200 Hypermetropia, unspecified eye: Secondary | ICD-10-CM | POA: Diagnosis not present

## 2017-02-07 LAB — HM DIABETES EYE EXAM

## 2017-02-07 NOTE — Progress Notes (Signed)
This was the last day for home health to check INR.  INR within normal range. Patient setup to come in on 02/21/17 for protime.

## 2017-02-08 DIAGNOSIS — J439 Emphysema, unspecified: Secondary | ICD-10-CM | POA: Diagnosis not present

## 2017-02-08 DIAGNOSIS — J9611 Chronic respiratory failure with hypoxia: Secondary | ICD-10-CM | POA: Diagnosis not present

## 2017-02-08 DIAGNOSIS — J449 Chronic obstructive pulmonary disease, unspecified: Secondary | ICD-10-CM | POA: Diagnosis not present

## 2017-02-21 ENCOUNTER — Ambulatory Visit (INDEPENDENT_AMBULATORY_CARE_PROVIDER_SITE_OTHER): Payer: Medicare HMO | Admitting: *Deleted

## 2017-02-21 DIAGNOSIS — I4891 Unspecified atrial fibrillation: Secondary | ICD-10-CM

## 2017-02-21 DIAGNOSIS — Z7901 Long term (current) use of anticoagulants: Secondary | ICD-10-CM | POA: Diagnosis not present

## 2017-02-21 LAB — COAGUCHEK XS/INR WAIVED
INR: 1.2 — ABNORMAL HIGH (ref 0.9–1.1)
Prothrombin Time: 14.9 s

## 2017-02-21 NOTE — Patient Instructions (Signed)
Anticoagulation Warfarin Dose Instructions as of 02/21/2017      Justin Ruiz Tue Wed Thu Fri Sat   New Dose 5 mg 5 mg 7.5 mg 5 mg 5 mg 5 mg 5 mg    Description   Take 1.5 tablets today and next Tuesday, and 1 tablet all other days.   Your INR was 1.2 today  This is lower than your goal of 2.0 - 3.0  Return on 02/21/17 at 8:00

## 2017-02-21 NOTE — Progress Notes (Signed)
Justin Hassell Done, FNP Subjective:     Indication: atrial fibrillation Bleeding signs/symptoms: None Thromboembolic signs/symptoms: None  Missed Coumadin doses: None Medication changes: no Dietary changes: no Bacterial/viral infection: no Other concerns: no  The following portions of the patient's history were reviewed and updated as appropriate: allergies and current medications.  Review of Systems Pertinent items are noted in HPI.   Objective:    INR Today: 1.2 Current dose: 2.5 mg on Tuesday and 5mg  dailly    Assessment:    Subtherapeutic INR for goal of 2-3   Plan:    1. New dose: 7.5 mg on Tuesdays and 5mg  all other days   2. Next INR: 1 week    Discussed with Chevis Pretty, FNP  [name], RN Justin Hassell Done, FNP

## 2017-02-23 DIAGNOSIS — I5040 Unspecified combined systolic (congestive) and diastolic (congestive) heart failure: Secondary | ICD-10-CM | POA: Diagnosis not present

## 2017-02-23 DIAGNOSIS — J441 Chronic obstructive pulmonary disease with (acute) exacerbation: Secondary | ICD-10-CM | POA: Diagnosis not present

## 2017-02-24 ENCOUNTER — Other Ambulatory Visit: Payer: Self-pay | Admitting: Family Medicine

## 2017-02-24 NOTE — Telephone Encounter (Signed)
[place] seen 11/22/16  DWM

## 2017-02-28 ENCOUNTER — Encounter: Payer: Medicare HMO | Admitting: *Deleted

## 2017-03-01 ENCOUNTER — Other Ambulatory Visit: Payer: Self-pay | Admitting: Family Medicine

## 2017-03-01 ENCOUNTER — Ambulatory Visit: Payer: Medicare HMO | Admitting: *Deleted

## 2017-03-01 ENCOUNTER — Encounter: Payer: Self-pay | Admitting: Family Medicine

## 2017-03-01 DIAGNOSIS — I4891 Unspecified atrial fibrillation: Secondary | ICD-10-CM | POA: Diagnosis not present

## 2017-03-01 DIAGNOSIS — Z7901 Long term (current) use of anticoagulants: Secondary | ICD-10-CM | POA: Diagnosis not present

## 2017-03-02 LAB — SPECIMEN STATUS REPORT

## 2017-03-02 LAB — PROTIME-INR
INR: 1.8 — ABNORMAL HIGH (ref 0.8–1.2)
PROTHROMBIN TIME: 17.9 s — AB (ref 9.1–12.0)

## 2017-03-02 NOTE — Progress Notes (Signed)
Subjective:     Indication: atrial fibrillation Bleeding signs/symptoms: None Thromboembolic signs/symptoms: None  Missed Coumadin doses: None Medication changes: no Dietary changes: no Bacterial/viral infection: no Other concerns: no  The following portions of the patient's history were reviewed and updated as appropriate: allergies and current medications.  Review of Systems Pertinent items are noted in HPI.   Objective:    INR Today: 1.8  Current dose: 7.5 mg on Tues and 5mg  all other days    Assessment:    Subtherapeutic INR for goal of 2-3   Plan:    1. New dose: 7.5 mg on Tues and Thurs and 5mg  all other days   2. Next INR: 2 weeks    Discussed with Chevis Pretty, FNP Mary-Margaret Hassell Done, FNP  [name], RN

## 2017-03-07 DIAGNOSIS — J441 Chronic obstructive pulmonary disease with (acute) exacerbation: Secondary | ICD-10-CM | POA: Diagnosis not present

## 2017-03-07 DIAGNOSIS — I5040 Unspecified combined systolic (congestive) and diastolic (congestive) heart failure: Secondary | ICD-10-CM | POA: Diagnosis not present

## 2017-03-07 DIAGNOSIS — M6281 Muscle weakness (generalized): Secondary | ICD-10-CM | POA: Diagnosis not present

## 2017-03-07 DIAGNOSIS — J449 Chronic obstructive pulmonary disease, unspecified: Secondary | ICD-10-CM | POA: Diagnosis not present

## 2017-03-07 DIAGNOSIS — R531 Weakness: Secondary | ICD-10-CM | POA: Diagnosis not present

## 2017-03-11 DIAGNOSIS — J449 Chronic obstructive pulmonary disease, unspecified: Secondary | ICD-10-CM | POA: Diagnosis not present

## 2017-03-11 DIAGNOSIS — J9611 Chronic respiratory failure with hypoxia: Secondary | ICD-10-CM | POA: Diagnosis not present

## 2017-03-11 DIAGNOSIS — J439 Emphysema, unspecified: Secondary | ICD-10-CM | POA: Diagnosis not present

## 2017-03-15 ENCOUNTER — Ambulatory Visit (INDEPENDENT_AMBULATORY_CARE_PROVIDER_SITE_OTHER): Payer: Medicare HMO | Admitting: *Deleted

## 2017-03-15 DIAGNOSIS — Z7901 Long term (current) use of anticoagulants: Secondary | ICD-10-CM

## 2017-03-15 DIAGNOSIS — I4891 Unspecified atrial fibrillation: Secondary | ICD-10-CM | POA: Diagnosis not present

## 2017-03-15 DIAGNOSIS — R791 Abnormal coagulation profile: Secondary | ICD-10-CM

## 2017-03-15 DIAGNOSIS — G4733 Obstructive sleep apnea (adult) (pediatric): Secondary | ICD-10-CM | POA: Diagnosis not present

## 2017-03-15 LAB — COAGUCHEK XS/INR WAIVED
INR: 7 — AB (ref 0.9–1.1)
PROTHROMBIN TIME: 83.9 s

## 2017-03-15 LAB — HEMOGLOBIN, FINGERSTICK: Hemoglobin: 12.5 g/dL — ABNORMAL LOW (ref 12.6–17.7)

## 2017-03-15 NOTE — Progress Notes (Signed)
Subjective:     Indication: atrial fibrillation Bleeding signs/symptoms: very light nosebleed due to dry air. Was not hard to stop.  Thromboembolic signs/symptoms: None  Missed Coumadin doses: None Medication changes: no Dietary changes: no Bacterial/viral infection: no Other concerns: no  The following portions of the patient's history were reviewed and updated as appropriate: allergies and current medications.  Review of Systems Pertinent items are noted in HPI.   Objective:    INR Today: 7.0 and 6.8 Current dose: 7.5mg  on Tues & Thurs and 5mg  all other days Hemoglobin: 12.5  Assessment:    Supratherapeutic INR for goal of 2-3   Plan:    1. New dose: Hold coumadin today and tomorrow.    2. Next INR: in 2 days on Friday. Appt scheduled with Shelah Lewandowsky since I will be out of the office.   INR was drawn and sent out as well.      Mary-Margaret Hassell Done, FNP

## 2017-03-16 LAB — PROTIME-INR
INR: 7.3 — AB (ref 0.8–1.2)
Prothrombin Time: 68.2 s — ABNORMAL HIGH (ref 9.1–12.0)

## 2017-03-17 ENCOUNTER — Encounter: Payer: Medicare HMO | Admitting: Nurse Practitioner

## 2017-03-17 ENCOUNTER — Encounter: Payer: Self-pay | Admitting: Nurse Practitioner

## 2017-03-17 ENCOUNTER — Ambulatory Visit (INDEPENDENT_AMBULATORY_CARE_PROVIDER_SITE_OTHER): Payer: Medicare HMO | Admitting: Nurse Practitioner

## 2017-03-17 VITALS — BP 118/76 | HR 85 | Temp 97.0°F | Ht 71.0 in | Wt 289.0 lb

## 2017-03-17 DIAGNOSIS — I4891 Unspecified atrial fibrillation: Secondary | ICD-10-CM

## 2017-03-17 DIAGNOSIS — Z7901 Long term (current) use of anticoagulants: Secondary | ICD-10-CM

## 2017-03-17 LAB — COAGUCHEK XS/INR WAIVED
INR: 1.6 — AB (ref 0.9–1.1)
PROTHROMBIN TIME: 19.7 s

## 2017-03-17 NOTE — Progress Notes (Signed)
   Subjective:    Patient ID: Justin Ruiz, male    DOB: [birth year], 66 y.o.   MRN: [id number]  HPI duplicate   Review of Systems     Objective:   Physical Exam        Assessment & Plan:

## 2017-03-17 NOTE — Progress Notes (Signed)
Subjective:   Patient comes in today for INR recheck. He was seen 2 days ago with INR of 7.2. Was told to hold meds and recheck today   Indication: PE Bleeding signs/symptoms: None Thromboembolic signs/symptoms: None  Missed Coumadin doses: has not taken last 2 days as instructed Medication changes: has held medication dose for the last 2 days as directed. Dietary changes: no Bacterial/viral infection: no Other concerns: no  The following portions of the patient's history were reviewed and updated as appropriate: allergies, current medications, past family history, past medical history, past social history, past surgical history and problem list.  Review of Systems Pertinent items noted in HPI and remainder of comprehensive ROS otherwise negative.   Objective:    INR Today: 1.6 Current dose: holding currently    Assessment:    Subtherapeutic INR for goal of 2-3   Plan:    1. New dose: {coumadin 5mg - 1.5 tablets today  Then 1 tablet daily except 1.5tablets tuesday  2. Next INR: next Wednesday  Mary-Margaret Hassell Done, FNP

## 2017-03-25 DIAGNOSIS — J441 Chronic obstructive pulmonary disease with (acute) exacerbation: Secondary | ICD-10-CM | POA: Diagnosis not present

## 2017-03-25 DIAGNOSIS — I5040 Unspecified combined systolic (congestive) and diastolic (congestive) heart failure: Secondary | ICD-10-CM | POA: Diagnosis not present

## 2017-03-27 ENCOUNTER — Encounter: Payer: Self-pay | Admitting: Family Medicine

## 2017-03-27 ENCOUNTER — Ambulatory Visit: Payer: Medicare HMO | Admitting: Family Medicine

## 2017-03-27 VITALS — BP 111/73 | HR 57 | Temp 97.1°F | Ht 71.0 in | Wt 281.0 lb

## 2017-03-27 DIAGNOSIS — C8332 Diffuse large B-cell lymphoma, intrathoracic lymph nodes: Secondary | ICD-10-CM | POA: Diagnosis not present

## 2017-03-27 DIAGNOSIS — I4891 Unspecified atrial fibrillation: Secondary | ICD-10-CM | POA: Diagnosis not present

## 2017-03-27 DIAGNOSIS — E559 Vitamin D deficiency, unspecified: Secondary | ICD-10-CM | POA: Diagnosis not present

## 2017-03-27 DIAGNOSIS — D696 Thrombocytopenia, unspecified: Secondary | ICD-10-CM | POA: Diagnosis not present

## 2017-03-27 DIAGNOSIS — N400 Enlarged prostate without lower urinary tract symptoms: Secondary | ICD-10-CM

## 2017-03-27 DIAGNOSIS — E7800 Pure hypercholesterolemia, unspecified: Secondary | ICD-10-CM | POA: Diagnosis not present

## 2017-03-27 DIAGNOSIS — J449 Chronic obstructive pulmonary disease, unspecified: Secondary | ICD-10-CM | POA: Diagnosis not present

## 2017-03-27 DIAGNOSIS — Z7901 Long term (current) use of anticoagulants: Secondary | ICD-10-CM

## 2017-03-27 DIAGNOSIS — E119 Type 2 diabetes mellitus without complications: Secondary | ICD-10-CM | POA: Diagnosis not present

## 2017-03-27 DIAGNOSIS — Z794 Long term (current) use of insulin: Secondary | ICD-10-CM | POA: Diagnosis not present

## 2017-03-27 LAB — BAYER DCA HB A1C WAIVED: HB A1C (BAYER DCA - WAIVED): 7.3 % — ABNORMAL HIGH (ref <7.0)

## 2017-03-27 LAB — COAGUCHEK XS/INR WAIVED
INR: 2.4 — AB (ref 0.9–1.1)
Prothrombin Time: 29.3 s

## 2017-03-27 MED ORDER — PAROXETINE HCL 20 MG PO TABS: 20.0000 mg | ORAL_TABLET | Freq: Every day | ORAL | 3 refills | Status: DC

## 2017-03-27 MED ORDER — IPRATROPIUM-ALBUTEROL 0.5-2.5 (3) MG/3ML IN SOLN
RESPIRATORY_TRACT | 0 refills | Status: DC
Start: 2017-03-27 — End: 2018-03-30

## 2017-03-27 MED ORDER — ALBUTEROL SULFATE HFA 108 (90 BASE) MCG/ACT IN AERS: INHALATION_SPRAY | RESPIRATORY_TRACT | 12 refills | Status: DC

## 2017-03-27 MED ORDER — METOPROLOL TARTRATE 100 MG PO TABS: 100.0000 mg | ORAL_TABLET | Freq: Two times a day (BID) | ORAL | 3 refills | Status: DC

## 2017-03-27 MED ORDER — WARFARIN SODIUM 5 MG PO TABS: 5.0000 mg | ORAL_TABLET | Freq: Every day | ORAL | 3 refills | Status: DC

## 2017-03-27 MED ORDER — FUROSEMIDE 40 MG PO TABS: 40.0000 mg | ORAL_TABLET | Freq: Two times a day (BID) | ORAL | 6 refills | Status: DC

## 2017-03-27 MED ORDER — INSULIN NPH (HUMAN) (ISOPHANE) 100 UNIT/ML ~~LOC~~ SUSP: 50.0000 [IU] | Freq: Every day | SUBCUTANEOUS | 1 refills | Status: DC

## 2017-03-27 MED ORDER — METFORMIN HCL 500 MG PO TABS: ORAL_TABLET | ORAL | 3 refills | Status: DC

## 2017-03-27 MED ORDER — INSULIN REGULAR HUMAN 100 UNIT/ML IJ SOLN: INTRAMUSCULAR | 1 refills | Status: DC

## 2017-03-27 MED ORDER — SIMVASTATIN 40 MG PO TABS: 40.0000 mg | ORAL_TABLET | Freq: Every day | ORAL | 3 refills | Status: DC

## 2017-03-27 NOTE — Progress Notes (Signed)
Subjective:    Patient ID: Justin Ruiz, male    DOB: 1950/06/12, 66 y.o.   MRN: [id number]  HPI Pt here for follow up and management of chronic medical problems which includes diabetes, hyperlipidemia and a fib. He is taking medication regaurlly.  The patient comes to the visit today with his wife.  Some other complaints or today that he sleeps a lot during the day.  He still has some confusion.  He is following up with oncology this month and wants to discuss his oxygen management further.  He is requesting refills on all of his medicines.  There is some concern also that he would like to change his blood thinner.  He is due to return in FOBT and get lab work today.  His oxygen level on 3 L of oxygen was 79-83%.  He should continue his pro time as doing as his INR was 2.4.  The patient comes to the visit today with his wife.  He is got his days and nights mixed up according to both of them.  He apparently is not taking his Coumadin or taking too much and this is what is happening with the varying protimes.  We discussed ways of identifying 8:00 in the morning and 8:00 in the evening so that he would know when with morning medicines are to be taken and evening medicines are to be taken.  Also we suggest that he get a desktop calendar mark the days off of the week so he will no when [place] arrives and know what day it is of the week instead of getting confused.  He also indicates he has not been using his CPAP machine regularly.  He follows up with oncology this month.  The oxygen should be At no more than 3 L/min.  The INR was good today and he will continue with his current Coumadin treatment and recheck another pro time in 1 month.  We also discussed home monitoring for his Coumadin and they may be interested in this.  He is followed by the cardiologist yearly, Dr. Earlie Server yearly and his pulmonologist Dr. Annamaria Boots every 6 months.    Patient Active Problem List   Diagnosis Date Noted  . Acute on  chronic respiratory failure with hypoxia ([place]) 12/04/2016  . Thrombocytopenia ([place]) 11/23/2016  . Memory loss 04/13/2016  . Hypomagnesemia   . Morbid obesity ([place]) 03/29/2016  . Hypoglycemia 03/28/2016  . Acute encephalopathy 03/28/2016  . Acute diastolic heart failure ([place]) 03/28/2016  . COPD mixed type ([place]) 03/28/2016  . Atrial fibrillation with RVR ([place]) 03/28/2016  . 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 Chronic diastolic heart failure ([place])   . CAP (community acquired pneumonia)   . Sepsis ([place]) 06/07/2015  . Diabetic neuropathy, type II diabetes mellitus ([place]) 09/25/2014  . Obstructive sleep apnea 05/25/2014  . Benign neoplasm of ascending colon 05/05/2014  . Excessive daytime sleepiness 01/24/2014  . Mediastinal adenopathy 06/20/2013  . Severe obesity (BMI >= 40) ([place]) 05/01/2013  . Generalized anxiety disorder 05/01/2013  . Lung nodules 03/11/2013  . Chronic anticoagulation 08/20/2012  . NHL (non-Hodgkin's lymphoma) ([place]) 01/26/2010  . Obesity, morbid ([place]) 04/27/2009  . CARDIOVASCULAR STUDIES, ABNORMAL 04/27/2009  . ABNORMAL STRESS ELECTROCARDIOGRAM 03/25/2009  . PERSONAL HISTORY OF COLONIC POLYPS 03/02/2009  . Diabetes mellitus type 2, insulin dependent ([place]) 04/29/2007  . Hyperlipidemia 04/29/2007  . Hypertension 04/29/2007  . Seasonal and  perennial allergic rhinitis 04/29/2007  . COPD exacerbation ([place]) 04/29/2007  . G E R D 04/29/2007  . BENIGN PROSTATIC HYPERTROPHY, HX OF 04/29/2007   Outpatient Encounter Medications as of 03/27/2017  Medication Sig  . acetaminophen (TYLENOL) 500 MG tablet Take 1,000 mg by mouth every 6 (six) hours as needed for headache. Reported on 10/08/2015  . albuterol (PROAIR HFA) 108 (90 Base) MCG/ACT inhaler Inhale 2 puffs every 4-6 hours -rescue inhaler  . ALPRAZolam (XANAX) 0.25 MG tablet TAKE 1 TABLET BY MOUTH THREE TIMES DAILY  AS NEEDED FOR  ANXIETY  . Blood Glucose Monitoring Suppl (CLEVER CHEK AUTO-CODE VOICE) DEVI USE AS DIRECTED  . Cholecalciferol (VITAMIN D3) 5000 UNITS TABS Take 1 tablet by mouth every morning.  . furosemide (LASIX) 40 MG tablet Take 1 tablet (40 mg total) by mouth 2 (two) times daily. As directed  . guaiFENesin (MUCINEX) 600 MG 12 hr tablet Take 600 mg by mouth 2 (two) times daily as needed for cough or to loosen phlegm.  . insulin NPH Human (NOVOLIN N) 100 UNIT/ML injection Inject 0.5 mLs (50 Units total) into the skin daily before breakfast.  . insulin regular (NOVOLIN R,HUMULIN R) 250 units/2.85m (100 units/mL) injection 6 units prior to each meal  . ipratropium-albuterol (DUONEB) 0.5-2.5 (3) MG/3ML SOLN PLACE 1 VIAL (3 MLS) IN THE NEBULIZER EVERY 6 HOURS AS NEEDED  . Lancet Devices (LANCING DEVICE) MISC USE AS DIRECTED  . metFORMIN (GLUCOPHAGE) 500 MG tablet TAKE 1 TABLET BY MOUTH IN THE MORNING AND 2 IN THE EVENING  . metoprolol (LOPRESSOR) 100 MG tablet Take 1 tablet (100 mg total) by mouth 2 (two) times daily.  .Marland KitchenPARoxetine (PAXIL) 20 MG tablet Take 1 tablet (20 mg total) by mouth daily.  . simvastatin (ZOCOR) 40 MG tablet Take 1 tablet (40 mg total) by mouth daily.  .Marland Kitchenwarfarin (COUMADIN) 5 MG tablet Take 1 tablet (5 mg total) by mouth daily. Or as directed by anticoagulation clinic (Patient taking differently: Take 5 mg by mouth daily. )  . [DISCONTINUED] predniSONE (DELTASONE) 20 MG tablet Take daily by mouth: 40 mg x3 days, then 20 mg x3 days, then 10 mg x3 days, then stop.   No facility-administered encounter medications on file as of 03/27/2017.       Review of Systems  Constitutional: Negative.   HENT: Negative.   Eyes: Negative.   Respiratory: Positive for shortness of breath (low Oxygen levels).   Cardiovascular: Negative.   Gastrointestinal: Negative.   Endocrine: Negative.   Genitourinary: Negative.   Musculoskeletal: Negative.   Skin: Negative.   Allergic/Immunologic:  Negative.   Neurological: Negative.   Hematological: Negative.   Psychiatric/Behavioral: Positive for confusion.       Sleeps during the day        Objective:   Physical Exam  Constitutional: He is oriented to person, place, and time. He appears well-developed and well-nourished. No distress.  Patient is pleasant and more alert and calm.  HENT:  Head: Normocephalic and atraumatic.  Right Ear: External ear normal.  Left Ear: External ear normal.  Nose: Nose normal.  Mouth/Throat: Oropharynx is clear and moist. No oropharyngeal exudate.  Eyes: Conjunctivae and EOM are normal. Pupils are equal, round, and reactive to light. Right eye exhibits no discharge. Left eye exhibits no discharge. No scleral icterus.  Neck: Normal range of motion. Neck supple. No thyromegaly present.  No bruits thyromegaly or anterior cervical adenopathy  Cardiovascular: Normal rate, regular rhythm, normal heart sounds and intact  distal pulses.  No murmur heard. Heart has a regular rate and rhythm at 72/min  Pulmonary/Chest: [place] normal. No respiratory distress. He has no wheezes. He has no rales.  Diminished breath sounds bilaterally  Abdominal: Soft. Bowel sounds are normal. He exhibits no mass. There is no tenderness. There is no rebound and no guarding.  Abdominal obesity without masses tenderness or organ enlargement or bruits  Musculoskeletal: Normal range of motion. He exhibits no edema.  Lymphadenopathy:    He has no cervical adenopathy.  Neurological: He is alert and oriented to person, place, and time. He has normal reflexes. No cranial nerve deficit.  Skin: Skin is warm and dry. No rash noted.  Psychiatric: He has a normal mood and affect. His behavior is normal. Judgment and thought content normal.  Nursing note and vitals reviewed.  BP 111/73 (BP Location: Left Arm)   Pulse (!) 57   Temp (!) 97.1 F (36.2 C) (Oral)   Ht '5\' 11"'  (1.803 m)   Wt 281 lb (127.5 kg)   SpO2 (!) 79% Comment: on 3  liters  BMI 39.19 kg/m        Assessment & Plan:  1. Atrial fibrillation with RVR ([place]) -Heart today had a regular rate and rhythm at 72/min -Follow-up with cardiology as planned - CBC with Differential/Platelet - CoaguChek XS/INR Waived  2. Type 2 diabetes mellitus treated with insulin ([place]) -Continue current treatment pending results of lab work - BMP8+EGFR - CBC with Differential/Platelet - Bayer DCA Hb A1c Waived - Microalbumin / creatinine urine ratio  3. Vitamin D deficiency -Continue current treatment pending results of lab work - CBC with Differential/Platelet - VITAMIN D 25 Hydroxy (Vit-D Deficiency, Fractures)  4. Pure hypercholesterolemia -Continue as aggressive therapeutic lifestyle changes and current treatment - CBC with Differential/Platelet - Lipid panel - Hepatic function panel  5. Chronic obstructive pulmonary disease, unspecified COPD type ([place]) -Continue to follow-up with pulmonology and use CPAP machine regularly nightly -Continue with nebulizer therapy - CBC with Differential/Platelet  6. Benign prostatic hyperplasia, unspecified whether lower urinary tract symptoms present - CBC with Differential/Platelet  No orders of the defined types were placed in this encounter.  Patient Instructions                       Medicare Annual Wellness Visit  [facility] and the medical providers at [facility] strive to bring you the best medical care.  In doing so we not only want to address your current medical conditions and concerns but also to detect new conditions early and prevent illness, disease and health-related problems.    Medicare offers a yearly Wellness Visit which allows our clinical staff to assess your need for preventative services including immunizations, lifestyle education, counseling to decrease risk of preventable diseases and screening for fall risk and other medical concerns.    This visit is provided free of  charge (no copay) for all Medicare recipients. The clinical pharmacists at [facility] have begun to conduct these Wellness Visits which will also include a thorough review of all your medications.    As you primary medical provider recommend that you make an appointment for your Annual Wellness Visit if you have not done so already this year.  You may set up this appointment before you leave today or you may call back ([phone]) and schedule an appointment.  Please make sure when you call that you mention that you are scheduling your  Annual Wellness Visit with the clinical pharmacist so that the appointment may be made for the proper length of time.     Continue current medications. Continue good therapeutic lifestyle changes which include good diet and exercise. Fall precautions discussed with patient. If an FOBT was given today- please return it to our front desk. If you are over 45 years old - you may need Prevnar 21 or the adult Pneumonia vaccine.  **Flu shots are available--- please call and schedule a FLU-CLINIC appointment**  After your visit with [place] today you will receive a survey in the mail or online from [employer] regarding your care with [place]. Please take a moment to fill this out. Your feedback is very important to [place] as you can help [place] better understand your patient needs as well as improve your experience and satisfaction. WE CARE ABOUT YOU!!!   Try to get a handle on the daytime sleeping and the nighttime wakening Reidentify pill containers for the morning and the evening by putting 8 AM or 8 PM and using different colors for the dispensers.  Also get a large calendar and have the patient marked each day also he knows which day of the week he is in. He should continue to drink plenty of fluids and stay well-hydrated. Stay active physically   Arrie Senate MD

## 2017-03-27 NOTE — Addendum Note (Signed)
Addended by: Zannie Cove on: 03/27/2017 12:34 PM   Modules accepted: Orders

## 2017-03-27 NOTE — Patient Instructions (Addendum)
Medicare Annual Wellness Visit  [facility] and the medical providers at [facility] strive to bring you the best medical care.  In doing so we not only want to address your current medical conditions and concerns but also to detect new conditions early and prevent illness, disease and health-related problems.    Medicare offers a yearly Wellness Visit which allows our clinical staff to assess your need for preventative services including immunizations, lifestyle education, counseling to decrease risk of preventable diseases and screening for fall risk and other medical concerns.    This visit is provided free of charge (no copay) for all Medicare recipients. The clinical pharmacists at [facility] have begun to conduct these Wellness Visits which will also include a thorough review of all your medications.    As you primary medical provider recommend that you make an appointment for your Annual Wellness Visit if you have not done so already this year.  You may set up this appointment before you leave today or you may call back ([phone]) and schedule an appointment.  Please make sure when you call that you mention that you are scheduling your Annual Wellness Visit with the clinical pharmacist so that the appointment may be made for the proper length of time.     Continue current medications. Continue good therapeutic lifestyle changes which include good diet and exercise. Fall precautions discussed with patient. If an FOBT was given today- please return it to our front desk. If you are over 23 years old - you may need Prevnar 45 or the adult Pneumonia vaccine.  **Flu shots are available--- please call and schedule a FLU-CLINIC appointment**  After your visit with [place] today you will receive a survey in the mail or online from [employer] regarding your care with [place]. Please take a moment to fill this out. Your feedback is very  important to [place] as you can help [place] better understand your patient needs as well as improve your experience and satisfaction. WE CARE ABOUT YOU!!!   Try to get a handle on the daytime sleeping and the nighttime wakening Reidentify pill containers for the morning and the evening by putting 8 AM or 8 PM and using different colors for the dispensers.  Also get a large calendar and have the patient marked each day also he knows which day of the week he is in. He should continue to drink plenty of fluids and stay well-hydrated. Stay active physically   Description   Continue your current coumadin dose.

## 2017-03-28 ENCOUNTER — Telehealth: Payer: Self-pay | Admitting: *Deleted

## 2017-03-28 ENCOUNTER — Other Ambulatory Visit: Payer: Self-pay | Admitting: *Deleted

## 2017-03-28 LAB — LIPID PANEL
CHOLESTEROL TOTAL: 200 mg/dL — AB (ref 100–199)
Chol/HDL Ratio: 4.5 ratio (ref 0.0–5.0)
HDL: 44 mg/dL (ref >39)
LDL Calculated: 90 mg/dL (ref 0–99)
Triglycerides: 330 mg/dL — ABNORMAL HIGH (ref 0–149)
VLDL CHOLESTEROL CAL: 66 mg/dL — AB (ref 5–40)

## 2017-03-28 LAB — CBC WITH DIFFERENTIAL/PLATELET
BASOS ABS: 0 10*3/uL (ref 0.0–0.2)
Basos: 0 %
EOS (ABSOLUTE): 0.1 10*3/uL (ref 0.0–0.4)
Eos: 2 %
Hematocrit: 42.5 % (ref 37.5–51.0)
Hemoglobin: 13.8 g/dL (ref 13.0–17.7)
IMMATURE GRANS (ABS): 0 10*3/uL (ref 0.0–0.1)
IMMATURE GRANULOCYTES: 0 %
LYMPHS: 21 %
Lymphocytes Absolute: 1.5 10*3/uL (ref 0.7–3.1)
MCH: 31 pg (ref 26.6–33.0)
MCHC: 32.5 g/dL (ref 31.5–35.7)
MCV: 96 fL (ref 79–97)
MONOS ABS: 0.6 10*3/uL (ref 0.1–0.9)
Monocytes: 8 %
NEUTROS PCT: 69 %
Neutrophils Absolute: 5 10*3/uL (ref 1.4–7.0)
PLATELETS: 199 10*3/uL (ref 150–379)
RBC: 4.45 x10E6/uL (ref 4.14–5.80)
RDW: 14.2 % (ref 12.3–15.4)
WBC: 7.2 10*3/uL (ref 3.4–10.8)

## 2017-03-28 LAB — BMP8+EGFR
BUN/Creatinine Ratio: 20 (ref 10–24)
BUN: 29 mg/dL — ABNORMAL HIGH (ref 8–27)
CALCIUM: 9.6 mg/dL (ref 8.6–10.2)
CHLORIDE: 91 mmol/L — AB (ref 96–106)
CO2: 35 mmol/L — AB (ref 20–29)
Creatinine, Ser: 1.42 mg/dL — ABNORMAL HIGH (ref 0.76–1.27)
GFR calc Af Amer: 59 mL/min/{1.73_m2} — ABNORMAL LOW (ref >59)
GFR calc non Af Amer: 51 mL/min/{1.73_m2} — ABNORMAL LOW (ref >59)
GLUCOSE: 200 mg/dL — AB (ref 65–99)
POTASSIUM: 4.4 mmol/L (ref 3.5–5.2)
SODIUM: 139 mmol/L (ref 134–144)

## 2017-03-28 LAB — HEPATIC FUNCTION PANEL
ALK PHOS: 86 IU/L (ref 39–117)
ALT: 13 IU/L (ref 0–44)
AST: 15 IU/L (ref 0–40)
Albumin: 4 g/dL (ref 3.6–4.8)
BILIRUBIN TOTAL: 0.4 mg/dL (ref 0.0–1.2)
BILIRUBIN, DIRECT: 0.13 mg/dL (ref 0.00–0.40)
TOTAL PROTEIN: 7 g/dL (ref 6.0–8.5)

## 2017-03-28 LAB — MICROALBUMIN / CREATININE URINE RATIO
CREATININE, UR: 112.3 mg/dL
Microalb/Creat Ratio: 35.6 mg/g creat — ABNORMAL HIGH (ref 0.0–30.0)
Microalbumin, Urine: 40 ug/mL

## 2017-03-28 LAB — VITAMIN D 25 HYDROXY (VIT D DEFICIENCY, FRACTURES): VIT D 25 HYDROXY: 38.1 ng/mL (ref 30.0–100.0)

## 2017-03-28 MED ORDER — FENOFIBRATE 160 MG PO TABS: 160.0000 mg | ORAL_TABLET | Freq: Every day | ORAL | 1 refills | Status: DC

## 2017-03-28 NOTE — Telephone Encounter (Signed)
He will just have to continue with aggressive therapeutic lifestyle changes because fish oil can play a role with increased bleeding also.  Discontinue the order for fenofibrate.

## 2017-03-28 NOTE — Telephone Encounter (Signed)
Wife aware

## 2017-04-06 DIAGNOSIS — I5040 Unspecified combined systolic (congestive) and diastolic (congestive) heart failure: Secondary | ICD-10-CM | POA: Diagnosis not present

## 2017-04-06 DIAGNOSIS — R531 Weakness: Secondary | ICD-10-CM | POA: Diagnosis not present

## 2017-04-06 DIAGNOSIS — J441 Chronic obstructive pulmonary disease with (acute) exacerbation: Secondary | ICD-10-CM | POA: Diagnosis not present

## 2017-04-06 DIAGNOSIS — M6281 Muscle weakness (generalized): Secondary | ICD-10-CM | POA: Diagnosis not present

## 2017-04-06 DIAGNOSIS — J449 Chronic obstructive pulmonary disease, unspecified: Secondary | ICD-10-CM | POA: Diagnosis not present

## 2017-04-10 DIAGNOSIS — J449 Chronic obstructive pulmonary disease, unspecified: Secondary | ICD-10-CM | POA: Diagnosis not present

## 2017-04-10 DIAGNOSIS — J9611 Chronic respiratory failure with hypoxia: Secondary | ICD-10-CM | POA: Diagnosis not present

## 2017-04-10 DIAGNOSIS — J439 Emphysema, unspecified: Secondary | ICD-10-CM | POA: Diagnosis not present

## 2017-04-16 ENCOUNTER — Emergency Department (HOSPITAL_COMMUNITY)
Admission: EM | Admit: 2017-04-16 | Discharge: 2017-04-16 | Disposition: A | Discharge status: Home / Self Care | Payer: Medicare HMO | Attending: Emergency Medicine | Admitting: Emergency Medicine

## 2017-04-16 ENCOUNTER — Emergency Department (HOSPITAL_COMMUNITY): Payer: Medicare HMO

## 2017-04-16 ENCOUNTER — Other Ambulatory Visit: Payer: Self-pay

## 2017-04-16 ENCOUNTER — Encounter (HOSPITAL_COMMUNITY): Payer: Self-pay | Admitting: Emergency Medicine

## 2017-04-16 DIAGNOSIS — Z87891 Personal history of nicotine dependence: Secondary | ICD-10-CM | POA: Insufficient documentation

## 2017-04-16 DIAGNOSIS — I110 Hypertensive heart disease with heart failure: Secondary | ICD-10-CM | POA: Insufficient documentation

## 2017-04-16 DIAGNOSIS — J441 Chronic obstructive pulmonary disease with (acute) exacerbation: Secondary | ICD-10-CM | POA: Diagnosis not present

## 2017-04-16 DIAGNOSIS — Z79899 Other long term (current) drug therapy: Secondary | ICD-10-CM | POA: Insufficient documentation

## 2017-04-16 DIAGNOSIS — Z794 Long term (current) use of insulin: Secondary | ICD-10-CM | POA: Insufficient documentation

## 2017-04-16 DIAGNOSIS — E119 Type 2 diabetes mellitus without complications: Secondary | ICD-10-CM | POA: Insufficient documentation

## 2017-04-16 DIAGNOSIS — I5032 Chronic diastolic (congestive) heart failure: Secondary | ICD-10-CM | POA: Diagnosis not present

## 2017-04-16 DIAGNOSIS — R0602 Shortness of breath: Secondary | ICD-10-CM | POA: Diagnosis not present

## 2017-04-16 LAB — CBC WITH DIFFERENTIAL/PLATELET
BASOS PCT: 0 %
Basophils Absolute: 0 10*3/uL (ref 0.0–0.1)
Eosinophils Absolute: 0.1 10*3/uL (ref 0.0–0.7)
Eosinophils Relative: 1 %
HCT: 44.8 % (ref 39.0–52.0)
Hemoglobin: 13.6 g/dL (ref 13.0–17.0)
LYMPHS ABS: 1.2 10*3/uL (ref 0.7–4.0)
Lymphocytes Relative: 14 %
MCH: 29.6 pg (ref 26.0–34.0)
MCHC: 30.4 g/dL (ref 30.0–36.0)
MCV: 97.6 fL (ref 78.0–100.0)
MONO ABS: 0.6 10*3/uL (ref 0.1–1.0)
MONOS PCT: 6 %
NEUTROS ABS: 7.1 10*3/uL (ref 1.7–7.7)
Neutrophils Relative %: 79 %
Platelets: 167 10*3/uL (ref 150–400)
RBC: 4.59 MIL/uL (ref 4.22–5.81)
RDW: 13.2 % (ref 11.5–15.5)
WBC: 8.9 10*3/uL (ref 4.0–10.5)

## 2017-04-16 LAB — BASIC METABOLIC PANEL
ANION GAP: 10 (ref 5–15)
BUN: 23 mg/dL — ABNORMAL HIGH (ref 6–20)
CALCIUM: 9.4 mg/dL (ref 8.9–10.3)
CHLORIDE: 94 mmol/L — AB (ref 101–111)
CO2: 37 mmol/L — AB (ref 22–32)
Creatinine, Ser: 0.94 mg/dL (ref 0.61–1.24)
GFR calc Af Amer: >60 mL/min (ref >60)
GFR calc non Af Amer: >60 mL/min (ref >60)
GLUCOSE: 326 mg/dL — AB (ref 65–99)
Potassium: 5 mmol/L (ref 3.5–5.1)
Sodium: 141 mmol/L (ref 135–145)

## 2017-04-16 LAB — TROPONIN I: Troponin I: <0.03 ng/mL (ref <0.03)

## 2017-04-16 MED ORDER — PREDNISONE 20 MG PO TABS: 20.0000 mg | ORAL_TABLET | Freq: Two times a day (BID) | ORAL | 0 refills | Status: DC

## 2017-04-16 MED ORDER — ALBUTEROL (5 MG/ML) CONTINUOUS INHALATION SOLN
20.0000 mg/h | INHALATION_SOLUTION | Freq: Once | RESPIRATORY_TRACT | Status: AC
  Administered 2017-04-16: 20 mg/h via RESPIRATORY_TRACT
  Filled 2017-04-16: qty 20

## 2017-04-16 MED ORDER — METHYLPREDNISOLONE SODIUM SUCC 125 MG IJ SOLR
125.0000 mg | Freq: Once | INTRAMUSCULAR | Status: AC
  Administered 2017-04-16: 125 mg via INTRAVENOUS
  Filled 2017-04-16: qty 2

## 2017-04-16 NOTE — ED Provider Notes (Signed)
[facility] EMERGENCY DEPARTMENT Provider Note   CSN: [id number] Arrival date & time: 04/16/17  1821     History   Chief Complaint Chief Complaint  Patient presents with  . Shortness of Breath    HPI Justin Ruiz is a 65 y.o. male.  He presents for evaluation of shortness of breath.  Difficulty started today, and was not responsive to his usual home treatments including oxygen.  No recent doctor visits or medical problems known to the patient.  He was unable to give much history initially, because of severe shortness of breath on arrival by private vehicle.  After the patient improved following nebulizer treatment he states that he has not used his nebulizer recently, today only used his inhaler.  He uses as needed oxygen at home.  He has not had any recent illnesses and denies fever, productive cough, nausea, vomiting, focal weakness or paresthesia.  There are no other known modifying factors.  HPI  Past Medical History:  Diagnosis Date  . Arthritis   . Asthma   . Atrial fibrillation ([place])    on AC  . BPH (benign prostatic hypertrophy)   . Colon polyps   . COPD (chronic obstructive pulmonary disease) ([place])    on 2.5-3L home O2  . Diabetes mellitus   . GERD (gastroesophageal reflux disease)   . HTN (hypertension)    x 3 years  . Hyperlipidemia   . Lung nodules 03/11/2013  . Mediastinal adenopathy 06/20/2013   CT  & PET 2/15   . Memory loss   . 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 NHL (non-Hodgkin's lymphoma) ([place])    nhl dx 9/11  . Obesity    exogenous  . Shingles   . Sleep apnea    CPAP machine is broken    Patient Active Problem List   Diagnosis Date Noted  . Acute on chronic respiratory failure with hypoxia ([place]) 12/04/2016  . Thrombocytopenia ([place]) 11/23/2016  . Memory loss 04/13/2016  . Hypomagnesemia   . Morbid obesity ([place]) 03/29/2016  . Hypoglycemia 03/28/2016  . Acute encephalopathy 03/28/2016  . Acute diastolic  heart failure ([place]) 03/28/2016  . COPD mixed type ([place]) 03/28/2016  . Atrial fibrillation with RVR ([place]) 03/28/2016  . 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 Chronic diastolic heart failure ([place])   . CAP (community acquired pneumonia)   . Sepsis ([place]) 06/07/2015  . Diabetic neuropathy, type II diabetes mellitus ([place]) 09/25/2014  . Obstructive sleep apnea 05/25/2014  . Benign neoplasm of ascending colon 05/05/2014  . Excessive daytime sleepiness 01/24/2014  . Mediastinal adenopathy 06/20/2013  . Severe obesity (BMI >= 40) ([place]) 05/01/2013  . Generalized anxiety disorder 05/01/2013  . Lung nodules 03/11/2013  . Chronic anticoagulation 08/20/2012  . NHL (non-Hodgkin's lymphoma) ([place]) 01/26/2010  . Obesity, morbid ([place]) 04/27/2009  . CARDIOVASCULAR STUDIES, ABNORMAL 04/27/2009  . ABNORMAL STRESS ELECTROCARDIOGRAM 03/25/2009  . PERSONAL HISTORY OF COLONIC POLYPS 03/02/2009  . Diabetes mellitus type 2, insulin dependent ([place]) 04/29/2007  . Hyperlipidemia 04/29/2007  . Hypertension 04/29/2007  . Seasonal and perennial allergic rhinitis 04/29/2007  . COPD exacerbation ([place]) 04/29/2007  . G E R D 04/29/2007  . BENIGN PROSTATIC HYPERTROPHY, HX OF 04/29/2007    Past Surgical History:  Procedure Laterality Date  . APPENDECTOMY    . COLONOSCOPY WITH PROPOFOL N/A 05/05/2014   Procedure: COLONOSCOPY WITH PROPOFOL;  Surgeon: Inda Castle, MD;  Location: [facility] ENDOSCOPY;  Service: Endoscopy;  Laterality: N/A;       Home Medications    Prior to Admission medications   Medication Sig Start Date End Date Taking? Authorizing Provider  albuterol [facility] HFA) 108 (90 Base) MCG/ACT inhaler Inhale 2 puffs every 4-6 hours -rescue inhaler 03/27/17  Yes Chipper Herb, MD  ALPRAZolam Duanne Moron) 0.25 MG tablet TAKE 1 TABLET BY MOUTH THREE TIMES DAILY AS NEEDED FOR  ANXIETY 03/01/17  Yes Chipper Herb, MD  Cholecalciferol (VITAMIN D3) 5000 UNITS TABS Take 1 tablet by mouth every morning.   Yes [provider]  fenofibrate 160 MG tablet Take 1 tablet (160 mg total) by mouth daily. 03/28/17  Yes Chipper Herb, MD  furosemide (LASIX) 40 MG tablet Take 1 tablet (40 mg total) by mouth 2 (two) times daily. As directed 03/27/17  Yes Chipper Herb, MD  guaiFENesin (MUCINEX) 600 MG 12 hr tablet Take 600 mg by mouth 2 (two) times daily as needed for cough or to loosen phlegm.   Yes [provider]  insulin NPH Human (NOVOLIN N) 100 UNIT/ML injection Inject 0.5 mLs (50 Units total) into the skin daily before breakfast. 03/27/17  Yes Chipper Herb, MD  insulin regular (NOVOLIN R,HUMULIN R) 100 units/mL injection 6 units prior to each meal 03/27/17  Yes Chipper Herb, MD  ipratropium-albuterol (DUONEB) 0.5-2.5 (3) MG/3ML SOLN PLACE 1 VIAL (3 MLS) IN THE NEBULIZER EVERY 6 HOURS AS NEEDED 03/27/17  Yes Chipper Herb, MD  metFORMIN (GLUCOPHAGE) 500 MG tablet TAKE 1 TABLET BY MOUTH IN THE MORNING AND 2 IN THE EVENING Patient taking differently: Take 500-1,000 mg by mouth 2 (two) times daily. TAKE 1 TABLET BY MOUTH IN THE MORNING AND 2 IN THE EVENING 03/27/17  Yes Chipper Herb, MD  metoprolol tartrate (LOPRESSOR) 100 MG tablet Take 1 tablet (100 mg total) by mouth 2 (two) times daily. 03/27/17  Yes Chipper Herb, MD  PARoxetine (PAXIL) 20 MG tablet Take 1 tablet (20 mg total) by mouth daily. 03/27/17  Yes Chipper Herb, MD  simvastatin (ZOCOR) 40 MG tablet Take 1 tablet (40 mg total) by mouth daily. 03/27/17  Yes Chipper Herb, MD  warfarin (COUMADIN) 5 MG tablet Take 1 tablet (5 mg total) by mouth daily. Or as directed by anticoagulation clinic 03/27/17  Yes Chipper Herb, MD  Blood Glucose Monitoring Suppl (CLEVER CHEK AUTO-CODE VOICE) DEVI USE AS DIRECTED 11/13/12   Chipper Herb, MD  Lancet Devices (LANCING DEVICE) MISC USE AS DIRECTED 11/13/12   Chipper Herb, MD    predniSONE (DELTASONE) 20 MG tablet Take 1 tablet (20 mg total) by mouth 2 (two) times daily. 04/16/17   Daleen Bo, MD    Family History Family History  Problem Relation Age of Onset  . Liver cancer Mother   . Diabetes Mother   . Heart disease Mother   . Colon cancer Father   . Prostate cancer Father   . Colon polyps Father   . Pulmonary embolism Sister   . Diabetes Sister   . Heart attack Sister   . Aortic aneurysm Sister   . COPD Sister   . Heart disease Sister   . COPD Sister   . Heart disease Sister   . Diabetes Maternal Grandmother     Social History Social History   Tobacco Use  . Smoking status: Former Smoker    Packs/day: 2.00    Years: 40.00  Pack years: 80.00    Start date: 12/17/1968    Last attempt to quit: 11/14/2004    Years since quitting: 12.4  . Smokeless tobacco: Never Used  . Tobacco comment: 2 ppd   Substance Use Topics  . Alcohol use: No    Alcohol/week: 0.0 oz    Comment: previous  . Drug use: No     Allergies   Avapro [irbesartan] and Lipitor [atorvastatin calcium]   Review of Systems Review of Systems  All other systems reviewed and are negative.    Physical Exam Updated Vital Signs BP 118/72   Pulse 86   Temp 97.8 F (36.6 C) (Axillary)   Resp 18   Ht 5\' 11"  (1.803 m)   Wt 127 kg (280 lb)   SpO2 94%   BMI 39.05 kg/m   Physical Exam  Constitutional: He is oriented to person, place, and time. He appears well-developed.  Obese  HENT:  Head: Normocephalic and atraumatic.  Right Ear: External ear normal.  Left Ear: External ear normal.  Eyes: Conjunctivae and EOM are normal. Pupils are equal, round, and reactive to light.  Neck: Normal range of motion and phonation normal. Neck supple.  Cardiovascular: Normal rate, regular rhythm and normal heart sounds.  Pulmonary/Chest: Accessory muscle usage present. Tachypnea noted. He is in respiratory distress. He has decreased breath sounds in the right upper field, the  right middle field, the right lower field, the left upper field, the left middle field and the left lower field. He has no wheezes. He has no rhonchi. He has no rales. He exhibits no bony tenderness.  Moderate increased work of breathing, tripod position upon arrival.  Abdominal: Soft. There is no tenderness.  Musculoskeletal: Normal range of motion.       Right lower leg: He exhibits edema.       Left lower leg: He exhibits edema.  Neurological: He is alert and oriented to person, place, and time. No cranial nerve deficit or sensory deficit. He exhibits normal muscle tone. Coordination normal.  Skin: Skin is warm, dry and intact.  Psychiatric: He has a normal mood and affect. His behavior is normal. Judgment and thought content normal.  Nursing note and vitals reviewed.    ED Treatments / Results  Labs (all labs ordered are listed, but only abnormal results are displayed) Labs Reviewed  BASIC METABOLIC PANEL - Abnormal; Notable for the following components:      Result Value   Chloride 94 (*)    CO2 37 (*)    Glucose, Bld 326 (*)    BUN 23 (*)    All other components within normal limits  CBC WITH DIFFERENTIAL/PLATELET  TROPONIN I    EKG  EKG Interpretation  Date/Time:  Sunday April 16 2017 18:28:59 EST Ventricular Rate:  102 PR Interval:    QRS Duration: 145 QT Interval:  340 QTC Calculation: 421 R Axis:   100 Text Interpretation:  Atrial fibrillation Ventricular premature complex Nonspecific intraventricular conduction delay since last tracing no significant change Confirmed by Daleen Bo [phone]) on 04/16/2017 7:02:52 PM       Radiology Dg Chest Port 1 View  Result Date: 04/16/2017 CLINICAL DATA:  Short of breath with exertion, worsening today. EXAM: PORTABLE CHEST 1 VIEW COMPARISON:  12/04/2016 FINDINGS: Cardiac silhouette is mildly enlarged. No mediastinal or hilar masses. There are prominent bronchovascular markings in the lower lungs similar to prior  studies. No evidence of pneumonia or pulmonary edema. No pleural effusion or pneumothorax. Skeletal  structures are grossly intact. IMPRESSION: No acute cardiopulmonary disease. Electronically Signed   By: Lajean Manes M.D.   On: 04/16/2017 19:03    Procedures Procedures (including critical care time)  Medications Ordered in ED Medications  albuterol (PROVENTIL,VENTOLIN) solution continuous neb (20 mg/hr Nebulization Given 04/16/17 1841)  methylPREDNISolone sodium succinate (SOLU-MEDROL) 125 mg/2 mL injection 125 mg (125 mg Intravenous Given 04/16/17 1845)     Initial Impression / Assessment and Plan / ED Course  I have reviewed the triage vital signs and the nursing notes.  Pertinent labs & imaging results that were available during my care of the patient were reviewed by me and considered in my medical decision making (see chart for details).  Clinical Course as of Apr 16 2104  Nancy Fetter Apr 16, 2017  2831 Patient with shortness of breath, currently oxygen saturation 70% on 2 L nasal cannula.  He complains of shortness of breath worse than usual, which started today And was not amenable to his usual home treatments.  He is talking in forced sentences.  [EW]  1903 He is resting more comfortably, currently on continuous nebulizer, in semi-fowler position, on stretcher.  [EW]    Clinical Course User Index [EW] Daleen Bo, MD     Patient Vitals for the past 24 hrs:  BP Temp Temp src Pulse Resp SpO2 Height Weight  04/16/17 2024 - - - - - 94 % - -  04/16/17 1932 - 97.8 F (36.6 C) Axillary - - - - -  04/16/17 1900 118/72 - - 86 18 90 % - -  04/16/17 1842 - - - - - 100 % - -  04/16/17 1830 136/87 - - 86 (!) 25 100 % - -  04/16/17 1829 - - - - - - 5\' 11"  (1.803 m) 127 kg (280 lb)    8:43 PM Reevaluation with update and discussion. After initial assessment and treatment, an updated evaluation reveals patient states he is comfortable and feels like he is at his baseline, and ready to go  home.  Oxygen saturation on 3 L nasal cannula is 93%, normal.  Patient now states he did not take his medications for hyperglycemia, because he did not eat much today.  Findings discussed with patient, and wife, all questions were answered. Daleen Bo     Final Clinical Impressions(s) / ED Diagnoses   Final diagnoses:  COPD exacerbation ([place])   Evaluation consistent with COPD exacerbation.  No clear etiology.  Doubt pneumonia, PE or metabolic instability.  Patient is coughing without mucus production.  Doubt significant bacterial infection.  Elevated blood sugar related to not taking her usual medication today.  Nursing Notes Reviewed/ Care Coordinated Applicable Imaging Reviewed Interpretation of Laboratory Data incorporated into ED treatment  The patient appears reasonably screened and/or stabilized for discharge and I doubt any other medical condition or other [facility] requiring further screening, evaluation, or treatment in the ED at this time prior to discharge.  Plan: Home Medications-continue current medications; Home Treatments-rest, fluids; return here if the recommended treatment, does not improve the symptoms; Recommended follow up-PCP, parent   ED Discharge Orders        Ordered    predniSONE (DELTASONE) 20 MG tablet  2 times daily     04/16/17 2104       Daleen Bo, MD 04/16/17 2105

## 2017-04-16 NOTE — ED Triage Notes (Signed)
PT c/o SOB with exertion worsening today. PT is on 3L per N/C continuously at home. PT arrived to ED entrance this pm without oxygen on and saturation was 65% on room air. EDP present at bedside at this time.

## 2017-04-16 NOTE — Discharge Instructions (Signed)
Use your nebulizer at home, every 3 or 4 hours as needed for cough or trouble breathing.  Use your oxygen as usual.  Start the prednisone prescription tomorrow morning.  Make sure that you are taking all of your medications, and watch your carbohydrate intake, because prednisone can elevate your blood sugar.

## 2017-04-27 ENCOUNTER — Ambulatory Visit (INDEPENDENT_AMBULATORY_CARE_PROVIDER_SITE_OTHER): Payer: Medicare HMO | Admitting: *Deleted

## 2017-04-27 DIAGNOSIS — I4891 Unspecified atrial fibrillation: Secondary | ICD-10-CM | POA: Diagnosis not present

## 2017-04-27 DIAGNOSIS — Z7901 Long term (current) use of anticoagulants: Secondary | ICD-10-CM

## 2017-04-27 LAB — COAGUCHEK XS/INR WAIVED
INR: 2.6 — AB (ref 0.9–1.1)
Prothrombin Time: 30.6 s

## 2017-04-27 NOTE — Progress Notes (Signed)
Subjective:     Indication: atrial fibrillation Bleeding signs/symptoms: None Thromboembolic signs/symptoms: None  Missed Coumadin doses: None Medication changes: no Dietary changes: no Bacterial/viral infection: no Other concerns: no  The following portions of the patient's history were reviewed and updated as appropriate: allergies and current medications.  Review of Systems Pertinent items are noted in HPI.   Objective:    INR Today: 2.6 Current dose: 7.5mg  on Tues, Thurs, Fri and 5mg  all other days  Assessment:    Therapeutic INR for goal of 2-3   Plan:    1. New dose: no change   2. Next INR: 1 month    [name], RN

## 2017-05-07 DIAGNOSIS — J441 Chronic obstructive pulmonary disease with (acute) exacerbation: Secondary | ICD-10-CM | POA: Diagnosis not present

## 2017-05-07 DIAGNOSIS — M6281 Muscle weakness (generalized): Secondary | ICD-10-CM | POA: Diagnosis not present

## 2017-05-07 DIAGNOSIS — J449 Chronic obstructive pulmonary disease, unspecified: Secondary | ICD-10-CM | POA: Diagnosis not present

## 2017-05-07 DIAGNOSIS — I5040 Unspecified combined systolic (congestive) and diastolic (congestive) heart failure: Secondary | ICD-10-CM | POA: Diagnosis not present

## 2017-05-07 DIAGNOSIS — R531 Weakness: Secondary | ICD-10-CM | POA: Diagnosis not present

## 2017-05-11 DIAGNOSIS — J449 Chronic obstructive pulmonary disease, unspecified: Secondary | ICD-10-CM | POA: Diagnosis not present

## 2017-05-11 DIAGNOSIS — J9611 Chronic respiratory failure with hypoxia: Secondary | ICD-10-CM | POA: Diagnosis not present

## 2017-05-11 DIAGNOSIS — J439 Emphysema, unspecified: Secondary | ICD-10-CM | POA: Diagnosis not present

## 2017-05-18 ENCOUNTER — Encounter: Payer: Self-pay | Admitting: Internal Medicine

## 2017-05-18 ENCOUNTER — Inpatient Hospital Stay: Payer: Medicare HMO

## 2017-05-18 ENCOUNTER — Telehealth: Payer: Self-pay | Admitting: Internal Medicine

## 2017-05-18 ENCOUNTER — Inpatient Hospital Stay: Payer: Medicare HMO | Attending: Internal Medicine | Admitting: Internal Medicine

## 2017-05-18 VITALS — BP 120/75 | HR 87 | Temp 98.9°F | Resp 16 | Ht 71.0 in | Wt 292.8 lb

## 2017-05-18 DIAGNOSIS — E785 Hyperlipidemia, unspecified: Secondary | ICD-10-CM | POA: Diagnosis not present

## 2017-05-18 DIAGNOSIS — Z7901 Long term (current) use of anticoagulants: Secondary | ICD-10-CM | POA: Diagnosis not present

## 2017-05-18 DIAGNOSIS — D649 Anemia, unspecified: Secondary | ICD-10-CM | POA: Diagnosis not present

## 2017-05-18 DIAGNOSIS — Z9981 Dependence on supplemental oxygen: Secondary | ICD-10-CM | POA: Insufficient documentation

## 2017-05-18 DIAGNOSIS — Z8572 Personal history of non-Hodgkin lymphomas: Secondary | ICD-10-CM | POA: Insufficient documentation

## 2017-05-18 DIAGNOSIS — E669 Obesity, unspecified: Secondary | ICD-10-CM | POA: Insufficient documentation

## 2017-05-18 DIAGNOSIS — Z79899 Other long term (current) drug therapy: Secondary | ICD-10-CM | POA: Diagnosis not present

## 2017-05-18 DIAGNOSIS — K219 Gastro-esophageal reflux disease without esophagitis: Secondary | ICD-10-CM | POA: Diagnosis not present

## 2017-05-18 DIAGNOSIS — Z794 Long term (current) use of insulin: Secondary | ICD-10-CM | POA: Diagnosis not present

## 2017-05-18 DIAGNOSIS — I4891 Unspecified atrial fibrillation: Secondary | ICD-10-CM | POA: Insufficient documentation

## 2017-05-18 DIAGNOSIS — E1165 Type 2 diabetes mellitus with hyperglycemia: Secondary | ICD-10-CM | POA: Diagnosis not present

## 2017-05-18 DIAGNOSIS — Z86010 Personal history of colonic polyps: Secondary | ICD-10-CM | POA: Insufficient documentation

## 2017-05-18 DIAGNOSIS — C8332 Diffuse large B-cell lymphoma, intrathoracic lymph nodes: Secondary | ICD-10-CM

## 2017-05-18 DIAGNOSIS — N400 Enlarged prostate without lower urinary tract symptoms: Secondary | ICD-10-CM | POA: Insufficient documentation

## 2017-05-18 DIAGNOSIS — J449 Chronic obstructive pulmonary disease, unspecified: Secondary | ICD-10-CM

## 2017-05-18 DIAGNOSIS — I10 Essential (primary) hypertension: Secondary | ICD-10-CM | POA: Insufficient documentation

## 2017-05-18 DIAGNOSIS — G4730 Sleep apnea, unspecified: Secondary | ICD-10-CM | POA: Insufficient documentation

## 2017-05-18 LAB — COMPREHENSIVE METABOLIC PANEL
ALBUMIN: 3 g/dL — AB (ref 3.5–5.0)
ALK PHOS: 72 U/L (ref 40–150)
ALT: 11 U/L (ref 0–55)
AST: 14 U/L (ref 5–34)
Anion gap: 10 (ref 3–11)
BUN: 31 mg/dL — ABNORMAL HIGH (ref 7–26)
CO2: 37 mmol/L — AB (ref 22–29)
CREATININE: 1.26 mg/dL (ref 0.70–1.30)
Calcium: 8.3 mg/dL — ABNORMAL LOW (ref 8.4–10.4)
Chloride: 89 mmol/L — ABNORMAL LOW (ref 98–109)
GFR calc Af Amer: >60 mL/min (ref >60)
GFR calc non Af Amer: 58 mL/min — ABNORMAL LOW (ref >60)
GLUCOSE: 356 mg/dL — AB (ref 70–140)
Potassium: 3.9 mmol/L (ref 3.5–5.1)
SODIUM: 136 mmol/L (ref 136–145)
Total Bilirubin: 0.8 mg/dL (ref 0.2–1.2)
Total Protein: 6.5 g/dL (ref 6.4–8.3)

## 2017-05-18 LAB — CBC WITH DIFFERENTIAL/PLATELET
BASOS PCT: 0 %
Basophils Absolute: 0 10*3/uL (ref 0.0–0.1)
Eosinophils Absolute: 0.1 10*3/uL (ref 0.0–0.5)
Eosinophils Relative: 1 %
HEMATOCRIT: 37.3 % — AB (ref 38.4–49.9)
Hemoglobin: 12.1 g/dL — ABNORMAL LOW (ref 13.0–17.1)
LYMPHS ABS: 1.6 10*3/uL (ref 0.9–3.3)
Lymphocytes Relative: 26 %
MCH: 30 pg (ref 27.2–33.4)
MCHC: 32.4 g/dL (ref 32.0–36.0)
MCV: 92.6 fL (ref 79.3–98.0)
MONO ABS: 0.5 10*3/uL (ref 0.1–0.9)
MONOS PCT: 8 %
NEUTROS ABS: 4 10*3/uL (ref 1.5–6.5)
Neutrophils Relative %: 65 %
Platelets: 156 10*3/uL (ref 140–400)
RBC: 4.03 MIL/uL — ABNORMAL LOW (ref 4.20–5.82)
RDW: 13.5 % (ref 11.0–15.6)
WBC: 6.1 10*3/uL (ref 4.0–10.3)

## 2017-05-18 LAB — LACTATE DEHYDROGENASE: LDH: 188 U/L (ref 125–245)

## 2017-05-18 NOTE — Progress Notes (Signed)
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 2) hypermetabolic mediastinal lymphadenopathy. 3) monoclonal gammopathy of undetermined significance  PRIOR THERAPY: Status post systemic chemotherapy with 6 cycles of infusional CHOPE with Rituxan.  CURRENT THERAPY: Observation  INTERVAL HISTORY: Justin Ruiz 67 y.o. male returns to the clinic today for follow-up visit.  The patient is feeling fine today with no specific complaints except for the baseline shortness of breath and he is currently on home oxygen secondary to COPD.  He denied having any recent chest pain, cough or hemoptysis.  He denied having any weight loss or night sweats.  He has no nausea, vomiting, diarrhea or constipation.  He denied having any palpable lymphadenopathy.  He is here today for evaluation and repeat blood work.   MEDICAL HISTORY: Past Medical History:  Diagnosis Date  . Arthritis   . Asthma   . Atrial fibrillation ([place])    on AC  . BPH (benign prostatic hypertrophy)   . Colon polyps   . COPD (chronic obstructive pulmonary disease) ([place])    on 2.5-3L home O2  . Diabetes mellitus   . GERD (gastroesophageal reflux disease)   . HTN (hypertension)    x 3 years  . Hyperlipidemia   . Lung nodules 03/11/2013  . Mediastinal adenopathy 06/20/2013   CT  & PET 2/15   . Memory loss   . 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 NHL (non-Hodgkin's lymphoma) ([place])    nhl dx 9/11  . Obesity    exogenous  . Shingles   . Sleep apnea    CPAP machine is broken    ALLERGIES:  is allergic to avapro [irbesartan] and lipitor [atorvastatin calcium].  MEDICATIONS:  Current Outpatient Medications  Medication Sig Dispense Refill  . albuterol (PROAIR HFA) 108 (90 Base) MCG/ACT  inhaler Inhale 2 puffs every 4-6 hours -rescue inhaler 8.5 g 12  . ALPRAZolam (XANAX) 0.25 MG tablet TAKE 1 TABLET BY MOUTH THREE TIMES DAILY AS NEEDED FOR  ANXIETY 90 tablet 1  . Blood Glucose Monitoring Suppl (CLEVER CHEK AUTO-CODE VOICE) DEVI USE AS DIRECTED 1 each 2  . Cholecalciferol (VITAMIN D3) 5000 UNITS TABS Take 1 tablet by mouth every morning.    . fenofibrate 160 MG tablet Take 1 tablet (160 mg total) by mouth daily. 90 tablet 1  . furosemide (LASIX) 40 MG tablet Take 1 tablet (40 mg total) by mouth 2 (two) times daily. As directed 60 tablet 6  . guaiFENesin (MUCINEX) 600 MG 12 hr tablet Take 600 mg by mouth 2 (two) times daily as needed for cough or to loosen phlegm.    . insulin NPH Human (NOVOLIN N) 100 UNIT/ML injection Inject 0.5 mLs (50 Units total) into the skin daily before breakfast. 20 mL 1  . insulin regular (NOVOLIN R,HUMULIN R) 100 units/mL injection 6 units prior to each meal 10 mL 1  . ipratropium-albuterol (DUONEB) 0.5-2.5 (3) MG/3ML SOLN PLACE 1 VIAL (3 MLS) IN THE NEBULIZER EVERY 6 HOURS AS NEEDED 1080 mL 0  . Lancet Devices (LANCING DEVICE) MISC USE AS DIRECTED 1 each 2  . metFORMIN (GLUCOPHAGE) 500 MG tablet TAKE 1 TABLET BY MOUTH IN THE MORNING AND 2 IN THE EVENING (Patient taking differently: Take 500-1,000 mg by mouth 2 (two) times  daily. TAKE 1 TABLET BY MOUTH IN THE MORNING AND 2 IN THE EVENING) 270 tablet 3  . metoprolol tartrate (LOPRESSOR) 100 MG tablet Take 1 tablet (100 mg total) by mouth 2 (two) times daily. 180 tablet 3  . PARoxetine (PAXIL) 20 MG tablet Take 1 tablet (20 mg total) by mouth daily. 90 tablet 3  . predniSONE (DELTASONE) 20 MG tablet Take 1 tablet (20 mg total) by mouth 2 (two) times daily. 10 tablet 0  . simvastatin (ZOCOR) 40 MG tablet Take 1 tablet (40 mg total) by mouth daily. 90 tablet 3  . warfarin (COUMADIN) 5 MG tablet Take 1 tablet (5 mg total) by mouth daily. Or as directed by anticoagulation clinic 90 tablet 3   No current  facility-administered medications for this visit.     SURGICAL HISTORY:  Past Surgical History:  Procedure Laterality Date  . APPENDECTOMY    . COLONOSCOPY WITH PROPOFOL N/A 05/05/2014   Procedure: COLONOSCOPY WITH PROPOFOL;  Surgeon: Inda Castle, MD;  Location: [facility];  Service: Endoscopy;  Laterality: N/A;    REVIEW OF SYSTEMS:  A comprehensive review of systems was negative except for: Constitutional: positive for fatigue Respiratory: positive for dyspnea on exertion   PHYSICAL EXAMINATION: General appearance: alert, cooperative and no distress Head: Normocephalic, without obvious abnormality, atraumatic Neck: no adenopathy, no JVD, supple, symmetrical, trachea midline and thyroid not enlarged, symmetric, no tenderness/mass/nodules Lymph nodes: Cervical, supraclavicular, and axillary nodes normal. Resp: clear to auscultation bilaterally Back: symmetric, no curvature. ROM normal. No CVA tenderness. Cardio: regular rate and rhythm, S1, S2 normal, no murmur, click, rub or gallop GI: soft, non-tender; bowel sounds normal; no masses,  no organomegaly Extremities: extremities normal, atraumatic, no cyanosis or edema  ECOG PERFORMANCE STATUS: 1 - Symptomatic but completely ambulatory  Blood pressure 120/75, pulse 87, temperature 98.9 F (37.2 C), temperature source Oral, resp. rate 16, height 5\' 11"  (1.803 m), weight 292 lb 12.8 oz (132.8 kg), SpO2 94 %.  LABORATORY DATA: Lab Results  Component Value Date   WBC 6.1 05/18/2017   HGB 12.1 (L) 05/18/2017   HCT 37.3 (L) 05/18/2017   MCV 92.6 05/18/2017   PLT 156 05/18/2017      Chemistry      Component Value Date/Time   NA 136 05/18/2017 1053   NA 139 03/27/2017 1149   NA 139 05/19/2016 0933   K 3.9 05/18/2017 1053   K 4.0 05/19/2016 0933   CL 89 (L) 05/18/2017 1053   CL 100 09/03/2012 0751   CO2 37 (H) 05/18/2017 1053   CO2 32 (H) 05/19/2016 0933   BUN 31 (H) 05/18/2017 1053   BUN 29 (H) 03/27/2017 1149   BUN  25.3 05/19/2016 0933   CREATININE 1.26 05/18/2017 1053   CREATININE 1.2 05/19/2016 0933   GLU 185 03/29/2012      Component Value Date/Time   CALCIUM 8.3 (L) 05/18/2017 1053   CALCIUM 10.4 05/19/2016 0933   ALKPHOS 72 05/18/2017 1053   ALKPHOS 90 05/19/2016 0933   AST 14 05/18/2017 1053   AST 19 05/19/2016 0933   ALT 11 05/18/2017 1053   ALT 14 05/19/2016 0933   BILITOT 0.8 05/18/2017 1053   BILITOT 0.4 03/27/2017 1149   BILITOT 1.21 (H) 05/19/2016 0933       RADIOGRAPHIC STUDIES: No results found.  ASSESSMENT AND PLAN:   this is a very pleasant 67 years old white male with history of high-grade B-cell non-Hodgkin lymphoma , suspicious for Burkitt's lymphoma but  was negative cytogenetics for 8;14 abnormality. The patient is status post 6 cycles of systemic chemotherapy with choppy /Rituxan completed in 2012. The patient has been in observation since that time. He is feeling fine except for the baseline shortness of breath secondary to COPD and he is currently on home oxygen. CBC today is unremarkable except for mild anemia.  I recommended for him to start taking oral iron tablets. Comprehensive metabolic panel is significant for hyperglycemia and I strongly encouraged the patient to take his insulin and antidiabetic medications as prescribed by his primary care physician and to monitor it closely at home. The patient was advised to call immediately if he has any concerning symptoms in the interval. The patient voices understanding of current disease status and treatment options and is in agreement with the current care plan. All questions were answered. The patient knows to call the clinic with any problems, questions or concerns. We can certainly see the patient much sooner if necessary. I spent 10 minutes counseling the patient face to face. The total time spent in the appointment was 15 minutes.   Disclaimer: This note was dictated with voice recognition software. Similar  sounding words can inadvertently be transcribed and may not be corrected upon review.

## 2017-05-18 NOTE — Telephone Encounter (Signed)
Scheduled appt per 1/24 los - Gave patient AVS and calender per los.  

## 2017-05-25 ENCOUNTER — Encounter: Payer: Medicare HMO | Admitting: *Deleted

## 2017-05-26 ENCOUNTER — Ambulatory Visit (INDEPENDENT_AMBULATORY_CARE_PROVIDER_SITE_OTHER): Payer: Medicare HMO | Admitting: *Deleted

## 2017-05-26 DIAGNOSIS — Z7901 Long term (current) use of anticoagulants: Secondary | ICD-10-CM

## 2017-05-26 DIAGNOSIS — I4891 Unspecified atrial fibrillation: Secondary | ICD-10-CM | POA: Diagnosis not present

## 2017-05-26 LAB — COAGUCHEK XS/INR WAIVED
INR: 1.9 — ABNORMAL HIGH (ref 0.9–1.1)
PROTHROMBIN TIME: 22.6 s

## 2017-05-26 NOTE — Progress Notes (Signed)
Subjective:     Indication: atrial fibrillation Bleeding signs/symptoms: None Thromboembolic signs/symptoms: None  Missed Coumadin doses: None Medication changes: no Dietary changes: no Bacterial/viral infection: no Other concerns: no  The following portions of the patient's history were reviewed and updated as appropriate: allergies and current medications.  Review of Systems Pertinent items are noted in HPI.   Objective:    INR Today: 1.9 Current dose: 7.5mg  on Tues, Thurs, and Fri and 5 mg all other days    Assessment:    Subtherapeutic INR for goal of 2-3   Plan:    1. New dose: no change   2. Next INR: 2 weeks with Jinny Sanders, RN

## 2017-05-28 ENCOUNTER — Encounter: Payer: Self-pay | Admitting: Family Medicine

## 2017-05-28 DIAGNOSIS — E11319 Type 2 diabetes mellitus with unspecified diabetic retinopathy without macular edema: Secondary | ICD-10-CM | POA: Insufficient documentation

## 2017-05-28 HISTORY — DX: Type 2 diabetes mellitus with unspecified diabetic retinopathy without macular edema: E11.319

## 2017-06-07 DIAGNOSIS — R531 Weakness: Secondary | ICD-10-CM | POA: Diagnosis not present

## 2017-06-07 DIAGNOSIS — J449 Chronic obstructive pulmonary disease, unspecified: Secondary | ICD-10-CM | POA: Diagnosis not present

## 2017-06-07 DIAGNOSIS — M6281 Muscle weakness (generalized): Secondary | ICD-10-CM | POA: Diagnosis not present

## 2017-06-07 DIAGNOSIS — I5040 Unspecified combined systolic (congestive) and diastolic (congestive) heart failure: Secondary | ICD-10-CM | POA: Diagnosis not present

## 2017-06-07 DIAGNOSIS — J441 Chronic obstructive pulmonary disease with (acute) exacerbation: Secondary | ICD-10-CM | POA: Diagnosis not present

## 2017-06-08 ENCOUNTER — Ambulatory Visit: Payer: Medicare HMO

## 2017-06-09 ENCOUNTER — Ambulatory Visit (INDEPENDENT_AMBULATORY_CARE_PROVIDER_SITE_OTHER): Payer: Medicare HMO | Admitting: Pharmacist Clinician (PhC)/ Clinical Pharmacy Specialist

## 2017-06-09 ENCOUNTER — Encounter: Payer: Medicare HMO | Admitting: Pharmacist Clinician (PhC)/ Clinical Pharmacy Specialist

## 2017-06-09 DIAGNOSIS — I4891 Unspecified atrial fibrillation: Secondary | ICD-10-CM

## 2017-06-09 DIAGNOSIS — Z7901 Long term (current) use of anticoagulants: Secondary | ICD-10-CM | POA: Diagnosis not present

## 2017-06-09 LAB — COAGUCHEK XS/INR WAIVED
INR: 1.6 — AB (ref 0.9–1.1)
Prothrombin Time: 19.1 s

## 2017-06-09 NOTE — Patient Instructions (Addendum)
  Description   Change warfarin to taking 1 1/2 tablets a day except for Sundays and Thursdays take 1 tablet.  Your INR was 1.6 today (goal is 2-3)  Thick Today

## 2017-06-11 DIAGNOSIS — J439 Emphysema, unspecified: Secondary | ICD-10-CM | POA: Diagnosis not present

## 2017-06-11 DIAGNOSIS — J449 Chronic obstructive pulmonary disease, unspecified: Secondary | ICD-10-CM | POA: Diagnosis not present

## 2017-06-11 DIAGNOSIS — J9611 Chronic respiratory failure with hypoxia: Secondary | ICD-10-CM | POA: Diagnosis not present

## 2017-06-12 ENCOUNTER — Ambulatory Visit: Payer: Medicare HMO

## 2017-06-15 DIAGNOSIS — G4733 Obstructive sleep apnea (adult) (pediatric): Secondary | ICD-10-CM | POA: Diagnosis not present

## 2017-06-30 ENCOUNTER — Ambulatory Visit (INDEPENDENT_AMBULATORY_CARE_PROVIDER_SITE_OTHER): Payer: Medicare HMO | Admitting: Pharmacist Clinician (PhC)/ Clinical Pharmacy Specialist

## 2017-06-30 DIAGNOSIS — Z7901 Long term (current) use of anticoagulants: Secondary | ICD-10-CM | POA: Diagnosis not present

## 2017-06-30 DIAGNOSIS — I4891 Unspecified atrial fibrillation: Secondary | ICD-10-CM | POA: Diagnosis not present

## 2017-06-30 LAB — COAGUCHEK XS/INR WAIVED
INR: 2.5 — ABNORMAL HIGH (ref 0.9–1.1)
Prothrombin Time: 29.9 s

## 2017-06-30 NOTE — Patient Instructions (Signed)
Description   Continue taking 1 1/2 tablets a day except for Sundays and Thursdays take 1 tablet.  Your INR was 2.5  Goal is 2-3   Perfect reading today

## 2017-07-09 DIAGNOSIS — J439 Emphysema, unspecified: Secondary | ICD-10-CM | POA: Diagnosis not present

## 2017-07-09 DIAGNOSIS — J9611 Chronic respiratory failure with hypoxia: Secondary | ICD-10-CM | POA: Diagnosis not present

## 2017-07-09 DIAGNOSIS — J449 Chronic obstructive pulmonary disease, unspecified: Secondary | ICD-10-CM | POA: Diagnosis not present

## 2017-07-23 ENCOUNTER — Other Ambulatory Visit: Payer: Self-pay | Admitting: Family Medicine

## 2017-08-02 ENCOUNTER — Encounter: Payer: Self-pay | Admitting: Family Medicine

## 2017-08-02 ENCOUNTER — Ambulatory Visit (INDEPENDENT_AMBULATORY_CARE_PROVIDER_SITE_OTHER): Payer: Medicare HMO | Admitting: Family Medicine

## 2017-08-02 ENCOUNTER — Ambulatory Visit: Payer: Medicare HMO | Admitting: Family Medicine

## 2017-08-02 VITALS — BP 140/77 | HR 93 | Temp 97.0°F | Ht 71.0 in | Wt 299.0 lb

## 2017-08-02 DIAGNOSIS — E559 Vitamin D deficiency, unspecified: Secondary | ICD-10-CM

## 2017-08-02 DIAGNOSIS — C8590 Non-Hodgkin lymphoma, unspecified, unspecified site: Secondary | ICD-10-CM | POA: Diagnosis not present

## 2017-08-02 DIAGNOSIS — E7800 Pure hypercholesterolemia, unspecified: Secondary | ICD-10-CM

## 2017-08-02 DIAGNOSIS — E119 Type 2 diabetes mellitus without complications: Secondary | ICD-10-CM

## 2017-08-02 DIAGNOSIS — J9611 Chronic respiratory failure with hypoxia: Secondary | ICD-10-CM | POA: Diagnosis not present

## 2017-08-02 DIAGNOSIS — J449 Chronic obstructive pulmonary disease, unspecified: Secondary | ICD-10-CM | POA: Diagnosis not present

## 2017-08-02 DIAGNOSIS — C8332 Diffuse large B-cell lymphoma, intrathoracic lymph nodes: Secondary | ICD-10-CM

## 2017-08-02 DIAGNOSIS — I739 Peripheral vascular disease, unspecified: Secondary | ICD-10-CM | POA: Diagnosis not present

## 2017-08-02 DIAGNOSIS — Z794 Long term (current) use of insulin: Secondary | ICD-10-CM

## 2017-08-02 DIAGNOSIS — D696 Thrombocytopenia, unspecified: Secondary | ICD-10-CM | POA: Diagnosis not present

## 2017-08-02 DIAGNOSIS — I5032 Chronic diastolic (congestive) heart failure: Secondary | ICD-10-CM | POA: Diagnosis not present

## 2017-08-02 DIAGNOSIS — I4891 Unspecified atrial fibrillation: Secondary | ICD-10-CM | POA: Diagnosis not present

## 2017-08-02 HISTORY — DX: Peripheral vascular disease, unspecified: I73.9

## 2017-08-02 LAB — COAGUCHEK XS/INR WAIVED
INR: 1.3 — AB (ref 0.9–1.1)
Prothrombin Time: 15.8 s

## 2017-08-02 LAB — BAYER DCA HB A1C WAIVED: HB A1C: 7.9 % — AB (ref <7.0)

## 2017-08-02 MED ORDER — FLUTICASONE PROPIONATE 50 MCG/ACT NA SUSP: 2.0000 | Freq: Every day | NASAL | 6 refills | Status: DC

## 2017-08-02 NOTE — Patient Instructions (Addendum)
Medicare Annual Wellness Visit  [facility] and the medical providers at [facility] strive to bring you the best medical care.  In doing so we not only want to address your current medical conditions and concerns but also to detect new conditions early and prevent illness, disease and health-related problems.    Medicare offers a yearly Wellness Visit which allows our clinical staff to assess your need for preventative services including immunizations, lifestyle education, counseling to decrease risk of preventable diseases and screening for fall risk and other medical concerns.    This visit is provided free of charge (no copay) for all Medicare recipients. The clinical pharmacists at [facility] have begun to conduct these Wellness Visits which will also include a thorough review of all your medications.    As you primary medical provider recommend that you make an appointment for your Annual Wellness Visit if you have not done so already this year.  You may set up this appointment before you leave today or you may call back ([phone]) and schedule an appointment.  Please make sure when you call that you mention that you are scheduling your Annual Wellness Visit with the clinical pharmacist so that the appointment may be made for the proper length of time.     Continue current medications. Continue good therapeutic lifestyle changes which include good diet and exercise. Fall precautions discussed with patient. If an FOBT was given today- please return it to our front desk. If you are over 67 years old - you may need Prevnar 58 or the adult Pneumonia vaccine.  **Flu shots are available--- please call and schedule a FLU-CLINIC appointment**  After your visit with [place] today you will receive a survey in the mail or online from [employer] regarding your care with [place]. Please take a moment to fill this out. Your feedback is very  important to [place] as you can help [place] better understand your patient needs as well as improve your experience and satisfaction. WE CARE ABOUT YOU!!!   Continue to follow-up with urology pulmonology cardiology and oncology as planned Start Flonase and use 2 sprays each nostril at bedtime Also add Claritin over-the-counter 1 daily Use nasal saline frequently in each nostril If breathing gets worse get back in touch with [place] and we may need to call in a short course of prednisone Keep using inhalers and Mucinex

## 2017-08-02 NOTE — Progress Notes (Signed)
Subjective:    Patient ID: Justin Ruiz, male    DOB: 26-May-1950, 67 y.o.   MRN: [id number]  HPI  Pt here for follow up and management of chronic medical problems which includes hyperlipidemia, afib and diabetes. He is taking medication regularly.  The patient comes to the visit today without his wife because she had to work.  He mainly complains with allergies and head congestion.  He will be given an FOBT to return and get his pro time today.  His wife would like to be called after the visit.  His blood pressure was slightly elevated and also on recheck at 140/77.  He is on 3 L of oxygen and his pulse ox ranges from 89-93%.  Patient is complaining with upper respiratory allergies and thinks this is having an adverse effect on his breathing.  He is not using any kind of nasal sprays or antihistamines currently.  He seems somewhat winded and he is on continuous O2 at 3 L/min.  He is alert and is giving me a good history record today of everything he is been doing.  He denies any chest pain.  He has his ongoing shortness of breath due to his COPD and asthma and respiratory failure.  He denies any trouble with his stomach including nausea vomiting diarrhea blood in the stool or black tarry bowel movements.  He is passing his water without problems.  He continues to be followed by a cardiologist the pulmonologist the urologist and our practice.  He is also followed by the oncologist for his diffuse large cell lymphoma.    Patient Active Problem List   Diagnosis Date Noted  . Diabetic retinopathy ([place]) 05/28/2017  . Acute on chronic respiratory failure with hypoxia ([place]) 12/04/2016  . Thrombocytopenia ([place]) 11/23/2016  . Memory loss 04/13/2016  . Hypomagnesemia   . Morbid obesity ([place]) 03/29/2016  . Hypoglycemia 03/28/2016  . Acute encephalopathy 03/28/2016  . Acute diastolic heart failure ([place]) 03/28/2016  . COPD mixed type ([place]) 03/28/2016  . Atrial fibrillation with RVR ([place]) 03/28/2016  .  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 Chronic diastolic heart failure ([place])   . CAP (community acquired pneumonia)   . Sepsis ([place]) 06/07/2015  . Diabetic neuropathy, type II diabetes mellitus ([place]) 09/25/2014  . Obstructive sleep apnea 05/25/2014  . Benign neoplasm of ascending colon 05/05/2014  . Excessive daytime sleepiness 01/24/2014  . Mediastinal adenopathy 06/20/2013  . Severe obesity (BMI >= 40) ([place]) 05/01/2013  . Generalized anxiety disorder 05/01/2013  . Lung nodules 03/11/2013  . Chronic anticoagulation 08/20/2012  . NHL (non-Hodgkin's lymphoma) ([place]) 01/26/2010  . Obesity, morbid ([place]) 04/27/2009  . CARDIOVASCULAR STUDIES, ABNORMAL 04/27/2009  . ABNORMAL STRESS ELECTROCARDIOGRAM 03/25/2009  . PERSONAL HISTORY OF COLONIC POLYPS 03/02/2009  . Diabetes mellitus type 2, insulin dependent ([place]) 04/29/2007  . Hyperlipidemia 04/29/2007  . Hypertension 04/29/2007  . Seasonal and perennial allergic rhinitis 04/29/2007  . COPD exacerbation ([place]) 04/29/2007  . G E R D 04/29/2007  . BENIGN PROSTATIC HYPERTROPHY, HX OF 04/29/2007   Outpatient Encounter Medications as of 08/02/2017  Medication Sig  . albuterol (PROAIR HFA) 108 (90 Base) MCG/ACT inhaler Inhale 2 puffs every 4-6 hours -rescue inhaler  . ALPRAZolam (XANAX) 0.25 MG tablet TAKE 1 TABLET BY MOUTH THREE TIMES DAILY AS NEEDED FOR ANXIETY  . Blood Glucose Monitoring Suppl (CLEVER CHEK AUTO-CODE VOICE) DEVI USE AS DIRECTED  .  Cholecalciferol (VITAMIN D3) 5000 UNITS TABS Take 1 tablet by mouth every morning.  . fenofibrate 160 MG tablet Take 1 tablet (160 mg total) by mouth daily.  . furosemide (LASIX) 40 MG tablet Take 1 tablet (40 mg total) by mouth 2 (two) times daily. As directed  . insulin NPH Human (NOVOLIN N) 100 UNIT/ML injection Inject 0.5 mLs (50 Units total) into the skin daily before breakfast.  . insulin  regular (NOVOLIN R,HUMULIN R) 100 units/mL injection 6 units prior to each meal  . ipratropium-albuterol (DUONEB) 0.5-2.5 (3) MG/3ML SOLN PLACE 1 VIAL (3 MLS) IN THE NEBULIZER EVERY 6 HOURS AS NEEDED  . Lancet Devices (LANCING DEVICE) MISC USE AS DIRECTED  . metFORMIN (GLUCOPHAGE) 500 MG tablet TAKE 1 TABLET BY MOUTH IN THE MORNING AND 2 IN THE EVENING (Patient taking differently: Take 500-1,000 mg by mouth 2 (two) times daily. TAKE 1 TABLET BY MOUTH IN THE MORNING AND 2 IN THE EVENING)  . metoprolol tartrate (LOPRESSOR) 100 MG tablet Take 1 tablet (100 mg total) by mouth 2 (two) times daily.  Marland Kitchen PARoxetine (PAXIL) 20 MG tablet Take 1 tablet (20 mg total) by mouth daily.  . predniSONE (DELTASONE) 20 MG tablet Take 1 tablet (20 mg total) by mouth 2 (two) times daily.  . simvastatin (ZOCOR) 40 MG tablet Take 1 tablet (40 mg total) by mouth daily.  Marland Kitchen warfarin (COUMADIN) 5 MG tablet Take 1 tablet (5 mg total) by mouth daily. Or as directed by anticoagulation clinic  . [DISCONTINUED] digoxin (LANOXIN) 0.25 MG tablet Take 250 mcg by mouth daily.   . [DISCONTINUED] guaiFENesin (MUCINEX) 600 MG 12 hr tablet Take 600 mg by mouth 2 (two) times daily as needed for cough or to loosen phlegm.   No facility-administered encounter medications on file as of 08/02/2017.       Review of Systems  Constitutional: Negative.   HENT: Positive for congestion (head congestion and allergies ).   Eyes: Negative.   Respiratory: Negative.   Cardiovascular: Negative.   Gastrointestinal: Negative.   Endocrine: Negative.   Genitourinary: Negative.   Musculoskeletal: Negative.   Skin: Negative.   Allergic/Immunologic: Negative.   Neurological: Negative.   Hematological: Negative.   Psychiatric/Behavioral: Negative.        Objective:   Physical Exam  Constitutional: He is oriented to person, place, and time. He appears well-developed and well-nourished.  The patient is pleasant and somewhat stressed due to his  ongoing use of O2 and respiratory failure.  He is alert and relates his medications well to me and who he has been seeing and how frequently.  HENT:  Head: Normocephalic and atraumatic.  Right Ear: External ear normal.  Left Ear: External ear normal.  Mouth/Throat: Oropharynx is clear and moist. No oropharyngeal exudate.  Some nasal congestion bilaterally left worse than right  Eyes: Pupils are equal, round, and reactive to light. Conjunctivae and EOM are normal. Right eye exhibits no discharge. Left eye exhibits no discharge. No scleral icterus.  Neck: Normal range of motion. Neck supple. No thyromegaly present.  No bruits thyromegaly or anterior cervical adenopathy  Cardiovascular: Normal rate and normal heart sounds. Exam reveals no gallop and no friction rub.  No murmur heard. The heart is irregular irregular at 84/min.  Pulse in the right lower extremity was difficult to palpate.  Pulmonary/Chest: Effort normal. No respiratory distress. He has wheezes. He has no rales. He exhibits no tenderness.  Diminished breath sounds bilaterally with sparse wheezes and no rales.  No axillary adenopathy.  Abdominal: Soft. Bowel sounds are normal. He exhibits no mass. There is no tenderness. There is no rebound and no guarding.  Abdominal obesity without masses tenderness or organ enlargement or bruits and normal bowel sounds present.  Musculoskeletal: Normal range of motion. He exhibits no edema or tenderness.  Bilateral pedal edema nonpitting  Lymphadenopathy:    He has no cervical adenopathy.  Neurological: He is alert and oriented to person, place, and time. He has normal reflexes. No cranial nerve deficit.  Skin: Skin is warm and dry. No rash noted.  Very dry skin and elongated nails need trimming  Psychiatric: He has a normal mood and affect. His behavior is normal. Judgment and thought content normal.  Nursing note and vitals reviewed.   BP 140/77 (BP Location: Left Arm)   Pulse 93   Temp  (!) 97 F (36.1 C) (Oral)   Ht '5\' 11"'  (1.803 m)   Wt 299 lb (135.6 kg)   SpO2 93% Comment: on 3 liters oxygen  BMI 41.70 kg/m        Assessment & Plan:  1. Atrial fibrillation with RVR ([place]) -Patient remains in atrial fibrillation.  He will continue to follow-up with cardiology and will get his pro time today. - CBC with Differential/Platelet - CoaguChek XS/INR Waived  2. Type 2 diabetes mellitus treated with insulin ([place]) -The patient indicates that his blood sugars have been running higher at home and he is currently taking insulin long acting and with meals.  We may have to make some adjustments when the A1c is returned. - BMP8+EGFR - CBC with Differential/Platelet - Bayer DCA Hb A1c Waived  3. Pure hypercholesterolemia -Continue with current treatment and with as aggressive therapeutic lifestyle changes as possible - CBC with Differential/Platelet - Lipid panel - Hepatic function panel  4. Vitamin D deficiency -Continue with vitamin D replacement pending results of lab work - CBC with Differential/Platelet - VITAMIN D 25 Hydroxy (Vit-D Deficiency, Fractures)  5. Chronic obstructive pulmonary disease, unspecified COPD type ([place]) -Avoid irritating environments and continue to follow-up with pulmonology as planned - CBC with Differential/Platelet  6. Morbid obesity ([place]) -The patient will continue with as aggressive therapeutic lifestyle changes as possible - CBC with Differential/Platelet  7. Thrombocytopenia ([facility]) -No bleeding issues per patient and no increased bruising noted. - CBC with Differential/Platelet  8. 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 Morbid obesity due to excess calories ([place]) -Watch diet closely keep blood sugars under control and get as much exercise as possible under the current circumstances with multiple medical issues  10. Non-Hodgkin's lymphoma in adult [facility]) -Follow-up  regularly with oncologist  11. Chronic respiratory failure with hypoxia ([facility]) -Continue with O2 and we will add Flonase and nasal steroid reorients to help with allergic rhinitis  12. Chronic diastolic heart failure ([place]) -Follow-up with cardiology as planned  13. Peripheral vascular insufficiency ([facility]) -Walk around the house as much as possible to improve circulation and elevate feet when possible  Patient Instructions                       Medicare Annual Wellness Visit  [facility] and the medical providers at [facility] strive to bring you the best medical care.  In doing so we not only want to address your current medical conditions and concerns but also to detect new conditions early and prevent illness, disease and health-related  problems.    Medicare offers a yearly Wellness Visit which allows our clinical staff to assess your need for preventative services including immunizations, lifestyle education, counseling to decrease risk of preventable diseases and screening for fall risk and other medical concerns.    This visit is provided free of charge (no copay) for all Medicare recipients. The clinical pharmacists at [facility] have begun to conduct these Wellness Visits which will also include a thorough review of all your medications.    As you primary medical provider recommend that you make an appointment for your Annual Wellness Visit if you have not done so already this year.  You may set up this appointment before you leave today or you may call back ([phone]) and schedule an appointment.  Please make sure when you call that you mention that you are scheduling your Annual Wellness Visit with the clinical pharmacist so that the appointment may be made for the proper length of time.     Continue current medications. Continue good therapeutic lifestyle changes which include good diet and exercise. Fall precautions discussed with  patient. If an FOBT was given today- please return it to our front desk. If you are over 53 years old - you may need Prevnar 5 or the adult Pneumonia vaccine.  **Flu shots are available--- please call and schedule a FLU-CLINIC appointment**  After your visit with [place] today you will receive a survey in the mail or online from [employer] regarding your care with [place]. Please take a moment to fill this out. Your feedback is very important to [place] as you can help [place] better understand your patient needs as well as improve your experience and satisfaction. WE CARE ABOUT YOU!!!   Continue to follow-up with urology pulmonology cardiology and oncology as planned Start Flonase and use 2 sprays each nostril at bedtime Also add Claritin over-the-counter 1 daily Use nasal saline frequently in each nostril If breathing gets worse get back in touch with [place] and we may need to call in a short course of prednisone Keep using inhalers and Mucinex  Arrie Senate MD

## 2017-08-03 LAB — CBC WITH DIFFERENTIAL/PLATELET
Basophils Absolute: 0 10*3/uL (ref 0.0–0.2)
Basos: 0 %
EOS (ABSOLUTE): 0 10*3/uL (ref 0.0–0.4)
Eos: 0 %
HEMOGLOBIN: 12.3 g/dL — AB (ref 13.0–17.7)
Hematocrit: 38.1 % (ref 37.5–51.0)
IMMATURE GRANS (ABS): 0 10*3/uL (ref 0.0–0.1)
IMMATURE GRANULOCYTES: 0 %
LYMPHS: 15 %
Lymphocytes Absolute: 1 10*3/uL (ref 0.7–3.1)
MCH: 30.4 pg (ref 26.6–33.0)
MCHC: 32.3 g/dL (ref 31.5–35.7)
MCV: 94 fL (ref 79–97)
MONOCYTES: 6 %
Monocytes Absolute: 0.4 10*3/uL (ref 0.1–0.9)
NEUTROS ABS: 4.9 10*3/uL (ref 1.4–7.0)
NEUTROS PCT: 79 %
Platelets: 152 10*3/uL (ref 150–379)
RBC: 4.05 x10E6/uL — ABNORMAL LOW (ref 4.14–5.80)
RDW: 14.5 % (ref 12.3–15.4)
WBC: 6.3 10*3/uL (ref 3.4–10.8)

## 2017-08-03 LAB — BMP8+EGFR
BUN/Creatinine Ratio: 16 (ref 10–24)
BUN: 13 mg/dL (ref 8–27)
CALCIUM: 9.2 mg/dL (ref 8.6–10.2)
CO2: 30 mmol/L — AB (ref 20–29)
CREATININE: 0.81 mg/dL (ref 0.76–1.27)
Chloride: 94 mmol/L — ABNORMAL LOW (ref 96–106)
GFR calc Af Amer: 107 mL/min/{1.73_m2} (ref >59)
GFR, EST NON AFRICAN AMERICAN: 93 mL/min/{1.73_m2} (ref >59)
Glucose: 278 mg/dL — ABNORMAL HIGH (ref 65–99)
Potassium: 3.9 mmol/L (ref 3.5–5.2)
Sodium: 139 mmol/L (ref 134–144)

## 2017-08-03 LAB — VITAMIN D 25 HYDROXY (VIT D DEFICIENCY, FRACTURES): VIT D 25 HYDROXY: 41 ng/mL (ref 30.0–100.0)

## 2017-08-03 LAB — LIPID PANEL
Chol/HDL Ratio: 3.8 ratio (ref 0.0–5.0)
Cholesterol, Total: 162 mg/dL (ref 100–199)
HDL: 43 mg/dL (ref >39)
LDL CALC: 85 mg/dL (ref 0–99)
Triglycerides: 169 mg/dL — ABNORMAL HIGH (ref 0–149)
VLDL CHOLESTEROL CAL: 34 mg/dL (ref 5–40)

## 2017-08-03 LAB — HEPATIC FUNCTION PANEL
ALK PHOS: 82 IU/L (ref 39–117)
ALT: 9 IU/L (ref 0–44)
AST: 8 IU/L (ref 0–40)
Albumin: 3.5 g/dL — ABNORMAL LOW (ref 3.6–4.8)
BILIRUBIN, DIRECT: 0.23 mg/dL (ref 0.00–0.40)
Bilirubin Total: 0.9 mg/dL (ref 0.0–1.2)
TOTAL PROTEIN: 6.1 g/dL (ref 6.0–8.5)

## 2017-08-09 ENCOUNTER — Other Ambulatory Visit: Payer: Self-pay | Admitting: Family Medicine

## 2017-08-09 DIAGNOSIS — J439 Emphysema, unspecified: Secondary | ICD-10-CM | POA: Diagnosis not present

## 2017-08-09 DIAGNOSIS — J449 Chronic obstructive pulmonary disease, unspecified: Secondary | ICD-10-CM | POA: Diagnosis not present

## 2017-08-09 DIAGNOSIS — J9611 Chronic respiratory failure with hypoxia: Secondary | ICD-10-CM | POA: Diagnosis not present

## 2017-08-15 ENCOUNTER — Other Ambulatory Visit: Payer: Medicare HMO

## 2017-08-15 DIAGNOSIS — Z1211 Encounter for screening for malignant neoplasm of colon: Secondary | ICD-10-CM | POA: Diagnosis not present

## 2017-08-16 LAB — FECAL OCCULT BLOOD, IMMUNOCHEMICAL: FECAL OCCULT BLD: POSITIVE — AB

## 2017-08-18 ENCOUNTER — Ambulatory Visit (INDEPENDENT_AMBULATORY_CARE_PROVIDER_SITE_OTHER): Payer: Medicare HMO | Admitting: Pharmacist Clinician (PhC)/ Clinical Pharmacy Specialist

## 2017-08-18 DIAGNOSIS — Z7901 Long term (current) use of anticoagulants: Secondary | ICD-10-CM | POA: Diagnosis not present

## 2017-08-18 DIAGNOSIS — I4891 Unspecified atrial fibrillation: Secondary | ICD-10-CM

## 2017-08-18 LAB — COAGUCHEK XS/INR WAIVED
INR: 1.8 — AB (ref 0.9–1.1)
Prothrombin Time: 22.1 s

## 2017-08-18 NOTE — Patient Instructions (Addendum)
  Description   Change to taking 1 1/2 tablets every day except for thursdays take 1 tablet  Your INR was 1.8 (goal 2-3) a little thick today

## 2017-08-21 ENCOUNTER — Ambulatory Visit: Payer: Medicare HMO | Admitting: Internal Medicine

## 2017-08-22 DIAGNOSIS — E1142 Type 2 diabetes mellitus with diabetic polyneuropathy: Secondary | ICD-10-CM | POA: Diagnosis not present

## 2017-08-22 DIAGNOSIS — L84 Corns and callosities: Secondary | ICD-10-CM | POA: Diagnosis not present

## 2017-08-22 DIAGNOSIS — M79676 Pain in unspecified toe(s): Secondary | ICD-10-CM | POA: Diagnosis not present

## 2017-08-22 DIAGNOSIS — B351 Tinea unguium: Secondary | ICD-10-CM | POA: Diagnosis not present

## 2017-08-25 ENCOUNTER — Encounter: Payer: Medicare HMO | Admitting: Pharmacist Clinician (PhC)/ Clinical Pharmacy Specialist

## 2017-09-08 DIAGNOSIS — J9611 Chronic respiratory failure with hypoxia: Secondary | ICD-10-CM | POA: Diagnosis not present

## 2017-09-08 DIAGNOSIS — J439 Emphysema, unspecified: Secondary | ICD-10-CM | POA: Diagnosis not present

## 2017-09-08 DIAGNOSIS — J449 Chronic obstructive pulmonary disease, unspecified: Secondary | ICD-10-CM | POA: Diagnosis not present

## 2017-09-14 ENCOUNTER — Other Ambulatory Visit: Payer: Medicare HMO

## 2017-09-14 DIAGNOSIS — R195 Other fecal abnormalities: Secondary | ICD-10-CM

## 2017-09-14 LAB — CBC WITH DIFFERENTIAL/PLATELET
BASOS ABS: 0 10*3/uL (ref 0.0–0.2)
Basos: 0 %
EOS (ABSOLUTE): 0.1 10*3/uL (ref 0.0–0.4)
Eos: 2 %
Hematocrit: 43.6 % (ref 37.5–51.0)
Hemoglobin: 14.1 g/dL (ref 13.0–17.7)
Immature Grans (Abs): 0 10*3/uL (ref 0.0–0.1)
Immature Granulocytes: 0 %
LYMPHS ABS: 1.9 10*3/uL (ref 0.7–3.1)
Lymphs: 28 %
MCH: 30.3 pg (ref 26.6–33.0)
MCHC: 32.3 g/dL (ref 31.5–35.7)
MCV: 94 fL (ref 79–97)
MONOCYTES: 6 %
MONOS ABS: 0.4 10*3/uL (ref 0.1–0.9)
Neutrophils Absolute: 4.4 10*3/uL (ref 1.4–7.0)
Neutrophils: 64 %
Platelets: 196 10*3/uL (ref 150–450)
RBC: 4.66 x10E6/uL (ref 4.14–5.80)
RDW: 14.1 % (ref 12.3–15.4)
WBC: 6.8 10*3/uL (ref 3.4–10.8)

## 2017-09-15 ENCOUNTER — Ambulatory Visit (INDEPENDENT_AMBULATORY_CARE_PROVIDER_SITE_OTHER): Payer: Medicare HMO | Admitting: Pharmacist Clinician (PhC)/ Clinical Pharmacy Specialist

## 2017-09-15 DIAGNOSIS — Z7901 Long term (current) use of anticoagulants: Secondary | ICD-10-CM

## 2017-09-15 DIAGNOSIS — I4891 Unspecified atrial fibrillation: Secondary | ICD-10-CM

## 2017-09-15 LAB — COAGUCHEK XS/INR WAIVED
INR: 1.3 — ABNORMAL HIGH (ref 0.9–1.1)
Prothrombin Time: 15.9 s

## 2017-09-15 LAB — FECAL OCCULT BLOOD, IMMUNOCHEMICAL: Fecal Occult Bld: NEGATIVE

## 2017-09-15 NOTE — Patient Instructions (Signed)
Description   Take two tablets of warfarin (10mg ) today and tomorrow then take 1 1/2 tablets every day of the week.  Your INR was 1.3 (goal 2-3) too thick today

## 2017-09-17 DIAGNOSIS — G4733 Obstructive sleep apnea (adult) (pediatric): Secondary | ICD-10-CM | POA: Diagnosis not present

## 2017-09-19 ENCOUNTER — Telehealth: Payer: Self-pay | Admitting: Family Medicine

## 2017-09-19 NOTE — Telephone Encounter (Signed)
Wife aware

## 2017-09-22 ENCOUNTER — Ambulatory Visit (INDEPENDENT_AMBULATORY_CARE_PROVIDER_SITE_OTHER): Payer: Medicare HMO | Admitting: Family Medicine

## 2017-09-22 DIAGNOSIS — Z7901 Long term (current) use of anticoagulants: Secondary | ICD-10-CM

## 2017-09-22 DIAGNOSIS — I4891 Unspecified atrial fibrillation: Secondary | ICD-10-CM | POA: Diagnosis not present

## 2017-09-22 LAB — COAGUCHEK XS/INR WAIVED
INR: 3.1 — AB (ref 0.9–1.1)
PROTHROMBIN TIME: 37.5 s

## 2017-09-22 NOTE — Patient Instructions (Signed)
Description   Pt already has taken today (5/31) dose. Hold SAT (6/1) Take 5 mg tab on SUN Then resume 7.5 mg all days until recheck on 09/29/17.  Your INR was 3.1

## 2017-09-22 NOTE — Progress Notes (Signed)
Description   Pt already has taken today (5/31) dose. Hold SAT (6/1) Take 5 mg tab on SUN Then resume 7.5 mg all days until recheck on 09/29/17.  Your INR was 3.1    Pt has a heart rate of 60/min Irregular and diminished breath sounds.

## 2017-09-24 IMAGING — DX DG LUMBAR SPINE 2-3V
2 series · 2 of 2 positions shown · non-contrast
Comparison: CT abdomen pelvis of 04/09/2015

CLINICAL DATA: Recent fall with low back pain, also history of
non-Hodgkin's lymphoma diagnosed in 7522

EXAM:
LUMBAR SPINE - 2-3 VIEW

[l-spine ap]
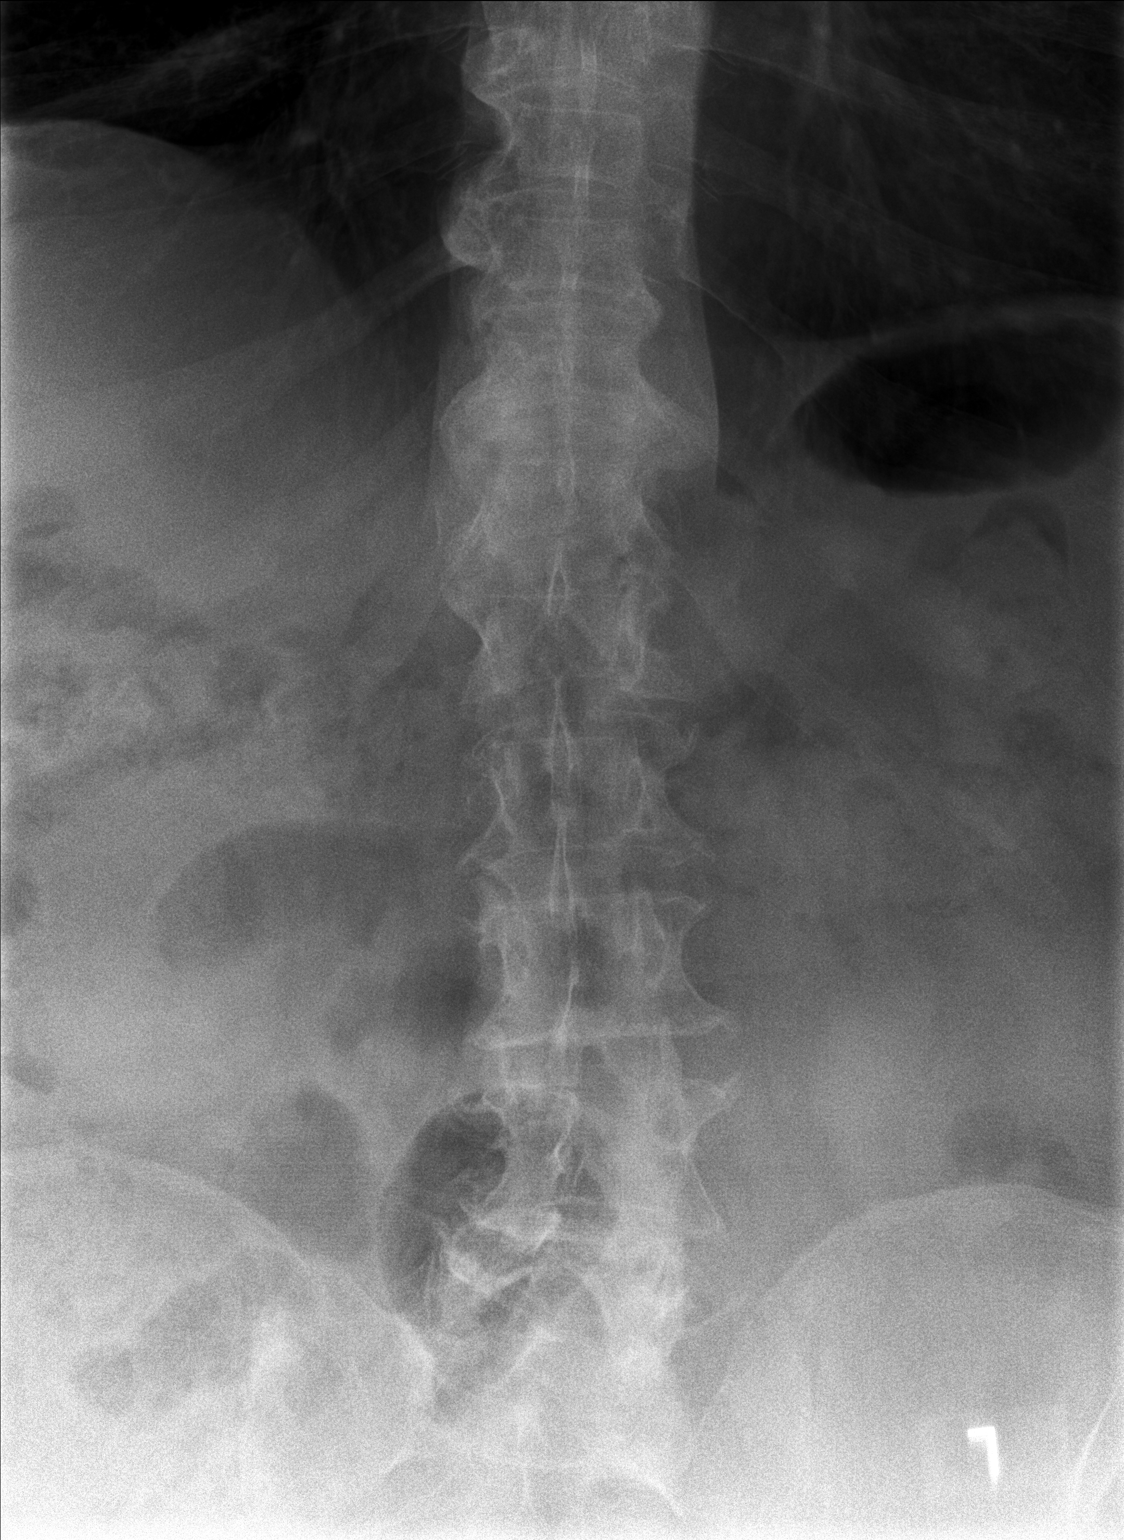

[l-spine lat]
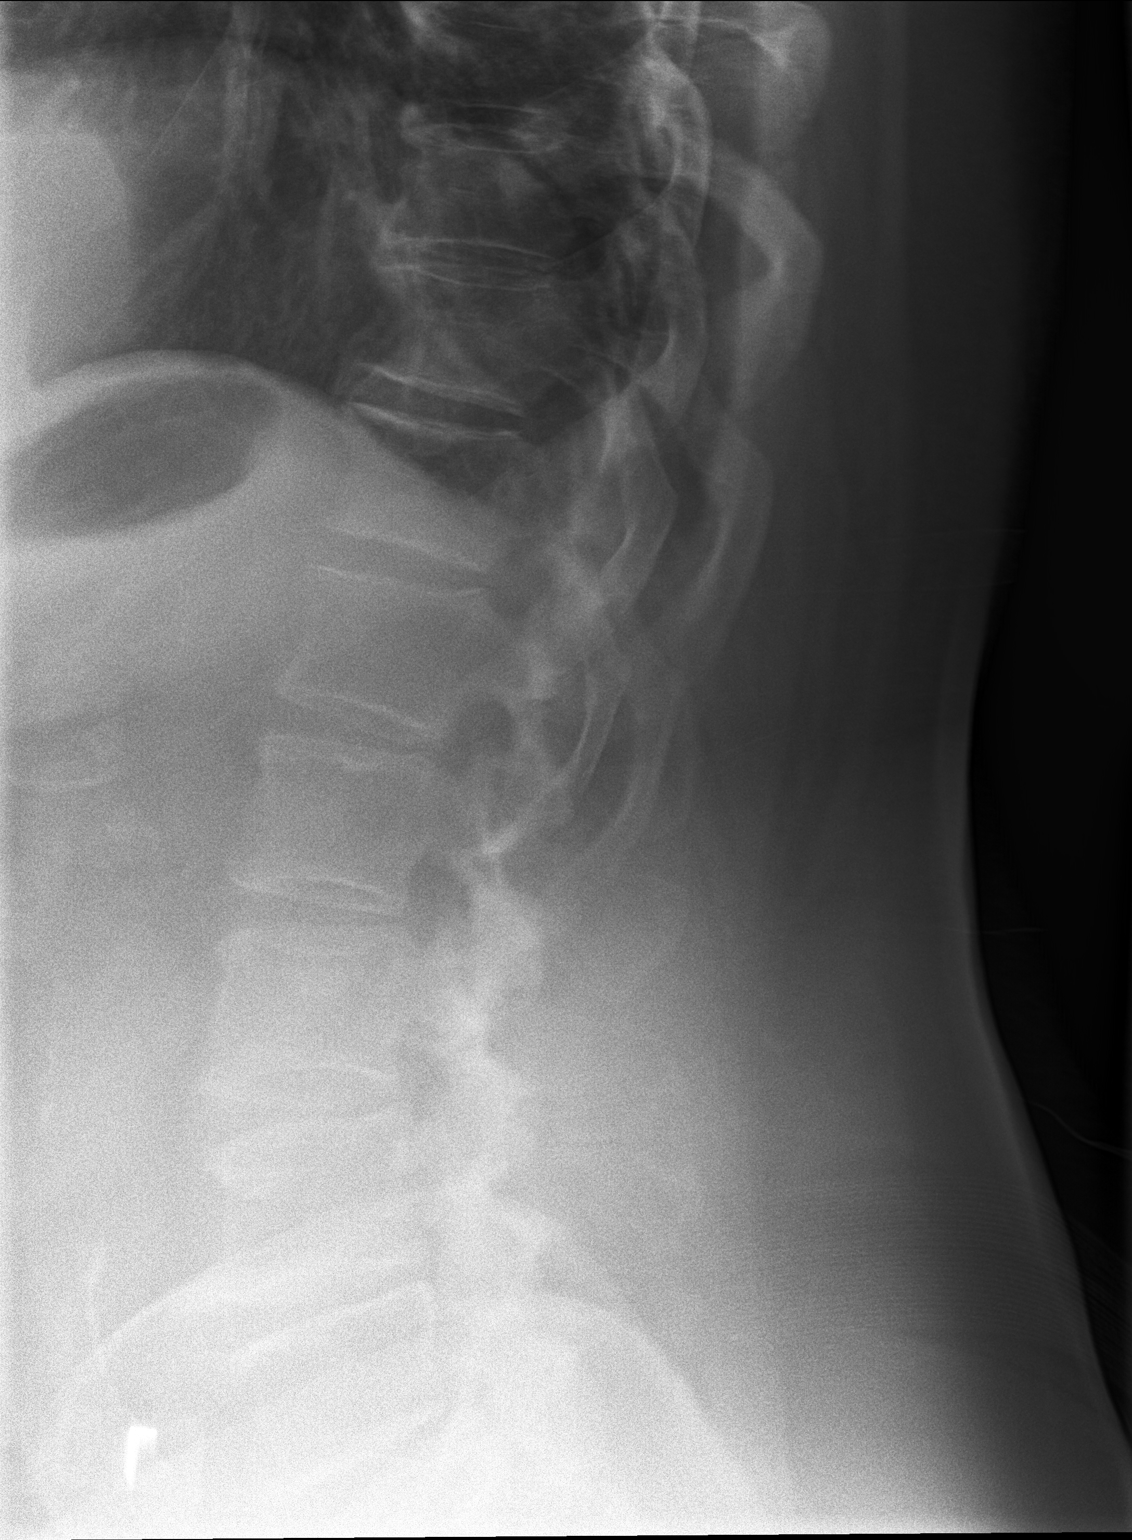

[2 of 2 positions shown; findings below may reference images not displayed]

FINDINGS: The lumbar vertebrae are in normal alignment. No compression
deformity is seen. Mild degenerative change is present at L3-4 and
L4-5 levels with some loss of disc space and anterior osteophyte
formation. The bowel gas pattern is nonspecific.
IMPRESSION: Normal alignment with mild degenerative change at L3-4 and L4-5. No
compression deformity.

## 2017-09-25 ENCOUNTER — Encounter: Payer: Self-pay | Admitting: Acute Care

## 2017-09-25 ENCOUNTER — Ambulatory Visit: Payer: Medicare HMO | Admitting: Acute Care

## 2017-09-25 ENCOUNTER — Telehealth: Payer: Self-pay | Admitting: Acute Care

## 2017-09-25 ENCOUNTER — Other Ambulatory Visit (INDEPENDENT_AMBULATORY_CARE_PROVIDER_SITE_OTHER): Payer: Medicare HMO

## 2017-09-25 ENCOUNTER — Ambulatory Visit (INDEPENDENT_AMBULATORY_CARE_PROVIDER_SITE_OTHER)
Admission: RE | Admit: 2017-09-25 | Discharge: 2017-09-25 | Disposition: A | Discharge status: Home / Self Care | Payer: Medicare HMO | Source: Ambulatory Visit | Attending: Acute Care | Admitting: Acute Care

## 2017-09-25 VITALS — BP 130/60 | HR 95 | Ht 71.0 in | Wt 266.0 lb

## 2017-09-25 DIAGNOSIS — J9611 Chronic respiratory failure with hypoxia: Secondary | ICD-10-CM

## 2017-09-25 DIAGNOSIS — G4733 Obstructive sleep apnea (adult) (pediatric): Secondary | ICD-10-CM

## 2017-09-25 DIAGNOSIS — F17210 Nicotine dependence, cigarettes, uncomplicated: Secondary | ICD-10-CM

## 2017-09-25 DIAGNOSIS — R0602 Shortness of breath: Secondary | ICD-10-CM | POA: Diagnosis not present

## 2017-09-25 DIAGNOSIS — J449 Chronic obstructive pulmonary disease, unspecified: Secondary | ICD-10-CM

## 2017-09-25 DIAGNOSIS — J9621 Acute and chronic respiratory failure with hypoxia: Secondary | ICD-10-CM | POA: Diagnosis not present

## 2017-09-25 DIAGNOSIS — R9431 Abnormal electrocardiogram [ECG] [EKG]: Secondary | ICD-10-CM

## 2017-09-25 DIAGNOSIS — R05 Cough: Secondary | ICD-10-CM | POA: Diagnosis not present

## 2017-09-25 LAB — CBC WITH DIFFERENTIAL/PLATELET
BASOS PCT: 0.3 % (ref 0.0–3.0)
Basophils Absolute: 0 10*3/uL (ref 0.0–0.1)
EOS ABS: 0.1 10*3/uL (ref 0.0–0.7)
Eosinophils Relative: 1.6 % (ref 0.0–5.0)
HEMATOCRIT: 42 % (ref 39.0–52.0)
HEMOGLOBIN: 14.1 g/dL (ref 13.0–17.0)
LYMPHS PCT: 22 % (ref 12.0–46.0)
Lymphs Abs: 1.5 10*3/uL (ref 0.7–4.0)
MCHC: 33.5 g/dL (ref 30.0–36.0)
MCV: 92 fl (ref 78.0–100.0)
MONOS PCT: 5.9 % (ref 3.0–12.0)
Monocytes Absolute: 0.4 10*3/uL (ref 0.1–1.0)
NEUTROS ABS: 4.9 10*3/uL (ref 1.4–7.7)
Neutrophils Relative %: 70.2 % (ref 43.0–77.0)
PLATELETS: 177 10*3/uL (ref 150.0–400.0)
RBC: 4.57 Mil/uL (ref 4.22–5.81)
RDW: 13.6 % (ref 11.5–15.5)
WBC: 6.9 10*3/uL (ref 4.0–10.5)

## 2017-09-25 LAB — BASIC METABOLIC PANEL
BUN: 21 mg/dL (ref 6–23)
CHLORIDE: 96 meq/L (ref 96–112)
CO2: 37 mEq/L — ABNORMAL HIGH (ref 19–32)
Calcium: 9.5 mg/dL (ref 8.4–10.5)
Creatinine, Ser: 0.99 mg/dL (ref 0.40–1.50)
GFR: 80.2 mL/min (ref >60.00)
GLUCOSE: 257 mg/dL — AB (ref 70–99)
POTASSIUM: 4.4 meq/L (ref 3.5–5.1)
Sodium: 139 mEq/L (ref 135–145)

## 2017-09-25 MED ORDER — FLUTICASONE-UMECLIDIN-VILANT 100-62.5-25 MCG/INH IN AEPB: 1.0000 | INHALATION_SPRAY | Freq: Every day | RESPIRATORY_TRACT | 0 refills | Status: DC

## 2017-09-25 MED ORDER — DOXYCYCLINE HYCLATE 100 MG PO TABS: 100.0000 mg | ORAL_TABLET | Freq: Two times a day (BID) | ORAL | 0 refills | Status: DC

## 2017-09-25 MED ORDER — LEVALBUTEROL HCL 0.63 MG/3ML IN NEBU
0.6300 mg | INHALATION_SOLUTION | Freq: Once | RESPIRATORY_TRACT | Status: AC
  Administered 2017-09-25: 0.63 mg via RESPIRATORY_TRACT

## 2017-09-25 MED ORDER — ALBUTEROL SULFATE HFA 108 (90 BASE) MCG/ACT IN AERS: INHALATION_SPRAY | RESPIRATORY_TRACT | 5 refills | Status: DC

## 2017-09-25 MED ORDER — PREDNISONE 10 MG PO TABS: ORAL_TABLET | ORAL | 0 refills | Status: DC

## 2017-09-25 NOTE — Assessment & Plan Note (Addendum)
Flare Non-compliant with oxygen Not using maintenance medications Plan: Doxycycline 100 mg BID x 7 days Prednisone taper; 10 mg tablets: 4 tabs x 2 days, 3 tabs x 2 days, 2 tabs x 2 days 1 tab x 2 days then stop. CXR today>> we will call you with results Breathing treatment now We will schedule an Echo Wear oxygen to maintain sats 88-92% We will walk you today>> you need 3 L [place] continuous oxygen with exertion . CBC and BMET today We will consider pulmonary rehab at follow up. We will schedule PFT Trelegy therapeutic trial 1 puff once daily We will prescribe a rescue inhaler 1 puff as needed up to 4 times a day for breakthrough shortness of breath Quit smoking  Do not smoke while using oxygen. You can cause an explosion, and you are at risk of facila and upper airway burns. Follow up with Dr. Earlie Server Will need to consider CT chest at follow up. Follow up in 3 weeks with Dr. Annamaria Boots or Judson Roch NP Please contact office for sooner follow up if symptoms do not improve or worsen or seek emergency care

## 2017-09-25 NOTE — Assessment & Plan Note (Addendum)
Non-compliant Plan:   Continue on CPAP at bedtime. You appear to be benefiting from the treatment Goal is to wear for at least 6 hours each night for maximal clinical benefit. Continue to work on weight loss, as the link between excess weight  and sleep apnea is well established.  Do not drive if sleepy. Remember to clean mask, tubing, filter, and reservoir once weekly with soapy water.  Follow up with Dr. Annamaria Boots or Judson Roch NP In 3 weeks  or before as needed.

## 2017-09-25 NOTE — Patient Instructions (Addendum)
Doxycycline 100 mg BID x 7 days Prednisone taper CXR today>> we will call you with results Breathing treatment now We will schedule an Echo Wear oxygen to maintain sats 88-92% We will walk you today>> you need 3 L [place] continuous oxygen with exertion . CBC and BMET today We will consider pulmonary rehab at follow up. We will schedule PFT Trelegy therapeutic trial 1 puff once daily We will prescribe a rescue inhaler 1 puff as needed up to 4 times a day for breakthrough shortness of breath Quit smoking  Do not smoke while using oxygen. You can cause an explosion, and you are at risk of facila and upper airway burns. Follow up with Dr. Earlie Server Follow up in 3 weeks with Dr. Annamaria Boots or Judson Roch NP Please contact office for sooner follow up if symptoms do not improve or worsen or seek emergency care

## 2017-09-25 NOTE — Progress Notes (Signed)
History of Present Illness Justin Ruiz is a 67 y.o. male current every day smoker with COPD/chronic hypoxic respiratory failure, OSA on CPAP, lung nodules, allergic rhinitis, complicated by history lymphoma, DM, A. fib/Coumadin, morbid obesity. He is followed by Dr. Annamaria Boots.    09/25/2017 Pt. Presents for follow up/ acute visit. Marland Kitchen He was last seen 04/2016 by Dr. Annamaria Boots. Has started smoking again, and has lost 33 pounds over the last 18 months. He is diabetic, and he states he has a smaller appetite.He is smoking about 1 pack of cigarettes  per day. He is wearing his oxygen at 3 L [place]. He is here because of worseinng dyspnea. He is using Duonebs only as needed. He states he is doing those every day about every 6-8 hours.He is not using any maintenance inhaler.He states he has a cough with minimal secretions, usually when he does cough something up, it is thick white. He is out of his rescue inhaler. He has not had PFT's in several years. Per his wife he is not complaint with his home oxygen. He often does not wear it and he often has oxygen saturations below 88%. We have reviewed the danger this represents to his heart He denies fever, chest pain, orthopnea or hemoptysis.   Test Results: CXR 09/25/2017>> reviewed personally by me IMPRESSION: Atelectatic change lateral right base. No edema or consolidation. Stable cardiac silhouette. There is aortic atherosclerosis.  Aortic Atherosclerosis (ICD10-    Echo 03/2016 - Normal LV wall thickness with mild chamber dilatation and LVEF   approximately 50%. Indeterminate diastolic function in the   setting of atrial fibrillation. Mild left atrial enlargement.   Calcified mitral annulus. Sclerotic aortic valve and mildly   ectatic aortic root. Mildly dilated right ventricle with   increased wall thickness and mildly reduced contraction. Trivial   tricuspid regurgitation with PASP estimated 46 mmHg.   CBC Latest Ref Rng & Units 09/25/2017 09/14/2017  08/02/2017  WBC 4.0 - 10.5 K/uL 6.9 6.8 6.3  Hemoglobin 13.0 - 17.0 g/dL 14.1 14.1 12.3(L)  Hematocrit 39.0 - 52.0 % 42.0 43.6 38.1  Platelets 150.0 - 400.0 K/uL 177.0 196 152    BMP Latest Ref Rng & Units 09/25/2017 08/02/2017 05/18/2017  Glucose 70 - 99 mg/dL 257(H) 278(H) 356(H)  BUN 6 - 23 mg/dL 21 13 31(H)  Creatinine 0.40 - 1.50 mg/dL 0.99 0.81 1.26  BUN/Creat Ratio 10 - 24 - 16 -  Sodium 135 - 145 mEq/L 139 139 136  Potassium 3.5 - 5.1 mEq/L 4.4 3.9 3.9  Chloride 96 - 112 mEq/L 96 94(L) 89(L)  CO2 19 - 32 mEq/L 37(H) 30(H) 37(H)  Calcium 8.4 - 10.5 mg/dL 9.5 9.2 8.3(L)    BNP    Component Value Date/Time   BNP 303.0 (H) 12/04/2016 1445    ProBNP    Component Value Date/Time   PROBNP 186.0 (H) 01/09/2012 1059    PFT    Component Value Date/Time   FEV1PRE 0.87 06/17/2014 1547   FEV1POST 1.02 06/17/2014 1547   FVCPRE 1.75 06/17/2014 1547   FVCPOST 2.03 06/17/2014 1547   TLC 5.85 06/17/2014 1547   DLCOUNC 19.57 06/17/2014 1547   PREFEV1FVCRT 50 06/17/2014 1547   PSTFEV1FVCRT 50 06/17/2014 1547    Dg Chest 2 View  Result Date: 09/25/2017 CLINICAL DATA:  Cough and shortness of breath EXAM: CHEST - 2 VIEW COMPARISON:  April 16, 2017 FINDINGS: There is atelectatic change in the lateral right base. Lungs elsewhere are clear. Heart size  and pulmonary vascularity normal. No adenopathy. There is aortic atherosclerosis. There is degenerative change in the thoracic spine. IMPRESSION: Atelectatic change lateral right base. No edema or consolidation. Stable cardiac silhouette. There is aortic atherosclerosis. Aortic Atherosclerosis (ICD10-I70.0). Electronically Signed   By: Lowella Grip III M.D.   On: 09/25/2017 11:55     Past medical hx Past Medical History:  Diagnosis Date  . Arthritis   . Asthma   . Atrial fibrillation ([place])    on AC  . BPH (benign prostatic hypertrophy)   . Colon polyps   . COPD (chronic obstructive pulmonary disease) ([place])    on 2.5-3L home  O2  . Diabetes mellitus   . GERD (gastroesophageal reflux disease)   . HTN (hypertension)    x 3 years  . Hyperlipidemia   . Lung nodules 03/11/2013  . Mediastinal adenopathy 06/20/2013   CT  & PET 2/15   . Memory loss   . 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 NHL (non-Hodgkin's lymphoma) ([place])    nhl dx 9/11  . Obesity    exogenous  . Shingles   . Sleep apnea    CPAP machine is broken     Social History   Tobacco Use  . Smoking status: Current Every Day Smoker    Packs/day: 1.00    Years: 40.00    Pack years: 40.00    Start date: 12/17/1968    Last attempt to quit: 11/14/2004    Years since quitting: 12.8  . Smokeless tobacco: Never Used  . Tobacco comment: 2 ppd   Substance Use Topics  . Alcohol use: No    Alcohol/week: 0.0 oz    Comment: previous  . Drug use: No    Mr.Cella reports that he has been smoking.  He started smoking about 48 years ago. He has a 40.00 pack-year smoking history. He has never used smokeless tobacco. He reports that he does not drink alcohol or use drugs.  Tobacco Cessation: Current every day smoker  Past surgical hx, Family hx, Social hx all reviewed.  Current Outpatient Medications on File Prior to Visit  Medication Sig  . ALPRAZolam (XANAX) 0.25 MG tablet TAKE 1 TABLET BY MOUTH THREE TIMES DAILY AS NEEDED FOR ANXIETY  . Blood Glucose Monitoring Suppl (CLEVER CHEK AUTO-CODE VOICE) DEVI USE AS DIRECTED  . Cholecalciferol (VITAMIN D3) 5000 UNITS TABS Take 1 tablet by mouth every morning.  . fenofibrate 160 MG tablet Take 1 tablet (160 mg total) by mouth daily.  . fluticasone (FLONASE) 50 MCG/ACT nasal spray Place 2 sprays into both nostrils daily.  . furosemide (LASIX) 40 MG tablet Take 1 tablet (40 mg total) by mouth 2 (two) times daily. As directed  . insulin regular (NOVOLIN R,HUMULIN R) 100 units/mL injection 6 units prior to each meal  . ipratropium-albuterol (DUONEB) 0.5-2.5 (3) MG/3ML SOLN PLACE 1 VIAL  (3 MLS) IN THE NEBULIZER EVERY 6 HOURS AS NEEDED  . Lancet Devices (LANCING DEVICE) MISC USE AS DIRECTED  . metFORMIN (GLUCOPHAGE) 500 MG tablet TAKE 1 TABLET BY MOUTH IN THE MORNING AND 2 IN THE EVENING (Patient taking differently: Take 500-1,000 mg by mouth 2 (two) times daily. TAKE 1 TABLET BY MOUTH IN THE MORNING AND 2 IN THE EVENING)  . metoprolol tartrate (LOPRESSOR) 100 MG tablet Take 1 tablet (100 mg total) by mouth 2 (two) times daily.  Marland Kitchen NOVOLIN N RELION 100 UNIT/ML injection INJECT 50 UNITS INTO THE SKIN DAILY BEFORE BREAKFAST  . PARoxetine (  PAXIL) 20 MG tablet Take 1 tablet (20 mg total) by mouth daily.  . simvastatin (ZOCOR) 40 MG tablet Take 1 tablet (40 mg total) by mouth daily.  Marland Kitchen warfarin (COUMADIN) 5 MG tablet Take 1 tablet (5 mg total) by mouth daily. Or as directed by anticoagulation clinic  . [DISCONTINUED] digoxin (LANOXIN) 0.25 MG tablet Take 250 mcg by mouth daily.    No current facility-administered medications on file prior to visit.      Allergies  Allergen Reactions  . Avapro [Irbesartan] Other (See Comments)    "doesnt sit right with me"--light headed  . Lipitor [Atorvastatin Calcium] Other (See Comments)    Leg pain    Review Of Systems:  Constitutional:   No  weight loss, night sweats,  Fevers, chills, +fatigue, or  lassitude.  HEENT:   No headaches,  Difficulty swallowing,  Tooth/dental problems, or  Sore throat,                No sneezing, itching, ear ache, nasal congestion, post nasal drip,   CV:  No chest pain,  Orthopnea, PND, swelling in lower extremities, anasarca, dizziness, palpitations, syncope.   GI  No heartburn, indigestion, abdominal pain, nausea, vomiting, diarrhea, change in bowel habits, loss of appetite, bloody stools.   Resp: + shortness of breath with exertion or at rest.  + excess mucus, no productive cough,  No non-productive cough,  No coughing up of blood.  No change in color of mucus.  No wheezing.  No chest wall  deformity  Skin: no rash or lesions.  GU: no dysuria, change in color of urine, no urgency or frequency.  No flank pain, no hematuria   MS:  No joint pain or swelling.  No decreased range of motion.  No back pain.  Psych:  No change in mood or affect. No depression or anxiety.  No memory loss.   Vital Signs BP 130/60 (BP Location: Right Arm, Cuff Size: Normal)   Pulse 95   Ht 5\' 11"  (1.803 m)   Wt 266 lb (120.7 kg)   SpO2 90%   BMI 37.10 kg/m    Physical Exam:  General- No distress,  A&Ox3, pleasant ENT: No sinus tenderness, TM clear, pale nasal mucosa, no oral exudate,no post nasal drip, no LAN Cardiac: S1, S2, regular rate and rhythm, no murmur Chest: No wheeze/ rales/ dullness; no accessory muscle use, no nasal flaring, no sternal retractions Abd.: Soft Non-tender Ext: No clubbing cyanosis, edema Neuro:  normal strength Skin: No rashes, warm and dry Psych: normal mood and behavior   Assessment/Plan  COPD mixed type (HCC) Flare Non-compliant with oxygen Not using maintenance medications Plan: Doxycycline 100 mg BID x 7 days Prednisone taper; 10 mg tablets: 4 tabs x 2 days, 3 tabs x 2 days, 2 tabs x 2 days 1 tab x 2 days then stop. CXR today>> we will call you with results Breathing treatment now We will schedule an Echo Wear oxygen to maintain sats 88-92% We will walk you today>> you need 3 L [place] continuous oxygen with exertion . CBC and BMET today We will consider pulmonary rehab at follow up. We will schedule PFT Trelegy therapeutic trial 1 puff once daily We will prescribe a rescue inhaler 1 puff as needed up to 4 times a day for breakthrough shortness of breath Quit smoking  Do not smoke while using oxygen. You can cause an explosion, and you are at risk of facila and upper airway burns. Follow up with  Dr. Earlie Server Will need to consider CT chest at follow up. Follow up in 3 weeks with Dr. Annamaria Boots or Judson Roch NP Please contact office for sooner follow up if  symptoms do not improve or worsen or seek emergency care    Obstructive sleep apnea Non-compliant Plan:   Continue on CPAP at bedtime. You appear to be benefiting from the treatment Goal is to wear for at least 6 hours each night for maximal clinical benefit. Continue to work on weight loss, as the link between excess weight  and sleep apnea is well established.  Do not drive if sleepy. Remember to clean mask, tubing, filter, and reservoir once weekly with soapy water.  Follow up with Dr. Annamaria Boots or Judson Roch NP In 3 weeks  or before as needed.   Acute on chronic respiratory failure with hypoxia (HCC) Non-compliant with oxygen use Plan: Wear oxygen to maintain sats 88-92% We will walk you today>> you need 3 L [place] continuous oxygen with exertion . Do not smoke while using oxygen. You can cause an explosion, and you are at risk of facila and upper airway burns. Follow up in 3 weeks with Dr. Annamaria Boots or Judson Roch NP Please contact office for sooner follow up if symptoms do not improve or worsen or seek emergency care      Magdalen Spatz, NP 09/25/2017  5:35 PM

## 2017-09-25 NOTE — Telephone Encounter (Signed)
Called pt's wife and advised message from the provider. Pt's wife understood and verbalized understanding. Nothing further is needed.   Rx;s sent in to his pharmacy.

## 2017-09-25 NOTE — Telephone Encounter (Signed)
Please call in Doxycycline 100 mg twice daily and Prednisone taper; 10 mg tablets: 4 tabs x 2 days, 3 tabs x 2 days, 2 tabs x 2 days 1 tab x 2 days then stop. Follow up as scheduled . Thanks

## 2017-09-25 NOTE — Assessment & Plan Note (Signed)
Non-compliant with oxygen use Plan: Wear oxygen to maintain sats 88-92% We will walk you today>> you need 3 L [place] continuous oxygen with exertion . Do not smoke while using oxygen. You can cause an explosion, and you are at risk of facila and upper airway burns. Follow up in 3 weeks with Dr. Annamaria Boots or Judson Roch NP Please contact office for sooner follow up if symptoms do not improve or worsen or seek emergency care

## 2017-09-27 ENCOUNTER — Ambulatory Visit (INDEPENDENT_AMBULATORY_CARE_PROVIDER_SITE_OTHER): Payer: Medicare HMO | Admitting: Pharmacist Clinician (PhC)/ Clinical Pharmacy Specialist

## 2017-09-27 DIAGNOSIS — I4891 Unspecified atrial fibrillation: Secondary | ICD-10-CM

## 2017-09-27 DIAGNOSIS — Z7901 Long term (current) use of anticoagulants: Secondary | ICD-10-CM

## 2017-09-27 LAB — COAGUCHEK XS/INR WAIVED
INR: 3 — AB (ref 0.9–1.1)
PROTHROMBIN TIME: 36.5 s

## 2017-09-27 NOTE — Patient Instructions (Addendum)
  Description   Take 5mg  a day and eat 1-2 times a week eat a vegetable that is high in vitamin K.  Patient started Doxycycline and Prednisone on 6/4 will be taking for 10 days.  INR today is 3.0  (goal is 2-3)

## 2017-09-29 ENCOUNTER — Other Ambulatory Visit (HOSPITAL_COMMUNITY): Payer: Medicare HMO

## 2017-10-04 ENCOUNTER — Encounter: Payer: Self-pay | Admitting: *Deleted

## 2017-10-06 ENCOUNTER — Ambulatory Visit (INDEPENDENT_AMBULATORY_CARE_PROVIDER_SITE_OTHER): Payer: Medicare HMO | Admitting: Pharmacist Clinician (PhC)/ Clinical Pharmacy Specialist

## 2017-10-06 DIAGNOSIS — Z7901 Long term (current) use of anticoagulants: Secondary | ICD-10-CM | POA: Diagnosis not present

## 2017-10-06 DIAGNOSIS — I4891 Unspecified atrial fibrillation: Secondary | ICD-10-CM

## 2017-10-06 LAB — COAGUCHEK XS/INR WAIVED
INR: 4.7 — ABNORMAL HIGH (ref 0.9–1.1)
PROTHROMBIN TIME: 55.9 s

## 2017-10-06 NOTE — Patient Instructions (Signed)
Description   No coumadin Saturday, Sunday, and Monday.  Have a high vitamin K vegetable both days.  INR today is 4.7  (goal is 2-3)  Too thin today

## 2017-10-09 ENCOUNTER — Other Ambulatory Visit: Payer: Self-pay | Admitting: *Deleted

## 2017-10-09 DIAGNOSIS — J449 Chronic obstructive pulmonary disease, unspecified: Secondary | ICD-10-CM | POA: Diagnosis not present

## 2017-10-09 DIAGNOSIS — J9611 Chronic respiratory failure with hypoxia: Secondary | ICD-10-CM | POA: Diagnosis not present

## 2017-10-09 DIAGNOSIS — J439 Emphysema, unspecified: Secondary | ICD-10-CM | POA: Diagnosis not present

## 2017-10-09 MED ORDER — INSULIN NPH (HUMAN) (ISOPHANE) 100 UNIT/ML ~~LOC~~ SUSP: 50.0000 [IU] | Freq: Every day | SUBCUTANEOUS | 0 refills | Status: DC

## 2017-10-09 MED ORDER — SIMVASTATIN 40 MG PO TABS: 40.0000 mg | ORAL_TABLET | Freq: Every day | ORAL | 1 refills | Status: DC

## 2017-10-09 MED ORDER — PAROXETINE HCL 20 MG PO TABS: 20.0000 mg | ORAL_TABLET | Freq: Every day | ORAL | 0 refills | Status: DC

## 2017-10-09 MED ORDER — METOPROLOL TARTRATE 100 MG PO TABS: 100.0000 mg | ORAL_TABLET | Freq: Two times a day (BID) | ORAL | 1 refills | Status: DC

## 2017-10-09 MED ORDER — WARFARIN SODIUM 5 MG PO TABS: 5.0000 mg | ORAL_TABLET | Freq: Every day | ORAL | 3 refills | Status: DC

## 2017-10-09 MED ORDER — INSULIN REGULAR HUMAN 100 UNIT/ML IJ SOLN: 8.0000 [IU] | Freq: Two times a day (BID) | INTRAMUSCULAR | 0 refills | Status: DC

## 2017-10-09 MED ORDER — FUROSEMIDE 40 MG PO TABS: 40.0000 mg | ORAL_TABLET | Freq: Two times a day (BID) | ORAL | 1 refills | Status: DC

## 2017-10-09 MED ORDER — METFORMIN HCL 500 MG PO TABS: ORAL_TABLET | ORAL | 3 refills | Status: DC

## 2017-10-13 ENCOUNTER — Ambulatory Visit (INDEPENDENT_AMBULATORY_CARE_PROVIDER_SITE_OTHER): Payer: Medicare HMO | Admitting: Pharmacist Clinician (PhC)/ Clinical Pharmacy Specialist

## 2017-10-13 DIAGNOSIS — Z7901 Long term (current) use of anticoagulants: Secondary | ICD-10-CM

## 2017-10-13 DIAGNOSIS — I4891 Unspecified atrial fibrillation: Secondary | ICD-10-CM

## 2017-10-13 LAB — COAGUCHEK XS/INR WAIVED
INR: 3.5 — ABNORMAL HIGH (ref 0.9–1.1)
Prothrombin Time: 41.8 s

## 2017-10-13 NOTE — Patient Instructions (Signed)
Description   No warfarin tomorrow then resume regular schedule.  INR today is 3.5 (goal 2-3).  A little thin today

## 2017-11-03 ENCOUNTER — Encounter: Payer: Self-pay | Admitting: Pharmacist Clinician (PhC)/ Clinical Pharmacy Specialist

## 2017-11-08 DIAGNOSIS — J449 Chronic obstructive pulmonary disease, unspecified: Secondary | ICD-10-CM | POA: Diagnosis not present

## 2017-11-08 DIAGNOSIS — J439 Emphysema, unspecified: Secondary | ICD-10-CM | POA: Diagnosis not present

## 2017-11-08 DIAGNOSIS — J9611 Chronic respiratory failure with hypoxia: Secondary | ICD-10-CM | POA: Diagnosis not present

## 2017-11-10 ENCOUNTER — Encounter: Payer: Self-pay | Admitting: Pharmacist Clinician (PhC)/ Clinical Pharmacy Specialist

## 2017-11-10 ENCOUNTER — Other Ambulatory Visit: Payer: Self-pay

## 2017-11-10 ENCOUNTER — Encounter: Payer: Self-pay | Admitting: Internal Medicine

## 2017-11-10 ENCOUNTER — Ambulatory Visit (INDEPENDENT_AMBULATORY_CARE_PROVIDER_SITE_OTHER): Payer: Medicare HMO | Admitting: Internal Medicine

## 2017-11-10 DIAGNOSIS — J9611 Chronic respiratory failure with hypoxia: Secondary | ICD-10-CM

## 2017-11-10 DIAGNOSIS — J441 Chronic obstructive pulmonary disease with (acute) exacerbation: Secondary | ICD-10-CM | POA: Diagnosis not present

## 2017-11-10 DIAGNOSIS — J449 Chronic obstructive pulmonary disease, unspecified: Secondary | ICD-10-CM | POA: Diagnosis not present

## 2017-11-10 DIAGNOSIS — G4733 Obstructive sleep apnea (adult) (pediatric): Secondary | ICD-10-CM

## 2017-11-10 HISTORY — DX: Chronic respiratory failure with hypoxia: J96.11

## 2017-11-10 LAB — PULMONARY FUNCTION TEST
DL/VA % PRED: 49 %
DL/VA: 2.3 ml/min/mmHg/L
DLCO COR: 9.84 ml/min/mmHg
DLCO UNC % PRED: 29 %
DLCO UNC: 9.7 ml/min/mmHg
DLCO cor % pred: 29 %
FEF 25-75 PRE: 0.23 L/s
FEF 25-75 Post: 0.51 L/sec
FEF2575-%CHANGE-POST: 123 %
FEF2575-%PRED-POST: 19 %
FEF2575-%PRED-PRE: 8 %
FEV1-%Change-Post: 39 %
FEV1-%PRED-POST: 22 %
FEV1-%Pred-Pre: 16 %
FEV1-PRE: 0.56 L
FEV1-Post: 0.79 L
FEV1FVC-%CHANGE-POST: 0 %
FEV1FVC-%PRED-PRE: 64 %
FEV6-%CHANGE-POST: 39 %
FEV6-%Pred-Post: 36 %
FEV6-%Pred-Pre: 26 %
FEV6-Post: 1.6 L
FEV6-Pre: 1.15 L
FEV6FVC-%CHANGE-POST: 0 %
FEV6FVC-%Pred-Post: 102 %
FEV6FVC-%Pred-Pre: 103 %
FVC-%Change-Post: 39 %
FVC-%Pred-Post: 35 %
FVC-%Pred-Pre: 25 %
FVC-Post: 1.65 L
FVC-Pre: 1.18 L
POST FEV1/FVC RATIO: 48 %
PRE FEV1/FVC RATIO: 48 %
Post FEV6/FVC ratio: 97 %
Pre FEV6/FVC Ratio: 98 %
RV % pred: 252 %
RV: 6.08 L
TLC % pred: 104 %
TLC: 7.43 L

## 2017-11-10 MED ORDER — THEOPHYLLINE ER 200 MG PO TB12: ORAL_TABLET | ORAL | 12 refills | Status: DC

## 2017-11-10 NOTE — Progress Notes (Signed)
PFT done today. 

## 2017-11-10 NOTE — Assessment & Plan Note (Signed)
Far advanced COPD.  There is a reactive component without obvious infection at this visit.  We will try adding theophylline and I gave him permission to use his nebulizer machine every 6 hours if needed. Plan-Theodur 200 mg twice daily with discussion

## 2017-11-10 NOTE — Progress Notes (Signed)
HPI male former smoker followed for COPD/chronic hypoxic respiratory failure, OSA, lung nodules, allergic rhinitis, complicated by history lymphoma, DM, A. fib/Coumadin, morbid obesity NPSG 04/07/14-  AHI 75.1/ hr, desaturation to 79% ( O2 was added at 2L), CPAP to  16, Body weight 338 lbs PFT 06/17/14- severe obstructive airways disease with slight response to bronchodilator. FVC 2.03/42%, FEV1 1.02/28%, ratio 0.5, TLC 82%, DLCO 59% PFT 11/10/2017-very severe obstructive airways disease with response to bronchodilator, air trapping, diffusion severely reduced.  FVC 1.65/35%, FEV1 0.79/22%, ratio 0.48, TLC 104%, DLCO 29% -----------------------------------------------------------------  08/09/2016- 67 year old male former smoker followed for COPD/chronic hypoxic respiratory failure, OSA, allergic rhinitis, lung nodules, complicated by history lymphoma, DM, A. fib/Coumadin, morbid obesity CPAP auto 12-20/[facility]  O2 3 L continuous FOLLOWS FOR: DME: [facility]. Pt continues O2 24/7; has not used CPAP properly in past month due to mask fitting. Needs help with getting a better fitted mask. DL attached.  Says CPAP mask irritates his face and the airflow is drying him out too much. We reviewed comfort management and communication with his DME company.  11/10/2017- 67 year old male smoker followed for COPD/chronic hypoxic respiratory failure, OSA, allergic rhinitis, lung nodules, complicated by history lymphoma, DM, A. fib/Coumadin, morbid obesity CPAP auto 12-20/[facility]  O2 3 L continuous -----He says breathing has been worse he is more sob. He says he wears CPAP every night but mask is not fitting well. Pro-air HFA, DuoNeb, Trelegy, He says wears CPAP all night every night.  Dislikes the straps. Download records NO use.  Says Trelegy helps.  Nebulizer helps, used once or twice daily with benefit lasting a few hours.  Little cough.  Breathing is "always bad" without acute change. PFT 11/10/2017-very severe  obstructive airways disease with response to bronchodilator, air trapping, diffusion severely reduced.  FVC 1.65/35%, FEV1 0.79/22%, ratio 0.48, TLC 104%, DLCO 29% CXR 09/25/2017 IMPRESSION: Atelectatic change lateral right base. No edema or consolidation. Stable cardiac silhouette. There is aortic atherosclerosis. Aortic Atherosclerosis (ICD10-I70.0).  ROS-see HPI      + = positive Constitutional:   No-   weight loss, night sweats, fevers, chills, +fatigue, lassitude. HEENT:   No-  headaches, difficulty swallowing, tooth/dental problems, sore throat,       No-  sneezing, itching, ear ache, nasal congestion, post nasal drip,  CV:  No-   chest pain, orthopnea, PND, swelling in lower extremities, anasarca,                                                             dizziness, palpitations Resp: + shortness of breath with exertion or at rest.              No-   productive cough,  No non-productive cough,  No- coughing up of blood.              No-   change in color of mucus.   Skin: No-   rash or lesions. GI:  No-   heartburn, indigestion, abdominal pain, nausea, vomiting,  GU:  MS:  No-   joint pain or swelling.  . Neuro-     nothing unusual Psych:  No- change in mood or affect. No depression or anxiety.  No memory loss.  OBJ- Physical Exam     O2 3 L, saturation 96%  at rest [place], Oriented, Affect-appropriate, Distress- none acute, +obese Skin- rash-none, lesions- none, excoriation- none Lymphadenopathy- none Head- atraumatic            Eyes- Gross vision intact, PERRLA, conjunctivae and secretions clear,             Ears- Hearing, canals-normal            Nose- Clear, no-Septal dev, mucus, polyps, erosion, perforation             Throat- Mallampati II-III , mucosa clear , drainage- none, tonsils- atrophic Neck- flexible , trachea midline, no stridor , thyroid nl, carotid no bruit Chest - symmetrical excursion , unlabored           Heart/CV- IRR +, no murmur , no gallop  , no  rub, nl s1 s2                           - JVD- none , edema- none, stasis changes+, varices- none           Lung- decreased airflow/clear, unlabored, wheeze+ , dullness-none, rub- none           Chest wall-  Abd-  Br/ Gen/ Rectal- Not done, not indicated Extrem- cyanosis- none, clubbing, none, atrophy- none, strength- nl Neuro- grossly intact to observation

## 2017-11-10 NOTE — Patient Instructions (Addendum)
Order- DME Justin Ruiz   Please refit CPAP mask of choice  You can use your nebulizer machine every 6 hours if you need to.  Script for theophylline pills sent. See if these help your breathing  Ok to use Afrin nasal spray for stuffy nose, but not more than 1 puff in each nostril once daily.  Please call as needed

## 2017-11-10 NOTE — Assessment & Plan Note (Signed)
He remains oxygen dependent, increased to 3 L at last prior OV.

## 2017-11-10 NOTE — Assessment & Plan Note (Addendum)
I did not get the download until after he had left, recording 0 usage.  He says he uses his CPAP every night so 1 and may be a recording problem. Plan-importance of CPAP use with O2 was discussed.  Continue CPAP auto 12-20.              DME is asked to refit mask.

## 2017-11-14 ENCOUNTER — Encounter: Payer: Self-pay | Admitting: Internal Medicine

## 2017-11-14 ENCOUNTER — Inpatient Hospital Stay: Payer: Medicare HMO | Attending: Internal Medicine | Admitting: Internal Medicine

## 2017-11-14 ENCOUNTER — Inpatient Hospital Stay: Payer: Medicare HMO

## 2017-11-14 VITALS — BP 115/68 | HR 81 | Temp 97.5°F | Resp 19 | Ht 70.5 in | Wt 289.4 lb

## 2017-11-14 DIAGNOSIS — E785 Hyperlipidemia, unspecified: Secondary | ICD-10-CM | POA: Diagnosis not present

## 2017-11-14 DIAGNOSIS — Z794 Long term (current) use of insulin: Secondary | ICD-10-CM | POA: Diagnosis not present

## 2017-11-14 DIAGNOSIS — I10 Essential (primary) hypertension: Secondary | ICD-10-CM | POA: Insufficient documentation

## 2017-11-14 DIAGNOSIS — Z8572 Personal history of non-Hodgkin lymphomas: Secondary | ICD-10-CM | POA: Diagnosis not present

## 2017-11-14 DIAGNOSIS — G4730 Sleep apnea, unspecified: Secondary | ICD-10-CM | POA: Diagnosis not present

## 2017-11-14 DIAGNOSIS — Z9981 Dependence on supplemental oxygen: Secondary | ICD-10-CM | POA: Insufficient documentation

## 2017-11-14 DIAGNOSIS — E1140 Type 2 diabetes mellitus with diabetic neuropathy, unspecified: Secondary | ICD-10-CM

## 2017-11-14 DIAGNOSIS — K219 Gastro-esophageal reflux disease without esophagitis: Secondary | ICD-10-CM | POA: Insufficient documentation

## 2017-11-14 DIAGNOSIS — E669 Obesity, unspecified: Secondary | ICD-10-CM | POA: Diagnosis not present

## 2017-11-14 DIAGNOSIS — E1165 Type 2 diabetes mellitus with hyperglycemia: Secondary | ICD-10-CM

## 2017-11-14 DIAGNOSIS — I4891 Unspecified atrial fibrillation: Secondary | ICD-10-CM | POA: Diagnosis not present

## 2017-11-14 DIAGNOSIS — C8332 Diffuse large B-cell lymphoma, intrathoracic lymph nodes: Secondary | ICD-10-CM

## 2017-11-14 DIAGNOSIS — Z86010 Personal history of colonic polyps: Secondary | ICD-10-CM | POA: Diagnosis not present

## 2017-11-14 DIAGNOSIS — Z7901 Long term (current) use of anticoagulants: Secondary | ICD-10-CM | POA: Insufficient documentation

## 2017-11-14 DIAGNOSIS — J9611 Chronic respiratory failure with hypoxia: Secondary | ICD-10-CM

## 2017-11-14 DIAGNOSIS — J449 Chronic obstructive pulmonary disease, unspecified: Secondary | ICD-10-CM | POA: Insufficient documentation

## 2017-11-14 DIAGNOSIS — Z79899 Other long term (current) drug therapy: Secondary | ICD-10-CM | POA: Diagnosis not present

## 2017-11-14 LAB — CBC WITH DIFFERENTIAL (CANCER CENTER ONLY)
Basophils Absolute: 0 10*3/uL (ref 0.0–0.1)
Basophils Relative: 0 %
EOS PCT: 1 %
Eosinophils Absolute: 0.1 10*3/uL (ref 0.0–0.5)
HCT: 45.3 % (ref 38.4–49.9)
Hemoglobin: 15 g/dL (ref 13.0–17.1)
LYMPHS ABS: 1.1 10*3/uL (ref 0.9–3.3)
LYMPHS PCT: 15 %
MCH: 31 pg (ref 27.2–33.4)
MCHC: 33.1 g/dL (ref 32.0–36.0)
MCV: 93.6 fL (ref 79.3–98.0)
MONO ABS: 0.5 10*3/uL (ref 0.1–0.9)
Monocytes Relative: 6 %
Neutro Abs: 5.4 10*3/uL (ref 1.5–6.5)
Neutrophils Relative %: 78 %
PLATELETS: 162 10*3/uL (ref 140–400)
RBC: 4.84 MIL/uL (ref 4.20–5.82)
RDW: 13.5 % (ref 11.0–14.6)
WBC Count: 7 10*3/uL (ref 4.0–10.3)

## 2017-11-14 LAB — CMP (CANCER CENTER ONLY)
ALBUMIN: 3.6 g/dL (ref 3.5–5.0)
ALT: 12 U/L (ref 0–44)
AST: 13 U/L — AB (ref 15–41)
Alkaline Phosphatase: 83 U/L (ref 38–126)
Anion gap: 13 (ref 5–15)
BUN: 15 mg/dL (ref 8–23)
CHLORIDE: 92 mmol/L — AB (ref 98–111)
CO2: 37 mmol/L — ABNORMAL HIGH (ref 22–32)
Calcium: 9.5 mg/dL (ref 8.9–10.3)
Creatinine: 1.11 mg/dL (ref 0.61–1.24)
GFR, Est AFR Am: >60 mL/min (ref >60)
GFR, Estimated: >60 mL/min (ref >60)
GLUCOSE: 279 mg/dL — AB (ref 70–99)
POTASSIUM: 3.6 mmol/L (ref 3.5–5.1)
Sodium: 142 mmol/L (ref 135–145)
Total Bilirubin: 1.2 mg/dL (ref 0.3–1.2)
Total Protein: 7.5 g/dL (ref 6.5–8.1)

## 2017-11-14 LAB — LACTATE DEHYDROGENASE: LDH: 203 U/L — AB (ref 98–192)

## 2017-11-14 NOTE — Progress Notes (Signed)
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 2) hypermetabolic mediastinal lymphadenopathy. 3) monoclonal gammopathy of undetermined significance  PRIOR THERAPY: Status post systemic chemotherapy with 6 cycles of infusional CHOPE with Rituxan.  CURRENT THERAPY: Observation  INTERVAL HISTORY: Justin Ruiz 67 y.o. male returns to the clinic today for six-month follow-up visit.  The patient is feeling fine today with no specific complaints except for the baseline shortness of breath and he is currently on home oxygen.  He denied having any chest pain.  He denied having any recent weight loss or night sweats.  He has no nausea, vomiting, diarrhea or constipation.  He denied having any fever or chills.  He is here today for evaluation and repeat blood work.   MEDICAL HISTORY: Past Medical History:  Diagnosis Date  . Arthritis   . Asthma   . Atrial fibrillation ([place])    on AC  . BPH (benign prostatic hypertrophy)   . Colon polyps   . COPD (chronic obstructive pulmonary disease) ([place])    on 2.5-3L home O2  . Diabetes mellitus   . GERD (gastroesophageal reflux disease)   . HTN (hypertension)    x 3 years  . Hyperlipidemia   . Lung nodules 03/11/2013  . Mediastinal adenopathy 06/20/2013   CT  & PET 2/15   . Memory loss   . 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 NHL (non-Hodgkin's lymphoma) ([place])    nhl dx 9/11  . Obesity    exogenous  . Shingles   . Sleep apnea    CPAP machine is broken    ALLERGIES:  is allergic to avapro [irbesartan] and lipitor [atorvastatin calcium].  MEDICATIONS:  Current Outpatient Medications  Medication Sig Dispense Refill  . albuterol (PROAIR HFA) 108 (90 Base) MCG/ACT inhaler Inhale 2 puffs every 4-6 hours  -rescue inhaler 8.5 g 5  . ALPRAZolam (XANAX) 0.25 MG tablet TAKE 1 TABLET BY MOUTH THREE TIMES DAILY AS NEEDED FOR ANXIETY 90 tablet 1  . Blood Glucose Monitoring Suppl (CLEVER CHEK AUTO-CODE VOICE) DEVI USE AS DIRECTED 1 each 2  . Cholecalciferol (VITAMIN D3) 5000 UNITS TABS Take 1 tablet by mouth every morning.    . fenofibrate 160 MG tablet Take 1 tablet (160 mg total) by mouth daily. 90 tablet 1  . fluticasone (FLONASE) 50 MCG/ACT nasal spray Place 2 sprays into both nostrils daily. 16 g 6  . Fluticasone-Umeclidin-Vilant (TRELEGY ELLIPTA) 100-62.5-25 MCG/INH AEPB Inhale 1 puff into the lungs daily. 60 each 0  . furosemide (LASIX) 40 MG tablet Take 1 tablet (40 mg total) by mouth 2 (two) times daily. As directed 180 tablet 1  . insulin NPH Human (NOVOLIN N RELION) 100 UNIT/ML injection Inject 0.5 mLs (50 Units total) into the skin daily before breakfast. 50 mL 0  . insulin regular (NOVOLIN R,HUMULIN R) 100 units/mL injection Inject 0.08-0.09 mLs (8-9 Units total) into the skin 2 (two) times daily before a meal. 20 mL 0  . ipratropium-albuterol (DUONEB) 0.5-2.5 (3) MG/3ML SOLN PLACE 1 VIAL (3 MLS) IN THE NEBULIZER EVERY 6 HOURS AS NEEDED 1080 mL 0  . Lancet Devices (LANCING DEVICE) MISC USE AS DIRECTED 1 each 2  . metFORMIN (GLUCOPHAGE) 500 MG tablet TAKE 1 TABLET BY MOUTH IN THE MORNING AND 2  IN THE EVENING 270 tablet 3  . metoprolol tartrate (LOPRESSOR) 100 MG tablet Take 1 tablet (100 mg total) by mouth 2 (two) times daily. 180 tablet 1  . PARoxetine (PAXIL) 20 MG tablet Take 1 tablet (20 mg total) by mouth daily. 90 tablet 0  . simvastatin (ZOCOR) 40 MG tablet Take 1 tablet (40 mg total) by mouth daily. 90 tablet 1  . theophylline (THEODUR) 200 MG 12 hr tablet 1 twice daily with food 60 tablet 12  . warfarin (COUMADIN) 5 MG tablet Take 1 tablet (5 mg total) by mouth daily. Or as directed by anticoagulation clinic 90 tablet 3   No current facility-administered medications for this visit.      SURGICAL HISTORY:  Past Surgical History:  Procedure Laterality Date  . APPENDECTOMY    . COLONOSCOPY WITH PROPOFOL N/A 05/05/2014   Procedure: COLONOSCOPY WITH PROPOFOL;  Surgeon: Inda Castle, MD;  Location: [facility];  Service: Endoscopy;  Laterality: N/A;    REVIEW OF SYSTEMS:  A comprehensive review of systems was negative except for: Constitutional: positive for fatigue Respiratory: positive for dyspnea on exertion   PHYSICAL EXAMINATION: General appearance: alert, cooperative and no distress Head: Normocephalic, without obvious abnormality, atraumatic Neck: no adenopathy, no JVD, supple, symmetrical, trachea midline and thyroid not enlarged, symmetric, no tenderness/mass/nodules Lymph nodes: Cervical, supraclavicular, and axillary nodes normal. Resp: clear to auscultation bilaterally Back: symmetric, no curvature. ROM normal. No CVA tenderness. Cardio: regular rate and rhythm, S1, S2 normal, no murmur, click, rub or gallop GI: soft, non-tender; bowel sounds normal; no masses,  no organomegaly Extremities: extremities normal, atraumatic, no cyanosis or edema  ECOG PERFORMANCE STATUS: 1 - Symptomatic but completely ambulatory  Blood pressure 115/68, pulse 81, temperature (!) 97.5 F (36.4 C), temperature source Oral, resp. rate 19, height 5' 10.5" (1.791 m), weight 289 lb 6.4 oz (131.3 kg), SpO2 95 %.  LABORATORY DATA: Lab Results  Component Value Date   WBC 7.0 11/14/2017   HGB 15.0 11/14/2017   HCT 45.3 11/14/2017   MCV 93.6 11/14/2017   PLT 162 11/14/2017      Chemistry      Component Value Date/Time   NA 139 09/25/2017 1125   NA 139 08/02/2017 0930   NA 139 05/19/2016 0933   K 4.4 09/25/2017 1125   K 4.0 05/19/2016 0933   CL 96 09/25/2017 1125   CL 100 09/03/2012 0751   CO2 37 (H) 09/25/2017 1125   CO2 32 (H) 05/19/2016 0933   BUN 21 09/25/2017 1125   BUN 13 08/02/2017 0930   BUN 25.3 05/19/2016 0933   CREATININE 0.99 09/25/2017 1125    CREATININE 1.2 05/19/2016 0933   GLU 185 03/29/2012      Component Value Date/Time   CALCIUM 9.5 09/25/2017 1125   CALCIUM 10.4 05/19/2016 0933   ALKPHOS 82 08/02/2017 0930   ALKPHOS 90 05/19/2016 0933   AST 8 08/02/2017 0930   AST 19 05/19/2016 0933   ALT 9 08/02/2017 0930   ALT 14 05/19/2016 0933   BILITOT 0.9 08/02/2017 0930   BILITOT 1.21 (H) 05/19/2016 0933       RADIOGRAPHIC STUDIES: No results found.  ASSESSMENT AND PLAN:   this is a very pleasant 67 years old white male with history of high-grade B-cell non-Hodgkin lymphoma , suspicious for Burkitt's lymphoma but was negative cytogenetics for 8;14 abnormality. The patient is status post 6 cycles of systemic chemotherapy with CHOP/Rituxan completed in 2012. The patient has been in observation  since that time. He is feeling fine with no concerning complaints except for his baseline shortness of breath secondary to COPD. Lab work today is unremarkable except for hyperglycemia secondary to his diabetes mellitus. I recommended for the patient to continue on observation with repeat CBC, complaints metabolic panel and LDH in 6 months. For the hyperglycemia and COPD, the patient will continue his follow-up visit by his primary care physician and pulmonologist. He was advised to call immediately if he has any concerning symptoms in the interval. The patient voices understanding of current disease status and treatment options and is in agreement with the current care plan. All questions were answered. The patient knows to call the clinic with any problems, questions or concerns. We can certainly see the patient much sooner if necessary. I spent 10 minutes counseling the patient face to face. The total time spent in the appointment was 15 minutes.   Disclaimer: This note was dictated with voice recognition software. Similar sounding words can inadvertently be transcribed and may not be corrected upon review.

## 2017-11-15 LAB — IGG, IGA, IGM
IGA: 248 mg/dL (ref 61–437)
IGG (IMMUNOGLOBIN G), SERUM: 973 mg/dL (ref 700–1600)
IgM (Immunoglobulin M), Srm: 168 mg/dL (ref 20–172)

## 2017-11-15 LAB — KAPPA/LAMBDA LIGHT CHAINS
KAPPA, LAMDA LIGHT CHAIN RATIO: 1.58 (ref 0.26–1.65)
Kappa free light chain: 37.1 mg/L — ABNORMAL HIGH (ref 3.3–19.4)
Lambda free light chains: 23.5 mg/L (ref 5.7–26.3)

## 2017-11-15 LAB — BETA 2 MICROGLOBULIN, SERUM: Beta-2 Microglobulin: 2.3 mg/L (ref 0.6–2.4)

## 2017-11-17 ENCOUNTER — Ambulatory Visit (INDEPENDENT_AMBULATORY_CARE_PROVIDER_SITE_OTHER): Payer: Medicare HMO | Admitting: Pharmacist Clinician (PhC)/ Clinical Pharmacy Specialist

## 2017-11-17 DIAGNOSIS — I4891 Unspecified atrial fibrillation: Secondary | ICD-10-CM | POA: Diagnosis not present

## 2017-11-17 DIAGNOSIS — Z7901 Long term (current) use of anticoagulants: Secondary | ICD-10-CM

## 2017-11-17 LAB — COAGUCHEK XS/INR WAIVED
INR: 6.2 (ref 0.9–1.1)
Prothrombin Time: 74.3 s

## 2017-11-17 LAB — HEMOGLOBIN, FINGERSTICK: Hemoglobin: 14 g/dL (ref 12.6–17.7)

## 2017-11-17 NOTE — Patient Instructions (Addendum)
Description   Gave 2.5mg  of vitamin K by mouth in office today.  No warfarin until re-checked on Monday.  Take precautions against falls and head injuries.  Call the office if you have any bleeding or other concerns related to your INR being too thin.  Dr. Lajuana Ripple will check your INR on Monday.  INR today is 5.9  (goal 2-3).  Level is very thin today

## 2017-11-20 ENCOUNTER — Ambulatory Visit (INDEPENDENT_AMBULATORY_CARE_PROVIDER_SITE_OTHER): Payer: Medicare HMO | Admitting: Family Medicine

## 2017-11-20 DIAGNOSIS — I4891 Unspecified atrial fibrillation: Secondary | ICD-10-CM

## 2017-11-20 DIAGNOSIS — Z7901 Long term (current) use of anticoagulants: Secondary | ICD-10-CM

## 2017-11-20 LAB — PROTIME-INR
INR: 6.4 (ref 0.8–1.2)
PROTHROMBIN TIME: 59.6 s — AB (ref 9.1–12.0)

## 2017-11-20 LAB — COAGUCHEK XS/INR WAIVED
INR: 1.6 — ABNORMAL HIGH (ref 0.9–1.1)
Prothrombin Time: 19.3 s

## 2017-11-20 NOTE — Progress Notes (Signed)
Subjective: CC: Chronic anticoagulation PCP: Chipper Herb, MD [id number] Justin Ruiz is a 67 y.o. male presenting to clinic today for:  1. Chronic anticoagulation Patient was seen by Sharyn Lull, pharmacist, on Friday and noted to have an INR of 6.2.  He was told to hold his medications over the weekend and have repeat INR today.  Today's INR was subtherapeutic at 1.6.  Patient denies any chest pain, shortness of breath, lower extremity edema, redness or pain.   ROS: Per HPI  Allergies  Allergen Reactions  . Avapro [Irbesartan] Other (See Comments)    "doesnt sit right with me"--light headed  . Lipitor [Atorvastatin Calcium] Other (See Comments)    Leg pain   Past Medical History:  Diagnosis Date  . Arthritis   . Asthma   . Atrial fibrillation ([place])    on AC  . BPH (benign prostatic hypertrophy)   . Colon polyps   . COPD (chronic obstructive pulmonary disease) ([place])    on 2.5-3L home O2  . Diabetes mellitus   . GERD (gastroesophageal reflux disease)   . HTN (hypertension)    x 3 years  . Hyperlipidemia   . Lung nodules 03/11/2013  . Mediastinal adenopathy 06/20/2013   CT  & PET 2/15   . Memory loss   . 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 NHL (non-Hodgkin's lymphoma) ([place])    nhl dx 9/11  . Obesity    exogenous  . Shingles   . Sleep apnea    CPAP machine is broken    Current Outpatient Medications:  .  albuterol (PROAIR HFA) 108 (90 Base) MCG/ACT inhaler, Inhale 2 puffs every 4-6 hours -rescue inhaler, Disp: 8.5 g, Rfl: 5 .  ALPRAZolam (XANAX) 0.25 MG tablet, TAKE 1 TABLET BY MOUTH THREE TIMES DAILY AS NEEDED FOR ANXIETY, Disp: 90 tablet, Rfl: 1 .  Blood Glucose Monitoring Suppl (CLEVER CHEK AUTO-CODE VOICE) DEVI, USE AS DIRECTED, Disp: 1 each, Rfl: 2 .  Cholecalciferol (VITAMIN D3) 5000 UNITS TABS, Take 1 tablet by mouth every morning., Disp: , Rfl:  .  fenofibrate 160 MG tablet, Take 1 tablet (160 mg total) by mouth daily., Disp: 90  tablet, Rfl: 1 .  fluticasone (FLONASE) 50 MCG/ACT nasal spray, Place 2 sprays into both nostrils daily., Disp: 16 g, Rfl: 6 .  Fluticasone-Umeclidin-Vilant (TRELEGY ELLIPTA) 100-62.5-25 MCG/INH AEPB, Inhale 1 puff into the lungs daily., Disp: 60 each, Rfl: 0 .  furosemide (LASIX) 40 MG tablet, Take 1 tablet (40 mg total) by mouth 2 (two) times daily. As directed, Disp: 180 tablet, Rfl: 1 .  insulin NPH Human (NOVOLIN N RELION) 100 UNIT/ML injection, Inject 0.5 mLs (50 Units total) into the skin daily before breakfast., Disp: 50 mL, Rfl: 0 .  insulin regular (NOVOLIN R,HUMULIN R) 100 units/mL injection, Inject 0.08-0.09 mLs (8-9 Units total) into the skin 2 (two) times daily before a meal., Disp: 20 mL, Rfl: 0 .  ipratropium-albuterol (DUONEB) 0.5-2.5 (3) MG/3ML SOLN, PLACE 1 VIAL (3 MLS) IN THE NEBULIZER EVERY 6 HOURS AS NEEDED, Disp: 1080 mL, Rfl: 0 .  Lancet Devices (LANCING DEVICE) MISC, USE AS DIRECTED, Disp: 1 each, Rfl: 2 .  metFORMIN (GLUCOPHAGE) 500 MG tablet, TAKE 1 TABLET BY MOUTH IN THE MORNING AND 2 IN THE EVENING, Disp: 270 tablet, Rfl: 3 .  metoprolol tartrate (LOPRESSOR) 100 MG tablet, Take 1 tablet (100 mg total) by mouth 2 (two) times daily., Disp: 180 tablet, Rfl: 1 .  PARoxetine (PAXIL) 20 MG  tablet, Take 1 tablet (20 mg total) by mouth daily., Disp: 90 tablet, Rfl: 0 .  simvastatin (ZOCOR) 40 MG tablet, Take 1 tablet (40 mg total) by mouth daily., Disp: 90 tablet, Rfl: 1 .  theophylline (THEODUR) 200 MG 12 hr tablet, 1 twice daily with food, Disp: 60 tablet, Rfl: 12 .  warfarin (COUMADIN) 5 MG tablet, Take 1 tablet (5 mg total) by mouth daily. Or as directed by anticoagulation clinic, Disp: 90 tablet, Rfl: 3 Social History   Socioeconomic History  . Marital status: Married    Spouse name: Not on file  . Number of children: 0  . Years of education: 12  . Highest education level: Not on file  Occupational History  . Occupation: Disabled  Social Needs  . Financial resource  strain: Not on file  . Food insecurity:    Worry: Not on file    Inability: Not on file  . Transportation needs:    Medical: Not on file    Non-medical: Not on file  Tobacco Use  . Smoking status: Former Smoker    Packs/day: 1.00    Years: 40.00    Pack years: 40.00    Types: Cigarettes    Start date: 12/17/1968    Last attempt to quit: 11/14/2004    Years since quitting: 13.0  . Smokeless tobacco: Never Used  . Tobacco comment: 2 ppd   Substance and Sexual Activity  . Alcohol use: No    Alcohol/week: 0.0 oz    Comment: previous  . Drug use: No  . Sexual activity: Yes  Lifestyle  . Physical activity:    Days per week: Not on file    Minutes per session: Not on file  . Stress: Not on file  Relationships  . Social connections:    Talks on phone: Not on file    Gets together: Not on file    Attends religious service: Not on file    Active member of club or organization: Not on file    Attends meetings of clubs or organizations: Not on file    Relationship status: Not on file  . Intimate partner violence:    Fear of current or ex partner: Not on file    Emotionally abused: Not on file    Physically abused: Not on file    Forced sexual activity: Not on file  Other Topics Concern  . Not on file  Social History Narrative   Married, disabled Dealer (COPD). Daily caffeine use - 2 cup/day.   Right-handed.   Family History  Problem Relation Age of Onset  . Liver cancer Mother   . Diabetes Mother   . Heart disease Mother   . Colon cancer Father   . Prostate cancer Father   . Colon polyps Father   . Pulmonary embolism Sister   . Diabetes Sister   . Heart attack Sister   . Aortic aneurysm Sister   . COPD Sister   . Heart disease Sister   . COPD Sister   . Heart disease Sister   . Diabetes Maternal Grandmother     Objective: Office vital signs reviewed. BP 133/82   Pulse 90   Temp (!) 97.2 F (36.2 C) (Oral)   Ht 5\' 10"  (1.778 m)   Wt 291 lb (132 kg)   BMI  41.75 kg/m   Physical Examination:  General: Awake, alert, obese, chronically ill appearing, No acute distress Cardio: irregularly irregular w/ controlled rate.  Pulm: globally decreased breath  sounds w/ expiratory wheeze noted. Normal work of breathing on room air.  Assessment/ Plan: 67 y.o. male   1. Atrial fibrillation with RVR (HCC) INR subtherapeutic at 1.6 today.  I recommended that he resume his previous dosing of 7.5 mg of Coumadin daily except for Thursday when he will take 5 mg daily.  He will follow-up with Sharyn Lull on Friday for repeat INR.  No refills on Coumadin needed today. - CoaguChek XS/INR Waived  2. Chronic anticoagulation - CoaguChek XS/INR Waived  Ashly Windell Moulding, DO [facility] [phone]

## 2017-11-20 NOTE — Patient Instructions (Signed)
Start taking the Coumadin again today.  You will take 7.5 mg every day except for Thursday, you will take 5 mg on Thursdays.  Follow-up with Sharyn Lull on Friday for repeat Coumadin check.

## 2017-11-22 ENCOUNTER — Telehealth: Payer: Self-pay | Admitting: Internal Medicine

## 2017-11-22 MED ORDER — THEOPHYLLINE ER 400 MG PO CP24: 400.0000 mg | ORAL_CAPSULE | Freq: Every day | ORAL | 6 refills | Status: DC

## 2017-11-22 NOTE — Telephone Encounter (Signed)
Called and spoke with [facility] in [place].  Theophylline 200mg  12hour tabs, has been d/c.  They are recommending Theo ER, which is a 24 hour extended release caps, they can take once per day. Theo ER is in stock and Patient could pick it up this afternoon.  Dr. Annamaria Boots, please advise

## 2017-11-22 NOTE — Telephone Encounter (Signed)
Rx has been sent in per VS. Nothing further was needed.

## 2017-11-22 NOTE — Telephone Encounter (Signed)
Ok to use Theo XR 400 mg, 1 daily # 30, ref x 6

## 2017-12-09 DIAGNOSIS — J439 Emphysema, unspecified: Secondary | ICD-10-CM | POA: Diagnosis not present

## 2017-12-09 DIAGNOSIS — J9611 Chronic respiratory failure with hypoxia: Secondary | ICD-10-CM | POA: Diagnosis not present

## 2017-12-09 DIAGNOSIS — J449 Chronic obstructive pulmonary disease, unspecified: Secondary | ICD-10-CM | POA: Diagnosis not present

## 2017-12-13 ENCOUNTER — Telehealth: Payer: Self-pay | Admitting: Family Medicine

## 2017-12-13 NOTE — Telephone Encounter (Signed)
Patient already has an appt on Friday. Was contacted earlier

## 2017-12-15 ENCOUNTER — Ambulatory Visit: Payer: Medicare HMO | Admitting: Pharmacist Clinician (PhC)/ Clinical Pharmacy Specialist

## 2017-12-15 DIAGNOSIS — I4891 Unspecified atrial fibrillation: Secondary | ICD-10-CM | POA: Diagnosis not present

## 2017-12-15 DIAGNOSIS — Z7901 Long term (current) use of anticoagulants: Secondary | ICD-10-CM | POA: Diagnosis not present

## 2017-12-15 LAB — COAGUCHEK XS/INR WAIVED
INR: 2.5 — AB (ref 0.9–1.1)
Prothrombin Time: 29.5 s

## 2017-12-15 NOTE — Patient Instructions (Signed)
Description   Continue taking warfarin the same way as on calendar.  7.5mg  every day except for 5mg  on Thursday.  INR today is 2.5  Perfect reading goal is 2-3

## 2017-12-19 ENCOUNTER — Encounter: Payer: Self-pay | Admitting: Cardiology

## 2017-12-19 NOTE — Progress Notes (Signed)
HPI Patient presents for routine followup atrial fib.  With a history of COPD and  acute on chronic heart failure with an EF on echo of 50%.   He returns for one year follow up.  Since I last saw him he has done well. The patient denies any new symptoms such as chest discomfort, neck or arm discomfort. There has been no new shortness of breath, PND or orthopnea. There have been no reported palpitations, presyncope or syncope.  He has O2 that he wears most of the time.  The patient denies any new symptoms such as chest discomfort, neck or arm discomfort. There has been no new shortness of breath, PND or orthopnea. There have been no reported palpitations, presyncope or syncope.   His heart rate is up but he says he did not take his meds because he has not eaten yet although it is 2 pm.    Allergies  Allergen Reactions  . Avapro [Irbesartan] Other (See Comments)    "doesnt sit right with me"--light headed  . Lipitor [Atorvastatin Calcium] Other (See Comments)    Leg pain    Current Outpatient Medications  Medication Sig Dispense Refill  . albuterol (PROAIR HFA) 108 (90 Base) MCG/ACT inhaler Inhale 2 puffs every 4-6 hours -rescue inhaler 8.5 g 5  . ALPRAZolam (XANAX) 0.25 MG tablet TAKE 1 TABLET BY MOUTH THREE TIMES DAILY AS NEEDED FOR ANXIETY 90 tablet 1  . Cholecalciferol (VITAMIN D3) 5000 UNITS TABS Take 1 tablet by mouth every morning.    . digoxin (LANOXIN) 0.25 MG tablet Take 0.25 mg by mouth daily.    . fenofibrate 160 MG tablet Take 1 tablet (160 mg total) by mouth daily. 90 tablet 1  . fluticasone (FLONASE) 50 MCG/ACT nasal spray Place 2 sprays into both nostrils daily. 16 g 6  . Fluticasone-Umeclidin-Vilant (TRELEGY ELLIPTA) 100-62.5-25 MCG/INH AEPB Inhale 1 puff into the lungs daily. 60 each 0  . furosemide (LASIX) 40 MG tablet Take 1 tablet (40 mg total) by mouth 2 (two) times daily. As directed 180 tablet 1  . insulin NPH Human (NOVOLIN N RELION) 100 UNIT/ML injection Inject 0.5  mLs (50 Units total) into the skin daily before breakfast. 50 mL 0  . insulin regular (NOVOLIN R,HUMULIN R) 100 units/mL injection Inject 0.08-0.09 mLs (8-9 Units total) into the skin 2 (two) times daily before a meal. 20 mL 0  . ipratropium-albuterol (DUONEB) 0.5-2.5 (3) MG/3ML SOLN PLACE 1 VIAL (3 MLS) IN THE NEBULIZER EVERY 6 HOURS AS NEEDED 1080 mL 0  . metFORMIN (GLUCOPHAGE) 500 MG tablet TAKE 1 TABLET BY MOUTH IN THE MORNING AND 2 IN THE EVENING 270 tablet 3  . metoprolol tartrate (LOPRESSOR) 100 MG tablet Take 1 tablet (100 mg total) by mouth 2 (two) times daily. 180 tablet 1  . PARoxetine (PAXIL) 20 MG tablet Take 1 tablet (20 mg total) by mouth daily. 90 tablet 0  . simvastatin (ZOCOR) 40 MG tablet Take 1 tablet (40 mg total) by mouth daily. 90 tablet 1  . theophylline (THEO-24) 400 MG 24 hr capsule Take 1 capsule (400 mg total) by mouth daily. 30 capsule 6  . warfarin (COUMADIN) 5 MG tablet Take 1 tablet (5 mg total) by mouth daily. Or as directed by anticoagulation clinic 90 tablet 3  . Blood Glucose Monitoring Suppl (CLEVER CHEK AUTO-CODE VOICE) DEVI USE AS DIRECTED 1 each 2  . Lancet Devices (LANCING DEVICE) MISC USE AS DIRECTED 1 each 2   No current  facility-administered medications for this visit.     Past Medical History:  Diagnosis Date  . Acute diastolic heart failure ([place]) 03/28/2016  . Acute encephalopathy 03/28/2016  . Acute on chronic respiratory failure with hypoxia ([place]) 12/04/2016  . Arthritis   . Asthma   . Atrial fibrillation ([place])    on AC  . Benign neoplasm of ascending colon 05/05/2014  . BPH (benign prostatic hypertrophy)   . Chronic anticoagulation 08/20/2012  . Chronic respiratory failure with hypoxia ([place]) 11/10/2017  . Colon polyps   . COPD mixed type ([place]) 03/28/2016   PFT 06/17/14- severe obstructive airways disease with slight response to bronchodilator. FVC 2.03/42%, FEV1 1.02/28%, ratio 0.5, TLC 82%, DLCO 59%  . Diabetes mellitus   . Diabetic  neuropathy, type II diabetes mellitus ([place]) 09/25/2014  . Diabetic retinopathy ([place]) 05/28/2017  . 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 GERD (gastroesophageal reflux disease)   . HTN (hypertension)    x 3 years  . Hyperlipidemia   . Lung nodules 03/11/2013  . Mediastinal adenopathy 06/20/2013   CT  & PET 2/15   . Memory loss   . 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 NHL (non-Hodgkin's lymphoma) ([place])    nhl dx 9/11  . Obesity    exogenous  . Obstructive sleep apnea 05/25/2014   NPSG-  04/07/2014-severe obstructive sleep apnea-AHI 75.1 per hour. CPAP titrated to 16. He required supplemental oxygen at 2 L/ [employer] which he already wears for sleep at home. Weight 338 pounds   . Peripheral vascular insufficiency ([place]) 08/02/2017  . Shingles   . Sleep apnea    CPAP machine is broken  . Thrombocytopenia ( [place]) 11/23/2016    Past Surgical History:  Procedure Laterality Date  . APPENDECTOMY    . COLONOSCOPY WITH PROPOFOL N/A 05/05/2014   Procedure: COLONOSCOPY WITH PROPOFOL;  Surgeon: Inda Castle, MD;  Location: [facility];  Service: Endoscopy;  Laterality: N/A;    ROS: As stated in the HPI and negative for all other systems.  PHYSICAL EXAM BP 134/78   Pulse (!) 120   Ht 5' 10.5" (1.791 m)   Wt 293 lb (132.9 kg)   BMI 41.45 kg/m   GENERAL:  Well appearing NECK:  No jugular venous distention, waveform within normal limits, carotid upstroke brisk and symmetric, no bruits, no thyromegaly LUNGS:  Clear to auscultation bilaterally CHEST:  Unremarkable HEART:  PMI not displaced or sustained,S1 and S2 within normal limits, no S3,  no clicks, no rubs, no murmurs, irregular ABD:  Flat, positive bowel sounds normal in frequency in pitch, no bruits, no rebound, no guarding, no midline pulsatile mass, no hepatomegaly, no splenomegaly EXT:  2 plus pulses throughout, no edema, no cyanosis no clubbing     EKG:  Atrial  fibrillation rate 120, axis within normal limits, intervals within normal limits, diffuse T wave flattening. Premature ectopic beats  12/20/2017   Lab Results  Component Value Date   CHOL 162 08/02/2017   TRIG 169 (H) 08/02/2017   HDL 43 08/02/2017   LDLCALC 85 08/02/2017    ASSESSMENT AND PLAN  FIBRILLATION, ATRIAL -   Mr. ECHO ALLSBROOK has a CHA2DS2 - VASc score of 2 with a risk of stroke of 2.2%.  His rate is elevated but he says he did not take his medication.  I am going to follow-up with a Holter monitor to make sure he has reasonable rate control and he is instructed  to take his medications on a regular time every day and to eat more regularly.  CHRONIC DIASTOLIC HF -  He seems to be euvolemic.  No change in therapy.  HTN -  The blood pressure is controlled.  Continue current meds.   DM -   His is A1c was 7.9.  This is followed by Chipper Herb, MD.  He understands the importance of good sugar control and again we talked about diet.   MORBID OBESITY - The patient understands the need to lose weight with diet and exercise. We have discussed specific strategies for this.  We talked about weight loss and I instructed him on not eating bread or potatoes.

## 2017-12-20 ENCOUNTER — Encounter: Payer: Self-pay | Admitting: Cardiology

## 2017-12-20 ENCOUNTER — Ambulatory Visit: Payer: Medicare HMO | Admitting: Cardiology

## 2017-12-20 VITALS — BP 134/78 | HR 120 | Ht 70.5 in | Wt 293.0 lb

## 2017-12-20 DIAGNOSIS — I482 Chronic atrial fibrillation: Secondary | ICD-10-CM | POA: Diagnosis not present

## 2017-12-20 DIAGNOSIS — Z794 Long term (current) use of insulin: Secondary | ICD-10-CM

## 2017-12-20 DIAGNOSIS — I4821 Permanent atrial fibrillation: Secondary | ICD-10-CM

## 2017-12-20 DIAGNOSIS — I10 Essential (primary) hypertension: Secondary | ICD-10-CM | POA: Diagnosis not present

## 2017-12-20 DIAGNOSIS — E118 Type 2 diabetes mellitus with unspecified complications: Secondary | ICD-10-CM | POA: Diagnosis not present

## 2017-12-20 DIAGNOSIS — I5032 Chronic diastolic (congestive) heart failure: Secondary | ICD-10-CM | POA: Diagnosis not present

## 2017-12-20 NOTE — Patient Instructions (Signed)
Medication Instructions:  The current medical regimen is effective;  continue present plan and medications.  Testing/Procedures: Your physician has recommended that you wear a holter monitor for 24 hours. Holter monitors are medical devices that record the heart's electrical activity. Doctors most often use these monitors to diagnose arrhythmias. Arrhythmias are problems with the speed or rhythm of the heartbeat. The monitor is a small, portable device. You can wear one while you do your normal daily activities. This is usually used to diagnose what is causing palpitations/syncope (passing out).  Follow-Up: Follow up in 1 year with Dr. Percival Spanish.  You will receive a letter in the mail 2 months before you are due.  Please call us when you receive this letter to schedule your follow up appointment.  If you need a refill on your cardiac medications before your next appointment, please call your pharmacy.  Thank you for choosing [facility]!!

## 2018-01-03 ENCOUNTER — Ambulatory Visit (INDEPENDENT_AMBULATORY_CARE_PROVIDER_SITE_OTHER): Payer: Medicare HMO | Admitting: Pharmacist Clinician (PhC)/ Clinical Pharmacy Specialist

## 2018-01-03 DIAGNOSIS — Z7901 Long term (current) use of anticoagulants: Secondary | ICD-10-CM

## 2018-01-03 DIAGNOSIS — I4891 Unspecified atrial fibrillation: Secondary | ICD-10-CM | POA: Diagnosis not present

## 2018-01-03 LAB — COAGUCHEK XS/INR WAIVED
INR: 2.4 — ABNORMAL HIGH (ref 0.9–1.1)
Prothrombin Time: 28.8 s

## 2018-01-03 NOTE — Patient Instructions (Signed)
Description   Continue taking warfarin the same way as on calendar.  7.5mg  every day except for 5mg  on Thursday.  INR today is 2.4  Perfect reading goal is 2-3

## 2018-01-04 ENCOUNTER — Other Ambulatory Visit: Payer: Self-pay

## 2018-01-04 ENCOUNTER — Emergency Department (HOSPITAL_COMMUNITY)
Admission: EM | Admit: 2018-01-04 | Discharge: 2018-01-04 | Disposition: A | Discharge status: Home / Self Care | Payer: Medicare HMO | Attending: Emergency Medicine | Admitting: Emergency Medicine

## 2018-01-04 ENCOUNTER — Encounter (HOSPITAL_COMMUNITY): Payer: Self-pay | Admitting: Emergency Medicine

## 2018-01-04 ENCOUNTER — Ambulatory Visit: Payer: Medicare HMO

## 2018-01-04 ENCOUNTER — Emergency Department (HOSPITAL_COMMUNITY): Payer: Medicare HMO

## 2018-01-04 DIAGNOSIS — Z7901 Long term (current) use of anticoagulants: Secondary | ICD-10-CM | POA: Insufficient documentation

## 2018-01-04 DIAGNOSIS — R9431 Abnormal electrocardiogram [ECG] [EKG]: Secondary | ICD-10-CM | POA: Diagnosis not present

## 2018-01-04 DIAGNOSIS — I110 Hypertensive heart disease with heart failure: Secondary | ICD-10-CM | POA: Insufficient documentation

## 2018-01-04 DIAGNOSIS — Z79899 Other long term (current) drug therapy: Secondary | ICD-10-CM | POA: Diagnosis not present

## 2018-01-04 DIAGNOSIS — I5031 Acute diastolic (congestive) heart failure: Secondary | ICD-10-CM | POA: Insufficient documentation

## 2018-01-04 DIAGNOSIS — E119 Type 2 diabetes mellitus without complications: Secondary | ICD-10-CM | POA: Diagnosis not present

## 2018-01-04 DIAGNOSIS — J441 Chronic obstructive pulmonary disease with (acute) exacerbation: Secondary | ICD-10-CM

## 2018-01-04 DIAGNOSIS — Z794 Long term (current) use of insulin: Secondary | ICD-10-CM | POA: Insufficient documentation

## 2018-01-04 DIAGNOSIS — Z8572 Personal history of non-Hodgkin lymphomas: Secondary | ICD-10-CM | POA: Diagnosis not present

## 2018-01-04 DIAGNOSIS — R0602 Shortness of breath: Secondary | ICD-10-CM | POA: Diagnosis not present

## 2018-01-04 DIAGNOSIS — Z87891 Personal history of nicotine dependence: Secondary | ICD-10-CM | POA: Diagnosis not present

## 2018-01-04 LAB — CBC WITH DIFFERENTIAL/PLATELET
BASOS PCT: 1 %
Basophils Absolute: 0 10*3/uL (ref 0.0–0.1)
EOS ABS: 0.1 10*3/uL (ref 0.0–0.7)
EOS PCT: 1 %
HCT: 39.3 % (ref 39.0–52.0)
HEMOGLOBIN: 12.8 g/dL — AB (ref 13.0–17.0)
Lymphocytes Relative: 16 %
Lymphs Abs: 1 10*3/uL (ref 0.7–4.0)
MCH: 30.7 pg (ref 26.0–34.0)
MCHC: 32.6 g/dL (ref 30.0–36.0)
MCV: 94.2 fL (ref 78.0–100.0)
MONOS PCT: 7 %
Monocytes Absolute: 0.4 10*3/uL (ref 0.1–1.0)
NEUTROS PCT: 75 %
Neutro Abs: 4.9 10*3/uL (ref 1.7–7.7)
PLATELETS: 193 10*3/uL (ref 150–400)
RBC: 4.17 MIL/uL — ABNORMAL LOW (ref 4.22–5.81)
RDW: 13.2 % (ref 11.5–15.5)
WBC: 6.4 10*3/uL (ref 4.0–10.5)

## 2018-01-04 LAB — BASIC METABOLIC PANEL
ANION GAP: 8 (ref 5–15)
BUN: 13 mg/dL (ref 8–23)
CALCIUM: 9.1 mg/dL (ref 8.9–10.3)
CO2: 37 mmol/L — ABNORMAL HIGH (ref 22–32)
Chloride: 92 mmol/L — ABNORMAL LOW (ref 98–111)
Creatinine, Ser: 0.84 mg/dL (ref 0.61–1.24)
GFR calc Af Amer: >60 mL/min (ref >60)
Glucose, Bld: 184 mg/dL — ABNORMAL HIGH (ref 70–99)
Potassium: 4.4 mmol/L (ref 3.5–5.1)
SODIUM: 137 mmol/L (ref 135–145)

## 2018-01-04 LAB — TROPONIN I

## 2018-01-04 LAB — BRAIN NATRIURETIC PEPTIDE: B Natriuretic Peptide: 252 pg/mL — ABNORMAL HIGH (ref 0.0–100.0)

## 2018-01-04 MED ORDER — DOXYCYCLINE HYCLATE 100 MG PO CAPS: 100.0000 mg | ORAL_CAPSULE | Freq: Two times a day (BID) | ORAL | 0 refills | Status: DC

## 2018-01-04 MED ORDER — METHYLPREDNISOLONE SODIUM SUCC 125 MG IJ SOLR
125.0000 mg | Freq: Once | INTRAMUSCULAR | Status: AC
  Administered 2018-01-04: 125 mg via INTRAVENOUS
  Filled 2018-01-04: qty 2

## 2018-01-04 MED ORDER — ALBUTEROL SULFATE (2.5 MG/3ML) 0.083% IN NEBU: 2.5000 mg | INHALATION_SOLUTION | Freq: Four times a day (QID) | RESPIRATORY_TRACT | 12 refills | Status: DC | PRN

## 2018-01-04 MED ORDER — ALBUTEROL (5 MG/ML) CONTINUOUS INHALATION SOLN
10.0000 mg/h | INHALATION_SOLUTION | RESPIRATORY_TRACT | Status: AC
  Administered 2018-01-04: 10 mg/h via RESPIRATORY_TRACT
  Filled 2018-01-04: qty 20

## 2018-01-04 MED ORDER — PREDNISONE 20 MG PO TABS: 40.0000 mg | ORAL_TABLET | Freq: Every day | ORAL | 0 refills | Status: DC

## 2018-01-04 NOTE — ED Notes (Signed)
Patient ambulated with pulse ox and oxygen at 3lpm.  Patient maintained o2 sat of 92-93% Patient denies any SOB while walking.

## 2018-01-04 NOTE — ED Triage Notes (Addendum)
PT c/o SOB worsening on exertion over the past 2 days. PT states hx of COPD and is on 3L of oxygen at all times. PT denies any pain at this time. PT states no relief from his neb tx at home.

## 2018-01-04 NOTE — Discharge Instructions (Signed)
These remember that all medications change the way that Coumadin works so by taking these new medications you will likely have some changes in your Coumadin level and this should be followed up with your doctor within 5 days.  Please take doxycycline, 100 mg twice a day for the next 10 days, take prednisone 40 mg daily for 5 days and be aware that this may cause your blood sugar to go up.  Eat a diabetic healthy diet low in carbohydrates.  Continue albuterol use, 1 treatment every 4 hours as needed,  Seek medical attention in the emergency department for severe or worsening symptoms, otherwise follow-up with your doctor for a recheck within 5 days.

## 2018-01-04 NOTE — ED Provider Notes (Signed)
[facility] Provider Note   CSN: [id number] Arrival date & time: 01/04/18  1109     History   Chief Complaint Chief Complaint  Patient presents with  . Shortness of Breath    HPI Justin Ruiz is a 67 y.o. male.  HPI  67 year old male, has a known history of COPD, he has had this history for many many years, he stopped smoking 2 days ago because firstly he could not breathe very well and secondly he wanted to make positive change.  That being said over the last several days he has had some progressive shortness of breath, wheezing and tightness.  He denies any extra coughing, uses 3-1/2 L of oxygen by nasal cannula chronically, cannot remember the last time he had prednisone or the last time he was admitted to the hospital but thinks it was last year, has been using albuterol treatments throughout the day getting some only intermittent temporary relief.  There is been no fevers, no increased swelling of the legs, this tightness in the chest is been persistent associated with wheezing.  He is accompanied by his wife who gives additional history saying that he did stop smoking for 20 years but then started smoking again, he only recently in the last 48 hours stopped.  He reports that he looks more dyspneic when he is walking and has trouble sleeping at night because of shortness of breath last couple of days  Past Medical History:  Diagnosis Date  . Acute diastolic heart failure ([place]) 03/28/2016  . Acute encephalopathy 03/28/2016  . Acute on chronic respiratory failure with hypoxia ([place]) 12/04/2016  . Arthritis   . Asthma   . Atrial fibrillation ([place])    on AC  . Benign neoplasm of ascending colon 05/05/2014  . BPH (benign prostatic hypertrophy)   . Chronic anticoagulation 08/20/2012  . Chronic respiratory failure with hypoxia ([place]) 11/10/2017  . Colon polyps   . COPD mixed type ([place]) 03/28/2016   PFT 06/17/14- severe obstructive airways disease with slight response  to bronchodilator. FVC 2.03/42%, FEV1 1.02/28%, ratio 0.5, TLC 82%, DLCO 59%  . Diabetes mellitus   . Diabetic neuropathy, type II diabetes mellitus ([place]) 09/25/2014  . Diabetic retinopathy ([place]) 05/28/2017  . 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 GERD (gastroesophageal reflux disease)   . HTN (hypertension)    x 3 years  . Hyperlipidemia   . Lung nodules 03/11/2013  . Mediastinal adenopathy 06/20/2013   CT  & PET 2/15   . Memory loss   . 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 NHL (non-Hodgkin's lymphoma) ([place])    nhl dx 9/11  . Obesity    exogenous  . Obstructive sleep apnea 05/25/2014   NPSG-  04/07/2014-severe obstructive sleep apnea-AHI 75.1 per hour. CPAP titrated to 16. He required supplemental oxygen at 2 L/ [employer] which he already wears for sleep at home. Weight 338 pounds   . Peripheral vascular insufficiency ([place]) 08/02/2017  . Shingles   . Sleep apnea    CPAP machine is broken  . Thrombocytopenia ([place]) 11/23/2016    Patient Active Problem List   Diagnosis Date Noted  . Chronic respiratory failure with hypoxia ([place]) 11/10/2017  . Peripheral vascular insufficiency ([place]) 08/02/2017  . Diabetic retinopathy ([place]) 05/28/2017  . Acute on chronic respiratory failure with hypoxia ([place]) 12/04/2016  . Thrombocytopenia ([place]) 11/23/2016  . Memory loss 04/13/2016  . Hypomagnesemia   . Morbid obesity ([place]) 03/29/2016  .  Hypoglycemia 03/28/2016  . Acute encephalopathy 03/28/2016  . Acute diastolic heart failure ([place]) 03/28/2016  . COPD mixed type ([place]) 03/28/2016  . Atrial fibrillation with RVR ([place]) 03/28/2016  . 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 Chronic diastolic heart failure ([place])   . CAP (community acquired pneumonia)   . Sepsis ([place]) 06/07/2015  . Diabetic neuropathy, type II diabetes mellitus ([place])  09/25/2014  . Obstructive sleep apnea 05/25/2014  . Benign neoplasm of ascending colon 05/05/2014  . Excessive daytime sleepiness 01/24/2014  . Mediastinal adenopathy 06/20/2013  . Severe obesity (BMI >= 40) ([place]) 05/01/2013  . Generalized anxiety disorder 05/01/2013  . Lung nodules 03/11/2013  . Chronic anticoagulation 08/20/2012  . NHL (non-Hodgkin's lymphoma) ([place]) 01/26/2010  . Obesity, morbid ([place]) 04/27/2009  . CARDIOVASCULAR STUDIES, ABNORMAL 04/27/2009  . ABNORMAL STRESS ELECTROCARDIOGRAM 03/25/2009  . PERSONAL HISTORY OF COLONIC POLYPS 03/02/2009  . Diabetes mellitus type 2, insulin dependent ([place]) 04/29/2007  . Hyperlipidemia 04/29/2007  . Hypertension 04/29/2007  . Seasonal and perennial allergic rhinitis 04/29/2007  . COPD exacerbation ([place]) 04/29/2007  . G E R D 04/29/2007  . BENIGN PROSTATIC HYPERTROPHY, HX OF 04/29/2007    Past Surgical History:  Procedure Laterality Date  . APPENDECTOMY    . COLONOSCOPY WITH PROPOFOL N/A 05/05/2014   Procedure: COLONOSCOPY WITH PROPOFOL;  Surgeon: Inda Castle, MD;  Location: [facility] ENDOSCOPY;  Service: Endoscopy;  Laterality: N/A;        Home Medications    Prior to Admission medications   Medication Sig Start Date End Date Taking? Authorizing Provider  ALPRAZolam (XANAX) 0.25 MG tablet TAKE 1 TABLET BY MOUTH THREE TIMES DAILY AS NEEDED FOR ANXIETY 07/24/17  Yes Hassell Done, Mary-Margaret, FNP  Cholecalciferol (VITAMIN D3) 5000 UNITS TABS Take 1 tablet by mouth every morning.   Yes [provider]  digoxin (LANOXIN) 0.25 MG tablet Take 0.25 mg by mouth daily.   Yes [provider]  fenofibrate 160 MG tablet Take 1 tablet (160 mg total) by mouth daily. 03/28/17  Yes Chipper Herb, MD  fluticasone [facility]) 50 MCG/ACT nasal spray Place 2 sprays into both nostrils daily. 08/02/17  Yes Chipper Herb, MD  Fluticasone-Umeclidin-Vilant (TRELEGY ELLIPTA) 100-62.5-25 MCG/INH AEPB Inhale 1 puff into the lungs daily. 09/25/17   Yes Magdalen Spatz, NP  furosemide (LASIX) 40 MG tablet Take 1 tablet (40 mg total) by mouth 2 (two) times daily. As directed 10/09/17  Yes Chipper Herb, MD  insulin NPH Human (NOVOLIN N RELION) 100 UNIT/ML injection Inject 0.5 mLs (50 Units total) into the skin daily before breakfast. 10/09/17  Yes Chipper Herb, MD  insulin regular (NOVOLIN R,HUMULIN R) 100 units/mL injection Inject 0.08-0.09 mLs (8-9 Units total) into the skin 2 (two) times daily before a meal. 10/09/17  Yes Chipper Herb, MD  ipratropium-albuterol (DUONEB) 0.5-2.5 (3) MG/3ML SOLN PLACE 1 VIAL (3 MLS) IN THE NEBULIZER EVERY 6 HOURS AS NEEDED 03/27/17  Yes Chipper Herb, MD  metFORMIN (GLUCOPHAGE) 500 MG tablet TAKE 1 TABLET BY MOUTH IN THE MORNING AND 2 IN THE EVENING 10/09/17  Yes Chipper Herb, MD  metoprolol tartrate (LOPRESSOR) 100 MG tablet Take 1 tablet (100 mg total) by mouth 2 (two) times daily. 10/09/17  Yes Chipper Herb, MD  PARoxetine (PAXIL) 20 MG tablet Take 1 tablet (20 mg total) by mouth daily. 10/09/17  Yes Chipper Herb, MD  simvastatin (  ZOCOR) 40 MG tablet Take 1 tablet (40 mg total) by mouth daily. 10/09/17  Yes Chipper Herb, MD  theophylline (THEO-24) 400 MG 24 hr capsule Take 1 capsule (400 mg total) by mouth daily. 11/22/17  Yes Chesley Mires, MD  warfarin (COUMADIN) 5 MG tablet Take 1 tablet (5 mg total) by mouth daily. Or as directed by anticoagulation clinic 10/09/17  Yes Chipper Herb, MD  albuterol (PROVENTIL) (2.5 MG/3ML) 0.083% nebulizer solution Take 3 mLs (2.5 mg total) by nebulization every 6 (six) hours as needed for wheezing or shortness of breath. 01/04/18   Noemi Chapel, MD  Blood Glucose Monitoring Suppl (CLEVER CHEK AUTO-CODE VOICE) DEVI USE AS DIRECTED 11/13/12   Chipper Herb, MD  doxycycline (VIBRAMYCIN) 100 MG capsule Take 1 capsule (100 mg total) by mouth 2 (two) times daily. 01/04/18   Noemi Chapel, MD  Lancet Devices (LANCING DEVICE) MISC USE AS DIRECTED 11/13/12   Chipper Herb, MD  predniSONE (DELTASONE) 20 MG tablet Take 2 tablets (40 mg total) by mouth daily. 01/04/18   Noemi Chapel, MD    Family History Family History  Problem Relation Age of Onset  . Liver cancer Mother   . Diabetes Mother   . Heart disease Mother   . Colon cancer Father   . Prostate cancer Father   . Colon polyps Father   . Pulmonary embolism Sister   . Diabetes Sister   . Heart attack Sister   . Aortic aneurysm Sister   . COPD Sister   . Heart disease Sister   . COPD Sister   . Heart disease Sister   . Diabetes Maternal Grandmother     Social History Social History   Tobacco Use  . Smoking status: Former Smoker    Packs/day: 1.00    Years: 40.00    Pack years: 40.00    Types: Cigarettes    Start date: 12/17/1968    Last attempt to quit: 11/14/2004    Years since quitting: 13.1  . Smokeless tobacco: Never Used  . Tobacco comment: 2 ppd   Substance Use Topics  . Alcohol use: No    Alcohol/week: 0.0 standard drinks    Comment: previous  . Drug use: No     Allergies   Avapro [irbesartan] and Lipitor [atorvastatin calcium]   Review of Systems Review of Systems  All other systems reviewed and are negative.    Physical Exam Updated Vital Signs BP 124/81   Pulse 73   Temp 98.3 F (36.8 C) (Oral)   Resp 17   Ht 1.778 m (5\' 10" )   Wt 132.9 kg   SpO2 96%   BMI 42.04 kg/m   Physical Exam  Constitutional: He appears well-developed and well-nourished. No distress.  HENT:  Head: Normocephalic and atraumatic.  Mouth/Throat: Oropharynx is clear and moist. No oropharyngeal exudate.  Eyes: Pupils are equal, round, and reactive to light. Conjunctivae and EOM are normal. Right eye exhibits no discharge. Left eye exhibits no discharge. No scleral icterus.  Neck: Normal range of motion. Neck supple. No JVD present. No thyromegaly present.  Cardiovascular: Normal rate, normal heart sounds and intact distal pulses. Exam reveals no gallop and no friction rub.  No  murmur heard. Normal rate, slightly irregular rhythm, normal pulses  Pulmonary/Chest: He is in respiratory distress. He has wheezes. He has no rales.  The patient has expiratory wheezing in all 4 lung fields anterior and posterior, there is a prolonged expiratory phase, he speaks in  short and sentences, he has a mild tachypnea.  Abdominal: Soft. Bowel sounds are normal. He exhibits no distension and no mass. There is no tenderness.  Musculoskeletal: Normal range of motion. He exhibits edema ( Mild edema to the lower extremities). He exhibits no tenderness.  Lymphadenopathy:    He has no cervical adenopathy.  Neurological: He is alert. Coordination normal.  Skin: Skin is warm and dry. No rash noted. No erythema.  Psychiatric: He has a normal mood and affect. His behavior is normal.  Nursing note and vitals reviewed.    ED Treatments / Results  Labs (all labs ordered are listed, but only abnormal results are displayed) Labs Reviewed  BASIC METABOLIC PANEL - Abnormal; Notable for the following components:      Result Value   Chloride 92 (*)    CO2 37 (*)    Glucose, Bld 184 (*)    All other components within normal limits  BRAIN NATRIURETIC PEPTIDE - Abnormal; Notable for the following components:   B Natriuretic Peptide 252.0 (*)    All other components within normal limits  CBC WITH DIFFERENTIAL/PLATELET - Abnormal; Notable for the following components:   RBC 4.17 (*)    Hemoglobin 12.8 (*)    All other components within normal limits  TROPONIN I    EKG EKG Interpretation  Date/Time:  Thursday January 04 2018 11:30:11 EDT Ventricular Rate:  69 PR Interval:    QRS Duration: 76 QT Interval:  398 QTC Calculation: 426 R Axis:   72 Text Interpretation:  Atrial fibrillation Nonspecific ST abnormality Abnormal ECG Since last tracing rate slower Confirmed by Noemi Chapel [phone]) on 01/04/2018 12:36:09 PM   Radiology Dg Chest 2 View  Result Date: 01/04/2018 CLINICAL DATA:   Shortness of breath with weakness EXAM: CHEST - 2 VIEW COMPARISON:  09/25/2017 FINDINGS: Chronic interstitial coarsening and borderline cardiomegaly. Stable mild scarring at the right more than left base, seen since 2016 chest CT. There is no edema, consolidation, effusion, or pneumothorax. Spondylosis. IMPRESSION: No acute finding when compared to prior. Electronically Signed   By: Monte Fantasia M.D.   On: 01/04/2018 11:51    Procedures Procedures (including critical care time)  Medications Ordered in ED Medications  albuterol (PROVENTIL,VENTOLIN) solution continuous neb (0 mg/hr Nebulization Stopped 01/04/18 1422)  methylPREDNISolone sodium succinate (SOLU-MEDROL) 125 mg/2 mL injection 125 mg (125 mg Intravenous Given 01/04/18 1325)     Initial Impression / Assessment and Plan / ED Course  I have reviewed the triage vital signs and the nursing notes.  Pertinent labs & imaging results that were available during my care of the patient were reviewed by me and considered in my medical decision making (see chart for details).     EKG shows chronic A. Fib Chest x-ray shows no signs of consolidation or effusion Labs pending,  The patient is definitely ill with relating to his COPD, this is more of a chronic issue, he is on 3 and half liters and at this point his oxygen is 95+ percent, he still appears slightly dyspneic, at this point I would recommend continue his nebulizer therapy, steroids and repeat exam.  May need admission versus discharge depending on improvement.  Improved significantly - and ambulated without difficulty Stable for d/c Pt agreeable to return for severe or worsening symptoms.  Final Clinical Impressions(s) / ED Diagnoses   Final diagnoses:  COPD exacerbation [facility])    ED Discharge Orders         Ordered  predniSONE (DELTASONE) 20 MG tablet  Daily     01/04/18 1624    doxycycline (VIBRAMYCIN) 100 MG capsule  2 times daily     01/04/18 1624    albuterol  (PROVENTIL) (2.5 MG/3ML) 0.083% nebulizer solution  Every 6 hours PRN     01/04/18 1624           Noemi Chapel, MD 01/04/18 1628

## 2018-01-09 DIAGNOSIS — J439 Emphysema, unspecified: Secondary | ICD-10-CM | POA: Diagnosis not present

## 2018-01-09 DIAGNOSIS — J449 Chronic obstructive pulmonary disease, unspecified: Secondary | ICD-10-CM | POA: Diagnosis not present

## 2018-01-09 DIAGNOSIS — J9611 Chronic respiratory failure with hypoxia: Secondary | ICD-10-CM | POA: Diagnosis not present

## 2018-01-23 DIAGNOSIS — L84 Corns and callosities: Secondary | ICD-10-CM | POA: Diagnosis not present

## 2018-01-23 DIAGNOSIS — M79676 Pain in unspecified toe(s): Secondary | ICD-10-CM | POA: Diagnosis not present

## 2018-01-23 DIAGNOSIS — E1142 Type 2 diabetes mellitus with diabetic polyneuropathy: Secondary | ICD-10-CM | POA: Diagnosis not present

## 2018-01-23 DIAGNOSIS — B351 Tinea unguium: Secondary | ICD-10-CM | POA: Diagnosis not present

## 2018-01-29 ENCOUNTER — Ambulatory Visit (INDEPENDENT_AMBULATORY_CARE_PROVIDER_SITE_OTHER): Payer: Medicare HMO

## 2018-01-29 DIAGNOSIS — Z23 Encounter for immunization: Secondary | ICD-10-CM | POA: Diagnosis not present

## 2018-02-08 DIAGNOSIS — J439 Emphysema, unspecified: Secondary | ICD-10-CM | POA: Diagnosis not present

## 2018-02-08 DIAGNOSIS — J449 Chronic obstructive pulmonary disease, unspecified: Secondary | ICD-10-CM | POA: Diagnosis not present

## 2018-02-08 DIAGNOSIS — J9611 Chronic respiratory failure with hypoxia: Secondary | ICD-10-CM | POA: Diagnosis not present

## 2018-02-12 ENCOUNTER — Other Ambulatory Visit: Payer: Self-pay | Admitting: Nurse Practitioner

## 2018-02-13 NOTE — Telephone Encounter (Signed)
Last seen 08/02/17

## 2018-02-21 ENCOUNTER — Ambulatory Visit (INDEPENDENT_AMBULATORY_CARE_PROVIDER_SITE_OTHER): Payer: Medicare HMO | Admitting: Pharmacist Clinician (PhC)/ Clinical Pharmacy Specialist

## 2018-02-21 DIAGNOSIS — I4891 Unspecified atrial fibrillation: Secondary | ICD-10-CM

## 2018-02-21 DIAGNOSIS — Z7901 Long term (current) use of anticoagulants: Secondary | ICD-10-CM

## 2018-02-21 LAB — COAGUCHEK XS/INR WAIVED
INR: 2.9 — AB (ref 0.9–1.1)
Prothrombin Time: 34.2 s

## 2018-02-21 NOTE — Patient Instructions (Signed)
Description   Continue taking warfarin the same way as on calendar.  7.5mg  every day except for 5mg  on Thursday.  INR today is 2.9  Perfect reading goal is 2-3

## 2018-02-24 ENCOUNTER — Emergency Department (HOSPITAL_COMMUNITY): Payer: Medicare HMO

## 2018-02-24 ENCOUNTER — Other Ambulatory Visit: Payer: Self-pay

## 2018-02-24 ENCOUNTER — Emergency Department (HOSPITAL_COMMUNITY)
Admission: EM | Admit: 2018-02-24 | Discharge: 2018-02-24 | Disposition: A | Discharge status: Home / Self Care | Payer: Medicare HMO | Attending: Emergency Medicine | Admitting: Emergency Medicine

## 2018-02-24 ENCOUNTER — Encounter (HOSPITAL_COMMUNITY): Payer: Self-pay | Admitting: Emergency Medicine

## 2018-02-24 DIAGNOSIS — Z7901 Long term (current) use of anticoagulants: Secondary | ICD-10-CM | POA: Diagnosis not present

## 2018-02-24 DIAGNOSIS — I10 Essential (primary) hypertension: Secondary | ICD-10-CM | POA: Diagnosis not present

## 2018-02-24 DIAGNOSIS — J441 Chronic obstructive pulmonary disease with (acute) exacerbation: Secondary | ICD-10-CM | POA: Diagnosis not present

## 2018-02-24 DIAGNOSIS — R0602 Shortness of breath: Secondary | ICD-10-CM | POA: Diagnosis not present

## 2018-02-24 DIAGNOSIS — Z79899 Other long term (current) drug therapy: Secondary | ICD-10-CM | POA: Insufficient documentation

## 2018-02-24 DIAGNOSIS — Z8572 Personal history of non-Hodgkin lymphomas: Secondary | ICD-10-CM | POA: Insufficient documentation

## 2018-02-24 DIAGNOSIS — Z794 Long term (current) use of insulin: Secondary | ICD-10-CM | POA: Insufficient documentation

## 2018-02-24 DIAGNOSIS — E119 Type 2 diabetes mellitus without complications: Secondary | ICD-10-CM | POA: Diagnosis not present

## 2018-02-24 DIAGNOSIS — Z87891 Personal history of nicotine dependence: Secondary | ICD-10-CM | POA: Diagnosis not present

## 2018-02-24 LAB — CBC WITH DIFFERENTIAL/PLATELET
Abs Immature Granulocytes: 0.01 10*3/uL (ref 0.00–0.07)
BASOS PCT: 1 %
Basophils Absolute: 0 10*3/uL (ref 0.0–0.1)
Eosinophils Absolute: 0.1 10*3/uL (ref 0.0–0.5)
Eosinophils Relative: 2 %
HEMATOCRIT: 46 % (ref 39.0–52.0)
HEMOGLOBIN: 14 g/dL (ref 13.0–17.0)
Immature Granulocytes: 0 %
Lymphocytes Relative: 19 %
Lymphs Abs: 1.4 10*3/uL (ref 0.7–4.0)
MCH: 29 pg (ref 26.0–34.0)
MCHC: 30.4 g/dL (ref 30.0–36.0)
MCV: 95.4 fL (ref 80.0–100.0)
MONO ABS: 0.6 10*3/uL (ref 0.1–1.0)
MONOS PCT: 8 %
NEUTROS ABS: 5.3 10*3/uL (ref 1.7–7.7)
Neutrophils Relative %: 70 %
PLATELETS: 217 10*3/uL (ref 150–400)
RBC: 4.82 MIL/uL (ref 4.22–5.81)
RDW: 13.4 % (ref 11.5–15.5)
WBC: 7.4 10*3/uL (ref 4.0–10.5)
nRBC: 0 % (ref 0.0–0.2)

## 2018-02-24 LAB — COMPREHENSIVE METABOLIC PANEL
ALBUMIN: 3.7 g/dL (ref 3.5–5.0)
ALK PHOS: 68 U/L (ref 38–126)
ALT: 16 U/L (ref 0–44)
AST: 25 U/L (ref 15–41)
Anion gap: 9 (ref 5–15)
BUN: 18 mg/dL (ref 8–23)
CO2: 37 mmol/L — ABNORMAL HIGH (ref 22–32)
Calcium: 8.9 mg/dL (ref 8.9–10.3)
Chloride: 93 mmol/L — ABNORMAL LOW (ref 98–111)
Creatinine, Ser: 1.34 mg/dL — ABNORMAL HIGH (ref 0.61–1.24)
GFR calc Af Amer: >60 mL/min (ref >60)
GFR calc non Af Amer: 53 mL/min — ABNORMAL LOW (ref >60)
GLUCOSE: 227 mg/dL — AB (ref 70–99)
POTASSIUM: 4.7 mmol/L (ref 3.5–5.1)
Sodium: 139 mmol/L (ref 135–145)
TOTAL PROTEIN: 7.4 g/dL (ref 6.5–8.1)
Total Bilirubin: 1 mg/dL (ref 0.3–1.2)

## 2018-02-24 LAB — BRAIN NATRIURETIC PEPTIDE: B Natriuretic Peptide: 202 pg/mL — ABNORMAL HIGH (ref 0.0–100.0)

## 2018-02-24 LAB — TROPONIN I: Troponin I: <0.03 ng/mL (ref <0.03)

## 2018-02-24 MED ORDER — METHYLPREDNISOLONE SODIUM SUCC 125 MG IJ SOLR
125.0000 mg | Freq: Once | INTRAMUSCULAR | Status: AC
  Administered 2018-02-24: 125 mg via INTRAVENOUS
  Filled 2018-02-24: qty 2

## 2018-02-24 MED ORDER — PREDNISONE 20 MG PO TABS: 40.0000 mg | ORAL_TABLET | Freq: Every day | ORAL | 0 refills | Status: AC

## 2018-02-24 MED ORDER — ALBUTEROL (5 MG/ML) CONTINUOUS INHALATION SOLN
10.0000 mg/h | INHALATION_SOLUTION | RESPIRATORY_TRACT | Status: DC
  Administered 2018-02-24: 10 mg/h via RESPIRATORY_TRACT
  Filled 2018-02-24: qty 20

## 2018-02-24 NOTE — Discharge Instructions (Signed)
As discussed, you have been diagnosed with a COPD exacerbation. It is very important that you monitor your condition carefully, and do not hesitate to return here for any concerning changes. Please take prescribed steroids for the next 4 days, and as discussed, use your albuterol every 4 hours for the next 2 days. Equally important that you stop smoking cigarettes.

## 2018-02-24 NOTE — ED Triage Notes (Signed)
On home O2  3L  Report increasing SOB since yesterday  Supposed to be on o2 all of the time, but came into dept without O2

## 2018-02-24 NOTE — Progress Notes (Signed)
Patient taken off BIPAP and placed on 3 lpm nasal cannula. BIPAP still at bedside on standby if needed. RN notified.

## 2018-02-24 NOTE — ED Notes (Signed)
ED Provider at bedside. 

## 2018-02-24 NOTE — ED Provider Notes (Addendum)
[facility] EMERGENCY DEPARTMENT Provider Note   CSN: [id number] Arrival date & time: 02/24/18  1805     History   Chief Complaint Chief Complaint  Patient presents with  . Shortness of Breath    HPI Justin Ruiz is a 67 y.o. male.  HPI Patient presents with dyspnea, fatigue. Patient has multiple medical issues including CHF, COPD, continues to smoke cigarettes. Is a poor historian, but seems as over the past day or so he has had worsening dyspnea in spite of using his home oxygen, 3 L, 24/7. He denies fever, chills, pain. He is here with his wife who assists with the HPI.  Past Medical History:  Diagnosis Date  . Acute diastolic heart failure ([place]) 03/28/2016  . Acute encephalopathy 03/28/2016  . Acute on chronic respiratory failure with hypoxia ([place]) 12/04/2016  . Arthritis   . Asthma   . Atrial fibrillation ([place])    on AC  . Benign neoplasm of ascending colon 05/05/2014  . BPH (benign prostatic hypertrophy)   . Chronic anticoagulation 08/20/2012  . Chronic respiratory failure with hypoxia ([place]) 11/10/2017  . Colon polyps   . COPD mixed type ([place]) 03/28/2016   PFT 06/17/14- severe obstructive airways disease with slight response to bronchodilator. FVC 2.03/42%, FEV1 1.02/28%, ratio 0.5, TLC 82%, DLCO 59%  . Diabetes mellitus   . Diabetic neuropathy, type II diabetes mellitus ([place]) 09/25/2014  . Diabetic retinopathy ([place]) 05/28/2017  . 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 GERD (gastroesophageal reflux disease)   . HTN (hypertension)    x 3 years  . Hyperlipidemia   . Lung nodules 03/11/2013  . Mediastinal adenopathy 06/20/2013   CT  & PET 2/15   . Memory loss   . 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 NHL (non-Hodgkin's lymphoma) ([place])    nhl dx 9/11  . Obesity    exogenous  . Obstructive sleep apnea 05/25/2014   NPSG-  04/07/2014-severe obstructive sleep apnea-AHI 75.1 per hour. CPAP titrated to 16.  He required supplemental oxygen at 2 L/ [employer] which he already wears for sleep at home. Weight 338 pounds   . Peripheral vascular insufficiency ([place]) 08/02/2017  . Shingles   . Sleep apnea    CPAP machine is broken  . Thrombocytopenia ([place]) 11/23/2016    Patient Active Problem List   Diagnosis Date Noted  . Chronic respiratory failure with hypoxia ([place]) 11/10/2017  . Peripheral vascular insufficiency ([place]) 08/02/2017  . Diabetic retinopathy ([place]) 05/28/2017  . Acute on chronic respiratory failure with hypoxia ([place]) 12/04/2016  . Thrombocytopenia ([place]) 11/23/2016  . Memory loss 04/13/2016  . Hypomagnesemia   . Morbid obesity ([place]) 03/29/2016  . Hypoglycemia 03/28/2016  . Acute encephalopathy 03/28/2016  . Acute diastolic heart failure ([place]) 03/28/2016  . COPD mixed type ([place]) 03/28/2016  . Atrial fibrillation with RVR ([place]) 03/28/2016  . 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 Chronic diastolic heart failure ([place])   . CAP (community acquired pneumonia)   . Sepsis ([place]) 06/07/2015  . Diabetic neuropathy, type II diabetes mellitus ([place]) 09/25/2014  . Obstructive sleep apnea 05/25/2014  . Benign neoplasm of ascending colon 05/05/2014  . Excessive daytime sleepiness 01/24/2014  . Mediastinal adenopathy 06/20/2013  . Severe obesity (BMI >= 40) ([place]) 05/01/2013  . Generalized anxiety disorder 05/01/2013  . Lung nodules 03/11/2013  . Chronic anticoagulation 08/20/2012  . NHL (  non-Hodgkin's lymphoma) ([place]) 01/26/2010  . Obesity, morbid ([place]) 04/27/2009  . CARDIOVASCULAR STUDIES, ABNORMAL 04/27/2009  . ABNORMAL STRESS ELECTROCARDIOGRAM 03/25/2009  . PERSONAL HISTORY OF COLONIC POLYPS 03/02/2009  . Diabetes mellitus type 2, insulin dependent ([place]) 04/29/2007  . Hyperlipidemia 04/29/2007  . Hypertension 04/29/2007  . Seasonal and perennial allergic rhinitis 04/29/2007  . COPD  exacerbation ([place]) 04/29/2007  . G E R D 04/29/2007  . BENIGN PROSTATIC HYPERTROPHY, HX OF 04/29/2007    Past Surgical History:  Procedure Laterality Date  . APPENDECTOMY    . COLONOSCOPY WITH PROPOFOL N/A 05/05/2014   Procedure: COLONOSCOPY WITH PROPOFOL;  Surgeon: Inda Castle, MD;  Location: [facility] ENDOSCOPY;  Service: Endoscopy;  Laterality: N/A;        Home Medications    Prior to Admission medications   Medication Sig Start Date End Date Taking? Authorizing Provider  ALPRAZolam Duanne Moron) 0.25 MG tablet TAKE 1 TABLET BY MOUTH THREE TIMES DAILY AS NEEDED FOR ANXIETY 02/13/18  Yes Chipper Herb, MD  Cholecalciferol (VITAMIN D3) 5000 UNITS TABS Take 1 tablet by mouth every morning.   Yes [provider]  digoxin (LANOXIN) 0.25 MG tablet Take 0.25 mg by mouth daily.   Yes [provider]  fenofibrate 160 MG tablet Take 1 tablet (160 mg total) by mouth daily. 03/28/17  Yes Chipper Herb, MD  fluticasone [facility]) 50 MCG/ACT nasal spray Place 2 sprays into both nostrils daily. 08/02/17  Yes Chipper Herb, MD  Fluticasone-Umeclidin-Vilant (TRELEGY ELLIPTA) 100-62.5-25 MCG/INH AEPB Inhale 1 puff into the lungs daily. 09/25/17  Yes Magdalen Spatz, NP  furosemide (LASIX) 40 MG tablet Take 1 tablet (40 mg total) by mouth 2 (two) times daily. As directed 10/09/17  Yes Chipper Herb, MD  insulin NPH Human (NOVOLIN N RELION) 100 UNIT/ML injection Inject 0.5 mLs (50 Units total) into the skin daily before breakfast. 10/09/17  Yes Chipper Herb, MD  insulin regular (NOVOLIN R,HUMULIN R) 100 units/mL injection Inject 0.08-0.09 mLs (8-9 Units total) into the skin 2 (two) times daily before a meal. 10/09/17  Yes Chipper Herb, MD  ipratropium-albuterol (DUONEB) 0.5-2.5 (3) MG/3ML SOLN PLACE 1 VIAL (3 MLS) IN THE NEBULIZER EVERY 6 HOURS AS NEEDED Patient taking differently: Inhale 3 mLs into the lungs every 6 (six) hours as needed (shortness of breath). PLACE 1 VIAL (3 MLS) IN THE  NEBULIZER EVERY 6 HOURS AS NEEDED 03/27/17  Yes Chipper Herb, MD  metFORMIN (GLUCOPHAGE) 500 MG tablet TAKE 1 TABLET BY MOUTH IN THE MORNING AND 2 IN THE EVENING 10/09/17  Yes Chipper Herb, MD  metoprolol tartrate (LOPRESSOR) 100 MG tablet Take 1 tablet (100 mg total) by mouth 2 (two) times daily. 10/09/17  Yes Chipper Herb, MD  oxymetazoline (AFRIN) 0.05 % nasal spray Place 1 spray into both nostrils 2 (two) times daily as needed for congestion.   Yes [provider]  PARoxetine (PAXIL) 20 MG tablet Take 1 tablet (20 mg total) by mouth daily. 10/09/17  Yes Chipper Herb, MD  simvastatin (ZOCOR) 40 MG tablet Take 1 tablet (40 mg total) by mouth daily. 10/09/17  Yes Chipper Herb, MD  warfarin (COUMADIN) 5 MG tablet Take 1 tablet (5 mg total) by mouth daily. Or as directed by anticoagulation clinic Patient taking differently: Take 5-7.5 mg by mouth See admin instructions. Take 7.5 mg in the morning, except on Thursdays take 5 mg. 10/09/17  Yes Chipper Herb, MD  albuterol (PROVENTIL) (2.5  MG/3ML) 0.083% nebulizer solution Take 3 mLs (2.5 mg total) by nebulization every 6 (six) hours as needed for wheezing or shortness of breath. 01/04/18   Noemi Chapel, MD  Blood Glucose Monitoring Suppl (CLEVER CHEK AUTO-CODE VOICE) DEVI USE AS DIRECTED 11/13/12   Chipper Herb, MD  doxycycline (VIBRAMYCIN) 100 MG capsule Take 1 capsule (100 mg total) by mouth 2 (two) times daily. 01/04/18   Noemi Chapel, MD  Lancet Devices (LANCING DEVICE) MISC USE AS DIRECTED 11/13/12   Chipper Herb, MD  predniSONE (DELTASONE) 20 MG tablet Take 2 tablets (40 mg total) by mouth daily. 01/04/18   Noemi Chapel, MD  theophylline (THEO-24) 400 MG 24 hr capsule Take 1 capsule (400 mg total) by mouth daily. 11/22/17   Chesley Mires, MD    Family History Family History  Problem Relation Age of Onset  . Liver cancer Mother   . Diabetes Mother   . Heart disease Mother   . Colon cancer Father   . Prostate cancer  Father   . Colon polyps Father   . Pulmonary embolism Sister   . Diabetes Sister   . Heart attack Sister   . Aortic aneurysm Sister   . COPD Sister   . Heart disease Sister   . COPD Sister   . Heart disease Sister   . Diabetes Maternal Grandmother     Social History Social History   Tobacco Use  . Smoking status: Former Smoker    Packs/day: 1.00    Years: 40.00    Pack years: 40.00    Types: Cigarettes    Start date: 12/17/1968    Last attempt to quit: 11/14/2004    Years since quitting: 13.2  . Smokeless tobacco: Never Used  . Tobacco comment: 2 ppd   Substance Use Topics  . Alcohol use: No    Alcohol/week: 0.0 standard drinks    Comment: previous  . Drug use: No     Allergies   Avapro [irbesartan] and Lipitor [atorvastatin calcium]   Review of Systems Review of Systems  Constitutional:       Per HPI, otherwise negative  HENT: Positive for facial swelling.        Per HPI, otherwise negative  Respiratory:       Per HPI, otherwise negative  Cardiovascular:       Per HPI, otherwise negative  Gastrointestinal: Negative for vomiting.  Endocrine:       Negative aside from HPI  Genitourinary:       Neg aside from HPI   Musculoskeletal:       Per HPI, otherwise negative  Skin: Negative.   Neurological: Negative for syncope.     Physical Exam Updated Vital Signs BP 117/81   Pulse 77   Temp 98.2 F (36.8 C) (Oral)   Resp 20   Ht 5\' 9"  (1.753 m)   Wt 133.8 kg   SpO2 97%   BMI 43.56 kg/m   Physical Exam  Constitutional: He is oriented to person, place, and time. He appears well-developed. He appears ill.  HENT:  Head: Normocephalic and atraumatic.  Eyes: Conjunctivae and EOM are normal.  Cardiovascular: Normal rate and regular rhythm.  Pulmonary/Chest: Tachypnea noted. He is in respiratory distress. He has decreased breath sounds.  Abdominal: He exhibits no distension.  Musculoskeletal: He exhibits no edema.  Neurological: He is alert and oriented  to person, place, and time.  Skin: Skin is warm and dry.  Psychiatric: He has a normal mood and  affect.  Nursing note and vitals reviewed.    ED Treatments / Results  Labs (all labs ordered are listed, but only abnormal results are displayed) Labs Reviewed  COMPREHENSIVE METABOLIC PANEL - Abnormal; Notable for the following components:      Result Value   Chloride 93 (*)    CO2 37 (*)    Glucose, Bld 227 (*)    Creatinine, Ser 1.34 (*)    GFR calc non Af Amer 53 (*)    All other components within normal limits  BRAIN NATRIURETIC PEPTIDE - Abnormal; Notable for the following components:   B Natriuretic Peptide 202.0 (*)    All other components within normal limits  CBC WITH DIFFERENTIAL/PLATELET  TROPONIN I    EKG EKG Interpretation  Date/Time:  Saturday February 24 2018 18:15:13 EDT Ventricular Rate:  90 PR Interval:    QRS Duration: 67 QT Interval:  407 QTC Calculation: 498 R Axis:   88 Text Interpretation:  Atrial fibrillation Ventricular premature complex Borderline right axis deviation Low voltage, precordial leads Borderline T abnormalities, diffuse leads Borderline prolonged QT interval Abnormal ekg Confirmed by Carmin Muskrat [phone]) on 02/24/2018 6:31:44 PM   Radiology Dg Chest 2 View  Result Date: 02/24/2018 CLINICAL DATA:  Chronic shortness of breath.  History of asthma. EXAM: CHEST - 2 VIEW COMPARISON:  January 04, 2018 FINDINGS: The heart, hila, mediastinum, and pleura are normal. No pneumothorax. No overt edema. No acute abnormalities. IMPRESSION: No active cardiopulmonary disease. Electronically Signed   By: Dorise Bullion III M.D   On: 02/24/2018 18:57    Procedures Procedures (including critical care time)  Medications Ordered in ED Medications  albuterol (PROVENTIL,VENTOLIN) solution continuous neb (0 mg/hr Nebulization Stopped 02/24/18 2000)  methylPREDNISolone sodium succinate (SOLU-MEDROL) 125 mg/2 mL injection 125 mg (125 mg Intravenous Given  02/24/18 1850)     Initial Impression / Assessment and Plan / ED Course  I have reviewed the triage vital signs and the nursing notes.  Pertinent labs & imaging results that were available during my care of the patient were reviewed by me and considered in my medical decision making (see chart for details).     On arrival the patient had 60% oxygen saturation on room air, with ambulation. He was immediately placed on supplemental oxygen, continuous cardiac monitoring. With diminished breath sounds, concern for respiratory distress given his hypoxia, tachypnea, tachycardia, the patient was started on BiPAP, received steroids, continuous albuterol.  8:19 PM Patient is completed 1 hour continuous nebulizer therapy, has just taken off BiPAP. Oxygen level now 90% on his typical home oxygen, patient states that he feels better, is ready to go home. I reviewed all x-rays, labs with him and his wife, including absence of evidence for pneumonia. With also no evidence for ongoing coronary ischemia, nor congestive heart failure exacerbation, the patient will be discharged, with ongoing daily steroids, every 4 hour nebulizer session, outpatient follow-up. We also discussed the importance of smoking cessation.  Final Clinical Impressions(s) / ED Diagnoses  COPD exacerbation.  CRITICAL CARE Performed by: Carmin Muskrat Total critical care time: 35 minutes Critical care time was exclusive of separately billable procedures and treating other patients. Critical care was necessary to treat or prevent imminent or life-threatening deterioration. Critical care was time spent personally by me on the following activities: development of treatment plan with patient and/or surrogate as well as nursing, discussions with consultants, evaluation of patient's response to treatment, examination of patient, obtaining history from patient or surrogate, ordering and  performing treatments and interventions, ordering and  review of laboratory studies, ordering and review of radiographic studies, pulse oximetry and re-evaluation of patient's condition.    Carmin Muskrat, MD 02/24/18 2020    Carmin Muskrat, MD 02/24/18 2021

## 2018-03-11 DIAGNOSIS — J439 Emphysema, unspecified: Secondary | ICD-10-CM | POA: Diagnosis not present

## 2018-03-11 DIAGNOSIS — J9611 Chronic respiratory failure with hypoxia: Secondary | ICD-10-CM | POA: Diagnosis not present

## 2018-03-11 DIAGNOSIS — J449 Chronic obstructive pulmonary disease, unspecified: Secondary | ICD-10-CM | POA: Diagnosis not present

## 2018-03-27 IMAGING — CR DG CHEST 1V
1 series · 1 of 1 positions shown · non-contrast
Comparison: 03/28/2016

CLINICAL DATA: Shortness of Breath

EXAM:
CHEST 1 VIEW

[view not recorded]
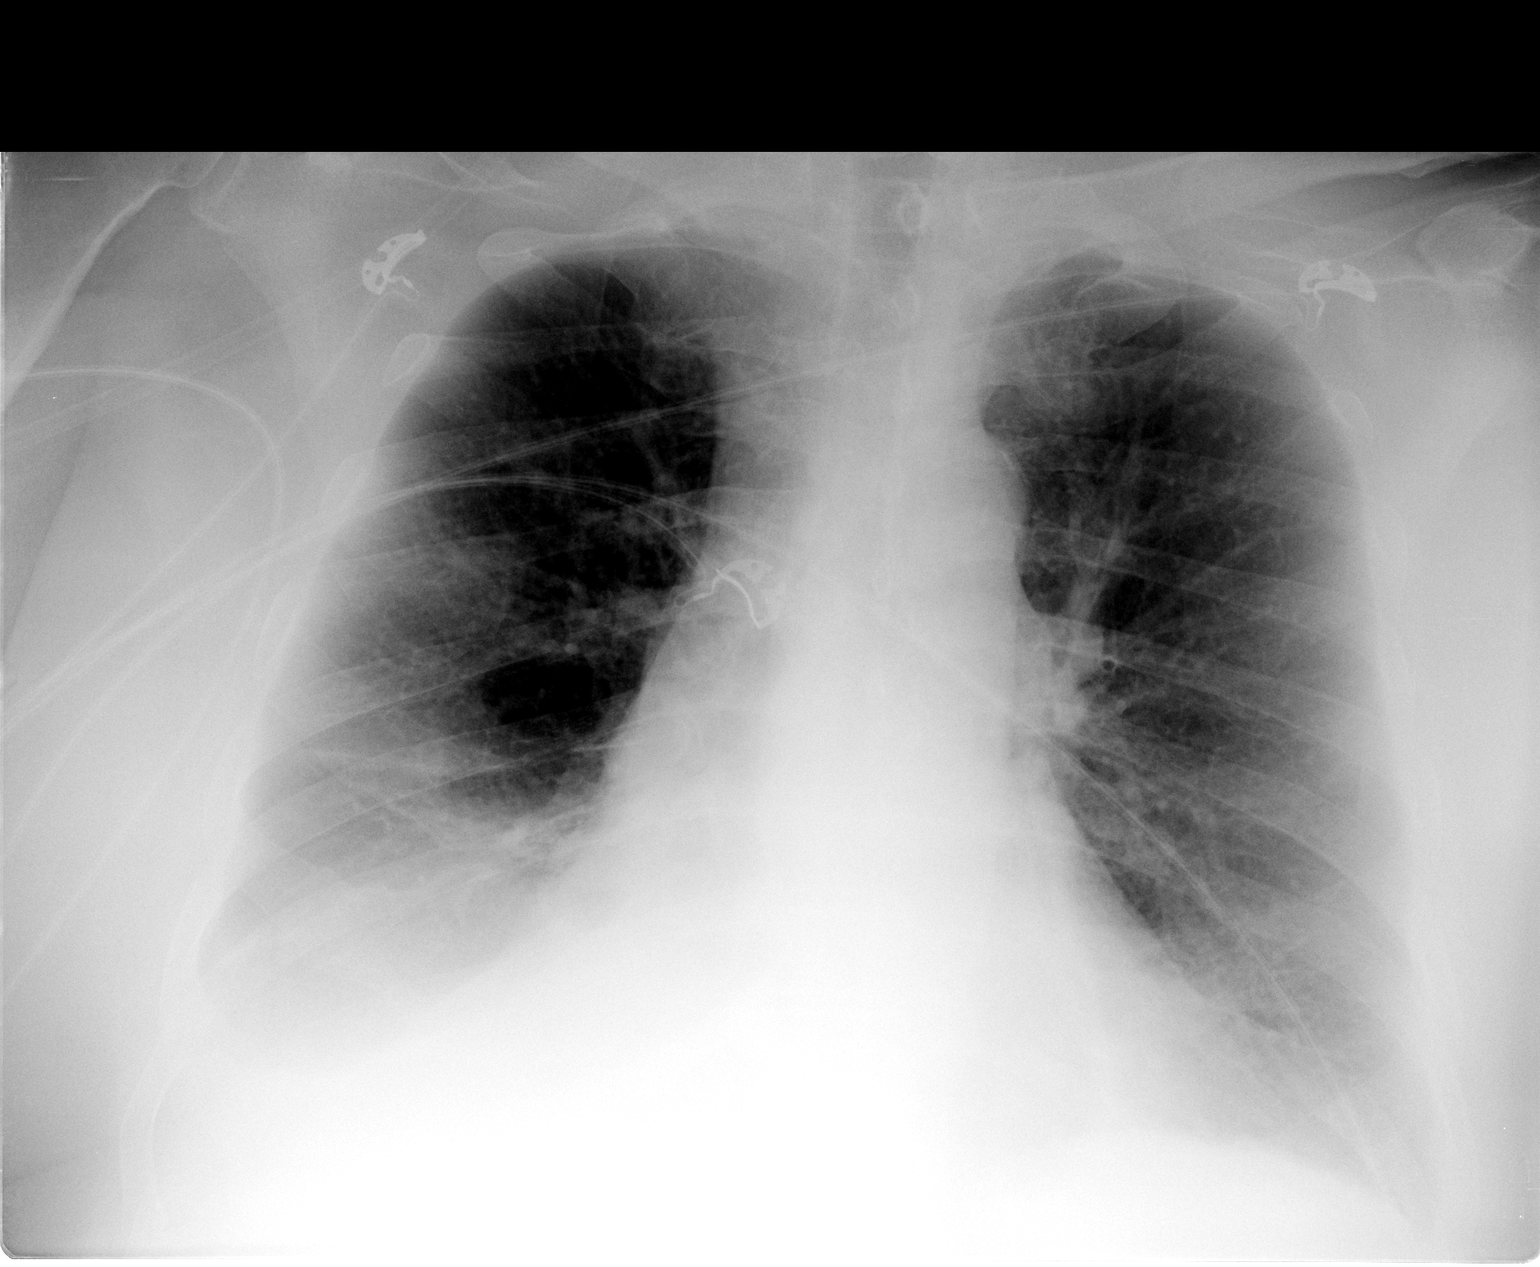

[1 of 1 positions shown; findings below may reference images not displayed]

FINDINGS: Interstitial opacities have improved since prior study. Probable
small layering effusions with bibasilar atelectasis. Heart is
borderline in size.
IMPRESSION: Improving interstitial edema. Bibasilar atelectasis and small
effusions.

## 2018-03-30 ENCOUNTER — Other Ambulatory Visit: Payer: Self-pay | Admitting: Family Medicine

## 2018-03-30 MED ORDER — IPRATROPIUM-ALBUTEROL 0.5-2.5 (3) MG/3ML IN SOLN: RESPIRATORY_TRACT | 0 refills | Status: DC

## 2018-03-30 NOTE — Addendum Note (Signed)
Addended by: Antonietta Barcelona D on: 03/30/2018 10:28 AM   Modules accepted: Orders

## 2018-03-30 NOTE — Telephone Encounter (Signed)
appt was not able to be made w/ Dr Laurance Flatten till February

## 2018-04-03 ENCOUNTER — Other Ambulatory Visit: Payer: Self-pay | Admitting: *Deleted

## 2018-04-03 MED ORDER — IPRATROPIUM-ALBUTEROL 0.5-2.5 (3) MG/3ML IN SOLN: 3.0000 mL | Freq: Four times a day (QID) | RESPIRATORY_TRACT | 0 refills | Status: DC | PRN

## 2018-04-08 ENCOUNTER — Other Ambulatory Visit: Payer: Self-pay | Admitting: Family Medicine

## 2018-04-09 NOTE — Telephone Encounter (Signed)
OV w/ [facility] 05/30/18

## 2018-04-10 DIAGNOSIS — J449 Chronic obstructive pulmonary disease, unspecified: Secondary | ICD-10-CM | POA: Diagnosis not present

## 2018-04-10 DIAGNOSIS — J439 Emphysema, unspecified: Secondary | ICD-10-CM | POA: Diagnosis not present

## 2018-04-10 DIAGNOSIS — J9611 Chronic respiratory failure with hypoxia: Secondary | ICD-10-CM | POA: Diagnosis not present

## 2018-04-11 ENCOUNTER — Ambulatory Visit (INDEPENDENT_AMBULATORY_CARE_PROVIDER_SITE_OTHER): Payer: Medicare HMO | Admitting: Pharmacist Clinician (PhC)/ Clinical Pharmacy Specialist

## 2018-04-11 DIAGNOSIS — I4891 Unspecified atrial fibrillation: Secondary | ICD-10-CM | POA: Diagnosis not present

## 2018-04-11 DIAGNOSIS — Z7901 Long term (current) use of anticoagulants: Secondary | ICD-10-CM

## 2018-04-11 LAB — COAGUCHEK XS/INR WAIVED
INR: 3.5 — ABNORMAL HIGH (ref 0.9–1.1)
Prothrombin Time: 41.5 s

## 2018-04-11 NOTE — Patient Instructions (Signed)
Description   No warfarin tomorrow then take 7.5mg  every day except for 5mg  on Thursday.  INR today is 3.5 (goal is 2-3)  A little thin today

## 2018-04-22 ENCOUNTER — Other Ambulatory Visit: Payer: Self-pay | Admitting: Family Medicine

## 2018-04-23 NOTE — Telephone Encounter (Signed)
Last seen 08/02/17

## 2018-05-09 ENCOUNTER — Encounter: Payer: Self-pay | Admitting: Pharmacist Clinician (PhC)/ Clinical Pharmacy Specialist

## 2018-05-09 ENCOUNTER — Ambulatory Visit (INDEPENDENT_AMBULATORY_CARE_PROVIDER_SITE_OTHER): Payer: Medicare Other | Admitting: Pharmacist Clinician (PhC)/ Clinical Pharmacy Specialist

## 2018-05-09 DIAGNOSIS — Z7901 Long term (current) use of anticoagulants: Secondary | ICD-10-CM

## 2018-05-09 DIAGNOSIS — I4891 Unspecified atrial fibrillation: Secondary | ICD-10-CM

## 2018-05-09 LAB — COAGUCHEK XS/INR WAIVED
INR: 1.8 — AB (ref 0.9–1.1)
Prothrombin Time: 21.5 s

## 2018-05-09 NOTE — Patient Instructions (Signed)
Description   Take 2 tablets today (10mg ) then resume regular schedule  INR today is 1.8 (goal 2-3) a little thick today

## 2018-05-11 DIAGNOSIS — J449 Chronic obstructive pulmonary disease, unspecified: Secondary | ICD-10-CM | POA: Diagnosis not present

## 2018-05-12 ENCOUNTER — Other Ambulatory Visit: Payer: Self-pay | Admitting: Family Medicine

## 2018-05-14 ENCOUNTER — Ambulatory Visit: Payer: Medicare HMO | Admitting: Internal Medicine

## 2018-05-14 NOTE — Telephone Encounter (Signed)
LAST SEEN 08/02/17 dwm

## 2018-05-15 ENCOUNTER — Telehealth: Payer: Self-pay | Admitting: Family Medicine

## 2018-05-15 ENCOUNTER — Telehealth: Payer: Self-pay | Admitting: Internal Medicine

## 2018-05-15 ENCOUNTER — Other Ambulatory Visit: Payer: Self-pay | Admitting: Family Medicine

## 2018-05-15 NOTE — Telephone Encounter (Signed)
R/s appt per 1/21 sch message - pt wife is aware of appt date and time

## 2018-05-15 NOTE — Telephone Encounter (Signed)
Aware refill sent to pharmacy ?

## 2018-05-16 ENCOUNTER — Inpatient Hospital Stay: Payer: Medicare Other | Admitting: Internal Medicine

## 2018-05-16 ENCOUNTER — Inpatient Hospital Stay: Payer: Medicare Other

## 2018-05-16 ENCOUNTER — Other Ambulatory Visit: Payer: Self-pay | Admitting: Medical Oncology

## 2018-05-16 DIAGNOSIS — C8332 Diffuse large B-cell lymphoma, intrathoracic lymph nodes: Secondary | ICD-10-CM

## 2018-05-30 ENCOUNTER — Ambulatory Visit (INDEPENDENT_AMBULATORY_CARE_PROVIDER_SITE_OTHER): Payer: Medicare Other | Admitting: Family Medicine

## 2018-05-30 ENCOUNTER — Encounter: Payer: Self-pay | Admitting: Family Medicine

## 2018-05-30 VITALS — BP 129/78 | HR 102 | Temp 97.5°F | Ht 69.0 in | Wt 284.0 lb

## 2018-05-30 DIAGNOSIS — E559 Vitamin D deficiency, unspecified: Secondary | ICD-10-CM | POA: Diagnosis not present

## 2018-05-30 DIAGNOSIS — Z794 Long term (current) use of insulin: Secondary | ICD-10-CM

## 2018-05-30 DIAGNOSIS — Z7901 Long term (current) use of anticoagulants: Secondary | ICD-10-CM

## 2018-05-30 DIAGNOSIS — C8590 Non-Hodgkin lymphoma, unspecified, unspecified site: Secondary | ICD-10-CM

## 2018-05-30 DIAGNOSIS — J9611 Chronic respiratory failure with hypoxia: Secondary | ICD-10-CM

## 2018-05-30 DIAGNOSIS — J449 Chronic obstructive pulmonary disease, unspecified: Secondary | ICD-10-CM

## 2018-05-30 DIAGNOSIS — E7800 Pure hypercholesterolemia, unspecified: Secondary | ICD-10-CM

## 2018-05-30 DIAGNOSIS — D696 Thrombocytopenia, unspecified: Secondary | ICD-10-CM

## 2018-05-30 DIAGNOSIS — N400 Enlarged prostate without lower urinary tract symptoms: Secondary | ICD-10-CM

## 2018-05-30 DIAGNOSIS — I5032 Chronic diastolic (congestive) heart failure: Secondary | ICD-10-CM

## 2018-05-30 DIAGNOSIS — I739 Peripheral vascular disease, unspecified: Secondary | ICD-10-CM

## 2018-05-30 DIAGNOSIS — I4891 Unspecified atrial fibrillation: Secondary | ICD-10-CM | POA: Diagnosis not present

## 2018-05-30 DIAGNOSIS — E119 Type 2 diabetes mellitus without complications: Secondary | ICD-10-CM | POA: Diagnosis not present

## 2018-05-30 LAB — URINALYSIS
BILIRUBIN UA: NEGATIVE
Leukocytes, UA: NEGATIVE
NITRITE UA: NEGATIVE
Specific Gravity, UA: 1.015 (ref 1.005–1.030)
Urobilinogen, Ur: 1 mg/dL (ref 0.2–1.0)
pH, UA: 7.5 (ref 5.0–7.5)

## 2018-05-30 LAB — COAGUCHEK XS/INR WAIVED
INR: 1.7 — ABNORMAL HIGH (ref 0.9–1.1)
Prothrombin Time: 20.2 s

## 2018-05-30 LAB — BAYER DCA HB A1C WAIVED: HB A1C: 11.6 % — AB (ref <7.0)

## 2018-05-30 MED ORDER — ALPRAZOLAM 0.25 MG PO TABS: 0.2500 mg | ORAL_TABLET | Freq: Three times a day (TID) | ORAL | 5 refills | Status: DC | PRN

## 2018-05-30 NOTE — Patient Instructions (Addendum)
Medicare Annual Wellness Visit  [place] and the medical providers at [facility] strive to bring you the best medical care.  In doing so we not only want to address your current medical conditions and concerns but also to detect new conditions early and prevent illness, disease and health-related problems.    Medicare offers a yearly Wellness Visit which allows our clinical staff to assess your need for preventative services including immunizations, lifestyle education, counseling to decrease risk of preventable diseases and screening for fall risk and other medical concerns.    This visit is provided free of charge (no copay) for all Medicare recipients. The clinical pharmacists at [facility] have begun to conduct these Wellness Visits which will also include a thorough review of all your medications.    As you primary medical provider recommend that you make an appointment for your Annual Wellness Visit if you have not done so already this year.  You may set up this appointment before you leave today or you may call back ([phone]) and schedule an appointment.  Please make sure when you call that you mention that you are scheduling your Annual Wellness Visit with the clinical pharmacist so that the appointment may be made for the proper length of time.     Continue current medications. Continue good therapeutic lifestyle changes which include good diet and exercise. Fall precautions discussed with patient. If an FOBT was given today- please return it to our front desk. If you are over 46 years old - you may need Prevnar 10 or the adult Pneumonia vaccine.  **Flu shots are available--- please call and schedule a FLU-CLINIC appointment**  After your visit with [place] today you will receive a survey in the mail or online from [employer] regarding your care with [place]. Please take a moment to fill this out. Your feedback is very  important to [place] as you can help [place] better understand your patient needs as well as improve your experience and satisfaction. WE CARE ABOUT YOU!!!   Continue to follow-up with oncology Continue to make every effort to lose weight through diet and exercise Check feet regularly Continue to be careful and do not put self at risk for falling Check blood sugars regularly both fasting and before meals Have pro time rechecked in 2 weeks and increase current directions of medicine to 1-1/2 pills daily until that time   Description   Take 1.5  tablets today (7.5mg )   INR today is 1.7 (goal 2-3) a little thick today

## 2018-05-30 NOTE — Progress Notes (Signed)
Subjective:    Patient ID: Justin Ruiz, male    DOB: [birth year], 68 y.o.   MRN: [id number]  HPI Pt here for follow up and management of chronic medical problems which includes a fib and diabetes. He is taking medication regularly.  The patient comes in today for a general physical exam.  He has not been seen since April 2019.  He is due to get a rectal exam lab work today and a urinalysis today.  He is requesting a refill on his Xanax.  His pulse ox was 94% on 3 L of O2.  His INR today was 1.7 and this will need to be adjusted.  He denies any chest pain.  He has no complaints.  The patient has many diagnoses.  He has COPD obstructive sleep apnea diabetes chronic respiratory failure peripheral vascular disease type 2 diabetes thrombocytopenia.  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  He is on chronic Coumadin therapy because of atrial fibrillation.  He also has morbid obesity.  The patient actually looks good for him.  He denies having any problems with chest pain or shortness of breath anymore than usual.  He denies any trouble with swallowing heartburn indigestion nausea vomiting diarrhea blood in the stool or black tarry bowel movements.  He has had no change in bowel habits.  He says he is passing his water well.  He remains morbidly obese with a BMI of 43.54.  He says that he gets his eye exam done yearly and that would be coming up soon.    Patient Active Problem List   Diagnosis Date Noted  . Chronic respiratory failure with hypoxia ([place]) 11/10/2017  . Peripheral vascular insufficiency ([place]) 08/02/2017  . Diabetic retinopathy ([place]) 05/28/2017  . Acute on chronic respiratory failure with hypoxia ([place]) 12/04/2016  . Thrombocytopenia ([place]) 11/23/2016  . Memory loss 04/13/2016  . Hypomagnesemia   . Morbid obesity ([place]) 03/29/2016  . Hypoglycemia 03/28/2016  . Acute encephalopathy 03/28/2016  . Acute diastolic heart failure  ([place]) 03/28/2016  . COPD mixed type ([place]) 03/28/2016  . Atrial fibrillation with RVR ([place]) 03/28/2016  . 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 Chronic diastolic heart failure ([place])   . CAP (community acquired pneumonia)   . Sepsis ([place]) 06/07/2015  . Diabetic neuropathy, type II diabetes mellitus ([place]) 09/25/2014  . Obstructive sleep apnea 05/25/2014  . Benign neoplasm of ascending colon 05/05/2014  . Excessive daytime sleepiness 01/24/2014  . Mediastinal adenopathy 06/20/2013  . Severe obesity (BMI >= 40) ([place]) 05/01/2013  . Generalized anxiety disorder 05/01/2013  . Lung nodules 03/11/2013  . Chronic anticoagulation 08/20/2012  . NHL (non-Hodgkin's lymphoma) ([place]) 01/26/2010  . Obesity, morbid ([place]) 04/27/2009  . CARDIOVASCULAR STUDIES, ABNORMAL 04/27/2009  . ABNORMAL STRESS ELECTROCARDIOGRAM 03/25/2009  . PERSONAL HISTORY OF COLONIC POLYPS 03/02/2009  . Diabetes mellitus type 2, insulin dependent ([place]) 04/29/2007  . Hyperlipidemia 04/29/2007  . Hypertension 04/29/2007  . Seasonal and perennial allergic rhinitis 04/29/2007  . COPD exacerbation ([place]) 04/29/2007  . G E R D 04/29/2007  . BENIGN PROSTATIC HYPERTROPHY, HX OF 04/29/2007   Outpatient Encounter Medications as of 05/30/2018  Medication Sig  . albuterol (PROVENTIL) (2.5 MG/3ML) 0.083% nebulizer solution Take 3 mLs (2.5 mg total) by nebulization every 6 (six) hours as needed for wheezing or shortness of breath.  . ALPRAZolam (XANAX) 0.25 MG tablet  TAKE 1 TABLET BY MOUTH THREE TIMES DAILY AS NEEDED FOR ANXIETY  . Blood Glucose Monitoring Suppl (CLEVER CHEK AUTO-CODE VOICE) DEVI USE AS DIRECTED  . Cholecalciferol (VITAMIN D3) 5000 UNITS TABS Take 1 tablet by mouth every morning.  . digoxin (LANOXIN) 0.25 MG tablet Take 0.25 mg by mouth daily.  . fenofibrate 160 MG tablet Take 1 tablet (160 mg total) by mouth daily.  .  furosemide (LASIX) 40 MG tablet TAKE 1 TABLET BY MOUTH TWICE DAILY AS  DIRECTED  . insulin NPH Human (NOVOLIN N RELION) 100 UNIT/ML injection Inject 0.5 mLs (50 Units total) into the skin daily before breakfast.  . insulin regular (NOVOLIN R,HUMULIN R) 100 units/mL injection Inject 0.08-0.09 mLs (8-9 Units total) into the skin 2 (two) times daily before a meal.  . ipratropium-albuterol (DUONEB) 0.5-2.5 (3) MG/3ML SOLN Take 3 mLs by nebulization every 6 (six) hours as needed.  Elmore Guise Devices (LANCING DEVICE) MISC USE AS DIRECTED  . metFORMIN (GLUCOPHAGE) 500 MG tablet TAKE 1 TABLET BY MOUTH IN THE MORNING AND 2 IN THE EVENING  . metoprolol tartrate (LOPRESSOR) 100 MG tablet Take 1 tablet (100 mg total) by mouth 2 (two) times daily.  Marland Kitchen PARoxetine (PAXIL) 20 MG tablet Take 1 tablet (20 mg total) by mouth daily.  . simvastatin (ZOCOR) 40 MG tablet Take 1 tablet (40 mg total) by mouth daily.  Marland Kitchen warfarin (COUMADIN) 5 MG tablet TAKE 1 TABLET BY MOUTH ONCE DAILY OR  AS  DIRECTED  BY  ANTICOAGULATION  CLINIC  (NOTE  CHANGE  IN  DOSE)  . oxymetazoline (AFRIN) 0.05 % nasal spray Place 1 spray into both nostrils 2 (two) times daily as needed for congestion.  . [DISCONTINUED] doxycycline (VIBRAMYCIN) 100 MG capsule Take 1 capsule (100 mg total) by mouth 2 (two) times daily.  . [DISCONTINUED] fluticasone (FLONASE) 50 MCG/ACT nasal spray Place 2 sprays into both nostrils daily.  . [DISCONTINUED] Fluticasone-Umeclidin-Vilant (TRELEGY ELLIPTA) 100-62.5-25 MCG/INH AEPB Inhale 1 puff into the lungs daily.  . [DISCONTINUED] theophylline (THEO-24) 400 MG 24 hr capsule Take 1 capsule (400 mg total) by mouth daily.   No facility-administered encounter medications on file as of 05/30/2018.      Review of Systems  Constitutional: Negative.   HENT: Negative.   Eyes: Negative.   Respiratory: Negative.   Cardiovascular: Negative.   Gastrointestinal: Negative.   Endocrine: Negative.   Genitourinary: Negative.     Musculoskeletal: Negative.   Skin: Negative.   Allergic/Immunologic: Negative.   Neurological: Negative.   Hematological: Negative.   Psychiatric/Behavioral: Negative.        Objective:   Physical Exam Vitals signs and nursing note reviewed.  Constitutional:      General: He is not in acute distress.    Appearance: He is well-developed. He is obese. He is ill-appearing. He is not diaphoretic.     Comments: The patient is pleasant and alert and responded appropriately to questions asked of him.  HENT:     Head: Normocephalic and atraumatic.     Right Ear: Tympanic membrane, ear canal and external ear normal. There is no impacted cerumen.     Left Ear: Tympanic membrane, ear canal and external ear normal. There is no impacted cerumen.     Nose: Nose normal. No congestion or rhinorrhea.     Mouth/Throat:     Mouth: Mucous membranes are moist.     Pharynx: Oropharynx is clear. No oropharyngeal exudate.  Eyes:     General: No scleral  icterus.       Right eye: No discharge.        Left eye: No discharge.     Extraocular Movements: Extraocular movements intact.     Conjunctiva/sclera: Conjunctivae normal.     Pupils: Pupils are equal, round, and reactive to light.     Comments: Continue to get eye exams regularly  Neck:     Musculoskeletal: Normal range of motion and neck supple. No neck rigidity.     Thyroid: No thyromegaly.     Vascular: No carotid bruit.     Trachea: No tracheal deviation.     Comments: No bruits or thyromegaly noted on exam.  No adenopathy Cardiovascular:     Rate and Rhythm: Normal rate. Rhythm irregular.     Pulses: Normal pulses.     Heart sounds: Normal heart sounds. No murmur. No gallop.      Comments: Heart is irregular irregular at 96/min with no edema and good pedal pulses bilaterally Pulmonary:     Effort: Pulmonary effort is normal. No respiratory distress.     Breath sounds: No wheezing or rales.     Comments: Patient has wheezes and diminished  breath sounds bilaterally.  No chest wall tenderness or masses. Chest:     Chest wall: No tenderness.  Abdominal:     General: Bowel sounds are normal.     Palpations: Abdomen is soft. There is no mass.     Tenderness: There is no abdominal tenderness. There is no guarding.     Comments: Morbid obesity without masses tenderness organ enlargement or bruits  Genitourinary:    Penis: Normal.      Scrotum/Testes: Normal.     Rectum: Normal.     Comments: The prostate was enlarged and what was felt was soft and smooth.  Due to buttock size it was hard to feel the entire prostate.  There were no lumps or masses.  There were no rectal masses.  External genitalia appear to be within normal limits. Musculoskeletal: Normal range of motion.        General: No swelling or tenderness.     Right lower leg: No edema.     Left lower leg: No edema.  Lymphadenopathy:     Cervical: No cervical adenopathy.  Skin:    General: Skin is warm and dry.     Findings: No rash.     Comments: Very dry skin all over and especially feet.  Neurological:     Mental Status: He is alert and oriented to person, place, and time. Mental status is at baseline.     Cranial Nerves: No cranial nerve deficit.     Sensory: No sensory deficit.     Deep Tendon Reflexes: Reflexes are normal and symmetric. Reflexes normal.  Psychiatric:        Mood and Affect: Mood normal.        Behavior: Behavior normal.        Thought Content: Thought content normal.        Judgment: Judgment normal.     Comments: Mood affect and behavior for this patient seem to be slightly more upbeat than usual.    BP 129/78 (BP Location: Left Arm)   Pulse (!) 102   Temp (!) 97.5 F (36.4 C) (Oral)   Ht _0  (1.753 m)   Wt 284 lb (128.8 kg)   SpO2 94% Comment: on 3 liters  BMI 41.94 kg/m         Assessment &  Plan:  1. Atrial fibrillation with RVR (HCC) -Continue Coumadin as directed and follow-up with cardiology as planned - CBC with  Differential/Platelet - CoaguChek XS/INR Waived  2. Type 2 diabetes mellitus treated with insulin ([place]) -Continue aggressive therapeutic lifestyle changes geared toward weight loss.  Check blood sugars more frequently. - BMP8+EGFR - CBC with Differential/Platelet - Bayer DCA Hb A1c Waived  3. Pure hypercholesterolemia -Continue current treatment pending results of lab work - CBC with Differential/Platelet - Lipid panel - Hepatic function panel  4. Vitamin D deficiency -Continue with vitamin D replacement pending results of lab work - CBC with Differential/Platelet - VITAMIN D 25 Hydroxy (Vit-D Deficiency, Fractures)  5. Thrombocytopenia ([place]) -No bleeding issues mentioned by patient or increased bruising noted. - CBC with Differential/Platelet  6. Benign prostatic hyperplasia, unspecified whether lower urinary tract symptoms present -Prostate exam was difficult and only the most inferior portion of the prostate was felt and this was smooth but enlarged.  No rectal masses were noted.  No complaints by patient. - PSA, total and free - Urinalysis  7. Morbid obesity ([place]) -Continue to achieve weight loss through diet and exercise  8. Chronic obstructive pulmonary disease, unspecified COPD type ([place]) -Follow-up with pulmonology as planned  9. Non-Hodgkin's lymphoma in adult [facility]) -Follow-up with oncology as planned  10. Chronic respiratory failure with hypoxia (HCC) -Continue with O2 at 3 L/min  11. Chronic diastolic heart failure ([place]) -Follow-up with cardiology as planned  12. Peripheral vascular insufficiency (HCC) -Pulses were present but diminished.  13. Diabetes mellitus type 2, insulin dependent (HCC) -Blood sugars according the patient have been running in the 230 range.  Meds ordered this encounter  Medications  . ALPRAZolam (XANAX) 0.25 MG tablet    Sig: Take 1 tablet (0.25 mg total) by mouth 3 (three) times daily as needed. for anxiety    Dispense:  90  tablet    Refill:  5   Patient Instructions                        Medicare Annual Wellness Visit  [facility] and the medical providers at [facility] strive to bring you the best medical care.  In doing so we not only want to address your current medical conditions and concerns but also to detect new conditions early and prevent illness, disease and health-related problems.    Medicare offers a yearly Wellness Visit which allows our clinical staff to assess your need for preventative services including immunizations, lifestyle education, counseling to decrease risk of preventable diseases and screening for fall risk and other medical concerns.    This visit is provided free of charge (no copay) for all Medicare recipients. The clinical pharmacists at [facility] have begun to conduct these Wellness Visits which will also include a thorough review of all your medications.    As you primary medical provider recommend that you make an appointment for your Annual Wellness Visit if you have not done so already this year.  You may set up this appointment before you leave today or you may call back ([phone]) and schedule an appointment.  Please make sure when you call that you mention that you are scheduling your Annual Wellness Visit with the clinical pharmacist so that the appointment may be made for the proper length of time.     Continue current medications. Continue good therapeutic lifestyle changes which include good diet and exercise. Fall precautions discussed with  patient. If an FOBT was given today- please return it to our front desk. If you are over 77 years old - you may need Prevnar 20 or the adult Pneumonia vaccine.  **Flu shots are available--- please call and schedule a FLU-CLINIC appointment**  After your visit with [place] today you will receive a survey in the mail or online from [employer] regarding your care with [place]. Please take a  moment to fill this out. Your feedback is very important to [place] as you can help [place] better understand your patient needs as well as improve your experience and satisfaction. WE CARE ABOUT YOU!!!   Continue to follow-up with oncology Continue to make every effort to lose weight through diet and exercise Check feet regularly Continue to be careful and do not put self at risk for falling Check blood sugars regularly both fasting and before meals Have pro time rechecked in 2 weeks and increase current directions of medicine to 1-1/2 pills daily until that time   Description   Take 1.5  tablets today (7.51m)   INR today is 1.7 (goal 2-3) a little thick today      DArrie SenateMD

## 2018-05-31 LAB — LIPID PANEL
CHOLESTEROL TOTAL: 177 mg/dL (ref 100–199)
Chol/HDL Ratio: 4.4 ratio (ref 0.0–5.0)
HDL: 40 mg/dL (ref >39)
LDL CALC: 72 mg/dL (ref 0–99)
TRIGLYCERIDES: 327 mg/dL — AB (ref 0–149)
VLDL CHOLESTEROL CAL: 65 mg/dL — AB (ref 5–40)

## 2018-05-31 LAB — BMP8+EGFR
BUN/Creatinine Ratio: 12 (ref 10–24)
BUN: 12 mg/dL (ref 8–27)
CALCIUM: 9.3 mg/dL (ref 8.6–10.2)
CHLORIDE: 94 mmol/L — AB (ref 96–106)
CO2: 27 mmol/L (ref 20–29)
Creatinine, Ser: 1 mg/dL (ref 0.76–1.27)
GFR calc non Af Amer: 78 mL/min/{1.73_m2} (ref >59)
GFR, EST AFRICAN AMERICAN: 90 mL/min/{1.73_m2} (ref >59)
Glucose: 385 mg/dL — ABNORMAL HIGH (ref 65–99)
Potassium: 5.2 mmol/L (ref 3.5–5.2)
Sodium: 137 mmol/L (ref 134–144)

## 2018-05-31 LAB — CBC WITH DIFFERENTIAL/PLATELET
BASOS ABS: 0 10*3/uL (ref 0.0–0.2)
Basos: 1 %
EOS (ABSOLUTE): 0 10*3/uL (ref 0.0–0.4)
Eos: 1 %
HEMOGLOBIN: 13.9 g/dL (ref 13.0–17.7)
Hematocrit: 41.4 % (ref 37.5–51.0)
IMMATURE GRANS (ABS): 0 10*3/uL (ref 0.0–0.1)
IMMATURE GRANULOCYTES: 0 %
Lymphocytes Absolute: 0.8 10*3/uL (ref 0.7–3.1)
Lymphs: 13 %
MCH: 30.4 pg (ref 26.6–33.0)
MCHC: 33.6 g/dL (ref 31.5–35.7)
MCV: 91 fL (ref 79–97)
MONOCYTES: 6 %
Monocytes Absolute: 0.4 10*3/uL (ref 0.1–0.9)
NEUTROS PCT: 79 %
Neutrophils Absolute: 4.8 10*3/uL (ref 1.4–7.0)
PLATELETS: 156 10*3/uL (ref 150–450)
RBC: 4.57 x10E6/uL (ref 4.14–5.80)
RDW: 12.6 % (ref 11.6–15.4)
WBC: 6 10*3/uL (ref 3.4–10.8)

## 2018-05-31 LAB — HEPATIC FUNCTION PANEL
ALK PHOS: 88 IU/L (ref 39–117)
ALT: 11 IU/L (ref 0–44)
AST: 14 IU/L (ref 0–40)
Albumin: 3.9 g/dL (ref 3.8–4.8)
BILIRUBIN TOTAL: 1 mg/dL (ref 0.0–1.2)
BILIRUBIN, DIRECT: 0.31 mg/dL (ref 0.00–0.40)
Total Protein: 6.6 g/dL (ref 6.0–8.5)

## 2018-05-31 LAB — VITAMIN D 25 HYDROXY (VIT D DEFICIENCY, FRACTURES): Vit D, 25-Hydroxy: 20.6 ng/mL — ABNORMAL LOW (ref 30.0–100.0)

## 2018-05-31 LAB — PSA, TOTAL AND FREE
PROSTATE SPECIFIC AG, SERUM: 1.3 ng/mL (ref 0.0–4.0)
PSA FREE: 0.4 ng/mL
PSA, Free Pct: 30.8 %

## 2018-06-11 DIAGNOSIS — J449 Chronic obstructive pulmonary disease, unspecified: Secondary | ICD-10-CM | POA: Diagnosis not present

## 2018-06-13 ENCOUNTER — Ambulatory Visit: Payer: Medicare Other | Admitting: Pharmacist Clinician (PhC)/ Clinical Pharmacy Specialist

## 2018-06-13 DIAGNOSIS — I4891 Unspecified atrial fibrillation: Secondary | ICD-10-CM

## 2018-06-13 DIAGNOSIS — Z7901 Long term (current) use of anticoagulants: Secondary | ICD-10-CM | POA: Diagnosis not present

## 2018-06-13 LAB — COAGUCHEK XS/INR WAIVED
INR: 5.7 (ref 0.9–1.1)
Prothrombin Time: 69 s

## 2018-06-13 NOTE — Patient Instructions (Signed)
Description   No warfarin today and tomorrow. Then re-check on Friday before re-starting warfarin  INR today is 5.7 today (goal is 2-3).  Too thin today

## 2018-06-15 ENCOUNTER — Ambulatory Visit (INDEPENDENT_AMBULATORY_CARE_PROVIDER_SITE_OTHER): Payer: Medicare Other | Admitting: Family Medicine

## 2018-06-15 ENCOUNTER — Encounter: Payer: Self-pay | Admitting: Family Medicine

## 2018-06-15 VITALS — BP 120/77 | HR 87 | Temp 97.5°F | Ht 69.0 in | Wt 277.0 lb

## 2018-06-15 DIAGNOSIS — Z79899 Other long term (current) drug therapy: Secondary | ICD-10-CM

## 2018-06-15 DIAGNOSIS — J449 Chronic obstructive pulmonary disease, unspecified: Secondary | ICD-10-CM

## 2018-06-15 DIAGNOSIS — R2681 Unsteadiness on feet: Secondary | ICD-10-CM

## 2018-06-15 DIAGNOSIS — R0602 Shortness of breath: Secondary | ICD-10-CM

## 2018-06-15 DIAGNOSIS — I4891 Unspecified atrial fibrillation: Secondary | ICD-10-CM

## 2018-06-15 DIAGNOSIS — Z7901 Long term (current) use of anticoagulants: Secondary | ICD-10-CM | POA: Diagnosis not present

## 2018-06-15 DIAGNOSIS — R531 Weakness: Secondary | ICD-10-CM

## 2018-06-15 LAB — COAGUCHEK XS/INR WAIVED
INR: 4.5 — AB (ref 0.9–1.1)
PROTHROMBIN TIME: 54.3 s

## 2018-06-15 NOTE — Progress Notes (Addendum)
Patient ID: Justin Ruiz, male   DOB: Jan 20, 1951, 68 y.o.   MRN: [id number]  BP 120/77 (BP Location: Left Arm)   Pulse 87   Temp (!) 97.5 F (36.4 C) (Oral)   Ht 5\' 9"  (1.753 m)   Wt 277 lb (125.6 kg)   SpO2 92% Comment: on 3 liter oxygen  BMI 40.91 kg/m    Patient here today for face to face and anticoagulation visit. He was seen [facility] 06/13/18 with Sharyn Lull and INR was 5.7. She told pt to HOLD warfarin WED and Thurs and repeat INR today. Today his level is 4.5, still too thin. We will hold today 2/21 and tomorrow 2/22 and resume 5 mg daily until follow up with Sharyn Lull on [facility] 2/26.  The patient's wife came with him to the visit today also.  He lives independently of her and she is concerned about his ongoing and daily care and I would agree with that.  As a result of that conversation we are arranging for home health to come in and evaluate him. He has gait instability, general weakness, SOB and wears oxygen around the clock. He needs some physical therapy and possiblly some nursing care for med review and compliance. There was also some discussion about starting the patient on Xarelto because of the inability for him to get to the office as often as has been necessary recently.  That discussion will be had with the clinical pharmacist on Wednesday.  Also we did discuss with the patient's wife about looking at some kind of care facility that he could be admitted to indefinitely to help him manage his diet weights and medicines.  The patient's wife will look into that.  We are thinking about assisted living.

## 2018-06-15 NOTE — Patient Instructions (Signed)
Description   No warfarin today and tomorrow. (fri and sat) on Sunday resume 5 mg daily and see Sharyn Lull WED Take precautions against falls or head injuries Call office or 911 if any bleeding occurs Eat a high vitamin K food today and tomorrow  INR today is 4.5 today (goal is 2-3).  Too thin today

## 2018-06-16 NOTE — Addendum Note (Signed)
Addended by: Zannie Cove on: 06/16/2018 08:17 AM   Modules accepted: Orders

## 2018-06-20 ENCOUNTER — Ambulatory Visit (INDEPENDENT_AMBULATORY_CARE_PROVIDER_SITE_OTHER): Payer: Medicare Other | Admitting: Family Medicine

## 2018-06-20 ENCOUNTER — Encounter: Payer: Self-pay | Admitting: Family Medicine

## 2018-06-20 DIAGNOSIS — Z7901 Long term (current) use of anticoagulants: Secondary | ICD-10-CM

## 2018-06-20 DIAGNOSIS — I4891 Unspecified atrial fibrillation: Secondary | ICD-10-CM | POA: Diagnosis not present

## 2018-06-20 LAB — COAGUCHEK XS/INR WAIVED
INR: 1.7 — ABNORMAL HIGH (ref 0.9–1.1)
Prothrombin Time: 20.8 s

## 2018-06-20 MED ORDER — APIXABAN 5 MG PO TABS: 5.0000 mg | ORAL_TABLET | Freq: Two times a day (BID) | ORAL | 3 refills | Status: DC

## 2018-06-20 NOTE — Progress Notes (Signed)
Pt had anticoag visit today and protime was reviewed by Dr Laurance Flatten and Jovita Gamma D.  It was decided today that pt will Discontinue Warfarin and start Eliquis 5 mg BID. InR today was 1.7. Medication sent to [facility]. Pt will start eliquis tonight.   Home health has been contacted and they will arrange to see patient this week to get him more motivated to walk and exercise regularly.  They will call his wife before they visit.   Arrie Senate MD

## 2018-06-20 NOTE — Patient Instructions (Signed)
Description   Pt to start eliquis 5 mg BID   INR today is 1.7 today (goal is 2-3).

## 2018-06-21 ENCOUNTER — Inpatient Hospital Stay: Payer: Medicare Other | Attending: Internal Medicine

## 2018-06-21 ENCOUNTER — Inpatient Hospital Stay: Payer: Medicare Other | Admitting: Internal Medicine

## 2018-06-21 ENCOUNTER — Encounter: Payer: Self-pay | Admitting: Internal Medicine

## 2018-06-21 ENCOUNTER — Telehealth: Payer: Self-pay | Admitting: Internal Medicine

## 2018-06-21 VITALS — BP 123/61 | HR 53 | Temp 98.1°F | Resp 18 | Ht 69.0 in | Wt 280.6 lb

## 2018-06-21 DIAGNOSIS — M1990 Unspecified osteoarthritis, unspecified site: Secondary | ICD-10-CM | POA: Diagnosis not present

## 2018-06-21 DIAGNOSIS — I110 Hypertensive heart disease with heart failure: Secondary | ICD-10-CM | POA: Diagnosis not present

## 2018-06-21 DIAGNOSIS — Z9221 Personal history of antineoplastic chemotherapy: Secondary | ICD-10-CM | POA: Diagnosis not present

## 2018-06-21 DIAGNOSIS — E669 Obesity, unspecified: Secondary | ICD-10-CM | POA: Diagnosis not present

## 2018-06-21 DIAGNOSIS — N400 Enlarged prostate without lower urinary tract symptoms: Secondary | ICD-10-CM | POA: Diagnosis not present

## 2018-06-21 DIAGNOSIS — J449 Chronic obstructive pulmonary disease, unspecified: Secondary | ICD-10-CM | POA: Insufficient documentation

## 2018-06-21 DIAGNOSIS — Z7901 Long term (current) use of anticoagulants: Secondary | ICD-10-CM | POA: Insufficient documentation

## 2018-06-21 DIAGNOSIS — E11319 Type 2 diabetes mellitus with unspecified diabetic retinopathy without macular edema: Secondary | ICD-10-CM | POA: Diagnosis not present

## 2018-06-21 DIAGNOSIS — Z79899 Other long term (current) drug therapy: Secondary | ICD-10-CM | POA: Insufficient documentation

## 2018-06-21 DIAGNOSIS — J441 Chronic obstructive pulmonary disease with (acute) exacerbation: Secondary | ICD-10-CM | POA: Diagnosis not present

## 2018-06-21 DIAGNOSIS — E785 Hyperlipidemia, unspecified: Secondary | ICD-10-CM | POA: Insufficient documentation

## 2018-06-21 DIAGNOSIS — K219 Gastro-esophageal reflux disease without esophagitis: Secondary | ICD-10-CM | POA: Diagnosis not present

## 2018-06-21 DIAGNOSIS — Z794 Long term (current) use of insulin: Secondary | ICD-10-CM | POA: Diagnosis not present

## 2018-06-21 DIAGNOSIS — G4733 Obstructive sleep apnea (adult) (pediatric): Secondary | ICD-10-CM | POA: Diagnosis not present

## 2018-06-21 DIAGNOSIS — C8332 Diffuse large B-cell lymphoma, intrathoracic lymph nodes: Secondary | ICD-10-CM

## 2018-06-21 DIAGNOSIS — Z8572 Personal history of non-Hodgkin lymphomas: Secondary | ICD-10-CM | POA: Diagnosis not present

## 2018-06-21 DIAGNOSIS — I4891 Unspecified atrial fibrillation: Secondary | ICD-10-CM | POA: Insufficient documentation

## 2018-06-21 LAB — CMP (CANCER CENTER ONLY)
ALK PHOS: 90 U/L (ref 38–126)
ALT: 9 U/L (ref 0–44)
AST: 14 U/L — AB (ref 15–41)
Albumin: 3.3 g/dL — ABNORMAL LOW (ref 3.5–5.0)
Anion gap: 11 (ref 5–15)
BILIRUBIN TOTAL: 0.9 mg/dL (ref 0.3–1.2)
BUN: 28 mg/dL — ABNORMAL HIGH (ref 8–23)
CALCIUM: 9.4 mg/dL (ref 8.9–10.3)
CO2: 35 mmol/L — ABNORMAL HIGH (ref 22–32)
Chloride: 93 mmol/L — ABNORMAL LOW (ref 98–111)
Creatinine: 1.27 mg/dL — ABNORMAL HIGH (ref 0.61–1.24)
GFR, EST NON AFRICAN AMERICAN: 58 mL/min — AB (ref >60)
GLUCOSE: 357 mg/dL — AB (ref 70–99)
Potassium: 4.3 mmol/L (ref 3.5–5.1)
Sodium: 139 mmol/L (ref 135–145)
TOTAL PROTEIN: 7.3 g/dL (ref 6.5–8.1)

## 2018-06-21 LAB — CBC WITH DIFFERENTIAL (CANCER CENTER ONLY)
ABS IMMATURE GRANULOCYTES: 0.02 10*3/uL (ref 0.00–0.07)
Basophils Absolute: 0 10*3/uL (ref 0.0–0.1)
Basophils Relative: 1 %
Eosinophils Absolute: 0.1 10*3/uL (ref 0.0–0.5)
Eosinophils Relative: 1 %
HEMATOCRIT: 43.9 % (ref 39.0–52.0)
HEMOGLOBIN: 14.1 g/dL (ref 13.0–17.0)
IMMATURE GRANULOCYTES: 0 %
LYMPHS ABS: 1.2 10*3/uL (ref 0.7–4.0)
Lymphocytes Relative: 17 %
MCH: 30 pg (ref 26.0–34.0)
MCHC: 32.1 g/dL (ref 30.0–36.0)
MCV: 93.4 fL (ref 80.0–100.0)
MONOS PCT: 6 %
Monocytes Absolute: 0.4 10*3/uL (ref 0.1–1.0)
NEUTROS PCT: 75 %
Neutro Abs: 5.1 10*3/uL (ref 1.7–7.7)
Platelet Count: 158 10*3/uL (ref 150–400)
RBC: 4.7 MIL/uL (ref 4.22–5.81)
RDW: 12.9 % (ref 11.5–15.5)
WBC Count: 6.9 10*3/uL (ref 4.0–10.5)
nRBC: 0 % (ref 0.0–0.2)

## 2018-06-21 LAB — LACTATE DEHYDROGENASE: LDH: 146 U/L (ref 98–192)

## 2018-06-21 NOTE — Addendum Note (Signed)
Addended by: Ardeen Garland on: 06/21/2018 11:06 AM   Modules accepted: Orders

## 2018-06-21 NOTE — Progress Notes (Signed)
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 2) hypermetabolic mediastinal lymphadenopathy. 3) monoclonal gammopathy of undetermined significance  PRIOR THERAPY: Status post systemic chemotherapy with 6 cycles of infusional CHOPE with Rituxan.  CURRENT THERAPY: Observation  INTERVAL HISTORY: Justin Ruiz 68 y.o. male returns to the clinic today for follow-up visit accompanied by his wife.  The patient is feeling fine today with no concerning complaints except for the baseline shortness of breath and he is currently on home oxygen secondary to COPD.  He also has fatigue.  He denied having any chest pain, cough or hemoptysis.  He denied having any fever or chills.  He has no nausea, vomiting, diarrhea or constipation.  He intentionally lost few pounds since his last visit.  The patient is here today for evaluation and repeat blood work.   MEDICAL HISTORY: Past Medical History:  Diagnosis Date  . Acute diastolic heart failure ([facility]) 03/28/2016  . Acute encephalopathy 03/28/2016  . Acute on chronic respiratory failure with hypoxia ([facility]) 12/04/2016  . Arthritis   . Asthma   . Atrial fibrillation ([facility])    on AC  . Benign neoplasm of ascending colon 05/05/2014  . BPH (benign prostatic hypertrophy)   . Chronic anticoagulation 08/20/2012  . Chronic respiratory failure with hypoxia ([facility]) 11/10/2017  . Colon polyps   . COPD mixed type ([facility]) 03/28/2016   PFT 06/17/14- severe obstructive airways disease with slight response to bronchodilator. FVC 2.03/42%, FEV1 1.02/28%, ratio 0.5, TLC 82%, DLCO 59%  . Diabetes mellitus   . Diabetic neuropathy, type II diabetes mellitus ([facility]) 09/25/2014  . Diabetic retinopathy ([facility]) 05/28/2017  . 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 GERD (gastroesophageal reflux disease)   . HTN (hypertension)    x 3 years  . Hyperlipidemia   . Lung nodules 03/11/2013  . Mediastinal adenopathy 06/20/2013   CT  & PET 2/15   . Memory loss   . 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 NHL (non-Hodgkin's lymphoma) ([facility])    nhl dx 9/11  . Obesity    exogenous  . Obstructive sleep apnea 05/25/2014   NPSG-  04/07/2014-severe obstructive sleep apnea-AHI 75.1 per hour. CPAP titrated to 16. He required supplemental oxygen at 2 L/ [employer] which he already wears for sleep at home. Weight 338 pounds   . Peripheral vascular insufficiency ([facility]) 08/02/2017  . Shingles   . Sleep apnea    CPAP machine is broken  . Thrombocytopenia ([facility]) 11/23/2016    ALLERGIES:  is allergic to avapro [irbesartan] and lipitor [atorvastatin calcium].  MEDICATIONS:  Current Outpatient Medications  Medication Sig Dispense Refill  . albuterol (PROVENTIL) (2.5 MG/3ML) 0.083% nebulizer solution Take 3 mLs (2.5 mg total) by nebulization every 6 (six) hours as needed for wheezing or shortness of breath. 75 mL 12  . ALPRAZolam (XANAX) 0.25 MG tablet Take 1 tablet (0.25 mg total) by mouth 3 (three) times daily as needed. for anxiety 90 tablet 5  . apixaban (ELIQUIS) 5 MG TABS tablet Take 1 tablet (5 mg total) by mouth 2 (two) times daily. 60 tablet 3  . Blood Glucose Monitoring Suppl (CLEVER CHEK AUTO-CODE VOICE) DEVI USE AS DIRECTED 1 each 2  . Cholecalciferol (VITAMIN D3) 5000 UNITS TABS Take 1  tablet by mouth every morning.    . digoxin (LANOXIN) 0.25 MG tablet Take 0.25 mg by mouth daily.    . furosemide (LASIX) 40 MG tablet TAKE 1 TABLET BY MOUTH TWICE DAILY AS  DIRECTED 180 tablet 0  . insulin NPH Human (NOVOLIN N RELION) 100 UNIT/ML injection Inject 0.5 mLs (50 Units total) into the skin daily before breakfast. 50 mL 0  . insulin regular (NOVOLIN R,HUMULIN R) 100 units/mL injection Inject 0.08-0.09 mLs (8-9 Units total) into the  skin 2 (two) times daily before a meal. 20 mL 0  . ipratropium-albuterol (DUONEB) 0.5-2.5 (3) MG/3ML SOLN Take 3 mLs by nebulization every 6 (six) hours as needed. 1080 mL 0  . Lancet Devices (LANCING DEVICE) MISC USE AS DIRECTED 1 each 2  . metFORMIN (GLUCOPHAGE) 500 MG tablet TAKE 1 TABLET BY MOUTH IN THE MORNING AND 2 IN THE EVENING 270 tablet 3  . metoprolol tartrate (LOPRESSOR) 100 MG tablet Take 1 tablet (100 mg total) by mouth 2 (two) times daily. 180 tablet 1  . oxymetazoline (AFRIN) 0.05 % nasal spray Place 1 spray into both nostrils 2 (two) times daily as needed for congestion.    Marland Kitchen PARoxetine (PAXIL) 20 MG tablet Take 1 tablet (20 mg total) by mouth daily. 90 tablet 0  . simvastatin (ZOCOR) 40 MG tablet Take 1 tablet (40 mg total) by mouth daily. 90 tablet 1   No current facility-administered medications for this visit.     SURGICAL HISTORY:  Past Surgical History:  Procedure Laterality Date  . APPENDECTOMY    . COLONOSCOPY WITH PROPOFOL N/A 05/05/2014   Procedure: COLONOSCOPY WITH PROPOFOL;  Surgeon: Inda Castle, MD;  Location: [facility];  Service: Endoscopy;  Laterality: N/A;    REVIEW OF SYSTEMS:  A comprehensive review of systems was negative except for: Constitutional: positive for fatigue Respiratory: positive for dyspnea on exertion   PHYSICAL EXAMINATION: General appearance: alert, cooperative and no distress Head: Normocephalic, without obvious abnormality, atraumatic Neck: no adenopathy, no JVD, supple, symmetrical, trachea midline and thyroid not enlarged, symmetric, no tenderness/mass/nodules Lymph nodes: Cervical, supraclavicular, and axillary nodes normal. Resp: clear to auscultation bilaterally Back: symmetric, no curvature. ROM normal. No CVA tenderness. Cardio: regular rate and rhythm, S1, S2 normal, no murmur, click, rub or gallop GI: soft, non-tender; bowel sounds normal; no masses,  no organomegaly Extremities: extremities normal, atraumatic, no  cyanosis or edema  ECOG PERFORMANCE STATUS: 1 - Symptomatic but completely ambulatory  Blood pressure 123/61, pulse (!) 53, temperature 98.1 F (36.7 C), temperature source Oral, resp. rate 18, height 5\' 9"  (1.753 m), weight 280 lb 9.6 oz (127.3 kg), SpO2 96 %.  LABORATORY DATA: Lab Results  Component Value Date   WBC 6.9 06/21/2018   HGB 14.1 06/21/2018   HCT 43.9 06/21/2018   MCV 93.4 06/21/2018   PLT 158 06/21/2018      Chemistry      Component Value Date/Time   NA 137 05/30/2018 1047   NA 139 05/19/2016 0933   K 5.2 05/30/2018 1047   K 4.0 05/19/2016 0933   CL 94 (L) 05/30/2018 1047   CL 100 09/03/2012 0751   CO2 27 05/30/2018 1047   CO2 32 (H) 05/19/2016 0933   BUN 12 05/30/2018 1047   BUN 25.3 05/19/2016 0933   CREATININE 1.00 05/30/2018 1047   CREATININE 1.11 11/14/2017 1008   CREATININE 1.2 05/19/2016 0933   GLU 185 03/29/2012      Component Value Date/Time   CALCIUM  9.3 05/30/2018 1047   CALCIUM 10.4 05/19/2016 0933   ALKPHOS 88 05/30/2018 1047   ALKPHOS 90 05/19/2016 0933   AST 14 05/30/2018 1047   AST 13 (L) 11/14/2017 1008   AST 19 05/19/2016 0933   ALT 11 05/30/2018 1047   ALT 12 11/14/2017 1008   ALT 14 05/19/2016 0933   BILITOT 1.0 05/30/2018 1047   BILITOT 1.2 11/14/2017 1008   BILITOT 1.21 (H) 05/19/2016 0933       RADIOGRAPHIC STUDIES: No results found.  ASSESSMENT AND PLAN:   this is a very pleasant 68 years old white male with history of high-grade B-cell non-Hodgkin lymphoma , suspicious for Burkitt's lymphoma but was negative cytogenetics for 8;14 abnormality. The patient is status post 6 cycles of systemic chemotherapy with CHOP/Rituxan completed in 2012. The patient is currently on observation and he is feeling fine.  CBC today is unremarkable.  Comprehensive metabolic panel and LDH are still pending. I recommended for him to continue on observation with follow-up visit in 1 year. For the COPD, he is followed by Dr. Annamaria Boots. He was  advised to call immediately if he has any concerning symptoms in the interval. The patient voices understanding of current disease status and treatment options and is in agreement with the current care plan. All questions were answered. The patient knows to call the clinic with any problems, questions or concerns. We can certainly see the patient much sooner if necessary. I spent 10 minutes counseling the patient face to face. The total time spent in the appointment was 15 minutes. Disclaimer: This note was dictated with voice recognition software. Similar sounding words can inadvertently be transcribed and may not be corrected upon review.

## 2018-06-21 NOTE — Telephone Encounter (Signed)
Scheduled appt per 2/27 los. ° °Printed calendar and avs. °

## 2018-06-26 ENCOUNTER — Other Ambulatory Visit: Payer: Self-pay | Admitting: Family Medicine

## 2018-06-27 DIAGNOSIS — E1151 Type 2 diabetes mellitus with diabetic peripheral angiopathy without gangrene: Secondary | ICD-10-CM | POA: Diagnosis not present

## 2018-06-27 DIAGNOSIS — I110 Hypertensive heart disease with heart failure: Secondary | ICD-10-CM | POA: Diagnosis not present

## 2018-06-27 DIAGNOSIS — I5033 Acute on chronic diastolic (congestive) heart failure: Secondary | ICD-10-CM | POA: Diagnosis not present

## 2018-06-27 DIAGNOSIS — J441 Chronic obstructive pulmonary disease with (acute) exacerbation: Secondary | ICD-10-CM | POA: Diagnosis not present

## 2018-06-27 DIAGNOSIS — J9621 Acute and chronic respiratory failure with hypoxia: Secondary | ICD-10-CM | POA: Diagnosis not present

## 2018-06-28 ENCOUNTER — Other Ambulatory Visit: Payer: Self-pay | Admitting: Family Medicine

## 2018-07-04 DIAGNOSIS — I110 Hypertensive heart disease with heart failure: Secondary | ICD-10-CM | POA: Diagnosis not present

## 2018-07-04 DIAGNOSIS — J441 Chronic obstructive pulmonary disease with (acute) exacerbation: Secondary | ICD-10-CM | POA: Diagnosis not present

## 2018-07-04 DIAGNOSIS — E1151 Type 2 diabetes mellitus with diabetic peripheral angiopathy without gangrene: Secondary | ICD-10-CM | POA: Diagnosis not present

## 2018-07-04 DIAGNOSIS — J9621 Acute and chronic respiratory failure with hypoxia: Secondary | ICD-10-CM | POA: Diagnosis not present

## 2018-07-04 DIAGNOSIS — I5033 Acute on chronic diastolic (congestive) heart failure: Secondary | ICD-10-CM | POA: Diagnosis not present

## 2018-07-10 DIAGNOSIS — J449 Chronic obstructive pulmonary disease, unspecified: Secondary | ICD-10-CM | POA: Diagnosis not present

## 2018-07-11 ENCOUNTER — Ambulatory Visit (INDEPENDENT_AMBULATORY_CARE_PROVIDER_SITE_OTHER): Payer: Medicare Other

## 2018-07-11 ENCOUNTER — Other Ambulatory Visit: Payer: Self-pay

## 2018-07-11 DIAGNOSIS — E785 Hyperlipidemia, unspecified: Secondary | ICD-10-CM

## 2018-07-11 DIAGNOSIS — I5033 Acute on chronic diastolic (congestive) heart failure: Secondary | ICD-10-CM

## 2018-07-11 DIAGNOSIS — R918 Other nonspecific abnormal finding of lung field: Secondary | ICD-10-CM

## 2018-07-11 DIAGNOSIS — J9621 Acute and chronic respiratory failure with hypoxia: Secondary | ICD-10-CM

## 2018-07-11 DIAGNOSIS — E1140 Type 2 diabetes mellitus with diabetic neuropathy, unspecified: Secondary | ICD-10-CM

## 2018-07-11 DIAGNOSIS — E1151 Type 2 diabetes mellitus with diabetic peripheral angiopathy without gangrene: Secondary | ICD-10-CM

## 2018-07-11 DIAGNOSIS — J302 Other seasonal allergic rhinitis: Secondary | ICD-10-CM

## 2018-07-11 DIAGNOSIS — I110 Hypertensive heart disease with heart failure: Secondary | ICD-10-CM

## 2018-07-11 DIAGNOSIS — J441 Chronic obstructive pulmonary disease with (acute) exacerbation: Secondary | ICD-10-CM | POA: Diagnosis not present

## 2018-07-11 DIAGNOSIS — E11319 Type 2 diabetes mellitus with unspecified diabetic retinopathy without macular edema: Secondary | ICD-10-CM

## 2018-07-11 DIAGNOSIS — Z7901 Long term (current) use of anticoagulants: Secondary | ICD-10-CM

## 2018-07-11 DIAGNOSIS — Z6841 Body Mass Index (BMI) 40.0 and over, adult: Secondary | ICD-10-CM

## 2018-07-11 DIAGNOSIS — R413 Other amnesia: Secondary | ICD-10-CM

## 2018-07-11 DIAGNOSIS — I4891 Unspecified atrial fibrillation: Secondary | ICD-10-CM

## 2018-07-11 DIAGNOSIS — J3089 Other allergic rhinitis: Secondary | ICD-10-CM

## 2018-07-11 DIAGNOSIS — F411 Generalized anxiety disorder: Secondary | ICD-10-CM

## 2018-07-26 ENCOUNTER — Ambulatory Visit: Payer: Medicare Other | Admitting: Internal Medicine

## 2018-08-10 DIAGNOSIS — J449 Chronic obstructive pulmonary disease, unspecified: Secondary | ICD-10-CM | POA: Diagnosis not present

## 2018-08-23 ENCOUNTER — Telehealth: Payer: Self-pay | Admitting: Internal Medicine

## 2018-08-23 MED ORDER — IPRATROPIUM-ALBUTEROL 0.5-2.5 (3) MG/3ML IN SOLN: 3.0000 mL | Freq: Four times a day (QID) | RESPIRATORY_TRACT | 3 refills | Status: DC | PRN

## 2018-08-23 MED ORDER — ALBUTEROL SULFATE HFA 108 (90 BASE) MCG/ACT IN AERS: 2.0000 | INHALATION_SPRAY | Freq: Four times a day (QID) | RESPIRATORY_TRACT | 6 refills | Status: DC | PRN

## 2018-08-23 NOTE — Telephone Encounter (Signed)
Called and spoke with pt's wife [name] to clarify which meds pt was needing a refill of and when speaking with [name], she stated he was needing a refill of the duoneb neb sol as well as rescue inhaler. I have sent both meds to pt's preferred pharmacy. Nothing further needed.

## 2018-08-24 ENCOUNTER — Telehealth: Payer: Self-pay | Admitting: Family Medicine

## 2018-08-24 NOTE — Telephone Encounter (Signed)
Aware you are out of the office until [facility]

## 2018-08-24 NOTE — Telephone Encounter (Signed)
Is the patient's level of care needs greater meaning assisted living?  If that is the case then we can write a letter stating that he needs to go into a facility that offers more care and medication administration.

## 2018-08-24 NOTE — Telephone Encounter (Signed)
[name] is calling stating that she is needing a letter wrote and signed by Dr Laurance Flatten to get him out of his contract with his senior apartment. According to [name] the pt is not taking his medicines or taking care of himself.

## 2018-08-27 ENCOUNTER — Encounter: Payer: Self-pay | Admitting: *Deleted

## 2018-08-27 NOTE — Telephone Encounter (Signed)
[name] aware and letter written. [facility] to sign

## 2018-09-09 DIAGNOSIS — J449 Chronic obstructive pulmonary disease, unspecified: Secondary | ICD-10-CM | POA: Diagnosis not present

## 2018-09-18 ENCOUNTER — Other Ambulatory Visit: Payer: Self-pay | Admitting: Family Medicine

## 2018-09-20 ENCOUNTER — Telehealth: Payer: Self-pay | Admitting: Family Medicine

## 2018-09-20 NOTE — Telephone Encounter (Signed)
Spoke with [name]:  He is having more trouble breathing  Anxiety is bad  Nervous and upset and makes breathing worse.  He is at home (not in apartment anymore)  She is with him the majority of the time.  Xanax 0.25 TID PRN  He is taking is more often - she thinks.  He states that he is "taking them as needed"  Aware that this most likely will not increase.   Still on paxil and other meds all the same. He does not want a appointment  He thinks it is his nerves.

## 2018-09-20 NOTE — Telephone Encounter (Signed)
Please review and advise.

## 2018-09-21 NOTE — Telephone Encounter (Signed)
Please call patient and tell him that he should take no more than 3 Xanax daily and only if needed.

## 2018-09-21 NOTE — Telephone Encounter (Signed)
[name] aware - appt next week

## 2018-09-26 ENCOUNTER — Other Ambulatory Visit: Payer: Self-pay

## 2018-09-26 ENCOUNTER — Ambulatory Visit (INDEPENDENT_AMBULATORY_CARE_PROVIDER_SITE_OTHER): Payer: Medicare HMO | Admitting: Family Medicine

## 2018-09-26 ENCOUNTER — Encounter: Payer: Self-pay | Admitting: Family Medicine

## 2018-09-26 DIAGNOSIS — E119 Type 2 diabetes mellitus without complications: Secondary | ICD-10-CM

## 2018-09-26 DIAGNOSIS — C8590 Non-Hodgkin lymphoma, unspecified, unspecified site: Secondary | ICD-10-CM

## 2018-09-26 DIAGNOSIS — E559 Vitamin D deficiency, unspecified: Secondary | ICD-10-CM

## 2018-09-26 DIAGNOSIS — J449 Chronic obstructive pulmonary disease, unspecified: Secondary | ICD-10-CM

## 2018-09-26 DIAGNOSIS — I5032 Chronic diastolic (congestive) heart failure: Secondary | ICD-10-CM

## 2018-09-26 DIAGNOSIS — R0602 Shortness of breath: Secondary | ICD-10-CM | POA: Diagnosis not present

## 2018-09-26 DIAGNOSIS — I4891 Unspecified atrial fibrillation: Secondary | ICD-10-CM | POA: Diagnosis not present

## 2018-09-26 DIAGNOSIS — Z7901 Long term (current) use of anticoagulants: Secondary | ICD-10-CM

## 2018-09-26 DIAGNOSIS — E782 Mixed hyperlipidemia: Secondary | ICD-10-CM

## 2018-09-26 DIAGNOSIS — I739 Peripheral vascular disease, unspecified: Secondary | ICD-10-CM

## 2018-09-26 DIAGNOSIS — Z794 Long term (current) use of insulin: Secondary | ICD-10-CM

## 2018-09-26 DIAGNOSIS — J9611 Chronic respiratory failure with hypoxia: Secondary | ICD-10-CM

## 2018-09-26 MED ORDER — FLUTICASONE PROPIONATE 50 MCG/ACT NA SUSP: 2.0000 | Freq: Every day | NASAL | 6 refills | Status: DC

## 2018-09-26 NOTE — Patient Instructions (Signed)
Continue to practice good hand and respiratory hygiene Continue to follow-up with oncologist and cardiologist and pulmonologist Avoid taking the Xanax or alprazolam anymore than necessary and certainly no more than 3 times daily Drink plenty of fluids and stay well-hydrated Check blood sugars regularly and make every effort to lose weight through diet and exercise Continue to use oxygen Drink plenty of water and fluids

## 2018-09-26 NOTE — Progress Notes (Signed)
Virtual Visit Via telephone Note I connected with@ on 09/26/18 by telephone and verified that I am speaking with the correct person or authorized healthcare agent using two identifiers. Justin Ruiz is currently located at home and there are no unauthorized people in close proximity. I completed this visit while in a private location in my home .  This visit type was conducted due to national recommendations for restrictions regarding the COVID-19 Pandemic (e.g. social distancing).  This format is felt to be most appropriate for this patient at this time.  All issues noted in this document were discussed and addressed.  No physical exam was performed.    I discussed the limitations, risks, security and privacy concerns of performing an evaluation and management service by telephone and the availability of in person appointments. I also discussed with the patient that there may be a patient responsible charge related to this service. The patient expressed understanding and agreed to proceed.   Date:  09/26/2018    ID:  Justin Ruiz      1950/12/08        [id number]   Patient Care Team Patient Care Team: Chipper Herb, MD as PCP - General (Family Medicine) Curt Bears, MD as Consulting Physician (Oncology) Minus Breeding, MD as Consulting Physician (Cardiology) Deneise Lever, MD as Consulting Physician (Pulmonary Disease) Melina Schools, OD as Consulting Physician (Optometry) Sandford Craze, MD as Referring Physician (Unknown Physician Specialty) Inda Castle, MD (Inactive) as Consulting Physician (Gastroenterology) Orth[facility], [facility] as Consulting Physician (Specialist)  Reason for Visit: Primary Care Follow-up     History of Present Illness & Review of Systems:     Justin Ruiz is a 68 y.o. year old male primary care patient that presents today for a telehealth visit.  The patient is pleasant and awake work.  His wife was present during the tele-visit.  She helped  him with his response to questions asked of him but he pretty much responded pretty quickly with every question posed.  He denies any significant chest pain but does have some at times.  He also has shortness of breath at times.  He is very inactive and not doing a lot.  He uses his cane and walker.  He stays pretty much seated during the day.  He does have O2 going at 3 L/min continuously.  He has an upcoming appointment with his pulmonologist in July and he plans to keep that appointment.  He has seen the cardiologist and also is followed regularly by his oncologist because of his lymphoma.  He denies any trouble with swallowing heartburn indigestion nausea vomiting diarrhea or blood in the stool.  He is passing his water well.  He is currently no longer on Coumadin but on Eliquis 5 mg twice daily and this will be very helpful in reducing his number of trips to the office for pro time checks.  He last saw Dr. Earlie Server in January.  He has seen Dr. Archie Patten and has been followed by Dr. Deatra Ina in the past for his GI issues.  His family history is positive for colon cancer and liver cancer and diabetes.  He is currently using Afrin and we encouraged him to not use that but we will call in a prescription for Flonase instead.  We also encouraged him to use less Xanax if possible.  Review of systems as stated, otherwise negative.  The patient does not have symptoms concerning for COVID-19 infection (fever,  chills, cough, or new shortness of breath).      Current Medications (Verified) Allergies as of 09/26/2018      Reactions   Avapro [irbesartan] Other (See Comments)   "doesnt sit right with me"--light headed   Lipitor [atorvastatin Calcium] Other (See Comments)   Leg pain      Medication List       Accurate as of September 26, 2018  8:36 AM. If you have any questions, ask your nurse or doctor.        albuterol (2.5 MG/3ML) 0.083% nebulizer solution Commonly known as:  PROVENTIL Take 3 mLs (2.5 mg total)  by nebulization every 6 (six) hours as needed for wheezing or shortness of breath.   albuterol 108 (90 Base) MCG/ACT inhaler Commonly known as:  VENTOLIN HFA Inhale 2 puffs into the lungs every 6 (six) hours as needed for wheezing or shortness of breath.   ALPRAZolam 0.25 MG tablet Commonly known as:  XANAX Take 1 tablet (0.25 mg total) by mouth 3 (three) times daily as needed. for anxiety   apixaban 5 MG Tabs tablet Commonly known as:  Eliquis Take 1 tablet (5 mg total) by mouth 2 (two) times daily.   Clever Chek Auto-Code Voice Devi USE AS DIRECTED   furosemide 40 MG tablet Commonly known as:  LASIX TAKE 1 TABLET BY MOUTH TWICE DAILY AS DIRECTED   insulin NPH Human 100 UNIT/ML injection Commonly known as:  NovoLIN N ReliOn Inject 0.5 mLs (50 Units total) into the skin daily before breakfast.   insulin regular 100 units/mL injection Commonly known as:  NOVOLIN R Inject 0.08-0.09 mLs (8-9 Units total) into the skin 2 (two) times daily before a meal.   ipratropium-albuterol 0.5-2.5 (3) MG/3ML Soln Commonly known as:  DUONEB Take 3 mLs by nebulization every 6 (six) hours as needed.   Lancing Device Misc USE AS DIRECTED   metFORMIN 500 MG tablet Commonly known as:  GLUCOPHAGE TAKE 1 TABLET BY MOUTH IN THE MORNING AND 2 IN THE EVENING   metoprolol tartrate 100 MG tablet Commonly known as:  LOPRESSOR Take 1 tablet by mouth twice daily   oxymetazoline 0.05 % nasal spray Commonly known as:  AFRIN Place 1 spray into both nostrils 2 (two) times daily as needed for congestion.   PARoxetine 20 MG tablet Commonly known as:  PAXIL Take 1 tablet by mouth once daily   simvastatin 40 MG tablet Commonly known as:  ZOCOR Take 1 tablet by mouth once daily   Vitamin D3 125 MCG (5000 UT) Tabs Take 1 tablet by mouth every morning.           Allergies (Verified)    Avapro [irbesartan] and Lipitor [atorvastatin calcium]  Past Medical History Past Medical History:   Diagnosis Date  . Acute diastolic heart failure ([place]) 03/28/2016  . Acute encephalopathy 03/28/2016  . Acute on chronic respiratory failure with hypoxia ([place]) 12/04/2016  . Arthritis   . Asthma   . Atrial fibrillation ([place])    on AC  . Benign neoplasm of ascending colon 05/05/2014  . BPH (benign prostatic hypertrophy)   . Chronic anticoagulation 08/20/2012  . Chronic respiratory failure with hypoxia ([place]) 11/10/2017  . Colon polyps   . COPD mixed type ([place]) 03/28/2016   PFT 06/17/14- severe obstructive airways disease with slight response to bronchodilator. FVC 2.03/42%, FEV1 1.02/28%, ratio 0.5, TLC 82%, DLCO 59%  . Diabetes mellitus   . Diabetic neuropathy, type II diabetes mellitus ([place]) 09/25/2014  . Diabetic  retinopathy ([place]) 05/28/2017  . 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 GERD (gastroesophageal reflux disease)   . HTN (hypertension)    x 3 years  . Hyperlipidemia   . Lung nodules 03/11/2013  . Mediastinal adenopathy 06/20/2013   CT  & PET 2/15   . Memory loss   . 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 NHL (non-Hodgkin's lymphoma) ([place])    nhl dx 9/11  . Obesity    exogenous  . Obstructive sleep apnea 05/25/2014   NPSG-  04/07/2014-severe obstructive sleep apnea-AHI 75.1 per hour. CPAP titrated to 16. He required supplemental oxygen at 2 L/ [facility] which he already wears for sleep at home. Weight 338 pounds   . Peripheral vascular insufficiency ([place]) 08/02/2017  . Shingles   . Sleep apnea    CPAP machine is broken  . Thrombocytopenia ([place]) 11/23/2016     Past Surgical History:  Procedure Laterality Date  . APPENDECTOMY    . COLONOSCOPY WITH PROPOFOL N/A 05/05/2014   Procedure: COLONOSCOPY WITH PROPOFOL;  Surgeon: Inda Castle, MD;  Location: [facility];  Service: Endoscopy;  Laterality: N/A;    Social History   Socioeconomic History  . Marital status: Married    Spouse name: Not on file  . Number of  children: 0  . Years of education: 65  . Highest education level: Not on file  Occupational History  . Occupation: Disabled  Social Needs  . Financial resource strain: Not on file  . Food insecurity:    Worry: Not on file    Inability: Not on file  . Transportation needs:    Medical: Not on file    Non-medical: Not on file  Tobacco Use  . Smoking status: Former Smoker    Packs/day: 1.00    Years: 40.00    Pack years: 40.00    Types: Cigarettes    Start date: 12/17/1968    Last attempt to quit: 11/14/2004    Years since quitting: 13.8  . Smokeless tobacco: Never Used  . Tobacco comment: 2 ppd   Substance and Sexual Activity  . Alcohol use: No    Alcohol/week: 0.0 standard drinks    Comment: previous  . Drug use: No  . Sexual activity: Yes  Lifestyle  . Physical activity:    Days per week: Not on file    Minutes per session: Not on file  . Stress: Not on file  Relationships  . Social connections:    Talks on phone: Not on file    Gets together: Not on file    Attends religious service: Not on file    Active member of club or organization: Not on file    Attends meetings of clubs or organizations: Not on file    Relationship status: Not on file  Other Topics Concern  . Not on file  Social History Narrative   Married, disabled Dealer (COPD). Daily caffeine use - 2 cup/day.   Right-handed.     Family History  Problem Relation Age of Onset  . Liver cancer Mother   . Diabetes Mother   . Heart disease Mother   . Colon cancer Father   . Prostate cancer Father   . Colon polyps Father   . Pulmonary embolism Sister   . Diabetes Sister   . Heart attack Sister   . Aortic aneurysm Sister   . COPD Sister   . Heart disease Sister   . COPD Sister   .  Heart disease Sister   . Diabetes Maternal Grandmother       Labs/Other Tests and Data Reviewed:    Wt Readings from Last 3 Encounters:  06/21/18 280 lb 9.6 oz (127.3 kg)  06/15/18 277 lb (125.6 kg)  05/30/18 284  lb (128.8 kg)   Temp Readings from Last 3 Encounters:  06/21/18 98.1 F (36.7 C) (Oral)  06/15/18 (!) 97.5 F (36.4 C) (Oral)  05/30/18 (!) 97.5 F (36.4 C) (Oral)   BP Readings from Last 3 Encounters:  06/21/18 123/61  06/15/18 120/77  05/30/18 129/78   Pulse Readings from Last 3 Encounters:  06/21/18 (!) 53  06/15/18 87  05/30/18 (!) 102     Lab Results  Component Value Date   HGBA1C 11.6 (H) 05/30/2018   HGBA1C 7.9 (H) 08/02/2017   HGBA1C 7.3 (H) 03/27/2017   Lab Results  Component Value Date   MICROALBUR 20 11/28/2013   LDLCALC 72 05/30/2018   CREATININE 1.27 (H) 06/21/2018       Chemistry      Component Value Date/Time   NA 139 06/21/2018 0950   NA 137 05/30/2018 1047   NA 139 05/19/2016 0933   K 4.3 06/21/2018 0950   K 4.0 05/19/2016 0933   CL 93 (L) 06/21/2018 0950   CL 100 09/03/2012 0751   CO2 35 (H) 06/21/2018 0950   CO2 32 (H) 05/19/2016 0933   BUN 28 (H) 06/21/2018 0950   BUN 12 05/30/2018 1047   BUN 25.3 05/19/2016 0933   CREATININE 1.27 (H) 06/21/2018 0950   CREATININE 1.2 05/19/2016 0933   GLU 185 03/29/2012      Component Value Date/Time   CALCIUM 9.4 06/21/2018 0950   CALCIUM 10.4 05/19/2016 0933   ALKPHOS 90 06/21/2018 0950   ALKPHOS 90 05/19/2016 0933   AST 14 (L) 06/21/2018 0950   AST 19 05/19/2016 0933   ALT 9 06/21/2018 0950   ALT 14 05/19/2016 0933   BILITOT 0.9 06/21/2018 0950   BILITOT 1.21 (H) 05/19/2016 0933         OBSERVATIONS/ OBJECTIVE:     The patient responded appropriately to questions asked of him.  His wife was there supporting him during this conversation.  He says his blood sugars have been running high at times as high as 300.  I told him this was too high.  He may need some adjustment with his medication once the lab work has been returned and we would do this with the clinical pharmacist help as she is a certified diabetic educator.  He has not checked any blood pressure readings.  He has none to report  recently.  He has not checked his weights on a regular basis and he does have some edema in his legs but is sitting a lot.  He continues to use the O2 at 3 L/min.  Physical exam deferred due to nature of telephonic visit.  ASSESSMENT & PLAN    Time:   Today, I have spent 26 minutes with the patient via telephone discussing the above including Covid precautions.     Visit Diagnoses: 1. Atrial fibrillation with RVR (HCC) -Aloe up with Dr. Percival Spanish as planned and continue with Eliquis as planned  2. Chronic obstructive pulmonary disease, unspecified COPD type ([place]) -Follow-up with pulmonology as planned  3. SOB (shortness of breath) -Continue with O2 at 3 L/min and use nebulizer treatments that are available and use albuterol rescue inhaler as needed  4. Type 2 diabetes mellitus treated  with insulin ([place]) -Continue with current treatment and try to do better with diet and exercise around the house -If blood sugars stay elevated will need additional insulin and or p.o. medications  5. Vitamin D deficiency -Continue with vitamin D replacement pending results of lab work  6. Morbid obesity ([place]) -Continue to make every effort to lose weight through diet and exercise  7. Chronic anticoagulation -Continue with Eliquis 5 mg twice daily due to chronic atrial fibrillation  8. Mixed hyperlipidemia -Continue with simvastatin and aggressive therapeutic lifestyle changes geared to weight loss  9. Non-Hodgkin's lymphoma in adult [facility]) -Follow-up with Dr. Earlie Server as planned  10. Chronic respiratory failure with hypoxia (HCC) -Continue with O2 follow-up with pulmonology as planned and use nebulizers as currently doing  11. Chronic diastolic heart failure (HCC) -Continue with fluid pills watch sodium intake and lose weight as much as possible  12. Morbid obesity due to excess calories ([place]) -Make every effort to lose weight through diet and exercise  13. Peripheral vascular insufficiency  ([place]) -Stay as active physically as possible walk around the home walk outside, use assistance with walker or cane so as not to fall.  Patient Instructions  Continue to practice good hand and respiratory hygiene Continue to follow-up with oncologist and cardiologist and pulmonologist Avoid taking the Xanax or alprazolam anymore than necessary and certainly no more than 3 times daily Drink plenty of fluids and stay well-hydrated Check blood sugars regularly and make every effort to lose weight through diet and exercise Continue to use oxygen Drink plenty of water and fluids     The above assessment and management plan was discussed with the patient. The patient verbalized understanding of and has agreed to the management plan. Patient is aware to call the clinic if symptoms persist or worsen. Patient is aware when to return to the clinic for a follow-up visit. Patient educated on when it is appropriate to go to the emergency department.    Chipper Herb, MD [address], [place], [place] [address] Ph [phone]   Arrie Senate MD

## 2018-09-26 NOTE — Addendum Note (Signed)
Addended by: Zannie Cove on: 09/26/2018 04:46 PM   Modules accepted: Orders

## 2018-10-09 ENCOUNTER — Ambulatory Visit: Payer: Medicare Other | Admitting: Family Medicine

## 2018-10-10 DIAGNOSIS — J449 Chronic obstructive pulmonary disease, unspecified: Secondary | ICD-10-CM | POA: Diagnosis not present

## 2018-11-01 ENCOUNTER — Telehealth: Payer: Self-pay | Admitting: Internal Medicine

## 2018-11-01 ENCOUNTER — Encounter: Payer: Self-pay | Admitting: Internal Medicine

## 2018-11-01 ENCOUNTER — Ambulatory Visit (INDEPENDENT_AMBULATORY_CARE_PROVIDER_SITE_OTHER): Payer: Medicare HMO

## 2018-11-01 ENCOUNTER — Other Ambulatory Visit: Payer: Self-pay

## 2018-11-01 ENCOUNTER — Ambulatory Visit: Payer: Medicare Other | Admitting: Internal Medicine

## 2018-11-01 ENCOUNTER — Telehealth: Payer: Self-pay | Admitting: Family Medicine

## 2018-11-01 VITALS — BP 114/60 | HR 82 | Temp 98.8°F | Ht 69.0 in | Wt 274.6 lb

## 2018-11-01 DIAGNOSIS — R911 Solitary pulmonary nodule: Secondary | ICD-10-CM

## 2018-11-01 DIAGNOSIS — J9611 Chronic respiratory failure with hypoxia: Secondary | ICD-10-CM

## 2018-11-01 DIAGNOSIS — G4733 Obstructive sleep apnea (adult) (pediatric): Secondary | ICD-10-CM | POA: Diagnosis not present

## 2018-11-01 DIAGNOSIS — G4730 Sleep apnea, unspecified: Secondary | ICD-10-CM | POA: Diagnosis not present

## 2018-11-01 DIAGNOSIS — J449 Chronic obstructive pulmonary disease, unspecified: Secondary | ICD-10-CM

## 2018-11-01 DIAGNOSIS — I10 Essential (primary) hypertension: Secondary | ICD-10-CM | POA: Diagnosis not present

## 2018-11-01 DIAGNOSIS — I700 Atherosclerosis of aorta: Secondary | ICD-10-CM | POA: Diagnosis not present

## 2018-11-01 MED ORDER — TRELEGY ELLIPTA 100-62.5-25 MCG/INH IN AEPB: 1.0000 | INHALATION_SPRAY | Freq: Every day | RESPIRATORY_TRACT | 0 refills | Status: DC

## 2018-11-01 MED ORDER — TRELEGY ELLIPTA 100-62.5-25 MCG/INH IN AEPB: 1.0000 | INHALATION_SPRAY | Freq: Every day | RESPIRATORY_TRACT | 12 refills | Status: DC

## 2018-11-01 NOTE — Patient Instructions (Signed)
Order- lab- draw pending labs, including D-dimer and BNP for dx chronic hypoxic respiratory failure, Dyspnea  Order- CXR  Dx Chronic hypoxic respiratory failure  Order- DME [facility]- please refit mask of choice for better fit and comfort. Continue CPAP auto 12-20, humidifier, supplies, AirView/ card  Sample and printed script for Trelegy inhaler  Inhale 1 puff, then rinse mouth well, once daily "maintenance"   If the sample seems to help, then you can get the script filled and stay on it.  You can continue to use the nebulizer with Duoneb and the albuterol rescue inhaler as before, as needed  You can set your oxygen anywhere between 2 and 4 liters  Please do not smoke at all

## 2018-11-01 NOTE — Progress Notes (Signed)
HPI male former smoker followed for COPD/chronic hypoxic respiratory failure, OSA, lung nodules, allergic rhinitis, complicated by history lymphoma, DM, A. fib/Coumadin, morbid obesity NPSG 04/07/14-  AHI 75.1/ hr, desaturation to 79% ( O2 was added at 2L), CPAP to  16, Body weight 338 lbs PFT 06/17/14- severe obstructive airways disease with slight response to bronchodilator. FVC 2.03/42%, FEV1 1.02/28%, ratio 0.5, TLC 82%, DLCO 59% PFT 11/10/2017-very severe obstructive airways disease with response to bronchodilator, air trapping, diffusion severely reduced.  FVC 1.65/35%, FEV1 0.79/22%, ratio 0.48, TLC 104%, DLCO 29% ----------------------------------------------------------------- . 11/10/2017- 68 year old male smoker followed for COPD/chronic hypoxic respiratory failure, OSA, allergic rhinitis, lung nodules, complicated by history lymphoma, DM, A. fib/Coumadin, morbid obesity CPAP auto 12-20/[facility]  O2 3 L continuous -----He says breathing has been worse he is more sob. He says he wears CPAP every night but mask is not fitting well. Pro-air HFA, DuoNeb, Trelegy, He says wears CPAP all night every night.  Dislikes the straps. Download records NO use.  Says Trelegy helps.  Nebulizer helps, used once or twice daily with benefit lasting a few hours.  Little cough.  Breathing is "always bad" without acute change. PFT 11/10/2017-very severe obstructive airways disease with response to bronchodilator, air trapping, diffusion severely reduced.  FVC 1.65/35%, FEV1 0.79/22%, ratio 0.48, TLC 104%, DLCO 29% CXR 09/25/2017 IMPRESSION: Atelectatic change lateral right base. No edema or consolidation. Stable cardiac silhouette. There is aortic atherosclerosis. Aortic Atherosclerosis (ICD10-I70.0).  11/01/2018- 68 year-old male former smoker followed for COPD/chronic hypoxic respiratory failure, OSA, allergic rhinitis, lung nodules, complicated by history lymphoma, DM2, A. fib/Eliquis, morbid obesity CPAP auto  12-20/[facility]  O2 3-4 L continuous !!Reports stopping smoking 2 ppd as of 10/18/2018 then started back intermittently 5 cigs/day. Discussed today with wife present- she likely buys him cigs. Body weight today 274 lbs -----pt and pt spouse reports pt's breathing has been worse; more SOB, chest tightness; currently on O2 4L POC, uses 2-3L continuous at home; has CPAP but reports not using it d/t mask issues; pt uses nebulizer q4-6h and rescue inhaler PRN Neb Duoneb, albuterol hfa More SOB last few days- ? Weather. Limited exertion tolerance.Little cough/ phlegm. No fever or blood. Sleeps in recliner but has medical bed.  He has blood work pending for PCP and we will add ours to that draw.    ROS-see HPI      + = positive Constitutional:   No-   weight loss, night sweats, fevers, chills, +fatigue, lassitude. HEENT:   No-  headaches, difficulty swallowing, tooth/dental problems, sore throat,       No-  sneezing, itching, ear ache, nasal congestion, post nasal drip,  CV:  No-   chest pain, orthopnea, PND, swelling in lower extremities, anasarca,                                                           dizziness, palpitations Resp: + shortness of breath with exertion or at rest.              No-   productive cough,  No non-productive cough,  No- coughing up of blood.              No-   change in color of mucus.   Skin: No-   rash or lesions. GI:  No-   heartburn, indigestion, abdominal pain, nausea, vomiting,  GU:  MS:  No-   joint pain or swelling.  . Neuro-     nothing unusual Psych:  No- change in mood or affect. No depression or anxiety.  No memory loss.  OBJ- Physical Exam     O2 3 L, saturation 98% at rest General- Alert, Oriented, Affect-appropriate, Distress- none acute, +obese Skin- rash-none, lesions- none, excoriation- none Lymphadenopathy- none Head- atraumatic            Eyes- Gross vision intact, PERRLA, conjunctivae and secretions clear,             Ears- Hearing, canals-normal             Nose- Clear, no-Septal dev, mucus, polyps, erosion, perforation             Throat- Mallampati II-III , mucosa clear , drainage- none, tonsils- atrophic Neck- flexible , trachea midline, no stridor , thyroid nl, carotid no bruit Chest - symmetrical excursion , unlabored           Heart/CV- IRR +, no murmur , no gallop  , no rub, nl s1 s2                           - JVD- none , edema- none, stasis changes+, varices- none           Lung- decreased airflow/clear, unlabored, wheeze+mild, upper zpnes , dullness-none, rub- none           Chest wall-  Abd-  Br/ Gen/ Rectal- Not done, not indicated Extrem- cyanosis- none, clubbing, none, atrophy- none, strength- nl Neuro- grossly intact to observation

## 2018-11-01 NOTE — Telephone Encounter (Signed)
Received call report from [place] with [facility] on patient's CXR done on 11/01/2018. Dr. Annamaria Boots please review the result/impression copied below:  IMPRESSION: 1. Questionable new small nodular density overlying the RIGHT perihilar region. Recommend chest CT for further characterization. 2. Lungs otherwise clear. No evidence of pneumonia or pulmonary edema. 3. Aortic atherosclerosis.  Please advise, thank you.

## 2018-11-01 NOTE — Progress Notes (Signed)
Patient seen in the office today and instructed on use of Trelegy.  Patient expressed understanding and demonstrated technique.  

## 2018-11-01 NOTE — Telephone Encounter (Signed)
No he needs to be seen sooner then that by PCP

## 2018-11-02 ENCOUNTER — Telehealth: Payer: Self-pay | Admitting: Internal Medicine

## 2018-11-02 ENCOUNTER — Telehealth: Payer: Self-pay | Admitting: Family Medicine

## 2018-11-02 NOTE — Assessment & Plan Note (Signed)
Emphasized importance, given his medical problems, of using CPAP all night, every night, and anytime he sleeps. Plan- Continue CPAP auto 12-20 with O2 bleed in

## 2018-11-02 NOTE — Assessment & Plan Note (Addendum)
Absolutely imperative that he stay away from tobacco. Education again.  Plan CXR, Trelegy trial

## 2018-11-02 NOTE — Telephone Encounter (Signed)
Yes it looks like his pulmonologist wants a d-dimer so go ahead and add that, it also looks like he wants a BNP to go ahead and add that.  You can add a chemistry panel as well but you will probably have to take out the basic metabolic panel, please put all the orders under my name because they were under Dr. Georgia Duff before.

## 2018-11-02 NOTE — Telephone Encounter (Signed)
Please advise if this is okay to do. You have not seen this patient - [facility] patient

## 2018-11-02 NOTE — Assessment & Plan Note (Addendum)
Dependent on O2- Plan- continue O2 2-4L. We will add D-dimer and BNP to pending labs

## 2018-11-02 NOTE — Telephone Encounter (Signed)
I attempted to call pt regarding pending and additional labs. Pt seen in office yesterday 11/01/2018 and could not get pending labs from PCP drawn. Pt also needed additional labs: D-Dimer & BNP, which was missed. I left a detailed message per pt's DPR that we will be calling the pt's PCP's office to request getting these labs drawn there.   Called & spoke w/ Justin Ruiz. The representative I spoke with stated the pt has appt 11/19/2018 w/ new PCP provider. I made her aware of the pending labs and additional D-Dimer & BNP needing to be drawn. Rep verbalized understanding and stated she would put in a message to pt's new PCP.   Routing to CY as an [place]. Nothing further needed at this time.

## 2018-11-05 ENCOUNTER — Other Ambulatory Visit: Payer: Self-pay

## 2018-11-05 ENCOUNTER — Other Ambulatory Visit: Payer: Medicare HMO

## 2018-11-05 DIAGNOSIS — E119 Type 2 diabetes mellitus without complications: Secondary | ICD-10-CM | POA: Diagnosis not present

## 2018-11-05 DIAGNOSIS — I4891 Unspecified atrial fibrillation: Secondary | ICD-10-CM

## 2018-11-05 DIAGNOSIS — E782 Mixed hyperlipidemia: Secondary | ICD-10-CM | POA: Diagnosis not present

## 2018-11-05 DIAGNOSIS — E559 Vitamin D deficiency, unspecified: Secondary | ICD-10-CM | POA: Diagnosis not present

## 2018-11-05 DIAGNOSIS — J9611 Chronic respiratory failure with hypoxia: Secondary | ICD-10-CM

## 2018-11-05 DIAGNOSIS — J449 Chronic obstructive pulmonary disease, unspecified: Secondary | ICD-10-CM | POA: Diagnosis not present

## 2018-11-05 DIAGNOSIS — R0602 Shortness of breath: Secondary | ICD-10-CM | POA: Diagnosis not present

## 2018-11-05 DIAGNOSIS — Z794 Long term (current) use of insulin: Secondary | ICD-10-CM

## 2018-11-05 DIAGNOSIS — I739 Peripheral vascular disease, unspecified: Secondary | ICD-10-CM | POA: Diagnosis not present

## 2018-11-05 DIAGNOSIS — Z7901 Long term (current) use of anticoagulants: Secondary | ICD-10-CM

## 2018-11-05 DIAGNOSIS — I5032 Chronic diastolic (congestive) heart failure: Secondary | ICD-10-CM

## 2018-11-05 DIAGNOSIS — C8590 Non-Hodgkin lymphoma, unspecified, unspecified site: Secondary | ICD-10-CM

## 2018-11-05 DIAGNOSIS — R6889 Other general symptoms and signs: Secondary | ICD-10-CM | POA: Diagnosis not present

## 2018-11-05 LAB — BAYER DCA HB A1C WAIVED: HB A1C (BAYER DCA - WAIVED): >14 % — ABNORMAL HIGH (ref <7.0)

## 2018-11-05 NOTE — Telephone Encounter (Signed)
Patient coming in today for blood work.  Also has an appt with Dr.Stacks tomorrow 7/14 for elevated BS

## 2018-11-05 NOTE — Telephone Encounter (Signed)
Appt made.  Wife aware

## 2018-11-05 NOTE — Telephone Encounter (Signed)
Please contact the patient to come in tomorrow on my acute care day Thanks, WS

## 2018-11-06 ENCOUNTER — Encounter: Payer: Self-pay | Admitting: Family Medicine

## 2018-11-06 ENCOUNTER — Ambulatory Visit (INDEPENDENT_AMBULATORY_CARE_PROVIDER_SITE_OTHER): Payer: Medicare HMO | Admitting: Family Medicine

## 2018-11-06 VITALS — BP 114/63 | HR 96 | Temp 96.7°F | Ht 69.0 in | Wt 277.8 lb

## 2018-11-06 DIAGNOSIS — J449 Chronic obstructive pulmonary disease, unspecified: Secondary | ICD-10-CM

## 2018-11-06 DIAGNOSIS — E1140 Type 2 diabetes mellitus with diabetic neuropathy, unspecified: Secondary | ICD-10-CM | POA: Diagnosis not present

## 2018-11-06 DIAGNOSIS — J9611 Chronic respiratory failure with hypoxia: Secondary | ICD-10-CM

## 2018-11-06 DIAGNOSIS — Z794 Long term (current) use of insulin: Secondary | ICD-10-CM

## 2018-11-06 LAB — BMP8+EGFR
BUN/Creatinine Ratio: 19 (ref 10–24)
BUN: 21 mg/dL (ref 8–27)
CO2: 28 mmol/L (ref 20–29)
Calcium: 9.7 mg/dL (ref 8.6–10.2)
Chloride: 92 mmol/L — ABNORMAL LOW (ref 96–106)
Creatinine, Ser: 1.12 mg/dL (ref 0.76–1.27)
GFR calc Af Amer: 78 mL/min/{1.73_m2} (ref >59)
GFR calc non Af Amer: 68 mL/min/{1.73_m2} (ref >59)
Glucose: 257 mg/dL — ABNORMAL HIGH (ref 65–99)
Potassium: 4.3 mmol/L (ref 3.5–5.2)
Sodium: 135 mmol/L (ref 134–144)

## 2018-11-06 LAB — CBC WITH DIFFERENTIAL/PLATELET
Basophils Absolute: 0.1 10*3/uL (ref 0.0–0.2)
Basos: 1 %
EOS (ABSOLUTE): 0.2 10*3/uL (ref 0.0–0.4)
Eos: 3 %
Hematocrit: 42.3 % (ref 37.5–51.0)
Hemoglobin: 14.2 g/dL (ref 13.0–17.7)
Immature Grans (Abs): 0 10*3/uL (ref 0.0–0.1)
Immature Granulocytes: 0 %
Lymphocytes Absolute: 2.1 10*3/uL (ref 0.7–3.1)
Lymphs: 34 %
MCH: 29.8 pg (ref 26.6–33.0)
MCHC: 33.6 g/dL (ref 31.5–35.7)
MCV: 89 fL (ref 79–97)
Monocytes Absolute: 0.4 10*3/uL (ref 0.1–0.9)
Monocytes: 7 %
Neutrophils Absolute: 3.4 10*3/uL (ref 1.4–7.0)
Neutrophils: 55 %
Platelets: 244 10*3/uL (ref 150–450)
RBC: 4.77 x10E6/uL (ref 4.14–5.80)
RDW: 12.2 % (ref 11.6–15.4)
WBC: 6.2 10*3/uL (ref 3.4–10.8)

## 2018-11-06 LAB — LIPID PANEL
Chol/HDL Ratio: 5.1 ratio — ABNORMAL HIGH (ref 0.0–5.0)
Cholesterol, Total: 223 mg/dL — ABNORMAL HIGH (ref 100–199)
HDL: 44 mg/dL (ref >39)
LDL Calculated: 115 mg/dL — ABNORMAL HIGH (ref 0–99)
Triglycerides: 320 mg/dL — ABNORMAL HIGH (ref 0–149)
VLDL Cholesterol Cal: 64 mg/dL — ABNORMAL HIGH (ref 5–40)

## 2018-11-06 LAB — THYROID PANEL WITH TSH
Free Thyroxine Index: 1.4 (ref 1.2–4.9)
T3 Uptake Ratio: 27 % (ref 24–39)
T4, Total: 5.2 ug/dL (ref 4.5–12.0)
TSH: 5.96 u[IU]/mL — ABNORMAL HIGH (ref 0.450–4.500)

## 2018-11-06 LAB — HEPATIC FUNCTION PANEL
ALT: 11 IU/L (ref 0–44)
AST: 12 IU/L (ref 0–40)
Albumin: 3.8 g/dL (ref 3.8–4.8)
Alkaline Phosphatase: 90 IU/L (ref 39–117)
Bilirubin Total: 0.8 mg/dL (ref 0.0–1.2)
Bilirubin, Direct: 0.17 mg/dL (ref 0.00–0.40)
Total Protein: 6.5 g/dL (ref 6.0–8.5)

## 2018-11-06 LAB — VITAMIN D 25 HYDROXY (VIT D DEFICIENCY, FRACTURES): Vit D, 25-Hydroxy: 36.1 ng/mL (ref 30.0–100.0)

## 2018-11-06 LAB — VITAMIN B12: Vitamin B-12: 424 pg/mL (ref 232–1245)

## 2018-11-06 MED ORDER — INSULIN REGULAR HUMAN 100 UNIT/ML IJ SOLN: 10.0000 [IU] | Freq: Two times a day (BID) | INTRAMUSCULAR | 0 refills | Status: DC

## 2018-11-06 MED ORDER — TRULICITY 0.75 MG/0.5ML ~~LOC~~ SOAJ: 1.0000 "pen " | SUBCUTANEOUS | 3 refills | Status: DC

## 2018-11-06 MED ORDER — INSULIN NPH (HUMAN) (ISOPHANE) 100 UNIT/ML ~~LOC~~ SUSP: SUBCUTANEOUS | 2 refills | Status: DC

## 2018-11-06 NOTE — Patient Instructions (Signed)
Carbohydrate Counting for Diabetes Mellitus, Adult  Carbohydrate counting is a method of keeping track of how many carbohydrates you eat. Eating carbohydrates naturally increases the amount of sugar (glucose) in the blood. Counting how many carbohydrates you eat helps keep your blood glucose within normal limits, which helps you manage your diabetes (diabetes mellitus). It is important to know how many carbohydrates you can safely have in each meal. This is different for every person. A diet and nutrition specialist (registered dietitian) can help you make a meal plan and calculate how many carbohydrates you should have at each meal and snack. Carbohydrates are found in the following foods:  Grains, such as breads and cereals.  Dried beans and soy products.  Starchy vegetables, such as potatoes, peas, and corn.  Fruit and fruit juices.  Milk and yogurt.  Sweets and snack foods, such as cake, cookies, candy, chips, and soft drinks. How do I count carbohydrates? There are two ways to count carbohydrates in food. You can use either of the methods or a combination of both. Reading "Nutrition Facts" on packaged food The "Nutrition Facts" list is included on the labels of almost all packaged foods and beverages in the [place] It includes:  The serving size.  Information about nutrients in each serving, including the grams (g) of carbohydrate per serving. To use the "Nutrition Facts":  Decide how many servings you will have.  Multiply the number of servings by the number of carbohydrates per serving.  The resulting number is the total amount of carbohydrates that you will be having. Learning standard serving sizes of other foods When you eat carbohydrate foods that are not packaged or do not include "Nutrition Facts" on the label, you need to measure the servings in order to count the amount of carbohydrates:  Measure the foods that you will eat with a food scale or measuring cup, if needed.   Decide how many standard-size servings you will eat.  Multiply the number of servings by 15. Most carbohydrate-rich foods have about 15 g of carbohydrates per serving. ? For example, if you eat 8 oz (170 g) of strawberries, you will have eaten 2 servings and 30 g of carbohydrates (2 servings x 15 g = 30 g).  For foods that have more than one food mixed, such as soups and casseroles, you must count the carbohydrates in each food that is included. The following list contains standard serving sizes of common carbohydrate-rich foods. Each of these servings has about 15 g of carbohydrates:   hamburger bun or  English muffin.   oz (15 mL) syrup.   oz (14 g) jelly.  1 slice of bread.  1 six-inch tortilla.  3 oz (85 g) cooked rice or pasta.  4 oz (113 g) cooked dried beans.  4 oz (113 g) starchy vegetable, such as peas, corn, or potatoes.  4 oz (113 g) hot cereal.  4 oz (113 g) mashed potatoes or  of a large baked potato.  4 oz (113 g) canned or frozen fruit.  4 oz (120 mL) fruit juice.  4-6 crackers.  6 chicken nuggets.  6 oz (170 g) unsweetened dry cereal.  6 oz (170 g) plain fat-free yogurt or yogurt sweetened with artificial sweeteners.  8 oz (240 mL) milk.  8 oz (170 g) fresh fruit or one small piece of fruit.  24 oz (680 g) popped popcorn. Example of carbohydrate counting Sample meal  3 oz (85 g) chicken breast.  6 oz (170 g)   brown rice.  4 oz (113 g) corn.  8 oz (240 mL) milk.  8 oz (170 g) strawberries with sugar-free whipped topping. Carbohydrate calculation 1. Identify the foods that contain carbohydrates: ? Rice. ? Corn. ? Milk. ? Strawberries. 2. Calculate how many servings you have of each food: ? 2 servings rice. ? 1 serving corn. ? 1 serving milk. ? 1 serving strawberries. 3. Multiply each number of servings by 15 g: ? 2 servings rice x 15 g = 30 g. ? 1 serving corn x 15 g = 15 g. ? 1 serving milk x 15 g = 15 g. ? 1 serving  strawberries x 15 g = 15 g. 4. Add together all of the amounts to find the total grams of carbohydrates eaten: ? 30 g + 15 g + 15 g + 15 g = 75 g of carbohydrates total. Summary  Carbohydrate counting is a method of keeping track of how many carbohydrates you eat.  Eating carbohydrates naturally increases the amount of sugar (glucose) in the blood.  Counting how many carbohydrates you eat helps keep your blood glucose within normal limits, which helps you manage your diabetes.  A diet and nutrition specialist (registered dietitian) can help you make a meal plan and calculate how many carbohydrates you should have at each meal and snack. This information is not intended to replace advice given to you by your health care provider. Make sure you discuss any questions you have with your health care provider. Document Released: 04/11/2005 Document Revised: 11/03/2016 Document Reviewed: 09/23/2015 Elsevier Patient Education  2020 [employer]  

## 2018-11-06 NOTE — Progress Notes (Signed)
Subjective:  Patient ID: Justin Ruiz, male    DOB: [birth year]  Age: 68 y.o. MRN: [id number]  CC: elevated BS   HPI Justin Ruiz presents forFollow-up of diabetes. Patient checks blood sugar at home.   250 fasting and 400 postprandial Patient denies symptoms such as polyuria, polydipsia, excessive hunger, nausea No significant hypoglycemic spells noted. Medications reviewed. Pt reports taking them regularly without complication/adverse reaction being reported today.  Does not know what a complex carb is. Has been avoiding diet soda and drinking regular soda when told diet soda was bad for you. He eats irregularly during the day as well, not coordinating the insulin dose with the meal. Sometimes he eats twice. Sometimes he eats three times a day. Not aware that he should take the Novolin N and eat 4 hours later or that he should eat 5-15 minutes after taking the R.  History Justin Ruiz has a past medical history of Acute diastolic heart failure ([place]) (03/28/2016), Acute encephalopathy (03/28/2016), Acute on chronic respiratory failure with hypoxia ([place]) (12/04/2016), Arthritis, Asthma, Atrial fibrillation ([place]), Benign neoplasm of ascending colon (05/05/2014), BPH (benign prostatic hypertrophy), Chronic anticoagulation (08/20/2012), Chronic respiratory failure with hypoxia ([place]) (11/10/2017), Colon polyps, COPD mixed type ([place]) (03/28/2016), Diabetes mellitus, Diabetic neuropathy, type II diabetes mellitus ([place]) (09/25/2014), Diabetic retinopathy ([place]) (05/28/2017), 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   He has a past surgical history that includes Appendectomy and Colonoscopy with propofol (N/A, 05/05/2014).   His family history includes Aortic aneurysm in his sister; COPD in his sister and sister; Colon cancer in his father; Colon polyps in his father; Diabetes in his maternal grandmother, mother, and sister; Heart attack in his sister; Heart disease in his mother, sister, and sister; Liver cancer in his mother; Prostate cancer in his father; Pulmonary embolism in his sister.He reports that he quit smoking about 2 weeks ago. His smoking use included cigarettes. He started smoking about 49 years ago. He has a 40.00 pack-year smoking history. He has never used smokeless tobacco. He reports that he does not drink alcohol or use drugs.  Current Outpatient Medications on File Prior to Visit  Medication Sig Dispense Refill   albuterol (PROVENTIL) (2.5 MG/3ML) 0.083% nebulizer solution Take 3 mLs (2.5 mg total) by nebulization every 6 (six) hours as needed for wheezing or shortness of breath. 75 mL 12   albuterol (VENTOLIN HFA) 108 (90 Base) MCG/ACT inhaler Inhale 2 puffs into the lungs every 6 (six) hours as needed for wheezing or shortness of breath. 1 Inhaler 6   ALPRAZolam (XANAX) 0.25 MG tablet Take 1 tablet (0.25 mg total) by mouth 3 (three) times daily as needed. for anxiety 90 tablet 5   apixaban (ELIQUIS) 5 MG TABS tablet Take 1 tablet (5 mg total) by mouth 2 (two) times daily. 60 tablet 3   Blood Glucose Monitoring Suppl (CLEVER CHEK AUTO-CODE VOICE) DEVI USE AS DIRECTED 1 each 2   Cholecalciferol (VITAMIN D3) 5000 UNITS TABS Take 1 tablet by mouth every morning.     fluticasone (FLONASE) 50 MCG/ACT nasal spray Place 2 sprays into both nostrils daily. 16 g 6   Fluticasone-Umeclidin-Vilant (TRELEGY ELLIPTA) 100-62.5-25 MCG/INH AEPB Inhale 1 puff into the lungs daily. Rinse mouth  well 60 each 12   furosemide (LASIX) 40 MG tablet TAKE 1 TABLET BY  MOUTH TWICE DAILY AS DIRECTED 180 tablet 0   ipratropium-albuterol (DUONEB) 0.5-2.5 (3) MG/3ML SOLN Take 3 mLs by nebulization every 6 (six) hours as needed. 1080 mL 3   Lancet Devices (LANCING DEVICE) MISC USE AS DIRECTED 1 each 2   metFORMIN (GLUCOPHAGE) 500 MG tablet TAKE 1 TABLET BY MOUTH IN THE MORNING AND 2 IN THE EVENING 270 tablet 0   metoprolol tartrate (LOPRESSOR) 100 MG tablet Take 1 tablet by mouth twice daily 180 tablet 0   oxymetazoline (AFRIN) 0.05 % nasal spray Place 1 spray into both nostrils 2 (two) times daily as needed for congestion.     PARoxetine (PAXIL) 20 MG tablet Take 1 tablet by mouth once daily 90 tablet 0   simvastatin (ZOCOR) 40 MG tablet Take 1 tablet by mouth once daily 90 tablet 0   No current facility-administered medications on file prior to visit.     ROS Review of Systems  Constitutional: Negative.   HENT: Negative.   Eyes: Negative for visual disturbance.  Respiratory: Positive for shortness of breath (COPD). Negative for cough.   Cardiovascular: Negative for chest pain and leg swelling.  Gastrointestinal: Negative for abdominal pain, diarrhea, nausea and vomiting.  Genitourinary: Negative for difficulty urinating.  Musculoskeletal: Negative for arthralgias and myalgias.  Skin: Negative for rash.  Neurological: Negative for headaches.  Psychiatric/Behavioral: Negative for sleep disturbance.    Objective:  BP 114/63    Pulse 96    Temp (!) 96.7 F (35.9 C) (Oral)    Ht 5\' 9"  (1.753 m)    Wt 277 lb 12.8 oz (126 kg)    SpO2 99%    BMI 41.02 kg/m   BP Readings from Last 3 Encounters:  11/06/18 114/63  11/01/18 114/60  06/21/18 123/61    Wt Readings from Last 3 Encounters:  11/06/18 277 lb 12.8 oz (126 kg)  11/01/18 274 lb 9.6 oz (124.6 kg)  06/21/18 280 lb 9.6 oz (127.3 kg)     Physical Exam Constitutional:      General: He is not in acute distress.    Appearance: He is well-developed. He is obese. He is ill-appearing. He is  not toxic-appearing.  HENT:     Head: Normocephalic and atraumatic.     Right Ear: External ear normal.     Left Ear: External ear normal.     Nose: Nose normal.  Eyes:     Conjunctiva/sclera: Conjunctivae normal.     Pupils: Pupils are equal, round, and reactive to light.  Neck:     Musculoskeletal: Normal range of motion and neck supple.  Cardiovascular:     Rate and Rhythm: Normal rate and regular rhythm.     Heart sounds: Normal heart sounds. No murmur.  Pulmonary:     Effort: Pulmonary effort is normal. No respiratory distress.     Breath sounds: Wheezing present. No rhonchi or rales.  Abdominal:     Palpations: Abdomen is soft.     Tenderness: There is no abdominal tenderness.  Musculoskeletal: Normal range of motion.  Skin:    General: Skin is warm and dry.  Neurological:     Mental Status: He is alert and oriented to person, place, and time.     Deep Tendon Reflexes: Reflexes are normal and symmetric.  Psychiatric:        Behavior: Behavior normal.        Thought Content: Thought  content normal.        Judgment: Judgment normal.       Assessment & Plan:   Shahab was seen today for elevated bs.  Diagnoses and all orders for this visit:  Type 2 diabetes mellitus with diabetic neuropathy, with long-term current use of insulin (HCC)  Chronic respiratory failure with hypoxia (HCC)  COPD mixed type ([place])  Other orders -     insulin NPH Human (NOVOLIN N RELION) 100 UNIT/ML injection; Inject 0.5 mLs (50 Units total) into the skin daily before breakfast AND 0.2 mLs (20 Units total) daily before supper. -     insulin regular (NOVOLIN R) 100 units/mL injection; Inject 0.1 mLs (10 Units total) into the skin 2 (two) times daily before a meal. -     Dulaglutide (TRULICITY) 7.90 WI/0.9BD SOPN; Inject 1 pen into the skin once a week.  Detailed counseling along with handout was given to the patient to emphasize the need for using complex carbohydrates and limiting overall  carbohydrate.  40 minutes was spent with this patient more than one half of which was spent in counseling regarding proper use of insulin and diabetic carbohydrate strategy.    I have changed Zaid B. Vanderford's insulin NPH Human and insulin regular. I am also having him start on Trulicity. Additionally, I am having him maintain his Lancing Device, Clever Chek Auto-Code Voice, Vitamin D3, albuterol, oxymetazoline, ALPRAZolam, apixaban, metFORMIN, metoprolol tartrate, simvastatin, PARoxetine, ipratropium-albuterol, albuterol, furosemide, fluticasone, and Trelegy Ellipta.  Meds ordered this encounter  Medications   insulin NPH Human (NOVOLIN N RELION) 100 UNIT/ML injection    Sig: Inject 0.5 mLs (50 Units total) into the skin daily before breakfast AND 0.2 mLs (20 Units total) daily before supper.    Dispense:  50 mL    Refill:  2    Please consider 90 day supplies to promote better adherence   insulin regular (NOVOLIN R) 100 units/mL injection    Sig: Inject 0.1 mLs (10 Units total) into the skin 2 (two) times daily before a meal.    Dispense:  20 mL    Refill:  0   Dulaglutide (TRULICITY) 5.32 DJ/2.4QA SOPN    Sig: Inject 1 pen into the skin once a week.    Dispense:  4 pen    Refill:  3     Follow-up: Return in about 6 weeks (around 12/18/2018), or if symptoms worsen or fail to improve.  [name], M.D.

## 2018-11-07 ENCOUNTER — Telehealth: Payer: Self-pay | Admitting: *Deleted

## 2018-11-07 NOTE — Telephone Encounter (Signed)
PRIOR AUTH SENT ON HUMULIN R 100UNIT/ML SOLUTION  PA Case ID: [id number] - Rx #: [id number]

## 2018-11-07 NOTE — Telephone Encounter (Signed)
HUMULIN R 100UNIT/ML   PA Case: [id number], Status: Approved, Coverage Starts on: 04/25/2018 12:00:00 AM, Coverage Ends on: 04/25/2019 12:00:00 AM.

## 2018-11-08 ENCOUNTER — Telehealth: Payer: Self-pay | Admitting: Internal Medicine

## 2018-11-08 ENCOUNTER — Other Ambulatory Visit: Payer: Self-pay

## 2018-11-08 ENCOUNTER — Ambulatory Visit (HOSPITAL_COMMUNITY)
Admission: RE | Admit: 2018-11-08 | Discharge: 2018-11-08 | Disposition: A | Discharge status: Home / Self Care | Payer: Medicare HMO | Source: Ambulatory Visit | Attending: Internal Medicine | Admitting: Internal Medicine

## 2018-11-08 DIAGNOSIS — R911 Solitary pulmonary nodule: Secondary | ICD-10-CM | POA: Diagnosis not present

## 2018-11-08 DIAGNOSIS — R0602 Shortness of breath: Secondary | ICD-10-CM | POA: Diagnosis not present

## 2018-11-08 MED ORDER — IOHEXOL 350 MG/ML SOLN
100.0000 mL | Freq: Once | INTRAVENOUS | Status: AC | PRN
  Administered 2018-11-08: 100 mL via INTRAVENOUS

## 2018-11-08 NOTE — Telephone Encounter (Signed)
Received Call Report from [place] from [facility]. Per Mel Almond:  IMPRESSION: 1. Technically adequate exam showing no acute pulmonary embolus. 2. Trace LEFT pleural effusion. 3. Coronary artery disease. 4. Cholelithiasis. 5. Question of cirrhosis. 6. 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 7.  Aortic atherosclerosis.  (ICD10-I70.0)   Mel Almond wanted to know if it would be okay for them to let pt go. I had Janeth Rase check with CY and per CY, it was okay for pt to be sent home. Stated to [place] that CY said pt could head home and she verbalized understanding and said that she would have him leave.  Routing the call report to CY as an FYI.

## 2018-11-09 ENCOUNTER — Telehealth: Payer: Self-pay | Admitting: Internal Medicine

## 2018-11-09 DIAGNOSIS — J449 Chronic obstructive pulmonary disease, unspecified: Secondary | ICD-10-CM | POA: Diagnosis not present

## 2018-11-09 NOTE — Telephone Encounter (Signed)
CT scan did not identify any significant nodule, which was our first concern. There is atherosclerosis with calcium deposits in the arteries of the heart.This test can't tell if there is any blockage. There may be some scarring in the liver "cirrhosis". There were no blood clots in the lung.

## 2018-11-09 NOTE — Telephone Encounter (Signed)
Info from message yesterday from call report that we received:  From CY: CT scan did not identify any significant nodule, which was our first concern. There is atherosclerosis with calcium deposits in the arteries of the heart.This test can't tell if there is any blockage. There may be some scarring in the liver "cirrhosis". There were no blood clots in the lung.  Called and spoke with pt's wife Justin Ruiz letting her know the results of pt's CTa. Stated to her that they might want to contact PCP in regards to the atherosclerosis and scarring in liver to see if they had any recommendations for pt in regards to that but told her that concerns that CY was looking for did not show up. Justin Ruiz verbalized understanding. Nothing further needed.

## 2018-11-09 NOTE — Telephone Encounter (Signed)
Thank you. Results have been reported to pt's wife (on [place]). Nothing further needed at this time.

## 2018-11-09 NOTE — Telephone Encounter (Signed)
Called pt's wife and told of the results of pt's CTa.

## 2018-11-19 ENCOUNTER — Ambulatory Visit: Payer: Medicare HMO | Admitting: Family Medicine

## 2018-11-19 NOTE — Telephone Encounter (Addendum)
Per the CXR report, pt has been made aware of these results and CT was ordered.

## 2018-11-27 ENCOUNTER — Other Ambulatory Visit: Payer: Self-pay | Admitting: Family Medicine

## 2018-11-27 NOTE — Telephone Encounter (Signed)
Pt has f/u appt with you 12/17/18 can you send in enough to get him through till his appt then. You saw him 11/06/18. Please advise.

## 2018-12-03 ENCOUNTER — Other Ambulatory Visit: Payer: Self-pay | Admitting: Family Medicine

## 2018-12-10 DIAGNOSIS — J449 Chronic obstructive pulmonary disease, unspecified: Secondary | ICD-10-CM | POA: Diagnosis not present

## 2018-12-15 ENCOUNTER — Encounter: Payer: Self-pay | Admitting: Family Medicine

## 2018-12-17 ENCOUNTER — Other Ambulatory Visit: Payer: Self-pay

## 2018-12-17 ENCOUNTER — Ambulatory Visit (INDEPENDENT_AMBULATORY_CARE_PROVIDER_SITE_OTHER): Payer: Medicare HMO | Admitting: Family Medicine

## 2018-12-17 ENCOUNTER — Ambulatory Visit: Payer: Medicare HMO | Admitting: Family Medicine

## 2018-12-17 ENCOUNTER — Other Ambulatory Visit: Payer: Self-pay | Admitting: Family Medicine

## 2018-12-17 ENCOUNTER — Encounter: Payer: Self-pay | Admitting: Family Medicine

## 2018-12-17 VITALS — BP 114/72 | HR 95 | Temp 98.0°F | Ht 69.0 in | Wt 286.0 lb

## 2018-12-17 DIAGNOSIS — E119 Type 2 diabetes mellitus without complications: Secondary | ICD-10-CM

## 2018-12-17 DIAGNOSIS — Z794 Long term (current) use of insulin: Secondary | ICD-10-CM | POA: Diagnosis not present

## 2018-12-17 DIAGNOSIS — E11628 Type 2 diabetes mellitus with other skin complications: Secondary | ICD-10-CM | POA: Diagnosis not present

## 2018-12-17 DIAGNOSIS — L089 Local infection of the skin and subcutaneous tissue, unspecified: Secondary | ICD-10-CM | POA: Diagnosis not present

## 2018-12-17 MED ORDER — CEPHALEXIN 500 MG PO CAPS: 500.0000 mg | ORAL_CAPSULE | Freq: Two times a day (BID) | ORAL | 0 refills | Status: DC

## 2018-12-17 MED ORDER — SULFAMETHOXAZOLE-TRIMETHOPRIM 800-160 MG PO TABS: 1.0000 | ORAL_TABLET | Freq: Two times a day (BID) | ORAL | 0 refills | Status: AC

## 2018-12-17 MED ORDER — TRULICITY 0.75 MG/0.5ML ~~LOC~~ SOAJ: 1.0000 "pen " | SUBCUTANEOUS | 3 refills | Status: DC

## 2018-12-17 NOTE — Progress Notes (Signed)
Subjective:  Patient ID: Justin Ruiz, male    DOB: [birth year], 68 y.o.   MRN: [id number]  Patient Care Team: [name], MD as PCP - General (Family Medicine) Curt Bears, MD as Consulting Physician (Oncology) Minus Breeding, MD as Consulting Physician (Cardiology) Deneise Lever, MD as Consulting Physician (Pulmonary Disease) Melina Schools, OD as Consulting Physician (Optometry) Sandford Craze, MD as Referring Physician (Unknown Physician Specialty) Inda Castle, MD (Inactive) as Consulting Physician (Gastroenterology) [facility], [facility] as Consulting Physician (Specialist)   Chief Complaint:  toe pain and infection (right)   HPI: Justin Ruiz is a 68 y.o. male presenting on 12/17/2018 for toe pain and infection (right)   Pt presents today with right great toe swelling, redness, drainage, and toenail abnormality. Pt states his toenail has been abnormal for several weeks. States the redness, swelling, and drainage started several days ago. Pt and wife though he had an in office visit with Dr. Livia Snellen today but was surprised when he called for a virtual visit. He did prescribed Keflex for the diabetic toe infection. Pt and wife wanted to be seen in person, so he was scheduled to see a provider in office. Pt states he was recently prescribed Trulicity but did not start this due to being confused about dosing and what to do about other diabetic medications. Pt and wife state blood sugar continues to run in the two to three hundreds. No confusion, weakness, fatigue, fever, chills, dizziness, or syncope.     Relevant past medical, surgical, family, and social history reviewed and updated as indicated.  Allergies and medications reviewed and updated. Date reviewed: Chart in Epic.   Past Medical History:  Diagnosis Date   Acute diastolic heart failure ([place]) 03/28/2016   Acute encephalopathy 03/28/2016   Acute on chronic respiratory failure with hypoxia (HCC) 12/04/2016     Arthritis    Asthma    Atrial fibrillation ([facility]CC)    on AC   Benign neoplasm of ascending colon 05/05/2014   BPH (benign prostatic hypertrophy)    Chronic anticoagulation 08/20/2012   Chronic respiratory failure with hypoxia ([place]) 11/10/2017   Colon polyps    COPD mixed type ([place]) 03/28/2016   PFT 06/17/14- severe obstructive airways disease with slight response to bronchodilator. 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 CPAP titrated to 16. He required supplemental oxygen at 2 L/ [employer] which he already wears for sleep at home. Weight 338 pounds    Peripheral vascular insufficiency ([place]) 08/02/2017   Shingles    Sleep apnea    CPAP machine is broken   Thrombocytopenia ([place]) 11/23/2016    Past Surgical History:  Procedure Laterality Date   APPENDECTOMY     COLONOSCOPY WITH PROPOFOL N/A 05/05/2014   Procedure:  COLONOSCOPY WITH PROPOFOL;  Surgeon: Inda Castle, MD;  Location: [facility];  Service: Endoscopy;  Laterality: N/A;    Social History   Socioeconomic History   Marital status: Married    Spouse name: Not on file   Number of children: 0   Years of education: 12   Highest education level: Not on file  Occupational History   Occupation: Disabled  Scientist, product/process development strain:  Not on file   Food insecurity    Worry: Not on file    Inability: Not on file   Transportation needs    Medical: Not on file    Non-medical: Not on file  Tobacco Use   Smoking status: Former Smoker    Packs/day: 1.00    Years: 40.00    Pack years: 40.00    Types: Cigarettes    Start date: 12/17/1968    Quit date: 10/18/2018    Years since quitting: 0.1   Smokeless tobacco: Never Used   Tobacco comment: 2 ppd; pt reports smoking 2-3 weeks ago, 5-6 cigs/daily  Substance and Sexual Activity   Alcohol use: No    Alcohol/week: 0.0 standard drinks    Comment: previous   Drug use: No   Sexual activity: Yes  Lifestyle   Physical activity    Days per week: Not on file    Minutes per session: Not on file   Stress: Not on file  Relationships   Social connections    Talks on phone: Not on file    Gets together: Not on file    Attends religious service: Not on file    Active member of club or organization: Not on file    Attends meetings of clubs or organizations: Not on file    Relationship status: Not on file   Intimate partner violence    Fear of current or ex partner: Not on file    Emotionally abused: Not on file    Physically abused: Not on file    Forced sexual activity: Not on file  Other Topics Concern   Not on file  Social History Narrative   Married, disabled Dealer (COPD). Daily caffeine use - 2 cup/day.   Right-handed.    Outpatient Encounter Medications as of 12/17/2018  Medication Sig   albuterol (PROVENTIL) (2.5 MG/3ML) 0.083% nebulizer solution Take 3 mLs (2.5 mg total) by nebulization every 6 (six) hours as needed for wheezing or shortness of breath.   albuterol (VENTOLIN HFA) 108 (90 Base) MCG/ACT inhaler Inhale 2 puffs into the lungs every 6 (six) hours as needed for wheezing or shortness of breath.   ALPRAZolam (XANAX) 0.25 MG tablet TAKE 1 TABLET BY MOUTH THREE TIMES DAILY AS NEEDED FOR ANXIETY   apixaban (ELIQUIS) 5 MG TABS tablet Take 1  tablet (5 mg total) by mouth 2 (two) times daily.   Blood Glucose Monitoring Suppl (CLEVER CHEK AUTO-CODE VOICE) DEVI USE AS DIRECTED   Cholecalciferol (VITAMIN D3) 5000 UNITS TABS Take 1 tablet by mouth every morning.   Fluticasone-Umeclidin-Vilant (TRELEGY ELLIPTA) 100-62.5-25 MCG/INH AEPB Inhale 1 puff into the lungs daily. Rinse mouth well   furosemide (LASIX) 40 MG tablet TAKE 1 TABLET BY MOUTH TWICE DAILY AS DIRECTED   insulin NPH Human (NOVOLIN N RELION) 100 UNIT/ML injection Inject 0.5 mLs (50 Units total) into the skin daily before breakfast AND 0.2 mLs (20 Units total) daily before supper.   insulin regular (NOVOLIN R) 100 units/mL injection Inject 0.1 mLs (10 Units total)  into the skin 2 (two) times daily before a meal.   ipratropium-albuterol (DUONEB) 0.5-2.5 (3) MG/3ML SOLN Take 3 mLs by nebulization every 6 (six) hours as needed.   Lancet Devices (LANCING DEVICE) MISC USE AS DIRECTED   metFORMIN (GLUCOPHAGE) 500 MG tablet TAKE 1 TABLET BY MOUTH IN THE MORNING AND 2 IN THE EVENING   metoprolol tartrate (LOPRESSOR) 100 MG tablet Take 1 tablet by mouth twice daily   PARoxetine (PAXIL) 20 MG tablet Take 1 tablet by mouth once daily   simvastatin (ZOCOR) 40 MG tablet Take 1 tablet by mouth once daily   cephALEXin (KEFLEX) 500 MG capsule Take 1 capsule (500 mg total) by mouth 2 (two) times daily.   Dulaglutide (TRULICITY) 8.32 NV/9.68YO SOPN Inject 1 pen into the skin once a week.   sulfamethoxazole-trimethoprim (BACTRIM DS) 800-160 MG tablet Take 1 tablet by mouth 2 (two) times daily for 10 days.   [DISCONTINUED] Dulaglutide (TRULICITY) 0.60 OK/5.9XH SOPN Inject 1 pen into the skin once a week.   [DISCONTINUED] fluticasone (FLONASE) 50 MCG/ACT nasal spray Place 2 sprays into both nostrils daily.   [DISCONTINUED] oxymetazoline (AFRIN) 0.05 % nasal spray Place 1 spray into both nostrils 2 (two) times daily as needed for congestion.   No facility-administered encounter  medications on file as of 12/17/2018.     Allergies  Allergen Reactions   Avapro [Irbesartan] Other (See Comments)    "doesnt sit right with me"--light headed   Lipitor [Atorvastatin Calcium] Other (See Comments)    Leg pain    Review of Systems  Constitutional: Negative for chills, fatigue and fever.  Respiratory: Positive for cough (intermittent, chronic), shortness of breath (chronic ) and wheezing. Negative for chest tightness.   Cardiovascular: Positive for leg swelling. Negative for chest pain and palpitations.  Endocrine: Negative for polydipsia, polyphagia and polyuria.  Genitourinary: Negative for decreased urine volume and difficulty urinating.  Skin: Positive for color change and wound.  Neurological: Negative for dizziness, syncope, weakness, light-headedness and headaches.  Psychiatric/Behavioral: Negative for confusion.  All other systems reviewed and are negative.       Objective:  BP 114/72    Pulse 95    Temp 98 F (36.7 C)    Ht '5\' 9"'  (1.753 m)    Wt 286 lb (129.7 kg)    BMI 42.23 kg/m    Wt Readings from Last 3 Encounters:  12/17/18 286 lb (129.7 kg)  11/06/18 277 lb 12.8 oz (126 kg)  11/01/18 274 lb 9.6 oz (124.6 kg)    Physical Exam Vitals signs and nursing note reviewed.  Constitutional:      General: He is not in acute distress.    Appearance: Normal appearance. He is obese. He is not ill-appearing, toxic-appearing or diaphoretic.  HENT:     Head: Normocephalic and atraumatic.     Mouth/Throat:     Mouth: Mucous membranes are moist.  Eyes:     Conjunctiva/sclera: Conjunctivae normal.     Pupils: Pupils are equal, round, and reactive to light.  Neck:     Musculoskeletal: Neck supple.  Cardiovascular:     Rate and Rhythm: Normal rate and regular rhythm.     Pulses: Normal pulses.          Dorsalis pedis pulses are 2+ on the right side and 2+ on the left side.       Posterior tibial pulses are 2+ on the right side and 2+ on the left side.      Heart  sounds: Normal heart sounds. No murmur. No friction rub. No gallop.   Pulmonary:     Effort: Pulmonary effort is normal. No respiratory distress.     Comments: O2 dependent due to COPD Abdominal:     General: Bowel sounds are normal.     Palpations: Abdomen is soft.  Musculoskeletal:       Feet:  Feet:     Right foot:     Skin integrity: Erythema and warmth present.     Toenail Condition: Right toenails are abnormally thick and ingrown.  Skin:    General: Skin is warm and dry.     Capillary Refill: Capillary refill takes less than 2 seconds.  Neurological:     General: No focal deficit present.     Mental Status: He is alert and oriented to person, place, and time.  Psychiatric:        Mood and Affect: Mood normal.        Behavior: Behavior normal.        Thought Content: Thought content normal.        Judgment: Judgment normal.     Results for orders placed or performed in visit on 11/05/18  Bayer DCA Hb A1c Waived  Result Value Ref Range   HB A1C (BAYER DCA - WAIVED) >14.0 (H) <7.0 %  Thyroid Panel With TSH  Result Value Ref Range   TSH 5.960 (H) 0.450 - 4.500 uIU/mL   T4, Total 5.2 4.5 - 12.0 ug/dL   T3 Uptake Ratio 27 24 - 39 %   Free Thyroxine Index 1.4 1.2 - 4.9  Vitamin B12  Result Value Ref Range   Vitamin B-12 424 232 - 1,245 pg/mL  Hepatic function panel  Result Value Ref Range   Total Protein 6.5 6.0 - 8.5 g/dL   Albumin 3.8 3.8 - 4.8 g/dL   Bilirubin Total 0.8 0.0 - 1.2 mg/dL   Bilirubin, Direct 0.17 0.00 - 0.40 mg/dL   Alkaline Phosphatase 90 39 - 117 IU/L   AST 12 0 - 40 IU/L   ALT 11 0 - 44 IU/L  VITAMIN D 25 Hydroxy (Vit-D Deficiency, Fractures)  Result Value Ref Range   Vit D, 25-Hydroxy 36.1 30.0 - 100.0 ng/mL  Lipid panel  Result Value Ref Range   Cholesterol, Total 223 (H) 100 - 199 mg/dL   Triglycerides 320 (H) 0 - 149 mg/dL   HDL 44 >39 mg/dL   VLDL Cholesterol Cal 64 (H) 5 - 40 mg/dL   LDL Calculated 115 (H) 0 - 99 mg/dL    Chol/HDL Ratio 5.1 (H) 0.0 - 5.0 ratio  CBC with Differential/Platelet  Result Value Ref Range   WBC 6.2 3.4 - 10.8 x10E3/uL   RBC 4.77 4.14 - 5.80 x10E6/uL   Hemoglobin 14.2 13.0 - 17.7 g/dL   Hematocrit 42.3 37.5 - 51.0 %   MCV 89 79 - 97 fL   MCH 29.8 26.6 - 33.0 pg   MCHC 33.6 31.5 - 35.7 g/dL   RDW 12.2 11.6 - 15.4 %   Platelets 244 150 - 450 x10E3/uL   Neutrophils 55 Not Estab. %   Lymphs 34 Not Estab. %   Monocytes 7 Not Estab. %   Eos 3 Not Estab. %   Basos 1 Not Estab. %   Neutrophils Absolute 3.4 1.4 - 7.0 x10E3/uL   Lymphocytes Absolute 2.1 0.7 - 3.1 x10E3/uL   Monocytes Absolute 0.4 0.1 - 0.9 x10E3/uL   EOS (ABSOLUTE) 0.2 0.0 - 0.4  x10E3/uL   Basophils Absolute 0.1 0.0 - 0.2 x10E3/uL   Immature Granulocytes 0 Not Estab. %   Immature Grans (Abs) 0.0 0.0 - 0.1 x10E3/uL  BMP8+EGFR  Result Value Ref Range   Glucose 257 (H) 65 - 99 mg/dL   BUN 21 8 - 27 mg/dL   Creatinine, Ser 1.12 0.76 - 1.27 mg/dL   GFR calc non Af Amer 68 >59 mL/min/1.73   GFR calc Af Amer 78 >59 mL/min/1.73   BUN/Creatinine Ratio 19 10 - 24   Sodium 135 134 - 144 mmol/L   Potassium 4.3 3.5 - 5.2 mmol/L   Chloride 92 (L) 96 - 106 mmol/L   CO2 28 20 - 29 mmol/L   Calcium 9.7 8.6 - 10.2 mg/dL       Pertinent labs & imaging results that were available during my care of the patient were reviewed by me and considered in my medical decision making.  Assessment & Plan:  Justin Ruiz was seen today for toe pain and infection.  Diagnoses and all orders for this visit:  Diabetic infection of right foot ([place]) Pt had telephone encounter with Dr. Livia Snellen due to right great toe infection. Keflex was ordered. Pt was in office and opted to be seen in person also. Xray not available in office today. Pt declined to go to [facility] for imaging to rule out osteomyelitis. Was able to get appointment with Dr. Irving Shows tomorrow. Pt will see him at 0900 tomorrow. Dr. Irving Shows has imaging in office. Will initiate bactrim along  with keflex. Pt aware of symptoms that require emergent evaluation and treatment. Medications as prescribed, podiatry as scheduled, follow up with PCP as scheduled. Report any new or worsening symptoms.        -    sulfamethoxazole-trimethoprim (BACTRIM DS) 800-160 MG tablet; Take 1 tablet by mouth 2 (two) times daily for 10 days. -     Ambulatory referral to Podiatry  Diabetes mellitus type 2, insulin dependent ([place]) Last A1C greater than 14. Pt was to start once weekly Trulicity. Pt has not started this medication because pt and wife were confused about dosing and what to do with other medications. Will send to pharmacy again today. Pt aware to continue other medications and to add this once weekly. Pt and wife verbalize understanding. Reinforced the importance of medication compliance and diet compliance in efforts to lower blood sugar. Follow up with PCP as scheduled.  -     Dulaglutide (TRULICITY) 5.82 PP/8.9QM SOPN; Inject 1 pen into the skin once a week.     Continue all other maintenance medications.  Follow up plan: Return in about 1 week (around 12/24/2018), or if symptoms worsen or fail to improve, for foot reevaluation.  Continue healthy lifestyle choices, including diet (rich in fruits, vegetables, and lean proteins, and low in salt and simple carbohydrates) and exercise (at least 30 minutes of moderate physical activity daily).  Educational handout given for DM  The above assessment and management plan was discussed with the patient. The patient verbalized understanding of and has agreed to the management plan. Patient is aware to call the clinic if symptoms persist or worsen. Patient is aware when to return to the clinic for a follow-up visit. Patient educated on when it is appropriate to go to the emergency department.   Monia Pouch, FNP-C [facility] [phone] 12/17/18

## 2018-12-17 NOTE — Patient Instructions (Signed)

## 2018-12-18 DIAGNOSIS — L03031 Cellulitis of right toe: Secondary | ICD-10-CM | POA: Diagnosis not present

## 2018-12-25 ENCOUNTER — Ambulatory Visit: Payer: Medicare HMO | Admitting: Family Medicine

## 2018-12-26 ENCOUNTER — Other Ambulatory Visit: Payer: Self-pay | Admitting: Family Medicine

## 2019-01-01 DIAGNOSIS — L03031 Cellulitis of right toe: Secondary | ICD-10-CM | POA: Diagnosis not present

## 2019-01-07 DIAGNOSIS — J9611 Chronic respiratory failure with hypoxia: Secondary | ICD-10-CM | POA: Diagnosis not present

## 2019-01-07 DIAGNOSIS — J449 Chronic obstructive pulmonary disease, unspecified: Secondary | ICD-10-CM | POA: Diagnosis not present

## 2019-01-10 DIAGNOSIS — J449 Chronic obstructive pulmonary disease, unspecified: Secondary | ICD-10-CM | POA: Diagnosis not present

## 2019-01-18 ENCOUNTER — Other Ambulatory Visit: Payer: Self-pay

## 2019-01-21 ENCOUNTER — Encounter: Payer: Self-pay | Admitting: Family Medicine

## 2019-01-21 ENCOUNTER — Other Ambulatory Visit: Payer: Self-pay | Admitting: Family Medicine

## 2019-01-21 ENCOUNTER — Ambulatory Visit (INDEPENDENT_AMBULATORY_CARE_PROVIDER_SITE_OTHER): Payer: Medicare HMO | Admitting: Family Medicine

## 2019-01-21 ENCOUNTER — Other Ambulatory Visit: Payer: Self-pay

## 2019-01-21 DIAGNOSIS — Z794 Long term (current) use of insulin: Secondary | ICD-10-CM

## 2019-01-21 DIAGNOSIS — J449 Chronic obstructive pulmonary disease, unspecified: Secondary | ICD-10-CM | POA: Diagnosis not present

## 2019-01-21 DIAGNOSIS — Z23 Encounter for immunization: Secondary | ICD-10-CM

## 2019-01-21 DIAGNOSIS — E119 Type 2 diabetes mellitus without complications: Secondary | ICD-10-CM

## 2019-01-21 MED ORDER — INSULIN NPH (HUMAN) (ISOPHANE) 100 UNIT/ML ~~LOC~~ SUSP: SUBCUTANEOUS | 5 refills | Status: DC

## 2019-01-21 MED ORDER — INSULIN REGULAR HUMAN 100 UNIT/ML IJ SOLN: INTRAMUSCULAR | 5 refills | Status: DC

## 2019-01-21 NOTE — Progress Notes (Signed)
Subjective:  Patient ID: Justin Ruiz, male    DOB: 1950/10/29  Age: 68 y.o. MRN: [id number]  CC: Diabetes (recheck foot infection )   HPI Justin Ruiz presents forFollow-up of diabetes. Patient checks blood sugar at home.   225- 438 fasting and 230-380 postprandial. Log attached. Patient denies symptoms such as polyuria, polydipsia, excessive hunger, nausea No significant hypoglycemic spells noted. Medications reviewed. Started the trulicity, but forgetful, has moved it forward twice due to forgeting and taking a day late. Taking insulin 40 units of NPH BID and 12 units of regular BID. Wife is very busy she is working a lot.  Pt reports taking them regularly without complication/adverse reaction being reported today.   O2 dependent 3 liters. For COPD. Using the duoneb q 2 hrs at times, but not using the trelegy.  Pt. Is home bound due to COPD, dementia, obesity and diabetes. Wife needs help to care for him. He fell in the bathtub a few days ago. Concern for further falls. REquesting home health.   History Justin Ruiz has a past medical history of Acute diastolic heart failure ([place]) (03/28/2016), Acute encephalopathy (03/28/2016), Acute on chronic respiratory failure with hypoxia ([place]) (12/04/2016), Arthritis, Asthma, Atrial fibrillation ([place]), Benign neoplasm of ascending colon (05/05/2014), BPH (benign prostatic hypertrophy), Chronic anticoagulation (08/20/2012), Chronic respiratory failure with hypoxia ([place]) (11/10/2017), Colon polyps, COPD mixed type ([place]) (03/28/2016), Diabetes mellitus, Diabetic neuropathy, type II diabetes mellitus ([place]) (09/25/2014), Diabetic retinopathy ([place]) (05/28/2017), 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   He has a past surgical history that includes Appendectomy and Colonoscopy with propofol (N/A, 05/05/2014).   His family history includes Aortic aneurysm in his sister; COPD in his sister and sister; Colon cancer in his father; Colon polyps in his father; Diabetes in his maternal grandmother, mother, and sister; Heart attack in his sister; Heart disease in his mother, sister, and sister; Liver cancer in his mother; Prostate cancer in his father; Pulmonary embolism in his sister.He reports that he quit smoking about 3 months ago. His smoking use included cigarettes. He started smoking about 50 years ago. He has a 40.00 pack-year smoking history. He has never used smokeless tobacco. He reports that he does not drink alcohol or use drugs.  Current Outpatient Medications on File Prior to Visit  Medication Sig Dispense Refill   albuterol (PROVENTIL) (2.5 MG/3ML) 0.083% nebulizer solution Take 3 mLs (2.5 mg total) by nebulization every 6 (six) hours as needed for wheezing or shortness of breath. 75 mL 12   albuterol (VENTOLIN HFA) 108 (90 Base) MCG/ACT inhaler Inhale 2 puffs into the lungs every 6 (six) hours as needed for wheezing or shortness of breath. 1 Inhaler 6   ALPRAZolam (XANAX) 0.25 MG tablet TAKE 1 TABLET BY MOUTH THREE TIMES DAILY AS NEEDED FOR ANXIETY 90 tablet 0   apixaban (ELIQUIS) 5 MG TABS tablet Take 1 tablet (5 mg total) by mouth 2 (two) times daily. 60 tablet 3   Blood Glucose Monitoring Suppl (CLEVER CHEK AUTO-CODE VOICE) DEVI USE AS DIRECTED 1 each 2   Cholecalciferol (VITAMIN D3) 5000 UNITS TABS Take 1 tablet by mouth every morning.     Dulaglutide (TRULICITY) [id number] [id number] SOPN Inject 1 pen into the skin  once a week. 4 pen 3   Fluticasone-Umeclidin-Vilant (TRELEGY ELLIPTA) 100-62.5-25 MCG/INH AEPB  Inhale 1 puff into the lungs daily. Rinse mouth well 60 each 12   furosemide (LASIX) 40 MG tablet TAKE 1 TABLET BY MOUTH TWICE DAILY AS DIRECTED 180 tablet 0   ipratropium-albuterol (DUONEB) 0.5-2.5 (3) MG/3ML SOLN Take 3 mLs by nebulization every 6 (six) hours as needed. 1080 mL 3   Lancet Devices (LANCING DEVICE) MISC USE AS DIRECTED 1 each 2   metFORMIN (GLUCOPHAGE) 500 MG tablet TAKE 1 TABLET BY MOUTH IN THE MORNING AND 2 IN THE EVENING 270 tablet 0   metoprolol tartrate (LOPRESSOR) 100 MG tablet Take 1 tablet by mouth twice daily 180 tablet 0   PARoxetine (PAXIL) 20 MG tablet Take 1 tablet by mouth once daily 90 tablet 0   simvastatin (ZOCOR) 40 MG tablet Take 1 tablet by mouth once daily 90 tablet 0   No current facility-administered medications on file prior to visit.     ROS Review of Systems  Constitutional: Negative.   HENT: Negative.   Eyes: Negative for visual disturbance.  Respiratory: Positive for shortness of breath (O2 dependent at 3 L [place]). Negative for cough.   Cardiovascular: Negative for chest pain and leg swelling.  Gastrointestinal: Negative for abdominal pain, diarrhea, nausea and vomiting.  Genitourinary: Negative for difficulty urinating.  Musculoskeletal: Positive for gait problem (poor balance). Negative for arthralgias and myalgias.  Skin: Negative for rash.  Neurological: Negative for headaches.  Psychiatric/Behavioral: Positive for confusion and decreased concentration. Negative for sleep disturbance.    Objective:  BP 100/68    Pulse 98    Temp 97.7 F (36.5 C)    Resp (!) 22    Ht 5\' 9"  (1.753 m)    Wt 284 lb (128.8 kg)    SpO2 93% Comment: no oxygen   BMI 41.94 kg/m   BP Readings from Last 3 Encounters:  01/21/19 100/68  12/17/18 114/72  11/06/18 114/63    Wt Readings from Last 3 Encounters:  01/21/19 284 lb (128.8 kg)  12/17/18 286 lb (129.7 kg)  11/06/18 277 lb 12.8 oz (126 kg)     Physical Exam Constitutional:      General: He  is not in acute distress.    Appearance: He is well-developed. He is obese. He is ill-appearing.  HENT:     Head: Normocephalic and atraumatic.     Right Ear: External ear normal.     Left Ear: External ear normal.     Nose: Nose normal.  Eyes:     Conjunctiva/sclera: Conjunctivae normal.     Pupils: Pupils are equal, round, and reactive to light.  Neck:     Musculoskeletal: Normal range of motion and neck supple.  Cardiovascular:     Rate and Rhythm: Normal rate and regular rhythm.     Heart sounds: Normal heart sounds. No murmur.  Pulmonary:     Effort: Pulmonary effort is normal. No respiratory distress.     Breath sounds: Normal breath sounds. No wheezing or rales.  Abdominal:     Palpations: Abdomen is soft.     Tenderness: There is no abdominal tenderness.  Musculoskeletal:     Comments: In heelchair. Unsteady on feet  Skin:    General: Skin is warm and dry.     Findings: Lesion (minimal  at right great toe nail. No erythema. Nontender. Minimal dried serum at edge of nail.) present.  Neurological:     Mental Status: He  is alert and oriented to person, place, and time.     Deep Tendon Reflexes: Reflexes are normal and symmetric.  Psychiatric:        Behavior: Behavior normal.        Thought Content: Thought content normal.        Judgment: Judgment normal.       Assessment & Plan:   Justin Ruiz was seen today for diabetes.  Diagnoses and all orders for this visit:  Obesity, morbid ([place]) -     Ambulatory referral to [place]  Need for immunization against influenza -     Flu Vaccine QUAD High Dose(Fluad)  Diabetes mellitus type 2, insulin dependent ([place]) -     Ambulatory referral to [place]  COPD mixed type [facility]) -     Ambulatory referral to [place]  Other orders -     insulin regular (NOVOLIN R) 100 units/mL injection; Inject 12 units before breakfast and supper -     insulin NPH Human (NOVOLIN N RELION) 100 UNIT/ML injection; Inject 0.5 mLs (50  Units total) into the skin daily before breakfast AND 0.5 mLs (50 Units total) daily before supper.      I have discontinued [name] B. Hershey's cephALEXin. I have also changed his insulin regular and insulin NPH Human. Additionally, I am having him maintain his Lancing Device, Clever Chek Auto-Code Voice, Vitamin D3, albuterol, apixaban, metoprolol tartrate, simvastatin, ipratropium-albuterol, albuterol, furosemide, Trelegy Ellipta, ALPRAZolam, PARoxetine, Trulicity, and metFORMIN.  Meds ordered this encounter  Medications   insulin regular (NOVOLIN R) 100 units/mL injection    Sig: Inject 12 units before breakfast and supper    Dispense:  10 mL    Refill:  5   insulin NPH Human (NOVOLIN N RELION) 100 UNIT/ML injection    Sig: Inject 0.5 mLs (50 Units total) into the skin daily before breakfast AND 0.5 mLs (50 Units total) daily before supper.    Dispense:  30 mL    Refill:  5    Please consider 90 day supplies to promote better adherence     Follow-up: Return in about 1 month (around 02/20/2019).  [name], M.D.

## 2019-01-22 NOTE — Telephone Encounter (Signed)
Seen yesterday

## 2019-01-25 ENCOUNTER — Other Ambulatory Visit: Payer: Self-pay | Admitting: *Deleted

## 2019-01-25 DIAGNOSIS — E1142 Type 2 diabetes mellitus with diabetic polyneuropathy: Secondary | ICD-10-CM | POA: Diagnosis not present

## 2019-01-25 DIAGNOSIS — I872 Venous insufficiency (chronic) (peripheral): Secondary | ICD-10-CM | POA: Diagnosis not present

## 2019-01-25 DIAGNOSIS — I110 Hypertensive heart disease with heart failure: Secondary | ICD-10-CM | POA: Diagnosis not present

## 2019-01-25 DIAGNOSIS — E1165 Type 2 diabetes mellitus with hyperglycemia: Secondary | ICD-10-CM | POA: Diagnosis not present

## 2019-01-25 DIAGNOSIS — E1151 Type 2 diabetes mellitus with diabetic peripheral angiopathy without gangrene: Secondary | ICD-10-CM | POA: Diagnosis not present

## 2019-01-25 DIAGNOSIS — F0390 Unspecified dementia without behavioral disturbance: Secondary | ICD-10-CM | POA: Diagnosis not present

## 2019-01-25 DIAGNOSIS — I4891 Unspecified atrial fibrillation: Secondary | ICD-10-CM | POA: Diagnosis not present

## 2019-01-25 DIAGNOSIS — I5031 Acute diastolic (congestive) heart failure: Secondary | ICD-10-CM | POA: Diagnosis not present

## 2019-01-25 DIAGNOSIS — J449 Chronic obstructive pulmonary disease, unspecified: Secondary | ICD-10-CM | POA: Diagnosis not present

## 2019-01-25 MED ORDER — BD SWAB SINGLE USE REGULAR PADS: 1.0000 | MEDICATED_PAD | Freq: Four times a day (QID) | 11 refills | Status: DC

## 2019-01-28 ENCOUNTER — Other Ambulatory Visit: Payer: Self-pay | Admitting: *Deleted

## 2019-01-28 DIAGNOSIS — I4891 Unspecified atrial fibrillation: Secondary | ICD-10-CM | POA: Diagnosis not present

## 2019-01-28 DIAGNOSIS — J449 Chronic obstructive pulmonary disease, unspecified: Secondary | ICD-10-CM | POA: Diagnosis not present

## 2019-01-28 DIAGNOSIS — E1151 Type 2 diabetes mellitus with diabetic peripheral angiopathy without gangrene: Secondary | ICD-10-CM | POA: Diagnosis not present

## 2019-01-28 DIAGNOSIS — E1142 Type 2 diabetes mellitus with diabetic polyneuropathy: Secondary | ICD-10-CM | POA: Diagnosis not present

## 2019-01-28 DIAGNOSIS — I110 Hypertensive heart disease with heart failure: Secondary | ICD-10-CM | POA: Diagnosis not present

## 2019-01-28 DIAGNOSIS — I5031 Acute diastolic (congestive) heart failure: Secondary | ICD-10-CM | POA: Diagnosis not present

## 2019-01-28 DIAGNOSIS — F0390 Unspecified dementia without behavioral disturbance: Secondary | ICD-10-CM | POA: Diagnosis not present

## 2019-01-28 DIAGNOSIS — I872 Venous insufficiency (chronic) (peripheral): Secondary | ICD-10-CM | POA: Diagnosis not present

## 2019-01-28 DIAGNOSIS — E1165 Type 2 diabetes mellitus with hyperglycemia: Secondary | ICD-10-CM | POA: Diagnosis not present

## 2019-01-28 MED ORDER — ACCU-CHEK AVIVA PLUS W/DEVICE KIT: PACK | 0 refills | Status: DC

## 2019-01-28 MED ORDER — ACCU-CHEK AVIVA PLUS VI STRP: ORAL_STRIP | 3 refills | Status: DC

## 2019-01-28 MED ORDER — BD SWAB SINGLE USE REGULAR PADS: MEDICATED_PAD | 3 refills | Status: DC

## 2019-01-28 MED ORDER — ACCU-CHEK FASTCLIX LANCETS MISC: 3 refills | Status: DC

## 2019-01-28 NOTE — Telephone Encounter (Signed)
Fax from [facility] from wife Needing new meter & ts,lancets, alcohol swabs

## 2019-01-29 DIAGNOSIS — I4891 Unspecified atrial fibrillation: Secondary | ICD-10-CM | POA: Diagnosis not present

## 2019-01-29 DIAGNOSIS — E1151 Type 2 diabetes mellitus with diabetic peripheral angiopathy without gangrene: Secondary | ICD-10-CM | POA: Diagnosis not present

## 2019-01-29 DIAGNOSIS — I872 Venous insufficiency (chronic) (peripheral): Secondary | ICD-10-CM | POA: Diagnosis not present

## 2019-01-29 DIAGNOSIS — F0390 Unspecified dementia without behavioral disturbance: Secondary | ICD-10-CM | POA: Diagnosis not present

## 2019-01-29 DIAGNOSIS — E1142 Type 2 diabetes mellitus with diabetic polyneuropathy: Secondary | ICD-10-CM | POA: Diagnosis not present

## 2019-01-29 DIAGNOSIS — I5031 Acute diastolic (congestive) heart failure: Secondary | ICD-10-CM | POA: Diagnosis not present

## 2019-01-29 DIAGNOSIS — I110 Hypertensive heart disease with heart failure: Secondary | ICD-10-CM | POA: Diagnosis not present

## 2019-01-29 DIAGNOSIS — J449 Chronic obstructive pulmonary disease, unspecified: Secondary | ICD-10-CM | POA: Diagnosis not present

## 2019-01-29 DIAGNOSIS — E1165 Type 2 diabetes mellitus with hyperglycemia: Secondary | ICD-10-CM | POA: Diagnosis not present

## 2019-01-31 DIAGNOSIS — I4891 Unspecified atrial fibrillation: Secondary | ICD-10-CM | POA: Diagnosis not present

## 2019-01-31 DIAGNOSIS — E1165 Type 2 diabetes mellitus with hyperglycemia: Secondary | ICD-10-CM | POA: Diagnosis not present

## 2019-01-31 DIAGNOSIS — F0390 Unspecified dementia without behavioral disturbance: Secondary | ICD-10-CM | POA: Diagnosis not present

## 2019-01-31 DIAGNOSIS — I110 Hypertensive heart disease with heart failure: Secondary | ICD-10-CM | POA: Diagnosis not present

## 2019-01-31 DIAGNOSIS — I5031 Acute diastolic (congestive) heart failure: Secondary | ICD-10-CM | POA: Diagnosis not present

## 2019-01-31 DIAGNOSIS — E1142 Type 2 diabetes mellitus with diabetic polyneuropathy: Secondary | ICD-10-CM | POA: Diagnosis not present

## 2019-01-31 DIAGNOSIS — I872 Venous insufficiency (chronic) (peripheral): Secondary | ICD-10-CM | POA: Diagnosis not present

## 2019-01-31 DIAGNOSIS — J449 Chronic obstructive pulmonary disease, unspecified: Secondary | ICD-10-CM | POA: Diagnosis not present

## 2019-01-31 DIAGNOSIS — E1151 Type 2 diabetes mellitus with diabetic peripheral angiopathy without gangrene: Secondary | ICD-10-CM | POA: Diagnosis not present

## 2019-02-04 DIAGNOSIS — E1151 Type 2 diabetes mellitus with diabetic peripheral angiopathy without gangrene: Secondary | ICD-10-CM | POA: Diagnosis not present

## 2019-02-04 DIAGNOSIS — J449 Chronic obstructive pulmonary disease, unspecified: Secondary | ICD-10-CM | POA: Diagnosis not present

## 2019-02-04 DIAGNOSIS — I5031 Acute diastolic (congestive) heart failure: Secondary | ICD-10-CM | POA: Diagnosis not present

## 2019-02-04 DIAGNOSIS — I872 Venous insufficiency (chronic) (peripheral): Secondary | ICD-10-CM | POA: Diagnosis not present

## 2019-02-04 DIAGNOSIS — E1165 Type 2 diabetes mellitus with hyperglycemia: Secondary | ICD-10-CM | POA: Diagnosis not present

## 2019-02-04 DIAGNOSIS — I110 Hypertensive heart disease with heart failure: Secondary | ICD-10-CM | POA: Diagnosis not present

## 2019-02-04 DIAGNOSIS — I4891 Unspecified atrial fibrillation: Secondary | ICD-10-CM | POA: Diagnosis not present

## 2019-02-04 DIAGNOSIS — E1142 Type 2 diabetes mellitus with diabetic polyneuropathy: Secondary | ICD-10-CM | POA: Diagnosis not present

## 2019-02-04 DIAGNOSIS — F0390 Unspecified dementia without behavioral disturbance: Secondary | ICD-10-CM | POA: Diagnosis not present

## 2019-02-06 DIAGNOSIS — I4891 Unspecified atrial fibrillation: Secondary | ICD-10-CM | POA: Diagnosis not present

## 2019-02-06 DIAGNOSIS — E1142 Type 2 diabetes mellitus with diabetic polyneuropathy: Secondary | ICD-10-CM | POA: Diagnosis not present

## 2019-02-06 DIAGNOSIS — J9611 Chronic respiratory failure with hypoxia: Secondary | ICD-10-CM | POA: Diagnosis not present

## 2019-02-06 DIAGNOSIS — J449 Chronic obstructive pulmonary disease, unspecified: Secondary | ICD-10-CM | POA: Diagnosis not present

## 2019-02-06 DIAGNOSIS — E1151 Type 2 diabetes mellitus with diabetic peripheral angiopathy without gangrene: Secondary | ICD-10-CM | POA: Diagnosis not present

## 2019-02-06 DIAGNOSIS — I5031 Acute diastolic (congestive) heart failure: Secondary | ICD-10-CM | POA: Diagnosis not present

## 2019-02-06 DIAGNOSIS — E1165 Type 2 diabetes mellitus with hyperglycemia: Secondary | ICD-10-CM | POA: Diagnosis not present

## 2019-02-06 DIAGNOSIS — I110 Hypertensive heart disease with heart failure: Secondary | ICD-10-CM | POA: Diagnosis not present

## 2019-02-06 DIAGNOSIS — I872 Venous insufficiency (chronic) (peripheral): Secondary | ICD-10-CM | POA: Diagnosis not present

## 2019-02-06 DIAGNOSIS — F0390 Unspecified dementia without behavioral disturbance: Secondary | ICD-10-CM | POA: Diagnosis not present

## 2019-02-09 DIAGNOSIS — J449 Chronic obstructive pulmonary disease, unspecified: Secondary | ICD-10-CM | POA: Diagnosis not present

## 2019-02-12 ENCOUNTER — Telehealth: Payer: Self-pay | Admitting: Internal Medicine

## 2019-02-12 MED ORDER — TRELEGY ELLIPTA 100-62.5-25 MCG/INH IN AEPB: 1.0000 | INHALATION_SPRAY | Freq: Every day | RESPIRATORY_TRACT | 12 refills | Status: DC

## 2019-02-12 NOTE — Telephone Encounter (Signed)
Spoke with patient' wife.  She states she tried to pick up trelegy. Looks like last Rx was printed not sent to pharmacy so I resent this into the [facility] in [place].  I shared Dr. Janee Morn recommendations she voiced understanding.  Nothing further needed at this time.

## 2019-02-12 NOTE — Telephone Encounter (Signed)
Ok to ask for a simple face mask for O2, but the oxygen flow rate through his hose won't work very well with a mask. He might do better to pull his nasal cannula down so it flows into his mouth when he is mouth-breathing.  I agree, starting back on Trelegy, but also using the nebulizer machine e very 6 hours if needed, may also help.  Do they need to talk with his PCP about his panic attacks?

## 2019-02-12 NOTE — Telephone Encounter (Signed)
See previous phone note.  Resent order for trelegy

## 2019-02-12 NOTE — Telephone Encounter (Signed)
Called and spoke to patient's wife who is listed on DPR.  She wanted to let Dr. Annamaria Boots know that patient has been having panic attacks and oxygen has been dropping during those. The lowest it got was to 83% and at that time wife called EMS.  Patient was able to recover with deep breathing.  Wife stated that patient has been using nebulized Duoneb as his maintaince medication 3 times a day. Reviewed with her that patient is supposed to be using the Trelegy every day and the Duoneb as needed. Wife shared that she did not realize her husband was supposed to continue the Trelegy but that she would pick up from pharmacy and have him start using that daily.  Patient's wife also stated that when patient panics and has trouble catching his breath that he breathes through his mouth and is wondering if they may also have an order for a face mask for the oxygen.  Patient has been using 3.5L via nasal cannula. Patient has been sitting more only sleeps in recliner. Patient's O2 saturations most the time are in the 90's per wife.  Dr. Annamaria Boots please advise. Thank you!   Allergies  Allergen Reactions  . Avapro [Irbesartan] Other (See Comments)    "doesnt sit right with me"--light headed  . Lipitor [Atorvastatin Calcium] Other (See Comments)    Leg pain    Current Outpatient Medications on File Prior to Visit  Medication Sig Dispense Refill  . Accu-Chek FastClix Lancets MISC Test BS QID Dx E11.9 402 each 3  . albuterol (PROVENTIL) (2.5 MG/3ML) 0.083% nebulizer solution Take 3 mLs (2.5 mg total) by nebulization every 6 (six) hours as needed for wheezing or shortness of breath. 75 mL 12  . albuterol (VENTOLIN HFA) 108 (90 Base) MCG/ACT inhaler Inhale 2 puffs into the lungs every 6 (six) hours as needed for wheezing or shortness of breath. 1 Inhaler 6  . Alcohol Swabs (B-D SINGLE USE SWABS REGULAR) PADS Test BS QID Dx E11.9 400 each 3  . ALPRAZolam (XANAX) 0.25 MG tablet TAKE 1 TABLET BY MOUTH THREE TIMES DAILY AS  NEEDED FOR ANXIETY 90 tablet 0  . Blood Glucose Monitoring Suppl (ACCU-CHEK AVIVA PLUS) w/Device KIT Test BS QID DX E11.9 1 kit 0  . Cholecalciferol (VITAMIN D3) 5000 UNITS TABS Take 1 tablet by mouth every morning.    . Dulaglutide (TRULICITY) 6.01 UX/3.2TF SOPN Inject 1 pen into the skin once a week. 4 pen 3  . ELIQUIS 5 MG TABS tablet Take 1 tablet by mouth twice daily 60 tablet 0  . Fluticasone-Umeclidin-Vilant (TRELEGY ELLIPTA) 100-62.5-25 MCG/INH AEPB Inhale 1 puff into the lungs daily. Rinse mouth well 60 each 12  . furosemide (LASIX) 40 MG tablet TAKE 1 TABLET BY MOUTH TWICE DAILY AS DIRECTED 180 tablet 0  . glucose blood (ACCU-CHEK AVIVA PLUS) test strip Test BS QID Dx E11.9 400 each 3  . insulin NPH Human (NOVOLIN N RELION) 100 UNIT/ML injection Inject 0.5 mLs (50 Units total) into the skin daily before breakfast AND 0.5 mLs (50 Units total) daily before supper. 30 mL 5  . insulin regular (NOVOLIN R) 100 units/mL injection Inject 12 units before breakfast and supper 10 mL 5  . ipratropium-albuterol (DUONEB) 0.5-2.5 (3) MG/3ML SOLN Take 3 mLs by nebulization every 6 (six) hours as needed. 1080 mL 3  . Lancet Devices (LANCING DEVICE) MISC USE AS DIRECTED 1 each 2  . metFORMIN (GLUCOPHAGE) 500 MG tablet TAKE 1 TABLET BY MOUTH IN THE MORNING  AND 2 IN THE EVENING 270 tablet 0  . metoprolol tartrate (LOPRESSOR) 100 MG tablet Take 1 tablet by mouth twice daily 180 tablet 0  . PARoxetine (PAXIL) 20 MG tablet Take 1 tablet by mouth once daily 90 tablet 0  . simvastatin (ZOCOR) 40 MG tablet Take 1 tablet by mouth once daily 90 tablet 0   No current facility-administered medications on file prior to visit.

## 2019-02-13 ENCOUNTER — Other Ambulatory Visit: Payer: Self-pay

## 2019-02-13 ENCOUNTER — Ambulatory Visit (INDEPENDENT_AMBULATORY_CARE_PROVIDER_SITE_OTHER): Payer: Medicare HMO

## 2019-02-13 DIAGNOSIS — D472 Monoclonal gammopathy: Secondary | ICD-10-CM

## 2019-02-13 DIAGNOSIS — I110 Hypertensive heart disease with heart failure: Secondary | ICD-10-CM | POA: Diagnosis not present

## 2019-02-13 DIAGNOSIS — Z794 Long term (current) use of insulin: Secondary | ICD-10-CM

## 2019-02-13 DIAGNOSIS — Z6841 Body Mass Index (BMI) 40.0 and over, adult: Secondary | ICD-10-CM

## 2019-02-13 DIAGNOSIS — Z9181 History of falling: Secondary | ICD-10-CM

## 2019-02-13 DIAGNOSIS — D696 Thrombocytopenia, unspecified: Secondary | ICD-10-CM

## 2019-02-13 DIAGNOSIS — I4891 Unspecified atrial fibrillation: Secondary | ICD-10-CM

## 2019-02-13 DIAGNOSIS — F0390 Unspecified dementia without behavioral disturbance: Secondary | ICD-10-CM | POA: Diagnosis not present

## 2019-02-13 DIAGNOSIS — J9621 Acute and chronic respiratory failure with hypoxia: Secondary | ICD-10-CM

## 2019-02-13 DIAGNOSIS — Z7982 Long term (current) use of aspirin: Secondary | ICD-10-CM

## 2019-02-13 DIAGNOSIS — F329 Major depressive disorder, single episode, unspecified: Secondary | ICD-10-CM

## 2019-02-13 DIAGNOSIS — E1151 Type 2 diabetes mellitus with diabetic peripheral angiopathy without gangrene: Secondary | ICD-10-CM

## 2019-02-13 DIAGNOSIS — I872 Venous insufficiency (chronic) (peripheral): Secondary | ICD-10-CM | POA: Diagnosis not present

## 2019-02-13 DIAGNOSIS — E1165 Type 2 diabetes mellitus with hyperglycemia: Secondary | ICD-10-CM

## 2019-02-13 DIAGNOSIS — M1990 Unspecified osteoarthritis, unspecified site: Secondary | ICD-10-CM

## 2019-02-13 DIAGNOSIS — K219 Gastro-esophageal reflux disease without esophagitis: Secondary | ICD-10-CM

## 2019-02-13 DIAGNOSIS — R918 Other nonspecific abnormal finding of lung field: Secondary | ICD-10-CM

## 2019-02-13 DIAGNOSIS — J449 Chronic obstructive pulmonary disease, unspecified: Secondary | ICD-10-CM

## 2019-02-13 DIAGNOSIS — Z9049 Acquired absence of other specified parts of digestive tract: Secondary | ICD-10-CM

## 2019-02-13 DIAGNOSIS — G9340 Encephalopathy, unspecified: Secondary | ICD-10-CM

## 2019-02-13 DIAGNOSIS — B029 Zoster without complications: Secondary | ICD-10-CM

## 2019-02-13 DIAGNOSIS — Z8572 Personal history of non-Hodgkin lymphomas: Secondary | ICD-10-CM

## 2019-02-13 DIAGNOSIS — E1142 Type 2 diabetes mellitus with diabetic polyneuropathy: Secondary | ICD-10-CM

## 2019-02-13 DIAGNOSIS — Z87891 Personal history of nicotine dependence: Secondary | ICD-10-CM

## 2019-02-13 DIAGNOSIS — R590 Localized enlarged lymph nodes: Secondary | ICD-10-CM

## 2019-02-13 DIAGNOSIS — I5031 Acute diastolic (congestive) heart failure: Secondary | ICD-10-CM | POA: Diagnosis not present

## 2019-02-13 DIAGNOSIS — F419 Anxiety disorder, unspecified: Secondary | ICD-10-CM

## 2019-02-13 DIAGNOSIS — E11319 Type 2 diabetes mellitus with unspecified diabetic retinopathy without macular edema: Secondary | ICD-10-CM

## 2019-02-13 DIAGNOSIS — D122 Benign neoplasm of ascending colon: Secondary | ICD-10-CM

## 2019-02-13 DIAGNOSIS — E785 Hyperlipidemia, unspecified: Secondary | ICD-10-CM

## 2019-02-13 DIAGNOSIS — Z79899 Other long term (current) drug therapy: Secondary | ICD-10-CM

## 2019-02-13 DIAGNOSIS — G4733 Obstructive sleep apnea (adult) (pediatric): Secondary | ICD-10-CM

## 2019-02-13 DIAGNOSIS — N400 Enlarged prostate without lower urinary tract symptoms: Secondary | ICD-10-CM

## 2019-02-14 DIAGNOSIS — E1165 Type 2 diabetes mellitus with hyperglycemia: Secondary | ICD-10-CM | POA: Diagnosis not present

## 2019-02-14 DIAGNOSIS — E1142 Type 2 diabetes mellitus with diabetic polyneuropathy: Secondary | ICD-10-CM | POA: Diagnosis not present

## 2019-02-14 DIAGNOSIS — F0390 Unspecified dementia without behavioral disturbance: Secondary | ICD-10-CM | POA: Diagnosis not present

## 2019-02-14 DIAGNOSIS — I110 Hypertensive heart disease with heart failure: Secondary | ICD-10-CM | POA: Diagnosis not present

## 2019-02-14 DIAGNOSIS — J449 Chronic obstructive pulmonary disease, unspecified: Secondary | ICD-10-CM | POA: Diagnosis not present

## 2019-02-14 DIAGNOSIS — I4891 Unspecified atrial fibrillation: Secondary | ICD-10-CM | POA: Diagnosis not present

## 2019-02-14 DIAGNOSIS — I5031 Acute diastolic (congestive) heart failure: Secondary | ICD-10-CM | POA: Diagnosis not present

## 2019-02-14 DIAGNOSIS — I872 Venous insufficiency (chronic) (peripheral): Secondary | ICD-10-CM | POA: Diagnosis not present

## 2019-02-14 DIAGNOSIS — E1151 Type 2 diabetes mellitus with diabetic peripheral angiopathy without gangrene: Secondary | ICD-10-CM | POA: Diagnosis not present

## 2019-02-20 ENCOUNTER — Encounter: Payer: Self-pay | Admitting: Family Medicine

## 2019-02-20 ENCOUNTER — Other Ambulatory Visit: Payer: Self-pay

## 2019-02-20 ENCOUNTER — Ambulatory Visit (INDEPENDENT_AMBULATORY_CARE_PROVIDER_SITE_OTHER): Payer: Medicare HMO | Admitting: Family Medicine

## 2019-02-20 VITALS — BP 112/71 | HR 97 | Temp 97.3°F | Resp 20 | Ht 69.0 in | Wt 289.0 lb

## 2019-02-20 DIAGNOSIS — I4891 Unspecified atrial fibrillation: Secondary | ICD-10-CM | POA: Diagnosis not present

## 2019-02-20 DIAGNOSIS — Z125 Encounter for screening for malignant neoplasm of prostate: Secondary | ICD-10-CM | POA: Diagnosis not present

## 2019-02-20 DIAGNOSIS — I110 Hypertensive heart disease with heart failure: Secondary | ICD-10-CM | POA: Diagnosis not present

## 2019-02-20 DIAGNOSIS — Z794 Long term (current) use of insulin: Secondary | ICD-10-CM | POA: Diagnosis not present

## 2019-02-20 DIAGNOSIS — E119 Type 2 diabetes mellitus without complications: Secondary | ICD-10-CM | POA: Diagnosis not present

## 2019-02-20 DIAGNOSIS — J9611 Chronic respiratory failure with hypoxia: Secondary | ICD-10-CM | POA: Diagnosis not present

## 2019-02-20 DIAGNOSIS — F0390 Unspecified dementia without behavioral disturbance: Secondary | ICD-10-CM | POA: Diagnosis not present

## 2019-02-20 DIAGNOSIS — E1142 Type 2 diabetes mellitus with diabetic polyneuropathy: Secondary | ICD-10-CM | POA: Diagnosis not present

## 2019-02-20 DIAGNOSIS — E559 Vitamin D deficiency, unspecified: Secondary | ICD-10-CM

## 2019-02-20 DIAGNOSIS — E1151 Type 2 diabetes mellitus with diabetic peripheral angiopathy without gangrene: Secondary | ICD-10-CM | POA: Diagnosis not present

## 2019-02-20 DIAGNOSIS — I872 Venous insufficiency (chronic) (peripheral): Secondary | ICD-10-CM | POA: Diagnosis not present

## 2019-02-20 DIAGNOSIS — E1165 Type 2 diabetes mellitus with hyperglycemia: Secondary | ICD-10-CM | POA: Diagnosis not present

## 2019-02-20 DIAGNOSIS — I5031 Acute diastolic (congestive) heart failure: Secondary | ICD-10-CM | POA: Diagnosis not present

## 2019-02-20 DIAGNOSIS — J449 Chronic obstructive pulmonary disease, unspecified: Secondary | ICD-10-CM | POA: Diagnosis not present

## 2019-02-20 LAB — BAYER DCA HB A1C WAIVED: HB A1C (BAYER DCA - WAIVED): 9.9 % — ABNORMAL HIGH (ref <7.0)

## 2019-02-20 MED ORDER — TRULICITY 1.5 MG/0.5ML ~~LOC~~ SOAJ: SUBCUTANEOUS | 12 refills | Status: DC

## 2019-02-20 MED ORDER — ALPRAZOLAM 0.25 MG PO TABS: 0.2500 mg | ORAL_TABLET | Freq: Three times a day (TID) | ORAL | 2 refills | Status: DC | PRN

## 2019-02-20 MED ORDER — INSULIN NPH (HUMAN) (ISOPHANE) 100 UNIT/ML ~~LOC~~ SUSP: SUBCUTANEOUS | 5 refills | Status: DC

## 2019-02-20 NOTE — Patient Instructions (Signed)
Until you can get the trulicity, use 15 units of novolog 10 minutes before breakfast, lunch and dinner

## 2019-02-20 NOTE — Progress Notes (Signed)
Subjective:  Patient ID: Justin Ruiz, male    DOB: 01-Feb-1951  Age: 68 y.o. MRN: [id number]  CC: Recheck A1C   HPI NYLE LIMB presents forFollow-up of diabetes. Patient checks blood sugar at home.  Log returned for last 2 weeks shows fasting running 170-312. Trending downward. Post prandial staying in the low 200s.  Patient denies symptoms such as polyuria, polydipsia, excessive hunger, nausea No significant hypoglycemic spells noted. Medications reviewed. Pt reports taking them regularly without complication/adverse reaction being reported today. Cannot afford trulicity due to donut hole. Also running out of money for home oxygen as well. Gets nervous, panicky through the day and then dyspneic as a result. Lorazepam needed to calm these spells.  History Shonte has a past medical history of Acute diastolic heart failure ([place]) (03/28/2016), Acute encephalopathy (03/28/2016), Acute on chronic respiratory failure with hypoxia ([place]) (12/04/2016), Arthritis, Asthma, Atrial fibrillation ([place]), Benign neoplasm of ascending colon (05/05/2014), BPH (benign prostatic hypertrophy), Chronic anticoagulation (08/20/2012), Chronic respiratory failure with hypoxia ([place]) (11/10/2017), Colon polyps, COPD mixed type ([place]) (03/28/2016), Diabetes mellitus, Diabetic neuropathy, type II diabetes mellitus ([place]) (09/25/2014), Diabetic retinopathy ([place]) (05/28/2017), 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   He has a past surgical history that includes Appendectomy and Colonoscopy  with propofol (N/A, 05/05/2014).   His family history includes Aortic aneurysm in his sister; COPD in his sister and sister; Colon cancer in his father; Colon polyps in his father; Diabetes in his maternal grandmother, mother, and sister; Heart attack in his sister; Heart disease in his mother, sister, and sister; Liver cancer in his mother; Prostate cancer in his father; Pulmonary embolism in his sister.He reports that he quit smoking about 4 months ago. His smoking use included cigarettes. He started smoking about 50 years ago. He has a 40.00 pack-year smoking history. He has never used smokeless tobacco. He reports that he does not drink alcohol or use drugs.  Current Outpatient Medications on File Prior to Visit  Medication Sig Dispense Refill  . Accu-Chek FastClix Lancets MISC Test BS QID Dx E11.9 402 each 3  . albuterol (PROVENTIL) (2.5 MG/3ML) 0.083% nebulizer solution Take 3 mLs (2.5 mg total) by nebulization every 6 (six) hours as needed for wheezing or shortness of breath. 75 mL 12  . albuterol (VENTOLIN HFA) 108 (90 Base) MCG/ACT inhaler Inhale 2 puffs into the lungs every 6 (six) hours as needed for wheezing or shortness of breath. 1 Inhaler 6  . Alcohol Swabs (B-D SINGLE USE SWABS REGULAR) PADS Test BS QID Dx E11.9 400 each 3  . Blood Glucose Monitoring Suppl (ACCU-CHEK AVIVA PLUS) w/Device KIT Test BS QID DX E11.9 1 kit 0  . Cholecalciferol (VITAMIN D3) 5000 UNITS TABS Take 1 tablet by mouth every morning.    Marland Kitchen ELIQUIS 5 MG TABS tablet Take 1 tablet by mouth twice daily 60 tablet 0  . Fluticasone-Umeclidin-Vilant (TRELEGY ELLIPTA) 100-62.5-25 MCG/INH AEPB Inhale 1 puff into the lungs daily. Rinse mouth well 60 each 12  . furosemide (LASIX) 40 MG tablet TAKE 1 TABLET BY MOUTH TWICE DAILY AS DIRECTED 180 tablet 0  . glucose blood (ACCU-CHEK AVIVA PLUS) test strip Test BS QID Dx E11.9 400 each 3  . insulin regular (NOVOLIN  R) 100 units/mL injection Inject 12 units before breakfast and  supper 10 mL 5  . Lancet Devices (LANCING DEVICE) MISC USE AS DIRECTED 1 each 2  . metFORMIN (GLUCOPHAGE) 500 MG tablet TAKE 1 TABLET BY MOUTH IN THE MORNING AND 2 IN THE EVENING 270 tablet 0  . metoprolol tartrate (LOPRESSOR) 100 MG tablet Take 1 tablet by mouth twice daily 180 tablet 0  . PARoxetine (PAXIL) 20 MG tablet Take 1 tablet by mouth once daily 90 tablet 0  . simvastatin (ZOCOR) 40 MG tablet Take 1 tablet by mouth once daily 90 tablet 0  . ipratropium-albuterol (DUONEB) 0.5-2.5 (3) MG/3ML SOLN Take 3 mLs by nebulization every 6 (six) hours as needed. 1080 mL 3   No current facility-administered medications on file prior to visit.     ROS Review of Systems  Constitutional: Negative.   HENT: Negative.   Eyes: Negative for visual disturbance.  Respiratory: Negative for cough and shortness of breath.   Cardiovascular: Negative for chest pain and leg swelling.  Gastrointestinal: Negative for abdominal pain, diarrhea, nausea and vomiting.  Genitourinary: Negative for difficulty urinating.  Musculoskeletal: Negative for arthralgias and myalgias.  Skin: Negative for rash.  Neurological: Negative for headaches.  Psychiatric/Behavioral: Negative for sleep disturbance.    Objective:  BP 112/71   Pulse 97   Temp (!) 97.3 F (36.3 C) (Temporal)   Resp 20   Ht '5\' 9"'  (1.753 m)   Wt 289 lb (131.1 kg)   SpO2 100%   BMI 42.68 kg/m   BP Readings from Last 3 Encounters:  02/20/19 112/71  01/21/19 100/68  12/17/18 114/72    Wt Readings from Last 3 Encounters:  02/20/19 289 lb (131.1 kg)  01/21/19 284 lb (128.8 kg)  12/17/18 286 lb (129.7 kg)     Physical Exam Constitutional:      General: He is not in acute distress.    Appearance: He is well-developed.  HENT:     Head: Normocephalic and atraumatic.     Right Ear: External ear normal.     Left Ear: External ear normal.     Nose: Nose normal.  Eyes:     Conjunctiva/sclera: Conjunctivae normal.     Pupils: Pupils are  equal, round, and reactive to light.  Neck:     Musculoskeletal: Normal range of motion and neck supple.  Cardiovascular:     Rate and Rhythm: Normal rate and regular rhythm.     Heart sounds: Normal heart sounds. No murmur.  Pulmonary:     Effort: Pulmonary effort is normal. No respiratory distress.     Breath sounds: Normal breath sounds. No wheezing or rales.  Abdominal:     Palpations: Abdomen is soft.     Tenderness: There is no abdominal tenderness.  Musculoskeletal: Normal range of motion.  Skin:    General: Skin is warm and dry.  Neurological:     Mental Status: He is alert and oriented to person, place, and time.     Deep Tendon Reflexes: Reflexes are normal and symmetric.  Psychiatric:        Behavior: Behavior normal.        Thought Content: Thought content normal.        Judgment: Judgment normal.       Assessment & Plan:   Jaxtyn was seen today for recheck a1c.  Diagnoses and all orders for this visit:  Diabetes mellitus type 2, insulin dependent ([place]) -     Bayer DCA Hb  A1c Waived -     Referral to Chronic Care Management Services -     CBC with Differential/Platelet -     CMP14+EGFR -     Lipid panel -     PSA Total (Reflex To Free) -     TSH -     VITAMIN D 25 Hydroxy (Vit-D Deficiency, Fractures)  Chronic respiratory failure with hypoxia (H[facility]) -     Referral to Chronic Care Management Services -     CBC with Differential/Platelet -     CMP14+EGFR -     Lipid panel -     PSA Total (Reflex To Free) -     TSH -     VITAMIN D 25 Hydroxy (Vit-D Deficiency, Fractures)  Vitamin D deficiency -     VITAMIN D 25 Hydroxy (Vit-D Deficiency, Fractures)  Other orders -     Dulaglutide (TRULICITY) 1.5 TD/3.2KG SOPN; Inject content of one pen under the skin weekly -     ALPRAZolam (XANAX) 0.25 MG tablet; Take 1 tablet (0.25 mg total) by mouth 3 (three) times daily as needed. for anxiety -     insulin NPH Human (NOVOLIN N RELION) 100 UNIT/ML injection; Inject  0.55 mLs (55 Units total) into the skin daily before breakfast AND 0.55 mLs (55 Units total) daily before supper.   Until you can get the trulicity, use 15 units of novolog 10 minutes before breakfast, lunch and dinner   I have discontinued [name] B. Braatz's Trulicity. I have also changed his ALPRAZolam and insulin NPH Human. Additionally, I am having him start on Trulicity. Lastly, I am having him maintain his Lancing Device, Vitamin D3, albuterol, simvastatin, ipratropium-albuterol, albuterol, furosemide, PARoxetine, metFORMIN, insulin regular, Eliquis, metoprolol tartrate, B-D SINGLE USE SWABS REGULAR, Accu-Chek Aviva Plus, Accu-Chek Aviva Plus, Accu-Chek FastClix Lancets, and Trelegy Ellipta.  Meds ordered this encounter  Medications  . Dulaglutide (TRULICITY) 1.5 UR/4.2HC SOPN    Sig: Inject content of one pen under the skin weekly    Dispense:  4 pen    Refill:  12  . ALPRAZolam (XANAX) 0.25 MG tablet    Sig: Take 1 tablet (0.25 mg total) by mouth 3 (three) times daily as needed. for anxiety    Dispense:  90 tablet    Refill:  2  . insulin NPH Human (NOVOLIN N RELION) 100 UNIT/ML injection    Sig: Inject 0.55 mLs (55 Units total) into the skin daily before breakfast AND 0.55 mLs (55 Units total) daily before supper.    Dispense:  30 mL    Refill:  5    Please consider 90 day supplies to promote better adherence     Follow-up: Return in about 1 month (around 03/23/2019) for diabetes, telephone visit okay.  [name], M.D.

## 2019-02-21 DIAGNOSIS — J449 Chronic obstructive pulmonary disease, unspecified: Secondary | ICD-10-CM | POA: Diagnosis not present

## 2019-02-21 DIAGNOSIS — I110 Hypertensive heart disease with heart failure: Secondary | ICD-10-CM | POA: Diagnosis not present

## 2019-02-21 DIAGNOSIS — I4891 Unspecified atrial fibrillation: Secondary | ICD-10-CM | POA: Diagnosis not present

## 2019-02-21 DIAGNOSIS — E1142 Type 2 diabetes mellitus with diabetic polyneuropathy: Secondary | ICD-10-CM | POA: Diagnosis not present

## 2019-02-21 DIAGNOSIS — E1165 Type 2 diabetes mellitus with hyperglycemia: Secondary | ICD-10-CM | POA: Diagnosis not present

## 2019-02-21 DIAGNOSIS — F0390 Unspecified dementia without behavioral disturbance: Secondary | ICD-10-CM | POA: Diagnosis not present

## 2019-02-21 DIAGNOSIS — E1151 Type 2 diabetes mellitus with diabetic peripheral angiopathy without gangrene: Secondary | ICD-10-CM | POA: Diagnosis not present

## 2019-02-21 DIAGNOSIS — I5031 Acute diastolic (congestive) heart failure: Secondary | ICD-10-CM | POA: Diagnosis not present

## 2019-02-21 DIAGNOSIS — I872 Venous insufficiency (chronic) (peripheral): Secondary | ICD-10-CM | POA: Diagnosis not present

## 2019-02-21 LAB — CMP14+EGFR
ALT: 11 IU/L (ref 0–44)
AST: 18 IU/L (ref 0–40)
Albumin/Globulin Ratio: 1.1 — ABNORMAL LOW (ref 1.2–2.2)
Albumin: 3.7 g/dL — ABNORMAL LOW (ref 3.8–4.8)
Alkaline Phosphatase: 90 IU/L (ref 39–117)
BUN/Creatinine Ratio: 20 (ref 10–24)
BUN: 27 mg/dL (ref 8–27)
Bilirubin Total: 0.7 mg/dL (ref 0.0–1.2)
CO2: 27 mmol/L (ref 20–29)
Calcium: 9.4 mg/dL (ref 8.6–10.2)
Chloride: 95 mmol/L — ABNORMAL LOW (ref 96–106)
Creatinine, Ser: 1.34 mg/dL — ABNORMAL HIGH (ref 0.76–1.27)
GFR calc Af Amer: 62 mL/min/{1.73_m2} (ref >59)
GFR calc non Af Amer: 54 mL/min/{1.73_m2} — ABNORMAL LOW (ref >59)
Globulin, Total: 3.3 g/dL (ref 1.5–4.5)
Glucose: 185 mg/dL — ABNORMAL HIGH (ref 65–99)
Potassium: 4.3 mmol/L (ref 3.5–5.2)
Sodium: 139 mmol/L (ref 134–144)
Total Protein: 7 g/dL (ref 6.0–8.5)

## 2019-02-21 LAB — LIPID PANEL
Chol/HDL Ratio: 3.8 ratio (ref 0.0–5.0)
Cholesterol, Total: 169 mg/dL (ref 100–199)
HDL: 44 mg/dL (ref >39)
LDL Chol Calc (NIH): 77 mg/dL (ref 0–99)
Triglycerides: 300 mg/dL — ABNORMAL HIGH (ref 0–149)
VLDL Cholesterol Cal: 48 mg/dL — ABNORMAL HIGH (ref 5–40)

## 2019-02-21 LAB — CBC WITH DIFFERENTIAL/PLATELET
Basophils Absolute: 0.1 10*3/uL (ref 0.0–0.2)
Basos: 1 %
EOS (ABSOLUTE): 0.2 10*3/uL (ref 0.0–0.4)
Eos: 2 %
Hematocrit: 40.5 % (ref 37.5–51.0)
Hemoglobin: 13.6 g/dL (ref 13.0–17.7)
Immature Grans (Abs): 0 10*3/uL (ref 0.0–0.1)
Immature Granulocytes: 0 %
Lymphocytes Absolute: 2.4 10*3/uL (ref 0.7–3.1)
Lymphs: 25 %
MCH: 30.2 pg (ref 26.6–33.0)
MCHC: 33.6 g/dL (ref 31.5–35.7)
MCV: 90 fL (ref 79–97)
Monocytes Absolute: 0.7 10*3/uL (ref 0.1–0.9)
Monocytes: 7 %
Neutrophils Absolute: 6.3 10*3/uL (ref 1.4–7.0)
Neutrophils: 65 %
Platelets: 264 10*3/uL (ref 150–450)
RBC: 4.5 x10E6/uL (ref 4.14–5.80)
RDW: 13.8 % (ref 11.6–15.4)
WBC: 9.5 10*3/uL (ref 3.4–10.8)

## 2019-02-21 LAB — TSH: TSH: 3.05 u[IU]/mL (ref 0.450–4.500)

## 2019-02-21 LAB — VITAMIN D 25 HYDROXY (VIT D DEFICIENCY, FRACTURES): Vit D, 25-Hydroxy: 44.3 ng/mL (ref 30.0–100.0)

## 2019-02-21 LAB — PSA TOTAL (REFLEX TO FREE): Prostate Specific Ag, Serum: 1.3 ng/mL (ref 0.0–4.0)

## 2019-02-21 NOTE — Progress Notes (Signed)
Hello Erma,  Your lab result is normal and/or stable.Some minor variations that are not significant are commonly marked abnormal, but do not represent any medical problem for you.  Best regards, Erika Slaby, M.D.

## 2019-02-26 NOTE — Progress Notes (Signed)
HPI Patient presents for routine followup atrial fib.  With a history of COPD and  acute on chronic heart failure with an EF on echo of 50%.   He returns for one year follow up.  He wears oxygen 24/7.  He does fairly well. The patient denies any new symptoms such as chest discomfort, neck or arm discomfort. There has been no new shortness of breath, PND or orthopnea. There have been no reported palpitations, presyncope or syncope.     Allergies  Allergen Reactions  . Avapro [Irbesartan] Other (See Comments)    "doesnt sit right with me"--light headed  . Lipitor [Atorvastatin Calcium] Other (See Comments)    Leg pain    Current Outpatient Medications  Medication Sig Dispense Refill  . albuterol (PROVENTIL) (2.5 MG/3ML) 0.083% nebulizer solution Take 3 mLs (2.5 mg total) by nebulization every 6 (six) hours as needed for wheezing or shortness of breath. 75 mL 12  . albuterol (VENTOLIN HFA) 108 (90 Base) MCG/ACT inhaler Inhale 2 puffs into the lungs every 6 (six) hours as needed for wheezing or shortness of breath. 1 Inhaler 6  . ALPRAZolam (XANAX) 0.25 MG tablet Take 1 tablet (0.25 mg total) by mouth 3 (three) times daily as needed. for anxiety 90 tablet 2  . Cholecalciferol (VITAMIN D3) 5000 UNITS TABS Take 1 tablet by mouth every morning.    . Dulaglutide (TRULICITY) 1.5 JS/4.3IP SOPN Inject content of one pen under the skin weekly 4 pen 12  . ELIQUIS 5 MG TABS tablet Take 1 tablet by mouth twice daily 60 tablet 0  . Fluticasone-Umeclidin-Vilant (TRELEGY ELLIPTA) 100-62.5-25 MCG/INH AEPB Inhale 1 puff into the lungs daily. Rinse mouth well 60 each 12  . furosemide (LASIX) 40 MG tablet TAKE 1 TABLET BY MOUTH TWICE DAILY AS DIRECTED 180 tablet 0  . insulin NPH Human (NOVOLIN N RELION) 100 UNIT/ML injection Inject 0.55 mLs (55 Units total) into the skin daily before breakfast AND 0.55 mLs (55 Units total) daily before supper. 30 mL 5  . insulin regular (NOVOLIN R) 100 units/mL injection  Inject 12 units before breakfast and supper 10 mL 5  . ipratropium-albuterol (DUONEB) 0.5-2.5 (3) MG/3ML SOLN Take 3 mLs by nebulization every 6 (six) hours as needed. 1080 mL 3  . metFORMIN (GLUCOPHAGE) 500 MG tablet TAKE 1 TABLET BY MOUTH IN THE MORNING AND 2 IN THE EVENING 270 tablet 0  . metoprolol tartrate (LOPRESSOR) 100 MG tablet Take 1 tablet by mouth twice daily 180 tablet 0  . PARoxetine (PAXIL) 20 MG tablet Take 1 tablet by mouth once daily 90 tablet 0  . simvastatin (ZOCOR) 40 MG tablet Take 1 tablet by mouth once daily 90 tablet 0  . Accu-Chek FastClix Lancets MISC Test BS QID Dx E11.9 402 each 3  . Alcohol Swabs (B-D SINGLE USE SWABS REGULAR) PADS Test BS QID Dx E11.9 400 each 3  . Blood Glucose Monitoring Suppl (ACCU-CHEK AVIVA PLUS) w/Device KIT Test BS QID DX E11.9 1 kit 0  . glucose blood (ACCU-CHEK AVIVA PLUS) test strip Test BS QID Dx E11.9 400 each 3  . Lancet Devices (LANCING DEVICE) MISC USE AS DIRECTED 1 each 2   No current facility-administered medications for this visit.     Past Medical History:  Diagnosis Date  . Acute diastolic heart failure ([place]) 03/28/2016  . Acute encephalopathy 03/28/2016  . Acute on chronic respiratory failure with hypoxia ([place]) 12/04/2016  . Arthritis   . Asthma   . Atrial  fibrillation ([place])    on AC  . Benign neoplasm of ascending colon 05/05/2014  . BPH (benign prostatic hypertrophy)   . Chronic anticoagulation 08/20/2012  . Chronic respiratory failure with hypoxia ([place]) 11/10/2017  . Colon polyps   . COPD mixed type ([place]) 03/28/2016   PFT 06/17/14- severe obstructive airways disease with slight response to bronchodilator. FVC 2.03/42%, FEV1 1.02/28%, ratio 0.5, TLC 82%, DLCO 59%  . Diabetes mellitus   . Diabetic neuropathy, type II diabetes mellitus ([place]) 09/25/2014  . Diabetic retinopathy ([place]) 05/28/2017  . 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 GERD (gastroesophageal reflux disease)   . HTN  (hypertension)    x 3 years  . Hyperlipidemia   . Lung nodules 03/11/2013  . Mediastinal adenopathy 06/20/2013   CT  & PET 2/15   . Memory loss   . 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 NHL (non-Hodgkin's lymphoma) ([place])    nhl dx 9/11  . Obesity    exogenous  . Obstructive sleep apnea 05/25/2014   NPSG-  04/07/2014-severe obstructive sleep apnea-AHI 75.1 per hour. CPAP titrated to 16. He required supplemental oxygen at 2 L/ [employer] which he already wears for sleep at home. Weight 338 pounds   . Peripheral vascular insufficiency ([place]) 08/02/2017  . Shingles   . Sleep apnea    CPAP machine is broken  . Thrombocytopenia ([place]) 11/23/2016    Past Surgical History:  Procedure Laterality Date  . APPENDECTOMY    . COLONOSCOPY WITH PROPOFOL N/A 05/05/2014   Procedure: COLONOSCOPY WITH PROPOFOL;  Surgeon: Inda Castle, MD;  Location: [facility] ENDOSCOPY;  Service: Endoscopy;  Laterality: N/A;    ROS: As stated in the HPI and negative for all other systems.  PHYSICAL EXAM BP 120/82   Pulse (!) 108   Ht '5\' 9"'  (1.753 m)   Wt 294 lb (133.4 kg)   BMI 43.42 kg/m   GENERAL:  Well appearing NECK:  No jugular venous distention, waveform within normal limits, carotid upstroke brisk and symmetric, no bruits, no thyromegaly LUNGS:  Clear to auscultation bilaterally CHEST:  Unremarkable HEART:  PMI not displaced or sustained,S1 and S2 within normal limits, no S3,  no clicks, no rubs, no murmurs, irregular ABD:  Flat, positive bowel sounds normal in frequency in pitch, no bruits, no rebound, no guarding, no midline pulsatile mass, no hepatomegaly, no splenomegaly EXT:  2 plus pulses throughout, no edema, no cyanosis no clubbing   EKG:  Atrial fibrillation rate 108, axis within normal limits, intervals within normal limits, diffuse T wave flattening. Premature ectopic beats  02/27/2019   Lab Results  Component Value Date   CHOL 169 02/20/2019   TRIG 300 (H) 02/20/2019    HDL 44 02/20/2019   LDLCALC 77 02/20/2019    ASSESSMENT AND PLAN  FIBRILLATION, ATRIAL -   [name] has a CHA2DS2 - VASc score of 2 with a risk of stroke of 2.2%.   He is getting keep an eye on his heart rate with his pulse ox to make sure that it is not typically above 100 at rest.  He does not think it is.  He tolerates anticoagulation.  No change in therapy.   CHRONIC DIASTOLIC HF -  He seems to be euvolemic.  No change in therapy.  HTN -  The blood pressure is well controlled.  No change in therapy.  DM -   His is A1c was 9.9.  It  was 14 most recently but has been down below 8 within the year.  We had a discussion about this and I will defer toStacks, Warren, MD.  He understands the importance of good sugar control and again we talked about diet.   MORBID OBESITY - He had lost 20 pounds in the summer and gained it back.  We talked about that and goals of weight loss strategy.

## 2019-02-27 ENCOUNTER — Other Ambulatory Visit: Payer: Self-pay

## 2019-02-27 ENCOUNTER — Telehealth: Payer: Medicare HMO

## 2019-02-27 ENCOUNTER — Encounter: Payer: Self-pay | Admitting: Cardiology

## 2019-02-27 ENCOUNTER — Ambulatory Visit (INDEPENDENT_AMBULATORY_CARE_PROVIDER_SITE_OTHER): Payer: Medicare HMO | Admitting: Cardiology

## 2019-02-27 VITALS — BP 120/82 | HR 108 | Ht 69.0 in | Wt 294.0 lb

## 2019-02-27 DIAGNOSIS — I110 Hypertensive heart disease with heart failure: Secondary | ICD-10-CM | POA: Diagnosis not present

## 2019-02-27 DIAGNOSIS — E118 Type 2 diabetes mellitus with unspecified complications: Secondary | ICD-10-CM | POA: Diagnosis not present

## 2019-02-27 DIAGNOSIS — I5032 Chronic diastolic (congestive) heart failure: Secondary | ICD-10-CM | POA: Diagnosis not present

## 2019-02-27 DIAGNOSIS — I10 Essential (primary) hypertension: Secondary | ICD-10-CM

## 2019-02-27 DIAGNOSIS — I4821 Permanent atrial fibrillation: Secondary | ICD-10-CM

## 2019-02-27 DIAGNOSIS — I5030 Unspecified diastolic (congestive) heart failure: Secondary | ICD-10-CM | POA: Diagnosis not present

## 2019-02-27 NOTE — Patient Instructions (Signed)
Medication Instructions:  The current medical regimen is effective;  continue present plan and medications.  *If you need a refill on your cardiac medications before your next appointment, please call your pharmacy*  Follow-Up: At [facility], you and your health needs are our priority.  As part of our continuing mission to provide you with exceptional heart care, we have created designated Provider Care Teams.  These Care Teams include your primary Cardiologist (physician) and Advanced Practice Providers (APPs -  Physician Assistants and Nurse Practitioners) who all work together to provide you with the care you need, when you need it.  Your next appointment:   Follow up in 1 year with Dr. Percival Spanish.  You will receive a letter in the mail 2 months before you are due.  Please call us when you receive this letter to schedule your follow up appointment.  Thank you for choosing [facility]!!

## 2019-02-28 ENCOUNTER — Ambulatory Visit: Payer: Medicare HMO | Admitting: *Deleted

## 2019-02-28 DIAGNOSIS — F0390 Unspecified dementia without behavioral disturbance: Secondary | ICD-10-CM | POA: Diagnosis not present

## 2019-02-28 DIAGNOSIS — I110 Hypertensive heart disease with heart failure: Secondary | ICD-10-CM | POA: Diagnosis not present

## 2019-02-28 DIAGNOSIS — E1165 Type 2 diabetes mellitus with hyperglycemia: Secondary | ICD-10-CM | POA: Diagnosis not present

## 2019-02-28 DIAGNOSIS — J449 Chronic obstructive pulmonary disease, unspecified: Secondary | ICD-10-CM | POA: Diagnosis not present

## 2019-02-28 DIAGNOSIS — I5032 Chronic diastolic (congestive) heart failure: Secondary | ICD-10-CM

## 2019-02-28 DIAGNOSIS — I872 Venous insufficiency (chronic) (peripheral): Secondary | ICD-10-CM | POA: Diagnosis not present

## 2019-02-28 DIAGNOSIS — I5031 Acute diastolic (congestive) heart failure: Secondary | ICD-10-CM | POA: Diagnosis not present

## 2019-02-28 DIAGNOSIS — Z794 Long term (current) use of insulin: Secondary | ICD-10-CM

## 2019-02-28 DIAGNOSIS — E1142 Type 2 diabetes mellitus with diabetic polyneuropathy: Secondary | ICD-10-CM | POA: Diagnosis not present

## 2019-02-28 DIAGNOSIS — I4891 Unspecified atrial fibrillation: Secondary | ICD-10-CM | POA: Diagnosis not present

## 2019-02-28 DIAGNOSIS — E1151 Type 2 diabetes mellitus with diabetic peripheral angiopathy without gangrene: Secondary | ICD-10-CM | POA: Diagnosis not present

## 2019-02-28 DIAGNOSIS — I10 Essential (primary) hypertension: Secondary | ICD-10-CM

## 2019-02-28 DIAGNOSIS — E119 Type 2 diabetes mellitus without complications: Secondary | ICD-10-CM

## 2019-03-01 ENCOUNTER — Ambulatory Visit: Payer: Medicare HMO | Admitting: *Deleted

## 2019-03-01 NOTE — Chronic Care Management (AMB) (Signed)
  Chronic Care Management   Initial Outreach Note  03/01/2019 Name: Justin Ruiz MRN: [id number] DOB: 11-24-1950   A second unsuccessful outreach attempt was made today. There was no voicemail setup.   Follow up plan: The care management team will reach out to the patient again over the next 7 days.   [name], BSN, RN-BC Embedded Chronic Care Manager [facility] / [place] Management Direct Dial: [phone]

## 2019-03-01 NOTE — Chronic Care Management (AMB) (Signed)
  Chronic Care Management   Note 02/28/2019 Name: Justin Ruiz MRN: [id number] DOB: [birth year]  An usuccessful initial outreach to Justin Ruiz was made by telephone today. Justin Ruiz is a primary care patient of Dr Livia Snellen and was referred to the embedded CCM Team for care coordination and help with management of his chronic medical conditions. His conditions are hypertension, chronic diastolic heart failure, A-fib, chronic respiratory failure with hypoxia, and diabetes type 2 (insulin dependent).   Chart review was performed in preparation for this call. During his visit with Dr Livia Snellen on 02/20/19 changes were made to his medications to help better manage his diabetes. He will likely need care coordination and care management assistance with these changes.   Follow up plan: CCM team will f/u by telephone again over the next 3 ays.    [name], BSN, RN-BC [place] / [place] Management Direct Dial: [phone]

## 2019-03-03 ENCOUNTER — Other Ambulatory Visit: Payer: Self-pay | Admitting: Family Medicine

## 2019-03-04 ENCOUNTER — Ambulatory Visit: Payer: Self-pay | Admitting: Licensed Clinical Social Worker

## 2019-03-04 DIAGNOSIS — E119 Type 2 diabetes mellitus without complications: Secondary | ICD-10-CM

## 2019-03-04 DIAGNOSIS — E559 Vitamin D deficiency, unspecified: Secondary | ICD-10-CM

## 2019-03-04 DIAGNOSIS — R0602 Shortness of breath: Secondary | ICD-10-CM

## 2019-03-04 DIAGNOSIS — J449 Chronic obstructive pulmonary disease, unspecified: Secondary | ICD-10-CM

## 2019-03-04 DIAGNOSIS — K219 Gastro-esophageal reflux disease without esophagitis: Secondary | ICD-10-CM

## 2019-03-04 DIAGNOSIS — I10 Essential (primary) hypertension: Secondary | ICD-10-CM

## 2019-03-04 NOTE — Patient Instructions (Signed)
Licensed Clinical Social Worker Visit Information  Goals we discussed today:  Goals Addressed            This Visit's Progress   . Client will talk with LCSW in next 30 days to discuss anxiety, stress issues of client (pt-stated)       Current Barriers:  . Anxiety issues . Breathing issues . Irregular meal schedule  Clinical Social Work Clinical Goal(s):  . Client will talk with LCSW in next 30 days to discus anxiety and stress issues of client  Interventions: . Discussed CCM program services with LCSW and RNCM . Encouraged [name] and spouse to call RNCM to discuss nursing issues of client . Discussed anxiety management for client . Discussed relaxation techniques of client (client likes to watch TV, talks with [name], and friend from church    Patient Self Care Activities:  . Attends scheduled medical appointments . Takes medications as prescribed  Plan:    Client to attend scheduled medical appointments LCSW to call client or spouse of client  in next 3 weeks to talk with client about his anxiety and stress symptoms management.  Client or spouse to talk with RNCM as needed to discuss nursing needs  Initial goal documentation         Materials provided: No  Mr. Nuel Dejaynes , spouse, were given information about Chronic Care Management services today including:  1. CCM service includes personalized support from designated clinical staff supervised by his physician, including individualized plan of care and coordination with other care providers 2. 24/7 contact phone numbers for assistance for urgent and routine care needs. 3. Service will only be billed when office clinical staff spend 20 minutes or more in a month to coordinate care. 4. Only one practitioner may furnish and bill the service in a calendar month. 5. The patient may stop CCM services at any time (effective at the end of the month) by phone call to the office staff. 6. The patient will be  responsible for cost sharing (co-pay) of up to 20% of the service fee (after annual deductible is met).  Patient [name], spouse, agreed to services and verbal consent obtained.    Follow up Plan:  LCSW to call client or spouse of client  in next 3 weeks to talk with client about his anxiety and stress symptoms management  The patient/Pauline Onder,spouse, verbalized understanding of instructions provided today and declined a print copy of patient instruction materials.   Norva Riffle.Keysean Savino MSW, LCSW Licensed Clinical Social Worker [facility] Care Management [phone]

## 2019-03-04 NOTE — Chronic Care Management (AMB) (Signed)
Care Management Note   Justin Ruiz is a 68 y.o. year old male who is a primary care patient of Stacks, [name], MD. The CM team was consulted for assistance with chronic disease management and care coordination.   I reached out to [place], by phone today.   [name], spouse, were given information about Chronic Care Management services today including:  1. CCM service includes personalized support from designated clinical staff supervised by his physician, including individualized plan of care and coordination with other care providers 2. 24/7 contact phone numbers for assistance for urgent and routine care needs. 3. Service will only be billed when office clinical staff spend 20 minutes or more in a month to coordinate care. 4. Only one practitioner may furnish and bill the service in a calendar month. 5. The patient may stop CCM services at any time (effective at the end of the month) by phone call to the office staff. 6. The patient will be responsible for cost sharing (co-pay) of up to 20% of the service fee (after annual deductible is met). Patient/Justin Ruiz, spouse, agreed to services and verbal consent obtained.    Review of patient status, including review of consultants reports, relevant laboratory and other test results, and collaboration with appropriate care team members and the patient's provider was performed as part of comprehensive patient evaluation and provision of chronic care management services.   Social Determinants of Health: risk of stress; risk of depression    Chronic Care Management from 03/04/2019 in [place]  PHQ-9 Total Score  6     GAD 7 : Generalized Anxiety Score 03/04/2019  Nervous, Anxious, on Edge 1  Control/stop worrying 0  Worry too much - different things 0  Trouble relaxing 1  Restless 0  Easily annoyed or irritable 0  Afraid - awful might happen 1  Total GAD 7 Score 3   Anxiety Difficulty Somewhat difficult   Medications    Accu-Chek FastClix Lancets MISC    albuterol (PROVENTIL) (2.5 MG/3ML) 0.083% nebulizer solution    albuterol (VENTOLIN HFA) 108 (90 Base) MCG/ACT inhaler    Alcohol Swabs (B-D SINGLE USE SWABS REGULAR) PADS    ALPRAZolam (XANAX) 0.25 MG tablet    Blood Glucose Monitoring Suppl (ACCU-CHEK AVIVA PLUS) w/Device KIT    Cholecalciferol (VITAMIN D3) 5000 UNITS TABS    Dulaglutide (TRULICITY) 1.5 DV/7.6HY SOPN    ELIQUIS 5 MG TABS tablet    Fluticasone-Umeclidin-Vilant (TRELEGY ELLIPTA) 100-62.5-25 MCG/INH AEPB    furosemide (LASIX) 40 MG tablet    glucose blood (ACCU-CHEK AVIVA PLUS) test strip    insulin NPH Human (NOVOLIN N RELION) 100 UNIT/ML injection    insulin regular (NOVOLIN R) 100 units/mL injection    ipratropium-albuterol (DUONEB) 0.5-2.5 (3) MG/3ML SOLN(Expired)    Lancet Devices (LANCING DEVICE) MISC    metFORMIN (GLUCOPHAGE) 500 MG tablet    metoprolol tartrate (LOPRESSOR) 100 MG tablet    PARoxetine (PAXIL) 20 MG tablet    simvastatin (ZOCOR) 40 MG tablet    Goals Addressed            This Visit's Progress   . Client will talk with LCSW in next 30 days to discuss anxiety, stress issues of client (pt-stated)       Current Barriers:  . Anxiety issues . Breathing issues . Irregular meal schedule  Clinical Social Work Clinical Goal(s):  . Client will talk with LCSW in next 30 days to discus anxiety  and stress issues of client  Interventions: . Discussed CCM program services with LCSW and RNCM . Encouraged [name] and spouse to call RNCM to discuss nursing issues of client . Discussed anxiety management for client . Discussed relaxation techniques of client (client likes to watch TV, talks with [name], and friend from church    Patient Self Care Activities:  . Attends scheduled medical appointments . Takes medications as prescribed  Plan:    Client to attend scheduled medical appointments LCSW to call  client or spouse of client  in next 3 weeks to talk with client about his anxiety and stress symptoms management.  Client or spouse to talk with RNCM as needed to discuss nursing needs  Initial goal documentation        Follow Up Plan: LCSW to call client or spouse of client in next 3 weeks to talk with client or spouse about client's anxiety and stress symptoms management.   Norva Riffle.Tymir Terral MSW, LCSW Licensed Clinical Social Worker [facility] Care Management [phone]

## 2019-03-05 ENCOUNTER — Ambulatory Visit: Payer: Medicare HMO | Admitting: *Deleted

## 2019-03-05 DIAGNOSIS — I5032 Chronic diastolic (congestive) heart failure: Secondary | ICD-10-CM

## 2019-03-05 DIAGNOSIS — E119 Type 2 diabetes mellitus without complications: Secondary | ICD-10-CM

## 2019-03-05 NOTE — Chronic Care Management (AMB) (Signed)
  Chronic Care Management   Outreach Note   03/05/2019 Name: [name] MRN: [id number] DOB: [birth year]  Referred by: [name], MD Reason for referral : Chronic Care Management (RN Outreach)   Justin Ruiz is a 68 y.o. year old male who is a primary care patient of Stacks, [name], MD. The CCM team was consulted for assistance with chronic disease management and care coordination needs.    Theadore Nan, LCSW was able to speak with Mr Acton's wife, [name], yesterday and she provided verbal consent for CCM services. I have made 3 unsuccessful attempts to reach Mr Madeja by telephone. I have worked with him in the past and would like to talk with him directly.     Plan  Letter mailed with CCM information and a request for him to contact me at [phone] to discuss our services and how we may be able to assist him.   Will collaborate with LCSW regarding psychosocial issues  Will reach back out to Mr Fulford over the next 30 days and sooner if necessary   [name], BSN, RN-BC [place] / [place] Management Direct Dial: [phone]

## 2019-03-05 NOTE — Patient Instructions (Addendum)
Plan  Letter mailed with CCM information and a request for him to contact me at [phone] to discuss our services and how we may be able to assist him.   Will collaborate with LCSW regarding psychosocial issues  Will reach back out to Mr Schramm over the next 30 days, and sooner if necessary   [name], BSN, RN-BC [place] / [place] Management Direct Dial: [phone]

## 2019-03-06 ENCOUNTER — Ambulatory Visit (INDEPENDENT_AMBULATORY_CARE_PROVIDER_SITE_OTHER): Payer: Medicare HMO | Admitting: *Deleted

## 2019-03-06 DIAGNOSIS — F0390 Unspecified dementia without behavioral disturbance: Secondary | ICD-10-CM | POA: Diagnosis not present

## 2019-03-06 DIAGNOSIS — I4891 Unspecified atrial fibrillation: Secondary | ICD-10-CM

## 2019-03-06 DIAGNOSIS — I5031 Acute diastolic (congestive) heart failure: Secondary | ICD-10-CM | POA: Diagnosis not present

## 2019-03-06 DIAGNOSIS — E119 Type 2 diabetes mellitus without complications: Secondary | ICD-10-CM | POA: Diagnosis not present

## 2019-03-06 DIAGNOSIS — I872 Venous insufficiency (chronic) (peripheral): Secondary | ICD-10-CM | POA: Diagnosis not present

## 2019-03-06 DIAGNOSIS — J449 Chronic obstructive pulmonary disease, unspecified: Secondary | ICD-10-CM | POA: Diagnosis not present

## 2019-03-06 DIAGNOSIS — E1142 Type 2 diabetes mellitus with diabetic polyneuropathy: Secondary | ICD-10-CM | POA: Diagnosis not present

## 2019-03-06 DIAGNOSIS — Z794 Long term (current) use of insulin: Secondary | ICD-10-CM | POA: Diagnosis not present

## 2019-03-06 DIAGNOSIS — E1165 Type 2 diabetes mellitus with hyperglycemia: Secondary | ICD-10-CM | POA: Diagnosis not present

## 2019-03-06 DIAGNOSIS — E1151 Type 2 diabetes mellitus with diabetic peripheral angiopathy without gangrene: Secondary | ICD-10-CM | POA: Diagnosis not present

## 2019-03-06 DIAGNOSIS — I110 Hypertensive heart disease with heart failure: Secondary | ICD-10-CM | POA: Diagnosis not present

## 2019-03-06 NOTE — Chronic Care Management (AMB) (Signed)
Chronic Care Management   Follow Up Note   03/06/2019 Name: Justin Ruiz MRN: [id number] DOB: December 09, 1950  Referred by: [name], MD Reason for referral : Chronic Care Management   Justin Ruiz is a 68 y.o. year old male who is a primary care patient of Stacks, [name], MD. The CCM team was consulted for assistance with chronic disease management and care coordination needs.    Review of patient status, including review of consultants reports, relevant laboratory and other test results, and collaboration with appropriate care team members and the patient's provider was performed as part of comprehensive patient evaluation and provision of chronic care management services.    Initial outreach by [employer]. I talked with Justin Ruiz by telephone regarding Kdyn's medication costs. Trulicity has been $32 but is now $250 since hitting the doughnut hole. This is the 3rd week without it.  Eliquis is $370. He has 3 pills left. He has [employer] but is switching to [facility] in Jan 2021. Explained that we will need to reapply for prescription assistance for 2021.  Outpatient Encounter Medications as of 03/06/2019  Medication Sig  . Accu-Chek FastClix Lancets MISC Test BS QID Dx E11.9  . albuterol (PROVENTIL) (2.5 MG/3ML) 0.083% nebulizer solution Take 3 mLs (2.5 mg total) by nebulization every 6 (six) hours as needed for wheezing or shortness of breath.  Marland Kitchen albuterol (VENTOLIN HFA) 108 (90 Base) MCG/ACT inhaler Inhale 2 puffs into the lungs every 6 (six) hours as needed for wheezing or shortness of breath.  . Alcohol Swabs (B-D SINGLE USE SWABS REGULAR) PADS Test BS QID Dx E11.9  . ALPRAZolam (XANAX) 0.25 MG tablet Take 1 tablet (0.25 mg total) by mouth 3 (three) times daily as needed. for anxiety  . Blood Glucose Monitoring Suppl (ACCU-CHEK AVIVA PLUS) w/Device KIT Test BS QID DX E11.9  . Cholecalciferol (VITAMIN D3) 5000 UNITS TABS Take 1 tablet by mouth every morning.  . Dulaglutide  (TRULICITY) 1.5 RJ/1.8AC SOPN Inject content of one pen under the skin weekly  . ELIQUIS 5 MG TABS tablet Take 1 tablet by mouth twice daily  . Fluticasone-Umeclidin-Vilant (TRELEGY ELLIPTA) 100-62.5-25 MCG/INH AEPB Inhale 1 puff into the lungs daily. Rinse mouth well  . furosemide (LASIX) 40 MG tablet TAKE 1 TABLET BY MOUTH TWICE DAILY AS DIRECTED  . glucose blood (ACCU-CHEK AVIVA PLUS) test strip Test BS QID Dx E11.9  . insulin NPH Human (NOVOLIN N RELION) 100 UNIT/ML injection Inject 0.55 mLs (55 Units total) into the skin daily before breakfast AND 0.55 mLs (55 Units total) daily before supper.  . insulin regular (NOVOLIN R) 100 units/mL injection Inject 12 units before breakfast and supper  . ipratropium-albuterol (DUONEB) 0.5-2.5 (3) MG/3ML SOLN Take 3 mLs by nebulization every 6 (six) hours as needed.  Elmore Guise Devices (LANCING DEVICE) MISC USE AS DIRECTED  . metFORMIN (GLUCOPHAGE) 500 MG tablet TAKE 1 TABLET BY MOUTH IN THE MORNING AND 2 IN THE EVENING  . metoprolol tartrate (LOPRESSOR) 100 MG tablet Take 1 tablet by mouth twice daily  . PARoxetine (PAXIL) 20 MG tablet Take 1 tablet by mouth once daily  . simvastatin (ZOCOR) 40 MG tablet Take 1 tablet by mouth once daily   No facility-administered encounter medications on file as of 03/06/2019.     RN Care Plan  . 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  Marland Kitchen  Chronic Disease Management support and education needs related to diabetes  Nurse Case Manager Clinical Goal(s):  Marland Kitchen Over the next 7 days, patient/family will work with RN Care Manager to find an alternative diabetes medication with a lower cost  Interventions:  . Consulted by Theadore Nan, LCSW regarding patient's high medication cost and request for assistance . Chart reviewed including relevent office notes and labs . Medication list  reviewed and discussed with PCP, Dr Livia Snellen o Recommended switching from Trulicity to [place] that Ozempic samples are plentiful and that [employer] has a patient assistance program that I am familiar with o  Will review dosing options with provider . RN will discuss options with Patient/family . RN will collaborate with PCP to switch over to [place] and provide samples if patient/family agreeable   Patient Self Care Activities:  . Performs ADL's independently . Performs IADL's independently . Wife helps with managing medications and medical conditions   Initial goal documentation      . 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  . Chronic Disease Management support and education needs related to Afib  Nurse Case Manager Clinical Goal(s):  Marland Kitchen Over the next 7 days, patient will verbalize understanding of plan to obtain prescription assistance for Eliquis   Interventions:  . Reviewed medications . Reviewed chart relevant chart notes . Talked with Theadore Nan, LCSW about patient's difficulty paying for Eliquis . Will place referral to Care Guide for assistance with prescription assistance application . Will advise patient of qualification guidelines and referral  Patient Self Care Activities:  . Performs ADL's independently . Performs IADL's independently . Wife assists with medications and health maintenance  Initial goal documentation        Follow-up Plan RN CCM will follow-up with patient/family over the next 48 hrs   [name], BSN, RN-BC [place] / [place] Management Direct Dial: [phone]

## 2019-03-07 ENCOUNTER — Telehealth: Payer: Self-pay

## 2019-03-07 ENCOUNTER — Ambulatory Visit: Payer: Medicare HMO | Admitting: *Deleted

## 2019-03-07 ENCOUNTER — Other Ambulatory Visit: Payer: Self-pay | Admitting: *Deleted

## 2019-03-07 DIAGNOSIS — J449 Chronic obstructive pulmonary disease, unspecified: Secondary | ICD-10-CM

## 2019-03-07 DIAGNOSIS — I4891 Unspecified atrial fibrillation: Secondary | ICD-10-CM

## 2019-03-07 DIAGNOSIS — E119 Type 2 diabetes mellitus without complications: Secondary | ICD-10-CM | POA: Diagnosis not present

## 2019-03-07 DIAGNOSIS — Z794 Long term (current) use of insulin: Secondary | ICD-10-CM

## 2019-03-07 DIAGNOSIS — J9611 Chronic respiratory failure with hypoxia: Secondary | ICD-10-CM

## 2019-03-07 MED ORDER — OZEMPIC (0.25 OR 0.5 MG/DOSE) 2 MG/1.5ML ~~LOC~~ SOPN: 0.5000 mg | PEN_INJECTOR | SUBCUTANEOUS | 0 refills | Status: DC

## 2019-03-07 NOTE — Telephone Encounter (Signed)
03/07/2019 Spoke with patient's wife Tc Alfaro about her husband's PAP application for Eliquis. Emailed application to [employer]. Ambrose Mantle [phone]

## 2019-03-07 NOTE — Patient Instructions (Signed)
Visit Information  Goals Addressed            This Visit's Progress     Patient Stated   . 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  . Chronic Disease Management support and education needs related to diabetes  Nurse Case Manager Clinical Goal(s):   Over the next 2 days, patient will begin Ozempic   Over the next 30 days, patient will self administer Ozempic as once weekly diabetes treatment  Over the next 30 days, patient will f/u with RN Care Manager with any concerns regarding diabetes managemen  Interventions:  . Consulted with Justin Ruiz, PharmD regarding which dose of Ozempic is appropriate to start Justin Ruiz on   o Recommended Ozempic .5mg  with the option to increase to 1mg  . Collaborated with PCP to d/c Trulicity and order Ozempic .5mg  . Consulted with Justin Ruiz clinical staff regarding sample availability.  o 4 pens packaged for patient to pickup . Justin Ruiz notified of sample availability . Verbal education provided. Package inserts available with samples.  . Encourage patient to continue checking and recording blood sugars as directed . Advised to keep f/u appointments and A1C checks  Patient Self Care Activities:  . Performs ADL's independently . Performs IADL's independently . Justin Ruiz helps with managing medications and medical conditions   Please see past updates related to this goal by clicking on the "Past Updates" button in the selected goal       . 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  . Chronic Disease Management support and education needs related to Afib  Nurse Case Manager Clinical Goal(s):  Marland Kitchen Over the next 7 days, patient will verbalize understanding of plan to obtain prescription assistance for  Eliquis   Interventions:  . PAP for eliquis received from Justin Ruiz . Forms printed and put with Ozempic samples for patient/Justin Ruiz to pickup . RN to collaborate with PCP clinical staff to complete provider portion of application and obtain signature . Reminded patient/family to provide income and pharmacy expenditure records  Patient Self Care Activities:  . Performs ADL's independently . Performs IADL's independently . Justin Ruiz assists with medications and health maintenance  Please see past updates related to this goal by clicking on the "Past Updates" button in the selected goal      . Obtain Insuance Coverage of Oxygen (pt-stated)       Current Barriers:  Marland Kitchen Knowledge Deficits related to how to obtain insurance coverage of oxygen . Financial constraints  Nurse Case Manager Clinical Goal(s):  Marland Kitchen Over the next 14 days, patient will work with Consulting civil engineer to obtain insurance coverage for in home oxygen use  Interventions:  . Chart reviewed  . Talked with patient's Justin Ruiz and caregiver, Justin Ruiz by telephone o [employer] was covering 80% of oxygen cost but stopped stating that he has reached his coverage length limit for oxygen . Discussed oxygen usage with Justin Ruiz o 3.5L continuous by nasal canula. Has a concentrator and two tanks . Collaborated with Justin Ruiz [phone] o Discovered billing error on their end. They corrected it while I waited on the phone and are resubmitting claims for prior months that Justin Ruiz was billed. Justin Ruiz can ignore the current bill and he will have a credit on his account for what he has  paid above the required 20%, once the insurance has processed . Updated Justin Ruiz and asked her to let me know if she has any further problems  Patient Self Care Activities:  . Has assistance from Justin Ruiz with medication and health management . Unable to independently manage his healthcare  Initial goal documentation        The patient verbalized understanding of instructions  provided today and declined a print copy of patient instruction materials.   The care management team will reach out to the patient again over the next 7 days.   [name], BSN, RN-BC Embedded Chronic Care Manager [facility] [place] [facility] Direct Dial: [phone]

## 2019-03-07 NOTE — Chronic Care Management (AMB) (Signed)
Chronic Care Management   Follow Up Note   03/07/2019 Name: Justin Ruiz MRN: [id number] DOB: March 24, 1951  Referred by: [name], MD Reason for referral : Chronic Care Management (Care Coordination)   Justin Ruiz is a 68 y.o. year old male who is a primary care patient of Stacks, Justin Gash, MD. The CCM team was consulted for assistance with chronic disease management and care coordination needs.    Review of patient status, including review of consultants reports, relevant laboratory and other test results, and collaboration with appropriate care team members and the patient's provider was performed as part of comprehensive patient evaluation and provision of chronic care management services.    SDOH (Social Determinants of Health) screening performed today: Depression   Stress Physical Activity. See Care Plan for related entries.  I spoke with Justin Ruiz by telephone regarding the Patient Assistance Application for Eliquis, the samples of Ozempic, and Justin Ruiz's oxygen bill at [name].    Outpatient Encounter Medications as of 03/07/2019  Medication Sig  . Accu-Chek FastClix Lancets MISC Test BS QID Dx E11.9  . albuterol (PROVENTIL) (2.5 MG/3ML) 0.083% nebulizer solution Take 3 mLs (2.5 mg total) by nebulization every 6 (six) hours as needed for wheezing or shortness of breath.  Marland Kitchen albuterol (VENTOLIN HFA) 108 (90 Base) MCG/ACT inhaler Inhale 2 puffs into the lungs every 6 (six) hours as needed for wheezing or shortness of breath.  . Alcohol Swabs (B-D SINGLE USE SWABS REGULAR) PADS Test BS QID Dx E11.9  . ALPRAZolam (XANAX) 0.25 MG tablet Take 1 tablet (0.25 mg total) by mouth 3 (three) times daily as needed. for anxiety  . Blood Glucose Monitoring Suppl (ACCU-CHEK AVIVA PLUS) w/Device KIT Test BS QID DX E11.9  . Cholecalciferol (VITAMIN D3) 5000 UNITS TABS Take 1 tablet by mouth every morning.  . Dulaglutide (TRULICITY) 1.5 KD/9.8PJ SOPN Inject content of one pen under the skin  weekly  . ELIQUIS 5 MG TABS tablet Take 1 tablet by mouth twice daily  . Fluticasone-Umeclidin-Vilant (TRELEGY ELLIPTA) 100-62.5-25 MCG/INH AEPB Inhale 1 puff into the lungs daily. Rinse mouth well  . furosemide (LASIX) 40 MG tablet TAKE 1 TABLET BY MOUTH TWICE DAILY AS DIRECTED  . glucose blood (ACCU-CHEK AVIVA PLUS) test strip Test BS QID Dx E11.9  . insulin NPH Human (NOVOLIN N RELION) 100 UNIT/ML injection Inject 0.55 mLs (55 Units total) into the skin daily before breakfast AND 0.55 mLs (55 Units total) daily before supper.  . insulin regular (NOVOLIN R) 100 units/mL injection Inject 12 units before breakfast and supper  . ipratropium-albuterol (DUONEB) 0.5-2.5 (3) MG/3ML SOLN Take 3 mLs by nebulization every 6 (six) hours as needed.  Elmore Guise Devices (LANCING DEVICE) MISC USE AS DIRECTED  . metFORMIN (GLUCOPHAGE) 500 MG tablet TAKE 1 TABLET BY MOUTH IN THE MORNING AND 2 IN THE EVENING  . metoprolol tartrate (LOPRESSOR) 100 MG tablet Take 1 tablet by mouth twice daily  . PARoxetine (PAXIL) 20 MG tablet Take 1 tablet by mouth once daily  . simvastatin (ZOCOR) 40 MG tablet Take 1 tablet by mouth once daily   No facility-administered encounter medications on file as of 03/07/2019.      RN Care Plan   . 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  . Chronic Disease Management support and education needs related to diabetes  Nurse Case Manager Clinical Goal(s):  Over the next 2 days, patient will begin Ozempic   Over the next 30 days, patient will self administer Ozempic as once weekly diabetes treatment  Over the next 30 days, patient will f/u with RN Care Manager with any concerns regarding diabetes managemen  Interventions:  . Consulted with [name], PharmD regarding which dose of Ozempic is appropriate to start Mr Justin Ruiz on   o Recommended  Ozempic .60m with the option to increase to 1269m. Collaborated with PCP to d/c Trulicity and order Ozempic .69m52m Consulted with [name] clinical staff regarding sample availability.  o 4 pens packaged for patient to pickup . Wife notified of sample availability . Verbal education provided. Package inserts available with samples.  . Encourage patient to continue checking and recording blood sugars as directed . Advised to keep f/u appointments and A1C checks  Patient Self Care Activities:  . Performs ADL's independently . Performs IADL's independently . Wife helps with managing medications and medical conditions   Please see past updates related to this goal by clicking on the "Past Updates" button in the selected goal       . Medication Cost Management: Eliquis (pt-stated)       Current Barriers:  . KMarland Kitchenowledge Deficits related to patient prescription assistance . [employer]/video editor. Chronic Disease Management support and education needs related to Afib  Nurse Case Manager Clinical Goal(s):  . OMarland Kitchener the next 7 days, patient will verbalize understanding of plan to obtain prescription assistance for Eliquis   Interventions:  . [facility]is [facility] and put with Ozempic samples for patient/wife to pickup . RN to collaborate with PCP clinical staff to complete provider portion of application and obtain signature . Reminded patient/family to provide income and pharmacy expenditure records  Patient Self Care Activities:  . Performs ADL's independently . Performs IADL's independently . Wife assists with medications and health maintenance  Please see past updates related to this goal by clicking on the "Past Updates" button in the selected goal      . Obtain Insuance Coverage of Oxygen (pt-stated)       Current Barriers:  . KMarland Kitchenowledge Deficits related to how to obtain insurance coverage of oxygen . Financial constraints  Nurse Case Manager  Clinical Goal(s):  . OMarland Kitchener the next 14 days, patient will work with RN Consulting civil engineer obtain insurance coverage for in home oxygen use  Interventions:  . Chart reviewed  . Talked with patient's wife and caregiver, PauVira Agar telephone o [employer] was covering 80% of oxygen cost but stopped stating that he has reached his coverage length limit for oxygen . Discussed oxygen usage with wife o 3.5L continuous by nasal canula. Has a concentrator and two tanks . Collaborated with AprHuey [phone] billing error on their end. They corrected it while I waited on the phone and are resubmitting claims for prior months that [employer] billed. [facility]n ignore the current bill and he will have a credit on his account for what he has paid above the required 20%, once the insurance has processed . Updated PauVira Agard asked her to let me know if she has any further problems  Patient Self Care Activities:  . Has assistance from wife with medication and health management . Unable to independently manage his healthcare  Initial goal documentation          Follow-up Plan The care management team will reach out to the patient again  over the next 7 days.   [name], BSN, RN-BC Embedded Chronic Care Manager [facility] [place] [facility] Direct Dial: [phone]

## 2019-03-07 NOTE — Telephone Encounter (Signed)
Medication change from Trulicity to Ozempic due to cost. Samples will be available for patient pickup.

## 2019-03-09 DIAGNOSIS — J9611 Chronic respiratory failure with hypoxia: Secondary | ICD-10-CM | POA: Diagnosis not present

## 2019-03-09 DIAGNOSIS — J449 Chronic obstructive pulmonary disease, unspecified: Secondary | ICD-10-CM | POA: Diagnosis not present

## 2019-03-11 ENCOUNTER — Ambulatory Visit: Payer: Medicare HMO | Admitting: *Deleted

## 2019-03-11 DIAGNOSIS — I4891 Unspecified atrial fibrillation: Secondary | ICD-10-CM

## 2019-03-11 DIAGNOSIS — E119 Type 2 diabetes mellitus without complications: Secondary | ICD-10-CM

## 2019-03-11 NOTE — Patient Instructions (Signed)
RN CARE PLAN  . 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  . Chronic Disease Management support and education needs related to diabetes  Nurse Case Manager Clinical Goal(s):   Over the next 2 days, patient will begin Ozempic   Over the next 30 days, patient will self administer Ozempic as once weekly diabetes treatment  Over the next 30 days, patient will f/u with RN Care Manager with any concerns regarding diabetes management  Interventions:  . Consulted with [name], PharmD regarding which dose of Ozempic is appropriate to start Mr Brackin on   o Recommended Ozempic .5mg  with the option to increase to 1mg  . Collaborated with PCP to d/c Trulicity and order Ozempic .5mg  . Consulted with [name] clinical staff regarding sample availability.  o 4 pens packaged for patient to pickup . Wife notified of sample availability . Verbal education provided. Package inserts available with samples.  . Encourage patient to continue checking and recording blood sugars as directed . Advised to keep f/u appointments and A1C checks  Patient Self Care Activities:  . Performs ADL's independently . Performs IADL's independently . Wife helps with managing medications and medical conditions   Please see past updates related to this goal by clicking on the "Past Updates" button in the selected goal       . 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  . Chronic Disease Management support and education needs related to Afib  Nurse Case Manager Clinical Goal(s):  Marland Kitchen Over the next 7 days, patient will verbalize understanding of plan to obtain prescription assistance for Eliquis   Interventions:  . PAP for eliquis received from [facility] . Forms  printed and put with Ozempic samples for patient/wife to pickup . RN to collaborate with PCP clinical staff to complete provider portion of application and obtain signature . Reminded patient/family to provide income and pharmacy expenditure records . No formulary alternative for Eliquis. . Checked samples at [facility]: there are a few Xarelto samples. May be enough to last until the beginning of the year.  . RN will consult with Pharm D regarding switch to Xarelto for now.   Patient Self Care Activities:  . Performs ADL's independently . Performs IADL's independently . Wife assists with medications and health maintenance  Please see past updates related to this goal by clicking on the "Past Updates" button in the selected goal         Plan RN will f/u with patient over the next 7 days  [name], BSN, RN-BC [place] [facility] [place] [facility] Direct Dial: [phone]

## 2019-03-11 NOTE — Chronic Care Management (AMB) (Signed)
Chronic Care Management   Follow Up Note   03/11/2019 Name: Justin Ruiz MRN: [id number] DOB: [birth year]  Referred by: [name], MD Reason for referral : Chronic Care Management (Care Coordination)   Justin Ruiz is a 68 y.o. year old male who is a primary care patient of Stacks, [name], MD. The CCM team was consulted for assistance with chronic disease management and care coordination needs.    Review of patient status, including review of consultants reports, relevant laboratory and other test results, and collaboration with appropriate care team members and the patient's provider was performed as part of comprehensive patient evaluation and provision of chronic care management services.     Outpatient Encounter Medications as of 03/11/2019  Medication Sig  . Accu-Chek FastClix Lancets MISC Test BS QID Dx E11.9  . albuterol (PROVENTIL) (2.5 MG/3ML) 0.083% nebulizer solution Take 3 mLs (2.5 mg total) by nebulization every 6 (six) hours as needed for wheezing or shortness of breath.  Marland Kitchen albuterol (VENTOLIN HFA) 108 (90 Base) MCG/ACT inhaler Inhale 2 puffs into the lungs every 6 (six) hours as needed for wheezing or shortness of breath.  . Alcohol Swabs (B-D SINGLE USE SWABS REGULAR) PADS Test BS QID Dx E11.9  . ALPRAZolam (XANAX) 0.25 MG tablet Take 1 tablet (0.25 mg total) by mouth 3 (three) times daily as needed. for anxiety  . Blood Glucose Monitoring Suppl (ACCU-CHEK AVIVA PLUS) w/Device KIT Test BS QID DX E11.9  . Cholecalciferol (VITAMIN D3) 5000 UNITS TABS Take 1 tablet by mouth every morning.  Marland Kitchen ELIQUIS 5 MG TABS tablet Take 1 tablet by mouth twice daily. UNABLE TO AFFORD. Marland KitchenLOOKING INTO PATIENT ASSISTANCE AND/OR ALTERNATIVE  . Fluticasone-Umeclidin-Vilant (TRELEGY ELLIPTA) 100-62.5-25 MCG/INH AEPB Inhale 1 puff into the lungs daily. Rinse mouth well  . furosemide (LASIX) 40 MG tablet TAKE 1 TABLET BY MOUTH TWICE DAILY AS DIRECTED  . glucose blood (ACCU-CHEK AVIVA PLUS)  test strip Test BS QID Dx E11.9  . insulin NPH Human (NOVOLIN N RELION) 100 UNIT/ML injection Inject 0.55 mLs (55 Units total) into the skin daily before breakfast AND 0.55 mLs (55 Units total) daily before supper.  . insulin regular (NOVOLIN R) 100 units/mL injection Inject 12 units before breakfast and supper  . ipratropium-albuterol (DUONEB) 0.5-2.5 (3) MG/3ML SOLN Take 3 mLs by nebulization every 6 (six) hours as needed.  Justin Ruiz Devices (LANCING DEVICE) MISC USE AS DIRECTED  . metFORMIN (GLUCOPHAGE) 500 MG tablet TAKE 1 TABLET BY MOUTH IN THE MORNING AND 2 IN THE EVENING  . metoprolol tartrate (LOPRESSOR) 100 MG tablet Take 1 tablet by mouth twice daily  . PARoxetine (PAXIL) 20 MG tablet Take 1 tablet by mouth once daily  . Semaglutide,0.25 or 0.5MG/DOS, (OZEMPIC, 0.25 OR 0.5 MG/DOSE,) 2 MG/1.5ML SOPN Inject 0.5 mg into the skin once a week.  . simvastatin (ZOCOR) 40 MG tablet Take 1 tablet by mouth once daily   No facility-administered encounter medications on file as of 03/11/2019.     RN CARE PLAN  . 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  . Chronic Disease Management support and education needs related to diabetes  Nurse Case Manager Clinical Goal(s):   Over the next 2 days, patient will begin Ozempic   Over the next 30 days, patient will self administer Ozempic as once weekly diabetes treatment  Over the next 30 days, patient  will f/u with RN Care Manager with any concerns regarding diabetes management  Interventions:  . Consulted with Justin Ruiz, PharmD regarding which dose of Ozempic is appropriate to start Justin Ruiz on   o Recommended Ozempic .46m with the option to increase to 113m. Collaborated with PCP to d/c Trulicity and order Ozempic .38m33m Consulted with Justin Ruiz regarding sample availability.  o 4 pens packaged  for patient to pickup . Wife notified of sample availability . Verbal education provided. Package inserts available with samples.  . Encourage patient to continue checking and recording blood sugars as directed . Advised to keep f/u appointments and A1C checks  Patient Self Care Activities:  . Performs ADL's independently . Performs IADL's independently . Wife helps with managing medications and medical conditions   Please see past updates related to this goal by clicking on the "Past Updates" button in the selected goal       . Medication Cost Management: Eliquis (pt-stated)       Current Barriers:  . KMarland Kitchenowledge Deficits related to patient prescription assistance . [employer]/video editor. Chronic Disease Management support and education needs related to Afib  Nurse Case Manager Clinical Goal(s):  . OMarland Kitchener the next 7 days, patient will verbalize understanding of plan to obtain prescription assistance for Eliquis   Interventions:  . PAP for eliquis received from Justin Ruiz printed and put with Ozempic samples for patient/wife to pickup . RN to collaborate with PCP clinical Ruiz to complete provider portion of application and obtain signature . Reminded patient/family to provide income and pharmacy expenditure records . No formulary alternative for Eliquis. . [facility] May be enough to last until the beginning of the year.  . RN will consult with Pharm D regarding switch to Xarelto for now.   Patient Self Care Activities:  . Performs ADL's independently . Performs IADL's independently . Wife assists with medications and health maintenance  Please see past updates related to this goal by clicking on the "Past Updates" button in the selected goal         Plan RN will f/u with patient over the next 7 days  Justin SicilianSN, RN-BC [facility]  Direct Dial: [phone]

## 2019-03-12 DIAGNOSIS — J449 Chronic obstructive pulmonary disease, unspecified: Secondary | ICD-10-CM | POA: Diagnosis not present

## 2019-03-13 ENCOUNTER — Ambulatory Visit: Payer: Medicare HMO | Admitting: *Deleted

## 2019-03-13 DIAGNOSIS — Z794 Long term (current) use of insulin: Secondary | ICD-10-CM | POA: Diagnosis not present

## 2019-03-13 DIAGNOSIS — J449 Chronic obstructive pulmonary disease, unspecified: Secondary | ICD-10-CM | POA: Diagnosis not present

## 2019-03-13 DIAGNOSIS — E119 Type 2 diabetes mellitus without complications: Secondary | ICD-10-CM | POA: Diagnosis not present

## 2019-03-13 DIAGNOSIS — I4891 Unspecified atrial fibrillation: Secondary | ICD-10-CM | POA: Diagnosis not present

## 2019-03-13 NOTE — Patient Instructions (Signed)
Visit Information  Goals Addressed            This Visit's Progress     Patient Stated   . COPD Medication Management (pt-stated)       Current Barriers:  . Film/video editor.  . Chronic Disease Management support and education needs related to COPD  Nurse Case Manager Clinical Goal(s):  Marland Kitchen Over the next 30 days, patient will work with Consulting civil engineer to obtain affordable COPD management medications  Interventions:  . Discussed current treatment plan . Reviewed medications including Trelegy and albuterol.  o Patient is using much less albuterol since starting Trelegy . RN will see if there are any samples of Trelegy available . Will collaborate with Pharmacy team to obtain PAP forms or discuss more cost effective options for treatment  Patient Self Care Activities:  . Self administers medications as prescribed . Performs ADL's independently . Has help from his wife for medical management  Initial goal documentation     . COMPLETED: 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  . Chronic Disease Management support and education needs related to diabetes  Nurse Case Manager Clinical Goal(s):   Over the next 2 days, patient will begin Ozempic   Over the next 30 days, patient will self administer Ozempic as once weekly diabetes treatment  Over the next 30 days, patient will f/u with RN Care Manager with any concerns regarding diabetes management  Interventions:  . Consulted with Justin Ruiz, PharmD regarding which dose of Ozempic is appropriate to start Justin Ruiz on   o Recommended Ozempic .5mg  with the option to increase to 1mg  . Collaborated with PCP to d/c Trulicity and order Ozempic .5mg  . Consulted with [name] clinical staff regarding sample availability.  o 4 pens packaged for patient to pickup . Wife notified of sample  availability . Verbal education provided. Package inserts available with samples.  . Encourage patient to continue checking and recording blood sugars as directed . Advised to keep f/u appointments and A1C checks  Patient Self Care Activities:  . Performs ADL's independently . Performs IADL's independently . Wife helps with managing medications and medical conditions   Please see past updates related to this goal by clicking on the "Past Updates" button in the selected goal       . 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  . Chronic Disease Management support and education needs related to Afib  Nurse Case Manager Clinical Goal(s):  Marland Kitchen Over the next 7 days, patient will verbalize understanding of plan to obtain prescription assistance for Eliquis   Interventions:  . Talked with patient's wife o They are gathering necessary documents for patient assistance . Advised that I have secured samples of Eliquis and that they should be available within the next 48 hrs . Advised that I will call when samples are ready for pickup  Patient Self Care Activities:  . Performs ADL's independently . Performs IADL's independently . Wife assists with medications and health maintenance  Please see past updates related to this goal by clicking on the "Past Updates" button in the selected goal      . COMPLETED: Obtain Insuance Coverage of Oxygen (pt-stated)       Current Barriers:  Marland Kitchen Knowledge Deficits related to how  to obtain insurance coverage of oxygen . Financial constraints  Nurse Case Manager Clinical Goal(s):  Marland Kitchen Over the next 14 days, patient will work with Consulting civil engineer to obtain insurance coverage for in home oxygen use  Interventions:  . Chart reviewed  . Talked with patient's wife and caregiver, Justin Ruiz by telephone o [employer] was covering 80% of oxygen cost  but stopped stating that he has reached his coverage length limit for oxygen . Discussed oxygen usage with wife o 3.5L continuous by nasal canula. Has a concentrator and two tanks . Collaborated with Huey Romans [phone] o Discovered billing error on their end. They corrected it while I waited on the phone and are resubmitting claims for prior months that Justin Ruiz was billed. Justin Ruiz can ignore the current bill and he will have a credit on his account for what he has paid above the required 20%, once the insurance has processed . Updated Justin Ruiz and asked her to let me know if she has any further problems  Patient Self Care Activities:  . Has assistance from wife with medication and health management . Unable to independently manage his healthcare  Initial goal documentation        The patient verbalized understanding of instructions provided today and declined a print copy of patient instruction materials.   The care management team will reach out to the patient again over the next 2 days.   Justin Ruiz, BSN, RN-BC Embedded Chronic Care Manager [facility] / [place] [facility] Direct Dial: [phone]

## 2019-03-13 NOTE — Chronic Care Management (AMB) (Signed)
Chronic Care Management   Follow Up Note   03/13/2019 Name: Justin Ruiz MRN: [id number] DOB: 08-15-1950  Referred by: [name], MD Reason for referral : Chronic Care Management (RN follow up)   Justin Ruiz is a 68 y.o. year old male who is a primary care patient of Stacks, [name], MD. The CCM team was consulted for assistance with chronic disease management and care coordination needs.    Review of patient status, including review of consultants reports, relevant laboratory and other test results, and collaboration with appropriate care team members and the patient's provider was performed as part of comprehensive patient evaluation and provision of chronic care management services.    I spoke with patient's wife, Justin Ruiz, by telephone today.   Outpatient Encounter Medications as of 03/13/2019  Medication Sig   Accu-Chek FastClix Lancets MISC Test BS QID Dx E11.9   albuterol (PROVENTIL) (2.5 MG/3ML) 0.083% nebulizer solution Take 3 mLs (2.5 mg total) by nebulization every 6 (six) hours as needed for wheezing or shortness of breath.   albuterol (VENTOLIN HFA) 108 (90 Base) MCG/ACT inhaler Inhale 2 puffs into the lungs every 6 (six) hours as needed for wheezing or shortness of breath.   Alcohol Swabs (B-D SINGLE USE SWABS REGULAR) PADS Test BS QID Dx E11.9   ALPRAZolam (XANAX) 0.25 MG tablet Take 1 tablet (0.25 mg total) by mouth 3 (three) times daily as needed. for anxiety   Blood Glucose Monitoring Suppl (ACCU-CHEK AVIVA PLUS) w/Device KIT Test BS QID DX E11.9   Cholecalciferol (VITAMIN D3) 5000 UNITS TABS Take 1 tablet by mouth every morning.   ELIQUIS 5 MG TABS tablet Take 1 tablet by mouth twice daily   Fluticasone-Umeclidin-Vilant (TRELEGY ELLIPTA) 100-62.5-25 MCG/INH AEPB Inhale 1 puff into the lungs daily. Rinse mouth well   furosemide (LASIX) 40 MG tablet TAKE 1 TABLET BY MOUTH TWICE DAILY AS DIRECTED   glucose blood (ACCU-CHEK AVIVA PLUS) test strip Test  BS QID Dx E11.9   insulin NPH Human (NOVOLIN N RELION) 100 UNIT/ML injection Inject 0.55 mLs (55 Units total) into the skin daily before breakfast AND 0.55 mLs (55 Units total) daily before supper.   insulin regular (NOVOLIN R) 100 units/mL injection Inject 12 units before breakfast and supper   ipratropium-albuterol (DUONEB) 0.5-2.5 (3) MG/3ML SOLN Take 3 mLs by nebulization every 6 (six) hours as needed.   Lancet Devices (LANCING DEVICE) MISC USE AS DIRECTED   metFORMIN (GLUCOPHAGE) 500 MG tablet TAKE 1 TABLET BY MOUTH IN THE MORNING AND 2 IN THE EVENING   metoprolol tartrate (LOPRESSOR) 100 MG tablet Take 1 tablet by mouth twice daily   PARoxetine (PAXIL) 20 MG tablet Take 1 tablet by mouth once daily   Semaglutide,0.25 or 0.5MG/DOS, (OZEMPIC, 0.25 OR 0.5 MG/DOSE,) 2 MG/1.5ML SOPN Inject 0.5 mg into the skin once a week.   simvastatin (ZOCOR) 40 MG tablet Take 1 tablet by mouth once daily   No facility-administered encounter medications on file as of 03/13/2019.      RN Care Plan    COPD Medication Management (pt-stated)       Current Barriers:   Film/video editor.   Chronic Disease Management support and education needs related to COPD  Nurse Case Manager Clinical Goal(s):   Over the next 30 days, patient will work with RN Care Manager to obtain affordable COPD management medications  Interventions:   Discussed current treatment plan  Reviewed medications including Trelegy and albuterol.  o Patient is using much less albuterol  since starting Trelegy  RN will see if there are any samples of Trelegy available  Will collaborate with Pharmacy team to obtain PAP forms or discuss more cost effective options for treatment  Patient Self Care Activities:   Self administers medications as prescribed  Performs ADL's independently  Has help from his wife for medical management  Initial goal documentation      COMPLETED: Medication Cost Management: Diabetes  (pt-stated)       Current Barriers:   Knowledge Deficits related to assistance available to decrease medication cost  Financial Constraints.   Chronic Disease Management support and education needs related to diabetes  Nurse Case Manager Clinical Goal(s):   Over the next 2 days, patient will begin Ozempic   Over the next 30 days, patient will self administer Ozempic as once weekly diabetes treatment  Over the next 30 days, patient will f/u with [place] with any concerns regarding diabetes management  Interventions:   Consulted with [name], PharmD regarding which dose of Ozempic is appropriate to start Mr Gains on   o Recommended Ozempic .46m with the option to increase to 171m Collaborated with PCP to d/c Trulicity and order Ozempic .[facility].  o 4 pens packaged for patient to pickup  Wife notified of sample availability  Verbal education provided. Package inserts available with samples.   Encourage patient to continue checking and recording blood sugars as directed  Advised to keep f/u appointments and A1C checks  Patient Self Care Activities:   Performs ADL's independently  Performs IADL's independently  Wife helps with managing medications and medical conditions   Please see past updates related to this goal by clicking on the "Past Updates" button in the selected goal        Medication Cost Management: Eliquis (pt-stated)       Current Barriers:   Knowledge Deficits related to patient prescription assistance  Financial Constraints.   Chronic Disease Management support and education needs related to Afib  Nurse Case Manager Clinical Goal(s):   Over the next 7 days, patient will verbalize understanding of plan to obtain prescription assistance for Eliquis   Interventions:   Talked with patient's wife o They are gathering necessary documents for patient assistance  Advised  that I have secured samples of Eliquis and that they should be available within the next 48 hrs  Advised that I will call when samples are ready for pickup  Patient Self Care Activities:   Performs ADL's independently  Performs IADL's independently  Wife assists with medications and health maintenance  Please see past updates related to this goal by clicking on the "Past Updates" button in the selected goal       COMPLETED: Obtain Insuance Coverage of Oxygen (pt-stated)       Current Barriers:   Knowledge Deficits related to how to obtain insurance coverage of oxygen  Financial constraints  Nurse Case Manager Clinical Goal(s):   Over the next 14 days, patient will work with RN Consulting civil engineer obtain insurance coverage for in home oxygen use  Interventions:   Chart reviewed   Talked with patient's wife and caregiver, PauVira Ruiz telephone o [employer] was covering 80% of oxygen cost but stopped stating that he has reached his coverage length limit for oxygen  Discussed oxygen usage with wife o 3.5L continuous by nasal canula. Has a concentrator and two tanks  Collaborated with AprHuey [phone] billing error on  their end. They corrected it while I waited on the phone and are resubmitting claims for prior months that Marisa was billed. Jahbari can ignore the current bill and he will have a credit on his account for what he has paid above the required 20%, once the insurance has processed  Updated Justin Ruiz and asked her to let me know if she has any further problems  Patient Self Care Activities:   Has assistance from wife with medication and health management  Unable to independently manage his healthcare  Initial goal documentation        Follow-up Plan RN will f/u with patient/wife over the next 2 days.   [name], BSN, RN-BC Embedded Chronic Care Manager [facility] / [place] [facility] Direct Dial: [phone]

## 2019-03-16 ENCOUNTER — Other Ambulatory Visit: Payer: Self-pay | Admitting: Family Medicine

## 2019-03-22 ENCOUNTER — Other Ambulatory Visit: Payer: Self-pay | Admitting: Family Medicine

## 2019-03-26 ENCOUNTER — Encounter: Payer: Self-pay | Admitting: *Deleted

## 2019-03-26 ENCOUNTER — Ambulatory Visit (INDEPENDENT_AMBULATORY_CARE_PROVIDER_SITE_OTHER): Payer: Medicare HMO | Admitting: Family Medicine

## 2019-03-26 ENCOUNTER — Encounter: Payer: Self-pay | Admitting: Family Medicine

## 2019-03-26 DIAGNOSIS — Z794 Long term (current) use of insulin: Secondary | ICD-10-CM

## 2019-03-26 DIAGNOSIS — E119 Type 2 diabetes mellitus without complications: Secondary | ICD-10-CM

## 2019-03-26 MED ORDER — INSULIN NPH (HUMAN) (ISOPHANE) 100 UNIT/ML ~~LOC~~ SUSP: 55.0000 [IU] | Freq: Two times a day (BID) | SUBCUTANEOUS | 5 refills | Status: DC

## 2019-03-26 MED ORDER — OZEMPIC (1 MG/DOSE) 2 MG/1.5ML ~~LOC~~ SOPN: 1.0000 mg | PEN_INJECTOR | SUBCUTANEOUS | 11 refills | Status: DC

## 2019-03-26 MED ORDER — INSULIN REGULAR HUMAN 100 UNIT/ML IJ SOLN: INTRAMUSCULAR | 5 refills | Status: DC

## 2019-03-26 NOTE — Progress Notes (Signed)
Subjective:    Patient ID: Justin Ruiz, male    DOB: 1951/03/18, 68 y.o.   MRN: [id number]   HPI: Justin Ruiz is a 68 y.o. male presenting for presents forFollow-up of diabetes. Patient checks blood sugar at home. Using Trulicity. weekly  274 fasting and 290 postprandial on Nov 26. 270  Patient denies symptoms such as polyuria, polydipsia, excessive hunger, nausea No significant hypoglycemic spells noted. Medications reviewed. Pt reports taking them regularly without complication/adverse reaction being reported today. Wife is giving Ozempic. She isn't sure if the dose is correct (0.25 vs 0.5 mg) She twists the pen to the two dots and a dash. Not sure what that means.     Depression screen [facility] 2/9 03/07/2019 03/04/2019 02/20/2019 01/21/2019 12/17/2018  Decreased Interest 1 1 0 0 0  Down, Depressed, Hopeless 1 1 0 0 0  PHQ - 2 Score 2 2 0 0 0  Altered sleeping 1 1 - - -  Tired, decreased energy 0 1 - - -  Change in appetite 0 0 - - -  Feeling bad or failure about yourself  1 0 - - -  Trouble concentrating 1 1 - - -  Moving slowly or fidgety/restless 0 1 - - -  Suicidal thoughts 3 0 - - -  PHQ-9 Score - 6 - - -  Difficult doing work/chores Somewhat difficult Somewhat difficult - - -  Some recent data might be hidden     Relevant past medical, surgical, family and social history reviewed and updated as indicated.  Interim medical history since our last visit reviewed. Allergies and medications reviewed and updated.  ROS:  Review of Systems  Constitutional: Negative for fever.  Respiratory: Negative for shortness of breath.   Cardiovascular: Negative for chest pain.  Musculoskeletal: Negative for arthralgias.  Skin: Negative for rash.     Social History   Tobacco Use  Smoking Status Former Smoker  . Packs/day: 1.00  . Years: 40.00  . Pack years: 40.00  . Types: Cigarettes  . Start date: 12/17/1968  . Quit date: 10/18/2018  . Years since quitting: 0.4  Smokeless  Tobacco Never Used  Tobacco Comment   2 ppd; pt reports smoking 2-3 weeks ago, 5-6 cigs/daily       Objective:     Wt Readings from Last 3 Encounters:  02/27/19 294 lb (133.4 kg)  02/20/19 289 lb (131.1 kg)  01/21/19 284 lb (128.8 kg)     Exam deferred. Pt. [place] due to COVID 19. Phone visit performed.   Assessment & Plan:   1. Diabetes mellitus type 2, insulin dependent (HCC)   2. Obesity, morbid ([place])     Meds ordered this encounter  Medications  . insulin regular (NOVOLIN R) 100 units/mL injection    Sig: Inject 15 units before breakfast and supper    Dispense:  10 mL    Refill:  5  . insulin NPH Human (NOVOLIN N RELION) 100 UNIT/ML injection    Sig: Inject 0.55 mLs (55 Units total) into the skin 2 (two) times daily before a meal.    Dispense:  30 mL    Refill:  5    Please consider 90 day supplies to promote better adherence  . Semaglutide, 1 MG/DOSE, (OZEMPIC, 1 MG/DOSE,) 2 MG/1.5ML SOPN    Sig: Inject 1 mg into the skin once a week.    Dispense:  2 pen    Refill:  11    No orders of  the defined types were placed in this encounter.     Diagnoses and all orders for this visit:  Diabetes mellitus type 2, insulin dependent ([place])  Obesity, morbid ([place])  Other orders -     insulin regular (NOVOLIN R) 100 units/mL injection; Inject 15 units before breakfast and supper -     insulin NPH Human (NOVOLIN N RELION) 100 UNIT/ML injection; Inject 0.55 mLs (55 Units total) into the skin 2 (two) times daily before a meal. -     Semaglutide, 1 MG/DOSE, (OZEMPIC, 1 MG/DOSE,) 2 MG/1.5ML SOPN; Inject 1 mg into the skin once a week.    Virtual Visit via telephone Note  I discussed the limitations, risks, security and privacy concerns of performing an evaluation and management service by telephone and the availability of in person appointments. The patient was identified with two identifiers. Pt.expressed understanding and agreed to proceed. Pt. Is at home. Dr. Livia Snellen  is in his office.  Follow Up Instructions:   I discussed the assessment and treatment plan with the patient. The patient was provided an opportunity to ask questions and all were answered. The patient agreed with the plan and demonstrated an understanding of the instructions.   The patient was advised to call back or seek an in-person evaluation if the symptoms worsen or if the condition fails to improve as anticipated.   Total minutes including chart review and phone contact time: 30 We reviewed carb controlled diet. Recommendation: Eliminate, bread, corn, rice , potatoes and sweets. Eat more green vegetables, chicken and fish.  I contacted CCM, Ms. Cori Razor. Hopefully she can help them get the higher dose of Ozempic and help them dose it correctly. Follow up plan: Return in about 6 weeks (around 05/07/2019).  [name], MD [place]

## 2019-03-27 ENCOUNTER — Ambulatory Visit: Payer: Medicare HMO | Admitting: *Deleted

## 2019-03-27 DIAGNOSIS — Z794 Long term (current) use of insulin: Secondary | ICD-10-CM

## 2019-03-27 NOTE — Patient Instructions (Signed)
Visit Information  Goals Addressed            This Visit's Progress     Patient Stated   . Diabetes Management (pt-stated)       Current Barriers:  . Chronic Disease Management support and education needs related to diabetes  Nurse Case Manager Clinical Goal(s):  Marland Kitchen Over the next 7 days, patient will meet with RN Care Manager to address diabetes management  Interventions:  . Evaluation of current treatment plan related to diabetes and patient's adherence to plan as established by provider. . Reviewed medications with patient and discussed increasing Ozempic from .5mg  to 1mg  per Dr Livia Snellen . Collaborated with Dr Livia Snellen regarding increase in Ozempic dose to better manage blood sugars . Discussed diet . I will check to see if Ozempic 1mg  samples are available . Advised patient to use ozempic .5mg  and to inject twice for a total of 1mg  until new samples can be provided  Patient Self Care Activities:  . Performs ADL's independently . Has help from his wife to manage his medical conditions and medications  Initial goal documentation        The patient verbalized understanding of instructions provided today and declined a print copy of patient instruction materials.   The care management team will reach out to the patient again over the next 2 days.    [name], BSN, RN-BC Embedded Chronic Care Manager [facility] / [place] [facility] Direct Dial: [phone]

## 2019-03-27 NOTE — Chronic Care Management (AMB) (Signed)
Chronic Care Management   Follow Up Note   03/27/2019 Name: Justin Ruiz MRN: [id number] DOB: 12/10/1950  Referred by: Ruiz, Warren, MD Reason for referral : No chief complaint on file.   Justin Ruiz is a 68 y.o. year old male who is a primary care patient of Ruiz, Warren, MD. The CCM team was consulted for assistance with chronic disease management and care coordination needs.    Review of patient status, including review of consultants reports, relevant laboratory and other test results, and collaboration with appropriate care team members and the patient's provider was performed as part of comprehensive patient evaluation and provision of chronic care management services.    I spoke with Justin Ruiz by telephone today regarding Justin Ruiz's diabetes management and the medication changes that Justin Ruiz has made.   Outpatient Encounter Medications as of 03/27/2019  Medication Sig  . Accu-Chek FastClix Lancets MISC Test BS QID Dx E11.9  . albuterol (PROVENTIL) (2.5 MG/3ML) 0.083% nebulizer solution Take 3 mLs (2.5 mg total) by nebulization every 6 (six) hours as needed for wheezing or shortness of breath.  . albuterol (VENTOLIN HFA) 108 (90 Base) MCG/ACT inhaler Inhale 2 puffs into the lungs every 6 (six) hours as needed for wheezing or shortness of breath.  . Alcohol Swabs (B-D SINGLE USE SWABS REGULAR) PADS Test BS QID Dx E11.9  . ALPRAZolam (XANAX) 0.25 MG tablet Take 1 tablet (0.25 mg total) by mouth 3 (three) times daily as needed. for anxiety  . Blood Glucose Monitoring Suppl (ACCU-CHEK AVIVA PLUS) w/Device KIT Test BS QID DX E11.9  . Cholecalciferol (VITAMIN D3) 5000 UNITS TABS Take 1 tablet by mouth every morning.  . ELIQUIS 5 MG TABS tablet Take 1 tablet by mouth twice daily  . Fluticasone-Umeclidin-Vilant (TRELEGY ELLIPTA) 100-62.5-25 MCG/INH AEPB Inhale 1 puff into the lungs daily. Rinse mouth well  . furosemide (LASIX) 40 MG tablet TAKE 1 TABLET BY MOUTH TWICE DAILY AS  DIRECTED  . glucose blood (ACCU-CHEK AVIVA PLUS) test strip Test BS QID Dx E11.9  . insulin NPH Human (NOVOLIN N RELION) 100 UNIT/ML injection Inject 0.55 mLs (55 Units total) into the skin 2 (two) times daily before a meal.  . insulin regular (NOVOLIN R) 100 units/mL injection Inject 15 units before breakfast and supper  . ipratropium-albuterol (DUONEB) 0.5-2.5 (3) MG/3ML SOLN Take 3 mLs by nebulization every 6 (six) hours as needed.  . Lancet Devices (LANCING DEVICE) MISC USE AS DIRECTED  . metFORMIN (GLUCOPHAGE) 500 MG tablet TAKE 1 TABLET BY MOUTH IN THE MORNING AND 2 IN THE EVENING  . metoprolol tartrate (LOPRESSOR) 100 MG tablet Take 1 tablet by mouth twice daily  . PARoxetine (PAXIL) 20 MG tablet Take 1 tablet by mouth once daily  . Semaglutide, 1 MG/DOSE, (OZEMPIC, 1 MG/DOSE,) 2 MG/1.5ML SOPN Inject 1 mg into the skin once a week.  . Semaglutide,0.25 or 0.5MG/DOS, (OZEMPIC, 0.25 OR 0.5 MG/DOSE,) 2 MG/1.5ML SOPN Inject 0.5 mg into the skin once a week.  . simvastatin (ZOCOR) 40 MG tablet Take 1 tablet by mouth once daily   No facility-administered encounter medications on file as of 03/27/2019.      RN Care Plan   . Diabetes Management (pt-stated)       Current Barriers:  . Chronic Disease Management support and education needs related to diabetes  Nurse Case Manager Clinical Goal(s):  . Over the next 7 days, patient will meet with RN Care Manager to address diabetes management  Interventions:  .   Evaluation of current treatment plan related to diabetes and patient's adherence to plan as established by provider. . Reviewed medications with patient and discussed increasing Ozempic from .80m to 153mper Justin Justin Ruiz Collaborated with Justin Justin Snellenegarding increase in Ozempic dose to better manage blood sugars . Discussed diet . I will check to see if Ozempic 35m47mamples are available . Advised patient to use ozempic .5mg53md to inject twice for a total of 35mg 14mil new samples can be  provided  Patient Self Care Activities:  . Performs ADL's independently . Has help from his wife to manage his medical conditions and medications  Initial goal documentation        Follow Up Plan The care management team will reach out to the patient again over the next 7 days.   KristChong Sicilian, RN-BC Embedded Chronic Care Manager [facility] Direct Dial: [phone]

## 2019-03-29 ENCOUNTER — Ambulatory Visit: Payer: Self-pay | Admitting: Licensed Clinical Social Worker

## 2019-03-29 DIAGNOSIS — J449 Chronic obstructive pulmonary disease, unspecified: Secondary | ICD-10-CM

## 2019-03-29 DIAGNOSIS — F411 Generalized anxiety disorder: Secondary | ICD-10-CM

## 2019-03-29 DIAGNOSIS — R0602 Shortness of breath: Secondary | ICD-10-CM

## 2019-03-29 DIAGNOSIS — E559 Vitamin D deficiency, unspecified: Secondary | ICD-10-CM

## 2019-03-29 DIAGNOSIS — K219 Gastro-esophageal reflux disease without esophagitis: Secondary | ICD-10-CM

## 2019-03-29 DIAGNOSIS — I10 Essential (primary) hypertension: Secondary | ICD-10-CM

## 2019-03-29 DIAGNOSIS — E119 Type 2 diabetes mellitus without complications: Secondary | ICD-10-CM

## 2019-03-29 DIAGNOSIS — Z794 Long term (current) use of insulin: Secondary | ICD-10-CM

## 2019-03-29 NOTE — Patient Instructions (Addendum)
Licensed Clinical Social Worker Visit Information  Goals we discussed today:  Goals    . Client will talk with LCSW in next 30 days to discuss anxiety, stress issues of client (pt-stated)     Current Barriers:  . Anxiety issues . Breathing issues . Irregular meal schedule  Clinical Social Work Clinical Goal(s):  . Client will talk with LCSW in next 30 days to discus anxiety and stress issues of client  Interventions: . Discussed CCM program services with LCSW and RNCM . Encouraged Justin Ruiz and spouse to call RNCM to discuss nursing issues of client . Discussed anxiety management for client . Discussed relaxation techniques of client (client likes to watch TV, talks with Justin Ruiz, and friend from church    Talked with client about managing panic episodes client experiences  Patient Self Care Activities:  . Attends scheduled medical appointments . Takes medications as prescribed  Plan:    Client to attend scheduled medical appointments LCSW to call client or spouse of client  in next 3 weeks to talk with client about his anxiety and stress symptoms management.  Client or spouse to talk with RNCM as needed to discuss nursing needs  Initial goal documentation         Materials Provided: No  Follow Up Plan:  LCSW to call client or spouse of client in next 3 weeks to talk with client about his anxiety and stress symptoms management  The patient verbalized understanding of instructions provided today and declined a print copy of patient instruction materials.   Justin Ruiz.Justin Ruiz MSW, LCSW Licensed Clinical Social Worker [facility] [phone]

## 2019-03-29 NOTE — Chronic Care Management (AMB) (Signed)
Care Management Note   Justin Ruiz is a 68 y.o. year old male who is a primary care patient of Stacks, Warren, MD. The CM team was consulted for assistance with chronic disease management and care coordination.   I reached out to Justin Ruiz by phone today.   Review of patient status, including review of consultants reports, relevant laboratory and other test results, and collaboration with appropriate care team members and the patient's provider was performed as part of comprehensive patient evaluation and provision of chronic care management services.   Social determinants of health:risk of social isolation; risk of tobacco use; risk of stress; risk of physical inactivity    Chronic Care Management from 03/04/2019 in [facility]  PHQ-9 Total Score  6     GAD 7 : Generalized Anxiety Score 03/04/2019  Nervous, Anxious, on Edge 1  Control/stop worrying 0  Worry too much - different things 0  Trouble relaxing 1  Restless 0  Easily annoyed or irritable 0  Afraid - awful might happen 1  Total GAD 7 Score 3  Anxiety Difficulty Somewhat difficult   Medications   New medications from outside sources are available for reconciliation   Accu-Chek FastClix Lancets MISC    albuterol (PROVENTIL) (2.5 MG/3ML) 0.083% nebulizer solution    albuterol (VENTOLIN HFA) 108 (90 Base) MCG/ACT inhaler    Alcohol Swabs (B-D SINGLE USE SWABS REGULAR) PADS    ALPRAZolam (XANAX) 0.25 MG tablet    Blood Glucose Monitoring Suppl (ACCU-CHEK AVIVA PLUS) w/Device KIT    Cholecalciferol (VITAMIN D3) 5000 UNITS TABS    ELIQUIS 5 MG TABS tablet    Fluticasone-Umeclidin-Vilant (TRELEGY ELLIPTA) 100-62.5-25 MCG/INH AEPB    furosemide (LASIX) 40 MG tablet    glucose blood (ACCU-CHEK AVIVA PLUS) test strip    insulin NPH Human (NOVOLIN N RELION) 100 UNIT/ML injection    insulin regular (NOVOLIN R) 100 units/mL injection    ipratropium-albuterol (DUONEB) 0.5-2.5 (3) MG/3ML SOLN(Expired)     Lancet Devices (LANCING DEVICE) MISC    metFORMIN (GLUCOPHAGE) 500 MG tablet    metoprolol tartrate (LOPRESSOR) 100 MG tablet    PARoxetine (PAXIL) 20 MG tablet    Semaglutide, 1 MG/DOSE, (OZEMPIC, 1 MG/DOSE,) 2 MG/1.5ML SOPN    Semaglutide,0.25 or 0.5MG/DOS, (OZEMPIC, 0.25 OR 0.5 MG/DOSE,) 2 MG/1.5ML SOPN    simvastatin (ZOCOR) 40 MG tablet     Goals    . Client will talk with LCSW in next 30 days to discuss anxiety, stress issues of client (pt-stated)     Current Barriers:  . Anxiety issues . Breathing issues . Irregular meal schedule  Clinical Social Work Clinical Goal(s):  . Client will talk with LCSW in next 30 days to discus anxiety and stress issues of client  Interventions: . Discussed CCM program services with LCSW and RNCM . Encouraged [name] and spouse to call RNCM to discuss nursing issues of client . Discussed anxiety management for client  .  Discussed relaxation techniques of client (client likes to watch TV, talks with Pastor, and friend from church   . Talked with client about managing panic episodes client experiences   Patient Self Care Activities:  . Attends scheduled medical appointments . Takes medications as prescribed  Plan:    Client to attend scheduled medical appointments LCSW to call client or spouse of client  in next 3 weeks to talk with client about his anxiety and stress symptoms management.  Client or spouse to talk with RNCM as   needed to discuss nursing needs  Initial goal documentation           Follow Up Plan:  LCSW to call client or spouse in next 3 weeks to talk with client/spouse about client's anxiety and stress symptoms  management   S. MSW, LCSW Licensed Clinical Social Worker [facility] [phone] 

## 2019-04-08 DIAGNOSIS — J9611 Chronic respiratory failure with hypoxia: Secondary | ICD-10-CM | POA: Diagnosis not present

## 2019-04-08 DIAGNOSIS — J449 Chronic obstructive pulmonary disease, unspecified: Secondary | ICD-10-CM | POA: Diagnosis not present

## 2019-04-09 ENCOUNTER — Ambulatory Visit (INDEPENDENT_AMBULATORY_CARE_PROVIDER_SITE_OTHER): Payer: Medicare HMO | Admitting: *Deleted

## 2019-04-09 DIAGNOSIS — Z794 Long term (current) use of insulin: Secondary | ICD-10-CM

## 2019-04-09 DIAGNOSIS — L84 Corns and callosities: Secondary | ICD-10-CM | POA: Diagnosis not present

## 2019-04-09 DIAGNOSIS — E119 Type 2 diabetes mellitus without complications: Secondary | ICD-10-CM | POA: Diagnosis not present

## 2019-04-09 DIAGNOSIS — I4891 Unspecified atrial fibrillation: Secondary | ICD-10-CM | POA: Diagnosis not present

## 2019-04-09 DIAGNOSIS — B351 Tinea unguium: Secondary | ICD-10-CM | POA: Diagnosis not present

## 2019-04-09 DIAGNOSIS — E1142 Type 2 diabetes mellitus with diabetic polyneuropathy: Secondary | ICD-10-CM | POA: Diagnosis not present

## 2019-04-09 DIAGNOSIS — Z7901 Long term (current) use of anticoagulants: Secondary | ICD-10-CM

## 2019-04-09 DIAGNOSIS — M79676 Pain in unspecified toe(s): Secondary | ICD-10-CM | POA: Diagnosis not present

## 2019-04-09 NOTE — Chronic Care Management (AMB) (Signed)
Chronic Care Management   Care Coordination Note   04/09/2019 Name: Justin Ruiz MRN: [id number] DOB: 11-Jul-1950  Referred by: [name], MD Reason for referral : Chronic Care Management (Care Coordination)   Justin Ruiz is a 68 y.o. year old male who is a primary care patient of Stacks, [name], MD. The CCM team was consulted for assistance with chronic disease management and care coordination needs.    Review of patient status, including review of consultants reports, relevant laboratory and other test results, and collaboration with appropriate care team members and the patient's provider was performed as part of comprehensive patient evaluation and provision of chronic care management services.    Care Coordination regarding samples of Ozempic and patient assistance forms for Eliquis.   Outpatient Encounter Medications as of 04/09/2019  Medication Sig  . Accu-Chek FastClix Lancets MISC Test BS QID Dx E11.9  . albuterol (PROVENTIL) (2.5 MG/3ML) 0.083% nebulizer solution Take 3 mLs (2.5 mg total) by nebulization every 6 (six) hours as needed for wheezing or shortness of breath.  Marland Kitchen albuterol (VENTOLIN HFA) 108 (90 Base) MCG/ACT inhaler Inhale 2 puffs into the lungs every 6 (six) hours as needed for wheezing or shortness of breath.  . Alcohol Swabs (B-D SINGLE USE SWABS REGULAR) PADS Test BS QID Dx E11.9  . ALPRAZolam (XANAX) 0.25 MG tablet Take 1 tablet (0.25 mg total) by mouth 3 (three) times daily as needed. for anxiety  . Blood Glucose Monitoring Suppl (ACCU-CHEK AVIVA PLUS) w/Device KIT Test BS QID DX E11.9  . Cholecalciferol (VITAMIN D3) 5000 UNITS TABS Take 1 tablet by mouth every morning.  Marland Kitchen ELIQUIS 5 MG TABS tablet Take 1 tablet by mouth twice daily  . Fluticasone-Umeclidin-Vilant (TRELEGY ELLIPTA) 100-62.5-25 MCG/INH AEPB Inhale 1 puff into the lungs daily. Rinse mouth well  . furosemide (LASIX) 40 MG tablet TAKE 1 TABLET BY MOUTH TWICE DAILY AS DIRECTED  . glucose  blood (ACCU-CHEK AVIVA PLUS) test strip Test BS QID Dx E11.9  . insulin NPH Human (NOVOLIN N RELION) 100 UNIT/ML injection Inject 0.55 mLs (55 Units total) into the skin 2 (two) times daily before a meal.  . insulin regular (NOVOLIN R) 100 units/mL injection Inject 15 units before breakfast and supper  . ipratropium-albuterol (DUONEB) 0.5-2.5 (3) MG/3ML SOLN Take 3 mLs by nebulization every 6 (six) hours as needed.  Elmore Guise Devices (LANCING DEVICE) MISC USE AS DIRECTED  . metFORMIN (GLUCOPHAGE) 500 MG tablet TAKE 1 TABLET BY MOUTH IN THE MORNING AND 2 IN THE EVENING  . metoprolol tartrate (LOPRESSOR) 100 MG tablet Take 1 tablet by mouth twice daily  . PARoxetine (PAXIL) 20 MG tablet Take 1 tablet by mouth once daily  . Semaglutide, 1 MG/DOSE, (OZEMPIC, 1 MG/DOSE,) 2 MG/1.5ML SOPN Inject 1 mg into the skin once a week.  . Semaglutide,0.25 or 0.5MG/DOS, (OZEMPIC, 0.25 OR 0.5 MG/DOSE,) 2 MG/1.5ML SOPN Inject 0.5 mg into the skin once a week.  . simvastatin (ZOCOR) 40 MG tablet Take 1 tablet by mouth once daily   No facility-administered encounter medications on file as of 04/09/2019.     RN Care Plan   . Diabetes Management (pt-stated)       Current Barriers:  . Chronic Disease Management support and education needs related to diabetes  Nurse Case Manager Clinical Goal(s):  Marland Kitchen Over the next 7 days, patient will meet with RN Care Manager to address diabetes management  Interventions:  . Evaluation of current treatment plan related to diabetes and patient's  adherence to plan as established by provider. . Reviewed medications with patient and discussed increasing Ozempic from .657m to 123mper Dr StLivia Snellen Collaborated with PCP clinical staff to provide Ozempic 57m35mamples . Will f/u with patient/wife regarding proper usage of 57mg65mns once received  Patient Self Care Activities:  . Performs ADL's independently . Has help from his wife to manage his medical conditions and medications  Please  see past updates related to this goal by clicking on the "Past Updates" button in the selected goal      . Medication Cost Management: Eliquis (pt-stated)       Current Barriers:  . KnMarland Kitchenwledge Deficits related to patient prescription assistance . FinaFilm/video editor Chronic Disease Management support and education needs related to Afib  Nurse Case Manager Clinical Goal(s):  . OvMarland Kitchenr the next 30 days, patient will work with RN CConsulting civil engineercomplete patient assistance application for BMS   Interventions:  . Reviewed completed forms provided by patient . Collaborated with PCP clinical staff to complete provider's portion or forms . Faxed forms and required documentation to BMS . Patient/wife aware that they will be contacted by BMS regarding application acceptance  Patient Self Care Activities:  . Performs ADL's independently . Performs IADL's independently . Wife assists with medications and health maintenance  Please see past updates related to this goal by clicking on the "Past Updates" button in the selected goal         Follow up Plan The care management team will reach out to the patient again over the next 14 days.    KrisChong SicilianN, RN-BC Embedded Chronic Care Manager [facility] Direct Dial: [phone]

## 2019-04-09 NOTE — Patient Instructions (Signed)
Visit Information  Goals Addressed            This Visit's Progress     Patient Stated   . Diabetes Management (pt-stated)       Current Barriers:  . Chronic Disease Management support and education needs related to diabetes  Nurse Case Manager Clinical Goal(s):  Marland Kitchen Over the next 7 days, patient will meet with RN Care Manager to address diabetes management  Interventions:  . Evaluation of current treatment plan related to diabetes and patient's adherence to plan as established by provider. . Reviewed medications with patient and discussed increasing Ozempic from .5mg  to 1mg  per Dr Livia Snellen . Collaborated with PCP clinical staff to provide Ozempic 1mg  samples . Will f/u with patient/wife regarding proper usage of 1mg  pens once received  Patient Self Care Activities:  . Performs ADL's independently . Has help from his wife to manage his medical conditions and medications  Please see past updates related to this goal by clicking on the "Past Updates" button in the selected goal      . 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  . Chronic Disease Management support and education needs related to Afib  Nurse Case Manager Clinical Goal(s):  Marland Kitchen Over the next 30 days, patient will work with Consulting civil engineer to complete patient assistance application for BMS   Interventions:  . Reviewed completed forms provided by patient . Collaborated with PCP clinical staff to complete provider's portion or forms . Faxed forms and required documentation to BMS . Patient/wife aware that they will be contacted by BMS regarding application acceptance  Patient Self Care Activities:  . Performs ADL's independently . Performs IADL's independently . Wife assists with medications and health maintenance  Please see past updates related to this goal by clicking on the "Past Updates"  button in the selected goal        The care management team will reach out to the patient again over the next 14 days.    [name], BSN, RN-BC Embedded Chronic Care Manager [facility] / [place] [facility] Direct Dial: [phone]

## 2019-04-10 ENCOUNTER — Ambulatory Visit: Payer: Medicare HMO | Admitting: *Deleted

## 2019-04-10 DIAGNOSIS — E119 Type 2 diabetes mellitus without complications: Secondary | ICD-10-CM

## 2019-04-10 DIAGNOSIS — Z794 Long term (current) use of insulin: Secondary | ICD-10-CM

## 2019-04-11 DIAGNOSIS — J449 Chronic obstructive pulmonary disease, unspecified: Secondary | ICD-10-CM | POA: Diagnosis not present

## 2019-04-12 NOTE — Chronic Care Management (AMB) (Signed)
Chronic Care Management   Follow Up Note  04/10/2019 Name: RONAV FURNEY MRN: [id number] DOB: Sep 09, 1950  Referred by: [name], MD Reason for referral : Chronic Care Management (RN follow up)   PERKINS MOLINA is a 68 y.o. year old male who is a primary care patient of Stacks, [name], MD. The CCM team was consulted for assistance with chronic disease management and care coordination needs.    Review of patient status, including review of consultants reports, relevant laboratory and other test results, and collaboration with appropriate care team members and the patient's provider was performed as part of comprehensive patient evaluation and provision of chronic care management services.    Spoke with patient's wife, [name], by telephone today regarding his Ozempic and diabetes management.   Outpatient Encounter Medications as of 04/10/2019  Medication Sig  . Accu-Chek FastClix Lancets MISC Test BS QID Dx E11.9  . albuterol (PROVENTIL) (2.5 MG/3ML) 0.083% nebulizer solution Take 3 mLs (2.5 mg total) by nebulization every 6 (six) hours as needed for wheezing or shortness of breath.  Marland Kitchen albuterol (VENTOLIN HFA) 108 (90 Base) MCG/ACT inhaler Inhale 2 puffs into the lungs every 6 (six) hours as needed for wheezing or shortness of breath.  . Alcohol Swabs (B-D SINGLE USE SWABS REGULAR) PADS Test BS QID Dx E11.9  . ALPRAZolam (XANAX) 0.25 MG tablet Take 1 tablet (0.25 mg total) by mouth 3 (three) times daily as needed. for anxiety  . Blood Glucose Monitoring Suppl (ACCU-CHEK AVIVA PLUS) w/Device KIT Test BS QID DX E11.9  . Cholecalciferol (VITAMIN D3) 5000 UNITS TABS Take 1 tablet by mouth every morning.  Marland Kitchen ELIQUIS 5 MG TABS tablet Take 1 tablet by mouth twice daily  . Fluticasone-Umeclidin-Vilant (TRELEGY ELLIPTA) 100-62.5-25 MCG/INH AEPB Inhale 1 puff into the lungs daily. Rinse mouth well  . furosemide (LASIX) 40 MG tablet TAKE 1 TABLET BY MOUTH TWICE DAILY AS DIRECTED  . glucose  blood (ACCU-CHEK AVIVA PLUS) test strip Test BS QID Dx E11.9  . insulin NPH Human (NOVOLIN N RELION) 100 UNIT/ML injection Inject 0.55 mLs (55 Units total) into the skin 2 (two) times daily before a meal.  . insulin regular (NOVOLIN R) 100 units/mL injection Inject 15 units before breakfast and supper  . Lancet Devices (LANCING DEVICE) MISC USE AS DIRECTED  . metFORMIN (GLUCOPHAGE) 500 MG tablet TAKE 1 TABLET BY MOUTH IN THE MORNING AND 2 IN THE EVENING  . metoprolol tartrate (LOPRESSOR) 100 MG tablet Take 1 tablet by mouth twice daily  . PARoxetine (PAXIL) 20 MG tablet Take 1 tablet by mouth once daily  . Semaglutide, 1 MG/DOSE, (OZEMPIC, 1 MG/DOSE,) 2 MG/1.5ML SOPN Inject 1 mg into the skin once a week.  . Semaglutide,0.25 or 0.5MG/DOS, (OZEMPIC, 0.25 OR 0.5 MG/DOSE,) 2 MG/1.5ML SOPN Inject 0.5 mg into the skin once a week.  . simvastatin (ZOCOR) 40 MG tablet Take 1 tablet by mouth once daily   No facility-administered encounter medications on file as of 04/10/2019.     RN Care Plan    Current Barriers:  . Chronic Disease Management support and education needs related to diabetes  Nurse Case Manager Clinical Goal(s):  Marland Kitchen Over the next 7 days, patient will meet with RN Care Manager to address diabetes management  Interventions:  . Talked with wife by telephone regarding Ozempic increase in dose and samples . Arranged for wife to come in for Ozempic Pen demonstration on 04/15/2019 at 10:00  Patient Self Care Activities:  . Performs ADL's  independently . Has help from his wife to manage his medical conditions and medications  Please see past updates related to this goal by clicking on the "Past Updates" button in the selected goal          Face to Face appointment with care management team member scheduled for: 04/15/2019  [name], BSN, RN-BC [place] [facility] [place] [facility] Direct Dial: [phone]

## 2019-04-12 NOTE — Patient Instructions (Signed)
Visit Information  Goals Addressed            This Visit's Progress     Patient Stated   . Diabetes Management (pt-stated)       Current Barriers:  . Chronic Disease Management support and education needs related to diabetes  Nurse Case Manager Clinical Goal(s):  Marland Kitchen Over the next 7 days, patient will meet with RN Care Manager to address diabetes management  Interventions:  . Talked with wife by telephone regarding Ozempic increase in dose and samples . Arranged for wife to come in for Ozempic Pen demonstration on 04/15/2019 at 10:00  Patient Self Care Activities:  . Performs ADL's independently . Has help from his wife to manage his medical conditions and medications  Please see past updates related to this goal by clicking on the "Past Updates" button in the selected goal        Face to Face appointment with care management team member scheduled for: 04/15/2019   [name], BSN, RN-BC [place] / [place]: [phone]   The patient verbalized understanding of instructions provided today and declined a print copy of patient instruction materials.

## 2019-04-15 ENCOUNTER — Ambulatory Visit: Payer: Medicare HMO | Admitting: *Deleted

## 2019-04-15 DIAGNOSIS — E119 Type 2 diabetes mellitus without complications: Secondary | ICD-10-CM | POA: Diagnosis not present

## 2019-04-15 DIAGNOSIS — Z794 Long term (current) use of insulin: Secondary | ICD-10-CM | POA: Diagnosis not present

## 2019-04-15 DIAGNOSIS — I4891 Unspecified atrial fibrillation: Secondary | ICD-10-CM | POA: Diagnosis not present

## 2019-04-15 NOTE — Patient Instructions (Signed)
Visit Information  Goals Addressed            This Visit's Progress     Patient Stated   . Diabetes Management (pt-stated)       Current Barriers:  . Chronic Disease Management support and education needs related to diabetes  Nurse Case Manager Clinical Goal(s):  Marland Kitchen Over the next 7 days, patient will meet with RN Care Manager to address diabetes management  Interventions:  . Talked with patient's wife by telephone.  . Advised that samples of of 1mg  aren't available . Collaborated with drug rep and found out that they don't supply 1mg  samples . Explained to wife that he will need to apply for prescription assistance for next year . Referral to [facility] team placed to see if they can simplifier his diabetes medication regimen and decrease cost . Advised to continue using two injections of Ozempic 0.5mg  for now . Consider Freestyle Libre for monitoring  Patient Self Care Activities:  . Performs ADL's independently . Has help from his wife to manage his medical conditions and medications  Please see past updates related to this goal by clicking on the "Past Updates" button in the selected goal         The patient verbalized understanding of instructions provided today and declined a print copy of patient instruction materials.    Follow-up Plan RN will reach out by telephone over the next 2 weeks Pharmacy team will f/u with patient as requested Patient/wife will follow-up with CCM team as needed   [name], BSN, RN-BC [place] / [place] [facility] Direct Dial: [phone]

## 2019-04-15 NOTE — Chronic Care Management (AMB) (Signed)
Chronic Care Management   Follow Up Note   04/15/2019 Name: Justin Ruiz MRN: [id number] DOB: 15-Oct-1950  Referred by: Justin Fraise, MD Reason for referral : No chief complaint on file.   Justin Ruiz is a 68 y.o. year old male who is a primary care patient of Stacks, [name], MD. The CCM team was consulted for assistance with chronic disease management and care coordination needs.    Review of patient status, including review of consultants reports, relevant laboratory and other test results, and collaboration with appropriate care team members and the patient's provider was performed as part of comprehensive patient evaluation and provision of chronic care management services.    I spoke with Justin Ruiz's wife, Justin Ruiz, on his behalf. He is using .48m of Ozempic, two injections once weekly for a total of 113m He is also using OTC Relion insulin which can be costly.   Outpatient Encounter Medications as of 04/15/2019  Medication Sig  . Accu-Chek FastClix Lancets MISC Test BS QID Dx E11.9  . albuterol (PROVENTIL) (2.5 MG/3ML) 0.083% nebulizer solution Take 3 mLs (2.5 mg total) by nebulization every 6 (six) hours as needed for wheezing or shortness of breath.  . Marland Kitchenlbuterol (VENTOLIN HFA) 108 (90 Base) MCG/ACT inhaler Inhale 2 puffs into the lungs every 6 (six) hours as needed for wheezing or shortness of breath.  . Alcohol Swabs (B-D SINGLE USE SWABS REGULAR) PADS Test BS QID Dx E11.9  . ALPRAZolam (XANAX) 0.25 MG tablet Take 1 tablet (0.25 mg total) by mouth 3 (three) times daily as needed. for anxiety  . Blood Glucose Monitoring Suppl (ACCU-CHEK AVIVA PLUS) w/Device KIT Test BS QID DX E11.9  . Cholecalciferol (VITAMIN D3) 5000 UNITS TABS Take 1 tablet by mouth every morning.  . Marland KitchenLIQUIS 5 MG TABS tablet Take 1 tablet by mouth twice daily  . Fluticasone-Umeclidin-Vilant (TRELEGY ELLIPTA) 100-62.5-25 MCG/INH AEPB Inhale 1 puff into the lungs daily. Rinse mouth well  . furosemide (LASIX)  40 MG tablet TAKE 1 TABLET BY MOUTH TWICE DAILY AS DIRECTED  . glucose blood (ACCU-CHEK AVIVA PLUS) test strip Test BS QID Dx E11.9  . insulin NPH Human (NOVOLIN N RELION) 100 UNIT/ML injection Inject 0.55 mLs (55 Units total) into the skin 2 (two) times daily before a meal.  . insulin regular (NOVOLIN R) 100 units/mL injection Inject 15 units before breakfast and supper  . ipratropium-albuterol (DUONEB) 0.5-2.5 (3) MG/3ML SOLN Take 3 mLs by nebulization every 6 (six) hours as needed.  . Elmore Guiseevices (LANCING DEVICE) MISC USE AS DIRECTED  . metFORMIN (GLUCOPHAGE) 500 MG tablet TAKE 1 TABLET BY MOUTH IN THE MORNING AND 2 IN THE EVENING  . metoprolol tartrate (LOPRESSOR) 100 MG tablet Take 1 tablet by mouth twice daily  . PARoxetine (PAXIL) 20 MG tablet Take 1 tablet by mouth once daily  . Semaglutide, 1 MG/DOSE, (OZEMPIC, 1 MG/DOSE,) 2 MG/1.5ML SOPN Inject .20m33mnto the skin twice (for a total of 1 mg) once a week.  . simvastatin (ZOCOR) 40 MG tablet Take 1 tablet by mouth once daily   No facility-administered encounter medications on file as of 04/15/2019.     RN Care Plan   . Diabetes Management (pt-stated)       Current Barriers:  . Chronic Disease Management support and education needs related to diabetes  Nurse Case Manager Clinical Goal(s):  . OMarland Kitchener the next 7 days, patient will meet with RN Care Manager to address diabetes management  Interventions:  . Talked with  patient's wife by telephone.  . Advised that samples of of 66m aren't available . Collaborated with drug rep and found out that they don't supply 1616msamples . Explained to wife that he will need to apply for prescription assistance for next year . [place] [facility] [place] [place] to see if they can simplifier his diabetes medication regimen and decrease cost . Advised to continue using two injections of Ozempic 0.16m16mor now . Consider Freestyle Libre for monitoring  Patient Self Care Activities:  . Performs  ADL's independently . Has help from his wife to manage his medical conditions and medications  Please see past updates related to this goal by clicking on the "Past Updates" button in the selected goal         Follow-up Plan RN will reach out by telephone over the next 2 weeks Pharmacy team will f/u with patient as requested Patient/wife will follow-up with CCM team as needed   KriChong SicilianSN, RN-BC [phone]

## 2019-04-17 ENCOUNTER — Ambulatory Visit: Payer: Self-pay | Admitting: Licensed Clinical Social Worker

## 2019-04-17 DIAGNOSIS — K219 Gastro-esophageal reflux disease without esophagitis: Secondary | ICD-10-CM

## 2019-04-17 DIAGNOSIS — F411 Generalized anxiety disorder: Secondary | ICD-10-CM

## 2019-04-17 DIAGNOSIS — E119 Type 2 diabetes mellitus without complications: Secondary | ICD-10-CM

## 2019-04-17 DIAGNOSIS — J449 Chronic obstructive pulmonary disease, unspecified: Secondary | ICD-10-CM

## 2019-04-17 DIAGNOSIS — I10 Essential (primary) hypertension: Secondary | ICD-10-CM

## 2019-04-17 DIAGNOSIS — Z794 Long term (current) use of insulin: Secondary | ICD-10-CM

## 2019-04-17 DIAGNOSIS — E782 Mixed hyperlipidemia: Secondary | ICD-10-CM

## 2019-04-17 NOTE — Chronic Care Management (AMB) (Signed)
Care Management Note   Justin Ruiz is a 68 y.o. year old male who is a primary care patient of Stacks, [name], MD. The CM team was consulted for assistance with chronic disease management and care coordination.   I reached out to [employer], spouse, by phone today.   Review of patient status, including review of consultants reports, relevant laboratory and other test results, and collaboration with appropriate care team members and the patient's provider was performed as part of comprehensive patient evaluation and provision of chronic care management services.   Social determinants of health: risk of social isolation; risk of tobacco use; risk of depression ; risk of stress; risk of physical inactivity    Chronic Care Management from 03/04/2019 in [place]  PHQ-9 Total Score  6     GAD 7 : Generalized Anxiety Score 03/04/2019  Nervous, Anxious, on Edge 1  Control/stop worrying 0  Worry too much - different things 0  Trouble relaxing 1  Restless 0  Easily annoyed or irritable 0  Afraid - awful might happen 1  Total GAD 7 Score 3  Anxiety Difficulty Somewhat difficult   Medications   (very important)  New medications from outside sources are available for reconciliation  Accu-Chek FastClix Lancets MISC albuterol (PROVENTIL) (2.5 MG/3ML) 0.083% nebulizer solution albuterol (VENTOLIN HFA) 108 (90 Base) MCG/ACT inhaler Alcohol Swabs (B-D SINGLE USE SWABS REGULAR) PADS ALPRAZolam (XANAX) 0.25 MG tablet Blood Glucose Monitoring Suppl (ACCU-CHEK AVIVA PLUS) w/Device KIT Cholecalciferol (VITAMIN D3) 5000 UNITS TABS ELIQUIS 5 MG TABS tablet Fluticasone-Umeclidin-Vilant (TRELEGY ELLIPTA) 100-62.5-25 MCG/INH AEPB furosemide (LASIX) 40 MG tablet glucose blood (ACCU-CHEK AVIVA PLUS) test strip insulin NPH Human (NOVOLIN N RELION) 100 UNIT/ML injection insulin regular (NOVOLIN R) 100 units/mL injection ipratropium-albuterol (DUONEB) 0.5-2.5  (3) MG/3ML SOLN(Expired) Lancet Devices (LANCING DEVICE) MISC metFORMIN (GLUCOPHAGE) 500 MG tablet metoprolol tartrate (LOPRESSOR) 100 MG tablet PARoxetine (PAXIL) 20 MG tablet Semaglutide, 1 MG/DOSE, (OZEMPIC, 1 MG/DOSE,) 2 MG/1.5ML SOPN Semaglutide,0.25 or 0.5MG/DOS, (OZEMPIC, 0.25 OR 0.5 MG/DOSE,) 2 MG/1.5ML SOPN simvastatin (ZOCOR) 40 MG tablet   Goals Addressed            This Visit's Progress   . Client will talk with LCSW in next 30 days to discuss anxiety, stress issues of client (pt-stated)       Current Barriers:  . Anxiety issues in client with Chronic Diagnoses of GAD, COPD,DM Type 2, HLD, HTN, and GERD . Breathing issues . Irregular meal schedule  Clinical Social Work Clinical Goal(s):  . Client will talk with LCSW in next 30 days to discus anxiety and stress issues of client  Interventions: . Discussed CCM program services with LCSW and RNCM . Encouraged [name] and spouse to call RNCM to discuss nursing issues of client . Discussed anxiety management for client . Discussed relaxation techniques of client (client likes to watch TV, talks with [name], and friend from church    Patient Self Care Activities:  . Attends scheduled medical appointments . Takes medications as prescribed  Plan:    Client to attend scheduled medical appointments LCSW to call client or spouse of client  in next 4 weeks to talk with client about his anxiety and stress symptoms management.  Client or spouse to talk with RNCM as needed to discuss nursing needs  Initial goal documentation       Follow Up Plan: LCSW to call client in next 4 weeks to talk with client about his anxiety and stress  symptoms management   S. MSW, LCSW Licensed Clinical Social Worker [facility] [phone] 

## 2019-04-17 NOTE — Patient Instructions (Signed)
Licensed Clinical Social Worker Visit Information  Goals we discussed today:  Goals Addressed            This Visit's Progress   . Client will talk with LCSW in next 30 days to discuss anxiety, stress issues of client (pt-stated)       Current Barriers:  . Anxiety issues in client with Chronic Diagnoses of GAD, COPD,DM Type 2, HLD, HTN, and GERD . Breathing issues . Irregular meal schedule  Clinical Social Work Clinical Goal(s):  . Client will talk with LCSW in next 30 days to discus anxiety and stress issues of client  Interventions: . Discussed CCM program services with LCSW and RNCM . Encouraged [name] and spouse to call RNCM to discuss nursing issues of client . Discussed anxiety management for client . Discussed relaxation techniques of client (client likes to watch TV, talks with [name], and friend from church    Patient Self Care Activities:  . Attends scheduled medical appointments . Takes medications as prescribed  Plan:    Client to attend scheduled medical appointments LCSW to call client or spouse of client  in next 4 weeks to talk with client about his anxiety and stress symptoms management.  Client or spouse to talk with RNCM as needed to discuss nursing needs  Initial goal documentation         Materials Provided: No  Follow Up Plan: LCSW to call client/spouse in next 4 weeks to talk with client or spouse about his anxiety and stress symptoms management  The patient Justin Ruiz, verbalized understanding of instructions provided today and declined a print copy of patient instruction materials.   Norva Riffle.Rolinda Impson MSW, LCSW Licensed Clinical Social Worker [facility] [phone]

## 2019-04-18 ENCOUNTER — Ambulatory Visit: Payer: Medicare HMO | Admitting: *Deleted

## 2019-04-18 DIAGNOSIS — E119 Type 2 diabetes mellitus without complications: Secondary | ICD-10-CM | POA: Diagnosis not present

## 2019-04-18 DIAGNOSIS — Z794 Long term (current) use of insulin: Secondary | ICD-10-CM

## 2019-04-18 DIAGNOSIS — I4891 Unspecified atrial fibrillation: Secondary | ICD-10-CM | POA: Diagnosis not present

## 2019-04-18 NOTE — Chronic Care Management (AMB) (Signed)
Chronic Care Management   Follow Up Note   04/18/2019 Name: Justin Ruiz MRN: [id number] DOB: April 05, 1951  Referred by: [name], MD Reason for referral : Chronic Care Management (RN follow up)   Justin Ruiz is a 68 y.o. year old male who is a primary care patient of Stacks, [name], MD. The CCM team was consulted for assistance with chronic disease management and care coordination needs.    Review of patient status, including review of consultants reports, relevant laboratory and other test results, and collaboration with appropriate care team members and the patient's provider was performed as part of comprehensive patient evaluation and provision of chronic care management services.    I spoke with Ms Rabalais by telephone this morning regarding Justin Ruiz's diabetes management.   Outpatient Encounter Medications as of 04/18/2019  Medication Sig  . Accu-Chek FastClix Lancets MISC Test BS QID Dx E11.9  . albuterol (PROVENTIL) (2.5 MG/3ML) 0.083% nebulizer solution Take 3 mLs (2.5 mg total) by nebulization every 6 (six) hours as needed for wheezing or shortness of breath.  Marland Kitchen albuterol (VENTOLIN HFA) 108 (90 Base) MCG/ACT inhaler Inhale 2 puffs into the lungs every 6 (six) hours as needed for wheezing or shortness of breath.  . Alcohol Swabs (B-D SINGLE USE SWABS REGULAR) PADS Test BS QID Dx E11.9  . ALPRAZolam (XANAX) 0.25 MG tablet Take 1 tablet (0.25 mg total) by mouth 3 (three) times daily as needed. for anxiety  . Blood Glucose Monitoring Suppl (ACCU-CHEK AVIVA PLUS) w/Device KIT Test BS QID DX E11.9  . Cholecalciferol (VITAMIN D3) 5000 UNITS TABS Take 1 tablet by mouth every morning.  Marland Kitchen ELIQUIS 5 MG TABS tablet Take 1 tablet by mouth twice daily  . Fluticasone-Umeclidin-Vilant (TRELEGY ELLIPTA) 100-62.5-25 MCG/INH AEPB Inhale 1 puff into the lungs daily. Rinse mouth well  . furosemide (LASIX) 40 MG tablet TAKE 1 TABLET BY MOUTH TWICE DAILY AS DIRECTED  . glucose blood  (ACCU-CHEK AVIVA PLUS) test strip Test BS QID Dx E11.9  . insulin NPH Human (NOVOLIN N RELION) 100 UNIT/ML injection Inject 0.55 mLs (55 Units total) into the skin 2 (two) times daily before a meal.  . insulin regular (NOVOLIN R) 100 units/mL injection Inject 15 units before breakfast and supper  . ipratropium-albuterol (DUONEB) 0.5-2.5 (3) MG/3ML SOLN Take 3 mLs by nebulization every 6 (six) hours as needed.  Elmore Guise Devices (LANCING DEVICE) MISC USE AS DIRECTED  . metFORMIN (GLUCOPHAGE) 500 MG tablet TAKE 1 TABLET BY MOUTH IN THE MORNING AND 2 IN THE EVENING  . metoprolol tartrate (LOPRESSOR) 100 MG tablet Take 1 tablet by mouth twice daily  . PARoxetine (PAXIL) 20 MG tablet Take 1 tablet by mouth once daily  . Semaglutide, 1 MG/DOSE, (OZEMPIC, 1 MG/DOSE,) 2 MG/1.5ML SOPN Inject 1 mg into the skin once a week.  . Semaglutide,0.25 or 0.5MG/DOS, (OZEMPIC, 0.25 OR 0.5 MG/DOSE,) 2 MG/1.5ML SOPN Inject 0.5 mg into the skin once a week.  . simvastatin (ZOCOR) 40 MG tablet Take 1 tablet by mouth once daily   No facility-administered encounter medications on file as of 04/18/2019.     RN Care Manager   . Diabetes Management (pt-stated)       Current Barriers:  . Chronic Disease Management support and education needs related to diabetes  Nurse Case Manager Clinical Goal(s):  Marland Kitchen Over the next 7 days, patient will meet with RN Care Manager to address diabetes management  Interventions:  . Talked with patient's wife by telephone.  Marland Kitchen  Advised to continue using two injections of Ozempic 0.67m for now . Wife's questions answered regarding Ozempic administration . Wife's questions answered regarding Ozempic Rx Assistance Application . Will recommend Freestyle Libre for monitoring  Patient Self Care Activities:  . Performs ADL's independently . Has help from his wife to manage his medical conditions and medications  Please see past updates related to this goal by clicking on the "Past Updates"  button in the selected goal         Follow-up Plan The care management team will reach out to the patient again over the next 14 days.    KChong Sicilian BSN, RN-BC Embedded Chronic Care Manager [facility] Direct Dial: [phone]

## 2019-04-18 NOTE — Patient Instructions (Signed)
Visit Information  Goals Addressed            This Visit's Progress     Patient Stated   . Diabetes Management (pt-stated)       Current Barriers:  . Chronic Disease Management support and education needs related to diabetes  Nurse Case Manager Clinical Goal(s):  Marland Kitchen Over the next 7 days, patient will meet with RN Care Manager to address diabetes management  Interventions:  . Talked with patient's wife by telephone.  . Advised to continue using two injections of Ozempic 0.5mg  for now . Wife's questions answered regarding Ozempic administration . Wife's questions answered regarding Ozempic Rx Assistance Application . Will recommend Freestyle Libre for monitoring  Patient Self Care Activities:  . Performs ADL's independently . Has help from his wife to manage his medical conditions and medications  Please see past updates related to this goal by clicking on the "Past Updates" button in the selected goal          The care management team will reach out to the patient again over the next 14 days.    Justin Ruiz, BSN, RN-BC Embedded Chronic Care Manager [facility] / [employer] [facility] Direct Dial: [phone]   The patient verbalized understanding of instructions provided today and declined a print copy of patient instruction materials.

## 2019-04-24 NOTE — Chronic Care Management (AMB) (Signed)
Erroneous encounter

## 2019-04-29 ENCOUNTER — Telehealth: Payer: Self-pay | Admitting: Family Medicine

## 2019-04-29 NOTE — Telephone Encounter (Signed)
Pt's wife called requesting that we send a letter stating that pt is able to drive and if its restricted or emergency only, then it needs to say that.

## 2019-04-30 ENCOUNTER — Encounter: Payer: Self-pay | Admitting: Family Medicine

## 2019-04-30 NOTE — Telephone Encounter (Signed)
Aware.  Copy of letter mailed to patient.

## 2019-04-30 NOTE — Telephone Encounter (Signed)
Done. Thanks, WS 

## 2019-05-06 ENCOUNTER — Other Ambulatory Visit: Payer: Self-pay

## 2019-05-06 ENCOUNTER — Ambulatory Visit (INDEPENDENT_AMBULATORY_CARE_PROVIDER_SITE_OTHER): Payer: Medicare Other | Admitting: Internal Medicine

## 2019-05-06 ENCOUNTER — Encounter: Payer: Self-pay | Admitting: Internal Medicine

## 2019-05-06 DIAGNOSIS — J302 Other seasonal allergic rhinitis: Secondary | ICD-10-CM

## 2019-05-06 DIAGNOSIS — J3089 Other allergic rhinitis: Secondary | ICD-10-CM

## 2019-05-06 DIAGNOSIS — J9611 Chronic respiratory failure with hypoxia: Secondary | ICD-10-CM

## 2019-05-06 DIAGNOSIS — G4733 Obstructive sleep apnea (adult) (pediatric): Secondary | ICD-10-CM

## 2019-05-06 NOTE — Progress Notes (Signed)
HPI male former smoker followed for COPD/chronic hypoxic respiratory failure, OSA, lung nodules, allergic rhinitis, complicated by history lymphoma, DM, A. fib/Coumadin, morbid obesity NPSG 04/07/14-  AHI 75.1/ hr, desaturation to 79% ( O2 was added at 2L), CPAP to  16, Body weight 338 lbs PFT 06/17/14- severe obstructive airways disease with slight response to bronchodilator. FVC 2.03/42%, FEV1 1.02/28%, ratio 0.5, TLC 82%, DLCO 59% PFT 11/10/2017-very severe obstructive airways disease with response to bronchodilator, air trapping, diffusion severely reduced.  FVC 1.65/35%, FEV1 0.79/22%, ratio 0.48, TLC 104%, DLCO 29% -----------------------------------------------------------------   11/01/2018- 69 year-old male former smoker followed for COPD/chronic hypoxic respiratory failure, OSA, allergic rhinitis, lung nodules, complicated by history lymphoma, DM2, A. fib/Eliquis, morbid obesity CPAP auto 12-20/[employer]  O2 3-4 L continuous !!Reports stopping smoking 2 ppd as of 10/18/2018 then started back intermittently 5 cigs/day. Discussed today with wife present- she likely buys him cigs. Body weight today 274 lbs -----pt and pt spouse reports pt's breathing has been worse; more SOB, chest tightness; currently on O2 4L POC, uses 2-3L continuous at home; has CPAP but reports not using it d/t mask issues; pt uses nebulizer q4-6h and rescue inhaler PRN Neb Duoneb, albuterol hfa More SOB last few days- ? Weather. Limited exertion tolerance.Little cough/ phlegm. No fever or blood. Sleeps in recliner but has medical bed.  He has blood work pending for PCP and we will add ours to that draw.   05/06/19- Virtual Visit via Telephone Note  I connected with Justin Ruiz on 05/06/19 at 11:00 AM EST by telephone and verified that I am speaking with the correct person using two identifiers.  Location: Patient: Justin Ruiz: O   I discussed the limitations, risks, security and privacy concerns of performing an  evaluation and management service by telephone and the availability of in person appointments. I also discussed with the patient that there may be a patient responsible charge related to this service. The patient expressed understanding and agreed to proceed.   History of Present Illness: 69 year-old male  smoker followed for COPD/chronic hypoxic respiratory failure, OSA, allergic rhinitis, lung nodules, complicated by history lymphoma, DM2, A. fib/Eliquis, morbid obesity CPAP auto 12-20/[employer]  O2 3-4 L continuous Download  Neb Duoneb, albuterol hfa, Trelegy,  Says hasn't been able to use CPAP because he needs a mask. Continues O2. Says breathing has been "fine" with no exacerbation.  Observations/Objective: CTa chest 11/08/2018-  IMPRESSION: 1. Technically adequate exam showing no acute pulmonary embolus. 2. Trace LEFT pleural effusion. 3. Coronary artery disease. 4. Cholelithiasis. 5. Question of cirrhosis. 6. 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 7.  Aortic atherosclerosis.  (ICD10-I70.0) I  Assessment and Plan: OSA- educated on communication with [place] and DME. Replace mask. Chronic resp fail w hypoxia- continue O2 3-4L  Follow Up Instructions: 6 months   I discussed the assessment and treatment plan with the patient. The patient was provided an opportunity to ask questions and all were answered. The patient agreed with the plan and demonstrated an understanding of the instructions.   The patient was advised to call back or seek an in-person evaluation if the symptoms worsen or if the condition fails to improve as anticipated.  I provided 21 minutes of non-face-to-face time during this encounter.   Baird Lyons, MD     ROS-see HPI      + = positive Constitutional:   No-   weight loss, night sweats, fevers, chills, +fatigue, lassitude. HEENT:   No-  headaches, difficulty swallowing, tooth/dental problems, sore throat,       No-  sneezing, itching, ear ache,  nasal congestion, post nasal drip,  CV:  No-   chest pain, orthopnea, PND, swelling in lower extremities, anasarca,                                                           dizziness, palpitations Resp: + shortness of breath with exertion or at rest.              No-   productive cough,  No non-productive cough,  No- coughing up of blood.              No-   change in color of mucus.   Skin: No-   rash or lesions. GI:  No-   heartburn, indigestion, abdominal pain, nausea, vomiting,  GU:  MS:  No-   joint pain or swelling.  . Neuro-     nothing unusual Psych:  No- change in mood or affect. No depression or anxiety.  No memory loss.  OBJ- Physical Exam     O2 3 L, saturation 98% at rest General- Alert, Oriented, Affect-appropriate, Distress- none acute, +obese Skin- rash-none, lesions- none, excoriation- none Lymphadenopathy- none Head- atraumatic            Eyes- Gross vision intact, PERRLA, conjunctivae and secretions clear,             Ears- Hearing, canals-normal            Nose- Clear, no-Septal dev, mucus, polyps, erosion, perforation             Throat- Mallampati II-III , mucosa clear , drainage- none, tonsils- atrophic Neck- flexible , trachea midline, no stridor , thyroid nl, carotid no bruit Chest - symmetrical excursion , unlabored           Heart/CV- IRR +, no murmur , no gallop  , no rub, nl s1 s2                           - JVD- none , edema- none, stasis changes+, varices- none           Lung- decreased airflow/clear, unlabored, wheeze+mild, upper zpnes , dullness-none, rub- none           Chest wall-  Abd-  Br/ Gen/ Rectal- Not done, not indicated Extrem- cyanosis- none, clubbing, none, atrophy- none, strength- nl Neuro- grossly intact to observation

## 2019-05-06 NOTE — Patient Instructions (Signed)
Order- DME Huey Romans- please replace mask of choice, and supplies Continue auto 12-20, humidifier, AirView/ card  [facility] to continue current meds for breathing  Please call if we can help

## 2019-05-09 ENCOUNTER — Encounter: Payer: Self-pay | Admitting: Internal Medicine

## 2019-05-09 DIAGNOSIS — J9611 Chronic respiratory failure with hypoxia: Secondary | ICD-10-CM | POA: Diagnosis not present

## 2019-05-09 DIAGNOSIS — J449 Chronic obstructive pulmonary disease, unspecified: Secondary | ICD-10-CM | POA: Diagnosis not present

## 2019-05-09 NOTE — Assessment & Plan Note (Signed)
Emphasized importance of contacting us or DME whenever there are problems Plan- continue CPAP 12-20, replace mask and supplies

## 2019-05-09 NOTE — Assessment & Plan Note (Signed)
Continues to benefit from O2 now at 3-4 L continuous.

## 2019-05-12 DIAGNOSIS — J449 Chronic obstructive pulmonary disease, unspecified: Secondary | ICD-10-CM | POA: Diagnosis not present

## 2019-05-13 ENCOUNTER — Ambulatory Visit (INDEPENDENT_AMBULATORY_CARE_PROVIDER_SITE_OTHER): Payer: Medicare Other | Admitting: Licensed Clinical Social Worker

## 2019-05-13 DIAGNOSIS — Z794 Long term (current) use of insulin: Secondary | ICD-10-CM | POA: Diagnosis not present

## 2019-05-13 DIAGNOSIS — I10 Essential (primary) hypertension: Secondary | ICD-10-CM

## 2019-05-13 DIAGNOSIS — E782 Mixed hyperlipidemia: Secondary | ICD-10-CM

## 2019-05-13 DIAGNOSIS — K219 Gastro-esophageal reflux disease without esophagitis: Secondary | ICD-10-CM

## 2019-05-13 DIAGNOSIS — J449 Chronic obstructive pulmonary disease, unspecified: Secondary | ICD-10-CM | POA: Diagnosis not present

## 2019-05-13 DIAGNOSIS — E119 Type 2 diabetes mellitus without complications: Secondary | ICD-10-CM | POA: Diagnosis not present

## 2019-05-13 DIAGNOSIS — F411 Generalized anxiety disorder: Secondary | ICD-10-CM

## 2019-05-13 NOTE — Chronic Care Management (AMB) (Signed)
Care Management Note   Justin Ruiz is a 69 y.o. year old male who is a primary care patient of Stacks, [name], MD. The CM team was consulted for assistance with chronic disease management and care coordination.   I reached out to [employer], spouse, by phone today.   Review of patient status, including review of consultants reports, relevant laboratory and other test results, and collaboration with appropriate care team members and the patient's provider was performed as part of comprehensive patient evaluation and provision of chronic care management services.   Social determinants of health: risk of social isolation; risk of tobacco use; risk of depression ; risk of stress; risk of physical inactivity    Chronic Care Management from 03/04/2019 in [place]  PHQ-9 Total Score  6     GAD 7 : Generalized Anxiety Score 03/04/2019  Nervous, Anxious, on Edge 1  Control/stop worrying 0  Worry too much - different things 0  Trouble relaxing 1  Restless 0  Easily annoyed or irritable 0  Afraid - awful might happen 1  Total GAD 7 Score 3  Anxiety Difficulty Somewhat difficult   Medications   Accu-Chek FastClix Lancets MISC acetaminophen (TYLENOL) 325 MG tablet albuterol (VENTOLIN HFA) 108 (90 Base) MCG/ACT inhaler Alcohol Swabs (B-D SINGLE USE SWABS REGULAR) PADS ALPRAZolam (XANAX) 0.25 MG tablet Blood Glucose Monitoring Suppl (ACCU-CHEK AVIVA PLUS) w/Device KIT Cholecalciferol (VITAMIN D3) 5000 UNITS TABS ELIQUIS 5 MG TABS tablet Fluticasone-Umeclidin-Vilant (TRELEGY ELLIPTA) 100-62.5-25 MCG/INH AEPB furosemide (LASIX) 40 MG tablet glucose blood (ACCU-CHEK AVIVA PLUS) test strip insulin NPH Human (NOVOLIN N RELION) 100 UNIT/ML injection insulin regular (NOVOLIN R) 100 units/mL injection ipratropium-albuterol (DUONEB) 0.5-2.5 (3) MG/3ML SOLN(Expired) Lancet Devices (LANCING DEVICE) MISC metFORMIN (GLUCOPHAGE) 500 MG tablet metoprolol  tartrate (LOPRESSOR) 100 MG tablet PARoxetine (PAXIL) 20 MG tablet Semaglutide, 1 MG/DOSE, (OZEMPIC, 1 MG/DOSE,) 2 MG/1.5ML SOPN simvastatin (ZOCOR) 40 MG tablet  Goals    . Client will talk with LCSW in next 30 days to discuss anxiety, stress issues of client (pt-stated)     Current Barriers:  . Anxiety issues in client with Chronic Diagnoses of GAD, COPD,DM Type 2, HLD, HTN, and GERD . Breathing issues . Irregular meal schedule  Clinical Social Work Clinical Goal(s):  . Client will talk with LCSW in next 30 days to discus anxiety and stress issues of client  Interventions:  . Encouraged [name] and spouse to call RNCM to discuss nursing issues of client . Previously discussed anxiety management for client . Previously discussed relaxation techniques of client (client likes to watch TV, talks with [name], and friend from church  . Talked with [name] about weight issue of client . Talked with [name] about mobility issues of client  . Talked with [name] about oxygen use of client  Talked with [name] about client upcoming appointments  Patient Self Care Activities:  . Attends scheduled medical appointments . Takes medications as prescribed  Plan:    Client to attend scheduled medical appointments LCSW to call client or spouse of client  in next 4 weeks to talk with client about his anxiety and stress symptoms management.  Client or spouse to talk with RNCM as needed to discuss nursing needs  Initial goal documentation       Follow Up Plan: LCSW to call client or spouse of client in next 4 weeks to talk about client management of anxiety or stress symptoms faced by client  Norva Riffle.Haylen Shelnutt MSW, [employer] Licensed  Clinical Social Worker [facility] [phone] 

## 2019-05-13 NOTE — Patient Instructions (Addendum)
Licensed Clinical Social Worker Visit Information  Goals we discussed today:  Goals    . Client will talk with LCSW in next 30 days to discuss anxiety, stress issues of client (pt-stated)     Current Barriers:  . Anxiety issues in client with Chronic Diagnoses of GAD, COPD,DM Type 2, HLD, HTN, and GERD . Breathing issues . Irregular meal schedule  Clinical Social Work Clinical Goal(s):  . Client will talk with LCSW in next 30 days to discus anxiety and stress issues of client  Interventions:  Encouraged Justin Ruiz and spouse to call RNCM to discuss nursing issues of client  Previously discussed anxiety management for client  Previously discussed relaxation techniques of client (client likes to watch TV, talks with Justin Ruiz, and friend from church  Talked with Justin Ruiz about weight issue of client  Talked with Justin Ruiz about mobility issues of client  Talked with Justin Ruiz about oxygen use of client Talked with Justin Ruiz about client upcoming appointments  Patient Self Care Activities:  . Attends scheduled medical appointments . Takes medications as prescribed  Plan:    Client to attend scheduled medical appointments LCSW to call client or spouse of client  in next 4 weeks to talk with client about his anxiety and stress symptoms management.  Client or spouse to talk with RNCM as needed to discuss nursing needs  Initial goal documentation        Materials Provided: No  Follow Up Plan:  LCSW to call client or spouse of client in next 4 weeks to talk about client management of anxiety and stress symptoms faced  The patient Justin Ruiz verbalized understanding of instructions provided today and declined a print copy of patient instruction materials.   Norva Riffle.Justin Ruiz MSW, LCSW Licensed Clinical Social Worker [facility] Care Management [phone]

## 2019-05-17 ENCOUNTER — Telehealth: Payer: Medicare Other

## 2019-05-21 ENCOUNTER — Telehealth: Payer: Self-pay | Admitting: Internal Medicine

## 2019-05-21 ENCOUNTER — Other Ambulatory Visit: Payer: Self-pay | Admitting: Family Medicine

## 2019-05-21 NOTE — Telephone Encounter (Signed)
Rescheduled per 1/26 sch msg. Called and spoke with [name] (wife), confirmed appts

## 2019-05-24 ENCOUNTER — Ambulatory Visit: Payer: Medicare Other | Admitting: *Deleted

## 2019-05-24 NOTE — Chronic Care Management (AMB) (Signed)
  Chronic Care Management   Outreach Note  05/24/2019 Name: DUVAN BENZIE MRN: [id number] DOB: 1950/08/03  Referred by: [name], MD Reason for referral : Chronic Care Management (RN Follow up)   An unsuccessful telephone follow-up was attempted today. The patient was referred to the case management team by for assistance with care management and care coordination.   Follow Up Plan: The care management team will reach out to the patient again over the next 14 days.   [name], BSN, RN-BC Embedded Chronic Care Manager [facility] [place] [facility] Direct Dial: [phone]

## 2019-06-07 ENCOUNTER — Other Ambulatory Visit: Payer: Self-pay | Admitting: Pharmacist

## 2019-06-07 ENCOUNTER — Ambulatory Visit (INDEPENDENT_AMBULATORY_CARE_PROVIDER_SITE_OTHER): Payer: Medicare Other | Admitting: *Deleted

## 2019-06-07 DIAGNOSIS — Z794 Long term (current) use of insulin: Secondary | ICD-10-CM

## 2019-06-07 DIAGNOSIS — I4891 Unspecified atrial fibrillation: Secondary | ICD-10-CM

## 2019-06-07 DIAGNOSIS — Z7901 Long term (current) use of anticoagulants: Secondary | ICD-10-CM

## 2019-06-07 DIAGNOSIS — E119 Type 2 diabetes mellitus without complications: Secondary | ICD-10-CM

## 2019-06-07 NOTE — Patient Instructions (Signed)
Visit Information  Goals Addressed            This Visit's Progress     Patient Stated   . Diabetes Management (pt-stated)       Current Barriers:  . Chronic Disease Management support and education needs related to diabetes  Nurse Case Manager Clinical Goal(s):  Marland Kitchen Over the next 14 days, patient will work with [place] Team regarding diabetes medication management . Over the next 7 days, patient will schedule an appointment with PCP for follow-up and A1C check  Interventions:  . Talked with patient by telephone . Chart reviewed including recent office notes and labs . Advised patient that he is due for a follow-up with Dr Livia Snellen and an A1C. Requested that he have his wife call back to schedule an appointment.  . Referral to [place] Team placed for assistance with simplifying diabetes medication regimen . Reviewed current medications and discussed Ozempic, Novolin, and Metformin o Patient "just ran out" of Ozempic . Discussed blood sugar checks o Patient is checking 2 to 3 times a day or whenever he thinks about it o Last reading was yesterday afternoon and it was 158. He has not checked it this morning. . Encouraged patient to check and record blood sugar 4 times a day as instructed . Advised patient to reach out to PCP at [phone] with any readings outside of recommended range . Consider Freestyle libre for blood sugar monitoring  Patient Self Care Activities:  . Performs ADL's independently . Has help from his wife to manage his medical conditions and medications  Please see past updates related to this goal by clicking on the "Past Updates" button in the selected goal      . 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  . Chronic Disease Management support and education needs related to Afib  Nurse Case Manager Clinical Goal(s):  Marland Kitchen Over the  next 30 days, patient will work with Consulting civil engineer to complete patient assistance application for BMS   Interventions:  . Talked with patient by telephone o Reports that he has enough Eliquis  o Was unsure of the patient assistance application status . RN will follow-up with wife, [name], during our next telephone call  Patient Self Care Activities:  . Performs ADL's independently . Performs IADL's independently . Wife assists with medications and health maintenance  Please see past updates related to this goal by clicking on the "Past Updates" button in the selected goal        The care management team will reach out to the patient again over the next 14 days.    [name], BSN, RN-BC Embedded Chronic Care Manager [facility] / [place] Management Direct Dial: [phone]   The patient verbalized understanding of instructions provided today and declined a print copy of patient instruction materials.

## 2019-06-07 NOTE — Patient Outreach (Signed)
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   HPI: Patient is a 69 year old male with the above mentioned medical conditions. He reported having issues affording Ozempic.  Objective: Allergies  Allergen Reactions  . Avapro [Irbesartan] Other (See Comments)    "doesnt sit right with me"--light headed  . Lipitor [Atorvastatin Calcium] Other (See Comments)    Leg pain    Medications Reviewed Today    Reviewed by [name], [facility] (Pharmacist) on 06/07/19 at 1639  Med List Status: <None>  Medication Order Taking? Sig Documenting Provider Last Dose Status Informant  Accu-Chek FastClix Lancets MISC [id number] Yes Test BS QID Dx E11.9 [name], MD Taking Active   acetaminophen (TYLENOL) 325 MG tablet [id number] Yes Take 650 mg by mouth every 6 (six) hours as needed. [provider] Taking Active   albuterol (VENTOLIN HFA) 108 (90 Base) MCG/ACT inhaler [id number] Yes Inhale 2 puffs into the lungs every 6 (six) hours as needed for wheezing or shortness of breath. Baird Lyons D, MD Taking Active   Alcohol Swabs (B-D SINGLE USE SWABS REGULAR) PADS [id number] Yes Test BS QID Dx E11.9 [name], MD Taking Active   ALPRAZolam Duanne Moron) 0.25 MG tablet [id number] Yes Take 1 tablet (0.25 mg total) by mouth 3 (three) times daily as needed. for anxiety [name], MD Taking Active   Blood Glucose Monitoring Suppl (ACCU-CHEK AVIVA PLUS) w/Device KIT [id number] Yes Test BS QID DX E11.9 [name], MD Taking Active   Cholecalciferol (VITAMIN D3) 5000 UNITS TABS [id number] Yes Take 1  tablet by mouth every morning. [provider] Taking Active Self  ELIQUIS 5 MG TABS tablet [id number] Yes Take 1 tablet by mouth twice daily [name], MD Taking Active   Fluticasone-Umeclidin-Vilant (TRELEGY ELLIPTA) 100-62.5-25 MCG/INH AEPB [id number] No Inhale 1 puff into the lungs daily. Rinse mouth well  Patient not taking: Reported on 06/07/2019   Baird Lyons D, MD Not Taking Active   furosemide (LASIX) 40 MG tablet [id number] Yes TAKE 1 TABLET BY MOUTH TWICE DAILY AS DIRECTED [name], MD Taking Active   glucose blood (ACCU-CHEK AVIVA PLUS) test strip [id number] Yes Test BS QID Dx E11.9 [name], MD Taking Active   insulin NPH Human (NOVOLIN N RELION) 100 UNIT/ML injection [id number] Yes Inject 0.55 mLs (55 Units total) into the skin 2 (two) times daily before a meal. [name], MD Taking Active   insulin regular (NOVOLIN R) 100 units/mL injection [id number] Yes Inject 15 units before breakfast and supper [name], MD Taking Active   ipratropium-albuterol (DUONEB) 0.5-2.5 (3) MG/3ML SOLN [id number]  Take 3 mLs by nebulization every 6 (six) hours as needed. Baird Lyons D, MD  Expired 02/27/19 2359   Lancet Devices (LANCING DEVICE) MISC [id number] Yes USE AS DIRECTED Chipper Herb, MD Taking Active Self  metFORMIN (GLUCOPHAGE) 500 MG tablet [id number] Yes TAKE 1 TABLET BY MOUTH IN THE MORNING AND 2 IN THE Caesar Chestnut, MD Taking Active   metoprolol tartrate (LOPRESSOR) 100 MG tablet [id number] Yes Take 1 tablet by mouth twice daily [name], MD Taking Active   PARoxetine (PAXIL) 20 MG tablet [id number] Yes Take 1 tablet by  mouth once daily [name], MD Taking Active   Semaglutide, 1 MG/DOSE, (OZEMPIC, 1 MG/DOSE,) 2 MG/1.5ML SOPN [id number] Yes Inject 1 mg into the skin once a week. [name], MD Taking Active   simvastatin (ZOCOR) 40 MG tablet [id number] Yes Take 1 tablet by mouth once daily [name], MD Taking Active            Assessment:  Drugs sorted by system:  Neurologic/Psychologic: Alprazolam, Paroxetine,   Cardiovascular: Eliquis, Furosemide, Metoprolol Tartrate, Simvastatin  Pulmonary/Allergy: Ventolin HFA, Trelegy, Ipratropium-Albuterol nebulizer solution,   Endocrine: Novolin N, Novolin R, Metformin, Ozempic  Pain: Acetaminophen  Vitamins/Minerals/Supplements: Cholecalciferol   Medication Review Findings:  HGBA1C 9.9 (H) 02/20/2019  HGBA1C >14.0 (H) 11/05/2018  HGBA1C 11.6 (H) 05/30/2018    Medication Assistance Findings:  Medication assistance needs identified: Trelegy, Novolin N and R, Ozempic  Trelegy/Ventolin HFA-[employer]-requires patients to spend at least $600  Novolin N & Novolin R- if substituted for Relion N and R    Additional medication assistance options reviewed with patient as warranted:  No other options identified  Plan: I will route patient assistance letter to [place] technician who will coordinate patient assistance program application process for medications listed above.  [facility] pharmacy technician will assist with obtaining all required documents from both patient and provider(s) and submit application(s) once completed.    Routed note to [place] in the patient's practice.  Follow up in 3-4 weeks.  [name], PharmD, [place] Clinical Pharmacist [phone]

## 2019-06-07 NOTE — Chronic Care Management (AMB) (Signed)
Chronic Care Management   Follow Up Note   06/07/2019 Name: Justin Ruiz MRN: [id number] DOB: 16-Oct-1950  Referred by: [name], MD Reason for referral : Chronic Care Management (RN Follow up)   Justin Ruiz is a 69 y.o. year old male who is a primary care patient of Stacks, [name], MD. The CCM team was consulted for assistance with chronic disease management and care coordination needs.    Review of patient status, including review of consultants reports, relevant laboratory and other test results, and collaboration with appropriate care team members and the patient's provider was performed as part of comprehensive patient evaluation and provision of chronic care management services.    Subjective: I spoke with [name] by telephone today regarding his diabetes management.   Objective: Outpatient Encounter Medications as of 06/07/2019  Medication Sig  . Accu-Chek FastClix Lancets MISC Test BS QID Dx E11.9  . acetaminophen (TYLENOL) 325 MG tablet Take 650 mg by mouth every 6 (six) hours as needed.  Marland Kitchen albuterol (VENTOLIN HFA) 108 (90 Base) MCG/ACT inhaler Inhale 2 puffs into the lungs every 6 (six) hours as needed for wheezing or shortness of breath.  . Alcohol Swabs (B-D SINGLE USE SWABS REGULAR) PADS Test BS QID Dx E11.9  . ALPRAZolam (XANAX) 0.25 MG tablet Take 1 tablet (0.25 mg total) by mouth 3 (three) times daily as needed. for anxiety  . Blood Glucose Monitoring Suppl (ACCU-CHEK AVIVA PLUS) w/Device KIT Test BS QID DX E11.9  . Cholecalciferol (VITAMIN D3) 5000 UNITS TABS Take 1 tablet by mouth every morning.  Marland Kitchen ELIQUIS 5 MG TABS tablet Take 1 tablet by mouth twice daily  . Fluticasone-Umeclidin-Vilant (TRELEGY ELLIPTA) 100-62.5-25 MCG/INH AEPB Inhale 1 puff into the lungs daily. Rinse mouth well  . furosemide (LASIX) 40 MG tablet TAKE 1 TABLET BY MOUTH TWICE DAILY AS DIRECTED  . glucose blood (ACCU-CHEK AVIVA PLUS) test strip Test BS QID Dx E11.9  . insulin NPH Human  (NOVOLIN N RELION) 100 UNIT/ML injection Inject 0.55 mLs (55 Units total) into the skin 2 (two) times daily before a meal.  . insulin regular (NOVOLIN R) 100 units/mL injection Inject 15 units before breakfast and supper  . ipratropium-albuterol (DUONEB) 0.5-2.5 (3) MG/3ML SOLN Take 3 mLs by nebulization every 6 (six) hours as needed.  Elmore Guise Devices (LANCING DEVICE) MISC USE AS DIRECTED  . metFORMIN (GLUCOPHAGE) 500 MG tablet TAKE 1 TABLET BY MOUTH IN THE MORNING AND 2 IN THE EVENING  . metoprolol tartrate (LOPRESSOR) 100 MG tablet Take 1 tablet by mouth twice daily  . PARoxetine (PAXIL) 20 MG tablet Take 1 tablet by mouth once daily  . Semaglutide, 1 MG/DOSE, (OZEMPIC, 1 MG/DOSE,) 2 MG/1.5ML SOPN Inject 1 mg into the skin once a week.  . simvastatin (ZOCOR) 40 MG tablet Take 1 tablet by mouth once daily   No facility-administered encounter medications on file as of 06/07/2019.     Lab Results  Component Value Date   HGBA1C 9.9 (H) 02/20/2019   HGBA1C >14.0 (H) 11/05/2018   HGBA1C 11.6 (H) 05/30/2018   Lab Results  Component Value Date   MICROALBUR 20 11/28/2013   LDLCALC 77 02/20/2019   CREATININE 1.34 (H) 02/20/2019      RN Assessment & Care Plan   . Diabetes Management        Current Barriers:  . Chronic Disease Management support and education needs related to diabetes  Nurse Case Manager Clinical Goal(s):  Marland Kitchen Over the next 14 days,  patient will work with [place] Team regarding diabetes medication management . Over the next 7 days, patient will schedule an appointment with PCP for follow-up and A1C check  Interventions:  . Talked with patient by telephone . Chart reviewed including recent office notes and labs . Advised patient that he is due for a follow-up with Dr Livia Snellen and an A1C. Requested that he have his wife call back to schedule an appointment.  . Referral to [place] Team placed for assistance with simplifying diabetes medication regimen . Reviewed  current medications and discussed Ozempic, Novolin, and Metformin o Patient "just ran out" of Ozempic . Discussed blood sugar checks o Patient is checking 2 to 3 times a day or whenever he thinks about it o Last reading was yesterday afternoon and it was 158. He has not checked it this morning. . Encouraged patient to check and record blood sugar 4 times a day as instructed . Advised patient to reach out to PCP at [phone] with any readings outside of recommended range . Consider Freestyle libre for blood sugar monitoring  Patient Self Care Activities:  . Performs ADL's independently . Has help from his wife to manage his medical conditions and medications  Please see past updates related to this goal by clicking on the "Past Updates" button in the selected goal      . 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  . Chronic Disease Management support and education needs related to Afib  Nurse Case Manager Clinical Goal(s):  Marland Kitchen Over the next 30 days, patient will work with Consulting civil engineer to complete patient assistance application for BMS   Interventions:  . Talked with patient by telephone o Reports that he has enough Eliquis  o Was unsure of the patient assistance application status . RN will follow-up with wife, [name], during our next telephone call  Patient Self Care Activities:  . Performs ADL's independently . Performs IADL's independently . Wife assists with medications and health maintenance  Please see past updates related to this goal by clicking on the "Past Updates" button in the selected goal          Follow-up Plan:   The care management team will reach out to the patient again over the next 14 days.   [name], BSN, RN-BC Embedded Chronic Care Manager [facility] / [place] Management Direct Dial: [phone]

## 2019-06-09 DIAGNOSIS — J449 Chronic obstructive pulmonary disease, unspecified: Secondary | ICD-10-CM | POA: Diagnosis not present

## 2019-06-11 ENCOUNTER — Ambulatory Visit: Payer: Self-pay | Admitting: Licensed Clinical Social Worker

## 2019-06-11 ENCOUNTER — Telehealth: Payer: Self-pay | Admitting: *Deleted

## 2019-06-11 ENCOUNTER — Other Ambulatory Visit: Payer: Self-pay | Admitting: Pharmacy Technician

## 2019-06-11 ENCOUNTER — Ambulatory Visit: Payer: Medicare Other | Admitting: *Deleted

## 2019-06-11 DIAGNOSIS — I10 Essential (primary) hypertension: Secondary | ICD-10-CM

## 2019-06-11 DIAGNOSIS — E119 Type 2 diabetes mellitus without complications: Secondary | ICD-10-CM

## 2019-06-11 DIAGNOSIS — F411 Generalized anxiety disorder: Secondary | ICD-10-CM

## 2019-06-11 DIAGNOSIS — E782 Mixed hyperlipidemia: Secondary | ICD-10-CM

## 2019-06-11 DIAGNOSIS — K219 Gastro-esophageal reflux disease without esophagitis: Secondary | ICD-10-CM

## 2019-06-11 DIAGNOSIS — J449 Chronic obstructive pulmonary disease, unspecified: Secondary | ICD-10-CM

## 2019-06-11 DIAGNOSIS — Z794 Long term (current) use of insulin: Secondary | ICD-10-CM

## 2019-06-11 NOTE — Patient Outreach (Signed)
[place] [facility]) Care Management  06/11/2019  HARTLEY HISLE [birth year] [id number]                                        Medication Assistance Referral  Referral From: [place]  Medication/Company: Karleen Hampshire, Novolin R, Ozempic / [employer] Patient application portion:  Education officer, museum portion: Faxed  to Dr. [name] Provider address/fax verified via: Office website     Follow up:  Will follow up with patient in 5-15 business days to confirm application(s) have been received.  Lafonda Patron P. Danissa Rundle, [place] Management [phone]

## 2019-06-11 NOTE — Chronic Care Management (AMB) (Signed)
Chronic Care Management   Care Coordination Note   06/11/2019 Name: Justin Ruiz MRN: [id number] DOB: Oct 23, 1950  Referred by: [name], MD Reason for referral : Chronic Care Management (Care coordination)   Justin Ruiz is a 69 y.o. year old male who is a primary care patient of Stacks, [name], MD. The CCM team was consulted for assistance with chronic disease management and care coordination needs.    Review of patient status, including review of consultants reports, relevant laboratory and other test results, and collaboration with appropriate care team members and the patient's provider was performed as part of comprehensive patient evaluation and provision of chronic care management services.    Outpatient Encounter Medications as of 06/11/2019  Medication Sig  . Accu-Chek FastClix Lancets MISC Test BS QID Dx E11.9  . acetaminophen (TYLENOL) 325 MG tablet Take 650 mg by mouth every 6 (six) hours as needed.  Marland Kitchen albuterol (VENTOLIN HFA) 108 (90 Base) MCG/ACT inhaler Inhale 2 puffs into the lungs every 6 (six) hours as needed for wheezing or shortness of breath.  . Alcohol Swabs (B-D SINGLE USE SWABS REGULAR) PADS Test BS QID Dx E11.9  . ALPRAZolam (XANAX) 0.25 MG tablet Take 1 tablet (0.25 mg total) by mouth 3 (three) times daily as needed. for anxiety  . Blood Glucose Monitoring Suppl (ACCU-CHEK AVIVA PLUS) w/Device KIT Test BS QID DX E11.9  . Cholecalciferol (VITAMIN D3) 5000 UNITS TABS Take 1 tablet by mouth every morning.  Marland Kitchen ELIQUIS 5 MG TABS tablet Take 1 tablet by mouth twice daily  . Fluticasone-Umeclidin-Vilant (TRELEGY ELLIPTA) 100-62.5-25 MCG/INH AEPB Inhale 1 puff into the lungs daily. Rinse mouth well (Patient not taking: Reported on 06/07/2019)  . furosemide (LASIX) 40 MG tablet TAKE 1 TABLET BY MOUTH TWICE DAILY AS DIRECTED  . glucose blood (ACCU-CHEK AVIVA PLUS) test strip Test BS QID Dx E11.9  . insulin NPH Human (NOVOLIN N RELION) 100 UNIT/ML injection Inject  0.55 mLs (55 Units total) into the skin 2 (two) times daily before a meal.  . insulin regular (NOVOLIN R) 100 units/mL injection Inject 15 units before breakfast and supper  . ipratropium-albuterol (DUONEB) 0.5-2.5 (3) MG/3ML SOLN Take 3 mLs by nebulization every 6 (six) hours as needed.  Elmore Guise Devices (LANCING DEVICE) MISC USE AS DIRECTED  . metFORMIN (GLUCOPHAGE) 500 MG tablet TAKE 1 TABLET BY MOUTH IN THE MORNING AND 2 IN THE EVENING  . metoprolol tartrate (LOPRESSOR) 100 MG tablet Take 1 tablet by mouth twice daily  . PARoxetine (PAXIL) 20 MG tablet Take 1 tablet by mouth once daily  . Semaglutide, 1 MG/DOSE, (OZEMPIC, 1 MG/DOSE,) 2 MG/1.5ML SOPN Inject 1 mg into the skin once a week.  . simvastatin (ZOCOR) 40 MG tablet Take 1 tablet by mouth once daily   No facility-administered encounter medications on file as of 06/11/2019.     RN Care Plan   . COPD Medication Management        Current Barriers:  . Film/video editor.  . Chronic Disease Management support and education needs related to COPD  Nurse Case Manager Clinical Goal(s):  Marland Kitchen Over the next 30 days, patient will work with Consulting civil engineer to obtain affordable COPD management medications  Interventions:  . Discussed current treatment plan . Collaborated with [name] clinical staff. No samples of Trelegy available. Nash Dimmer with [place] team. Pharmacist reviewed and pharmacy tech assisted with patient assistance information.   o Patient will need to spend $600 out-of-pocket before he will  qualify for prescription assistance for Trelegy . Reached out to [facility] pharmacist to see if there are any more cost effective maintenance inhalers that he could be switched to. Awaiting response.  Marland Kitchen RN will talk with patient/wife after receiving information from Pharmacy team  Patient Self Care Activities:  . Self administers medications as prescribed . Performs ADL's independently . Has help from his wife for medical management   Please see past updates related to this goal by clicking on the "Past Updates" button in the selected goal      . Diabetes Management        Current Barriers:  . Chronic Disease Management support and education needs related to diabetes  Nurse Case Manager Clinical Goal(s):  Marland Kitchen Over the next 14 days, patient will work with [name] Team regarding diabetes medication management . Over the next 7 days, patient will schedule an appointment with PCP for follow-up and A1C check  Interventions:  . Collaborated with [facility] pharmacy team o Patient assistance forms completed and sent to PCP office for signature o Alternative medication recommendations were made . RN will verify that forms were received by PCP office and that they have been completed  Patient Self Care Activities:  . Performs ADL's independently . Has help from his wife to manage his medical conditions and medications  Please see past updates related to this goal by clicking on the "Past Updates" button in the selected goal      . 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  . Chronic Disease Management support and education needs related to Afib  Nurse Case Manager Clinical Goal(s):  Marland Kitchen Over the next 30 days, patient will work with Consulting civil engineer to complete patient assistance application for BMS   Interventions:  . Talked with patient by telephone o Reports that he has enough Eliquis  . Collaborated with [place] team regarding Eliquis patient assistance o Patient will need to spend 3% of yearly income on prescriptions before he will qualify for assistance . Will collaborate with PCP office to continue providing samples as available  Patient Self Care Activities:  . Performs ADL's independently . Performs IADL's independently . Wife assists with medications and health maintenance  Please see  past updates related to this goal by clicking on the "Past Updates" button in the selected goal          Follow-up Plan:  RN will collaborate with [facility] pharmacist and follow-up with patient/family over the next 7 days  [name], BSN, RN-BC [place] / [place] Management Direct Dial: [phone]

## 2019-06-11 NOTE — Chronic Care Management (AMB) (Signed)
Care Management Note   Justin Ruiz is a 69 y.o. year old male who is a primary care patient of Stacks, [name], MD. The CM team was consulted for assistance with chronic disease management and care coordination.   I reached out to [employer] by phone today.   Review of patient status, including review of consultants reports, relevant laboratory and other test results, and collaboration with appropriate care team members and the patient's provider was performed as part of comprehensive patient evaluation and provision of chronic care management services.   Social determinants of health: risk of social isolation; risk of tobacco use; risk of depression; risk of stress; risk of physical inactivity    Chronic Care Management from 03/04/2019 in [place]  PHQ-9 Total Score  6     GAD 7 : Generalized Anxiety Score 03/04/2019  Nervous, Anxious, on Edge 1  Control/stop worrying 0  Worry too much - different things 0  Trouble relaxing 1  Restless 0  Easily annoyed or irritable 0  Afraid - awful might happen 1  Total GAD 7 Score 3  Anxiety Difficulty Somewhat difficult   Medications   Accu-Chek FastClix Lancets MISC acetaminophen (TYLENOL) 325 MG tablet albuterol (VENTOLIN HFA) 108 (90 Base) MCG/ACT inhaler Alcohol Swabs (B-D SINGLE USE SWABS REGULAR) PADS ALPRAZolam (XANAX) 0.25 MG tablet Blood Glucose Monitoring Suppl (ACCU-CHEK AVIVA PLUS) w/Device KIT Cholecalciferol (VITAMIN D3) 5000 UNITS TABS ELIQUIS 5 MG TABS tablet Fluticasone-Umeclidin-Vilant (TRELEGY ELLIPTA) 100-62.5-25 MCG/INH AEPB furosemide (LASIX) 40 MG tablet glucose blood (ACCU-CHEK AVIVA PLUS) test strip insulin NPH Human (NOVOLIN N RELION) 100 UNIT/ML injection insulin regular (NOVOLIN R) 100 units/mL injection ipratropium-albuterol (DUONEB) 0.5-2.5 (3) MG/3ML SOLN(Expired) Lancet Devices (LANCING DEVICE) MISC metFORMIN (GLUCOPHAGE) 500 MG tablet metoprolol tartrate (LOPRESSOR) 100  MG tablet PARoxetine (PAXIL) 20 MG tablet Semaglutide, 1 MG/DOSE, (OZEMPIC, 1 MG/DOSE,) 2 MG/1.5ML SOPN simvastatin (ZOCOR) 40 MG tablet  Goals    . Client will talk with LCSW in next 30 days to discuss anxiety, stress issues of client (pt-stated)     Current Barriers:  . Anxiety issues in client with Chronic Diagnoses of GAD, COPD,DM Type 2, HLD, HTN, and GERD . Breathing issues . Irregular meal schedule  Clinical Social Work Clinical Goal(s):  . Client will talk with LCSW in next 30 days to discus anxiety and stress issues of client  Interventions:  Encouraged [name] and spouse to call RNCM to discuss nursing issues of client  Previously discussed anxiety management for client  Previously discussed relaxation techniques of client (client likes to watch TV, talks with [name], and friend from church   Talked with [name] about weight issue of client  Talked with [name] about mobility issues of client   Talked with [name] about oxygen use of client . Talked with [name] about client upcoming appointments  . Talked with [name] about medication costs for client . Talked with [name] about client weakness, tiredness upon exersion . Collaborated with RNCM regarding nursing needs of client  Patient Self Care Activities:  . Attends scheduled medical appointments . Takes medications as prescribed  Plan:    Client to attend scheduled medical appointments LCSW to call client or spouse of client  in next 4 weeks to talk with client about his anxiety and stress symptoms management.  Client or spouse to talk with RNCM as needed to discuss nursing needs  Initial goal documentation     Follow Up Plan: LCSW to call client/spouse in next 4 weeks  to talk about client management of anxiety and stress symptoms  Norva Riffle.Ryzen Deady MSW, LCSW Licensed Clinical Social Worker [facility] Care Management [phone]

## 2019-06-11 NOTE — Telephone Encounter (Signed)
06/11/2019 Patient is unable to afford Trelegy because the copay is over $100. He wouldn't qualify for patient prescription assistance until he has spent at least $600 out-of-pocket.   I talked with [facility] Pharmacist Denyse Amass about our options. She said that if Dr Livia Snellen is willing to d/c Trelegy and replace with Ruthe Mannan and Spiriva, that those manufacturers do offer patient prescription assistance and do not have an out-of-pocket requirement for the patient.  The options are Spiriva 18 mcg inhalation in combination with Dulera 100-5 or 200-5. I will forward to Dr Livia Snellen for review and approval.  [name], BSN, RN-BC [place] / [place] Management Direct Dial: [phone]

## 2019-06-11 NOTE — Chronic Care Management (AMB) (Signed)
Addendum:  RN Care Plan   . COPD Medication Management (pt-stated)       Current Barriers:  . Film/video editor.  . Chronic Disease Management support and education needs related to COPD  Nurse Case Manager Clinical Goal(s):  Marland Kitchen Over the next 30 days, patient will work with Consulting civil engineer to obtain affordable COPD management medications  Interventions:  . Discussed current treatment plan . Collaborated with [name] clinical staff. No samples of Trelegy available. Nash Dimmer with [place] team. Pharmacist reviewed and pharmacy tech assisted with patient assistance information.   o Patient will need to spend $600 out-of-pocket before he will qualify for prescription assistance for Trelegy . Collaborated with [facility], Denyse Amass. She recommended switching from Trelegy to [facility] and Spiriva. They both have a prescription assistance program and neither require an out-of-pocket amount to be reached.  Jeralene Huff out to Dr Livia Snellen with this information and am awaiting his reply.  . Will f/u with patient/wife once I have more information  Patient Self Care Activities:  . Self administers medications as prescribed . Performs ADL's independently . Has help from his wife for medical management  Please see past updates related to this goal by clicking on the "Past Updates" button in the selected goal      [name], BSN, RN-BC [place] / [place]: [phone]

## 2019-06-11 NOTE — Patient Instructions (Signed)
RN Care Plan   . COPD Medication Management        Current Barriers:  . Film/video editor.  . Chronic Disease Management support and education needs related to COPD  Nurse Case Manager Clinical Goal(s):  Marland Kitchen Over the next 30 days, patient will work with Consulting civil engineer to obtain affordable COPD management medications  Interventions:  . Discussed current treatment plan . Collaborated with [name] clinical staff. No samples of Trelegy available. Nash Dimmer with [place] team. Pharmacist reviewed and pharmacy tech assisted with patient assistance information.   o Patient will need to spend $600 out-of-pocket before he will qualify for prescription assistance for Trelegy . Reached out to [facility] pharmacist to see if there are any more cost effective maintenance inhalers that he could be switched to. Awaiting response.  Marland Kitchen RN will talk with patient/wife after receiving information from Pharmacy team  Patient Self Care Activities:  . Self administers medications as prescribed . Performs ADL's independently . Has help from his wife for medical management  Please see past updates related to this goal by clicking on the "Past Updates" button in the selected goal      . Diabetes Management        Current Barriers:  . Chronic Disease Management support and education needs related to diabetes  Nurse Case Manager Clinical Goal(s):  Marland Kitchen Over the next 14 days, patient will work with [place] Team regarding diabetes medication management . Over the next 7 days, patient will schedule an appointment with PCP for follow-up and A1C check  Interventions:  . Collaborated with [facility] pharmacy team o Patient assistance forms completed and sent to PCP office for signature o Alternative medication recommendations were made . RN will verify that forms were received by PCP office and that they have been completed  Patient Self Care Activities:  . Performs ADL's independently . Has help from his wife to  manage his medical conditions and medications  Please see past updates related to this goal by clicking on the "Past Updates" button in the selected goal      . 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  . Chronic Disease Management support and education needs related to Afib  Nurse Case Manager Clinical Goal(s):  Marland Kitchen Over the next 30 days, patient will work with Consulting civil engineer to complete patient assistance application for BMS   Interventions:  . Talked with patient by telephone o Reports that he has enough Eliquis  . Collaborated with [place] team regarding Eliquis patient assistance o Patient will need to spend 3% of yearly income on prescriptions before he will qualify for assistance . Will collaborate with PCP office to continue providing samples as available  Patient Self Care Activities:  . Performs ADL's independently . Performs IADL's independently . Wife assists with medications and health maintenance  Please see past updates related to this goal by clicking on the "Past Updates" button in the selected goal          Follow-up Plan:  RN will collaborate with [facility] pharmacist and follow-up with patient/family over the next 7 days  [name], BSN, RN-BC [place] / [place] Management Direct Dial: [phone]

## 2019-06-11 NOTE — Patient Instructions (Addendum)
Licensed Clinical Social Worker Visit Information  Goals we discussed today:  Goals    . Client will talk with LCSW in next 30 days to discuss anxiety, stress issues of client (pt-stated)     Current Barriers:  . Anxiety issues in client with Chronic Diagnoses of GAD, COPD,DM Type 2, HLD, HTN, and GERD . Breathing issues . Irregular meal schedule  Clinical Social Work Clinical Goal(s):  . Client will talk with LCSW in next 30 days to discus anxiety and stress issues of client  Interventions: Encouraged [name] and spouse to call RNCM to discuss nursing issues of client  Previously discussed anxiety management for client  Previously discussed relaxation techniques of client (client likes to watch TV, talks with [name], and friend from church  Talked with [name] about weight issue of client  Talked with [name] about mobility issues of client  Talked with [name] about oxygen use of client Talked with [name] about client upcoming appointments  Talked with [name] about medication costs for client  Talked with [name] about client weakness, tiredness upon exersion  Collaborated with [facility] regarding nursing needs of client  Patient Self Care Activities:  . Attends scheduled medical appointments . Takes medications as prescribed  Plan:    Client to attend scheduled medical appointments LCSW to call client or spouse of client  in next 4 weeks to talk with client about his anxiety and stress symptoms management.  Client or spouse to talk with RNCM as needed to discuss nursing needs  Initial goal documentation           Materials Provided: No  Follow Up Plan: LCSW to call client/spouse in next 4 weeks to talk about client management of anxiety or stress symptoms  The patient/Pauline Carano, spouse,  verbalized understanding of instructions provided today and declined a print copy of patient instruction materials.   Norva Riffle.Naryah Clenney MSW, LCSW Licensed Clinical Social  Worker [facility] Care Management [phone]

## 2019-06-12 ENCOUNTER — Other Ambulatory Visit: Payer: Self-pay | Admitting: *Deleted

## 2019-06-12 ENCOUNTER — Other Ambulatory Visit: Payer: Self-pay | Admitting: Pharmacy Technician

## 2019-06-12 ENCOUNTER — Encounter: Payer: Self-pay | Admitting: Pharmacist

## 2019-06-12 ENCOUNTER — Ambulatory Visit: Payer: Medicare Other | Admitting: *Deleted

## 2019-06-12 ENCOUNTER — Other Ambulatory Visit: Payer: Self-pay | Admitting: Family Medicine

## 2019-06-12 ENCOUNTER — Other Ambulatory Visit: Payer: Self-pay | Admitting: Pharmacist

## 2019-06-12 DIAGNOSIS — Z794 Long term (current) use of insulin: Secondary | ICD-10-CM

## 2019-06-12 DIAGNOSIS — I4891 Unspecified atrial fibrillation: Secondary | ICD-10-CM

## 2019-06-12 DIAGNOSIS — E119 Type 2 diabetes mellitus without complications: Secondary | ICD-10-CM | POA: Diagnosis not present

## 2019-06-12 DIAGNOSIS — J449 Chronic obstructive pulmonary disease, unspecified: Secondary | ICD-10-CM

## 2019-06-12 DIAGNOSIS — E782 Mixed hyperlipidemia: Secondary | ICD-10-CM | POA: Diagnosis not present

## 2019-06-12 DIAGNOSIS — I10 Essential (primary) hypertension: Secondary | ICD-10-CM | POA: Diagnosis not present

## 2019-06-12 MED ORDER — SPIRIVA RESPIMAT 2.5 MCG/ACT IN AERS: 1.0000 | INHALATION_SPRAY | Freq: Every day | RESPIRATORY_TRACT | 11 refills | Status: DC

## 2019-06-12 MED ORDER — MOMETASONE FURO-FORMOTEROL FUM 200-5 MCG/ACT IN AERO: 2.0000 | INHALATION_SPRAY | Freq: Two times a day (BID) | RESPIRATORY_TRACT | 11 refills | Status: DC

## 2019-06-12 NOTE — Chronic Care Management (AMB) (Signed)
Chronic Care Management   Follow Up Note   06/12/2019 Name: Justin Ruiz MRN: [id number] DOB: [birth year]  Referred by: Justin Fraise, MD Reason for referral : Chronic Care Management (RN follow up)   Justin Ruiz is a 69 y.o. year old male who is a primary care patient of Justin Ruiz, Justin Gash, MD. The CCM team was consulted for assistance with chronic disease management and care coordination needs.    Review of patient status, including review of consultants reports, relevant laboratory and other test results, and collaboration with appropriate care team members and the patient's provider was performed as part of comprehensive patient evaluation and provision of chronic care management services.     Outpatient Encounter Medications as of 06/12/2019  Medication Sig  . Accu-Chek FastClix Lancets MISC Test BS QID Dx E11.9  . acetaminophen (TYLENOL) 325 MG tablet Take 650 mg by mouth every 6 (six) hours as needed.  Marland Kitchen albuterol (VENTOLIN HFA) 108 (90 Base) MCG/ACT inhaler Inhale 2 puffs into the lungs every 6 (six) hours as needed for wheezing or shortness of breath.  . Alcohol Swabs (B-D SINGLE USE SWABS REGULAR) PADS Test BS QID Dx E11.9  . ALPRAZolam (XANAX) 0.25 MG tablet Take 1 tablet (0.25 mg total) by mouth 3 (three) times daily as needed. for anxiety  . Blood Glucose Monitoring Suppl (ACCU-CHEK AVIVA PLUS) w/Device KIT Test BS QID DX E11.9  . Cholecalciferol (VITAMIN D3) 5000 UNITS TABS Take 1 tablet by mouth every morning.  Marland Kitchen ELIQUIS 5 MG TABS tablet Take 1 tablet by mouth twice daily  . furosemide (LASIX) 40 MG tablet TAKE 1 TABLET BY MOUTH TWICE DAILY AS DIRECTED  . glucose blood (ACCU-CHEK AVIVA PLUS) test strip Test BS QID Dx E11.9  . insulin NPH Human (NOVOLIN N RELION) 100 UNIT/ML injection Inject 0.55 mLs (55 Units total) into the skin 2 (two) times daily before a meal.  . insulin regular (NOVOLIN R) 100 units/mL injection Inject 15 units before breakfast and supper  .  ipratropium-albuterol (DUONEB) 0.5-2.5 (3) MG/3ML SOLN Take 3 mLs by nebulization every 6 (six) hours as needed.  Elmore Guise Devices (LANCING DEVICE) MISC USE AS DIRECTED  . metFORMIN (GLUCOPHAGE) 500 MG tablet TAKE 1 TABLET BY MOUTH IN THE MORNING AND 2 IN THE EVENING  . metoprolol tartrate (LOPRESSOR) 100 MG tablet Take 1 tablet by mouth twice daily  . mometasone-formoterol (DULERA) 200-5 MCG/ACT AERO Inhale 2 puffs into the lungs 2 (two) times daily.  Marland Kitchen PARoxetine (PAXIL) 20 MG tablet Take 1 tablet by mouth once daily  . Semaglutide, 1 MG/DOSE, (OZEMPIC, 1 MG/DOSE,) 2 MG/1.5ML SOPN Inject 1 mg into the skin once a week.  . simvastatin (ZOCOR) 40 MG tablet Take 1 tablet by mouth once daily  . Tiotropium Bromide Monohydrate (SPIRIVA RESPIMAT) 2.5 MCG/ACT AERS Inhale 1 puff into the lungs daily.   No facility-administered encounter medications on file as of 06/12/2019.    RN Care Plan   . COPD Medication Management        Current Barriers:  . Film/video editor.  . Chronic Disease Management support and education needs related to COPD  Nurse Case Manager Clinical Goal(s):  Marland Kitchen Over the next 30 days, patient will work with Consulting civil engineer to obtain affordable COPD management medications  Interventions:  . Discussed current treatment plan . Collaborated with [name] clinical staff. No samples of Trelegy available. Nash Dimmer with [place] team. Pharmacist reviewed and pharmacy tech assisted with patient assistance information.  o Patient will need to spend $600 out-of-pocket before he will qualify for prescription assistance for Trelegy . Collaborated with Dr Livia Snellen to prescribe Ruthe Mannan and Spiriva and d/c Trelegy. [place] contacted and advised to d/c new scripts fo Dulera and Spiriva because we are going to try for patient assistance.  Marland Kitchen Spoke with patient's wife and advised of changes. She was agreeable.  . Advised wife that she should be contacted by the pharmacy team within  the next 10 days regarding prescription assistance. She was appreciative of the help.   Patient Self Care Activities:  . Self administers medications as prescribed . Performs ADL's independently . Has help from his wife for medical management  Please see past updates related to this goal by clicking on the "Past Updates" button in the selected goal          Plan:  Patient will be contacted by [facility] pharmacy team regarding patient prescription assistance for [facility] and Spiriva Wife/patient will reach out to CCM team as needed RN CCM will f/u with patient over the next 30 days   [name], BSN, RN-BC [place] / [place]: [phone]

## 2019-06-12 NOTE — Patient Instructions (Signed)
RN Care Plan   . COPD Medication Management        Current Barriers:  . Film/video editor.  . Chronic Disease Management support and education needs related to COPD  Nurse Case Manager Clinical Goal(s):  Marland Kitchen Over the next 30 days, patient will work with Consulting civil engineer to obtain affordable COPD management medications  Interventions:  . Discussed current treatment plan . Collaborated with [name] clinical staff. No samples of Trelegy available. Nash Dimmer with [place] team. Pharmacist reviewed and pharmacy tech assisted with patient assistance information.   o Patient will need to spend $600 out-of-pocket before he will qualify for prescription assistance for Trelegy . Collaborated with Dr Livia Snellen to prescribe Ruthe Mannan and Spiriva and d/c Trelegy. [place] contacted and advised to d/c new scripts fo Dulera and Spiriva because we are going to try for patient assistance.  Marland Kitchen Spoke with patient's wife and advised of changes. She was agreeable.  . Advised wife that she should be contacted by the pharmacy team within the next 10 days regarding prescription assistance. She was appreciative of the help.   Patient Self Care Activities:  . Self administers medications as prescribed . Performs ADL's independently . Has help from his wife for medical management  Please see past updates related to this goal by clicking on the "Past Updates" button in the selected goal          Plan:  Patient will be contacted by [facility] pharmacy team regarding patient prescription assistance for [facility] and Spiriva Wife/patient will reach out to CCM team as needed RN CCM will f/u with patient over the next 30 days   [name], BSN, RN-BC [place] / [place]: [phone]  The patient verbalized understanding of instructions provided today and declined a print copy of patient instruction materials.

## 2019-06-12 NOTE — Patient Outreach (Signed)
[facility]) Care Management  06/12/2019  [name] 1951/03/07 [id number]                                        Medication Assistance Referral  Referral From: [place]  Medication/Company: Ruthe Mannan and Proventil / [employer] Patient application portion:  Education officer, museum portion: Interoffice Mailed to Dr. [name] Provider address/fax verified via: Office website  Medication/Company: Stann Ore Respimat / BI Patient application portion:  Mailed Provider application portion: Faxed  to Dr. [name] Provider address/fax verified via: Office website     Follow up:  Will follow up with patient in 5-15 business days to confirm application(s) have been received.  [name] P. Ajai Terhaar, [place] Management [phone]

## 2019-06-12 NOTE — Telephone Encounter (Signed)
Wasn't sure where to send them. If [facility] isn't okay, can you redirect

## 2019-06-12 NOTE — Patient Outreach (Signed)
[facility]) Care Management  06/12/2019  ESTEE ERTEL 20-Aug-1950 [id number]   Care coordination with [place], Jomarie Longs and patient's PCP to help with medication cost. Patient was on Trelegy but has not spent the required out-of-pocket.  Patient was switched to [facility] and Spiriva to take the place of Trelegy.    He will be sent patient assistance forms for Dulera and Proventil HFA from [employer] and Spiriva from [employer].  Plan: I will route patient assistance letter to [place] technician who will coordinate patient assistance program application process for medications listed above.  [facility] pharmacy technician will assist with obtaining all required documents from both patient and provider(s) and submit application(s) once completed.    Follow up in 4-6 weeks. Sharee Pimple [place] will follow for medication assistance.  [name], PharmD, [place] Clinical Pharmacist [phone]

## 2019-06-12 NOTE — Telephone Encounter (Signed)
I'll cancel them at [facility], but thank you for sending them. I just needed them documented so that we can start the patient assistance process.   Thanks Again! Cyril Mourning

## 2019-06-18 ENCOUNTER — Telehealth: Payer: Medicare Other

## 2019-06-20 ENCOUNTER — Other Ambulatory Visit: Payer: Self-pay | Admitting: Family Medicine

## 2019-06-21 ENCOUNTER — Other Ambulatory Visit: Payer: Self-pay | Admitting: Family Medicine

## 2019-06-24 ENCOUNTER — Other Ambulatory Visit: Payer: Self-pay | Admitting: Pharmacy Technician

## 2019-06-24 NOTE — Patient Outreach (Signed)
[facility]) Care Management  06/24/2019  MAXENCE LAPRAIRIE [birth year] [id number]     Successful call placed to patient regarding patient assistance application(s) for Ozempic, Novolin N and Novolin R with [employer], Spiriva Respimat with BI and  Dulera and Proventil HFA with [employer] , HIPAA identifiers confirmed and verified with patient's wife [name].  [name] confirmed they have received the applications but she has not mailed them back in yet. Informed her that if she had any questions to please call me back. Patient's wife informed she hopes to have the applications completed and mailed by the end of the week. Confirmed patient's wife had name and number and informed her to pay attention to the checklist on the letter from the pharmacist as well as the orange high lighted  sections on the application. Patient's wife verbalized understanding.    Follow up:  Will route note to [place] for case closure if document(s) have not been received in the next 15 business days.  Jeremiah Curci P. Apostolos Blagg, [place] Management [phone]

## 2019-06-25 ENCOUNTER — Encounter (HOSPITAL_COMMUNITY): Payer: Self-pay

## 2019-06-25 ENCOUNTER — Emergency Department (HOSPITAL_COMMUNITY): Payer: Medicare Other

## 2019-06-25 ENCOUNTER — Other Ambulatory Visit: Payer: Self-pay

## 2019-06-25 ENCOUNTER — Encounter: Payer: Self-pay | Admitting: Family Medicine

## 2019-06-25 ENCOUNTER — Ambulatory Visit (INDEPENDENT_AMBULATORY_CARE_PROVIDER_SITE_OTHER): Payer: Medicare Other | Admitting: Family Medicine

## 2019-06-25 ENCOUNTER — Inpatient Hospital Stay (HOSPITAL_COMMUNITY)
Admission: EM | Admit: 2019-06-25 | Discharge: 2019-06-28 | DRG: 291 | Disposition: A | Discharge status: Home Health | Payer: Medicare Other | Attending: Internal Medicine | Admitting: Internal Medicine

## 2019-06-25 DIAGNOSIS — R0902 Hypoxemia: Secondary | ICD-10-CM | POA: Diagnosis not present

## 2019-06-25 DIAGNOSIS — Z20822 Contact with and (suspected) exposure to covid-19: Secondary | ICD-10-CM | POA: Diagnosis present

## 2019-06-25 DIAGNOSIS — J449 Chronic obstructive pulmonary disease, unspecified: Secondary | ICD-10-CM | POA: Diagnosis present

## 2019-06-25 DIAGNOSIS — I509 Heart failure, unspecified: Secondary | ICD-10-CM | POA: Diagnosis not present

## 2019-06-25 DIAGNOSIS — R0603 Acute respiratory distress: Secondary | ICD-10-CM | POA: Diagnosis not present

## 2019-06-25 DIAGNOSIS — F411 Generalized anxiety disorder: Secondary | ICD-10-CM | POA: Diagnosis present

## 2019-06-25 DIAGNOSIS — Z6841 Body Mass Index (BMI) 40.0 and over, adult: Secondary | ICD-10-CM | POA: Diagnosis not present

## 2019-06-25 DIAGNOSIS — L03116 Cellulitis of left lower limb: Secondary | ICD-10-CM | POA: Diagnosis not present

## 2019-06-25 DIAGNOSIS — Z833 Family history of diabetes mellitus: Secondary | ICD-10-CM | POA: Diagnosis not present

## 2019-06-25 DIAGNOSIS — Z8042 Family history of malignant neoplasm of prostate: Secondary | ICD-10-CM

## 2019-06-25 DIAGNOSIS — Z825 Family history of asthma and other chronic lower respiratory diseases: Secondary | ICD-10-CM | POA: Diagnosis not present

## 2019-06-25 DIAGNOSIS — L03115 Cellulitis of right lower limb: Secondary | ICD-10-CM | POA: Diagnosis not present

## 2019-06-25 DIAGNOSIS — R531 Weakness: Secondary | ICD-10-CM | POA: Diagnosis not present

## 2019-06-25 DIAGNOSIS — I4891 Unspecified atrial fibrillation: Secondary | ICD-10-CM

## 2019-06-25 DIAGNOSIS — Z8572 Personal history of non-Hodgkin lymphomas: Secondary | ICD-10-CM | POA: Diagnosis not present

## 2019-06-25 DIAGNOSIS — E1165 Type 2 diabetes mellitus with hyperglycemia: Secondary | ICD-10-CM | POA: Diagnosis not present

## 2019-06-25 DIAGNOSIS — I5033 Acute on chronic diastolic (congestive) heart failure: Secondary | ICD-10-CM

## 2019-06-25 DIAGNOSIS — Z87891 Personal history of nicotine dependence: Secondary | ICD-10-CM | POA: Diagnosis not present

## 2019-06-25 DIAGNOSIS — Z7951 Long term (current) use of inhaled steroids: Secondary | ICD-10-CM | POA: Diagnosis not present

## 2019-06-25 DIAGNOSIS — I110 Hypertensive heart disease with heart failure: Principal | ICD-10-CM | POA: Diagnosis present

## 2019-06-25 DIAGNOSIS — R000 Tachycardia, unspecified: Secondary | ICD-10-CM | POA: Diagnosis not present

## 2019-06-25 DIAGNOSIS — E785 Hyperlipidemia, unspecified: Secondary | ICD-10-CM | POA: Diagnosis present

## 2019-06-25 DIAGNOSIS — N179 Acute kidney failure, unspecified: Secondary | ICD-10-CM | POA: Diagnosis not present

## 2019-06-25 DIAGNOSIS — I5031 Acute diastolic (congestive) heart failure: Secondary | ICD-10-CM | POA: Diagnosis not present

## 2019-06-25 DIAGNOSIS — R069 Unspecified abnormalities of breathing: Secondary | ICD-10-CM | POA: Diagnosis not present

## 2019-06-25 DIAGNOSIS — R0602 Shortness of breath: Secondary | ICD-10-CM | POA: Diagnosis not present

## 2019-06-25 DIAGNOSIS — B369 Superficial mycosis, unspecified: Secondary | ICD-10-CM | POA: Diagnosis not present

## 2019-06-25 DIAGNOSIS — Z7901 Long term (current) use of anticoagulants: Secondary | ICD-10-CM | POA: Diagnosis not present

## 2019-06-25 DIAGNOSIS — J9621 Acute and chronic respiratory failure with hypoxia: Secondary | ICD-10-CM

## 2019-06-25 DIAGNOSIS — R739 Hyperglycemia, unspecified: Secondary | ICD-10-CM | POA: Diagnosis not present

## 2019-06-25 DIAGNOSIS — Z8249 Family history of ischemic heart disease and other diseases of the circulatory system: Secondary | ICD-10-CM | POA: Diagnosis not present

## 2019-06-25 DIAGNOSIS — I482 Chronic atrial fibrillation, unspecified: Secondary | ICD-10-CM

## 2019-06-25 DIAGNOSIS — Z79899 Other long term (current) drug therapy: Secondary | ICD-10-CM

## 2019-06-25 DIAGNOSIS — J9620 Acute and chronic respiratory failure, unspecified whether with hypoxia or hypercapnia: Secondary | ICD-10-CM | POA: Diagnosis present

## 2019-06-25 DIAGNOSIS — Z800 Family history of malignant neoplasm of digestive organs: Secondary | ICD-10-CM

## 2019-06-25 DIAGNOSIS — Z794 Long term (current) use of insulin: Secondary | ICD-10-CM

## 2019-06-25 DIAGNOSIS — R0689 Other abnormalities of breathing: Secondary | ICD-10-CM | POA: Diagnosis not present

## 2019-06-25 LAB — COMPREHENSIVE METABOLIC PANEL
ALT: 16 U/L (ref 0–44)
AST: 15 U/L (ref 15–41)
Albumin: 3.2 g/dL — ABNORMAL LOW (ref 3.5–5.0)
Alkaline Phosphatase: 64 U/L (ref 38–126)
Anion gap: 10 (ref 5–15)
BUN: 20 mg/dL (ref 8–23)
CO2: 27 mmol/L (ref 22–32)
Calcium: 8.9 mg/dL (ref 8.9–10.3)
Chloride: 96 mmol/L — ABNORMAL LOW (ref 98–111)
Creatinine, Ser: 1.15 mg/dL (ref 0.61–1.24)
GFR calc Af Amer: >60 mL/min (ref >60)
GFR calc non Af Amer: >60 mL/min (ref >60)
Glucose, Bld: 406 mg/dL — ABNORMAL HIGH (ref 70–99)
Potassium: 4.4 mmol/L (ref 3.5–5.1)
Sodium: 133 mmol/L — ABNORMAL LOW (ref 135–145)
Total Bilirubin: 1.6 mg/dL — ABNORMAL HIGH (ref 0.3–1.2)
Total Protein: 7.3 g/dL (ref 6.5–8.1)

## 2019-06-25 LAB — URINALYSIS, ROUTINE W REFLEX MICROSCOPIC
Bacteria, UA: NONE SEEN
Bilirubin Urine: NEGATIVE
Glucose, UA: >=500 mg/dL — AB
Ketones, ur: 20 mg/dL — AB
Leukocytes,Ua: NEGATIVE
Nitrite: NEGATIVE
Protein, ur: NEGATIVE mg/dL
Specific Gravity, Urine: 1.026 (ref 1.005–1.030)
pH: 5 (ref 5.0–8.0)

## 2019-06-25 LAB — PROTIME-INR
INR: 1.1 (ref 0.8–1.2)
Prothrombin Time: 14.2 seconds (ref 11.4–15.2)

## 2019-06-25 LAB — LACTIC ACID, PLASMA
Lactic Acid, Venous: 1.5 mmol/L (ref 0.5–1.9)
Lactic Acid, Venous: 1.7 mmol/L (ref 0.5–1.9)

## 2019-06-25 LAB — CBC WITH DIFFERENTIAL/PLATELET
Abs Immature Granulocytes: 0.02 10*3/uL (ref 0.00–0.07)
Basophils Absolute: 0 10*3/uL (ref 0.0–0.1)
Basophils Relative: 0 %
Eosinophils Absolute: 0 10*3/uL (ref 0.0–0.5)
Eosinophils Relative: 0 %
HCT: 38.6 % — ABNORMAL LOW (ref 39.0–52.0)
Hemoglobin: 12.4 g/dL — ABNORMAL LOW (ref 13.0–17.0)
Immature Granulocytes: 0 %
Lymphocytes Relative: 10 %
Lymphs Abs: 1.1 10*3/uL (ref 0.7–4.0)
MCH: 30.2 pg (ref 26.0–34.0)
MCHC: 32.1 g/dL (ref 30.0–36.0)
MCV: 94.1 fL (ref 80.0–100.0)
Monocytes Absolute: 0.7 10*3/uL (ref 0.1–1.0)
Monocytes Relative: 7 %
Neutro Abs: 8.7 10*3/uL — ABNORMAL HIGH (ref 1.7–7.7)
Neutrophils Relative %: 83 %
Platelets: 238 10*3/uL (ref 150–400)
RBC: 4.1 MIL/uL — ABNORMAL LOW (ref 4.22–5.81)
RDW: 13.4 % (ref 11.5–15.5)
WBC: 10.5 10*3/uL (ref 4.0–10.5)
nRBC: 0 % (ref 0.0–0.2)

## 2019-06-25 LAB — RESPIRATORY PANEL BY RT PCR (FLU A&B, COVID)
Influenza A by PCR: NEGATIVE
Influenza B by PCR: NEGATIVE
SARS Coronavirus 2 by RT PCR: NEGATIVE

## 2019-06-25 LAB — MRSA PCR SCREENING: MRSA by PCR: NEGATIVE

## 2019-06-25 LAB — CBG MONITORING, ED: Glucose-Capillary: 416 mg/dL — ABNORMAL HIGH (ref 70–99)

## 2019-06-25 LAB — MAGNESIUM: Magnesium: 1.8 mg/dL (ref 1.7–2.4)

## 2019-06-25 LAB — TROPONIN I (HIGH SENSITIVITY)
Troponin I (High Sensitivity): 10 ng/L (ref <18)
Troponin I (High Sensitivity): 8 ng/L (ref <18)

## 2019-06-25 LAB — POC SARS CORONAVIRUS 2 AG -  ED: SARS Coronavirus 2 Ag: NEGATIVE

## 2019-06-25 LAB — GLUCOSE, CAPILLARY
Glucose-Capillary: 437 mg/dL — ABNORMAL HIGH (ref 70–99)
Glucose-Capillary: 438 mg/dL — ABNORMAL HIGH (ref 70–99)

## 2019-06-25 LAB — BRAIN NATRIURETIC PEPTIDE: B Natriuretic Peptide: 1520 pg/mL — ABNORMAL HIGH (ref 0.0–100.0)

## 2019-06-25 MED ORDER — ONDANSETRON HCL 4 MG/2ML IJ SOLN: 4.0000 mg | Freq: Four times a day (QID) | INTRAMUSCULAR | Status: DC | PRN

## 2019-06-25 MED ORDER — CHLORHEXIDINE GLUCONATE CLOTH 2 % EX PADS
6.0000 | MEDICATED_PAD | Freq: Every day | CUTANEOUS | Status: DC
  Administered 2019-06-26 – 2019-06-28 (×3): 6 via TOPICAL

## 2019-06-25 MED ORDER — INSULIN ASPART 100 UNIT/ML ~~LOC~~ SOLN
0.0000 [IU] | Freq: Three times a day (TID) | SUBCUTANEOUS | Status: DC
  Administered 2019-06-26: 15 [IU] via SUBCUTANEOUS
  Administered 2019-06-26: 8 [IU] via SUBCUTANEOUS
  Administered 2019-06-26: 15 [IU] via SUBCUTANEOUS
  Administered 2019-06-27: 2 [IU] via SUBCUTANEOUS
  Administered 2019-06-27: 11 [IU] via SUBCUTANEOUS
  Administered 2019-06-27: 8 [IU] via SUBCUTANEOUS
  Administered 2019-06-28: 5 [IU] via SUBCUTANEOUS
  Administered 2019-06-28: 15 [IU] via SUBCUTANEOUS

## 2019-06-25 MED ORDER — METOPROLOL TARTRATE 50 MG PO TABS
100.0000 mg | ORAL_TABLET | Freq: Two times a day (BID) | ORAL | Status: DC
  Administered 2019-06-25: 100 mg via ORAL
  Filled 2019-06-25: qty 2

## 2019-06-25 MED ORDER — PAROXETINE HCL 20 MG PO TABS
20.0000 mg | ORAL_TABLET | Freq: Every day | ORAL | Status: DC
  Administered 2019-06-26 – 2019-06-28 (×3): 20 mg via ORAL
  Filled 2019-06-25 (×5): qty 1

## 2019-06-25 MED ORDER — ORAL CARE MOUTH RINSE
15.0000 mL | Freq: Two times a day (BID) | OROMUCOSAL | Status: DC
  Administered 2019-06-25 – 2019-06-27 (×5): 15 mL via OROMUCOSAL

## 2019-06-25 MED ORDER — DILTIAZEM HCL-DEXTROSE 125-5 MG/125ML-% IV SOLN (PREMIX)
5.0000 mg/h | INTRAVENOUS | Status: DC
  Administered 2019-06-25: 5 mg/h via INTRAVENOUS
  Administered 2019-06-25: 15 mg/h via INTRAVENOUS
  Filled 2019-06-25 (×2): qty 125

## 2019-06-25 MED ORDER — SIMVASTATIN 20 MG PO TABS
40.0000 mg | ORAL_TABLET | Freq: Every day | ORAL | Status: DC
  Administered 2019-06-26 – 2019-06-27 (×2): 40 mg via ORAL
  Filled 2019-06-25 (×2): qty 2

## 2019-06-25 MED ORDER — INSULIN ASPART 100 UNIT/ML ~~LOC~~ SOLN
6.0000 [IU] | Freq: Once | SUBCUTANEOUS | Status: AC
  Administered 2019-06-25: 6 [IU] via SUBCUTANEOUS

## 2019-06-25 MED ORDER — CEFAZOLIN SODIUM-DEXTROSE 2-4 GM/100ML-% IV SOLN
2.0000 g | Freq: Three times a day (TID) | INTRAVENOUS | Status: DC
  Administered 2019-06-25 – 2019-06-28 (×9): 2 g via INTRAVENOUS
  Filled 2019-06-25 (×9): qty 100

## 2019-06-25 MED ORDER — ACETAMINOPHEN 325 MG PO TABS
650.0000 mg | ORAL_TABLET | ORAL | Status: DC | PRN
  Administered 2019-06-25 – 2019-06-26 (×2): 650 mg via ORAL
  Filled 2019-06-25 (×3): qty 2

## 2019-06-25 MED ORDER — INSULIN NPH (HUMAN) (ISOPHANE) 100 UNIT/ML ~~LOC~~ SUSP
55.0000 [IU] | Freq: Two times a day (BID) | SUBCUTANEOUS | Status: DC
  Administered 2019-06-25: 55 [IU] via SUBCUTANEOUS
  Filled 2019-06-25: qty 10

## 2019-06-25 MED ORDER — SODIUM CHLORIDE 0.9% FLUSH: 3.0000 mL | INTRAVENOUS | Status: DC | PRN

## 2019-06-25 MED ORDER — APIXABAN 5 MG PO TABS
5.0000 mg | ORAL_TABLET | Freq: Two times a day (BID) | ORAL | Status: DC
  Administered 2019-06-25 – 2019-06-28 (×6): 5 mg via ORAL
  Filled 2019-06-25 (×7): qty 1

## 2019-06-25 MED ORDER — ALPRAZOLAM 0.25 MG PO TABS
0.2500 mg | ORAL_TABLET | Freq: Three times a day (TID) | ORAL | Status: DC | PRN
  Administered 2019-06-25 – 2019-06-26 (×2): 0.25 mg via ORAL
  Filled 2019-06-25 (×4): qty 1

## 2019-06-25 MED ORDER — INSULIN ASPART 100 UNIT/ML ~~LOC~~ SOLN
0.0000 [IU] | Freq: Every day | SUBCUTANEOUS | Status: DC
  Administered 2019-06-25: 5 [IU] via SUBCUTANEOUS
  Administered 2019-06-27: 4 [IU] via SUBCUTANEOUS

## 2019-06-25 MED ORDER — IPRATROPIUM-ALBUTEROL 0.5-2.5 (3) MG/3ML IN SOLN
3.0000 mL | Freq: Four times a day (QID) | RESPIRATORY_TRACT | Status: DC
  Administered 2019-06-25 – 2019-06-28 (×12): 3 mL via RESPIRATORY_TRACT
  Filled 2019-06-25 (×11): qty 3
  Filled 2019-06-25: qty 6

## 2019-06-25 MED ORDER — FUROSEMIDE 10 MG/ML IJ SOLN
80.0000 mg | Freq: Two times a day (BID) | INTRAMUSCULAR | Status: DC
  Administered 2019-06-25: 80 mg via INTRAVENOUS

## 2019-06-25 MED ORDER — FUROSEMIDE 10 MG/ML IJ SOLN
80.0000 mg | Freq: Once | INTRAMUSCULAR | Status: AC
  Administered 2019-06-25: 80 mg via INTRAVENOUS
  Filled 2019-06-25: qty 8

## 2019-06-25 MED ORDER — SODIUM CHLORIDE 0.9% FLUSH
3.0000 mL | Freq: Two times a day (BID) | INTRAVENOUS | Status: DC
  Administered 2019-06-25 – 2019-06-28 (×5): 3 mL via INTRAVENOUS

## 2019-06-25 MED ORDER — INSULIN ASPART 100 UNIT/ML ~~LOC~~ SOLN
20.0000 [IU] | Freq: Once | SUBCUTANEOUS | Status: AC
  Administered 2019-06-25: 20 [IU] via SUBCUTANEOUS
  Filled 2019-06-25: qty 1

## 2019-06-25 MED ORDER — DILTIAZEM LOAD VIA INFUSION
10.0000 mg | Freq: Once | INTRAVENOUS | Status: AC
  Administered 2019-06-25: 10 mg via INTRAVENOUS
  Filled 2019-06-25: qty 10

## 2019-06-25 MED ORDER — BUDESONIDE 0.5 MG/2ML IN SUSP
0.5000 mg | Freq: Two times a day (BID) | RESPIRATORY_TRACT | Status: DC
  Administered 2019-06-25 – 2019-06-28 (×5): 0.5 mg via RESPIRATORY_TRACT
  Filled 2019-06-25 (×6): qty 2

## 2019-06-25 MED ORDER — SODIUM CHLORIDE 0.9 % IV SOLN
250.0000 mL | INTRAVENOUS | Status: DC | PRN
  Administered 2019-06-27: 250 mL via INTRAVENOUS

## 2019-06-25 NOTE — H&P (Signed)
History and Physical  Justin Ruiz WIO:[id number] DOB: [birth year] DOA: 06/25/2019   PCP: [name], MD   Patient coming from: Home  Chief Complaint: sob  HPI:  Justin Ruiz is a 69 y.o. male with medical history of diastolic CHF, diabetes mellitus type 2, MGUS, chronic respiratory failure on 3 L, hypertension, hyperlipidemia, non-Hodgkin's lymphoma presenting with 2-day history of shortness of breath.  The patient denied any fevers, chills, chest pain, nausea, vomiting, diarrhea, abdominal pain.  He complains of orthopnea type symptoms.  He has to sleep in a recliner for the past 2 months.  He has dyspnea on exertion.  He states that his lower extremity edema is about the same as usual.  He quit smoking 5 years ago after 40-pack-year history.  He has been drinking 5 x 20 ounce bottles of water on a daily basis, and complains of polydipsia.  His wife has been out of town for the past month visiting relatives in [place].  As result, the patient has been living alone with the help with his neighbor.  The patient endorsed compliance with all his medications, but he seems to have very poor insight regarding his medications when asked specifics about dosing and frequency.  Because of progressive shortness of breath, he activated EMS.  He was noted to have oxygen saturation on 80% on 3 L.  CBG was 498.  Heart rate was in the 170s. In the emergency department, the patient was tachycardic with heart rate in the 130s.  He had low-grade temperature of 99.0 F.  He was hemodynamically stable.  Oxygen saturation was 96% on 4 L.  Assessment/Plan: Acute on chronic respiratory failure with hypoxia -Secondary to CHF exacerbation in the setting of OSA/OHS -He is chronically on 3 L nasal cannula -Wean oxygen as tolerated for saturation greater 92% -Chest x-ray with pulmonary vascular congestion  Acute on chronic diastolic CHF -42/09/8339 echo EF 50%, no WMA, PASP 46, trivial TR, mild decreased RV  function -Start furosemide 80 mg IV twice daily -Daily weights -Accurate I's and O's -Repeat echocardiogram  Cellulitis left lower extremity -The patient has been scratching and picking at his legs -Start cefazolin   Atrial fibrillation with RVR -Start diltiazem drip -TSH -Continue metoprolol -Continue apixaban  Uncontrolled diabetes mellitus type 2 with hyperglycemia -Start home dose NPH -NovoLog sliding scale -02/20/2019 hemoglobin A1c 9.9 -Repeat hemoglobin A1c -Holding metformin  Essential hypertension -Continue metoprolol -Adding diltiazem for A. fib with RVR  COPD -Start duo nebs  -start Pulmicort  Morbid obesity -BMI 39.6 -lifestyle modification        Past Medical History:  Diagnosis Date  . Acute diastolic heart failure ([place]) 03/28/2016  . Acute encephalopathy 03/28/2016  . Acute encephalopathy 03/28/2016  . Acute on chronic respiratory failure with hypoxia ([place]) 12/04/2016  . Arthritis   . Asthma   . Atrial fibrillation ([place])    on AC  . Benign neoplasm of ascending colon 05/05/2014  . BPH (benign prostatic hypertrophy)   . CARDIOVASCULAR STUDIES, ABNORMAL 04/27/2009   Qualifier: Diagnosis of  By: Percival Spanish, MD, Farrel Gordon    . Chronic anticoagulation 08/20/2012  . Chronic respiratory failure with hypoxia ([place]) 11/10/2017  . Colon polyps   . COPD mixed type ([place]) 03/28/2016   PFT 06/17/14- severe obstructive airways disease with slight response to bronchodilator. FVC 2.03/42%, FEV1 1.02/28%, ratio 0.5, TLC 82%, DLCO 59%  . Diabetes mellitus   . Diabetic neuropathy, type II diabetes mellitus ([place]) 09/25/2014  .  Diabetic retinopathy ([place]) 05/28/2017  . 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 GERD (gastroesophageal reflux disease)   . HTN (hypertension)    x 3 years  . Hyperlipidemia   . Lung nodules 03/11/2013  . Mediastinal adenopathy 06/20/2013   CT  & PET 2/15   . Memory loss   . MGUS (monoclonal gammopathy of  unknown significance) 10/08/2015  . NHL (non-Hodgkin's lymphoma) ([place])    nhl dx 9/11  . Obesity    exogenous  . Obstructive sleep apnea 05/25/2014   NPSG-  04/07/2014-severe obstructive sleep apnea-AHI 75.1 per hour. CPAP titrated to 16. He required supplemental oxygen at 2 L/ [employer] which he already wears for sleep at home. Weight 338 pounds   . Peripheral vascular insufficiency ([place]) 08/02/2017  . Personal history of colonic polyps 03/02/2009   Qualifier: Diagnosis of  By: Deatra Ina MD, Sandy Salaam   . Sepsis ([place]) 06/07/2015  . Severe obesity (BMI >= 40) ([place]) 05/01/2013  . Shingles   . Sleep apnea    CPAP machine is broken  . Thrombocytopenia ([place]) 11/23/2016   Past Surgical History:  Procedure Laterality Date  . APPENDECTOMY    . COLONOSCOPY WITH PROPOFOL N/A 05/05/2014   Procedure: COLONOSCOPY WITH PROPOFOL;  Surgeon: Inda Castle, MD;  Location: [facility];  Service: Endoscopy;  Laterality: N/A;   Social History:  reports that he quit smoking about 8 months ago. His smoking use included cigarettes. He started smoking about 50 years ago. He has a 40.00 pack-year smoking history. He has never used smokeless tobacco. He reports that he does not drink alcohol or use drugs.   Family History  Problem Relation Age of Onset  . Liver cancer Mother   . Diabetes Mother   . Heart disease Mother   . Colon cancer Father   . Prostate cancer Father   . Colon polyps Father   . Pulmonary embolism Sister   . Diabetes Sister   . Heart attack Sister   . Aortic aneurysm Sister   . COPD Sister   . Heart disease Sister   . COPD Sister   . Heart disease Sister   . Diabetes Maternal Grandmother      Allergies  Allergen Reactions  . Avapro [Irbesartan] Other (See Comments)    "doesnt sit right with me"--light headed  . Lipitor [Atorvastatin Calcium] Other (See Comments)    Leg pain     Prior to Admission medications   Medication Sig Start Date End Date Taking? Authorizing Provider    Accu-Chek FastClix Lancets MISC Test BS QID Dx E11.9 01/28/19  Yes [name], MD  acetaminophen (TYLENOL) 325 MG tablet Take 650 mg by mouth every 6 (six) hours as needed.   Yes [provider]  albuterol (VENTOLIN HFA) 108 (90 Base) MCG/ACT inhaler Inhale 2 puffs into the lungs every 6 (six) hours as needed for wheezing or shortness of breath. 08/23/18  Yes Young, Tarri Fuller D, MD  Alcohol Swabs (B-D SINGLE USE SWABS REGULAR) PADS Test BS QID Dx E11.9 01/28/19  Yes Stacks, [name], MD  ALPRAZolam Duanne Moron) 0.25 MG tablet Take 1 tablet (0.25 mg total) by mouth 3 (three) times daily as needed. for anxiety 02/20/19  Yes Stacks, [name], MD  Blood Glucose Monitoring Suppl (ACCU-CHEK AVIVA PLUS) w/Device KIT Test BS QID DX E11.9 01/28/19  Yes [name], MD  Cholecalciferol (VITAMIN D3) 5000 UNITS TABS Take 1 tablet by mouth every morning.   Yes [provider]  ELIQUIS 5 MG TABS tablet Take 1 tablet by mouth twice daily 03/04/19  Yes Stacks, [name], MD  furosemide (LASIX) 40 MG tablet TAKE 1 TABLET BY MOUTH TWICE DAILY AS DIRECTED 05/21/19  Yes Stacks, [name], MD  glucose blood (ACCU-CHEK AVIVA PLUS) test strip Test BS QID Dx E11.9 01/28/19  Yes Stacks, [name], MD  insulin NPH Human (NOVOLIN N RELION) 100 UNIT/ML injection Inject 0.55 mLs (55 Units total) into the skin 2 (two) times daily before a meal. 03/26/19  Yes [name], MD  insulin regular (NOVOLIN R) 100 units/mL injection Inject 15 units before breakfast and supper 03/26/19  Yes Stacks, [name], MD  ipratropium-albuterol (DUONEB) 0.5-2.5 (3) MG/3ML SOLN Take 3 mLs by nebulization every 6 (six) hours as needed. 08/23/18 06/25/19 Yes Baird Lyons D, MD  Lancet Devices (LANCING DEVICE) MISC USE AS DIRECTED 11/13/12  Yes Chipper Herb, MD  metFORMIN (GLUCOPHAGE) 500 MG tablet TAKE 1 TABLET BY MOUTH IN THE MORNING AND 2 IN THE EVENING 12/26/18  Yes Stacks, [name], MD  metoprolol tartrate (LOPRESSOR) 100 MG tablet Take 1 tablet by  mouth twice daily 05/21/19  Yes Stacks, [name], MD  mometasone-formoterol (DULERA) 200-5 MCG/ACT AERO Inhale 2 puffs into the lungs 2 (two) times daily. 06/12/19  Yes Stacks, [name], MD  PARoxetine (PAXIL) 20 MG tablet Take 1 tablet by mouth once daily 05/21/19  Yes Stacks, [name], MD  simvastatin (ZOCOR) 40 MG tablet Take 1 tablet by mouth once daily 03/25/19  Yes Stacks, [name], MD  Tiotropium Bromide Monohydrate (SPIRIVA RESPIMAT) 2.5 MCG/ACT AERS Inhale 1 puff into the lungs daily. 06/12/19  Yes Stacks, [name], MD  Semaglutide, 1 MG/DOSE, (OZEMPIC, 1 MG/DOSE,) 2 MG/1.5ML SOPN Inject 1 mg into the skin once a week. Patient not taking: Reported on 06/25/2019 03/26/19   [name], MD    Review of Systems:  Constitutional:  No weight loss, night sweats, Fevers, chill  Head&Eyes: No headache.  No vision loss.  No eye pain or scotoma ENT:  No Difficulty swallowing,Tooth/dental problems,Sore throat,  No ear ache, post nasal drip,  Cardio-vascular:  No chest pain, dizziness, palpitations  GI:  No  abdominal pain, nausea, vomiting, diarrhea, loss of appetite, hematochezia, melena, heartburn, indigestion, Resp:   No cough. No coughing up of blood .No wheezing.No chest wall deformity  Skin:  no rash or lesions.  GU:  no dysuria, change in color of urine, no urgency or frequency. No flank pain.  Musculoskeletal:  No joint pain or swelling. No decreased range of motion. No back pain.  Psych:  No change in mood or affect. No depression or anxiety. Neurologic: No headache, no dysesthesia, no focal weakness, no vision loss. No syncope  Physical Exam: Vitals:   06/25/19 1130 06/25/19 1230 06/25/19 1305 06/25/19 1330  BP: (!) 144/88 130/78 133/79 131/87  Pulse: 89 (!) 109    Resp: (!) 25 (!) 27  (!) 23  Temp:      TempSrc:      SpO2: 98% 96% 96%   Weight:      Height:       General:  A&O x 3, NAD, nontoxic, pleasant/cooperative Head/Eye: No conjunctival hemorrhage, no icterus, [place]/AT,  No nystagmus ENT:  No icterus,  No thrush, good dentition, no pharyngeal exudate Neck:  No masses, no lymphadenpathy, no bruits CV:  iRRR, no rub, no gallop, no S3 Lung:  Diminished BS bilateral.  Bibasilar crackles Abdomen: soft/NT, +BS, nondistended, no peritoneal signs Ext: No cyanosis, No rashes, No petechiae, +  lymphangitis LLE, 2+ LE edema Neuro: CNII-XII intact, strength 4/5 in bilateral upper and lower extremities, no dysmetria  Labs on Admission:  Basic Metabolic Panel: Recent Labs  Lab 06/25/19 1120  NA 133*  K 4.4  CL 96*  CO2 27  GLUCOSE 406*  BUN 20  CREATININE 1.15  CALCIUM 8.9   Liver Function Tests: Recent Labs  Lab 06/25/19 1120  AST 15  ALT 16  ALKPHOS 64  BILITOT 1.6*  PROT 7.3  ALBUMIN 3.2*   No results for input(s): LIPASE, AMYLASE in the last 168 hours. No results for input(s): AMMONIA in the last 168 hours. CBC: Recent Labs  Lab 06/25/19 1120  WBC 10.5  NEUTROABS 8.7*  HGB 12.4*  HCT 38.6*  MCV 94.1  PLT 238   Coagulation Profile: Recent Labs  Lab 06/25/19 1120  INR 1.1   Cardiac Enzymes: No results for input(s): CKTOTAL, CKMB, CKMBINDEX, TROPONINI in the last 168 hours. BNP: Invalid input(s): POCBNP CBG: No results for input(s): GLUCAP in the last 168 hours. Urine analysis:    Component Value Date/Time   COLORURINE YELLOW 06/25/2019 1133   APPEARANCEUR CLEAR 06/25/2019 1133   APPEARANCEUR Clear 05/30/2018 1147   LABSPEC 1.026 06/25/2019 1133   PHURINE 5.0 06/25/2019 1133   GLUCOSEU >=500 (A) 06/25/2019 1133   HGBUR SMALL (A) 06/25/2019 1133   BILIRUBINUR NEGATIVE 06/25/2019 1133   BILIRUBINUR Negative 05/30/2018 1147   KETONESUR 20 (A) 06/25/2019 1133   PROTEINUR NEGATIVE 06/25/2019 1133   UROBILINOGEN negative 05/01/2013 1115   UROBILINOGEN 0.2 05/18/2010 0352   NITRITE NEGATIVE 06/25/2019 1133   LEUKOCYTESUR NEGATIVE 06/25/2019 1133   Sepsis Labs: '@[email or web address]' (procalcitonin:4,lacticidven:4) ) Recent Results (from  the past 240 hour(s))  Culture, blood (routine x 2)     Status: None (Preliminary result)   Collection Time: 06/25/19 11:20 AM   Specimen: BLOOD LEFT ARM  Result Value Ref Range Status   Specimen Description BLOOD LEFT ARM  Final   Special Requests   Final    BOTTLES DRAWN AEROBIC AND ANAEROBIC Blood Culture adequate volume Performed at [facility], [address], [place], [place] [address]    Culture PENDING  Incomplete   Report Status PENDING  Incomplete  Culture, blood (routine x 2)     Status: None (Preliminary result)   Collection Time: 06/25/19 11:59 AM   Specimen: BLOOD RIGHT ARM  Result Value Ref Range Status   Specimen Description BLOOD RIGHT ARM  Final   Special Requests   Final    BOTTLES DRAWN AEROBIC AND ANAEROBIC Blood Culture adequate volume Performed at [facility], [address] [place], [place] [address]    Culture PENDING  Incomplete   Report Status PENDING  Incomplete     Radiological Exams on Admission: DG Chest Port 1 View  Result Date: 06/25/2019 CLINICAL DATA:  Shortness of breath EXAM: PORTABLE CHEST 1 VIEW COMPARISON:  11/01/2018 FINDINGS: The heart size and mediastinal contours are stable. Calcific aortic knob. Mild pulmonary vascular congestion. Minimal linear scarring versus atelectasis in the left lung base. No focal airspace consolidation, pleural effusion, or pneumothorax. The visualized skeletal structures are unremarkable. IMPRESSION: Mild pulmonary vascular congestion. Electronically Signed   By: Davina Poke D.O.   On: 06/25/2019 11:53    EKG: Independently reviewed. afib Rvr with nonspecific STT changes    Time spent:60 minutes Code Status:   FULL Family Communication:  No Family at bedside Disposition Plan: expect 2-3 day hospitalization Consults called: none DVT Prophylaxis: apixaban  Orson Eva,  DO  [facility] Pager [phone]  If 7PM-7AM, please contact night-coverage [email or web address] Password [email or web address] 06/25/2019, 2:09  PM

## 2019-06-25 NOTE — ED Triage Notes (Signed)
Pt presents to ED via [facility] for increased SOB started yesterday. Pt with hx COPD, CHF. Pt chronically on 3L of O2. EMS called out and sats was initially 80% increased O2 to 4L, HR initially 170. CBG 498. No fever. Pt denies pain.

## 2019-06-25 NOTE — ED Provider Notes (Signed)
[facility] Provider Note   CSN: [id number] Arrival date & time: 06/25/19  1111     History Chief Complaint  Patient presents with  . Shortness of Breath    Justin Ruiz is a 69 y.o. male.  He has multiple medical problems including chronic respiratory failure on 3 L oxygen, CHF, COPD.  He said he started feeling increased shortness of breath yesterday and generally weak.  He called EMS today who initially found his sats at 80% on his 3 L and increased him to 4 L.  He was tachycardic to 170.  Elevated blood sugars were 498.  Patient denies any fever cough chest pain abdominal pain vomiting diarrhea or urinary symptoms.  States has been taking his medications regularly.  No sick contacts or recent travel.  The history is provided by the patient and the EMS personnel.  Shortness of Breath Severity:  Moderate Onset quality:  Gradual Duration:  2 days Timing:  Constant Progression:  Worsening Relieved by:  Nothing Worsened by:  Activity Ineffective treatments:  Oxygen Associated symptoms: no abdominal pain, no chest pain, no cough, no fever, no headaches, no hemoptysis, no neck pain, no rash, no sore throat, no sputum production and no vomiting   Risk factors: obesity        Past Medical History:  Diagnosis Date  . Acute diastolic heart failure ([place]) 03/28/2016  . Acute encephalopathy 03/28/2016  . Acute encephalopathy 03/28/2016  . Acute on chronic respiratory failure with hypoxia ([place]) 12/04/2016  . Arthritis   . Asthma   . Atrial fibrillation ([place])    on AC  . Benign neoplasm of ascending colon 05/05/2014  . BPH (benign prostatic hypertrophy)   . CARDIOVASCULAR STUDIES, ABNORMAL 04/27/2009   Qualifier: Diagnosis of  By: Percival Spanish, MD, Farrel Gordon    . Chronic anticoagulation 08/20/2012  . Chronic respiratory failure with hypoxia ([place]) 11/10/2017  . Colon polyps   . COPD mixed type ([place]) 03/28/2016   PFT 06/17/14- severe obstructive airways disease with  slight response to bronchodilator. FVC 2.03/42%, FEV1 1.02/28%, ratio 0.5, TLC 82%, DLCO 59%  . Diabetes mellitus   . Diabetic neuropathy, type II diabetes mellitus ([place]) 09/25/2014  . Diabetic retinopathy ([place]) 05/28/2017  . 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 GERD (gastroesophageal reflux disease)   . HTN (hypertension)    x 3 years  . Hyperlipidemia   . Lung nodules 03/11/2013  . Mediastinal adenopathy 06/20/2013   CT  & PET 2/15   . Memory loss   . 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 NHL (non-Hodgkin's lymphoma) ([place])    nhl dx 9/11  . Obesity    exogenous  . Obstructive sleep apnea 05/25/2014   NPSG-  04/07/2014-severe obstructive sleep apnea-AHI 75.1 per hour. CPAP titrated to 16. He required supplemental oxygen at 2 L/ [employer] which he already wears for sleep at home. Weight 338 pounds   . Peripheral vascular insufficiency ([place]) 08/02/2017  . Personal history of colonic polyps 03/02/2009   Qualifier: Diagnosis of  By: Deatra Ina MD, Sandy Salaam   . Sepsis ([place]) 06/07/2015  . Severe obesity (BMI >= 40) ([place]) 05/01/2013  . Shingles   . Sleep apnea    CPAP machine is broken  . Thrombocytopenia ([place]) 11/23/2016    Patient Active Problem List   Diagnosis Date Noted  . Diabetic infection of right foot ([place]) 12/17/2018  . Chronic respiratory failure with hypoxia ([place]) 11/10/2017  .  Peripheral vascular insufficiency ([place]) 08/02/2017  . Diabetic retinopathy ([place]) 05/28/2017  . Thrombocytopenia ([place]) 11/23/2016  . Memory loss 04/13/2016  . Hypomagnesemia   . Acute diastolic heart failure ([place]) 03/28/2016  . COPD mixed type ([place]) 03/28/2016  . 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 Chronic diastolic heart failure ([place])   . Diabetic neuropathy, type II diabetes mellitus ([place]) 09/25/2014  . Obstructive sleep apnea 05/25/2014  . Benign neoplasm of ascending colon 05/05/2014  . Excessive daytime  sleepiness 01/24/2014  . Mediastinal adenopathy 06/20/2013  . Generalized anxiety disorder 05/01/2013  . Lung nodules 03/11/2013  . NHL (non-Hodgkin's lymphoma) ([place]) 01/26/2010  . Obesity, morbid ([place]) 04/27/2009  . ABNORMAL STRESS ELECTROCARDIOGRAM 03/25/2009  . Diabetes mellitus type 2, insulin dependent ([place]) 04/29/2007  . Hyperlipidemia 04/29/2007  . Hypertension 04/29/2007  . Seasonal and perennial allergic rhinitis 04/29/2007  . G E R D 04/29/2007  . BENIGN PROSTATIC HYPERTROPHY, HX OF 04/29/2007    Past Surgical History:  Procedure Laterality Date  . APPENDECTOMY    . COLONOSCOPY WITH PROPOFOL N/A 05/05/2014   Procedure: COLONOSCOPY WITH PROPOFOL;  Surgeon: Inda Castle, MD;  Location: [facility];  Service: Endoscopy;  Laterality: N/A;       Family History  Problem Relation Age of Onset  . Liver cancer Mother   . Diabetes Mother   . Heart disease Mother   . Colon cancer Father   . Prostate cancer Father   . Colon polyps Father   . Pulmonary embolism Sister   . Diabetes Sister   . Heart attack Sister   . Aortic aneurysm Sister   . COPD Sister   . Heart disease Sister   . COPD Sister   . Heart disease Sister   . Diabetes Maternal Grandmother     Social History   Tobacco Use  . Smoking status: Former Smoker    Packs/day: 1.00    Years: 40.00    Pack years: 40.00    Types: Cigarettes    Start date: 12/17/1968    Quit date: 10/18/2018    Years since quitting: 0.6  . Smokeless tobacco: Never Used  . Tobacco comment: 2 ppd; pt reports smoking 2-3 weeks ago, 5-6 cigs/daily  Substance Use Topics  . Alcohol use: No    Alcohol/week: 0.0 standard drinks    Comment: previous  . Drug use: No    Home Medications Prior to Admission medications   Medication Sig Start Date End Date Taking? Authorizing Provider  Accu-Chek FastClix Lancets MISC Test BS QID Dx E11.9 01/28/19   [name], MD  acetaminophen (TYLENOL) 325 MG tablet Take 650 mg by mouth every 6  (six) hours as needed.    [provider]  albuterol (VENTOLIN HFA) 108 (90 Base) MCG/ACT inhaler Inhale 2 puffs into the lungs every 6 (six) hours as needed for wheezing or shortness of breath. 08/23/18   Baird Lyons D, MD  Alcohol Swabs (B-D SINGLE USE SWABS REGULAR) PADS Test BS QID Dx E11.9 01/28/19   [name], MD  ALPRAZolam Duanne Moron) 0.25 MG tablet Take 1 tablet (0.25 mg total) by mouth 3 (three) times daily as needed. for anxiety 02/20/19   [name], MD  Blood Glucose Monitoring Suppl (ACCU-CHEK AVIVA PLUS) w/Device KIT Test BS QID DX E11.9 01/28/19   [name], MD  Cholecalciferol (VITAMIN D3) 5000 UNITS TABS Take 1 tablet by mouth every morning.    [provider]  ELIQUIS 5 MG TABS tablet Take 1  tablet by mouth twice daily 03/04/19   [name], MD  furosemide (LASIX) 40 MG tablet TAKE 1 TABLET BY MOUTH TWICE DAILY AS DIRECTED 05/21/19   [name], MD  glucose blood (ACCU-CHEK AVIVA PLUS) test strip Test BS QID Dx E11.9 01/28/19   [name], MD  insulin NPH Human (NOVOLIN N RELION) 100 UNIT/ML injection Inject 0.55 mLs (55 Units total) into the skin 2 (two) times daily before a meal. 03/26/19   [name], MD  insulin regular (NOVOLIN R) 100 units/mL injection Inject 15 units before breakfast and supper 03/26/19   [name], MD  ipratropium-albuterol (DUONEB) 0.5-2.5 (3) MG/3ML SOLN Take 3 mLs by nebulization every 6 (six) hours as needed. 08/23/18 02/27/19  Deneise Lever, MD  Lancet Devices (LANCING DEVICE) MISC USE AS DIRECTED 11/13/12   Chipper Herb, MD  metFORMIN (GLUCOPHAGE) 500 MG tablet TAKE 1 TABLET BY MOUTH IN THE MORNING AND 2 IN THE EVENING 12/26/18   [name], MD  metoprolol tartrate (LOPRESSOR) 100 MG tablet Take 1 tablet by mouth twice daily 05/21/19   [name], MD  mometasone-formoterol (DULERA) 200-5 MCG/ACT AERO Inhale 2 puffs into the lungs 2 (two) times daily. 06/12/19   [name], MD  PARoxetine (PAXIL)  20 MG tablet Take 1 tablet by mouth once daily 05/21/19   [name], MD  Semaglutide, 1 MG/DOSE, (OZEMPIC, 1 MG/DOSE,) 2 MG/1.5ML SOPN Inject 1 mg into the skin once a week. 03/26/19   [name], MD  simvastatin (ZOCOR) 40 MG tablet Take 1 tablet by mouth once daily 03/25/19   [name], MD  Tiotropium Bromide Monohydrate (SPIRIVA RESPIMAT) 2.5 MCG/ACT AERS Inhale 1 puff into the lungs daily. 06/12/19   [name], MD    Allergies    Avapro [irbesartan] and Lipitor [atorvastatin calcium]  Review of Systems   Review of Systems  Constitutional: Negative for fever.  HENT: Negative for sore throat.   Eyes: Negative for visual disturbance.  Respiratory: Positive for shortness of breath. Negative for cough, hemoptysis and sputum production.   Cardiovascular: Positive for leg swelling. Negative for chest pain.  Gastrointestinal: Negative for abdominal pain and vomiting.  Genitourinary: Negative for dysuria.  Musculoskeletal: Negative for neck pain.  Skin: Negative for rash.  Neurological: Negative for headaches.    Physical Exam Updated Vital Signs BP (!) 145/80 (BP Location: Right Arm)   Pulse (!) 136   Temp 99 F (37.2 C) (Oral)   Resp (!) 24   Ht '5\' 11"'$  (1.803 m)   Wt 128.8 kg   SpO2 99%   BMI 39.61 kg/m   Physical Exam Vitals and nursing note reviewed.  Constitutional:      Appearance: He is well-developed.  HENT:     Head: Normocephalic and atraumatic.  Eyes:     Conjunctiva/sclera: Conjunctivae normal.  Cardiovascular:     Rate and Rhythm: Tachycardia present. Rhythm irregular.     Heart sounds: No murmur.  Pulmonary:     Effort: Pulmonary effort is normal. Tachypnea present. No respiratory distress.     Breath sounds: Normal breath sounds.  Abdominal:     Palpations: Abdomen is soft.     Tenderness: There is no abdominal tenderness.  Musculoskeletal:     Cervical back: Neck supple.     Right lower leg: No tenderness. Edema present.     Left  lower leg: No tenderness. Edema present.  Skin:    General: Skin is warm and dry.     Capillary  Refill: Capillary refill takes less than 2 seconds.  Neurological:     General: No focal deficit present.     Mental Status: He is alert.     ED Results / Procedures / Treatments   Labs (all labs ordered are listed, but only abnormal results are displayed) Labs Reviewed  CBC WITH DIFFERENTIAL/PLATELET - Abnormal; Notable for the following components:      Result Value   RBC 4.10 (*)    Hemoglobin 12.4 (*)    HCT 38.6 (*)    Neutro Abs 8.7 (*)    All other components within normal limits  COMPREHENSIVE METABOLIC PANEL - Abnormal; Notable for the following components:   Sodium 133 (*)    Chloride 96 (*)    Glucose, Bld 406 (*)    Albumin 3.2 (*)    Total Bilirubin 1.6 (*)    All other components within normal limits  BRAIN NATRIURETIC PEPTIDE - Abnormal; Notable for the following components:   B Natriuretic Peptide 1,520.0 (*)    All other components within normal limits  URINALYSIS, ROUTINE W REFLEX MICROSCOPIC - Abnormal; Notable for the following components:   Glucose, UA >=500 (*)    Hgb urine dipstick SMALL (*)    Ketones, ur 20 (*)    All other components within normal limits  CBG MONITORING, ED - Abnormal; Notable for the following components:   Glucose-Capillary 416 (*)    All other components within normal limits  CULTURE, BLOOD (ROUTINE X 2)  CULTURE, BLOOD (ROUTINE X 2)  RESPIRATORY PANEL BY RT PCR (FLU A&B, COVID)  LACTIC ACID, PLASMA  LACTIC ACID, PLASMA  PROTIME-INR  MAGNESIUM  POC SARS CORONAVIRUS 2 AG -  ED  TROPONIN I (HIGH SENSITIVITY)  TROPONIN I (HIGH SENSITIVITY)    EKG EKG Interpretation  Date/Time:  Tuesday June 25 2019 11:15:44 EST Ventricular Rate:  130 PR Interval:    QRS Duration: 82 QT Interval:  331 QTC Calculation: 460 R Axis:   80 Text Interpretation: Atrial fibrillation Ventricular premature complex rate fastler than prior 11/19  Confirmed by Aletta Edouard [phone]) on 06/25/2019 11:20:16 AM   Radiology DG Chest Port 1 View  Result Date: 06/25/2019 CLINICAL DATA:  Shortness of breath EXAM: PORTABLE CHEST 1 VIEW COMPARISON:  11/01/2018 FINDINGS: The heart size and mediastinal contours are stable. Calcific aortic knob. Mild pulmonary vascular congestion. Minimal linear scarring versus atelectasis in the left lung base. No focal airspace consolidation, pleural effusion, or pneumothorax. The visualized skeletal structures are unremarkable. IMPRESSION: Mild pulmonary vascular congestion. Electronically Signed   By: Davina Poke D.O.   On: 06/25/2019 11:53    Procedures .Critical Care Performed by: Hayden Rasmussen, MD Authorized by: Hayden Rasmussen, MD   Critical care provider statement:    Critical care time (minutes):  45   Critical care time was exclusive of:  Separately billable procedures and treating other patients   Critical care was necessary to treat or prevent imminent or life-threatening deterioration of the following conditions:  Cardiac failure and respiratory failure   Critical care was time spent personally by me on the following activities:  Discussions with consultants, evaluation of patient's response to treatment, examination of patient, ordering and performing treatments and interventions, ordering and review of laboratory studies, ordering and review of radiographic studies, pulse oximetry, re-evaluation of patient's condition, obtaining history from patient or surrogate, review of old charts and development of treatment plan with patient or surrogate   (including critical care time)  Medications  Ordered in ED Medications  diltiazem (CARDIZEM) 1 mg/mL load via infusion 10 mg (10 mg Intravenous Bolus from Bag 06/25/19 1417)    And  diltiazem (CARDIZEM) 125 mg in dextrose 5% 125 mL (1 mg/mL) infusion (15 mg/hr Intravenous Rate/Dose Change 06/25/19 1541)  furosemide (LASIX) injection 80 mg (80 mg  Intravenous Given 06/25/19 1257)  insulin aspart (novoLOG) injection 20 Units (20 Units Subcutaneous Given 06/25/19 1556)    ED Course  I have reviewed the triage vital signs and the nursing notes.  Pertinent labs & imaging results that were available during my care of the patient were reviewed by me and considered in my medical decision making (see chart for details).  Clinical Course as of Jun 25 1342  Tue Jun 25, 2019  1131 Differential includes CHF, COPD, ACS, metabolic derangement, anemia, Covid   [MB]  1233 Initial troponin X.  Lactate okay at 1.5.  Normal white count.  Hemoglobin slightly lower than baseline.  Glucose markedly elevated at 406.   [MB]  1245 BNP markedly elevated at 1500.  He is on 40 of Lasix twice daily so ordered an 80 IV.   [MB]  4098 Will need admission for continued management of congestive heart failure in the setting of rapid A. fib.  Will review with cardiology for recommendations.   [MB]  1191 Discussed with Dr. Theda Sers from [facility] hospitalist who will evaluate the patient for admission.   [MB]    Clinical Course User Index [MB] Hayden Rasmussen, MD   MDM Rules/Calculators/A&P                     LEANARD DIMAIO was evaluated in Emergency Department on 06/25/2019 for the symptoms described in the history of present illness. He was evaluated in the context of the global COVID-19 pandemic, which necessitated consideration that the patient might be at risk for infection with the SARS-CoV-2 virus that causes COVID-19. Institutional protocols and algorithms that pertain to the evaluation of patients at risk for COVID-19 are in a state of rapid change based on information released by regulatory bodies including the CDC and federal and state organizations. These policies and algorithms were followed during the patient's care in the ED.  CHA2DS2/VAS Stroke Risk Points  Current as of 10 minutes ago     4 >= 2 Points: High Risk  1 - 1.99 Points: Medium Risk  0 Points: Low  Risk    This is the only CHA2DS2/VAS Stroke Risk Points available for the past  year.: Last Change: N/A     Details    This score determines the patient's risk of having a stroke if the  patient has atrial fibrillation.       Points Metrics  1 Has Congestive Heart Failure:  Yes    Current as of 10 minutes ago  0 Has Vascular Disease:  No    Current as of 10 minutes ago  1 Has Hypertension:  Yes    Current as of 10 minutes ago  1 Age:  40    Current as of 10 minutes ago  1 Has Diabetes:  Yes    Current as of 10 minutes ago  0 Had Stroke:  No  Had TIA:  No  Had thromboembolism:  No    Current as of 10 minutes ago  0 Male:  No    Current as of 10 minutes ago           Final Clinical Impression(s) /  ED Diagnoses Final diagnoses:  Atrial fibrillation with rapid ventricular response (HCC)  Acute on chronic congestive heart failure, unspecified heart failure type ([place])  General weakness  Hyperglycemia    Rx / DC Orders ED Discharge Orders    None       Hayden Rasmussen, MD 06/25/19 1705

## 2019-06-25 NOTE — Progress Notes (Signed)
Subjective:    Patient ID: Justin Ruiz, male    DOB: 20-Feb-1951, 69 y.o.   MRN: [id number]   HPI: Justin Ruiz is a 69 y.o. male presenting for presents forFollow-up of diabetes. Patient checks blood sugar at home.  73, 64, fasting 431, 377 postprandial this AM   Can hardly breathe. Gasping for breath. For a day or two. Not sure which inhalers he now has. Due to lack of insurance coverage pt. Was recently swirched from [place] to [place].  Depression screen [facility] 2/9 03/07/2019 03/04/2019 02/20/2019 01/21/2019 12/17/2018  Decreased Interest 1 1 0 0 0  Down, Depressed, Hopeless 1 1 0 0 0  PHQ - 2 Score 2 2 0 0 0  Altered sleeping 1 1 - - -  Tired, decreased energy 0 1 - - -  Change in appetite 0 0 - - -  Feeling bad or failure about yourself  1 0 - - -  Trouble concentrating 1 1 - - -  Moving slowly or fidgety/restless 0 1 - - -  Suicidal thoughts 3 0 - - -  PHQ-9 Score - 6 - - -  Difficult doing work/chores Somewhat difficult Somewhat difficult - - -  Some recent data might be hidden     Relevant past medical, surgical, family and social history reviewed and updated as indicated.  Interim medical history since our last visit reviewed. Allergies and medications reviewed and updated.  ROS:  Review of Systems  Constitutional: Negative for fever.  Respiratory: Positive for chest tightness and shortness of breath.   Cardiovascular: Negative for chest pain.     Social History   Tobacco Use  Smoking Status Former Smoker  . Packs/day: 1.00  . Years: 40.00  . Pack years: 40.00  . Types: Cigarettes  . Start date: 12/17/1968  . Quit date: 10/18/2018  . Years since quitting: 0.6  Smokeless Tobacco Never Used  Tobacco Comment   2 ppd; pt reports smoking 2-3 weeks ago, 5-6 cigs/daily       Objective:     Wt Readings from Last 3 Encounters:  06/25/19 284 lb (128.8 kg)  02/27/19 294 lb (133.4 kg)  02/20/19 289 lb (131.1 kg)     Exam deferred. Pt.  [place] due to COVID 19. Phone visit performed.   Assessment & Plan:   1. Acute respiratory distress     Justin Ruiz was able to speak to me over the phone but he sounded anxious and concerned.  He was not gasping audibly into the phone.  He could speak with complete sentences but had to breathe between each sentence.  As result of his repeated concern I asked him if he could get a ride to the hospital from his wife.  He said that she is in [place].  At that point I broke off the office visit and told him that he needed to call 911 immediately.  He said that he was able to do that and he was in agreement and would do so immediately. I offered to call for him. He said that it would not be necessary.  I tried to reach her and her line was not accessible and that the voicemail was full and no one answered.  I followed his records through the emergency room until he was admitted to [facility] services this afternoon.  Admitting diagnosis was acute on chronic respiratory failure with hypoxia, acute on chronic diastolic CHF, cellulitis left lower extremity, atrial fibrillation  with rapid ventricular response, uncontrolled diabetes mellitus and COPD.   Virtual Visit via telephone Note  I discussed the limitations, risks, security and privacy concerns of performing an evaluation and management service by telephone and the availability of in person appointments. The patient was identified with two identifiers. Pt.expressed understanding and agreed to proceed. Pt. Is at home. Justin Ruiz is in his office.  Follow Up Instructions:   I discussed the assessment and treatment plan with the patient. The patient was provided an opportunity to ask questions and all were answered. The patient agreed with the plan and demonstrated an understanding of the instructions.   The patient was advised to call back or seek an in-person evaluation if the symptoms worsen or if the condition fails to improve as  anticipated.   Total minutes including chart review and phone contact time: 21   Follow up plan: Return if symptoms worsen or fail to improve.  Justin Fraise, MD [place]

## 2019-06-26 ENCOUNTER — Ambulatory Visit: Payer: Medicare Other | Admitting: Internal Medicine

## 2019-06-26 ENCOUNTER — Inpatient Hospital Stay (HOSPITAL_COMMUNITY): Payer: Medicare Other

## 2019-06-26 ENCOUNTER — Other Ambulatory Visit: Payer: Medicare Other

## 2019-06-26 DIAGNOSIS — I4891 Unspecified atrial fibrillation: Secondary | ICD-10-CM

## 2019-06-26 DIAGNOSIS — N179 Acute kidney failure, unspecified: Secondary | ICD-10-CM

## 2019-06-26 DIAGNOSIS — B369 Superficial mycosis, unspecified: Secondary | ICD-10-CM

## 2019-06-26 DIAGNOSIS — I5031 Acute diastolic (congestive) heart failure: Secondary | ICD-10-CM

## 2019-06-26 LAB — HEMOGLOBIN A1C
Hgb A1c MFr Bld: 9.1 % — ABNORMAL HIGH (ref 4.8–5.6)
Mean Plasma Glucose: 214.47 mg/dL

## 2019-06-26 LAB — CBC
HCT: 38.5 % — ABNORMAL LOW (ref 39.0–52.0)
Hemoglobin: 12.6 g/dL — ABNORMAL LOW (ref 13.0–17.0)
MCH: 30.4 pg (ref 26.0–34.0)
MCHC: 32.7 g/dL (ref 30.0–36.0)
MCV: 93 fL (ref 80.0–100.0)
Platelets: 239 10*3/uL (ref 150–400)
RBC: 4.14 MIL/uL — ABNORMAL LOW (ref 4.22–5.81)
RDW: 13.4 % (ref 11.5–15.5)
WBC: 9.4 10*3/uL (ref 4.0–10.5)
nRBC: 0 % (ref 0.0–0.2)

## 2019-06-26 LAB — BASIC METABOLIC PANEL
Anion gap: 11 (ref 5–15)
BUN: 29 mg/dL — ABNORMAL HIGH (ref 8–23)
CO2: 26 mmol/L (ref 22–32)
Calcium: 8.7 mg/dL — ABNORMAL LOW (ref 8.9–10.3)
Chloride: 89 mmol/L — ABNORMAL LOW (ref 98–111)
Creatinine, Ser: 1.97 mg/dL — ABNORMAL HIGH (ref 0.61–1.24)
GFR calc Af Amer: 39 mL/min — ABNORMAL LOW (ref >60)
GFR calc non Af Amer: 34 mL/min — ABNORMAL LOW (ref >60)
Glucose, Bld: 459 mg/dL — ABNORMAL HIGH (ref 70–99)
Potassium: 4.4 mmol/L (ref 3.5–5.1)
Sodium: 126 mmol/L — ABNORMAL LOW (ref 135–145)

## 2019-06-26 LAB — HIV ANTIBODY (ROUTINE TESTING W REFLEX): HIV Screen 4th Generation wRfx: NONREACTIVE

## 2019-06-26 LAB — GLUCOSE, CAPILLARY
Glucose-Capillary: 433 mg/dL — ABNORMAL HIGH (ref 70–99)
Glucose-Capillary: 441 mg/dL — ABNORMAL HIGH (ref 70–99)
Glucose-Capillary: 286 mg/dL — ABNORMAL HIGH (ref 70–99)
Glucose-Capillary: 185 mg/dL — ABNORMAL HIGH (ref 70–99)

## 2019-06-26 LAB — ECHOCARDIOGRAM COMPLETE
Height: 69 in
Weight: 4726.66 oz

## 2019-06-26 MED ORDER — INSULIN NPH (HUMAN) (ISOPHANE) 100 UNIT/ML ~~LOC~~ SUSP
60.0000 [IU] | Freq: Two times a day (BID) | SUBCUTANEOUS | Status: DC
  Administered 2019-06-26 – 2019-06-28 (×4): 60 [IU] via SUBCUTANEOUS
  Filled 2019-06-26 (×2): qty 10

## 2019-06-26 MED ORDER — INSULIN ASPART 100 UNIT/ML ~~LOC~~ SOLN
10.0000 [IU] | Freq: Three times a day (TID) | SUBCUTANEOUS | Status: DC
  Administered 2019-06-26 – 2019-06-27 (×3): 10 [IU] via SUBCUTANEOUS

## 2019-06-26 MED ORDER — ALPRAZOLAM 0.25 MG PO TABS
0.2500 mg | ORAL_TABLET | ORAL | Status: AC
  Administered 2019-06-26: 0.25 mg via ORAL
  Filled 2019-06-26: qty 1

## 2019-06-26 MED ORDER — NYSTATIN 100000 UNIT/GM EX POWD
Freq: Two times a day (BID) | CUTANEOUS | Status: DC
  Filled 2019-06-26 (×2): qty 15

## 2019-06-26 MED ORDER — FUROSEMIDE 10 MG/ML IJ SOLN
40.0000 mg | Freq: Two times a day (BID) | INTRAMUSCULAR | Status: DC
  Administered 2019-06-26 – 2019-06-28 (×5): 40 mg via INTRAVENOUS
  Filled 2019-06-26 (×5): qty 4

## 2019-06-26 MED ORDER — INSULIN ASPART 100 UNIT/ML ~~LOC~~ SOLN
20.0000 [IU] | Freq: Once | SUBCUTANEOUS | Status: AC
  Administered 2019-06-26: 20 [IU] via SUBCUTANEOUS

## 2019-06-26 MED ORDER — ALUM & MAG HYDROXIDE-SIMETH 200-200-20 MG/5ML PO SUSP: 30.0000 mL | ORAL | Status: DC | PRN

## 2019-06-26 MED ORDER — METOPROLOL TARTRATE 50 MG PO TABS
125.0000 mg | ORAL_TABLET | Freq: Two times a day (BID) | ORAL | Status: DC
  Administered 2019-06-26 (×2): 125 mg via ORAL
  Filled 2019-06-26: qty 1
  Filled 2019-06-26: qty 3
  Filled 2019-06-26: qty 1
  Filled 2019-06-26: qty 3

## 2019-06-26 MED ORDER — CYCLOBENZAPRINE HCL 10 MG PO TABS
5.0000 mg | ORAL_TABLET | Freq: Once | ORAL | Status: AC
  Administered 2019-06-26: 5 mg via ORAL
  Filled 2019-06-26: qty 1

## 2019-06-26 MED ORDER — PERFLUTREN LIPID MICROSPHERE
1.0000 mL | INTRAVENOUS | Status: AC | PRN
  Administered 2019-06-26: 1 mL via INTRAVENOUS
  Filled 2019-06-26: qty 10

## 2019-06-26 MED ORDER — LIVING WELL WITH DIABETES BOOK: Freq: Once | Status: AC

## 2019-06-26 MED ORDER — GUAIFENESIN-DM 100-10 MG/5ML PO SYRP: 15.0000 mL | ORAL_SOLUTION | ORAL | Status: DC | PRN

## 2019-06-26 NOTE — Progress Notes (Signed)
Inpatient Diabetes Program Recommendations  AACE/ADA: New Consensus Statement on Inpatient Glycemic Control (2015)  Target Ranges:  Prepandial:   less than 140 mg/dL      Peak postprandial:   less than 180 mg/dL (1-2 hours)      Critically ill patients:  140 - 180 mg/dL   Lab Results  Component Value Date   GLUCAP 433 (H) 06/26/2019   HGBA1C 9.1 (H) 06/25/2019    Review of Glycemic Control Results for Justin Ruiz, Justin Ruiz (MRN [id number]) as of 06/26/2019 09:38  Ref. Range 06/25/2019 15:25 06/25/2019 17:21 06/25/2019 20:20 06/26/2019 07:45  Glucose-Capillary Latest Ref Range: 70 - 99 mg/dL 416 (H) 437 (H) 438 (H) 433 (H)   Diabetes history: DM2 Outpatient Diabetes medications: Novolin N 55 units bid + Novolog R 15 units bid Current orders for Inpatient glycemic control: NPH insulin 60 units + Novolog moderate correction tid + hs 0-5 units  Inpatient Diabetes Program Recommendations:   -Add Novolog 10 units tid meal coverage if eats 50%  Ordered Living Well With Diabetes book.  Thank you, Nani Gasser. Mahir Prabhakar, RN, MSN, CDE  Diabetes Coordinator Inpatient Glycemic Control Team Team Pager [phone] (8am-5pm) 06/26/2019 9:41 AM

## 2019-06-26 NOTE — Progress Notes (Signed)
Pt had watch on L wrist that was cutting into his skin, offered to remove watch from pt who agreed. Offered to place pt's watch in locked safe or in pt belonging bags, states he wanted it in pt belonging bag. NT Johnna in room watched RN place pt's watch in bag.

## 2019-06-26 NOTE — Progress Notes (Signed)
2320- pt HR in 40s and 50s, SBP in 70s. MD notified, cardizem drip stopped.

## 2019-06-26 NOTE — Progress Notes (Signed)
MD [place] notified of CBG still reading in 400s.

## 2019-06-26 NOTE — Progress Notes (Signed)
Patient has gotten up twice out of bed, pulled condom catheter off and telemetry off. Patient is confused and continues to try to get out of bed and walk around. Patient is very unsteady. Bed alarm is on at this time and MD made aware.

## 2019-06-26 NOTE — Progress Notes (Signed)
*  PRELIMINARY RESULTS* Echocardiogram 2D Echocardiogram  With definityhas been performed.  Leavy Cella 06/26/2019, 11:53 AM

## 2019-06-26 NOTE — Progress Notes (Signed)
CRITICAL VALUE ALERT  Critical Value:  anerobic and aerobic blood cultures grew Gram + Cocci  Date & Time Notied:  06/26/2019 0845  Provider Notified: [place]  Orders Received/Actions taken: paged MD, awaiting orders

## 2019-06-26 NOTE — Plan of Care (Signed)
  Problem: Acute Rehab PT Goals(only PT should resolve) Goal: Pt Will Go Supine/Side To Sit Outcome: Progressing Flowsheets (Taken 06/26/2019 1236) Pt will go Supine/Side to Sit: with supervision Goal: Patient Will Transfer Sit To/From Stand Outcome: Progressing Flowsheets (Taken 06/26/2019 1236) Patient will transfer sit to/from stand:  with modified independence  with supervision Goal: Pt Will Transfer Bed To Chair/Chair To Bed Outcome: Progressing Flowsheets (Taken 06/26/2019 1236) Pt will Transfer Bed to Chair/Chair to Bed:  with supervision  with modified independence Goal: Pt Will Ambulate Outcome: Progressing Flowsheets (Taken 06/26/2019 1236) Pt will Ambulate:  75 feet  with supervision  with rolling walker   12:37 PM, 06/26/19 Lonell Grandchild, MPT Physical Therapist with [facility] [phone] office [phone] mobile phone

## 2019-06-26 NOTE — Progress Notes (Signed)
PROGRESS NOTE    Justin Ruiz  [id number] DOB: Jul 27, 1950 DOA: 06/25/2019 PCP: [name], MD     Brief Narrative:  As per H&P written by Dr. Carles Collet on 06/25/2019 69 y.o. male with medical history of diastolic CHF, diabetes mellitus type 2, MGUS, chronic respiratory failure on 3 L, hypertension, hyperlipidemia, non-Hodgkin's lymphoma presenting with 2-day history of shortness of breath.  The patient denied any fevers, chills, chest pain, nausea, vomiting, diarrhea, abdominal pain.  He complains of orthopnea type symptoms.  He has to sleep in a recliner for the past 2 months.  He has dyspnea on exertion.  He states that his lower extremity edema is about the same as usual.  He quit smoking 5 years ago after 40-pack-year history.  He has been drinking 5 x 20 ounce bottles of water on a daily basis, and complains of polydipsia.  His wife has been out of town for the past month visiting relatives in [place].  As result, the patient has been living alone with the help with his neighbor.  The patient endorsed compliance with all his medications, but he seems to have very poor insight regarding his medications when asked specifics about dosing and frequency.  Because of progressive shortness of breath, he activated EMS.  He was noted to have oxygen saturation on 80% on 3 L.  CBG was 498.  Heart rate was in the 170s. In the emergency department, the patient was tachycardic with heart rate in the 130s.  He had low-grade temperature of 99.0 F.  He was hemodynamically stable.  Oxygen saturation was 96% on 4 L.  Assessment & Plan: 1-acute on chronic respiratory failure with hypoxia -Patient chronically uses 3 L nasal cannula supplementation and on presentation was requiring around 4 L to keep O2 sats above 92%. -Denies any chest pain -Vascular congestion, elevated BNP and fluid overload appreciated on exam. -Continue IV diuresis -Low-sodium diet, daily weights and strict I's and O's -Diet indiscretion  most likely as a big factor for his exacerbation. -Follow renal function and electrolytes closely while actively diuresing him.  2-A. fib with RVR -Started on Cardizem drip and subsequently discontinue after controlling rate control and experiencing soft blood pressure. -Beta-blocker dosage has been adjusted -Continue monitoring on telemetry -Continue apixaban.  3-acute on chronic diastolic heart failure -Follow echo -Continue IV diuresis as mentioned in problem #1 -Long discussion about low-sodium diet entertained with patient -Follow daily weights and I's and O's. -Continue beta-blocker management.  4-lower extremity cellulitis (left more than right) -Continue the use of cefazolin -No active drainage appreciated.  5-positive blood cultures for Gram positive cocci (anaerobes anaerobic's bottles) -No fever, normal WBCs and no findings of systemic infection currently -Empirically receiving treatment with Ancef for lower extremity cellulitis -Will wait on final speciation in case that there is presence of a contaminant microorganism  6-morbid obesity -Body mass index is 43.63 kg/m. -Low calorie diet, portion control and increase physical activity discussed with patient.  7-uncontrolled type 2 diabetes with hyperglycemia -Follow A1c -Continue adjusted dose of NPH, start NovoLog 3 times daily for meal coverage and follow CBGs fluctuation for further adjustment on his medication regimen. -Holding oral hypoglycemic agents while inpatient.  8-essential hypertension -Continue metoprolol -Follow vital signs. -IV diuresis also helping.  9-acute kidney injury -In the setting of acute diuresis as of blood pressure after receiving treatment for A. fib with RVR. -Diuretic dosage has been adjusted -Follow renal function trend and urine output. -Electrolytes are stable.  10-COPD -Continue  DuoNeb and Pulmicort -very Mild wheezing appreciated but this could be trigger by vascular  congestion and acute CHF -Patient reports breathing is improving. -Holding on steroids at this time.  DVT prophylaxis: Eliquis. Code Status: Full code. Family Communication: No family at bedside.  (Patient's wife driving home from [place], as informed by patient himself). Disposition Plan: Continue current IV antibiotics for his lower extremity cellulitis, follow blood cultures speciation on final results; continue IV diuresis and follow echo results.  Patient will be transferred to telemetry bed.  Consultants:   None  Procedures:   See below for x-ray report  2D echo: Pending  Antimicrobials:  Anti-infectives (From admission, onward)   Start     Dose/Rate Route Frequency Ordered Stop   06/25/19 1800  ceFAZolin (ANCEF) IVPB 2g/100 mL premix     2 g 200 mL/hr over 30 Minutes Intravenous Every 8 hours 06/25/19 1712         Subjective: No fever, no chest pain, no nausea or vomiting.  Still with signs of fluid overload, shortness of breath on exertion and complaining of orthopnea.   Objective: Vitals:   06/26/19 1416 06/26/19 1430 06/26/19 1445 06/26/19 1500  BP:  106/67 115/63 (!) 164/64  Pulse:  87 79 69  Resp:  20 (!) 24 20  Temp:      TempSrc:      SpO2: 96% 90% 100% 97%  Weight:      Height:        Intake/Output Summary (Last 24 hours) at 06/26/2019 1543 Last data filed at 06/26/2019 1500 Gross per 24 hour  Intake 328.33 ml  Output 850 ml  Net -521.67 ml   Filed Weights   06/25/19 1115 06/25/19 1720 06/26/19 0500  Weight: 128.8 kg 134 kg 134 kg    Examination: General exam: Alert, awake, oriented x 3; reports breathing is improving and denies chest pain.  Still with signs of fluid overload on exam and experiencing shortness of breath on exertion. Respiratory system: Fine crackles appreciated at the bases; mild expiratory wheezing.  No using accessory muscles.  Good oxygen saturation on chronic 3 L O2 supplementation. Cardiovascular system: Rate  controlled, no rubs, no gallops, unable to properly assess JVD due to body habitus. Gastrointestinal system: Abdomen is obese, nondistended, soft and nontender. No organomegaly or masses felt. Normal bowel sounds heard. Central nervous system: Alert and oriented. No focal neurological deficits. Extremities: No cyanosis or clubbing appreciated on exam; 2+ edema bilaterally. Skin: Fungal dermatitis changes appreciated in his groin area and scrotum; lower extremity with erythematous changes and warm sensation to touch.  No open wounds or drainage appreciated. Psychiatry: Judgement and insight appear normal. Mood & affect appropriate.     Data Reviewed: I have personally reviewed following labs and imaging studies  CBC: Recent Labs  Lab 06/25/19 1120 06/26/19 0503  WBC 10.5 9.4  NEUTROABS 8.7*  --   HGB 12.4* 12.6*  HCT 38.6* 38.5*  MCV 94.1 93.0  PLT 238 [id number]   Basic Metabolic Panel: Recent Labs  Lab 06/25/19 1120 06/25/19 1408 06/26/19 0503  NA 133*  --  126*  K 4.4  --  4.4  CL 96*  --  89*  CO2 27  --  26  GLUCOSE 406*  --  459*  BUN 20  --  29*  CREATININE 1.15  --  1.97*  CALCIUM 8.9  --  8.7*  MG  --  1.8  --    GFR: Estimated Creatinine Clearance: 48.7  mL/min (A) (by C-G formula based on SCr of 1.97 mg/dL (H)).   Liver Function Tests: Recent Labs  Lab 06/25/19 1120  AST 15  ALT 16  ALKPHOS 64  BILITOT 1.6*  PROT 7.3  ALBUMIN 3.2*   Coagulation Profile: Recent Labs  Lab 06/25/19 1120  INR 1.1   HbA1C: Recent Labs    06/25/19 1120  HGBA1C 9.1*   CBG: Recent Labs  Lab 06/25/19 1525 06/25/19 1721 06/25/19 2020 06/26/19 0745 06/26/19 1102  GLUCAP 416* 437* 438* 433* 441*   Urine analysis:    Component Value Date/Time   COLORURINE YELLOW 06/25/2019 1133   APPEARANCEUR CLEAR 06/25/2019 1133   APPEARANCEUR Clear 05/30/2018 1147   LABSPEC 1.026 06/25/2019 1133   PHURINE 5.0 06/25/2019 1133   GLUCOSEU >=500 (A) 06/25/2019 1133   HGBUR SMALL  (A) 06/25/2019 1133   BILIRUBINUR NEGATIVE 06/25/2019 1133   BILIRUBINUR Negative 05/30/2018 1147   KETONESUR 20 (A) 06/25/2019 1133   PROTEINUR NEGATIVE 06/25/2019 1133   UROBILINOGEN negative 05/01/2013 1115   UROBILINOGEN 0.2 05/18/2010 0352   NITRITE NEGATIVE 06/25/2019 1133   LEUKOCYTESUR NEGATIVE 06/25/2019 1133    Recent Results (from the past 240 hour(s))  Culture, blood (routine x 2)     Status: None (Preliminary result)   Collection Time: 06/25/19 11:20 AM   Specimen: BLOOD LEFT ARM  Result Value Ref Range Status   Specimen Description   Final    BLOOD LEFT ARM Performed at [facility], [address] [place], [place] [address]    Special Requests   Final    BOTTLES DRAWN AEROBIC AND ANAEROBIC Blood Culture adequate volume Performed at [facility], [address], [place], [place] [address]    Culture  Setup Time   Final    GRAM POSITIVE COCCI AEROBIC AND ANAEROBIC BOTTLES CALLED TO S LASHLEY AT 0840 BY HFLYNT 06/26/19 PERFORMED AT [facility] Performed at [facility], [address] [place], [place] [address]    Culture [facility] POSITIVE COCCI  Final   Report Status PENDING  Incomplete  Culture, blood (routine x 2)     Status: None (Preliminary result)   Collection Time: 06/25/19 11:59 AM   Specimen: BLOOD RIGHT ARM  Result Value Ref Range Status   Specimen Description BLOOD RIGHT ARM  Final   Special Requests   Final    BOTTLES DRAWN AEROBIC AND ANAEROBIC Blood Culture adequate volume   Culture   Final    NO GROWTH < 24 HOURS Performed at [facility] [place], [address] [place],  [address]    Report Status PENDING  Incomplete  Respiratory Panel by RT PCR (Flu A&B, Covid) - Nasopharyngeal Swab     Status: None   Collection Time: 06/25/19  3:14 PM   Specimen: Nasopharyngeal Swab  Result Value Ref Range Status   SARS Coronavirus 2 by RT PCR NEGATIVE NEGATIVE Final    Comment: (NOTE) SARS-CoV-2 target nucleic acids are NOT DETECTED. The SARS-CoV-2 RNA is generally  detectable in upper respiratoy specimens during the acute phase of infection. The lowest concentration of SARS-CoV-2 viral copies this assay can detect is 131 copies/mL. A negative result does not preclude SARS-Cov-2 infection and should not be used as the sole basis for treatment or other patient management decisions. A negative result may occur with  improper specimen collection/handling, submission of specimen other than nasopharyngeal swab, presence of viral mutation(s) within the areas targeted by this assay, and inadequate number of viral copies (<131 copies/mL). A negative result  must be combined with clinical observations, patient history, and epidemiological information. The expected result is Negative. Fact Sheet for Patients:  PinkCheek.be Fact Sheet for Healthcare Providers:  GravelBags.it This test is not yet ap proved or cleared by the [place] FDA and  has been authorized for detection and/or diagnosis of SARS-CoV-2 by FDA under an Emergency Use Authorization (EUA). This EUA will remain  in effect (meaning this test can be used) for the duration of the COVID-19 declaration under Section 564(b)(1) of the Act, 21 U.S.C. section 360bbb-3(b)(1), unless the authorization is terminated or revoked sooner.    Influenza A by PCR NEGATIVE NEGATIVE Final   Influenza B by PCR NEGATIVE NEGATIVE Final    Comment: (NOTE) The Xpert Xpress SARS-CoV-2/FLU/RSV assay is intended as an aid in  the diagnosis of influenza from Nasopharyngeal swab specimens and  should not be used as a sole basis for treatment. Nasal washings and  aspirates are unacceptable for Xpert Xpress SARS-CoV-2/FLU/RSV  testing. Fact Sheet for Patients: PinkCheek.be Fact Sheet for Healthcare Providers: GravelBags.it This test is not yet approved or cleared by the [place] FDA and  has been  authorized for detection and/or diagnosis of SARS-CoV-2 by  FDA under an Emergency Use Authorization (EUA). This EUA will remain  in effect (meaning this test can be used) for the duration of the  Covid-19 declaration under Section 564(b)(1) of the Act, 21  U.S.C. section 360bbb-3(b)(1), unless the authorization is  terminated or revoked. Performed at [facility], [address] [place], [place] [address]   MRSA PCR Screening     Status: None   Collection Time: 06/25/19  5:18 PM   Specimen: Nasal Mucosa; Nasopharyngeal  Result Value Ref Range Status   MRSA by PCR NEGATIVE NEGATIVE Final    Comment:        The GeneXpert MRSA Assay (FDA approved for NASAL specimens only), is one component of a comprehensive MRSA colonization surveillance program. It is not intended to diagnose MRSA infection nor to guide or monitor treatment for MRSA infections. Performed at [facility], [address], [place], [place] [address]      Radiology Studies: [facility] 1 View  Result Date: 06/25/2019 CLINICAL DATA:  Shortness of breath EXAM: PORTABLE CHEST 1 VIEW COMPARISON:  11/01/2018 FINDINGS: The heart size and mediastinal contours are stable. Calcific aortic knob. Mild pulmonary vascular congestion. Minimal linear scarring versus atelectasis in the left lung base. No focal airspace consolidation, pleural effusion, or pneumothorax. The visualized skeletal structures are unremarkable. IMPRESSION: Mild pulmonary vascular congestion. Electronically Signed   By: Davina Poke D.O.   On: 06/25/2019 11:53   ECHOCARDIOGRAM COMPLETE  Result Date: 06/26/2019    ECHOCARDIOGRAM REPORT   Patient Name:   Justin Ruiz Date of Exam: 06/26/2019 Medical Rec #:  [id number]      Height:       69.0 in Accession #:    [id number]     Weight:       295.4 lb Date of Birth:  1950-06-17      BSA:          2.439 m Patient Age:    33 years       BP:           142/82 mmHg Patient Gender: M              HR:           106 bpm.  Exam Location:  [facility] Procedure:  2D Echo Indications:    CHF-Acute Diastolic [id number] / [id number]  History:        Patient has prior history of Echocardiogram examinations, most                 recent 03/29/2016. CHF, COPD, Arrythmias:Atrial Fibrillation;                 Risk Factors:Diabetes, Dyslipidemia, Hypertension and Former                 Smoker.  Sonographer:    Leavy Cella RDCS (AE) Referring Phys: [phone] DAVID TAT IMPRESSIONS  1. Left ventricular ejection fraction, by estimation, is 55 to 60%. The left ventricle has normal function. The left ventricle has no regional wall motion abnormalities. There is mild left ventricular hypertrophy. Left ventricular diastolic parameters are indeterminate.  2. Right ventricular systolic function is mildly reduced. The right ventricular size is mildly enlarged. There is mildly elevated pulmonary artery systolic pressure.  3. Left atrial size was severely dilated.  4. The mitral valve is normal in structure and function. No evidence of mitral valve regurgitation. No evidence of mitral stenosis.  5. The aortic valve has an indeterminant number of cusps. Aortic valve regurgitation is not visualized. No aortic stenosis is present.  6. The inferior vena cava is normal in size with greater than 50% respiratory variability, suggesting right atrial pressure of 3 mmHg. FINDINGS  Left Ventricle: Left ventricular ejection fraction, by estimation, is 55 to 60%. The left ventricle has normal function. The left ventricle has no regional wall motion abnormalities. The left ventricular internal cavity size was small. There is mild left ventricular hypertrophy. Left ventricular diastolic parameters are indeterminate. Right Ventricle: The right ventricular size is mildly enlarged. Right vetricular wall thickness was not assessed. Right ventricular systolic function is mildly reduced. There is mildly elevated pulmonary artery systolic pressure. The tricuspid regurgitant velocity is 2.68  m/s, and with an assumed right atrial pressure of 10 mmHg, the estimated right ventricular systolic pressure is 123XX123 mmHg. Left Atrium: Left atrial size was severely dilated. Right Atrium: Right atrial size was not well visualized. Pericardium: A small pericardial effusion is present. The pericardial effusion is circumferential. Mitral Valve: The mitral valve is normal in structure and function. No evidence of mitral valve regurgitation. No evidence of mitral valve stenosis. Tricuspid Valve: The tricuspid valve is normal in structure. Tricuspid valve regurgitation is trivial. No evidence of tricuspid stenosis. Aortic Valve: The aortic valve has an indeterminant number of cusps. . There is mild thickening and mild calcification of the aortic valve. Aortic valve regurgitation is not visualized. No aortic stenosis is present. Mild aortic valve annular calcification. There is mild thickening of the aortic valve. There is mild calcification of the aortic valve. Aortic valve mean gradient measures 3.4 mmHg. Aortic valve peak gradient measures 6.7 mmHg. Aortic valve area, by VTI measures 1.41 cm. Pulmonic Valve: The pulmonic valve was not well visualized. Pulmonic valve regurgitation is not visualized. No evidence of pulmonic stenosis. Aorta: The aortic root is normal in size and structure. Pulmonary Artery: Indeterminant PASP, inadequate TR jet. Venous: The inferior vena cava is normal in size with greater than 50% respiratory variability, suggesting right atrial pressure of 3 mmHg. IAS/Shunts: No atrial level shunt detected by color flow Doppler.  LEFT VENTRICLE PLAX 2D LVOT diam:     2.10 cm  Diastology LV SV:         36       LV e'  lateral:   8.18 cm/s LV SV Index:   15       LV E/e' lateral: 12.2 LVOT Area:     3.46 cm LV e' medial:    8.48 cm/s                         LV E/e' medial:  11.8  RIGHT VENTRICLE RV S prime:     8.92 cm/s TAPSE (M-mode): 1.4 cm LEFT ATRIUM              Index       RIGHT ATRIUM            Index LA Vol (A2C):   93.6 ml  38.38 ml/m RA Area:     18.40 cm LA Vol (A4C):   126.0 ml 51.67 ml/m RA Volume:   49.80 ml  20.42 ml/m LA Biplane Vol: 109.0 ml 44.70 ml/m  AORTIC VALVE AV Area (Vmax):    1.70 cm AV Area (Vmean):   1.60 cm AV Area (VTI):     1.41 cm AV Vmax:           129.71 cm/s AV Vmean:          85.586 cm/s AV VTI:            0.256 m AV Peak Grad:      6.7 mmHg AV Mean Grad:      3.4 mmHg LVOT Vmax:         63.54 cm/s LVOT Vmean:        39.503 cm/s LVOT VTI:          0.104 m LVOT/AV VTI ratio: 0.41 MITRAL VALVE               TRICUSPID VALVE MV Area (PHT): 4.49 cm    TR Peak grad:   28.7 mmHg MV Decel Time: 169 msec    TR Vmax:        268.00 cm/s MV E velocity: 99.90 cm/s MV A velocity: 21.30 cm/s  SHUNTS MV E/A ratio:  4.69        Systemic VTI:  0.10 m                            Systemic Diam: 2.10 cm Carlyle Dolly MD Electronically signed by Carlyle Dolly MD Signature Date/Time: 06/26/2019/12:47:29 PM    Final     Scheduled Meds: . apixaban  5 mg Oral BID  . budesonide (PULMICORT) nebulizer solution  0.5 mg Nebulization BID  . Chlorhexidine Gluconate Cloth  6 each Topical Q0600  . furosemide  40 mg Intravenous BID  . insulin aspart  0-15 Units Subcutaneous TID WC  . insulin aspart  0-5 Units Subcutaneous QHS  . insulin aspart  10 Units Subcutaneous TID WC  . insulin NPH Human  60 Units Subcutaneous BID AC  . ipratropium-albuterol  3 mL Nebulization Q6H  . mouth rinse  15 mL Mouth Rinse BID  . metoprolol tartrate  125 mg Oral BID  . nystatin   Topical BID  . PARoxetine  20 mg Oral Daily  . simvastatin  40 mg Oral Daily  . sodium chloride flush  3 mL Intravenous Q12H   Continuous Infusions: . sodium chloride    .  ceFAZolin (ANCEF) IV 2 g (06/26/19 0930)     LOS: 1 day    Time spent: 35 minutes.   Barton Dubois, MD [facility] Pager [phone]   06/26/2019, 3:43 PM

## 2019-06-26 NOTE — Evaluation (Signed)
Physical Therapy Evaluation Patient Details Name: Justin Ruiz MRN: [id number] DOB: 09-30-1950 Today's Date: 06/26/2019   History of Present Illness  Justin Ruiz is a 69 y.o. male with medical history of diastolic CHF, diabetes mellitus type 2, MGUS, chronic respiratory failure on 3 L, hypertension, hyperlipidemia, non-Hodgkin's lymphoma presenting with 2-day history of shortness of breath.  The patient denied any fevers, chills, chest pain, nausea, vomiting, diarrhea, abdominal pain.  He complains of orthopnea type symptoms.  He has to sleep in a recliner for the past 2 months.  He has dyspnea on exertion.  He states that his lower extremity edema is about the same as usual.  He quit smoking 5 years ago after 40-pack-year history.  He has been drinking 5 x 20 ounce bottles of water on a daily basis, and complains of polydipsia.  His wife has been out of town for the past month visiting relatives in [place].  As result, the patient has been living alone with the help with his neighbor.  The patient endorsed compliance with all his medications, but he seems to have very poor insight regarding his medications when asked specifics about dosing and frequency.  Because of progressive shortness of breath, he activated EMS.  He was noted to have oxygen saturation on 80% on 3 L.  CBG was 498.  Heart rate was in the 170s.In the emergency department, the patient was tachycardic with heart rate in the 130s.  He had low-grade temperature of 99.0 F.  He was hemodynamically stable.  Oxygen saturation was 96% on 4 L.    Clinical Impression  Patient has difficulty sitting up at bedside requiring Min assist to help pull self to sitting, unsteady on feet without AD, required use of RW for safety, able to ambulate in hallway without loss of balance while on 2 LPM O2, SpO2 drooped from 95% to 84%, limited secondary to fatigue and mild SOB, once seated in chair SpO2 increased above 90% and patient able to stay up in chair  to eat lunch - RN aware.  Patient will benefit from continued physical therapy in hospital and recommended venue below to increase strength, balance, endurance for safe ADLs and gait.    Follow Up Recommendations Home health PT;Supervision for mobility/OOB;Supervision - Intermittent    Equipment Recommendations  None recommended by PT    Recommendations for Other Services       Precautions / Restrictions Precautions Precautions: Fall Restrictions Weight Bearing Restrictions: No      Mobility  Bed Mobility Overal bed mobility: Needs Assistance Bed Mobility: Supine to Sit     Supine to sit: Min assist;HOB elevated     General bed mobility comments: increased time, labored movement  Transfers Overall transfer level: Needs assistance Equipment used: None;Rolling walker (2 wheeled) Transfers: Sit to/from [facility] Sit to Stand: Min guard Stand pivot transfers: Min guard       General transfer comment: slightly unsteady, safer using RW  Ambulation/Gait Ambulation/Gait assistance: Min guard Gait Distance (Feet): 55 Feet Assistive device: Rolling walker (2 wheeled) Gait Pattern/deviations: Decreased step length - right;Decreased step length - left;Decreased stride length Gait velocity: decreased   General Gait Details: slow labored cadence without loss of balance, on 2 LPM with SpO2 dropping to 84%, limited secondary to c/o fatigue  Stairs            Wheelchair Mobility    Modified Rankin (Stroke Patients Only)       Balance Overall balance assessment: Needs  assistance Sitting-balance support: Feet supported;No upper extremity supported Sitting balance-Leahy Scale: Fair Sitting balance - Comments: fair/good seated at EOB   Standing balance support: During functional activity;No upper extremity supported Standing balance-Leahy Scale: Poor Standing balance comment: fair/poor without AD, fair/good using RW                              Pertinent Vitals/Pain Pain Assessment: No/denies pain    Home Living Family/patient expects to be discharged to:: Private residence Living Arrangements: Spouse/significant other Available Help at Discharge: Family;Available 24 hours/day Type of Home: House Home Access: Stairs to enter Entrance Stairs-Rails: Left Entrance Stairs-Number of Steps: 3 Home Layout: One level Home Equipment: Walker - 2 wheels;Cane - single point      Prior Function Level of Independence: Independent         Comments: Hydrographic surveyor, drives     Hand Dominance   Dominant Hand: Right    Extremity/Trunk Assessment   Upper Extremity Assessment Upper Extremity Assessment: Generalized weakness    Lower Extremity Assessment Lower Extremity Assessment: Generalized weakness    Cervical / Trunk Assessment Cervical / Trunk Assessment: Normal  Communication   Communication: No difficulties  Cognition Arousal/Alertness: Awake/alert Behavior During Therapy: WFL for tasks assessed/performed Overall Cognitive Status: Within Functional Limits for tasks assessed                                        General Comments      Exercises     Assessment/Plan    PT Assessment Patient needs continued PT services  PT Problem List Decreased strength;Decreased activity tolerance;Decreased balance;Decreased mobility       PT Treatment Interventions Balance training;Gait training;Stair training;Functional mobility training;Therapeutic activities;Therapeutic exercise;Patient/family education    PT Goals (Current goals can be found in the Care Plan section)  Acute Rehab PT Goals Patient Stated Goal: return home with family to assist PT Goal Formulation: With patient Time For Goal Achievement: 07/03/19 Potential to Achieve Goals: Good    Frequency Min 3X/week   Barriers to discharge        Co-evaluation               AM-PAC PT "6 Clicks" Mobility  Outcome  Measure Help needed turning from your back to your side while in a flat bed without using bedrails?: A Little Help needed moving from lying on your back to sitting on the side of a flat bed without using bedrails?: A Little Help needed moving to and from a bed to a chair (including a wheelchair)?: A Little Help needed standing up from a chair using your arms (e.g., wheelchair or bedside chair)?: A Little Help needed to walk in hospital room?: A Little Help needed climbing 3-5 steps with a railing? : A Lot 6 Click Score: 17    End of Session Equipment Utilized During Treatment: Oxygen Activity Tolerance: Patient tolerated treatment well;Patient limited by fatigue Patient left: in chair;with call bell/phone within reach Nurse Communication: Mobility status PT Visit Diagnosis: Unsteadiness on feet (R26.81);Other abnormalities of gait and mobility (R26.89);Muscle weakness (generalized) (M62.81)    Time: [id number] PT Time Calculation (min) (ACUTE ONLY): 26 min   Charges:   PT Evaluation $PT Eval Moderate Complexity: 1 Mod PT Treatments $Therapeutic Activity: 23-37 mins        12:35 PM, 06/26/19 Lonell Grandchild, MPT Physical  Therapist with [facility] [phone] office [phone] mobile phone

## 2019-06-27 DIAGNOSIS — R739 Hyperglycemia, unspecified: Secondary | ICD-10-CM

## 2019-06-27 LAB — BASIC METABOLIC PANEL
Anion gap: 12 (ref 5–15)
BUN: 37 mg/dL — ABNORMAL HIGH (ref 8–23)
CO2: 27 mmol/L (ref 22–32)
Calcium: 8.7 mg/dL — ABNORMAL LOW (ref 8.9–10.3)
Chloride: 93 mmol/L — ABNORMAL LOW (ref 98–111)
Creatinine, Ser: 1.63 mg/dL — ABNORMAL HIGH (ref 0.61–1.24)
GFR calc Af Amer: 49 mL/min — ABNORMAL LOW (ref >60)
GFR calc non Af Amer: 43 mL/min — ABNORMAL LOW (ref >60)
Glucose, Bld: 161 mg/dL — ABNORMAL HIGH (ref 70–99)
Potassium: 3.5 mmol/L (ref 3.5–5.1)
Sodium: 132 mmol/L — ABNORMAL LOW (ref 135–145)

## 2019-06-27 LAB — GLUCOSE, CAPILLARY
Glucose-Capillary: 145 mg/dL — ABNORMAL HIGH (ref 70–99)
Glucose-Capillary: 326 mg/dL — ABNORMAL HIGH (ref 70–99)
Glucose-Capillary: 254 mg/dL — ABNORMAL HIGH (ref 70–99)
Glucose-Capillary: 341 mg/dL — ABNORMAL HIGH (ref 70–99)

## 2019-06-27 MED ORDER — INSULIN ASPART 100 UNIT/ML ~~LOC~~ SOLN
15.0000 [IU] | Freq: Three times a day (TID) | SUBCUTANEOUS | Status: DC
  Administered 2019-06-27 – 2019-06-28 (×3): 15 [IU] via SUBCUTANEOUS

## 2019-06-27 MED ORDER — BUDESONIDE 0.25 MG/2ML IN SUSP
RESPIRATORY_TRACT | Status: AC
  Administered 2019-06-27: 0.25 mg
  Filled 2019-06-27: qty 2

## 2019-06-27 MED ORDER — METOPROLOL TARTRATE 50 MG PO TABS
100.0000 mg | ORAL_TABLET | Freq: Two times a day (BID) | ORAL | Status: DC
  Administered 2019-06-27: 100 mg via ORAL
  Filled 2019-06-27 (×2): qty 2

## 2019-06-27 NOTE — Evaluation (Signed)
Occupational Therapy Evaluation Patient Details Name: Justin Ruiz MRN: [id number] DOB: 1950/10/25 Today's Date: 06/27/2019    History of Present Illness Justin Ruiz is a 69 y.o. male with medical history of diastolic CHF, diabetes mellitus type 2, MGUS, chronic respiratory failure on 3 L, hypertension, hyperlipidemia, non-Hodgkin's lymphoma presenting with 2-day history of shortness of breath.  The patient denied any fevers, chills, chest pain, nausea, vomiting, diarrhea, abdominal pain.  He complains of orthopnea type symptoms.  He has to sleep in a recliner for the past 2 months.  He has dyspnea on exertion.  He states that his lower extremity edema is about the same as usual.  He quit smoking 5 years ago after 40-pack-year history.  He has been drinking 5 x 20 ounce bottles of water on a daily basis, and complains of polydipsia.  His wife has been out of town for the past month visiting relatives in [place].  As result, the patient has been living alone with the help with his neighbor.  The patient endorsed compliance with all his medications, but he seems to have very poor insight regarding his medications when asked specifics about dosing and frequency.  Because of progressive shortness of breath, he activated EMS.  He was noted to have oxygen saturation on 80% on 3 L.  CBG was 498.  Heart rate was in the 170s.In the emergency department, the patient was tachycardic with heart rate in the 130s.  He had low-grade temperature of 99.0 F.  He was hemodynamically stable.  Oxygen saturation was 96% on 4 L.   Clinical Impression   Pt agreeable to OT evaluation this am, reports he is feeling much better today compared to yesterday. Pt performing mobility tasks with min guard, cuing for awareness of IV and O2 lines. Pt performing ADLs with set-up to min guard, primarily limited by decreased activity tolerance. Recommend HHOT evaluation to assess functioning in the home environment and educate on  strategies to improve safety in the home and implement energy conservation strategies during daily tasks. No further acute OT services required at this time.     Follow Up Recommendations  Home health OT    Equipment Recommendations  Tub/shower seat       Precautions / Restrictions Precautions Precautions: Fall Restrictions Weight Bearing Restrictions: No      Mobility Bed Mobility Overal bed mobility: Needs Assistance Bed Mobility: Supine to Sit     Supine to sit: Min assist     General bed mobility comments: increased time, labored movement  Transfers Overall transfer level: Needs assistance Equipment used: Rolling walker (2 wheeled) Transfers: Sit to/from [facility] Sit to Stand: Min guard Stand pivot transfers: Min guard                ADL either performed or assessed with clinical judgement   ADL Overall ADL's : Needs assistance/impaired Eating/Feeding: Modified independent;Sitting               Upper Body Dressing : Supervision/safety;Sitting       Toilet Transfer: Min Insurance claims handler Details (indicate cue type and reason): simulated with bed to chair transfer         Functional mobility during ADLs: Min guard;Rolling walker General ADL Comments: Pt requiring slightly increased time for ADLs due to fatigue and decreased activity tolerance     Vision Baseline Vision/History: No visual deficits Patient Visual Report: No change from baseline Vision Assessment?: No apparent visual deficits  Pertinent Vitals/Pain Pain Assessment: No/denies pain     Hand Dominance Right   Extremity/Trunk Assessment Upper Extremity Assessment Upper Extremity Assessment: Overall WFL for tasks assessed   Lower Extremity Assessment Lower Extremity Assessment: Defer to PT evaluation   Cervical / Trunk Assessment Cervical / Trunk Assessment: Normal   Communication Communication Communication: No  difficulties   Cognition Arousal/Alertness: Awake/alert Behavior During Therapy: WFL for tasks assessed/performed Overall Cognitive Status: Within Functional Limits for tasks assessed                                                Home Living Family/patient expects to be discharged to:: Private residence Living Arrangements: Spouse/significant other Available Help at Discharge: Family;Available PRN/intermittently Type of Home: House Home Access: Stairs to enter [employer] of Steps: 3 Entrance Stairs-Rails: Left Home Layout: One level     Bathroom Shower/Tub: Teacher, early years/pre: Handicapped height     Home Equipment: Environmental consultant - 2 wheels;Cane - single point          Prior Functioning/Environment Level of Independence: Independent        Comments: Hydrographic surveyor, drives, independent with ADLs        OT Problem List: Cardiopulmonary status limiting activity;Decreased activity tolerance       End of Session Equipment Utilized During Treatment: Gait belt;Rolling walker;Oxygen Nurse Communication: Mobility status;Other (comment)(up in chair, condom cathetor off)  Activity Tolerance: Patient tolerated treatment well Patient left: in chair;with call bell/phone within reach;with chair alarm set  OT Visit Diagnosis: Unsteadiness on feet (R26.81);Muscle weakness (generalized) (M62.81)                Time: [id number] OT Time Calculation (min): 23 min Charges:  OT General Charges $OT Visit: 1 Visit OT Evaluation $OT Eval Low Complexity: [place], OTR/L  [phone] 06/27/2019, 8:07 AM

## 2019-06-27 NOTE — Progress Notes (Signed)
Spoke with patient's wife, Tobe Sos, who called for an update. Informed her that patient says he's feeling and doing a lot better, and that he could possibly discharge tomorrow per MD.

## 2019-06-27 NOTE — Progress Notes (Signed)
Inpatient Diabetes Program Recommendations  AACE/ADA: New Consensus Statement on Inpatient Glycemic Control (2015)  Target Ranges:  Prepandial:   less than 140 mg/dL      Peak postprandial:   less than 180 mg/dL (1-2 hours)      Critically ill patients:  140 - 180 mg/dL   Lab Results  Component Value Date   GLUCAP 326 (H) 06/27/2019   HGBA1C 9.1 (H) 06/25/2019    Review of Glycemic Control Results for Justin Ruiz, Justin Ruiz (MRN [id number]) as of 06/27/2019 13:55  Ref. Range 06/26/2019 11:02 06/26/2019 16:01 06/26/2019 21:13 06/27/2019 07:37 06/27/2019 11:37  Glucose-Capillary Latest Ref Range: 70 - 99 mg/dL 441 (H) 286 (H) 185 (H) 145 (H) 326 (H)  Diabetes history: DM2 Outpatient Diabetes medications: Novolin N 55 units bid + Novolog R 15 units bid Current orders for Inpatient glycemic control: NPH insulin 60 units + Novolog moderate correction tid + hs 0-5 units + Novolog 10 units tid with meals (hold if patient eats less than 50%) Inpatient Diabetes Program Recommendations:    If appropriate, consider increasing Novolog to 15 units tid with meals (this is home dose).   Thanks  Adah Perl, RN, BC-ADM Inpatient Diabetes Coordinator Pager [phone] (8a-5p)

## 2019-06-27 NOTE — Progress Notes (Signed)
PROGRESS NOTE    Justin Ruiz  [id number] DOB: 08/31/1950 DOA: 06/25/2019 PCP: [name], MD     Brief Narrative:  As per H&P written by Dr. Carles Collet on 06/25/2019 69 y.o. male with medical history of diastolic CHF, diabetes mellitus type 2, MGUS, chronic respiratory failure on 3 L, hypertension, hyperlipidemia, non-Hodgkin's lymphoma presenting with 2-day history of shortness of breath.  The patient denied any fevers, chills, chest pain, nausea, vomiting, diarrhea, abdominal pain.  He complains of orthopnea type symptoms.  He has to sleep in a recliner for the past 2 months.  He has dyspnea on exertion.  He states that his lower extremity edema is about the same as usual.  He quit smoking 5 years ago after 40-pack-year history.  He has been drinking 5 x 20 ounce bottles of water on a daily basis, and complains of polydipsia.  His wife has been out of town for the past month visiting relatives in [place].  As result, the patient has been living alone with the help with his neighbor.  The patient endorsed compliance with all his medications, but he seems to have very poor insight regarding his medications when asked specifics about dosing and frequency.  Because of progressive shortness of breath, he activated EMS.  He was noted to have oxygen saturation on 80% on 3 L.  CBG was 498.  Heart rate was in the 170s. In the emergency department, the patient was tachycardic with heart rate in the 130s.  He had low-grade temperature of 99.0 F.  He was hemodynamically stable.  Oxygen saturation was 96% on 4 L.  Assessment & Plan: 1-acute on chronic respiratory failure with hypoxia -Patient chronically uses 3 L nasal cannula supplementation and on presentation was requiring around 4 L to keep O2 sats above 92%. -Denies any chest pain -Vascular congestion, elevated BNP and fluid overload appreciated on exam. -Continue IV diuresis for another 24 hours; patient significant improvement hopefully able to go  home in the next 24 to 48 hours. -Low-sodium diet, daily weights and strict I's and O's -Diet indiscretion most likely as a big factor for his exacerbation. -Continue to follow renal function and electrolytes closely while actively diuresing him.  2-A. fib with RVR -Started on Cardizem drip and subsequently discontinue after achieving rate control and experiencing soft blood pressure. -Beta-blocker dosage has been adjusted -Blood pressure so far stable and tolerating current treatment. -Continue monitoring on telemetry -Continue apixaban.  3-acute on chronic diastolic heart failure -Echo demonstrating preserved ejection fraction and no wall motion normalities. -Continue IV diuresis as mentioned in problem #1 -Long discussion about low-sodium diet entertained with patient -Follow daily weights and I's and O's. -Continue beta-blocker management.  4-lower extremity cellulitis (left more than right) -Continue the use of cefazolin -No active drainage appreciated.  5-positive blood cultures for Gram positive cocci (anaerobes anaerobic's bottles) -No fever, normal WBCs and no findings of systemic infection currently -Empirically receiving treatment with Ancef for lower extremity cellulitis -Will wait on final speciation in case that there is presence of a contaminant microorganism  6-morbid obesity -Body mass index is 43.59 kg/m. -Low calorie diet, portion control and increase physical activity discussed with patient.  7-uncontrolled and poorly controlled type 2 diabetes with hyperglycemia -A1c 9.1 -Continue adjusted dose of NPH, continue adjusted NovoLog 3 times daily for meal coverage and follow CBGs fluctuation for further adjustment on his medication regimen. -Continue holding oral hypoglycemic agents while inpatient.  8-essential hypertension -Continue metoprolol -Follow vital signs. -IV  diuresis also helping.  9-acute kidney injury -In the setting of acute diuresis as of  blood pressure after receiving treatment for A. fib with RVR. -Diuretic dosage has been adjusted -Follow renal function trend and urine output. -Electrolytes are stable.  10-COPD -Continue DuoNeb and Pulmicort -very Mild wheezing appreciated but this could be trigger by vascular congestion and acute CHF -Patient reports breathing is improving. -not need for steroids at this time.  DVT prophylaxis: Eliquis. Code Status: Full code. Family Communication: No family at bedside.  (Patient's wife driving home from [place], as informed by patient himself). Disposition Plan: Continue current IV antibiotics for his lower extremity cellulitis, blood cultures has demonstrated to be contaminants and no further treatment will be anticipated for this.  Echo with diastolic dysfunction, no wall motion normalities and preserved ejection fraction.  Continue IV diuresis for another 24 hours if patient remains stable will be able to discharge home on 06/28/2019.  Consultants:   None  Procedures:   See below for x-ray report  2D echo:  1. Left ventricular ejection fraction, by estimation, is 55 to 60%. The  left ventricle has normal function. The left ventricle has no regional  wall motion abnormalities. There is mild left ventricular hypertrophy.  Left ventricular diastolic parameters  are indeterminate.  2. Right ventricular systolic function is mildly reduced. The right  ventricular size is mildly enlarged. There is mildly elevated pulmonary  artery systolic pressure.  3. Left atrial size was severely dilated.  4. The mitral valve is normal in structure and function. No evidence of  mitral valve regurgitation. No evidence of mitral stenosis.  5. The aortic valve has an indeterminant number of cusps. Aortic valve  regurgitation is not visualized. No aortic stenosis is present.  6. The inferior vena cava is normal in size with greater than 50%  respiratory variability, suggesting right  atrial pressure of 3 mmHg.   Antimicrobials:  Anti-infectives (From admission, onward)   Start     Dose/Rate Route Frequency Ordered Stop   06/25/19 1800  ceFAZolin (ANCEF) IVPB 2g/100 mL premix     2 g 200 mL/hr over 30 Minutes Intravenous Every 8 hours 06/25/19 1712         Subjective: No fever, no chest pain, no nausea, no vomiting.  Still will positive signs of fluid overload on exam (even significantly improved); patient reports increased urine output.  Still mildly orthopneic.  Objective: Vitals:   06/27/19 0643 06/27/19 0807 06/27/19 1351 06/27/19 1409  BP: 107/62  97/67   Pulse: (!) 59  94   Resp: 20  20   Temp:   98 F (36.7 C)   TempSrc:   Oral   SpO2: 99% 98% 97% 96%  Weight:      Height:        Intake/Output Summary (Last 24 hours) at 06/27/2019 1622 Last data filed at 06/27/2019 1139 Gross per 24 hour  Intake --  Output 800 ml  Net -800 ml   Filed Weights   06/25/19 1720 06/26/19 0500 06/27/19 0431  Weight: 134 kg 134 kg 133.9 kg    Examination: General exam: Alert, awake, oriented x 3; still slightly short of breath on exertion but reports improvement in overall breathing.  Patient also expressed mild orthopnea.  Positive fluid overload on exam (even significantly improved). Respiratory system: Decreased breath sounds at the bases, no wheezing, no using accessory muscles; stable oxygen saturation on chronic 3 L supplementation.  Positive rhonchi bilaterally. Cardiovascular system:Rate controlled. No  rubs or gallops;  murmurs, rubs, gallops. Gastrointestinal system: Abdomen is obese, nondistended, soft and nontender. No organomegaly or masses felt. Normal bowel sounds heard. Central nervous system: Alert and oriented. No focal neurological deficits. Extremities: No cyanosis or clubbing.  1+ edema appreciated bilaterally. Skin: No improved erythematous changes on his lower extremities; no open wounds.  Groin and scrotum skin also less red. Psychiatry:  Judgement and insight appear normal. Mood & affect appropriate.    Data Reviewed: I have personally reviewed following labs and imaging studies  CBC: Recent Labs  Lab 06/25/19 1120 06/26/19 0503  WBC 10.5 9.4  NEUTROABS 8.7*  --   HGB 12.4* 12.6*  HCT 38.6* 38.5*  MCV 94.1 93.0  PLT 238 [id number]   Basic Metabolic Panel: Recent Labs  Lab 06/25/19 1120 06/25/19 1408 06/26/19 0503 06/27/19 0407  NA 133*  --  126* 132*  K 4.4  --  4.4 3.5  CL 96*  --  89* 93*  CO2 27  --  26 27  GLUCOSE 406*  --  459* 161*  BUN 20  --  29* 37*  CREATININE 1.15  --  1.97* 1.63*  CALCIUM 8.9  --  8.7* 8.7*  MG  --  1.8  --   --    GFR: Estimated Creatinine Clearance: 58.9 mL/min (A) (by C-G formula based on SCr of 1.63 mg/dL (H)).   Liver Function Tests: Recent Labs  Lab 06/25/19 1120  AST 15  ALT 16  ALKPHOS 64  BILITOT 1.6*  PROT 7.3  ALBUMIN 3.2*   Coagulation Profile: Recent Labs  Lab 06/25/19 1120  INR 1.1   HbA1C: Recent Labs    06/25/19 1120  HGBA1C 9.1*   CBG: Recent Labs  Lab 06/26/19 1102 06/26/19 1601 06/26/19 2113 06/27/19 0737 06/27/19 1137  GLUCAP 441* 286* 185* 145* 326*   Urine analysis:    Component Value Date/Time   COLORURINE YELLOW 06/25/2019 1133   APPEARANCEUR CLEAR 06/25/2019 1133   APPEARANCEUR Clear 05/30/2018 1147   LABSPEC 1.026 06/25/2019 1133   PHURINE 5.0 06/25/2019 1133   GLUCOSEU >=500 (A) 06/25/2019 1133   HGBUR SMALL (A) 06/25/2019 1133   BILIRUBINUR NEGATIVE 06/25/2019 1133   BILIRUBINUR Negative 05/30/2018 1147   KETONESUR 20 (A) 06/25/2019 1133   PROTEINUR NEGATIVE 06/25/2019 1133   UROBILINOGEN negative 05/01/2013 1115   UROBILINOGEN 0.2 05/18/2010 0352   NITRITE NEGATIVE 06/25/2019 1133   LEUKOCYTESUR NEGATIVE 06/25/2019 1133    Recent Results (from the past 240 hour(s))  Culture, blood (routine x 2)     Status: Abnormal (Preliminary result)   Collection Time: 06/25/19 11:20 AM   Specimen: BLOOD LEFT ARM  Result  Value Ref Range Status   Specimen Description   Final    BLOOD LEFT ARM Performed at [facility], [address], [place], [place] [address]    Special Requests   Final    BOTTLES DRAWN AEROBIC AND ANAEROBIC Blood Culture adequate volume Performed at [facility], [address], [place], [place] [address]    Culture  Setup Time   Final    GRAM POSITIVE COCCI AEROBIC AND ANAEROBIC BOTTLES CALLED TO S LASHLEY AT 0840 BY HFLYNT 06/26/19 PERFORMED AT [facility] Performed at [facility], [address] [place], [place] [address]    Culture (A)  Final    STAPHYLOCOCCUS SPECIES (COAGULASE NEGATIVE) THE SIGNIFICANCE OF ISOLATING THIS ORGANISM FROM A SINGLE SET OF BLOOD CULTURES WHEN MULTIPLE SETS ARE DRAWN IS UNCERTAIN. PLEASE NOTIFY THE MICROBIOLOGY  DEPARTMENT WITHIN ONE WEEK IF SPECIATION AND SENSITIVITIES ARE REQUIRED. Performed at [facility], [place] [address] [place], [place] [address]    Report Status PENDING  Incomplete  Culture, blood (routine x 2)     Status: None (Preliminary result)   Collection Time: 06/25/19 11:59 AM   Specimen: BLOOD RIGHT ARM  Result Value Ref Range Status   Specimen Description BLOOD RIGHT ARM  Final   Special Requests   Final    BOTTLES DRAWN AEROBIC AND ANAEROBIC Blood Culture adequate volume   Culture   Final    NO GROWTH 2 DAYS Performed at [facility], [address], [place], [place] [address]    Report Status PENDING  Incomplete  Respiratory Panel by RT PCR (Flu A&B, Covid) - Nasopharyngeal Swab     Status: None   Collection Time: 06/25/19  3:14 PM   Specimen: Nasopharyngeal Swab  Result Value Ref Range Status   SARS Coronavirus 2 by RT PCR NEGATIVE NEGATIVE Final    Comment: (NOTE) SARS-CoV-2 target nucleic acids are NOT DETECTED. The SARS-CoV-2 RNA is generally detectable in upper respiratoy specimens during the acute phase of infection. The lowest concentration of SARS-CoV-2 viral copies this assay can detect is 131 copies/mL. A negative  result does not preclude SARS-Cov-2 infection and should not be used as the sole basis for treatment or other patient management decisions. A negative result may occur with  improper specimen collection/handling, submission of specimen other than nasopharyngeal swab, presence of viral mutation(s) within the areas targeted by this assay, and inadequate number of viral copies (<131 copies/mL). A negative result must be combined with clinical observations, patient history, and epidemiological information. The expected result is Negative. Fact Sheet for Patients:  PinkCheek.be Fact Sheet for Healthcare Providers:  GravelBags.it This test is not yet ap proved or cleared by the [place] FDA and  has been authorized for detection and/or diagnosis of SARS-CoV-2 by FDA under an Emergency Use Authorization (EUA). This EUA will remain  in effect (meaning this test can be used) for the duration of the COVID-19 declaration under Section 564(b)(1) of the Act, 21 U.S.C. section 360bbb-3(b)(1), unless the authorization is terminated or revoked sooner.    Influenza A by PCR NEGATIVE NEGATIVE Final   Influenza B by PCR NEGATIVE NEGATIVE Final    Comment: (NOTE) The Xpert Xpress SARS-CoV-2/FLU/RSV assay is intended as an aid in  the diagnosis of influenza from Nasopharyngeal swab specimens and  should not be used as a sole basis for treatment. Nasal washings and  aspirates are unacceptable for Xpert Xpress SARS-CoV-2/FLU/RSV  testing. Fact Sheet for Patients: PinkCheek.be Fact Sheet for Healthcare Providers: GravelBags.it This test is not yet approved or cleared by the [place] FDA and  has been authorized for detection and/or diagnosis of SARS-CoV-2 by  FDA under an Emergency Use Authorization (EUA). This EUA will remain  in effect (meaning this test can be used) for the  duration of the  Covid-19 declaration under Section 564(b)(1) of the Act, 21  U.S.C. section 360bbb-3(b)(1), unless the authorization is  terminated or revoked. Performed at [facility], [address], [place], [place] [address]   MRSA PCR Screening     Status: None   Collection Time: 06/25/19  5:18 PM   Specimen: Nasal Mucosa; Nasopharyngeal  Result Value Ref Range Status   MRSA by PCR NEGATIVE NEGATIVE Final    Comment:        The GeneXpert MRSA Assay (FDA approved for  NASAL specimens only), is one component of a comprehensive MRSA colonization surveillance program. It is not intended to diagnose MRSA infection nor to guide or monitor treatment for MRSA infections. Performed at [facility], [address], [place], [place] [address]      Radiology Studies: ECHOCARDIOGRAM COMPLETE  Result Date: 06/26/2019    ECHOCARDIOGRAM REPORT   Patient Name:   Justin Ruiz Date of Exam: 06/26/2019 Medical Rec #:  [id number]      Height:       69.0 in Accession #:    [id number]     Weight:       295.4 lb Date of Birth:  1950/05/01      BSA:          2.439 m Patient Age:    89 years       BP:           142/82 mmHg Patient Gender: M              HR:           106 bpm. Exam Location:  [facility] Procedure: 2D Echo Indications:    CHF-Acute Diastolic [id number] / [id number]  History:        Patient has prior history of Echocardiogram examinations, most                 recent 03/29/2016. CHF, COPD, Arrythmias:Atrial Fibrillation;                 Risk Factors:Diabetes, Dyslipidemia, Hypertension and Former                 Smoker.  Sonographer:    Leavy Cella RDCS (AE) Referring Phys: [phone] DAVID TAT IMPRESSIONS  1. Left ventricular ejection fraction, by estimation, is 55 to 60%. The left ventricle has normal function. The left ventricle has no regional wall motion abnormalities. There is mild left ventricular hypertrophy. Left ventricular diastolic parameters are indeterminate.  2. Right ventricular systolic  function is mildly reduced. The right ventricular size is mildly enlarged. There is mildly elevated pulmonary artery systolic pressure.  3. Left atrial size was severely dilated.  4. The mitral valve is normal in structure and function. No evidence of mitral valve regurgitation. No evidence of mitral stenosis.  5. The aortic valve has an indeterminant number of cusps. Aortic valve regurgitation is not visualized. No aortic stenosis is present.  6. The inferior vena cava is normal in size with greater than 50% respiratory variability, suggesting right atrial pressure of 3 mmHg. FINDINGS  Left Ventricle: Left ventricular ejection fraction, by estimation, is 55 to 60%. The left ventricle has normal function. The left ventricle has no regional wall motion abnormalities. The left ventricular internal cavity size was small. There is mild left ventricular hypertrophy. Left ventricular diastolic parameters are indeterminate. Right Ventricle: The right ventricular size is mildly enlarged. Right vetricular wall thickness was not assessed. Right ventricular systolic function is mildly reduced. There is mildly elevated pulmonary artery systolic pressure. The tricuspid regurgitant velocity is 2.68 m/s, and with an assumed right atrial pressure of 10 mmHg, the estimated right ventricular systolic pressure is 123XX123 mmHg. Left Atrium: Left atrial size was severely dilated. Right Atrium: Right atrial size was not well visualized. Pericardium: A small pericardial effusion is present. The pericardial effusion is circumferential. Mitral Valve: The mitral valve is normal in structure and function. No evidence of mitral valve regurgitation. No evidence of mitral valve stenosis. Tricuspid Valve: The tricuspid valve is  normal in structure. Tricuspid valve regurgitation is trivial. No evidence of tricuspid stenosis. Aortic Valve: The aortic valve has an indeterminant number of cusps. . There is mild thickening and mild calcification of the  aortic valve. Aortic valve regurgitation is not visualized. No aortic stenosis is present. Mild aortic valve annular calcification. There is mild thickening of the aortic valve. There is mild calcification of the aortic valve. Aortic valve mean gradient measures 3.4 mmHg. Aortic valve peak gradient measures 6.7 mmHg. Aortic valve area, by VTI measures 1.41 cm. Pulmonic Valve: The pulmonic valve was not well visualized. Pulmonic valve regurgitation is not visualized. No evidence of pulmonic stenosis. Aorta: The aortic root is normal in size and structure. Pulmonary Artery: Indeterminant PASP, inadequate TR jet. Venous: The inferior vena cava is normal in size with greater than 50% respiratory variability, suggesting right atrial pressure of 3 mmHg. IAS/Shunts: No atrial level shunt detected by color flow Doppler.  LEFT VENTRICLE PLAX 2D LVOT diam:     2.10 cm  Diastology LV SV:         36       LV e' lateral:   8.18 cm/s LV SV Index:   15       LV E/e' lateral: 12.2 LVOT Area:     3.46 cm LV e' medial:    8.48 cm/s                         LV E/e' medial:  11.8  RIGHT VENTRICLE RV S prime:     8.92 cm/s TAPSE (M-mode): 1.4 cm LEFT ATRIUM              Index       RIGHT ATRIUM           Index LA Vol (A2C):   93.6 ml  38.38 ml/m RA Area:     18.40 cm LA Vol (A4C):   126.0 ml 51.67 ml/m RA Volume:   49.80 ml  20.42 ml/m LA Biplane Vol: 109.0 ml 44.70 ml/m  AORTIC VALVE AV Area (Vmax):    1.70 cm AV Area (Vmean):   1.60 cm AV Area (VTI):     1.41 cm AV Vmax:           129.71 cm/s AV Vmean:          85.586 cm/s AV VTI:            0.256 m AV Peak Grad:      6.7 mmHg AV Mean Grad:      3.4 mmHg LVOT Vmax:         63.54 cm/s LVOT Vmean:        39.503 cm/s LVOT VTI:          0.104 m LVOT/AV VTI ratio: 0.41 MITRAL VALVE               TRICUSPID VALVE MV Area (PHT): 4.49 cm    TR Peak grad:   28.7 mmHg MV Decel Time: 169 msec    TR Vmax:        268.00 cm/s MV E velocity: 99.90 cm/s MV A velocity: 21.30 cm/s  SHUNTS MV  E/A ratio:  4.69        Systemic VTI:  0.10 m                            Systemic Diam: 2.10 cm Carlyle Dolly MD Electronically  signed by Carlyle Dolly MD Signature Date/Time: 06/26/2019/12:47:29 PM    Final     Scheduled Meds: . apixaban  5 mg Oral BID  . budesonide (PULMICORT) nebulizer solution  0.5 mg Nebulization BID  . Chlorhexidine Gluconate Cloth  6 each Topical Q0600  . furosemide  40 mg Intravenous BID  . insulin aspart  0-15 Units Subcutaneous TID WC  . insulin aspart  0-5 Units Subcutaneous QHS  . insulin aspart  15 Units Subcutaneous TID WC  . insulin NPH Human  60 Units Subcutaneous BID AC  . ipratropium-albuterol  3 mL Nebulization Q6H  . mouth rinse  15 mL Mouth Rinse BID  . metoprolol tartrate  125 mg Oral BID  . nystatin   Topical BID  . PARoxetine  20 mg Oral Daily  . simvastatin  40 mg Oral Daily  . sodium chloride flush  3 mL Intravenous Q12H   Continuous Infusions: . sodium chloride 250 mL (06/27/19 0848)  .  ceFAZolin (ANCEF) IV 2 g (06/27/19 0849)     LOS: 2 days    Time spent: 30 minutes.   Barton Dubois, MD [facility] Pager [phone]   06/27/2019, 4:22 PM

## 2019-06-28 LAB — BASIC METABOLIC PANEL
Anion gap: 11 (ref 5–15)
BUN: 35 mg/dL — ABNORMAL HIGH (ref 8–23)
CO2: 29 mmol/L (ref 22–32)
Calcium: 8.6 mg/dL — ABNORMAL LOW (ref 8.9–10.3)
Chloride: 92 mmol/L — ABNORMAL LOW (ref 98–111)
Creatinine, Ser: 1.3 mg/dL — ABNORMAL HIGH (ref 0.61–1.24)
GFR calc Af Amer: >60 mL/min (ref >60)
GFR calc non Af Amer: 56 mL/min — ABNORMAL LOW (ref >60)
Glucose, Bld: 300 mg/dL — ABNORMAL HIGH (ref 70–99)
Potassium: 4 mmol/L (ref 3.5–5.1)
Sodium: 132 mmol/L — ABNORMAL LOW (ref 135–145)

## 2019-06-28 LAB — CULTURE, BLOOD (ROUTINE X 2): Special Requests: ADEQUATE

## 2019-06-28 LAB — GLUCOSE, CAPILLARY
Glucose-Capillary: 211 mg/dL — ABNORMAL HIGH (ref 70–99)
Glucose-Capillary: 356 mg/dL — ABNORMAL HIGH (ref 70–99)

## 2019-06-28 MED ORDER — CEPHALEXIN 500 MG PO CAPS: 500.0000 mg | ORAL_CAPSULE | Freq: Three times a day (TID) | ORAL | 0 refills | Status: DC

## 2019-06-28 MED ORDER — NYSTATIN 100000 UNIT/GM EX POWD: Freq: Two times a day (BID) | CUTANEOUS | 0 refills | Status: AC

## 2019-06-28 NOTE — Progress Notes (Signed)
Nsg Discharge Note  Admit Date:  06/25/2019 Discharge date: 06/28/2019   Justin Ruiz to be D/C'd home per MD order.  AVS completed.  Copy for chart, and copy for patient signed, and dated. Patient/caregiver able to verbalize understanding.  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   albuterol 108 (90 Base) MCG/ACT inhaler Commonly known as: VENTOLIN HFA Inhale 2 puffs into the lungs every 6 (six) hours as needed for wheezing or shortness of breath.   ALPRAZolam 0.25 MG tablet Commonly known as: XANAX Take 1 tablet (0.25 mg total) by mouth 3 (three) times daily as needed. for anxiety   B-D SINGLE USE SWABS REGULAR Pads Test BS QID Dx E11.9   cephALEXin 500 MG capsule Commonly known as: KEFLEX Take 1 capsule (500 mg total) by mouth 3 (three) times daily for 7 days.   Eliquis 5 MG Tabs tablet Generic drug: apixaban Take 1 tablet by mouth twice daily   furosemide 40 MG tablet Commonly known as: LASIX TAKE 1 TABLET BY MOUTH TWICE DAILY AS DIRECTED   insulin NPH Human 100 UNIT/ML injection Commonly known as: NovoLIN N ReliOn Inject 0.55 mLs (55 Units total) into the skin 2 (two) times daily before a meal.   insulin regular 100 units/mL injection Commonly known as: NOVOLIN R Inject 15 units before breakfast and supper   ipratropium-albuterol 0.5-2.5 (3)  MG/3ML Soln Commonly known as: DUONEB Take 3 mLs by nebulization every 6 (six) hours as needed.   Lancing Device Misc USE AS DIRECTED   metoprolol tartrate 100 MG tablet Commonly known as: LOPRESSOR Take 1 tablet by mouth twice daily   mometasone-formoterol 200-5 MCG/ACT Aero Commonly known as: DULERA Inhale 2 puffs into the lungs 2 (two) times daily.   nystatin powder Commonly known as: MYCOSTATIN/NYSTOP Apply topically 2 (two) times daily. Apply to groin area and scrotum skin; keep area clean and dry.   Ozempic (1 MG/DOSE) 2 MG/1.5ML Sopn Generic drug: Semaglutide (1 MG/DOSE) Inject 1 mg into the skin once a week.   PARoxetine 20 MG tablet Commonly known as: PAXIL Take 1 tablet by mouth once daily   simvastatin 40 MG tablet Commonly known as: ZOCOR Take 1 tablet by mouth once daily   Spiriva Respimat 2.5 MCG/ACT Aers Generic drug: Tiotropium Bromide Monohydrate Inhale 1 puff into the lungs daily.   Vitamin D3 125 MCG (5000 UT) Tabs Take 1 tablet by mouth every morning.       Discharge Assessment: Vitals:   06/28/19 1300 06/28/19 1338  BP: 105/69   Pulse: 80   Resp: 18   Temp: 98.3 F (36.8 C)   SpO2: 97% 95%   Skin clean, dry and intact without evidence of skin break down, no evidence of skin tears noted. IV catheter discontinued intact.  Site without signs and symptoms of complications - no redness or edema noted at insertion site, patient denies c/o pain - only slight tenderness at site.  Dressing with slight pressure applied.  D/c Instructions-Education: Discharge instructions given to patient/family with verbalized understanding. D/c education completed with patient/family including follow up instructions, medication list, d/c activities limitations if indicated, with other d/c instructions as indicated by MD - patient able to verbalize understanding, all questions fully answered. Patient instructed to return to ED, call 911, or call MD for any changes in  condition.  Patient escorted via [name], and D/C home via private auto.  Zachery Conch, RN 06/28/2019 3:11 PM

## 2019-06-28 NOTE — Plan of Care (Signed)

## 2019-06-28 NOTE — Care Management Important Message (Signed)
Important Message  Patient Details  Name: Justin Ruiz MRN: [id number] Date of Birth: May 16, 1950   Medicare Important Message Given:  Yes     Tommy Medal 06/28/2019, 1:32 PM

## 2019-06-28 NOTE — Discharge Summary (Signed)
Physician Discharge Summary  Justin Ruiz ZOX:[id number] DOB: Dec 23, 1950 DOA: 06/25/2019  PCP: [name], MD  Admit date: 06/25/2019 Discharge date: 06/28/2019  Time spent: 35 minutes  Recommendations for Outpatient Follow-up:  1. Reassess volume status and adjust diuretic regimen 2. Repeat BMET to follow electrolytes and renal function 3. -reassess BP and adjust antihypertensive regimen as needed 4. Close monitoring of CBG's fluctuation and further adjustment to hypoglycemic regimen as required.    Discharge Diagnoses:  Active Problems:   Obesity, morbid (HCC)   Atrial fibrillation with rapid ventricular response (HCC)   Acute on chronic respiratory failure (HCC)   Acute on chronic diastolic CHF (congestive heart failure) (HCC)   Atrial fibrillation, chronic ([place])   Uncontrolled type 2 diabetes mellitus with hyperglycemia ([place])   Discharge Condition: stable and improved. Instructed to follow up with PCP in 10 days.  Code status: full code.  Diet recommendation: heart healthy, low calorie and modified carb diet.  Filed Weights   06/25/19 1720 06/26/19 0500 06/27/19 0431  Weight: 134 kg 134 kg 133.9 kg    History of present illness:  As per H&P written by Dr. Carles Collet on 06/25/2019 68 y.o.malewith medical history ofdiastolic CHF, diabetes mellitus type 2, MGUS, chronic respiratory failure on 3 L, hypertension, hyperlipidemia, non-Hodgkin's lymphoma presenting with 2-day history of shortness of breath. The patient denied any fevers, chills, chest pain, nausea, vomiting, diarrhea, abdominal pain. He complains of orthopnea type symptoms. He has to sleep in a recliner for the past 2 months. He has dyspnea on exertion. He states that his lower extremity edema is about the same as usual. He quit smoking 5 years ago after 40-pack-year history. He has been drinking 5 x20 ounce bottles of water on a daily basis, and complains of polydipsia. His wife has been out of town for the past  month visiting relatives in [place]. As result, the patient has been living alone with the help with his neighbor. The patient endorsed compliance with all his medications, but he seems to have very poor insight regarding his medications when asked specifics about dosing and frequency. Because of progressive shortness of breath, he activated EMS. He was noted to have oxygen saturation on 80% on 3 L. CBG was 498. Heart rate was in the 170s. In the emergency department, the patient was tachycardic with heart rate in the 130s. He had low-grade temperature of 99.0 F. He was hemodynamically stable. Oxygen saturation was 96% on 4 L.  Hospital Course:  1-acute on chronic respiratory failure with hypoxia -Patient chronically uses 3 L nasal cannula supplementation and on presentation was requiring around 4 L to keep O2 sats above 92%. Back to baseline supplementation at discharge.  -Denies any chest pain -Vascular congestion, elevated BNP and fluid overload appreciated on exam. -significant improved after IV diuresis; will discharge home with resumption of lasix 53m BID, instructions for low sodium diet and daily weight checks.  -Diet indiscretion most likely as a big factor for his exacerbation. -Continue to follow renal function and electrolytes at follow up visit.   2-A. fib with RVR -Started on Cardizem drip and subsequently discontinue after achieving rate control and experiencing soft blood pressure. -Beta-blocker dosage has been adjusted and rate remained well controlle dat discharge. -Blood pressure so far stable and tolerating current treatment. -Continue apixaban.  3-acute on chronic diastolic heart failure -Echo demonstrating preserved ejection fraction and no wall motion normalities. -resume oral diuretics -patient advise to follow low sodium diet, daily weights and to be  compliant with medications.  -Continue beta-blocker management. -BP and soft and AKI on presentation,  holding on ARB/ACE currently. EF > 40% anyway.   4-lower extremity cellulitis (left more than right) -No active drainage appreciated. -will complete abx therapy with the use of keflex.   5-positive blood cultures for Gram positive cocci (anaerobes anaerobic's bottles) -No fever, normal WBCs and no findings of systemic infection currently -Empirically receiving treatment with Ancef for lower extremity cellulitis -culture speciation and results suggesting contaminant.   6-morbid obesity -Body mass index is 43.59 kg/m. -Low calorie diet, portion control and increase physical activity discussed with patient.  7-uncontrolled and poorly controlled type 2 diabetes with hyperglycemia -A1c 9.1 -Continue adjusted dose of NPH, modified carb diet encouraged -resume the use of ozempic -follow up with PCP to further adjust hypoglycemic agents -Metformin hold at discharge given AKI and Cr at borderline range for safety; can be resumed at follow up visit if renal function further improved,/stabilizes.    8-essential hypertension -stable and well controlled at discharge, resume home antihypertensive agents -follow heart healthy diet.  9-acute kidney injury -In the setting of acute diuresis as of blood pressure after receiving treatment for A. fib with RVR. -Diuretics dosage has been adjusted -repeat BMEt at follow up visit -minimize nephrotoxic agents.   10-COPD -Continue DuoNeb and Pulmicort -no wheezing at discharge -Patient reports breathing is back to baseline. -not need for steroids at this time.  Procedures:  See below for x-ray reports   2D echo:  1. Left ventricular ejection fraction, by estimation, is 55 to 60%. The  left ventricle has normal function. The left ventricle has no regional  wall motion abnormalities. There is mild left ventricular hypertrophy.  Left ventricular diastolic parameters  are indeterminate.  2. Right ventricular systolic function is mildly  reduced. The right  ventricular size is mildly enlarged. There is mildly elevated pulmonary  artery systolic pressure.  3. Left atrial size was severely dilated.  4. The mitral valve is normal in structure and function. No evidence of  mitral valve regurgitation. No evidence of mitral stenosis.  5. The aortic valve has an indeterminant number of cusps. Aortic valve  regurgitation is not visualized. No aortic stenosis is present.  6. The inferior vena cava is normal in size with greater than 50%  respiratory variability, suggesting right atrial pressure of 3 mmHg.   Consultations:  None   Discharge Exam: Vitals:   06/28/19 0847 06/28/19 0954  BP:  105/69  Pulse:  80  Resp:  18  Temp:    SpO2: 96% 97%    General: afebrile, no CP, no nausea, no vomiting and denying orthopnea currently. Feeling good and ready to go home.  Cardiovascular: rate controlled, no rubs, no gallops, unable to assess JVD with body habitus. Respiratory: improved air movement bilaterally, no wheezing, no frank crackles, decrease BS at the bases, no rhonchi. Abd: obese, NT, ND, positive bowel sounds. Extremities: no cyanosis, no clubbing, significant improvement in swelling and erythema, no drainage.    Discharge Instructions   Discharge Instructions    (HEART FAILURE PATIENTS) Call MD:  Anytime you have any of the following symptoms: 1) 3 pound weight gain in 24 hours or 5 pounds in 1 week 2) shortness of breath, with or without a dry hacking cough 3) swelling in the hands, feet or stomach 4) if you have to sleep on extra pillows at night in order to breathe.   Complete by: As directed    Diet -  low sodium heart healthy   Complete by: As directed    Discharge instructions   Complete by: As directed    Take medications as prescribed Follow low sodium diet (< 2-2.5 gram sodium daily) Check weight on daily basis Follow up with PCP in 10 days Maintain adequate hydration     Allergies as of 06/28/2019       Reactions   Avapro [irbesartan] Other (See Comments)   "doesnt sit right with me"--light headed   Lipitor [atorvastatin Calcium] Other (See Comments)   Leg pain      Medication List    STOP taking these medications   metFORMIN 500 MG tablet Commonly known as: GLUCOPHAGE     TAKE these medications   Accu-Chek Aviva Plus test strip Generic drug: glucose blood Test BS QID Dx E11.9   Accu-Chek Aviva Plus w/Device Kit Test BS QID DX E11.9   Accu-Chek FastClix Lancets Misc Test BS QID Dx E11.9   acetaminophen 325 MG tablet Commonly known as: TYLENOL Take 650 mg by mouth every 6 (six) hours as needed.   albuterol 108 (90 Base) MCG/ACT inhaler Commonly known as: VENTOLIN HFA Inhale 2 puffs into the lungs every 6 (six) hours as needed for wheezing or shortness of breath.   ALPRAZolam 0.25 MG tablet Commonly known as: XANAX Take 1 tablet (0.25 mg total) by mouth 3 (three) times daily as needed. for anxiety   B-D SINGLE USE SWABS REGULAR Pads Test BS QID Dx E11.9   cephALEXin 500 MG capsule Commonly known as: KEFLEX Take 1 capsule (500 mg total) by mouth 3 (three) times daily for 7 days.   Eliquis 5 MG Tabs tablet Generic drug: apixaban Take 1 tablet by mouth twice daily   furosemide 40 MG tablet Commonly known as: LASIX TAKE 1 TABLET BY MOUTH TWICE DAILY AS DIRECTED   insulin NPH Human 100 UNIT/ML injection Commonly known as: NovoLIN N ReliOn Inject 0.55 mLs (55 Units total) into the skin 2 (two) times daily before a meal.   insulin regular 100 units/mL injection Commonly known as: NOVOLIN R Inject 15 units before breakfast and supper   ipratropium-albuterol 0.5-2.5 (3) MG/3ML Soln Commonly known as: DUONEB Take 3 mLs by nebulization every 6 (six) hours as needed.   Lancing Device Misc USE AS DIRECTED   metoprolol tartrate 100 MG tablet Commonly known as: LOPRESSOR Take 1 tablet by mouth twice daily   mometasone-formoterol 200-5 MCG/ACT Aero Commonly  known as: DULERA Inhale 2 puffs into the lungs 2 (two) times daily.   nystatin powder Commonly known as: MYCOSTATIN/NYSTOP Apply topically 2 (two) times daily. Apply to groin area and scrotum skin; keep area clean and dry.   Ozempic (1 MG/DOSE) 2 MG/1.5ML Sopn Generic drug: Semaglutide (1 MG/DOSE) Inject 1 mg into the skin once a week.   PARoxetine 20 MG tablet Commonly known as: PAXIL Take 1 tablet by mouth once daily   simvastatin 40 MG tablet Commonly known as: ZOCOR Take 1 tablet by mouth once daily   Spiriva Respimat 2.5 MCG/ACT Aers Generic drug: Tiotropium Bromide Monohydrate Inhale 1 puff into the lungs daily.   Vitamin D3 125 MCG (5000 UT) Tabs Take 1 tablet by mouth every morning.      Allergies  Allergen Reactions  . Avapro [Irbesartan] Other (See Comments)    "doesnt sit right with me"--light headed  . Lipitor [Atorvastatin Calcium] Other (See Comments)    Leg pain   Follow-up Information    [name], MD. Schedule  an appointment as soon as possible for a visit in 10 day(s).   Specialty: Family Medicine Contact information: [place]  [phone]           The results of significant diagnostics from this hospitalization (including imaging, microbiology, ancillary and laboratory) are listed below for reference.    Significant Diagnostic Studies: DG Chest Port 1 View  Result Date: 06/25/2019 CLINICAL DATA:  Shortness of breath EXAM: PORTABLE CHEST 1 VIEW COMPARISON:  11/01/2018 FINDINGS: The heart size and mediastinal contours are stable. Calcific aortic knob. Mild pulmonary vascular congestion. Minimal linear scarring versus atelectasis in the left lung base. No focal airspace consolidation, pleural effusion, or pneumothorax. The visualized skeletal structures are unremarkable. IMPRESSION: Mild pulmonary vascular congestion. Electronically Signed   By: Davina Poke D.O.   On: 06/25/2019 11:53   ECHOCARDIOGRAM  COMPLETE  Result Date: 06/26/2019    ECHOCARDIOGRAM REPORT   Patient Name:   NYSIR FERGUSSON Date of Exam: 06/26/2019 Medical Rec #:  [id number]      Height:       69.0 in Accession #:    [id number]     Weight:       295.4 lb Date of Birth:  Oct 19, 1950      BSA:          2.439 m Patient Age:    67 years       BP:           142/82 mmHg Patient Gender: M              HR:           106 bpm. Exam Location:  [facility] Procedure: 2D Echo Indications:    CHF-Acute Diastolic 762.83 / T51.76  History:        Patient has prior history of Echocardiogram examinations, most                 recent 03/29/2016. CHF, COPD, Arrythmias:Atrial Fibrillation;                 Risk Factors:Diabetes, Dyslipidemia, Hypertension and Former                 Smoker.  Sonographer:    Leavy Cella RDCS (AE) Referring Phys: [phone] DAVID TAT IMPRESSIONS  1. Left ventricular ejection fraction, by estimation, is 55 to 60%. The left ventricle has normal function. The left ventricle has no regional wall motion abnormalities. There is mild left ventricular hypertrophy. Left ventricular diastolic parameters are indeterminate.  2. Right ventricular systolic function is mildly reduced. The right ventricular size is mildly enlarged. There is mildly elevated pulmonary artery systolic pressure.  3. Left atrial size was severely dilated.  4. The mitral valve is normal in structure and function. No evidence of mitral valve regurgitation. No evidence of mitral stenosis.  5. The aortic valve has an indeterminant number of cusps. Aortic valve regurgitation is not visualized. No aortic stenosis is present.  6. The inferior vena cava is normal in size with greater than 50% respiratory variability, suggesting right atrial pressure of 3 mmHg. FINDINGS  Left Ventricle: Left ventricular ejection fraction, by estimation, is 55 to 60%. The left ventricle has normal function. The left ventricle has no regional wall motion abnormalities. The left ventricular internal cavity  size was small. There is mild left ventricular hypertrophy. Left ventricular diastolic parameters are indeterminate. Right Ventricle: The right ventricular size is mildly enlarged. Right vetricular wall thickness was not assessed.  Right ventricular systolic function is mildly reduced. There is mildly elevated pulmonary artery systolic pressure. The tricuspid regurgitant velocity is 2.68 m/s, and with an assumed right atrial pressure of 10 mmHg, the estimated right ventricular systolic pressure is 69.4 mmHg. Left Atrium: Left atrial size was severely dilated. Right Atrium: Right atrial size was not well visualized. Pericardium: A small pericardial effusion is present. The pericardial effusion is circumferential. Mitral Valve: The mitral valve is normal in structure and function. No evidence of mitral valve regurgitation. No evidence of mitral valve stenosis. Tricuspid Valve: The tricuspid valve is normal in structure. Tricuspid valve regurgitation is trivial. No evidence of tricuspid stenosis. Aortic Valve: The aortic valve has an indeterminant number of cusps. . There is mild thickening and mild calcification of the aortic valve. Aortic valve regurgitation is not visualized. No aortic stenosis is present. Mild aortic valve annular calcification. There is mild thickening of the aortic valve. There is mild calcification of the aortic valve. Aortic valve mean gradient measures 3.4 mmHg. Aortic valve peak gradient measures 6.7 mmHg. Aortic valve area, by VTI measures 1.41 cm. Pulmonic Valve: The pulmonic valve was not well visualized. Pulmonic valve regurgitation is not visualized. No evidence of pulmonic stenosis. Aorta: The aortic root is normal in size and structure. Pulmonary Artery: Indeterminant PASP, inadequate TR jet. Venous: The inferior vena cava is normal in size with greater than 50% respiratory variability, suggesting right atrial pressure of 3 mmHg. IAS/Shunts: No atrial level shunt detected by color flow  Doppler.  LEFT VENTRICLE PLAX 2D LVOT diam:     2.10 cm  Diastology LV SV:         36       LV e' lateral:   8.18 cm/s LV SV Index:   15       LV E/e' lateral: 12.2 LVOT Area:     3.46 cm LV e' medial:    8.48 cm/s                         LV E/e' medial:  11.8  RIGHT VENTRICLE RV S prime:     8.92 cm/s TAPSE (M-mode): 1.4 cm LEFT ATRIUM              Index       RIGHT ATRIUM           Index LA Vol (A2C):   93.6 ml  38.38 ml/m RA Area:     18.40 cm LA Vol (A4C):   126.0 ml 51.67 ml/m RA Volume:   49.80 ml  20.42 ml/m LA Biplane Vol: 109.0 ml 44.70 ml/m  AORTIC VALVE AV Area (Vmax):    1.70 cm AV Area (Vmean):   1.60 cm AV Area (VTI):     1.41 cm AV Vmax:           129.71 cm/s AV Vmean:          85.586 cm/s AV VTI:            0.256 m AV Peak Grad:      6.7 mmHg AV Mean Grad:      3.4 mmHg LVOT Vmax:         63.54 cm/s LVOT Vmean:        39.503 cm/s LVOT VTI:          0.104 m LVOT/AV VTI ratio: 0.41 MITRAL VALVE               TRICUSPID VALVE  MV Area (PHT): 4.49 cm    TR Peak grad:   28.7 mmHg MV Decel Time: 169 msec    TR Vmax:        268.00 cm/s MV E velocity: 99.90 cm/s MV A velocity: 21.30 cm/s  SHUNTS MV E/A ratio:  4.69        Systemic VTI:  0.10 m                            Systemic Diam: 2.10 cm Carlyle Dolly MD Electronically signed by Carlyle Dolly MD Signature Date/Time: 06/26/2019/12:47:29 PM    Final     Microbiology: Recent Results (from the past 240 hour(s))  Culture, blood (routine x 2)     Status: Abnormal   Collection Time: 06/25/19 11:20 AM   Specimen: BLOOD LEFT ARM  Result Value Ref Range Status   Specimen Description   Final    BLOOD LEFT ARM Performed at [facility], [address], [place], [place] [address]    Special Requests   Final    BOTTLES DRAWN AEROBIC AND ANAEROBIC Blood Culture adequate volume Performed at [facility], [address] [place], [place] [address]    Culture  Setup Time   Final    GRAM POSITIVE COCCI AEROBIC AND ANAEROBIC BOTTLES CALLED TO S  LASHLEY AT 0840 BY HFLYNT 06/26/19 PERFORMED AT [facility] Performed at [facility], [address], [place], [place] [address]    Culture (A)  Final    STAPHYLOCOCCUS SPECIES (COAGULASE NEGATIVE) THE SIGNIFICANCE OF ISOLATING THIS ORGANISM FROM A SINGLE SET OF BLOOD CULTURES WHEN MULTIPLE SETS ARE DRAWN IS UNCERTAIN. PLEASE NOTIFY THE MICROBIOLOGY DEPARTMENT WITHIN ONE WEEK IF SPECIATION AND SENSITIVITIES ARE REQUIRED. Performed at [facility], [place] [address], [place], [place] [address]    Report Status 06/28/2019 FINAL  Final  Culture, blood (routine x 2)     Status: None (Preliminary result)   Collection Time: 06/25/19 11:59 AM   Specimen: BLOOD RIGHT ARM  Result Value Ref Range Status   Specimen Description BLOOD RIGHT ARM  Final   Special Requests   Final    BOTTLES DRAWN AEROBIC AND ANAEROBIC Blood Culture adequate volume   Culture   Final    NO GROWTH 3 DAYS Performed at [facility], [address] [place], [place] [address]    Report Status PENDING  Incomplete  Respiratory Panel by RT PCR (Flu A&B, Covid) - Nasopharyngeal Swab     Status: None   Collection Time: 06/25/19  3:14 PM   Specimen: Nasopharyngeal Swab  Result Value Ref Range Status   SARS Coronavirus 2 by RT PCR NEGATIVE NEGATIVE Final    Comment: (NOTE) SARS-CoV-2 target nucleic acids are NOT DETECTED. The SARS-CoV-2 RNA is generally detectable in upper respiratoy specimens during the acute phase of infection. The lowest concentration of SARS-CoV-2 viral copies this assay can detect is 131 copies/mL. A negative result does not preclude SARS-Cov-2 infection and should not be used as the sole basis for treatment or other patient management decisions. A negative result may occur with  improper specimen collection/handling, submission of specimen other than nasopharyngeal swab, presence of viral mutation(s) within the areas targeted by this assay, and inadequate number of viral copies (<131 copies/mL). A negative  result must be combined with clinical observations, patient history, and epidemiological information. The expected result is Negative. Fact Sheet for Patients:  PinkCheek.be Fact Sheet for Healthcare Providers:  GravelBags.it This  test is not yet ap proved or cleared by the [place] and  has been authorized for detection and/or diagnosis of SARS-CoV-2 by FDA under an Emergency Use Authorization (EUA). This EUA will remain  in effect (meaning this test can be used) for the duration of the COVID-19 declaration under Section 564(b)(1) of the Act, 21 U.S.C. section 360bbb-3(b)(1), unless the authorization is terminated or revoked sooner.    Influenza A by PCR NEGATIVE NEGATIVE Final   Influenza B by PCR NEGATIVE NEGATIVE Final    Comment: (NOTE) The Xpert Xpress SARS-CoV-2/FLU/RSV assay is intended as an aid in  the diagnosis of influenza from Nasopharyngeal swab specimens and  should not be used as a sole basis for treatment. Nasal washings and  aspirates are unacceptable for Xpert Xpress SARS-CoV-2/FLU/RSV  testing. Fact Sheet for Patients: PinkCheek.be Fact Sheet for Healthcare Providers: GravelBags.it This test is not yet approved or cleared by the [place] FDA and  has been authorized for detection and/or diagnosis of SARS-CoV-2 by  FDA under an Emergency Use Authorization (EUA). This EUA will remain  in effect (meaning this test can be used) for the duration of the  Covid-19 declaration under Section 564(b)(1) of the Act, 21  U.S.C. section 360bbb-3(b)(1), unless the authorization is  terminated or revoked. Performed at [facility], [address], [place], [place] [address]   MRSA PCR Screening     Status: None   Collection Time: 06/25/19  5:18 PM   Specimen: Nasal Mucosa; Nasopharyngeal  Result Value Ref Range Status   MRSA by PCR NEGATIVE  NEGATIVE Final    Comment:        The GeneXpert MRSA Assay (FDA approved for NASAL specimens only), is one component of a comprehensive MRSA colonization surveillance program. It is not intended to diagnose MRSA infection nor to guide or monitor treatment for MRSA infections. Performed at [facility], [address] [place], [place] [address]      Labs: Basic Metabolic Panel: Recent Labs  Lab 06/25/19 1120 06/25/19 1408 06/26/19 0503 06/27/19 0407 06/28/19 0404  NA 133*  --  126* 132* 132*  K 4.4  --  4.4 3.5 4.0  CL 96*  --  89* 93* 92*  CO2 27  --  '26 27 29  ' GLUCOSE 406*  --  459* 161* 300*  BUN 20  --  29* 37* 35*  CREATININE 1.15  --  1.97* 1.63* 1.30*  CALCIUM 8.9  --  8.7* 8.7* 8.6*  MG  --  1.8  --   --   --    Liver Function Tests: Recent Labs  Lab 06/25/19 1120  AST 15  ALT 16  ALKPHOS 64  BILITOT 1.6*  PROT 7.3  ALBUMIN 3.2*   CBC: Recent Labs  Lab 06/25/19 1120 06/26/19 0503  WBC 10.5 9.4  NEUTROABS 8.7*  --   HGB 12.4* 12.6*  HCT 38.6* 38.5*  MCV 94.1 93.0  PLT 238 239    BNP (last 3 results) Recent Labs    06/25/19 1120  BNP 1,520.0*    CBG: Recent Labs  Lab 06/27/19 1137 06/27/19 1640 06/27/19 2004 06/28/19 0741 06/28/19 1059  GLUCAP 326* 254* 341* 211* 356*    Signed:  Barton Dubois MD.  [facility] 06/28/2019, 1:16 PM

## 2019-06-28 NOTE — Progress Notes (Signed)
Inpatient Diabetes Program Recommendations  AACE/ADA: New Consensus Statement on Inpatient Glycemic Control (2015)  Target Ranges:  Prepandial:   less than 140 mg/dL      Peak postprandial:   less than 180 mg/dL (1-2 hours)      Critically ill patients:  140 - 180 mg/dL   Lab Results  Component Value Date   GLUCAP 356 (H) 06/28/2019   HGBA1C 9.1 (H) 06/25/2019    Review of Glycemic Control Results for Justin Ruiz, Justin Ruiz (MRN [id number]) as of 06/28/2019 13:15  Ref. Range 06/27/2019 11:37 06/27/2019 16:40 06/27/2019 20:04 06/28/2019 07:41 06/28/2019 10:59  Glucose-Capillary Latest Ref Range: 70 - 99 mg/dL 326 (H) 254 (H) 341 (H) 211 (H) 356 (H)  Diabetes history:DM2 Outpatient Diabetes medications:Novolin N 55 units bid + Novolog R 15 units bid Current orders for Inpatient glycemic control:NPH insulin 60 units + Novolog moderate correction tid + hs 0-5 units + Novolog 15 units tid with meals (hold if patient eats less than 50%) Inpatient Diabetes Program Recommendations:   Consider increasing Novolog meal coverage to 18 units tid with meals.   Thanks  Adah Perl, RN, BC-ADM Inpatient Diabetes Coordinator Pager [phone] (8a-5p)

## 2019-06-28 NOTE — TOC Transition Note (Signed)
Transition of Care [facility]) - CM/SW Discharge Note   Patient Details  Name: Justin Ruiz MRN: [id number] Date of Birth: 1950/06/22  Transition of Care [facility]) CM/SW Contact:  Shade Flood, LCSW Phone Number: 06/28/2019, 11:57 AM   Clinical Narrative:     Pt admitted from home. PT and OT recommending HH therapy at dc. Pt would also benefit from [facility] RN per MD. Damaris Schooner with pt today to assess for TOC needs. Pt plans to return home at dc. He is independent in ADLs at baseline. He is able to obtain medications and get to his appointments. Pt agreeable to [facility]. Discussed provider options. Referred to [facility]. There are no other TOC needs for dc.  Expected Discharge Plan: [place] Barriers to Discharge: Barriers Resolved   Patient Goals and CMS Choice Patient states their goals for this hospitalization and ongoing recovery are:: return home CMS Medicare.gov Compare Post Acute Care list provided to:: Patient Choice offered to / list presented to : Patient  Expected Discharge Plan and Services Expected Discharge Plan: [place] In-house Referral: Clinical Social Work   Post Acute Care Choice: March ARB arrangements for the past 2 months: [place]: RN, PT, OT [place] Agency: [facility] ([facility] [employer]) Date [place]: 06/28/19   Representative spoke with at [place]: Tim  Prior Living Arrangements/Services Living arrangements for the past 2 months: [place] Lives with:: Spouse Patient language and need for interpreter reviewed:: Yes Do you feel safe going back to the place where you live?: Yes      Need for Family Participation in Patient Care: No (Comment) Care giver support system in place?: Yes (comment)   Criminal Activity/Legal Involvement Pertinent to Current Situation/Hospitalization: No - Comment as needed  Activities of Daily Living Home  Assistive Devices/Equipment: Dentures (specify type)(lower dentures) ADL Screening (condition at time of admission) Patient's cognitive ability adequate to safely complete daily activities?: Yes Is the patient deaf or have difficulty hearing?: No Does the patient have difficulty seeing, even when wearing glasses/contacts?: No Does the patient have difficulty concentrating, remembering, or making decisions?: Yes Patient able to express need for assistance with ADLs?: Yes Does the patient have difficulty dressing or bathing?: No Independently performs ADLs?: Yes (appropriate for developmental age) Does the patient have difficulty walking or climbing stairs?: Yes Weakness of Legs: Both Weakness of Arms/Hands: None  Permission Sought/Granted Permission sought to share information with : Chartered certified accountant granted to share information with : Yes, Verbal Permission Granted     Permission granted to share info w AGENCY: [facility]        Emotional Assessment Appearance:: Appears younger than stated age Attitude/Demeanor/Rapport: Engaged Affect (typically observed): Pleasant Orientation: : Oriented to Self, Oriented to Place, Oriented to  Time, Oriented to Situation Alcohol / Substance Use: Not Applicable Psych Involvement: No (comment)  Admission diagnosis:  Hyperglycemia [R73.9] General weakness [R53.1] Atrial fibrillation with rapid ventricular response (HCC) [I48.91] Acute on chronic respiratory failure (HCC) [J96.20] Acute on chronic congestive heart failure, unspecified heart failure type ([place]) [I50.9] Patient Active Problem List   Diagnosis Date Noted  . Acute on chronic respiratory failure ([place]) 06/25/2019  . Acute on chronic diastolic CHF (congestive heart failure) ([place]) 06/25/2019  . Atrial fibrillation, chronic ([place]) 06/25/2019  .  Uncontrolled type 2 diabetes mellitus with hyperglycemia ([place]) 06/25/2019  . General weakness   . Diabetic infection of  right foot ([place]) 12/17/2018  . Chronic respiratory failure with hypoxia ([place]) 11/10/2017  . Peripheral vascular insufficiency ([place]) 08/02/2017  . Diabetic retinopathy ([place]) 05/28/2017  . Thrombocytopenia ([place]) 11/23/2016  . Memory loss 04/13/2016  . Hypomagnesemia   . Acute diastolic heart failure ([place]) 03/28/2016  . COPD mixed type ([place]) 03/28/2016  . 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 Chronic diastolic heart failure ([place])   . Diabetic neuropathy, type II diabetes mellitus ([place]) 09/25/2014  . Obstructive sleep apnea 05/25/2014  . Benign neoplasm of ascending colon 05/05/2014  . Excessive daytime sleepiness 01/24/2014  . Mediastinal adenopathy 06/20/2013  . Generalized anxiety disorder 05/01/2013  . Lung nodules 03/11/2013  . NHL (non-Hodgkin's lymphoma) ([place]) 01/26/2010  . Obesity, morbid ([place]) 04/27/2009  . ABNORMAL STRESS ELECTROCARDIOGRAM 03/25/2009  . Diabetes mellitus type 2, insulin dependent ([place]) 04/29/2007  . Hyperlipidemia 04/29/2007  . Hypertension 04/29/2007  . Seasonal and perennial allergic rhinitis 04/29/2007  . G E R D 04/29/2007  . BENIGN PROSTATIC HYPERTROPHY, HX OF 04/29/2007   PCP:  [name], MD Pharmacy:   [facility] [address], [place] [address] [place] [address] Phone: [phone] Fax: [phone]  [facility] [place], [place] [address] Phone: [phone] Fax: [phone]  [id number] [place], [place] [address] Phone: [phone] Fax: [phone]     Social Determinants of Health (SDOH) Interventions    Readmission Risk Interventions Readmission Risk Prevention Plan 06/28/2019  Transportation Screening Complete  HRI or [place] Complete  Social Work Consult for [place] Planning/Counseling Complete  Palliative Care  Screening Not Applicable  Medication Review Press photographer) Complete  Some recent data might be hidden     Final next level of care: [place] Barriers to Discharge: Barriers Resolved   Patient Goals and CMS Choice Patient states their goals for this hospitalization and ongoing recovery are:: return home CMS Medicare.gov Compare Post Acute Care list provided to:: Patient Choice offered to / list presented to : Patient  Discharge Placement                       Discharge Plan and Services In-house Referral: Clinical Social Work   Post Acute Care Choice: Home Health                    HH Arranged: RN, PT, OT [facility] Agency: [facility] [employer] Date [place]: 06/28/19   Representative spoke with at [place]: [place] ([place]) Interventions     Readmission Risk Interventions Readmission Risk Prevention Plan 06/28/2019  Transportation Screening Complete  HRI or [place] Complete  Social Work Consult for [place] Planning/Counseling [place] Screening Not Applicable  Medication Review Press photographer) Complete  Some recent data might be hidden

## 2019-06-29 ENCOUNTER — Other Ambulatory Visit: Payer: Self-pay | Admitting: Family Medicine

## 2019-06-30 LAB — CULTURE, BLOOD (ROUTINE X 2)
Culture: NO GROWTH
Special Requests: ADEQUATE

## 2019-07-01 ENCOUNTER — Telehealth: Payer: Self-pay | Admitting: Family Medicine

## 2019-07-01 NOTE — Telephone Encounter (Signed)
Appointment scheduled 07/04/2019.

## 2019-07-03 ENCOUNTER — Other Ambulatory Visit: Payer: Self-pay

## 2019-07-03 DIAGNOSIS — Z7951 Long term (current) use of inhaled steroids: Secondary | ICD-10-CM | POA: Diagnosis not present

## 2019-07-03 DIAGNOSIS — R911 Solitary pulmonary nodule: Secondary | ICD-10-CM | POA: Diagnosis not present

## 2019-07-03 DIAGNOSIS — I872 Venous insufficiency (chronic) (peripheral): Secondary | ICD-10-CM | POA: Diagnosis not present

## 2019-07-03 DIAGNOSIS — Z794 Long term (current) use of insulin: Secondary | ICD-10-CM | POA: Diagnosis not present

## 2019-07-03 DIAGNOSIS — I110 Hypertensive heart disease with heart failure: Secondary | ICD-10-CM | POA: Diagnosis not present

## 2019-07-03 DIAGNOSIS — Z7901 Long term (current) use of anticoagulants: Secondary | ICD-10-CM | POA: Diagnosis not present

## 2019-07-03 DIAGNOSIS — E1165 Type 2 diabetes mellitus with hyperglycemia: Secondary | ICD-10-CM | POA: Diagnosis not present

## 2019-07-03 DIAGNOSIS — E11319 Type 2 diabetes mellitus with unspecified diabetic retinopathy without macular edema: Secondary | ICD-10-CM | POA: Diagnosis not present

## 2019-07-03 DIAGNOSIS — E1140 Type 2 diabetes mellitus with diabetic neuropathy, unspecified: Secondary | ICD-10-CM | POA: Diagnosis not present

## 2019-07-03 DIAGNOSIS — I5033 Acute on chronic diastolic (congestive) heart failure: Secondary | ICD-10-CM | POA: Diagnosis not present

## 2019-07-03 DIAGNOSIS — I482 Chronic atrial fibrillation, unspecified: Secondary | ICD-10-CM | POA: Diagnosis not present

## 2019-07-03 DIAGNOSIS — M1990 Unspecified osteoarthritis, unspecified site: Secondary | ICD-10-CM | POA: Diagnosis not present

## 2019-07-03 DIAGNOSIS — E785 Hyperlipidemia, unspecified: Secondary | ICD-10-CM | POA: Diagnosis not present

## 2019-07-03 DIAGNOSIS — J9621 Acute and chronic respiratory failure with hypoxia: Secondary | ICD-10-CM | POA: Diagnosis not present

## 2019-07-03 DIAGNOSIS — K219 Gastro-esophageal reflux disease without esophagitis: Secondary | ICD-10-CM | POA: Diagnosis not present

## 2019-07-03 DIAGNOSIS — L03116 Cellulitis of left lower limb: Secondary | ICD-10-CM | POA: Diagnosis not present

## 2019-07-03 DIAGNOSIS — L03115 Cellulitis of right lower limb: Secondary | ICD-10-CM | POA: Diagnosis not present

## 2019-07-03 DIAGNOSIS — D472 Monoclonal gammopathy: Secondary | ICD-10-CM | POA: Diagnosis not present

## 2019-07-03 DIAGNOSIS — G4733 Obstructive sleep apnea (adult) (pediatric): Secondary | ICD-10-CM | POA: Diagnosis not present

## 2019-07-03 DIAGNOSIS — D696 Thrombocytopenia, unspecified: Secondary | ICD-10-CM | POA: Diagnosis not present

## 2019-07-03 DIAGNOSIS — E1151 Type 2 diabetes mellitus with diabetic peripheral angiopathy without gangrene: Secondary | ICD-10-CM | POA: Diagnosis not present

## 2019-07-03 DIAGNOSIS — J449 Chronic obstructive pulmonary disease, unspecified: Secondary | ICD-10-CM | POA: Diagnosis not present

## 2019-07-04 ENCOUNTER — Encounter: Payer: Self-pay | Admitting: Family Medicine

## 2019-07-04 ENCOUNTER — Ambulatory Visit (INDEPENDENT_AMBULATORY_CARE_PROVIDER_SITE_OTHER): Payer: Medicare Other | Admitting: Family Medicine

## 2019-07-04 ENCOUNTER — Other Ambulatory Visit: Payer: Self-pay | Admitting: Pharmacy Technician

## 2019-07-04 ENCOUNTER — Other Ambulatory Visit: Payer: Self-pay

## 2019-07-04 ENCOUNTER — Ambulatory Visit (INDEPENDENT_AMBULATORY_CARE_PROVIDER_SITE_OTHER): Payer: Medicare Other

## 2019-07-04 ENCOUNTER — Telehealth: Payer: Self-pay | Admitting: Family Medicine

## 2019-07-04 VITALS — BP 128/84 | HR 96 | Temp 97.8°F | Resp 22 | Ht 69.0 in | Wt 298.0 lb

## 2019-07-04 DIAGNOSIS — F411 Generalized anxiety disorder: Secondary | ICD-10-CM

## 2019-07-04 DIAGNOSIS — R531 Weakness: Secondary | ICD-10-CM

## 2019-07-04 DIAGNOSIS — J9611 Chronic respiratory failure with hypoxia: Secondary | ICD-10-CM

## 2019-07-04 DIAGNOSIS — E1165 Type 2 diabetes mellitus with hyperglycemia: Secondary | ICD-10-CM

## 2019-07-04 DIAGNOSIS — E119 Type 2 diabetes mellitus without complications: Secondary | ICD-10-CM | POA: Diagnosis not present

## 2019-07-04 DIAGNOSIS — G4733 Obstructive sleep apnea (adult) (pediatric): Secondary | ICD-10-CM

## 2019-07-04 DIAGNOSIS — I10 Essential (primary) hypertension: Secondary | ICD-10-CM | POA: Diagnosis not present

## 2019-07-04 DIAGNOSIS — E1140 Type 2 diabetes mellitus with diabetic neuropathy, unspecified: Secondary | ICD-10-CM

## 2019-07-04 DIAGNOSIS — J432 Centrilobular emphysema: Secondary | ICD-10-CM | POA: Diagnosis not present

## 2019-07-04 DIAGNOSIS — I5033 Acute on chronic diastolic (congestive) heart failure: Secondary | ICD-10-CM | POA: Diagnosis not present

## 2019-07-04 DIAGNOSIS — Z794 Long term (current) use of insulin: Secondary | ICD-10-CM

## 2019-07-04 DIAGNOSIS — R0602 Shortness of breath: Secondary | ICD-10-CM | POA: Diagnosis not present

## 2019-07-04 MED ORDER — ALPRAZOLAM 0.25 MG PO TABS: 0.2500 mg | ORAL_TABLET | Freq: Three times a day (TID) | ORAL | 2 refills | Status: DC | PRN

## 2019-07-04 NOTE — Progress Notes (Signed)
Subjective:  Patient ID: Justin Ruiz, male    DOB: Dec 12, 1950  Age: 69 y.o. MRN: [id number]  CC: Hospitalization Follow-up (Afib, CHF, Hyperglycemia, Weakness )   HPI RAMESES OU presents for dyspnea.  Patient is feeling more short of breath since arrival here today.  On 2 L he was hardly able to come in from the parking lot.  His wife turned his oxygen level up to 3 L but he still feels short of breath.  She says when he starts getting short of breath gets anxious and that that makes the shortness of breath worse.  His pulse oximetry reading noted below was done on his baseline 2 L nasal cannula and shown to be 98%.  He was hospitalized on March 2 through March 5.  He is currently using Trelegy for his COPD.  He was noted on hospitalization to have atrial fibrillation with rapid ventricular response.  He had an echocardiogram showing preserved left ventricular ejection fraction.  He is also diabetic who sugars were running in the 300s and 400s recently.  On presentation in the emergency room it was 498.  His heart rate at that time was in the 170s.  It was irregularly irregular at the time.  He was started on a Cardizem drip and that brought his pulse rate under control.  He is continuing to use Eliquis for blood thinner.  He was noted to have chronic and acute diastolic heart failure.  He had gram-positive cocci on blood culture.  A1c done there was 9.1.  He was placed on hold for his Metformin due to acute kidney injury and borderline creatinine.  Will consider resuming that should blood work show the acute kidney injury has resolved.  Depression screen [facility] 2/9 07/04/2019 03/07/2019 03/04/2019  Decreased Interest 0 1 1  Down, Depressed, Hopeless 0 1 1  PHQ - 2 Score 0 2 2  Altered sleeping - 1 1  Tired, decreased energy - 0 1  Change in appetite - 0 0  Feeling bad or failure about yourself  - 1 0  Trouble concentrating - 1 1  Moving slowly or fidgety/restless - 0 1  Suicidal thoughts -  3 0  PHQ-9 Score - - 6  Difficult doing work/chores - Somewhat difficult Somewhat difficult  Some recent data might be hidden    History Santhosh has a past medical history of Acute diastolic heart failure ([place]) (03/28/2016), Acute encephalopathy (03/28/2016), Acute encephalopathy (03/28/2016), Acute on chronic respiratory failure with hypoxia (HCC) (12/04/2016), Arthritis, Asthma, Atrial fibrillation ([place]), Benign neoplasm of ascending colon (05/05/2014), BPH (benign prostatic hypertrophy), CARDIOVASCULAR STUDIES, ABNORMAL (04/27/2009), Chronic anticoagulation (08/20/2012), Chronic respiratory failure with hypoxia ([place]) (11/10/2017), Colon polyps, COPD mixed type ([place]) (03/28/2016), Diabetes mellitus, Diabetic neuropathy, type II diabetes mellitus ([place]) (09/25/2014), Diabetic retinopathy ([place]) (05/28/2017), 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   He has a past surgical history that includes Appendectomy and Colonoscopy with propofol (N/A, 05/05/2014).   His family history includes Aortic aneurysm in his sister; COPD in his sister and sister; Colon cancer in his father; Colon polyps in his father; Diabetes in his maternal grandmother,  mother, and sister; Heart attack in his sister; Heart disease in his mother, sister, and sister; Liver cancer in his mother; Prostate cancer in his father; Pulmonary embolism in his sister.He reports that he quit smoking about 8  months ago. His smoking use included cigarettes. He started smoking about 50 years ago. He has a 40.00 pack-year smoking history. He has never used smokeless tobacco. He reports that he does not drink alcohol or use drugs.    ROS Review of Systems  Objective:  BP 128/84   Pulse 96   Temp 97.8 F (36.6 C) (Temporal)   Resp (!) 22   Ht _0  (1.753 m)   Wt 298 lb (135.2 kg)   SpO2 98%   BMI 44.01 kg/m   BP Readings from Last 3 Encounters:  07/04/19 128/84  06/28/19 105/69  02/27/19 120/82    Wt Readings from Last 3 Encounters:  07/04/19 298 lb (135.2 kg)  06/27/19 295 lb 3.1 oz (133.9 kg)  02/27/19 294 lb (133.4 kg)     Physical Exam Constitutional:      Appearance: He is obese. He is ill-appearing. He is not toxic-appearing or diaphoretic.  HENT:     Head: Normocephalic.     Right Ear: Tympanic membrane normal.     Left Ear: Tympanic membrane normal.     Nose: Nose normal.     Mouth/Throat:     Mouth: Mucous membranes are moist.  Eyes:     Extraocular Movements: Extraocular movements intact.     Conjunctiva/sclera: Conjunctivae normal.     Pupils: Pupils are equal, round, and reactive to light.  Cardiovascular:     Rate and Rhythm: Normal rate and regular rhythm.  Pulmonary:     Breath sounds: Wheezing and rales present.  Abdominal:     Palpations: Abdomen is soft.     Tenderness: There is no abdominal tenderness.  Musculoskeletal:     Cervical back: Neck supple. No tenderness.     Right lower leg: Edema (2+) present.     Left lower leg: Edema (2+) present.     Comments: Wheelchair bound  Skin:    General: Skin is warm and dry.  Neurological:     Mental Status: He is alert.  Psychiatric:        Thought Content: Thought content normal.    Chest x-ray shows bilateral pleural effusions.   Assessment & Plan:   Jemarcus was seen today for hospitalization follow-up.  Diagnoses and all orders for this visit:  Shortness of breath -     CBC with  Differential/Platelet -     CMP14+EGFR -     DG Chest 2 View; Future -     Brain natriuretic peptide -     DME Wheelchair manual  Acute on chronic diastolic CHF (congestive heart failure) (HCC) -     CBC with Differential/Platelet -     DG Chest 2 View; Future -     Brain natriuretic peptide -     DME Wheelchair manual  Diabetes mellitus type 2, insulin dependent (HCC)  Obesity, morbid ([place]) -     DME Wheelchair manual  Hypertension  Generalized anxiety disorder -     ALPRAZolam (XANAX) 0.25 MG tablet; Take 1 tablet (0.25 mg total) by mouth 3 (three) times daily as needed. for anxiety  Obstructive sleep apnea  Type 2 diabetes mellitus with diabetic neuropathy, with long-term current use of insulin (HCC) -     CBC with Differential/Platelet -  DME Wheelchair manual  Chronic respiratory failure with hypoxia ([place]) -     DG Chest 2 View; Future -     DME Wheelchair manual  Uncontrolled type 2 diabetes mellitus with hyperglycemia (HCC)  General weakness -     DME Wheelchair manual  Patient gets extremely panicky when he gets short of breath and his wife feels he is having a panic attack from her observations.  He is calmed quite well by alprazolam.  Of note is that his pulse ox does not drop during these times.  She went ahead and increased his oxygen to 3 L and that did not seem to help especially considering that his pulse ox was in the mid 90s even during the episode.  As result it does seem that there is element of panic he has a history of generalized anxiety disorder anyway.  Although use of the benzodiazepines is not ideal, panic disorder is one of the indications for them he has been advised to be judicious based on the potential for habit-forming with him.  Hopefully we can get him past this and wean them fairly quickly.  Patient also is having a lot of generalized weakness I think this is based on his overall condition not any one particular situation.  However the  chronic respiratory condition combined with the morbid obesity has contributed.  His tendency toward anxiety makes it more difficult for him to compensate physically.  Therefore I think that having a wheelchair at least use outside of the home to prevent falling is appropriate.  I would like for him to continue to use his walker as much as possible in the home so he can maintain muscle tone.  He and his wife are in agreement.     I have discontinued [name] B. Jutte's cephALEXin and Spiriva Respimat. I am also having him maintain his Lancing Device, Vitamin D3, ipratropium-albuterol, albuterol, B-D SINGLE USE SWABS REGULAR, Accu-Chek Aviva Plus, Accu-Chek Aviva Plus, Accu-Chek FastClix Lancets, Eliquis, simvastatin, insulin regular, insulin NPH Human, Ozempic (1 MG/DOSE), acetaminophen, furosemide, PARoxetine, metoprolol tartrate, mometasone-formoterol, nystatin, Trelegy Ellipta, and ALPRAZolam.  Allergies as of 07/04/2019      Reactions   Avapro [irbesartan] Other (See Comments)   "doesnt sit right with me"--light headed   Lipitor [atorvastatin Calcium] Other (See Comments)   Leg pain      Medication List       Accurate as of July 04, 2019 11:59 PM. If you have any questions, ask your nurse or doctor.        STOP taking these medications   cephALEXin 500 MG capsule Commonly known as: KEFLEX Stopped by: [name], MD   Spiriva Respimat 2.5 MCG/ACT Aers Generic drug: Tiotropium Bromide Monohydrate Stopped by: [name], MD     TAKE these medications   Accu-Chek Aviva Plus test strip Generic drug: glucose blood Test BS QID Dx E11.9   Accu-Chek Aviva Plus w/Device Kit Test BS QID DX E11.9   Accu-Chek FastClix Lancets Misc Test BS QID Dx E11.9   acetaminophen 325 MG tablet Commonly known as: TYLENOL Take 650 mg by mouth every 6 (six) hours as needed.   albuterol 108 (90 Base) MCG/ACT inhaler Commonly known as: VENTOLIN HFA Inhale 2 puffs into the lungs every 6  (six) hours as needed for wheezing or shortness of breath.   ALPRAZolam 0.25 MG tablet Commonly known as: XANAX Take 1 tablet (0.25 mg total) by mouth 3 (three) times daily as needed. for anxiety  B-D SINGLE USE SWABS REGULAR Pads Test BS QID Dx E11.9   Eliquis 5 MG Tabs tablet Generic drug: apixaban Take 1 tablet by mouth twice daily   furosemide 40 MG tablet Commonly known as: LASIX TAKE 1 TABLET BY MOUTH TWICE DAILY AS DIRECTED   insulin NPH Human 100 UNIT/ML injection Commonly known as: NovoLIN N ReliOn Inject 0.55 mLs (55 Units total) into the skin 2 (two) times daily before a meal.   insulin regular 100 units/mL injection Commonly known as: NOVOLIN R Inject 15 units before breakfast and supper   ipratropium-albuterol 0.5-2.5 (3) MG/3ML Soln Commonly known as: DUONEB Take 3 mLs by nebulization every 6 (six) hours as needed.   Lancing Device Misc USE AS DIRECTED   metoprolol tartrate 100 MG tablet Commonly known as: LOPRESSOR Take 1 tablet by mouth twice daily   mometasone-formoterol 200-5 MCG/ACT Aero Commonly known as: DULERA Inhale 2 puffs into the lungs 2 (two) times daily.   nystatin powder Commonly known as: MYCOSTATIN/NYSTOP Apply topically 2 (two) times daily. Apply to groin area and scrotum skin; keep area clean and dry.   Ozempic (1 MG/DOSE) 2 MG/1.5ML Sopn Generic drug: Semaglutide (1 MG/DOSE) Inject 1 mg into the skin once a week.   PARoxetine 20 MG tablet Commonly known as: PAXIL Take 1 tablet by mouth once daily   simvastatin 40 MG tablet Commonly known as: ZOCOR Take 1 tablet by mouth once daily   Trelegy Ellipta 100-62.5-25 MCG/INH Aepb Generic drug: Fluticasone-Umeclidin-Vilant Take 1 puff by mouth daily.   Vitamin D3 125 MCG (5000 UT) Tabs Take 1 tablet by mouth every morning.            Durable Medical Equipment  (From admission, onward)         Start     Ordered   07/04/19 0000  DME Wheelchair manual    Comments:  Patient suffers from venous insufficiency, CHF which impairs their ability to perform daily activities like bathing, dressing, feeding and toileting in the home.  A cane, crutch or walker will not resolve issue with performing activities of daily living. A wheelchair will allow patient to safely perform daily activities. Patient can safely propel the wheelchair in the home or has a caregiver who can provide assistance. Length of need Lifetime. Accessories: elevating leg rests (ELRs), wheel locks, extensions and anti-tippers.   07/04/19 1357           Follow-up: Return in about 1 week (around 07/11/2019).  [name], M.D.

## 2019-07-04 NOTE — Patient Outreach (Signed)
[facility]) Care Management  07/04/2019  QUADRI PERDOMO [birth year] [id number]    Received both patient and provider portion(s) of patient assistance application(s) for Novolin N, Novolin R, Ozempic, Spiriva Respimat, Dulera and Proventil HFA. Faxed and mailed completed applications and required documents into [employer] (69meds) and [employer] (26med) were faxed and [employer] (66meds) was mailed.  Will follow up with company(ies) in 5-10 business days to check status of application(s) for [employer] and [employer] and 20-25 business days for [employer].  Durinda Buzzelli P. Kathelene Rumberger, [place]  [phone] `

## 2019-07-04 NOTE — Telephone Encounter (Signed)
Pt wife aware to take the printed order that was given today - to the pharmacy

## 2019-07-05 ENCOUNTER — Encounter: Payer: Self-pay | Admitting: Family Medicine

## 2019-07-05 DIAGNOSIS — I509 Heart failure, unspecified: Secondary | ICD-10-CM | POA: Diagnosis not present

## 2019-07-05 DIAGNOSIS — M6281 Muscle weakness (generalized): Secondary | ICD-10-CM | POA: Diagnosis not present

## 2019-07-05 DIAGNOSIS — I872 Venous insufficiency (chronic) (peripheral): Secondary | ICD-10-CM | POA: Diagnosis not present

## 2019-07-05 DIAGNOSIS — J9611 Chronic respiratory failure with hypoxia: Secondary | ICD-10-CM | POA: Diagnosis not present

## 2019-07-05 LAB — CBC WITH DIFFERENTIAL/PLATELET
Basophils Absolute: 0 10*3/uL (ref 0.0–0.2)
Basos: 1 %
EOS (ABSOLUTE): 0.1 10*3/uL (ref 0.0–0.4)
Eos: 1 %
Hematocrit: 36.6 % — ABNORMAL LOW (ref 37.5–51.0)
Hemoglobin: 12.1 g/dL — ABNORMAL LOW (ref 13.0–17.7)
Immature Grans (Abs): 0 10*3/uL (ref 0.0–0.1)
Immature Granulocytes: 0 %
Lymphocytes Absolute: 1.3 10*3/uL (ref 0.7–3.1)
Lymphs: 17 %
MCH: 30.3 pg (ref 26.6–33.0)
MCHC: 33.1 g/dL (ref 31.5–35.7)
MCV: 92 fL (ref 79–97)
Monocytes Absolute: 0.5 10*3/uL (ref 0.1–0.9)
Monocytes: 7 %
Neutrophils Absolute: 5.4 10*3/uL (ref 1.4–7.0)
Neutrophils: 74 %
Platelets: 301 10*3/uL (ref 150–450)
RBC: 3.99 x10E6/uL — ABNORMAL LOW (ref 4.14–5.80)
RDW: 12.9 % (ref 11.6–15.4)
WBC: 7.4 10*3/uL (ref 3.4–10.8)

## 2019-07-05 LAB — CMP14+EGFR
ALT: 10 IU/L (ref 0–44)
AST: 14 IU/L (ref 0–40)
Albumin/Globulin Ratio: 1.2 (ref 1.2–2.2)
Albumin: 3.6 g/dL — ABNORMAL LOW (ref 3.8–4.8)
Alkaline Phosphatase: 99 IU/L (ref 39–117)
BUN/Creatinine Ratio: 23 (ref 10–24)
BUN: 23 mg/dL (ref 8–27)
Bilirubin Total: 0.7 mg/dL (ref 0.0–1.2)
CO2: 32 mmol/L — ABNORMAL HIGH (ref 20–29)
Calcium: 9.2 mg/dL (ref 8.6–10.2)
Chloride: 95 mmol/L — ABNORMAL LOW (ref 96–106)
Creatinine, Ser: 1.02 mg/dL (ref 0.76–1.27)
GFR calc Af Amer: 87 mL/min/{1.73_m2} (ref >59)
GFR calc non Af Amer: 75 mL/min/{1.73_m2} (ref >59)
Globulin, Total: 3 g/dL (ref 1.5–4.5)
Glucose: 45 mg/dL — ABNORMAL LOW (ref 65–99)
Potassium: 4.2 mmol/L (ref 3.5–5.2)
Sodium: 140 mmol/L (ref 134–144)
Total Protein: 6.6 g/dL (ref 6.0–8.5)

## 2019-07-05 LAB — BRAIN NATRIURETIC PEPTIDE: BNP: 159 pg/mL — ABNORMAL HIGH (ref 0.0–100.0)

## 2019-07-05 NOTE — Progress Notes (Signed)
Yesterday, when you felt so badly in the office, I believe it was a result of your blood sugar dropping now that I see it was 45 when it was drawn yesterday.  As result, when you feel that panicky feeling you should start with checking your blood sugar and perhaps taking some sugar if it is below 80 prior to taking the Xanax for panic.  Please reserve the Xanax as a last resort.  Otherwise there was no significant abnormality noted on your blood work or chest x-ray.

## 2019-07-07 DIAGNOSIS — J449 Chronic obstructive pulmonary disease, unspecified: Secondary | ICD-10-CM | POA: Diagnosis not present

## 2019-07-08 ENCOUNTER — Emergency Department (HOSPITAL_COMMUNITY): Payer: Medicare Other

## 2019-07-08 ENCOUNTER — Inpatient Hospital Stay (HOSPITAL_COMMUNITY)
Admission: EM | Admit: 2019-07-08 | Discharge: 2019-07-10 | DRG: 314 | Disposition: A | Discharge status: Home Health | Payer: Medicare Other | Attending: Family Medicine | Admitting: Family Medicine

## 2019-07-08 ENCOUNTER — Telehealth: Payer: Self-pay | Admitting: *Deleted

## 2019-07-08 ENCOUNTER — Encounter (HOSPITAL_COMMUNITY): Payer: Self-pay

## 2019-07-08 ENCOUNTER — Ambulatory Visit (INDEPENDENT_AMBULATORY_CARE_PROVIDER_SITE_OTHER): Payer: Medicare Other | Admitting: Licensed Clinical Social Worker

## 2019-07-08 ENCOUNTER — Other Ambulatory Visit: Payer: Self-pay

## 2019-07-08 ENCOUNTER — Telehealth: Payer: Self-pay | Admitting: Family Medicine

## 2019-07-08 DIAGNOSIS — Z8249 Family history of ischemic heart disease and other diseases of the circulatory system: Secondary | ICD-10-CM | POA: Diagnosis not present

## 2019-07-08 DIAGNOSIS — J9611 Chronic respiratory failure with hypoxia: Secondary | ICD-10-CM | POA: Diagnosis present

## 2019-07-08 DIAGNOSIS — K219 Gastro-esophageal reflux disease without esophagitis: Secondary | ICD-10-CM

## 2019-07-08 DIAGNOSIS — M1990 Unspecified osteoarthritis, unspecified site: Secondary | ICD-10-CM | POA: Diagnosis not present

## 2019-07-08 DIAGNOSIS — E1165 Type 2 diabetes mellitus with hyperglycemia: Secondary | ICD-10-CM | POA: Diagnosis present

## 2019-07-08 DIAGNOSIS — I872 Venous insufficiency (chronic) (peripheral): Secondary | ICD-10-CM | POA: Diagnosis not present

## 2019-07-08 DIAGNOSIS — E119 Type 2 diabetes mellitus without complications: Secondary | ICD-10-CM

## 2019-07-08 DIAGNOSIS — Z9981 Dependence on supplemental oxygen: Secondary | ICD-10-CM

## 2019-07-08 DIAGNOSIS — E782 Mixed hyperlipidemia: Secondary | ICD-10-CM

## 2019-07-08 DIAGNOSIS — J449 Chronic obstructive pulmonary disease, unspecified: Secondary | ICD-10-CM | POA: Diagnosis not present

## 2019-07-08 DIAGNOSIS — I10 Essential (primary) hypertension: Secondary | ICD-10-CM

## 2019-07-08 DIAGNOSIS — Z8572 Personal history of non-Hodgkin lymphomas: Secondary | ICD-10-CM

## 2019-07-08 DIAGNOSIS — E785 Hyperlipidemia, unspecified: Secondary | ICD-10-CM | POA: Diagnosis present

## 2019-07-08 DIAGNOSIS — L03116 Cellulitis of left lower limb: Secondary | ICD-10-CM | POA: Diagnosis not present

## 2019-07-08 DIAGNOSIS — Z833 Family history of diabetes mellitus: Secondary | ICD-10-CM

## 2019-07-08 DIAGNOSIS — I482 Chronic atrial fibrillation, unspecified: Secondary | ICD-10-CM | POA: Diagnosis not present

## 2019-07-08 DIAGNOSIS — I313 Pericardial effusion (noninflammatory): Secondary | ICD-10-CM

## 2019-07-08 DIAGNOSIS — G4733 Obstructive sleep apnea (adult) (pediatric): Secondary | ICD-10-CM | POA: Diagnosis not present

## 2019-07-08 DIAGNOSIS — Z7901 Long term (current) use of anticoagulants: Secondary | ICD-10-CM | POA: Diagnosis not present

## 2019-07-08 DIAGNOSIS — Z20822 Contact with and (suspected) exposure to covid-19: Secondary | ICD-10-CM | POA: Diagnosis not present

## 2019-07-08 DIAGNOSIS — E11319 Type 2 diabetes mellitus with unspecified diabetic retinopathy without macular edema: Secondary | ICD-10-CM | POA: Diagnosis present

## 2019-07-08 DIAGNOSIS — I309 Acute pericarditis, unspecified: Principal | ICD-10-CM

## 2019-07-08 DIAGNOSIS — Z6841 Body Mass Index (BMI) 40.0 and over, adult: Secondary | ICD-10-CM | POA: Diagnosis not present

## 2019-07-08 DIAGNOSIS — Z9221 Personal history of antineoplastic chemotherapy: Secondary | ICD-10-CM

## 2019-07-08 DIAGNOSIS — I4821 Permanent atrial fibrillation: Secondary | ICD-10-CM | POA: Diagnosis not present

## 2019-07-08 DIAGNOSIS — I959 Hypotension, unspecified: Secondary | ICD-10-CM | POA: Diagnosis not present

## 2019-07-08 DIAGNOSIS — Z7951 Long term (current) use of inhaled steroids: Secondary | ICD-10-CM

## 2019-07-08 DIAGNOSIS — Z87891 Personal history of nicotine dependence: Secondary | ICD-10-CM

## 2019-07-08 DIAGNOSIS — J9621 Acute and chronic respiratory failure with hypoxia: Secondary | ICD-10-CM | POA: Diagnosis not present

## 2019-07-08 DIAGNOSIS — D472 Monoclonal gammopathy: Secondary | ICD-10-CM | POA: Diagnosis present

## 2019-07-08 DIAGNOSIS — Z794 Long term (current) use of insulin: Secondary | ICD-10-CM | POA: Diagnosis not present

## 2019-07-08 DIAGNOSIS — E1151 Type 2 diabetes mellitus with diabetic peripheral angiopathy without gangrene: Secondary | ICD-10-CM | POA: Diagnosis present

## 2019-07-08 DIAGNOSIS — Z800 Family history of malignant neoplasm of digestive organs: Secondary | ICD-10-CM

## 2019-07-08 DIAGNOSIS — Z825 Family history of asthma and other chronic lower respiratory diseases: Secondary | ICD-10-CM | POA: Diagnosis not present

## 2019-07-08 DIAGNOSIS — I5032 Chronic diastolic (congestive) heart failure: Secondary | ICD-10-CM

## 2019-07-08 DIAGNOSIS — R0602 Shortness of breath: Secondary | ICD-10-CM | POA: Diagnosis not present

## 2019-07-08 DIAGNOSIS — E1140 Type 2 diabetes mellitus with diabetic neuropathy, unspecified: Secondary | ICD-10-CM | POA: Diagnosis present

## 2019-07-08 DIAGNOSIS — F411 Generalized anxiety disorder: Secondary | ICD-10-CM

## 2019-07-08 DIAGNOSIS — Z8042 Family history of malignant neoplasm of prostate: Secondary | ICD-10-CM | POA: Diagnosis not present

## 2019-07-08 DIAGNOSIS — Z8371 Family history of colonic polyps: Secondary | ICD-10-CM | POA: Diagnosis not present

## 2019-07-08 DIAGNOSIS — I110 Hypertensive heart disease with heart failure: Secondary | ICD-10-CM | POA: Diagnosis present

## 2019-07-08 DIAGNOSIS — N400 Enlarged prostate without lower urinary tract symptoms: Secondary | ICD-10-CM | POA: Diagnosis present

## 2019-07-08 DIAGNOSIS — R000 Tachycardia, unspecified: Secondary | ICD-10-CM | POA: Diagnosis not present

## 2019-07-08 DIAGNOSIS — D696 Thrombocytopenia, unspecified: Secondary | ICD-10-CM | POA: Diagnosis not present

## 2019-07-08 DIAGNOSIS — R911 Solitary pulmonary nodule: Secondary | ICD-10-CM | POA: Diagnosis not present

## 2019-07-08 DIAGNOSIS — Z888 Allergy status to other drugs, medicaments and biological substances status: Secondary | ICD-10-CM

## 2019-07-08 DIAGNOSIS — L03115 Cellulitis of right lower limb: Secondary | ICD-10-CM | POA: Diagnosis not present

## 2019-07-08 DIAGNOSIS — I5033 Acute on chronic diastolic (congestive) heart failure: Secondary | ICD-10-CM | POA: Diagnosis present

## 2019-07-08 LAB — CBC WITH DIFFERENTIAL/PLATELET
Abs Immature Granulocytes: 0.03 10*3/uL (ref 0.00–0.07)
Basophils Absolute: 0 10*3/uL (ref 0.0–0.1)
Basophils Relative: 0 %
Eosinophils Absolute: 0.1 10*3/uL (ref 0.0–0.5)
Eosinophils Relative: 1 %
HCT: 38.5 % — ABNORMAL LOW (ref 39.0–52.0)
Hemoglobin: 11.9 g/dL — ABNORMAL LOW (ref 13.0–17.0)
Immature Granulocytes: 0 %
Lymphocytes Relative: 11 %
Lymphs Abs: 1.1 10*3/uL (ref 0.7–4.0)
MCH: 29.4 pg (ref 26.0–34.0)
MCHC: 30.9 g/dL (ref 30.0–36.0)
MCV: 95.1 fL (ref 80.0–100.0)
Monocytes Absolute: 0.9 10*3/uL (ref 0.1–1.0)
Monocytes Relative: 9 %
Neutro Abs: 7.8 10*3/uL — ABNORMAL HIGH (ref 1.7–7.7)
Neutrophils Relative %: 79 %
Platelets: 287 10*3/uL (ref 150–400)
RBC: 4.05 MIL/uL — ABNORMAL LOW (ref 4.22–5.81)
RDW: 13.1 % (ref 11.5–15.5)
WBC: 9.9 10*3/uL (ref 4.0–10.5)
nRBC: 0 % (ref 0.0–0.2)

## 2019-07-08 LAB — COMPREHENSIVE METABOLIC PANEL
ALT: 11 U/L (ref 0–44)
AST: 15 U/L (ref 15–41)
Albumin: 3.1 g/dL — ABNORMAL LOW (ref 3.5–5.0)
Alkaline Phosphatase: 96 U/L (ref 38–126)
Anion gap: 10 (ref 5–15)
BUN: 28 mg/dL — ABNORMAL HIGH (ref 8–23)
CO2: 36 mmol/L — ABNORMAL HIGH (ref 22–32)
Calcium: 8.9 mg/dL (ref 8.9–10.3)
Chloride: 91 mmol/L — ABNORMAL LOW (ref 98–111)
Creatinine, Ser: 1.2 mg/dL (ref 0.61–1.24)
GFR calc Af Amer: >60 mL/min (ref >60)
GFR calc non Af Amer: >60 mL/min (ref >60)
Glucose, Bld: 98 mg/dL (ref 70–99)
Potassium: 4 mmol/L (ref 3.5–5.1)
Sodium: 137 mmol/L (ref 135–145)
Total Bilirubin: 1.8 mg/dL — ABNORMAL HIGH (ref 0.3–1.2)
Total Protein: 7.4 g/dL (ref 6.5–8.1)

## 2019-07-08 LAB — TSH: TSH: 1.256 u[IU]/mL (ref 0.350–4.500)

## 2019-07-08 LAB — GLUCOSE, CAPILLARY
Glucose-Capillary: 32 mg/dL — CL (ref 70–99)
Glucose-Capillary: 36 mg/dL — CL (ref 70–99)
Glucose-Capillary: 138 mg/dL — ABNORMAL HIGH (ref 70–99)
Glucose-Capillary: 151 mg/dL — ABNORMAL HIGH (ref 70–99)

## 2019-07-08 LAB — SEDIMENTATION RATE: Sed Rate: 65 mm/hr — ABNORMAL HIGH (ref 0–16)

## 2019-07-08 LAB — C-REACTIVE PROTEIN: CRP: 12.3 mg/dL — ABNORMAL HIGH (ref <1.0)

## 2019-07-08 LAB — BRAIN NATRIURETIC PEPTIDE: B Natriuretic Peptide: 151 pg/mL — ABNORMAL HIGH (ref 0.0–100.0)

## 2019-07-08 MED ORDER — ACETAMINOPHEN 325 MG PO TABS: 650.0000 mg | ORAL_TABLET | Freq: Four times a day (QID) | ORAL | Status: DC | PRN

## 2019-07-08 MED ORDER — SODIUM CHLORIDE 0.9 % IV SOLN: 250.0000 mL | INTRAVENOUS | Status: DC | PRN

## 2019-07-08 MED ORDER — SODIUM CHLORIDE 0.9% FLUSH
3.0000 mL | Freq: Two times a day (BID) | INTRAVENOUS | Status: DC
  Administered 2019-07-08 – 2019-07-10 (×3): 3 mL via INTRAVENOUS

## 2019-07-08 MED ORDER — FUROSEMIDE 40 MG PO TABS: 40.0000 mg | ORAL_TABLET | Freq: Every day | ORAL | Status: DC

## 2019-07-08 MED ORDER — DEXTROSE 50 % IV SOLN
INTRAVENOUS | Status: AC
  Administered 2019-07-08: 50 mL via INTRAVENOUS
  Filled 2019-07-08: qty 50

## 2019-07-08 MED ORDER — INSULIN ASPART 100 UNIT/ML ~~LOC~~ SOLN: 0.0000 [IU] | SUBCUTANEOUS | Status: DC

## 2019-07-08 MED ORDER — INSULIN DETEMIR 100 UNIT/ML ~~LOC~~ SOLN
10.0000 [IU] | Freq: Two times a day (BID) | SUBCUTANEOUS | Status: DC
  Filled 2019-07-08: qty 0.1

## 2019-07-08 MED ORDER — IOHEXOL 350 MG/ML SOLN
100.0000 mL | Freq: Once | INTRAVENOUS | Status: AC | PRN
  Administered 2019-07-08: 18:00:00 100 mL via INTRAVENOUS

## 2019-07-08 MED ORDER — FLUTICASONE FUROATE-VILANTEROL 100-25 MCG/INH IN AEPB
1.0000 | INHALATION_SPRAY | Freq: Every day | RESPIRATORY_TRACT | Status: DC
  Administered 2019-07-09 – 2019-07-10 (×2): 1 via RESPIRATORY_TRACT
  Filled 2019-07-08 (×2): qty 28

## 2019-07-08 MED ORDER — SODIUM CHLORIDE 0.9% FLUSH
3.0000 mL | Freq: Two times a day (BID) | INTRAVENOUS | Status: DC
  Administered 2019-07-09 (×2): 3 mL via INTRAVENOUS

## 2019-07-08 MED ORDER — IPRATROPIUM-ALBUTEROL 0.5-2.5 (3) MG/3ML IN SOLN
3.0000 mL | Freq: Four times a day (QID) | RESPIRATORY_TRACT | Status: DC | PRN
  Administered 2019-07-09 – 2019-07-10 (×3): 3 mL via RESPIRATORY_TRACT
  Filled 2019-07-08 (×2): qty 3

## 2019-07-08 MED ORDER — SENNOSIDES-DOCUSATE SODIUM 8.6-50 MG PO TABS: 1.0000 | ORAL_TABLET | Freq: Every evening | ORAL | Status: DC | PRN

## 2019-07-08 MED ORDER — SIMVASTATIN 20 MG PO TABS
40.0000 mg | ORAL_TABLET | Freq: Every day | ORAL | Status: DC
  Administered 2019-07-09: 40 mg via ORAL
  Filled 2019-07-08: qty 2

## 2019-07-08 MED ORDER — UMECLIDINIUM BROMIDE 62.5 MCG/INH IN AEPB
1.0000 | INHALATION_SPRAY | Freq: Every day | RESPIRATORY_TRACT | Status: DC
  Administered 2019-07-09 – 2019-07-10 (×2): 1 via RESPIRATORY_TRACT
  Filled 2019-07-08 (×2): qty 7

## 2019-07-08 MED ORDER — FUROSEMIDE 80 MG PO TABS
80.0000 mg | ORAL_TABLET | Freq: Every day | ORAL | Status: DC
  Filled 2019-07-08: qty 1

## 2019-07-08 MED ORDER — FLUTICASONE-UMECLIDIN-VILANT 100-62.5-25 MCG/INH IN AEPB: 1.0000 | INHALATION_SPRAY | Freq: Every day | RESPIRATORY_TRACT | Status: DC

## 2019-07-08 MED ORDER — DEXTROSE 50 % IV SOLN: 50.0000 mL | Freq: Once | INTRAVENOUS | Status: AC

## 2019-07-08 MED ORDER — INSULIN ASPART 100 UNIT/ML ~~LOC~~ SOLN: 0.0000 [IU] | Freq: Three times a day (TID) | SUBCUTANEOUS | Status: DC

## 2019-07-08 MED ORDER — PAROXETINE HCL 20 MG PO TABS
20.0000 mg | ORAL_TABLET | Freq: Every day | ORAL | Status: DC
  Administered 2019-07-09 – 2019-07-10 (×2): 20 mg via ORAL
  Filled 2019-07-08 (×2): qty 1

## 2019-07-08 MED ORDER — HYDROCODONE-ACETAMINOPHEN 5-325 MG PO TABS: 1.0000 | ORAL_TABLET | Freq: Four times a day (QID) | ORAL | Status: DC | PRN

## 2019-07-08 MED ORDER — ALPRAZOLAM 0.25 MG PO TABS
0.2500 mg | ORAL_TABLET | Freq: Three times a day (TID) | ORAL | Status: DC | PRN
  Administered 2019-07-09 – 2019-07-10 (×3): 0.25 mg via ORAL
  Filled 2019-07-08 (×3): qty 1

## 2019-07-08 MED ORDER — ACETAMINOPHEN 650 MG RE SUPP: 650.0000 mg | Freq: Four times a day (QID) | RECTAL | Status: DC | PRN

## 2019-07-08 MED ORDER — SODIUM CHLORIDE 0.9% FLUSH: 3.0000 mL | INTRAVENOUS | Status: DC | PRN

## 2019-07-08 NOTE — H&P (Signed)
History and Physical    [name] TWS:[id number] DOB: 08/08/1950 DOA: 07/08/2019  PCP: [name], MD   Patient coming from: Home   Chief Complaint: SOB   HPI: Justin Ruiz is a 69 y.o. male with medical history significant for COPD, OSA on CPAP, chronic hypoxic respiratory failure, atrial fibrillation on Eliquis, chronic diastolic CHF, anxiety disorder, and insulin-dependent diabetes mellitus, now presenting to the emergency department with 3 days of progressive dyspnea.  The patient denies any fevers, chills, cough, wheezing, or worsening in his chronic bilateral leg swelling.  Dyspnea is mainly with exertion.  He also notes some increased fatigue.  He was having more anxiety than usual yesterday and this was thought to be related to hypoglycemia as he was found to have a serum glucose of 45 yesterday.  He has not been having any chest pain, palpitations, lightheadedness, near-syncope, or syncope.  He has a history of non-Hodgkin's lymphoma treated with chemotherapy several years ago, now following with oncology annually for observation.  ED Course: Upon arrival to the ED, patient is found to be afebrile, saturating 100% on 4 L/min of supplemental oxygen, and with stable blood pressure and normal heart rate.  EKG features atrial fibrillation with rate 91.  CTA chest is negative for PE but notable for small pleural effusions and large pericardial effusion.  Chemistry panel notable for bicarbonate of 36, BUN of 28, and total bilirubin 1.8.  CBC with slight normocytic anemia.  COVID-19 PCR screening test is in process.  ED physician discussed the case with Dr. Domenic Polite of cardiology who recommended a medical admission to [facility] for echocardiogram and formal cardiology consultation in the morning.  Review of Systems:  All other systems reviewed and apart from HPI, are negative.  Past Medical History:  Diagnosis Date  . Acute diastolic heart failure ([place]) 03/28/2016  . Acute  encephalopathy 03/28/2016  . Acute encephalopathy 03/28/2016  . Acute on chronic respiratory failure with hypoxia ([place]) 12/04/2016  . Arthritis   . Asthma   . Atrial fibrillation ([place])    on AC  . Benign neoplasm of ascending colon 05/05/2014  . BPH (benign prostatic hypertrophy)   . CARDIOVASCULAR STUDIES, ABNORMAL 04/27/2009   Qualifier: Diagnosis of  By: Percival Spanish, MD, Farrel Gordon    . Chronic anticoagulation 08/20/2012  . Chronic respiratory failure with hypoxia ([place]) 11/10/2017  . Colon polyps   . COPD mixed type ([place]) 03/28/2016   PFT 06/17/14- severe obstructive airways disease with slight response to bronchodilator. FVC 2.03/42%, FEV1 1.02/28%, ratio 0.5, TLC 82%, DLCO 59%  . Diabetes mellitus   . Diabetic neuropathy, type II diabetes mellitus ([place]) 09/25/2014  . Diabetic retinopathy ([place]) 05/28/2017  . 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 GERD (gastroesophageal reflux disease)   . HTN (hypertension)    x 3 years  . Hyperlipidemia   . Lung nodules 03/11/2013  . Mediastinal adenopathy 06/20/2013   CT  & PET 2/15   . Memory loss   . 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 NHL (non-Hodgkin's lymphoma) ([place])    nhl dx 9/11  . Obesity    exogenous  . Obstructive sleep apnea 05/25/2014   NPSG-  04/07/2014-severe obstructive sleep apnea-AHI 75.1 per hour. CPAP titrated to 16. He required supplemental oxygen at 2 L/ [employer] which he already wears for sleep at home. Weight 338 pounds   . Peripheral vascular insufficiency ([place]) 08/02/2017  . Personal history of colonic  polyps 03/02/2009   Qualifier: Diagnosis of  By: Deatra Ina MD, Sandy Salaam   . Sepsis ([place]) 06/07/2015  . Severe obesity (BMI >= 40) ([place]) 05/01/2013  . Shingles   . Sleep apnea    CPAP machine is broken  . Thrombocytopenia ([place]) 11/23/2016    Past Surgical History:  Procedure Laterality Date  . APPENDECTOMY    . COLONOSCOPY WITH PROPOFOL N/A 05/05/2014   Procedure:  COLONOSCOPY WITH PROPOFOL;  Surgeon: Inda Castle, MD;  Location: [facility] ENDOSCOPY;  Service: Endoscopy;  Laterality: N/A;     reports that he quit smoking about 8 months ago. His smoking use included cigarettes. He started smoking about 50 years ago. He has a 40.00 pack-year smoking history. He has never used smokeless tobacco. He reports that he does not drink alcohol or use drugs.  Allergies  Allergen Reactions  . Avapro [Irbesartan] Other (See Comments)    "doesnt sit right with me"--light headed  . Lipitor [Atorvastatin Calcium] Other (See Comments)    Leg pain    Family History  Problem Relation Age of Onset  . Liver cancer Mother   . Diabetes Mother   . Heart disease Mother   . Colon cancer Father   . Prostate cancer Father   . Colon polyps Father   . Pulmonary embolism Sister   . Diabetes Sister   . Heart attack Sister   . Aortic aneurysm Sister   . COPD Sister   . Heart disease Sister   . COPD Sister   . Heart disease Sister   . Diabetes Maternal Grandmother      Prior to Admission medications   Medication Sig Start Date End Date Taking? Authorizing Provider  Accu-Chek FastClix Lancets MISC Test BS QID Dx E11.9 01/28/19  Yes [name], MD  acetaminophen (TYLENOL) 325 MG tablet Take 650 mg by mouth every 6 (six) hours as needed.   Yes [provider]  albuterol (VENTOLIN HFA) 108 (90 Base) MCG/ACT inhaler Inhale 2 puffs into the lungs every 6 (six) hours as needed for wheezing or shortness of breath. 08/23/18  Yes Young, Tarri Fuller D, MD  Alcohol Swabs (B-D SINGLE USE SWABS REGULAR) PADS Test BS QID Dx E11.9 01/28/19  Yes Stacks, [name], MD  ALPRAZolam Duanne Moron) 0.25 MG tablet Take 1 tablet (0.25 mg total) by mouth 3 (three) times daily as needed. for anxiety 07/04/19  Yes Stacks, [name], MD  Blood Glucose Monitoring Suppl (ACCU-CHEK AVIVA PLUS) w/Device KIT Test BS QID DX E11.9 01/28/19  Yes Stacks, [name], MD  cephALEXin (KEFLEX) 500 MG capsule Take 500 mg by  mouth 4 (four) times daily.   Yes [provider]  Cholecalciferol (VITAMIN D3) 5000 UNITS TABS Take 1 tablet by mouth every morning.   Yes [provider]  ELIQUIS 5 MG TABS tablet Take 1 tablet by mouth twice daily 03/04/19  Yes Stacks, [name], MD  furosemide (LASIX) 40 MG tablet TAKE 1 TABLET BY MOUTH TWICE DAILY AS DIRECTED Patient taking differently: Take 40-80 mg by mouth 2 (two) times daily. Patient takes 47m in the morning and 463min the evening 05/21/19  Yes Stacks, WaCletus GashMD  glucose blood (ACCU-CHEK AVIVA PLUS) test strip Test BS QID Dx E11.9 01/28/19  Yes Stacks, WaCletus GashMD  insulin NPH Human (NOVOLIN N RELION) 100 UNIT/ML injection Inject 0.55 mLs (55 Units total) into the skin 2 (two) times daily before a meal. Patient taking differently: Inject 50-55 Units into the skin 2 (two) times daily before a meal.  03/26/19  Yes [name], MD  insulin regular (NOVOLIN R) 100 units/mL injection Inject 15 units before breakfast and supper Patient taking differently: Inject 12-15 Units into the skin 2 (two) times daily before a meal. Inject 15 units before breakfast and supper 03/26/19  Yes Stacks, [name], MD  ipratropium-albuterol (DUONEB) 0.5-2.5 (3) MG/3ML SOLN Take 3 mLs by nebulization every 6 (six) hours as needed. 08/23/18 07/08/19 Yes Deneise Lever, MD  Lancet Devices (LANCING DEVICE) MISC USE AS DIRECTED 11/13/12  Yes Chipper Herb, MD  metoprolol tartrate (LOPRESSOR) 100 MG tablet Take 1 tablet by mouth twice daily 05/21/19  Yes Stacks, [name], MD  nystatin (MYCOSTATIN/NYSTOP) powder Apply topically 2 (two) times daily. Apply to groin area and scrotum skin; keep area clean and dry. 06/28/19  Yes Barton Dubois, MD  oxymetazoline (AFRIN) 0.05 % nasal spray Place 1 spray into both nostrils 2 (two) times daily as needed for congestion.   Yes [provider]  PARoxetine (PAXIL) 20 MG tablet Take 1 tablet by mouth once daily 05/21/19  Yes Stacks, [name], MD    Semaglutide, 1 MG/DOSE, (OZEMPIC, 1 MG/DOSE,) 2 MG/1.5ML SOPN Inject 1 mg into the skin once a week. 03/26/19  Yes [name], MD  simvastatin (ZOCOR) 40 MG tablet Take 1 tablet by mouth once daily 03/25/19  Yes Stacks, [name], MD  TRELEGY ELLIPTA 100-62.5-25 MCG/INH AEPB Take 1 puff by mouth daily. 06/29/19  Yes [provider]    Physical Exam: Vitals:   07/08/19 1532 07/08/19 1538 07/08/19 1630 07/08/19 1700  BP: 115/78  103/71 103/69  Pulse: 85 92    Resp: 20 (!) 26 (!) 25 (!) 25  Temp: 97.8 F (36.6 C)     TempSrc: Oral     SpO2: 99% 100%    Weight:  129.7 kg    Height:  _0  (1.753 m)      Constitutional: NAD, calm  Eyes: PERTLA, lids and conjunctivae normal ENMT: Mucous membranes are moist. Posterior pharynx clear of any exudate or lesions.   Neck: normal, supple, no masses, no thyromegaly Respiratory:  no wheezing, no crackles. No accessory muscle use.  Cardiovascular: Rate ~80 and irregularly irregular. Pretibial pitting edema bilaterally.    Abdomen: No distension, no tenderness, soft. Bowel sounds active.  Musculoskeletal: no clubbing / cyanosis. No joint deformity upper and lower extremities.   Skin: no significant rashes, lesions, ulcers. Warm, dry, well-perfused. Neurologic: No facial asymmetry. Sensation intact. Moving all extremities.  Psychiatric: Alert and oriented, appropriate throughout interview and exam. Calm and cooperative.    Labs and Imaging on Admission: I have personally reviewed following labs and imaging studies  CBC: Recent Labs  Lab 07/04/19 1408 07/08/19 1628  WBC 7.4 9.9  NEUTROABS 5.4 7.8*  HGB 12.1* 11.9*  HCT 36.6* 38.5*  MCV 92 95.1  PLT 301 629   Basic Metabolic Panel: Recent Labs  Lab 07/04/19 1408 07/08/19 1628  NA 140 137  K 4.2 4.0  CL 95* 91*  CO2 32* 36*  GLUCOSE 45* 98  BUN 23 28*  CREATININE 1.02 1.20  CALCIUM 9.2 8.9   GFR: Estimated Creatinine Clearance: 78.6 mL/min (by C-G formula based on SCr  of 1.2 mg/dL). Liver Function Tests: Recent Labs  Lab 07/04/19 1408 07/08/19 1628  AST 14 15  ALT 10 11  ALKPHOS 99 96  BILITOT 0.7 1.8*  PROT 6.6 7.4  ALBUMIN 3.6* 3.1*   No results for input(s): LIPASE, AMYLASE in the last 168 hours. No  results for input(s): AMMONIA in the last 168 hours. Coagulation Profile: No results for input(s): INR, PROTIME in the last 168 hours. Cardiac Enzymes: No results for input(s): CKTOTAL, CKMB, CKMBINDEX, TROPONINI in the last 168 hours. BNP (last 3 results) No results for input(s): PROBNP in the last 8760 hours. HbA1C: No results for input(s): HGBA1C in the last 72 hours. CBG: No results for input(s): GLUCAP in the last 168 hours. Lipid Profile: No results for input(s): CHOL, HDL, LDLCALC, TRIG, CHOLHDL, LDLDIRECT in the last 72 hours. Thyroid Function Tests: No results for input(s): TSH, T4TOTAL, FREET4, T3FREE, THYROIDAB in the last 72 hours. Anemia Panel: No results for input(s): VITAMINB12, FOLATE, FERRITIN, TIBC, IRON, RETICCTPCT in the last 72 hours. Urine analysis:    Component Value Date/Time   COLORURINE YELLOW 06/25/2019 1133   APPEARANCEUR CLEAR 06/25/2019 1133   APPEARANCEUR Clear 05/30/2018 1147   LABSPEC 1.026 06/25/2019 1133   PHURINE 5.0 06/25/2019 1133   GLUCOSEU >=500 (A) 06/25/2019 1133   HGBUR SMALL (A) 06/25/2019 1133   BILIRUBINUR NEGATIVE 06/25/2019 1133   BILIRUBINUR Negative 05/30/2018 1147   KETONESUR 20 (A) 06/25/2019 1133   PROTEINUR NEGATIVE 06/25/2019 1133   UROBILINOGEN negative 05/01/2013 1115   UROBILINOGEN 0.2 05/18/2010 0352   NITRITE NEGATIVE 06/25/2019 1133   LEUKOCYTESUR NEGATIVE 06/25/2019 1133   Sepsis Labs: _0 (procalcitonin:4,lacticidven:4) )No results found for this or any previous visit (from the past 240 hour(s)).   Radiological Exams on Admission: CT Angio Chest PE W/Cm &/Or Wo Cm  Result Date: 07/08/2019 CLINICAL DATA:  Shortness of breath EXAM: CT ANGIOGRAPHY CHEST WITH  CONTRAST TECHNIQUE: Multidetector CT imaging of the chest was performed using the standard protocol during bolus administration of intravenous contrast. Multiplanar CT image reconstructions and MIPs were obtained to evaluate the vascular anatomy. CONTRAST:  1103m OMNIPAQUE IOHEXOL 350 MG/ML SOLN COMPARISON:  CTA chest 11/08/2018 FINDINGS: Cardiovascular: Contrast injection is sufficient to demonstrate satisfactory opacification of the pulmonary arteries to the segmental level. There is no pulmonary embolus or evidence of right heart strain. The size of the main pulmonary artery is normal. There is a large pericardial effusion that measures 2.7 cm thickness, new since the prior scan of 11/08/2018. The course and caliber of the aorta are normal. There is mild atherosclerotic calcification. Opacification decreased due to pulmonary arterial phase contrast bolus timing. Mediastinum/Nodes: Multiple subcentimeter mediastinal lymph nodes. Lungs/Pleura: There are small pleural effusions, slightly larger on the right. Bibasilar atelectasis. Airways are patent. Upper Abdomen: Contrast bolus timing is not optimized for evaluation of the abdominal organs. There is cholelithiasis. No acute findings. Musculoskeletal: No chest wall abnormality. No bony spinal canal stenosis. Review of the MIP images confirms the above findings. IMPRESSION: 1. No pulmonary embolus. 2. Large pericardial effusion, new since the prior scan. 3. Small pleural effusions with associated bibasilar atelectasis. Electronically Signed   By: KUlyses JarredM.D.   On: 07/08/2019 19:18   DG Chest Port 1 View  Result Date: 07/08/2019 CLINICAL DATA:  Shortness of breath. EXAM: PORTABLE CHEST 1 VIEW COMPARISON:  July 04, 2019. FINDINGS: Stable cardiomegaly. Left lung base is not entirely included in field-of-view. Bibasilar opacities are noted concerning for atelectasis or infiltrates with possible effusions. No pneumothorax is noted. Bony thorax is  unremarkable. IMPRESSION: Bibasilar opacities are noted concerning for atelectasis or infiltrates with possible effusions. Exam is limited as left lung base is not entirely included in field-of-view. Electronically Signed   By: JMarijo ConceptionM.D.   On: 07/08/2019 16:37  EKG: Independently reviewed. Atrial fibrillation.   Assessment/Plan   1. Pericardial effusion  - Presents with increased dyspnea over the past 3 days and has CTA chest in ED that was negative for PE but notable for small pleural effusions and large pericardial effusion  - Cardiology contacted by ED physician and recommended admission to [facility] for echocardiogram and formal cardiology consultation in AM  - Patient is hemodynamically stable with no infectious s/s, not uremic   - Check TSH and inflammatory markers, check echocardiogram    2. COPD; chronic hypoxic respiratory failure  - No cough or wheezing  - Continue Trelegy, as-needed duonebs, supplemental O2    3. Chronic diastolic CHF  - Appears compensated  - TTE (06/26/19) with EF 55-60%, mild LVH, mildly reduced RV systolic function, severe LAE, and small circumferential pericardial effusion  - Continue Lasix    4. Insulin-dependent DM  - A1c was 9.1% earlier this month; serum glucose was 45 yesterday  - Check CBGs, continue insulin    5. Atrial fibrillation  - In rate-controlled a fib on admission  - Hold Eliquis pending echocardiogram; with large pericardial effusion, will hold beta-blocker initially pending echo    6. Anxiety  - Continue Paxil and as-needed Xanax     DVT prophylaxis: Eliquis pta, held on admission  Code Status: Full  Family Communication: Discussed with patient  Disposition Plan: Likely back home pending further evaluation and management of pericardial effusion  Consults called: ED physician discussed with cardiology  Admission status: Inpatient     Vianne Bulls, MD [facility] Pager: See [email or web address]  If  7AM-7PM, please contact the daytime attending [email or web address]  07/08/2019, 9:02 PM

## 2019-07-08 NOTE — ED Provider Notes (Signed)
[facility] EMERGENCY DEPARTMENT Provider Note   CSN: [id number] Arrival date & time: 07/08/19  1517     History Chief Complaint  Patient presents with  . Shortness of Breath    Justin Ruiz is a 69 y.o. male.  Patient presenting with about 2 to 3-day history of shortness of breath.  Has a known history of atrial fibrillation known history of congestive heart failure.  And a history of COPD.  March's second patient was admitted for rapid atrial fib.  And for some of the CHF.  Normally on 3 L of oxygen all the time.  At rest patient's oxygen levels are fine.  Is a little tachypneic.  Not tachycardic.  Patient is on Eliquis.  Patient denies any real chest discomfort.  Denies any significant leg swelling.        Past Medical History:  Diagnosis Date  . Acute diastolic heart failure ([place]) 03/28/2016  . Acute encephalopathy 03/28/2016  . Acute encephalopathy 03/28/2016  . Acute on chronic respiratory failure with hypoxia ([place]) 12/04/2016  . Arthritis   . Asthma   . Atrial fibrillation ([place])    on AC  . Benign neoplasm of ascending colon 05/05/2014  . BPH (benign prostatic hypertrophy)   . CARDIOVASCULAR STUDIES, ABNORMAL 04/27/2009   Qualifier: Diagnosis of  By: Percival Spanish, MD, Farrel Gordon    . Chronic anticoagulation 08/20/2012  . Chronic respiratory failure with hypoxia ([place]) 11/10/2017  . Colon polyps   . COPD mixed type ([place]) 03/28/2016   PFT 06/17/14- severe obstructive airways disease with slight response to bronchodilator. FVC 2.03/42%, FEV1 1.02/28%, ratio 0.5, TLC 82%, DLCO 59%  . Diabetes mellitus   . Diabetic neuropathy, type II diabetes mellitus ([place]) 09/25/2014  . Diabetic retinopathy ([place]) 05/28/2017  . 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 GERD (gastroesophageal reflux disease)   . HTN (hypertension)    x 3 years  . Hyperlipidemia   . Lung nodules 03/11/2013  . Mediastinal adenopathy 06/20/2013   CT  & PET 2/15   . Memory loss     . 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 NHL (non-Hodgkin's lymphoma) ([place])    nhl dx 9/11  . Obesity    exogenous  . Obstructive sleep apnea 05/25/2014   NPSG-  04/07/2014-severe obstructive sleep apnea-AHI 75.1 per hour. CPAP titrated to 16. He required supplemental oxygen at 2 L/ [employer] which he already wears for sleep at home. Weight 338 pounds   . Peripheral vascular insufficiency ([place]) 08/02/2017  . Personal history of colonic polyps 03/02/2009   Qualifier: Diagnosis of  By: Deatra Ina MD, Sandy Salaam   . Sepsis ([place]) 06/07/2015  . Severe obesity (BMI >= 40) ([place]) 05/01/2013  . Shingles   . Sleep apnea    CPAP machine is broken  . Thrombocytopenia ([place]) 11/23/2016    Patient Active Problem List   Diagnosis Date Noted  . Acute on chronic respiratory failure ([place]) 06/25/2019  . Acute on chronic diastolic CHF (congestive heart failure) ([place]) 06/25/2019  . Atrial fibrillation, chronic ([place]) 06/25/2019  . Uncontrolled type 2 diabetes mellitus with hyperglycemia ([place]) 06/25/2019  . General weakness   . Diabetic infection of right foot ([place]) 12/17/2018  . Chronic respiratory failure with hypoxia ([place]) 11/10/2017  . Peripheral vascular insufficiency ([place]) 08/02/2017  . Diabetic retinopathy ([place]) 05/28/2017  . Thrombocytopenia ([place]) 11/23/2016  . Memory loss 04/13/2016  . Hypomagnesemia   . Acute diastolic heart failure ([place]) 03/28/2016  .  COPD mixed type ([place]) 03/28/2016  . Atrial fibrillation with rapid ventricular response ([place]) 03/28/2016  . 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 Chronic diastolic heart failure ([place])   . Diabetic neuropathy, type II diabetes mellitus ([place]) 09/25/2014  . Obstructive sleep apnea 05/25/2014  . Benign neoplasm of ascending colon 05/05/2014  . Excessive daytime sleepiness 01/24/2014  . Mediastinal adenopathy 06/20/2013  . Generalized anxiety disorder 05/01/2013  . Lung nodules 03/11/2013  . NHL  (non-Hodgkin's lymphoma) ([place]) 01/26/2010  . Obesity, morbid ([place]) 04/27/2009  . ABNORMAL STRESS ELECTROCARDIOGRAM 03/25/2009  . Diabetes mellitus type 2, insulin dependent ([place]) 04/29/2007  . Hyperlipidemia 04/29/2007  . Hypertension 04/29/2007  . Seasonal and perennial allergic rhinitis 04/29/2007  . G E R D 04/29/2007  . BENIGN PROSTATIC HYPERTROPHY, HX OF 04/29/2007    Past Surgical History:  Procedure Laterality Date  . APPENDECTOMY    . COLONOSCOPY WITH PROPOFOL N/A 05/05/2014   Procedure: COLONOSCOPY WITH PROPOFOL;  Surgeon: Inda Castle, MD;  Location: [facility] ENDOSCOPY;  Service: Endoscopy;  Laterality: N/A;       Family History  Problem Relation Age of Onset  . Liver cancer Mother   . Diabetes Mother   . Heart disease Mother   . Colon cancer Father   . Prostate cancer Father   . Colon polyps Father   . Pulmonary embolism Sister   . Diabetes Sister   . Heart attack Sister   . Aortic aneurysm Sister   . COPD Sister   . Heart disease Sister   . COPD Sister   . Heart disease Sister   . Diabetes Maternal Grandmother     Social History   Tobacco Use  . Smoking status: Former Smoker    Packs/day: 1.00    Years: 40.00    Pack years: 40.00    Types: Cigarettes    Start date: 12/17/1968    Quit date: 10/18/2018    Years since quitting: 0.7  . Smokeless tobacco: Never Used  . Tobacco comment: 2 ppd; pt reports smoking 2-3 weeks ago, 5-6 cigs/daily  Substance Use Topics  . Alcohol use: No    Alcohol/week: 0.0 standard drinks    Comment: previous  . Drug use: No    Home Medications Prior to Admission medications   Medication Sig Start Date End Date Taking? Authorizing Provider  Accu-Chek FastClix Lancets MISC Test BS QID Dx E11.9 01/28/19  Yes [name], MD  acetaminophen (TYLENOL) 325 MG tablet Take 650 mg by mouth every 6 (six) hours as needed.   Yes [provider]  albuterol (VENTOLIN HFA) 108 (90 Base) MCG/ACT inhaler Inhale 2 puffs into the  lungs every 6 (six) hours as needed for wheezing or shortness of breath. 08/23/18  Yes Young, Tarri Fuller D, MD  Alcohol Swabs (B-D SINGLE USE SWABS REGULAR) PADS Test BS QID Dx E11.9 01/28/19  Yes Stacks, [name], MD  ALPRAZolam Duanne Moron) 0.25 MG tablet Take 1 tablet (0.25 mg total) by mouth 3 (three) times daily as needed. for anxiety 07/04/19  Yes Stacks, [name], MD  Blood Glucose Monitoring Suppl (ACCU-CHEK AVIVA PLUS) w/Device KIT Test BS QID DX E11.9 01/28/19  Yes Stacks, [name], MD  cephALEXin (KEFLEX) 500 MG capsule Take 500 mg by mouth 4 (four) times daily.   Yes [provider]  Cholecalciferol (VITAMIN D3) 5000 UNITS TABS Take 1 tablet by mouth every morning.   Yes [provider]  ELIQUIS 5 MG TABS tablet Take 1 tablet by mouth twice  daily 03/04/19  Yes Stacks, [name], MD  furosemide (LASIX) 40 MG tablet TAKE 1 TABLET BY MOUTH TWICE DAILY AS DIRECTED Patient taking differently: Take 40-80 mg by mouth 2 (two) times daily. Patient takes 18m in the morning and 413min the evening 05/21/19  Yes Stacks, WaCletus GashMD  glucose blood (ACCU-CHEK AVIVA PLUS) test strip Test BS QID Dx E11.9 01/28/19  Yes Stacks, WaCletus GashMD  insulin NPH Human (NOVOLIN N RELION) 100 UNIT/ML injection Inject 0.55 mLs (55 Units total) into the skin 2 (two) times daily before a meal. Patient taking differently: Inject 50-55 Units into the skin 2 (two) times daily before a meal.  03/26/19  Yes StClaretta FraiseMD  insulin regular (NOVOLIN R) 100 units/mL injection Inject 15 units before breakfast and supper Patient taking differently: Inject 12-15 Units into the skin 2 (two) times daily before a meal. Inject 15 units before breakfast and supper 03/26/19  Yes Stacks, WaCletus GashMD  ipratropium-albuterol (DUONEB) 0.5-2.5 (3) MG/3ML SOLN Take 3 mLs by nebulization every 6 (six) hours as needed. 08/23/18 07/08/19 Yes YoDeneise LeverMD  Lancet Devices (LANCING DEVICE) MISC USE AS DIRECTED 11/13/12  Yes MoChipper HerbMD    metoprolol tartrate (LOPRESSOR) 100 MG tablet Take 1 tablet by mouth twice daily 05/21/19  Yes Stacks, WaCletus GashMD  nystatin (MYCOSTATIN/NYSTOP) powder Apply topically 2 (two) times daily. Apply to groin area and scrotum skin; keep area clean and dry. 06/28/19  Yes MaBarton DuboisMD  oxymetazoline (AFRIN) 0.05 % nasal spray Place 1 spray into both nostrils 2 (two) times daily as needed for congestion.   Yes [provider]  PARoxetine (PAXIL) 20 MG tablet Take 1 tablet by mouth once daily 05/21/19  Yes Stacks, WaCletus GashMD  Semaglutide, 1 MG/DOSE, (OZEMPIC, 1 MG/DOSE,) 2 MG/1.5ML SOPN Inject 1 mg into the skin once a week. 03/26/19  Yes StClaretta FraiseMD  simvastatin (ZOCOR) 40 MG tablet Take 1 tablet by mouth once daily 03/25/19  Yes Stacks, WaCletus GashMD  TRELEGY ELLIPTA 100-62.5-25 MCG/INH AEPB Take 1 puff by mouth daily. 06/29/19  Yes [provider]    Allergies    Avapro [irbesartan] and Lipitor [atorvastatin calcium]  Review of Systems   Review of Systems  Constitutional: Negative for chills and fever.  HENT: Negative for congestion, rhinorrhea and sore throat.   Eyes: Negative for visual disturbance.  Respiratory: Positive for shortness of breath. Negative for cough.   Cardiovascular: Negative for chest pain and leg swelling.  Gastrointestinal: Negative for abdominal pain, diarrhea, nausea and vomiting.  Genitourinary: Negative for dysuria.  Musculoskeletal: Negative for back pain and neck pain.  Skin: Negative for rash.  Neurological: Negative for dizziness, light-headedness and headaches.  Hematological: Does not bruise/bleed easily.  Psychiatric/Behavioral: Negative for confusion.    Physical Exam Updated Vital Signs BP 103/69   Pulse 92   Temp 97.8 F (36.6 C) (Oral)   Resp (!) 25   Ht 1.753 m ('5\' 9"' )   Wt 129.7 kg   SpO2 100%   BMI 42.23 kg/m   Physical Exam Vitals and nursing note reviewed.  Constitutional:      Appearance: He is well-developed.   HENT:     Head: Normocephalic and atraumatic.  Eyes:     Conjunctiva/sclera: Conjunctivae normal.  Cardiovascular:     Rate and Rhythm: Normal rate and regular rhythm.     Heart sounds: No murmur.  Pulmonary:     Effort: Pulmonary effort is normal. No respiratory distress.  Breath sounds: Normal breath sounds.  Abdominal:     Palpations: Abdomen is soft.     Tenderness: There is no abdominal tenderness.  Musculoskeletal:     Cervical back: Normal range of motion and neck supple.  Skin:    General: Skin is warm and dry.  Neurological:     General: No focal deficit present.     Mental Status: He is alert and oriented to person, place, and time.     ED Results / Procedures / Treatments   Labs (all labs ordered are listed, but only abnormal results are displayed) Labs Reviewed  COMPREHENSIVE METABOLIC PANEL - Abnormal; Notable for the following components:      Result Value   Chloride 91 (*)    CO2 36 (*)    BUN 28 (*)    Albumin 3.1 (*)    Total Bilirubin 1.8 (*)    All other components within normal limits  CBC WITH DIFFERENTIAL/PLATELET - Abnormal; Notable for the following components:   RBC 4.05 (*)    Hemoglobin 11.9 (*)    HCT 38.5 (*)    Neutro Abs 7.8 (*)    All other components within normal limits  BRAIN NATRIURETIC PEPTIDE - Abnormal; Notable for the following components:   B Natriuretic Peptide 151.0 (*)    All other components within normal limits    EKG EKG Interpretation  Date/Time:  Monday July 08 2019 15:32:47 EDT Ventricular Rate:  91 PR Interval:    QRS Duration: 61 QT Interval:  436 QTC Calculation: 537 R Axis:   76 Text Interpretation: Atrial fibrillation Low voltage, precordial leads Borderline T abnormalities, diffuse leads ST elevation, consider inferior injury Prolonged QT interval Confirmed by Fredia Sorrow [phone]) on 07/08/2019 4:12:42 PM   Radiology CT Angio Chest PE W/Cm &/Or Wo Cm  Result Date: 07/08/2019 CLINICAL DATA:   Shortness of breath EXAM: CT ANGIOGRAPHY CHEST WITH CONTRAST TECHNIQUE: Multidetector CT imaging of the chest was performed using the standard protocol during bolus administration of intravenous contrast. Multiplanar CT image reconstructions and MIPs were obtained to evaluate the vascular anatomy. CONTRAST:  136m OMNIPAQUE IOHEXOL 350 MG/ML SOLN COMPARISON:  CTA chest 11/08/2018 FINDINGS: Cardiovascular: Contrast injection is sufficient to demonstrate satisfactory opacification of the pulmonary arteries to the segmental level. There is no pulmonary embolus or evidence of right heart strain. The size of the main pulmonary artery is normal. There is a large pericardial effusion that measures 2.7 cm thickness, new since the prior scan of 11/08/2018. The course and caliber of the aorta are normal. There is mild atherosclerotic calcification. Opacification decreased due to pulmonary arterial phase contrast bolus timing. Mediastinum/Nodes: Multiple subcentimeter mediastinal lymph nodes. Lungs/Pleura: There are small pleural effusions, slightly larger on the right. Bibasilar atelectasis. Airways are patent. Upper Abdomen: Contrast bolus timing is not optimized for evaluation of the abdominal organs. There is cholelithiasis. No acute findings. Musculoskeletal: No chest wall abnormality. No bony spinal canal stenosis. Review of the MIP images confirms the above findings. IMPRESSION: 1. No pulmonary embolus. 2. Large pericardial effusion, new since the prior scan. 3. Small pleural effusions with associated bibasilar atelectasis. Electronically Signed   By: KUlyses JarredM.D.   On: 07/08/2019 19:18   DG Chest Port 1 View  Result Date: 07/08/2019 CLINICAL DATA:  Shortness of breath. EXAM: PORTABLE CHEST 1 VIEW COMPARISON:  July 04, 2019. FINDINGS: Stable cardiomegaly. Left lung base is not entirely included in field-of-view. Bibasilar opacities are noted concerning for atelectasis or infiltrates with  possible effusions.  No pneumothorax is noted. Bony thorax is unremarkable. IMPRESSION: Bibasilar opacities are noted concerning for atelectasis or infiltrates with possible effusions. Exam is limited as left lung base is not entirely included in field-of-view. Electronically Signed   By: Marijo Conception M.D.   On: 07/08/2019 16:37    Procedures Procedures (including critical care time)  Medications Ordered in ED Medications  iohexol (OMNIPAQUE) 350 MG/ML injection 100 mL (100 mLs Intravenous Contrast Given 07/08/19 1819)    ED Course  I have reviewed the triage vital signs and the nursing notes.  Pertinent labs & imaging results that were available during my care of the patient were reviewed by me and considered in my medical decision making (see chart for details).    MDM Rules/Calculators/A&P                     CT angio chest was done for the complaint of shortness of breath for the past 3 days.  Plain x-ray without any definitive findings.  CT angio chest rules out pulmonary embolus however shows a large pericardial effusion.  Patient's BNP is 151.  EKG is a rate controlled atrial fibrillation patient has a known history of atrial fibrillation is on Eliquis.  At rest patient is a little tachypneic however oxygen saturations are good.  Patient is chronically on 3 L of oxygen.  Past medical history significant for the atrial fib COPD and history of CHF.  Specifically patient has acute diastolic heart failure.  Patient with recent admission March 2 for atrial fibrillation with rapid ventricular response.  Discuss[facility] with on[facility]ology [facility].  Patient during his admission March 2 had a small pericardial effusion now it is large.  Suspect this is the reason for his symptoms.  Spoken with hospitalist.  Cardiology said patient was okay to stay here overnight since he is hemodynamically stable.  And have cardiology see him in the morning have echo repeated.  May end up needing a pericardial window.  Hospitalist  will see patient and probably admit here.  Final Clinical Impression(s) / ED Diagnoses Final diagnoses:  None    Rx / DC Orders ED Discharge Orders    None       Fredia Sorrow, MD 07/08/19 2016

## 2019-07-08 NOTE — ED Triage Notes (Signed)
Patient complaining of SOB x3 days. Per EMS- all vitals within normal limits. CBG 172.

## 2019-07-08 NOTE — Telephone Encounter (Signed)
Spoke with pt's wife Justin Ruiz and she is concerned. Pt has been c/o SOB even with oxygen  3.5 L via [place], O2 sats drop in the low 80's at times even with pt just sitting in his chair. He isn't eating much but is hydrating well. Pt panics and wants xanax to help him breathe.   Yesterday, when you felt so badly in the office, I believe it was a result of your blood sugar dropping now that I see it was 45 when it was drawn yesterday.  As result, when you feel that panicky feeling you should start with checking your blood sugar and perhaps taking some sugar if it is below 80 prior to taking the Xanax for panic.  Please reserve the Xanax as a last resort.  Otherwise there was no significant abnormality noted on your blood work or chest x-ray.  This was in OV notes from 07/04/19 from Dr Livia Snellen so I went over this with the wife again to make sure she checks his BS when he starts to "panic" and she voiced understanding. She did say she wanted to take the pt to the ED last night but he refused. He is up eating cereal now with BS 87 and pulse ox at 92%. Advised to try and get pt to eat 6-7 small meals throughout the day just to keep his blood sugars up and remind him to take deep breaths through his nose. Advised if he starts to c/o SOB and pulse ox in the low 80's, or if he gets dizzy, lightheaded, to go back to ED. Pt's wife voiced understanding.

## 2019-07-08 NOTE — Chronic Care Management (AMB) (Signed)
Care Management Note   Justin Ruiz is a 69 y.o. year old male who is a primary care patient of Stacks, [name], MD. The CM team was consulted for assistance with chronic disease management and care coordination.   I reached out to [employer] spouse of client, by phone today.    Review of patient status, including review of consultants reports, relevant laboratory and other test results, and collaboration with appropriate care team members and the patient's provider was performed as part of comprehensive patient evaluation and provision of chronic care management services.   Social determinants of health: risk of social isolation; risk of tobacco use; risk of stress; risk of physical inactivity    Chronic Care Management from 03/04/2019 in [facility]  PHQ-9 Total Score  6     GAD 7 : Generalized Anxiety Score 03/04/2019  Nervous, Anxious, on Edge 1  Control/stop worrying 0  Worry too much - different things 0  Trouble relaxing 1  Restless 0  Easily annoyed or irritable 0  Afraid - awful might happen 1  Total GAD 7 Score 3  Anxiety Difficulty Somewhat difficult   Medications   Accu-Chek FastClix Lancets MISC acetaminophen (TYLENOL) 325 MG tablet albuterol (VENTOLIN HFA) 108 (90 Base) MCG/ACT inhaler Alcohol Swabs (B-D SINGLE USE SWABS REGULAR) PADS ALPRAZolam (XANAX) 0.25 MG tablet Blood Glucose Monitoring Suppl (ACCU-CHEK AVIVA PLUS) w/Device KIT Cholecalciferol (VITAMIN D3) 5000 UNITS TABS ELIQUIS 5 MG TABS tablet furosemide (LASIX) 40 MG tablet glucose blood (ACCU-CHEK AVIVA PLUS) test strip insulin NPH Human (NOVOLIN N RELION) 100 UNIT/ML injection insulin regular (NOVOLIN R) 100 units/mL injection ipratropium-albuterol (DUONEB) 0.5-2.5 (3) MG/3ML SOLN(Expired) Lancet Devices (LANCING DEVICE) MISC metoprolol tartrate (LOPRESSOR) 100 MG tablet mometasone-formoterol (DULERA) 200-5 MCG/ACT AERO nystatin (MYCOSTATIN/NYSTOP)  powder PARoxetine (PAXIL) 20 MG tablet Semaglutide, 1 MG/DOSE, (OZEMPIC, 1 MG/DOSE,) 2 MG/1.5ML SOPN simvastatin (ZOCOR) 40 MG tablet TRELEGY ELLIPTA 100-62.5-25 MCG/INH AEPB  Goals    . Client will talk with LCSW in next 30 days to discuss anxiety, stress issues of client (pt-stated)     Current Barriers:  . Anxiety issues in client with Chronic Diagnoses of GAD, COPD,DM Type 2, HLD, HTN, and GERD . Breathing issues . Irregular meal schedule  Clinical Social Work Clinical Goal(s):  . Client will talk with LCSW in next 30 days to discus anxiety and stress issues of client  Interventions:  Previously encouraged [name] and spouse to call RNCM to discuss nursing issues of client  Talked with [name] about client panic episodes/anxiety symptoms  Talked with [name] about mobility issues of client   Talked with [name] about oxygen use of client  Talked with [name] about medication costs for client  Talked with [name] about client weakness, tiredness upon exersion  Talked with [name] about current client needs [name] said client has gone back to ED at [facility] for evaluation)   Talked with [name] about medication use by client  Collaborated with [employer] about nursing needs of client  Patient Self Care Activities:  . Attends scheduled medical appointments . Takes medications as prescribed  Plan:    Client to attend scheduled medical appointments LCSW to call client or spouse of client  in next 4 weeks to talk with client about his anxiety and stress symptoms management.  Client or spouse to talk with RNCM as needed to discuss nursing needs  Initial goal documentation         Follow Up Plan: LCSW to call  client/spouse of client in next 4 weeks to talk with client/spouse about client anxiety or stress symptoms management  Norva Riffle.Arminda Foglio MSW, LCSW Licensed Clinical Social Worker [facility] Care Management [phone]

## 2019-07-08 NOTE — Telephone Encounter (Signed)
TC w/ Colletta [place] w/ [facility]  Pt having SOB, has anxiety does not feel like this is that, not hearing anything moving when listening to patient, he feels like his lungs are filling up with fluid. Respirations are rapid. Recommendation is to send pt to the ED, nurse agrees and family is calling EMS to transport patient

## 2019-07-08 NOTE — Patient Instructions (Addendum)
Licensed Clinical Social Worker Visit Information  Goals we discussed today:  Goals    . Client will talk with LCSW in next 30 days to discuss anxiety, stress issues of client (pt-stated)     Current Barriers:  . Anxiety issues in client with Chronic Diagnoses of GAD, COPD,DM Type 2, HLD, HTN, and GERD . Breathing issues . Irregular meal schedule  Clinical Social Work Clinical Goal(s):  . Client will talk with LCSW in next 30 days to discus anxiety and stress issues of client  Interventions:    Previously encouraged Justin Ruiz and spouse to call RNCM to discuss nursing issues of client  Talked with Justin Ruiz about client panic episodes/anxiety symptoms  Talked with Justin Ruiz about mobility issues of client   Talked with Justin Ruiz about oxygen use of client  Talked with Justin Ruiz about medication costs for client  Talked with Justin Ruiz about client weakness, tiredness upon exersion  Talked with Justin Ruiz about current client needs Justin Ruiz said client has gone back to ED at [facility] for evaluation)   Talked with Justin Ruiz about medication use by client  Collaborated with [facility] about nursing needs of client  Patient Self Care Activities:  . Attends scheduled medical appointments . Takes medications as prescribed  Plan:    Client to attend scheduled medical appointments LCSW to call client or spouse of client  in next 4 weeks to talk with client about his anxiety and stress symptoms management.  Client or spouse to talk with RNCM as needed to discuss nursing needs  Initial goal documentation       Materials Provided: No  Follow Up Plan:  LCSW to call client or spouse of client in next 4 weeks to talk with client or spouse about client management of anxiety or stress symptoms  The patient/Justin Ruiz spouse,  verbalized understanding of instructions provided today and declined a print copy of patient instruction materials.   Justin Ruiz.Justin Ruiz MSW, LCSW Licensed  Clinical Social Worker [facility] Care Management [phone]

## 2019-07-09 ENCOUNTER — Other Ambulatory Visit: Payer: Self-pay

## 2019-07-09 ENCOUNTER — Inpatient Hospital Stay (HOSPITAL_COMMUNITY): Payer: Medicare Other

## 2019-07-09 DIAGNOSIS — G4733 Obstructive sleep apnea (adult) (pediatric): Secondary | ICD-10-CM

## 2019-07-09 DIAGNOSIS — I5033 Acute on chronic diastolic (congestive) heart failure: Secondary | ICD-10-CM

## 2019-07-09 DIAGNOSIS — J449 Chronic obstructive pulmonary disease, unspecified: Secondary | ICD-10-CM

## 2019-07-09 DIAGNOSIS — I309 Acute pericarditis, unspecified: Principal | ICD-10-CM

## 2019-07-09 DIAGNOSIS — J9611 Chronic respiratory failure with hypoxia: Secondary | ICD-10-CM

## 2019-07-09 DIAGNOSIS — I313 Pericardial effusion (noninflammatory): Secondary | ICD-10-CM

## 2019-07-09 DIAGNOSIS — I4821 Permanent atrial fibrillation: Secondary | ICD-10-CM

## 2019-07-09 DIAGNOSIS — E1165 Type 2 diabetes mellitus with hyperglycemia: Secondary | ICD-10-CM

## 2019-07-09 LAB — COMPREHENSIVE METABOLIC PANEL
ALT: 12 U/L (ref 0–44)
AST: 14 U/L — ABNORMAL LOW (ref 15–41)
Albumin: 2.9 g/dL — ABNORMAL LOW (ref 3.5–5.0)
Alkaline Phosphatase: 95 U/L (ref 38–126)
Anion gap: 9 (ref 5–15)
BUN: 28 mg/dL — ABNORMAL HIGH (ref 8–23)
CO2: 36 mmol/L — ABNORMAL HIGH (ref 22–32)
Calcium: 8.7 mg/dL — ABNORMAL LOW (ref 8.9–10.3)
Chloride: 88 mmol/L — ABNORMAL LOW (ref 98–111)
Creatinine, Ser: 1.13 mg/dL (ref 0.61–1.24)
GFR calc Af Amer: >60 mL/min (ref >60)
GFR calc non Af Amer: >60 mL/min (ref >60)
Glucose, Bld: 248 mg/dL — ABNORMAL HIGH (ref 70–99)
Potassium: 4.2 mmol/L (ref 3.5–5.1)
Sodium: 133 mmol/L — ABNORMAL LOW (ref 135–145)
Total Bilirubin: 1.5 mg/dL — ABNORMAL HIGH (ref 0.3–1.2)
Total Protein: 7 g/dL (ref 6.5–8.1)

## 2019-07-09 LAB — SARS CORONAVIRUS 2 (TAT 6-24 HRS): SARS Coronavirus 2: NEGATIVE

## 2019-07-09 LAB — CBC WITH DIFFERENTIAL/PLATELET
Abs Immature Granulocytes: 0.04 10*3/uL (ref 0.00–0.07)
Basophils Absolute: 0 10*3/uL (ref 0.0–0.1)
Basophils Relative: 0 %
Eosinophils Absolute: 0.1 10*3/uL (ref 0.0–0.5)
Eosinophils Relative: 1 %
HCT: 36.7 % — ABNORMAL LOW (ref 39.0–52.0)
Hemoglobin: 11.4 g/dL — ABNORMAL LOW (ref 13.0–17.0)
Immature Granulocytes: 0 %
Lymphocytes Relative: 10 %
Lymphs Abs: 1.1 10*3/uL (ref 0.7–4.0)
MCH: 29.8 pg (ref 26.0–34.0)
MCHC: 31.1 g/dL (ref 30.0–36.0)
MCV: 95.8 fL (ref 80.0–100.0)
Monocytes Absolute: 0.9 10*3/uL (ref 0.1–1.0)
Monocytes Relative: 9 %
Neutro Abs: 8.2 10*3/uL — ABNORMAL HIGH (ref 1.7–7.7)
Neutrophils Relative %: 80 %
Platelets: 267 10*3/uL (ref 150–400)
RBC: 3.83 MIL/uL — ABNORMAL LOW (ref 4.22–5.81)
RDW: 13.1 % (ref 11.5–15.5)
WBC: 10.4 10*3/uL (ref 4.0–10.5)
nRBC: 0 % (ref 0.0–0.2)

## 2019-07-09 LAB — ECHOCARDIOGRAM LIMITED
Height: 69 in
Weight: 4740.77 oz

## 2019-07-09 LAB — GLUCOSE, CAPILLARY
Glucose-Capillary: 164 mg/dL — ABNORMAL HIGH (ref 70–99)
Glucose-Capillary: 191 mg/dL — ABNORMAL HIGH (ref 70–99)
Glucose-Capillary: 198 mg/dL — ABNORMAL HIGH (ref 70–99)
Glucose-Capillary: 201 mg/dL — ABNORMAL HIGH (ref 70–99)
Glucose-Capillary: 206 mg/dL — ABNORMAL HIGH (ref 70–99)
Glucose-Capillary: 241 mg/dL — ABNORMAL HIGH (ref 70–99)
Glucose-Capillary: 246 mg/dL — ABNORMAL HIGH (ref 70–99)

## 2019-07-09 MED ORDER — INSULIN ASPART 100 UNIT/ML ~~LOC~~ SOLN
0.0000 [IU] | Freq: Three times a day (TID) | SUBCUTANEOUS | Status: DC
  Administered 2019-07-09 (×2): 5 [IU] via SUBCUTANEOUS
  Administered 2019-07-09: 3 [IU] via SUBCUTANEOUS
  Administered 2019-07-10: 11 [IU] via SUBCUTANEOUS
  Administered 2019-07-10: 3 [IU] via SUBCUTANEOUS

## 2019-07-09 MED ORDER — FUROSEMIDE 10 MG/ML IJ SOLN
40.0000 mg | Freq: Two times a day (BID) | INTRAMUSCULAR | Status: DC
  Administered 2019-07-09 – 2019-07-10 (×3): 40 mg via INTRAVENOUS
  Filled 2019-07-09 (×2): qty 4

## 2019-07-09 MED ORDER — PERFLUTREN LIPID MICROSPHERE
1.0000 mL | INTRAVENOUS | Status: AC | PRN
  Administered 2019-07-09: 1 mL via INTRAVENOUS
  Filled 2019-07-09: qty 10

## 2019-07-09 MED ORDER — INSULIN ASPART 100 UNIT/ML ~~LOC~~ SOLN
0.0000 [IU] | Freq: Every day | SUBCUTANEOUS | Status: DC
  Administered 2019-07-09: 2 [IU] via SUBCUTANEOUS

## 2019-07-09 MED ORDER — METOPROLOL TARTRATE 50 MG PO TABS
100.0000 mg | ORAL_TABLET | Freq: Two times a day (BID) | ORAL | Status: DC
  Administered 2019-07-09 – 2019-07-10 (×2): 100 mg via ORAL
  Filled 2019-07-09 (×3): qty 2

## 2019-07-09 MED ORDER — APIXABAN 5 MG PO TABS
5.0000 mg | ORAL_TABLET | Freq: Two times a day (BID) | ORAL | Status: DC
  Administered 2019-07-09 – 2019-07-10 (×3): 5 mg via ORAL
  Filled 2019-07-09 (×4): qty 1

## 2019-07-09 NOTE — Consult Note (Addendum)
Cardiology Consult    Patient ID: Justin Ruiz; [id number]; 1950/08/04   Admit date: 07/08/2019 Date of Consult: 07/09/2019  Primary Care Provider: [name], MD Primary Cardiologist: Minus Breeding, MD   Patient Profile    Justin Ruiz is a 69 y.o. male with past medical history of permanent atrial fibrillation, chronic diastolic CHF, HTN, HLD, IDDM, obesity, OSA and COPD with chronic hypoxic respiratory failure (on 3-4L [place] at baseline) who is being seen today for the evaluation of a pericardial effusion at the request of Dr. Carles Collet.   History of Present Illness    Justin Ruiz was last examined by Dr. Percival Spanish in 02/2019 and denied any recent chest pain or palpitations at that time and his breathing had overall been stable. He was continued on his current cardiac medications including Eliquis 46m BID, Lasix 476mBID, Lopressor 1009mID and Simvastatin 66m61mily.   [facility] for acute on chronic hypoxic respiratory failure in the setting of a CHF exacerbation. It was thought dietary indiscretion was playing a role and he responded well to IV Lasix and was transitioned back to Lasix 66mg27m at discharge (discharge weight of 294.5 lbs). Echo that admission showed a preserved EF of 55-60% with mild LVH and mildly reduced RV function with a small pericardial effusion noted. He did have atrial fibrillation with RVR upon arrival but rates improved with diuresis and PTA Lopressor was resumed.   He presented back to the ED on 07/08/2019 for evaluation of worsening dyspnea for the past 3 days. He reports having dyspnea on exertion as well as orthopnea. Not overly active at baseline and says the most active thing he has done since returning home is walking around the house to the restroom and kitchen. Has been sleeping in a recliner for years and says he typically sleeps with his O2 instead of CPAP. He denies any chest pain or palpitations. Lower extremity  edema has been stable per his report and weight on his home scales was in the mid 280's per his report but is elevated at 293 lbs on the hospital scales.   Initial labs show WBC 9.9, Hgb 11.9, platelets 287, Na+ 137, K+ 4.0 and creatinine 1.20 (baseline 1.1 - 1.3). BNP 151 (elevated to 1520 last admission). TSH 1.256. CRP 12.3. Sed rate 65. COVID testing pending. CXR showed bibasilar opacities concerning for atelectasis or infiltrates with effusions. CTA showed no evidence of a PE but he was noted to have a large pericardial effusion and small pleural effusions. EKG shows rate-controlled atrial fibrillation, HR 91 with low-voltage and nonspecific IVCD.   Lopressor was held at the time of admission given his effusion. Eliquis also held in case invasive measures are indicated. Telemetry shows atrial fibrillation, HR mostly in the 70's to 80's but peaking into 120's at times.    Past Medical History:  Diagnosis Date  . Acute diastolic heart failure (HCC) [place]4/2017  . Acute encephalopathy 03/28/2016  . Acute encephalopathy 03/28/2016  . Acute on chronic respiratory failure with hypoxia (HCC) [place]2/2018  . Arthritis   . Asthma   . Atrial fibrillation (HCC) [place] [facility]  . Benign neoplasm of ascending colon 05/05/2014  . BPH (benign prostatic hypertrophy)   . CARDIOVASCULAR STUDIES, ABNORMAL 04/27/2009   Qualifier: Diagnosis of  By: HochrPercival Spanish FACC,Farrel Gordon Chronic anticoagulation 08/20/2012  . Chronic respiratory failure with hypoxia (HCC) [place]9/2019  . Colon  polyps   . COPD mixed type ([place]) 03/28/2016   PFT 06/17/14- severe obstructive airways disease with slight response to bronchodilator. FVC 2.03/42%, FEV1 1.02/28%, ratio 0.5, TLC 82%, DLCO 59%  . Diabetes mellitus   . Diabetic neuropathy, type II diabetes mellitus ([place]) 09/25/2014  . Diabetic retinopathy ([place]) 05/28/2017  . 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 GERD (gastroesophageal reflux disease)   . HTN  (hypertension)    x 3 years  . Hyperlipidemia   . Lung nodules 03/11/2013  . Mediastinal adenopathy 06/20/2013   CT  & PET 2/15   . Memory loss   . 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 NHL (non-Hodgkin's lymphoma) ([place])    nhl dx 9/11  . Obesity    exogenous  . Obstructive sleep apnea 05/25/2014   NPSG-  04/07/2014-severe obstructive sleep apnea-AHI 75.1 per hour. CPAP titrated to 16. He required supplemental oxygen at 2 L/ [employer] which he already wears for sleep at home. Weight 338 pounds   . Peripheral vascular insufficiency ([place]) 08/02/2017  . Personal history of colonic polyps 03/02/2009   Qualifier: Diagnosis of  By: Deatra Ina MD, Sandy Salaam   . Sepsis ([place]) 06/07/2015  . Severe obesity (BMI >= 40) ([place]) 05/01/2013  . Shingles   . Sleep apnea    CPAP machine is broken  . Thrombocytopenia ([place]) 11/23/2016    Past Surgical History:  Procedure Laterality Date  . APPENDECTOMY    . COLONOSCOPY WITH PROPOFOL N/A 05/05/2014   Procedure: COLONOSCOPY WITH PROPOFOL;  Surgeon: Inda Castle, MD;  Location: [facility] ENDOSCOPY;  Service: Endoscopy;  Laterality: N/A;     Home Medications:  Prior to Admission medications   Medication Sig Start Date End Date Taking? Authorizing Provider  Accu-Chek FastClix Lancets MISC Test BS QID Dx E11.9 01/28/19  Yes [name], MD  acetaminophen (TYLENOL) 325 MG tablet Take 650 mg by mouth every 6 (six) hours as needed.   Yes [provider]  albuterol (VENTOLIN HFA) 108 (90 Base) MCG/ACT inhaler Inhale 2 puffs into the lungs every 6 (six) hours as needed for wheezing or shortness of breath. 08/23/18  Yes Young, Tarri Fuller D, MD  Alcohol Swabs (B-D SINGLE USE SWABS REGULAR) PADS Test BS QID Dx E11.9 01/28/19  Yes Stacks, [name], MD  ALPRAZolam Duanne Moron) 0.25 MG tablet Take 1 tablet (0.25 mg total) by mouth 3 (three) times daily as needed. for anxiety 07/04/19  Yes Stacks, [name], MD  Blood Glucose Monitoring Suppl (ACCU-CHEK AVIVA PLUS)  w/Device KIT Test BS QID DX E11.9 01/28/19  Yes Stacks, [name], MD  cephALEXin (KEFLEX) 500 MG capsule Take 500 mg by mouth 4 (four) times daily.   Yes [provider]  Cholecalciferol (VITAMIN D3) 5000 UNITS TABS Take 1 tablet by mouth every morning.   Yes [provider]  ELIQUIS 5 MG TABS tablet Take 1 tablet by mouth twice daily 03/04/19  Yes Stacks, [name], MD  furosemide (LASIX) 40 MG tablet TAKE 1 TABLET BY MOUTH TWICE DAILY AS DIRECTED Patient taking differently: Take 40-80 mg by mouth 2 (two) times daily. Patient takes 47m in the morning and 450min the evening 05/21/19  Yes Stacks, WaCletus GashMD  glucose blood (ACCU-CHEK AVIVA PLUS) test strip Test BS QID Dx E11.9 01/28/19  Yes Stacks, WaCletus GashMD  insulin NPH Human (NOVOLIN N RELION) 100 UNIT/ML injection Inject 0.55 mLs (55 Units total) into the skin 2 (two) times daily before a meal. Patient taking  differently: Inject 50-55 Units into the skin 2 (two) times daily before a meal.  03/26/19  Yes Stacks, [name], MD  insulin regular (NOVOLIN R) 100 units/mL injection Inject 15 units before breakfast and supper Patient taking differently: Inject 12-15 Units into the skin 2 (two) times daily before a meal. Inject 15 units before breakfast and supper 03/26/19  Yes Stacks, [name], MD  ipratropium-albuterol (DUONEB) 0.5-2.5 (3) MG/3ML SOLN Take 3 mLs by nebulization every 6 (six) hours as needed. 08/23/18 07/08/19 Yes Deneise Lever, MD  Lancet Devices (LANCING DEVICE) MISC USE AS DIRECTED 11/13/12  Yes Chipper Herb, MD  metoprolol tartrate (LOPRESSOR) 100 MG tablet Take 1 tablet by mouth twice daily 05/21/19  Yes Stacks, [name], MD  nystatin (MYCOSTATIN/NYSTOP) powder Apply topically 2 (two) times daily. Apply to groin area and scrotum skin; keep area clean and dry. 06/28/19  Yes Barton Dubois, MD  oxymetazoline (AFRIN) 0.05 % nasal spray Place 1 spray into both nostrils 2 (two) times daily as needed for congestion.   Yes [provider]  PARoxetine (PAXIL) 20 MG tablet Take 1 tablet by mouth once daily 05/21/19  Yes Stacks, [name], MD  Semaglutide, 1 MG/DOSE, (OZEMPIC, 1 MG/DOSE,) 2 MG/1.5ML SOPN Inject 1 mg into the skin once a week. 03/26/19  Yes [name], MD  simvastatin (ZOCOR) 40 MG tablet Take 1 tablet by mouth once daily 03/25/19  Yes Stacks, [name], MD  TRELEGY ELLIPTA 100-62.5-25 MCG/INH AEPB Take 1 puff by mouth daily. 06/29/19  Yes [provider]    Inpatient Medications: Scheduled Meds: . fluticasone furoate-vilanterol  1 puff Inhalation Daily   And  . umeclidinium bromide  1 puff Inhalation Daily  . furosemide  40 mg Intravenous BID  . insulin aspart  0-15 Units Subcutaneous TID WC  . insulin aspart  0-5 Units Subcutaneous QHS  . PARoxetine  20 mg Oral Daily  . simvastatin  40 mg Oral q1800  . sodium chloride flush  3 mL Intravenous Q12H  . sodium chloride flush  3 mL Intravenous Q12H   Continuous Infusions: . sodium chloride     PRN Meds: sodium chloride, acetaminophen **OR** acetaminophen, ALPRAZolam, HYDROcodone-acetaminophen, ipratropium-albuterol, senna-docusate, sodium chloride flush  Allergies:    Allergies  Allergen Reactions  . Avapro [Irbesartan] Other (See Comments)    "doesnt sit right with me"--light headed  . Lipitor [Atorvastatin Calcium] Other (See Comments)    Leg pain    Social History:   Social History   Socioeconomic History  . Marital status: Married    Spouse name: Not on file  . Number of children: 0  . Years of education: 69  . Highest education level: Not on file  Occupational History  . Occupation: Disabled  Tobacco Use  . Smoking status: Former Smoker    Packs/day: 1.00    Years: 40.00    Pack years: 40.00    Types: Cigarettes    Start date: 12/17/1968    Quit date: 10/18/2018    Years since quitting: 0.7  . Smokeless tobacco: Never Used  . Tobacco comment: 2 ppd; pt reports smoking 2-3 weeks ago, 5-6 cigs/daily  Substance and  Sexual Activity  . Alcohol use: No    Alcohol/week: 0.0 standard drinks    Comment: previous  . Drug use: No  . Sexual activity: Not Currently  Other Topics Concern  . Not on file  Social History Narrative   Married, disabled Dealer (COPD). Daily caffeine use - 2 cup/day.   Right-handed.  Social Determinants of Health   Financial Resource Strain: Low Risk   . Difficulty of Paying Living Expenses: Not very hard  Food Insecurity: No Food Insecurity  . Worried About Charity fundraiser in the Last Year: Never true  . Ran Out of Food in the Last Year: Never true  Transportation Needs: No Transportation Needs  . Lack of Transportation (Medical): No  . Lack of Transportation (Non-Medical): No  Physical Activity: Inactive  . Days of Exercise per Week: 0 days  . Minutes of Exercise per Session: 0 min  Stress: Stress Concern Present  . Feeling of Stress : Rather much  Social Connections: Moderately Isolated  . Frequency of Communication with Friends and Family: Once a week  . Frequency of Social Gatherings with Friends and Family: Never  . Attends Religious Services: Never  . Active Member of Clubs or Organizations: No  . Attends Archivist Meetings: Never  . Marital Status: Married  Human resources officer Violence: Not At Risk  . Fear of Current or Ex-Partner: No  . Emotionally Abused: No  . Physically Abused: No  . Sexually Abused: No     Family History:    Family History  Problem Relation Age of Onset  . Liver cancer Mother   . Diabetes Mother   . Heart disease Mother   . Colon cancer Father   . Prostate cancer Father   . Colon polyps Father   . Pulmonary embolism Sister   . Diabetes Sister   . Heart attack Sister   . Aortic aneurysm Sister   . COPD Sister   . Heart disease Sister   . COPD Sister   . Heart disease Sister   . Diabetes Maternal Grandmother       Review of Systems    General:  No chills, fever, night sweats or weight changes.    Cardiovascular:  No chest pain, palpitations, paroxysmal nocturnal dyspnea. Positive for dyspnea on exertion, edema and orthopnea.  Dermatological: No rash, lesions/masses Respiratory: No cough, Positive for dyspnea. Urologic: No hematuria, dysuria Abdominal:   No nausea, vomiting, diarrhea, bright red blood per rectum, melena, or hematemesis Neurologic:  No visual changes, wkns, changes in mental status. All other systems reviewed and are otherwise negative except as noted above.  Physical Exam/Data    Vitals:   07/09/19 0304 07/09/19 0440 07/09/19 0836 07/09/19 0839  BP:  105/78    Pulse:  81    Resp: 14 20    Temp:  98.4 F (36.9 C)    TempSrc:  Oral    SpO2: 97% 100% 98% 98%  Weight:  134.4 kg    Height:        Intake/Output Summary (Last 24 hours) at 07/09/2019 0903 Last data filed at 07/09/2019 0727 Gross per 24 hour  Intake 480 ml  Output 400 ml  Net 80 ml   Filed Weights   07/08/19 1538 07/08/19 2149 07/09/19 0440  Weight: 129.7 kg 131.9 kg 134.4 kg   Body mass index is 43.76 kg/m.   General: Pleasant obese male appearing in NAD Psych: Normal affect. Neuro: Alert and oriented X 3. Moves all extremities spontaneously. HEENT: Normal  Neck: Supple without bruits. JVD at 8 cm. Lungs:  Resp regular and unlabored, decreased along bases bilaterally. On 3L.  Heart: Irregularly irregular. no s3, s4, or murmurs. Abdomen: Soft, non-tender, non-distended, BS + x 4.  Extremities: No clubbing or cyanosis. 1+ pitting edema bilaterally. DP/PT/Radials 2+ and equal bilaterally.  EKG:  The EKG was personally reviewed and demonstrates: Rate-controlled atrial fibrillation, HR 91 with low-voltage and nonspecific IVCD.   Telemetry:  Telemetry was personally reviewed and demonstrates: Atrial fibrillation, HR mostly in the 70's to 80's but peaking into 120's at times.    Labs/Studies     Relevant CV Studies:  Echocardiogram: 06/2019 IMPRESSIONS    1. Left ventricular  ejection fraction, by estimation, is 55 to 60%. The  left ventricle has normal function. The left ventricle has no regional  wall motion abnormalities. There is mild left ventricular hypertrophy.  Left ventricular diastolic parameters  are indeterminate.  2. Right ventricular systolic function is mildly reduced. The right  ventricular size is mildly enlarged. There is mildly elevated pulmonary  artery systolic pressure.  3. Left atrial size was severely dilated.  4. The mitral valve is normal in structure and function. No evidence of  mitral valve regurgitation. No evidence of mitral stenosis.  5. The aortic valve has an indeterminant number of cusps. Aortic valve  regurgitation is not visualized. No aortic stenosis is present.  6. The inferior vena cava is normal in size with greater than 50%  respiratory variability, suggesting right atrial pressure of 3 mmHg.   Laboratory Data:  Chemistry Recent Labs  Lab 07/04/19 1408 07/08/19 1628 07/09/19 0506  NA 140 137 133*  K 4.2 4.0 4.2  CL 95* 91* 88*  CO2 32* 36* 36*  GLUCOSE 45* 98 248*  BUN 23 28* 28*  CREATININE 1.02 1.20 1.13  CALCIUM 9.2 8.9 8.7*  GFRNONAA 75 >60 >60  GFRAA 87 >60 >60  ANIONGAP  --  10 9    Recent Labs  Lab 07/04/19 1408 07/08/19 1628 07/09/19 0506  PROT 6.6 7.4 7.0  ALBUMIN 3.6* 3.1* 2.9*  AST 14 15 14*  ALT '10 11 12  ' ALKPHOS 99 96 95  BILITOT 0.7 1.8* 1.5*   Hematology Recent Labs  Lab 07/04/19 1408 07/08/19 1628 07/09/19 0506  WBC 7.4 9.9 10.4  RBC 3.99* 4.05* 3.83*  HGB 12.1* 11.9* 11.4*  HCT 36.6* 38.5* 36.7*  MCV 92 95.1 95.8  MCH 30.3 29.4 29.8  MCHC 33.1 30.9 31.1  RDW 12.9 13.1 13.1  PLT 301 287 267   Cardiac EnzymesNo results for input(s): TROPONINI in the last 168 hours. No results for input(s): TROPIPOC in the last 168 hours.  BNP Recent Labs  Lab 07/04/19 1408 07/08/19 1628  BNP 159.0* 151.0*    DDimer No results for input(s): DDIMER in the last 168  hours.  Radiology/Studies:  CT Angio Chest PE W/Cm &/Or Wo Cm  Result Date: 07/08/2019 CLINICAL DATA:  Shortness of breath EXAM: CT ANGIOGRAPHY CHEST WITH CONTRAST TECHNIQUE: Multidetector CT imaging of the chest was performed using the standard protocol during bolus administration of intravenous contrast. Multiplanar CT image reconstructions and MIPs were obtained to evaluate the vascular anatomy. CONTRAST:  171m OMNIPAQUE IOHEXOL 350 MG/ML SOLN COMPARISON:  CTA chest 11/08/2018 FINDINGS: Cardiovascular: Contrast injection is sufficient to demonstrate satisfactory opacification of the pulmonary arteries to the segmental level. There is no pulmonary embolus or evidence of right heart strain. The size of the main pulmonary artery is normal. There is a large pericardial effusion that measures 2.7 cm thickness, new since the prior scan of 11/08/2018. The course and caliber of the aorta are normal. There is mild atherosclerotic calcification. Opacification decreased due to pulmonary arterial phase contrast bolus timing. Mediastinum/Nodes: Multiple subcentimeter mediastinal lymph nodes. Lungs/Pleura: There are small pleural effusions, slightly  larger on the right. Bibasilar atelectasis. Airways are patent. Upper Abdomen: Contrast bolus timing is not optimized for evaluation of the abdominal organs. There is cholelithiasis. No acute findings. Musculoskeletal: No chest wall abnormality. No bony spinal canal stenosis. Review of the MIP images confirms the above findings. IMPRESSION: 1. No pulmonary embolus. 2. Large pericardial effusion, new since the prior scan. 3. Small pleural effusions with associated bibasilar atelectasis. Electronically Signed   By: Ulyses Jarred M.D.   On: 07/08/2019 19:18   DG Chest Port 1 View  Result Date: 07/08/2019 CLINICAL DATA:  Shortness of breath. EXAM: PORTABLE CHEST 1 VIEW COMPARISON:  July 04, 2019. FINDINGS: Stable cardiomegaly. Left lung base is not entirely included in  field-of-view. Bibasilar opacities are noted concerning for atelectasis or infiltrates with possible effusions. No pneumothorax is noted. Bony thorax is unremarkable. IMPRESSION: Bibasilar opacities are noted concerning for atelectasis or infiltrates with possible effusions. Exam is limited as left lung base is not entirely included in field-of-view. Electronically Signed   By: Marijo Conception M.D.   On: 07/08/2019 16:37     Assessment & Plan    1. Pericardial Effusion - presents with worsening dyspnea on exertion and orthopnea. No chest pain or palpitations. Echo last admission showed a small effusion and CT on 07/08/2019 showed a large pericardial effusion measuring 2.7 cm thickness. CRP elevated to 12.3 and Sed rate 65.  - communicated with the echo tech and will plan for a repeat limited study this AM for further assessment. No evidence of tamponade on examination. If large, would likely require a pericardiocentesis but if small to moderate, would favor close monitoring with IV diuretics. Eliquis was held on admission with his last dose being in the AM on 07/08/2019. Will start IV Lasix 52m BID given his volume overload.   2. Chronic Diastolic CHF - BNP 1660this admission (elevated to 1520 last admission) and CTA showed small pleural effusions.  - he does have pitting edema on examination but he reports weight had been stable in the mid 280's on his home scales and his current weight of 293 lbs is similar to his discharge weight of 294 lbs on 06/28/2019. - given his effusion, will start IV Lasix 413mBID. Follow I&O's along with daily weights. If did have an AKI last admission so will need to follow renal function closely with diuresis.   3.Permanent Atrial Fibrillation - rates have overall been well-controlled since admission but PTA Lopressor 100109mID is currently held due to his pericardial effusion. If the effusion is not large by echo, would plan to restart.  - on Eliquis 5mg7mD PTA which is  held in case he requires invasive procedures.   4. IDDM - Hgb A1c elevated to 9.1 when checked earlier this month. Further management per admitting team. I did communicate with his nurse he will remain NPO until echo results are available as he did have a hypoglycemic event overnight.  5. Chronic Hypoxic Respiratory Failure - on 3L [place] with saturations currently in the 90's.      For questions or updates, please contact [facility] consult [email or web address] for contact info under Cardiology/STEMI.  Signed, BritErma Heritage-C 07/09/2019, 9:03 AM Pager: [phone] patient was seen and examined, and I agree with the history, physical exam, assessment and plan as documented above, with modifications made above and as noted below. I have also personally reviewed all relevant documentation, old records, labs, and both radiographic and cardiovascular studies. I  have also independently interpreted old and new ECG's.  Briefly, this is a 69 year old male with a history of permanent atrial fibrillation, morbid obesity, COPD, obstructive sleep apnea, and chronic diastolic heart failure.  He is oxygen dependent and uses 3 to 4 L by nasal cannula on a regular basis.  He was recently hospitalized earlier this month for decompensated diastolic heart failure which was thought to be secondary to dietary indiscretion.  I reviewed the echocardiogram which was performed at that time which demonstrated normal LV systolic function and a small pericardial effusion.  He told me he has had worsening shortness of breath over the last 3 to 4 days.  When he presented to the ED a CT angiography of the chest was performed which demonstrated a large pericardial effusion and small bilateral pleural effusions with details noted above.  He says his breathing has already improved this morning. He denies chest pain and palpitations.  Labs reviewed above with minimal BNP elevation.  CRP and ESR both noted  to be elevated.  I personally reviewed the ECG which demonstrates rate controlled atrial fibrillation.  Recommendations: A follow-up limited echocardiogram has been ordered and will soon be performed to characterize pericardial effusion.  There are no physical exam findings of tamponade.  He has no symptoms to suggest pericarditis as a possible etiology although both CRP and ESR are certainly elevated. We will plan to diurese with IV Lasix cautiously. Beta-blocker and apixaban have been held, the latter in case a pericardiocentesis is necessary.   Kate Sable, MD, [facility]  07/09/2019 9:47 AM

## 2019-07-09 NOTE — Progress Notes (Signed)
*  PRELIMINARY RESULTS* Echocardiogram 2D Echocardiogram LIMITED with definity has been performed.  Leavy Cella 07/09/2019, 10:39 AM

## 2019-07-09 NOTE — Progress Notes (Signed)
PROGRESS NOTE  Justin Ruiz [id number] DOB: 08/13/1950 DOA: 07/08/2019 PCP: [name], MD  Brief History:  69 year old male with a history of diastolic CHF, diabetes mellitus type 2, MGUS, chronic respiratory failure on 3 L, hypertension, hyperlipidemia, non-Hodgkin's lymphoma presenting with 3-day history of shortness of breath.  He denied any fevers, chills, headache, coughing, hemoptysis, chest pain, abdominal pain, worsening lower extremity edema. Notably, the patient had a recent hospital admission from 06/25/2019 2 06/28/2019 for acute on chronic diastolic CHF with acute on chronic respiratory failure.  He also had A. fib with RVR during that hospital admission.  He was discharged home on furosemide 40 mg twice daily.  His discharge weight was 133.9 kg (295.3 lbs). In the emergency department, the patient was afebrile hemodynamically stable with oxygen saturation 100% on 3 L.  BMP showed a sodium 133, serum creatinine 1.20.  WBC 9.9, hemoglobin 11.9, platelets 267,000.  CTA chest was obtained and was negative for pulmonary embolus but showed a large pericardial effusion that was new with small bilateral pleural effusions, right greater than left.  The patient was admitted for further evaluation.  Cardiology was consulted to assist with management.  Assessment/Plan: Pericardial effusion -07/08/2019 CTA chest--negative PE, large pericardial effusion -06/26/2019 echo--EF 55 to 60%, no WMA, small pericardial effusion -Repeat limited echocardiogram -Cardiology consult -Continue furosemide home dose  Acute on Chronic diastolic CHF -[id number] echo--EF 55 to 60%, no WMA, small pericardial effusion -Daily weights -07/09/19 standing weight 296.8 lbs -06/28/19 discharge weight 295.3 lbs -start IV lasix -Continue metoprolol  Atrial fibrillation, chronic -Rate controlled -Continue metoprolol tartrate--pt was on 100 mg bid at d/c 3/5 -Holding apixaban in preparation for possible  procedure -TSH 1.256 -personally reviewed EKG--afib, nonspecific T wave changes  Chronic respiratory failure with hypoxia -Patient is chronically on 2-3 L at home  Uncontrolled diabetes mellitus type 2 with hyperglycemia -Holding  NPH due to hypoglycemia -NovoLog sliding scale -02/20/2019 hemoglobin A1c 9.9 -Repeat hemoglobin A1c -Holding metformin  Essential hypertension -Continue metoprolol  COPD -Continue Breo/Incruse -stable on 3L  Morbid obesity -BMI 43.76 -lifestyle modification  NonHodgkin's Lymphoma/MGUS -outpt follow up with Dr. Julien Nordmann        Disposition Plan: Patient From: Home D/C Place: Home- 2-3  Days Barriers: Not Clinically Stable--cardiology eval for large pericardial effusion  Family Communication:   No Family at bedside  Consultants:  cardiology  Code Status:  FULL   DVT Prophylaxis:  apixaban on hold for possible procedure   Procedures: As Listed in Progress Note Above  Antibiotics: None       Subjective: Patient denies fevers, chills, headache, chest pain, dyspnea, nausea, vomiting, diarrhea, abdominal pain, dysuria, hematuria, hematochezia, and melena.   Objective: Vitals:   07/08/19 2149 07/09/19 0121 07/09/19 0304 07/09/19 0440  BP: 109/69 118/72  105/78  Pulse: 90 93  81  Resp: (!) 22 20 14 20   Temp: 98.1 F (36.7 C) 98.1 F (36.7 C)  98.4 F (36.9 C)  TempSrc:    Oral  SpO2: 100% 94% 97% 100%  Weight:    134.4 kg  Height:        Intake/Output Summary (Last 24 hours) at 07/09/2019 0828 Last data filed at 07/09/2019 0727 Gross per 24 hour  Intake 480 ml  Output 400 ml  Net 80 ml   Weight change:  Exam:   General:  Pt is alert, follows commands appropriately, not in acute distress  HEENT: No icterus, No thrush,  No neck mass, [place]/AT  Cardiovascular: IRRR, S1/S2, no rubs, no gallops  Respiratory: bibasilar crackles, no wheeze  Abdomen: Soft/+BS, non tender, non distended, no  guarding  Extremities: No edema, No lymphangitis, No petechiae, No rashes, no synovitis   Data Reviewed: I have personally reviewed following labs and imaging studies Basic Metabolic Panel: Recent Labs  Lab 07/04/19 1408 07/08/19 1628 07/09/19 0506  NA 140 137 133*  K 4.2 4.0 4.2  CL 95* 91* 88*  CO2 32* 36* 36*  GLUCOSE 45* 98 248*  BUN 23 28* 28*  CREATININE 1.02 1.20 1.13  CALCIUM 9.2 8.9 8.7*   Liver Function Tests: Recent Labs  Lab 07/04/19 1408 07/08/19 1628 07/09/19 0506  AST 14 15 14*  ALT 10 11 12   ALKPHOS 99 96 95  BILITOT 0.7 1.8* 1.5*  PROT 6.6 7.4 7.0  ALBUMIN 3.6* 3.1* 2.9*   No results for input(s): LIPASE, AMYLASE in the last 168 hours. No results for input(s): AMMONIA in the last 168 hours. Coagulation Profile: No results for input(s): INR, PROTIME in the last 168 hours. CBC: Recent Labs  Lab 07/04/19 1408 07/08/19 1628 07/09/19 0506  WBC 7.4 9.9 10.4  NEUTROABS 5.4 7.8* 8.2*  HGB 12.1* 11.9* 11.4*  HCT 36.6* 38.5* 36.7*  MCV 92 95.1 95.8  PLT 301 287 267   Cardiac Enzymes: No results for input(s): CKTOTAL, CKMB, CKMBINDEX, TROPONINI in the last 168 hours. BNP: Invalid input(s): POCBNP CBG: Recent Labs  Lab 07/08/19 2314 07/08/19 2316 07/09/19 0128 07/09/19 0437 07/09/19 0750  GLUCAP 151* 138* 198* 246* 191*   HbA1C: No results for input(s): HGBA1C in the last 72 hours. Urine analysis:    Component Value Date/Time   COLORURINE YELLOW 06/25/2019 1133   APPEARANCEUR CLEAR 06/25/2019 1133   APPEARANCEUR Clear 05/30/2018 1147   LABSPEC 1.026 06/25/2019 1133   PHURINE 5.0 06/25/2019 1133   GLUCOSEU >=500 (A) 06/25/2019 1133   HGBUR SMALL (A) 06/25/2019 1133   BILIRUBINUR NEGATIVE 06/25/2019 1133   BILIRUBINUR Negative 05/30/2018 1147   KETONESUR 20 (A) 06/25/2019 1133   PROTEINUR NEGATIVE 06/25/2019 1133   UROBILINOGEN negative 05/01/2013 1115   UROBILINOGEN 0.2 05/18/2010 0352   NITRITE NEGATIVE 06/25/2019 1133    LEUKOCYTESUR NEGATIVE 06/25/2019 1133   Sepsis Labs: @LABRCNTIP (procalcitonin:4,lacticidven:4) )No results found for this or any previous visit (from the past 240 hour(s)).   Scheduled Meds: . fluticasone furoate-vilanterol  1 puff Inhalation Daily   And  . umeclidinium bromide  1 puff Inhalation Daily  . furosemide  40 mg Oral q1800  . furosemide  80 mg Oral Daily  . insulin aspart  0-15 Units Subcutaneous TID WC  . insulin aspart  0-5 Units Subcutaneous QHS  . PARoxetine  20 mg Oral Daily  . simvastatin  40 mg Oral q1800  . sodium chloride flush  3 mL Intravenous Q12H  . sodium chloride flush  3 mL Intravenous Q12H   Continuous Infusions: . sodium chloride      Procedures/Studies: DG Chest 2 View  Result Date: 07/04/2019 CLINICAL DATA:  Dyspnea. EXAM: CHEST - 2 VIEW COMPARISON:  June 25, 2018 FINDINGS: There is stable upper limits of normal heart size with prominent pericardial fat pad extending into the bilateral costophrenic angles. Inspiration is shallow. There is chronic centrilobular emphysema. Mild bilateral infrahilar bronchitis projects on the cardiophrenic angles. There is no pulmonary edema, obvious pleural effusion, pneumothorax or pneumonia. Moderate skeletal degenerative changes and aortic knob calcified atherosclerosis are present. IMPRESSION: Mild centrilobular emphysema with  infrahilar bronchitis. Prominent pericardial fat pad, which degrades evaluation for pleural fluid. No pneumonia or pulmonary edema. Electronically Signed   By: Revonda Humphrey   On: 07/04/2019 23:18   CT Angio Chest PE W/Cm &/Or Wo Cm  Result Date: 07/08/2019 CLINICAL DATA:  Shortness of breath EXAM: CT ANGIOGRAPHY CHEST WITH CONTRAST TECHNIQUE: Multidetector CT imaging of the chest was performed using the standard protocol during bolus administration of intravenous contrast. Multiplanar CT image reconstructions and MIPs were obtained to evaluate the vascular anatomy. CONTRAST:  148mL OMNIPAQUE  IOHEXOL 350 MG/ML SOLN COMPARISON:  CTA chest 11/08/2018 FINDINGS: Cardiovascular: Contrast injection is sufficient to demonstrate satisfactory opacification of the pulmonary arteries to the segmental level. There is no pulmonary embolus or evidence of right heart strain. The size of the main pulmonary artery is normal. There is a large pericardial effusion that measures 2.7 cm thickness, new since the prior scan of 11/08/2018. The course and caliber of the aorta are normal. There is mild atherosclerotic calcification. Opacification decreased due to pulmonary arterial phase contrast bolus timing. Mediastinum/Nodes: Multiple subcentimeter mediastinal lymph nodes. Lungs/Pleura: There are small pleural effusions, slightly larger on the right. Bibasilar atelectasis. Airways are patent. Upper Abdomen: Contrast bolus timing is not optimized for evaluation of the abdominal organs. There is cholelithiasis. No acute findings. Musculoskeletal: No chest wall abnormality. No bony spinal canal stenosis. Review of the MIP images confirms the above findings. IMPRESSION: 1. No pulmonary embolus. 2. Large pericardial effusion, new since the prior scan. 3. Small pleural effusions with associated bibasilar atelectasis. Electronically Signed   By: Ulyses Jarred M.D.   On: 07/08/2019 19:18   DG Chest Port 1 View  Result Date: 07/08/2019 CLINICAL DATA:  Shortness of breath. EXAM: PORTABLE CHEST 1 VIEW COMPARISON:  July 04, 2019. FINDINGS: Stable cardiomegaly. Left lung base is not entirely included in field-of-view. Bibasilar opacities are noted concerning for atelectasis or infiltrates with possible effusions. No pneumothorax is noted. Bony thorax is unremarkable. IMPRESSION: Bibasilar opacities are noted concerning for atelectasis or infiltrates with possible effusions. Exam is limited as left lung base is not entirely included in field-of-view. Electronically Signed   By: Marijo Conception M.D.   On: 07/08/2019 16:37   DG Chest  Port 1 View  Result Date: 06/25/2019 CLINICAL DATA:  Shortness of breath EXAM: PORTABLE CHEST 1 VIEW COMPARISON:  11/01/2018 FINDINGS: The heart size and mediastinal contours are stable. Calcific aortic knob. Mild pulmonary vascular congestion. Minimal linear scarring versus atelectasis in the left lung base. No focal airspace consolidation, pleural effusion, or pneumothorax. The visualized skeletal structures are unremarkable. IMPRESSION: Mild pulmonary vascular congestion. Electronically Signed   By: Davina Poke D.O.   On: 06/25/2019 11:53   ECHOCARDIOGRAM COMPLETE  Result Date: 06/26/2019    ECHOCARDIOGRAM REPORT   Patient Name:   Justin Ruiz Date of Exam: 06/26/2019 Medical Rec #:  [id number]      Height:       69.0 in Accession #:    [id number]     Weight:       295.4 lb Date of Birth:  1951/03/25      BSA:          2.439 m Patient Age:    53 years       BP:           142/82 mmHg Patient Gender: M              HR:  106 bpm. Exam Location:  [facility] Procedure: 2D Echo Indications:    CHF-Acute Diastolic [id number] / [id number]  History:        Patient has prior history of Echocardiogram examinations, most                 recent 03/29/2016. CHF, COPD, Arrythmias:Atrial Fibrillation;                 Risk Factors:Diabetes, Dyslipidemia, Hypertension and Former                 Smoker.  Sonographer:    Leavy Cella RDCS (AE) Referring Phys: [phone] Amairani Shuey IMPRESSIONS  1. Left ventricular ejection fraction, by estimation, is 55 to 60%. The left ventricle has normal function. The left ventricle has no regional wall motion abnormalities. There is mild left ventricular hypertrophy. Left ventricular diastolic parameters are indeterminate.  2. Right ventricular systolic function is mildly reduced. The right ventricular size is mildly enlarged. There is mildly elevated pulmonary artery systolic pressure.  3. Left atrial size was severely dilated.  4. The mitral valve is normal in structure and function. No  evidence of mitral valve regurgitation. No evidence of mitral stenosis.  5. The aortic valve has an indeterminant number of cusps. Aortic valve regurgitation is not visualized. No aortic stenosis is present.  6. The inferior vena cava is normal in size with greater than 50% respiratory variability, suggesting right atrial pressure of 3 mmHg. FINDINGS  Left Ventricle: Left ventricular ejection fraction, by estimation, is 55 to 60%. The left ventricle has normal function. The left ventricle has no regional wall motion abnormalities. The left ventricular internal cavity size was small. There is mild left ventricular hypertrophy. Left ventricular diastolic parameters are indeterminate. Right Ventricle: The right ventricular size is mildly enlarged. Right vetricular wall thickness was not assessed. Right ventricular systolic function is mildly reduced. There is mildly elevated pulmonary artery systolic pressure. The tricuspid regurgitant velocity is 2.68 m/s, and with an assumed right atrial pressure of 10 mmHg, the estimated right ventricular systolic pressure is 123XX123 mmHg. Left Atrium: Left atrial size was severely dilated. Right Atrium: Right atrial size was not well visualized. Pericardium: A small pericardial effusion is present. The pericardial effusion is circumferential. Mitral Valve: The mitral valve is normal in structure and function. No evidence of mitral valve regurgitation. No evidence of mitral valve stenosis. Tricuspid Valve: The tricuspid valve is normal in structure. Tricuspid valve regurgitation is trivial. No evidence of tricuspid stenosis. Aortic Valve: The aortic valve has an indeterminant number of cusps. . There is mild thickening and mild calcification of the aortic valve. Aortic valve regurgitation is not visualized. No aortic stenosis is present. Mild aortic valve annular calcification. There is mild thickening of the aortic valve. There is mild calcification of the aortic valve. Aortic valve  mean gradient measures 3.4 mmHg. Aortic valve peak gradient measures 6.7 mmHg. Aortic valve area, by VTI measures 1.41 cm. Pulmonic Valve: The pulmonic valve was not well visualized. Pulmonic valve regurgitation is not visualized. No evidence of pulmonic stenosis. Aorta: The aortic root is normal in size and structure. Pulmonary Artery: Indeterminant PASP, inadequate TR jet. Venous: The inferior vena cava is normal in size with greater than 50% respiratory variability, suggesting right atrial pressure of 3 mmHg. IAS/Shunts: No atrial level shunt detected by color flow Doppler.  LEFT VENTRICLE PLAX 2D LVOT diam:     2.10 cm  Diastology LV SV:  36       LV e' lateral:   8.18 cm/s LV SV Index:   15       LV E/e' lateral: 12.2 LVOT Area:     3.46 cm LV e' medial:    8.48 cm/s                         LV E/e' medial:  11.8  RIGHT VENTRICLE RV S prime:     8.92 cm/s TAPSE (M-mode): 1.4 cm LEFT ATRIUM              Index       RIGHT ATRIUM           Index LA Vol (A2C):   93.6 ml  38.38 ml/m RA Area:     18.40 cm LA Vol (A4C):   126.0 ml 51.67 ml/m RA Volume:   49.80 ml  20.42 ml/m LA Biplane Vol: 109.0 ml 44.70 ml/m  AORTIC VALVE AV Area (Vmax):    1.70 cm AV Area (Vmean):   1.60 cm AV Area (VTI):     1.41 cm AV Vmax:           129.71 cm/s AV Vmean:          85.586 cm/s AV VTI:            0.256 m AV Peak Grad:      6.7 mmHg AV Mean Grad:      3.4 mmHg LVOT Vmax:         63.54 cm/s LVOT Vmean:        39.503 cm/s LVOT VTI:          0.104 m LVOT/AV VTI ratio: 0.41 MITRAL VALVE               TRICUSPID VALVE MV Area (PHT): 4.49 cm    TR Peak grad:   28.7 mmHg MV Decel Time: 169 msec    TR Vmax:        268.00 cm/s MV E velocity: 99.90 cm/s MV A velocity: 21.30 cm/s  SHUNTS MV E/A ratio:  4.69        Systemic VTI:  0.10 m                            Systemic Diam: 2.10 cm Carlyle Dolly MD Electronically signed by Carlyle Dolly MD Signature Date/Time: 06/26/2019/12:47:29 PM    Final     Orson Eva, DO  [facility]  If 7PM-7AM, please contact night-coverage [email or web address] Password [email or web address] 07/09/2019, 8:28 AM   LOS: 1 day

## 2019-07-09 NOTE — Progress Notes (Signed)
Hypoglycemic Event  CBG: 36  Treatment: 8 oz juice/soda  Symptoms: Pale   Follow-up CBG: Time:2227 CBG Result:32  Possible Reasons for Event: Inadequate meal intake  Comments/MD notified: Dr. Myna Hidalgo notified. New orders for D50 29mL. Patient given D50 and crackers. At 2316, CBG 138.     Justin Ruiz

## 2019-07-09 NOTE — Progress Notes (Signed)
Patient is refusing the use of CPAP for tonight stating he would rather just wear his cannula. RT informed patient if he has any trouble have RN contact RT. RN aware.

## 2019-07-09 NOTE — Progress Notes (Signed)
Patient c/o shortness of breath. Neb treatment given by Respiratory Therapy without help. Patient receiving Lasix 40 mg IV BID and received both doses. Patient sitting on side of bed, O2 sat 96-100% on 3L [place]. Xanax given PO. Mid-level paged and no new orders at this time. Will continue to monitor patient.

## 2019-07-10 ENCOUNTER — Other Ambulatory Visit: Payer: Self-pay | Admitting: Student

## 2019-07-10 ENCOUNTER — Inpatient Hospital Stay: Payer: Medicare Other

## 2019-07-10 ENCOUNTER — Ambulatory Visit: Payer: Medicare Other | Admitting: Family Medicine

## 2019-07-10 ENCOUNTER — Inpatient Hospital Stay: Payer: Medicare Other | Admitting: Internal Medicine

## 2019-07-10 ENCOUNTER — Telehealth: Payer: Self-pay | Admitting: Family Medicine

## 2019-07-10 DIAGNOSIS — I3139 Other pericardial effusion (noninflammatory): Secondary | ICD-10-CM

## 2019-07-10 DIAGNOSIS — I313 Pericardial effusion (noninflammatory): Secondary | ICD-10-CM

## 2019-07-10 LAB — BASIC METABOLIC PANEL
Anion gap: 9 (ref 5–15)
BUN: 29 mg/dL — ABNORMAL HIGH (ref 8–23)
CO2: 36 mmol/L — ABNORMAL HIGH (ref 22–32)
Calcium: 8.9 mg/dL (ref 8.9–10.3)
Chloride: 88 mmol/L — ABNORMAL LOW (ref 98–111)
Creatinine, Ser: 1.18 mg/dL (ref 0.61–1.24)
GFR calc Af Amer: >60 mL/min (ref >60)
GFR calc non Af Amer: >60 mL/min (ref >60)
Glucose, Bld: 191 mg/dL — ABNORMAL HIGH (ref 70–99)
Potassium: 4.9 mmol/L (ref 3.5–5.1)
Sodium: 133 mmol/L — ABNORMAL LOW (ref 135–145)

## 2019-07-10 LAB — GLUCOSE, CAPILLARY
Glucose-Capillary: 179 mg/dL — ABNORMAL HIGH (ref 70–99)
Glucose-Capillary: 178 mg/dL — ABNORMAL HIGH (ref 70–99)
Glucose-Capillary: 347 mg/dL — ABNORMAL HIGH (ref 70–99)

## 2019-07-10 LAB — MAGNESIUM: Magnesium: 1.8 mg/dL (ref 1.7–2.4)

## 2019-07-10 MED ORDER — ALPRAZOLAM 0.25 MG PO TABS
0.2500 mg | ORAL_TABLET | ORAL | Status: DC | PRN
  Administered 2019-07-10 (×2): 0.25 mg via ORAL
  Filled 2019-07-10 (×2): qty 1

## 2019-07-10 MED ORDER — INSULIN NPH (HUMAN) (ISOPHANE) 100 UNIT/ML ~~LOC~~ SUSP: 50.0000 [IU] | Freq: Two times a day (BID) | SUBCUTANEOUS | Status: DC

## 2019-07-10 MED ORDER — FUROSEMIDE 40 MG PO TABS: 40.0000 mg | ORAL_TABLET | Freq: Two times a day (BID) | ORAL | Status: DC

## 2019-07-10 NOTE — TOC Progression Note (Signed)
Per MD, pt being discharged home today. Pt was active with [facility] PT/OT prior to admission. MD has entered [facility] resumption orders. Notified Tim at [facility]. There are no other TOC needs identified for dc.

## 2019-07-10 NOTE — Telephone Encounter (Signed)
Hospital follow up made for SOB televisit with stacks

## 2019-07-10 NOTE — Progress Notes (Signed)
Progress Note  Patient Name: Justin Ruiz Date of Encounter: 07/10/2019  Primary Cardiologist: Minus Breeding, MD   Subjective   Says he is a little more short of breath this morning, but wife reportedly said he has panic attacks and sometimes it is even difficult for her to tell whether or not he is having breathing issues vs issues with anxiety/panic.  Inpatient Medications    Scheduled Meds: . apixaban  5 mg Oral BID  . fluticasone furoate-vilanterol  1 puff Inhalation Daily   And  . umeclidinium bromide  1 puff Inhalation Daily  . furosemide  40 mg Intravenous BID  . insulin aspart  0-15 Units Subcutaneous TID WC  . insulin aspart  0-5 Units Subcutaneous QHS  . metoprolol tartrate  100 mg Oral BID  . PARoxetine  20 mg Oral Daily  . simvastatin  40 mg Oral q1800  . sodium chloride flush  3 mL Intravenous Q12H  . sodium chloride flush  3 mL Intravenous Q12H   Continuous Infusions: . sodium chloride     PRN Meds: sodium chloride, acetaminophen **OR** acetaminophen, ALPRAZolam, HYDROcodone-acetaminophen, ipratropium-albuterol, senna-docusate, sodium chloride flush   Vital Signs    Vitals:   07/10/19 0524 07/10/19 0723 07/10/19 0728 07/10/19 0732  BP: 118/76     Pulse: 90     Resp: 20     Temp: 98.3 F (36.8 C)     TempSrc: Oral     SpO2: 97% 97% 97% 97%  Weight: 135.6 kg     Height:        Intake/Output Summary (Last 24 hours) at 07/10/2019 1001 Last data filed at 07/10/2019 0302 Gross per 24 hour  Intake 480 ml  Output 400 ml  Net 80 ml   Filed Weights   07/08/19 2149 07/09/19 0440 07/10/19 0524  Weight: 131.9 kg 134.4 kg 135.6 kg    Telemetry    Rate controlled atrial fibrillation - Personally Reviewed  Physical Exam   GEN: Morbidly obese male in no acute distress.   Neck: No JVD Cardiac: Irregular rhythm, normal rate, no murmurs, rubs, or gallops.  Respiratory: Clear to auscultation bilaterally. GI: Soft, nontender, non-distended  MS: Trace  bilateral LE edema; No deformity. Neuro:  Nonfocal  Psych: Normal affect   Labs    Chemistry Recent Labs  Lab 07/04/19 1408 07/04/19 1408 07/08/19 1628 07/09/19 0506 07/10/19 0527  NA 140  --  137 133* 133*  K 4.2   < > 4.0 4.2 4.9  CL 95*   < > 91* 88* 88*  CO2 32*   < > 36* 36* 36*  GLUCOSE 45*  --  98 248* 191*  BUN 23  --  28* 28* 29*  CREATININE 1.02   < > 1.20 1.13 1.18  CALCIUM 9.2   < > 8.9 8.7* 8.9  PROT 6.6  --  7.4 7.0  --   ALBUMIN 3.6*  --  3.1* 2.9*  --   AST 14  --  15 14*  --   ALT 10  --  11 12  --   ALKPHOS 99  --  96 95  --   BILITOT 0.7  --  1.8* 1.5*  --   GFRNONAA 75   < > >60 >60 >60  GFRAA 87   < > >60 >60 >60  ANIONGAP  --   --  10 9 9    < > = values in this interval not displayed.     Hematology  Recent Labs  Lab 07/04/19 1408 07/08/19 1628 07/09/19 0506  WBC 7.4 9.9 10.4  RBC 3.99* 4.05* 3.83*  HGB 12.1* 11.9* 11.4*  HCT 36.6* 38.5* 36.7*  MCV 92 95.1 95.8  MCH 30.3 29.4 29.8  MCHC 33.1 30.9 31.1  RDW 12.9 13.1 13.1  PLT 301 287 267    Cardiac EnzymesNo results for input(s): TROPONINI in the last 168 hours. No results for input(s): TROPIPOC in the last 168 hours.   BNP Recent Labs  Lab 07/04/19 1408 07/08/19 1628  BNP 159.0* 151.0*     DDimer No results for input(s): DDIMER in the last 168 hours.   Radiology    CT Angio Chest PE W/Cm &/Or Wo Cm  Result Date: 07/08/2019 CLINICAL DATA:  Shortness of breath EXAM: CT ANGIOGRAPHY CHEST WITH CONTRAST TECHNIQUE: Multidetector CT imaging of the chest was performed using the standard protocol during bolus administration of intravenous contrast. Multiplanar CT image reconstructions and MIPs were obtained to evaluate the vascular anatomy. CONTRAST:  162mL OMNIPAQUE IOHEXOL 350 MG/ML SOLN COMPARISON:  CTA chest 11/08/2018 FINDINGS: Cardiovascular: Contrast injection is sufficient to demonstrate satisfactory opacification of the pulmonary arteries to the segmental level. There is no  pulmonary embolus or evidence of right heart strain. The size of the main pulmonary artery is normal. There is a large pericardial effusion that measures 2.7 cm thickness, new since the prior scan of 11/08/2018. The course and caliber of the aorta are normal. There is mild atherosclerotic calcification. Opacification decreased due to pulmonary arterial phase contrast bolus timing. Mediastinum/Nodes: Multiple subcentimeter mediastinal lymph nodes. Lungs/Pleura: There are small pleural effusions, slightly larger on the right. Bibasilar atelectasis. Airways are patent. Upper Abdomen: Contrast bolus timing is not optimized for evaluation of the abdominal organs. There is cholelithiasis. No acute findings. Musculoskeletal: No chest wall abnormality. No bony spinal canal stenosis. Review of the MIP images confirms the above findings. IMPRESSION: 1. No pulmonary embolus. 2. Large pericardial effusion, new since the prior scan. 3. Small pleural effusions with associated bibasilar atelectasis. Electronically Signed   By: Ulyses Jarred M.D.   On: 07/08/2019 19:18   DG Chest Port 1 View  Result Date: 07/08/2019 CLINICAL DATA:  Shortness of breath. EXAM: PORTABLE CHEST 1 VIEW COMPARISON:  July 04, 2019. FINDINGS: Stable cardiomegaly. Left lung base is not entirely included in field-of-view. Bibasilar opacities are noted concerning for atelectasis or infiltrates with possible effusions. No pneumothorax is noted. Bony thorax is unremarkable. IMPRESSION: Bibasilar opacities are noted concerning for atelectasis or infiltrates with possible effusions. Exam is limited as left lung base is not entirely included in field-of-view. Electronically Signed   By: Marijo Conception M.D.   On: 07/08/2019 16:37   ECHOCARDIOGRAM LIMITED  Result Date: 07/09/2019    ECHOCARDIOGRAM LIMITED REPORT   Patient Name:   Justin Ruiz Date of Exam: 07/09/2019 Medical Rec #:  [id number]      Height:       69.0 in Accession #:    [id number]      Weight:       296.3 lb Date of Birth:  May 08, 1950      BSA:          2.442 m Patient Age:    69 years       BP:           105/78 mmHg Patient Gender: M              HR:  81 bpm. Exam Location:  [facility] Procedure: Limited Echo and Saline Contrast Bubble Study Indications:    Pericardial effusion 423.9 / I31.3  History:        Patient has prior history of Echocardiogram examinations, most                 recent 06/26/2019. CHF, COPD, Arrythmias:Atrial Fibrillation; Risk                 Factors:Diabetes, Hypertension and Dyslipidemia. Pericardial                 Effusion, NHL (non-Hodgkin's lymphoma.  Sonographer:    Leavy Cella RDCS (AE) Referring Phys: [id number] [place]  1. Left ventricular ejection fraction, by estimation, is 55 to 60%. The left ventricle has normal function. The left ventricle has no regional wall motion abnormalities. There is mild concentric left ventricular hypertrophy. Left ventricular diastolic parameters are indeterminate.  2. Right ventricular systolic function is mildly reduced. The right ventricular size is mildly enlarged.  3. Left atrial size was mildly dilated.  4. Right atrial size was mildly dilated.  5. Moderate pericardial effusion. The pericardial effusion is circumferential. There is no evidence of cardiac tamponade.  6. The mitral valve is degenerative. No evidence of mitral valve regurgitation.  7. The aortic valve was not well visualized. No aortic stenosis is present.  8. The inferior vena cava is normal in size with greater than 50% respiratory variability, suggesting right atrial pressure of 3 mmHg. FINDINGS  Left Ventricle: Left ventricular ejection fraction, by estimation, is 55 to 60%. The left ventricle has normal function. The left ventricle has no regional wall motion abnormalities. The left ventricular internal cavity size was normal in size. There is  mild concentric left ventricular hypertrophy. Right Ventricle: The right ventricular  size is mildly enlarged. Right ventricular systolic function is mildly reduced. Left Atrium: Left atrial size was mildly dilated. Right Atrium: Right atrial size was mildly dilated. Pericardium: A moderately sized pericardial effusion is present. The pericardial effusion is circumferential. There is no evidence of cardiac tamponade. Mitral Valve: The mitral valve is degenerative in appearance. Mild mitral annular calcification. Aortic Valve: The aortic valve was not well visualized. . There is mild thickening and mild calcification of the aortic valve. No aortic stenosis is present. Mild aortic valve annular calcification. There is mild thickening of the aortic valve. There is mild calcification of the aortic valve. Pulmonic Valve: The pulmonic valve was not well visualized. Pulmonic valve regurgitation is not visualized. Aorta: The aortic root is normal in size and structure. Venous: The inferior vena cava is normal in size with greater than 50% respiratory variability, suggesting right atrial pressure of 3 mmHg. IAS/Shunts: No atrial level shunt detected by color flow Doppler. Agitated saline contrast was given intravenously to evaluate for intracardiac shunting.  LEFT VENTRICLE PLAX 2D LVIDd:         4.87 cm LVIDs:         3.77 cm LV PW:         1.02 cm LV IVS:        1.23 cm LVOT diam:     2.00 cm LVOT Area:     3.14 cm  LEFT ATRIUM         Index LA diam:    4.70 cm 1.92 cm/m   AORTA Ao Root diam: 3.40 cm MITRAL VALVE MV Area (PHT): 4.31 cm     SHUNTS MV Decel Time: 176 msec  Systemic Diam: 2.00 cm MV E velocity: 109.00 cm/s MV A velocity: 37.30 cm/s MV E/A ratio:  2.92 Kate Sable MD Electronically signed by Kate Sable MD Signature Date/Time: 07/09/2019/10:57:31 AM    Final     Cardiac Studies   Echo 07/09/19:  1. Left ventricular ejection fraction, by estimation, is 55 to 60%. The  left ventricle has normal function. The left ventricle has no regional  wall motion abnormalities.  There is mild concentric left ventricular  hypertrophy. Left ventricular diastolic  parameters are indeterminate.  2. Right ventricular systolic function is mildly reduced. The right  ventricular size is mildly enlarged.  3. Left atrial size was mildly dilated.  4. Right atrial size was mildly dilated.  5. Moderate pericardial effusion. The pericardial effusion is  circumferential. There is no evidence of cardiac tamponade.  6. The mitral valve is degenerative. No evidence of mitral valve  regurgitation.  7. The aortic valve was not well visualized. No aortic stenosis is  present.  8. The inferior vena cava is normal in size with greater than 50%  respiratory variability, suggesting right atrial pressure of 3 mmHg.   Patient Profile     69 y.o. male  with past medical history of permanent atrial fibrillation, chronic diastolic CHF, HTN, HLD, IDDM, obesity, OSA and COPD with chronic hypoxic respiratory failure (on 3-4L [place] at baseline) who is being seen today for the evaluation of a pericardial effusion at the request of Dr. Carles Collet.   Assessment & Plan    1. Pericardial effusion: Moderate in size by echo on 3/16 without tamponade physiology. Would recommend a limited follow up echocardiogram in 2-3 weeks.  2. Chronic diastolic heart failure:  BNP 151 this admission (elevated to 1520 last admission) and CTA showed small pleural effusions. Currently on IV Lasix 40 mg bid. Weight overall stable. He can transition to oral diuretics.  3. Permanent atrial fibrillation: HR controlled on Lopressor 100 mg bid. Anticoagulated with apixaban 5 mg bid.     [facility] will sign off.   Medication Recommendations:  None Other recommendations (labs, testing, etc):  As above Follow up as an outpatient:  Will arrange  For questions or updates, please contact [place] Please consult [email or web address] for contact info under Cardiology/STEMI.      Signed, Kate Sable, MD  07/10/2019,  10:01 AM

## 2019-07-10 NOTE — Discharge Summary (Signed)
Physician Discharge Summary  EARON RIVEST ZOX:[id number] DOB: August 19, 1950 DOA: 07/08/2019  PCP: [name], MD Cardiologist: Percival Spanish Oncologist: Mohamed  Admit date: 07/08/2019 Discharge date: 07/10/2019  Admitted From: Home  Disposition:  Home   Recommendations for Outpatient Follow-up:  1. Follow up with PCP in 1 weeks 2. Follow up with cardiology as arranged for Echo and office visit follow up  Home Health: RN, PT, OT, SW  Discharge Condition: STABLE   CODE STATUS: FULL    Brief Hospitalization Summary: Please see all hospital notes, images, labs for full details of the hospitalization. Brief History:  69 year old male with a history of diastolic CHF, diabetes mellitus type 2, MGUS, chronic respiratory failure on 3 L, hypertension, hyperlipidemia, non-Hodgkin's lymphoma presenting with 3-day history of shortness of breath.  He denied any fevers, chills, headache, coughing, hemoptysis, chest pain, abdominal pain, worsening lower extremity edema. Notably, the patient had a recent hospital admission from 06/25/2019 2 06/28/2019 for acute on chronic diastolic CHF with acute on chronic respiratory failure.  He also had A. fib with RVR during that hospital admission.  He was discharged home on furosemide 40 mg twice daily.  His discharge weight was 133.9 kg (295.3 lbs). In the emergency department, the patient was afebrile hemodynamically stable with oxygen saturation 100% on 3 L.  BMP showed a sodium 133, serum creatinine 1.20.  WBC 9.9, hemoglobin 11.9, platelets 267,000.  CTA chest was obtained and was negative for pulmonary embolus but showed a large pericardial effusion that was new with small bilateral pleural effusions, right greater than left.  The patient was admitted for further evaluation.  Cardiology was consulted to assist with management.  Assessment/Plan: Pericardial effusion - Moderate per Echocardiogram  -Echo 3/16 without tamponade physiology. Cardiology recommends limited  follow up echo in 2-3 weeks which they are arranging.    -07/08/2019 CTA chest--negative PE, large pericardial effusion -06/26/2019 echo--EF 55 to 60%, no WMA, small pericardial effusion -Repeat limited echocardiogram -Cardiology consulted and followed, see above recs -Continue furosemide home dose  MILD Acute on Chronic diastolic CHF - RESOLVED -11/23/1912 echo--EF 55 to 60%, no WMA, small pericardial effusion -Daily weights -07/09/19 standing weight 296.8 lbs -06/28/19 discharge weight 295.3 lbs -started IV lasix but now resuming home oral lasix.  -Continue home metoprolol  Uncontrolled GAD  -  A lot of his dyspnea symptoms related to anxiety relieved by taking his alprazolam.  He says he takes at home 3-4 times per day to relieve symptoms.  He is on prozac 20 mg daily.  I spoke with patient and wife and told them that his anxiety appears uncontrolled and likely needs further management and possibly referral to psychiatry as he has become dependent on alprazolam and possibly could benefit from increasing prozac or adding buspar or some other treatment to get him weaned off alprazolam.   Wife says she will discuss with PCP.    Atrial fibrillation, chronic -Rate controlled -Continue metoprolol tartrate--pt was on 100 mg bid at d/c 3/5 -Holding apixaban in preparation for possible procedure -TSH 1.256 -personally reviewed EKG--afib, nonspecific T wave changes  Chronic respiratory failure with hypoxia -Patient is chronically on 3 L at home which has not been changed  Uncontrolled diabetes mellitus type 2 with hyperglycemia -Holding  NPH due to hypoglycemia -NovoLog sliding scale -02/20/2019 hemoglobin A1c 9.9 -Repeat hemoglobin A1c is 9.1% which is poorly controlled disease, follow up with PCP and consider endocrine referral -Holding metformin while in hospital  Essential hypertension -Continue metoprolol  COPD -Continue  Breo/Incruse -stable on 3L  Morbid obesity -BMI  43.76 -lifestyle modification  NonHodgkin's Lymphoma/MGUS -outpt follow up with Dr. Julien Nordmann  Disposition Plan: Patient From: Home D/C Place: Home- 2-3  Days Barriers: Not Clinically Stable--cardiology eval for large pericardial effusion  Family Communication:  wife updated by telephone  Consultants:  cardiology  Code Status:  FULL   DVT Prophylaxis:  apixaban on hold for possible procedure  Discharge Diagnoses:  Principal Problem:   Acute pericardial effusion Active Problems:   Diabetes mellitus type 2, insulin dependent ([place])   Obesity, morbid ([place])   Obstructive sleep apnea   COPD mixed type ([facility])   Chronic respiratory failure with hypoxia ([place])   Acute on chronic diastolic CHF (congestive heart failure) ([place])   Atrial fibrillation, chronic ([place])   Uncontrolled type 2 diabetes mellitus with hyperglycemia ([place])   Discharge Instructions:  Allergies as of 07/10/2019      Reactions   Avapro [irbesartan] Other (See Comments)   "doesnt sit right with me"--light headed   Lipitor [atorvastatin Calcium] Other (See Comments)   Leg pain      Medication List    TAKE these medications   Accu-Chek Aviva Plus test strip Generic drug: glucose blood Test BS QID Dx E11.9   Accu-Chek Aviva Plus w/Device Kit Test BS QID DX E11.9   Accu-Chek FastClix Lancets Misc Test BS QID Dx E11.9   acetaminophen 325 MG tablet Commonly known as: TYLENOL Take 650 mg by mouth every 6 (six) hours as needed.   albuterol 108 (90 Base) MCG/ACT inhaler Commonly known as: VENTOLIN HFA Inhale 2 puffs into the lungs every 6 (six) hours as needed for wheezing or shortness of breath.   ALPRAZolam 0.25 MG tablet Commonly known as: XANAX Take 1 tablet (0.25 mg total) by mouth 3 (three) times daily as needed. for anxiety   B-D SINGLE USE SWABS REGULAR Pads Test BS QID Dx E11.9   cephALEXin 500 MG capsule Commonly known as: KEFLEX Take 500 mg by mouth 4 (four) times daily.   Eliquis  5 MG Tabs tablet Generic drug: apixaban Take 1 tablet by mouth twice daily   furosemide 40 MG tablet Commonly known as: LASIX Take 1-2 tablets (40-80 mg total) by mouth 2 (two) times daily. Patient takes 74m in the morning and 412min the evening   insulin NPH Human 100 UNIT/ML injection Commonly known as: NovoLIN N ReliOn Inject 0.5-0.55 mLs (50-55 Units total) into the skin 2 (two) times daily before a meal.   insulin regular 100 units/mL injection Commonly known as: NOVOLIN R Inject 15 units before breakfast and supper What changed:   how much to take  how to take this  when to take this   ipratropium-albuterol 0.5-2.5 (3) MG/3ML Soln Commonly known as: DUONEB Take 3 mLs by nebulization every 6 (six) hours as needed.   Lancing Device Misc USE AS DIRECTED   metoprolol tartrate 100 MG tablet Commonly known as: LOPRESSOR Take 1 tablet by mouth twice daily   nystatin powder Commonly known as: MYCOSTATIN/NYSTOP Apply topically 2 (two) times daily. Apply to groin area and scrotum skin; keep area clean and dry.   oxymetazoline 0.05 % nasal spray Commonly known as: AFRIN Place 1 spray into both nostrils 2 (two) times daily as needed for congestion.   Ozempic (1 MG/DOSE) 2 MG/1.5ML Sopn Generic drug: Semaglutide (1 MG/DOSE) Inject 1 mg into the skin once a week.   PARoxetine 20 MG tablet Commonly known as: PAXIL Take 1 tablet by  mouth once daily   simvastatin 40 MG tablet Commonly known as: ZOCOR Take 1 tablet by mouth once daily   Trelegy Ellipta 100-62.5-25 MCG/INH Aepb Generic drug: Fluticasone-Umeclidin-Vilant Take 1 puff by mouth daily.   Vitamin D3 125 MCG (5000 UT) Tabs Take 1 tablet by mouth every morning.      Follow-up Information    [facility] Follow up.   Specialty: Home Health Services Why:  PT/OT Contact information: [address] [place] [address] [phone]        Minus Breeding, MD Follow up on 08/14/2019.    Specialty: Cardiology Why: Repeat echocardiogram will be at [facility] on 07/31/2019. Arrive at 10:15 AM for 10:30 test. Cardiology Follow-up with Dr. Percival Spanish on 08/14/2019 at 3:40 PM in [facility].  Contact information: [place] [address] [phone]          Allergies  Allergen Reactions  . Avapro [Irbesartan] Other (See Comments)    "doesnt sit right with me"--light headed  . Lipitor [Atorvastatin Calcium] Other (See Comments)    Leg pain   Allergies as of 07/10/2019      Reactions   Avapro [irbesartan] Other (See Comments)   "doesnt sit right with me"--light headed   Lipitor [atorvastatin Calcium] Other (See Comments)   Leg pain      Medication List    TAKE these medications   Accu-Chek Aviva Plus test strip Generic drug: glucose blood Test BS QID Dx E11.9   Accu-Chek Aviva Plus w/Device Kit Test BS QID DX E11.9   Accu-Chek FastClix Lancets Misc Test BS QID Dx E11.9   acetaminophen 325 MG tablet Commonly known as: TYLENOL Take 650 mg by mouth every 6 (six) hours as needed.   albuterol 108 (90 Base) MCG/ACT inhaler Commonly known as: VENTOLIN HFA Inhale 2 puffs into the lungs every 6 (six) hours as needed for wheezing or shortness of breath.   ALPRAZolam 0.25 MG tablet Commonly known as: XANAX Take 1 tablet (0.25 mg total) by mouth 3 (three) times daily as needed. for anxiety   B-D SINGLE USE SWABS REGULAR Pads Test BS QID Dx E11.9   cephALEXin 500 MG capsule Commonly known as: KEFLEX Take 500 mg by mouth 4 (four) times daily.   Eliquis 5 MG Tabs tablet Generic drug: apixaban Take 1 tablet by mouth twice daily   furosemide 40 MG tablet Commonly known as: LASIX Take 1-2 tablets (40-80 mg total) by mouth 2 (two) times daily. Patient takes 17m in the morning and 425min the evening   insulin NPH Human 100 UNIT/ML injection Commonly known as: NovoLIN N ReliOn Inject 0.5-0.55 mLs (50-55 Units total) into the skin 2 (two) times daily  before a meal.   insulin regular 100 units/mL injection Commonly known as: NOVOLIN R Inject 15 units before breakfast and supper What changed:   how much to take  how to take this  when to take this   ipratropium-albuterol 0.5-2.5 (3) MG/3ML Soln Commonly known as: DUONEB Take 3 mLs by nebulization every 6 (six) hours as needed.   Lancing Device Misc USE AS DIRECTED   metoprolol tartrate 100 MG tablet Commonly known as: LOPRESSOR Take 1 tablet by mouth twice daily   nystatin powder Commonly known as: MYCOSTATIN/NYSTOP Apply topically 2 (two) times daily. Apply to groin area and scrotum skin; keep area clean and dry.   oxymetazoline 0.05 % nasal spray Commonly known as: AFRIN Place 1 spray into both nostrils 2 (  two) times daily as needed for congestion.   Ozempic (1 MG/DOSE) 2 MG/1.5ML Sopn Generic drug: Semaglutide (1 MG/DOSE) Inject 1 mg into the skin once a week.   PARoxetine 20 MG tablet Commonly known as: PAXIL Take 1 tablet by mouth once daily   simvastatin 40 MG tablet Commonly known as: ZOCOR Take 1 tablet by mouth once daily   Trelegy Ellipta 100-62.5-25 MCG/INH Aepb Generic drug: Fluticasone-Umeclidin-Vilant Take 1 puff by mouth daily.   Vitamin D3 125 MCG (5000 UT) Tabs Take 1 tablet by mouth every morning.       Procedures/Studies: DG Chest 2 View  Result Date: 07/04/2019 CLINICAL DATA:  Dyspnea. EXAM: CHEST - 2 VIEW COMPARISON:  June 25, 2018 FINDINGS: There is stable upper limits of normal heart size with prominent pericardial fat pad extending into the bilateral costophrenic angles. Inspiration is shallow. There is chronic centrilobular emphysema. Mild bilateral infrahilar bronchitis projects on the cardiophrenic angles. There is no pulmonary edema, obvious pleural effusion, pneumothorax or pneumonia. Moderate skeletal degenerative changes and aortic knob calcified atherosclerosis are present. IMPRESSION: Mild centrilobular emphysema with  infrahilar bronchitis. Prominent pericardial fat pad, which degrades evaluation for pleural fluid. No pneumonia or pulmonary edema. Electronically Signed   By: Revonda Humphrey   On: 07/04/2019 23:18   CT Angio Chest PE W/Cm &/Or Wo Cm  Result Date: 07/08/2019 CLINICAL DATA:  Shortness of breath EXAM: CT ANGIOGRAPHY CHEST WITH CONTRAST TECHNIQUE: Multidetector CT imaging of the chest was performed using the standard protocol during bolus administration of intravenous contrast. Multiplanar CT image reconstructions and MIPs were obtained to evaluate the vascular anatomy. CONTRAST:  125m OMNIPAQUE IOHEXOL 350 MG/ML SOLN COMPARISON:  CTA chest 11/08/2018 FINDINGS: Cardiovascular: Contrast injection is sufficient to demonstrate satisfactory opacification of the pulmonary arteries to the segmental level. There is no pulmonary embolus or evidence of right heart strain. The size of the main pulmonary artery is normal. There is a large pericardial effusion that measures 2.7 cm thickness, new since the prior scan of 11/08/2018. The course and caliber of the aorta are normal. There is mild atherosclerotic calcification. Opacification decreased due to pulmonary arterial phase contrast bolus timing. Mediastinum/Nodes: Multiple subcentimeter mediastinal lymph nodes. Lungs/Pleura: There are small pleural effusions, slightly larger on the right. Bibasilar atelectasis. Airways are patent. Upper Abdomen: Contrast bolus timing is not optimized for evaluation of the abdominal organs. There is cholelithiasis. No acute findings. Musculoskeletal: No chest wall abnormality. No bony spinal canal stenosis. Review of the MIP images confirms the above findings. IMPRESSION: 1. No pulmonary embolus. 2. Large pericardial effusion, new since the prior scan. 3. Small pleural effusions with associated bibasilar atelectasis. Electronically Signed   By: KUlyses JarredM.D.   On: 07/08/2019 19:18   DG Chest Port 1 View  Result Date:  07/08/2019 CLINICAL DATA:  Shortness of breath. EXAM: PORTABLE CHEST 1 VIEW COMPARISON:  July 04, 2019. FINDINGS: Stable cardiomegaly. Left lung base is not entirely included in field-of-view. Bibasilar opacities are noted concerning for atelectasis or infiltrates with possible effusions. No pneumothorax is noted. Bony thorax is unremarkable. IMPRESSION: Bibasilar opacities are noted concerning for atelectasis or infiltrates with possible effusions. Exam is limited as left lung base is not entirely included in field-of-view. Electronically Signed   By: JMarijo ConceptionM.D.   On: 07/08/2019 16:37   DG Chest Port 1 View  Result Date: 06/25/2019 CLINICAL DATA:  Shortness of breath EXAM: PORTABLE CHEST 1 VIEW COMPARISON:  11/01/2018 FINDINGS: The heart size and mediastinal  contours are stable. Calcific aortic knob. Mild pulmonary vascular congestion. Minimal linear scarring versus atelectasis in the left lung base. No focal airspace consolidation, pleural effusion, or pneumothorax. The visualized skeletal structures are unremarkable. IMPRESSION: Mild pulmonary vascular congestion. Electronically Signed   By: Davina Poke D.O.   On: 06/25/2019 11:53   ECHOCARDIOGRAM COMPLETE  Result Date: 06/26/2019    ECHOCARDIOGRAM REPORT   Patient Name:   Justin Ruiz Date of Exam: 06/26/2019 Medical Rec #:  [id number]      Height:       69.0 in Accession #:    [id number]     Weight:       295.4 lb Date of Birth:  1951/03/26      BSA:          2.439 m Patient Age:    69 years       BP:           142/82 mmHg Patient Gender: M              HR:           106 bpm. Exam Location:  [facility] Procedure: 2D Echo Indications:    CHF-Acute Diastolic 269.48 / N46.27  History:        Patient has prior history of Echocardiogram examinations, most                 recent 03/29/2016. CHF, COPD, Arrythmias:Atrial Fibrillation;                 Risk Factors:Diabetes, Dyslipidemia, Hypertension and Former                 Smoker.  Sonographer:     Leavy Cella RDCS (AE) Referring Phys: [phone] DAVID TAT IMPRESSIONS  1. Left ventricular ejection fraction, by estimation, is 55 to 60%. The left ventricle has normal function. The left ventricle has no regional wall motion abnormalities. There is mild left ventricular hypertrophy. Left ventricular diastolic parameters are indeterminate.  2. Right ventricular systolic function is mildly reduced. The right ventricular size is mildly enlarged. There is mildly elevated pulmonary artery systolic pressure.  3. Left atrial size was severely dilated.  4. The mitral valve is normal in structure and function. No evidence of mitral valve regurgitation. No evidence of mitral stenosis.  5. The aortic valve has an indeterminant number of cusps. Aortic valve regurgitation is not visualized. No aortic stenosis is present.  6. The inferior vena cava is normal in size with greater than 50% respiratory variability, suggesting right atrial pressure of 3 mmHg. FINDINGS  Left Ventricle: Left ventricular ejection fraction, by estimation, is 55 to 60%. The left ventricle has normal function. The left ventricle has no regional wall motion abnormalities. The left ventricular internal cavity size was small. There is mild left ventricular hypertrophy. Left ventricular diastolic parameters are indeterminate. Right Ventricle: The right ventricular size is mildly enlarged. Right vetricular wall thickness was not assessed. Right ventricular systolic function is mildly reduced. There is mildly elevated pulmonary artery systolic pressure. The tricuspid regurgitant velocity is 2.68 m/s, and with an assumed right atrial pressure of 10 mmHg, the estimated right ventricular systolic pressure is 09.3 mmHg. Left Atrium: Left atrial size was severely dilated. Right Atrium: Right atrial size was not well visualized. Pericardium: A small pericardial effusion is present. The pericardial effusion is circumferential. Mitral Valve: The mitral valve is normal  in structure and function. No evidence of mitral valve regurgitation. No evidence of mitral  valve stenosis. Tricuspid Valve: The tricuspid valve is normal in structure. Tricuspid valve regurgitation is trivial. No evidence of tricuspid stenosis. Aortic Valve: The aortic valve has an indeterminant number of cusps. . There is mild thickening and mild calcification of the aortic valve. Aortic valve regurgitation is not visualized. No aortic stenosis is present. Mild aortic valve annular calcification. There is mild thickening of the aortic valve. There is mild calcification of the aortic valve. Aortic valve mean gradient measures 3.4 mmHg. Aortic valve peak gradient measures 6.7 mmHg. Aortic valve area, by VTI measures 1.41 cm. Pulmonic Valve: The pulmonic valve was not well visualized. Pulmonic valve regurgitation is not visualized. No evidence of pulmonic stenosis. Aorta: The aortic root is normal in size and structure. Pulmonary Artery: Indeterminant PASP, inadequate TR jet. Venous: The inferior vena cava is normal in size with greater than 50% respiratory variability, suggesting right atrial pressure of 3 mmHg. IAS/Shunts: No atrial level shunt detected by color flow Doppler.  LEFT VENTRICLE PLAX 2D LVOT diam:     2.10 cm  Diastology LV SV:         36       LV e' lateral:   8.18 cm/s LV SV Index:   15       LV E/e' lateral: 12.2 LVOT Area:     3.46 cm LV e' medial:    8.48 cm/s                         LV E/e' medial:  11.8  RIGHT VENTRICLE RV S prime:     8.92 cm/s TAPSE (M-mode): 1.4 cm LEFT ATRIUM              Index       RIGHT ATRIUM           Index LA Vol (A2C):   93.6 ml  38.38 ml/m RA Area:     18.40 cm LA Vol (A4C):   126.0 ml 51.67 ml/m RA Volume:   49.80 ml  20.42 ml/m LA Biplane Vol: 109.0 ml 44.70 ml/m  AORTIC VALVE AV Area (Vmax):    1.70 cm AV Area (Vmean):   1.60 cm AV Area (VTI):     1.41 cm AV Vmax:           129.71 cm/s AV Vmean:          85.586 cm/s AV VTI:            0.256 m AV Peak  Grad:      6.7 mmHg AV Mean Grad:      3.4 mmHg LVOT Vmax:         63.54 cm/s LVOT Vmean:        39.503 cm/s LVOT VTI:          0.104 m LVOT/AV VTI ratio: 0.41 MITRAL VALVE               TRICUSPID VALVE MV Area (PHT): 4.49 cm    TR Peak grad:   28.7 mmHg MV Decel Time: 169 msec    TR Vmax:        268.00 cm/s MV E velocity: 99.90 cm/s MV A velocity: 21.30 cm/s  SHUNTS MV E/A ratio:  4.69        Systemic VTI:  0.10 m                            Systemic  Diam: 2.10 cm Carlyle Dolly MD Electronically signed by Carlyle Dolly MD Signature Date/Time: 06/26/2019/12:47:29 PM    Final    ECHOCARDIOGRAM LIMITED  Result Date: 07/09/2019    ECHOCARDIOGRAM LIMITED REPORT   Patient Name:   Justin PRUDEN Date of Exam: 07/09/2019 Medical Rec #:  [id number]      Height:       69.0 in Accession #:    [id number]     Weight:       296.3 lb Date of Birth:  [birth year]      BSA:          2.442 m Patient Age:    55 years       BP:           105/78 mmHg Patient Gender: M              HR:           81 bpm. Exam Location:  [facility] Procedure: Limited Echo and Saline Contrast Bubble Study Indications:    Pericardial effusion 423.9 / I31.3  History:        Patient has prior history of Echocardiogram examinations, most                 recent 06/26/2019. CHF, COPD, Arrythmias:Atrial Fibrillation; Risk                 Factors:Diabetes, Hypertension and Dyslipidemia. Pericardial                 Effusion, NHL (non-Hodgkin's lymphoma.  Sonographer:    Leavy Cella RDCS (AE) Referring Phys: [id number] [place]  1. Left ventricular ejection fraction, by estimation, is 55 to 60%. The left ventricle has normal function. The left ventricle has no regional wall motion abnormalities. There is mild concentric left ventricular hypertrophy. Left ventricular diastolic parameters are indeterminate.  2. Right ventricular systolic function is mildly reduced. The right ventricular size is mildly enlarged.  3. Left atrial size was mildly  dilated.  4. Right atrial size was mildly dilated.  5. Moderate pericardial effusion. The pericardial effusion is circumferential. There is no evidence of cardiac tamponade.  6. The mitral valve is degenerative. No evidence of mitral valve regurgitation.  7. The aortic valve was not well visualized. No aortic stenosis is present.  8. The inferior vena cava is normal in size with greater than 50% respiratory variability, suggesting right atrial pressure of 3 mmHg. FINDINGS  Left Ventricle: Left ventricular ejection fraction, by estimation, is 55 to 60%. The left ventricle has normal function. The left ventricle has no regional wall motion abnormalities. The left ventricular internal cavity size was normal in size. There is  mild concentric left ventricular hypertrophy. Right Ventricle: The right ventricular size is mildly enlarged. Right ventricular systolic function is mildly reduced. Left Atrium: Left atrial size was mildly dilated. Right Atrium: Right atrial size was mildly dilated. Pericardium: A moderately sized pericardial effusion is present. The pericardial effusion is circumferential. There is no evidence of cardiac tamponade. Mitral Valve: The mitral valve is degenerative in appearance. Mild mitral annular calcification. Aortic Valve: The aortic valve was not well visualized. . There is mild thickening and mild calcification of the aortic valve. No aortic stenosis is present. Mild aortic valve annular calcification. There is mild thickening of the aortic valve. There is mild calcification of the aortic valve. Pulmonic Valve: The pulmonic valve was not well visualized. Pulmonic valve regurgitation is not visualized. Aorta: The aortic root  is normal in size and structure. Venous: The inferior vena cava is normal in size with greater than 50% respiratory variability, suggesting right atrial pressure of 3 mmHg. IAS/Shunts: No atrial level shunt detected by color flow Doppler. Agitated saline contrast was given  intravenously to evaluate for intracardiac shunting.  LEFT VENTRICLE PLAX 2D LVIDd:         4.87 cm LVIDs:         3.77 cm LV PW:         1.02 cm LV IVS:        1.23 cm LVOT diam:     2.00 cm LVOT Area:     3.14 cm  LEFT ATRIUM         Index LA diam:    4.70 cm 1.92 cm/m   AORTA Ao Root diam: 3.40 cm MITRAL VALVE MV Area (PHT): 4.31 cm     SHUNTS MV Decel Time: 176 msec     Systemic Diam: 2.00 cm MV E velocity: 109.00 cm/s MV A velocity: 37.30 cm/s MV E/A ratio:  2.92 Kate Sable MD Electronically signed by Kate Sable MD Signature Date/Time: 07/09/2019/10:57:31 AM    Final       Subjective: Pt reports that he has SOB but says relieved when he takes his xanax.     Discharge Exam: Vitals:   07/10/19 0728 07/10/19 0732  BP:    Pulse:    Resp:    Temp:    SpO2: 97% 97%   Vitals:   07/10/19 0524 07/10/19 0723 07/10/19 0728 07/10/19 0732  BP: 118/76     Pulse: 90     Resp: 20     Temp: 98.3 F (36.8 C)     TempSrc: Oral     SpO2: 97% 97% 97% 97%  Weight: 135.6 kg     Height:       General: Anxious appearing morbidly obese male, NAD, Pt is alert, awake, not in acute distress Cardiovascular: normal S1/S2 +, no rubs, no gallops Respiratory: CTA bilaterally, no wheezing, no rhonchi Abdominal: Soft, NT, ND, bowel sounds + Extremities: no pretibial edema, no cyanosis   The results of significant diagnostics from this hospitalization (including imaging, microbiology, ancillary and laboratory) are listed below for reference.     Microbiology: Recent Results (from the past 240 hour(s))  SARS CORONAVIRUS 2 (TAT 6-24 HRS) Nasopharyngeal Nasopharyngeal Swab     Status: None   Collection Time: 07/08/19  9:04 PM   Specimen: Nasopharyngeal Swab  Result Value Ref Range Status   SARS Coronavirus 2 NEGATIVE NEGATIVE Final    Comment: (NOTE) SARS-CoV-2 target nucleic acids are NOT DETECTED. The SARS-CoV-2 RNA is generally detectable in upper and lower respiratory specimens during  the acute phase of infection. Negative results do not preclude SARS-CoV-2 infection, do not rule out co-infections with other pathogens, and should not be used as the sole basis for treatment or other patient management decisions. Negative results must be combined with clinical observations, patient history, and epidemiological information. The expected result is Negative. Fact Sheet for Patients: SugarRoll.be Fact Sheet for Healthcare Providers: https://www.woods-mathews.com/ This test is not yet approved or cleared by the [place] FDA and  has been authorized for detection and/or diagnosis of SARS-CoV-2 by FDA under an Emergency Use Authorization (EUA). This EUA will remain  in effect (meaning this test can be used) for the duration of the COVID-19 declaration under Section 56 4(b)(1) of the Act, 21 U.S.C. section 360bbb-3(b)(1), unless the authorization is  terminated or revoked sooner. Performed at [facility], [place] [address], [place], [place] [address]      Labs: BNP (last 3 results) Recent Labs    06/25/19 1120 07/04/19 1408 07/08/19 1628  BNP 1,520.0* 159.0* 604.5*   Basic Metabolic Panel: Recent Labs  Lab 07/04/19 1408 07/08/19 1628 07/09/19 0506 07/10/19 0527  NA 140 137 133* 133*  K 4.2 4.0 4.2 4.9  CL 95* 91* 88* 88*  CO2 32* 36* 36* 36*  GLUCOSE 45* 98 248* 191*  BUN 23 28* 28* 29*  CREATININE 1.02 1.20 1.13 1.18  CALCIUM 9.2 8.9 8.7* 8.9  MG  --   --   --  1.8   Liver Function Tests: Recent Labs  Lab 07/04/19 1408 07/08/19 1628 07/09/19 0506  AST 14 15 14*  ALT '10 11 12  ' ALKPHOS 99 96 95  BILITOT 0.7 1.8* 1.5*  PROT 6.6 7.4 7.0  ALBUMIN 3.6* 3.1* 2.9*   No results for input(s): LIPASE, AMYLASE in the last 168 hours. No results for input(s): AMMONIA in the last 168 hours. CBC: Recent Labs  Lab 07/04/19 1408 07/08/19 1628 07/09/19 0506  WBC 7.4 9.9 10.4  NEUTROABS 5.4 7.8* 8.2*  HGB  12.1* 11.9* 11.4*  HCT 36.6* 38.5* 36.7*  MCV 92 95.1 95.8  PLT 301 287 267   Cardiac Enzymes: No results for input(s): CKTOTAL, CKMB, CKMBINDEX, TROPONINI in the last 168 hours. BNP: Invalid input(s): POCBNP CBG: Recent Labs  Lab 07/09/19 2022 07/09/19 2348 07/10/19 0439 07/10/19 0738 07/10/19 1121  GLUCAP 201* 164* 179* 178* 347*   D-Dimer No results for input(s): DDIMER in the last 72 hours. Hgb A1c No results for input(s): HGBA1C in the last 72 hours. Lipid Profile No results for input(s): CHOL, HDL, LDLCALC, TRIG, CHOLHDL, LDLDIRECT in the last 72 hours. Thyroid function studies Recent Labs    07/08/19 1628  TSH 1.256   Anemia work up No results for input(s): VITAMINB12, FOLATE, FERRITIN, TIBC, IRON, RETICCTPCT in the last 72 hours. Urinalysis    Component Value Date/Time   COLORURINE YELLOW 06/25/2019 1133   APPEARANCEUR CLEAR 06/25/2019 1133   APPEARANCEUR Clear 05/30/2018 1147   LABSPEC 1.026 06/25/2019 1133   PHURINE 5.0 06/25/2019 1133   GLUCOSEU >=500 (A) 06/25/2019 1133   HGBUR SMALL (A) 06/25/2019 1133   BILIRUBINUR NEGATIVE 06/25/2019 1133   BILIRUBINUR Negative 05/30/2018 1147   KETONESUR 20 (A) 06/25/2019 1133   PROTEINUR NEGATIVE 06/25/2019 1133   UROBILINOGEN negative 05/01/2013 1115   UROBILINOGEN 0.2 05/18/2010 0352   NITRITE NEGATIVE 06/25/2019 1133   LEUKOCYTESUR NEGATIVE 06/25/2019 1133   Sepsis Labs Invalid input(s): PROCALCITONIN,  WBC,  LACTICIDVEN Microbiology Recent Results (from the past 240 hour(s))  SARS CORONAVIRUS 2 (TAT 6-24 HRS) Nasopharyngeal Nasopharyngeal Swab     Status: None   Collection Time: 07/08/19  9:04 PM   Specimen: Nasopharyngeal Swab  Result Value Ref Range Status   SARS Coronavirus 2 NEGATIVE NEGATIVE Final    Comment: (NOTE) SARS-CoV-2 target nucleic acids are NOT DETECTED. The SARS-CoV-2 RNA is generally detectable in upper and lower respiratory specimens during the acute phase of infection.  Negative results do not preclude SARS-CoV-2 infection, do not rule out co-infections with other pathogens, and should not be used as the sole basis for treatment or other patient management decisions. Negative results must be combined with clinical observations, patient history, and epidemiological information. The expected result is Negative. Fact Sheet for Patients: SugarRoll.be Fact Sheet for Healthcare Providers:  https://www.woods-mathews.com/ This test is not yet approved or cleared by the [place] and  has been authorized for detection and/or diagnosis of SARS-CoV-2 by FDA under an Emergency Use Authorization (EUA). This EUA will remain  in effect (meaning this test can be used) for the duration of the COVID-19 declaration under Section 56 4(b)(1) of the Act, 21 U.S.C. section 360bbb-3(b)(1), unless the authorization is terminated or revoked sooner. Performed at [facility], [place] [address], [place], [place] [address]    Time coordinating discharge: 37 minutes   SIGNED:  Irwin Brakeman, MD  [facility] 07/10/2019, 11:46 AM How to contact the [facility] Attending or Consulting provider [place] or covering provider during after hours [place], for this patient?  1. Check the care team in [facility] and look for a) attending/consulting TRH provider listed and b) the [facility] team listed 2. Log into [email or web address] and use [place]'s universal password to access. If you do not have the password, please contact the hospital operator. 3. Locate the [facility] provider you are looking for under [facility] and page to a number that you can be directly reached. 4. If you still have difficulty reaching the provider, please page the [facility] (Director on Call) for the Hospitalists listed on amion for assistance.

## 2019-07-10 NOTE — Plan of Care (Signed)

## 2019-07-10 NOTE — Telephone Encounter (Signed)
Rep called stating that they recently received a prescription for inhaler for patient and need verification on directions to give for the inhaler. (unsure of name of inhaler because Rep didn't speak clear)

## 2019-07-10 NOTE — Care Management Important Message (Signed)
Important Message  Patient Details  Name: Justin Ruiz MRN: [id number] Date of Birth: [birth year]   Medicare Important Message Given:  Yes     Tommy Medal 07/10/2019, 1:10 PM

## 2019-07-10 NOTE — Progress Notes (Signed)
Nsg Discharge Note  Admit Date:  07/08/2019 Discharge date: 07/10/2019   Justin Ruiz to be D/C'd Home per MD order.  AVS completed.  Copy for chart, and copy for patient signed, and dated. Removed IV-clean, dry, intact. Reviewed d/c paperwork with patient and wife. Answered all questions. Wheeled stable patient ansd belongings to main entrance where he was picked up by his wife to d/c to home. Patient/caregiver able to verbalize understanding.  Discharge Medication: Allergies as of 07/10/2019      Reactions   Avapro [irbesartan] Other (See Comments)   "doesnt sit right with me"--light headed   Lipitor [atorvastatin Calcium] Other (See Comments)   Leg pain      Medication List    TAKE these medications   Accu-Chek Aviva Plus test strip Generic drug: glucose blood Test BS QID Dx E11.9   Accu-Chek Aviva Plus w/Device Kit Test BS QID DX E11.9   Accu-Chek FastClix Lancets Misc Test BS QID Dx E11.9   acetaminophen 325 MG tablet Commonly known as: TYLENOL Take 650 mg by mouth every 6 (six) hours as needed.   albuterol 108 (90 Base) MCG/ACT inhaler Commonly known as: VENTOLIN HFA Inhale 2 puffs into the lungs every 6 (six) hours as needed for wheezing or shortness of breath.   ALPRAZolam 0.25 MG tablet Commonly known as: XANAX Take 1 tablet (0.25 mg total) by mouth 3 (three) times daily as needed. for anxiety   B-D SINGLE USE SWABS REGULAR Pads Test BS QID Dx E11.9   cephALEXin 500 MG capsule Commonly known as: KEFLEX Take 500 mg by mouth 4 (four) times daily.   Eliquis 5 MG Tabs tablet Generic drug: apixaban Take 1 tablet by mouth twice daily   furosemide 40 MG tablet Commonly known as: LASIX Take 1-2 tablets (40-80 mg total) by mouth 2 (two) times daily. Patient takes 11m in the morning and 465min the evening   insulin NPH Human 100 UNIT/ML injection Commonly known as: NovoLIN N ReliOn Inject 0.5-0.55 mLs (50-55 Units total) into the skin 2 (two) times daily  before a meal.   insulin regular 100 units/mL injection Commonly known as: NOVOLIN R Inject 15 units before breakfast and supper What changed:   how much to take  how to take this  when to take this   ipratropium-albuterol 0.5-2.5 (3) MG/3ML Soln Commonly known as: DUONEB Take 3 mLs by nebulization every 6 (six) hours as needed.   Lancing Device Misc USE AS DIRECTED   metoprolol tartrate 100 MG tablet Commonly known as: LOPRESSOR Take 1 tablet by mouth twice daily   nystatin powder Commonly known as: MYCOSTATIN/NYSTOP Apply topically 2 (two) times daily. Apply to groin area and scrotum skin; keep area clean and dry.   oxymetazoline 0.05 % nasal spray Commonly known as: AFRIN Place 1 spray into both nostrils 2 (two) times daily as needed for congestion.   Ozempic (1 MG/DOSE) 2 MG/1.5ML Sopn Generic drug: Semaglutide (1 MG/DOSE) Inject 1 mg into the skin once a week.   PARoxetine 20 MG tablet Commonly known as: PAXIL Take 1 tablet by mouth once daily   simvastatin 40 MG tablet Commonly known as: ZOCOR Take 1 tablet by mouth once daily   Trelegy Ellipta 100-62.5-25 MCG/INH Aepb Generic drug: Fluticasone-Umeclidin-Vilant Take 1 puff by mouth daily.   Vitamin D3 125 MCG (5000 UT) Tabs Take 1 tablet by mouth every morning.       Discharge Assessment: Vitals:   07/10/19 0728 07/10/19 0732  BP:  Pulse:    Resp:    Temp:    SpO2: 97% 97%   Skin clean, dry and intact without evidence of skin break down, no evidence of skin tears noted. IV catheter discontinued intact. Site without signs and symptoms of complications - no redness or edema noted at insertion site, patient denies c/o pain - only slight tenderness at site.  Dressing with slight pressure applied.  D/c Instructions-Education: Discharge instructions given to patient/family with verbalized understanding. D/c education completed with patient/family including follow up instructions, medication list,  d/c activities limitations if indicated, with other d/c instructions as indicated by MD - patient able to verbalize understanding, all questions fully answered. Patient instructed to return to ED, call 911, or call MD for any changes in condition.  Patient escorted via [place], and D/C home via private auto.  Santa Lighter, RN 07/10/2019 3:39 PM

## 2019-07-11 ENCOUNTER — Other Ambulatory Visit: Payer: Self-pay | Admitting: Pharmacy Technician

## 2019-07-11 NOTE — Patient Outreach (Signed)
[name] Care Management  07/11/2019  Justin Ruiz 1950-05-12 [id number]   Care coordination call placed to [name] in regards to patient's application for Ozempic, Novolin N and [place] to [place] who informed patient was APPROVED 07/04/2019-04/24/2020. She informed the medications were all ordered on 07/04/2019 and should arrive at the provider's office in 10-14 business days from the 11th.  Care coordination call placed to [facility] in regards to patient's application for Spiriva Respimat.   Spoke to [name] who informed patient was APPROVED 07/08/2019-04/24/2020. She informed the medication order was shipped out yesterday but she had no tracking information. She informed the inhaler should arrive at the patient's home in the next 7-10 business days via [place].  Will follow up with patient in 10-14 business days to inquire if the above named medications have been received.  Colleene Swarthout P. Daud Cayer, [place]  [phone]

## 2019-07-12 ENCOUNTER — Encounter: Payer: Self-pay | Admitting: Family Medicine

## 2019-07-12 ENCOUNTER — Ambulatory Visit (INDEPENDENT_AMBULATORY_CARE_PROVIDER_SITE_OTHER): Payer: Medicare Other | Admitting: Family Medicine

## 2019-07-12 ENCOUNTER — Telehealth: Payer: Self-pay | Admitting: *Deleted

## 2019-07-12 ENCOUNTER — Ambulatory Visit: Payer: Medicare Other | Admitting: *Deleted

## 2019-07-12 DIAGNOSIS — I10 Essential (primary) hypertension: Secondary | ICD-10-CM

## 2019-07-12 DIAGNOSIS — J449 Chronic obstructive pulmonary disease, unspecified: Secondary | ICD-10-CM

## 2019-07-12 DIAGNOSIS — E782 Mixed hyperlipidemia: Secondary | ICD-10-CM | POA: Diagnosis not present

## 2019-07-12 DIAGNOSIS — E119 Type 2 diabetes mellitus without complications: Secondary | ICD-10-CM

## 2019-07-12 DIAGNOSIS — I5032 Chronic diastolic (congestive) heart failure: Secondary | ICD-10-CM

## 2019-07-12 DIAGNOSIS — F411 Generalized anxiety disorder: Secondary | ICD-10-CM | POA: Diagnosis not present

## 2019-07-12 DIAGNOSIS — Z794 Long term (current) use of insulin: Secondary | ICD-10-CM

## 2019-07-12 DIAGNOSIS — I5033 Acute on chronic diastolic (congestive) heart failure: Secondary | ICD-10-CM | POA: Diagnosis not present

## 2019-07-12 DIAGNOSIS — J9621 Acute and chronic respiratory failure with hypoxia: Secondary | ICD-10-CM | POA: Diagnosis not present

## 2019-07-12 NOTE — Progress Notes (Signed)
Subjective:    Patient ID: BRYTEN SHURTS, male    DOB: May 03, 1950, 69 y.o.   MRN: [id number]   HPI: KONSTANTY SEIDNER is a 69 y.o. male presenting for not wanting to get up. Can't breathe. Wife says he needs to be in a rehab facility. She is wearing out. He wants a nerve pill every time he is dyspneic. Xanax calms him down for a little while. Then the symptoms come back and he wants another one.  The patient is asking for them due to shortness of breath.  However he seems to also be very anxious about the shortness of breath and that is in turn relieved with the Xanax.  This leads to him associating Xanax with improvement in the shortness of breath.  He has no energy. Legs feel a little tight to touch says his wife. They both feel his breathing is as bad as it was when admitted the first time.  He was recently discharged from [facility] for the second time this month.  He came home 3 days ago.  Hollers "I can't breathe" all the time even during the phone visit, while his wife is giving history..  Glucose 233 this AM. Was 122-134 before hospitalization. O2 sats dropping into the 70s with minor activity suchas walking around the house.  Walking is very difficult for him.  Uses a walker and moves very slowly.  Goes to 93 when sitting.  He is using 3 L nasal cannula at all times even when the sats drop into the 70s Patient has plans to see cardiology apparently in a week or 2.  They are planning to recheck his pericardial effusion.  The report from recent hospitalization indicated that it was not causing a tamponade.  Therefore, they did not recommend pericardiocentesis.  They will continue to monitor to both.  Ms. Sinda Du is concerned that they are switching cardiologists on him.  He has been seeing Dr. Percival Spanish for many years and they want to continue to see him.   Depression screen [facility] 2/9 07/04/2019 03/07/2019 03/04/2019 02/20/2019 01/21/2019  Decreased Interest 0 1 1 0 0  Down, Depressed, Hopeless 0 1 1  0 0  PHQ - 2 Score 0 2 2 0 0  Altered sleeping - 1 1 - -  Tired, decreased energy - 0 1 - -  Change in appetite - 0 0 - -  Feeling bad or failure about yourself  - 1 0 - -  Trouble concentrating - 1 1 - -  Moving slowly or fidgety/restless - 0 1 - -  Suicidal thoughts - 3 0 - -  PHQ-9 Score - - 6 - -  Difficult doing work/chores - Somewhat difficult Somewhat difficult - -  Some recent data might be hidden     Relevant past medical, surgical, family and social history reviewed and updated as indicated.  Interim medical history since our last visit reviewed. Allergies and medications reviewed and updated.  ROS:  Review of Systems  Constitutional: Negative.   HENT: Negative.   Eyes: Negative for visual disturbance.  Respiratory: Positive for cough and shortness of breath.   Cardiovascular: Positive for chest pain. Negative for leg swelling.  Gastrointestinal: Negative for abdominal pain, diarrhea, nausea and vomiting.  Genitourinary: Negative for difficulty urinating.  Skin: Negative for rash.  Neurological: Positive for weakness. Negative for headaches.  Psychiatric/Behavioral: Positive for agitation and sleep disturbance. The patient is nervous/anxious.      Social History  Tobacco Use  Smoking Status Former Smoker   Packs/day: 1.00   Years: 40.00   Pack years: 40.00   Types: Cigarettes   Start date: 12/17/1968   Quit date: 10/18/2018   Years since quitting: 0.7  Smokeless Tobacco Never Used  Tobacco Comment   2 ppd; pt reports smoking 2-3 weeks ago, 5-6 cigs/daily       Objective:     Wt Readings from Last 3 Encounters:  07/10/19 298 lb 15.1 oz (135.6 kg)  07/04/19 298 lb (135.2 kg)  06/27/19 295 lb 3.1 oz (133.9 kg)     Exam deferred. Pt. [place] due to COVID 19. Phone visit performed.   Assessment & Plan:   1. COPD mixed type ([place])   2. Acute on chronic respiratory failure with hypoxia (HCC)   3. Acute on chronic diastolic CHF (congestive  heart failure) ([place])   4. Generalized anxiety disorder   5. Diabetes mellitus type 2, insulin dependent ([place])     No orders of the defined types were placed in this encounter.   No orders of the defined types were placed in this encounter.     Diagnoses and all orders for this visit:  COPD mixed type ([place])  Acute on chronic respiratory failure with hypoxia (HCC)  Acute on chronic diastolic CHF (congestive heart failure) (HCC)  Generalized anxiety disorder  Diabetes mellitus type 2, insulin dependent ([place])  Patient was discharged from the hospital 3 days ago and now symptomatic again with his dyspnea.  Have noticed his sats with minimal exertion are dropping into the 70s.  His wife is concerned that he is very agitated and anxious even when his sats are normal on his oxygen while sitting still.  He is constantly asking for Xanax.  I think the Xanax calms his anxiety that he feels from his hypoxemia and this leads to him feeling more relaxed and less subjectively dyspneic.  Unfortunately the respiratory depression of the Xanax may be playing against him as well.  I emphasized the need to limit that to no more than 3 tablets per 24-hour period of time.  It may be in fact time to wean him off of the medication but because of his other problems it would be easy to induce a seizure I am afraid.  As a result I am going to keep him on this low-dose of 1.5 lorazepam daily equivalents for the time being they would like for him to be in rehab, they being he and his wife.  I consulted with our chronic care management team, Ms. [name], who has been following him along as well.  During the morning of this visit she has worked on getting him placed into a rehab.  [facility] this morning.  They are awaiting insurance approval right now.  If they cannot get him into a rehab program for his cardiopulmonary situation then he will  need to go back to the emergency room for readmission due to his hypoxemia and close following of his pericardial effusion, COPD, diabetes, and congestive heart failure-all of which appear to be under poor control at this time.  Virtual Visit via telephone Note  I discussed the limitations, risks, security and privacy concerns of performing an evaluation and management service by telephone and the availability of in person appointments. The patient was identified with two identifiers. Pt.expressed understanding and agreed to proceed. Pt. Is at home. Dr. Livia Snellen is in his  office.  Follow Up Instructions:   I discussed the assessment and treatment plan with the patient. The patient was provided an opportunity to ask questions and all were answered. The patient agreed with the plan and demonstrated an understanding of the instructions.   The patient was advised to call back or seek an in-person evaluation if the symptoms worsen or if the condition fails to improve as anticipated.   Total minutes including chart review and phone contact time: 38   Follow up plan: Return in about 5 days (around 07/17/2019).  [name], MD [place]

## 2019-07-12 NOTE — Chronic Care Management (AMB) (Signed)
  Chronic Care Management   Follow Up Note   07/12/2019 Name: Justin Ruiz MRN: [id number] DOB: 1950-07-30  Referred by: [name], MD Reason for referral : Chronic Care Management (Care Coordination)   Justin Ruiz is a 69 y.o. year old male who is a primary care patient of Stacks, [name], MD. The CCM team was consulted for assistance with chronic disease management and care coordination needs.    Review of patient status, including review of consultants reports, relevant laboratory and other test results, and collaboration with appropriate care team members and the patient's provider was performed as part of comprehensive patient evaluation and provision of chronic care management services.     RN Care Plan   . SNF Placement       CARE PLAN ENTRY (see longtitudinal plan of care for additional care plan information)  Current Barriers:  Marland Kitchen Knowledge Deficits related to SNF placement . Decline in health and ability to care for himself . Increasing dyspnea and hypoxia with exertion . Need for SNF placement in patient with CHF, DM, COPD  Nurse Case Manager Clinical Goal(s):  Marland Kitchen Over the next 5 days, patient will work with Consulting civil engineer to address needs related to SNF Placement . Over the next 7 days, patient/family will be in communication with a SNF to discuss placement  Interventions:  . Consulted by PCP, Dr Livia Snellen regarding patient's decline and 2 recent hospitalizations . Very thorough chart review including recent office and hospital notes, telephone calls, test results, and medication list . Called multiple local SNFs to inquire about placement . Collaborated with Shirlee Limerick and [facility] [phone] regarding placement requirements and insurance approval . FL2 completed and securely emailed to Lynnea Ferrier, LPN . Collaborated with with Lynnea Ferrier, LPN and advised to fax the [facility] and specific notes to [facility] at [phone] . Talked with wife, Justin Ruiz, multiple times  throughout this process  o Advised that it is not likely [facility] will approve placement from home. Most often requires transfer to SNF after hospital discharge o Advised that she should receive a call from [place] with an update o Advised that if coverage is denied, the next option would be an ED visit with readmission so that patient can be transferred to SNF at discharge  Patient Self Care Activities:  . Performs ADL's independently . Unable to independently IADLs and requires some assistance with ADLs  Initial goal documentation         Plan:   The care management team will reach out to the patient again over the next 5 days.   [name], BSN, RN-BC Embedded Chronic Care Manager [facility] [address] Management Direct Dial: [phone]

## 2019-07-12 NOTE — Telephone Encounter (Signed)
FL2 and records have been faxed to [facility] [place].

## 2019-07-15 ENCOUNTER — Other Ambulatory Visit: Payer: Self-pay | Admitting: Pharmacist

## 2019-07-15 NOTE — Patient Outreach (Signed)
[facility]) Care Management  07/15/2019  JOURDYN SPAW [birth year] [id number]   [facility])  [place] Team    [facility] pharmacy case will be closed as our team is transitioning from the [place] Management Department into the [facility] Quality Department and will no longer be using [facility] for documentation purposes.   [facility] pharmacy technician will continue to assist patient with medication assistance program applications until complete.      [name], PharmD, [place] Clinical Pharmacist [phone]

## 2019-07-16 ENCOUNTER — Ambulatory Visit: Payer: Medicare Other

## 2019-07-16 ENCOUNTER — Telehealth: Payer: Self-pay | Admitting: Internal Medicine

## 2019-07-16 ENCOUNTER — Telehealth: Payer: Self-pay | Admitting: Cardiology

## 2019-07-16 NOTE — Telephone Encounter (Signed)
I don't see anywhere to put him in unless we have a cancellation.  Perhaps he would see an APP in  [facility].

## 2019-07-16 NOTE — Telephone Encounter (Signed)
Pts wife, [name] on [place], called to report the pt has been in and out of the hospital X 2 since the beginning of March 2021... he has been having exacerbation of his COPD.... he was noted to have a pericardial effusion on 07/08/19 echo and CT. He is now having to increase his home O2 as per a message to Baird Lyons MD today and to follow up with Cardio re: the fluid around his heart. Last BNP 07/08/19 was 156... near 1500 3 weeks ago.   Pt is scheduled for a follow up Echo at [facility] 07/31/19. But, she is asking to see cardio prior to then. She is asking to be seen only in [place] since she is his only caregiver and with him being a large man she would like to stay very close to home. Dr. Percival Spanish unavailable to see him for at least 2 weeks at that location especially. I will talk with the [facility] about getting him in to see another provider.   I offered the pts wife to see an APP here at the [place] office but she declined and would not let me talk her into letting him see any of the options that I gave her. Dr. Percival Spanish is back at the [facility] office 07/31/19 but his schedule is very full... will forward to Dr. Percival Spanish his nurses for review and recommendations. He is already scheduled for 08/14/19 but I am unable to make anything sooner with her preferences.

## 2019-07-16 NOTE — Telephone Encounter (Signed)
Suggest he turn O2 up to 4-5L if needed, only while up and active, to keep saturation over 88. Turn back to 3L if sitting or lying down.  If getting worse- contact cardiology , since fluid retention is likely causing change.

## 2019-07-16 NOTE — Telephone Encounter (Signed)
Patient's wife calling stating the patient saw Dr. Annamaria Boots and was told to try and get a sooner appointment then 08/14/2019, because of the fluid around his heart. She also states the appointment needs to be in [place].

## 2019-07-16 NOTE — Telephone Encounter (Signed)
Spoke with pt's wife, [name]. She is aware of CY's response. Nothing further was needed.

## 2019-07-16 NOTE — Patient Instructions (Signed)
Visit Information  Goals Addressed            This Visit's Progress   . Patient Stated       [place] (see longtitudinal plan of care for additional care plan information)  Current Barriers:  Marland Kitchen Knowledge Deficits related to SNF placement . Decline in health and ability to care for himself . Increasing dyspnea and hypoxia with exertion  Nurse Case Manager Clinical Goal(s):  Marland Kitchen Over the next 5 days, patient will work with Consulting civil engineer to address needs related to SNF Placement . Over the next 7 days, patient/family will be in communication with a SNF to discuss placement  Interventions:  . Consulted by PCP, Dr Livia Snellen regarding patient's decline and 2 recent hospitalizations . Very thorough chart review including recent office and hospital notes, telephone calls, test results, and medication list . Called multiple local SNFs to inquire about placement . Collaborated with Shirlee Limerick and [facility] [phone] regarding placement requirements and insurance approval . FL2 completed and securely emailed to Lynnea Ferrier, LPN . Collaborated with with Lynnea Ferrier, LPN and advised to fax the [facility] and specific notes to [place] at [phone] . Talked with wife, [name], multiple times throughout this process  o Advised that it is not likely [facility] will approve placement from home. Most often requires transfer to SNF after hospital discharge o Advised that she should receive a call from [facility] with an update o Advised that if coverage is denied, the next option would be an ED visit with readmission so that patient can be transferred to SNF at discharge  Patient Self Care Activities:  . Performs ADL's independently . Unable to independently IADLs and requires some assistance with ADLs  Initial goal documentation        The patient verbalized understanding of instructions provided today and declined a print copy of patient instruction materials.   Follow-up Plan The care  management team will reach out to the patient again over the next 5 days.   [name], BSN, RN-BC Embedded Chronic Care Manager [facility] [place] [facility] Direct Dial: [phone]

## 2019-07-16 NOTE — Telephone Encounter (Signed)
Called and spoke with pt's wife [name] who states pt does wear O2 at 3L 24/7.  [name] stated when pt is up ambulating pt's sats drop to the mid-high 70s to the low 80s on his O2. Pt was recently  Hospitalized 3/15-3/17 due to acute pericardial effusion. [name] states that pt is unable to breathe when pt is up moving around and is unable to do little tasks by himself (eg. Showering).  Pt is taking meds as directed.  [name] stated she was unsure if she should contact [facility] about pt or cardiology. Dr. Annamaria Boots, please advise if you have any recommendations for both pt and wife [name].  Allergies  Allergen Reactions  . Avapro [Irbesartan] Other (See Comments)    "doesnt sit right with me"--light headed  . Lipitor [Atorvastatin Calcium] Other (See Comments)    Leg pain     Current Outpatient Medications:  .  Accu-Chek FastClix Lancets MISC, Test BS QID Dx E11.9, Disp: 402 each, Rfl: 3 .  acetaminophen (TYLENOL) 325 MG tablet, Take 650 mg by mouth every 6 (six) hours as needed., Disp: , Rfl:  .  albuterol (VENTOLIN HFA) 108 (90 Base) MCG/ACT inhaler, Inhale 2 puffs into the lungs every 6 (six) hours as needed for wheezing or shortness of breath., Disp: 1 Inhaler, Rfl: 6 .  Alcohol Swabs (B-D SINGLE USE SWABS REGULAR) PADS, Test BS QID Dx E11.9, Disp: 400 each, Rfl: 3 .  ALPRAZolam (XANAX) 0.25 MG tablet, Take 1 tablet (0.25 mg total) by mouth 3 (three) times daily as needed. for anxiety, Disp: 90 tablet, Rfl: 2 .  Blood Glucose Monitoring Suppl (ACCU-CHEK AVIVA PLUS) w/Device KIT, Test BS QID DX E11.9, Disp: 1 kit, Rfl: 0 .  cephALEXin (KEFLEX) 500 MG capsule, Take 500 mg by mouth 4 (four) times daily., Disp: , Rfl:  .  Cholecalciferol (VITAMIN D3) 5000 UNITS TABS, Take 1 tablet by mouth every morning., Disp: , Rfl:  .  ELIQUIS 5 MG TABS tablet, Take 1 tablet by mouth twice daily, Disp: 60 tablet, Rfl: 2 .  furosemide (LASIX) 40 MG tablet, Take 1-2 tablets (40-80 mg total) by mouth 2 (two) times  daily. Patient takes 30m in the morning and 48min the evening, Disp: , Rfl:  .  glucose blood (ACCU-CHEK AVIVA PLUS) test strip, Test BS QID Dx E11.9, Disp: 400 each, Rfl: 3 .  insulin NPH Human (NOVOLIN N RELION) 100 UNIT/ML injection, Inject 0.5-0.55 mLs (50-55 Units total) into the skin 2 (two) times daily before a meal., Disp: , Rfl:  .  insulin regular (NOVOLIN R) 100 units/mL injection, Inject 15 units before breakfast and supper (Patient taking differently: Inject 12-15 Units into the skin 2 (two) times daily before a meal. Inject 15 units before breakfast and supper), Disp: 10 mL, Rfl: 5 .  ipratropium-albuterol (DUONEB) 0.5-2.5 (3) MG/3ML SOLN, Take 3 mLs by nebulization every 6 (six) hours as needed., Disp: 1080 mL, Rfl: 3 .  Lancet Devices (LANCING DEVICE) MISC, USE AS DIRECTED, Disp: 1 each, Rfl: 2 .  metoprolol tartrate (LOPRESSOR) 100 MG tablet, Take 1 tablet by mouth twice daily, Disp: 180 tablet, Rfl: 0 .  nystatin (MYCOSTATIN/NYSTOP) powder, Apply topically 2 (two) times daily. Apply to groin area and scrotum skin; keep area clean and dry., Disp: 15 g, Rfl: 0 .  oxymetazoline (AFRIN) 0.05 % nasal spray, Place 1 spray into both nostrils 2 (two) times daily as needed for congestion., Disp: , Rfl:  .  PARoxetine (PAXIL) 20 MG tablet,  Take 1 tablet by mouth once daily, Disp: 90 tablet, Rfl: 0 .  Semaglutide, 1 MG/DOSE, (OZEMPIC, 1 MG/DOSE,) 2 MG/1.5ML SOPN, Inject 1 mg into the skin once a week., Disp: 2 pen, Rfl: 11 .  simvastatin (ZOCOR) 40 MG tablet, Take 1 tablet by mouth once daily, Disp: 90 tablet, Rfl: 1 .  TRELEGY ELLIPTA 100-62.5-25 MCG/INH AEPB, Take 1 puff by mouth daily., Disp: , Rfl:

## 2019-07-17 ENCOUNTER — Other Ambulatory Visit: Payer: Self-pay

## 2019-07-17 ENCOUNTER — Ambulatory Visit (INDEPENDENT_AMBULATORY_CARE_PROVIDER_SITE_OTHER): Payer: Medicare Other

## 2019-07-17 DIAGNOSIS — D472 Monoclonal gammopathy: Secondary | ICD-10-CM

## 2019-07-17 DIAGNOSIS — E11319 Type 2 diabetes mellitus with unspecified diabetic retinopathy without macular edema: Secondary | ICD-10-CM

## 2019-07-17 DIAGNOSIS — R911 Solitary pulmonary nodule: Secondary | ICD-10-CM

## 2019-07-17 DIAGNOSIS — Z794 Long term (current) use of insulin: Secondary | ICD-10-CM | POA: Diagnosis not present

## 2019-07-17 DIAGNOSIS — G4733 Obstructive sleep apnea (adult) (pediatric): Secondary | ICD-10-CM

## 2019-07-17 DIAGNOSIS — M1990 Unspecified osteoarthritis, unspecified site: Secondary | ICD-10-CM | POA: Diagnosis not present

## 2019-07-17 DIAGNOSIS — E1165 Type 2 diabetes mellitus with hyperglycemia: Secondary | ICD-10-CM | POA: Diagnosis not present

## 2019-07-17 DIAGNOSIS — I110 Hypertensive heart disease with heart failure: Secondary | ICD-10-CM

## 2019-07-17 DIAGNOSIS — I5033 Acute on chronic diastolic (congestive) heart failure: Secondary | ICD-10-CM | POA: Diagnosis not present

## 2019-07-17 DIAGNOSIS — J449 Chronic obstructive pulmonary disease, unspecified: Secondary | ICD-10-CM

## 2019-07-17 DIAGNOSIS — I482 Chronic atrial fibrillation, unspecified: Secondary | ICD-10-CM

## 2019-07-17 DIAGNOSIS — Z87891 Personal history of nicotine dependence: Secondary | ICD-10-CM

## 2019-07-17 DIAGNOSIS — D696 Thrombocytopenia, unspecified: Secondary | ICD-10-CM

## 2019-07-17 DIAGNOSIS — N400 Enlarged prostate without lower urinary tract symptoms: Secondary | ICD-10-CM

## 2019-07-17 DIAGNOSIS — Z6841 Body Mass Index (BMI) 40.0 and over, adult: Secondary | ICD-10-CM

## 2019-07-17 DIAGNOSIS — J9621 Acute and chronic respiratory failure with hypoxia: Secondary | ICD-10-CM | POA: Diagnosis not present

## 2019-07-17 DIAGNOSIS — K219 Gastro-esophageal reflux disease without esophagitis: Secondary | ICD-10-CM | POA: Diagnosis not present

## 2019-07-17 DIAGNOSIS — I872 Venous insufficiency (chronic) (peripheral): Secondary | ICD-10-CM | POA: Diagnosis not present

## 2019-07-17 DIAGNOSIS — Z86010 Personal history of colonic polyps: Secondary | ICD-10-CM

## 2019-07-17 DIAGNOSIS — Z7901 Long term (current) use of anticoagulants: Secondary | ICD-10-CM | POA: Diagnosis not present

## 2019-07-17 DIAGNOSIS — E1151 Type 2 diabetes mellitus with diabetic peripheral angiopathy without gangrene: Secondary | ICD-10-CM | POA: Diagnosis not present

## 2019-07-17 DIAGNOSIS — L03116 Cellulitis of left lower limb: Secondary | ICD-10-CM | POA: Diagnosis not present

## 2019-07-17 DIAGNOSIS — E785 Hyperlipidemia, unspecified: Secondary | ICD-10-CM

## 2019-07-17 DIAGNOSIS — L03115 Cellulitis of right lower limb: Secondary | ICD-10-CM

## 2019-07-17 DIAGNOSIS — Z7951 Long term (current) use of inhaled steroids: Secondary | ICD-10-CM

## 2019-07-17 DIAGNOSIS — E1140 Type 2 diabetes mellitus with diabetic neuropathy, unspecified: Secondary | ICD-10-CM | POA: Diagnosis not present

## 2019-07-17 DIAGNOSIS — Z8572 Personal history of non-Hodgkin lymphomas: Secondary | ICD-10-CM

## 2019-07-18 ENCOUNTER — Other Ambulatory Visit: Payer: Self-pay | Admitting: Pharmacy Technician

## 2019-07-18 NOTE — Patient Outreach (Signed)
[facility]) Care Management  07/18/2019  Justin Ruiz 05-26-1950 [id number]    Care coordination call placed to [employer] in regards to patient's application for [facility] and Proventil HFA.  Spoke to April who informed they have not received/processed the application that was mailed to them on 07/04/2019. She suggested to try back next week.  Will follow up with [employer] in 2-3 business days.  [name] [name], [place]  [phone]

## 2019-07-19 ENCOUNTER — Ambulatory Visit: Payer: Medicare Other | Admitting: Family Medicine

## 2019-07-22 ENCOUNTER — Other Ambulatory Visit: Payer: Self-pay | Admitting: Pharmacy Technician

## 2019-07-22 ENCOUNTER — Ambulatory Visit: Payer: Medicare Other | Admitting: Pharmacist

## 2019-07-22 NOTE — Patient Outreach (Signed)
[facility]) Care Management  07/22/2019  NERICK HILLSON [birth year] [id number]  Care coordination call placed to [employer] in regards to patient's application for [facility] and Proventil.  Spoke to [place] who informed the application has not been received/processed. He informed at this point the patient would be DENIED for [facility] as the patient will have to be approved in 2 days and that will not be feasible at this point. The application for the Proventli HFA will still be honored and processed as normal once they receive the application.  Will route note to [place] for assistance in providing alternatives to [facility].  Will follow up with [employer] in 10-14 business days to inqire if they have processed the Proventil portion of the application.  Kamaree Berkel P. Hailey Miles, [place]  [phone]

## 2019-07-23 ENCOUNTER — Other Ambulatory Visit: Payer: Self-pay | Admitting: *Deleted

## 2019-07-24 DIAGNOSIS — Z7951 Long term (current) use of inhaled steroids: Secondary | ICD-10-CM | POA: Diagnosis not present

## 2019-07-24 DIAGNOSIS — J449 Chronic obstructive pulmonary disease, unspecified: Secondary | ICD-10-CM | POA: Diagnosis not present

## 2019-07-24 DIAGNOSIS — E785 Hyperlipidemia, unspecified: Secondary | ICD-10-CM | POA: Diagnosis not present

## 2019-07-24 DIAGNOSIS — L03115 Cellulitis of right lower limb: Secondary | ICD-10-CM | POA: Diagnosis not present

## 2019-07-24 DIAGNOSIS — R911 Solitary pulmonary nodule: Secondary | ICD-10-CM | POA: Diagnosis not present

## 2019-07-24 DIAGNOSIS — J9621 Acute and chronic respiratory failure with hypoxia: Secondary | ICD-10-CM | POA: Diagnosis not present

## 2019-07-24 DIAGNOSIS — K219 Gastro-esophageal reflux disease without esophagitis: Secondary | ICD-10-CM | POA: Diagnosis not present

## 2019-07-24 DIAGNOSIS — M1990 Unspecified osteoarthritis, unspecified site: Secondary | ICD-10-CM | POA: Diagnosis not present

## 2019-07-24 DIAGNOSIS — I482 Chronic atrial fibrillation, unspecified: Secondary | ICD-10-CM | POA: Diagnosis not present

## 2019-07-24 DIAGNOSIS — D472 Monoclonal gammopathy: Secondary | ICD-10-CM | POA: Diagnosis not present

## 2019-07-24 DIAGNOSIS — I872 Venous insufficiency (chronic) (peripheral): Secondary | ICD-10-CM | POA: Diagnosis not present

## 2019-07-24 DIAGNOSIS — E1151 Type 2 diabetes mellitus with diabetic peripheral angiopathy without gangrene: Secondary | ICD-10-CM | POA: Diagnosis not present

## 2019-07-24 DIAGNOSIS — G4733 Obstructive sleep apnea (adult) (pediatric): Secondary | ICD-10-CM | POA: Diagnosis not present

## 2019-07-24 DIAGNOSIS — D696 Thrombocytopenia, unspecified: Secondary | ICD-10-CM | POA: Diagnosis not present

## 2019-07-24 DIAGNOSIS — I110 Hypertensive heart disease with heart failure: Secondary | ICD-10-CM | POA: Diagnosis not present

## 2019-07-24 DIAGNOSIS — E1165 Type 2 diabetes mellitus with hyperglycemia: Secondary | ICD-10-CM | POA: Diagnosis not present

## 2019-07-24 DIAGNOSIS — Z794 Long term (current) use of insulin: Secondary | ICD-10-CM | POA: Diagnosis not present

## 2019-07-24 DIAGNOSIS — E11319 Type 2 diabetes mellitus with unspecified diabetic retinopathy without macular edema: Secondary | ICD-10-CM | POA: Diagnosis not present

## 2019-07-24 DIAGNOSIS — L03116 Cellulitis of left lower limb: Secondary | ICD-10-CM | POA: Diagnosis not present

## 2019-07-24 DIAGNOSIS — I5033 Acute on chronic diastolic (congestive) heart failure: Secondary | ICD-10-CM | POA: Diagnosis not present

## 2019-07-24 DIAGNOSIS — Z7901 Long term (current) use of anticoagulants: Secondary | ICD-10-CM | POA: Diagnosis not present

## 2019-07-24 DIAGNOSIS — E1140 Type 2 diabetes mellitus with diabetic neuropathy, unspecified: Secondary | ICD-10-CM | POA: Diagnosis not present

## 2019-07-25 ENCOUNTER — Ambulatory Visit (HOSPITAL_COMMUNITY)
Admission: RE | Admit: 2019-07-25 | Discharge: 2019-07-25 | Disposition: A | Discharge status: Home / Self Care | Payer: Medicare Other | Source: Ambulatory Visit | Attending: Student | Admitting: Student

## 2019-07-25 ENCOUNTER — Telehealth: Payer: Self-pay | Admitting: Family Medicine

## 2019-07-25 ENCOUNTER — Other Ambulatory Visit: Payer: Self-pay

## 2019-07-25 ENCOUNTER — Other Ambulatory Visit: Payer: Self-pay | Admitting: Pharmacy Technician

## 2019-07-25 DIAGNOSIS — I313 Pericardial effusion (noninflammatory): Secondary | ICD-10-CM | POA: Diagnosis not present

## 2019-07-25 DIAGNOSIS — I3139 Other pericardial effusion (noninflammatory): Secondary | ICD-10-CM

## 2019-07-25 NOTE — Patient Outreach (Signed)
[facility]) Care Management  07/25/2019  [name] [birth year] [id number]    Successful call placed to patient regarding patient assistance medication delivery of Novolin N, [place] with Novo Nordisk and Spiriva with [employer], HIPAA identifiers verified.   Spoke to patient's wife [name]. HIPAA confirmed and verified.  [name] confirmed that patient received the Spiriva inhaler in the mail. We discussed refill procedure which requires patient or his spouse to call [employer] at the number listed on the pharmacy label on the box to request the refill. [name] verified understanding.  [name] also confirmed that the patient received the Novolin N, Novolin R and Ozempic. Discussed refill procedure with patient which requires the provider's office to send in a refill request to the patient assistance company. Informed [name] that the providers office will get a fax approximately 80 days after the shipment date reminding them that the patient is due for a refill. Informed [name] to also send a reminder to the office when patient has about a month of medication left. [employer] will not accept refill requests from the patients, they must be initiated by the provider's office. [name] verbalized understanding. Confirmed [name] had name and number.  Follow up:  Will follow up with [employer] as previously scheduled as still working on them for the patient's Proventil HFA.  Kiylah Loyer P. Cesare Sumlin, [place]  [phone]

## 2019-07-25 NOTE — Progress Notes (Signed)
*  PRELIMINARY RESULTS* Echocardiogram Limited 2-D Echocardiogram has been performed to re-evaluate pericardial effusion. No need for Definity per B.Strader PA-C  Samuel Germany 07/25/2019, 10:08 AM

## 2019-07-25 NOTE — Telephone Encounter (Signed)
  Prescription Request  07/25/2019  What is the name of the medication or equipment? Pt needs portable oxygen tank called inogen handheld oxygen machine   Have you contacted your pharmacy to request a refill? (if applicable) yes  Which pharmacy would you like this sent to? [facility] in [place]   Patient notified that their request is being sent to the clinical staff for review and that they should receive a response within 2 business days.

## 2019-07-26 ENCOUNTER — Telehealth: Payer: Self-pay

## 2019-07-26 DIAGNOSIS — M1990 Unspecified osteoarthritis, unspecified site: Secondary | ICD-10-CM | POA: Diagnosis not present

## 2019-07-26 DIAGNOSIS — I872 Venous insufficiency (chronic) (peripheral): Secondary | ICD-10-CM | POA: Diagnosis not present

## 2019-07-26 DIAGNOSIS — J449 Chronic obstructive pulmonary disease, unspecified: Secondary | ICD-10-CM | POA: Diagnosis not present

## 2019-07-26 DIAGNOSIS — E11319 Type 2 diabetes mellitus with unspecified diabetic retinopathy without macular edema: Secondary | ICD-10-CM | POA: Diagnosis not present

## 2019-07-26 DIAGNOSIS — E1140 Type 2 diabetes mellitus with diabetic neuropathy, unspecified: Secondary | ICD-10-CM | POA: Diagnosis not present

## 2019-07-26 DIAGNOSIS — Z7901 Long term (current) use of anticoagulants: Secondary | ICD-10-CM | POA: Diagnosis not present

## 2019-07-26 DIAGNOSIS — E1165 Type 2 diabetes mellitus with hyperglycemia: Secondary | ICD-10-CM | POA: Diagnosis not present

## 2019-07-26 DIAGNOSIS — Z7951 Long term (current) use of inhaled steroids: Secondary | ICD-10-CM | POA: Diagnosis not present

## 2019-07-26 DIAGNOSIS — I110 Hypertensive heart disease with heart failure: Secondary | ICD-10-CM | POA: Diagnosis not present

## 2019-07-26 DIAGNOSIS — G4733 Obstructive sleep apnea (adult) (pediatric): Secondary | ICD-10-CM | POA: Diagnosis not present

## 2019-07-26 DIAGNOSIS — L03115 Cellulitis of right lower limb: Secondary | ICD-10-CM | POA: Diagnosis not present

## 2019-07-26 DIAGNOSIS — I482 Chronic atrial fibrillation, unspecified: Secondary | ICD-10-CM | POA: Diagnosis not present

## 2019-07-26 DIAGNOSIS — I5033 Acute on chronic diastolic (congestive) heart failure: Secondary | ICD-10-CM | POA: Diagnosis not present

## 2019-07-26 DIAGNOSIS — R911 Solitary pulmonary nodule: Secondary | ICD-10-CM | POA: Diagnosis not present

## 2019-07-26 DIAGNOSIS — D696 Thrombocytopenia, unspecified: Secondary | ICD-10-CM | POA: Diagnosis not present

## 2019-07-26 DIAGNOSIS — K219 Gastro-esophageal reflux disease without esophagitis: Secondary | ICD-10-CM | POA: Diagnosis not present

## 2019-07-26 DIAGNOSIS — L03116 Cellulitis of left lower limb: Secondary | ICD-10-CM | POA: Diagnosis not present

## 2019-07-26 DIAGNOSIS — J9621 Acute and chronic respiratory failure with hypoxia: Secondary | ICD-10-CM | POA: Diagnosis not present

## 2019-07-26 DIAGNOSIS — D472 Monoclonal gammopathy: Secondary | ICD-10-CM | POA: Diagnosis not present

## 2019-07-26 DIAGNOSIS — Z794 Long term (current) use of insulin: Secondary | ICD-10-CM | POA: Diagnosis not present

## 2019-07-26 DIAGNOSIS — E1151 Type 2 diabetes mellitus with diabetic peripheral angiopathy without gangrene: Secondary | ICD-10-CM | POA: Diagnosis not present

## 2019-07-26 DIAGNOSIS — E785 Hyperlipidemia, unspecified: Secondary | ICD-10-CM | POA: Diagnosis not present

## 2019-07-26 NOTE — Telephone Encounter (Signed)
-----   Message from Erma Heritage, [place] sent at 07/25/2019  4:27 PM EDT ----- Please let the patient know his repeat echocardiogram showed normal pumping function of the heart with no wall motion abnormalities. The pericardial effusion (fluid around his heart) has now resolved. Overall, a reassuring study. Keep scheduled follow-up with Dr. Percival Spanish.

## 2019-07-26 NOTE — Telephone Encounter (Signed)
Pt notified. Voiced understanding. Will forward to pcp.

## 2019-07-27 DIAGNOSIS — R404 Transient alteration of awareness: Secondary | ICD-10-CM | POA: Diagnosis not present

## 2019-07-27 DIAGNOSIS — E161 Other hypoglycemia: Secondary | ICD-10-CM | POA: Diagnosis not present

## 2019-07-27 DIAGNOSIS — E162 Hypoglycemia, unspecified: Secondary | ICD-10-CM | POA: Diagnosis not present

## 2019-07-28 ENCOUNTER — Inpatient Hospital Stay (HOSPITAL_COMMUNITY)
Admission: EM | Admit: 2019-07-28 | Discharge: 2019-07-30 | DRG: 189 | Disposition: A | Discharge status: Home Health | Payer: Medicare Other | Attending: Family Medicine | Admitting: Family Medicine

## 2019-07-28 ENCOUNTER — Encounter (HOSPITAL_COMMUNITY): Payer: Self-pay | Admitting: Emergency Medicine

## 2019-07-28 ENCOUNTER — Emergency Department (HOSPITAL_COMMUNITY): Payer: Medicare Other

## 2019-07-28 ENCOUNTER — Other Ambulatory Visit: Payer: Self-pay

## 2019-07-28 DIAGNOSIS — E1165 Type 2 diabetes mellitus with hyperglycemia: Secondary | ICD-10-CM | POA: Diagnosis not present

## 2019-07-28 DIAGNOSIS — E119 Type 2 diabetes mellitus without complications: Secondary | ICD-10-CM | POA: Diagnosis not present

## 2019-07-28 DIAGNOSIS — E785 Hyperlipidemia, unspecified: Secondary | ICD-10-CM | POA: Diagnosis not present

## 2019-07-28 DIAGNOSIS — Z800 Family history of malignant neoplasm of digestive organs: Secondary | ICD-10-CM

## 2019-07-28 DIAGNOSIS — J9621 Acute and chronic respiratory failure with hypoxia: Secondary | ICD-10-CM | POA: Diagnosis not present

## 2019-07-28 DIAGNOSIS — J9602 Acute respiratory failure with hypercapnia: Secondary | ICD-10-CM | POA: Diagnosis present

## 2019-07-28 DIAGNOSIS — Z794 Long term (current) use of insulin: Secondary | ICD-10-CM | POA: Diagnosis not present

## 2019-07-28 DIAGNOSIS — F411 Generalized anxiety disorder: Secondary | ICD-10-CM | POA: Diagnosis not present

## 2019-07-28 DIAGNOSIS — Z7901 Long term (current) use of anticoagulants: Secondary | ICD-10-CM

## 2019-07-28 DIAGNOSIS — Z6841 Body Mass Index (BMI) 40.0 and over, adult: Secondary | ICD-10-CM

## 2019-07-28 DIAGNOSIS — I309 Acute pericarditis, unspecified: Secondary | ICD-10-CM | POA: Diagnosis not present

## 2019-07-28 DIAGNOSIS — C8590 Non-Hodgkin lymphoma, unspecified, unspecified site: Secondary | ICD-10-CM | POA: Diagnosis not present

## 2019-07-28 DIAGNOSIS — R0600 Dyspnea, unspecified: Secondary | ICD-10-CM | POA: Diagnosis not present

## 2019-07-28 DIAGNOSIS — I10 Essential (primary) hypertension: Secondary | ICD-10-CM | POA: Diagnosis not present

## 2019-07-28 DIAGNOSIS — Z79899 Other long term (current) drug therapy: Secondary | ICD-10-CM

## 2019-07-28 DIAGNOSIS — I482 Chronic atrial fibrillation, unspecified: Secondary | ICD-10-CM | POA: Diagnosis present

## 2019-07-28 DIAGNOSIS — I5032 Chronic diastolic (congestive) heart failure: Secondary | ICD-10-CM | POA: Diagnosis present

## 2019-07-28 DIAGNOSIS — J9811 Atelectasis: Secondary | ICD-10-CM | POA: Diagnosis not present

## 2019-07-28 DIAGNOSIS — Z8249 Family history of ischemic heart disease and other diseases of the circulatory system: Secondary | ICD-10-CM

## 2019-07-28 DIAGNOSIS — G4733 Obstructive sleep apnea (adult) (pediatric): Secondary | ICD-10-CM | POA: Diagnosis present

## 2019-07-28 DIAGNOSIS — J9601 Acute respiratory failure with hypoxia: Secondary | ICD-10-CM | POA: Diagnosis not present

## 2019-07-28 DIAGNOSIS — I959 Hypotension, unspecified: Secondary | ICD-10-CM | POA: Diagnosis not present

## 2019-07-28 DIAGNOSIS — Z20822 Contact with and (suspected) exposure to covid-19: Secondary | ICD-10-CM | POA: Diagnosis not present

## 2019-07-28 DIAGNOSIS — J449 Chronic obstructive pulmonary disease, unspecified: Secondary | ICD-10-CM | POA: Diagnosis not present

## 2019-07-28 DIAGNOSIS — Z833 Family history of diabetes mellitus: Secondary | ICD-10-CM

## 2019-07-28 DIAGNOSIS — Z87891 Personal history of nicotine dependence: Secondary | ICD-10-CM | POA: Diagnosis not present

## 2019-07-28 DIAGNOSIS — Z7951 Long term (current) use of inhaled steroids: Secondary | ICD-10-CM | POA: Diagnosis not present

## 2019-07-28 DIAGNOSIS — J9622 Acute and chronic respiratory failure with hypercapnia: Secondary | ICD-10-CM | POA: Diagnosis not present

## 2019-07-28 DIAGNOSIS — E11649 Type 2 diabetes mellitus with hypoglycemia without coma: Secondary | ICD-10-CM | POA: Diagnosis present

## 2019-07-28 DIAGNOSIS — R0602 Shortness of breath: Secondary | ICD-10-CM | POA: Diagnosis not present

## 2019-07-28 DIAGNOSIS — F329 Major depressive disorder, single episode, unspecified: Secondary | ICD-10-CM | POA: Diagnosis present

## 2019-07-28 DIAGNOSIS — I110 Hypertensive heart disease with heart failure: Secondary | ICD-10-CM | POA: Diagnosis not present

## 2019-07-28 DIAGNOSIS — Z825 Family history of asthma and other chronic lower respiratory diseases: Secondary | ICD-10-CM

## 2019-07-28 DIAGNOSIS — D472 Monoclonal gammopathy: Secondary | ICD-10-CM | POA: Diagnosis not present

## 2019-07-28 DIAGNOSIS — Z8042 Family history of malignant neoplasm of prostate: Secondary | ICD-10-CM

## 2019-07-28 DIAGNOSIS — J9620 Acute and chronic respiratory failure, unspecified whether with hypoxia or hypercapnia: Secondary | ICD-10-CM | POA: Diagnosis not present

## 2019-07-28 LAB — COMPREHENSIVE METABOLIC PANEL
ALT: 17 U/L (ref 0–44)
AST: 28 U/L (ref 15–41)
Albumin: 3.8 g/dL (ref 3.5–5.0)
Alkaline Phosphatase: 90 U/L (ref 38–126)
Anion gap: 12 (ref 5–15)
BUN: 33 mg/dL — ABNORMAL HIGH (ref 8–23)
CO2: 34 mmol/L — ABNORMAL HIGH (ref 22–32)
Calcium: 9.8 mg/dL (ref 8.9–10.3)
Chloride: 94 mmol/L — ABNORMAL LOW (ref 98–111)
Creatinine, Ser: 1.34 mg/dL — ABNORMAL HIGH (ref 0.61–1.24)
GFR calc Af Amer: >60 mL/min (ref >60)
GFR calc non Af Amer: 54 mL/min — ABNORMAL LOW (ref >60)
Glucose, Bld: 47 mg/dL — ABNORMAL LOW (ref 70–99)
Potassium: 3.5 mmol/L (ref 3.5–5.1)
Sodium: 140 mmol/L (ref 135–145)
Total Bilirubin: 0.7 mg/dL (ref 0.3–1.2)
Total Protein: 8.2 g/dL — ABNORMAL HIGH (ref 6.5–8.1)

## 2019-07-28 LAB — BRAIN NATRIURETIC PEPTIDE: B Natriuretic Peptide: 243 pg/mL — ABNORMAL HIGH (ref 0.0–100.0)

## 2019-07-28 LAB — URINALYSIS, ROUTINE W REFLEX MICROSCOPIC
Bacteria, UA: NONE SEEN
Bilirubin Urine: NEGATIVE
Glucose, UA: 50 mg/dL — AB
Hgb urine dipstick: NEGATIVE
Ketones, ur: NEGATIVE mg/dL
Nitrite: NEGATIVE
Protein, ur: NEGATIVE mg/dL
Specific Gravity, Urine: 1.01 (ref 1.005–1.030)
pH: 6 (ref 5.0–8.0)

## 2019-07-28 LAB — APTT: aPTT: 31 seconds (ref 24–36)

## 2019-07-28 LAB — BLOOD GAS, VENOUS
Acid-Base Excess: 11.9 mmol/L — ABNORMAL HIGH (ref 0.0–2.0)
Bicarbonate: 32 mmol/L — ABNORMAL HIGH (ref 20.0–28.0)
FIO2: 32
O2 Saturation: 31 %
Patient temperature: 37
pCO2, Ven: 68.8 mmHg — ABNORMAL HIGH (ref 44.0–60.0)
pH, Ven: 7.358 (ref 7.250–7.430)
pO2, Ven: <31 mmHg — CL (ref 32.0–45.0)

## 2019-07-28 LAB — CBC WITH DIFFERENTIAL/PLATELET
Abs Immature Granulocytes: 0.02 10*3/uL (ref 0.00–0.07)
Basophils Absolute: 0 10*3/uL (ref 0.0–0.1)
Basophils Relative: 1 %
Eosinophils Absolute: 0.1 10*3/uL (ref 0.0–0.5)
Eosinophils Relative: 1 %
HCT: 42.5 % (ref 39.0–52.0)
Hemoglobin: 13.3 g/dL (ref 13.0–17.0)
Immature Granulocytes: 0 %
Lymphocytes Relative: 13 %
Lymphs Abs: 1.1 10*3/uL (ref 0.7–4.0)
MCH: 29.2 pg (ref 26.0–34.0)
MCHC: 31.3 g/dL (ref 30.0–36.0)
MCV: 93.4 fL (ref 80.0–100.0)
Monocytes Absolute: 0.5 10*3/uL (ref 0.1–1.0)
Monocytes Relative: 6 %
Neutro Abs: 6.6 10*3/uL (ref 1.7–7.7)
Neutrophils Relative %: 79 %
Platelets: 288 10*3/uL (ref 150–400)
RBC: 4.55 MIL/uL (ref 4.22–5.81)
RDW: 13.5 % (ref 11.5–15.5)
WBC: 8.3 10*3/uL (ref 4.0–10.5)
nRBC: 0 % (ref 0.0–0.2)

## 2019-07-28 LAB — GLUCOSE, CAPILLARY
Glucose-Capillary: 88 mg/dL (ref 70–99)
Glucose-Capillary: 195 mg/dL — ABNORMAL HIGH (ref 70–99)

## 2019-07-28 LAB — PROTIME-INR
INR: 1.4 — ABNORMAL HIGH (ref 0.8–1.2)
Prothrombin Time: 16.9 seconds — ABNORMAL HIGH (ref 11.4–15.2)

## 2019-07-28 LAB — CBG MONITORING, ED
Glucose-Capillary: 49 mg/dL — ABNORMAL LOW (ref 70–99)
Glucose-Capillary: 84 mg/dL (ref 70–99)

## 2019-07-28 LAB — RESPIRATORY PANEL BY RT PCR (FLU A&B, COVID)
Influenza A by PCR: NEGATIVE
Influenza B by PCR: NEGATIVE
SARS Coronavirus 2 by RT PCR: NEGATIVE

## 2019-07-28 LAB — MAGNESIUM: Magnesium: 1.9 mg/dL (ref 1.7–2.4)

## 2019-07-28 LAB — LACTIC ACID, PLASMA: Lactic Acid, Venous: 0.9 mmol/L (ref 0.5–1.9)

## 2019-07-28 MED ORDER — DEXTROSE 50 % IV SOLN
1.0000 | Freq: Once | INTRAVENOUS | Status: AC
  Administered 2019-07-28: 50 mL via INTRAVENOUS
  Filled 2019-07-28: qty 50

## 2019-07-28 MED ORDER — SODIUM CHLORIDE 0.9 % IV BOLUS (SEPSIS)
1000.0000 mL | Freq: Once | INTRAVENOUS | Status: AC
  Administered 2019-07-28: 1000 mL via INTRAVENOUS

## 2019-07-28 MED ORDER — ALBUTEROL SULFATE HFA 108 (90 BASE) MCG/ACT IN AERS
4.0000 | INHALATION_SPRAY | Freq: Once | RESPIRATORY_TRACT | Status: AC
  Administered 2019-07-28: 4 via RESPIRATORY_TRACT
  Filled 2019-07-28: qty 6.7

## 2019-07-28 MED ORDER — SODIUM CHLORIDE 0.9 % IV SOLN
2.0000 g | Freq: Three times a day (TID) | INTRAVENOUS | Status: DC
  Administered 2019-07-28: 2 g via INTRAVENOUS
  Filled 2019-07-28: qty 2

## 2019-07-28 MED ORDER — AEROCHAMBER PLUS FLO-VU MISC
1.0000 | Freq: Once | Status: AC
  Administered 2019-07-28: 1
  Filled 2019-07-28: qty 1

## 2019-07-28 MED ORDER — SODIUM CHLORIDE 0.9 % IV BOLUS (SEPSIS)
200.0000 mL | Freq: Once | INTRAVENOUS | Status: AC
  Administered 2019-07-28: 200 mL via INTRAVENOUS

## 2019-07-28 MED ORDER — VANCOMYCIN HCL 2000 MG/400ML IV SOLN
2000.0000 mg | Freq: Once | INTRAVENOUS | Status: AC
  Administered 2019-07-28: 2000 mg via INTRAVENOUS
  Filled 2019-07-28: qty 400

## 2019-07-28 MED ORDER — APIXABAN 5 MG PO TABS
5.0000 mg | ORAL_TABLET | Freq: Two times a day (BID) | ORAL | Status: DC
  Administered 2019-07-28 – 2019-07-30 (×4): 5 mg via ORAL
  Filled 2019-07-28 (×5): qty 1

## 2019-07-28 MED ORDER — ACETAMINOPHEN 650 MG RE SUPP: 650.0000 mg | Freq: Four times a day (QID) | RECTAL | Status: DC | PRN

## 2019-07-28 MED ORDER — POLYETHYLENE GLYCOL 3350 17 G PO PACK: 17.0000 g | PACK | Freq: Every day | ORAL | Status: DC | PRN

## 2019-07-28 MED ORDER — FLUTICASONE-UMECLIDIN-VILANT 100-62.5-25 MCG/INH IN AEPB: 1.0000 | INHALATION_SPRAY | Freq: Every day | RESPIRATORY_TRACT | Status: DC

## 2019-07-28 MED ORDER — UMECLIDINIUM BROMIDE 62.5 MCG/INH IN AEPB
1.0000 | INHALATION_SPRAY | Freq: Every day | RESPIRATORY_TRACT | Status: DC
  Administered 2019-07-29: 1 via RESPIRATORY_TRACT
  Filled 2019-07-28: qty 7

## 2019-07-28 MED ORDER — ONDANSETRON HCL 4 MG/2ML IJ SOLN: 4.0000 mg | Freq: Four times a day (QID) | INTRAMUSCULAR | Status: DC | PRN

## 2019-07-28 MED ORDER — ACETAMINOPHEN 325 MG PO TABS: 650.0000 mg | ORAL_TABLET | Freq: Four times a day (QID) | ORAL | Status: DC | PRN

## 2019-07-28 MED ORDER — SODIUM CHLORIDE 0.9 % IV SOLN
2.0000 g | INTRAVENOUS | Status: DC
  Administered 2019-07-28: 2 g via INTRAVENOUS
  Filled 2019-07-28: qty 20

## 2019-07-28 MED ORDER — PAROXETINE HCL 20 MG PO TABS
20.0000 mg | ORAL_TABLET | Freq: Every day | ORAL | Status: DC
  Administered 2019-07-29 – 2019-07-30 (×2): 20 mg via ORAL
  Filled 2019-07-28 (×3): qty 1

## 2019-07-28 MED ORDER — FLUTICASONE FUROATE-VILANTEROL 100-25 MCG/INH IN AEPB
1.0000 | INHALATION_SPRAY | Freq: Every day | RESPIRATORY_TRACT | Status: DC
  Administered 2019-07-29: 1 via RESPIRATORY_TRACT
  Filled 2019-07-28: qty 28

## 2019-07-28 MED ORDER — INSULIN ASPART 100 UNIT/ML ~~LOC~~ SOLN
0.0000 [IU] | Freq: Three times a day (TID) | SUBCUTANEOUS | Status: DC
  Administered 2019-07-29: 1 [IU] via SUBCUTANEOUS
  Administered 2019-07-29: 3 [IU] via SUBCUTANEOUS
  Administered 2019-07-29: 5 [IU] via SUBCUTANEOUS
  Administered 2019-07-30: 2 [IU] via SUBCUTANEOUS
  Administered 2019-07-30: 5 [IU] via SUBCUTANEOUS

## 2019-07-28 MED ORDER — ONDANSETRON HCL 4 MG PO TABS: 4.0000 mg | ORAL_TABLET | Freq: Four times a day (QID) | ORAL | Status: DC | PRN

## 2019-07-28 MED ORDER — POTASSIUM CHLORIDE CRYS ER 20 MEQ PO TBCR
40.0000 meq | EXTENDED_RELEASE_TABLET | Freq: Once | ORAL | Status: AC
  Administered 2019-07-28: 22:00:00 40 meq via ORAL
  Filled 2019-07-28: qty 2

## 2019-07-28 MED ORDER — VANCOMYCIN HCL IN DEXTROSE 1-5 GM/200ML-% IV SOLN: 1000.0000 mg | Freq: Two times a day (BID) | INTRAVENOUS | Status: DC

## 2019-07-28 MED ORDER — IPRATROPIUM BROMIDE HFA 17 MCG/ACT IN AERS: 2.0000 | INHALATION_SPRAY | Freq: Once | RESPIRATORY_TRACT | Status: DC

## 2019-07-28 MED ORDER — METRONIDAZOLE IN NACL 5-0.79 MG/ML-% IV SOLN
500.0000 mg | Freq: Once | INTRAVENOUS | Status: AC
  Administered 2019-07-28: 500 mg via INTRAVENOUS
  Filled 2019-07-28: qty 100

## 2019-07-28 MED ORDER — INSULIN ASPART 100 UNIT/ML ~~LOC~~ SOLN
0.0000 [IU] | Freq: Every day | SUBCUTANEOUS | Status: DC
  Administered 2019-07-29: 2 [IU] via SUBCUTANEOUS

## 2019-07-28 MED ORDER — IPRATROPIUM-ALBUTEROL 0.5-2.5 (3) MG/3ML IN SOLN
3.0000 mL | Freq: Four times a day (QID) | RESPIRATORY_TRACT | Status: DC | PRN
  Administered 2019-07-29 (×2): 3 mL via RESPIRATORY_TRACT
  Filled 2019-07-28 (×2): qty 3

## 2019-07-28 MED ORDER — METOPROLOL TARTRATE 50 MG PO TABS
100.0000 mg | ORAL_TABLET | Freq: Two times a day (BID) | ORAL | Status: DC
  Administered 2019-07-28: 100 mg via ORAL
  Filled 2019-07-28 (×3): qty 2

## 2019-07-28 NOTE — ED Notes (Signed)
Call [place] with updates at [phone]

## 2019-07-28 NOTE — ED Notes (Signed)
Call to [facility] for pt update; vm left

## 2019-07-28 NOTE — Progress Notes (Signed)
Pharmacy Antibiotic Note  Justin Ruiz is a 69 y.o. male admitted on 07/28/2019 with unknown source.  Pharmacy has been consulted for Vancomycin and cefepime dosing.  Plan: Vancomycin 2000mg  loading dose, then 1000mg   IV every 12 hours.  Goal trough 15-20 mcg/mL.  Cefepime 2gm IV q8h F/U cxs and clinical progress Monitor V/S, labs and levels as indicated  Height: 5\' 9"  (175.3 cm) Weight: 135 kg (297 lb 9.9 oz) IBW/kg (Calculated) : 70.7  Temp (24hrs), Avg:98.1 F (36.7 C), Min:98.1 F (36.7 C), Max:98.1 F (36.7 C)  Recent Labs  Lab 07/28/19 1227  WBC 8.3    Estimated Creatinine Clearance: 81.7 mL/min (by C-G formula based on SCr of 1.18 mg/dL).  Normalized CrCl 95mls/min   Allergies  Allergen Reactions  . Avapro [Irbesartan] Other (See Comments)    "doesnt sit right with me"--light headed  . Lipitor [Atorvastatin Calcium] Other (See Comments)    Leg pain    Antimicrobials this admission: Vancomycin 4/4>>  Cefepime 4/4>>   Dose adjustments this admission: prn  Microbiology results: 4/4 BCx: pending 4/4 UCx: pending  MRSA PCR:   Thank you for allowing pharmacy to be a part of this patient's care.  Isac Sarna, BS Pharm D, BCPS Clinical Pharmacist 07/28/2019 12:40 PM

## 2019-07-28 NOTE — ED Notes (Signed)
ED TO INPATIENT HANDOFF REPORT  ED Nurse Name and Phone #:  [phone]   S Name/Age/Gender Marnee Guarneri 69 y.o. male Room/Bed: APA05/APA05  Code Status   Code Status: Prior  Home/SNF/Other Home Patient oriented to: self, place, time and situation Is this baseline? Yes   Triage Complete: Triage complete  Chief Complaint Dyspnea [R06.00]  Triage Note Increasing shortness of breath x 3 days. Wears 3l o2 continuously     Allergies Allergies  Allergen Reactions  . Avapro [Irbesartan] Other (See Comments)    "doesnt sit right with me"--light headed  . Lipitor [Atorvastatin Calcium] Other (See Comments)    Leg pain    Level of Care/Admitting Diagnosis ED Disposition    ED Disposition Condition [place]: [facility] [id number]  Level of Care: Telemetry [5]  Covid Evaluation: Asymptomatic Screening Protocol (No Symptoms)  Diagnosis: Dyspnea [id number]  Admitting Physician: Bethena Roys [id number]  Attending Physician: Bethena Roys [email or web address]       B Medical/Surgery History Past Medical History:  Diagnosis Date  . Acute diastolic heart failure ([place]) 03/28/2016  . Acute encephalopathy 03/28/2016  . Acute encephalopathy 03/28/2016  . Acute on chronic respiratory failure with hypoxia ([place]) 12/04/2016  . Arthritis   . Asthma   . Atrial fibrillation ([place])    on AC  . Benign neoplasm of ascending colon 05/05/2014  . BPH (benign prostatic hypertrophy)   . CARDIOVASCULAR STUDIES, ABNORMAL 04/27/2009   Qualifier: Diagnosis of  By: Percival Spanish, MD, Farrel Gordon    . Chronic anticoagulation 08/20/2012  . Chronic respiratory failure with hypoxia ([place]) 11/10/2017  . Colon polyps   . COPD mixed type ([place]) 03/28/2016   PFT 06/17/14- severe obstructive airways disease with slight response to bronchodilator. FVC 2.03/42%, FEV1 1.02/28%, ratio 0.5, TLC 82%, DLCO 59%  . Diabetes mellitus   . Diabetic neuropathy, type II diabetes mellitus ([place])  09/25/2014  . Diabetic retinopathy ([place]) 05/28/2017  . 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 GERD (gastroesophageal reflux disease)   . HTN (hypertension)    x 3 years  . Hyperlipidemia   . Lung nodules 03/11/2013  . Mediastinal adenopathy 06/20/2013   CT  & PET 2/15   . Memory loss   . 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 NHL (non-Hodgkin's lymphoma) ([place])    nhl dx 9/11  . Obesity    exogenous  . Obstructive sleep apnea 05/25/2014   NPSG-  04/07/2014-severe obstructive sleep apnea-AHI 75.1 per hour. CPAP titrated to 16. He required supplemental oxygen at 2 L/ [employer] which he already wears for sleep at home. Weight 338 pounds   . Peripheral vascular insufficiency ([place]) 08/02/2017  . Personal history of colonic polyps 03/02/2009   Qualifier: Diagnosis of  By: Deatra Ina MD, Sandy Salaam   . Sepsis ([place]) 06/07/2015  . Severe obesity (BMI >= 40) ([place]) 05/01/2013  . Shingles   . Sleep apnea    CPAP machine is broken  . Thrombocytopenia ([place]) 11/23/2016   Past Surgical History:  Procedure Laterality Date  . APPENDECTOMY    . COLONOSCOPY WITH PROPOFOL N/A 05/05/2014   Procedure: COLONOSCOPY WITH PROPOFOL;  Surgeon: Inda Castle, MD;  Location: [facility];  Service: Endoscopy;  Laterality: N/A;     A IV Location/Drains/Wounds Patient Lines/Drains/Airways Status   Active Line/Drains/Airways    Name:   Placement date:   Placement time:   Site:   Days:  Peripheral IV 07/28/19 Right Antecubital   07/28/19    1241    Antecubital   less than 1   Peripheral IV 07/28/19 Right Wrist   07/28/19    1239    Wrist   less than 1   Peripheral IV 07/28/19 Right Antecubital   07/28/19    1254    Antecubital   less than 1          Intake/Output Last 24 hours  Intake/Output Summary (Last 24 hours) at 07/28/2019 1736 Last data filed at 07/28/2019 1648 Gross per 24 hour  Intake 2800 ml  Output --  Net 2800 ml    Labs/Imaging Results for  orders placed or performed during the hospital encounter of 07/28/19 (from the past 48 hour(s))  CBG monitoring, ED     Status: Abnormal   Collection Time: 07/28/19 12:18 PM  Result Value Ref Range   Glucose-Capillary 49 (L) 70 - 99 mg/dL    Comment: Glucose reference range applies only to samples taken after fasting for at least 8 hours.  Comprehensive metabolic panel     Status: Abnormal   Collection Time: 07/28/19 12:27 PM  Result Value Ref Range   Sodium 140 135 - 145 mmol/L   Potassium 3.5 3.5 - 5.1 mmol/L   Chloride 94 (L) 98 - 111 mmol/L   CO2 34 (H) 22 - 32 mmol/L   Glucose, Bld 47 (L) 70 - 99 mg/dL    Comment: Glucose reference range applies only to samples taken after fasting for at least 8 hours.   BUN 33 (H) 8 - 23 mg/dL   Creatinine, Ser 1.34 (H) 0.61 - 1.24 mg/dL   Calcium 9.8 8.9 - 10.3 mg/dL   Total Protein 8.2 (H) 6.5 - 8.1 g/dL   Albumin 3.8 3.5 - 5.0 g/dL   AST 28 15 - 41 U/L   ALT 17 0 - 44 U/L   Alkaline Phosphatase 90 38 - 126 U/L   Total Bilirubin 0.7 0.3 - 1.2 mg/dL   GFR calc non Af Amer 54 (L) >60 mL/min   GFR calc Af Amer >60 >60 mL/min   Anion gap 12 5 - 15    Comment: Performed at [facility], [address] [place], [place] [address]  CBC WITH DIFFERENTIAL     Status: None   Collection Time: 07/28/19 12:27 PM  Result Value Ref Range   WBC 8.3 4.0 - 10.5 K/uL   RBC 4.55 4.22 - 5.81 MIL/uL   Hemoglobin 13.3 13.0 - 17.0 g/dL   HCT 42.5 39.0 - 52.0 %   MCV 93.4 80.0 - 100.0 fL   MCH 29.2 26.0 - 34.0 pg   MCHC 31.3 30.0 - 36.0 g/dL   RDW 13.5 11.5 - 15.5 %   Platelets 288 150 - 400 K/uL   nRBC 0.0 0.0 - 0.2 %   Neutrophils Relative % 79 %   Neutro Abs 6.6 1.7 - 7.7 K/uL   Lymphocytes Relative 13 %   Lymphs Abs 1.1 0.7 - 4.0 K/uL   Monocytes Relative 6 %   Monocytes Absolute 0.5 0.1 - 1.0 K/uL   Eosinophils Relative 1 %   Eosinophils Absolute 0.1 0.0 - 0.5 K/uL   Basophils Relative 1 %   Basophils Absolute 0.0 0.0 - 0.1 K/uL   Immature  Granulocytes 0 %   Abs Immature Granulocytes 0.02 0.00 - 0.07 K/uL    Comment: Performed at [facility], [address] [place], [place] [address]  APTT     Status: None   Collection Time: 07/28/19 12:27 PM  Result Value Ref Range   aPTT 31 24 - 36 seconds    Comment: Performed at [facility], [address], [place], [place] [address]  Protime-INR     Status: Abnormal   Collection Time: 07/28/19 12:27 PM  Result Value Ref Range   Prothrombin Time 16.9 (H) 11.4 - 15.2 seconds   INR 1.4 (H) 0.8 - 1.2    Comment: (NOTE) INR goal varies based on device and disease states. Performed at [facility], [address] [place], [place] [address]   Urinalysis, Routine w reflex microscopic     Status: Abnormal   Collection Time: 07/28/19 12:27 PM  Result Value Ref Range   Color, Urine STRAW (A) YELLOW   APPearance CLEAR CLEAR   Specific Gravity, Urine 1.010 1.005 - 1.030   pH 6.0 5.0 - 8.0   Glucose, UA 50 (A) NEGATIVE mg/dL   Hgb urine dipstick NEGATIVE NEGATIVE   Bilirubin Urine NEGATIVE NEGATIVE   Ketones, ur NEGATIVE NEGATIVE mg/dL   Protein, ur NEGATIVE NEGATIVE mg/dL   Nitrite NEGATIVE NEGATIVE   Leukocytes,Ua TRACE (A) NEGATIVE   RBC / HPF 0-5 0 - 5 RBC/hpf   WBC, UA 0-5 0 - 5 WBC/hpf   Bacteria, UA NONE SEEN NONE SEEN   Hyaline Casts, UA PRESENT     Comment: Performed at [facility], [address] [place], [place] [address]  Brain natriuretic peptide     Status: Abnormal   Collection Time: 07/28/19 12:27 PM  Result Value Ref Range   B Natriuretic Peptide 243.0 (H) 0.0 - 100.0 pg/mL    Comment: Performed at [facility], [address], [place], [place] [address]  Lactic acid, plasma     Status: None   Collection Time: 07/28/19 12:41 PM  Result Value Ref Range   Lactic Acid, Venous 0.9 0.5 - 1.9 mmol/L    Comment: Performed at [facility], [address], [place], [place] [address]  Blood Culture (routine x 2)     Status: None (Preliminary result)   Collection Time:  07/28/19 12:41 PM   Specimen: Right Antecubital; Blood  Result Value Ref Range   Specimen Description      RIGHT ANTECUBITAL BOTTLES DRAWN AEROBIC AND ANAEROBIC   Special Requests      Blood Culture adequate volume Performed at [facility], [address], [place], [place] [address]    Culture PENDING    Report Status PENDING   Respiratory Panel by RT PCR (Flu A&B, Covid) - Nasopharyngeal Swab     Status: None   Collection Time: 07/28/19 12:41 PM   Specimen: Nasopharyngeal Swab  Result Value Ref Range   SARS Coronavirus 2 by RT PCR NEGATIVE NEGATIVE    Comment: (NOTE) SARS-CoV-2 target nucleic acids are NOT DETECTED. The SARS-CoV-2 RNA is generally detectable in upper respiratoy specimens during the acute phase of infection. The lowest concentration of SARS-CoV-2 viral copies this assay can detect is 131 copies/mL. A negative result does not preclude SARS-Cov-2 infection and should not be used as the sole basis for treatment or other patient management decisions. A negative result may occur with  improper specimen collection/handling, submission of specimen other than nasopharyngeal swab, presence of viral mutation(s) within the areas targeted by this assay, and inadequate number of viral copies (<131 copies/mL). A negative result must be combined with clinical observations, patient history, and epidemiological information. The expected result is Negative. Fact  Sheet for Patients:  PinkCheek.be Fact Sheet for Healthcare Providers:  GravelBags.it This test is not yet ap proved or cleared by the [place] FDA and  has been authorized for detection and/or diagnosis of SARS-CoV-2 by FDA under an Emergency Use Authorization (EUA). This EUA will remain  in effect (meaning this test can be used) for the duration of the COVID-19 declaration under Section 564(b)(1) of the Act, 21 U.S.C. section 360bbb-3(b)(1), unless the  authorization is terminated or revoked sooner.    Influenza A by PCR NEGATIVE NEGATIVE   Influenza B by PCR NEGATIVE NEGATIVE    Comment: (NOTE) The Xpert Xpress SARS-CoV-2/FLU/RSV assay is intended as an aid in  the diagnosis of influenza from Nasopharyngeal swab specimens and  should not be used as a sole basis for treatment. Nasal washings and  aspirates are unacceptable for Xpert Xpress SARS-CoV-2/FLU/RSV  testing. Fact Sheet for Patients: PinkCheek.be Fact Sheet for Healthcare Providers: GravelBags.it This test is not yet approved or cleared by the [place] FDA and  has been authorized for detection and/or diagnosis of SARS-CoV-2 by  FDA under an Emergency Use Authorization (EUA). This EUA will remain  in effect (meaning this test can be used) for the duration of the  Covid-19 declaration under Section 564(b)(1) of the Act, 21  U.S.C. section 360bbb-3(b)(1), unless the authorization is  terminated or revoked. Performed at [facility], [address], [place], [place] [address]   Blood Culture (routine x 2)     Status: None (Preliminary result)   Collection Time: 07/28/19 12:42 PM   Specimen: Left Antecubital; Blood  Result Value Ref Range   Specimen Description      LEFT ANTECUBITAL BOTTLES DRAWN AEROBIC AND ANAEROBIC   Special Requests      Blood Culture adequate volume Performed at [facility], [address] [place], [place] [address]    Culture PENDING    Report Status PENDING   Blood gas, venous ([facility], [facility], [facility])     Status: Abnormal   Collection Time: 07/28/19 12:42 PM  Result Value Ref Range   FIO2 32.00    pH, Ven 7.358 7.250 - 7.430   pCO2, Ven 68.8 (H) 44.0 - 60.0 mmHg   pO2, Ven <31.0 (LL) 32.0 - 45.0 mmHg    Comment: CRITICAL RESULT CALLED TO, READ BACK BY AND VERIFIED WITH: TRAVIS DEVOLT,RN  @1255   07/28/2019 KAY    Bicarbonate 32.0 (H) 20.0 - 28.0 mmol/L   Acid-Base Excess 11.9 (H) 0.0 - 2.0  mmol/L   O2 Saturation 31.0 %   Patient temperature 37.0     Comment: Performed at [facility], [address] [place],  [address]  POC CBG, ED     Status: None   Collection Time: 07/28/19  1:44 PM  Result Value Ref Range   Glucose-Capillary 84 70 - 99 mg/dL    Comment: Glucose reference range applies only to samples taken after fasting for at least 8 hours.   DG Chest Port 1 View  Result Date: 07/28/2019 CLINICAL DATA:  Shortness of breath EXAM: PORTABLE CHEST 1 VIEW COMPARISON:  July 08, 2019 FINDINGS: There is a small right pleural effusion. There is mild right base atelectasis. The lungs elsewhere are clear. Heart is mildly enlarged with pulmonary vascularity normal. There is aortic atherosclerosis. No bone lesions. IMPRESSION: Home right base atelectasis with small right pleural effusion. No airspace consolidation. Stable cardiac prominence. Electronically Signed   By: Lowella Grip III M.D.   On: 07/28/2019 12:56  Pending Labs Unresulted Labs (From admission, onward)    Start     Ordered   07/28/19 1227  Urine culture  ONCE - STAT,   STAT     07/28/19 1231          Vitals/Pain Today's Vitals   07/28/19 1400 07/28/19 1430 07/28/19 1515 07/28/19 1530  BP: (!) 101/50 95/60 100/74 111/67  Pulse: (!) 52 (!) 51    Resp: (!) 22     Temp:      TempSrc:      SpO2: 100% 100%    Weight:      Height:      PainSc:        Isolation Precautions No active isolations  Medications Medications  ceFEPIme (MAXIPIME) 2 g in sodium chloride 0.9 % 100 mL IVPB (0 g Intravenous Stopped 07/28/19 1330)  vancomycin (VANCOREADY) IVPB 2000 mg/400 mL (0 mg Intravenous Stopped 07/28/19 1648)    Followed by  vancomycin (VANCOCIN) IVPB 1000 mg/200 mL premix (has no administration in time range)  sodium chloride 0.9 % bolus 1,000 mL (0 mLs Intravenous Stopped 07/28/19 1350)    And  sodium chloride 0.9 % bolus 1,000 mL (0 mLs Intravenous Stopped 07/28/19 1648)    And  sodium chloride 0.9 %  bolus 200 mL (0 mLs Intravenous Stopped 07/28/19 1340)  metroNIDAZOLE (FLAGYL) IVPB 500 mg (0 mg Intravenous Stopped 07/28/19 1401)  dextrose 50 % solution 50 mL (50 mLs Intravenous Given 07/28/19 1251)  albuterol (VENTOLIN HFA) 108 (90 Base) MCG/ACT inhaler 4-6 puff (4 puffs Inhalation Given 07/28/19 1304)  aerochamber plus with mask device 1 each (1 each Other Given 07/28/19 1305)    Mobility walks with device Moderate fall risk   Focused Assessments    R Recommendations: See Admitting Provider Note  Report given to:   Additional Notes:

## 2019-07-28 NOTE — H&P (Addendum)
History and Physical    KALYN AMIRIAN [id number] DOB: 1950-11-28 DOA: 07/28/2019  PCP: [name], MD   Patient coming from: Home  I have personally briefly reviewed patient's old medical records in [place]  Chief Complaint: Difficulty breathing  HPI: SOMTOCHUKWU HIRACHETA is a 69 y.o. male with medical history significant for diastolic CHF, chronic respiratory failure, MGUS, non-Hodgkin's lymphoma atrial fibrillation with RVR, morbid obesity, diabetes mellitus. Patient presented to the ED with complaints of difficulty with breathing, progressive over the past month.  Reports at rest he is okay.  But with short ambulation he gets short of breath.  He reports a dry cough over the past 2 days.  No fevers, no chills.  No chest pain.  He denies swelling of his lower extremities. He reports compliance with his Eliquis and Lasix every day.  He denies bloating or weight gain.  At baseline patient is on 3 L of oxygen, but he reports he has had to gradually increase it to 4-1/2 L over the past month.  Yesterday patient was feeling weak, blood sugar was 24, EMS was called, but patient declined transfer to hospital.  This morning his blood sugar was checked and it was 77.  He still received his insulins, but at reduced dose.  One of them was 12 units, ?  NPH and NovoLog.   He denies recent adjustments of his insulin doses.  Reports good oral intake.  Most recent hospitalization-3/15-3/17- also presented with difficulty breathing, CTA chest negative for PE showed small pleural effusions and a large pericardial effusion.  Subsequent echocardiogram 3/16 -moderate-sized pericardial effusion without tamponade.  Recommendation was for repeat echocardiogram in 2 to 3 weeks.  He was discharged home on 3 L. Follow-up limited echocardiogram 4/1 -resolved pericardial effusion.  Hospitalization-3/2-3/5-for acute on chronic diastolic CHF with acute on chronic respiratory failure.  ED Course: Blood pressure  systolic 123XX123, O2 sats greater than 97% on 2 - 3 L O2.  Blood sugar 47.  Potassium 3.5.  WBC 8.3.  UA with trace leukocytes.  BNP 243.  Normal lactic acid 0.9.  Portable chest x-ray shows right base atelectasis with small right pleural effusion.  No airspace consolidation.  With concern for sepsis initial hypotension and tachypnea, code sepsis was initiated, IV Vanco and cefepime given with 2.2 L bolus sepsis fluids given.  D50 given with blood pressure improving to 84. Hospitalist to admit for further evaluation and management.  Patient was ambulated on 3 L O2, initial sats 92%, dropped to 88%, patient became tachypneic.  Review of Systems: As per HPI all other systems reviewed and negative.  Past Medical History:  Diagnosis Date  . Acute diastolic heart failure ([place]) 03/28/2016  . Acute encephalopathy 03/28/2016  . Acute encephalopathy 03/28/2016  . Acute on chronic respiratory failure with hypoxia ([place]) 12/04/2016  . Arthritis   . Asthma   . Atrial fibrillation ([place])    on AC  . Benign neoplasm of ascending colon 05/05/2014  . BPH (benign prostatic hypertrophy)   . CARDIOVASCULAR STUDIES, ABNORMAL 04/27/2009   Qualifier: Diagnosis of  By: Percival Spanish, MD, Farrel Gordon    . Chronic anticoagulation 08/20/2012  . Chronic respiratory failure with hypoxia ([place]) 11/10/2017  . Colon polyps   . COPD mixed type ([place]) 03/28/2016   PFT 06/17/14- severe obstructive airways disease with slight response to bronchodilator. FVC 2.03/42%, FEV1 1.02/28%, ratio 0.5, TLC 82%, DLCO 59%  . Diabetes mellitus   . Diabetic neuropathy, type II diabetes mellitus (  [place]) 09/25/2014  . Diabetic retinopathy ([place]) 05/28/2017  . 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 GERD (gastroesophageal reflux disease)   . HTN (hypertension)    x 3 years  . Hyperlipidemia   . Lung nodules 03/11/2013  . Mediastinal adenopathy 06/20/2013   CT  & PET 2/15   . Memory loss   . MGUS (monoclonal gammopathy of  unknown significance) 10/08/2015  . NHL (non-Hodgkin's lymphoma) ([place])    nhl dx 9/11  . Obesity    exogenous  . Obstructive sleep apnea 05/25/2014   NPSG-  04/07/2014-severe obstructive sleep apnea-AHI 75.1 per hour. CPAP titrated to 16. He required supplemental oxygen at 2 L/ [employer] which he already wears for sleep at home. Weight 338 pounds   . Peripheral vascular insufficiency ([place]) 08/02/2017  . Personal history of colonic polyps 03/02/2009   Qualifier: Diagnosis of  By: Deatra Ina MD, Sandy Salaam   . Sepsis ([place]) 06/07/2015  . Severe obesity (BMI >= 40) ([place]) 05/01/2013  . Shingles   . Sleep apnea    CPAP machine is broken  . Thrombocytopenia ([place]) 11/23/2016    Past Surgical History:  Procedure Laterality Date  . APPENDECTOMY    . COLONOSCOPY WITH PROPOFOL N/A 05/05/2014   Procedure: COLONOSCOPY WITH PROPOFOL;  Surgeon: Inda Castle, MD;  Location: [facility] ENDOSCOPY;  Service: Endoscopy;  Laterality: N/A;     reports that he quit smoking about 9 months ago. His smoking use included cigarettes. He started smoking about 50 years ago. He has a 40.00 pack-year smoking history. He has never used smokeless tobacco. He reports that he does not drink alcohol or use drugs.  Allergies  Allergen Reactions  . Avapro [Irbesartan] Other (See Comments)    "doesnt sit right with me"--light headed  . Lipitor [Atorvastatin Calcium] Other (See Comments)    Leg pain    Family History  Problem Relation Age of Onset  . Liver cancer Mother   . Diabetes Mother   . Heart disease Mother   . Colon cancer Father   . Prostate cancer Father   . Colon polyps Father   . Pulmonary embolism Sister   . Diabetes Sister   . Heart attack Sister   . Aortic aneurysm Sister   . COPD Sister   . Heart disease Sister   . COPD Sister   . Heart disease Sister   . Diabetes Maternal Grandmother     Prior to Admission medications   Medication Sig Start Date End Date Taking? Authorizing Provider  acetaminophen (TYLENOL)  325 MG tablet Take 650 mg by mouth every 6 (six) hours as needed.   Yes [provider]  albuterol (VENTOLIN HFA) 108 (90 Base) MCG/ACT inhaler Inhale 2 puffs into the lungs every 6 (six) hours as needed for wheezing or shortness of breath. 08/23/18  Yes Young, Tarri Fuller D, MD  Cholecalciferol (VITAMIN D3) 5000 UNITS TABS Take 1 tablet by mouth every morning.   Yes [provider]  ELIQUIS 5 MG TABS tablet Take 1 tablet by mouth twice daily 03/04/19  Yes Stacks, [name], MD  furosemide (LASIX) 40 MG tablet Take 1-2 tablets (40-80 mg total) by mouth 2 (two) times daily. Patient takes 80mg  in the morning and 40mg  in the evening Patient taking differently: Take 40 mg by mouth 2 (two) times daily. Patient takes 40mg  in the morning and 40mg  in the evenin 07/10/19  Yes Johnson, Clanford L, MD  insulin NPH Human (NOVOLIN N RELION)  100 UNIT/ML injection Inject 0.5-0.55 mLs (50-55 Units total) into the skin 2 (two) times daily before a meal. 07/10/19  Yes Johnson, Clanford L, MD  insulin regular (NOVOLIN R) 100 units/mL injection Inject 15 units before breakfast and supper Patient taking differently: Inject 12-15 Units into the skin 2 (two) times daily before a meal. Inject 15 units before breakfast and supper 03/26/19  Yes Stacks, [name], MD  ipratropium-albuterol (DUONEB) 0.5-2.5 (3) MG/3ML SOLN Take 3 mLs by nebulization every 6 (six) hours as needed. 08/23/18 07/28/19 Yes Young, Tarri Fuller D, MD  metoprolol tartrate (LOPRESSOR) 100 MG tablet Take 1 tablet by mouth twice daily 05/21/19  Yes Stacks, [name], MD  nystatin (MYCOSTATIN/NYSTOP) powder Apply topically 2 (two) times daily. Apply to groin area and scrotum skin; keep area clean and dry. 06/28/19  Yes Barton Dubois, MD  PARoxetine (PAXIL) 20 MG tablet Take 1 tablet by mouth once daily 05/21/19  Yes Stacks, [name], MD  Semaglutide, 1 MG/DOSE, (OZEMPIC, 1 MG/DOSE,) 2 MG/1.5ML SOPN Inject 1 mg into the skin once a week. 03/26/19  Yes [name], MD   simvastatin (ZOCOR) 40 MG tablet Take 1 tablet by mouth once daily 03/25/19  Yes Stacks, [name], MD  TRELEGY ELLIPTA 100-62.5-25 MCG/INH AEPB Take 1 puff by mouth daily. 06/29/19  Yes [provider]  oxymetazoline (AFRIN) 0.05 % nasal spray Place 1 spray into both nostrils 2 (two) times daily as needed for congestion.    [provider]    Physical Exam: Vitals:   07/28/19 1400 07/28/19 1430 07/28/19 1515 07/28/19 1530  BP: (!) 101/50 95/60 100/74 111/67  Pulse: (!) 52 (!) 51    Resp: (!) 22     Temp:      TempSrc:      SpO2: 100% 100%    Weight:      Height:        Constitutional: NAD, calm, comfortable Vitals:   07/28/19 1400 07/28/19 1430 07/28/19 1515 07/28/19 1530  BP: (!) 101/50 95/60 100/74 111/67  Pulse: (!) 52 (!) 51    Resp: (!) 22     Temp:      TempSrc:      SpO2: 100% 100%    Weight:      Height:       Eyes: PERRL, lids and conjunctivae normal ENMT: Mucous membranes are moist.  Neck: normal, supple, no masses, no thyromegaly Respiratory: clear to auscultation bilaterally, no wheezing, no crackles. Normal respiratory effort. No accessory muscle use.  Cardiovascular: Irregular rate and rhythm, heart sounds difficult to auscultate, no extremity edema. 2+ pedal pulses.   Abdomen: no tenderness, no masses palpated. No hepatosplenomegaly. Bowel sounds positive.  Musculoskeletal: no clubbing / cyanosis. No joint deformity upper and lower extremities. Good ROM, no contractures. Normal muscle tone.  Skin: no rashes, lesions, ulcers. No induration Neurologic: No apparent cranial month, moving extremities spontaneously. Psychiatric: Normal judgment and insight. Alert and oriented x 3. Normal mood.   Labs on Admission: I have personally reviewed following labs and imaging studies  CBC: Recent Labs  Lab 07/28/19 1227  WBC 8.3  NEUTROABS 6.6  HGB 13.3  HCT 42.5  MCV 93.4  PLT 123XX123   Basic Metabolic Panel: Recent Labs  Lab 07/28/19 1227  NA  140  K 3.5  CL 94*  CO2 34*  GLUCOSE 47*  BUN 33*  CREATININE 1.34*  CALCIUM 9.8    Liver Function Tests: Recent Labs  Lab 07/28/19 1227  AST 28  ALT 17  ALKPHOS 90  BILITOT 0.7  PROT 8.2*  ALBUMIN 3.8   Coagulation Profile: Recent Labs  Lab 07/28/19 1227  INR 1.4*   CBG: Recent Labs  Lab 07/28/19 1218 07/28/19 1344  GLUCAP 49* 84   Urine analysis:    Component Value Date/Time   COLORURINE STRAW (A) 07/28/2019 1227   APPEARANCEUR CLEAR 07/28/2019 1227   APPEARANCEUR Clear 05/30/2018 1147   LABSPEC 1.010 07/28/2019 1227   PHURINE 6.0 07/28/2019 1227   GLUCOSEU 50 (A) 07/28/2019 1227   HGBUR NEGATIVE 07/28/2019 [place] 07/28/2019 1227   BILIRUBINUR Negative 05/30/2018 [place] 07/28/2019 1227   PROTEINUR NEGATIVE 07/28/2019 1227   UROBILINOGEN negative 05/01/2013 1115   UROBILINOGEN 0.2 05/18/2010 0352   NITRITE NEGATIVE 07/28/2019 1227   LEUKOCYTESUR TRACE (A) 07/28/2019 1227    Radiological Exams on Admission: DG Chest Port 1 View  Result Date: 07/28/2019 CLINICAL DATA:  Shortness of breath EXAM: PORTABLE CHEST 1 VIEW COMPARISON:  July 08, 2019 FINDINGS: There is a small right pleural effusion. There is mild right base atelectasis. The lungs elsewhere are clear. Heart is mildly enlarged with pulmonary vascularity normal. There is aortic atherosclerosis. No bone lesions. IMPRESSION: Home right base atelectasis with small right pleural effusion. No airspace consolidation. Stable cardiac prominence. Electronically Signed   By: Lowella Grip III M.D.   On: 07/28/2019 12:56    EKG: Independently reviewed.  Atrial fibrillation QTC 577.  No Significant ST or T wave changes compared to prior.  Assessment/Plan Principal Problem:   Dyspnea Active Problems:   Diabetes mellitus type 2, insulin dependent (HCC)   Hypertension   Generalized anxiety disorder   Chronic diastolic heart failure (HCC)   COPD mixed type (HCC)    Atrial fibrillation, chronic (HCC)   Acute on chronic hypoxic respiratory failure- at baseline he is on 3 L, O2 sats dropped to 88% with ambulation in the ED.  Appears to be a chronic issue.  Not apparently dyspneic.  Appears euvolemic.  No chest pain.  Doubt pulmonary embolism he is on Eliquis reports compliance.  BNP unremarkable at 243, compared to prior.  Chest x-ray -atelectasis no airspace consolidation.  May just need to increase patient's chronic O2, especially with activity, his soft blood pressure may also be contributing to his dyspnea on exertion.  Recent notes also report uncontrolled anxiety contributing to his dyspnea symptoms. - Will repeat limited echo considering recent pericardial effusion,  -Supplemental O2  Diabetes mellitus with hypoglycemia-glucose down to 24 last night, 47 in the ED >> 84 after D 50.Marland Kitchen  Patient last took his insulins this morning.  He is on NPH q12h and NovoLog with meals.  Improvement in blood glucose with D50 in ED.  A1c 3/2-9.1. -Monitor glucose closely -Hold home NPH (50 - 55u) and NovoLog 15 units with meals -Sliding scale insulin  Hypotension-recorded blood pressure 87/51 initially,  improved to [id number] systolic 20 minutes later. ?  If initial reading was spurious.  Rules out for sepsis with normal lactic acid 0.9.  Blood pressure currently soft, likely from diuretics.  UA suggests probable UTI with trace leukocytes. - With recent pericardial effusion, though resolved on recent echo 4/1, will repeat limited echo to rule out recurrence of pericardial effusion. -Pending stable blood pressure resume diuretics tomorrow -2.2 L bolus normal saline given, hold off on further IV fluids -IV Vanco, cefepime and metronidazole given in ED, hold off for further broad-spectrum antibiotics, continue with ceftriaxone -Follow-up blood and urine  cultures.  Prolonged QTC-577 (not new) potassium 3.5.  He is on paroxetine. -Check magnesium -Replete K  Chronic diastolic CHF  -stable and compensated.  Recent echo 4/1 EF 60 to 65%.  -Daily weights,  - hold Lasix for now with soft blood pressure   GAD - appears uncontrolled -  resume home paroxetine  Atrial fibrillation,chronic- Rate controlled and on anticoagulation. -Continue metoprolol tartrate--pt was on 100 mg bid at d/c 3/5 -Resume Eliquis  Essential hypertension -Continue metoprolol -Hold home Lasix  COPD-stable. -Resume home bronchodilators.  Morbid obesity -[id number] -lifestyle modification  NonHodgkin's Lymphoma/MGUS -outpt follow up with Dr. Julien Nordmann   DVT prophylaxis: Eliquis Code Status: Full code Family Communication: None at bedside Disposition Plan: ~ 2 days.  Pending stable cardiovascular and respiratory status. Consults called: None Admission status: Observation, telemetry   Bethena Roys MD [facility]  07/28/2019, 7:44 PM

## 2019-07-28 NOTE — ED Notes (Signed)
Date and time results received: 07/28/19 *1259**  Test: **PO2* Critical Value: *<31**  Name of Provider Notified: Sabra Heck**

## 2019-07-28 NOTE — ED Provider Notes (Signed)
[facility] EMERGENCY DEPARTMENT Provider Note   CSN: [id number] Arrival date & time: 07/28/19  1205     History Chief Complaint  Patient presents with  . Shortness of Breath    Justin Ruiz is a 69 y.o. male with past medical history significant for COPD, chronic hypoxic respiratory failure, OSA on CPAP, A. fib on Eliquis, chronic diastolic CHF, insulin-dependent diabetes presents to emergency department today with chief complaint of progressively worsening shortness of breath x3 days.  Patient is at the bedside and is contributing historian.  She states patient used to wear 3 L cannula at all times.  However over the last 1 month this has been increased to 4-1/2 L.  Patient states he feels generalized weakness and that he cannot catch his breath.  He cannot walk as far as he usually does. He has a nonproductive cough which is normal for him per patient.  He was feeling weak yesterday afternoon when his blood sugar was checked it was 24.  EMS was called.  They came and evaluated the patient and recommended transfer to the hospital however patient declined. This morning when they checked his blood sugar it was 77. He decreased his insulin amount and immediately ate breakfast and has multiple snacks throughout the morning. He denies any fever, chills, chest pain, abdominal pain, nausea, vomiting, urinary symptoms, diarrhea, lower extremity edema, numbness, tingling weakness.  Of note patient had recent admission 07/08/2019- 07/10/2019 for SOB and was found to have a large pericardial effusion. He had repeat echo on 07/25/2019 that showed LVEF 60-65% and that pericardial effusion had resolved.  History provided by patient with additional history obtained from chart review.     Past Medical History:  Diagnosis Date  . Acute diastolic heart failure ([place]) 03/28/2016  . Acute encephalopathy 03/28/2016  . Acute encephalopathy 03/28/2016  . Acute on chronic respiratory failure with hypoxia ([place])  12/04/2016  . Arthritis   . Asthma   . Atrial fibrillation ([place])    on AC  . Benign neoplasm of ascending colon 05/05/2014  . BPH (benign prostatic hypertrophy)   . CARDIOVASCULAR STUDIES, ABNORMAL 04/27/2009   Qualifier: Diagnosis of  By: Percival Spanish, MD, Farrel Gordon    . Chronic anticoagulation 08/20/2012  . Chronic respiratory failure with hypoxia ([place]) 11/10/2017  . Colon polyps   . COPD mixed type ([place]) 03/28/2016   PFT 06/17/14- severe obstructive airways disease with slight response to bronchodilator. FVC 2.03/42%, FEV1 1.02/28%, ratio 0.5, TLC 82%, DLCO 59%  . Diabetes mellitus   . Diabetic neuropathy, type II diabetes mellitus ([place]) 09/25/2014  . Diabetic retinopathy ([place]) 05/28/2017  . 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 GERD (gastroesophageal reflux disease)   . HTN (hypertension)    x 3 years  . Hyperlipidemia   . Lung nodules 03/11/2013  . Mediastinal adenopathy 06/20/2013   CT  & PET 2/15   . Memory loss   . 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 NHL (non-Hodgkin's lymphoma) ([place])    nhl dx 9/11  . Obesity    exogenous  . Obstructive sleep apnea 05/25/2014   NPSG-  04/07/2014-severe obstructive sleep apnea-AHI 75.1 per hour. CPAP titrated to 16. He required supplemental oxygen at 2 L/ [employer] which he already wears for sleep at home. Weight 338 pounds   . Peripheral vascular insufficiency ([place]) 08/02/2017  . Personal history of colonic polyps 03/02/2009   Qualifier: Diagnosis of  By: Deatra Ina MD, Sandy Salaam   .  Sepsis ([place]) 06/07/2015  . Severe obesity (BMI >= 40) ([place]) 05/01/2013  . Shingles   . Sleep apnea    CPAP machine is broken  . Thrombocytopenia ([place]) 11/23/2016    Patient Active Problem List   Diagnosis Date Noted  . Acute pericardial effusion 07/08/2019  . Acute on chronic respiratory failure ([place]) 06/25/2019  . Acute on chronic diastolic CHF (congestive heart failure) ([place]) 06/25/2019  . Atrial  fibrillation, chronic ([place]) 06/25/2019  . Uncontrolled type 2 diabetes mellitus with hyperglycemia ([place]) 06/25/2019  . General weakness   . Diabetic infection of right foot ([place]) 12/17/2018  . Chronic respiratory failure with hypoxia ([place]) 11/10/2017  . Peripheral vascular insufficiency ([address]) 08/02/2017  . Diabetic retinopathy ([place]) 05/28/2017  . Thrombocytopenia ([place]) 11/23/2016  . Memory loss 04/13/2016  . Hypomagnesemia   . Chronic diastolic CHF (congestive heart failure) ([place]) 03/28/2016  . COPD mixed type ([place]) 03/28/2016  . Atrial fibrillation with rapid ventricular response ([place]) 03/28/2016  . 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 Chronic diastolic heart failure ([place])   . Diabetic neuropathy, type II diabetes mellitus ([place]) 09/25/2014  . Obstructive sleep apnea 05/25/2014  . Benign neoplasm of ascending colon 05/05/2014  . Excessive daytime sleepiness 01/24/2014  . Mediastinal adenopathy 06/20/2013  . Generalized anxiety disorder 05/01/2013  . Lung nodules 03/11/2013  . NHL (non-Hodgkin's lymphoma) ([place]) 01/26/2010  . Obesity, morbid ([place]) 04/27/2009  . ABNORMAL STRESS ELECTROCARDIOGRAM 03/25/2009  . Diabetes mellitus type 2, insulin dependent ([place]) 04/29/2007  . Hyperlipidemia 04/29/2007  . Hypertension 04/29/2007  . Seasonal and perennial allergic rhinitis 04/29/2007  . G E R D 04/29/2007  . BENIGN PROSTATIC HYPERTROPHY, HX OF 04/29/2007    Past Surgical History:  Procedure Laterality Date  . APPENDECTOMY    . COLONOSCOPY WITH PROPOFOL N/A 05/05/2014   Procedure: COLONOSCOPY WITH PROPOFOL;  Surgeon: Inda Castle, MD;  Location: [facility] ENDOSCOPY;  Service: Endoscopy;  Laterality: N/A;       Family History  Problem Relation Age of Onset  . Liver cancer Mother   . Diabetes Mother   . Heart disease Mother   . Colon cancer Father   . Prostate cancer Father   . Colon polyps Father   . Pulmonary embolism Sister   . Diabetes Sister   .  Heart attack Sister   . Aortic aneurysm Sister   . COPD Sister   . Heart disease Sister   . COPD Sister   . Heart disease Sister   . Diabetes Maternal Grandmother     Social History   Tobacco Use  . Smoking status: Former Smoker    Packs/day: 1.00    Years: 40.00    Pack years: 40.00    Types: Cigarettes    Start date: 12/17/1968    Quit date: 10/18/2018    Years since quitting: 0.7  . Smokeless tobacco: Never Used  . Tobacco comment: 2 ppd; pt reports smoking 2-3 weeks ago, 5-6 cigs/daily  Substance Use Topics  . Alcohol use: No    Alcohol/week: 0.0 standard drinks    Comment: previous  . Drug use: No    Home Medications Prior to Admission medications   Medication Sig Start Date End Date Taking? Authorizing Provider  acetaminophen (TYLENOL) 325 MG tablet Take 650 mg by mouth every 6 (six) hours as needed.   Yes [provider]  albuterol (VENTOLIN HFA) 108 (90 Base) MCG/ACT inhaler Inhale 2 puffs into the lungs every 6 (six) hours as needed  for wheezing or shortness of breath. 08/23/18  Yes Deneise Lever, MD  Accu-Chek FastClix Lancets MISC Test BS QID Dx O97.3 01/28/19   [name], MD  Alcohol Swabs (B-D SINGLE USE SWABS REGULAR) PADS Test BS QID Dx E11.9 01/28/19   [name], MD  ALPRAZolam Duanne Moron) 0.25 MG tablet Take 1 tablet (0.25 mg total) by mouth 3 (three) times daily as needed. for anxiety 07/04/19   [name], MD  Blood Glucose Monitoring Suppl (ACCU-CHEK AVIVA PLUS) w/Device KIT Test BS QID DX E11.9 01/28/19   [name], MD  cephALEXin (KEFLEX) 500 MG capsule Take 500 mg by mouth 4 (four) times daily.    [provider]  Cholecalciferol (VITAMIN D3) 5000 UNITS TABS Take 1 tablet by mouth every morning.    [provider]  ELIQUIS 5 MG TABS tablet Take 1 tablet by mouth twice daily 03/04/19   [name], MD  furosemide (LASIX) 40 MG tablet Take 1-2 tablets (40-80 mg total) by mouth 2 (two) times daily. Patient takes '80mg'$   in the morning and '40mg'$  in the evening 07/10/19   Irwin Brakeman L, MD  glucose blood (ACCU-CHEK AVIVA PLUS) test strip Test BS QID Dx E11.9 01/28/19   [name], MD  insulin NPH Human (NOVOLIN N RELION) 100 UNIT/ML injection Inject 0.5-0.55 mLs (50-55 Units total) into the skin 2 (two) times daily before a meal. 07/10/19   Johnson, Clanford L, MD  insulin regular (NOVOLIN R) 100 units/mL injection Inject 15 units before breakfast and supper Patient taking differently: Inject 12-15 Units into the skin 2 (two) times daily before a meal. Inject 15 units before breakfast and supper 03/26/19   [name], MD  ipratropium-albuterol (DUONEB) 0.5-2.5 (3) MG/3ML SOLN Take 3 mLs by nebulization every 6 (six) hours as needed. 08/23/18 07/08/19  Deneise Lever, MD  Lancet Devices (LANCING DEVICE) MISC USE AS DIRECTED 11/13/12   Chipper Herb, MD  metoprolol tartrate (LOPRESSOR) 100 MG tablet Take 1 tablet by mouth twice daily 05/21/19   [name], MD  nystatin (MYCOSTATIN/NYSTOP) powder Apply topically 2 (two) times daily. Apply to groin area and scrotum skin; keep area clean and dry. 06/28/19   Barton Dubois, MD  oxymetazoline (AFRIN) 0.05 % nasal spray Place 1 spray into both nostrils 2 (two) times daily as needed for congestion.    [provider]  PARoxetine (PAXIL) 20 MG tablet Take 1 tablet by mouth once daily 05/21/19   [name], MD  Semaglutide, 1 MG/DOSE, (OZEMPIC, 1 MG/DOSE,) 2 MG/1.5ML SOPN Inject 1 mg into the skin once a week. 03/26/19   [name], MD  simvastatin (ZOCOR) 40 MG tablet Take 1 tablet by mouth once daily 03/25/19   [name], MD  TRELEGY ELLIPTA 100-62.5-25 MCG/INH AEPB Take 1 puff by mouth daily. 06/29/19   [provider]    Allergies    Avapro [irbesartan] and Lipitor [atorvastatin calcium]  Review of Systems   Review of Systems All other systems are reviewed and are negative for acute change except as noted in the HPI.  Physical  Exam Updated Vital Signs BP (!) 87/51 (BP Location: Left Arm)   Pulse 68   Temp 98.1 F (36.7 C) (Oral)   Resp (!) 21   Ht '5\' 9"'$  (1.753 m)   Wt 135 kg   SpO2 97%   BMI 43.95 kg/m   Physical Exam Vitals and nursing note reviewed.  Constitutional:      General: He is not in  acute distress.    Appearance: He is not ill-appearing.  HENT:     Head: Normocephalic and atraumatic.     Right Ear: Tympanic membrane and external ear normal.     Left Ear: Tympanic membrane and external ear normal.     Nose: Nose normal.     Mouth/Throat:     Mouth: Mucous membranes are moist.     Pharynx: Oropharynx is clear.  Eyes:     General: No scleral icterus.       Right eye: No discharge.        Left eye: No discharge.     Extraocular Movements: Extraocular movements intact.     Conjunctiva/sclera: Conjunctivae normal.     Pupils: Pupils are equal, round, and reactive to light.  Neck:     Vascular: No JVD.  Cardiovascular:     Rate and Rhythm: Normal rate and regular rhythm.     Pulses: Normal pulses.          Radial pulses are 2+ on the right side and 2+ on the left side.     Heart sounds: Normal heart sounds.  Pulmonary:     Comments: Patient is tachypneic. lung sounds diminished throughout. Symmetric chest rise. No wheezing, rales, or rhonchi.  Oxygen saturation is 97% on 4.5 liters. Abdominal:     Comments: Abdomen is soft, non-distended, and non-tender in all quadrants. No rigidity, no guarding. No peritoneal signs.  Musculoskeletal:        General: Normal range of motion.     Cervical back: Normal range of motion.     Right lower leg: No edema.     Left lower leg: No edema.  Skin:    General: Skin is warm and dry.     Capillary Refill: Capillary refill takes less than 2 seconds.  Neurological:     Mental Status: He is oriented to person, place, and time.     GCS: GCS eye subscore is 4. GCS verbal subscore is 5. GCS motor subscore is 6.     Comments: Patient tells the year is  2001 and the month is March.  His wife states this is his normal confusion  Speech is clear and goal oriented, follows commands CN III-XII intact, no facial droop Normal strength in upper and lower extremities bilaterally including dorsiflexion and plantar flexion, strong and equal grip strength Sensation normal to light and sharp touch Moves extremities without ataxia, coordination intact Normal finger to nose and rapid alternating movements    Psychiatric:        Behavior: Behavior normal.       ED Results / Procedures / Treatments   Labs (all labs ordered are listed, but only abnormal results are displayed) Labs Reviewed  COMPREHENSIVE METABOLIC PANEL - Abnormal; Notable for the following components:      Result Value   Chloride 94 (*)    CO2 34 (*)    Glucose, Bld 47 (*)    BUN 33 (*)    Creatinine, Ser 1.34 (*)    Total Protein 8.2 (*)    GFR calc non Af Amer 54 (*)    All other components within normal limits  PROTIME-INR - Abnormal; Notable for the following components:   Prothrombin Time 16.9 (*)    INR 1.4 (*)    All other components within normal limits  BRAIN NATRIURETIC PEPTIDE - Abnormal; Notable for the following components:   B Natriuretic Peptide 243.0 (*)    All other components  within normal limits  BLOOD GAS, VENOUS - Abnormal; Notable for the following components:   pCO2, Ven 68.8 (*)    pO2, Ven <31.0 (*)    Bicarbonate 32.0 (*)    Acid-Base Excess 11.9 (*)    All other components within normal limits  CBG MONITORING, ED - Abnormal; Notable for the following components:   Glucose-Capillary 49 (*)    All other components within normal limits  CULTURE, BLOOD (ROUTINE X 2)  CULTURE, BLOOD (ROUTINE X 2)  URINE CULTURE  RESPIRATORY PANEL BY RT PCR (FLU A&B, COVID)  LACTIC ACID, PLASMA  CBC WITH DIFFERENTIAL/PLATELET  APTT  URINALYSIS, ROUTINE W REFLEX MICROSCOPIC  CBG MONITORING, ED    EKG EKG Interpretation  Date/Time:  Sunday July 28 2019  12:14:02 EDT Ventricular Rate:  62 PR Interval:    QRS Duration: 150 QT Interval:  568 QTC Calculation: 577 R Axis:   66 Text Interpretation: Atrial fibrillation Right bundle branch block Since last tracing rate slower Confirmed by Noemi Chapel [phone]) on 07/28/2019 12:23:21 PM   Radiology DG Chest Port 1 View  Result Date: 07/28/2019 CLINICAL DATA:  Shortness of breath EXAM: PORTABLE CHEST 1 VIEW COMPARISON:  July 08, 2019 FINDINGS: There is a small right pleural effusion. There is mild right base atelectasis. The lungs elsewhere are clear. Heart is mildly enlarged with pulmonary vascularity normal. There is aortic atherosclerosis. No bone lesions. IMPRESSION: Home right base atelectasis with small right pleural effusion. No airspace consolidation. Stable cardiac prominence. Electronically Signed   By: Lowella Grip III M.D.   On: 07/28/2019 12:56    Procedures Procedures (including critical care time)  Medications Ordered in ED Medications  metroNIDAZOLE (FLAGYL) IVPB 500 mg (500 mg Intravenous New Bag/Given 07/28/19 1300)  ceFEPIme (MAXIPIME) 2 g in sodium chloride 0.9 % 100 mL IVPB (0 g Intravenous Stopped 07/28/19 1330)  vancomycin (VANCOREADY) IVPB 2000 mg/400 mL (has no administration in time range)    Followed by  vancomycin (VANCOCIN) IVPB 1000 mg/200 mL premix (has no administration in time range)  sodium chloride 0.9 % bolus 1,000 mL (1,000 mLs Intravenous New Bag/Given 07/28/19 1251)    And  sodium chloride 0.9 % bolus 1,000 mL (1,000 mLs Intravenous New Bag/Given 07/28/19 1307)    And  sodium chloride 0.9 % bolus 200 mL (0 mLs Intravenous Stopped 07/28/19 1340)  dextrose 50 % solution 50 mL (50 mLs Intravenous Given 07/28/19 1251)  albuterol (VENTOLIN HFA) 108 (90 Base) MCG/ACT inhaler 4-6 puff (4 puffs Inhalation Given 07/28/19 1304)  aerochamber plus with mask device 1 each (1 each Other Given 07/28/19 1305)    ED Course  I have reviewed the triage vital signs and the nursing  notes.  Pertinent labs & imaging results that were available during my care of the patient were reviewed by me and considered in my medical decision making (see chart for details).  Clinical Course as of Jul 28 1342  Sun Jul 28, 2019  1234 Code sepsis initiated. Antibiotics for unknown source and IVF '30mg'$ /kg ordered    [KA]    Clinical Course User Index [KA] Cherre Robins, PA-C   Vitals:   07/28/19 1217 07/28/19 1230 07/28/19 1302 07/28/19 1330  BP: 99/62 (!) 103/58 115/63 114/64  Pulse: 67 79  66  Resp:  (!) 21 (!) 23 19  Temp:      TempSrc:      SpO2: 97% 100%  100%  Weight:      Height:  MDM Rules/Calculators/A&P                      Patient seen and examined. Patient presents awake, afebrile. Vitals in triage are indicative of SIRS with his hypotension and tachypnea. Code sepsis was initiated, fluids 30 mg/kg and antibiotics for unknown source. His chief complaint is shortness of breath and he has a nonproductive cough that is typical for him, not overly suggestive of pneumonia. On my exam his lung sounds are diminished throughout, no wheezing, rales, rhonchi or stridor heard.  No abdominal tenderness, no peritoneal signs.  No signs of volume overload.  No lower extremity edema.  He is following commands and alert.  He tells me the year is 2002 and the month is March, his wife at the bedside states this normal for him to not know year and month. CBG is 49, amp D50 ordered. EKG afib without RVR, no obvious ischemia.  CBC without leukocytosis, no anemia. CMP Without severe electrolyte derangement, creatinine slightly elevated at 1.34 when compared to x 2 weeks ago when it was 1.18. Lactic acid within normal range. PTT normal, INR slightly up at 1.4. BNP elevated at 243. VBG with normal pH, bicarb elevated, PO2 is <31 however this does not correlate with clinical picture as patient has oxygen saturation of 100% on his usual 3L.  UA without signs of infection. Chest x-ray  viewed by me shows small right pleural effusion, no acute infectious processes. Blood cultures are pending. Covid PCR negative. On reassessment blood pressure has improved after fluids. Repeat CBG is 84.  Patient had desat to 88% on his 3 L nasal cannula with ambulation.  He was tachypneic, had unsteady gait and was only able to walk a short distance. This case was discussed with Dr. Sabra Heck who has seen the patient and agrees with plan to admit with concern for his hypotension, need for possible echo with findings of pleural effusion on chest xray today. Spoke with Dr. Denton Brick with hospitalist service who agrees to assume care of patient and bring into the hospital for further evaluation and management.  ]  Portions of this note were generated with Dragon dictation software. Dictation errors may occur despite best attempts at proofreading.   Final Clinical Impression(s) / ED Diagnoses Final diagnoses:  Acute on chronic respiratory failure, unspecified whether with hypoxia or hypercapnia [facility])    Rx / DC Orders ED Discharge Orders    None       Flint Melter 07/28/19 1950    Noemi Chapel, MD 07/31/19 [phone]

## 2019-07-28 NOTE — ED Provider Notes (Signed)
This patient is a 69 year old male, he has a known history of diabetes, reactive airway disease, atrial fibrillation on Eliquis, he is on insulin and Ellipta, simvastatin and furosemide.  He presents with increasing shortness of breath over the last several days.  On exam he has diminished breath sounds bilaterally, he has been having to turn up his oxygen at home.  He is able to speak in full to short and sentences.  He has no peripheral edema.  EKG shows rate controlled A. fib.  Blood pressure is slightly low but improved significantly with some IV fluids, there is no fever, oxygen seems okay on baseline 3 L.  Albuterol treatments given, labs have been ordered to evaluate for possible other causes of the patient's shortness of breath though I doubt that this would be pulmonary embolism given his anticoagulated status.  He also has a history of prior pericardial effusion, last echo  Medical screening examination/treatment/procedure(s) were conducted as a shared visit with non-physician practitioner(s) and myself.  I personally evaluated the patient during the encounter.  Clinical Impression:   Final diagnoses:  Acute on chronic respiratory failure, unspecified whether with hypoxia or hypercapnia (HCC)         Noemi Chapel, MD 07/31/19 [phone]

## 2019-07-28 NOTE — Progress Notes (Signed)
Patient wears oxygen 3 liters all the time. CPAP placed in room.  Patient wears Full mask at pressure of 16 , sleep study done by at [facility] by Baird Lyons MD. Inhalers have been placed in room (  Breo and Incruse). Will start in am as patient had taken at home and they are once a day. Morning.

## 2019-07-28 NOTE — ED Notes (Signed)
Ambulated pt on 3L/min [place]; O2 at 92 % before getting out of bed; ambulated approximately 20 feet; gait became unsteady and legs became shaky; pt reports increased SOB; O2 de-sat to 88%; pt tachypneic upon returning to room

## 2019-07-28 NOTE — ED Notes (Signed)
CBG 84. 

## 2019-07-28 NOTE — ED Notes (Signed)
Ambulated Pt. Stated he did not feel dizzy and that he felt good.

## 2019-07-28 NOTE — Plan of Care (Signed)
  Problem: Education: Goal: Knowledge of General Education information will improve Description Including pain rating scale, medication(s)/side effects and non-pharmacologic comfort measures Outcome: Progressing   Problem: Health Behavior/Discharge Planning: Goal: Ability to manage health-related needs will improve Outcome: Progressing   

## 2019-07-28 NOTE — ED Triage Notes (Signed)
Increasing shortness of breath x 3 days. Wears 3l o2 continuously

## 2019-07-29 ENCOUNTER — Other Ambulatory Visit: Payer: Self-pay | Admitting: Family Medicine

## 2019-07-29 ENCOUNTER — Other Ambulatory Visit: Payer: Self-pay | Admitting: Pharmacist

## 2019-07-29 ENCOUNTER — Ambulatory Visit: Payer: Medicare Other | Admitting: *Deleted

## 2019-07-29 ENCOUNTER — Observation Stay (HOSPITAL_COMMUNITY): Payer: Medicare Other

## 2019-07-29 DIAGNOSIS — I959 Hypotension, unspecified: Secondary | ICD-10-CM | POA: Diagnosis present

## 2019-07-29 DIAGNOSIS — J9601 Acute respiratory failure with hypoxia: Secondary | ICD-10-CM | POA: Diagnosis present

## 2019-07-29 DIAGNOSIS — J9811 Atelectasis: Secondary | ICD-10-CM | POA: Diagnosis present

## 2019-07-29 DIAGNOSIS — E119 Type 2 diabetes mellitus without complications: Secondary | ICD-10-CM

## 2019-07-29 DIAGNOSIS — Z794 Long term (current) use of insulin: Secondary | ICD-10-CM

## 2019-07-29 DIAGNOSIS — F329 Major depressive disorder, single episode, unspecified: Secondary | ICD-10-CM | POA: Diagnosis present

## 2019-07-29 DIAGNOSIS — J9602 Acute respiratory failure with hypercapnia: Secondary | ICD-10-CM | POA: Diagnosis present

## 2019-07-29 DIAGNOSIS — Z6841 Body Mass Index (BMI) 40.0 and over, adult: Secondary | ICD-10-CM | POA: Diagnosis not present

## 2019-07-29 DIAGNOSIS — Z20822 Contact with and (suspected) exposure to covid-19: Secondary | ICD-10-CM | POA: Diagnosis present

## 2019-07-29 DIAGNOSIS — I482 Chronic atrial fibrillation, unspecified: Secondary | ICD-10-CM | POA: Diagnosis not present

## 2019-07-29 DIAGNOSIS — E785 Hyperlipidemia, unspecified: Secondary | ICD-10-CM | POA: Diagnosis present

## 2019-07-29 DIAGNOSIS — I5032 Chronic diastolic (congestive) heart failure: Secondary | ICD-10-CM

## 2019-07-29 DIAGNOSIS — J9621 Acute and chronic respiratory failure with hypoxia: Secondary | ICD-10-CM | POA: Diagnosis present

## 2019-07-29 DIAGNOSIS — I309 Acute pericarditis, unspecified: Secondary | ICD-10-CM | POA: Diagnosis not present

## 2019-07-29 DIAGNOSIS — Z87891 Personal history of nicotine dependence: Secondary | ICD-10-CM | POA: Diagnosis not present

## 2019-07-29 DIAGNOSIS — F411 Generalized anxiety disorder: Secondary | ICD-10-CM | POA: Diagnosis present

## 2019-07-29 DIAGNOSIS — Z79899 Other long term (current) drug therapy: Secondary | ICD-10-CM | POA: Diagnosis not present

## 2019-07-29 DIAGNOSIS — D472 Monoclonal gammopathy: Secondary | ICD-10-CM | POA: Diagnosis present

## 2019-07-29 DIAGNOSIS — G4733 Obstructive sleep apnea (adult) (pediatric): Secondary | ICD-10-CM | POA: Diagnosis present

## 2019-07-29 DIAGNOSIS — J9622 Acute and chronic respiratory failure with hypercapnia: Secondary | ICD-10-CM | POA: Diagnosis present

## 2019-07-29 DIAGNOSIS — R0600 Dyspnea, unspecified: Secondary | ICD-10-CM | POA: Diagnosis not present

## 2019-07-29 DIAGNOSIS — C8590 Non-Hodgkin lymphoma, unspecified, unspecified site: Secondary | ICD-10-CM | POA: Diagnosis present

## 2019-07-29 DIAGNOSIS — J449 Chronic obstructive pulmonary disease, unspecified: Secondary | ICD-10-CM

## 2019-07-29 DIAGNOSIS — I10 Essential (primary) hypertension: Secondary | ICD-10-CM

## 2019-07-29 DIAGNOSIS — E1165 Type 2 diabetes mellitus with hyperglycemia: Secondary | ICD-10-CM | POA: Diagnosis not present

## 2019-07-29 DIAGNOSIS — E11649 Type 2 diabetes mellitus with hypoglycemia without coma: Secondary | ICD-10-CM | POA: Diagnosis present

## 2019-07-29 DIAGNOSIS — I110 Hypertensive heart disease with heart failure: Secondary | ICD-10-CM | POA: Diagnosis present

## 2019-07-29 DIAGNOSIS — Z7951 Long term (current) use of inhaled steroids: Secondary | ICD-10-CM | POA: Diagnosis not present

## 2019-07-29 DIAGNOSIS — Z7901 Long term (current) use of anticoagulants: Secondary | ICD-10-CM | POA: Diagnosis not present

## 2019-07-29 LAB — BASIC METABOLIC PANEL
Anion gap: 7 (ref 5–15)
BUN: 27 mg/dL — ABNORMAL HIGH (ref 8–23)
CO2: 32 mmol/L (ref 22–32)
Calcium: 9 mg/dL (ref 8.9–10.3)
Chloride: 101 mmol/L (ref 98–111)
Creatinine, Ser: 1.17 mg/dL (ref 0.61–1.24)
GFR calc Af Amer: >60 mL/min (ref >60)
GFR calc non Af Amer: >60 mL/min (ref >60)
Glucose, Bld: 129 mg/dL — ABNORMAL HIGH (ref 70–99)
Potassium: 4.3 mmol/L (ref 3.5–5.1)
Sodium: 140 mmol/L (ref 135–145)

## 2019-07-29 LAB — ECHOCARDIOGRAM LIMITED
Height: 69 in
Weight: 4761.94 oz

## 2019-07-29 LAB — GLUCOSE, CAPILLARY
Glucose-Capillary: 128 mg/dL — ABNORMAL HIGH (ref 70–99)
Glucose-Capillary: 277 mg/dL — ABNORMAL HIGH (ref 70–99)
Glucose-Capillary: 221 mg/dL — ABNORMAL HIGH (ref 70–99)
Glucose-Capillary: 209 mg/dL — ABNORMAL HIGH (ref 70–99)

## 2019-07-29 MED ORDER — ALBUTEROL SULFATE (2.5 MG/3ML) 0.083% IN NEBU: 2.5000 mg | INHALATION_SOLUTION | RESPIRATORY_TRACT | Status: DC | PRN

## 2019-07-29 MED ORDER — BUDESONIDE-FORMOTEROL FUMARATE 160-4.5 MCG/ACT IN AERO: 2.0000 | INHALATION_SPRAY | Freq: Two times a day (BID) | RESPIRATORY_TRACT | 11 refills | Status: DC

## 2019-07-29 MED ORDER — ALBUTEROL SULFATE (2.5 MG/3ML) 0.083% IN NEBU: 2.5000 mg | INHALATION_SOLUTION | Freq: Four times a day (QID) | RESPIRATORY_TRACT | Status: DC

## 2019-07-29 MED ORDER — ARFORMOTEROL TARTRATE 15 MCG/2ML IN NEBU
15.0000 ug | INHALATION_SOLUTION | Freq: Two times a day (BID) | RESPIRATORY_TRACT | Status: DC
  Administered 2019-07-29 – 2019-07-30 (×2): 15 ug via RESPIRATORY_TRACT
  Filled 2019-07-29 (×2): qty 2

## 2019-07-29 MED ORDER — BUDESONIDE 0.25 MG/2ML IN SUSP
0.2500 mg | Freq: Two times a day (BID) | RESPIRATORY_TRACT | Status: DC
  Administered 2019-07-29 – 2019-07-30 (×2): 0.25 mg via RESPIRATORY_TRACT
  Filled 2019-07-29 (×2): qty 2

## 2019-07-29 MED ORDER — REVEFENACIN 175 MCG/3ML IN SOLN
175.0000 ug | Freq: Every day | RESPIRATORY_TRACT | Status: DC
  Administered 2019-07-30: 175 ug via RESPIRATORY_TRACT
  Filled 2019-07-29 (×4): qty 3

## 2019-07-29 MED ORDER — ALPRAZOLAM 0.25 MG PO TABS: 0.2500 mg | ORAL_TABLET | Freq: Three times a day (TID) | ORAL | Status: DC | PRN

## 2019-07-29 MED ORDER — INSULIN ASPART 100 UNIT/ML ~~LOC~~ SOLN
10.0000 [IU] | Freq: Three times a day (TID) | SUBCUTANEOUS | Status: DC
  Administered 2019-07-29 – 2019-07-30 (×4): 10 [IU] via SUBCUTANEOUS

## 2019-07-29 MED ORDER — BISOPROLOL FUMARATE 5 MG PO TABS
5.0000 mg | ORAL_TABLET | Freq: Two times a day (BID) | ORAL | Status: DC
  Administered 2019-07-29 – 2019-07-30 (×2): 5 mg via ORAL
  Filled 2019-07-29 (×3): qty 1

## 2019-07-29 NOTE — Progress Notes (Signed)
*  PRELIMINARY RESULTS* Echocardiogram Limited 2-D Echocardiogram has been performed.  Justin Ruiz 07/29/2019, 9:34 AM

## 2019-07-29 NOTE — Progress Notes (Addendum)
PROGRESS NOTE [name]  Justin Ruiz  [id number]  DOB: 12-09-1950  DOA: 07/28/2019 PCP: [name], MD   Brief Admission Hx: 69 y.o. male with medical history significant for diastolic CHF, chronic respiratory failure, MGUS, non-Hodgkin's lymphoma atrial fibrillation with RVR, morbid obesity, diabetes mellitus.  Patient presented to the ED with complaints of difficulty with breathing, progressive over the past month.  Reports at rest he is okay.  But with short ambulation he gets short of breath.  He reports a dry cough over the past 2 days.  No fevers, no chills.  No chest pain.  He denies swelling of his lower extremities. He reports compliance with his Eliquis and Lasix every day.  He denies bloating or weight gain.  At baseline patient is on 3 L of oxygen, but he reports he has had to gradually increase it to 4-1/2 L over the past month.   He had a recent hospitalization for a moderately sized pericardial effusion but subsequent Echo shows that it had resolved.   MDM/Assessment & Plan:   1. Acute on chronic respiratory failure with hypoxia-I suspect this is likely multifactorial.  He has had higher oxygen requirement than when he was discharged last month.  We are doing a new dyspnea work-up on him now.  His echocardiogram is being repeated to be sure that his pericardial effusion has not returned.  He seems to be better with higher oxygen supplementation.  He is on apixaban and reports taking regularly so PE is lower on the differential.  He has atelectatic changes on chest x-ray and will encourage some incentive spirometry and ambulation.  I have asked for a pulmonary consultation to assist with the work-up.  He is followed by Dr. Keturah Barre outpatient.  Pt has increasing oxygen requirement from his baseline of 3L now up to 4.5L, unable to speak in full sentences.  2. Type 2 diabetes mellitus with hyperglycemia-he presented with a severe hypoglycemia on admission.  This was  likely from NPH and not eating well.  His insulin is being held temporarily.  Will not resume NPH and NovoLog with meals.  His A1c is 9.1% checked last month. 3. Chronic atrial fibrillation-he has been resumed on his home rate control medication metoprolol and he is fully anticoagulated on apixaban. 4. GAD-he has been started on paroxetine which will be continued. 5. Essential hypertension-presented with some soft pressures that have recovered.  Holding parameters for his metoprolol added.  Temporarily held home Lasix.  Likely can restart Lasix tomorrow. 6. COPD-we have resumed all of his home bronchodilators.  He does not seem to be in acute exacerbation at this time.  I have asked for a inpatient pulmonary consultation. 7. Non-Hodgkin's lymphoma/MGUS-she is followed outpatient by Dr. Earlie Server 8. OSA - on CPAP, did not wear last night, asked to please comply.   DVT prophylaxis: Apixaban Code Status: Full Family Communication: Patient's plan of care discussed with him at the bedside.  He verbalizes understanding. Disposition Plan: Inpatient work-up for progressive dyspnea, unable to speak in full sentences, increasing oxygen requirement from baseline needs, increasing dyspnea on exertion, pulmonary consultation pending, 2D echocardiogram pending   Consultants:  Pulmonary Dr. Melvyn Novas  Procedures:  2D echocardiogram  Antimicrobials:  n/a  Subjective: Patient is sitting up on the bed he reports that he is very short of breath unable to speak in full sentences.  He has had to increase his baseline oxygen up to 4.5 L from baseline of 3L.  He says  he has been progressively getting short of breath over the past month.  He does feel somewhat better with the increased oxygen.  He denies chest pain and pressure.  He denies palpitations.  Objective: Vitals:   07/29/19 0524 07/29/19 0755 07/29/19 0800 07/29/19 1000  BP: (!) 100/53   (!) 101/57  Pulse: (!) 57   71  Resp:      Temp:      TempSrc:       SpO2: 92% 100% 100%   Weight:      Height:        Intake/Output Summary (Last 24 hours) at 07/29/2019 1157 Last data filed at 07/29/2019 0300 Gross per 24 hour  Intake 2900.15 ml  Output --  Net 2900.15 ml   Filed Weights   07/28/19 1211  Weight: 135 kg    REVIEW OF SYSTEMS  As per history otherwise all reviewed and reported negative  Exam:  General exam: Chronically ill-appearing male he is sitting up on side of the bed he is in mild distress.  He is cooperative.  Unable to speak in full sentences.  Respiratory system: Poor air movement bilaterally. No increased work of breathing. Cardiovascular system: Irregularly irregular S1 & S2 heard. No JVD, murmurs, gallops, clicks or pedal edema. Gastrointestinal system: Abdomen is nondistended, soft and nontender. Normal bowel sounds heard. Central nervous system: Alert and oriented. No focal neurological deficits. Extremities: Trace pretibial edema bilateral lower extremity.  Data Reviewed: Basic Metabolic Panel: Recent Labs  Lab 07/28/19 1227 07/28/19 1242 07/29/19 0431  NA 140  --  140  K 3.5  --  4.3  CL 94*  --  101  CO2 34*  --  32  GLUCOSE 47*  --  129*  BUN 33*  --  27*  CREATININE 1.34*  --  1.17  CALCIUM 9.8  --  9.0  MG  --  1.9  --    Liver Function Tests: Recent Labs  Lab 07/28/19 1227  AST 28  ALT 17  ALKPHOS 90  BILITOT 0.7  PROT 8.2*  ALBUMIN 3.8   No results for input(s): LIPASE, AMYLASE in the last 168 hours. No results for input(s): AMMONIA in the last 168 hours. CBC: Recent Labs  Lab 07/28/19 1227  WBC 8.3  NEUTROABS 6.6  HGB 13.3  HCT 42.5  MCV 93.4  PLT 288   Cardiac Enzymes: No results for input(s): CKTOTAL, CKMB, CKMBINDEX, TROPONINI in the last 168 hours. CBG (last 3)  Recent Labs    07/28/19 2159 07/29/19 0806 07/29/19 1143  GLUCAP 195* 128* 277*   Recent Results (from the past 240 hour(s))  Blood Culture (routine x 2)     Status: None (Preliminary result)    Collection Time: 07/28/19 12:41 PM   Specimen: Right Antecubital; Blood  Result Value Ref Range Status   Specimen Description   Final    RIGHT ANTECUBITAL BOTTLES DRAWN AEROBIC AND ANAEROBIC   Special Requests Blood Culture adequate volume  Final   Culture   Final    NO GROWTH < 24 HOURS Performed at [facility], [address] [place], [place] [address]    Report Status PENDING  Incomplete  Respiratory Panel by RT PCR (Flu A&B, Covid) - Nasopharyngeal Swab     Status: None   Collection Time: 07/28/19 12:41 PM   Specimen: Nasopharyngeal Swab  Result Value Ref Range Status   SARS Coronavirus 2 by RT PCR NEGATIVE NEGATIVE Final    Comment: (NOTE) SARS-CoV-2 target  nucleic acids are NOT DETECTED. The SARS-CoV-2 RNA is generally detectable in upper respiratoy specimens during the acute phase of infection. The lowest concentration of SARS-CoV-2 viral copies this assay can detect is 131 copies/mL. A negative result does not preclude SARS-Cov-2 infection and should not be used as the sole basis for treatment or other patient management decisions. A negative result may occur with  improper specimen collection/handling, submission of specimen other than nasopharyngeal swab, presence of viral mutation(s) within the areas targeted by this assay, and inadequate number of viral copies (<131 copies/mL). A negative result must be combined with clinical observations, patient history, and epidemiological information. The expected result is Negative. Fact Sheet for Patients:  PinkCheek.be Fact Sheet for Healthcare Providers:  GravelBags.it This test is not yet ap proved or cleared by the [place] FDA and  has been authorized for detection and/or diagnosis of SARS-CoV-2 by FDA under an Emergency Use Authorization (EUA). This EUA will remain  in effect (meaning this test can be used) for the duration of the COVID-19 declaration under  Section 564(b)(1) of the Act, 21 U.S.C. section 360bbb-3(b)(1), unless the authorization is terminated or revoked sooner.    Influenza A by PCR NEGATIVE NEGATIVE Final   Influenza B by PCR NEGATIVE NEGATIVE Final    Comment: (NOTE) The Xpert Xpress SARS-CoV-2/FLU/RSV assay is intended as an aid in  the diagnosis of influenza from Nasopharyngeal swab specimens and  should not be used as a sole basis for treatment. Nasal washings and  aspirates are unacceptable for Xpert Xpress SARS-CoV-2/FLU/RSV  testing. Fact Sheet for Patients: PinkCheek.be Fact Sheet for Healthcare Providers: GravelBags.it This test is not yet approved or cleared by the [place] FDA and  has been authorized for detection and/or diagnosis of SARS-CoV-2 by  FDA under an Emergency Use Authorization (EUA). This EUA will remain  in effect (meaning this test can be used) for the duration of the  Covid-19 declaration under Section 564(b)(1) of the Act, 21  U.S.C. section 360bbb-3(b)(1), unless the authorization is  terminated or revoked. Performed at [facility], [address], [place], [place] [address]   Blood Culture (routine x 2)     Status: None (Preliminary result)   Collection Time: 07/28/19 12:42 PM   Specimen: Left Antecubital; Blood  Result Value Ref Range Status   Specimen Description   Final    LEFT ANTECUBITAL BOTTLES DRAWN AEROBIC AND ANAEROBIC   Special Requests Blood Culture adequate volume  Final   Culture   Final    NO GROWTH < 24 HOURS Performed at [facility], [address], [place], [place] [address]    Report Status PENDING  Incomplete     Studies: DG Chest Port 1 View  Result Date: 07/28/2019 CLINICAL DATA:  Shortness of breath EXAM: PORTABLE CHEST 1 VIEW COMPARISON:  July 08, 2019 FINDINGS: There is a small right pleural effusion. There is mild right base atelectasis. The lungs elsewhere are clear. Heart is mildly enlarged  with pulmonary vascularity normal. There is aortic atherosclerosis. No bone lesions. IMPRESSION: Home right base atelectasis with small right pleural effusion. No airspace consolidation. Stable cardiac prominence. Electronically Signed   By: Lowella Grip III M.D.   On: 07/28/2019 12:56   Scheduled Meds: . apixaban  5 mg Oral BID  . fluticasone furoate-vilanterol  1 puff Inhalation Daily  . insulin aspart  0-5 Units Subcutaneous QHS  . insulin aspart  0-9 Units Subcutaneous TID WC  . metoprolol tartrate  100 mg Oral  BID  . PARoxetine  20 mg Oral Daily  . umeclidinium bromide  1 puff Inhalation Daily   Continuous Infusions: . cefTRIAXone (ROCEPHIN)  IV 2 g (07/28/19 2151)   Principal Problem:   Dyspnea Active Problems:   Diabetes mellitus type 2, insulin dependent (HCC)   Hypertension   Generalized anxiety disorder   Chronic diastolic heart failure (HCC)   COPD mixed type (HCC)   Atrial fibrillation, chronic ([place])  Time spent:   Irwin Brakeman, MD [facility] 07/29/2019, 11:57 AM    LOS: 0 days  How to contact the [facility] Attending or Consulting provider [place] or covering provider during after hours [place], for this patient?  1. Check the care team in [facility] and look for a) attending/consulting [facility] provider listed and b) the [facility] team listed 2. Log into [email or web address] and use [place]'s universal password to access. If you do not have the password, please contact the hospital operator. 3. Locate the [facility] provider you are looking for under [facility] and page to a number that you can be directly reached. 4. If you still have difficulty reaching the provider, please page the [facility] (Director on Call) for the Hospitalists listed on amion for assistance.

## 2019-07-29 NOTE — Progress Notes (Signed)
Patient has CPAP unit at bedside. RT filled water chamber with sterile water. Patient states he does not need any help placing on CPAP when ready. RT informed patient if he has any trouble have RN contact RT.

## 2019-07-29 NOTE — Consult Note (Signed)
PULMONARY / CRITICAL CARE MEDICINE   NAME:  Justin Ruiz, MRN:  [id number], DOB:  02/23/1951, LOS: 0 ADMISSION DATE:  07/28/2019, CONSULTATION DATE:  07/29/19 REFERRING MD:  Wynetta Emery, [facility], CHIEF COMPLAINT:  Sob   BRIEF HISTORY:    65 yowm quit smoking 9 m PTA with GOLD IV copd documented 10/2017 with progressive doe since quit no better on trelegy and multiple forms of saba > admit 4/4 with no def dx so PCCM consulted am 4/5 am  HISTORY OF PRESENT ILLNESS    pt has h/o MO with aodm/ diastolic dysfunction/ CAF / 02 dep hypercarbic resp failure, MGUS with non-hodgkins  Lymphoma with longstanding doe gradually worse x 1 year and was using cpap but "mask not working" last contacted Dr Annamaria Boots by phone 1/11 /2021and "enducated on communicaton with [place] and DME" but did neither and gradually worse sob since then, wakes up feeling groggy but no am congestion, some increase leg swelling assoc with mod pericaridal effusion which resolved on f/u study 07/25/19 but no better breathing so admitted 07/28/19 and placed on cpap/3lpm has and daytime and feeling some better.  Says walked hallways on 02 since admit and "no one checked his sats"  (extremely poor insight into his care and no note in chart that he had ambulatory study this admit.  Dyspnea:  = MMRC4  = sob if tries to leave home or while getting dressed  / never takes saba prior to exertion, only after and seem to help but not sure it does as always better in 5 min with or without saba in neb or hfa form  Cough: none Sleeping: off cpap prior to admit/ poor quality/ am feels lousy but no HA or am congestion SABA use: way too much  02: 3-3.5 lpm "never turn it above 4.5 even with activity cause "this is what I've been told"    No obvious day to day or daytime variability or assoc excess/ purulent sputum or mucus plugs or hemoptysis or cp or chest tightness, subjective wheeze or overt sinus or hb symptoms.     Also denies any obvious fluctuation of symptoms with  weather or environmental changes or other aggravating or alleviating factors except as outlined above   No unusual exposure hx or h/o childhood pna/ asthma or knowledge of premature birth.  Review of Systems  Constitutional: Negative.   HENT: Negative.   Eyes: Negative.   Respiratory: Positive for shortness of breath.   Cardiovascular: Positive for leg swelling.  Gastrointestinal: Negative.   Genitourinary: Negative.   Musculoskeletal: Negative.   Skin: Negative.   Neurological: Negative.   Endo/Heme/Allergies: Negative.   Psychiatric/Behavioral: Negative.           SIGNIFICANT PAST MEDICAL HISTORY   CAF MO/ AODM MUGUS / non Hodgins Lymphoma     STUDIES:   Echo 4/5 >>> Alpha one AT 4/5 >>>   CULTURES:  BC x 2  4/4 >>>   ANTIBIOTICS:  none    CONSULTANTS:  PCCM 4/5  RVP  4/4 > neg   Scheduled Meds: . apixaban  5 mg Oral BID  . fluticasone furoate-vilanterol  1 puff Inhalation Daily  . insulin aspart  0-5 Units Subcutaneous QHS  . insulin aspart  0-9 Units Subcutaneous TID WC  . insulin aspart  10 Units Subcutaneous TID WC  . metoprolol tartrate  100 mg Oral BID  . PARoxetine  20 mg Oral Daily  . umeclidinium bromide  1 puff Inhalation Daily   Continuous  Infusions: PRN Meds:.acetaminophen **OR** acetaminophen, ipratropium-albuterol, ondansetron **OR** ondansetron (ZOFRAN) IV, polyethylene glycol   SUBJECTIVE:  Slowed mentation/ poor historian/ nad at rest   CONSTITUTIONAL: BP (!) 101/57 (BP Location: Left Arm)   Pulse 71   Temp 98.8 F (37.1 C)   Resp 18   Ht 5\' 9"  (1.753 m)   Wt 135 kg   SpO2 100%   BMI 43.95 kg/m   I/O last 3 completed shifts: In: 2900.2 [IV Piggyback:2900.2] Out: -         PHYSICAL EXAM: General:  Obese elderly wm nad / sats 100% on 3lpm NP  Neuro:  No focal def  Cardiovascular:  IRIR around 80  Lungs:  Distant bs s audible wheeze  Abdomen:  Obese/ sofe  Musculoskeletal:  Warm s calf tenderness / edema/  clubbing Skin:  No rash or skin breakdown    pCXR  4/5 Right base atelectasis with small right pleural effusion. No airspace consolidation. Stable cardiac prominence.   RESOLVED PROBLEM LIST     ASSESSMENT AND PLAN     1)  Acute on chronic hypoxemic/hypercarbic  respiratory failure ? Etiology   DDX of  difficult airways management almost all start with A and  include Adherence, Ace Inhibitors, Acid Reflux, Active Sinus Disease, Alpha 1 Antitripsin deficiency, Anxiety masquerading as Airways dz,  ABPA,  Allergy(esp in young), Aspiration (esp in elderly), Adverse effects of meds,  Active smoking or vaping, A bunch of PE's (a small clot burden can't cause this syndrome unless there is already severe underlying pulm or vascular dz with poor reserve) plus two Bs  = Bronchiectasis and Beta blocker use..and one C= CHF   Adherence is always the initial "prime suspect" and is a multilayered concern that requires a "trust but verify" approach in every patient - starting with knowing how to use medications, especially inhalers, correctly, keeping up with refills and understanding the fundamental difference between maintenance and prns vs those medications only taken for a very short course and then stopped and not refilled.  - return to office with all meds in hand using a trust but verify approach to confirm accurate Medication  Reconciliation The principal here is that until we are certain that the  patients are doing what we've asked, it makes no sense to ask them to do more.   ? Allergy/ asthma > no cough or wheeze on exam  makes this less likely  > rx max nebs but over the long haul needs to work toward a lot less saba dep   ? Anxiety/ depression/ deconditioning/ wt gain since stopped smoking >  usually at the bottom of this list of usual suspects but should be much higher on this pt's based on H and P and note already on psychotropics and may interfere with adherence and also interpretation of  response or lack thereof to symptom management which can be quite subjective.   ? Adverse effects of DPI > try Pulmicort/ brovana/ yupelri if available   ? Alpha one AT def > send phenotype   ? Beta blocker effects: In the setting of respiratory symptoms of unknown etiology,  It would be preferable to use  bisoprolol the most selective generic choice  on the market, at least on a trial basis, to make sure the spillover Beta 2 effects of the less specific Beta blockers are not contributing to this patient's symptoms.   ? CHF >  has diastolic dysfunction with CAF and improving pericardial dz  2) Longstanding OSA/ now ? OHS component and may need bipap or NIV (trilogy) but first needs optimize use of CPAP - says present mask helping so suggested take it home then see Dr Annamaria Boots or one of our sleep med docs at the [place] office if prefers/ availability for f/u is better (opens next week)     3) COPD GOLD IV s/p smoking cessation 9 m pta  - see rx above - would have expected this problem to stabilize off cigs though clearly very advanced.      LABS  Glucose Recent Labs  Lab 07/28/19 1218 07/28/19 1344 07/28/19 1850 07/28/19 2159 07/29/19 0806 07/29/19 1143  GLUCAP 49* 84 88 195* 128* 277*    BMET Recent Labs  Lab 07/28/19 1227 07/29/19 0431  NA 140 140  K 3.5 4.3  CL 94* 101  CO2 34* 32  BUN 33* 27*  CREATININE 1.34* 1.17  GLUCOSE 47* 129*    Liver Enzymes Recent Labs  Lab 07/28/19 1227  AST 28  ALT 17  ALKPHOS 90  BILITOT 0.7  ALBUMIN 3.8    Electrolytes Recent Labs  Lab 07/28/19 1227 07/28/19 1242 07/29/19 0431  CALCIUM 9.8  --  9.0  MG  --  1.9  --     CBC Recent Labs  Lab 07/28/19 1227  WBC 8.3  HGB 13.3  HCT 42.5  PLT 288    ABG No results for input(s): PHART, PCO2ART, PO2ART in the last 168 hours.  Coag's Recent Labs  Lab 07/28/19 1227  APTT 31  INR 1.4*    Sepsis Markers Recent Labs  Lab 07/28/19 1241  LATICACIDVEN 0.9     Cardiac Enzymes No results for input(s): TROPONINI, PROBNP in the last 168 hours.  PAST MEDICAL HISTORY :   He  has a past medical history of Acute diastolic heart failure ([place]) (03/28/2016), Acute encephalopathy (03/28/2016), Acute encephalopathy (03/28/2016), Acute on chronic respiratory failure with hypoxia ([place]) (12/04/2016), Arthritis, Asthma, Atrial fibrillation ([place]), Benign neoplasm of ascending colon (05/05/2014), BPH (benign prostatic hypertrophy), CARDIOVASCULAR STUDIES, ABNORMAL (04/27/2009), Chronic anticoagulation (08/20/2012), Chronic respiratory failure with hypoxia ([place]) (11/10/2017), Colon polyps, COPD mixed type ([place]) (03/28/2016), Diabetes mellitus, Diabetic neuropathy, type II diabetes mellitus ([place]) (09/25/2014), Diabetic retinopathy ([place]) (05/28/2017), 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  PAST SURGICAL HISTORY:  He  has a past surgical history that includes Appendectomy and Colonoscopy with propofol (N/A, 05/05/2014).  Allergies  Allergen Reactions  . Avapro [Irbesartan] Other (See Comments)    "doesnt sit right with me"--light headed  . Lipitor [Atorvastatin Calcium] Other (See Comments)    Leg pain    No current facility-administered medications on file prior to encounter.   Current Outpatient Medications on File Prior to Encounter  Medication Sig  . acetaminophen (TYLENOL) 325 MG tablet Take 650 mg by mouth every  6 (six) hours as needed.  Marland Kitchen albuterol (VENTOLIN HFA) 108 (90 Base) MCG/ACT inhaler Inhale 2 puffs into the lungs every 6 (six) hours as needed for wheezing or shortness of breath.  . Cholecalciferol (VITAMIN D3) 5000 UNITS TABS Take 1 tablet by mouth every morning.  Marland Kitchen ELIQUIS  5 MG TABS tablet Take 1 tablet by mouth twice daily  . furosemide (LASIX) 40 MG tablet Take 1-2 tablets (40-80 mg total) by mouth 2 (two) times daily. Patient takes 80mg  in the morning and 40mg  in the evening (Patient taking differently: Take 40 mg by mouth 2 (two) times daily. Patient takes 40mg  in the morning and 40mg  in the evenin)  . insulin NPH Human (NOVOLIN N RELION) 100 UNIT/ML injection Inject 0.5-0.55 mLs (50-55 Units total) into the skin 2 (two) times daily before a meal.  . insulin regular (NOVOLIN R) 100 units/mL injection Inject 15 units before breakfast and supper (Patient taking differently: Inject 12-15 Units into the skin 2 (two) times daily before a meal. Inject 15 units before breakfast and supper)  . ipratropium-albuterol (DUONEB) 0.5-2.5 (3) MG/3ML SOLN Take 3 mLs by nebulization every 6 (six) hours as needed.  . metoprolol tartrate (LOPRESSOR) 100 MG tablet Take 1 tablet by mouth twice daily  . nystatin (MYCOSTATIN/NYSTOP) powder Apply topically 2 (two) times daily. Apply to groin area and scrotum skin; keep area clean and dry.  Marland Kitchen PARoxetine (PAXIL) 20 MG tablet Take 1 tablet by mouth once daily  . Semaglutide, 1 MG/DOSE, (OZEMPIC, 1 MG/DOSE,) 2 MG/1.5ML SOPN Inject 1 mg into the skin once a week.  . simvastatin (ZOCOR) 40 MG tablet Take 1 tablet by mouth once daily  . TRELEGY ELLIPTA 100-62.5-25 MCG/INH AEPB Take 1 puff by mouth daily.  Marland Kitchen oxymetazoline (AFRIN) 0.05 % nasal spray Place 1 spray into both nostrils 2 (two) times daily as needed for congestion.    FAMILY HISTORY:   His family history includes Aortic aneurysm in his sister; COPD in his sister and sister; Colon cancer in his father; Colon  polyps in his father; Diabetes in his maternal grandmother, mother, and sister; Heart attack in his sister; Heart disease in his mother, sister, and sister; Liver cancer in his mother; Prostate cancer in his father; Pulmonary embolism in his sister.  SOCIAL HISTORY:  He  reports that he quit smoking about 9 months ago. His smoking use included cigarettes. He started smoking about 50 years ago. He has a 40.00 pack-year smoking history. He has never used smokeless tobacco. He reports that he does not drink alcohol or use drugs.    Christinia Gully, MD Pulmonary and [place] [phone] After 6:00 PM or weekends, use [place] [phone]  After 7:00 pm call [place]  [phone]

## 2019-07-29 NOTE — Patient Outreach (Addendum)
[facility]) Care Management  07/29/2019  Justin Ruiz 24-May-1950 [id number]   Received an inbox message from [place], [name] stating the patient's PCP approved the change from [facility] to Symbicort due to [facility] no longer being available through [facility] Patient Assistance Program.  However, after review of the patient's chart, he is now using Trelegy.  Spoke with patient's wife, Justin Ruiz).  She confirmed the patient has been using Trelegy.  Trelegy is available through [employer] Patient Assistance Program but requires patients to spend at least $600 in out-of-pocket medication expenses.  Patient's wife said she paid $140 for Trelegy most recently but expressed concern about what the price will be when the patient hits the donut hole.  Plan: Send note back to [name], RN Patient's wife said she would check back in when the patient gets discharged.      [name], PharmD, [place] Clinical Pharmacist [phone]

## 2019-07-29 NOTE — Care Management Obs Status (Signed)
[facility] NOTIFICATION   Patient Details  Name: Justin Ruiz MRN: [id number] Date of Birth: [birth year]   Medicare Observation Status Notification Given:  Yes    Tommy Medal 07/29/2019, 1:08 PM

## 2019-07-29 NOTE — Telephone Encounter (Signed)
I wrote the scrip. Please send to [facility]. Thanks, WS

## 2019-07-29 NOTE — Chronic Care Management (AMB) (Signed)
Chronic Care Management   Care Coordination Note   07/29/2019 Name: Justin LONSWAY MRN: [id number] DOB: 26-Sep-1950  Referred by: [name], MD Reason for referral : Chronic Care Management (care coordination)   Justin Ruiz is a 69 y.o. year old male who is a primary care patient of Stacks, [name], MD. The CCM team was consulted for assistance with chronic disease management and care coordination needs.    Review of patient status, including review of consultants reports, relevant laboratory and other test results, and collaboration with appropriate care team members and the patient's provider was performed as part of comprehensive patient evaluation and provision of chronic care management services.    SDOH (Social Determinants of Health) assessments performed: No See Care Plan activities for detailed interventions related to [place])     Facility-Administered Encounter Medications as of 07/29/2019  Medication  . acetaminophen (TYLENOL) tablet 650 mg   Or  . acetaminophen (TYLENOL) suppository 650 mg  . apixaban (ELIQUIS) tablet 5 mg  . fluticasone furoate-vilanterol (BREO ELLIPTA) 100-25 MCG/INH 1 puff  . insulin aspart (novoLOG) injection 0-5 Units  . insulin aspart (novoLOG) injection 0-9 Units  . insulin aspart (novoLOG) injection 10 Units  . ipratropium-albuterol (DUONEB) 0.5-2.5 (3) MG/3ML nebulizer solution 3 mL  . metoprolol tartrate (LOPRESSOR) tablet 100 mg  . ondansetron (ZOFRAN) tablet 4 mg   Or  . ondansetron (ZOFRAN) injection 4 mg  . PARoxetine (PAXIL) tablet 20 mg  . polyethylene glycol (MIRALAX / GLYCOLAX) packet 17 g  . umeclidinium bromide (INCRUSE ELLIPTA) 62.5 MCG/INH 1 puff  . [DISCONTINUED] albuterol (PROVENTIL) (2.5 MG/3ML) 0.083% nebulizer solution 2.5 mg   Outpatient Encounter Medications as of 07/29/2019  Medication Sig Note  . acetaminophen (TYLENOL) 325 MG tablet Take 650 mg by mouth every 6 (six) hours as needed.   Marland Kitchen albuterol (VENTOLIN HFA) 108  (90 Base) MCG/ACT inhaler Inhale 2 puffs into the lungs every 6 (six) hours as needed for wheezing or shortness of breath.   . budesonide-formoterol (SYMBICORT) 160-4.5 MCG/ACT inhaler Inhale 2 puffs into the lungs 2 (two) times daily.   . Cholecalciferol (VITAMIN D3) 5000 UNITS TABS Take 1 tablet by mouth every morning.   Marland Kitchen ELIQUIS 5 MG TABS tablet Take 1 tablet by mouth twice daily   . furosemide (LASIX) 40 MG tablet Take 1-2 tablets (40-80 mg total) by mouth 2 (two) times daily. Patient takes 80mg  in the morning and 40mg  in the evening (Patient taking differently: Take 40 mg by mouth 2 (two) times daily. Patient takes 40mg  in the morning and 40mg  in the evenin)   . insulin NPH Human (NOVOLIN N RELION) 100 UNIT/ML injection Inject 0.5-0.55 mLs (50-55 Units total) into the skin 2 (two) times daily before a meal.   . insulin regular (NOVOLIN R) 100 units/mL injection Inject 15 units before breakfast and supper (Patient taking differently: Inject 12-15 Units into the skin 2 (two) times daily before a meal. Inject 15 units before breakfast and supper)   . ipratropium-albuterol (DUONEB) 0.5-2.5 (3) MG/3ML SOLN Take 3 mLs by nebulization every 6 (six) hours as needed.   . metoprolol tartrate (LOPRESSOR) 100 MG tablet Take 1 tablet by mouth twice daily   . nystatin (MYCOSTATIN/NYSTOP) powder Apply topically 2 (two) times daily. Apply to groin area and scrotum skin; keep area clean and dry.   Marland Kitchen oxymetazoline (AFRIN) 0.05 % nasal spray Place 1 spray into both nostrils 2 (two) times daily as needed for congestion. 07/28/2019: On hand if needed  .  PARoxetine (PAXIL) 20 MG tablet Take 1 tablet by mouth once daily   . Semaglutide, 1 MG/DOSE, (OZEMPIC, 1 MG/DOSE,) 2 MG/1.5ML SOPN Inject 1 mg into the skin once a week.   . simvastatin (ZOCOR) 40 MG tablet Take 1 tablet by mouth once daily   . TRELEGY ELLIPTA 100-62.5-25 MCG/INH AEPB Take 1 puff by mouth daily.      Objective:   RN Care Plan   . COPD Medication  Management (pt-stated)       Current Barriers:  . Film/video editor.  . Chronic Disease Management support and education needs related to COPD  Nurse Case Manager Clinical Goal(s):  Marland Kitchen Over the next 30 days, patient will work with Consulting civil engineer to obtain affordable COPD management medications  Interventions:  . Consulted by [facility] Pharmacist Manson Passey is no longer covered through prescription assistance but Symbicort is . Consulted with Dr Livia Snellen regarding switch to Symbicort 160/4.5 2 puffs BID. Dr Livia Snellen was agreeable.  Marland Kitchen Updated patient medication list to include Symbicort . Updated Alwyn Ren on new order for Symbicort o She will send out prescription assistance forms to patient and Dr Livia Snellen   Patient Self Care Activities:  . Self administers medications as prescribed . Performs ADL's independently . Has help from his wife for medical management  Please see past updates related to this goal by clicking on the "Past Updates" button in the selected goal          Plan:   The care management team will reach out to the patient again over the next 30 days.    [name], BSN, RN-BC Embedded Chronic Care Manager [facility] [place] Management Direct Dial: [phone]

## 2019-07-29 NOTE — Telephone Encounter (Signed)
Order and notes faxed to [name]

## 2019-07-29 NOTE — Progress Notes (Signed)
Patient asleep not wearing CPAP

## 2019-07-29 NOTE — Patient Instructions (Signed)
Visit Information  Goals Addressed            This Visit's Progress     Patient Stated   . COPD Medication Management (pt-stated)       Current Barriers:  . Film/video editor.  . Chronic Disease Management support and education needs related to COPD  Nurse Case Manager Clinical Goal(s):  Marland Kitchen Over the next 30 days, patient will work with Consulting civil engineer to obtain affordable COPD management medications  Interventions:  . Consulted by [facility] Pharmacist Manson Passey is no longer covered through prescription assistance but Symbicort is . Consulted with Dr Livia Snellen regarding switch to Symbicort 160/4.5 2 puffs BID. Dr Livia Snellen was agreeable.  Marland Kitchen Updated patient medication list to include Symbicort . Updated Alwyn Ren on new order for Symbicort o She will send out prescription assistance forms to patient and Dr Livia Snellen   Patient Self Care Activities:  . Self administers medications as prescribed . Performs ADL's independently . Has help from his wife for medical management  Please see past updates related to this goal by clicking on the "Past Updates" button in the selected goal         Follow-up Plan The care management team will reach out to the patient again over the next 30 days.   [name], BSN, RN-BC Embedded Chronic Care Manager [facility] / [place] Management Direct Dial: [phone]

## 2019-07-30 DIAGNOSIS — J9602 Acute respiratory failure with hypercapnia: Secondary | ICD-10-CM

## 2019-07-30 LAB — CBC
HCT: 35.8 % — ABNORMAL LOW (ref 39.0–52.0)
Hemoglobin: 10.9 g/dL — ABNORMAL LOW (ref 13.0–17.0)
MCH: 28.8 pg (ref 26.0–34.0)
MCHC: 30.4 g/dL (ref 30.0–36.0)
MCV: 94.5 fL (ref 80.0–100.0)
Platelets: 192 10*3/uL (ref 150–400)
RBC: 3.79 MIL/uL — ABNORMAL LOW (ref 4.22–5.81)
RDW: 13.5 % (ref 11.5–15.5)
WBC: 6.3 10*3/uL (ref 4.0–10.5)
nRBC: 0 % (ref 0.0–0.2)

## 2019-07-30 LAB — BASIC METABOLIC PANEL
Anion gap: 7 (ref 5–15)
BUN: 22 mg/dL (ref 8–23)
CO2: 32 mmol/L (ref 22–32)
Calcium: 9.1 mg/dL (ref 8.9–10.3)
Chloride: 100 mmol/L (ref 98–111)
Creatinine, Ser: 1.02 mg/dL (ref 0.61–1.24)
GFR calc Af Amer: >60 mL/min (ref >60)
GFR calc non Af Amer: >60 mL/min (ref >60)
Glucose, Bld: 168 mg/dL — ABNORMAL HIGH (ref 70–99)
Potassium: 4.9 mmol/L (ref 3.5–5.1)
Sodium: 139 mmol/L (ref 135–145)

## 2019-07-30 LAB — URINE CULTURE: Culture: <10000 — AB

## 2019-07-30 LAB — GLUCOSE, CAPILLARY
Glucose-Capillary: 142 mg/dL — ABNORMAL HIGH (ref 70–99)
Glucose-Capillary: 164 mg/dL — ABNORMAL HIGH (ref 70–99)
Glucose-Capillary: 269 mg/dL — ABNORMAL HIGH (ref 70–99)

## 2019-07-30 LAB — MAGNESIUM: Magnesium: 1.8 mg/dL (ref 1.7–2.4)

## 2019-07-30 MED ORDER — INSULIN REGULAR HUMAN 100 UNIT/ML IJ SOLN: 8.0000 [IU] | Freq: Three times a day (TID) | INTRAMUSCULAR | 5 refills | Status: DC

## 2019-07-30 MED ORDER — ALBUTEROL SULFATE HFA 108 (90 BASE) MCG/ACT IN AERS: 2.0000 | INHALATION_SPRAY | RESPIRATORY_TRACT | Status: DC | PRN

## 2019-07-30 MED ORDER — BISOPROLOL FUMARATE 5 MG PO TABS: 5.0000 mg | ORAL_TABLET | Freq: Two times a day (BID) | ORAL | 0 refills | Status: DC

## 2019-07-30 NOTE — Discharge Summary (Addendum)
Physician Discharge Summary  MAVERIK BUETI [id number] DOB: Sep 25, 1950 DOA: 07/28/2019  PCP: [name], MD Pulmonologist: Dr. Glynn Octave Cardiologist: Dr. Percival Spanish  Admit date: 07/28/2019 Discharge date: 07/30/2019  Admitted From:  Home  Disposition: Home PT, RN, SW, Aide  Recommendations for Outpatient Follow-up:  1. Follow up with PCP in 1 weeks 2. Follow up with pulmonologist in 1-2 weeks 3. Follow up with cardiologist as scheduled.  4. Discuss with pulmonologist about getting a prescription for revefenacin Maretta Bees) nebulizer solution  Home Health: Declined   Discharge Condition: STABLE   CODE STATUS: FULL    Brief Hospitalization Summary: Please see all hospital notes, images, labs for full details of the hospitalization. ADMISSION HPI: Justin Ruiz is a 69 y.o. male with medical history significant for diastolic CHF, chronic respiratory failure, MGUS, non-Hodgkin's lymphoma atrial fibrillation with RVR, morbid obesity, diabetes mellitus. Patient presented to the ED with complaints of difficulty with breathing, progressive over the past month.  Reports at rest he is okay.  But with short ambulation he gets short of breath.  He reports a dry cough over the past 2 days.  No fevers, no chills.  No chest pain.  He denies swelling of his lower extremities. He reports compliance with his Eliquis and Lasix every day.  He denies bloating or weight gain.  At baseline patient is on 3 L of oxygen, but he reports he has had to gradually increase it to 4-1/2 L over the past month.  Yesterday patient was feeling weak, blood sugar was 24, EMS was called, but patient declined transfer to hospital.  This morning his blood sugar was checked and it was 77.  He still received his insulins, but at reduced dose.  One of them was 12 units, ?  NPH and NovoLog.   He denies recent adjustments of his insulin doses.  Reports good oral intake.  Most recent hospitalization-3/15-3/17- also presented with  difficulty breathing, CTA chest negative for PE showed small pleural effusions and a large pericardial effusion.  Subsequent echocardiogram 3/16 -moderate-sized pericardial effusion without tamponade.  Recommendation was for repeat echocardiogram in 2 to 3 weeks.  He was discharged home on 3 L. Follow-up limited echocardiogram 4/1 -resolved pericardial effusion.  Hospitalization-3/2-3/5-for acute on chronic diastolic CHF with acute on chronic respiratory failure.  ED Course: Blood pressure systolic [id number], O2 sats greater than 97% on 2 - 3 L O2.  Blood sugar 47.  Potassium 3.5.  WBC 8.3.  UA with trace leukocytes.  BNP 243.  Normal lactic acid 0.9.  Portable chest x-ray shows right base atelectasis with small right pleural effusion.  No airspace consolidation.  With concern for sepsis initial hypotension and tachypnea, code sepsis was initiated, IV Vanco and cefepime given with 2.2 L bolus sepsis fluids given.  D50 given with blood pressure improving to 84. Hospitalist to admit for further evaluation and management.  Patient was ambulated on 3 L O2, initial sats 92%, dropped to 88%, patient became tachypneic.  Brief Admission Hx: 68 y.o.malewith medical history significant fordiastolic CHF,chronic respiratory failure,MGUS, non-Hodgkin's lymphoma atrial fibrillation with RVR, morbid obesity, diabetes mellitus.  Patient presented to the EDwithcomplaints of difficulty with breathing,progressive over the past month. Reports at rest he is okay. But with short ambulation he gets short of breath. He reports a dry cough over the past 2 days. No fevers,no chills. No chest pain. He denies swelling of his lower extremities. He reports compliance with his Eliquis and Lasix every day. He denies bloating  or weight gain.  At baseline patient is on 3 L of oxygen, but he reports he has had to gradually increase it to 4-1/2 L over the past month.   He had a recent hospitalization for a moderately sized  pericardial effusion but subsequent Echo shows that it had resolved.   MDM/Assessment & Plan:   1. Acute on chronic respiratory failure with hypoxia-I suspect this is likely multifactorial.  He has had higher oxygen requirement than when he was discharged last month.  We are doing a new dyspnea work-up on him now.  He was seen by pulmonologist - see consult notes.  He was started on bisoprolol 5 mg BID and revefenacin nebulizer treatment.  His new baseline oxygen requirement is 4L / min.  He seems to do much better on this.   His echocardiogram showed no recurrence of pericardial effusion.   He had atelectatic changes on chest x-ray and we encouraged some incentive spirometry and ambulation.  he is feeling better today and wants to go home.  He declines Home Health.  He is followed by Dr. Keturah Barre outpatient and I have asked him to follow up.  Did not prescribe revefenacin due to hard stops saying it is not covered under his insurance plan.  I asked him to see his pulmonologist Dr. Annamaria Boots to discuss this.  Pt stable to discharge home with close outpatient follow up.  We were unable to convince him to accept some home health services.   2. Type 2 diabetes mellitus with hyperglycemia-he presented with a severe hypoglycemia on admission.  He has hypoglycemia unawareness.  I have discontinued NPH as he has also started ozempic.  His regular mealtime coverage was reduced to 8 units TID with meals.  He was strongly advised to monitor BS closely and speak with his PCp about getting a CGM to help avoid severe hypoglycemia which he must be having frequently given his degree of hypoglycemic unawareness. 3. Chronic atrial fibrillation-he was started on bisoprolol 5 mg BID by pulmonologist and he is fully anticoagulated on apixaban. 4. GAD-he has been started on paroxetine which will be continued. 5. Essential hypertension-presented with some soft pressures that have recovered.  Restart home Lasix.  6. COPD-we have  resumed all of his home bronchodilators.  He does not seem to be in acute exacerbation at this time.  He was seen for an inpatient pulmonary consultation. 7. Non-Hodgkin's lymphoma/MGUS-she is followed outpatient by Dr. Earlie Server 8. OSA - on CPAP, he wore CPAP last night and feels much better today.  He was strongly advised to please wear CPAP every night.   DVT prophylaxis: Apixaban Code Status: Full Family Communication: Wife  Disposition Plan: Home with HH PT, RN, Aide, SW  Consultants:  Pulmonary Dr. Melvyn Novas  Procedures:  LIMITED 2D echocardiogram IMPRESSIONS  1. Left ventricular ejection fraction, by estimation, is 60 to 65%. The  left ventricle has normal function. The left ventricle has no regional  wall motion abnormalities.  2. Right ventricular systolic function is normal. The right ventricular  size is mildly enlarged.  3. The inferior vena cava is normal in size with greater than 50%  respiratory variability, suggesting right atrial pressure of 3 mmHg.  4. Limited echo   FINDINGS  Left Ventricle: Left ventricular ejection fraction, by estimation, is 60  to 65%. The left ventricle has normal function. The left ventricle has no  regional wall motion abnormalities. The left ventricular internal cavity  size was normal in size. There is  no left ventricular hypertrophy.   Right Ventricle: The right ventricular size is mildly enlarged. Right  ventricular systolic function is normal.   Pericardium: There is no evidence of pericardial effusion.   Venous: The inferior vena cava is normal in size with greater than 50%  respiratory variability, suggesting right atrial pressure of 3 mmHg.   Carlyle Dolly MD  Electronically signed by Carlyle Dolly MD  Signature Date/Time: 07/29/2019/3:56:49 PM    Antimicrobials:  n/a  Discharge Diagnoses:  Principal Problem:   Dyspnea Active Problems:   Diabetes mellitus type 2, insulin dependent ([place])   Hypertension    Generalized anxiety disorder   Chronic diastolic heart failure (HCC)   Chronic diastolic CHF (congestive heart failure) (HCC)   COPD mixed type (HCC)   Atrial fibrillation, chronic (HCC)   Acute respiratory failure with hypoxia and hypercapnia ([place])   Discharge Instructions:  Allergies as of 07/30/2019      Reactions   Avapro [irbesartan] Other (See Comments)   "doesnt sit right with me"--light headed   Lipitor [atorvastatin Calcium] Other (See Comments)   Leg pain      Medication List    STOP taking these medications   insulin NPH Human 100 UNIT/ML injection Commonly known as: NovoLIN N ReliOn   metoprolol tartrate 100 MG tablet Commonly known as: LOPRESSOR   Trelegy Ellipta 100-62.5-25 MCG/INH Aepb Generic drug: Fluticasone-Umeclidin-Vilant     TAKE these medications   acetaminophen 325 MG tablet Commonly known as: TYLENOL Take 650 mg by mouth every 6 (six) hours as needed.   albuterol 108 (90 Base) MCG/ACT inhaler Commonly known as: VENTOLIN HFA Inhale 2 puffs into the lungs every 4 (four) hours as needed for wheezing or shortness of breath. What changed: when to take this   bisoprolol 5 MG tablet Commonly known as: ZEBETA Take 1 tablet (5 mg total) by mouth 2 (two) times daily.   budesonide-formoterol 160-4.5 MCG/ACT inhaler Commonly known as: SYMBICORT Inhale 2 puffs into the lungs 2 (two) times daily.   Eliquis 5 MG Tabs tablet Generic drug: apixaban Take 1 tablet by mouth twice daily   furosemide 40 MG tablet Commonly known as: LASIX Take 1-2 tablets (40-80 mg total) by mouth 2 (two) times daily. Patient takes 80mg  in the morning and 40mg  in the evening What changed:   how much to take  additional instructions   insulin regular 100 units/mL injection Commonly known as: NOVOLIN R Inject 0.08 mLs (8 Units total) into the skin 3 (three) times daily before meals. What changed:   how much to take  how to take this  when to take this  additional  instructions   ipratropium-albuterol 0.5-2.5 (3) MG/3ML Soln Commonly known as: DUONEB Take 3 mLs by nebulization every 6 (six) hours as needed.   nystatin powder Commonly known as: MYCOSTATIN/NYSTOP Apply topically 2 (two) times daily. Apply to groin area and scrotum skin; keep area clean and dry.   oxymetazoline 0.05 % nasal spray Commonly known as: AFRIN Place 1 spray into both nostrils 2 (two) times daily as needed for congestion.   Ozempic (1 MG/DOSE) 2 MG/1.5ML Sopn Generic drug: Semaglutide (1 MG/DOSE) Inject 1 mg into the skin once a week.   PARoxetine 20 MG tablet Commonly known as: PAXIL Take 1 tablet by mouth once daily   simvastatin 40 MG tablet Commonly known as: ZOCOR Take 1 tablet by mouth once daily   Vitamin D3 125 MCG (5000 UT) Tabs Take 1 tablet by mouth every morning.  Durable Medical Equipment  (From admission, onward)         Start     Ordered   07/30/19 1049  For home use only DME oxygen  Once    Question Answer Comment  Length of Need Lifetime   Mode or (Route) Nasal cannula   Liters per Minute 4   Frequency Continuous (stationary and portable oxygen unit needed)   Oxygen conserving device Yes   Oxygen delivery system Gas      07/30/19 1049         Follow-up Information    Stacks, [name], MD. Schedule an appointment as soon as possible for a visit in 1 week(s).   Specialty: Family Medicine Contact information: [place] [address] [phone]        Minus Breeding, MD. Schedule an appointment as soon as possible for a visit in 2 week(s).   Specialty: Cardiology Contact information: [address] [place] [address] [phone]        Baird Lyons D, MD. Schedule an appointment as soon as possible for a visit in 1 week(s).   Specialty: Pulmonary Disease Contact information: [place] [address] [phone]          Allergies  Allergen  Reactions  . Avapro [Irbesartan] Other (See Comments)    "doesnt sit right with me"--light headed  . Lipitor [Atorvastatin Calcium] Other (See Comments)    Leg pain   Allergies as of 07/30/2019      Reactions   Avapro [irbesartan] Other (See Comments)   "doesnt sit right with me"--light headed   Lipitor [atorvastatin Calcium] Other (See Comments)   Leg pain      Medication List    STOP taking these medications   insulin NPH Human 100 UNIT/ML injection Commonly known as: NovoLIN N ReliOn   metoprolol tartrate 100 MG tablet Commonly known as: LOPRESSOR   Trelegy Ellipta 100-62.5-25 MCG/INH Aepb Generic drug: Fluticasone-Umeclidin-Vilant     TAKE these medications   acetaminophen 325 MG tablet Commonly known as: TYLENOL Take 650 mg by mouth every 6 (six) hours as needed.   albuterol 108 (90 Base) MCG/ACT inhaler Commonly known as: VENTOLIN HFA Inhale 2 puffs into the lungs every 4 (four) hours as needed for wheezing or shortness of breath. What changed: when to take this   bisoprolol 5 MG tablet Commonly known as: ZEBETA Take 1 tablet (5 mg total) by mouth 2 (two) times daily.   budesonide-formoterol 160-4.5 MCG/ACT inhaler Commonly known as: SYMBICORT Inhale 2 puffs into the lungs 2 (two) times daily.   Eliquis 5 MG Tabs tablet Generic drug: apixaban Take 1 tablet by mouth twice daily   furosemide 40 MG tablet Commonly known as: LASIX Take 1-2 tablets (40-80 mg total) by mouth 2 (two) times daily. Patient takes 80mg  in the morning and 40mg  in the evening What changed:   how much to take  additional instructions   insulin regular 100 units/mL injection Commonly known as: NOVOLIN R Inject 0.08 mLs (8 Units total) into the skin 3 (three) times daily before meals. What changed:   how much to take  how to take this  when to take this  additional instructions   ipratropium-albuterol 0.5-2.5 (3) MG/3ML Soln Commonly known as: DUONEB Take 3 mLs by  nebulization every 6 (six) hours as needed.   nystatin powder Commonly known as: MYCOSTATIN/NYSTOP Apply topically 2 (two) times daily. Apply to groin area and scrotum  skin; keep area clean and dry.   oxymetazoline 0.05 % nasal spray Commonly known as: AFRIN Place 1 spray into both nostrils 2 (two) times daily as needed for congestion.   Ozempic (1 MG/DOSE) 2 MG/1.5ML Sopn Generic drug: Semaglutide (1 MG/DOSE) Inject 1 mg into the skin once a week.   PARoxetine 20 MG tablet Commonly known as: PAXIL Take 1 tablet by mouth once daily   simvastatin 40 MG tablet Commonly known as: ZOCOR Take 1 tablet by mouth once daily   Vitamin D3 125 MCG (5000 UT) Tabs Take 1 tablet by mouth every morning.            Durable Medical Equipment  (From admission, onward)         Start     Ordered   07/30/19 1049  For home use only DME oxygen  Once    Question Answer Comment  Length of Need Lifetime   Mode or (Route) Nasal cannula   Liters per Minute 4   Frequency Continuous (stationary and portable oxygen unit needed)   Oxygen conserving device Yes   Oxygen delivery system Gas      07/30/19 1049          Procedures/Studies: DG Chest 2 View  Result Date: 07/04/2019 CLINICAL DATA:  Dyspnea. EXAM: CHEST - 2 VIEW COMPARISON:  June 25, 2018 FINDINGS: There is stable upper limits of normal heart size with prominent pericardial fat pad extending into the bilateral costophrenic angles. Inspiration is shallow. There is chronic centrilobular emphysema. Mild bilateral infrahilar bronchitis projects on the cardiophrenic angles. There is no pulmonary edema, obvious pleural effusion, pneumothorax or pneumonia. Moderate skeletal degenerative changes and aortic knob calcified atherosclerosis are present. IMPRESSION: Mild centrilobular emphysema with infrahilar bronchitis. Prominent pericardial fat pad, which degrades evaluation for pleural fluid. No pneumonia or pulmonary edema. Electronically  Signed   By: Revonda Humphrey   On: 07/04/2019 23:18   CT Angio Chest PE W/Cm &/Or Wo Cm  Result Date: 07/08/2019 CLINICAL DATA:  Shortness of breath EXAM: CT ANGIOGRAPHY CHEST WITH CONTRAST TECHNIQUE: Multidetector CT imaging of the chest was performed using the standard protocol during bolus administration of intravenous contrast. Multiplanar CT image reconstructions and MIPs were obtained to evaluate the vascular anatomy. CONTRAST:  155mL OMNIPAQUE IOHEXOL 350 MG/ML SOLN COMPARISON:  CTA chest 11/08/2018 FINDINGS: Cardiovascular: Contrast injection is sufficient to demonstrate satisfactory opacification of the pulmonary arteries to the segmental level. There is no pulmonary embolus or evidence of right heart strain. The size of the main pulmonary artery is normal. There is a large pericardial effusion that measures 2.7 cm thickness, new since the prior scan of 11/08/2018. The course and caliber of the aorta are normal. There is mild atherosclerotic calcification. Opacification decreased due to pulmonary arterial phase contrast bolus timing. Mediastinum/Nodes: Multiple subcentimeter mediastinal lymph nodes. Lungs/Pleura: There are small pleural effusions, slightly larger on the right. Bibasilar atelectasis. Airways are patent. Upper Abdomen: Contrast bolus timing is not optimized for evaluation of the abdominal organs. There is cholelithiasis. No acute findings. Musculoskeletal: No chest wall abnormality. No bony spinal canal stenosis. Review of the MIP images confirms the above findings. IMPRESSION: 1. No pulmonary embolus. 2. Large pericardial effusion, new since the prior scan. 3. Small pleural effusions with associated bibasilar atelectasis. Electronically Signed   By: Ulyses Jarred M.D.   On: 07/08/2019 19:18   DG Chest Port 1 View  Result Date: 07/28/2019 CLINICAL DATA:  Shortness of breath EXAM: PORTABLE CHEST 1 VIEW COMPARISON:  July 08, 2019 FINDINGS: There is a small right pleural effusion. There is  mild right base atelectasis. The lungs elsewhere are clear. Heart is mildly enlarged with pulmonary vascularity normal. There is aortic atherosclerosis. No bone lesions. IMPRESSION: Home right base atelectasis with small right pleural effusion. No airspace consolidation. Stable cardiac prominence. Electronically Signed   By: Lowella Grip III M.D.   On: 07/28/2019 12:56   DG Chest Port 1 View  Result Date: 07/08/2019 CLINICAL DATA:  Shortness of breath. EXAM: PORTABLE CHEST 1 VIEW COMPARISON:  July 04, 2019. FINDINGS: Stable cardiomegaly. Left lung base is not entirely included in field-of-view. Bibasilar opacities are noted concerning for atelectasis or infiltrates with possible effusions. No pneumothorax is noted. Bony thorax is unremarkable. IMPRESSION: Bibasilar opacities are noted concerning for atelectasis or infiltrates with possible effusions. Exam is limited as left lung base is not entirely included in field-of-view. Electronically Signed   By: Marijo Conception M.D.   On: 07/08/2019 16:37   ECHOCARDIOGRAM LIMITED  Result Date: 07/29/2019    ECHOCARDIOGRAM LIMITED REPORT   Patient Name:   Justin Ruiz Date of Exam: 07/29/2019 Medical Rec #:  [id number]      Height:       69.0 in Accession #:    [id number]     Weight:       297.6 lb Date of Birth:  1950/06/15      BSA:          2.446 m Patient Age:    21 years       BP:           100/53 mmHg Patient Gender: M              HR:           57 bpm. Exam Location:  [facility] Procedure: 2D Echo and Cardiac Doppler Indications:    Recent pericardial effusion spontaneously resolved. Presenting                 again with increasing dyspnea and hypotension.  History:        Patient has prior history of Echocardiogram examinations, most                 recent 07/25/2019. COPD, Arrythmias:Atrial Fibrillation; Risk                 Factors:Diabetes, Hypertension and Dyslipidemia. Chronic                 diastolic heart failure,Obesity, morbid,Obstructive sleep  apnea,                 h/o Effusion, NHL (non-Hodgkin's lymphoma.  Sonographer:    Alvino Chapel RCS Referring Phys: [phone] [place]  1. Left ventricular ejection fraction, by estimation, is 60 to 65%. The left ventricle has normal function. The left ventricle has no regional wall motion abnormalities.  2. Right ventricular systolic function is normal. The right ventricular size is mildly enlarged.  3. The inferior vena cava is normal in size with greater than 50% respiratory variability, suggesting right atrial pressure of 3 mmHg.  4. Limited echo FINDINGS  Left Ventricle: Left ventricular ejection fraction, by estimation, is 60 to 65%. The left ventricle has normal function. The left ventricle has no regional wall motion abnormalities. The left ventricular internal cavity size was normal in size. There is  no left ventricular hypertrophy. Right Ventricle: The right ventricular size is mildly enlarged. Right ventricular systolic  function is normal. Pericardium: There is no evidence of pericardial effusion. Venous: The inferior vena cava is normal in size with greater than 50% respiratory variability, suggesting right atrial pressure of 3 mmHg. Carlyle Dolly MD Electronically signed by Carlyle Dolly MD Signature Date/Time: 07/29/2019/3:56:49 PM    Final    ECHOCARDIOGRAM LIMITED  Result Date: 07/25/2019    ECHOCARDIOGRAM LIMITED REPORT   Patient Name:   Justin Ruiz Date of Exam: 07/25/2019 Medical Rec #:  [id number]      Height:       69.0 in Accession #:    [id number]     Weight:       298.9 lb Date of Birth:  November 05, 1950      BSA:          2.451 m Patient Age:    82 years       BP:           96/63 mmHg Patient Gender: M              HR:           91 bpm. Exam Location:  [facility] Procedure: Limited Echo, Cardiac Doppler and Limited Color Doppler Indications:    I31.3 (ICD-10-CM) - Pericardial effusion follow up  History:        Patient has prior history of Echocardiogram examinations,  most                 recent 07/09/2019.  Sonographer:    Alvino Chapel RCS Referring Phys: [id number] [place]  1. Left ventricular ejection fraction, by estimation, is 60 to 65%. The left ventricle has normal function. The left ventricle has no regional wall motion abnormalities.  2. The right ventricular size is mildly enlarged.  3. Right atrial size was mildly dilated.  4. Pericardial effusion seen on most recent echocardiogram has resolved.  5. The mitral valve is degenerative. No evidence of mitral valve regurgitation.  6. Aortic valve regurgitation is not visualized.  7. The inferior vena cava is normal in size with greater than 50% respiratory variability, suggesting right atrial pressure of 3 mmHg. FINDINGS  Left Ventricle: Left ventricular ejection fraction, by estimation, is 60 to 65%. The left ventricle has normal function. The left ventricle has no regional wall motion abnormalities. The left ventricular internal cavity size was normal in size. There is  borderline concentric left ventricular hypertrophy. Right Ventricle: The right ventricular size is mildly enlarged. No increase in right ventricular wall thickness. Left Atrium: Left atrial size was normal in size. Right Atrium: Right atrial size was mildly dilated. Pericardium: Pericardial effusion seen on most recent echocardiogram has resolved. There is no evidence of pericardial effusion. Mitral Valve: The mitral valve is degenerative in appearance. Mild mitral annular calcification. Tricuspid Valve: The tricuspid valve is grossly normal. Tricuspid valve regurgitation is mild. Aortic Valve: Aortic valve regurgitation is not visualized. There is mild thickening of the aortic valve. Aorta: The aortic root is normal in size and structure. Venous: The inferior vena cava is normal in size with greater than 50% respiratory variability, suggesting right atrial pressure of 3 mmHg. IAS/Shunts: No atrial level shunt detected by color flow  Doppler.  LEFT VENTRICLE PLAX 2D LVIDd:         5.39 cm LVIDs:         3.47 cm LV PW:         1.03 cm LV IVS:        1.04 cm  LVOT diam:     2.40 cm LVOT Area:     4.52 cm  LEFT ATRIUM         Index LA diam:    4.30 cm 1.75 cm/m   AORTA Ao Root diam: 3.40 cm  SHUNTS Systemic Diam: 2.40 cm Kate Sable MD Electronically signed by Kate Sable MD Signature Date/Time: 07/25/2019/2:41:52 PM    Final    ECHOCARDIOGRAM LIMITED  Result Date: 07/09/2019    ECHOCARDIOGRAM LIMITED REPORT   Patient Name:   MITHUN GOLDSWORTHY Date of Exam: 07/09/2019 Medical Rec #:  [id number]      Height:       69.0 in Accession #:    [id number]     Weight:       296.3 lb Date of Birth:  March 31, 1951      BSA:          2.442 m Patient Age:    72 years       BP:           105/78 mmHg Patient Gender: M              HR:           81 bpm. Exam Location:  [facility] Procedure: Limited Echo and Saline Contrast Bubble Study Indications:    Pericardial effusion 423.9 / I31.3  History:        Patient has prior history of Echocardiogram examinations, most                 recent 06/26/2019. CHF, COPD, Arrythmias:Atrial Fibrillation; Risk                 Factors:Diabetes, Hypertension and Dyslipidemia. Pericardial                 Effusion, NHL (non-Hodgkin's lymphoma.  Sonographer:    Leavy Cella RDCS (AE) Referring Phys: [id number] [place]  1. Left ventricular ejection fraction, by estimation, is 55 to 60%. The left ventricle has normal function. The left ventricle has no regional wall motion abnormalities. There is mild concentric left ventricular hypertrophy. Left ventricular diastolic parameters are indeterminate.  2. Right ventricular systolic function is mildly reduced. The right ventricular size is mildly enlarged.  3. Left atrial size was mildly dilated.  4. Right atrial size was mildly dilated.  5. Moderate pericardial effusion. The pericardial effusion is circumferential. There is no evidence of cardiac tamponade.  6. The  mitral valve is degenerative. No evidence of mitral valve regurgitation.  7. The aortic valve was not well visualized. No aortic stenosis is present.  8. The inferior vena cava is normal in size with greater than 50% respiratory variability, suggesting right atrial pressure of 3 mmHg. FINDINGS  Left Ventricle: Left ventricular ejection fraction, by estimation, is 55 to 60%. The left ventricle has normal function. The left ventricle has no regional wall motion abnormalities. The left ventricular internal cavity size was normal in size. There is  mild concentric left ventricular hypertrophy. Right Ventricle: The right ventricular size is mildly enlarged. Right ventricular systolic function is mildly reduced. Left Atrium: Left atrial size was mildly dilated. Right Atrium: Right atrial size was mildly dilated. Pericardium: A moderately sized pericardial effusion is present. The pericardial effusion is circumferential. There is no evidence of cardiac tamponade. Mitral Valve: The mitral valve is degenerative in appearance. Mild mitral annular calcification. Aortic Valve: The aortic valve was not well visualized. . There is mild thickening and mild calcification of  the aortic valve. No aortic stenosis is present. Mild aortic valve annular calcification. There is mild thickening of the aortic valve. There is mild calcification of the aortic valve. Pulmonic Valve: The pulmonic valve was not well visualized. Pulmonic valve regurgitation is not visualized. Aorta: The aortic root is normal in size and structure. Venous: The inferior vena cava is normal in size with greater than 50% respiratory variability, suggesting right atrial pressure of 3 mmHg. IAS/Shunts: No atrial level shunt detected by color flow Doppler. Agitated saline contrast was given intravenously to evaluate for intracardiac shunting.  LEFT VENTRICLE PLAX 2D LVIDd:         4.87 cm LVIDs:         3.77 cm LV PW:         1.02 cm LV IVS:        1.23 cm LVOT diam:      2.00 cm LVOT Area:     3.14 cm  LEFT ATRIUM         Index LA diam:    4.70 cm 1.92 cm/m   AORTA Ao Root diam: 3.40 cm MITRAL VALVE MV Area (PHT): 4.31 cm     SHUNTS MV Decel Time: 176 msec     Systemic Diam: 2.00 cm MV E velocity: 109.00 cm/s MV A velocity: 37.30 cm/s MV E/A ratio:  2.92 Kate Sable MD Electronically signed by Kate Sable MD Signature Date/Time: 07/09/2019/10:57:31 AM    Final       Subjective: Pt says he feels better today and wants to go home.  He is no longer complaining of SOB, he denies chest pain.  He ambulated with PT today. HHPT was recommended but he declined.   Discharge Exam: Vitals:   07/30/19 0734 07/30/19 1105  BP:    Pulse:    Resp:    Temp:    SpO2: 98% 96%   Vitals:   07/30/19 0500 07/30/19 0615 07/30/19 0734 07/30/19 1105  BP:  (!) 112/57    Pulse:  (!) 56    Resp:  20    Temp:  (!) 97.3 F (36.3 C)    TempSrc:  Oral    SpO2:  100% 98% 96%  Weight: 133.1 kg     Height:       General: Pt is alert, awake, not in acute distress Cardiovascular: RRR, S1/S2 +, no rubs, no gallops Respiratory: CTA bilaterally, no wheezing, no rhonchi Abdominal: Soft, NT, ND, bowel sounds + Extremities: no edema, no cyanosis   The results of significant diagnostics from this hospitalization (including imaging, microbiology, ancillary and laboratory) are listed below for reference.     Microbiology: Recent Results (from the past 240 hour(s))  Urine culture     Status: Abnormal   Collection Time: 07/28/19 12:27 PM   Specimen: Urine, Catheterized  Result Value Ref Range Status   Specimen Description   Final    URINE, CATHETERIZED Performed at [facility], [address] [place], [place] [address]    Special Requests   Final    NONE Performed at [facility], [address] [place], [place] [address]    Culture (A)  Final    <10,000 COLONIES/mL INSIGNIFICANT GROWTH Performed at [facility] [address], [place],  [address]     Report Status 07/30/2019 FINAL  Final  Blood Culture (routine x 2)     Status: None (Preliminary result)   Collection Time: 07/28/19 12:41 PM   Specimen: Right Antecubital; Blood  Result Value Ref Range Status   Specimen Description   Final    RIGHT ANTECUBITAL BOTTLES DRAWN AEROBIC AND ANAEROBIC   Special Requests Blood Culture adequate volume  Final   Culture   Final    NO GROWTH 2 DAYS Performed at [facility], [address], [place], [place] [address]    Report Status PENDING  Incomplete  Respiratory Panel by RT PCR (Flu A&B, Covid) - Nasopharyngeal Swab     Status: None   Collection Time: 07/28/19 12:41 PM   Specimen: Nasopharyngeal Swab  Result Value Ref Range Status   SARS Coronavirus 2 by RT PCR NEGATIVE NEGATIVE Final    Comment: (NOTE) SARS-CoV-2 target nucleic acids are NOT DETECTED. The SARS-CoV-2 RNA is generally detectable in upper respiratoy specimens during the acute phase of infection. The lowest concentration of SARS-CoV-2 viral copies this assay can detect is 131 copies/mL. A negative result does not preclude SARS-Cov-2 infection and should not be used as the sole basis for treatment or other patient management decisions. A negative result may occur with  improper specimen collection/handling, submission of specimen other than nasopharyngeal swab, presence of viral mutation(s) within the areas targeted by this assay, and inadequate number of viral copies (<131 copies/mL). A negative result must be combined with clinical observations, patient history, and epidemiological information. The expected result is Negative. Fact Sheet for Patients:  PinkCheek.be Fact Sheet for Healthcare Providers:  GravelBags.it This test is not yet ap proved or cleared by the [place] FDA and  has been authorized for detection and/or diagnosis of SARS-CoV-2 by FDA under an Emergency Use Authorization (EUA). This EUA  will remain  in effect (meaning this test can be used) for the duration of the COVID-19 declaration under Section 564(b)(1) of the Act, 21 U.S.C. section 360bbb-3(b)(1), unless the authorization is terminated or revoked sooner.    Influenza A by PCR NEGATIVE NEGATIVE Final   Influenza B by PCR NEGATIVE NEGATIVE Final    Comment: (NOTE) The Xpert Xpress SARS-CoV-2/FLU/RSV assay is intended as an aid in  the diagnosis of influenza from Nasopharyngeal swab specimens and  should not be used as a sole basis for treatment. Nasal washings and  aspirates are unacceptable for Xpert Xpress SARS-CoV-2/FLU/RSV  testing. Fact Sheet for Patients: PinkCheek.be Fact Sheet for Healthcare Providers: GravelBags.it This test is not yet approved or cleared by the [place] FDA and  has been authorized for detection and/or diagnosis of SARS-CoV-2 by  FDA under an Emergency Use Authorization (EUA). This EUA will remain  in effect (meaning this test can be used) for the duration of the  Covid-19 declaration under Section 564(b)(1) of the Act, 21  U.S.C. section 360bbb-3(b)(1), unless the authorization is  terminated or revoked. Performed at [facility], [address], [place], [place] [address]   Blood Culture (routine x 2)     Status: None (Preliminary result)   Collection Time: 07/28/19 12:42 PM   Specimen: Left Antecubital; Blood  Result Value Ref Range Status   Specimen Description   Final    LEFT ANTECUBITAL BOTTLES DRAWN AEROBIC AND ANAEROBIC   Special Requests Blood Culture adequate volume  Final   Culture   Final    NO GROWTH 2 DAYS Performed at [facility], [address] [place], [place] [address]    Report Status PENDING  Incomplete     Labs: BNP (last 3 results) Recent Labs    07/04/19 1408 07/08/19 1628 07/28/19 1227  BNP 159.0* 151.0* 243.0*  Basic Metabolic Panel: Recent Labs  Lab 07/28/19 1227 07/28/19 1242  07/29/19 0431 07/30/19 0432  NA 140  --  140 139  K 3.5  --  4.3 4.9  CL 94*  --  101 100  CO2 34*  --  32 32  GLUCOSE 47*  --  129* 168*  BUN 33*  --  27* 22  CREATININE 1.34*  --  1.17 1.02  CALCIUM 9.8  --  9.0 9.1  MG  --  1.9  --  1.8   Liver Function Tests: Recent Labs  Lab 07/28/19 1227  AST 28  ALT 17  ALKPHOS 90  BILITOT 0.7  PROT 8.2*  ALBUMIN 3.8   No results for input(s): LIPASE, AMYLASE in the last 168 hours. No results for input(s): AMMONIA in the last 168 hours. CBC: Recent Labs  Lab 07/28/19 1227 07/30/19 0432  WBC 8.3 6.3  NEUTROABS 6.6  --   HGB 13.3 10.9*  HCT 42.5 35.8*  MCV 93.4 94.5  PLT 288 192   Cardiac Enzymes: No results for input(s): CKTOTAL, CKMB, CKMBINDEX, TROPONINI in the last 168 hours. BNP: Invalid input(s): POCBNP CBG: Recent Labs  Lab 07/29/19 1601 07/29/19 2200 07/30/19 0255 07/30/19 0705 07/30/19 1101  GLUCAP 221* 209* 142* 164* 269*   D-Dimer No results for input(s): DDIMER in the last 72 hours. Hgb A1c No results for input(s): HGBA1C in the last 72 hours. Lipid Profile No results for input(s): CHOL, HDL, LDLCALC, TRIG, CHOLHDL, LDLDIRECT in the last 72 hours. Thyroid function studies No results for input(s): TSH, T4TOTAL, T3FREE, THYROIDAB in the last 72 hours.  Invalid input(s): FREET3 Anemia work up No results for input(s): VITAMINB12, FOLATE, FERRITIN, TIBC, IRON, RETICCTPCT in the last 72 hours. Urinalysis    Component Value Date/Time   COLORURINE STRAW (A) 07/28/2019 1227   APPEARANCEUR CLEAR 07/28/2019 1227   APPEARANCEUR Clear 05/30/2018 1147   LABSPEC 1.010 07/28/2019 1227   PHURINE 6.0 07/28/2019 1227   GLUCOSEU 50 (A) 07/28/2019 1227   HGBUR NEGATIVE 07/28/2019 1227   BILIRUBINUR NEGATIVE 07/28/2019 1227   BILIRUBINUR Negative 05/30/2018 1147   [place] 07/28/2019 1227   PROTEINUR NEGATIVE 07/28/2019 1227   UROBILINOGEN negative 05/01/2013 1115   UROBILINOGEN 0.2 05/18/2010 0352    NITRITE NEGATIVE 07/28/2019 1227   LEUKOCYTESUR TRACE (A) 07/28/2019 1227   Sepsis Labs Invalid input(s): PROCALCITONIN,  WBC,  LACTICIDVEN Microbiology Recent Results (from the past 240 hour(s))  Urine culture     Status: Abnormal   Collection Time: 07/28/19 12:27 PM   Specimen: Urine, Catheterized  Result Value Ref Range Status   Specimen Description   Final    URINE, CATHETERIZED Performed at [facility], [address], [place], [place] [address]    Special Requests   Final    NONE Performed at [facility], [address], [place], [place] [address]    Culture (A)  Final    <10,000 COLONIES/mL INSIGNIFICANT GROWTH Performed at [facility], [place] [address], [place], [place] [address]    Report Status 07/30/2019 FINAL  Final  Blood Culture (routine x 2)     Status: None (Preliminary result)   Collection Time: 07/28/19 12:41 PM   Specimen: Right Antecubital; Blood  Result Value Ref Range Status   Specimen Description   Final    RIGHT ANTECUBITAL BOTTLES DRAWN AEROBIC AND ANAEROBIC   Special Requests Blood Culture adequate volume  Final   Culture   Final    NO GROWTH  2 DAYS Performed at [facility], [address], [place], [place] [address]    Report Status PENDING  Incomplete  Respiratory Panel by RT PCR (Flu A&B, Covid) - Nasopharyngeal Swab     Status: None   Collection Time: 07/28/19 12:41 PM   Specimen: Nasopharyngeal Swab  Result Value Ref Range Status   SARS Coronavirus 2 by RT PCR NEGATIVE NEGATIVE Final    Comment: (NOTE) SARS-CoV-2 target nucleic acids are NOT DETECTED. The SARS-CoV-2 RNA is generally detectable in upper respiratoy specimens during the acute phase of infection. The lowest concentration of SARS-CoV-2 viral copies this assay can detect is 131 copies/mL. A negative result does not preclude SARS-Cov-2 infection and should not be used as the sole basis for treatment or other patient management decisions. A negative result may occur with   improper specimen collection/handling, submission of specimen other than nasopharyngeal swab, presence of viral mutation(s) within the areas targeted by this assay, and inadequate number of viral copies (<131 copies/mL). A negative result must be combined with clinical observations, patient history, and epidemiological information. The expected result is Negative. Fact Sheet for Patients:  PinkCheek.be Fact Sheet for Healthcare Providers:  GravelBags.it This test is not yet ap proved or cleared by the [place] FDA and  has been authorized for detection and/or diagnosis of SARS-CoV-2 by FDA under an Emergency Use Authorization (EUA). This EUA will remain  in effect (meaning this test can be used) for the duration of the COVID-19 declaration under Section 564(b)(1) of the Act, 21 U.S.C. section 360bbb-3(b)(1), unless the authorization is terminated or revoked sooner.    Influenza A by PCR NEGATIVE NEGATIVE Final   Influenza B by PCR NEGATIVE NEGATIVE Final    Comment: (NOTE) The Xpert Xpress SARS-CoV-2/FLU/RSV assay is intended as an aid in  the diagnosis of influenza from Nasopharyngeal swab specimens and  should not be used as a sole basis for treatment. Nasal washings and  aspirates are unacceptable for Xpert Xpress SARS-CoV-2/FLU/RSV  testing. Fact Sheet for Patients: PinkCheek.be Fact Sheet for Healthcare Providers: GravelBags.it This test is not yet approved or cleared by the [place] FDA and  has been authorized for detection and/or diagnosis of SARS-CoV-2 by  FDA under an Emergency Use Authorization (EUA). This EUA will remain  in effect (meaning this test can be used) for the duration of the  Covid-19 declaration under Section 564(b)(1) of the Act, 21  U.S.C. section 360bbb-3(b)(1), unless the authorization is  terminated or revoked. Performed at  [facility], [address] [place], [place] [address]   Blood Culture (routine x 2)     Status: None (Preliminary result)   Collection Time: 07/28/19 12:42 PM   Specimen: Left Antecubital; Blood  Result Value Ref Range Status   Specimen Description   Final    LEFT ANTECUBITAL BOTTLES DRAWN AEROBIC AND ANAEROBIC   Special Requests Blood Culture adequate volume  Final   Culture   Final    NO GROWTH 2 DAYS Performed at [facility], [address] [place], [place] [address]    Report Status PENDING  Incomplete   Time coordinating discharge:   SIGNED:  Irwin Brakeman, MD  [facility] 07/30/2019, 12:33 PM How to contact the [facility] Attending or Consulting provider [place] or covering provider during after hours [place], for this patient?  1. Check the care team in [facility] and look for a) attending/consulting TR[facility] provider listed and b) the [facility] team listed 2. Log into [email or web address] and  use 's universal password to access. If you do not have the password, please contact the hospital operator. 3. Locate the [facility] provider you are looking for under [facility] and page to a number that you can be directly reached. 4. If you still have difficulty reaching the provider, please page the [facility] (Director on Call) for the Hospitalists listed on amion for assistance.

## 2019-07-30 NOTE — Progress Notes (Signed)
Inpatient Diabetes Program Recommendations  AACE/ADA: New Consensus Statement on Inpatient Glycemic Control  Target Ranges:  Prepandial:   less than 140 mg/dL      Peak postprandial:   less than 180 mg/dL (1-2 hours)      Critically ill patients:  140 - 180 mg/dL   Results for Justin Ruiz, Justin Ruiz (MRN [id number]) as of 07/30/2019 09:54  Ref. Range 07/29/2019 08:06 07/29/2019 11:43 07/29/2019 16:01 07/29/2019 22:00 07/30/2019 02:55 07/30/2019 07:05  Glucose-Capillary Latest Ref Range: 70 - 99 mg/dL 128 (H) 277 (H) 221 (H) 209 (H) 142 (H) 164 (H)  Results for Justin Ruiz, Justin Ruiz (MRN [id number]) as of 07/30/2019 09:54  Ref. Range 07/28/2019 12:18 07/28/2019 13:44 07/28/2019 18:50 07/28/2019 21:59  Glucose-Capillary Latest Ref Range: 70 - 99 mg/dL 49 (L) 84 88 195 (H)  Results for Justin Ruiz, Justin Ruiz (MRN [id number]) as of 07/30/2019 09:54  Ref. Range 06/25/2019 11:20  Hemoglobin A1C Latest Ref Range: 4.8 - 5.6 % 9.1 (H)   Review of Glycemic Control  Diabetes history: DM2 Outpatient Diabetes medications: NPH 48-55 units BID, Regular 0-15 units TID with meals (based on glucose), Ozempic 1 mg Qweek (Saturday) Current orders for Inpatient glycemic control: Novolog 10 units TID with meals, Novolog 0-9 units TID with meals, Novolog 0-5 units QHS  NOTE: Noted patient initially hypoglycemic and only ordered Novolog meal coverage and correction as an inpatient currently. Spoke with patient regarding DM control and outpatient DM medications. Patient reports that he is taking NPH 48-55 units BID (if CBG less than 100 mg/dl he takes 48 units and if over 100 mg/dl he takes 55 units), Regular 0-15 units TID (dose depends on glucose), and Ozempic 1 mg Qweek (Saturday) for DM control. Patient states that he has been on Ozempic for 2-3 weeks and glucose has been running lower since starting Ozempic. Patient reports that he does not have issues with hypoglycemia that he is aware of. Noted patient seen PCP on 07/04/19 and per MD note on 07/04/19 patient's  glucose on labs was 45 mg/dl. Patient states that he did not feel like he was hypoglycemic at that time and patient states he does not recall having symptoms of hypoglycemia with initial glucose of 49 mg/dl on 07/28/19.  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 Encouraged patient to continuing checking glucose frequently and follow up with PCP so adjustments can be made if he experiences any further issues with hypoglycemia. Patient verbalized understanding of information discussed and states that he has no questions at this time. Sent chat message to Dr. Wynetta Emery to make aware that patient may need adjustments with outpatient insulin.  Thanks, Barnie Alderman, RN, MSN, CDE Diabetes Coordinator Inpatient Diabetes Program [phone] (Team Pager from 8am to 5pm)

## 2019-07-30 NOTE — Plan of Care (Signed)
  Problem: Acute Rehab PT Goals(only PT should resolve) Goal: Pt Will Go Sit To Supine/Side Outcome: Progressing Flowsheets (Taken 07/30/2019 0901) Pt will go Sit to Supine/Side: with supervision Goal: Patient Will Transfer Sit To/From Stand Outcome: Progressing Flowsheets (Taken 07/30/2019 0901) Patient will transfer sit to/from stand: with modified independence Goal: Pt Will Transfer Bed To Chair/Chair To Bed Outcome: Progressing Flowsheets (Taken 07/30/2019 0901) Pt will Transfer Bed to Chair/Chair to Bed: with supervision Goal: Pt Will Ambulate Outcome: Progressing Flowsheets (Taken 07/30/2019 0901) Pt will Ambulate:  50 feet  with modified independence  with least restrictive assistive device Goal: Pt/caregiver will Perform Home Exercise Program Outcome: Progressing Flowsheets (Taken 07/30/2019 0901) Pt/caregiver will Perform Home Exercise Program:  For increased strengthening  For improved balance  Independently  9:03 AM, 07/30/19 Mearl Latin PT, DPT Physical Therapist at [facility]

## 2019-07-30 NOTE — Telephone Encounter (Signed)
Called to offer appt in [place] office for 4/7.  Per wife - pt is admitted at [facility] at this time for dyspnea but is to be d/ced today.  She is requesting I schedule him for tomorrow with Dr Warren Lacy but she will c/b if for some reason he can not come to the appt.  Pt did have 2 D Echo 4/5 to f/u pericardial effusion as ordered.

## 2019-07-30 NOTE — Discharge Instructions (Signed)
Please wear your CPAP every night.  Increase home oxygen to 4 liters PLEASE CHECK BLOOD SUGAR 5 times per day  Chronic Respiratory Failure  Respiratory failure is a condition in which the lungs do not work well and the breathing (respiratory) system fails. When respiratory failure occurs, it becomes difficult for the lungs to get enough oxygen or to eliminate carbon dioxide or to do both duties. If the lungs do not work properly, the heart, brain, and other body systems do not get enough oxygen. Respiratory failure is life-threatening if it is not treated. Respiratory failure can be acute or chronic. Acute respiratory failure is sudden and severe and requires emergency medical treatment. Chronic respiratory failure happens over time, usually due to a medical condition that gets worse. What are the causes? This condition may be caused by any problem that affects the heart or lungs. Causes include:  Chronic bronchitis and emphysema (COPD).  Pulmonary fibrosis.  Water in the lungs due to heart failure, lung injury, or infection (pulmonary edema).  Asthma.  Nerve or muscle diseases that make chest movements difficult, such as Leta Baptist disease or Guillain-Barre syndrome.  A collapsed lung (pneumothorax).  Pulmonary hypertension.  Chronic sleep apnea.  Pneumonia.  Obesity.  A blood clot in a lung (pulmonary embolism).  Trauma to the chest that makes breathing difficult. What increases the risk? You are more likely to develop this condition if:  You are a smoker, or have a history of smoking.  You have a weak immune system.  You have a family history of breathing problems or lung disease.  You have a long term lung disease such as COPD. What are the signs or symptoms? Symptoms of this condition include:  Shortness of breath with or without activity.  Difficulty breathing.  Wheezing.  A fast or irregular heartbeat (arrhythmia).  Chest pain or tightness.  A bluish  color to the fingernail or toenail beds (cyanosis).  Confusion.  Drowsiness.  Extreme fatigue, especially with minimal activity. How is this diagnosed? This condition may be diagnosed based on:  Your medical history.  A physical exam.  Other tests, such as: ? A chest X-ray. ? A CT scan of your lungs. ? Blood tests, such as an arterial blood gas test. This test is done to check if you have enough oxygen in your blood. ? An electrocardiogram. This test records the electrical activity of your heart. ? An echocardiogram. This test uses sound waves to produce an image of your heart.  A check of your blood pressure, heart rate, breathing rate, and blood oxygen level. How is this treated? Treatment for this condition depends on the cause. Treatment can include the following:  Getting oxygen through a nasal cannula. This is a tube that goes in your nose.  Getting oxygen through a face mask.  Receiving noninvasive positive pressure ventilation. This is a method of breathing support in which a machine blows air into your lungs through a mask. The machine allows you to breathe on your own. It helps the body take in oxygen and eliminate carbon dioxide.  Using a ventilator. This is a breathing machine that delivers oxygen to the lungs through a breathing tube that is put into the trachea. This machine is used when you can no longer breathe well enough on your own.  Medicines to help with breathing, such as: ? Medicines that open up and relax air passages, such as bronchodilators. These may be given through a device that turns liquid medicines into  a mist you can breathe in (nebulizer). These medicines help with breathing. ? Diuretics. These medicines get rid of extra fluid out of your lungs, which can help you breathe better. ? Steroid medicines. These decrease inflammation in the lungs. ? Antibiotic medicines. These may be given to treat a bacterial infection, such as pneumonia.  Pulmonary  rehabilitation. This is an exercise program that strengthens the muscles in your chest and helps you learn breathing techniques in order to manage your condition. Follow these instructions at home: Medicines  Take over-the-counter and prescription medicines only as told by your health care provider.  If you were prescribed an antibiotic medicine, take it as told by your health care provider. Do not stop taking the antibiotic even if you start to feel better. General instructions  Use oxygen therapy and pulmonary rehabilitation if directed to by your health care provider. If you require home oxygen therapy, ask your health care provider whether you should purchase a pulse oximeter to measure your oxygen level at home.  Work with your health care provider to create a plan to help you deal with your condition. Follow this plan.  Do not use any products that contain nicotine or tobacco, such as cigarettes and e-cigarettes. If you need help quitting, ask your health care provider.  Avoid exposure to irritants that make your breathing problems worse. These include smoke, chemicals, and fumes.  Stay active, but balance activity with periods of rest. Exercise and physical activity will help you maintain your ability to do things you want to do.  Stay up to date on all vaccines, especially yearly influenza and pneumonia vaccines.  Avoid people who are sick as well as crowded places during the flu season.  Keep all follow-up visits as told by your health care provider. This is important. Contact a health care provider if:  Your shortness of breath gets worse and you cannot do the things you used to do.  You have increased mucus (sputum), wheezing, coughing, or loss of energy.  You are on oxygen therapy and you are starting to need more.  You need to use your medicines more often.  You have a fever. Get help right away if:  Your shortness of breath becomes worse.  You are unable to say  more than a few words without having to catch your breath.  You develop chest pain or tightness. Summary  Respiratory failure is a condition in which the lungs do not work well and the breathing system fails.  This condition can be very serious and is often life-threatening.  This condition is diagnosed with tests and can be treated with medicines or oxygen.  Contact a health care provider if your shortness of breath gets worse or if you need to use your oxygen or medicines more often than before. This information is not intended to replace advice given to you by your health care provider. Make sure you discuss any questions you have with your health care provider. Document Revised: 03/24/2017 Document Reviewed: 04/22/2016 Elsevier Patient Education  [place].   IMPORTANT INFORMATION: PAY CLOSE ATTENTION   PHYSICIAN DISCHARGE INSTRUCTIONS  Follow with Primary care provider  [name], MD  and other consultants as instructed by your Hospitalist Physician  [place] IF SYMPTOMS COME BACK, WORSEN OR NEW PROBLEM DEVELOPS   Please note: You were cared for by a hospitalist during your hospital stay. Every effort will be made to forward records to your primary  care provider.  You can request that your primary care provider send for your hospital records if they have not received them.  Once you are discharged, your primary care physician will handle any further medical issues. Please note that NO REFILLS for any discharge medications will be authorized once you are discharged, as it is imperative that you return to your primary care physician (or establish a relationship with a primary care physician if you do not have one) for your post hospital discharge needs so that they can reassess your need for medications and monitor your lab values.  Please get a complete blood count and chemistry panel checked by your Primary MD at your next visit, and  again as instructed by your Primary MD.  Get Medicines reviewed and adjusted: Please take all your medications with you for your next visit with your Primary MD  Laboratory/radiological data: Please request your Primary MD to go over all hospital tests and procedure/radiological results at the follow up, please ask your primary care provider to get all Hospital records sent to his/her office.  In some cases, they will be blood work, cultures and biopsy results pending at the time of your discharge. Please request that your primary care provider follow up on these results.  If you are diabetic, please bring your blood sugar readings with you to your follow up appointment with primary care.    Please call and make your follow up appointments as soon as possible.    Also Note the following: If you experience worsening of your admission symptoms, develop shortness of breath, life threatening emergency, suicidal or homicidal thoughts you must seek medical attention immediately by calling 911 or calling your MD immediately  if symptoms less severe.  You must read complete instructions/literature along with all the possible adverse reactions/side effects for all the Medicines you take and that have been prescribed to you. Take any new Medicines after you have completely understood and accpet all the possible adverse reactions/side effects.   Do not drive when taking Pain medications or sleeping medications (Benzodiazepines)  Do not take more than prescribed Pain, Sleep and Anxiety Medications. It is not advisable to combine anxiety,sleep and pain medications without talking with your primary care practitioner  Special Instructions: If you have smoked or chewed Tobacco  in the last 2 yrs please stop smoking, stop any regular Alcohol  and or any Recreational drug use.  Wear Seat belts while driving.  Do not drive if taking any narcotic, mind altering or controlled substances or recreational drugs or  alcohol.

## 2019-07-30 NOTE — Progress Notes (Signed)
Cardiology Office Note   Date:  07/31/2019   ID:  Justin Ruiz, DOB [id number], MRN [id number]  PCP:  [name], MD  Cardiologist:   Minus Breeding, MD   Chief Complaint  Patient presents with  . Shortness of Breath      History of Present Illness: Justin Ruiz is a 69 y.o. male who presents for followup atrial fib.  With a history of COPD and  acute on chronic heart failure with an EF on echo of 50%.   He was discharged from the hospital two days ago.  I reviewed these records for this visit.   He was in the hospital in March as well a couple of times   Again, I reviewed these records for this appt.  He had a moderate pericardial effusion in March.  His episodes of respiratory failure are thought to be multifactorial with acute diastolic HF and COPD.   He also has had severe hypoglycemia on presentation.     On 4/5 he had a repeat echo.   There was no evidence of pericardial effusion.  He said that he felt okay after he got home from the hospital but the last night he had a difficult night.  He felt like he was acutely short of breath.  He orders.  He sleeps chronically in a chair.  He has 3 L of oxygen all the time he turns it up before and after he is moving around.  He did not have pain.  He has not had swelling.  He did not have any cough fevers or chills.  He has not had any edema.  He is trying to avoid salt.  He has not had any new chest pressure, neck or arm discomfort.  He says that his oxygen level is actually pretty decent in the 90s when he was short of breath.   Past Medical History:  Diagnosis Date  . Acute diastolic heart failure ([place]) 03/28/2016  . Acute encephalopathy 03/28/2016  . Acute encephalopathy 03/28/2016  . Acute on chronic respiratory failure with hypoxia ([place]) 12/04/2016  . Arthritis   . Asthma   . Atrial fibrillation ([place])    on AC  . Benign neoplasm of ascending colon 05/05/2014  . BPH (benign prostatic hypertrophy)   . CARDIOVASCULAR STUDIES,  ABNORMAL 04/27/2009   Qualifier: Diagnosis of  By: Percival Spanish, MD, Farrel Gordon    . Chronic anticoagulation 08/20/2012  . Chronic respiratory failure with hypoxia ([place]) 11/10/2017  . Colon polyps   . COPD mixed type ([place]) 03/28/2016   PFT 06/17/14- severe obstructive airways disease with slight response to bronchodilator. FVC 2.03/42%, FEV1 1.02/28%, ratio 0.5, TLC 82%, DLCO 59%  . Diabetes mellitus   . Diabetic neuropathy, type II diabetes mellitus ([place]) 09/25/2014  . Diabetic retinopathy ([place]) 05/28/2017  . 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 GERD (gastroesophageal reflux disease)   . HTN (hypertension)    x 3 years  . Hyperlipidemia   . Lung nodules 03/11/2013  . Mediastinal adenopathy 06/20/2013   CT  & PET 2/15   . Memory loss   . 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 NHL (non-Hodgkin's lymphoma) ([place])    nhl dx 9/11  . Obesity    exogenous  . Obstructive sleep apnea 05/25/2014   NPSG-  04/07/2014-severe obstructive sleep apnea-AHI 75.1 per hour. CPAP titrated to 16. He required supplemental oxygen at 2 L/ [employer] which he already wears  for sleep at home. Weight 338 pounds   . Peripheral vascular insufficiency ([place]) 08/02/2017  . Personal history of colonic polyps 03/02/2009   Qualifier: Diagnosis of  By: Deatra Ina MD, Sandy Salaam   . Sepsis ([place]) 06/07/2015  . Severe obesity (BMI >= 40) ([place]) 05/01/2013  . Shingles   . Sleep apnea    CPAP machine is broken  . Thrombocytopenia ([place]) 11/23/2016    Past Surgical History:  Procedure Laterality Date  . APPENDECTOMY    . COLONOSCOPY WITH PROPOFOL N/A 05/05/2014   Procedure: COLONOSCOPY WITH PROPOFOL;  Surgeon: Inda Castle, MD;  Location: [facility] END[facility]COPY;  Service: Endoscopy;  Laterality: N/A;     Current Outpatient Medications  Medication Sig Dispense Refill  . acetaminophen (TYLENOL) 325 MG tablet Take 650 mg by mouth every 6 (six) hours as needed.    Marland Kitchen albuterol (VENTOLIN HFA)  108 (90 Base) MCG/ACT inhaler Inhale 2 puffs into the lungs every 4 (four) hours as needed for wheezing or shortness of breath.    . bisoprolol (ZEBETA) 5 MG tablet Take 1 tablet (5 mg total) by mouth 2 (two) times daily. 60 tablet 0  . budesonide-formoterol (SYMBICORT) 160-4.5 MCG/ACT inhaler Inhale 2 puffs into the lungs 2 (two) times daily. 1 Inhaler 11  . Cholecalciferol (VITAMIN D3) 5000 UNITS TABS Take 1 tablet by mouth every morning.    Marland Kitchen ELIQUIS 5 MG TABS tablet Take 1 tablet by mouth twice daily 60 tablet 2  . furosemide (LASIX) 40 MG tablet Take 1-2 tablets (40-80 mg total) by mouth 2 (two) times daily. Patient takes 80mg  in the morning and 40mg  in the evening (Patient taking differently: 40 mg. Patient takes 80 mg in the morning and 40 mg in the evening)    . insulin regular (NOVOLIN R) 100 units/mL injection Inject 0.08 mLs (8 Units total) into the skin 3 (three) times daily before meals. 10 mL 5  . nystatin (MYCOSTATIN/NYSTOP) powder Apply topically 2 (two) times daily. Apply to groin area and scrotum skin; keep area clean and dry. 15 g 0  . oxymetazoline (AFRIN) 0.05 % nasal spray Place 1 spray into both nostrils 2 (two) times daily as needed for congestion.    Marland Kitchen PARoxetine (PAXIL) 20 MG tablet Take 1 tablet by mouth once daily 90 tablet 0  . Semaglutide, 1 MG/DOSE, (OZEMPIC, 1 MG/DOSE,) 2 MG/1.5ML SOPN Inject 1 mg into the skin once a week. 2 pen 11  . simvastatin (ZOCOR) 40 MG tablet Take 1 tablet by mouth once daily 90 tablet 1  . ipratropium-albuterol (DUONEB) 0.5-2.5 (3) MG/3ML SOLN Take 3 mLs by nebulization every 6 (six) hours as needed. 1080 mL 3   No current facility-administered medications for this visit.    Allergies:   Avapro [irbesartan] and Lipitor [atorvastatin calcium]    ROS:  Please see the history of present illness.   Otherwise, review of systems are positive for none.   All other systems are reviewed and negative.    PHYSICAL EXAM: VS:  BP 130/76   Pulse  76   Temp 98.6 F (37 C)   Ht 5\' 9"  (1.753 m)   Wt 286 lb (129.7 kg)   SpO2 (!) 86% Comment: Between 86 - 90 while walking  BMI 42.23 kg/m  , BMI Body mass index is 42.23 kg/m. GENERAL:  Well appearing HEENT:  Pupils equal round and reactive, fundi not visualized, oral mucosa unremarkable NECK:  No jugular venous distention, waveform within normal limits, carotid upstroke brisk  and symmetric, no bruits, no thyromegaly LYMPHATICS:  No cervical, inguinal adenopathy LUNGS:  Clear to auscultation bilaterally BACK:  No CVA tenderness CHEST:  Unremarkable HEART:  PMI not displaced or sustained,S1 and S2 within normal limits, no S3, no clicks, no rubs, no murmurs ABD:  Flat, positive bowel sounds normal in frequency in pitch, no bruits, no rebound, no guarding, no midline pulsatile mass, no hepatomegaly, no splenomegaly EXT:  2 plus pulses throughout, no edema, no cyanosis no clubbing SKIN:  No rashes no nodules NEURO:  Cranial nerves II through XII grossly intact, motor grossly intact throughout PSYCH:  Cognitively intact, oriented to person place and time    EKG:  EKG is not ordered today. The ekg ordered 07/28/19 demonstrates atrial fibrillation, rate 62, axis within normal limits, early transition lead V2   Recent Labs: 07/08/2019: TSH 1.256 07/28/2019: ALT 17; B Natriuretic Peptide 243.0 07/30/2019: BUN 22; Creatinine, Ser 1.02; Hemoglobin 10.9; Magnesium 1.8; Platelets 192; Potassium 4.9; Sodium 139    Lipid Panel    Component Value Date/Time   CHOL 169 02/20/2019 0928   CHOL 140 11/14/2012 0918   TRIG 300 (H) 02/20/2019 0928   TRIG 159 (H) 02/17/2016 1006   TRIG 296 (H) 11/14/2012 0918   HDL 44 02/20/2019 0928   HDL 47 02/17/2016 1006   HDL 43 11/14/2012 0918   CHOLHDL 3.8 02/20/2019 0928   LDLCALC 77 02/20/2019 0928   LDLCALC 49 11/28/2013 1026   LDLCALC 38 11/14/2012 0918      Wt Readings from Last 3 Encounters:  07/31/19 286 lb (129.7 kg)  07/30/19 293 lb 6.9 oz  (133.1 kg)  07/10/19 298 lb 15.1 oz (135.6 kg)      Other studies Reviewed: Additional studies/ records that were reviewed today include: Hospital records. Review of the above records demonstrates:  Please see elsewhere in the note.     ASSESSMENT AND PLAN:  Pericardial Effusion This seemed to have resolved on the recent followup echo.  No change in therapy other than as below.  Chronic Diastolic CHF  He may have some volume overload still as the etiology of some of his symptoms but I expect a lot of this is a primary pulmonary process.  I am going to change his Lasix to torsemide 60 mg in the morning and 40 in the evening.  I want him to get a basic metabolic profile in 2 days.  Permanent Atrial Fibrillation He seems to have reasonable rate control.  He tolerates anticoagulation.  No change in therapy.  IDDM A1c is not well controlled but he is not had any recent hypoglycemia since hospitalization.  I will defer to his primary provider.  Chronic Hypoxic Respiratory Failure I am going to try to address this with his diuretic change as above.  He is to follow-up with his primary provider in 2 days.  He has follow-up with Dr. Annamaria Boots pulmonary in a couple of days after that.  He is to call 911 if he has any acute distress.  I think he is needs to have some goals of therapy and end-of-life discussions as he seems to be worsening with frequent hospitalizations.      Current medicines are reviewed at length with the patient today.  The patient does not have concerns regarding medicines.  The following changes have been made:  As above.   Labs/ tests ordered today include: None No orders of the defined types were placed in this encounter.    Disposition:  FU with me in 3 months.     Signed, Minus Breeding, MD  07/31/2019 3:34 PM    [facility]

## 2019-07-30 NOTE — TOC Transition Note (Addendum)
Transition of Care [facility]) - CM/SW Discharge Note   Patient Details  Name: Justin Ruiz MRN: [id number] Date of Birth: 1950/04/29  [facility] CM/SW Contact:  Boneta Lucks, RN Phone Number: 07/30/2019, 11:42 AM   Clinical Narrative:   Patient admitted with Dyspnea. PT is recommending HHPT, Patient states he walks with a walker lives alone, separated from wife, she does come by daily to check on him. She states patient is active with [facility] for RN.  Patient is now agreeing with add PT.  MD will add other services to assess patient home life.  Wife is concerned he will need long term care soon.  TOC advised her to check into Medicaid to see if he would qualify.  Also recommended Life alert, she will check into that and welcomes the additional services to assist with his planning.  He has no other family to help.    Home with [facility] updated  Barriers to Discharge: Barriers Resolved   Patient Goals and CMS Choice Patient states their goals for this hospitalization and ongoing recovery are:: to go home. CMS Medicare.gov Compare Post Acute Care list provided to:: Patient Choice offered to / list presented to : Patient  Discharge Placement       Patient and family notified of of transfer: 07/30/19  Discharge Plan and [place] with self care  Readmission Risk Interventions Readmission Risk Prevention Plan 06/28/2019  Transportation Screening Complete  HRI or Home Care Consult Complete  Social Work Consult for [place] Planning/Counseling Complete  Palliative Care Screening Not Applicable  Medication Review [employer]) Complete  Some recent data might be hidden

## 2019-07-30 NOTE — Evaluation (Signed)
Physical Therapy Evaluation Patient Details Name: Justin Ruiz MRN: [id number] DOB: 1950/06/26 Today's Date: 07/30/2019   History of Present Illness  Justin Ruiz is a 69 y.o. male with medical history significant for diastolic CHF, chronic respiratory failure, MGUS, non-Hodgkin's lymphoma atrial fibrillation with RVR, morbid obesity, diabetes mellitus.Patient presented to the ED with complaints of difficulty with breathing, progressive over the past month.  Reports at rest he is okay.  But with short ambulation he gets short of breath.  He reports a dry cough over the past 2 days.  No fevers, no chills.  No chest pain.  He denies swelling of his lower extremities. He reports compliance with his Eliquis and Lasix every day.  He denies bloating or weight gain. At baseline patient is on 3 L of oxygen, but he reports he has had to gradually increase it to 4-1/2 L over the past month.    Clinical Impression  Patient limited for functional mobility as stated below secondary to BLE weakness, fatigue and impaired standing balance. Patient requires min assist to transition to seated EOB. He transfers to standing with hand held assist and does not use AD to ambulate in room but requires guard for safety and balance. He only wishes to ambulate limited distance in room today and he ends session seated in chair at bedside. Patient will benefit from continued physical therapy in hospital and recommended venue below to increase strength, balance, endurance for safe ADLs and gait.     Follow Up Recommendations Home health PT    Equipment Recommendations  None recommended by PT    Recommendations for Other Services       Precautions / Restrictions Precautions Precautions: None Restrictions Weight Bearing Restrictions: No      Mobility  Bed Mobility Overal bed mobility: Needs Assistance Bed Mobility: Supine to Sit     Supine to sit: Min assist     General bed mobility comments: to transition  to seated EOB  Transfers Overall transfer level: Needs assistance Equipment used: 1 person hand held assist Transfers: Sit to/from Stand Sit to Stand: Min guard;Min assist         General transfer comment: hand held assist to transfer to standing  Ambulation/Gait Ambulation/Gait assistance: Min guard Gait Distance (Feet): 15 Feet Assistive device: None Gait Pattern/deviations: Step-through pattern;Decreased step length - right;Decreased step length - left;Decreased stride length Gait velocity: decreased   General Gait Details: Patient able to ambulate in room without AD, guard for safety/balance  Stairs            Wheelchair Mobility    Modified Rankin (Stroke Patients Only)       Balance Overall balance assessment: Needs assistance Sitting-balance support: Feet supported;No upper extremity supported Sitting balance-Leahy Scale: Good Sitting balance - Comments: seated EOB   Standing balance support: No upper extremity supported Standing balance-Leahy Scale: Fair Standing balance comment: standing without AD                             Pertinent Vitals/Pain Pain Assessment: No/denies pain    Home Living Family/patient expects to be discharged to:: Private residence Living Arrangements: Spouse/significant other Available Help at Discharge: Family;Available 24 hours/day Type of Home: House Home Access: Stairs to enter Entrance Stairs-Rails: Left Entrance Stairs-Number of Steps: 3 Home Layout: One level Home Equipment: Walker - 2 wheels;Cane - single point      Prior Function Level of Independence: Independent;Needs assistance  Gait / Transfers Assistance Needed: States household ambulator without AD  ADL's / Homemaking Assistance Needed: independent, family can assist if needed        Hand Dominance   Dominant Hand: Right    Extremity/Trunk Assessment   Upper Extremity Assessment Upper Extremity Assessment: Overall WFL for tasks  assessed    Lower Extremity Assessment Lower Extremity Assessment: Generalized weakness    Cervical / Trunk Assessment Cervical / Trunk Assessment: Normal  Communication   Communication: No difficulties  Cognition Arousal/Alertness: Awake/alert Behavior During Therapy: WFL for tasks assessed/performed Overall Cognitive Status: Within Functional Limits for tasks assessed                                        General Comments      Exercises     Assessment/Plan    PT Assessment Patient needs continued PT services  PT Problem List Decreased strength;Decreased mobility;Decreased activity tolerance;Decreased balance       PT Treatment Interventions DME instruction;Therapeutic exercise;Balance training;Gait training;Stair training;Neuromuscular re-education;Functional mobility training;Therapeutic activities;Patient/family education    PT Goals (Current goals can be found in the Care Plan section)  Acute Rehab PT Goals Patient Stated Goal: Return home with family PT Goal Formulation: With patient Time For Goal Achievement: 08/13/19 Potential to Achieve Goals: Good    Frequency Min 3X/week   Barriers to discharge        Co-evaluation               AM-PAC PT "6 Clicks" Mobility  Outcome Measure Help needed turning from your back to your side while in a flat bed without using bedrails?: None Help needed moving from lying on your back to sitting on the side of a flat bed without using bedrails?: A Little Help needed moving to and from a bed to a chair (including a wheelchair)?: A Little Help needed standing up from a chair using your arms (e.g., wheelchair or bedside chair)?: A Little Help needed to walk in hospital room?: A Little Help needed climbing 3-5 steps with a railing? : A Lot 6 Click Score: 18    End of Session Equipment Utilized During Treatment: Gait belt;Oxygen Activity Tolerance: Patient tolerated treatment well Patient left: in  chair;with call bell/phone within reach Nurse Communication: Mobility status PT Visit Diagnosis: Unsteadiness on feet (R26.81);Other abnormalities of gait and mobility (R26.89);Muscle weakness (generalized) (M62.81)    Time: [id number] PT Time Calculation (min) (ACUTE ONLY): 12 min   Charges:   PT Evaluation $PT Eval Moderate Complexity: 1 Mod          8:58 AM, 07/30/19 Mearl Latin PT, DPT Physical Therapist at [facility]

## 2019-07-31 ENCOUNTER — Other Ambulatory Visit (HOSPITAL_COMMUNITY): Payer: Medicare Other

## 2019-07-31 ENCOUNTER — Telehealth: Payer: Self-pay | Admitting: *Deleted

## 2019-07-31 ENCOUNTER — Other Ambulatory Visit: Payer: Self-pay

## 2019-07-31 ENCOUNTER — Encounter: Payer: Self-pay | Admitting: Cardiology

## 2019-07-31 ENCOUNTER — Ambulatory Visit (INDEPENDENT_AMBULATORY_CARE_PROVIDER_SITE_OTHER): Payer: Medicare Other | Admitting: Cardiology

## 2019-07-31 VITALS — BP 130/76 | HR 76 | Temp 98.6°F | Ht 69.0 in | Wt 286.0 lb

## 2019-07-31 DIAGNOSIS — Z794 Long term (current) use of insulin: Secondary | ICD-10-CM | POA: Diagnosis not present

## 2019-07-31 DIAGNOSIS — I3139 Other pericardial effusion (noninflammatory): Secondary | ICD-10-CM

## 2019-07-31 DIAGNOSIS — E118 Type 2 diabetes mellitus with unspecified complications: Secondary | ICD-10-CM

## 2019-07-31 DIAGNOSIS — I313 Pericardial effusion (noninflammatory): Secondary | ICD-10-CM | POA: Diagnosis not present

## 2019-07-31 DIAGNOSIS — I482 Chronic atrial fibrillation, unspecified: Secondary | ICD-10-CM

## 2019-07-31 NOTE — Telephone Encounter (Signed)
TRANSITIONAL CARE MANAGEMENT TELEPHONE OUTREACH NOTE   Contact Date: 07/31/2019 Contacted By: Zannie Cove, LPN   DISCHARGE INFORMATION Date of Discharge:07/30/2019 Discharge Facility: [facility] Discharge Diagnosis:Acute and chronic respiratory failure    Outpatient Follow Up Recommendations (copied from discharge summary) Recommendations for Outpatient Follow-up:  1. Follow up with PCP in 1 weeks 2. Follow up with pulmonologist in 1-2 weeks 3. Follow up with cardiologist as scheduled.  4. Discuss with pulmonologist about getting a prescription for revefenacin Maretta Bees) nebulizer solution  Justin Ruiz is a male primary care patient of Stacks, [name], MD. An outgoing telephone call was made today and I spoke with emergency contact, [name].  Justin Ruiz condition(s) and treatment(s) were discussed. An opportunity to ask questions was provided and all were answered or forwarded as appropriate.    ACTIVITIES OF DAILY LIVING  Justin Ruiz lives alone and he can perform ADLs independently. his primary caregiver is himself. he is able to depend on his primary caregiver(s) for consistent help. Transportation to appointments, to pick up medications, and to run errands is not a problem.  (Consider referral to [facility] if transportation or a consistent caregiver is a problem)   Fall Risk Fall Risk  07/04/2019 02/20/2019  Falls in the past year? 0 0  Number falls in past yr: 0 -  Injury with Fall? 0 -  Risk Factor Category  - -  Risk for fall due to : Impaired balance/gait;Impaired mobility -  Risk for fall due to: Comment - -  Follow up Falls evaluation completed -    high [place] Modifications/Assistive Devices Wheelchair: Yes Cane: No Ramp: No Bedside Toilet: No Hospital Bed:  No Other: [facility] he is not receiving home health Physical therapy and NA services.     MEDICATION RECONCILIATION  Justin Ruiz has been able to  pick-up all prescribed discharge medications from the pharmacy.   A post discharge medication reconciliation was performed and the complete medication list was reviewed with the patient/caregiver and is current as of 07/31/2019. Changes highlighted below.  Discontinued Medications STOP taking these medications       insulin NPH Human 100 UNIT/ML injection Commonly known as: NovoLIN N ReliOn   metoprolol tartrate 100 MG tablet Commonly known as: LOPRESSOR   Trelegy Ellipta 100-62.5-25 MCG/INH Aepb Generic drug: Fluticasone-Umeclidin-Vilant     Current Medication List Allergies as of 07/31/2019      Reactions   Avapro [irbesartan] Other (See Comments)   "doesnt sit right with me"--light headed   Lipitor [atorvastatin Calcium] Other (See Comments)   Leg pain      Medication List       Accurate as of July 31, 2019  8:52 AM. If you have any questions, ask your nurse or doctor.        acetaminophen 325 MG tablet Commonly known as: TYLENOL Take 650 mg by mouth every 6 (six) hours as needed.   albuterol 108 (90 Base) MCG/ACT inhaler Commonly known as: VENTOLIN HFA Inhale 2 puffs into the lungs every 4 (four) hours as needed for wheezing or shortness of breath.   bisoprolol 5 MG tablet Commonly known as: ZEBETA Take 1 tablet (5 mg total) by mouth 2 (two) times daily.   budesonide-formoterol 160-4.5 MCG/ACT inhaler Commonly known as: SYMBICORT Inhale 2 puffs into the lungs 2 (two) times daily.   Eliquis 5 MG Tabs tablet Generic drug: apixaban Take 1 tablet by mouth  twice daily   furosemide 40 MG tablet Commonly known as: LASIX Take 1-2 tablets (40-80 mg total) by mouth 2 (two) times daily. Patient takes 80mg  in the morning and 40mg  in the evening What changed:   how much to take  additional instructions   insulin regular 100 units/mL injection Commonly known as: NOVOLIN R Inject 0.08 mLs (8 Units total) into the skin 3 (three) times daily before meals.     ipratropium-albuterol 0.5-2.5 (3) MG/3ML Soln Commonly known as: DUONEB Take 3 mLs by nebulization every 6 (six) hours as needed.   nystatin powder Commonly known as: MYCOSTATIN/NYSTOP Apply topically 2 (two) times daily. Apply to groin area and scrotum skin; keep area clean and dry.   oxymetazoline 0.05 % nasal spray Commonly known as: AFRIN Place 1 spray into both nostrils 2 (two) times daily as needed for congestion.   Ozempic (1 MG/DOSE) 2 MG/1.5ML Sopn Generic drug: Semaglutide (1 MG/DOSE) Inject 1 mg into the skin once a week.   PARoxetine 20 MG tablet Commonly known as: PAXIL Take 1 tablet by mouth once daily   simvastatin 40 MG tablet Commonly known as: ZOCOR Take 1 tablet by mouth once daily   Vitamin D3 125 MCG (5000 UT) Tabs Take 1 tablet by mouth every morning.        PATIENT EDUCATION & FOLLOW-UP PLAN  An appointment for Transitional Care Management is scheduled with [name], MD on 08/08/19 at 1:10pm.  Take all medications as prescribed  Contact our office by calling [phone] if you have any questions or concerns.

## 2019-07-31 NOTE — Patient Instructions (Signed)
Medication Instructions:  The current medical regimen is effective;  continue present plan and medications.  *If you need a refill on your cardiac medications before your next appointment, please call your pharmacy*  Follow-Up: At [facility], you and your health needs are our priority.  As part of our continuing mission to provide you with exceptional heart care, we have created designated Provider Care Teams.  These Care Teams include your primary Cardiologist (physician) and Advanced Practice Providers (APPs -  Physician Assistants and Nurse Practitioners) who all work together to provide you with the care you need, when you need it.  We recommend signing up for the patient portal called "MyChart".  Sign up information is provided on this After Visit Summary.  MyChart is used to connect with patients for Virtual Visits (Telemedicine).  Patients are able to view lab/test results, encounter notes, upcoming appointments, etc.  Non-urgent messages can be sent to your provider as well.   To learn more about what you can do with MyChart, go to [email or web address].    Your next appointment:   3 month(s)  The format for your next appointment:   In Person  Provider:   Minus Breeding, MD   Thank you for choosing [facility]!!

## 2019-08-02 ENCOUNTER — Ambulatory Visit: Payer: Self-pay | Admitting: Pharmacist

## 2019-08-02 LAB — CULTURE, BLOOD (ROUTINE X 2)
Culture: NO GROWTH
Culture: NO GROWTH
Special Requests: ADEQUATE
Special Requests: ADEQUATE

## 2019-08-02 LAB — ALPHA-1 ANTITRYPSIN PHENOTYPE: A-1 Antitrypsin, Ser: 196 mg/dL — ABNORMAL HIGH (ref 101–187)

## 2019-08-04 DIAGNOSIS — E1151 Type 2 diabetes mellitus with diabetic peripheral angiopathy without gangrene: Secondary | ICD-10-CM | POA: Diagnosis not present

## 2019-08-04 DIAGNOSIS — L03116 Cellulitis of left lower limb: Secondary | ICD-10-CM | POA: Diagnosis not present

## 2019-08-04 DIAGNOSIS — Z794 Long term (current) use of insulin: Secondary | ICD-10-CM | POA: Diagnosis not present

## 2019-08-04 DIAGNOSIS — J9621 Acute and chronic respiratory failure with hypoxia: Secondary | ICD-10-CM | POA: Diagnosis not present

## 2019-08-04 DIAGNOSIS — D472 Monoclonal gammopathy: Secondary | ICD-10-CM | POA: Diagnosis not present

## 2019-08-04 DIAGNOSIS — D696 Thrombocytopenia, unspecified: Secondary | ICD-10-CM | POA: Diagnosis not present

## 2019-08-04 DIAGNOSIS — Z7951 Long term (current) use of inhaled steroids: Secondary | ICD-10-CM | POA: Diagnosis not present

## 2019-08-04 DIAGNOSIS — I110 Hypertensive heart disease with heart failure: Secondary | ICD-10-CM | POA: Diagnosis not present

## 2019-08-04 DIAGNOSIS — J449 Chronic obstructive pulmonary disease, unspecified: Secondary | ICD-10-CM | POA: Diagnosis not present

## 2019-08-04 DIAGNOSIS — Z7901 Long term (current) use of anticoagulants: Secondary | ICD-10-CM | POA: Diagnosis not present

## 2019-08-04 DIAGNOSIS — R911 Solitary pulmonary nodule: Secondary | ICD-10-CM | POA: Diagnosis not present

## 2019-08-04 DIAGNOSIS — L03115 Cellulitis of right lower limb: Secondary | ICD-10-CM | POA: Diagnosis not present

## 2019-08-04 DIAGNOSIS — K219 Gastro-esophageal reflux disease without esophagitis: Secondary | ICD-10-CM | POA: Diagnosis not present

## 2019-08-04 DIAGNOSIS — I872 Venous insufficiency (chronic) (peripheral): Secondary | ICD-10-CM | POA: Diagnosis not present

## 2019-08-04 DIAGNOSIS — E11319 Type 2 diabetes mellitus with unspecified diabetic retinopathy without macular edema: Secondary | ICD-10-CM | POA: Diagnosis not present

## 2019-08-04 DIAGNOSIS — E1140 Type 2 diabetes mellitus with diabetic neuropathy, unspecified: Secondary | ICD-10-CM | POA: Diagnosis not present

## 2019-08-04 DIAGNOSIS — I5033 Acute on chronic diastolic (congestive) heart failure: Secondary | ICD-10-CM | POA: Diagnosis not present

## 2019-08-04 DIAGNOSIS — E1165 Type 2 diabetes mellitus with hyperglycemia: Secondary | ICD-10-CM | POA: Diagnosis not present

## 2019-08-04 DIAGNOSIS — M1990 Unspecified osteoarthritis, unspecified site: Secondary | ICD-10-CM | POA: Diagnosis not present

## 2019-08-04 DIAGNOSIS — G4733 Obstructive sleep apnea (adult) (pediatric): Secondary | ICD-10-CM | POA: Diagnosis not present

## 2019-08-04 DIAGNOSIS — E785 Hyperlipidemia, unspecified: Secondary | ICD-10-CM | POA: Diagnosis not present

## 2019-08-04 DIAGNOSIS — I482 Chronic atrial fibrillation, unspecified: Secondary | ICD-10-CM | POA: Diagnosis not present

## 2019-08-05 ENCOUNTER — Ambulatory Visit: Payer: Self-pay | Admitting: Pharmacist

## 2019-08-05 DIAGNOSIS — I872 Venous insufficiency (chronic) (peripheral): Secondary | ICD-10-CM | POA: Diagnosis not present

## 2019-08-05 DIAGNOSIS — M6281 Muscle weakness (generalized): Secondary | ICD-10-CM | POA: Diagnosis not present

## 2019-08-05 DIAGNOSIS — J9611 Chronic respiratory failure with hypoxia: Secondary | ICD-10-CM | POA: Diagnosis not present

## 2019-08-05 DIAGNOSIS — I509 Heart failure, unspecified: Secondary | ICD-10-CM | POA: Diagnosis not present

## 2019-08-06 ENCOUNTER — Encounter: Payer: Self-pay | Admitting: Primary Care

## 2019-08-06 ENCOUNTER — Ambulatory Visit: Payer: Medicare Other | Admitting: Primary Care

## 2019-08-06 ENCOUNTER — Other Ambulatory Visit: Payer: Self-pay

## 2019-08-06 DIAGNOSIS — J449 Chronic obstructive pulmonary disease, unspecified: Secondary | ICD-10-CM

## 2019-08-06 DIAGNOSIS — G4733 Obstructive sleep apnea (adult) (pediatric): Secondary | ICD-10-CM | POA: Diagnosis not present

## 2019-08-06 NOTE — Assessment & Plan Note (Addendum)
-   Former smoker. FEV1 1.02 (28%), ratio 50 - Recent hospitalization, consulted in-patient by Dr. Melvyn Novas with pulmonary. Recommended changing DPI to Pulmicort/Brovana and Yupelri. Patient was discharged on Symbicort with option of adding yupelri. Patient has not started Symbicort.  - Recommending pharmacy consult for medication management. Would like patient to be on ICS/LABA LABA nebulizer combination.  - For now patient will continue Trelegy one puff daily as he has plently of this on hand at home until we can get medications sorted out  - FU next available with Dr. Annamaria Boots Consider adding either daliresp or daily azithromycin to decrease COPD exacerbations and re-admissions

## 2019-08-06 NOTE — Progress Notes (Signed)
@Patient  ID: Justin Ruiz, male    DOB: Oct 09, 1950, 68 y.o.   MRN: [id number]  Chief Complaint  Patient presents with  . Hospitalization Follow-up    Referring provider: [name], MD  HPI: 69 year old male, former smoker.  Significant for COPD stage III, obstructive sleep apnea, seasonal allergic rhinitis, acute on chronic respiratory failure, hypertension, mediastinal adenopathy, history of non-hodgkin's lymphoma, A. Fib (on Eliquis), chronic diastolic heart failure, obesity, GERD, benign neoplasm of ascending colon, diabetes mellitus type 2.  Patient of Dr. Annamaria Boots, last seen May 06, 2019.  He is on auto CPAP 12-20.  DME company is [employer].  Maintained on Trelegy, DuoNeb's and as needed albuterol HFA.  Oxygen Dependent 3-4 L. Several hospitalizations, most recent in April for acute on chronic respiratory failure with hypoxemia.   Hospital course 07/28/19-07/30/19: Presented to ED with complaints of difficulty breathing over the past month.  Previous day reports weakness and blood sugar was 24 EMS was called but patient declined transfer to hospital.  Patient is on insulin and reduced dose.  In ED systolic blood pressure was 87-112.  O2 was 97% on 2 to 3 L.  Blood sugar was 47.  BNP was 243.  Rest x-ray showed right base atelectasis with small right pleural effusion.  No airspace consolidation.  Concern for sepsis.  IV Vanco and cefepime given with 2.2 L bolus.  Acute on chronic respiratory failure felt to be multifactorial.  Seen inpatient by pulmonary.  Started on bisoprolol 5 mg twice daily.  Recommended patient try Pulmicort/Brovana and Yupelri if available.  History of OSA patient report did present mask helping while in hospital so suggested patient take at home.  Ports compliance with Lasix and Eliquis.  Hospitalization-3/15-3/17-also presented with difficulty breathing, CTA chest negative for PE showed small pleural effusions and a large pericardial effusion. Subsequent  echocardiogram 3/16 -moderate-sized pericardial effusion without tamponade. Recommendation was for repeat echocardiogram in 2 to 3 weeks. He was discharged home on 3 L. Follow-up limited echocardiogram 4/1 -resolved pericardial effusion.  Hospitalization-3/2-3/5-for acute on chronic diastolic CHF with acute on chronic respiratory failure.  08/06/2019  Patient presents today for hospital follow-up. During most recent hospital admission Dr. Melvyn Novas with pulmonary had recommended changing patient to Pulmicort/Brovana and Maretta Bees if available. Patient was discharged on Symbicort with the possibility of adding Yupelri. Patient has not picked Symbicort up from pharmacy. Prior to this patient was on Trelegy. He is not currently using CPAP at home, there was mention of him bring home mask from hospital but this unfortunately did not happen. Wife is unsure if they even have CPAP at home or where it is. He was able to tolerate using it while in hospital.   Allergies  Allergen Reactions  . Avapro [Irbesartan] Other (See Comments)    "doesnt sit right with me"--light headed  . Lipitor [Atorvastatin Calcium] Other (See Comments)    Leg pain    Immunization History  Administered Date(s) Administered  . Fluad Quad(high Dose 65+) 01/21/2019  . Influenza Split 01/09/2012  . Influenza Whole 01/23/2009, 01/24/2011  . Influenza, High Dose Seasonal PF 02/01/2016, 01/24/2017, 01/29/2018  . Influenza,inj,Quad PF,6+ Mos 01/30/2013, 01/24/2014, 01/21/2015  . Pneumococcal Conjugate-13 02/20/2014  . Pneumococcal Polysaccharide-23 02/24/2011  . Td 04/25/2004  . Tdap 09/24/2010    Past Medical History:  Diagnosis Date  . Acute diastolic heart failure ([place]) 03/28/2016  . Acute encephalopathy 03/28/2016  . Acute encephalopathy 03/28/2016  . Acute on chronic respiratory failure with hypoxia ([place]) 12/04/2016  .  Arthritis   . Asthma   . Atrial fibrillation ([place])    on AC  . Benign neoplasm of ascending colon  05/05/2014  . BPH (benign prostatic hypertrophy)   . CARDIOVASCULAR STUDIES, ABNORMAL 04/27/2009   Qualifier: Diagnosis of  By: Percival Spanish, MD, Farrel Gordon    . Chronic anticoagulation 08/20/2012  . Chronic respiratory failure with hypoxia ([place]) 11/10/2017  . Colon polyps   . COPD mixed type ([place]) 03/28/2016   PFT 06/17/14- severe obstructive airways disease with slight response to bronchodilator. FVC 2.03/42%, FEV1 1.02/28%, ratio 0.5, TLC 82%, DLCO 59%  . Diabetes mellitus   . Diabetic neuropathy, type II diabetes mellitus ([place]) 09/25/2014  . Diabetic retinopathy ([place]) 05/28/2017  . 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 GERD (gastroesophageal reflux disease)   . HTN (hypertension)    x 3 years  . Hyperlipidemia   . Lung nodules 03/11/2013  . Mediastinal adenopathy 06/20/2013   CT  & PET 2/15   . Memory loss   . 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 NHL (non-Hodgkin's lymphoma) ([place])    nhl dx 9/11  . Obesity    exogenous  . Obstructive sleep apnea 05/25/2014   NPSG-  04/07/2014-severe obstructive sleep apnea-AHI 75.1 per hour. CPAP titrated to 16. He required supplemental oxygen at 2 L/ [employer] which he already wears for sleep at home. Weight 338 pounds   . Peripheral vascular insufficiency ([place]) 08/02/2017  . Personal history of colonic polyps 03/02/2009   Qualifier: Diagnosis of  By: Deatra Ina MD, Sandy Salaam   . Sepsis ([place]) 06/07/2015  . Severe obesity (BMI >= 40) ([place]) 05/01/2013  . Shingles   . Sleep apnea    CPAP machine is broken  . Thrombocytopenia ([place]) 11/23/2016    Tobacco History: Social History   Tobacco Use  Smoking Status Former Smoker  . Packs/day: 1.00  . Years: 40.00  . Pack years: 40.00  . Types: Cigarettes  . Start date: 12/17/1968  . Quit date: 10/18/2018  . Years since quitting: 0.8  Smokeless Tobacco Never Used  Tobacco Comment   2 ppd; pt reports smoking 2-3 weeks ago, 5-6 cigs/daily   Counseling  given: Not Answered Comment: 2 ppd; pt reports smoking 2-3 weeks ago, 5-6 cigs/daily   Outpatient Medications Prior to Visit  Medication Sig Dispense Refill  . acetaminophen (TYLENOL) 325 MG tablet Take 650 mg by mouth every 6 (six) hours as needed.    Marland Kitchen albuterol (VENTOLIN HFA) 108 (90 Base) MCG/ACT inhaler Inhale 2 puffs into the lungs every 4 (four) hours as needed for wheezing or shortness of breath.    . bisoprolol (ZEBETA) 5 MG tablet Take 1 tablet (5 mg total) by mouth 2 (two) times daily. 60 tablet 0  . Cholecalciferol (VITAMIN D3) 5000 UNITS TABS Take 1 tablet by mouth every morning.    Marland Kitchen ELIQUIS 5 MG TABS tablet Take 1 tablet by mouth twice daily 60 tablet 2  . furosemide (LASIX) 40 MG tablet Take 1-2 tablets (40-80 mg total) by mouth 2 (two) times daily. Patient takes 80mg  in the morning and 40mg  in the evening (Patient taking differently: 40 mg. Patient takes 80 mg in the morning and 40 mg in the evening)    . insulin regular (NOVOLIN R) 100 units/mL injection Inject 0.08 mLs (8 Units total) into the skin 3 (three) times daily before meals. 10 mL 5  . nystatin (MYCOSTATIN/NYSTOP) powder Apply topically 2 (two) times daily.  Apply to groin area and scrotum skin; keep area clean and dry. 15 g 0  . oxymetazoline (AFRIN) 0.05 % nasal spray Place 1 spray into both nostrils 2 (two) times daily as needed for congestion.    Marland Kitchen PARoxetine (PAXIL) 20 MG tablet Take 1 tablet by mouth once daily 90 tablet 0  . Semaglutide, 1 MG/DOSE, (OZEMPIC, 1 MG/DOSE,) 2 MG/1.5ML SOPN Inject 1 mg into the skin once a week. 2 pen 11  . simvastatin (ZOCOR) 40 MG tablet Take 1 tablet by mouth once daily 90 tablet 1  . budesonide-formoterol (SYMBICORT) 160-4.5 MCG/ACT inhaler Inhale 2 puffs into the lungs 2 (two) times daily. 1 Inhaler 11  . ipratropium-albuterol (DUONEB) 0.5-2.5 (3) MG/3ML SOLN Take 3 mLs by nebulization every 6 (six) hours as needed. 1080 mL 3   No facility-administered medications prior to  visit.    Review of System  Review of Systems  Respiratory: Positive for shortness of breath. Negative for cough and wheezing.   Psychiatric/Behavioral: The patient is nervous/anxious.    Physical Exam  BP 120/60 (BP Location: Left Arm, Cuff Size: Large)   Pulse 91   Temp (!) 97.2 F (36.2 C) (Temporal)   Ht 5\' 9"  (1.753 m)   Wt 278 lb (126.1 kg)   SpO2 100%   BMI 41.05 kg/m  Physical Exam Constitutional:      Appearance: Normal appearance. He is obese.  HENT:     Head: Normocephalic and atraumatic.     Mouth/Throat:     Mouth: Mucous membranes are moist.     Pharynx: Oropharynx is clear.  Cardiovascular:     Rate and Rhythm: Normal rate and regular rhythm.  Pulmonary:     Breath sounds: No wheezing.     Comments: LS diminished; dyspnea with speaking Abdominal:     General: There is distension.  Skin:    General: Skin is warm and dry.  Neurological:     Mental Status: He is alert. Mental status is at baseline.  Psychiatric:        Behavior: Behavior normal.      Lab Results:  CBC    Component Value Date/Time   WBC 6.3 07/30/2019 0432   RBC 3.79 (L) 07/30/2019 0432   HGB 10.9 (L) 07/30/2019 0432   HGB 12.1 (L) 07/04/2019 1408   HGB 15.2 05/19/2016 0933   HCT 35.8 (L) 07/30/2019 0432   HCT 36.6 (L) 07/04/2019 1408   HCT 44.9 05/19/2016 0933   PLT 192 07/30/2019 0432   PLT 301 07/04/2019 1408   MCV 94.5 07/30/2019 0432   MCV 92 07/04/2019 1408   MCV 87.2 05/19/2016 0933   MCH 28.8 07/30/2019 0432   MCHC 30.4 07/30/2019 0432   RDW 13.5 07/30/2019 0432   RDW 12.9 07/04/2019 1408   RDW 14.2 05/19/2016 0933   LYMPHSABS 1.1 07/28/2019 1227   LYMPHSABS 1.3 07/04/2019 1408   LYMPHSABS 2.1 05/19/2016 0933   MONOABS 0.5 07/28/2019 1227   MONOABS 0.8 05/19/2016 0933   EOSABS 0.1 07/28/2019 1227   EOSABS 0.1 07/04/2019 1408   BASOSABS 0.0 07/28/2019 1227   BASOSABS 0.0 07/04/2019 1408   BASOSABS 0.0 05/19/2016 0933    BMET    Component Value  Date/Time   NA 139 07/30/2019 0432   NA 140 07/04/2019 1408   NA 139 05/19/2016 0933   K 4.9 07/30/2019 0432   K 4.0 05/19/2016 0933   CL 100 07/30/2019 0432   CL 100 09/03/2012 0751   CO2  32 07/30/2019 0432   CO2 32 (H) 05/19/2016 0933   GLUCOSE 168 (H) 07/30/2019 0432   GLUCOSE 128 05/19/2016 0933   GLUCOSE 202 (H) 09/03/2012 0751   BUN 22 07/30/2019 0432   BUN 23 07/04/2019 1408   BUN 25.3 05/19/2016 0933   CREATININE 1.02 07/30/2019 0432   CREATININE 1.27 (H) 06/21/2018 0950   CREATININE 1.2 05/19/2016 0933   CALCIUM 9.1 07/30/2019 0432   CALCIUM 10.4 05/19/2016 0933   GFRNONAA >60 07/30/2019 0432   GFRNONAA 58 (L) 06/21/2018 0950   GFRNONAA >89 11/14/2012 0918   GFRAA >60 07/30/2019 0432   GFRAA >60 06/21/2018 0950   GFRAA >89 11/14/2012 0918    BNP    Component Value Date/Time   BNP 243.0 (H) 07/28/2019 1227    ProBNP    Component Value Date/Time   PROBNP 186.0 (H) 01/09/2012 1059    Imaging: CT Angio Chest PE W/Cm &/Or Wo Cm  Result Date: 07/08/2019 CLINICAL DATA:  Shortness of breath EXAM: CT ANGIOGRAPHY CHEST WITH CONTRAST TECHNIQUE: Multidetector CT imaging of the chest was performed using the standard protocol during bolus administration of intravenous contrast. Multiplanar CT image reconstructions and MIPs were obtained to evaluate the vascular anatomy. CONTRAST:  155mL OMNIPAQUE IOHEXOL 350 MG/ML SOLN COMPARISON:  CTA chest 11/08/2018 FINDINGS: Cardiovascular: Contrast injection is sufficient to demonstrate satisfactory opacification of the pulmonary arteries to the segmental level. There is no pulmonary embolus or evidence of right heart strain. The size of the main pulmonary artery is normal. There is a large pericardial effusion that measures 2.7 cm thickness, new since the prior scan of 11/08/2018. The course and caliber of the aorta are normal. There is mild atherosclerotic calcification. Opacification decreased due to pulmonary arterial phase contrast  bolus timing. Mediastinum/Nodes: Multiple subcentimeter mediastinal lymph nodes. Lungs/Pleura: There are small pleural effusions, slightly larger on the right. Bibasilar atelectasis. Airways are patent. Upper Abdomen: Contrast bolus timing is not optimized for evaluation of the abdominal organs. There is cholelithiasis. No acute findings. Musculoskeletal: No chest wall abnormality. No bony spinal canal stenosis. Review of the MIP images confirms the above findings. IMPRESSION: 1. No pulmonary embolus. 2. Large pericardial effusion, new since the prior scan. 3. Small pleural effusions with associated bibasilar atelectasis. Electronically Signed   By: Ulyses Jarred M.D.   On: 07/08/2019 19:18   DG Chest Port 1 View  Result Date: 07/28/2019 CLINICAL DATA:  Shortness of breath EXAM: PORTABLE CHEST 1 VIEW COMPARISON:  July 08, 2019 FINDINGS: There is a small right pleural effusion. There is mild right base atelectasis. The lungs elsewhere are clear. Heart is mildly enlarged with pulmonary vascularity normal. There is aortic atherosclerosis. No bone lesions. IMPRESSION: Home right base atelectasis with small right pleural effusion. No airspace consolidation. Stable cardiac prominence. Electronically Signed   By: Lowella Grip III M.D.   On: 07/28/2019 12:56   DG Chest Port 1 View  Result Date: 07/08/2019 CLINICAL DATA:  Shortness of breath. EXAM: PORTABLE CHEST 1 VIEW COMPARISON:  July 04, 2019. FINDINGS: Stable cardiomegaly. Left lung base is not entirely included in field-of-view. Bibasilar opacities are noted concerning for atelectasis or infiltrates with possible effusions. No pneumothorax is noted. Bony thorax is unremarkable. IMPRESSION: Bibasilar opacities are noted concerning for atelectasis or infiltrates with possible effusions. Exam is limited as left lung base is not entirely included in field-of-view. Electronically Signed   By: Marijo Conception M.D.   On: 07/08/2019 16:37   ECHOCARDIOGRAM  LIMITED  Result  Date: 07/29/2019    ECHOCARDIOGRAM LIMITED REPORT   Patient Name:   Justin Ruiz Date of Exam: 07/29/2019 Medical Rec #:  [id number]      Height:       69.0 in Accession #:    [id number]     Weight:       297.6 lb Date of Birth:  Sep 21, 1950      BSA:          2.446 m Patient Age:    44 years       BP:           100/53 mmHg Patient Gender: M              HR:           57 bpm. Exam Location:  [facility] Procedure: 2D Echo and Cardiac Doppler Indications:    Recent pericardial effusion spontaneously resolved. Presenting                 again with increasing dyspnea and hypotension.  History:        Patient has prior history of Echocardiogram examinations, most                 recent 07/25/2019. COPD, Arrythmias:Atrial Fibrillation; Risk                 Factors:Diabetes, Hypertension and Dyslipidemia. Chronic                 diastolic heart failure,Obesity, morbid,Obstructive sleep apnea,                 h/o Effusion, NHL (non-Hodgkin's lymphoma.  Sonographer:    Alvino Chapel RCS Referring Phys: [phone] [place]  1. Left ventricular ejection fraction, by estimation, is 60 to 65%. The left ventricle has normal function. The left ventricle has no regional wall motion abnormalities.  2. Right ventricular systolic function is normal. The right ventricular size is mildly enlarged.  3. The inferior vena cava is normal in size with greater than 50% respiratory variability, suggesting right atrial pressure of 3 mmHg.  4. Limited echo FINDINGS  Left Ventricle: Left ventricular ejection fraction, by estimation, is 60 to 65%. The left ventricle has normal function. The left ventricle has no regional wall motion abnormalities. The left ventricular internal cavity size was normal in size. There is  no left ventricular hypertrophy. Right Ventricle: The right ventricular size is mildly enlarged. Right ventricular systolic function is normal. Pericardium: There is no evidence of pericardial effusion.  Venous: The inferior vena cava is normal in size with greater than 50% respiratory variability, suggesting right atrial pressure of 3 mmHg. Carlyle Dolly MD Electronically signed by Carlyle Dolly MD Signature Date/Time: 07/29/2019/3:56:49 PM    Final    ECHOCARDIOGRAM LIMITED  Result Date: 07/25/2019    ECHOCARDIOGRAM LIMITED REPORT   Patient Name:   Justin Ruiz Date of Exam: 07/25/2019 Medical Rec #:  [id number]      Height:       69.0 in Accession #:    [id number]     Weight:       298.9 lb Date of Birth:  09-04-1950      BSA:          2.451 m Patient Age:    6 years       BP:           96/63 mmHg Patient Gender: M  HR:           91 bpm. Exam Location:  [facility] Procedure: Limited Echo, Cardiac Doppler and Limited Color Doppler Indications:    I31.3 (ICD-10-CM) - Pericardial effusion follow up  History:        Patient has prior history of Echocardiogram examinations, most                 recent 07/09/2019.  Sonographer:    Alvino Chapel RCS Referring Phys: [id number] [place]  1. Left ventricular ejection fraction, by estimation, is 60 to 65%. The left ventricle has normal function. The left ventricle has no regional wall motion abnormalities.  2. The right ventricular size is mildly enlarged.  3. Right atrial size was mildly dilated.  4. Pericardial effusion seen on most recent echocardiogram has resolved.  5. The mitral valve is degenerative. No evidence of mitral valve regurgitation.  6. Aortic valve regurgitation is not visualized.  7. The inferior vena cava is normal in size with greater than 50% respiratory variability, suggesting right atrial pressure of 3 mmHg. FINDINGS  Left Ventricle: Left ventricular ejection fraction, by estimation, is 60 to 65%. The left ventricle has normal function. The left ventricle has no regional wall motion abnormalities. The left ventricular internal cavity size was normal in size. There is  borderline concentric left ventricular  hypertrophy. Right Ventricle: The right ventricular size is mildly enlarged. No increase in right ventricular wall thickness. Left Atrium: Left atrial size was normal in size. Right Atrium: Right atrial size was mildly dilated. Pericardium: Pericardial effusion seen on most recent echocardiogram has resolved. There is no evidence of pericardial effusion. Mitral Valve: The mitral valve is degenerative in appearance. Mild mitral annular calcification. Tricuspid Valve: The tricuspid valve is grossly normal. Tricuspid valve regurgitation is mild. Aortic Valve: Aortic valve regurgitation is not visualized. There is mild thickening of the aortic valve. Aorta: The aortic root is normal in size and structure. Venous: The inferior vena cava is normal in size with greater than 50% respiratory variability, suggesting right atrial pressure of 3 mmHg. IAS/Shunts: No atrial level shunt detected by color flow Doppler.  LEFT VENTRICLE PLAX 2D LVIDd:         5.39 cm LVIDs:         3.47 cm LV PW:         1.03 cm LV IVS:        1.04 cm LVOT diam:     2.40 cm LVOT Area:     4.52 cm  LEFT ATRIUM         Index LA diam:    4.30 cm 1.75 cm/m   AORTA Ao Root diam: 3.40 cm  SHUNTS Systemic Diam: 2.40 cm Kate Sable MD Electronically signed by Kate Sable MD Signature Date/Time: 07/25/2019/2:41:52 PM    Final    ECHOCARDIOGRAM LIMITED  Result Date: 07/09/2019    ECHOCARDIOGRAM LIMITED REPORT   Patient Name:   Justin Ruiz Date of Exam: 07/09/2019 Medical Rec #:  [id number]      Height:       69.0 in Accession #:    [id number]     Weight:       296.3 lb Date of Birth:  18-Mar-1951      BSA:          2.442 m Patient Age:    34 years       BP:           105/78  mmHg Patient Gender: M              HR:           81 bpm. Exam Location:  [facility] Procedure: Limited Echo and Saline Contrast Bubble Study Indications:    Pericardial effusion 423.9 / I31.3  History:        Patient has prior history of Echocardiogram examinations, most                  recent 06/26/2019. CHF, COPD, Arrythmias:Atrial Fibrillation; Risk                 Factors:Diabetes, Hypertension and Dyslipidemia. Pericardial                 Effusion, NHL (non-Hodgkin's lymphoma.  Sonographer:    Leavy Cella RDCS (AE) Referring Phys: [id number] [place]  1. Left ventricular ejection fraction, by estimation, is 55 to 60%. The left ventricle has normal function. The left ventricle has no regional wall motion abnormalities. There is mild concentric left ventricular hypertrophy. Left ventricular diastolic parameters are indeterminate.  2. Right ventricular systolic function is mildly reduced. The right ventricular size is mildly enlarged.  3. Left atrial size was mildly dilated.  4. Right atrial size was mildly dilated.  5. Moderate pericardial effusion. The pericardial effusion is circumferential. There is no evidence of cardiac tamponade.  6. The mitral valve is degenerative. No evidence of mitral valve regurgitation.  7. The aortic valve was not well visualized. No aortic stenosis is present.  8. The inferior vena cava is normal in size with greater than 50% respiratory variability, suggesting right atrial pressure of 3 mmHg. FINDINGS  Left Ventricle: Left ventricular ejection fraction, by estimation, is 55 to 60%. The left ventricle has normal function. The left ventricle has no regional wall motion abnormalities. The left ventricular internal cavity size was normal in size. There is  mild concentric left ventricular hypertrophy. Right Ventricle: The right ventricular size is mildly enlarged. Right ventricular systolic function is mildly reduced. Left Atrium: Left atrial size was mildly dilated. Right Atrium: Right atrial size was mildly dilated. Pericardium: A moderately sized pericardial effusion is present. The pericardial effusion is circumferential. There is no evidence of cardiac tamponade. Mitral Valve: The mitral valve is degenerative in appearance. Mild  mitral annular calcification. Aortic Valve: The aortic valve was not well visualized. . There is mild thickening and mild calcification of the aortic valve. No aortic stenosis is present. Mild aortic valve annular calcification. There is mild thickening of the aortic valve. There is mild calcification of the aortic valve. Pulmonic Valve: The pulmonic valve was not well visualized. Pulmonic valve regurgitation is not visualized. Aorta: The aortic root is normal in size and structure. Venous: The inferior vena cava is normal in size with greater than 50% respiratory variability, suggesting right atrial pressure of 3 mmHg. IAS/Shunts: No atrial level shunt detected by color flow Doppler. Agitated saline contrast was given intravenously to evaluate for intracardiac shunting.  LEFT VENTRICLE PLAX 2D LVIDd:         4.87 cm LVIDs:         3.77 cm LV PW:         1.02 cm LV IVS:        1.23 cm LVOT diam:     2.00 cm LVOT Area:     3.14 cm  LEFT ATRIUM         Index LA diam:  4.70 cm 1.92 cm/m   AORTA Ao Root diam: 3.40 cm MITRAL VALVE MV Area (PHT): 4.31 cm     SHUNTS MV Decel Time: 176 msec     Systemic Diam: 2.00 cm MV E velocity: 109.00 cm/s MV A velocity: 37.30 cm/s MV E/A ratio:  2.92 Kate Sable MD Electronically signed by Kate Sable MD Signature Date/Time: 07/09/2019/10:57:31 AM    Final      Assessment & Plan:   COPD GOLD III - Former smoker. FEV1 1.02 (28%), ratio 50 - Recent hospitalization, consulted in-patient by Dr. Melvyn Novas with pulmonary. Recommended changing DPI to Pulmicort/Brovana and Yupelri. Patient was discharged on Symbicort with option of adding yupelri. Patient has not started Symbicort.  - Recommending pharmacy consult for medication management. Would like patient to be on ICS/LABA LABA nebulizer combination.  - For now patient will continue Trelegy one puff daily as he has plently of this on hand at home until we can get medications sorted out  - FU next available with Dr.  Annamaria Boots Consider adding either daliresp or daily azithromycin to decrease COPD exacerbations and re-admissions  Obstructive sleep apnea - Severe obstructive sleep apnea, not currently using CPAP at home (family unsure location) - Compliant with nocturnal oxygen  - He was able to tolerate CPAP during most recent hospitalization  - Patient is not up to repeat sleep study at this time. Recommend PSG when able, may need new order for CPAP device, mask with [facility]    Martyn Ehrich, NP 08/06/2019

## 2019-08-06 NOTE — Assessment & Plan Note (Signed)
-   Severe obstructive sleep apnea, not currently using CPAP at home (family unsure location) - Compliant with nocturnal oxygen  - He was able to tolerate CPAP during most recent hospitalization  - Patient is not up to repeat sleep study at this time. Recommend PSG when able, may need new order for CPAP device, mask with APRIA

## 2019-08-06 NOTE — Patient Instructions (Addendum)
Recommendations: Use Trelegy 1 puff daily until seen by pharmacy for consult  Use Albuterol every 4-6 hours for shortness of breath or wheezing   Consult: Pharmacy (please bring in all medications and nebulizer/inhalers with you to appointment)  Follow-up: Next available Dr. Annamaria Boots to discuss resuming CPAP

## 2019-08-07 ENCOUNTER — Other Ambulatory Visit: Payer: Self-pay | Admitting: Pharmacy Technician

## 2019-08-07 ENCOUNTER — Ambulatory Visit: Payer: Self-pay | Admitting: Pharmacist

## 2019-08-07 DIAGNOSIS — I5033 Acute on chronic diastolic (congestive) heart failure: Secondary | ICD-10-CM | POA: Diagnosis not present

## 2019-08-07 DIAGNOSIS — G4733 Obstructive sleep apnea (adult) (pediatric): Secondary | ICD-10-CM | POA: Diagnosis not present

## 2019-08-07 DIAGNOSIS — D472 Monoclonal gammopathy: Secondary | ICD-10-CM | POA: Diagnosis not present

## 2019-08-07 DIAGNOSIS — J9621 Acute and chronic respiratory failure with hypoxia: Secondary | ICD-10-CM | POA: Diagnosis not present

## 2019-08-07 DIAGNOSIS — Z7951 Long term (current) use of inhaled steroids: Secondary | ICD-10-CM | POA: Diagnosis not present

## 2019-08-07 DIAGNOSIS — I872 Venous insufficiency (chronic) (peripheral): Secondary | ICD-10-CM | POA: Diagnosis not present

## 2019-08-07 DIAGNOSIS — I110 Hypertensive heart disease with heart failure: Secondary | ICD-10-CM | POA: Diagnosis not present

## 2019-08-07 DIAGNOSIS — Z794 Long term (current) use of insulin: Secondary | ICD-10-CM | POA: Diagnosis not present

## 2019-08-07 DIAGNOSIS — D696 Thrombocytopenia, unspecified: Secondary | ICD-10-CM | POA: Diagnosis not present

## 2019-08-07 DIAGNOSIS — E1151 Type 2 diabetes mellitus with diabetic peripheral angiopathy without gangrene: Secondary | ICD-10-CM | POA: Diagnosis not present

## 2019-08-07 DIAGNOSIS — E785 Hyperlipidemia, unspecified: Secondary | ICD-10-CM | POA: Diagnosis not present

## 2019-08-07 DIAGNOSIS — R911 Solitary pulmonary nodule: Secondary | ICD-10-CM | POA: Diagnosis not present

## 2019-08-07 DIAGNOSIS — L03115 Cellulitis of right lower limb: Secondary | ICD-10-CM | POA: Diagnosis not present

## 2019-08-07 DIAGNOSIS — M1990 Unspecified osteoarthritis, unspecified site: Secondary | ICD-10-CM | POA: Diagnosis not present

## 2019-08-07 DIAGNOSIS — E1165 Type 2 diabetes mellitus with hyperglycemia: Secondary | ICD-10-CM | POA: Diagnosis not present

## 2019-08-07 DIAGNOSIS — E11319 Type 2 diabetes mellitus with unspecified diabetic retinopathy without macular edema: Secondary | ICD-10-CM | POA: Diagnosis not present

## 2019-08-07 DIAGNOSIS — I482 Chronic atrial fibrillation, unspecified: Secondary | ICD-10-CM | POA: Diagnosis not present

## 2019-08-07 DIAGNOSIS — K219 Gastro-esophageal reflux disease without esophagitis: Secondary | ICD-10-CM | POA: Diagnosis not present

## 2019-08-07 DIAGNOSIS — Z7901 Long term (current) use of anticoagulants: Secondary | ICD-10-CM | POA: Diagnosis not present

## 2019-08-07 DIAGNOSIS — E1140 Type 2 diabetes mellitus with diabetic neuropathy, unspecified: Secondary | ICD-10-CM | POA: Diagnosis not present

## 2019-08-07 DIAGNOSIS — J449 Chronic obstructive pulmonary disease, unspecified: Secondary | ICD-10-CM | POA: Diagnosis not present

## 2019-08-07 DIAGNOSIS — L03116 Cellulitis of left lower limb: Secondary | ICD-10-CM | POA: Diagnosis not present

## 2019-08-07 NOTE — Patient Outreach (Signed)
[facility]) Care Management  08/07/2019  Justin Ruiz [birth year] [id number]  Care coordination call placed to [employer] in regards to patient's application for Proventil HFA.  Spoke to [place] who informed the application was received and the attestation form was mailed to the patient on 07/31/2019.  Will outreach patient with this information.  [name] P. [name], [place]  [phone]

## 2019-08-07 NOTE — Progress Notes (Addendum)
HPI Patient was referred by Geraldo Pitter, NP, for inhaler optimization.  Past medical history includes COPD, allergic rhinits, OSA, GERD, HTN, mediastinal adenopathy, chronic diastolic HF, Afib (anticoagulated with apixaban 5 mg BID - appropriate dosing), peripheral vascular insufficiency, T2DM, DM retinopathy, non-Hogkin's lymphoma, HLD, and generalized anxiety disorder. Patient recently hospitalized from 07/28/19 to 07/30/19 for acute on chronic respiratory failure with hypoxia. Pulmonology (Dr. Melvyn Novas) was consulted during hospitalization; he recommended changing DPI to Pulmicort/Brovana and Yupelri. Patient was discharged on Symbicort with option of adding Yupelri. Patient had not started Symbicort during outpatient follow up visit with Geraldo Pitter, NP, 08/06/19. Geraldo Pitter, NP, consulted pharmacy regarding preference for patient to be on ICS/LABA LAMA nebulizer combination.   Benefits Investigation -Stiolto Respimat- $47.00 -Budesonide nebs- $27.42 -Spiriva Respimat- $47.00 -Plan prefers [facility] over [place]- $238.41.  Maretta Bees- $241.77  Patient is contacted via telephone today to [facility] to see pharmacy team. His wife [name]) is on the phone (permission given); she is his historian and caregiver. Of note, patient's niece will also assist in being patient's caregiver. [name] and patient are unsure exact reason why all prior inhalers were D/Ced or changed; [name] states she suspects it is because they lacked efficacy. Patient lives in a 1 person household and confirms he makes < $25,520.  Number of hospitalizations in past year: 3 (within 2021) Number of COPD exacerbations in past year: 3   Respiratory Medications Current: Trelegy, albuterol prn, duoneb nebs Tried in past: Anoro Ellipta, Spiriva Respimat, Theophylline, Dulera, Symbicort, Xopenex nebs Patient reports no adherence challenges  OBJECTIVE Allergies  Allergen Reactions  . Avapro  [Irbesartan] Other (See Comments)    "doesnt sit right with me"--light headed  . Lipitor [Atorvastatin Calcium] Other (See Comments)    Leg pain    Outpatient Encounter Medications as of 08/12/2019  Medication Sig Note  . acetaminophen (TYLENOL) 325 MG tablet Take 650 mg by mouth every 6 (six) hours as needed.   Marland Kitchen albuterol (VENTOLIN HFA) 108 (90 Base) MCG/ACT inhaler Inhale 2 puffs into the lungs every 4 (four) hours as needed for wheezing or shortness of breath.   . bisoprolol (ZEBETA) 5 MG tablet Take 1 tablet (5 mg total) by mouth 2 (two) times daily.   . Cholecalciferol (VITAMIN D3) 5000 UNITS TABS Take 1 tablet by mouth every morning.   Marland Kitchen ELIQUIS 5 MG TABS tablet Take 1 tablet by mouth twice daily   . furosemide (LASIX) 40 MG tablet Take 1-2 tablets (40-80 mg total) by mouth 2 (two) times daily. Patient takes 80mg  in the morning and 40mg  in the evening (Patient taking differently: 40 mg. Patient takes 80 mg in the morning and 40 mg in the evening)   . insulin regular (NOVOLIN R) 100 units/mL injection Inject 0.08 mLs (8 Units total) into the skin 3 (three) times daily before meals.   Marland Kitchen ipratropium-albuterol (DUONEB) 0.5-2.5 (3) MG/3ML SOLN Take 3 mLs by nebulization every 6 (six) hours as needed.   . nystatin (MYCOSTATIN/NYSTOP) powder Apply topically 2 (two) times daily. Apply to groin area and scrotum skin; keep area clean and dry.   Marland Kitchen oxymetazoline (AFRIN) 0.05 % nasal spray Place 1 spray into both nostrils 2 (two) times daily as needed for congestion. 07/28/2019: On hand if needed  . PARoxetine (PAXIL) 20 MG tablet Take 1 tablet by mouth once daily   . Semaglutide, 1 MG/DOSE, (OZEMPIC, 1 MG/DOSE,) 2 MG/1.5ML SOPN Inject 1 mg into the skin once a week.   . simvastatin (  ZOCOR) 40 MG tablet Take 1 tablet by mouth once daily    No facility-administered encounter medications on file as of 08/12/2019.     Immunization History  Administered Date(s) Administered  . Fluad Quad(high Dose 65+)  01/21/2019  . Influenza Split 01/09/2012  . Influenza Whole 01/23/2009, 01/24/2011  . Influenza, High Dose Seasonal PF 02/01/2016, 01/24/2017, 01/29/2018  . Influenza,inj,Quad PF,6+ Mos 01/30/2013, 01/24/2014, 01/21/2015  . Pneumococcal Conjugate-13 02/20/2014  . Pneumococcal Polysaccharide-23 02/24/2011  . Td 04/25/2004  . Tdap 09/24/2010     PFTs PFT Results Latest Ref Rng & Units 11/10/2017 06/17/2014  FVC-Pre L 1.18 1.75  FVC-Predicted Pre % 25 36  FVC-Post L 1.65 2.03  FVC-Predicted Post % 35 42  Pre FEV1/FVC % % 48 50  Post FEV1/FCV % % 48 50  FEV1-Pre L 0.56 0.87  FEV1-Predicted Pre % 16 24  FEV1-Post L 0.79 1.02  DLCO UNC% % 29 59  DLCO COR %Predicted % 49 93  TLC L 7.43 5.85  TLC % Predicted % 104 82  RV % Predicted % 252 158     Eosinophils Most recent blood eosinophil count was 100 cells/microL taken on 07/28/2019.   IgE - N/A   Assessment   1. Inhaler Optimization  PIFR - unable to assess considering this was a virtual appt  Optimal inhaler for patient would be Stiolto Respimat and budesonide nebs considering this is the most affordable option and easiest formulation to breathe for patient to use considering severity of COPD/HF. Patient will qualify for Stiolto Respimat patient assistance program via [place]. Mailed patient application with instructions on how to fill out application and provide proof of income documentation. Patient and [name] confirmed that budesonide nebs copay is affordable and they are comfortable using nebulizer machine.    2. Medication Reconciliation  A drug regimen assessment was performed, including review of allergies, interactions, disease-state management, dosing and immunization history. Medications were reviewed with the patient, including name, instructions, indication, goals of therapy, potential side effects, importance of adherence, and safe use. Eliquis is appropriately dosed.   Drug interaction(s): no concerning  interactions  3. Immunizations  Patient is indicated for the 2nd pneumovax23 and shingles vaccinations. Patient will follow up with provider on Thursday regarding pneumonia and shingles vaccination. Sent in Shingrix vaccine to patient's preferred local pharmacy.  PLAN 1. Complete PAP for Stiolto Respimat. Sent in rx for budesonide nebs. Once patient is approved will intiaite Stiolto Respimat + budesonide nebs. Will D/C Trelegy Ellipta at that time. 2. Advised patient to obtain pneumonia and shingles vaccine at provider appt scheduled for Thursday. If not possible to receive vaccines there, patient will obtain vaccinations at  local pharmacy.  All questions encouraged and answered.  Instructed patient to reach out with any further questions or concerns.  Thank you for allowing pharmacy to participate in this patient's care.  This appointment required 45 minutes of patient care (this includes precharting, chart review, review of results, virtual care, etc.).  Thank you for involving pharmacy to assist in providing this patient's care.   Drexel Iha, PharmD PGY2 Ambulatory Care Pharmacy Resident

## 2019-08-07 NOTE — Patient Outreach (Signed)
[facility]) Care Management  08/07/2019  Justin Ruiz [birth year] [id number]   ADDENDUM  Successful outreach call placed to patient's wife Justin Ruiz in regards to [employer] application for Proventil HFA.  Spoke to [place], [place] identifiers verified. Informed her to be expecting mail from [employer] patient assistance in regards to patient's application. Informed her that he would have to complete another form and mail that form and the original application back to [employer] with the [employer] provided. Informed her that I could help her with the form if she would call me when she receives it. Justin Ruiz was agreeable to this plan. Confirmed she had my contact information.  Will follow up with patient in 5-7 business days if call is not returned.  Marlee Trentman P. Amazing Cowman, [place]  [phone]

## 2019-08-08 ENCOUNTER — Ambulatory Visit (INDEPENDENT_AMBULATORY_CARE_PROVIDER_SITE_OTHER): Payer: Medicare Other | Admitting: Family Medicine

## 2019-08-08 ENCOUNTER — Ambulatory Visit (INDEPENDENT_AMBULATORY_CARE_PROVIDER_SITE_OTHER): Payer: Medicare Other | Admitting: Licensed Clinical Social Worker

## 2019-08-08 ENCOUNTER — Other Ambulatory Visit: Payer: Self-pay

## 2019-08-08 ENCOUNTER — Telehealth: Payer: Self-pay | Admitting: Pharmacist

## 2019-08-08 ENCOUNTER — Encounter: Payer: Self-pay | Admitting: Family Medicine

## 2019-08-08 VITALS — BP 122/77 | HR 72 | Temp 97.1°F | Resp 20 | Ht 69.0 in | Wt 279.4 lb

## 2019-08-08 DIAGNOSIS — E782 Mixed hyperlipidemia: Secondary | ICD-10-CM

## 2019-08-08 DIAGNOSIS — R0602 Shortness of breath: Secondary | ICD-10-CM

## 2019-08-08 DIAGNOSIS — Z794 Long term (current) use of insulin: Secondary | ICD-10-CM | POA: Diagnosis not present

## 2019-08-08 DIAGNOSIS — J9611 Chronic respiratory failure with hypoxia: Secondary | ICD-10-CM

## 2019-08-08 DIAGNOSIS — R630 Anorexia: Secondary | ICD-10-CM

## 2019-08-08 DIAGNOSIS — E1140 Type 2 diabetes mellitus with diabetic neuropathy, unspecified: Secondary | ICD-10-CM | POA: Diagnosis not present

## 2019-08-08 DIAGNOSIS — E119 Type 2 diabetes mellitus without complications: Secondary | ICD-10-CM | POA: Diagnosis not present

## 2019-08-08 DIAGNOSIS — J449 Chronic obstructive pulmonary disease, unspecified: Secondary | ICD-10-CM | POA: Diagnosis not present

## 2019-08-08 DIAGNOSIS — I5032 Chronic diastolic (congestive) heart failure: Secondary | ICD-10-CM | POA: Diagnosis not present

## 2019-08-08 DIAGNOSIS — I10 Essential (primary) hypertension: Secondary | ICD-10-CM

## 2019-08-08 DIAGNOSIS — F321 Major depressive disorder, single episode, moderate: Secondary | ICD-10-CM

## 2019-08-08 DIAGNOSIS — K219 Gastro-esophageal reflux disease without esophagitis: Secondary | ICD-10-CM

## 2019-08-08 DIAGNOSIS — F411 Generalized anxiety disorder: Secondary | ICD-10-CM

## 2019-08-08 MED ORDER — ESCITALOPRAM OXALATE 20 MG PO TABS: 20.0000 mg | ORAL_TABLET | Freq: Every day | ORAL | 2 refills | Status: DC

## 2019-08-08 MED ORDER — PREDNISONE 10 MG PO TABS: 10.0000 mg | ORAL_TABLET | Freq: Every day | ORAL | 0 refills | Status: DC

## 2019-08-08 MED ORDER — FUROSEMIDE 40 MG PO TABS: 40.0000 mg | ORAL_TABLET | Freq: Two times a day (BID) | ORAL | 1 refills | Status: DC

## 2019-08-08 NOTE — Progress Notes (Signed)
Subjective:  Patient ID: Justin Ruiz, male    DOB: 10-01-1950  Age: 69 y.o. MRN: [id number]  CC: No chief complaint on file.   HPI Justin Ruiz presents for Transition of care visit due to recent hospitalization April 4-6 2021. He continues to struggle with decreased appetite, low glucose and shortness of breath. He gets upset easily states his wife, who gives much of today's history. He has DCed the nerve pills. Depressed but paxil isn't helping. Needs guidance regarding how much lasix to take. Swelling has diminished significantly.    Depression screen [facility] 2/9 07/04/2019 03/07/2019 03/04/2019  Decreased Interest 0 1 1  Down, Depressed, Hopeless 0 1 1  PHQ - 2 Score 0 2 2  Altered sleeping - 1 1  Tired, decreased energy - 0 1  Change in appetite - 0 0  Feeling bad or failure about yourself  - 1 0  Trouble concentrating - 1 1  Moving slowly or fidgety/restless - 0 1  Suicidal thoughts - 3 0  PHQ-9 Score - - 6  Difficult doing work/chores - Somewhat difficult Somewhat difficult  Some recent data might be hidden    History Justin Ruiz has a past medical history of Acute diastolic heart failure ([place]) (03/28/2016), Acute encephalopathy (03/28/2016), Acute encephalopathy (03/28/2016), Acute on chronic respiratory failure with hypoxia (HCC) (12/04/2016), Arthritis, Asthma, Atrial fibrillation ([place]), Benign neoplasm of ascending colon (05/05/2014), BPH (benign prostatic hypertrophy), CARDIOVASCULAR STUDIES, ABNORMAL (04/27/2009), Chronic anticoagulation (08/20/2012), Chronic respiratory failure with hypoxia ([place]) (11/10/2017), Colon polyps, COPD mixed type ([place]) (03/28/2016), Diabetes mellitus, Diabetic neuropathy, type II diabetes mellitus ([place]) (09/25/2014), Diabetic retinopathy ([place]) (05/28/2017), 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   He has a past surgical history that includes Appendectomy and Colonoscopy with propofol (N/A, 05/05/2014).   His family history includes Aortic aneurysm in his sister; COPD in his sister and sister; Colon cancer in his father; Colon polyps in his father; Diabetes in his maternal grandmother, mother, and sister; Heart attack in his sister; Heart disease in his mother, sister, and sister; Liver cancer in his mother; Prostate cancer in his father; Pulmonary embolism in his sister.He reports that he quit smoking about 9 months ago. His smoking use included cigarettes. He started smoking about 50 years ago. He has a 40.00 pack-year smoking history. He has never used smokeless tobacco. He reports that he does not drink alcohol or use drugs.    ROS Review of Systems  Constitutional: Negative.   HENT: Negative.   Eyes: Negative for visual disturbance.  Respiratory: Positive for shortness of breath. Negative for cough.   Cardiovascular: Negative for chest pain and leg swelling.  Gastrointestinal: Negative for abdominal pain, diarrhea, nausea and vomiting.  Genitourinary: Negative for difficulty urinating.  Musculoskeletal: Positive for arthralgias. Negative for myalgias.  Skin: Negative for rash.  Neurological: Negative for headaches.  Psychiatric/Behavioral: Negative for sleep disturbance.    Objective:  BP 122/77   Pulse 72   Temp (!) 97.1 F (36.2 C) (Oral)   Resp 20   Ht _0  (1.753 m)   Wt 279 lb 6 oz (126.7 kg)   SpO2  97%   BMI 41.26 kg/m   BP Readings from Last 3 Encounters:    08/08/19 122/77  08/06/19 120/60  07/31/19 130/76    Wt Readings from Last 3 Encounters:  08/08/19 279 lb 6 oz (126.7 kg)  08/06/19 278 lb (126.1 kg)  07/31/19 286 lb (129.7 kg)     Physical Exam Constitutional:      General: He is not in acute distress.    Appearance: He is well-developed. He is ill-appearing.  HENT:     Head: Normocephalic and atraumatic.     Right Ear: External ear normal.     Left Ear: External ear normal.     Nose: Nose normal.  Eyes:     Conjunctiva/sclera: Conjunctivae normal.     Pupils: Pupils are equal, round, and reactive to light.  Cardiovascular:     Rate and Rhythm: Normal rate and regular rhythm.     Heart sounds: Normal heart sounds. No murmur.  Pulmonary:     Effort: Pulmonary effort is normal. No respiratory distress.     Breath sounds: No stridor. Wheezing, rhonchi and rales present.  Abdominal:     Palpations: Abdomen is soft.     Tenderness: There is no abdominal tenderness.  Musculoskeletal:        General: Normal range of motion.     Cervical back: Normal range of motion and neck supple.  Skin:    General: Skin is warm and dry.  Neurological:     Mental Status: He is alert and oriented to person, place, and time.     Deep Tendon Reflexes: Reflexes are normal and symmetric.  Psychiatric:        Behavior: Behavior normal.        Thought Content: Thought content normal.        Judgment: Judgment normal.       Assessment & Plan:   Diagnoses and all orders for this visit:  Chronic respiratory failure with hypoxia (HCC) -     predniSONE (DELTASONE) 10 MG tablet; Take 1 tablet (10 mg total) by mouth daily with breakfast. -     CBC with Differential/Platelet -     CMP14+EGFR -     Brain natriuretic peptide  Shortness of breath -     predniSONE (DELTASONE) 10 MG tablet; Take 1 tablet (10 mg total) by mouth daily with breakfast. -     CBC with Differential/Platelet -     CMP14+EGFR -     Brain natriuretic peptide  Loss  of appetite -     predniSONE (DELTASONE) 10 MG tablet; Take 1 tablet (10 mg total) by mouth daily with breakfast. -     CBC with Differential/Platelet -     CMP14+EGFR -     Brain natriuretic peptide  Type 2 diabetes mellitus with diabetic neuropathy, with long-term current use of insulin (HCC) -     CBC with Differential/Platelet -     CMP14+EGFR -     Brain natriuretic peptide  Chronic diastolic heart failure (HCC) -     furosemide (LASIX) 40 MG tablet; Take 1 tablet (40 mg total) by mouth 2 (two) times daily. -     CBC with Differential/Platelet -     CMP14+EGFR -     Brain natriuretic peptide  Depression, major, single episode, moderate (HCC) -     escitalopram (LEXAPRO) 20 MG tablet; Take 1 tablet (20 mg total) by mouth daily.       I have discontinued [name]  B. Filyaw's PARoxetine. I have also changed his furosemide. Additionally, I am having him start on predniSONE and escitalopram. Lastly, I am having him maintain his Vitamin D3, ipratropium-albuterol, Eliquis, simvastatin, Ozempic (1 MG/DOSE), acetaminophen, nystatin, oxymetazoline, insulin regular, bisoprolol, albuterol, and Trelegy Ellipta.  Allergies as of 08/08/2019      Reactions   Avapro [irbesartan] Other (See Comments)   "doesnt sit right with me"--light headed   Lipitor [atorvastatin Calcium] Other (See Comments)   Leg pain      Medication List       Accurate as of August 08, 2019 11:59 PM. If you have any questions, ask your nurse or doctor.        STOP taking these medications   PARoxetine 20 MG tablet Commonly known as: PAXIL Stopped by: [name], MD     TAKE these medications   acetaminophen 325 MG tablet Commonly known as: TYLENOL Take 650 mg by mouth every 6 (six) hours as needed.   albuterol 108 (90 Base) MCG/ACT inhaler Commonly known as: VENTOLIN HFA Inhale 2 puffs into the lungs every 4 (four) hours as needed for wheezing or shortness of breath.   bisoprolol 5 MG tablet Commonly  known as: ZEBETA Take 1 tablet (5 mg total) by mouth 2 (two) times daily.   Eliquis 5 MG Tabs tablet Generic drug: apixaban Take 1 tablet by mouth twice daily   escitalopram 20 MG tablet Commonly known as: LEXAPRO Take 1 tablet (20 mg total) by mouth daily. Started by: [name], MD   furosemide 40 MG tablet Commonly known as: LASIX Take 1 tablet (40 mg total) by mouth 2 (two) times daily. What changed:   how much to take  additional instructions Changed by: [name], MD   insulin regular 100 units/mL injection Commonly known as: NOVOLIN R Inject 0.08 mLs (8 Units total) into the skin 3 (three) times daily before meals.   ipratropium-albuterol 0.5-2.5 (3) MG/3ML Soln Commonly known as: DUONEB Take 3 mLs by nebulization every 6 (six) hours as needed.   nystatin powder Commonly known as: MYCOSTATIN/NYSTOP Apply topically 2 (two) times daily. Apply to groin area and scrotum skin; keep area clean and dry.   oxymetazoline 0.05 % nasal spray Commonly known as: AFRIN Place 1 spray into both nostrils 2 (two) times daily as needed for congestion.   Ozempic (1 MG/DOSE) 2 MG/1.5ML Sopn Generic drug: Semaglutide (1 MG/DOSE) Inject 1 mg into the skin once a week.   predniSONE 10 MG tablet Commonly known as: DELTASONE Take 1 tablet (10 mg total) by mouth daily with breakfast. Started by: [name], MD   simvastatin 40 MG tablet Commonly known as: ZOCOR Take 1 tablet by mouth once daily   Trelegy Ellipta 100-62.5-25 MCG/INH Aepb Generic drug: Fluticasone-Umeclidin-Vilant Inhale into the lungs.   Vitamin D3 125 MCG (5000 UT) Tabs Take 1 tablet by mouth every morning.        Follow-up: Return in about 1 week (around 08/15/2019) for diabetes.  [name], M.D.

## 2019-08-08 NOTE — Chronic Care Management (AMB) (Signed)
Chronic Care Management    Clinical Social Work Follow Up Note  08/08/2019 Name: Justin Ruiz MRN: [id number] DOB: 05-Mar-1951  Justin Ruiz is a 69 y.o. year old male who is a primary care patient of Stacks, [name], MD. The CCM team was consulted for assistance with [employer] .   Review of patient status, including review of consultants reports, other relevant assessments, and collaboration with appropriate care team members and the patient's provider was performed as part of comprehensive patient evaluation and provision of chronic care management services.    SDOH (Social Determinants of Health) assessments performed: Yes. Risk for social isolation; risk for tobacco use; risk for stress; risk for physical inactivity     Chronic Care Management from 03/04/2019 in [facility]  PHQ-9 Total Score  6     GAD 7 : Generalized Anxiety Score 03/04/2019  Nervous, Anxious, on Edge 1  Control/stop worrying 0  Worry too much - different things 0  Trouble relaxing 1  Restless 0  Easily annoyed or irritable 0  Afraid - awful might happen 1  Total GAD 7 Score 3  Anxiety Difficulty Somewhat difficult    Outpatient Encounter Medications as of 08/08/2019  Medication Sig Note  . acetaminophen (TYLENOL) 325 MG tablet Take 650 mg by mouth every 6 (six) hours as needed.   Marland Kitchen albuterol (VENTOLIN HFA) 108 (90 Base) MCG/ACT inhaler Inhale 2 puffs into the lungs every 4 (four) hours as needed for wheezing or shortness of breath.   . bisoprolol (ZEBETA) 5 MG tablet Take 1 tablet (5 mg total) by mouth 2 (two) times daily.   . Cholecalciferol (VITAMIN D3) 5000 UNITS TABS Take 1 tablet by mouth every morning.   Marland Kitchen ELIQUIS 5 MG TABS tablet Take 1 tablet by mouth twice daily   . furosemide (LASIX) 40 MG tablet Take 1-2 tablets (40-80 mg total) by mouth 2 (two) times daily. Patient takes 80mg  in the morning and 40mg  in the evening (Patient taking differently: 40 mg. Patient takes  80 mg in the morning and 40 mg in the evening)   . insulin regular (NOVOLIN R) 100 units/mL injection Inject 0.08 mLs (8 Units total) into the skin 3 (three) times daily before meals.   Marland Kitchen ipratropium-albuterol (DUONEB) 0.5-2.5 (3) MG/3ML SOLN Take 3 mLs by nebulization every 6 (six) hours as needed.   . nystatin (MYCOSTATIN/NYSTOP) powder Apply topically 2 (two) times daily. Apply to groin area and scrotum skin; keep area clean and dry.   Marland Kitchen oxymetazoline (AFRIN) 0.05 % nasal spray Place 1 spray into both nostrils 2 (two) times daily as needed for congestion. 07/28/2019: On hand if needed  . PARoxetine (PAXIL) 20 MG tablet Take 1 tablet by mouth once daily   . Semaglutide, 1 MG/DOSE, (OZEMPIC, 1 MG/DOSE,) 2 MG/1.5ML SOPN Inject 1 mg into the skin once a week.   . simvastatin (ZOCOR) 40 MG tablet Take 1 tablet by mouth once daily    No facility-administered encounter medications on file as of 08/08/2019.    Goals    . Client will talk with LCSW in next 30 days to discuss anxiety, stress issues of client (pt-stated)     Current Barriers:  . Anxiety issues in client with Chronic Diagnoses of GAD, COPD,DM Type 2, HLD, HTN, and GERD . Breathing issues . Irregular meal schedule  Clinical Social Work Clinical Goal(s):  . Client will talk with LCSW in next 30 days to discus anxiety and stress issues of  client  Interventions:   Encouraged [name] and spouse to call RNCM to discuss nursing issues of client  Talked with Caine Siharath about client panic episodes/anxiety symptoms  Talked with [name] about mobility issues of client (client uses a walker to help him ambulate)  Talked with [name] about oxygen use of client  Talked with [name] about client weakness, tiredness upon exersion . Talked with [name] about client's upcoming appointments . Talked with [name] about sleeping challenges of client . Talked with [name] about relaxation techniques of client (client watches TV, talks via phone  with family members)  Patient Self Care Activities:  . Attends scheduled medical appointments . Takes medications as prescribed  Plan:   Client to attend scheduled medical appointments LCSW to call client or spouse of client  in next 4 weeks to talk with client about his anxiety and stress symptoms management.  Client or spouse to talk with RNCM as needed to discuss nursing needs  Initial goal documentation      Follow Up Plan: LCSW to call client or spouse of client in next 4 weeks to talk with client or spouse about client management of anxiety and stress symptoms faced  Norva Riffle.Jalayia Bagheri MSW, LCSW Licensed Clinical Social Worker [facility] Care Management [phone]

## 2019-08-08 NOTE — Patient Instructions (Addendum)
Licensed Clinical Social Worker Visit Information  Goals we discussed today:  Goals    . Client will talk with LCSW in next 30 days to discuss anxiety, stress issues of client (pt-stated)     Current Barriers:  . Anxiety issues in client with Chronic Diagnoses of GAD, COPD,DM Type 2, HLD, HTN, and GERD . Breathing issues . Irregular meal schedule  Clinical Social Work Clinical Goal(s):  . Client will talk with LCSW in next 30 days to discus anxiety and stress issues of client  Interventions: Encouraged [name] and spouse to call RNCM to discuss nursing issues of client  Talked with [name] about client panic episodes/anxiety symptoms  Talked with [name] about mobility issues of client (client uses a walker to help him ambulate)  Talked with [name] about oxygen use of client Talked with [name] about client weakness, tiredness upon exersion Talked with [name] about client's upcoming appointments  Talked with [name] about sleeping challenges of client  Talked with [name] about relaxation techniques of client (client watches TV, talks via phone with family members)  Patient Self Care Activities:  . Attends scheduled medical appointments . Takes medications as prescribed  Plan:    Client to attend scheduled medical appointments LCSW to call client or spouse of client  in next 4 weeks to talk with client about his anxiety and stress symptoms management.  Client or spouse to talk with RNCM as needed to discuss nursing needs  Initial goal documentation         Materials Provided:  No  Follow Up Plan: LCSW to call client or spouse of client in next 4 weeks to talk with  client or spouse of client about client management or anxiety or stress symptoms faced  The patient/Pauline Albanese, spouse, verbalized understanding of instructions provided today and declined a print copy of patient instruction materials.   Norva Riffle.Alyanna Stoermer MSW, LCSW Licensed Clinical Social  Worker [facility] [phone]

## 2019-08-08 NOTE — Patient Outreach (Signed)
[name])  [name]    Spoke with patient's wife Justin Ruiz) about his inhaler therapy. HIPAA identifiers were obtained. When asked about completing a medication review and to reassess the patient for medication assistance, she said she did not want to complete a medication review with me today because she is scheduled to speak with a Pharmacist at her Pulmonologist's office Monday morning.  [name] said she would call me after that appointment if she needed additional information.  Plan: Close patient's pharmacy case as our team will no longer document in Epic. Will gladly continue to follow the patient for med assistance in future if necessary.  [name], PharmD, [place] Clinical Pharmacist [phone]

## 2019-08-08 NOTE — Patient Instructions (Signed)
Carbohydrate Counting for Diabetes Mellitus, Adult  Carbohydrate counting is a method of keeping track of how many carbohydrates you eat. Eating carbohydrates naturally increases the amount of sugar (glucose) in the blood. Counting how many carbohydrates you eat helps keep your blood glucose within normal limits, which helps you manage your diabetes (diabetes mellitus). It is important to know how many carbohydrates you can safely have in each meal. This is different for every person. A diet and nutrition specialist (registered dietitian) can help you make a meal plan and calculate how many carbohydrates you should have at each meal and snack. Carbohydrates are found in the following foods:  Grains, such as breads and cereals.  Dried beans and soy products.  Starchy vegetables, such as potatoes, peas, and corn.  Fruit and fruit juices.  Milk and yogurt.  Sweets and snack foods, such as cake, cookies, candy, chips, and soft drinks. How do I count carbohydrates? There are two ways to count carbohydrates in food. You can use either of the methods or a combination of both. Reading "Nutrition Facts" on packaged food The "Nutrition Facts" list is included on the labels of almost all packaged foods and beverages in the [place] It includes:  The serving size.  Information about nutrients in each serving, including the grams (g) of carbohydrate per serving. To use the "Nutrition Facts":  Decide how many servings you will have.  Multiply the number of servings by the number of carbohydrates per serving.  The resulting number is the total amount of carbohydrates that you will be having. Learning standard serving sizes of other foods When you eat carbohydrate foods that are not packaged or do not include "Nutrition Facts" on the label, you need to measure the servings in order to count the amount of carbohydrates:  Measure the foods that you will eat with a food scale or measuring cup, if  needed.  Decide how many standard-size servings you will eat.  Multiply the number of servings by 15. Most carbohydrate-rich foods have about 15 g of carbohydrates per serving. ? For example, if you eat 8 oz (170 g) of strawberries, you will have eaten 2 servings and 30 g of carbohydrates (2 servings x 15 g = 30 g).  For foods that have more than one food mixed, such as soups and casseroles, you must count the carbohydrates in each food that is included. The following list contains standard serving sizes of common carbohydrate-rich foods. Each of these servings has about 15 g of carbohydrates:   hamburger bun or  English muffin.   oz (15 mL) syrup.   oz (14 g) jelly.  1 slice of bread.  1 six-inch tortilla.  3 oz (85 g) cooked rice or pasta.  4 oz (113 g) cooked dried beans.  4 oz (113 g) starchy vegetable, such as peas, corn, or potatoes.  4 oz (113 g) hot cereal.  4 oz (113 g) mashed potatoes or  of a large baked potato.  4 oz (113 g) canned or frozen fruit.  4 oz (120 mL) fruit juice.  4-6 crackers.  6 chicken nuggets.  6 oz (170 g) unsweetened dry cereal.  6 oz (170 g) plain fat-free yogurt or yogurt sweetened with artificial sweeteners.  8 oz (240 mL) milk.  8 oz (170 g) fresh fruit or one small piece of fruit.  24 oz (680 g) popped popcorn. Example of carbohydrate counting Sample meal  3 oz (85 g) chicken breast.  6 oz (170 g)   brown rice.  4 oz (113 g) corn.  8 oz (240 mL) milk.  8 oz (170 g) strawberries with sugar-free whipped topping. Carbohydrate calculation 1. Identify the foods that contain carbohydrates: ? Rice. ? Corn. ? Milk. ? Strawberries. 2. Calculate how many servings you have of each food: ? 2 servings rice. ? 1 serving corn. ? 1 serving milk. ? 1 serving strawberries. 3. Multiply each number of servings by 15 g: ? 2 servings rice x 15 g = 30 g. ? 1 serving corn x 15 g = 15 g. ? 1 serving milk x 15 g = 15 g. ? 1  serving strawberries x 15 g = 15 g. 4. Add together all of the amounts to find the total grams of carbohydrates eaten: ? 30 g + 15 g + 15 g + 15 g = 75 g of carbohydrates total. Summary  Carbohydrate counting is a method of keeping track of how many carbohydrates you eat.  Eating carbohydrates naturally increases the amount of sugar (glucose) in the blood.  Counting how many carbohydrates you eat helps keep your blood glucose within normal limits, which helps you manage your diabetes.  A diet and nutrition specialist (registered dietitian) can help you make a meal plan and calculate how many carbohydrates you should have at each meal and snack. This information is not intended to replace advice given to you by your health care provider. Make sure you discuss any questions you have with your health care provider. Document Revised: 11/03/2016 Document Reviewed: 09/23/2015 Elsevier Patient Education  2020 Elsevier Inc.  

## 2019-08-09 ENCOUNTER — Telehealth: Payer: Self-pay | Admitting: *Deleted

## 2019-08-09 ENCOUNTER — Ambulatory Visit: Payer: Self-pay | Admitting: Pharmacist

## 2019-08-09 DIAGNOSIS — R911 Solitary pulmonary nodule: Secondary | ICD-10-CM | POA: Diagnosis not present

## 2019-08-09 DIAGNOSIS — E1165 Type 2 diabetes mellitus with hyperglycemia: Secondary | ICD-10-CM | POA: Diagnosis not present

## 2019-08-09 DIAGNOSIS — I482 Chronic atrial fibrillation, unspecified: Secondary | ICD-10-CM | POA: Diagnosis not present

## 2019-08-09 DIAGNOSIS — E1140 Type 2 diabetes mellitus with diabetic neuropathy, unspecified: Secondary | ICD-10-CM | POA: Diagnosis not present

## 2019-08-09 DIAGNOSIS — L03116 Cellulitis of left lower limb: Secondary | ICD-10-CM | POA: Diagnosis not present

## 2019-08-09 DIAGNOSIS — D472 Monoclonal gammopathy: Secondary | ICD-10-CM | POA: Diagnosis not present

## 2019-08-09 DIAGNOSIS — Z794 Long term (current) use of insulin: Secondary | ICD-10-CM | POA: Diagnosis not present

## 2019-08-09 DIAGNOSIS — M1990 Unspecified osteoarthritis, unspecified site: Secondary | ICD-10-CM | POA: Diagnosis not present

## 2019-08-09 DIAGNOSIS — I872 Venous insufficiency (chronic) (peripheral): Secondary | ICD-10-CM | POA: Diagnosis not present

## 2019-08-09 DIAGNOSIS — J449 Chronic obstructive pulmonary disease, unspecified: Secondary | ICD-10-CM | POA: Diagnosis not present

## 2019-08-09 DIAGNOSIS — J9621 Acute and chronic respiratory failure with hypoxia: Secondary | ICD-10-CM | POA: Diagnosis not present

## 2019-08-09 DIAGNOSIS — E785 Hyperlipidemia, unspecified: Secondary | ICD-10-CM | POA: Diagnosis not present

## 2019-08-09 DIAGNOSIS — I5033 Acute on chronic diastolic (congestive) heart failure: Secondary | ICD-10-CM | POA: Diagnosis not present

## 2019-08-09 DIAGNOSIS — Z7901 Long term (current) use of anticoagulants: Secondary | ICD-10-CM | POA: Diagnosis not present

## 2019-08-09 DIAGNOSIS — I110 Hypertensive heart disease with heart failure: Secondary | ICD-10-CM | POA: Diagnosis not present

## 2019-08-09 DIAGNOSIS — Z7951 Long term (current) use of inhaled steroids: Secondary | ICD-10-CM | POA: Diagnosis not present

## 2019-08-09 DIAGNOSIS — D696 Thrombocytopenia, unspecified: Secondary | ICD-10-CM | POA: Diagnosis not present

## 2019-08-09 DIAGNOSIS — G4733 Obstructive sleep apnea (adult) (pediatric): Secondary | ICD-10-CM | POA: Diagnosis not present

## 2019-08-09 DIAGNOSIS — E1151 Type 2 diabetes mellitus with diabetic peripheral angiopathy without gangrene: Secondary | ICD-10-CM | POA: Diagnosis not present

## 2019-08-09 DIAGNOSIS — L03115 Cellulitis of right lower limb: Secondary | ICD-10-CM | POA: Diagnosis not present

## 2019-08-09 DIAGNOSIS — E11319 Type 2 diabetes mellitus with unspecified diabetic retinopathy without macular edema: Secondary | ICD-10-CM | POA: Diagnosis not present

## 2019-08-09 DIAGNOSIS — K219 Gastro-esophageal reflux disease without esophagitis: Secondary | ICD-10-CM | POA: Diagnosis not present

## 2019-08-09 LAB — CMP14+EGFR
ALT: 16 IU/L (ref 0–44)
AST: 15 IU/L (ref 0–40)
Albumin/Globulin Ratio: 1.1 — ABNORMAL LOW (ref 1.2–2.2)
Albumin: 4 g/dL (ref 3.8–4.8)
Alkaline Phosphatase: 117 IU/L (ref 39–117)
BUN/Creatinine Ratio: 20 (ref 10–24)
BUN: 32 mg/dL — ABNORMAL HIGH (ref 8–27)
Bilirubin Total: 0.9 mg/dL (ref 0.0–1.2)
CO2: 32 mmol/L — ABNORMAL HIGH (ref 20–29)
Calcium: 9.7 mg/dL (ref 8.6–10.2)
Chloride: 88 mmol/L — ABNORMAL LOW (ref 96–106)
Creatinine, Ser: 1.64 mg/dL — ABNORMAL HIGH (ref 0.76–1.27)
GFR calc Af Amer: 49 mL/min/{1.73_m2} — ABNORMAL LOW (ref >59)
GFR calc non Af Amer: 42 mL/min/{1.73_m2} — ABNORMAL LOW (ref >59)
Globulin, Total: 3.6 g/dL (ref 1.5–4.5)
Glucose: 323 mg/dL — ABNORMAL HIGH (ref 65–99)
Potassium: 3.6 mmol/L (ref 3.5–5.2)
Sodium: 135 mmol/L (ref 134–144)
Total Protein: 7.6 g/dL (ref 6.0–8.5)

## 2019-08-09 LAB — CBC WITH DIFFERENTIAL/PLATELET
Basophils Absolute: 0.1 10*3/uL (ref 0.0–0.2)
Basos: 1 %
EOS (ABSOLUTE): 0.1 10*3/uL (ref 0.0–0.4)
Eos: 1 %
Hematocrit: 44.1 % (ref 37.5–51.0)
Hemoglobin: 14.8 g/dL (ref 13.0–17.7)
Immature Grans (Abs): 0 10*3/uL (ref 0.0–0.1)
Immature Granulocytes: 0 %
Lymphocytes Absolute: 1.4 10*3/uL (ref 0.7–3.1)
Lymphs: 20 %
MCH: 29.5 pg (ref 26.6–33.0)
MCHC: 33.6 g/dL (ref 31.5–35.7)
MCV: 88 fL (ref 79–97)
Monocytes Absolute: 0.5 10*3/uL (ref 0.1–0.9)
Monocytes: 7 %
Neutrophils Absolute: 5.1 10*3/uL (ref 1.4–7.0)
Neutrophils: 71 %
Platelets: 206 10*3/uL (ref 150–450)
RBC: 5.02 x10E6/uL (ref 4.14–5.80)
RDW: 13.3 % (ref 11.6–15.4)
WBC: 7.1 10*3/uL (ref 3.4–10.8)

## 2019-08-09 LAB — BRAIN NATRIURETIC PEPTIDE: BNP: 120.5 pg/mL — ABNORMAL HIGH (ref 0.0–100.0)

## 2019-08-09 NOTE — Telephone Encounter (Signed)
Give regular insulin based on sliding scale and reject in 15 -20 mintues to make sure coming down- if does not start coming down - needs to go to ER

## 2019-08-09 NOTE — Telephone Encounter (Signed)
Call from [place] w/ [facility] Pt is on prednisone This mornings fasting BS was 418 at 02/1129 it was 486, BS at time of call at 231 pm was 562  Took insulin earlier today 8 u Novoliln

## 2019-08-09 NOTE — Telephone Encounter (Signed)
Levada Dy aware to give 8U of insulin and to recheck BS in 15 - 20 mins

## 2019-08-10 DIAGNOSIS — J449 Chronic obstructive pulmonary disease, unspecified: Secondary | ICD-10-CM | POA: Diagnosis not present

## 2019-08-12 ENCOUNTER — Other Ambulatory Visit: Payer: Self-pay

## 2019-08-12 ENCOUNTER — Encounter: Payer: Self-pay | Admitting: Family Medicine

## 2019-08-12 ENCOUNTER — Ambulatory Visit (INDEPENDENT_AMBULATORY_CARE_PROVIDER_SITE_OTHER): Payer: Medicare Other | Admitting: Pharmacist

## 2019-08-12 DIAGNOSIS — R9431 Abnormal electrocardiogram [ECG] [EKG]: Secondary | ICD-10-CM

## 2019-08-12 DIAGNOSIS — J449 Chronic obstructive pulmonary disease, unspecified: Secondary | ICD-10-CM | POA: Diagnosis not present

## 2019-08-12 MED ORDER — ZOSTER VAC RECOMB ADJUVANTED 50 MCG/0.5ML IM SUSR: 0.5000 mL | Freq: Once | INTRAMUSCULAR | 1 refills | Status: AC

## 2019-08-12 MED ORDER — BUDESONIDE 0.25 MG/2ML IN SUSP: 0.2500 mg | Freq: Two times a day (BID) | RESPIRATORY_TRACT | 12 refills | Status: DC

## 2019-08-13 ENCOUNTER — Telehealth: Payer: Self-pay | Admitting: *Deleted

## 2019-08-13 DIAGNOSIS — I482 Chronic atrial fibrillation, unspecified: Secondary | ICD-10-CM | POA: Diagnosis not present

## 2019-08-13 DIAGNOSIS — J449 Chronic obstructive pulmonary disease, unspecified: Secondary | ICD-10-CM | POA: Diagnosis not present

## 2019-08-13 DIAGNOSIS — Z7951 Long term (current) use of inhaled steroids: Secondary | ICD-10-CM | POA: Diagnosis not present

## 2019-08-13 DIAGNOSIS — M1990 Unspecified osteoarthritis, unspecified site: Secondary | ICD-10-CM | POA: Diagnosis not present

## 2019-08-13 DIAGNOSIS — G4733 Obstructive sleep apnea (adult) (pediatric): Secondary | ICD-10-CM | POA: Diagnosis not present

## 2019-08-13 DIAGNOSIS — E11319 Type 2 diabetes mellitus with unspecified diabetic retinopathy without macular edema: Secondary | ICD-10-CM | POA: Diagnosis not present

## 2019-08-13 DIAGNOSIS — D472 Monoclonal gammopathy: Secondary | ICD-10-CM | POA: Diagnosis not present

## 2019-08-13 DIAGNOSIS — K219 Gastro-esophageal reflux disease without esophagitis: Secondary | ICD-10-CM | POA: Diagnosis not present

## 2019-08-13 DIAGNOSIS — E1165 Type 2 diabetes mellitus with hyperglycemia: Secondary | ICD-10-CM | POA: Diagnosis not present

## 2019-08-13 DIAGNOSIS — E1140 Type 2 diabetes mellitus with diabetic neuropathy, unspecified: Secondary | ICD-10-CM | POA: Diagnosis not present

## 2019-08-13 DIAGNOSIS — L03116 Cellulitis of left lower limb: Secondary | ICD-10-CM | POA: Diagnosis not present

## 2019-08-13 DIAGNOSIS — Z794 Long term (current) use of insulin: Secondary | ICD-10-CM | POA: Diagnosis not present

## 2019-08-13 DIAGNOSIS — E785 Hyperlipidemia, unspecified: Secondary | ICD-10-CM | POA: Diagnosis not present

## 2019-08-13 DIAGNOSIS — I5033 Acute on chronic diastolic (congestive) heart failure: Secondary | ICD-10-CM | POA: Diagnosis not present

## 2019-08-13 DIAGNOSIS — I110 Hypertensive heart disease with heart failure: Secondary | ICD-10-CM | POA: Diagnosis not present

## 2019-08-13 DIAGNOSIS — R911 Solitary pulmonary nodule: Secondary | ICD-10-CM | POA: Diagnosis not present

## 2019-08-13 DIAGNOSIS — E1151 Type 2 diabetes mellitus with diabetic peripheral angiopathy without gangrene: Secondary | ICD-10-CM | POA: Diagnosis not present

## 2019-08-13 DIAGNOSIS — L03115 Cellulitis of right lower limb: Secondary | ICD-10-CM | POA: Diagnosis not present

## 2019-08-13 DIAGNOSIS — D696 Thrombocytopenia, unspecified: Secondary | ICD-10-CM | POA: Diagnosis not present

## 2019-08-13 DIAGNOSIS — Z7901 Long term (current) use of anticoagulants: Secondary | ICD-10-CM | POA: Diagnosis not present

## 2019-08-13 DIAGNOSIS — I872 Venous insufficiency (chronic) (peripheral): Secondary | ICD-10-CM | POA: Diagnosis not present

## 2019-08-13 DIAGNOSIS — J9621 Acute and chronic respiratory failure with hypoxia: Secondary | ICD-10-CM | POA: Diagnosis not present

## 2019-08-13 NOTE — Telephone Encounter (Signed)
[place] nurse w/ [facility] At pt's home, BS at 8 this morning was 467, took 8u of Novolin only had wheat toast & eggs. Recheck BS now was 480 Per VO from Dr. Livia Snellen, pt is to take 12u of Novolin and recheck Bs in an hour Stephanie verbalizes understanding and re-instructed this to patient

## 2019-08-13 NOTE — Progress Notes (Deleted)
Cardiology Office Note   Date:  08/13/2019   ID:  Justin Ruiz, DOB 11-Nov-1950, MRN [id number]  PCP:  [name], MD  Cardiologist:   Minus Breeding, MD   No chief complaint on file.     History of Present Illness: Justin Ruiz is a 69 y.o. male who presents for followup atrial fib.  With a history of COPD and  acute on chronic heart failure with an EF on echo of 50%.   He was in the  hospital two days ago.  He was in the hospital in March as well a couple of times   Again, I reviewed these records for this appt.  He had a moderate pericardial effusion in March.  His episodes of respiratory failure are thought to be multifactorial with acute diastolic HF and COPD.   He also has had severe hypoglycemia on presentation.   On 4/5 he had a repeat echo.   There was no evidence of pericardial effusion.  ***     He said that he felt okay after he got home from the hospital but the last night he had a difficult night.  He felt like he was acutely short of breath.  He orders.  He sleeps chronically in a chair.  He has 3 L of oxygen all the time he turns it up before and after he is moving around.  He did not have pain.  He has not had swelling.  He did not have any cough fevers or chills.  He has not had any edema.  He is trying to avoid salt.  He has not had any new chest pressure, neck or arm discomfort.  He says that his oxygen level is actually pretty decent in the 90s when he was short of breath.   Past Medical History:  Diagnosis Date  . Acute diastolic heart failure ([place]) 03/28/2016  . Acute encephalopathy 03/28/2016  . Acute encephalopathy 03/28/2016  . Acute on chronic respiratory failure with hypoxia ([place]) 12/04/2016  . Arthritis   . Asthma   . Atrial fibrillation ([place])    on AC  . Benign neoplasm of ascending colon 05/05/2014  . BPH (benign prostatic hypertrophy)   . CARDIOVASCULAR STUDIES, ABNORMAL 04/27/2009   Qualifier: Diagnosis of  By: Percival Spanish, MD, Farrel Gordon    .  Chronic anticoagulation 08/20/2012  . Chronic respiratory failure with hypoxia ([place]) 11/10/2017  . Colon polyps   . COPD mixed type ([place]) 03/28/2016   PFT 06/17/14- severe obstructive airways disease with slight response to bronchodilator. FVC 2.03/42%, FEV1 1.02/28%, ratio 0.5, TLC 82%, DLCO 59%  . Diabetes mellitus   . Diabetic neuropathy, type II diabetes mellitus ([place]) 09/25/2014  . Diabetic retinopathy ([place]) 05/28/2017  . 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 GERD (gastroesophageal reflux disease)   . HTN (hypertension)    x 3 years  . Hyperlipidemia   . Lung nodules 03/11/2013  . Mediastinal adenopathy 06/20/2013   CT  & PET 2/15   . Memory loss   . 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 NHL (non-Hodgkin's lymphoma) ([place])    nhl dx 9/11  . Obesity    exogenous  . Obstructive sleep apnea 05/25/2014   NPSG-  04/07/2014-severe obstructive sleep apnea-AHI 75.1 per hour. CPAP titrated to 16. He required supplemental oxygen at 2 L/ [employer] which he already wears for sleep at home. Weight 338 pounds   . Peripheral vascular insufficiency (  [place]) 08/02/2017  . Personal history of colonic polyps 03/02/2009   Qualifier: Diagnosis of  By: Deatra Ina MD, Sandy Salaam   . Sepsis ([place]) 06/07/2015  . Severe obesity (BMI >= 40) ([place]) 05/01/2013  . Shingles   . Sleep apnea    CPAP machine is broken  . Thrombocytopenia ([place]) 11/23/2016    Past Surgical History:  Procedure Laterality Date  . APPENDECTOMY    . COLONOSCOPY WITH PROPOFOL N/A 05/05/2014   Procedure: COLONOSCOPY WITH PROPOFOL;  Surgeon: Inda Castle, MD;  Location: [facility] ENDOSCOPY;  Service: Endoscopy;  Laterality: N/A;     Current Outpatient Medications  Medication Sig Dispense Refill  . acetaminophen (TYLENOL) 325 MG tablet Take 650 mg by mouth every 6 (six) hours as needed.    Marland Kitchen albuterol (VENTOLIN HFA) 108 (90 Base) MCG/ACT inhaler Inhale 2 puffs into the lungs every 4 (four) hours  as needed for wheezing or shortness of breath.    . ALPRAZolam (XANAX) 0.25 MG tablet Take 0.25 mg by mouth 3 (three) times daily as needed.    . bisoprolol (ZEBETA) 5 MG tablet Take 1 tablet (5 mg total) by mouth 2 (two) times daily. 60 tablet 0  . budesonide (PULMICORT) 0.25 MG/2ML nebulizer solution Take 2 mLs (0.25 mg total) by nebulization 2 (two) times daily. 120 mL 12  . ELIQUIS 5 MG TABS tablet Take 1 tablet by mouth twice daily 60 tablet 2  . escitalopram (LEXAPRO) 20 MG tablet Take 1 tablet (20 mg total) by mouth daily. 30 tablet 2  . Fluticasone-Umeclidin-Vilant (TRELEGY ELLIPTA) 100-62.5-25 MCG/INH AEPB Inhale into the lungs.    . furosemide (LASIX) 40 MG tablet Take 1 tablet (40 mg total) by mouth 2 (two) times daily. 60 tablet 1  . insulin regular (NOVOLIN R) 100 units/mL injection Inject 0.08 mLs (8 Units total) into the skin 3 (three) times daily before meals. 10 mL 5  . ipratropium-albuterol (DUONEB) 0.5-2.5 (3) MG/3ML SOLN Take 3 mLs by nebulization every 6 (six) hours as needed. 1080 mL 3  . nystatin (MYCOSTATIN/NYSTOP) powder Apply topically 2 (two) times daily. Apply to groin area and scrotum skin; keep area clean and dry. 15 g 0  . oxymetazoline (AFRIN) 0.05 % nasal spray Place 1 spray into both nostrils 2 (two) times daily as needed for congestion.    . predniSONE (DELTASONE) 10 MG tablet Take 1 tablet (10 mg total) by mouth daily with breakfast. 30 tablet 0  . Semaglutide, 1 MG/DOSE, (OZEMPIC, 1 MG/DOSE,) 2 MG/1.5ML SOPN Inject 1 mg into the skin once a week. 2 pen 11  . simvastatin (ZOCOR) 40 MG tablet Take 1 tablet by mouth once daily 90 tablet 1  . Vitamin D, Cholecalciferol, 50 MCG (2000 UT) CAPS Take 1 capsule by mouth daily.     No current facility-administered medications for this visit.    Allergies:   Avapro [irbesartan] and Lipitor [atorvastatin calcium]    ROS:  Please see the history of present illness.   Otherwise, review of systems are positive for ***.    All other systems are reviewed and negative.    PHYSICAL EXAM: VS:  There were no vitals taken for this visit. , BMI There is no height or weight on file to calculate BMI. GENERAL:  Well appearing NECK:  No jugular venous distention, waveform within normal limits, carotid upstroke brisk and symmetric, no bruits, no thyromegaly LUNGS:  Clear to auscultation bilaterally CHEST:  Unremarkable HEART:  PMI not displaced or sustained,S1 and S2  within normal limits, no S3, no S4, no clicks, no rubs, *** murmurs ABD:  Flat, positive bowel sounds normal in frequency in pitch, no bruits, no rebound, no guarding, no midline pulsatile mass, no hepatomegaly, no splenomegaly EXT:  2 plus pulses throughout, no edema, no cyanosis no clubbing     ***ENERAL:  Well appearing HEENT:  Pupils equal round and reactive, fundi not visualized, oral mucosa unremarkable NECK:  No jugular venous distention, waveform within normal limits, carotid upstroke brisk and symmetric, no bruits, no thyromegaly LYMPHATICS:  No cervical, inguinal adenopathy LUNGS:  Clear to auscultation bilaterally BACK:  No CVA tenderness CHEST:  Unremarkable HEART:  PMI not displaced or sustained,S1 and S2 within normal limits, no S3, no clicks, no rubs, no murmurs ABD:  Flat, positive bowel sounds normal in frequency in pitch, no bruits, no rebound, no guarding, no midline pulsatile mass, no hepatomegaly, no splenomegaly EXT:  2 plus pulses throughout, no edema, no cyanosis no clubbing SKIN:  No rashes no nodules NEURO:  Cranial nerves II through XII grossly intact, motor grossly intact throughout PSYCH:  Cognitively intact, oriented to person place and time    EKG:  EKG is *** ordered today. The ekg ordered *** demonstrates atrial fibrillation, rate *** axis within normal limits, early transition lead V2   Recent Labs: 07/08/2019: TSH 1.256 07/30/2019: Magnesium 1.8 08/08/2019: ALT 16; BNP 120.5; BUN 32; Creatinine, Ser 1.64;  Hemoglobin 14.8; Platelets 206; Potassium 3.6; Sodium 135    Lipid Panel    Component Value Date/Time   CHOL 169 02/20/2019 0928   CHOL 140 11/14/2012 0918   TRIG 300 (H) 02/20/2019 0928   TRIG 159 (H) 02/17/2016 1006   TRIG 296 (H) 11/14/2012 0918   HDL 44 02/20/2019 0928   HDL 47 02/17/2016 1006   HDL 43 11/14/2012 0918   CHOLHDL 3.8 02/20/2019 0928   LDLCALC 77 02/20/2019 0928   LDLCALC 49 11/28/2013 1026   LDLCALC 38 11/14/2012 0918      Wt Readings from Last 3 Encounters:  08/08/19 279 lb 6 oz (126.7 kg)  08/06/19 278 lb (126.1 kg)  07/31/19 286 lb (129.7 kg)      Other studies Reviewed: Additional studies/ records that were reviewed today include: *** Review of the above records demonstrates:  Please see elsewhere in the note.     ASSESSMENT AND PLAN:  Pericardial Effusion ***  This seemed to have resolved on the recent followup echo.  No change in therapy other than as below.  Chronic Diastolic CHF  ***  He may have some volume overload still as the etiology of some of his symptoms but I expect a lot of this is a primary pulmonary process.  I am going to change his Lasix to torsemide 60 mg in the morning and 40 in the evening.  I want him to get a basic metabolic profile in 2 days.  Permanent Atrial Fibrillation ***  He seems to have reasonable rate control.  He tolerates anticoagulation.  No change in therapy.  IDDM A1c is *** not well controlled but he is not had any recent hypoglycemia since hospitalization.  I will defer to his primary provider.  Chronic Hypoxic Respiratory Failure I am going to try to address this with his diuretic change as above.  He is to follow-up with his primary provider in 2 days.  He has follow-up with Dr. Annamaria Boots pulmonary in a couple of days after that.  He is to call 911 if he has  any acute distress.  I think he is needs to have some goals of therapy and end-of-life discussions as he seems to be worsening with frequent  hospitalizations.  Covid education ***      Current medicines are reviewed at length with the patient today.  The patient does not have concerns regarding medicines.  The following changes have been made:  As above.   Labs/ tests ordered today include: None No orders of the defined types were placed in this encounter.    Disposition:   FU with me in 3 months.     Signed, Minus Breeding, MD  08/13/2019 8:57 PM    [place]

## 2019-08-14 ENCOUNTER — Ambulatory Visit: Payer: Medicare Other | Admitting: Pharmacist

## 2019-08-14 ENCOUNTER — Other Ambulatory Visit: Payer: Self-pay

## 2019-08-14 ENCOUNTER — Ambulatory Visit: Payer: Medicare Other | Admitting: Cardiology

## 2019-08-14 ENCOUNTER — Encounter: Payer: Self-pay | Admitting: Family Medicine

## 2019-08-14 ENCOUNTER — Ambulatory Visit (INDEPENDENT_AMBULATORY_CARE_PROVIDER_SITE_OTHER): Payer: Medicare Other | Admitting: Family Medicine

## 2019-08-14 ENCOUNTER — Telehealth: Payer: Self-pay | Admitting: Pharmacist

## 2019-08-14 VITALS — BP 130/82 | HR 87 | Temp 98.2°F | Resp 22 | Ht 69.0 in | Wt 282.4 lb

## 2019-08-14 DIAGNOSIS — I5032 Chronic diastolic (congestive) heart failure: Secondary | ICD-10-CM

## 2019-08-14 DIAGNOSIS — Z794 Long term (current) use of insulin: Secondary | ICD-10-CM | POA: Diagnosis not present

## 2019-08-14 DIAGNOSIS — Z7189 Other specified counseling: Secondary | ICD-10-CM

## 2019-08-14 DIAGNOSIS — I4821 Permanent atrial fibrillation: Secondary | ICD-10-CM

## 2019-08-14 DIAGNOSIS — I313 Pericardial effusion (noninflammatory): Secondary | ICD-10-CM

## 2019-08-14 DIAGNOSIS — E118 Type 2 diabetes mellitus with unspecified complications: Secondary | ICD-10-CM

## 2019-08-14 DIAGNOSIS — E1140 Type 2 diabetes mellitus with diabetic neuropathy, unspecified: Secondary | ICD-10-CM

## 2019-08-14 LAB — GLUCOSE HEMOCUE WAIVED: Glu Hemocue Waived: 379 mg/dL — ABNORMAL HIGH (ref 65–99)

## 2019-08-14 MED ORDER — INSULIN NPH (HUMAN) (ISOPHANE) 100 UNIT/ML ~~LOC~~ SUSP: 15.0000 [IU] | Freq: Every day | SUBCUTANEOUS | 11 refills | Status: DC

## 2019-08-14 NOTE — Progress Notes (Signed)
Subjective:  Patient ID: Justin Ruiz, male    DOB: 06/25/1950  Age: 69 y.o. MRN: [id number]  CC: Follow-up (1 week Blood Sugars)   HPI Justin Ruiz presents for follow-up on his diabetes.  He still having glucose running 2-300.  Since starting the prednisone the lows have gone away and the appetite has gotten better.  He is not breathing better yet.  Pharmacy and pulmonary have determined that he should use Stiolto and a steroid nebulizer treatment.  He continues to use the Ozempic +8 units of R before each meal.  He was taken off of the Novolin and while in the hospital.  He has a lot of it at home and they would like to use that if we need to go to a longer acting agent.  I explained that a 24-hour agent such as Lantus they would prefer to use the existing supply first.  Depression screen [facility] 2/9 08/14/2019 07/04/2019 03/07/2019  Decreased Interest 0 0 1  Down, Depressed, Hopeless 0 0 1  PHQ - 2 Score 0 0 2  Altered sleeping - - 1  Tired, decreased energy - - 0  Change in appetite - - 0  Feeling bad or failure about yourself  - - 1  Trouble concentrating - - 1  Moving slowly or fidgety/restless - - 0  Suicidal thoughts - - 3  PHQ-9 Score - - -  Difficult doing work/chores - - Somewhat difficult  Some recent data might be hidden    History Justin Ruiz has a past medical history of Acute diastolic heart failure ([place]) (03/28/2016), Acute encephalopathy (03/28/2016), Acute encephalopathy (03/28/2016), Acute on chronic respiratory failure with hypoxia (HCC) (12/04/2016), Arthritis, Asthma, Atrial fibrillation ([place]), Benign neoplasm of ascending colon (05/05/2014), BPH (benign prostatic hypertrophy), CARDIOVASCULAR STUDIES, ABNORMAL (04/27/2009), Chronic anticoagulation (08/20/2012), Chronic respiratory failure with hypoxia ([place]) (11/10/2017), Colon polyps, COPD mixed type ([place]) (03/28/2016), Diabetes mellitus, Diabetic neuropathy, type II diabetes mellitus ([place]) (09/25/2014), Diabetic retinopathy ([place])  (05/28/2017), 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   He has a past surgical history that includes Appendectomy and Colonoscopy with propofol (N/A, 05/05/2014).   His family history includes Aortic aneurysm in his sister; COPD in his sister and sister; Colon cancer in his father; Colon polyps in his father; Diabetes in his maternal grandmother, mother, and sister; Heart attack in his sister; Heart disease in his mother, sister, and sister; Liver cancer in his mother; Prostate cancer in his father; Pulmonary embolism in his sister.He reports that he quit smoking about 9 months ago. His smoking use included cigarettes. He started smoking about 50 years ago. He has a 40.00 pack-year smoking history. He has never used smokeless tobacco. He reports that he does not drink alcohol or use drugs.    ROS Review of Systems  Constitutional: Negative.   HENT: Negative.   Eyes: Negative for visual disturbance.  Respiratory: Positive for shortness of breath. Negative for cough.   Cardiovascular: Negative for chest pain and leg swelling.  Gastrointestinal: Negative for abdominal pain, diarrhea, nausea and vomiting.  Genitourinary: Negative for difficulty urinating.  Musculoskeletal: Positive for arthralgias.  Skin: Negative  for rash.  Neurological: Positive for weakness. Negative for headaches.  Psychiatric/Behavioral: Negative for sleep disturbance.      Objective:  BP 130/82   Pulse 87   Temp 98.2 F (36.8 C) (Temporal)   Resp (!) 22   Ht 5\' 9"  (1.753 m)   Wt 282 lb 6.4 oz (128.1 kg)   SpO2 97% Comment: 4L O2  BMI 41.70 kg/m   BP Readings from Last 3 Encounters:  08/14/19 130/82  08/08/19 122/77  08/06/19 120/60    Wt Readings from Last 3 Encounters:  08/14/19 282 lb 6.4 oz (128.1 kg)  08/08/19 279 lb 6 oz (126.7 kg)  08/06/19 278 lb (126.1 kg)     Physical Exam Vitals reviewed.  Constitutional:      Appearance: He is well-developed. He is obese. He is ill-appearing.  HENT:     Head: Normocephalic and atraumatic.     Right Ear: Tympanic membrane and external ear normal. No decreased hearing noted.     Left Ear: Tympanic membrane and external ear normal. No decreased hearing noted.     Mouth/Throat:     Pharynx: No oropharyngeal exudate or posterior oropharyngeal erythema.  Eyes:     Pupils: Pupils are equal, round, and reactive to light.  Cardiovascular:     Rate and Rhythm: Normal rate and regular rhythm.     Heart sounds: No murmur.  Pulmonary:     Effort: No respiratory distress.     Breath sounds: Wheezing (  Decreased expiratory effort.) present.  Abdominal:     Palpations: Abdomen is soft.     Tenderness: There is no abdominal tenderness.  Musculoskeletal:     Cervical back: Normal range of motion and neck supple.  Neurological:     Mental Status: He is alert.       Assessment & Plan:   Justin Ruiz was seen today for follow-up.  Diagnoses and all orders for this visit:  Type 2 diabetes mellitus with diabetic neuropathy, with long-term current use of insulin (HCC) -     Glucose Hemocue Waived  Other orders -     insulin NPH Human (NOVOLIN N) 100 UNIT/ML injection; Inject 0.15 mLs (15 Units total) into the skin daily before supper.       I am having Justin Ruiz start on insulin NPH Human. I am also having him maintain his ipratropium-albuterol, Eliquis, simvastatin, Ozempic (1  MG/DOSE), acetaminophen, nystatin, oxymetazoline, insulin regular, bisoprolol, albuterol, predniSONE, escitalopram, furosemide, Vitamin D (Cholecalciferol), ALPRAZolam, and budesonide.  Allergies as of 08/14/2019      Reactions   Avapro [irbesartan] Other (See Comments)   "doesnt sit right with me"--light headed   Lipitor [atorvastatin Calcium] Other (See Comments)   Leg pain      Medication List       Accurate as of August 14, 2019  9:01 PM. If you have any questions, ask your nurse or doctor.        STOP taking these medications   Trelegy Ellipta 100-62.5-25 MCG/INH Aepb Generic drug: Fluticasone-Umeclidin-Vilant Stopped by: Lavera Guise, RPH     TAKE these medications   acetaminophen 325 MG tablet Commonly known as: TYLENOL Take 650 mg by mouth every 6 (six) hours as needed.   albuterol 108 (90 Base) MCG/ACT inhaler Commonly known as: VENTOLIN HFA Inhale 2 puffs into the lungs every 4 (four) hours as needed for wheezing or shortness of breath.   ALPRAZolam 0.25 MG tablet Commonly known as: XANAX Take 0.25 mg by  mouth 3 (three) times daily as needed.   bisoprolol 5 MG tablet Commonly known as: ZEBETA Take 1 tablet (5 mg total) by mouth 2 (two) times daily.   budesonide 0.25 MG/2ML nebulizer solution Commonly known as: PULMICORT Take 2 mLs (0.25 mg total) by nebulization 2 (two) times daily.   Eliquis 5 MG Tabs tablet Generic drug: apixaban Take 1 tablet by mouth twice daily   escitalopram 20 MG tablet Commonly known as: LEXAPRO Take 1 tablet (20 mg total) by mouth daily.   furosemide 40 MG tablet Commonly known as: LASIX Take 1 tablet (40 mg total) by mouth 2 (two) times daily.   insulin NPH Human 100 UNIT/ML injection Commonly known as: NovoLIN N Inject 0.15 mLs (15 Units total) into the skin daily before supper. Started by: [name], MD   insulin regular 100 units/mL injection Commonly known as: NOVOLIN R Inject 0.08 mLs (8 Units total) into the  skin 3 (three) times daily before meals.   ipratropium-albuterol 0.5-2.5 (3) MG/3ML Soln Commonly known as: DUONEB Take 3 mLs by nebulization every 6 (six) hours as needed.   nystatin powder Commonly known as: MYCOSTATIN/NYSTOP Apply topically 2 (two) times daily. Apply to groin area and scrotum skin; keep area clean and dry.   oxymetazoline 0.05 % nasal spray Commonly known as: AFRIN Place 1 spray into both nostrils 2 (two) times daily as needed for congestion.   Ozempic (1 MG/DOSE) 2 MG/1.5ML Sopn Generic drug: Semaglutide (1 MG/DOSE) Inject 1 mg into the skin once a week.   predniSONE 10 MG tablet Commonly known as: DELTASONE Take 1 tablet (10 mg total) by mouth daily with breakfast.   simvastatin 40 MG tablet Commonly known as: ZOCOR Take 1 tablet by mouth once daily   Stiolto Respimat 2.5-2.5 MCG/ACT Aers Generic drug: Tiotropium Bromide-Olodaterol Inhale 2 puffs into the lungs daily. #28 metered inhalations LOT 11/21 [id number] B sample   Vitamin D (Cholecalciferol) 50 MCG (2000 UT) Caps Take 1 capsule by mouth daily.      To our clinical pharmacist [name] we were able to get him Stiolto and Pulmicort.  This was going to be more affordable for him than the Trelegy which will be discontinued.  His Novolin and will be added back at 15 units before supper.  He will follow-up in a week.  He is supposed to bring in his glucose readings at that time.  He should report any hypoglycemic episodes.  Follow-up: Return in about 1 week (around 08/21/2019).  [name], M.D.

## 2019-08-15 DIAGNOSIS — L03115 Cellulitis of right lower limb: Secondary | ICD-10-CM | POA: Diagnosis not present

## 2019-08-15 DIAGNOSIS — J449 Chronic obstructive pulmonary disease, unspecified: Secondary | ICD-10-CM | POA: Diagnosis not present

## 2019-08-15 DIAGNOSIS — D472 Monoclonal gammopathy: Secondary | ICD-10-CM | POA: Diagnosis not present

## 2019-08-15 DIAGNOSIS — E785 Hyperlipidemia, unspecified: Secondary | ICD-10-CM | POA: Diagnosis not present

## 2019-08-15 DIAGNOSIS — J9621 Acute and chronic respiratory failure with hypoxia: Secondary | ICD-10-CM | POA: Diagnosis not present

## 2019-08-15 DIAGNOSIS — D696 Thrombocytopenia, unspecified: Secondary | ICD-10-CM | POA: Diagnosis not present

## 2019-08-15 DIAGNOSIS — G4733 Obstructive sleep apnea (adult) (pediatric): Secondary | ICD-10-CM | POA: Diagnosis not present

## 2019-08-15 DIAGNOSIS — I5033 Acute on chronic diastolic (congestive) heart failure: Secondary | ICD-10-CM | POA: Diagnosis not present

## 2019-08-15 DIAGNOSIS — E1151 Type 2 diabetes mellitus with diabetic peripheral angiopathy without gangrene: Secondary | ICD-10-CM | POA: Diagnosis not present

## 2019-08-15 DIAGNOSIS — E1140 Type 2 diabetes mellitus with diabetic neuropathy, unspecified: Secondary | ICD-10-CM | POA: Diagnosis not present

## 2019-08-15 DIAGNOSIS — E1165 Type 2 diabetes mellitus with hyperglycemia: Secondary | ICD-10-CM | POA: Diagnosis not present

## 2019-08-15 DIAGNOSIS — K219 Gastro-esophageal reflux disease without esophagitis: Secondary | ICD-10-CM | POA: Diagnosis not present

## 2019-08-15 DIAGNOSIS — M1990 Unspecified osteoarthritis, unspecified site: Secondary | ICD-10-CM | POA: Diagnosis not present

## 2019-08-15 DIAGNOSIS — R911 Solitary pulmonary nodule: Secondary | ICD-10-CM | POA: Diagnosis not present

## 2019-08-15 DIAGNOSIS — I482 Chronic atrial fibrillation, unspecified: Secondary | ICD-10-CM | POA: Diagnosis not present

## 2019-08-15 DIAGNOSIS — Z7951 Long term (current) use of inhaled steroids: Secondary | ICD-10-CM | POA: Diagnosis not present

## 2019-08-15 DIAGNOSIS — I872 Venous insufficiency (chronic) (peripheral): Secondary | ICD-10-CM | POA: Diagnosis not present

## 2019-08-15 DIAGNOSIS — E11319 Type 2 diabetes mellitus with unspecified diabetic retinopathy without macular edema: Secondary | ICD-10-CM | POA: Diagnosis not present

## 2019-08-15 DIAGNOSIS — I110 Hypertensive heart disease with heart failure: Secondary | ICD-10-CM | POA: Diagnosis not present

## 2019-08-15 DIAGNOSIS — L03116 Cellulitis of left lower limb: Secondary | ICD-10-CM | POA: Diagnosis not present

## 2019-08-15 DIAGNOSIS — Z7901 Long term (current) use of anticoagulants: Secondary | ICD-10-CM | POA: Diagnosis not present

## 2019-08-15 DIAGNOSIS — Z794 Long term (current) use of insulin: Secondary | ICD-10-CM | POA: Diagnosis not present

## 2019-08-15 NOTE — Telephone Encounter (Signed)
   PHARMACY CLINIC  08/14/2019 Name: AUNDREY EHRHARDT MRN: [id number] DOB: Feb 11, 1951   Reviewed medications with patient and caregiver.  Collaboration with [facility] PharmD, Drexel Iha, regarding COPD regimen changes.  Travel to [place] to [employer] has been difficult for patient Research officer, trade union.  Patient will transition to Stiolto Respimat inhaler (2 puffs once daily) with Budesonide nebs (BID).  Trelegy will be discontinued once plan above is initiated.  They will plan to start in the AM after nebs from [place] have been picked up.  Extensive education provided to patient and caregiver re: Stiolto.  Stiolto sample was given #28 metered inhalations EXP11/21 [id number].  Caregiver was able to demonstrate proper setup/use, patient engaged .  Stiolto training video was also played for patient.  Patient to apply for PAP via BI.   Discussed requirements and future steps.  Caregiver to bring back forms and financials.  Will fax to Pulm MD at that time.  Medication list reconciled to reflect the above changes.  New medication list printed for patient with updates highlighted.  Contact information provided if additional questions arise.   Regina Eck, PharmD, BCPS Clinical Pharmacist, [place]  II Phone [phone]

## 2019-08-16 ENCOUNTER — Other Ambulatory Visit: Payer: Self-pay | Admitting: Pharmacy Technician

## 2019-08-16 ENCOUNTER — Telehealth: Payer: Self-pay | Admitting: Family Medicine

## 2019-08-16 DIAGNOSIS — R911 Solitary pulmonary nodule: Secondary | ICD-10-CM | POA: Diagnosis not present

## 2019-08-16 DIAGNOSIS — I5033 Acute on chronic diastolic (congestive) heart failure: Secondary | ICD-10-CM | POA: Diagnosis not present

## 2019-08-16 DIAGNOSIS — L03116 Cellulitis of left lower limb: Secondary | ICD-10-CM | POA: Diagnosis not present

## 2019-08-16 DIAGNOSIS — E1151 Type 2 diabetes mellitus with diabetic peripheral angiopathy without gangrene: Secondary | ICD-10-CM | POA: Diagnosis not present

## 2019-08-16 DIAGNOSIS — E11319 Type 2 diabetes mellitus with unspecified diabetic retinopathy without macular edema: Secondary | ICD-10-CM | POA: Diagnosis not present

## 2019-08-16 DIAGNOSIS — J449 Chronic obstructive pulmonary disease, unspecified: Secondary | ICD-10-CM | POA: Diagnosis not present

## 2019-08-16 DIAGNOSIS — Z7901 Long term (current) use of anticoagulants: Secondary | ICD-10-CM | POA: Diagnosis not present

## 2019-08-16 DIAGNOSIS — M1990 Unspecified osteoarthritis, unspecified site: Secondary | ICD-10-CM | POA: Diagnosis not present

## 2019-08-16 DIAGNOSIS — E1140 Type 2 diabetes mellitus with diabetic neuropathy, unspecified: Secondary | ICD-10-CM | POA: Diagnosis not present

## 2019-08-16 DIAGNOSIS — E1165 Type 2 diabetes mellitus with hyperglycemia: Secondary | ICD-10-CM | POA: Diagnosis not present

## 2019-08-16 DIAGNOSIS — I110 Hypertensive heart disease with heart failure: Secondary | ICD-10-CM | POA: Diagnosis not present

## 2019-08-16 DIAGNOSIS — E785 Hyperlipidemia, unspecified: Secondary | ICD-10-CM | POA: Diagnosis not present

## 2019-08-16 DIAGNOSIS — L03115 Cellulitis of right lower limb: Secondary | ICD-10-CM | POA: Diagnosis not present

## 2019-08-16 DIAGNOSIS — D696 Thrombocytopenia, unspecified: Secondary | ICD-10-CM | POA: Diagnosis not present

## 2019-08-16 DIAGNOSIS — I872 Venous insufficiency (chronic) (peripheral): Secondary | ICD-10-CM | POA: Diagnosis not present

## 2019-08-16 DIAGNOSIS — D472 Monoclonal gammopathy: Secondary | ICD-10-CM | POA: Diagnosis not present

## 2019-08-16 DIAGNOSIS — Z794 Long term (current) use of insulin: Secondary | ICD-10-CM | POA: Diagnosis not present

## 2019-08-16 DIAGNOSIS — G4733 Obstructive sleep apnea (adult) (pediatric): Secondary | ICD-10-CM | POA: Diagnosis not present

## 2019-08-16 DIAGNOSIS — K219 Gastro-esophageal reflux disease without esophagitis: Secondary | ICD-10-CM | POA: Diagnosis not present

## 2019-08-16 DIAGNOSIS — J9621 Acute and chronic respiratory failure with hypoxia: Secondary | ICD-10-CM | POA: Diagnosis not present

## 2019-08-16 DIAGNOSIS — Z7951 Long term (current) use of inhaled steroids: Secondary | ICD-10-CM | POA: Diagnosis not present

## 2019-08-16 DIAGNOSIS — I482 Chronic atrial fibrillation, unspecified: Secondary | ICD-10-CM | POA: Diagnosis not present

## 2019-08-16 NOTE — Telephone Encounter (Signed)
FYI for provider

## 2019-08-16 NOTE — Telephone Encounter (Signed)
Justin Ruiz called from [facility] to let Dr Livia Snellen know that the Occupational Therapist made a visit to see patient today and pt said he was having a hard time breathing and did not want to do physical therapy today. Gershon Mussel is marking pt as missed visit for today.

## 2019-08-16 NOTE — Patient Outreach (Signed)
[facility]) Care Management  08/16/2019  Justin Ruiz [birth year] [id number]   Successful call placed to patient's spouse Justin Ruiz regarding patient assistance receipt of attestation form from [place] for Proventil HFA, HIPAA identifiers verified.   Justin Ruiz informed they had received the attestation form from [place] but had not completed it yet. Informed Justin Ruiz to call me if she had questions about it but to be sure to sign it and include all other forms they sent with the attestation form back to [employer]. Justin Ruiz informed she hopes to have it in the mail by end of the weekend. Confirmed she had phone number if questions arose about the form.  Follow up:  Will follow up with [employer] in 15-20 business days to confirm attestation form was received and to inquire about applicaiton status.  Darrold Bezek P. [name], [place]  [phone]

## 2019-08-19 ENCOUNTER — Other Ambulatory Visit: Payer: Medicare Other

## 2019-08-20 ENCOUNTER — Telehealth: Payer: Self-pay | Admitting: Pharmacy Technician

## 2019-08-20 DIAGNOSIS — Z7951 Long term (current) use of inhaled steroids: Secondary | ICD-10-CM | POA: Diagnosis not present

## 2019-08-20 DIAGNOSIS — R911 Solitary pulmonary nodule: Secondary | ICD-10-CM | POA: Diagnosis not present

## 2019-08-20 DIAGNOSIS — L03116 Cellulitis of left lower limb: Secondary | ICD-10-CM | POA: Diagnosis not present

## 2019-08-20 DIAGNOSIS — I872 Venous insufficiency (chronic) (peripheral): Secondary | ICD-10-CM | POA: Diagnosis not present

## 2019-08-20 DIAGNOSIS — I5033 Acute on chronic diastolic (congestive) heart failure: Secondary | ICD-10-CM | POA: Diagnosis not present

## 2019-08-20 DIAGNOSIS — I482 Chronic atrial fibrillation, unspecified: Secondary | ICD-10-CM | POA: Diagnosis not present

## 2019-08-20 DIAGNOSIS — E785 Hyperlipidemia, unspecified: Secondary | ICD-10-CM | POA: Diagnosis not present

## 2019-08-20 DIAGNOSIS — J449 Chronic obstructive pulmonary disease, unspecified: Secondary | ICD-10-CM | POA: Diagnosis not present

## 2019-08-20 DIAGNOSIS — D472 Monoclonal gammopathy: Secondary | ICD-10-CM | POA: Diagnosis not present

## 2019-08-20 DIAGNOSIS — I110 Hypertensive heart disease with heart failure: Secondary | ICD-10-CM | POA: Diagnosis not present

## 2019-08-20 DIAGNOSIS — J9621 Acute and chronic respiratory failure with hypoxia: Secondary | ICD-10-CM | POA: Diagnosis not present

## 2019-08-20 DIAGNOSIS — E1140 Type 2 diabetes mellitus with diabetic neuropathy, unspecified: Secondary | ICD-10-CM | POA: Diagnosis not present

## 2019-08-20 DIAGNOSIS — K219 Gastro-esophageal reflux disease without esophagitis: Secondary | ICD-10-CM | POA: Diagnosis not present

## 2019-08-20 DIAGNOSIS — E11319 Type 2 diabetes mellitus with unspecified diabetic retinopathy without macular edema: Secondary | ICD-10-CM | POA: Diagnosis not present

## 2019-08-20 DIAGNOSIS — G4733 Obstructive sleep apnea (adult) (pediatric): Secondary | ICD-10-CM | POA: Diagnosis not present

## 2019-08-20 DIAGNOSIS — M1990 Unspecified osteoarthritis, unspecified site: Secondary | ICD-10-CM | POA: Diagnosis not present

## 2019-08-20 DIAGNOSIS — E1151 Type 2 diabetes mellitus with diabetic peripheral angiopathy without gangrene: Secondary | ICD-10-CM | POA: Diagnosis not present

## 2019-08-20 DIAGNOSIS — Z794 Long term (current) use of insulin: Secondary | ICD-10-CM | POA: Diagnosis not present

## 2019-08-20 DIAGNOSIS — L03115 Cellulitis of right lower limb: Secondary | ICD-10-CM | POA: Diagnosis not present

## 2019-08-20 DIAGNOSIS — D696 Thrombocytopenia, unspecified: Secondary | ICD-10-CM | POA: Diagnosis not present

## 2019-08-20 DIAGNOSIS — Z7901 Long term (current) use of anticoagulants: Secondary | ICD-10-CM | POA: Diagnosis not present

## 2019-08-20 DIAGNOSIS — E1165 Type 2 diabetes mellitus with hyperglycemia: Secondary | ICD-10-CM | POA: Diagnosis not present

## 2019-08-20 NOTE — Telephone Encounter (Signed)
Submitted Patient Assistance Application to [employer] for [employer] along with provider portion, PA and income documents. Will update patient when we receive a response.  Fax# [phone] Phone# [phone]

## 2019-08-21 ENCOUNTER — Ambulatory Visit (INDEPENDENT_AMBULATORY_CARE_PROVIDER_SITE_OTHER): Payer: Medicare Other | Admitting: Family Medicine

## 2019-08-21 ENCOUNTER — Telehealth: Payer: Medicare Other

## 2019-08-21 ENCOUNTER — Encounter: Payer: Self-pay | Admitting: Family Medicine

## 2019-08-21 ENCOUNTER — Telehealth: Payer: Self-pay | Admitting: Family Medicine

## 2019-08-21 ENCOUNTER — Other Ambulatory Visit: Payer: Self-pay

## 2019-08-21 VITALS — BP 131/89 | HR 99 | Temp 97.3°F | Resp 20 | Ht 69.0 in | Wt 283.5 lb

## 2019-08-21 DIAGNOSIS — D472 Monoclonal gammopathy: Secondary | ICD-10-CM | POA: Diagnosis not present

## 2019-08-21 DIAGNOSIS — E1140 Type 2 diabetes mellitus with diabetic neuropathy, unspecified: Secondary | ICD-10-CM | POA: Diagnosis not present

## 2019-08-21 DIAGNOSIS — K219 Gastro-esophageal reflux disease without esophagitis: Secondary | ICD-10-CM | POA: Diagnosis not present

## 2019-08-21 DIAGNOSIS — E11319 Type 2 diabetes mellitus with unspecified diabetic retinopathy without macular edema: Secondary | ICD-10-CM

## 2019-08-21 DIAGNOSIS — I872 Venous insufficiency (chronic) (peripheral): Secondary | ICD-10-CM | POA: Diagnosis not present

## 2019-08-21 DIAGNOSIS — R911 Solitary pulmonary nodule: Secondary | ICD-10-CM | POA: Diagnosis not present

## 2019-08-21 DIAGNOSIS — Z794 Long term (current) use of insulin: Secondary | ICD-10-CM | POA: Diagnosis not present

## 2019-08-21 DIAGNOSIS — L03115 Cellulitis of right lower limb: Secondary | ICD-10-CM | POA: Diagnosis not present

## 2019-08-21 DIAGNOSIS — I5032 Chronic diastolic (congestive) heart failure: Secondary | ICD-10-CM | POA: Diagnosis not present

## 2019-08-21 DIAGNOSIS — J9621 Acute and chronic respiratory failure with hypoxia: Secondary | ICD-10-CM | POA: Diagnosis not present

## 2019-08-21 DIAGNOSIS — E1165 Type 2 diabetes mellitus with hyperglycemia: Secondary | ICD-10-CM | POA: Diagnosis not present

## 2019-08-21 DIAGNOSIS — I482 Chronic atrial fibrillation, unspecified: Secondary | ICD-10-CM | POA: Diagnosis not present

## 2019-08-21 DIAGNOSIS — M1990 Unspecified osteoarthritis, unspecified site: Secondary | ICD-10-CM | POA: Diagnosis not present

## 2019-08-21 DIAGNOSIS — G4733 Obstructive sleep apnea (adult) (pediatric): Secondary | ICD-10-CM | POA: Diagnosis not present

## 2019-08-21 DIAGNOSIS — I5033 Acute on chronic diastolic (congestive) heart failure: Secondary | ICD-10-CM | POA: Diagnosis not present

## 2019-08-21 DIAGNOSIS — L03116 Cellulitis of left lower limb: Secondary | ICD-10-CM | POA: Diagnosis not present

## 2019-08-21 DIAGNOSIS — J449 Chronic obstructive pulmonary disease, unspecified: Secondary | ICD-10-CM

## 2019-08-21 DIAGNOSIS — E785 Hyperlipidemia, unspecified: Secondary | ICD-10-CM | POA: Diagnosis not present

## 2019-08-21 DIAGNOSIS — D696 Thrombocytopenia, unspecified: Secondary | ICD-10-CM | POA: Diagnosis not present

## 2019-08-21 DIAGNOSIS — Z7901 Long term (current) use of anticoagulants: Secondary | ICD-10-CM | POA: Diagnosis not present

## 2019-08-21 DIAGNOSIS — I110 Hypertensive heart disease with heart failure: Secondary | ICD-10-CM | POA: Diagnosis not present

## 2019-08-21 DIAGNOSIS — E1151 Type 2 diabetes mellitus with diabetic peripheral angiopathy without gangrene: Secondary | ICD-10-CM | POA: Diagnosis not present

## 2019-08-21 DIAGNOSIS — Z7951 Long term (current) use of inhaled steroids: Secondary | ICD-10-CM | POA: Diagnosis not present

## 2019-08-21 MED ORDER — INSULIN NPH (HUMAN) (ISOPHANE) 100 UNIT/ML ~~LOC~~ SUSP: 20.0000 [IU] | Freq: Every day | SUBCUTANEOUS | 11 refills | Status: DC

## 2019-08-21 NOTE — Progress Notes (Addendum)
Subjective:  Patient ID: Justin Ruiz, male    DOB: 1951-02-14  Age: 69 y.o. MRN: [id number]  CC: No chief complaint on file.   HPI Justin Ruiz presents for recheck of his COPD and diabetes.  He is breathing better currently.  Our clinical pharmacist in conjunction with chronic care management was able to arrange for him to have appropriate inhalers.  He is now using Stiolto, Pulmicort and short acting albuterol.  That seems to made a difference.  He was able to go into the kitchen and make himself some chicken and dumplings 1 day this week.  However he remains weak which is his baseline.  The 2 diagnoses of COPD and diabetes are conflicting at this point because through his end-stage COPD he has failed trials of everything other than prednisone.  Unfortunately of course the prednisone tends to exacerbate his blood sugars.  He is now getting Novolin and and Novolin R through chronic care management efforts for him.  He is taking the and at suppertime.  His blood sugar log shows that he is having some highs through the days as well.  Some lows but much less than at his last check.  He does continue to use the semaglutide/Ozempic.  Depression screen [facility] 2/9 08/27/2019 08/21/2019 08/14/2019  Decreased Interest 0 0 0  Down, Depressed, Hopeless 0 0 0  PHQ - 2 Score 0 0 0  Altered sleeping - - -  Tired, decreased energy - - -  Change in appetite - - -  Feeling bad or failure about yourself  - - -  Trouble concentrating - - -  Moving slowly or fidgety/restless - - -  Suicidal thoughts - - -  PHQ-9 Score - - -  Difficult doing work/chores - - -  Some recent data might be hidden    History Justin Ruiz has a past medical history of Acute diastolic heart failure ([place]) (03/28/2016), Acute encephalopathy (03/28/2016), Acute encephalopathy (03/28/2016), Acute on chronic respiratory failure with hypoxia ([place]) (12/04/2016), Arthritis, Asthma, Atrial fibrillation ([place]), Benign neoplasm of ascending colon  (05/05/2014), BPH (benign prostatic hypertrophy), CARDIOVASCULAR STUDIES, ABNORMAL (04/27/2009), Chronic anticoagulation (08/20/2012), Chronic respiratory failure with hypoxia ([place]) (11/10/2017), Colon polyps, COPD mixed type ([place]) (03/28/2016), Diabetes mellitus, Diabetic neuropathy, type II diabetes mellitus ([place]) (09/25/2014), Diabetic retinopathy ([place]) (05/28/2017), 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   He has a past surgical history that includes Appendectomy and Colonoscopy with propofol (N/A, 05/05/2014).   His family history includes Aortic aneurysm in his sister; COPD in his sister and sister; Colon cancer in his father; Colon polyps in his father; Diabetes in his maternal grandmother, mother, and sister; Heart attack in his sister; Heart disease in his mother, sister, and sister; Liver cancer in his mother; Prostate cancer in his father; Pulmonary embolism in his sister.He reports that he quit smoking about 10 months ago. His smoking use included cigarettes. He started smoking about 50 years ago. He has a 40.00 pack-year smoking history. He has never used smokeless tobacco. He reports that he does not drink alcohol or use drugs.    ROS Review of Systems  Constitutional:  Positive for fatigue. Negative for chills, diaphoresis and fever.  Respiratory: Positive for shortness of breath and wheezing.   Cardiovascular: Negative for chest pain.    Musculoskeletal: Negative for arthralgias.  Skin: Negative for rash.  Neurological: Positive for weakness.    Objective:  BP 131/89   Pulse 99   Temp (!) 97.3 F (36.3 C) (Temporal)   Resp 20   Ht 5\' 9"  (1.753 m)   Wt 283 lb 8 oz (128.6 kg)   SpO2 94%   BMI 41.87 kg/m   BP Readings from Last 3 Encounters:  08/27/19 138/80  08/21/19 131/89  08/14/19 130/82    Wt Readings from Last 3 Encounters:  08/27/19 285 lb 3.2 oz (129.4 kg)  08/21/19 283 lb 8 oz (128.6 kg)  08/14/19 282 lb 6.4 oz (128.1 kg)     Physical Exam Vitals reviewed.  Constitutional:      Appearance: He is well-developed.  HENT:     Head: Normocephalic and atraumatic.     Right Ear: Tympanic membrane and external ear normal. No decreased hearing noted.     Left Ear: Tympanic membrane and external ear normal. No decreased hearing noted.     Mouth/Throat:     Pharynx: No oropharyngeal exudate or posterior oropharyngeal erythema.  Eyes:     Pupils: Pupils are equal, round, and reactive to light.  Cardiovascular:     Rate and Rhythm: Normal rate and regular rhythm.     Heart sounds: No murmur.  Pulmonary:     Effort: No respiratory distress.     Breath sounds: Wheezing and rhonchi present.     Comments: Overall breath sounds are diminished/distant.  There is that decreased expiratory phase as well.  Patient however is looking more comfortable and breathing more easily than his last check. Abdominal:     General: Bowel sounds are normal.     Palpations: Abdomen is soft. There is no mass.     Tenderness: There is no abdominal tenderness.  Musculoskeletal:     Cervical back: Normal range of motion and neck supple.       Assessment & Plan:   Diagnoses and all orders for this visit:  COPD GOLD III  Chronic diastolic heart failure (HCC)  Obesity, morbid ([place])  Diabetic retinopathy of both eyes associated with type 2 diabetes mellitus, macular edema presence unspecified, unspecified retinopathy  severity (HCC) -     insulin NPH Human (NOVOLIN N) 100 UNIT/ML injection; Inject 0.2 mLs (20 Units total) into the skin daily before supper.   COPD and CHF medications remain the same.  He is wheelchair-bound due to his multiple diagnoses and morbid obesity causing instability and weakness.    I have changed Justin Ruiz's insulin NPH Human. I am also having him maintain his ipratropium-albuterol, Eliquis, simvastatin, Ozempic (1 MG/DOSE), acetaminophen, nystatin, oxymetazoline, insulin regular, bisoprolol, albuterol, predniSONE, escitalopram, furosemide, Vitamin D (Cholecalciferol), ALPRAZolam, budesonide, and Stiolto Respimat.  Allergies as of 08/21/2019      Reactions   Avapro [irbesartan] Other (See Comments)   "doesnt sit right with me"--light headed   Lipitor [atorvastatin Calcium] Other (See Comments)   Leg pain      Medication List       Accurate as of August 21, 2019 11:59 PM. If you have any questions, ask your nurse or doctor.        acetaminophen 325 MG tablet Commonly known as: TYLENOL Take 650 mg by mouth every 6 (six) hours as needed.  albuterol 108 (90 Base) MCG/ACT inhaler Commonly known as: VENTOLIN HFA Inhale 2 puffs into the lungs every 4 (four) hours as needed for wheezing or shortness of breath.   ALPRAZolam 0.25 MG tablet Commonly known as: XANAX Take 0.25 mg by mouth 3 (three) times daily as needed.   bisoprolol 5 MG tablet Commonly known as: ZEBETA Take 1 tablet (5 mg total) by mouth 2 (two) times daily.   budesonide 0.25 MG/2ML nebulizer solution Commonly known as: PULMICORT Take 2 mLs (0.25 mg total) by nebulization 2 (two) times daily.   Eliquis 5 MG Tabs tablet Generic drug: apixaban Take 1 tablet by mouth twice daily   escitalopram 20 MG tablet Commonly known as: LEXAPRO Take 1 tablet (20 mg total) by mouth daily.   furosemide 40 MG tablet Commonly known as: LASIX Take 1 tablet (40 mg total) by mouth 2 (two) times daily.     insulin NPH Human 100 UNIT/ML injection Commonly known as: NovoLIN N Inject 0.2 mLs (20 Units total) into the skin daily before supper. What changed: how much to take Changed by: [name], MD   insulin regular 100 units/mL injection Commonly known as: NOVOLIN R Inject 0.08 mLs (8 Units total) into the skin 3 (three) times daily before meals.   ipratropium-albuterol 0.5-2.5 (3) MG/3ML Soln Commonly known as: DUONEB Take 3 mLs by nebulization every 6 (six) hours as needed.   nystatin powder Commonly known as: MYCOSTATIN/NYSTOP Apply topically 2 (two) times daily. Apply to groin area and scrotum skin; keep area clean and dry.   oxymetazoline 0.05 % nasal spray Commonly known as: AFRIN Place 1 spray into both nostrils 2 (two) times daily as needed for congestion.   Ozempic (1 MG/DOSE) 2 MG/1.5ML Sopn Generic drug: Semaglutide (1 MG/DOSE) Inject 1 mg into the skin once a week.   predniSONE 10 MG tablet Commonly known as: DELTASONE Take 1 tablet (10 mg total) by mouth daily with breakfast.   simvastatin 40 MG tablet Commonly known as: ZOCOR Take 1 tablet by mouth once daily   Stiolto Respimat 2.5-2.5 MCG/ACT Aers Generic drug: Tiotropium Bromide-Olodaterol Inhale 2 puffs into the lungs daily. #28 metered inhalations LOT 11/21 [id number] B sample   Vitamin D (Cholecalciferol) 50 MCG (2000 UT) Caps Take 1 capsule by mouth daily.        Follow-up: Return in about 1 week (around 08/28/2019).  [name], M.D.

## 2019-08-21 NOTE — Telephone Encounter (Signed)
Appt made

## 2019-08-22 DIAGNOSIS — J449 Chronic obstructive pulmonary disease, unspecified: Secondary | ICD-10-CM | POA: Diagnosis not present

## 2019-08-22 DIAGNOSIS — I872 Venous insufficiency (chronic) (peripheral): Secondary | ICD-10-CM | POA: Diagnosis not present

## 2019-08-22 DIAGNOSIS — E1140 Type 2 diabetes mellitus with diabetic neuropathy, unspecified: Secondary | ICD-10-CM | POA: Diagnosis not present

## 2019-08-22 DIAGNOSIS — D472 Monoclonal gammopathy: Secondary | ICD-10-CM | POA: Diagnosis not present

## 2019-08-22 DIAGNOSIS — M1990 Unspecified osteoarthritis, unspecified site: Secondary | ICD-10-CM | POA: Diagnosis not present

## 2019-08-22 DIAGNOSIS — J9621 Acute and chronic respiratory failure with hypoxia: Secondary | ICD-10-CM | POA: Diagnosis not present

## 2019-08-22 DIAGNOSIS — I482 Chronic atrial fibrillation, unspecified: Secondary | ICD-10-CM | POA: Diagnosis not present

## 2019-08-22 DIAGNOSIS — E785 Hyperlipidemia, unspecified: Secondary | ICD-10-CM | POA: Diagnosis not present

## 2019-08-22 DIAGNOSIS — Z794 Long term (current) use of insulin: Secondary | ICD-10-CM | POA: Diagnosis not present

## 2019-08-22 DIAGNOSIS — G4733 Obstructive sleep apnea (adult) (pediatric): Secondary | ICD-10-CM | POA: Diagnosis not present

## 2019-08-22 DIAGNOSIS — I5033 Acute on chronic diastolic (congestive) heart failure: Secondary | ICD-10-CM | POA: Diagnosis not present

## 2019-08-22 DIAGNOSIS — E11319 Type 2 diabetes mellitus with unspecified diabetic retinopathy without macular edema: Secondary | ICD-10-CM | POA: Diagnosis not present

## 2019-08-22 DIAGNOSIS — I110 Hypertensive heart disease with heart failure: Secondary | ICD-10-CM | POA: Diagnosis not present

## 2019-08-22 DIAGNOSIS — D696 Thrombocytopenia, unspecified: Secondary | ICD-10-CM | POA: Diagnosis not present

## 2019-08-22 DIAGNOSIS — K219 Gastro-esophageal reflux disease without esophagitis: Secondary | ICD-10-CM | POA: Diagnosis not present

## 2019-08-22 DIAGNOSIS — R911 Solitary pulmonary nodule: Secondary | ICD-10-CM | POA: Diagnosis not present

## 2019-08-22 DIAGNOSIS — L03115 Cellulitis of right lower limb: Secondary | ICD-10-CM | POA: Diagnosis not present

## 2019-08-22 DIAGNOSIS — E1151 Type 2 diabetes mellitus with diabetic peripheral angiopathy without gangrene: Secondary | ICD-10-CM | POA: Diagnosis not present

## 2019-08-22 DIAGNOSIS — E1165 Type 2 diabetes mellitus with hyperglycemia: Secondary | ICD-10-CM | POA: Diagnosis not present

## 2019-08-22 DIAGNOSIS — Z7951 Long term (current) use of inhaled steroids: Secondary | ICD-10-CM | POA: Diagnosis not present

## 2019-08-22 DIAGNOSIS — L03116 Cellulitis of left lower limb: Secondary | ICD-10-CM | POA: Diagnosis not present

## 2019-08-22 DIAGNOSIS — Z7901 Long term (current) use of anticoagulants: Secondary | ICD-10-CM | POA: Diagnosis not present

## 2019-08-23 DIAGNOSIS — L03116 Cellulitis of left lower limb: Secondary | ICD-10-CM | POA: Diagnosis not present

## 2019-08-23 DIAGNOSIS — K219 Gastro-esophageal reflux disease without esophagitis: Secondary | ICD-10-CM | POA: Diagnosis not present

## 2019-08-23 DIAGNOSIS — I872 Venous insufficiency (chronic) (peripheral): Secondary | ICD-10-CM | POA: Diagnosis not present

## 2019-08-23 DIAGNOSIS — E1165 Type 2 diabetes mellitus with hyperglycemia: Secondary | ICD-10-CM | POA: Diagnosis not present

## 2019-08-23 DIAGNOSIS — D696 Thrombocytopenia, unspecified: Secondary | ICD-10-CM | POA: Diagnosis not present

## 2019-08-23 DIAGNOSIS — E785 Hyperlipidemia, unspecified: Secondary | ICD-10-CM | POA: Diagnosis not present

## 2019-08-23 DIAGNOSIS — R911 Solitary pulmonary nodule: Secondary | ICD-10-CM | POA: Diagnosis not present

## 2019-08-23 DIAGNOSIS — E1140 Type 2 diabetes mellitus with diabetic neuropathy, unspecified: Secondary | ICD-10-CM | POA: Diagnosis not present

## 2019-08-23 DIAGNOSIS — G4733 Obstructive sleep apnea (adult) (pediatric): Secondary | ICD-10-CM | POA: Diagnosis not present

## 2019-08-23 DIAGNOSIS — J9621 Acute and chronic respiratory failure with hypoxia: Secondary | ICD-10-CM | POA: Diagnosis not present

## 2019-08-23 DIAGNOSIS — I482 Chronic atrial fibrillation, unspecified: Secondary | ICD-10-CM | POA: Diagnosis not present

## 2019-08-23 DIAGNOSIS — Z7901 Long term (current) use of anticoagulants: Secondary | ICD-10-CM | POA: Diagnosis not present

## 2019-08-23 DIAGNOSIS — E1151 Type 2 diabetes mellitus with diabetic peripheral angiopathy without gangrene: Secondary | ICD-10-CM | POA: Diagnosis not present

## 2019-08-23 DIAGNOSIS — J449 Chronic obstructive pulmonary disease, unspecified: Secondary | ICD-10-CM | POA: Diagnosis not present

## 2019-08-23 DIAGNOSIS — Z7951 Long term (current) use of inhaled steroids: Secondary | ICD-10-CM | POA: Diagnosis not present

## 2019-08-23 DIAGNOSIS — D472 Monoclonal gammopathy: Secondary | ICD-10-CM | POA: Diagnosis not present

## 2019-08-23 DIAGNOSIS — L03115 Cellulitis of right lower limb: Secondary | ICD-10-CM | POA: Diagnosis not present

## 2019-08-23 DIAGNOSIS — M1990 Unspecified osteoarthritis, unspecified site: Secondary | ICD-10-CM | POA: Diagnosis not present

## 2019-08-23 DIAGNOSIS — E11319 Type 2 diabetes mellitus with unspecified diabetic retinopathy without macular edema: Secondary | ICD-10-CM | POA: Diagnosis not present

## 2019-08-23 DIAGNOSIS — I110 Hypertensive heart disease with heart failure: Secondary | ICD-10-CM | POA: Diagnosis not present

## 2019-08-23 DIAGNOSIS — Z794 Long term (current) use of insulin: Secondary | ICD-10-CM | POA: Diagnosis not present

## 2019-08-23 DIAGNOSIS — I5033 Acute on chronic diastolic (congestive) heart failure: Secondary | ICD-10-CM | POA: Diagnosis not present

## 2019-08-25 ENCOUNTER — Encounter: Payer: Self-pay | Admitting: Family Medicine

## 2019-08-27 ENCOUNTER — Encounter: Payer: Self-pay | Admitting: Family Medicine

## 2019-08-27 ENCOUNTER — Ambulatory Visit (INDEPENDENT_AMBULATORY_CARE_PROVIDER_SITE_OTHER): Payer: Medicare Other | Admitting: Family Medicine

## 2019-08-27 ENCOUNTER — Other Ambulatory Visit: Payer: Self-pay | Admitting: Family Medicine

## 2019-08-27 ENCOUNTER — Encounter: Payer: Self-pay | Admitting: Pharmacist

## 2019-08-27 ENCOUNTER — Other Ambulatory Visit: Payer: Self-pay

## 2019-08-27 VITALS — BP 138/80 | HR 94 | Temp 97.6°F | Resp 20 | Ht 69.0 in | Wt 285.2 lb

## 2019-08-27 DIAGNOSIS — E1165 Type 2 diabetes mellitus with hyperglycemia: Secondary | ICD-10-CM | POA: Diagnosis not present

## 2019-08-27 DIAGNOSIS — R911 Solitary pulmonary nodule: Secondary | ICD-10-CM | POA: Diagnosis not present

## 2019-08-27 DIAGNOSIS — I5033 Acute on chronic diastolic (congestive) heart failure: Secondary | ICD-10-CM | POA: Diagnosis not present

## 2019-08-27 DIAGNOSIS — G4733 Obstructive sleep apnea (adult) (pediatric): Secondary | ICD-10-CM | POA: Diagnosis not present

## 2019-08-27 DIAGNOSIS — M1990 Unspecified osteoarthritis, unspecified site: Secondary | ICD-10-CM | POA: Diagnosis not present

## 2019-08-27 DIAGNOSIS — E1140 Type 2 diabetes mellitus with diabetic neuropathy, unspecified: Secondary | ICD-10-CM | POA: Diagnosis not present

## 2019-08-27 DIAGNOSIS — Z7901 Long term (current) use of anticoagulants: Secondary | ICD-10-CM | POA: Diagnosis not present

## 2019-08-27 DIAGNOSIS — E1151 Type 2 diabetes mellitus with diabetic peripheral angiopathy without gangrene: Secondary | ICD-10-CM | POA: Diagnosis not present

## 2019-08-27 DIAGNOSIS — I482 Chronic atrial fibrillation, unspecified: Secondary | ICD-10-CM | POA: Diagnosis not present

## 2019-08-27 DIAGNOSIS — D696 Thrombocytopenia, unspecified: Secondary | ICD-10-CM | POA: Diagnosis not present

## 2019-08-27 DIAGNOSIS — I872 Venous insufficiency (chronic) (peripheral): Secondary | ICD-10-CM | POA: Diagnosis not present

## 2019-08-27 DIAGNOSIS — J9621 Acute and chronic respiratory failure with hypoxia: Secondary | ICD-10-CM | POA: Diagnosis not present

## 2019-08-27 DIAGNOSIS — D472 Monoclonal gammopathy: Secondary | ICD-10-CM | POA: Diagnosis not present

## 2019-08-27 DIAGNOSIS — E785 Hyperlipidemia, unspecified: Secondary | ICD-10-CM | POA: Diagnosis not present

## 2019-08-27 DIAGNOSIS — E11319 Type 2 diabetes mellitus with unspecified diabetic retinopathy without macular edema: Secondary | ICD-10-CM | POA: Diagnosis not present

## 2019-08-27 DIAGNOSIS — K219 Gastro-esophageal reflux disease without esophagitis: Secondary | ICD-10-CM | POA: Diagnosis not present

## 2019-08-27 DIAGNOSIS — I110 Hypertensive heart disease with heart failure: Secondary | ICD-10-CM | POA: Diagnosis not present

## 2019-08-27 DIAGNOSIS — J449 Chronic obstructive pulmonary disease, unspecified: Secondary | ICD-10-CM | POA: Diagnosis not present

## 2019-08-27 DIAGNOSIS — L03116 Cellulitis of left lower limb: Secondary | ICD-10-CM | POA: Diagnosis not present

## 2019-08-27 DIAGNOSIS — Z794 Long term (current) use of insulin: Secondary | ICD-10-CM

## 2019-08-27 DIAGNOSIS — L03115 Cellulitis of right lower limb: Secondary | ICD-10-CM | POA: Diagnosis not present

## 2019-08-27 DIAGNOSIS — Z7951 Long term (current) use of inhaled steroids: Secondary | ICD-10-CM | POA: Diagnosis not present

## 2019-08-27 LAB — GLUCOSE HEMOCUE WAIVED: Glu Hemocue Waived: >444 mg/dL (ref 65–99)

## 2019-08-27 MED ORDER — INSULIN NPH (HUMAN) (ISOPHANE) 100 UNIT/ML ~~LOC~~ SUSP: SUBCUTANEOUS | 11 refills | Status: DC

## 2019-08-27 NOTE — Progress Notes (Signed)
Subjective:  Patient ID: Justin Ruiz, male    DOB: 12/07/1950  Age: 68 y.o. MRN: [id number]  CC: Follow-up   HPI Justin Ruiz presents for recheck of his diabetes and COPD.  COPD seems to be getting stable.  He is able to do ADLs and some IADLs.  He continues on the inhalers noted in the med list through this week.  He is also dependent on oxygen 24/7.  Blood sugar readings were brought in and are in the 300 range consistently with 1 significantly below but a couple significantly higher.  Depression screen [facility] 2/9 08/27/2019 08/21/2019 08/14/2019  Decreased Interest 0 0 0  Down, Depressed, Hopeless 0 0 0  PHQ - 2 Score 0 0 0  Altered sleeping - - -  Tired, decreased energy - - -  Change in appetite - - -  Feeling bad or failure about yourself  - - -  Trouble concentrating - - -  Moving slowly or fidgety/restless - - -  Suicidal thoughts - - -  PHQ-9 Score - - -  Difficult doing work/chores - - -  Some recent data might be hidden    History Justin Ruiz has a past medical history of Acute diastolic heart failure ([place]) (03/28/2016), Acute encephalopathy (03/28/2016), Acute encephalopathy (03/28/2016), Acute on chronic respiratory failure with hypoxia ([place]) (12/04/2016), Arthritis, Asthma, Atrial fibrillation ([place]), Benign neoplasm of ascending colon (05/05/2014), BPH (benign prostatic hypertrophy), CARDIOVASCULAR STUDIES, ABNORMAL (04/27/2009), Chronic anticoagulation (08/20/2012), Chronic respiratory failure with hypoxia ([place]) (11/10/2017), Colon polyps, COPD mixed type ([place]) (03/28/2016), Diabetes mellitus, Diabetic neuropathy, type II diabetes mellitus ([place]) (09/25/2014), Diabetic retinopathy ([place]) (05/28/2017), 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 ([place]) (10/08/2015), GERD (gastroesophageal reflux disease), HTN (hypertension), Hyperlipidemia, Lung nodules (03/11/2013), Mediastinal adenopathy (06/20/2013), Memory loss, MGUS (monoclonal gammopathy of unknown significance) (10/08/2015),  NHL (non-Hodgkin's lymphoma) ([place]), Obesity, Obstructive sleep apnea (05/25/2014), Peripheral vascular insufficiency ([place]) (08/02/2017), Personal history of colonic polyps (03/02/2009), Sepsis ([place]) (06/07/2015), Severe obesity (BMI >= 40) ([place]) (05/01/2013), Shingles, Sleep apnea, and Thrombocytopenia ([place]) (11/23/2016).   He has a past surgical history that includes Appendectomy and Colonoscopy with propofol (N/A, 05/05/2014).   His family history includes Aortic aneurysm in his sister; COPD in his sister and sister; Colon cancer in his father; Colon polyps in his father; Diabetes in his maternal grandmother, mother, and sister; Heart attack in his sister; Heart disease in his mother, sister, and sister; Liver cancer in his mother; Prostate cancer in his father; Pulmonary embolism in his sister.He reports that he quit smoking about 10 months ago. His smoking use included cigarettes. He started smoking about 50 years ago. He has a 40.00 pack-year smoking history. He has never used smokeless tobacco. He reports that he does not drink alcohol or use drugs.    ROS Review of Systems  Objective:  BP 138/80    Pulse 94    Temp 97.6 F (36.4 C) (Temporal)    Resp 20    Ht 5\' 9"  (1.753 m)    Wt 285 lb 3.2 oz (129.4 kg)    SpO2 94%    BMI 42.12 kg/m   BP Readings from Last 3 Encounters:  08/27/19 138/80  08/21/19 131/89  08/14/19 130/82    Wt Readings from Last 3 Encounters:  08/27/19 285 lb 3.2 oz (129.4 kg)  08/21/19 283 lb 8 oz (128.6 kg)  08/14/19 282 lb 6.4 oz (128.1 kg)     Physical Exam    Assessment & Plan:  Justin Ruiz was seen today for follow-up.  Diagnoses and all orders for this visit:  Type 2 diabetes mellitus with diabetic neuropathy, with long-term current use of insulin (HCC) -     Glucose Hemocue Waived  Diabetic retinopathy of both eyes associated with type 2 diabetes mellitus, macular edema presence unspecified, unspecified retinopathy severity (HCC) -     insulin NPH Human  (NOVOLIN N) 100 UNIT/ML injection; Inject 0.2 mLs (20 Units total) into the skin daily before supper AND 0.1 mLs (10 Units total) daily before breakfast.   Glucose today is greater than 444 according to our lab.  His readings that he has been doing primarily in the morning are in the 300 range mostly.  Couple of days ago went to 159 but that is the only reading in that range.  And a couple days ago it went up to 469.  At dinnertime he is checked it 3 times that is his lunchtime, 195, 328, 291.  At his suppertime he has 3 readings this week 356 429 and 309.  As result I am going to increase his Novolin and by adding 10 units in the morning.  And we will see him back in a couple weeks.    I have changed Justin Ruiz's insulin NPH Human. I am also having him maintain his ipratropium-albuterol, Eliquis, simvastatin, Ozempic (1 MG/DOSE), acetaminophen, nystatin, oxymetazoline, insulin regular, bisoprolol, albuterol, predniSONE, escitalopram, furosemide, Vitamin D (Cholecalciferol), ALPRAZolam, budesonide, and Stiolto Respimat.  Allergies as of 08/27/2019      Reactions   Avapro [irbesartan] Other (See Comments)   "doesnt sit right with me"--light headed   Lipitor [atorvastatin Calcium] Other (See Comments)   Leg pain      Medication List       Accurate as of Aug 27, 2019  3:03 PM. If you have any questions, ask your nurse or doctor.        acetaminophen 325 MG tablet Commonly known as: TYLENOL Take 650 mg by mouth every 6 (six) hours as needed.   albuterol 108 (90 Base) MCG/ACT inhaler Commonly known as: VENTOLIN HFA Inhale 2 puffs into the lungs every 4 (four) hours as needed for wheezing or shortness of breath.   ALPRAZolam 0.25 MG tablet Commonly known as: XANAX Take 0.25 mg by mouth 3 (three) times daily as needed.   bisoprolol 5 MG tablet Commonly known as: ZEBETA Take 1 tablet (5 mg total) by mouth 2 (two) times daily.   budesonide 0.25 MG/2ML nebulizer solution Commonly  known as: PULMICORT Take 2 mLs (0.25 mg total) by nebulization 2 (two) times daily.   Eliquis 5 MG Tabs tablet Generic drug: apixaban Take 1 tablet by mouth twice daily   escitalopram 20 MG tablet Commonly known as: LEXAPRO Take 1 tablet (20 mg total) by mouth daily.   furosemide 40 MG tablet Commonly known as: LASIX Take 1 tablet (40 mg total) by mouth 2 (two) times daily.   insulin NPH Human 100 UNIT/ML injection Commonly known as: NovoLIN N Inject 0.2 mLs (20 Units total) into the skin daily before supper AND 0.1 mLs (10 Units total) daily before breakfast. What changed: See the new instructions. Changed by: [name], MD   insulin regular 100 units/mL injection Commonly known as: NOVOLIN R Inject 0.08 mLs (8 Units total) into the skin 3 (three) times daily before meals.   ipratropium-albuterol 0.5-2.5 (3) MG/3ML Soln Commonly known as: DUONEB Take 3 mLs by nebulization every 6 (six) hours as needed.  nystatin powder Commonly known as: MYCOSTATIN/NYSTOP Apply topically 2 (two) times daily. Apply to groin area and scrotum skin; keep area clean and dry.   oxymetazoline 0.05 % nasal spray Commonly known as: AFRIN Place 1 spray into both nostrils 2 (two) times daily as needed for congestion.   Ozempic (1 MG/DOSE) 2 MG/1.5ML Sopn Generic drug: Semaglutide (1 MG/DOSE) Inject 1 mg into the skin once a week.   predniSONE 10 MG tablet Commonly known as: DELTASONE Take 1 tablet (10 mg total) by mouth daily with breakfast.   simvastatin 40 MG tablet Commonly known as: ZOCOR Take 1 tablet by mouth once daily   Stiolto Respimat 2.5-2.5 MCG/ACT Aers Generic drug: Tiotropium Bromide-Olodaterol Inhale 2 puffs into the lungs daily. #28 metered inhalations LOT 11/21 [id number] B sample   Vitamin D (Cholecalciferol) 50 MCG (2000 UT) Caps Take 1 capsule by mouth daily.        Follow-up: Return in about 2 weeks (around 09/10/2019).  [name], M.D.

## 2019-08-27 NOTE — Progress Notes (Signed)
  Patient assistance application was faxed to [place] for patient's Eliquis.  Successful fax confirmation.

## 2019-08-28 DIAGNOSIS — I482 Chronic atrial fibrillation, unspecified: Secondary | ICD-10-CM | POA: Diagnosis not present

## 2019-08-28 DIAGNOSIS — J9621 Acute and chronic respiratory failure with hypoxia: Secondary | ICD-10-CM | POA: Diagnosis not present

## 2019-08-28 DIAGNOSIS — Z7951 Long term (current) use of inhaled steroids: Secondary | ICD-10-CM | POA: Diagnosis not present

## 2019-08-28 DIAGNOSIS — D696 Thrombocytopenia, unspecified: Secondary | ICD-10-CM | POA: Diagnosis not present

## 2019-08-28 DIAGNOSIS — I110 Hypertensive heart disease with heart failure: Secondary | ICD-10-CM | POA: Diagnosis not present

## 2019-08-28 DIAGNOSIS — E1151 Type 2 diabetes mellitus with diabetic peripheral angiopathy without gangrene: Secondary | ICD-10-CM | POA: Diagnosis not present

## 2019-08-28 DIAGNOSIS — R911 Solitary pulmonary nodule: Secondary | ICD-10-CM | POA: Diagnosis not present

## 2019-08-28 DIAGNOSIS — Z794 Long term (current) use of insulin: Secondary | ICD-10-CM | POA: Diagnosis not present

## 2019-08-28 DIAGNOSIS — L03115 Cellulitis of right lower limb: Secondary | ICD-10-CM | POA: Diagnosis not present

## 2019-08-28 DIAGNOSIS — I5033 Acute on chronic diastolic (congestive) heart failure: Secondary | ICD-10-CM | POA: Diagnosis not present

## 2019-08-28 DIAGNOSIS — D472 Monoclonal gammopathy: Secondary | ICD-10-CM | POA: Diagnosis not present

## 2019-08-28 DIAGNOSIS — I872 Venous insufficiency (chronic) (peripheral): Secondary | ICD-10-CM | POA: Diagnosis not present

## 2019-08-28 DIAGNOSIS — Z7901 Long term (current) use of anticoagulants: Secondary | ICD-10-CM | POA: Diagnosis not present

## 2019-08-28 DIAGNOSIS — E1140 Type 2 diabetes mellitus with diabetic neuropathy, unspecified: Secondary | ICD-10-CM | POA: Diagnosis not present

## 2019-08-28 DIAGNOSIS — G4733 Obstructive sleep apnea (adult) (pediatric): Secondary | ICD-10-CM | POA: Diagnosis not present

## 2019-08-28 DIAGNOSIS — E1165 Type 2 diabetes mellitus with hyperglycemia: Secondary | ICD-10-CM | POA: Diagnosis not present

## 2019-08-28 DIAGNOSIS — M1990 Unspecified osteoarthritis, unspecified site: Secondary | ICD-10-CM | POA: Diagnosis not present

## 2019-08-28 DIAGNOSIS — E11319 Type 2 diabetes mellitus with unspecified diabetic retinopathy without macular edema: Secondary | ICD-10-CM | POA: Diagnosis not present

## 2019-08-28 DIAGNOSIS — E785 Hyperlipidemia, unspecified: Secondary | ICD-10-CM | POA: Diagnosis not present

## 2019-08-28 DIAGNOSIS — K219 Gastro-esophageal reflux disease without esophagitis: Secondary | ICD-10-CM | POA: Diagnosis not present

## 2019-08-28 DIAGNOSIS — J449 Chronic obstructive pulmonary disease, unspecified: Secondary | ICD-10-CM | POA: Diagnosis not present

## 2019-08-28 DIAGNOSIS — L03116 Cellulitis of left lower limb: Secondary | ICD-10-CM | POA: Diagnosis not present

## 2019-08-29 ENCOUNTER — Ambulatory Visit (INDEPENDENT_AMBULATORY_CARE_PROVIDER_SITE_OTHER): Payer: Medicare Other | Admitting: *Deleted

## 2019-08-29 DIAGNOSIS — E119 Type 2 diabetes mellitus without complications: Secondary | ICD-10-CM

## 2019-08-29 DIAGNOSIS — Z794 Long term (current) use of insulin: Secondary | ICD-10-CM

## 2019-08-29 DIAGNOSIS — J449 Chronic obstructive pulmonary disease, unspecified: Secondary | ICD-10-CM

## 2019-08-29 NOTE — Chronic Care Management (AMB) (Signed)
Chronic Care Management   Follow Up Note   08/29/2019 Name: Justin Ruiz MRN: [id number] DOB: 1950-08-07  Referred by: Justin Fraise, MD Reason for referral : Chronic Care Management (RN follow up)   Justin Ruiz is a 69 y.o. year old male who is a primary care patient of Justin Ruiz, Justin Gash, MD. The CCM team was consulted for assistance with chronic disease management and care coordination needs.    Review of patient status, including review of consultants reports, relevant laboratory and other test results, and collaboration with appropriate care team members and the patient's provider was performed as part of comprehensive patient evaluation and provision of chronic care management services.    SDOH (Social Determinants of Health) assessments performed: No See Care Plan activities for detailed interventions related to Justin Ruiz)    I spoke with patient's wife, Justin Ruiz, by telephone today regarding management of his chronic medical conditions.   Outpatient Encounter Medications as of 08/29/2019  Medication Sig Note  . acetaminophen (TYLENOL) 325 MG tablet Take 650 mg by mouth every 6 (six) hours as needed.   Marland Kitchen albuterol (VENTOLIN HFA) 108 (90 Base) MCG/ACT inhaler Inhale 2 puffs into the lungs every 4 (four) hours as needed for wheezing or shortness of breath. 08/12/2019: Uses 2x per day  . ALPRAZolam (XANAX) 0.25 MG tablet Take 0.25 mg by mouth 3 (three) times daily as needed.   . bisoprolol (ZEBETA) 5 MG tablet Take 1 tablet (5 mg total) by mouth 2 (two) times daily.   . budesonide (PULMICORT) 0.25 MG/2ML nebulizer solution Take 2 mLs (0.25 mg total) by nebulization 2 (two) times daily.   Marland Kitchen ELIQUIS 5 MG TABS tablet Take 1 tablet by mouth twice daily   . escitalopram (LEXAPRO) 20 MG tablet Take 1 tablet (20 mg total) by mouth daily.   . furosemide (LASIX) 40 MG tablet Take 1 tablet (40 mg total) by mouth 2 (two) times daily.   . insulin NPH Human (NOVOLIN N) 100 UNIT/ML injection Inject 0.2  mLs (20 Units total) into the skin daily before supper AND 0.1 mLs (10 Units total) daily before breakfast.   . insulin regular (NOVOLIN R) 100 units/mL injection Inject 0.08 mLs (8 Units total) into the skin 3 (three) times daily before meals.   Marland Kitchen ipratropium-albuterol (DUONEB) 0.5-2.5 (3) MG/3ML SOLN Take 3 mLs by nebulization every 6 (six) hours as needed. 08/12/2019: Uses every 4-6 hours  . nystatin (MYCOSTATIN/NYSTOP) powder Apply topically 2 (two) times daily. Apply to groin area and scrotum skin; keep area clean and dry.   Marland Kitchen oxymetazoline (AFRIN) 0.05 % nasal spray Place 1 spray into both nostrils 2 (two) times daily as needed for congestion. 07/28/2019: On hand if needed  . predniSONE (DELTASONE) 10 MG tablet Take 1 tablet (10 mg total) by mouth daily with breakfast. 08/12/2019: Not working well 4/19  . Semaglutide, 1 MG/DOSE, (OZEMPIC, 1 MG/DOSE,) 2 MG/1.5ML SOPN Inject 1 mg into the skin once a week.   . simvastatin (ZOCOR) 40 MG tablet Take 1 tablet by mouth once daily   . Tiotropium Bromide-Olodaterol (STIOLTO RESPIMAT) 2.5-2.5 MCG/ACT AERS Inhale 2 puffs into the lungs daily. #28 metered inhalations LOT 11/21 [id number] B sample   . Vitamin D, Cholecalciferol, 50 MCG (2000 UT) CAPS Take 1 capsule by mouth daily.    No facility-administered encounter medications on file as of 08/29/2019.      RN Care Plan           This Visit's Progress  Patient Stated   . COPD Medication Management (pt-stated)       Current Barriers:  . Film/video editor.  . Chronic Disease Management support and education needs related to COPD  Nurse Case Manager Clinical Goal(s):  Marland Kitchen Over the next 30 days, patient will talk with RN Care Manager regarding COPD management  Interventions:  . Previously consulted with Dr Justin Ruiz and Justin Ruiz, Justin Ruiz regarding switch to Symbicort 160/4.5 2 puffs BID for patient prescription assistance   . Talked with patient's wife, Justin Ruiz by telephone o Reports that he is  doing much better with breathing o He is up moving around more o Still on O2 24/7 . Verified that he still has home health services and that they are encouraging him to walk around more . Reviewed and discussed medications: symbicort, prednisone . Previously provided with CCM contact information and encouraged to reach out as needed  Patient Self Care Activities:  . Self administers medications as prescribed . Performs ADL's independently . Has help from his wife for medical management  Please see past updates related to this goal by clicking on the "Past Updates" button in the selected goal      . Diabetes Management (pt-stated)       Current Barriers:  . Chronic Disease Management support and education needs related to diabetes  Nurse Case Manager Clinical Goal(s):  Marland Kitchen Over the next 30 days, patient will talk with RN Care Manager regarding diabetes medication management . Over the next 14 days, patient will follow-up with PCP regarding diabetes management  Interventions:  . Chart reviewed including recent office notes and lab results . Talked with patient's wife, Justin Ruiz by telephone . Reviewed medications and discussed increase in Novolin by 10 units each morning . Discussed effects of prednisone on blood sugar . Discussed home readings o Reports that blood sugar was 269 this morning. Readings have been mostly in the 200 which is better than before when they were anywhere from 300 to 500 . Discussed hypoglycemia identification and management o No hypoglycemic episodes since last visit . Reviewed upcoming appointment with Dr Justin Ruiz: 08/12/19  Patient Self Care Activities:  . Performs ADL's independently . Has help from his wife to manage his medical conditions and medications  Please see past updates related to this goal by clicking on the "Past Updates" button in the selected goal      . Medication Cost Management: Eliquis (pt-stated)       Current Barriers:  Marland Kitchen Knowledge  Deficits related to patient prescription assistance . Film/video editor.  . Chronic Disease Management support and education needs related to Afib  Nurse Case Manager Clinical Goal(s):  Marland Kitchen Over the next 30 days, patient will receive a response from [employer] regarding PAP application  Interventions:  . Spoke with patient's wife, Justin Ruiz by telephone o Verified that she brought in financial documents for PAP . Chart reviewed o Verified that the PAP form and documents were faxed to [employer] on 08/26/19 . Notified pauline that forms have been sent in and that she should receive a response within the next month . Encouraged patient/wife to reach out to [facility] team as needed  Patient Self Care Activities:  . Performs ADL's independently . Performs IADL's independently . Wife assists with medications and health maintenance  Please see past updates related to this goal by clicking on the "Past Updates" button in the selected goal        Other   . COMPLETED: SNF Placement  CARE PLAN ENTRY (see longtitudinal plan of care for additional care plan information)  Current Barriers:  Marland Kitchen Knowledge Deficits related to SNF placement . Decline in health and ability to care for himself . Increasing dyspnea and hypoxia with exertion . Need for SNF placement in patient with CHF, DM, COPD  Nurse Case Manager Clinical Goal(s):  Marland Kitchen Over the next 5 days, patient will work with Consulting civil engineer to address needs related to SNF Placement . Over the next 7 days, patient/family will be in communication with a SNF to discuss placement  Interventions:  . Consulted by PCP, Dr Justin Ruiz regarding patient's decline and 2 recent hospitalizations . Very thorough chart review including recent office and hospital notes, telephone calls, test results, and medication list . Called multiple local SNFs to inquire about placement . Collaborated with [facility] and [facility] [phone] regarding placement requirements and  insurance approval . FL2 completed and securely emailed to Lynnea Ferrier, LPN . Collaborated with with Lynnea Ferrier, LPN and advised to fax the [facility] and specific notes to [place] at [phone] . Talked with wife, Justin Ruiz, multiple times throughout this process  o Advised that it is not likely [facility] will approve placement from home. Most often requires transfer to SNF after hospital discharge o Advised that she should receive a call from [place] with an update o Advised that if coverage is denied, the next option would be an ED visit with readmission so that patient can be transferred to SNF at discharge  Patient Self Care Activities:  . Performs ADL's independently . Unable to independently IADLs and requires some assistance with ADLs  Initial goal documentation         Plan:   The care management team will reach out to the patient again over the next 45 days.    [name], BSN, RN-BC Embedded Chronic Care Manager [facility] / [place] [facility] Direct Dial: [phone]

## 2019-08-29 NOTE — Patient Instructions (Signed)
Visit Information  Goals Addressed            This Visit's Progress     Patient Stated   . COPD Medication Management (pt-stated)       Current Barriers:  . Film/video editor.  . Chronic Disease Management support and education needs related to COPD  Nurse Case Manager Clinical Goal(s):  Marland Kitchen Over the next 30 days, patient will talk with RN Care Manager regarding COPD management  Interventions:  . Previously consulted with Dr Livia Snellen and Denyse Amass, PharmD regarding switch to Symbicort 160/4.5 2 puffs BID for patient prescription assistance   . Talked with patient's wife, [name] by telephone o Reports that he is doing much better with breathing o He is up moving around more o Still on O2 24/7 . Verified that he still has home health services and that they are encouraging him to walk around more . Reviewed and discussed medications: symbicort, prednisone . Previously provided with CCM contact information and encouraged to reach out as needed  Patient Self Care Activities:  . Self administers medications as prescribed . Performs ADL's independently . Has help from his wife for medical management  Please see past updates related to this goal by clicking on the "Past Updates" button in the selected goal      . Diabetes Management (pt-stated)       Current Barriers:  . Chronic Disease Management support and education needs related to diabetes  Nurse Case Manager Clinical Goal(s):  Marland Kitchen Over the next 30 days, patient will talk with RN Care Manager regarding diabetes medication management . Over the next 14 days, patient will follow-up with PCP regarding diabetes management  Interventions:  . Chart reviewed including recent office notes and lab results . Talked with patient's wife, [name] by telephone . Reviewed medications and discussed increase in Novolin by 10 units each morning . Discussed effects of prednisone on blood sugar . Discussed home readings o Reports that  blood sugar was 269 this morning. Readings have been mostly in the 200 which is better than before when they were anywhere from 300 to 500 . Discussed hypoglycemia identification and management o No hypoglycemic episodes since last visit . Reviewed upcoming appointment with Dr Livia Snellen: 08/12/19  Patient Self Care Activities:  . Performs ADL's independently . Has help from his wife to manage his medical conditions and medications  Please see past updates related to this goal by clicking on the "Past Updates" button in the selected goal      . 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  . Chronic Disease Management support and education needs related to Afib  Nurse Case Manager Clinical Goal(s):  Marland Kitchen Over the next 30 days, patient will receive a response from [employer] regarding PAP application  Interventions:  . Spoke with patient's wife, [name] by telephone o Verified that she brought in financial documents for PAP . Chart reviewed o Verified that the PAP form and documents were faxed to [employer] on 08/26/19 . Notified pauline that forms have been sent in and that she should receive a response within the next month . Encouraged patient/wife to reach out to [facility] team as needed  Patient Self Care Activities:  . Performs ADL's independently . Performs IADL's independently . Wife assists with medications and health maintenance  Please see past updates related to this goal by clicking on the "Past Updates"  button in the selected goal        Other   . COMPLETED: SNF Placement       CARE PLAN ENTRY (see longtitudinal plan of care for additional care plan information)  Current Barriers:  Marland Kitchen Knowledge Deficits related to SNF placement . Decline in health and ability to care for himself . Increasing dyspnea and hypoxia with exertion . Need for SNF placement in patient with CHF,  DM, COPD  Nurse Case Manager Clinical Goal(s):  Marland Kitchen Over the next 5 days, patient will work with Consulting civil engineer to address needs related to SNF Placement . Over the next 7 days, patient/family will be in communication with a SNF to discuss placement  Interventions:  . Consulted by PCP, Dr Livia Snellen regarding patient's decline and 2 recent hospitalizations . Very thorough chart review including recent office and hospital notes, telephone calls, test results, and medication list . Called multiple local SNFs to inquire about placement . Collaborated with Shirlee Limerick and [facility] [phone] regarding placement requirements and insurance approval . FL2 completed and securely emailed to Lynnea Ferrier, LPN . Collaborated with with Lynnea Ferrier, LPN and advised to fax the [facility] and specific notes to [place] at [phone] . Talked with wife, [name], multiple times throughout this process  o Advised that it is not likely [facility] will approve placement from home. Most often requires transfer to SNF after hospital discharge o Advised that she should receive a call from [place] with an update o Advised that if coverage is denied, the next option would be an ED visit with readmission so that patient can be transferred to SNF at discharge  Patient Self Care Activities:  . Performs ADL's independently . Unable to independently IADLs and requires some assistance with ADLs  Initial goal documentation        The patient verbalized understanding of instructions provided today and declined a print copy of patient instruction materials.   Follow-up Plan The care management team will reach out to the patient again over the next 45 days.   [name], BSN, RN-BC Embedded Chronic Care Manager [facility] / [place] [facility] Direct Dial: [phone]

## 2019-08-30 ENCOUNTER — Other Ambulatory Visit: Payer: Self-pay | Admitting: Family Medicine

## 2019-08-30 DIAGNOSIS — D472 Monoclonal gammopathy: Secondary | ICD-10-CM | POA: Diagnosis not present

## 2019-08-30 DIAGNOSIS — D696 Thrombocytopenia, unspecified: Secondary | ICD-10-CM | POA: Diagnosis not present

## 2019-08-30 DIAGNOSIS — G4733 Obstructive sleep apnea (adult) (pediatric): Secondary | ICD-10-CM | POA: Diagnosis not present

## 2019-08-30 DIAGNOSIS — L03115 Cellulitis of right lower limb: Secondary | ICD-10-CM | POA: Diagnosis not present

## 2019-08-30 DIAGNOSIS — J449 Chronic obstructive pulmonary disease, unspecified: Secondary | ICD-10-CM | POA: Diagnosis not present

## 2019-08-30 DIAGNOSIS — L03116 Cellulitis of left lower limb: Secondary | ICD-10-CM | POA: Diagnosis not present

## 2019-08-30 DIAGNOSIS — Z794 Long term (current) use of insulin: Secondary | ICD-10-CM | POA: Diagnosis not present

## 2019-08-30 DIAGNOSIS — E1140 Type 2 diabetes mellitus with diabetic neuropathy, unspecified: Secondary | ICD-10-CM | POA: Diagnosis not present

## 2019-08-30 DIAGNOSIS — I110 Hypertensive heart disease with heart failure: Secondary | ICD-10-CM | POA: Diagnosis not present

## 2019-08-30 DIAGNOSIS — E1151 Type 2 diabetes mellitus with diabetic peripheral angiopathy without gangrene: Secondary | ICD-10-CM | POA: Diagnosis not present

## 2019-08-30 DIAGNOSIS — E11319 Type 2 diabetes mellitus with unspecified diabetic retinopathy without macular edema: Secondary | ICD-10-CM | POA: Diagnosis not present

## 2019-08-30 DIAGNOSIS — I482 Chronic atrial fibrillation, unspecified: Secondary | ICD-10-CM | POA: Diagnosis not present

## 2019-08-30 DIAGNOSIS — M1990 Unspecified osteoarthritis, unspecified site: Secondary | ICD-10-CM | POA: Diagnosis not present

## 2019-08-30 DIAGNOSIS — Z7951 Long term (current) use of inhaled steroids: Secondary | ICD-10-CM | POA: Diagnosis not present

## 2019-08-30 DIAGNOSIS — J9621 Acute and chronic respiratory failure with hypoxia: Secondary | ICD-10-CM | POA: Diagnosis not present

## 2019-08-30 DIAGNOSIS — Z7901 Long term (current) use of anticoagulants: Secondary | ICD-10-CM | POA: Diagnosis not present

## 2019-08-30 DIAGNOSIS — R911 Solitary pulmonary nodule: Secondary | ICD-10-CM | POA: Diagnosis not present

## 2019-08-30 DIAGNOSIS — I872 Venous insufficiency (chronic) (peripheral): Secondary | ICD-10-CM | POA: Diagnosis not present

## 2019-08-30 DIAGNOSIS — K219 Gastro-esophageal reflux disease without esophagitis: Secondary | ICD-10-CM | POA: Diagnosis not present

## 2019-08-30 DIAGNOSIS — E785 Hyperlipidemia, unspecified: Secondary | ICD-10-CM | POA: Diagnosis not present

## 2019-08-30 DIAGNOSIS — I5033 Acute on chronic diastolic (congestive) heart failure: Secondary | ICD-10-CM | POA: Diagnosis not present

## 2019-08-30 DIAGNOSIS — E1165 Type 2 diabetes mellitus with hyperglycemia: Secondary | ICD-10-CM | POA: Diagnosis not present

## 2019-08-30 NOTE — Telephone Encounter (Signed)
Received notification from  [facility] regarding an approval for Stiolto Respimat from 07/08/19 to 04/24/20.   Patient received shipment on 08/27/2019.  Phone number: [phone]

## 2019-09-02 ENCOUNTER — Telehealth: Payer: Self-pay | Admitting: *Deleted

## 2019-09-02 ENCOUNTER — Other Ambulatory Visit: Payer: Self-pay | Admitting: Pharmacy Technician

## 2019-09-02 DIAGNOSIS — E1151 Type 2 diabetes mellitus with diabetic peripheral angiopathy without gangrene: Secondary | ICD-10-CM | POA: Diagnosis not present

## 2019-09-02 DIAGNOSIS — K219 Gastro-esophageal reflux disease without esophagitis: Secondary | ICD-10-CM | POA: Diagnosis not present

## 2019-09-02 DIAGNOSIS — Z9981 Dependence on supplemental oxygen: Secondary | ICD-10-CM | POA: Diagnosis not present

## 2019-09-02 DIAGNOSIS — I5033 Acute on chronic diastolic (congestive) heart failure: Secondary | ICD-10-CM | POA: Diagnosis not present

## 2019-09-02 DIAGNOSIS — E11319 Type 2 diabetes mellitus with unspecified diabetic retinopathy without macular edema: Secondary | ICD-10-CM | POA: Diagnosis not present

## 2019-09-02 DIAGNOSIS — D472 Monoclonal gammopathy: Secondary | ICD-10-CM | POA: Diagnosis not present

## 2019-09-02 DIAGNOSIS — I110 Hypertensive heart disease with heart failure: Secondary | ICD-10-CM | POA: Diagnosis not present

## 2019-09-02 DIAGNOSIS — J9621 Acute and chronic respiratory failure with hypoxia: Secondary | ICD-10-CM | POA: Diagnosis not present

## 2019-09-02 DIAGNOSIS — D696 Thrombocytopenia, unspecified: Secondary | ICD-10-CM | POA: Diagnosis not present

## 2019-09-02 DIAGNOSIS — Z7901 Long term (current) use of anticoagulants: Secondary | ICD-10-CM | POA: Diagnosis not present

## 2019-09-02 DIAGNOSIS — E785 Hyperlipidemia, unspecified: Secondary | ICD-10-CM | POA: Diagnosis not present

## 2019-09-02 DIAGNOSIS — E1140 Type 2 diabetes mellitus with diabetic neuropathy, unspecified: Secondary | ICD-10-CM | POA: Diagnosis not present

## 2019-09-02 DIAGNOSIS — Z794 Long term (current) use of insulin: Secondary | ICD-10-CM | POA: Diagnosis not present

## 2019-09-02 DIAGNOSIS — I872 Venous insufficiency (chronic) (peripheral): Secondary | ICD-10-CM | POA: Diagnosis not present

## 2019-09-02 DIAGNOSIS — C8590 Non-Hodgkin lymphoma, unspecified, unspecified site: Secondary | ICD-10-CM | POA: Diagnosis not present

## 2019-09-02 DIAGNOSIS — J449 Chronic obstructive pulmonary disease, unspecified: Secondary | ICD-10-CM | POA: Diagnosis not present

## 2019-09-02 DIAGNOSIS — M1990 Unspecified osteoarthritis, unspecified site: Secondary | ICD-10-CM | POA: Diagnosis not present

## 2019-09-02 DIAGNOSIS — R911 Solitary pulmonary nodule: Secondary | ICD-10-CM | POA: Diagnosis not present

## 2019-09-02 DIAGNOSIS — I482 Chronic atrial fibrillation, unspecified: Secondary | ICD-10-CM | POA: Diagnosis not present

## 2019-09-02 DIAGNOSIS — S91114D Laceration without foreign body of right lesser toe(s) without damage to nail, subsequent encounter: Secondary | ICD-10-CM | POA: Diagnosis not present

## 2019-09-02 DIAGNOSIS — G4733 Obstructive sleep apnea (adult) (pediatric): Secondary | ICD-10-CM | POA: Diagnosis not present

## 2019-09-02 NOTE — Patient Outreach (Signed)
[facility]) Care Management  09/02/2019  Justin Ruiz October 04, 1950 [id number]  ADDENDUM   Successful call placed to patient's wife Justin Ruiz regarding patient assistance receipt of attestation form from [place] for Proventil HFA, HIPAA identifiers verified.   Justin Ruiz informed she thought she had mailed it back. She informed she was in the process of moving and would look thru the papers that she packed up for the office to see if she still has it. She informed she would call me back.  Follow up:  Will follow up with patient/[employer] in 5-10 business days.   [name] P. [name], [place]  [phone]

## 2019-09-02 NOTE — Patient Outreach (Signed)
[facility]) Care Management  09/02/2019  RICCI WEINZAPFEL 1950/05/07 [id number]  Care coordination call placed to [employer] in regards to patient's application for Proventil HFA.  Spoke to [place] who informed they have not received back the attestation form that the patient's wife informed she was going to mail back the week of April 26.  Will outreach patient to inquire if the attestation form was completed.  Nicky Kras P. Liliah Dorian, [place]  [phone]

## 2019-09-02 NOTE — Telephone Encounter (Signed)
VM from Justin Ruiz w/ [facility] FYI: just needed to report at visit today, pt had slight rales in LLL and non productive cough

## 2019-09-03 ENCOUNTER — Ambulatory Visit: Payer: Self-pay | Admitting: Licensed Clinical Social Worker

## 2019-09-03 DIAGNOSIS — J449 Chronic obstructive pulmonary disease, unspecified: Secondary | ICD-10-CM

## 2019-09-03 DIAGNOSIS — K219 Gastro-esophageal reflux disease without esophagitis: Secondary | ICD-10-CM

## 2019-09-03 DIAGNOSIS — Z794 Long term (current) use of insulin: Secondary | ICD-10-CM

## 2019-09-03 DIAGNOSIS — I10 Essential (primary) hypertension: Secondary | ICD-10-CM

## 2019-09-03 DIAGNOSIS — E782 Mixed hyperlipidemia: Secondary | ICD-10-CM | POA: Diagnosis not present

## 2019-09-03 DIAGNOSIS — E119 Type 2 diabetes mellitus without complications: Secondary | ICD-10-CM | POA: Diagnosis not present

## 2019-09-03 DIAGNOSIS — E1140 Type 2 diabetes mellitus with diabetic neuropathy, unspecified: Secondary | ICD-10-CM

## 2019-09-03 DIAGNOSIS — F411 Generalized anxiety disorder: Secondary | ICD-10-CM

## 2019-09-03 NOTE — Chronic Care Management (AMB) (Signed)
Chronic Care Management    Clinical Social Work Follow Up Note  09/03/2019 Name: Justin Ruiz MRN: [id number] DOB: 04-Jan-1951  Justin Ruiz is a 69 y.o. year old male who is a primary care patient of Stacks, [name], MD. The CCM team was consulted for assistance with [employer] .   Review of patient status, including review of consultants reports, other relevant assessments, and collaboration with appropriate care team members and the patient's provider was performed as part of comprehensive patient evaluation and provision of chronic care management services.    SDOH (Social Determinants of Health) assessments performed: Yes; risk for social isolation; risk for tobacco use; risk for stress; risk for physical inactivity    Chronic Care Management from 03/04/2019 in [place]  PHQ-9 Total Score  6     GAD 7 : Generalized Anxiety Score 03/04/2019  Nervous, Anxious, on Edge 1  Control/stop worrying 0  Worry too much - different things 0  Trouble relaxing 1  Restless 0  Easily annoyed or irritable 0  Afraid - awful might happen 1  Total GAD 7 Score 3  Anxiety Difficulty Somewhat difficult    Outpatient Encounter Medications as of 09/03/2019  Medication Sig Note  . acetaminophen (TYLENOL) 325 MG tablet Take 650 mg by mouth every 6 (six) hours as needed.   Marland Kitchen albuterol (VENTOLIN HFA) 108 (90 Base) MCG/ACT inhaler Inhale 2 puffs into the lungs every 4 (four) hours as needed for wheezing or shortness of breath. 08/12/2019: Uses 2x per day  . ALPRAZolam (XANAX) 0.25 MG tablet Take 0.25 mg by mouth 3 (three) times daily as needed.   . bisoprolol (ZEBETA) 5 MG tablet Take 1 tablet (5 mg total) by mouth 2 (two) times daily.   . budesonide (PULMICORT) 0.25 MG/2ML nebulizer solution Take 2 mLs (0.25 mg total) by nebulization 2 (two) times daily.   Marland Kitchen ELIQUIS 5 MG TABS tablet Take 1 tablet by mouth twice daily   . escitalopram (LEXAPRO) 20 MG tablet Take 1 tablet  (20 mg total) by mouth daily.   . furosemide (LASIX) 40 MG tablet Take 1 tablet (40 mg total) by mouth 2 (two) times daily.   . insulin NPH Human (NOVOLIN N) 100 UNIT/ML injection Inject 0.2 mLs (20 Units total) into the skin daily before supper AND 0.1 mLs (10 Units total) daily before breakfast.   . insulin regular (NOVOLIN R) 100 units/mL injection Inject 0.08 mLs (8 Units total) into the skin 3 (three) times daily before meals.   Marland Kitchen ipratropium-albuterol (DUONEB) 0.5-2.5 (3) MG/3ML SOLN Take 3 mLs by nebulization every 6 (six) hours as needed. 08/12/2019: Uses every 4-6 hours  . nystatin (MYCOSTATIN/NYSTOP) powder Apply topically 2 (two) times daily. Apply to groin area and scrotum skin; keep area clean and dry.   Marland Kitchen oxymetazoline (AFRIN) 0.05 % nasal spray Place 1 spray into both nostrils 2 (two) times daily as needed for congestion. 07/28/2019: On hand if needed  . predniSONE (DELTASONE) 10 MG tablet Take 1 tablet (10 mg total) by mouth daily with breakfast. 08/12/2019: Not working well 4/19  . Semaglutide, 1 MG/DOSE, (OZEMPIC, 1 MG/DOSE,) 2 MG/1.5ML SOPN Inject 1 mg into the skin once a week.   . simvastatin (ZOCOR) 40 MG tablet Take 1 tablet by mouth once daily   . Tiotropium Bromide-Olodaterol (STIOLTO RESPIMAT) 2.5-2.5 MCG/ACT AERS Inhale 2 puffs into the lungs daily. #28 metered inhalations LOT 11/21 [id number] B sample   . Vitamin D, Cholecalciferol, 50 MCG (2000  UT) CAPS Take 1 capsule by mouth daily.    No facility-administered encounter medications on file as of 09/03/2019.     Goals    . Client will talk with LCSW in next 30 days to discuss anxiety, stress issues of client (pt-stated)     Current Barriers:  . Anxiety issues in client with Chronic Diagnoses of GAD, COPD,DM Type 2, HLD, HTN, and GERD . Breathing issues . Irregular meal schedule  Clinical Social Work Clinical Goal(s):  . Client will talk with LCSW in next 30 days to discus anxiety and stress issues of  client  Interventions:   Encouraged Justin Ruiz and spouse to call RNCM to discuss nursing issues of client  Talked with Justin Ruiz about client panic episodes/anxiety symptoms  Talked with Justin Ruiz about mobility issues of client   Talked with Justin Ruiz about oxygen use of client  Talked with Justin Ruiz about client weakness, tiredness upon exersion  Talked with Justin Ruiz about client's upcoming appointments  Talked with Justin Ruiz about sleeping challenges of client . Talked with Justin Ruiz about relaxation techniques of client (client watches TV, talks via phone with family members) . Talked with Justin Ruiz about home health support for client. She said client has help with home health nurse and with home health physical therapist, home health occupational therapist. . Talked with Justin Ruiz about mood of client . Talked with Justin Ruiz about client completion of ADLs. . Talked with Justin Ruiz about her calling pharmacist Justin Ruiz at [facility] to discuss medication questions with [facility] pharmacist  Patient Self Care Activities:  . Attends scheduled medical appointments . Takes medications as prescribed  Plan:    Client to attend scheduled medical appointments LCSW to call client or spouse of client  in next 4 weeks to talk with client about his anxiety and stress symptoms management.  Client or spouse to talk with RNCM as needed to discuss nursing needs  Initial goal documentation         Follow Up Plan: LCSW to call client/spouse in next 4 weeks to talk with client/spouse about client's anxiety/stress symptoms management  Justin Ruiz MSW, LCSW Licensed Clinical Social Worker [facility] Care Management [phone]

## 2019-09-03 NOTE — Patient Instructions (Addendum)
Licensed Clinical Social Worker Visit Information  Goals we discussed today:  Goals    . Client will talk with LCSW in next 30 days to discuss anxiety, stress issues of client (pt-stated)     Current Barriers:  . Anxiety issues in client with Chronic Diagnoses of GAD, COPD,DM Type 2, HLD, HTN, and GERD . Breathing issues . Irregular meal schedule  Clinical Social Work Clinical Goal(s):  . Client will talk with LCSW in next 30 days to discus anxiety and stress issues of client  Interventions:  Encouraged [name] and spouse to call RNCM to discuss nursing issues of client  Talked with [name] about client panic episodes/anxiety symptoms  Talked with [name] about mobility issues of client  Talked with [name] about oxygen use of client Talked with [name] about client weakness, tiredness upon exersion Talked with [name] about client's upcoming appointments  Talked with [name] about sleeping challenges of client Talked with [name] about relaxation techniques of client (client watches TV, talks via phone with family members)  Talked with [name] about home health support for client. She said client has help with home health nurse and with home health physical therapist, home health occupational therapist.  Talked with [name] about mood of client  Talked with [name] about client completion of ADLs.  Talked with [name] about her calling pharmacist [name] at [facility] to discuss medication questions with [facility] pharmacist  Patient Self Care Activities:  . Attends scheduled medical appointments . Takes medications as prescribed  Plan:    Client to attend scheduled medical appointments LCSW to call client or spouse of client  in next 4 weeks to talk with client about his anxiety and stress symptoms management.  Client or spouse to talk with RNCM as needed to discuss nursing needs  Initial goal documentation      Materials Provided: No  Follow Up Plan: LCSW to call  client/spouse in next 4 weeks to talk with client/spouse about  client's anxiety and stress symptoms management  The patient/Justin Ruiz, spouse verbalized understanding of instructions provided today and declined a print copy of patient instruction materials.   Norva Riffle.Belina Mandile MSW, LCSW Licensed Clinical Social Worker [facility] Care Management [phone]

## 2019-09-04 ENCOUNTER — Other Ambulatory Visit: Payer: Self-pay | Admitting: Pharmacy Technician

## 2019-09-04 DIAGNOSIS — I872 Venous insufficiency (chronic) (peripheral): Secondary | ICD-10-CM | POA: Diagnosis not present

## 2019-09-04 DIAGNOSIS — M6281 Muscle weakness (generalized): Secondary | ICD-10-CM | POA: Diagnosis not present

## 2019-09-04 DIAGNOSIS — I509 Heart failure, unspecified: Secondary | ICD-10-CM | POA: Diagnosis not present

## 2019-09-04 DIAGNOSIS — J9611 Chronic respiratory failure with hypoxia: Secondary | ICD-10-CM | POA: Diagnosis not present

## 2019-09-04 NOTE — Patient Outreach (Signed)
[place] [facility] [facility]) Care Management  09/04/2019  Justin Ruiz [birth year] [id number]   Incoming call from patient's wife Justin Ruiz regarding patient assistance receipt of attestation form from [employer] for [employer], HIPAA identifiers verified.   Justin Ruiz does not remember if she sent back the attestation form. She is unable to locate it at her home. She remembers discussing it with me back in April. She is not sure if she misplaced it in her move or if she sent it back to [employer].  Will follow up with [employer] as previously scheduled. If [employer] does not have the attestation form then will inquire if they can send her a duplication attestation form. Patient was agreeable to this plan.  Justin Ruiz, Justin Ruiz  [phone]

## 2019-09-05 ENCOUNTER — Other Ambulatory Visit: Payer: Self-pay | Admitting: Family Medicine

## 2019-09-05 NOTE — Telephone Encounter (Signed)
  Prescription Request  09/05/2019  What is the name of the medication or equipment? Bisoprolol 5mg   Have you contacted your pharmacy to request a refill? (if applicable) Yes  Which pharmacy would you like this sent to? [facility]   Patient notified that their request is being sent to the clinical staff for review and that they should receive a response within 2 business days.   Stacks' pt  Hosp changed Metoprolol to Bisoprolol-twice a day-5 mg  Please call wife.

## 2019-09-06 DIAGNOSIS — M1990 Unspecified osteoarthritis, unspecified site: Secondary | ICD-10-CM | POA: Diagnosis not present

## 2019-09-06 DIAGNOSIS — G4733 Obstructive sleep apnea (adult) (pediatric): Secondary | ICD-10-CM | POA: Diagnosis not present

## 2019-09-06 DIAGNOSIS — E1151 Type 2 diabetes mellitus with diabetic peripheral angiopathy without gangrene: Secondary | ICD-10-CM | POA: Diagnosis not present

## 2019-09-06 DIAGNOSIS — Z7901 Long term (current) use of anticoagulants: Secondary | ICD-10-CM | POA: Diagnosis not present

## 2019-09-06 DIAGNOSIS — S91114D Laceration without foreign body of right lesser toe(s) without damage to nail, subsequent encounter: Secondary | ICD-10-CM | POA: Diagnosis not present

## 2019-09-06 DIAGNOSIS — C8590 Non-Hodgkin lymphoma, unspecified, unspecified site: Secondary | ICD-10-CM | POA: Diagnosis not present

## 2019-09-06 DIAGNOSIS — E1140 Type 2 diabetes mellitus with diabetic neuropathy, unspecified: Secondary | ICD-10-CM | POA: Diagnosis not present

## 2019-09-06 DIAGNOSIS — I482 Chronic atrial fibrillation, unspecified: Secondary | ICD-10-CM | POA: Diagnosis not present

## 2019-09-06 DIAGNOSIS — E785 Hyperlipidemia, unspecified: Secondary | ICD-10-CM | POA: Diagnosis not present

## 2019-09-06 DIAGNOSIS — Z9981 Dependence on supplemental oxygen: Secondary | ICD-10-CM | POA: Diagnosis not present

## 2019-09-06 DIAGNOSIS — I872 Venous insufficiency (chronic) (peripheral): Secondary | ICD-10-CM | POA: Diagnosis not present

## 2019-09-06 DIAGNOSIS — R911 Solitary pulmonary nodule: Secondary | ICD-10-CM | POA: Diagnosis not present

## 2019-09-06 DIAGNOSIS — D696 Thrombocytopenia, unspecified: Secondary | ICD-10-CM | POA: Diagnosis not present

## 2019-09-06 DIAGNOSIS — D472 Monoclonal gammopathy: Secondary | ICD-10-CM | POA: Diagnosis not present

## 2019-09-06 DIAGNOSIS — I110 Hypertensive heart disease with heart failure: Secondary | ICD-10-CM | POA: Diagnosis not present

## 2019-09-06 DIAGNOSIS — I5033 Acute on chronic diastolic (congestive) heart failure: Secondary | ICD-10-CM | POA: Diagnosis not present

## 2019-09-06 DIAGNOSIS — K219 Gastro-esophageal reflux disease without esophagitis: Secondary | ICD-10-CM | POA: Diagnosis not present

## 2019-09-06 DIAGNOSIS — J9621 Acute and chronic respiratory failure with hypoxia: Secondary | ICD-10-CM | POA: Diagnosis not present

## 2019-09-06 DIAGNOSIS — J449 Chronic obstructive pulmonary disease, unspecified: Secondary | ICD-10-CM | POA: Diagnosis not present

## 2019-09-06 DIAGNOSIS — Z794 Long term (current) use of insulin: Secondary | ICD-10-CM | POA: Diagnosis not present

## 2019-09-06 DIAGNOSIS — E11319 Type 2 diabetes mellitus with unspecified diabetic retinopathy without macular edema: Secondary | ICD-10-CM | POA: Diagnosis not present

## 2019-09-09 DIAGNOSIS — J449 Chronic obstructive pulmonary disease, unspecified: Secondary | ICD-10-CM | POA: Diagnosis not present

## 2019-09-09 MED ORDER — BISOPROLOL FUMARATE 5 MG PO TABS: 5.0000 mg | ORAL_TABLET | Freq: Two times a day (BID) | ORAL | 3 refills | Status: DC

## 2019-09-10 DIAGNOSIS — I872 Venous insufficiency (chronic) (peripheral): Secondary | ICD-10-CM | POA: Diagnosis not present

## 2019-09-10 DIAGNOSIS — D696 Thrombocytopenia, unspecified: Secondary | ICD-10-CM | POA: Diagnosis not present

## 2019-09-10 DIAGNOSIS — E11319 Type 2 diabetes mellitus with unspecified diabetic retinopathy without macular edema: Secondary | ICD-10-CM | POA: Diagnosis not present

## 2019-09-10 DIAGNOSIS — Z7901 Long term (current) use of anticoagulants: Secondary | ICD-10-CM | POA: Diagnosis not present

## 2019-09-10 DIAGNOSIS — E1151 Type 2 diabetes mellitus with diabetic peripheral angiopathy without gangrene: Secondary | ICD-10-CM | POA: Diagnosis not present

## 2019-09-10 DIAGNOSIS — I482 Chronic atrial fibrillation, unspecified: Secondary | ICD-10-CM | POA: Diagnosis not present

## 2019-09-10 DIAGNOSIS — I110 Hypertensive heart disease with heart failure: Secondary | ICD-10-CM | POA: Diagnosis not present

## 2019-09-10 DIAGNOSIS — S91114D Laceration without foreign body of right lesser toe(s) without damage to nail, subsequent encounter: Secondary | ICD-10-CM | POA: Diagnosis not present

## 2019-09-10 DIAGNOSIS — C8590 Non-Hodgkin lymphoma, unspecified, unspecified site: Secondary | ICD-10-CM | POA: Diagnosis not present

## 2019-09-10 DIAGNOSIS — J9621 Acute and chronic respiratory failure with hypoxia: Secondary | ICD-10-CM | POA: Diagnosis not present

## 2019-09-10 DIAGNOSIS — E785 Hyperlipidemia, unspecified: Secondary | ICD-10-CM | POA: Diagnosis not present

## 2019-09-10 DIAGNOSIS — G4733 Obstructive sleep apnea (adult) (pediatric): Secondary | ICD-10-CM | POA: Diagnosis not present

## 2019-09-10 DIAGNOSIS — Z9981 Dependence on supplemental oxygen: Secondary | ICD-10-CM | POA: Diagnosis not present

## 2019-09-10 DIAGNOSIS — M1990 Unspecified osteoarthritis, unspecified site: Secondary | ICD-10-CM | POA: Diagnosis not present

## 2019-09-10 DIAGNOSIS — R911 Solitary pulmonary nodule: Secondary | ICD-10-CM | POA: Diagnosis not present

## 2019-09-10 DIAGNOSIS — K219 Gastro-esophageal reflux disease without esophagitis: Secondary | ICD-10-CM | POA: Diagnosis not present

## 2019-09-10 DIAGNOSIS — J449 Chronic obstructive pulmonary disease, unspecified: Secondary | ICD-10-CM | POA: Diagnosis not present

## 2019-09-10 DIAGNOSIS — E1140 Type 2 diabetes mellitus with diabetic neuropathy, unspecified: Secondary | ICD-10-CM | POA: Diagnosis not present

## 2019-09-10 DIAGNOSIS — Z794 Long term (current) use of insulin: Secondary | ICD-10-CM | POA: Diagnosis not present

## 2019-09-10 DIAGNOSIS — D472 Monoclonal gammopathy: Secondary | ICD-10-CM | POA: Diagnosis not present

## 2019-09-10 DIAGNOSIS — I5033 Acute on chronic diastolic (congestive) heart failure: Secondary | ICD-10-CM | POA: Diagnosis not present

## 2019-09-11 ENCOUNTER — Encounter: Payer: Self-pay | Admitting: Family Medicine

## 2019-09-11 ENCOUNTER — Ambulatory Visit (INDEPENDENT_AMBULATORY_CARE_PROVIDER_SITE_OTHER): Payer: Medicare Other | Admitting: Family Medicine

## 2019-09-11 VITALS — BP 126/74 | HR 97 | Temp 97.9°F

## 2019-09-11 DIAGNOSIS — E11319 Type 2 diabetes mellitus with unspecified diabetic retinopathy without macular edema: Secondary | ICD-10-CM

## 2019-09-11 DIAGNOSIS — Z794 Long term (current) use of insulin: Secondary | ICD-10-CM

## 2019-09-11 DIAGNOSIS — R040 Epistaxis: Secondary | ICD-10-CM | POA: Diagnosis not present

## 2019-09-11 DIAGNOSIS — E1165 Type 2 diabetes mellitus with hyperglycemia: Secondary | ICD-10-CM | POA: Diagnosis not present

## 2019-09-11 DIAGNOSIS — J449 Chronic obstructive pulmonary disease, unspecified: Secondary | ICD-10-CM | POA: Diagnosis not present

## 2019-09-11 MED ORDER — INSULIN NPH (HUMAN) (ISOPHANE) 100 UNIT/ML ~~LOC~~ SUSP: 24.0000 [IU] | Freq: Two times a day (BID) | SUBCUTANEOUS | 11 refills | Status: DC

## 2019-09-11 NOTE — Progress Notes (Signed)
Subjective:  Patient ID: Justin Ruiz, male    DOB: April 07, 1951  Age: 69 y.o. MRN: [id number]  CC: Epistaxis and Diabetes   HPI Justin Ruiz presents for glucose logs forgottten, but running 2-300. No lows.  Patient also had a nosebleed this afternoon.  He still has some residual blood noted but he says it is about stopped.  His wife is with him and helped him to slowly with applying pressure to the nose.  Depression screen [facility] 2/9 08/27/2019 08/21/2019 08/14/2019  Decreased Interest 0 0 0  Down, Depressed, Hopeless 0 0 0  PHQ - 2 Score 0 0 0  Altered sleeping - - -  Tired, decreased energy - - -  Change in appetite - - -  Feeling bad or failure about yourself  - - -  Trouble concentrating - - -  Moving slowly or fidgety/restless - - -  Suicidal thoughts - - -  PHQ-9 Score - - -  Difficult doing work/chores - - -  Some recent data might be hidden    History Justin Ruiz has a past medical history of Acute diastolic heart failure ([place]) (03/28/2016), Acute encephalopathy (03/28/2016), Acute encephalopathy (03/28/2016), Acute on chronic respiratory failure with hypoxia ([place]) (12/04/2016), Arthritis, Asthma, Atrial fibrillation ([place]), Benign neoplasm of ascending colon (05/05/2014), BPH (benign prostatic hypertrophy), CARDIOVASCULAR STUDIES, ABNORMAL (04/27/2009), Chronic anticoagulation (08/20/2012), Chronic respiratory failure with hypoxia ([place]) (11/10/2017), Colon polyps, COPD mixed type ([place]) (03/28/2016), Diabetes mellitus, Diabetic neuropathy, type II diabetes mellitus ([place]) (09/25/2014), Diabetic retinopathy ([place]) (05/28/2017), 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   He has a past surgical history that includes Appendectomy and Colonoscopy with propofol (N/A, 05/05/2014).   His family history includes Aortic aneurysm in his sister; COPD in his sister and sister; Colon cancer in his father; Colon polyps in his father; Diabetes in his maternal grandmother, mother, and sister; Heart attack in his sister; Heart disease in his mother, sister, and sister; Liver cancer in his mother; Prostate cancer in his father; Pulmonary embolism in his sister.He reports that he quit smoking about 10 months ago. His smoking use included cigarettes. He started smoking about 50 years ago. He has a 40.00 pack-year smoking history. He has never used smokeless tobacco. He reports that he does not drink alcohol or use drugs.    ROS Review of Systems  Constitutional: Negative for fever.  Respiratory: Negative for shortness of breath.   Cardiovascular: Negative for chest pain.  Musculoskeletal: Negative for arthralgias.  Skin: Negative for rash.    Objective:  BP 126/74   Pulse 97   Temp 97.9 F (36.6 C) (Temporal)   SpO2 94%   BP Readings from Last 3 Encounters:  09/11/19 126/74  08/27/19 138/80  08/21/19 131/89    Wt Readings from Last 3 Encounters:  08/27/19 285 lb 3.2 oz (129.4 kg)  08/21/19 283 lb 8 oz (128.6 kg)  08/14/19 282 lb 6.4 oz (128.1 kg)     Physical Exam Vitals reviewed.  Constitutional:      Appearance: He is well-developed.  HENT:     Head: Normocephalic and atraumatic.  Right Ear: External ear normal.     Left Ear: External ear normal.     Nose:     Comments: There is some fresh blood at the nares bilaterally.  This is minimal and no source can be identified.  This was probed with Q-tip for finding the source  and general friability was noted.  Patient is using oxygen and there was no noted water bubbler     Mouth/Throat:     Pharynx: No oropharyngeal exudate or posterior oropharyngeal erythema.  Eyes:     Pupils: Pupils are equal, round, and reactive to light.  Cardiovascular:     Rate and Rhythm: Normal rate and regular rhythm.     Heart sounds: No murmur.  Pulmonary:     Effort: No respiratory distress.     Breath sounds: Normal breath sounds.  Musculoskeletal:     Cervical back: Normal range of motion and neck supple.  Neurological:     Mental Status: He is alert and oriented to person, place, and time.       Assessment & Plan:   Justin Ruiz was seen today for epistaxis and diabetes.  Diagnoses and all orders for this visit:  COPD GOLD III  Type 2 diabetes mellitus with hyperglycemia, with long-term current use of insulin (HCC)  Anterior epistaxis  Diabetic retinopathy of both eyes associated with type 2 diabetes mellitus, macular edema presence unspecified, unspecified retinopathy severity (HCC) -     insulin NPH Human (NOVOLIN N) 100 UNIT/ML injection; Inject 0.24 mLs (24 Units total) into the skin 2 (two) times daily before a meal.       I have changed Justin Ruiz's insulin NPH Human. I am also having him maintain his ipratropium-albuterol, Eliquis, simvastatin, Ozempic (1 MG/DOSE), acetaminophen, nystatin, oxymetazoline, insulin regular, albuterol, predniSONE, escitalopram, furosemide, Vitamin D (Cholecalciferol), ALPRAZolam, budesonide, Stiolto Respimat, and bisoprolol.  Allergies as of 09/11/2019      Reactions   Avapro [irbesartan] Other (See Comments)   "doesnt sit right with me"--light headed   Lipitor [atorvastatin Calcium] Other (See Comments)   Leg pain      Medication List       Accurate as of Sep 11, 2019 10:03 PM. If you have any questions, ask your nurse or doctor.        acetaminophen 325 MG tablet Commonly known as: TYLENOL Take 650 mg by  mouth every 6 (six) hours as needed.   albuterol 108 (90 Base) MCG/ACT inhaler Commonly known as: VENTOLIN HFA Inhale 2 puffs into the lungs every 4 (four) hours as needed for wheezing or shortness of breath.   ALPRAZolam 0.25 MG tablet Commonly known as: XANAX Take 0.25 mg by mouth 3 (three) times daily as needed.   bisoprolol 5 MG tablet Commonly known as: ZEBETA Take 1 tablet (5 mg total) by mouth 2 (two) times daily.   budesonide 0.25 MG/2ML nebulizer solution Commonly known as: PULMICORT Take 2 mLs (0.25 mg total) by nebulization 2 (two) times daily.   Eliquis 5 MG Tabs tablet Generic drug: apixaban Take 1 tablet by mouth twice daily   escitalopram 20 MG tablet Commonly known as: LEXAPRO Take 1 tablet (20 mg total) by mouth daily.   furosemide 40 MG tablet Commonly known as: LASIX Take 1 tablet (40 mg total) by mouth 2 (two) times daily.   insulin NPH Human 100 UNIT/ML injection Commonly known as: NovoLIN N Inject 0.24 mLs (24 Units total) into the skin 2 (two) times daily before a meal.  What changed: See the new instructions. Changed by: [name], MD   insulin regular 100 units/mL injection Commonly known as: NOVOLIN R Inject 0.08 mLs (8 Units total) into the skin 3 (three) times daily before meals.   ipratropium-albuterol 0.5-2.5 (3) MG/3ML Soln Commonly known as: DUONEB Take 3 mLs by nebulization every 6 (six) hours as needed.   nystatin powder Commonly known as: MYCOSTATIN/NYSTOP Apply topically 2 (two) times daily. Apply to groin area and scrotum skin; keep area clean and dry.   oxymetazoline 0.05 % nasal spray Commonly known as: AFRIN Place 1 spray into both nostrils 2 (two) times daily as needed for congestion.   Ozempic (1 MG/DOSE) 2 MG/1.5ML Sopn Generic drug: Semaglutide (1 MG/DOSE) Inject 1 mg into the skin once a week.   predniSONE 10 MG tablet Commonly known as: DELTASONE Take 1 tablet (10 mg total) by mouth daily with breakfast.     simvastatin 40 MG tablet Commonly known as: ZOCOR Take 1 tablet by mouth once daily   Stiolto Respimat 2.5-2.5 MCG/ACT Aers Generic drug: Tiotropium Bromide-Olodaterol Inhale 2 puffs into the lungs daily. #28 metered inhalations LOT 11/21 [id number] B sample   Vitamin D (Cholecalciferol) 50 MCG (2000 UT) Caps Take 1 capsule by mouth daily.      He should use a water bubbling chamber on his oxygen to help moisturize the oxygen he reexamined.  He can use nasal saline drops frequently through the day.  He should use ice to help coagulate blood that is currently clear.  He should get in touch with the office if bleeding recurs and cannot be stopped with simple measures such as direct pressure.  Insulin was changed as noted above.  24 of NPH that is NovoLog and before breakfast and supper and 8 Novolin are before each meal.  Follow-up: Return in about 2 weeks (around 09/25/2019).  [name], M.D.

## 2019-09-12 ENCOUNTER — Other Ambulatory Visit: Payer: Self-pay | Admitting: Internal Medicine

## 2019-09-12 DIAGNOSIS — D472 Monoclonal gammopathy: Secondary | ICD-10-CM | POA: Diagnosis not present

## 2019-09-12 DIAGNOSIS — I110 Hypertensive heart disease with heart failure: Secondary | ICD-10-CM | POA: Diagnosis not present

## 2019-09-12 DIAGNOSIS — C8590 Non-Hodgkin lymphoma, unspecified, unspecified site: Secondary | ICD-10-CM | POA: Diagnosis not present

## 2019-09-12 DIAGNOSIS — Z794 Long term (current) use of insulin: Secondary | ICD-10-CM | POA: Diagnosis not present

## 2019-09-12 DIAGNOSIS — E1140 Type 2 diabetes mellitus with diabetic neuropathy, unspecified: Secondary | ICD-10-CM | POA: Diagnosis not present

## 2019-09-12 DIAGNOSIS — D696 Thrombocytopenia, unspecified: Secondary | ICD-10-CM | POA: Diagnosis not present

## 2019-09-12 DIAGNOSIS — Z9981 Dependence on supplemental oxygen: Secondary | ICD-10-CM | POA: Diagnosis not present

## 2019-09-12 DIAGNOSIS — G4733 Obstructive sleep apnea (adult) (pediatric): Secondary | ICD-10-CM | POA: Diagnosis not present

## 2019-09-12 DIAGNOSIS — K219 Gastro-esophageal reflux disease without esophagitis: Secondary | ICD-10-CM | POA: Diagnosis not present

## 2019-09-12 DIAGNOSIS — J9621 Acute and chronic respiratory failure with hypoxia: Secondary | ICD-10-CM | POA: Diagnosis not present

## 2019-09-12 DIAGNOSIS — R911 Solitary pulmonary nodule: Secondary | ICD-10-CM | POA: Diagnosis not present

## 2019-09-12 DIAGNOSIS — I482 Chronic atrial fibrillation, unspecified: Secondary | ICD-10-CM | POA: Diagnosis not present

## 2019-09-12 DIAGNOSIS — M1990 Unspecified osteoarthritis, unspecified site: Secondary | ICD-10-CM | POA: Diagnosis not present

## 2019-09-12 DIAGNOSIS — E1151 Type 2 diabetes mellitus with diabetic peripheral angiopathy without gangrene: Secondary | ICD-10-CM | POA: Diagnosis not present

## 2019-09-12 DIAGNOSIS — I872 Venous insufficiency (chronic) (peripheral): Secondary | ICD-10-CM | POA: Diagnosis not present

## 2019-09-12 DIAGNOSIS — E785 Hyperlipidemia, unspecified: Secondary | ICD-10-CM | POA: Diagnosis not present

## 2019-09-12 DIAGNOSIS — E11319 Type 2 diabetes mellitus with unspecified diabetic retinopathy without macular edema: Secondary | ICD-10-CM | POA: Diagnosis not present

## 2019-09-12 DIAGNOSIS — J449 Chronic obstructive pulmonary disease, unspecified: Secondary | ICD-10-CM | POA: Diagnosis not present

## 2019-09-12 DIAGNOSIS — Z7901 Long term (current) use of anticoagulants: Secondary | ICD-10-CM | POA: Diagnosis not present

## 2019-09-12 DIAGNOSIS — I5033 Acute on chronic diastolic (congestive) heart failure: Secondary | ICD-10-CM | POA: Diagnosis not present

## 2019-09-12 DIAGNOSIS — S91114D Laceration without foreign body of right lesser toe(s) without damage to nail, subsequent encounter: Secondary | ICD-10-CM | POA: Diagnosis not present

## 2019-09-13 ENCOUNTER — Other Ambulatory Visit: Payer: Self-pay | Admitting: Pharmacy Technician

## 2019-09-13 NOTE — Patient Outreach (Signed)
[place] [facility]) Care Management  09/13/2019  [name] [birth year] [id number]  ADDENDUM  Successful outreach call placed to patient's wife in regards to [employer] application for Proventil HFA.  HIPAA identifiers verified and confirmed with wife [name].  Informed [name] that [employer] was mailing out a duplicate attestation form and that she should have it by next week. Informed her to outreach me when she has received it and we could discuss it together. [name] was agreeable to this plan.  Will follow up with patient or [employer] in 7-10 business days if call is not returned.  [name], [name]  [phone]

## 2019-09-13 NOTE — Patient Outreach (Signed)
[place] [facility]) Care Management  09/13/2019  Justin Ruiz [birth year] [id number]  Care coordination call placed to [employer] in regards to Proventil application.  Spoke to [place] who informed they have not received back the application. Informed [name] that patient has not received the attestation form and inquired if another one could be mailed to him. [name] spoke to her supervisor and was able to have a duplicate attestation form mailed to patient. She informed that would be mailed either today or Monday and patient should receive by the end of next week.  Will outreach patient with this information.  [name], [place]  [phone]

## 2019-09-17 DIAGNOSIS — C8590 Non-Hodgkin lymphoma, unspecified, unspecified site: Secondary | ICD-10-CM | POA: Diagnosis not present

## 2019-09-17 DIAGNOSIS — E1140 Type 2 diabetes mellitus with diabetic neuropathy, unspecified: Secondary | ICD-10-CM | POA: Diagnosis not present

## 2019-09-17 DIAGNOSIS — E1151 Type 2 diabetes mellitus with diabetic peripheral angiopathy without gangrene: Secondary | ICD-10-CM | POA: Diagnosis not present

## 2019-09-17 DIAGNOSIS — I5033 Acute on chronic diastolic (congestive) heart failure: Secondary | ICD-10-CM | POA: Diagnosis not present

## 2019-09-17 DIAGNOSIS — Z794 Long term (current) use of insulin: Secondary | ICD-10-CM | POA: Diagnosis not present

## 2019-09-17 DIAGNOSIS — S91114D Laceration without foreign body of right lesser toe(s) without damage to nail, subsequent encounter: Secondary | ICD-10-CM | POA: Diagnosis not present

## 2019-09-17 DIAGNOSIS — I872 Venous insufficiency (chronic) (peripheral): Secondary | ICD-10-CM | POA: Diagnosis not present

## 2019-09-17 DIAGNOSIS — I482 Chronic atrial fibrillation, unspecified: Secondary | ICD-10-CM | POA: Diagnosis not present

## 2019-09-17 DIAGNOSIS — E785 Hyperlipidemia, unspecified: Secondary | ICD-10-CM | POA: Diagnosis not present

## 2019-09-17 DIAGNOSIS — K219 Gastro-esophageal reflux disease without esophagitis: Secondary | ICD-10-CM | POA: Diagnosis not present

## 2019-09-17 DIAGNOSIS — G4733 Obstructive sleep apnea (adult) (pediatric): Secondary | ICD-10-CM | POA: Diagnosis not present

## 2019-09-17 DIAGNOSIS — Z9981 Dependence on supplemental oxygen: Secondary | ICD-10-CM | POA: Diagnosis not present

## 2019-09-17 DIAGNOSIS — E11319 Type 2 diabetes mellitus with unspecified diabetic retinopathy without macular edema: Secondary | ICD-10-CM | POA: Diagnosis not present

## 2019-09-17 DIAGNOSIS — Z7901 Long term (current) use of anticoagulants: Secondary | ICD-10-CM | POA: Diagnosis not present

## 2019-09-17 DIAGNOSIS — D472 Monoclonal gammopathy: Secondary | ICD-10-CM | POA: Diagnosis not present

## 2019-09-17 DIAGNOSIS — M1990 Unspecified osteoarthritis, unspecified site: Secondary | ICD-10-CM | POA: Diagnosis not present

## 2019-09-17 DIAGNOSIS — J9621 Acute and chronic respiratory failure with hypoxia: Secondary | ICD-10-CM | POA: Diagnosis not present

## 2019-09-17 DIAGNOSIS — I110 Hypertensive heart disease with heart failure: Secondary | ICD-10-CM | POA: Diagnosis not present

## 2019-09-17 DIAGNOSIS — R911 Solitary pulmonary nodule: Secondary | ICD-10-CM | POA: Diagnosis not present

## 2019-09-17 DIAGNOSIS — J449 Chronic obstructive pulmonary disease, unspecified: Secondary | ICD-10-CM | POA: Diagnosis not present

## 2019-09-17 DIAGNOSIS — D696 Thrombocytopenia, unspecified: Secondary | ICD-10-CM | POA: Diagnosis not present

## 2019-09-20 ENCOUNTER — Telehealth: Payer: Self-pay | Admitting: Internal Medicine

## 2019-09-20 DIAGNOSIS — Z794 Long term (current) use of insulin: Secondary | ICD-10-CM | POA: Diagnosis not present

## 2019-09-20 DIAGNOSIS — G4733 Obstructive sleep apnea (adult) (pediatric): Secondary | ICD-10-CM | POA: Diagnosis not present

## 2019-09-20 DIAGNOSIS — D472 Monoclonal gammopathy: Secondary | ICD-10-CM | POA: Diagnosis not present

## 2019-09-20 DIAGNOSIS — D696 Thrombocytopenia, unspecified: Secondary | ICD-10-CM | POA: Diagnosis not present

## 2019-09-20 DIAGNOSIS — I872 Venous insufficiency (chronic) (peripheral): Secondary | ICD-10-CM | POA: Diagnosis not present

## 2019-09-20 DIAGNOSIS — R911 Solitary pulmonary nodule: Secondary | ICD-10-CM | POA: Diagnosis not present

## 2019-09-20 DIAGNOSIS — E11319 Type 2 diabetes mellitus with unspecified diabetic retinopathy without macular edema: Secondary | ICD-10-CM | POA: Diagnosis not present

## 2019-09-20 DIAGNOSIS — K219 Gastro-esophageal reflux disease without esophagitis: Secondary | ICD-10-CM | POA: Diagnosis not present

## 2019-09-20 DIAGNOSIS — I110 Hypertensive heart disease with heart failure: Secondary | ICD-10-CM | POA: Diagnosis not present

## 2019-09-20 DIAGNOSIS — I482 Chronic atrial fibrillation, unspecified: Secondary | ICD-10-CM | POA: Diagnosis not present

## 2019-09-20 DIAGNOSIS — E785 Hyperlipidemia, unspecified: Secondary | ICD-10-CM | POA: Diagnosis not present

## 2019-09-20 DIAGNOSIS — S91114D Laceration without foreign body of right lesser toe(s) without damage to nail, subsequent encounter: Secondary | ICD-10-CM | POA: Diagnosis not present

## 2019-09-20 DIAGNOSIS — E1140 Type 2 diabetes mellitus with diabetic neuropathy, unspecified: Secondary | ICD-10-CM | POA: Diagnosis not present

## 2019-09-20 DIAGNOSIS — I5033 Acute on chronic diastolic (congestive) heart failure: Secondary | ICD-10-CM | POA: Diagnosis not present

## 2019-09-20 DIAGNOSIS — Z9981 Dependence on supplemental oxygen: Secondary | ICD-10-CM | POA: Diagnosis not present

## 2019-09-20 DIAGNOSIS — E1151 Type 2 diabetes mellitus with diabetic peripheral angiopathy without gangrene: Secondary | ICD-10-CM | POA: Diagnosis not present

## 2019-09-20 DIAGNOSIS — C8590 Non-Hodgkin lymphoma, unspecified, unspecified site: Secondary | ICD-10-CM | POA: Diagnosis not present

## 2019-09-20 DIAGNOSIS — M1990 Unspecified osteoarthritis, unspecified site: Secondary | ICD-10-CM | POA: Diagnosis not present

## 2019-09-20 DIAGNOSIS — J9621 Acute and chronic respiratory failure with hypoxia: Secondary | ICD-10-CM | POA: Diagnosis not present

## 2019-09-20 DIAGNOSIS — J449 Chronic obstructive pulmonary disease, unspecified: Secondary | ICD-10-CM | POA: Diagnosis not present

## 2019-09-20 DIAGNOSIS — Z7901 Long term (current) use of anticoagulants: Secondary | ICD-10-CM | POA: Diagnosis not present

## 2019-09-20 MED ORDER — IPRATROPIUM-ALBUTEROL 0.5-2.5 (3) MG/3ML IN SOLN: RESPIRATORY_TRACT | 1 refills | Status: DC

## 2019-09-20 NOTE — Telephone Encounter (Signed)
Called and spoke with Patient's Wife, [name]. [name] stated Patient needed a refill for Duo neb sent to [facility]. Refill request sent to requested pharmacy.  Nothing further at this time.

## 2019-09-23 ENCOUNTER — Other Ambulatory Visit: Payer: Self-pay | Admitting: Family Medicine

## 2019-09-24 DIAGNOSIS — S91114D Laceration without foreign body of right lesser toe(s) without damage to nail, subsequent encounter: Secondary | ICD-10-CM | POA: Diagnosis not present

## 2019-09-24 DIAGNOSIS — J449 Chronic obstructive pulmonary disease, unspecified: Secondary | ICD-10-CM | POA: Diagnosis not present

## 2019-09-24 DIAGNOSIS — Z794 Long term (current) use of insulin: Secondary | ICD-10-CM | POA: Diagnosis not present

## 2019-09-24 DIAGNOSIS — R911 Solitary pulmonary nodule: Secondary | ICD-10-CM | POA: Diagnosis not present

## 2019-09-24 DIAGNOSIS — G4733 Obstructive sleep apnea (adult) (pediatric): Secondary | ICD-10-CM | POA: Diagnosis not present

## 2019-09-24 DIAGNOSIS — I5033 Acute on chronic diastolic (congestive) heart failure: Secondary | ICD-10-CM | POA: Diagnosis not present

## 2019-09-24 DIAGNOSIS — E1140 Type 2 diabetes mellitus with diabetic neuropathy, unspecified: Secondary | ICD-10-CM | POA: Diagnosis not present

## 2019-09-24 DIAGNOSIS — K219 Gastro-esophageal reflux disease without esophagitis: Secondary | ICD-10-CM | POA: Diagnosis not present

## 2019-09-24 DIAGNOSIS — Z7901 Long term (current) use of anticoagulants: Secondary | ICD-10-CM | POA: Diagnosis not present

## 2019-09-24 DIAGNOSIS — E785 Hyperlipidemia, unspecified: Secondary | ICD-10-CM | POA: Diagnosis not present

## 2019-09-24 DIAGNOSIS — E1151 Type 2 diabetes mellitus with diabetic peripheral angiopathy without gangrene: Secondary | ICD-10-CM | POA: Diagnosis not present

## 2019-09-24 DIAGNOSIS — E11319 Type 2 diabetes mellitus with unspecified diabetic retinopathy without macular edema: Secondary | ICD-10-CM | POA: Diagnosis not present

## 2019-09-24 DIAGNOSIS — D472 Monoclonal gammopathy: Secondary | ICD-10-CM | POA: Diagnosis not present

## 2019-09-24 DIAGNOSIS — J9621 Acute and chronic respiratory failure with hypoxia: Secondary | ICD-10-CM | POA: Diagnosis not present

## 2019-09-24 DIAGNOSIS — D696 Thrombocytopenia, unspecified: Secondary | ICD-10-CM | POA: Diagnosis not present

## 2019-09-24 DIAGNOSIS — I482 Chronic atrial fibrillation, unspecified: Secondary | ICD-10-CM | POA: Diagnosis not present

## 2019-09-24 DIAGNOSIS — Z9981 Dependence on supplemental oxygen: Secondary | ICD-10-CM | POA: Diagnosis not present

## 2019-09-24 DIAGNOSIS — M1990 Unspecified osteoarthritis, unspecified site: Secondary | ICD-10-CM | POA: Diagnosis not present

## 2019-09-24 DIAGNOSIS — I110 Hypertensive heart disease with heart failure: Secondary | ICD-10-CM | POA: Diagnosis not present

## 2019-09-24 DIAGNOSIS — I872 Venous insufficiency (chronic) (peripheral): Secondary | ICD-10-CM | POA: Diagnosis not present

## 2019-09-24 DIAGNOSIS — C8590 Non-Hodgkin lymphoma, unspecified, unspecified site: Secondary | ICD-10-CM | POA: Diagnosis not present

## 2019-09-25 ENCOUNTER — Ambulatory Visit (INDEPENDENT_AMBULATORY_CARE_PROVIDER_SITE_OTHER): Payer: Medicare Other | Admitting: Family Medicine

## 2019-09-25 ENCOUNTER — Other Ambulatory Visit: Payer: Self-pay

## 2019-09-25 ENCOUNTER — Other Ambulatory Visit: Payer: Self-pay | Admitting: Family Medicine

## 2019-09-25 ENCOUNTER — Other Ambulatory Visit: Payer: Self-pay | Admitting: Pharmacy Technician

## 2019-09-25 ENCOUNTER — Telehealth: Payer: Self-pay | Admitting: *Deleted

## 2019-09-25 ENCOUNTER — Encounter: Payer: Self-pay | Admitting: Family Medicine

## 2019-09-25 VITALS — BP 137/74 | HR 95 | Temp 98.2°F | Resp 22 | Ht 69.0 in | Wt 286.2 lb

## 2019-09-25 DIAGNOSIS — E119 Type 2 diabetes mellitus without complications: Secondary | ICD-10-CM

## 2019-09-25 DIAGNOSIS — E11319 Type 2 diabetes mellitus with unspecified diabetic retinopathy without macular edema: Secondary | ICD-10-CM | POA: Diagnosis not present

## 2019-09-25 DIAGNOSIS — E1165 Type 2 diabetes mellitus with hyperglycemia: Secondary | ICD-10-CM | POA: Diagnosis not present

## 2019-09-25 DIAGNOSIS — Z794 Long term (current) use of insulin: Secondary | ICD-10-CM

## 2019-09-25 LAB — GLUCOSE HEMOCUE WAIVED: Glu Hemocue Waived: 358 mg/dL — ABNORMAL HIGH (ref 65–99)

## 2019-09-25 MED ORDER — TRULICITY 4.5 MG/0.5ML ~~LOC~~ SOAJ
4.5000 mg | SUBCUTANEOUS | 0 refills | Status: DC
Start: 2019-09-25 — End: 2019-12-18

## 2019-09-25 MED ORDER — INSULIN NPH (HUMAN) (ISOPHANE) 100 UNIT/ML ~~LOC~~ SUSP: 28.0000 [IU] | Freq: Two times a day (BID) | SUBCUTANEOUS | 11 refills | Status: DC

## 2019-09-25 NOTE — Telephone Encounter (Signed)
Patients wife notified and agrees with decision to wait.

## 2019-09-25 NOTE — Telephone Encounter (Signed)
Patient's wife [name]) states that patient would like to attempt to get License again but wants to have PCPs opinion on whether or no he should be driving.

## 2019-09-25 NOTE — Patient Outreach (Signed)
[place] [facility]) Care Management  09/25/2019  Justin Ruiz 08-14-1950 [id number]  Successful call placed to patient regarding patient assistance receipt of attestation form from [place] for Proventil HFA, HIPAA identifiers verified.   Spoke to patient's wife [name], HIPAA confirmed and verified. [name] was able to confirm and complete the duplicate attestation form that [employer] mailed to patient. The original application was not included as this was duplicate mailing. [name] informed she would place in the mail for the patient on 09/26/2019.  Follow up:  Will follow up with [employer] in 10-14 business days to inquire if a determination has been made.  [name] P. [name], [place]  [phone]

## 2019-09-25 NOTE — Progress Notes (Addendum)
Subjective:  Patient ID: Justin Ruiz, male    DOB: 25-Sep-1950  Age: 69 y.o. MRN: [id number]  CC: Follow-up (2 week, Blood sugar)   HPI Justin Ruiz presents for follow-up on his poor control diabetes.  He brings in blood sugar readings from breakfast lunch and supper.  These are dated from May 5 to the present.  They run at breakfast time from a low of 215 to a high of 461.  The trend seems to be between 250 and 350.  At lunch the trend seems to be about the same.  One reading as high as 513 was noted 3 others over 400.  At dinner the trend seems to be most being over 300 with the last few days that is, about a week they have been and 2 30-300 range.  Patient is testing blood sugar 4-6 times daily.  He is injecting insulin 3-4 times daily depending on blood sugar readings.  He is making adjustments to insulin based on readings.  Patient is experienced no low sugars.  He denies any side effects of medications.  At this time however his COPD seems to be under coming under somewhat better control.  He is wearing his oxygen today and wheelchair-bound.  Depression screen [facility] 2/9 09/25/2019 08/27/2019 08/21/2019  Decreased Interest 0 0 0  Down, Depressed, Hopeless 0 0 0  PHQ - 2 Score 0 0 0  Altered sleeping - - -  Tired, decreased energy - - -  Change in appetite - - -  Feeling bad or failure about yourself  - - -  Trouble concentrating - - -  Moving slowly or fidgety/restless - - -  Suicidal thoughts - - -  PHQ-9 Score - - -  Difficult doing work/chores - - -  Some recent data might be hidden    History Justin Ruiz has a past medical history of Acute diastolic heart failure ([place]) (03/28/2016), Acute encephalopathy (03/28/2016), Acute encephalopathy (03/28/2016), Acute on chronic respiratory failure with hypoxia ([place]) (12/04/2016), Arthritis, Asthma, Atrial fibrillation ([place]), Benign neoplasm of ascending colon (05/05/2014), BPH (benign prostatic hypertrophy), CARDIOVASCULAR STUDIES, ABNORMAL  (04/27/2009), Chronic anticoagulation (08/20/2012), Chronic respiratory failure with hypoxia ([place]) (11/10/2017), Colon polyps, COPD mixed type ([place]) (03/28/2016), Diabetes mellitus, Diabetic neuropathy, type II diabetes mellitus ([place]) (09/25/2014), Diabetic retinopathy ([place]) (05/28/2017), 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   He has a past surgical history that includes Appendectomy and Colonoscopy with propofol (N/A, 05/05/2014).   His family history includes Aortic aneurysm in his sister; COPD in his sister and sister; Colon cancer in his father; Colon polyps in his father; Diabetes in his maternal grandmother, mother, and sister; Heart attack in his sister; Heart disease in his mother, sister, and sister; Liver cancer in his mother; Prostate cancer in his father; Pulmonary embolism in his sister.He reports that he quit smoking about a year ago. His smoking use included cigarettes. He started smoking about 50 years ago. He has a 40.00 pack-year smoking history. He has never used smokeless tobacco. He reports that he does not drink alcohol or use drugs.    ROS Review of Systems  Constitutional:  Negative for fever.  Respiratory: Positive for shortness of breath.   Cardiovascular: Negative for chest pain.  Musculoskeletal: Positive for gait problem. Negative for arthralgias.  Skin: Negative for rash.  Neurological: Positive for weakness.     Objective:  BP 137/74   Pulse 95   Temp 98.2 F (36.8 C) (Temporal)   Resp (!) 22   Ht 5\' 9"  (1.753 m)   Wt 286 lb 3.2 oz (129.8 kg)   SpO2 92%   BMI 42.26 kg/m   BP Readings from Last 3 Encounters:  09/25/19 137/74  09/11/19 126/74  08/27/19 138/80    Wt Readings from Last 3 Encounters:  09/25/19 286 lb 3.2 oz (129.8 kg)  08/27/19 285 lb 3.2 oz (129.4 kg)  08/21/19 283 lb 8 oz (128.6 kg)     Physical Exam Vitals reviewed.  Constitutional:      Appearance: He is well-developed. He is ill-appearing.  HENT:     Head: Normocephalic and atraumatic.     Right Ear: External ear normal.     Left Ear: External ear normal.     Mouth/Throat:     Pharynx: No oropharyngeal exudate or posterior oropharyngeal erythema.  Eyes:     Pupils: Pupils are equal, round, and reactive to light.  Cardiovascular:     Rate and Rhythm: Normal rate and regular rhythm.     Heart sounds: No murmur.  Pulmonary:     Effort: No respiratory distress.     Breath sounds: Wheezing and rhonchi present. No rales.  Musculoskeletal:     Cervical back: Normal range of motion and neck supple.  Neurological:     Mental Status: He is alert and oriented to person, place, and time.       Assessment & Plan:   Justin Ruiz was seen today for follow-up.  Diagnoses and all orders for this visit:  Type 2 diabetes mellitus with hyperglycemia, with long-term current use of insulin ([place]) -     Glucose Hemocue Waived  Diabetes mellitus type 2, insulin dependent (HCC) -     Glucose Hemocue Waived  Diabetic retinopathy of both eyes associated with type 2 diabetes mellitus, macular edema presence unspecified, unspecified retinopathy severity (HCC) -     insulin NPH Human (NOVOLIN N) 100 UNIT/ML injection; Inject 0.28 mLs (28 Units total) into the skin 2 (two) times daily before a meal. Breakfast and supper  Other orders -     Dulaglutide (TRULICITY) 4.5 0000000 SOPN; Inject 4.5 mg as directed once a  week.    As a result of his inability to afford his medicines we are following back on samples.  At this time Trulicity has the highest dose of a GLP-1 available.  Therefore he was switched over from Ozempic to the Trulicity 4.5 mg injection once a week.  He is going to monitor for blood sugar coming down as well as side effects primarily just nausea.  Hopefully, along with the increase in insulin noted above we will see his sugars coming into range soon.  His diet was again reviewed.  He seems to be developing more carb consciousness.   I have discontinued Justin Ruiz's Ozempic (1 MG/DOSE). I have also changed his insulin NPH Human. Additionally, I am having him start on Trulicity. Lastly, I am having him maintain his Eliquis, simvastatin, acetaminophen, nystatin, oxymetazoline, insulin regular, albuterol, predniSONE, escitalopram, furosemide, Vitamin D (Cholecalciferol), ALPRAZolam, budesonide, Stiolto Respimat, bisoprolol, and ipratropium-albuterol.  Allergies as of 09/25/2019      Reactions  Avapro [irbesartan] Other (See Comments)   "doesnt sit right with me"--light headed   Lipitor [atorvastatin Calcium] Other (See Comments)   Leg pain      Medication List       Accurate as of September 25, 2019 11:59 PM. If you have any questions, ask your nurse or doctor.        STOP taking these medications   Ozempic (1 MG/DOSE) 2 MG/1.5ML Sopn Generic drug: Semaglutide (1 MG/DOSE) Stopped by: [name], MD     TAKE these medications   acetaminophen 325 MG tablet Commonly known as: TYLENOL Take 650 mg by mouth every 6 (six) hours as needed.   albuterol 108 (90 Base) MCG/ACT inhaler Commonly known as: VENTOLIN HFA Inhale 2 puffs into the lungs every 4 (four) hours as needed for wheezing or shortness of breath.   ALPRAZolam 0.25 MG tablet Commonly known as: XANAX Take 0.25 mg by mouth 3 (three) times daily as needed.   bisoprolol 5 MG tablet Commonly known as: ZEBETA Take 1 tablet  (5 mg total) by mouth 2 (two) times daily.   budesonide 0.25 MG/2ML nebulizer solution Commonly known as: PULMICORT Take 2 mLs (0.25 mg total) by nebulization 2 (two) times daily.   Eliquis 5 MG Tabs tablet Generic drug: apixaban Take 1 tablet by mouth twice daily   escitalopram 20 MG tablet Commonly known as: LEXAPRO Take 1 tablet (20 mg total) by mouth daily.   furosemide 40 MG tablet Commonly known as: LASIX Take 1 tablet (40 mg total) by mouth 2 (two) times daily.   insulin NPH Human 100 UNIT/ML injection Commonly known as: NovoLIN N Inject 0.28 mLs (28 Units total) into the skin 2 (two) times daily before a meal. Breakfast and supper What changed:   how much to take  additional instructions Changed by: [name], MD   insulin regular 100 units/mL injection Commonly known as: NOVOLIN R Inject 0.08 mLs (8 Units total) into the skin 3 (three) times daily before meals.   ipratropium-albuterol 0.5-2.5 (3) MG/3ML Soln Commonly known as: DUONEB USE 1 VIAL IN NEBULIZER EVERY 6 HOURS AS NEEDED   nystatin powder Commonly known as: MYCOSTATIN/NYSTOP Apply topically 2 (two) times daily. Apply to groin area and scrotum skin; keep area clean and dry.   oxymetazoline 0.05 % nasal spray Commonly known as: AFRIN Place 1 spray into both nostrils 2 (two) times daily as needed for congestion.   predniSONE 10 MG tablet Commonly known as: DELTASONE Take 1 tablet (10 mg total) by mouth daily with breakfast.   simvastatin 40 MG tablet Commonly known as: ZOCOR Take 1 tablet by mouth once daily   Stiolto Respimat 2.5-2.5 MCG/ACT Aers Generic drug: Tiotropium Bromide-Olodaterol Inhale 2 puffs into the lungs daily. #28 metered inhalations LOT 11/21 [id number] B sample   Trulicity 4.5 0000000 Sopn Generic drug: Dulaglutide Inject 4.5 mg as directed once a week. Started by: [name], MD   Vitamin D (Cholecalciferol) 50 MCG (2000 UT) Caps Take 1 capsule by mouth daily.         Follow-up: Return in about 1 month (around 10/25/2019).  [name], M.D.

## 2019-09-25 NOTE — Telephone Encounter (Signed)
He isn't ready yet.

## 2019-09-26 DIAGNOSIS — R911 Solitary pulmonary nodule: Secondary | ICD-10-CM | POA: Diagnosis not present

## 2019-09-26 DIAGNOSIS — D696 Thrombocytopenia, unspecified: Secondary | ICD-10-CM | POA: Diagnosis not present

## 2019-09-26 DIAGNOSIS — D472 Monoclonal gammopathy: Secondary | ICD-10-CM | POA: Diagnosis not present

## 2019-09-26 DIAGNOSIS — G4733 Obstructive sleep apnea (adult) (pediatric): Secondary | ICD-10-CM | POA: Diagnosis not present

## 2019-09-26 DIAGNOSIS — I482 Chronic atrial fibrillation, unspecified: Secondary | ICD-10-CM | POA: Diagnosis not present

## 2019-09-26 DIAGNOSIS — J449 Chronic obstructive pulmonary disease, unspecified: Secondary | ICD-10-CM | POA: Diagnosis not present

## 2019-09-26 DIAGNOSIS — M1990 Unspecified osteoarthritis, unspecified site: Secondary | ICD-10-CM | POA: Diagnosis not present

## 2019-09-26 DIAGNOSIS — E785 Hyperlipidemia, unspecified: Secondary | ICD-10-CM | POA: Diagnosis not present

## 2019-09-26 DIAGNOSIS — E11319 Type 2 diabetes mellitus with unspecified diabetic retinopathy without macular edema: Secondary | ICD-10-CM | POA: Diagnosis not present

## 2019-09-26 DIAGNOSIS — J9621 Acute and chronic respiratory failure with hypoxia: Secondary | ICD-10-CM | POA: Diagnosis not present

## 2019-09-26 DIAGNOSIS — Z7901 Long term (current) use of anticoagulants: Secondary | ICD-10-CM | POA: Diagnosis not present

## 2019-09-26 DIAGNOSIS — S91114D Laceration without foreign body of right lesser toe(s) without damage to nail, subsequent encounter: Secondary | ICD-10-CM | POA: Diagnosis not present

## 2019-09-26 DIAGNOSIS — I872 Venous insufficiency (chronic) (peripheral): Secondary | ICD-10-CM | POA: Diagnosis not present

## 2019-09-26 DIAGNOSIS — K219 Gastro-esophageal reflux disease without esophagitis: Secondary | ICD-10-CM | POA: Diagnosis not present

## 2019-09-26 DIAGNOSIS — I5033 Acute on chronic diastolic (congestive) heart failure: Secondary | ICD-10-CM | POA: Diagnosis not present

## 2019-09-26 DIAGNOSIS — Z9981 Dependence on supplemental oxygen: Secondary | ICD-10-CM | POA: Diagnosis not present

## 2019-09-26 DIAGNOSIS — Z794 Long term (current) use of insulin: Secondary | ICD-10-CM | POA: Diagnosis not present

## 2019-09-26 DIAGNOSIS — E1140 Type 2 diabetes mellitus with diabetic neuropathy, unspecified: Secondary | ICD-10-CM | POA: Diagnosis not present

## 2019-09-26 DIAGNOSIS — C8590 Non-Hodgkin lymphoma, unspecified, unspecified site: Secondary | ICD-10-CM | POA: Diagnosis not present

## 2019-09-26 DIAGNOSIS — I110 Hypertensive heart disease with heart failure: Secondary | ICD-10-CM | POA: Diagnosis not present

## 2019-09-26 DIAGNOSIS — E1151 Type 2 diabetes mellitus with diabetic peripheral angiopathy without gangrene: Secondary | ICD-10-CM | POA: Diagnosis not present

## 2019-09-27 DIAGNOSIS — Z0100 Encounter for examination of eyes and vision without abnormal findings: Secondary | ICD-10-CM | POA: Diagnosis not present

## 2019-09-27 DIAGNOSIS — E109 Type 1 diabetes mellitus without complications: Secondary | ICD-10-CM | POA: Diagnosis not present

## 2019-09-29 ENCOUNTER — Encounter: Payer: Self-pay | Admitting: Family Medicine

## 2019-09-29 ENCOUNTER — Other Ambulatory Visit: Payer: Self-pay | Admitting: Family Medicine

## 2019-10-01 DIAGNOSIS — E1151 Type 2 diabetes mellitus with diabetic peripheral angiopathy without gangrene: Secondary | ICD-10-CM | POA: Diagnosis not present

## 2019-10-01 DIAGNOSIS — E11319 Type 2 diabetes mellitus with unspecified diabetic retinopathy without macular edema: Secondary | ICD-10-CM | POA: Diagnosis not present

## 2019-10-01 DIAGNOSIS — D472 Monoclonal gammopathy: Secondary | ICD-10-CM | POA: Diagnosis not present

## 2019-10-01 DIAGNOSIS — I110 Hypertensive heart disease with heart failure: Secondary | ICD-10-CM | POA: Diagnosis not present

## 2019-10-01 DIAGNOSIS — R911 Solitary pulmonary nodule: Secondary | ICD-10-CM | POA: Diagnosis not present

## 2019-10-01 DIAGNOSIS — M1990 Unspecified osteoarthritis, unspecified site: Secondary | ICD-10-CM | POA: Diagnosis not present

## 2019-10-01 DIAGNOSIS — E1140 Type 2 diabetes mellitus with diabetic neuropathy, unspecified: Secondary | ICD-10-CM | POA: Diagnosis not present

## 2019-10-01 DIAGNOSIS — I482 Chronic atrial fibrillation, unspecified: Secondary | ICD-10-CM | POA: Diagnosis not present

## 2019-10-01 DIAGNOSIS — J9621 Acute and chronic respiratory failure with hypoxia: Secondary | ICD-10-CM | POA: Diagnosis not present

## 2019-10-01 DIAGNOSIS — K219 Gastro-esophageal reflux disease without esophagitis: Secondary | ICD-10-CM | POA: Diagnosis not present

## 2019-10-01 DIAGNOSIS — Z794 Long term (current) use of insulin: Secondary | ICD-10-CM | POA: Diagnosis not present

## 2019-10-01 DIAGNOSIS — Z7901 Long term (current) use of anticoagulants: Secondary | ICD-10-CM | POA: Diagnosis not present

## 2019-10-01 DIAGNOSIS — I5033 Acute on chronic diastolic (congestive) heart failure: Secondary | ICD-10-CM | POA: Diagnosis not present

## 2019-10-01 DIAGNOSIS — D696 Thrombocytopenia, unspecified: Secondary | ICD-10-CM | POA: Diagnosis not present

## 2019-10-01 DIAGNOSIS — C8590 Non-Hodgkin lymphoma, unspecified, unspecified site: Secondary | ICD-10-CM | POA: Diagnosis not present

## 2019-10-01 DIAGNOSIS — E785 Hyperlipidemia, unspecified: Secondary | ICD-10-CM | POA: Diagnosis not present

## 2019-10-01 DIAGNOSIS — Z9981 Dependence on supplemental oxygen: Secondary | ICD-10-CM | POA: Diagnosis not present

## 2019-10-01 DIAGNOSIS — S91114D Laceration without foreign body of right lesser toe(s) without damage to nail, subsequent encounter: Secondary | ICD-10-CM | POA: Diagnosis not present

## 2019-10-01 DIAGNOSIS — G4733 Obstructive sleep apnea (adult) (pediatric): Secondary | ICD-10-CM | POA: Diagnosis not present

## 2019-10-01 DIAGNOSIS — J449 Chronic obstructive pulmonary disease, unspecified: Secondary | ICD-10-CM | POA: Diagnosis not present

## 2019-10-01 DIAGNOSIS — I872 Venous insufficiency (chronic) (peripheral): Secondary | ICD-10-CM | POA: Diagnosis not present

## 2019-10-02 ENCOUNTER — Telehealth: Payer: Self-pay | Admitting: Pharmacist

## 2019-10-02 DIAGNOSIS — M1990 Unspecified osteoarthritis, unspecified site: Secondary | ICD-10-CM | POA: Diagnosis not present

## 2019-10-02 DIAGNOSIS — D696 Thrombocytopenia, unspecified: Secondary | ICD-10-CM | POA: Diagnosis not present

## 2019-10-02 DIAGNOSIS — J9621 Acute and chronic respiratory failure with hypoxia: Secondary | ICD-10-CM | POA: Diagnosis not present

## 2019-10-02 DIAGNOSIS — J449 Chronic obstructive pulmonary disease, unspecified: Secondary | ICD-10-CM | POA: Diagnosis not present

## 2019-10-02 DIAGNOSIS — I872 Venous insufficiency (chronic) (peripheral): Secondary | ICD-10-CM | POA: Diagnosis not present

## 2019-10-02 DIAGNOSIS — Z794 Long term (current) use of insulin: Secondary | ICD-10-CM | POA: Diagnosis not present

## 2019-10-02 DIAGNOSIS — Z7901 Long term (current) use of anticoagulants: Secondary | ICD-10-CM | POA: Diagnosis not present

## 2019-10-02 DIAGNOSIS — E785 Hyperlipidemia, unspecified: Secondary | ICD-10-CM | POA: Diagnosis not present

## 2019-10-02 DIAGNOSIS — I5033 Acute on chronic diastolic (congestive) heart failure: Secondary | ICD-10-CM | POA: Diagnosis not present

## 2019-10-02 DIAGNOSIS — G4733 Obstructive sleep apnea (adult) (pediatric): Secondary | ICD-10-CM | POA: Diagnosis not present

## 2019-10-02 DIAGNOSIS — E11319 Type 2 diabetes mellitus with unspecified diabetic retinopathy without macular edema: Secondary | ICD-10-CM | POA: Diagnosis not present

## 2019-10-02 DIAGNOSIS — I110 Hypertensive heart disease with heart failure: Secondary | ICD-10-CM | POA: Diagnosis not present

## 2019-10-02 DIAGNOSIS — D472 Monoclonal gammopathy: Secondary | ICD-10-CM | POA: Diagnosis not present

## 2019-10-02 DIAGNOSIS — E1140 Type 2 diabetes mellitus with diabetic neuropathy, unspecified: Secondary | ICD-10-CM | POA: Diagnosis not present

## 2019-10-02 DIAGNOSIS — K219 Gastro-esophageal reflux disease without esophagitis: Secondary | ICD-10-CM | POA: Diagnosis not present

## 2019-10-02 DIAGNOSIS — S91114D Laceration without foreign body of right lesser toe(s) without damage to nail, subsequent encounter: Secondary | ICD-10-CM | POA: Diagnosis not present

## 2019-10-02 DIAGNOSIS — R911 Solitary pulmonary nodule: Secondary | ICD-10-CM | POA: Diagnosis not present

## 2019-10-02 DIAGNOSIS — C8590 Non-Hodgkin lymphoma, unspecified, unspecified site: Secondary | ICD-10-CM | POA: Diagnosis not present

## 2019-10-02 DIAGNOSIS — I482 Chronic atrial fibrillation, unspecified: Secondary | ICD-10-CM | POA: Diagnosis not present

## 2019-10-02 DIAGNOSIS — Z9981 Dependence on supplemental oxygen: Secondary | ICD-10-CM | POA: Diagnosis not present

## 2019-10-02 DIAGNOSIS — E1151 Type 2 diabetes mellitus with diabetic peripheral angiopathy without gangrene: Secondary | ICD-10-CM | POA: Diagnosis not present

## 2019-10-02 NOTE — Telephone Encounter (Signed)
Patient approved for Eliquis via [employer] through 04/24/20 He is aware of this enrollment.  Medication will ship to patient's home  Refill request sent to [employer] for Ozempic 2mg  (may not cover since dose not FDA approved yet).  We can plan to transition to Trulicity 3mg  vs 4.5 weekly if not.  Patient's BG is still running in the 300s, however this is post prandial

## 2019-10-04 DIAGNOSIS — E1151 Type 2 diabetes mellitus with diabetic peripheral angiopathy without gangrene: Secondary | ICD-10-CM | POA: Diagnosis not present

## 2019-10-04 DIAGNOSIS — E785 Hyperlipidemia, unspecified: Secondary | ICD-10-CM | POA: Diagnosis not present

## 2019-10-04 DIAGNOSIS — R911 Solitary pulmonary nodule: Secondary | ICD-10-CM | POA: Diagnosis not present

## 2019-10-04 DIAGNOSIS — E11319 Type 2 diabetes mellitus with unspecified diabetic retinopathy without macular edema: Secondary | ICD-10-CM | POA: Diagnosis not present

## 2019-10-04 DIAGNOSIS — I110 Hypertensive heart disease with heart failure: Secondary | ICD-10-CM | POA: Diagnosis not present

## 2019-10-04 DIAGNOSIS — J449 Chronic obstructive pulmonary disease, unspecified: Secondary | ICD-10-CM | POA: Diagnosis not present

## 2019-10-04 DIAGNOSIS — S91114D Laceration without foreign body of right lesser toe(s) without damage to nail, subsequent encounter: Secondary | ICD-10-CM | POA: Diagnosis not present

## 2019-10-04 DIAGNOSIS — Z7901 Long term (current) use of anticoagulants: Secondary | ICD-10-CM | POA: Diagnosis not present

## 2019-10-04 DIAGNOSIS — I872 Venous insufficiency (chronic) (peripheral): Secondary | ICD-10-CM | POA: Diagnosis not present

## 2019-10-04 DIAGNOSIS — Z9981 Dependence on supplemental oxygen: Secondary | ICD-10-CM | POA: Diagnosis not present

## 2019-10-04 DIAGNOSIS — K219 Gastro-esophageal reflux disease without esophagitis: Secondary | ICD-10-CM | POA: Diagnosis not present

## 2019-10-04 DIAGNOSIS — C8590 Non-Hodgkin lymphoma, unspecified, unspecified site: Secondary | ICD-10-CM | POA: Diagnosis not present

## 2019-10-04 DIAGNOSIS — D472 Monoclonal gammopathy: Secondary | ICD-10-CM | POA: Diagnosis not present

## 2019-10-04 DIAGNOSIS — I5033 Acute on chronic diastolic (congestive) heart failure: Secondary | ICD-10-CM | POA: Diagnosis not present

## 2019-10-04 DIAGNOSIS — E1140 Type 2 diabetes mellitus with diabetic neuropathy, unspecified: Secondary | ICD-10-CM | POA: Diagnosis not present

## 2019-10-04 DIAGNOSIS — G4733 Obstructive sleep apnea (adult) (pediatric): Secondary | ICD-10-CM | POA: Diagnosis not present

## 2019-10-04 DIAGNOSIS — J9621 Acute and chronic respiratory failure with hypoxia: Secondary | ICD-10-CM | POA: Diagnosis not present

## 2019-10-04 DIAGNOSIS — I482 Chronic atrial fibrillation, unspecified: Secondary | ICD-10-CM | POA: Diagnosis not present

## 2019-10-04 DIAGNOSIS — Z794 Long term (current) use of insulin: Secondary | ICD-10-CM | POA: Diagnosis not present

## 2019-10-04 DIAGNOSIS — M1990 Unspecified osteoarthritis, unspecified site: Secondary | ICD-10-CM | POA: Diagnosis not present

## 2019-10-04 DIAGNOSIS — D696 Thrombocytopenia, unspecified: Secondary | ICD-10-CM | POA: Diagnosis not present

## 2019-10-05 DIAGNOSIS — J9611 Chronic respiratory failure with hypoxia: Secondary | ICD-10-CM | POA: Diagnosis not present

## 2019-10-05 DIAGNOSIS — I872 Venous insufficiency (chronic) (peripheral): Secondary | ICD-10-CM | POA: Diagnosis not present

## 2019-10-05 DIAGNOSIS — M6281 Muscle weakness (generalized): Secondary | ICD-10-CM | POA: Diagnosis not present

## 2019-10-05 DIAGNOSIS — I509 Heart failure, unspecified: Secondary | ICD-10-CM | POA: Diagnosis not present

## 2019-10-07 DIAGNOSIS — J449 Chronic obstructive pulmonary disease, unspecified: Secondary | ICD-10-CM | POA: Diagnosis not present

## 2019-10-08 ENCOUNTER — Telehealth: Payer: Self-pay

## 2019-10-08 DIAGNOSIS — K219 Gastro-esophageal reflux disease without esophagitis: Secondary | ICD-10-CM | POA: Diagnosis not present

## 2019-10-08 DIAGNOSIS — Z794 Long term (current) use of insulin: Secondary | ICD-10-CM | POA: Diagnosis not present

## 2019-10-08 DIAGNOSIS — D472 Monoclonal gammopathy: Secondary | ICD-10-CM | POA: Diagnosis not present

## 2019-10-08 DIAGNOSIS — J9621 Acute and chronic respiratory failure with hypoxia: Secondary | ICD-10-CM | POA: Diagnosis not present

## 2019-10-08 DIAGNOSIS — E1151 Type 2 diabetes mellitus with diabetic peripheral angiopathy without gangrene: Secondary | ICD-10-CM | POA: Diagnosis not present

## 2019-10-08 DIAGNOSIS — S91114D Laceration without foreign body of right lesser toe(s) without damage to nail, subsequent encounter: Secondary | ICD-10-CM | POA: Diagnosis not present

## 2019-10-08 DIAGNOSIS — I872 Venous insufficiency (chronic) (peripheral): Secondary | ICD-10-CM | POA: Diagnosis not present

## 2019-10-08 DIAGNOSIS — Z7901 Long term (current) use of anticoagulants: Secondary | ICD-10-CM | POA: Diagnosis not present

## 2019-10-08 DIAGNOSIS — C8590 Non-Hodgkin lymphoma, unspecified, unspecified site: Secondary | ICD-10-CM | POA: Diagnosis not present

## 2019-10-08 DIAGNOSIS — I482 Chronic atrial fibrillation, unspecified: Secondary | ICD-10-CM | POA: Diagnosis not present

## 2019-10-08 DIAGNOSIS — M1990 Unspecified osteoarthritis, unspecified site: Secondary | ICD-10-CM | POA: Diagnosis not present

## 2019-10-08 DIAGNOSIS — I110 Hypertensive heart disease with heart failure: Secondary | ICD-10-CM | POA: Diagnosis not present

## 2019-10-08 DIAGNOSIS — Z9981 Dependence on supplemental oxygen: Secondary | ICD-10-CM | POA: Diagnosis not present

## 2019-10-08 DIAGNOSIS — E1140 Type 2 diabetes mellitus with diabetic neuropathy, unspecified: Secondary | ICD-10-CM | POA: Diagnosis not present

## 2019-10-08 DIAGNOSIS — G4733 Obstructive sleep apnea (adult) (pediatric): Secondary | ICD-10-CM | POA: Diagnosis not present

## 2019-10-08 DIAGNOSIS — J449 Chronic obstructive pulmonary disease, unspecified: Secondary | ICD-10-CM | POA: Diagnosis not present

## 2019-10-08 DIAGNOSIS — E11319 Type 2 diabetes mellitus with unspecified diabetic retinopathy without macular edema: Secondary | ICD-10-CM | POA: Diagnosis not present

## 2019-10-08 DIAGNOSIS — R911 Solitary pulmonary nodule: Secondary | ICD-10-CM | POA: Diagnosis not present

## 2019-10-08 DIAGNOSIS — I5033 Acute on chronic diastolic (congestive) heart failure: Secondary | ICD-10-CM | POA: Diagnosis not present

## 2019-10-08 DIAGNOSIS — E785 Hyperlipidemia, unspecified: Secondary | ICD-10-CM | POA: Diagnosis not present

## 2019-10-08 DIAGNOSIS — D696 Thrombocytopenia, unspecified: Secondary | ICD-10-CM | POA: Diagnosis not present

## 2019-10-09 ENCOUNTER — Encounter (HOSPITAL_COMMUNITY): Payer: Self-pay | Admitting: *Deleted

## 2019-10-09 ENCOUNTER — Other Ambulatory Visit: Payer: Self-pay

## 2019-10-09 ENCOUNTER — Observation Stay (HOSPITAL_COMMUNITY)
Admission: AD | Admit: 2019-10-09 | Discharge: 2019-10-10 | Disposition: A | Discharge status: Home / Self Care | Payer: Medicare Other | Attending: Internal Medicine | Admitting: Internal Medicine

## 2019-10-09 ENCOUNTER — Emergency Department (HOSPITAL_COMMUNITY): Payer: Medicare Other

## 2019-10-09 ENCOUNTER — Other Ambulatory Visit: Payer: Self-pay | Admitting: *Deleted

## 2019-10-09 DIAGNOSIS — E1165 Type 2 diabetes mellitus with hyperglycemia: Secondary | ICD-10-CM | POA: Diagnosis not present

## 2019-10-09 DIAGNOSIS — I4891 Unspecified atrial fibrillation: Secondary | ICD-10-CM | POA: Diagnosis not present

## 2019-10-09 DIAGNOSIS — J90 Pleural effusion, not elsewhere classified: Secondary | ICD-10-CM | POA: Diagnosis not present

## 2019-10-09 DIAGNOSIS — D472 Monoclonal gammopathy: Secondary | ICD-10-CM | POA: Diagnosis not present

## 2019-10-09 DIAGNOSIS — G4733 Obstructive sleep apnea (adult) (pediatric): Secondary | ICD-10-CM | POA: Diagnosis not present

## 2019-10-09 DIAGNOSIS — I5032 Chronic diastolic (congestive) heart failure: Secondary | ICD-10-CM | POA: Diagnosis not present

## 2019-10-09 DIAGNOSIS — Z7951 Long term (current) use of inhaled steroids: Secondary | ICD-10-CM | POA: Insufficient documentation

## 2019-10-09 DIAGNOSIS — E11319 Type 2 diabetes mellitus with unspecified diabetic retinopathy without macular edema: Secondary | ICD-10-CM | POA: Diagnosis not present

## 2019-10-09 DIAGNOSIS — I10 Essential (primary) hypertension: Secondary | ICD-10-CM | POA: Diagnosis present

## 2019-10-09 DIAGNOSIS — R911 Solitary pulmonary nodule: Secondary | ICD-10-CM | POA: Diagnosis not present

## 2019-10-09 DIAGNOSIS — J9621 Acute and chronic respiratory failure with hypoxia: Secondary | ICD-10-CM | POA: Diagnosis not present

## 2019-10-09 DIAGNOSIS — K219 Gastro-esophageal reflux disease without esophagitis: Secondary | ICD-10-CM | POA: Insufficient documentation

## 2019-10-09 DIAGNOSIS — Z87891 Personal history of nicotine dependence: Secondary | ICD-10-CM | POA: Insufficient documentation

## 2019-10-09 DIAGNOSIS — Z6841 Body Mass Index (BMI) 40.0 and over, adult: Secondary | ICD-10-CM | POA: Diagnosis not present

## 2019-10-09 DIAGNOSIS — Z7901 Long term (current) use of anticoagulants: Secondary | ICD-10-CM | POA: Diagnosis not present

## 2019-10-09 DIAGNOSIS — F411 Generalized anxiety disorder: Secondary | ICD-10-CM | POA: Diagnosis present

## 2019-10-09 DIAGNOSIS — Z9981 Dependence on supplemental oxygen: Secondary | ICD-10-CM | POA: Diagnosis not present

## 2019-10-09 DIAGNOSIS — E1140 Type 2 diabetes mellitus with diabetic neuropathy, unspecified: Secondary | ICD-10-CM | POA: Diagnosis not present

## 2019-10-09 DIAGNOSIS — I110 Hypertensive heart disease with heart failure: Secondary | ICD-10-CM | POA: Diagnosis not present

## 2019-10-09 DIAGNOSIS — R0602 Shortness of breath: Secondary | ICD-10-CM | POA: Diagnosis not present

## 2019-10-09 DIAGNOSIS — I482 Chronic atrial fibrillation, unspecified: Principal | ICD-10-CM | POA: Insufficient documentation

## 2019-10-09 DIAGNOSIS — J9611 Chronic respiratory failure with hypoxia: Secondary | ICD-10-CM | POA: Insufficient documentation

## 2019-10-09 DIAGNOSIS — M1990 Unspecified osteoarthritis, unspecified site: Secondary | ICD-10-CM | POA: Diagnosis not present

## 2019-10-09 DIAGNOSIS — Z79899 Other long term (current) drug therapy: Secondary | ICD-10-CM | POA: Insufficient documentation

## 2019-10-09 DIAGNOSIS — Z20822 Contact with and (suspected) exposure to covid-19: Secondary | ICD-10-CM | POA: Diagnosis not present

## 2019-10-09 DIAGNOSIS — N400 Enlarged prostate without lower urinary tract symptoms: Secondary | ICD-10-CM | POA: Insufficient documentation

## 2019-10-09 DIAGNOSIS — Z7952 Long term (current) use of systemic steroids: Secondary | ICD-10-CM | POA: Insufficient documentation

## 2019-10-09 DIAGNOSIS — J449 Chronic obstructive pulmonary disease, unspecified: Secondary | ICD-10-CM | POA: Insufficient documentation

## 2019-10-09 DIAGNOSIS — D696 Thrombocytopenia, unspecified: Secondary | ICD-10-CM | POA: Diagnosis not present

## 2019-10-09 DIAGNOSIS — Z794 Long term (current) use of insulin: Secondary | ICD-10-CM | POA: Insufficient documentation

## 2019-10-09 DIAGNOSIS — I872 Venous insufficiency (chronic) (peripheral): Secondary | ICD-10-CM | POA: Diagnosis not present

## 2019-10-09 DIAGNOSIS — J45909 Unspecified asthma, uncomplicated: Secondary | ICD-10-CM | POA: Diagnosis not present

## 2019-10-09 DIAGNOSIS — E1151 Type 2 diabetes mellitus with diabetic peripheral angiopathy without gangrene: Secondary | ICD-10-CM | POA: Diagnosis not present

## 2019-10-09 DIAGNOSIS — I5033 Acute on chronic diastolic (congestive) heart failure: Secondary | ICD-10-CM | POA: Diagnosis not present

## 2019-10-09 DIAGNOSIS — S91114D Laceration without foreign body of right lesser toe(s) without damage to nail, subsequent encounter: Secondary | ICD-10-CM | POA: Diagnosis not present

## 2019-10-09 DIAGNOSIS — C8590 Non-Hodgkin lymphoma, unspecified, unspecified site: Secondary | ICD-10-CM | POA: Diagnosis not present

## 2019-10-09 DIAGNOSIS — E785 Hyperlipidemia, unspecified: Secondary | ICD-10-CM | POA: Diagnosis not present

## 2019-10-09 DIAGNOSIS — E119 Type 2 diabetes mellitus without complications: Secondary | ICD-10-CM

## 2019-10-09 LAB — CBG MONITORING, ED
Glucose-Capillary: >600 mg/dL (ref 70–99)
Glucose-Capillary: 579 mg/dL (ref 70–99)
Glucose-Capillary: 172 mg/dL — ABNORMAL HIGH (ref 70–99)
Glucose-Capillary: 244 mg/dL — ABNORMAL HIGH (ref 70–99)

## 2019-10-09 LAB — BASIC METABOLIC PANEL
Anion gap: 11 (ref 5–15)
Anion gap: 9 (ref 5–15)
BUN: 31 mg/dL — ABNORMAL HIGH (ref 8–23)
BUN: 30 mg/dL — ABNORMAL HIGH (ref 8–23)
CO2: 28 mmol/L (ref 22–32)
CO2: 30 mmol/L (ref 22–32)
Calcium: 9.6 mg/dL (ref 8.9–10.3)
Calcium: 9.8 mg/dL (ref 8.9–10.3)
Chloride: 92 mmol/L — ABNORMAL LOW (ref 98–111)
Chloride: 97 mmol/L — ABNORMAL LOW (ref 98–111)
Creatinine, Ser: 1.45 mg/dL — ABNORMAL HIGH (ref 0.61–1.24)
Creatinine, Ser: 1.29 mg/dL — ABNORMAL HIGH (ref 0.61–1.24)
GFR calc Af Amer: 57 mL/min — ABNORMAL LOW (ref >60)
GFR calc Af Amer: >60 mL/min (ref >60)
GFR calc non Af Amer: 49 mL/min — ABNORMAL LOW (ref >60)
GFR calc non Af Amer: 57 mL/min — ABNORMAL LOW (ref >60)
Glucose, Bld: 616 mg/dL (ref 70–99)
Glucose, Bld: 298 mg/dL — ABNORMAL HIGH (ref 70–99)
Potassium: 4.8 mmol/L (ref 3.5–5.1)
Potassium: 4.5 mmol/L (ref 3.5–5.1)
Sodium: 131 mmol/L — ABNORMAL LOW (ref 135–145)
Sodium: 136 mmol/L (ref 135–145)

## 2019-10-09 LAB — CBC WITH DIFFERENTIAL/PLATELET
Abs Immature Granulocytes: 0.02 10*3/uL (ref 0.00–0.07)
Basophils Absolute: 0.1 10*3/uL (ref 0.0–0.1)
Basophils Relative: 1 %
Eosinophils Absolute: 0.1 10*3/uL (ref 0.0–0.5)
Eosinophils Relative: 1 %
HCT: 39 % (ref 39.0–52.0)
Hemoglobin: 12.7 g/dL — ABNORMAL LOW (ref 13.0–17.0)
Immature Granulocytes: 0 %
Lymphocytes Relative: 12 %
Lymphs Abs: 0.8 10*3/uL (ref 0.7–4.0)
MCH: 29.4 pg (ref 26.0–34.0)
MCHC: 32.6 g/dL (ref 30.0–36.0)
MCV: 90.3 fL (ref 80.0–100.0)
Monocytes Absolute: 0.4 10*3/uL (ref 0.1–1.0)
Monocytes Relative: 6 %
Neutro Abs: 5.4 10*3/uL (ref 1.7–7.7)
Neutrophils Relative %: 80 %
Platelets: 209 10*3/uL (ref 150–400)
RBC: 4.32 MIL/uL (ref 4.22–5.81)
RDW: 14.5 % (ref 11.5–15.5)
WBC: 6.7 10*3/uL (ref 4.0–10.5)
nRBC: 0 % (ref 0.0–0.2)

## 2019-10-09 LAB — URINALYSIS, ROUTINE W REFLEX MICROSCOPIC
Bacteria, UA: NONE SEEN
Bilirubin Urine: NEGATIVE
Glucose, UA: >=500 mg/dL — AB
Ketones, ur: NEGATIVE mg/dL
Leukocytes,Ua: NEGATIVE
Nitrite: NEGATIVE
Protein, ur: NEGATIVE mg/dL
Specific Gravity, Urine: 1.025 (ref 1.005–1.030)
pH: 6 (ref 5.0–8.0)

## 2019-10-09 LAB — BETA-HYDROXYBUTYRIC ACID: Beta-Hydroxybutyric Acid: 0.07 mmol/L (ref 0.05–0.27)

## 2019-10-09 LAB — TROPONIN I (HIGH SENSITIVITY)
Troponin I (High Sensitivity): 10 ng/L (ref <18)
Troponin I (High Sensitivity): 14 ng/L (ref <18)

## 2019-10-09 LAB — SARS CORONAVIRUS 2 BY RT PCR (HOSPITAL ORDER, PERFORMED IN ~~LOC~~ HOSPITAL LAB): SARS Coronavirus 2: NEGATIVE

## 2019-10-09 LAB — BRAIN NATRIURETIC PEPTIDE: B Natriuretic Peptide: 302 pg/mL — ABNORMAL HIGH (ref 0.0–100.0)

## 2019-10-09 MED ORDER — INSULIN REGULAR(HUMAN) IN NACL 100-0.9 UT/100ML-% IV SOLN: INTRAVENOUS | Status: DC

## 2019-10-09 MED ORDER — BISOPROLOL FUMARATE 5 MG PO TABS
5.0000 mg | ORAL_TABLET | Freq: Two times a day (BID) | ORAL | Status: DC
  Administered 2019-10-09 – 2019-10-10 (×2): 5 mg via ORAL
  Filled 2019-10-09 (×2): qty 1

## 2019-10-09 MED ORDER — SODIUM CHLORIDE 0.9 % IV SOLN: INTRAVENOUS | Status: DC

## 2019-10-09 MED ORDER — DILTIAZEM HCL-DEXTROSE 125-5 MG/125ML-% IV SOLN (PREMIX)
5.0000 mg/h | INTRAVENOUS | Status: DC
  Administered 2019-10-09: 5 mg/h via INTRAVENOUS
  Filled 2019-10-09: qty 125

## 2019-10-09 MED ORDER — INSULIN ASPART 100 UNIT/ML ~~LOC~~ SOLN
0.0000 [IU] | Freq: Three times a day (TID) | SUBCUTANEOUS | Status: DC
  Administered 2019-10-10: 8 [IU] via SUBCUTANEOUS
  Administered 2019-10-10: 11 [IU] via SUBCUTANEOUS

## 2019-10-09 MED ORDER — INSULIN ASPART 100 UNIT/ML ~~LOC~~ SOLN
10.0000 [IU] | Freq: Once | SUBCUTANEOUS | Status: AC
  Administered 2019-10-09: 10 [IU] via INTRAVENOUS
  Filled 2019-10-09: qty 1

## 2019-10-09 MED ORDER — ACETAMINOPHEN 325 MG PO TABS: 650.0000 mg | ORAL_TABLET | Freq: Four times a day (QID) | ORAL | Status: DC | PRN

## 2019-10-09 MED ORDER — ACETAMINOPHEN 650 MG RE SUPP: 650.0000 mg | Freq: Four times a day (QID) | RECTAL | Status: DC | PRN

## 2019-10-09 MED ORDER — ONDANSETRON HCL 4 MG/2ML IJ SOLN
4.0000 mg | Freq: Once | INTRAMUSCULAR | Status: AC
  Administered 2019-10-09: 4 mg via INTRAVENOUS
  Filled 2019-10-09: qty 2

## 2019-10-09 MED ORDER — IPRATROPIUM-ALBUTEROL 0.5-2.5 (3) MG/3ML IN SOLN: 3.0000 mL | RESPIRATORY_TRACT | Status: DC | PRN

## 2019-10-09 MED ORDER — INSULIN NPH (HUMAN) (ISOPHANE) 100 UNIT/ML ~~LOC~~ SUSP
15.0000 [IU] | Freq: Two times a day (BID) | SUBCUTANEOUS | Status: DC
  Administered 2019-10-09 – 2019-10-10 (×2): 15 [IU] via SUBCUTANEOUS
  Filled 2019-10-09 (×2): qty 10

## 2019-10-09 MED ORDER — DILTIAZEM LOAD VIA INFUSION
20.0000 mg | Freq: Once | INTRAVENOUS | Status: AC
  Administered 2019-10-09: 20 mg via INTRAVENOUS
  Filled 2019-10-09: qty 20

## 2019-10-09 MED ORDER — SIMVASTATIN 20 MG PO TABS
40.0000 mg | ORAL_TABLET | Freq: Every day | ORAL | Status: DC
  Administered 2019-10-09: 40 mg via ORAL
  Filled 2019-10-09: qty 2

## 2019-10-09 MED ORDER — BUDESONIDE 0.25 MG/2ML IN SUSP: 0.2500 mg | Freq: Two times a day (BID) | RESPIRATORY_TRACT | Status: DC

## 2019-10-09 MED ORDER — ESCITALOPRAM OXALATE 10 MG PO TABS
20.0000 mg | ORAL_TABLET | Freq: Every day | ORAL | Status: DC
  Administered 2019-10-10: 20 mg via ORAL
  Filled 2019-10-09: qty 2

## 2019-10-09 MED ORDER — APIXABAN 5 MG PO TABS
5.0000 mg | ORAL_TABLET | Freq: Two times a day (BID) | ORAL | Status: DC
  Administered 2019-10-09 – 2019-10-10 (×2): 5 mg via ORAL
  Filled 2019-10-09 (×2): qty 1

## 2019-10-09 MED ORDER — DEXTROSE-NACL 5-0.45 % IV SOLN: INTRAVENOUS | Status: DC

## 2019-10-09 MED ORDER — DEXTROSE 50 % IV SOLN: 0.0000 mL | INTRAVENOUS | Status: DC | PRN

## 2019-10-09 MED ORDER — POLYETHYLENE GLYCOL 3350 17 G PO PACK: 17.0000 g | PACK | Freq: Every day | ORAL | Status: DC | PRN

## 2019-10-09 MED ORDER — FUROSEMIDE 40 MG PO TABS
40.0000 mg | ORAL_TABLET | Freq: Two times a day (BID) | ORAL | Status: DC
  Administered 2019-10-10: 40 mg via ORAL
  Filled 2019-10-09: qty 1

## 2019-10-09 MED ORDER — INSULIN ASPART 100 UNIT/ML ~~LOC~~ SOLN
0.0000 [IU] | Freq: Every day | SUBCUTANEOUS | Status: DC
  Administered 2019-10-09: 2 [IU] via SUBCUTANEOUS

## 2019-10-09 MED ORDER — BUDESONIDE 0.25 MG/2ML IN SUSP
0.2500 mg | Freq: Two times a day (BID) | RESPIRATORY_TRACT | Status: DC
  Administered 2019-10-10: 0.25 mg via RESPIRATORY_TRACT
  Filled 2019-10-09: qty 2

## 2019-10-09 MED ORDER — POTASSIUM CHLORIDE 10 MEQ/100ML IV SOLN: 10.0000 meq | INTRAVENOUS | Status: DC

## 2019-10-09 MED ORDER — MORPHINE SULFATE (PF) 4 MG/ML IV SOLN
4.0000 mg | Freq: Once | INTRAVENOUS | Status: AC
  Administered 2019-10-09: 4 mg via INTRAVENOUS
  Filled 2019-10-09: qty 1

## 2019-10-09 NOTE — ED Notes (Signed)
Attempted report x1. 

## 2019-10-09 NOTE — ED Notes (Signed)
ED TO INPATIENT HANDOFF REPORT  ED Nurse Name and Phone #:    S Name/Age/Gender Justin Ruiz [place] 69 y.o. male Room/Bed: APA02/APA02  Code Status   Code Status: Prior  Home/SNF/Other Home Patient oriented to: self, place, time and situation Is this baseline? Yes   Triage Complete: Triage complete  Chief Complaint Atrial fibrillation with rapid ventricular response ([place]) [I48.91]  Triage Note Pt c/o sob that started getting worse yesterday; pt states ems came to his house today and checked his cbg and the reading was over 600; pt denies any pain    Allergies Allergies  Allergen Reactions  . Avapro [Irbesartan] Other (See Comments)    "doesnt sit right with me"--light headed  . Lipitor [Atorvastatin Calcium] Other (See Comments)    Leg pain    Level of Care/Admitting Diagnosis ED Disposition    ED Disposition Condition [place]: [facility] [[id number]]  Level of Care: Stepdown [14]  Covid Evaluation: Asymptomatic Screening Protocol (No Symptoms)  Admission Type: Urgent [2]  Diagnosis: Atrial fibrillation with rapid ventricular response [facility]) [[id number]]  Admitting Physician: Kara Pacer  Attending Physician: Bethena Roys [phone]  Estimated length of stay: past midnight tomorrow  Certification:: I certify this patient will need inpatient services for at least 2 midnights       B Medical/Surgery History Past Medical History:  Diagnosis Date  . Acute diastolic heart failure ([place]) 03/28/2016  . Acute encephalopathy 03/28/2016  . Acute encephalopathy 03/28/2016  . Acute on chronic respiratory failure with hypoxia ([place]) 12/04/2016  . Arthritis   . Asthma   . Atrial fibrillation ([place])    on AC  . Benign neoplasm of ascending colon 05/05/2014  . BPH (benign prostatic hypertrophy)   . CARDIOVASCULAR STUDIES, ABNORMAL 04/27/2009   Qualifier: Diagnosis of  By: Percival Spanish, MD, Farrel Gordon    . Chronic anticoagulation  08/20/2012  . Chronic respiratory failure with hypoxia ([place]) 11/10/2017  . Colon polyps   . COPD mixed type ([place]) 03/28/2016   PFT 06/17/14- severe obstructive airways disease with slight response to bronchodilator. FVC 2.03/42%, FEV1 1.02/28%, ratio 0.5, TLC 82%, DLCO 59%  . Diabetes mellitus   . Diabetic neuropathy, type II diabetes mellitus ([place]) 09/25/2014  . Diabetic retinopathy ([place]) 05/28/2017  . 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 GERD (gastroesophageal reflux disease)   . HTN (hypertension)    x 3 years  . Hyperlipidemia   . Lung nodules 03/11/2013  . Mediastinal adenopathy 06/20/2013   CT  & PET 2/15   . Memory loss   . 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 NHL (non-Hodgkin's lymphoma) ([place])    nhl dx 9/11  . Obesity    exogenous  . Obstructive sleep apnea 05/25/2014   NPSG-  04/07/2014-severe obstructive sleep apnea-AHI 75.1 per hour. CPAP titrated to 16. He required supplemental oxygen at 2 L/ [employer] which he already wears for sleep at home. Weight 338 pounds   . Peripheral vascular insufficiency ([place]) 08/02/2017  . Personal history of colonic polyps 03/02/2009   Qualifier: Diagnosis of  By: Deatra Ina MD, Sandy Salaam   . Sepsis ([place]) 06/07/2015  . Severe obesity (BMI >= 40) ([employer]) 05/01/2013  . Shingles   . Sleep apnea    CPAP machine is broken  . Thrombocytopenia ([place]) 11/23/2016   Past Surgical History:  Procedure Laterality Date  . APPENDECTOMY    . COLONOSCOPY WITH PROPOFOL N/A  05/05/2014   Procedure: COLONOSCOPY WITH PROPOFOL;  Surgeon: Inda Castle, MD;  Location: [facility] ENDOSCOPY;  Service: Endoscopy;  Laterality: N/A;     A IV Location/Drains/Wounds Patient Lines/Drains/Airways Status    Active Line/Drains/Airways    Name Placement date Placement time Site Days   Peripheral IV 10/09/19 Left Antecubital 10/09/19  1742  Antecubital  less than 1   Peripheral IV 10/09/19 Right Hand 10/09/19  1940  Hand  less  than 1          Intake/Output Last 24 hours No intake or output data in the 24 hours ending 10/09/19 2025  Labs/Imaging Results for orders placed or performed during the hospital encounter of 10/09/19 (from the past 48 hour(s))  CBG monitoring, ED     Status: Abnormal   Collection Time: 10/09/19  4:51 PM  Result Value Ref Range   Glucose-Capillary >600 (HH) 70 - 99 mg/dL    Comment: Glucose reference range applies only to samples taken after fasting for at least 8 hours.   Comment 1 Notify RN    Comment 2 Document in Chart   Basic metabolic panel     Status: Abnormal   Collection Time: 10/09/19  5:19 PM  Result Value Ref Range   Sodium 131 (L) 135 - 145 mmol/L   Potassium 4.8 3.5 - 5.1 mmol/L   Chloride 92 (L) 98 - 111 mmol/L   CO2 28 22 - 32 mmol/L   Glucose, Bld 616 (HH) 70 - 99 mg/dL    Comment: Glucose reference range applies only to samples taken after fasting for at least 8 hours. CRITICAL RESULT CALLED TO, READ BACK BY AND VERIFIED WITH: Keylah Darwish,K ON 10/09/19 AT 1810 BY LOY,C    BUN 31 (H) 8 - 23 mg/dL   Creatinine, Ser 1.45 (H) 0.61 - 1.24 mg/dL   Calcium 9.6 8.9 - 10.3 mg/dL   GFR calc non Af Amer 49 (L) >60 mL/min   GFR calc Af Amer 57 (L) >60 mL/min   Anion gap 11 5 - 15    Comment: Performed at [facility], [address], [place], [place] [address]  Brain natriuretic peptide     Status: Abnormal   Collection Time: 10/09/19  5:19 PM  Result Value Ref Range   B Natriuretic Peptide 302.0 (H) 0.0 - 100.0 pg/mL    Comment: Performed at [facility], [address] [place], [place] [address]  CBC with Differential     Status: Abnormal   Collection Time: 10/09/19  5:19 PM  Result Value Ref Range   WBC 6.7 4.0 - 10.5 K/uL   RBC 4.32 4.22 - 5.81 MIL/uL   Hemoglobin 12.7 (L) 13.0 - 17.0 g/dL   HCT 39.0 39 - 52 %   MCV 90.3 80.0 - 100.0 fL   MCH 29.4 26.0 - 34.0 pg   MCHC 32.6 30.0 - 36.0 g/dL   RDW 14.5 11.5 - 15.5 %   Platelets 209 150 - 400 K/uL   nRBC 0.0 0.0  - 0.2 %   Neutrophils Relative % 80 %   Neutro Abs 5.4 1.7 - 7.7 K/uL   Lymphocytes Relative 12 %   Lymphs Abs 0.8 0.7 - 4.0 K/uL   Monocytes Relative 6 %   Monocytes Absolute 0.4 0 - 1 K/uL   Eosinophils Relative 1 %   Eosinophils Absolute 0.1 0 - 0 K/uL   Basophils Relative 1 %   Basophils Absolute 0.1 0 - 0 K/uL   Immature Granulocytes  0 %   Abs Immature Granulocytes 0.02 0.00 - 0.07 K/uL    Comment: Performed at [facility], [address] [place], [place] [address]  Troponin I (High Sensitivity)     Status: None   Collection Time: 10/09/19  5:19 PM  Result Value Ref Range   Troponin I (High Sensitivity) 10 <18 ng/L    Comment: (NOTE) Elevated high sensitivity troponin I (hsTnI) values and significant  changes across serial measurements may suggest ACS but many other  chronic and acute conditions are known to elevate hsTnI results.  Refer to the Links section for chest pain algorithms and additional  guidance. Performed at [facility], [address], [place], [place] [address]   POC CBG, ED     Status: Abnormal   Collection Time: 10/09/19  6:06 PM  Result Value Ref Range   Glucose-Capillary 579 (HH) 70 - 99 mg/dL    Comment: Glucose reference range applies only to samples taken after fasting for at least 8 hours.  Urinalysis, Routine w reflex microscopic     Status: Abnormal   Collection Time: 10/09/19  6:20 PM  Result Value Ref Range   Color, Urine STRAW (A) YELLOW   APPearance CLEAR CLEAR   Specific Gravity, Urine 1.025 1.005 - 1.030   pH 6.0 5.0 - 8.0   Glucose, UA >=500 (A) NEGATIVE mg/dL   Hgb urine dipstick SMALL (A) NEGATIVE   Bilirubin Urine NEGATIVE NEGATIVE   Ketones, ur NEGATIVE NEGATIVE mg/dL   Protein, ur NEGATIVE NEGATIVE mg/dL   Nitrite NEGATIVE NEGATIVE   Leukocytes,Ua NEGATIVE NEGATIVE   RBC / HPF 0-5 0 - 5 RBC/hpf   WBC, UA 0-5 0 - 5 WBC/hpf   Bacteria, UA NONE SEEN NONE SEEN    Comment: Performed at [facility], [address]  [place], [place] [address]  SARS Coronavirus 2 by RT PCR (hospital order, performed in [place] hospital lab) Nasopharyngeal Nasopharyngeal Swab     Status: None   Collection Time: 10/09/19  7:00 PM   Specimen: Nasopharyngeal Swab  Result Value Ref Range   SARS Coronavirus 2 NEGATIVE NEGATIVE    Comment: (NOTE) SARS-CoV-2 target nucleic acids are NOT DETECTED.  The SARS-CoV-2 RNA is generally detectable in upper and lower respiratory specimens during the acute phase of infection. The lowest concentration of SARS-CoV-2 viral copies this assay can detect is 250 copies / mL. A negative result does not preclude SARS-CoV-2 infection and should not be used as the sole basis for treatment or other patient management decisions.  A negative result may occur with improper specimen collection / handling, submission of specimen other than nasopharyngeal swab, presence of viral mutation(s) within the areas targeted by this assay, and inadequate number of viral copies (<250 copies / mL). A negative result must be combined with clinical observations, patient history, and epidemiological information.  Fact Sheet for Patients:   StrictlyIdeas.no  Fact Sheet for Healthcare Providers: BankingDealers.co.za  This test is not yet approved or  cleared by the [place] FDA and has been authorized for detection and/or diagnosis of SARS-CoV-2 by FDA under an Emergency Use Authorization (EUA).  This EUA will remain in effect (meaning this test can be used) for the duration of the COVID-19 declaration under Section 564(b)(1) of the Act, 21 U.S.C. section 360bbb-3(b)(1), unless the authorization is terminated or revoked sooner.  Performed at [facility], [address] [place], [place] [address]   POC CBG, ED     Status: Abnormal  Collection Time: 10/09/19  7:19 PM  Result Value Ref Range   Glucose-Capillary 172 (H) 70 - 99 mg/dL    Comment: Glucose  reference range applies only to samples taken after fasting for at least 8 hours.   Comment 1 Notify RN    Comment 2 Document in Chart   Troponin I (High Sensitivity)     Status: None   Collection Time: 10/09/19  7:38 PM  Result Value Ref Range   Troponin I (High Sensitivity) 14 <18 ng/L    Comment: (NOTE) Elevated high sensitivity troponin I (hsTnI) values and significant  changes across serial measurements may suggest ACS but many other  chronic and acute conditions are known to elevate hsTnI results.  Refer to the "Links" section for chest pain algorithms and additional  guidance. Performed at [facility], [address], [place], [place] [address]   Beta-hydroxybutyric acid     Status: None   Collection Time: 10/09/19  7:38 PM  Result Value Ref Range   Beta-Hydroxybutyric Acid 0.07 0.05 - 0.27 mmol/L    Comment: Performed at [facility], [address], [place], [place] [address]   DG Chest Portable 1 View  Result Date: 10/09/2019 CLINICAL DATA:  Worsening shortness of breath since yesterday. EXAM: PORTABLE CHEST 1 VIEW COMPARISON:  Radiographs 07/28/2019 and 07/08/2019.  CT 07/08/2019. FINDINGS: 1721 hours. The heart size and mediastinal contours are stable. Stable mild atelectasis or scarring at both lung bases. No edema, confluent airspace opacity, pneumothorax or significant pleural effusion. Stable mild degenerative changes within the spine. IMPRESSION: Stable chest. No acute cardiopulmonary process. Electronically Signed   By: Richardean Sale M.D.   On: 10/09/2019 17:38    Pending Labs Unresulted Labs (From admission, onward) Comment          Start     Ordered   10/09/19 1905  Beta-hydroxybutyric acid  (Hyperglycemic Hyperosmolar State (HHS))  Now then every 8 hours,   STAT      10/09/19 1904          Vitals/Pain Today's Vitals   10/09/19 1846 10/09/19 1931 10/09/19 1941 10/09/19 1958  BP:  102/65  101/61  Pulse: (!) 169 (!) 51  (!) 107  Resp: (!) 34 (!) 21  19   Temp:      TempSrc:      SpO2: 94% 92%  95%  Weight:      Height:      PainSc:   0-No pain     Isolation Precautions No active isolations  Medications Medications  diltiazem (CARDIZEM) 1 mg/mL load via infusion 20 mg (20 mg Intravenous Bolus from Bag 10/09/19 1926)    And  diltiazem (CARDIZEM) 125 mg in dextrose 5% 125 mL (1 mg/mL) infusion (5 mg/hr Intravenous New Bag/Given 10/09/19 1923)  insulin regular, human (MYXREDLIN) 100 units/ 100 mL infusion (has no administration in time range)  0.9 %  sodium chloride infusion ( Intravenous New Bag/Given 10/09/19 1951)  dextrose 5 %-0.45 % sodium chloride infusion (has no administration in time range)  dextrose 50 % solution 0-50 mL (has no administration in time range)  potassium chloride 10 mEq in 100 mL IVPB (has no administration in time range)  insulin aspart (novoLOG) injection 10 Units (10 Units Intravenous Given 10/09/19 1752)  insulin aspart (novoLOG) injection 10 Units (10 Units Intravenous Given 10/09/19 1835)  morphine 4 MG/ML injection 4 mg (4 mg Intravenous Given 10/09/19 1851)  ondansetron (ZOFRAN) injection 4 mg (4 mg Intravenous Given 10/09/19 1848)  Mobility walks Low fall risk   Focused Assessments    R Recommendations: See Admitting Provider Note  Report given to:   Additional Notes:

## 2019-10-09 NOTE — Telephone Encounter (Signed)
Per Mar-Margaret, pt is to go to the ED, [facility] nurse advised and she will call EMS

## 2019-10-09 NOTE — ED Notes (Addendum)
Notified Haviland MD pts. CBG was 172. Notified to hold the pts. Insulin drip.

## 2019-10-09 NOTE — ED Provider Notes (Signed)
[facility] Provider Note   CSN: [id number] Arrival date & time: 10/09/19  1643     History Chief Complaint  Patient presents with  . Shortness of Breath    Justin Ruiz is a 69 y.o. male.  Pt presents to the ED today with sob and elevated BS.  The pt said his sob started worsening yesterday.  He has COPD and CHF and wears 3L oxygen all the time.  His bs has also been high since at least last night.  He is not entirely sure how long.  He did see his pcp on 6/2 for uncontrolled DM.  His pcp d/c'd Ozempic and started him on Trulicity.  Pt said he's been taking his meds as directed.  Pt does have a hx of afib and is anticoagulated on Eliquis.    CHA2DS2/VAS Stroke Risk Points  Current as of 13 minutes ago     4 >= 2 Points: High Risk  1 - 1.99 Points: Medium Risk  0 Points: Low Risk    Last Change: N/A      Details    This score determines the patient's risk of having a stroke if the  patient has atrial fibrillation.       Points Metrics  1 Has Congestive Heart Failure:  Yes    Current as of 13 minutes ago  0 Has Vascular Disease:  No    Current as of 13 minutes ago  1 Has Hypertension:  Yes    Current as of 13 minutes ago  1 Age:  66    Current as of 13 minutes ago  1 Has Diabetes:  Yes    Current as of 13 minutes ago  0 Had Stroke:  No  Had TIA:  No  Had thromboembolism:  No    Current as of 13 minutes ago  0 Male:  No    Current as of 13 minutes ago                 Past Medical History:  Diagnosis Date  . Acute diastolic heart failure ([place]) 03/28/2016  . Acute encephalopathy 03/28/2016  . Acute encephalopathy 03/28/2016  . Acute on chronic respiratory failure with hypoxia ([place]) 12/04/2016  . Arthritis   . Asthma   . Atrial fibrillation ([place])    on AC  . Benign neoplasm of ascending colon 05/05/2014  . BPH (benign prostatic hypertrophy)   . CARDIOVASCULAR STUDIES, ABNORMAL 04/27/2009   Qualifier: Diagnosis of  By: Percival Spanish, MD,  Farrel Gordon    . Chronic anticoagulation 08/20/2012  . Chronic respiratory failure with hypoxia ([place]) 11/10/2017  . Colon polyps   . COPD mixed type ([place]) 03/28/2016   PFT 06/17/14- severe obstructive airways disease with slight response to bronchodilator. FVC 2.03/42%, FEV1 1.02/28%, ratio 0.5, TLC 82%, DLCO 59%  . Diabetes mellitus   . Diabetic neuropathy, type II diabetes mellitus ([place]) 09/25/2014  . Diabetic retinopathy ([place]) 05/28/2017  . 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 GERD (gastroesophageal reflux disease)   . HTN (hypertension)    x 3 years  . Hyperlipidemia   . Lung nodules 03/11/2013  . Mediastinal adenopathy 06/20/2013   CT  & PET 2/15   . Memory loss   . 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 NHL (non-Hodgkin's lymphoma) ([place])    nhl dx 9/11  . Obesity    exogenous  . Obstructive sleep apnea 05/25/2014  NPSG-  04/07/2014-severe obstructive sleep apnea-AHI 75.1 per hour. CPAP titrated to 16. He required supplemental oxygen at 2 L/ [employer] which he already wears for sleep at home. Weight 338 pounds   . Peripheral vascular insufficiency ([place]) 08/02/2017  . Personal history of colonic polyps 03/02/2009   Qualifier: Diagnosis of  By: Deatra Ina MD, Sandy Salaam   . Sepsis ([place]) 06/07/2015  . Severe obesity (BMI >= 40) ([place]) 05/01/2013  . Shingles   . Sleep apnea    CPAP machine is broken  . Thrombocytopenia ([place]) 11/23/2016    Patient Active Problem List   Diagnosis Date Noted  . Acute respiratory failure with hypoxia and hypercapnia ([place]) 07/29/2019  . Dyspnea 07/28/2019  . Acute pericardial effusion 07/08/2019  . Acute on chronic respiratory failure ([place]) 06/25/2019  . Acute on chronic diastolic CHF (congestive heart failure) ([place]) 06/25/2019  . Atrial fibrillation, chronic ([place]) 06/25/2019  . Uncontrolled type 2 diabetes mellitus with hyperglycemia ([place]) 06/25/2019  . General weakness   . Diabetic infection of  right foot ([place]) 12/17/2018  . Chronic respiratory failure with hypoxia ([place]) 11/10/2017  . Peripheral vascular insufficiency ([place]) 08/02/2017  . Diabetic retinopathy ([place]) 05/28/2017  . Thrombocytopenia ([place]) 11/23/2016  . Memory loss 04/13/2016  . Hypomagnesemia   . Chronic diastolic CHF (congestive heart failure) ([place]) 03/28/2016  . COPD GOLD III 03/28/2016  . Atrial fibrillation with rapid ventricular response ([place]) 03/28/2016  . 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 Chronic diastolic heart failure ([place])   . Diabetic neuropathy, type II diabetes mellitus ([place]) 09/25/2014  . Obstructive sleep apnea 05/25/2014  . Benign neoplasm of ascending colon 05/05/2014  . Excessive daytime sleepiness 01/24/2014  . Mediastinal adenopathy 06/20/2013  . Generalized anxiety disorder 05/01/2013  . Lung nodules 03/11/2013  . NHL (non-Hodgkin's lymphoma) ([place]) 01/26/2010  . Obesity, morbid ([place]) 04/27/2009  . ABNORMAL STRESS ELECTROCARDIOGRAM 03/25/2009  . Diabetes mellitus type 2, insulin dependent ([place]) 04/29/2007  . Hyperlipidemia 04/29/2007  . Hypertension 04/29/2007  . Seasonal and perennial allergic rhinitis 04/29/2007  . G E R D 04/29/2007  . BENIGN PROSTATIC HYPERTROPHY, HX OF 04/29/2007    Past Surgical History:  Procedure Laterality Date  . APPENDECTOMY    . COLONOSCOPY WITH PROPOFOL N/A 05/05/2014   Procedure: COLONOSCOPY WITH PROPOFOL;  Surgeon: Inda Castle, MD;  Location: [facility] ENDOSCOPY;  Service: Endoscopy;  Laterality: N/A;       Family History  Problem Relation Age of Onset  . Liver cancer Mother   . Diabetes Mother   . Heart disease Mother   . Colon cancer Father   . Prostate cancer Father   . Colon polyps Father   . Pulmonary embolism Sister   . Diabetes Sister   . Heart attack Sister   . Aortic aneurysm Sister   . COPD Sister   . Heart disease Sister   . COPD Sister   . Heart disease Sister   . Diabetes Maternal Grandmother      Social History   Tobacco Use  . Smoking status: Former Smoker    Packs/day: 1.00    Years: 40.00    Pack years: 40.00    Types: Cigarettes    Start date: 12/17/1968    Quit date: 10/18/2018    Years since quitting: 0.9  . Smokeless tobacco: Never Used  . Tobacco comment: 2 ppd; pt reports smoking 2-3 weeks ago, 5-6 cigs/daily  Vaping Use  . Vaping Use: Never used  Substance Use Topics  .  Alcohol use: No    Alcohol/week: 0.0 standard drinks    Comment: previous  . Drug use: No    Home Medications Prior to Admission medications   Medication Sig Start Date End Date Taking? Authorizing Provider  acetaminophen (TYLENOL) 325 MG tablet Take 650 mg by mouth every 6 (six) hours as needed.    [provider]  albuterol (VENTOLIN HFA) 108 (90 Base) MCG/ACT inhaler Inhale 2 puffs into the lungs every 4 (four) hours as needed for wheezing or shortness of breath. 07/30/19   Johnson, Clanford L, MD  ALPRAZolam (XANAX) 0.25 MG tablet Take 0.25 mg by mouth 3 (three) times daily as needed. 08/08/19   [provider]  bisoprolol (ZEBETA) 5 MG tablet Take 1 tablet (5 mg total) by mouth 2 (two) times daily. 09/09/19 10/09/19  [name], MD  budesonide (PULMICORT) 0.25 MG/2ML nebulizer solution Take 2 mLs (0.25 mg total) by nebulization 2 (two) times daily. 08/12/19   Martyn Ehrich, NP  Dulaglutide (TRULICITY) 4.5 UT/6.5YY SOPN Inject 4.5 mg as directed once a week. 09/25/19   [name], MD  ELIQUIS 5 MG TABS tablet Take 1 tablet by mouth twice daily 03/04/19   [name], MD  escitalopram (LEXAPRO) 20 MG tablet Take 1 tablet (20 mg total) by mouth daily. 08/08/19   [name], MD  furosemide (LASIX) 40 MG tablet Take 1 tablet (40 mg total) by mouth 2 (two) times daily. 08/08/19   [name], MD  insulin NPH Human (NOVOLIN N) 100 UNIT/ML injection Inject 0.28 mLs (28 Units total) into the skin 2 (two) times daily before a meal. Breakfast and supper 09/25/19   [name], MD  insulin regular (NOVOLIN R) 100 units/mL injection Inject 0.08 mLs (8 Units total) into the skin 3 (three) times daily before meals. 07/30/19   Johnson, Clanford L, MD  ipratropium-albuterol (DUONEB) 0.5-2.5 (3) MG/3ML SOLN USE 1 VIAL IN NEBULIZER EVERY 6 HOURS AS NEEDED 09/20/19   Baird Lyons D, MD  nystatin (MYCOSTATIN/NYSTOP) powder Apply topically 2 (two) times daily. Apply to groin area and scrotum skin; keep area clean and dry. 06/28/19   Barton Dubois, MD  oxymetazoline (AFRIN) 0.05 % nasal spray Place 1 spray into both nostrils 2 (two) times daily as needed for congestion.    [provider]  predniSONE (DELTASONE) 10 MG tablet Take 1 tablet (10 mg total) by mouth daily with breakfast. 08/08/19   [name], MD  simvastatin (ZOCOR) 40 MG tablet Take 1 tablet by mouth once daily 03/25/19   [name], MD  Tiotropium Bromide-Olodaterol (STIOLTO RESPIMAT) 2.5-2.5 MCG/ACT AERS Inhale 2 puffs into the lungs daily. #28 metered inhalations LOT 11/21 [id number] B sample    [provider]  Vitamin D, Cholecalciferol, 50 MCG (2000 UT) CAPS Take 1 capsule by mouth daily.    [provider]    Allergies    Avapro [irbesartan] and Lipitor [atorvastatin calcium]  Review of Systems   Review of Systems  Respiratory: Positive for shortness of breath.   All other systems reviewed and are negative.   Physical Exam Updated Vital Signs BP 119/84   Pulse (!) 169   Temp 98.7 F (37.1 C) (Oral)   Resp (!) 34   Ht 5\' 9"  (1.753 m)   Wt 128.4 kg   SpO2 94%   BMI 41.79 kg/m   Physical Exam Vitals and nursing note reviewed.  Constitutional:      Appearance: He is well-developed.  HENT:  Head: Normocephalic and atraumatic.     Mouth/Throat:     Mouth: Mucous membranes are moist.  Eyes:     Extraocular Movements: Extraocular movements intact.     Pupils: Pupils are equal, round, and reactive to light.  Cardiovascular:     Rate and Rhythm: Normal rate.  Rhythm irregular.  Pulmonary:     Effort: Pulmonary effort is normal.     Breath sounds: Normal breath sounds.  Abdominal:     General: Bowel sounds are normal.     Palpations: Abdomen is soft.  Musculoskeletal:        General: Normal range of motion.     Cervical back: Normal range of motion and neck supple.     Right lower leg: Edema present.     Left lower leg: Edema present.  Skin:    Capillary Refill: Capillary refill takes less than 2 seconds.  Neurological:     General: No focal deficit present.     Mental Status: He is alert and oriented to person, place, and time.  Psychiatric:        Mood and Affect: Mood normal.        Behavior: Behavior normal.     ED Results / Procedures / Treatments   Labs (all labs ordered are listed, but only abnormal results are displayed) Labs Reviewed  BASIC METABOLIC PANEL - Abnormal; Notable for the following components:      Result Value   Sodium 131 (*)    Chloride 92 (*)    Glucose, Bld 616 (*)    BUN 31 (*)    Creatinine, Ser 1.45 (*)    GFR calc non Af Amer 49 (*)    GFR calc Af Amer 57 (*)    All other components within normal limits  BRAIN NATRIURETIC PEPTIDE - Abnormal; Notable for the following components:   B Natriuretic Peptide 302.0 (*)    All other components within normal limits  CBC WITH DIFFERENTIAL/PLATELET - Abnormal; Notable for the following components:   Hemoglobin 12.7 (*)    All other components within normal limits  URINALYSIS, ROUTINE W REFLEX MICROSCOPIC - Abnormal; Notable for the following components:   Color, Urine STRAW (*)    Glucose, UA >=500 (*)    Hgb urine dipstick SMALL (*)    All other components within normal limits  CBG MONITORING, ED - Abnormal; Notable for the following components:   Glucose-Capillary >600 (*)    All other components within normal limits  CBG MONITORING, ED - Abnormal; Notable for the following components:   Glucose-Capillary 579 (*)    All other components within  normal limits  CBG MONITORING, ED - Abnormal; Notable for the following components:   Glucose-Capillary 172 (*)    All other components within normal limits  SARS CORONAVIRUS 2 BY RT PCR (HOSPITAL ORDER, [facility])  BETA-HYDROXYBUTYRIC ACID  BETA-HYDROXYBUTYRIC ACID  CBG MONITORING, ED  CBG MONITORING, ED  TROPONIN I (HIGH SENSITIVITY)  TROPONIN I (HIGH SENSITIVITY)    EKG EKG Interpretation  Date/Time:  Wednesday October 09 2019 17:07:46 EDT Ventricular Rate:  91 PR Interval:    QRS Duration: 62 QT Interval:  387 QTC Calculation: 477 R Axis:   79 Text Interpretation: Atrial fibrillation Ventricular premature complex Low voltage, precordial leads Borderline T abnormalities, diffuse leads Borderline prolonged QT interval No significant change since last tracing Confirmed by Isla Pence [phone]) on 10/09/2019 5:18:42 PM   Radiology DG Chest Portable 1 View  Result Date: 10/09/2019 CLINICAL DATA:  Worsening shortness of breath since yesterday. EXAM: PORTABLE CHEST 1 VIEW COMPARISON:  Radiographs 07/28/2019 and 07/08/2019.  CT 07/08/2019. FINDINGS: 1721 hours. The heart size and mediastinal contours are stable. Stable mild atelectasis or scarring at both lung bases. No edema, confluent airspace opacity, pneumothorax or significant pleural effusion. Stable mild degenerative changes within the spine. IMPRESSION: Stable chest. No acute cardiopulmonary process. Electronically Signed   By: Richardean Sale M.D.   On: 10/09/2019 17:38    Procedures Procedures (including critical care time)  Medications Ordered in ED Medications  diltiazem (CARDIZEM) 1 mg/mL load via infusion 20 mg (20 mg Intravenous Bolus from Bag 10/09/19 1926)    And  diltiazem (CARDIZEM) 125 mg in dextrose 5% 125 mL (1 mg/mL) infusion (5 mg/hr Intravenous New Bag/Given 10/09/19 1923)  insulin regular, human (MYXREDLIN) 100 units/ 100 mL infusion (has no administration in time range)  0.9 %   sodium chloride infusion (has no administration in time range)  dextrose 5 %-0.45 % sodium chloride infusion (has no administration in time range)  dextrose 50 % solution 0-50 mL (has no administration in time range)  potassium chloride 10 mEq in 100 mL IVPB (has no administration in time range)  insulin aspart (novoLOG) injection 10 Units (10 Units Intravenous Given 10/09/19 1752)  insulin aspart (novoLOG) injection 10 Units (10 Units Intravenous Given 10/09/19 1835)  morphine 4 MG/ML injection 4 mg (4 mg Intravenous Given 10/09/19 1851)  ondansetron (ZOFRAN) injection 4 mg (4 mg Intravenous Given 10/09/19 1848)    ED Course  I have reviewed the triage vital signs and the nursing notes.  Pertinent labs & imaging results that were available during my care of the patient were reviewed by me and considered in my medical decision making (see chart for details).    MDM Rules/Calculators/A&P                          Pt's blood sugars are high despite IV insulin boluses.  IV insulin drip and Endo tool started.  While here, pt developed afib with rvr.  He has permanent afib, so no use cardioverting.  Pt started on a cardizem bolus and infusion.  He's had 1 dose of his covid vaccine.  No fevers or other covid sx.  Covid swab pending.  Pt d/w Dr. Denton Brick for admission.  CRITICAL CARE Performed by: Isla Pence   Total critical care time: 30 minutes  Critical care time was exclusive of separately billable procedures and treating other patients.  Critical care was necessary to treat or prevent imminent or life-threatening deterioration.  Critical care was time spent personally by me on the following activities: development of treatment plan with patient and/or surrogate as well as nursing, discussions with consultants, evaluation of patient's response to treatment, examination of patient, obtaining history from patient or surrogate, ordering and performing treatments and interventions,  ordering and review of laboratory studies, ordering and review of radiographic studies, pulse oximetry and re-evaluation of patient's condition.  Final Clinical Impression(s) / ED Diagnoses Final diagnoses:  Atrial fibrillation with RVR ([place])  Hyperglycemia due to diabetes mellitus [facility])    Rx / DC Orders ED Discharge Orders    None       Isla Pence, MD 10/09/19 1935

## 2019-10-09 NOTE — H&P (Signed)
History and Physical    Justin Ruiz:[id number] DOB: 1951/01/05 DOA: 10/09/2019  PCP: [name], MD   Patient coming from: Home  I have personally briefly reviewed patient's old medical records in [place]  Chief Complaint: Elevated blood sugars  HPI: Justin Ruiz is a 69 y.o. male with medical history significant for COPD with chronic respiratory failure on 3 L , MGUS and NHL , diastolic CHF, hypertension, diabetes mellitus, atrial fibrillation. Patient presented to the ED with complaints of elevated blood sugars of greater than 600.  Triage note reports difficulty breathing that started yesterday, but at the time of my evaluation, patient denies any difficulty breathing, he is calm and does not appear to have any form of respiratory distress.  He is unaware of how many days his blood sugars have been elevated, but he tells me he has been compliant with his medications which include NPH 24 units twice daily, NovoLog he takes 8 units twice daily also, and Trulicity. He denies chest pain, no cough, no fever chills, no vomiting no loose stools, no pain with urination.  In the ED he was unaware of his fast heart rate.  Multiple recent hospitalizations , most recent 4/4-  4/6 for acute on chronic respiratory failure-deemed multifactorial, and severe hypoglycemia.  ED Course: Heart rate initially 120s, improved, then suddenly increased to 170s, blood pressure 101-140s, O2 sats > 95% on 3/4 L of oxygen.  Blood sugar 616, with normal bicarb of 28 and anion gap of 11, sodium of 131 .  UA not suggestive of infection.  Portable chest x-ray without acute abnormality. Sugar trended down to 244 after total of intravenous 20 units of NovoLog, , hence insulin drip was not started.   Patient was given 20 mg bolus of Cardizem and started on Cardizem drip with improvement in heart rate to 70s. [facility] was called to admit for atrial fibrillation with RVR  Review of Systems: As per HPI  all other systems reviewed and negative.  Past Medical History:  Diagnosis Date  . Acute diastolic heart failure ([place]) 03/28/2016  . Acute encephalopathy 03/28/2016  . Acute encephalopathy 03/28/2016  . Acute on chronic respiratory failure with hypoxia ([place]) 12/04/2016  . Arthritis   . Asthma   . Atrial fibrillation ([place])    on AC  . Benign neoplasm of ascending colon 05/05/2014  . BPH (benign prostatic hypertrophy)   . CARDIOVASCULAR STUDIES, ABNORMAL 04/27/2009   Qualifier: Diagnosis of  By: Percival Spanish, MD, Farrel Gordon    . Chronic anticoagulation 08/20/2012  . Chronic respiratory failure with hypoxia ([place]) 11/10/2017  . Colon polyps   . COPD mixed type ([place]) 03/28/2016   PFT 06/17/14- severe obstructive airways disease with slight response to bronchodilator. FVC 2.03/42%, FEV1 1.02/28%, ratio 0.5, TLC 82%, DLCO 59%  . Diabetes mellitus   . Diabetic neuropathy, type II diabetes mellitus ([place]) 09/25/2014  . Diabetic retinopathy ([place]) 05/28/2017  . 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 GERD (gastroesophageal reflux disease)   . HTN (hypertension)    x 3 years  . Hyperlipidemia   . Lung nodules 03/11/2013  . Mediastinal adenopathy 06/20/2013   CT  & PET 2/15   . Memory loss   . 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 NHL (non-Hodgkin's lymphoma) ([place])    nhl dx 9/11  . Obesity    exogenous  . Obstructive sleep apnea 05/25/2014   NPSG-  04/07/2014-severe obstructive sleep apnea-AHI  75.1 per hour. CPAP titrated to 16. He required supplemental oxygen at 2 L/ [employer] which he already wears for sleep at home. Weight 338 pounds   . Peripheral vascular insufficiency ([place]) 08/02/2017  . Personal history of colonic polyps 03/02/2009   Qualifier: Diagnosis of  By: Deatra Ina MD, Sandy Salaam   . Sepsis ([place]) 06/07/2015  . Severe obesity (BMI >= 40) ([place]) 05/01/2013  . Shingles   . Sleep apnea    CPAP machine is broken  . Thrombocytopenia ([place])  11/23/2016    Past Surgical History:  Procedure Laterality Date  . APPENDECTOMY    . COLONOSCOPY WITH PROPOFOL N/A 05/05/2014   Procedure: COLONOSCOPY WITH PROPOFOL;  Surgeon: Inda Castle, MD;  Location: [facility]COPY;  Service: Endoscopy;  Laterality: N/A;     reports that he quit smoking about a year ago. His smoking use included cigarettes. He started smoking about 50 years ago. He has a 40.00 pack-year smoking history. He has never used smokeless tobacco. He reports that he does not drink alcohol and does not use drugs.  Allergies  Allergen Reactions  . Avapro [Irbesartan] Other (See Comments)    "doesnt sit right with me"--light headed  . Lipitor [Atorvastatin Calcium] Other (See Comments)    Leg pain    Family History  Problem Relation Age of Onset  . Liver cancer Mother   . Diabetes Mother   . Heart disease Mother   . Colon cancer Father   . Prostate cancer Father   . Colon polyps Father   . Pulmonary embolism Sister   . Diabetes Sister   . Heart attack Sister   . Aortic aneurysm Sister   . COPD Sister   . Heart disease Sister   . COPD Sister   . Heart disease Sister   . Diabetes Maternal Grandmother     Prior to Admission medications   Medication Sig Start Date End Date Taking? Authorizing Provider  acetaminophen (TYLENOL) 325 MG tablet Take 650 mg by mouth every 6 (six) hours as needed.   Yes [provider]  albuterol (VENTOLIN HFA) 108 (90 Base) MCG/ACT inhaler Inhale 2 puffs into the lungs every 4 (four) hours as needed for wheezing or shortness of breath. 07/30/19  Yes Johnson, Clanford L, MD  ALPRAZolam (XANAX) 0.25 MG tablet Take 0.25 mg by mouth 3 (three) times daily as needed for anxiety or sleep.  08/08/19  Yes [provider]  bisoprolol (ZEBETA) 5 MG tablet Take 1 tablet (5 mg total) by mouth 2 (two) times daily. 09/09/19 10/09/19 Yes Stacks, [name], MD  budesonide (PULMICORT) 0.25 MG/2ML nebulizer solution Take 2 mLs (0.25 mg total) by  nebulization 2 (two) times daily. 08/12/19  Yes Martyn Ehrich, NP  COVID-19 mRNA vaccine, Moderna, 100 MCG/0.5ML SUSP Inject 0.5 mLs into the muscle once.   Yes [provider]  ELIQUIS 5 MG TABS tablet Take 1 tablet by mouth twice daily Patient taking differently: Take 5 mg by mouth 2 (two) times daily.  03/04/19  Yes Stacks, [name], MD  escitalopram (LEXAPRO) 20 MG tablet Take 1 tablet (20 mg total) by mouth daily. 08/08/19  Yes [name], MD  furosemide (LASIX) 40 MG tablet Take 1 tablet (40 mg total) by mouth 2 (two) times daily. 08/08/19  Yes Stacks, [name], MD  insulin NPH Human (NOVOLIN N) 100 UNIT/ML injection Inject 0.28 mLs (28 Units total) into the skin 2 (two) times daily before a meal. Breakfast and supper 09/25/19  Yes Stacks, [name],  MD  insulin regular (NOVOLIN R) 100 units/mL injection Inject 0.08 mLs (8 Units total) into the skin 3 (three) times daily before meals. 07/30/19  Yes Johnson, Clanford L, MD  ipratropium-albuterol (DUONEB) 0.5-2.5 (3) MG/3ML SOLN USE 1 VIAL IN NEBULIZER EVERY 6 HOURS AS NEEDED Patient taking differently: Inhale 3 mLs into the lungs every 4 (four) hours as needed (for shortness of breath/wheezing).  09/20/19  Yes Young, Tarri Fuller D, MD  oxymetazoline (AFRIN) 0.05 % nasal spray Place 1 spray into both nostrils 2 (two) times daily as needed for congestion.   Yes [provider]  Semaglutide,0.25 or 0.5MG /DOS, (OZEMPIC, 0.25 OR 0.5 MG/DOSE,) 2 MG/1.5ML SOPN Inject 1 mg into the skin every 7 (seven) days.   Yes [provider]  simvastatin (ZOCOR) 40 MG tablet Take 1 tablet by mouth once daily Patient taking differently: Take 40 mg by mouth at bedtime.  03/25/19  Yes Stacks, [name], MD  Tiotropium Bromide-Olodaterol (STIOLTO RESPIMAT) 2.5-2.5 MCG/ACT AERS Inhale 1 puff into the lungs daily.    Yes [provider]  Vitamin D, Cholecalciferol, 50 MCG (2000 UT) CAPS Take 1 capsule by mouth in the morning.    Yes [provider]  Dulaglutide (TRULICITY) 4.5 OE/4.2PN SOPN Inject 4.5 mg as directed once a week. Patient not taking: Reported on 10/09/2019 09/25/19   [name], MD  nystatin (MYCOSTATIN/NYSTOP) powder Apply topically 2 (two) times daily. Apply to groin area and scrotum skin; keep area clean and dry. Patient not taking: Reported on 10/09/2019 06/28/19   Barton Dubois, MD  predniSONE (DELTASONE) 10 MG tablet Take 1 tablet (10 mg total) by mouth daily with breakfast. Patient not taking: Reported on 10/09/2019 08/08/19   [name], MD    Physical Exam: Vitals:   10/09/19 1846 10/09/19 1931 10/09/19 1958 10/09/19 2032  BP:  102/65 101/61 110/76  Pulse: (!) 169 (!) 51 (!) 107 76  Resp: (!) 34 (!) 21 19 20   Temp:      TempSrc:      SpO2: 94% 92% 95% 96%  Weight:      Height:        Constitutional: NAD, calm, comfortable Vitals:   10/09/19 1846 10/09/19 1931 10/09/19 1958 10/09/19 2032  BP:  102/65 101/61 110/76  Pulse: (!) 169 (!) 51 (!) 107 76  Resp: (!) 34 (!) 21 19 20   Temp:      TempSrc:      SpO2: 94% 92% 95% 96%  Weight:      Height:       Eyes: PERRL, lids and conjunctivae normal ENMT: Mucous membranes are moist. Posterior pharynx clear of any exudate or lesions.  Neck: normal, supple, no masses, no thyromegaly Respiratory: clear to auscultation bilaterally, no wheezing, no crackles. Normal respiratory effort. No accessory muscle use.  Cardiovascular: irregular rate and rhythm, no murmurs / rubs / gallops. No extremity edema. 2+ pedal pulses.  Abdomen: full, no tenderness, no masses palpated. No hepatosplenomegaly. Bowel sounds positive.  Musculoskeletal: no clubbing / cyanosis. No joint deformity upper and lower extremities. Good ROM, no contractures. Normal muscle tone.  Skin: Multiple surface scratches to bilateral lower extremity, patient reports they are self-inflicted, no rashes, lesions, ulcers. No induration Neurologic: No apparent cranial nerve abnormality,  moving all extremities spontaneously. Psychiatric: Normal judgment and insight. Alert and oriented x 3. Normal mood.   Labs on Admission: I have personally reviewed following labs and imaging studies  CBC: Recent Labs  Lab 10/09/19 1719  WBC 6.7  NEUTROABS 5.4  HGB 12.7*  HCT 39.0  MCV 90.3  PLT 008   Basic Metabolic Panel: Recent Labs  Lab 10/09/19 1719  NA 131*  K 4.8  CL 92*  CO2 28  GLUCOSE 616*  BUN 31*  CREATININE 1.45*  CALCIUM 9.6   CBG: Recent Labs  Lab 10/09/19 1651 10/09/19 1806 10/09/19 1919 10/09/19 2042  GLUCAP >600* 579* 172* 244*   Urine analysis:    Component Value Date/Time   COLORURINE STRAW (A) 10/09/2019 1820   APPEARANCEUR CLEAR 10/09/2019 1820   APPEARANCEUR Clear 05/30/2018 1147   LABSPEC 1.025 10/09/2019 1820   PHURINE 6.0 10/09/2019 1820   GLUCOSEU >=500 (A) 10/09/2019 1820   HGBUR SMALL (A) 10/09/2019 1820   BILIRUBINUR NEGATIVE 10/09/2019 1820   BILIRUBINUR Negative 05/30/2018 1147   KETONESUR NEGATIVE 10/09/2019 1820   PROTEINUR NEGATIVE 10/09/2019 1820   UROBILINOGEN negative 05/01/2013 1115   UROBILINOGEN 0.2 05/18/2010 0352   NITRITE NEGATIVE 10/09/2019 1820   LEUKOCYTESUR NEGATIVE 10/09/2019 1820    Radiological Exams on Admission: DG Chest Portable 1 View  Result Date: 10/09/2019 CLINICAL DATA:  Worsening shortness of breath since yesterday. EXAM: PORTABLE CHEST 1 VIEW COMPARISON:  Radiographs 07/28/2019 and 07/08/2019.  CT 07/08/2019. FINDINGS: 1721 hours. The heart size and mediastinal contours are stable. Stable mild atelectasis or scarring at both lung bases. No edema, confluent airspace opacity, pneumothorax or significant pleural effusion. Stable mild degenerative changes within the spine. IMPRESSION: Stable chest. No acute cardiopulmonary process. Electronically Signed   By: Richardean Sale M.D.   On: 10/09/2019 17:38    EKG: Independently reviewed.  Atrial fibrillation rate 91.  Assessment/Plan Principal  Problem:   Atrial fibrillation with rapid ventricular response (HCC) Active Problems:   Diabetes mellitus type 2, insulin dependent (HCC)   Obesity, morbid ([place])   Hypertension   Generalized anxiety disorder   Obstructive sleep apnea   Chronic diastolic heart failure ([place])   Uncontrolled type 2 diabetes mellitus with hyperglycemia (HCC)  Atrial fibrillation with RVR- rates up to 170, likely provoked by hyperglycemia.  Asymptomatic.  Anticoagulated with Eliquis-reports compliance with medications.  Last echo 07/2019-EF 60 to 65%. -20 Milligrams bolus Cardizem given in ED, on Cardizem drip, wean off drip as able -Resume Home bisoprolol  Diabetes mellitus with hyperglycemia-blood glucose 616 >> 244, with 20 mg bolus IV NovoLog.  Normal serum bicarb 28, normal anion gap 11.  Recent hospitalization patient's NPH was temporarily discontinued due to severe hypoglycemia, this has been resumed. -Repeat BMP check electrolytes -Resume home NPH at reduced dose 15 units BID -Hold Trulicity/Ozempic - SSI- M   Pseudohyponatremia-sodium 131 >> 139 when corrected for hyperglycemia   Hypertension-stable. -Resume bisoprolol  -Resume Lasix in a.m.  Chronic diastolic CHF-stable and compensated, BNP stable at 302.  Last echo 07/2019-EF 60 to 65%. -Resume Lasix 40 mg twice daily tomorrow  COPD with chronic respiratory failure-stable.  On baseline 3 L of oxygen. - PRN Duonebs, resume home bronchodilators.  DVT prophylaxis: Eliquis Code Status: Full code Family Communication: None at bedside Disposition Plan: 1 to 2 days Consults called: None Admission status: Inpatient, stepdown I certify that at the point of admission it is my clinical judgment that the patient will require inpatient hospital care spanning beyond 2 midnights from the point of admission due to high intensity of service, high risk for further deterioration and high frequency of surveillance required. The following factors support the  patient status of inpatient: Requiring IV medications for control of heart rate  and hence stepdown level of care.   Bethena Roys MD [facility]  10/09/2019, 9:44 PM

## 2019-10-09 NOTE — Telephone Encounter (Signed)
BS too high for meter to read. Pt is not on sliding scale This morning it was 403 he took 8u this morning, then 8u at lunch time It has been running high in the mornings as high as 522 fasting

## 2019-10-09 NOTE — ED Triage Notes (Signed)
Pt c/o sob that started getting worse yesterday; pt states ems came to his house today and checked his cbg and the reading was over 600; pt denies any pain

## 2019-10-09 NOTE — ED Notes (Signed)
Date and time results received: 10/09/19  (use smartphrase ".now" to insert current time)  Test: CBG Critical Value: 616  Name of Provider Notified: Isla Pence MD  Orders Received? Or Actions Taken?: N/A

## 2019-10-10 ENCOUNTER — Telehealth: Payer: Self-pay

## 2019-10-10 ENCOUNTER — Telehealth: Payer: Self-pay | Admitting: *Deleted

## 2019-10-10 ENCOUNTER — Other Ambulatory Visit: Payer: Self-pay | Admitting: Pharmacy Technician

## 2019-10-10 DIAGNOSIS — I4891 Unspecified atrial fibrillation: Secondary | ICD-10-CM | POA: Diagnosis present

## 2019-10-10 DIAGNOSIS — J449 Chronic obstructive pulmonary disease, unspecified: Secondary | ICD-10-CM | POA: Diagnosis not present

## 2019-10-10 LAB — MRSA PCR SCREENING: MRSA by PCR: NEGATIVE

## 2019-10-10 LAB — GLUCOSE, CAPILLARY
Glucose-Capillary: 262 mg/dL — ABNORMAL HIGH (ref 70–99)
Glucose-Capillary: 340 mg/dL — ABNORMAL HIGH (ref 70–99)

## 2019-10-10 MED ORDER — CHLORHEXIDINE GLUCONATE CLOTH 2 % EX PADS
6.0000 | MEDICATED_PAD | Freq: Every day | CUTANEOUS | Status: DC
  Administered 2019-10-10: 6 via TOPICAL

## 2019-10-10 NOTE — Discharge Summary (Addendum)
Physician Discharge Summary  AFFAN CALLOW HKV:[id number] DOB: [birth year] DOA: 10/09/2019  PCP: [name], MD  Admit date: 10/09/2019  Discharge date: 10/10/2019  Admitted From:Home  Disposition:  Home  Recommendations for Outpatient Follow-up:  1. Follow up with PCP in 1-2 weeks 2. Follow-up with cardiologist Dr. Percival Spanish in 2 weeks 3. Encouraged compliance with home insulin regimen and avoidance of excessive carbs/starches 4. Consider endocrinologist follow-up in outpatient setting if poor control of blood glucose noted  Home Health: None  Equipment/Devices: Has home 3 L nasal cannula oxygen  Discharge Condition: Stable  CODE STATUS: Full  Diet recommendation: Heart Healthy/carb modified  Brief/Interim Summary: Per FIE:PPIRJJ B Justin Ruiz is a 69 y.o. male with medical history significant for COPD with chronic respiratory failure on 3 L , MGUS and NHL , diastolic CHF, hypertension, diabetes mellitus, atrial fibrillation. Patient presented to the ED with complaints of elevated blood sugars of greater than 600.  Triage note reports difficulty breathing that started yesterday, but at the time of my evaluation, patient denies any difficulty breathing, he is calm and does not appear to have any form of respiratory distress.  He is unaware of how many days his blood sugars have been elevated, but he tells me he has been compliant with his medications which include NPH 24 units twice daily, NovoLog he takes 8 units twice daily also, and Trulicity. He denies chest pain, no cough, no fever chills, no vomiting no loose stools, no pain with urination.  In the ED he was unaware of his fast heart rate.  Multiple recent hospitalizations , most recent 4/4-  4/6 for acute on chronic respiratory failure-deemed multifactorial, and severe hypoglycemia.  ED Course: Heart rate initially 120s, improved, then suddenly increased to 170s, blood pressure 101-140s, O2 sats > 95% on 3/4 L of oxygen.   Blood sugar 616, with normal bicarb of 28 and anion gap of 11, sodium of 131 .  UA not suggestive of infection.  Portable chest x-ray without acute abnormality. Sugar trended down to 244 after total of intravenous 20 units of NovoLog, , hence insulin drip was not started.   Patient was given 20 mg bolus of Cardizem and started on Cardizem drip with improvement in heart rate to 70s. Hospitlaist was called to admit for atrial fibrillation with RVR  -Patient was admitted with atrial fibrillation with RVR likely triggered by hyperglycemia.  This is in the setting of some insulin noncompliance as well as poor dietary control with recent attendance at barbecues where he ate excessive amounts of sweet potatoes.  Patient was given 20 units of IV insulin with significant improvement in blood glucose levels and is stable this morning and is tolerating diet.  Case was reviewed with diabetes coordinator who recommends continuation of his usual home insulin regimen and I have encouraged patient to remain compliant and consider follow-up with endocrinology in the outpatient setting.  He is no longer in RVR and remains rate controlled this morning and has been weaned off Cardizem drip.  He will continue his usual home bisoprolol and follow-up with cardiology as recommended.  No other acute events noted throughout this brief admission.  He is overall stable for discharge today.   Discharge Diagnoses:  Principal Problem:   Atrial fibrillation with rapid ventricular response (HCC) Active Problems:   Diabetes mellitus type 2, insulin dependent (HCC)   Obesity, morbid (HCC)   Hypertension   Generalized anxiety disorder   Obstructive sleep apnea   Chronic diastolic heart failure ([facility])  Uncontrolled type 2 diabetes mellitus with hyperglycemia ([place])  Principal discharge diagnosis: Atrial fibrillation with RVR likely provoked by hyperglycemia in the setting of insulin-dependent diabetes with likely transient  noncompliance with medication/diet.  Discharge Instructions  Discharge Instructions    Diet - low sodium heart healthy   Complete by: As directed    Increase activity slowly   Complete by: As directed      Allergies as of 10/10/2019      Reactions   Avapro [irbesartan] Other (See Comments)   "doesnt sit right with me"--light headed   Lipitor [atorvastatin Calcium] Other (See Comments)   Leg pain      Medication List    STOP taking these medications   predniSONE 10 MG tablet Commonly known as: DELTASONE     TAKE these medications   acetaminophen 325 MG tablet Commonly known as: TYLENOL Take 650 mg by mouth every 6 (six) hours as needed.   albuterol 108 (90 Base) MCG/ACT inhaler Commonly known as: VENTOLIN HFA Inhale 2 puffs into the lungs every 4 (four) hours as needed for wheezing or shortness of breath.   ALPRAZolam 0.25 MG tablet Commonly known as: XANAX Take 0.25 mg by mouth 3 (three) times daily as needed for anxiety or sleep.   bisoprolol 5 MG tablet Commonly known as: ZEBETA Take 1 tablet (5 mg total) by mouth 2 (two) times daily.   budesonide 0.25 MG/2ML nebulizer solution Commonly known as: PULMICORT Take 2 mLs (0.25 mg total) by nebulization 2 (two) times daily.   COVID-19 mRNA vaccine (Moderna) 100 MCG/0.5ML Susp Inject 0.5 mLs into the muscle once.   Eliquis 5 MG Tabs tablet Generic drug: apixaban Take 1 tablet by mouth twice daily What changed: how much to take   escitalopram 20 MG tablet Commonly known as: LEXAPRO Take 1 tablet (20 mg total) by mouth daily.   furosemide 40 MG tablet Commonly known as: LASIX Take 1 tablet (40 mg total) by mouth 2 (two) times daily.   insulin NPH Human 100 UNIT/ML injection Commonly known as: NovoLIN N Inject 0.28 mLs (28 Units total) into the skin 2 (two) times daily before a meal. Breakfast and supper   insulin regular 100 units/mL injection Commonly known as: NOVOLIN R Inject 0.08 mLs (8 Units total)  into the skin 3 (three) times daily before meals.   ipratropium-albuterol 0.5-2.5 (3) MG/3ML Soln Commonly known as: DUONEB USE 1 VIAL IN NEBULIZER EVERY 6 HOURS AS NEEDED What changed:   how much to take  how to take this  when to take this  reasons to take this  additional instructions   nystatin powder Commonly known as: MYCOSTATIN/NYSTOP Apply topically 2 (two) times daily. Apply to groin area and scrotum skin; keep area clean and dry.   oxymetazoline 0.05 % nasal spray Commonly known as: AFRIN Place 1 spray into both nostrils 2 (two) times daily as needed for congestion.   Ozempic (0.25 or 0.5 MG/DOSE) 2 MG/1.5ML Sopn Generic drug: Semaglutide(0.25 or 0.5MG /DOS) Inject 1 mg into the skin every 7 (seven) days.   simvastatin 40 MG tablet Commonly known as: ZOCOR Take 1 tablet by mouth once daily What changed: when to take this   Stiolto Respimat 2.5-2.5 MCG/ACT Aers Generic drug: Tiotropium Bromide-Olodaterol Inhale 1 puff into the lungs daily.   Trulicity 4.5 KC/1.2XN Sopn Generic drug: Dulaglutide Inject 4.5 mg as directed once a week.   Vitamin D (Cholecalciferol) 50 MCG (2000 UT) Caps Take 1 capsule by mouth in the morning.  Follow-up Information    [name], MD Follow up in 1 week(s).   Specialty: Family Medicine Contact information: [place] [address] [phone]        Minus Breeding, MD Follow up in 2 week(s).   Specialty: Cardiology Contact information: [address] [place] [address] [phone]              Allergies  Allergen Reactions  . Avapro [Irbesartan] Other (See Comments)    "doesnt sit right with me"--light headed  . Lipitor [Atorvastatin Calcium] Other (See Comments)    Leg pain    Consultations:  None   Procedures/Studies: DG Chest Portable 1 View  Result Date: 10/09/2019 CLINICAL DATA:  Worsening shortness of breath since yesterday. EXAM: PORTABLE CHEST 1 VIEW  COMPARISON:  Radiographs 07/28/2019 and 07/08/2019.  CT 07/08/2019. FINDINGS: 1721 hours. The heart size and mediastinal contours are stable. Stable mild atelectasis or scarring at both lung bases. No edema, confluent airspace opacity, pneumothorax or significant pleural effusion. Stable mild degenerative changes within the spine. IMPRESSION: Stable chest. No acute cardiopulmonary process. Electronically Signed   By: Richardean Sale M.D.   On: 10/09/2019 17:38   Discharge Exam: Vitals:   10/10/19 0900 10/10/19 1000  BP: 133/71 130/74  Pulse: 74 64  Resp:    Temp:    SpO2: 99% 97%   Vitals:   10/10/19 0746 10/10/19 0800 10/10/19 0900 10/10/19 1000  BP:  (!) 101/59 133/71 130/74  Pulse:  70 74 64  Resp:  13    Temp: (!) 97.2 F (36.2 C)     TempSrc: Oral     SpO2:  99% 99% 97%  Weight:      Height:        General: Pt is alert, awake, not in acute distress, obese Cardiovascular: Irregular, S1/S2 +, no rubs, no gallops Respiratory: CTA bilaterally, no wheezing, no rhonchi, currently on 3 L nasal cannula oxygen Abdominal: Soft, NT, ND, bowel sounds + Extremities: no edema, no cyanosis    The results of significant diagnostics from this hospitalization (including imaging, microbiology, ancillary and laboratory) are listed below for reference.     Microbiology: Recent Results (from the past 240 hour(s))  SARS Coronavirus 2 by RT PCR (hospital order, performed in [facility] hospital lab) Nasopharyngeal Nasopharyngeal Swab     Status: None   Collection Time: 10/09/19  7:00 PM   Specimen: Nasopharyngeal Swab  Result Value Ref Range Status   SARS Coronavirus 2 NEGATIVE NEGATIVE Final    Comment: (NOTE) SARS-CoV-2 target nucleic acids are NOT DETECTED.  The SARS-CoV-2 RNA is generally detectable in upper and lower respiratory specimens during the acute phase of infection. The lowest concentration of SARS-CoV-2 viral copies this assay can detect is 250 copies / mL. A negative  result does not preclude SARS-CoV-2 infection and should not be used as the sole basis for treatment or other patient management decisions.  A negative result may occur with improper specimen collection / handling, submission of specimen other than nasopharyngeal swab, presence of viral mutation(s) within the areas targeted by this assay, and inadequate number of viral copies (<250 copies / mL). A negative result must be combined with clinical observations, patient history, and epidemiological information.  Fact Sheet for Patients:   StrictlyIdeas.no  Fact Sheet for Healthcare Providers: BankingDealers.co.za  This test is not yet approved or  cleared by the [place] FDA and has been authorized for detection and/or diagnosis of SARS-CoV-2 by  FDA under an Emergency Use Authorization (EUA).  This EUA will remain in effect (meaning this test can be used) for the duration of the COVID-19 declaration under Section 564(b)(1) of the Act, 21 U.S.C. section 360bbb-3(b)(1), unless the authorization is terminated or revoked sooner.  Performed at [facility], [address] [place], [place] [address]   MRSA PCR Screening     Status: None   Collection Time: 10/09/19  9:30 PM   Specimen: Nasal Mucosa; Nasopharyngeal  Result Value Ref Range Status   MRSA by PCR NEGATIVE NEGATIVE Final    Comment:        The GeneXpert MRSA Assay (FDA approved for NASAL specimens only), is one component of a comprehensive MRSA colonization surveillance program. It is not intended to diagnose MRSA infection nor to guide or monitor treatment for MRSA infections. Performed at [facility], [address], [place], [place] [address]      Labs: BNP (last 3 results) Recent Labs    07/28/19 1227 08/08/19 1414 10/09/19 1719  BNP 243.0* 120.5* 144.8*   Basic Metabolic Panel: Recent Labs  Lab 10/09/19 1719 10/09/19 2158  NA 131* 136  K 4.8 4.5  CL 92*  97*  CO2 28 30  GLUCOSE 616* 298*  BUN 31* 30*  CREATININE 1.45* 1.29*  CALCIUM 9.6 9.8   Liver Function Tests: No results for input(s): AST, ALT, ALKPHOS, BILITOT, PROT, ALBUMIN in the last 168 hours. No results for input(s): LIPASE, AMYLASE in the last 168 hours. No results for input(s): AMMONIA in the last 168 hours. CBC: Recent Labs  Lab 10/09/19 1719  WBC 6.7  NEUTROABS 5.4  HGB 12.7*  HCT 39.0  MCV 90.3  PLT 209   Cardiac Enzymes: No results for input(s): CKTOTAL, CKMB, CKMBINDEX, TROPONINI in the last 168 hours. BNP: Invalid input(s): POCBNP CBG: Recent Labs  Lab 10/09/19 1651 10/09/19 1806 10/09/19 1919 10/09/19 2042 10/10/19 0733  GLUCAP >600* 579* 172* 244* 262*   D-Dimer No results for input(s): DDIMER in the last 72 hours. Hgb A1c No results for input(s): HGBA1C in the last 72 hours. Lipid Profile No results for input(s): CHOL, HDL, LDLCALC, TRIG, CHOLHDL, LDLDIRECT in the last 72 hours. Thyroid function studies No results for input(s): TSH, T4TOTAL, T3FREE, THYROIDAB in the last 72 hours.  Invalid input(s): FREET3 Anemia work up No results for input(s): VITAMINB12, FOLATE, FERRITIN, TIBC, IRON, RETICCTPCT in the last 72 hours. Urinalysis    Component Value Date/Time   COLORURINE STRAW (A) 10/09/2019 1820   APPEARANCEUR CLEAR 10/09/2019 1820   APPEARANCEUR Clear 05/30/2018 1147   LABSPEC 1.025 10/09/2019 1820   PHURINE 6.0 10/09/2019 1820   GLUCOSEU >=500 (A) 10/09/2019 1820   HGBUR SMALL (A) 10/09/2019 1820   BILIRUBINUR NEGATIVE 10/09/2019 1820   BILIRUBINUR Negative 05/30/2018 1147   KETONESUR NEGATIVE 10/09/2019 1820   PROTEINUR NEGATIVE 10/09/2019 1820   UROBILINOGEN negative 05/01/2013 1115   UROBILINOGEN 0.2 05/18/2010 0352   NITRITE NEGATIVE 10/09/2019 1820   LEUKOCYTESUR NEGATIVE 10/09/2019 1820   Sepsis Labs Invalid input(s): PROCALCITONIN,  WBC,  LACTICIDVEN Microbiology Recent Results (from the past 240 hour(s))  SARS  Coronavirus 2 by RT PCR (hospital order, performed in [place] hospital lab) Nasopharyngeal Nasopharyngeal Swab     Status: None   Collection Time: 10/09/19  7:00 PM   Specimen: Nasopharyngeal Swab  Result Value Ref Range Status   SARS Coronavirus 2 NEGATIVE NEGATIVE Final    Comment: (NOTE) SARS-CoV-2 target nucleic acids are NOT DETECTED.  The SARS-CoV-2 RNA is generally detectable in upper and lower respiratory specimens during the acute phase of infection. The lowest concentration of SARS-CoV-2 viral copies this assay can detect is 250 copies / mL. A negative result does not preclude SARS-CoV-2 infection and should not be used as the sole basis for treatment or other patient management decisions.  A negative result may occur with improper specimen collection / handling, submission of specimen other than nasopharyngeal swab, presence of viral mutation(s) within the areas targeted by this assay, and inadequate number of viral copies (<250 copies / mL). A negative result must be combined with clinical observations, patient history, and epidemiological information.  Fact Sheet for Patients:   StrictlyIdeas.no  Fact Sheet for Healthcare Providers: BankingDealers.co.za  This test is not yet approved or  cleared by the [place] FDA and has been authorized for detection and/or diagnosis of SARS-CoV-2 by FDA under an Emergency Use Authorization (EUA).  This EUA will remain in effect (meaning this test can be used) for the duration of the COVID-19 declaration under Section 564(b)(1) of the Act, 21 U.S.C. section 360bbb-3(b)(1), unless the authorization is terminated or revoked sooner.  Performed at [facility], [address] [place], [place] [address]   MRSA PCR Screening     Status: None   Collection Time: 10/09/19  9:30 PM   Specimen: Nasal Mucosa; Nasopharyngeal  Result Value Ref Range Status   MRSA by PCR NEGATIVE NEGATIVE  Final    Comment:        The GeneXpert MRSA Assay (FDA approved for NASAL specimens only), is one component of a comprehensive MRSA colonization surveillance program. It is not intended to diagnose MRSA infection nor to guide or monitor treatment for MRSA infections. Performed at [facility], [address] [place], [place] [address]      Time coordinating discharge: 35 minutes  SIGNED:   Rodena Goldmann, DO [facility] 10/10/2019, 10:58 AM  If 7PM-7AM, please contact night-coverage [email or web address]

## 2019-10-10 NOTE — Progress Notes (Signed)
Pt discharge education and instructions completed with pt and spouse at bedside. Both voices understanding and denies any questions. Pt IV and telemetry removed. Pt discharge home with spouse to transport him home. Pt transported off unit via wheelchair with belongings and spouse to the side. Pt remains on his 3l oxygen. Delia Heady RN

## 2019-10-10 NOTE — Care Management Obs Status (Deleted)
[facility] NOTIFICATION   Patient Details  Name: Justin Ruiz MRN: [id number] Date of Birth: 05-18-1950   Medicare Observation Status Notification Given:  Yes    Shade Flood, LCSW 10/10/2019, 12:05 PM

## 2019-10-10 NOTE — Care Management CC44 (Signed)
Condition Code 44 Documentation Completed  Patient Details  Name: Justin Ruiz MRN: [id number] Date of Birth: 1950-08-12   Condition Code 44 given:  Yes Patient signature on Condition Code 44 notice:  Yes Documentation of 2 MD's agreement:  Yes Code 44 added to claim:  Yes    Shade Flood, LCSW 10/10/2019, 12:04 PM

## 2019-10-10 NOTE — Telephone Encounter (Signed)
TRANSITIONAL CARE MANAGEMENT TELEPHONE OUTREACH NOTE   Contact Date: 10/10/2019 Contacted By: Eston Mould, LPN   DISCHARGE INFORMATION Date of Discharge:10/10/2019 Discharge Facility: [facility] Principal Discharge Diagnosis:Atrial fibrillation with RVR likely provoked by hyperglycemia in the setting of insulin-dependent diabetes with likely transient noncompliance with medication/diet.  Outpatient Follow Up Recommendations (copied from discharge summary) 1. Follow up with PCP in 1-2 weeks 2. Follow-up with cardiologist Dr. Percival Spanish in 2 weeks 3. Encouraged compliance with home insulin regimen and avoidance of excessive carbs/starches 4. Consider endocrinologist follow-up in outpatient setting if poor control of blood glucose noted  Justin Ruiz is a male primary care patient of Stacks, [name], MD. An outgoing telephone call was made today and I spoke with his wife Justin Ruiz.  Justin Ruiz condition(s) and treatment(s) were discussed. An opportunity to ask questions was provided and all were answered or forwarded as appropriate.    ACTIVITIES OF DAILY LIVING  Justin Ruiz lives with their spouse and he can perform ADLs independently. his primary caregiver is himself and his wife. he is able to depend on his primary caregiver(s) for consistent help. Transportation to appointments, to pick up medications, and to run errands is not a problem.  (Consider referral to [facility] if transportation or a consistent caregiver is a problem)   Fall Risk Fall Risk  09/25/2019 08/27/2019  Falls in the past year? 0 0  Number falls in past yr: 0 0  Injury with Fall? 0 0  Comment - -  Risk Factor Category  - -  Risk for fall due to : Impaired balance/gait;Impaired mobility History of fall(s);Impaired balance/gait;Impaired mobility  Risk for fall due to: Comment - -  Follow up Falls evaluation completed Falls evaluation completed    high [address] Modifications/Assistive  Devices Wheelchair: No Cane: No Ramp: No Bedside Toilet: No Hospital Bed:  No Other: [place] he is receiving home health Nursing services.  He has had [facility] prior to admission in the hospital   MEDICATION RECONCILIATION  Justin Ruiz has been able to pick-up all prescribed discharge medications from the pharmacy.   A post discharge medication reconciliation was performed and the complete medication list was reviewed with the patient/caregiver and is current as of 10/10/2019. Changes highlighted below.  Discontinued Medications n/a  Current Medication List Allergies as of 10/10/2019      Reactions   Avapro [irbesartan] Other (See Comments)   "doesnt sit right with me"--light headed   Lipitor [atorvastatin Calcium] Other (See Comments)   Leg pain      Medication List       Accurate as of October 10, 2019  3:16 PM. If you have any questions, ask your nurse or doctor.        acetaminophen 325 MG tablet Commonly known as: TYLENOL Take 650 mg by mouth every 6 (six) hours as needed.   albuterol 108 (90 Base) MCG/ACT inhaler Commonly known as: VENTOLIN HFA Inhale 2 puffs into the lungs every 4 (four) hours as needed for wheezing or shortness of breath.   ALPRAZolam 0.25 MG tablet Commonly known as: XANAX Take 0.25 mg by mouth 3 (three) times daily as needed for anxiety or sleep.   bisoprolol 5 MG tablet Commonly known as: ZEBETA Take 1 tablet (5 mg total) by mouth 2 (two) times daily.   budesonide 0.25 MG/2ML nebulizer solution Commonly known as: PULMICORT Take 2 mLs (0.25 mg total) by nebulization 2 (  two) times daily.   COVID-19 mRNA vaccine (Moderna) 100 MCG/0.5ML Susp Inject 0.5 mLs into the muscle once.   Eliquis 5 MG Tabs tablet Generic drug: apixaban Take 1 tablet by mouth twice daily What changed: how much to take   escitalopram 20 MG tablet Commonly known as: LEXAPRO Take 1 tablet (20 mg total) by mouth daily.   furosemide 40 MG  tablet Commonly known as: LASIX Take 1 tablet (40 mg total) by mouth 2 (two) times daily.   insulin NPH Human 100 UNIT/ML injection Commonly known as: NovoLIN N Inject 0.28 mLs (28 Units total) into the skin 2 (two) times daily before a meal. Breakfast and supper   insulin regular 100 units/mL injection Commonly known as: NOVOLIN R Inject 0.08 mLs (8 Units total) into the skin 3 (three) times daily before meals.   ipratropium-albuterol 0.5-2.5 (3) MG/3ML Soln Commonly known as: DUONEB USE 1 VIAL IN NEBULIZER EVERY 6 HOURS AS NEEDED What changed:   how much to take  how to take this  when to take this  reasons to take this  additional instructions   nystatin powder Commonly known as: MYCOSTATIN/NYSTOP Apply topically 2 (two) times daily. Apply to groin area and scrotum skin; keep area clean and dry.   oxymetazoline 0.05 % nasal spray Commonly known as: AFRIN Place 1 spray into both nostrils 2 (two) times daily as needed for congestion.   Ozempic (0.25 or 0.5 MG/DOSE) 2 MG/1.5ML Sopn Generic drug: Semaglutide(0.25 or 0.5MG /DOS) Inject 1 mg into the skin every 7 (seven) days.   simvastatin 40 MG tablet Commonly known as: ZOCOR Take 1 tablet by mouth once daily What changed: when to take this   Stiolto Respimat 2.5-2.5 MCG/ACT Aers Generic drug: Tiotropium Bromide-Olodaterol Inhale 1 puff into the lungs daily.   Trulicity 4.5 PH/1.5AV Sopn Generic drug: Dulaglutide Inject 4.5 mg as directed once a week.   Vitamin D (Cholecalciferol) 50 MCG (2000 UT) Caps Take 1 capsule by mouth in the morning.        PATIENT EDUCATION & FOLLOW-UP PLAN  An appointment for Transitional Care Management is scheduled with Justin Fraise, MD on 10/16/19  at 3:10.  Take all medications as prescribed  Contact our office by calling [phone] if you have any questions or concerns  Call Dr Percival Spanish to schedule your 2 week follow up

## 2019-10-10 NOTE — Patient Outreach (Signed)
[facility]) Care Management  10/10/2019  DOYLE KUNATH Jan 27, 1951 [id number]  Care coordination call placed to [employer] in regards to [place] application.  Spoke to [place] who informed patient was APPROVED 10/03/2019-04/24/2020. She informed patient would receive a 3 months supply delivered to his home in the next 10-14 business days.  Will follow up with patient in 10-15 business days to confirm medication was received.  Sheyli Horwitz P. Tamyia Minich, [place]  [phone]

## 2019-10-10 NOTE — Telephone Encounter (Signed)
Home health nurse called to give update on patient. He refused to let EMS take him to the hospital but his wife took him late yesterday evening. He was admitted [facility]

## 2019-10-10 NOTE — Discharge Instructions (Signed)
Atrial Fibrillation  Atrial fibrillation is a type of heartbeat that is irregular or fast. If you have this condition, your heart beats without any order. This makes it hard for your heart to pump blood in a normal way. Atrial fibrillation may come and go, or it may become a long-lasting problem. If this condition is not treated, it can put you at higher risk for stroke, heart failure, and other heart problems. What are the causes? This condition may be caused by diseases that damage the heart. They include:  High blood pressure.  Heart failure.  Heart valve disease.  Heart surgery. Other causes include:  Diabetes.  Thyroid disease.  Being overweight.  Kidney disease. Sometimes the cause is not known. What increases the risk? You are more likely to develop this condition if:  You are older.  You smoke.  You exercise often and very hard.  You have a family history of this condition.  You are a man.  You use drugs.  You drink a lot of alcohol.  You have lung conditions, such as emphysema, pneumonia, or COPD.  You have sleep apnea. What are the signs or symptoms? Common symptoms of this condition include:  A feeling that your heart is beating very fast.  Chest pain or discomfort.  Feeling short of breath.  Suddenly feeling light-headed or weak.  Getting tired easily during activity.  Fainting.  Sweating. In some cases, there are no symptoms. How is this treated? Treatment for this condition depends on underlying conditions and how you feel when you have atrial fibrillation. They include:  Medicines to: ? Prevent blood clots. ? Treat heart rate or heart rhythm problems.  Using devices, such as a pacemaker, to correct heart rhythm problems.  Doing surgery to remove the part of the heart that sends bad signals.  Closing an area where clots can form in the heart (left atrial appendage). In some cases, your doctor will treat other underlying  conditions. Follow these instructions at home: Medicines  Take over-the-counter and prescription medicines only as told by your doctor.  Do not take any new medicines without first talking to your doctor.  If you are taking blood thinners: ? Talk with your doctor before you take any medicines that have aspirin or NSAIDs, such as ibuprofen, in them. ? Take your medicine exactly as told by your doctor. Take it at the same time each day. ? Avoid activities that could hurt or bruise you. Follow instructions about how to prevent falls. ? Wear a bracelet that says you are taking blood thinners. Or, carry a card that lists what medicines you take. Lifestyle      Do not use any products that have nicotine or tobacco in them. These include cigarettes, e-cigarettes, and chewing tobacco. If you need help quitting, ask your doctor.  Eat heart-healthy foods. Talk with your doctor about the right eating plan for you.  Exercise regularly as told by your doctor.  Do not drink alcohol.  Lose weight if you are overweight.  Do not use drugs, including cannabis. General instructions  If you have a condition that causes breathing to stop for a short period of time (apnea), treat it as told by your doctor.  Keep a healthy weight. Do not use diet pills unless your doctor says they are safe for you. Diet pills may make heart problems worse.  Keep all follow-up visits as told by your doctor. This is important. Contact a doctor if:  You notice a change   in the speed, rhythm, or strength of your heartbeat.  You are taking a blood-thinning medicine and you get more bruising.  You get tired more easily when you move or exercise.  You have a sudden change in weight. Get help right away if:   You have pain in your chest or your belly (abdomen).  You have trouble breathing.  You have side effects of blood thinners, such as blood in your vomit, poop (stool), or pee (urine), or bleeding that cannot  stop.  You have any signs of a stroke. "BE FAST" is an easy way to remember the main warning signs: ? B - Balance. Signs are dizziness, sudden trouble walking, or loss of balance. ? E - Eyes. Signs are trouble seeing or a change in how you see. ? F - Face. Signs are sudden weakness or loss of feeling in the face, or the face or eyelid drooping on one side. ? A - Arms. Signs are weakness or loss of feeling in an arm. This happens suddenly and usually on one side of the body. ? S - Speech. Signs are sudden trouble speaking, slurred speech, or trouble understanding what people say. ? T - Time. Time to call emergency services. Write down what time symptoms started.  You have other signs of a stroke, such as: ? A sudden, very bad headache with no known cause. ? Feeling like you may vomit (nausea). ? Vomiting. ? A seizure. These symptoms may be an emergency. Do not wait to see if the symptoms will go away. Get medical help right away. Call your local emergency services (911 in the [place] Do not drive yourself to the hospital. Summary  Atrial fibrillation is a type of heartbeat that is irregular or fast.  You are at higher risk of this condition if you smoke, are older, have diabetes, or are overweight.  Follow your doctor's instructions about medicines, diet, exercise, and follow-up visits.  Get help right away if you have signs or symptoms of a stroke.  Get help right away if you cannot catch your breath, or you have chest pain or discomfort. This information is not intended to replace advice given to you by your health care provider. Make sure you discuss any questions you have with your health care provider. Document Revised: 10/03/2018 Document Reviewed: 10/03/2018 Elsevier Patient Education  [place].   Hyperglycemia Hyperglycemia occurs when the level of sugar (glucose) in the blood is too high. Glucose is a type of sugar that provides the body's main source of energy.  Certain hormones (insulin and glucagon) control the level of glucose in the blood. Insulin lowers blood glucose, and glucagon increases blood glucose. Hyperglycemia can result from having too little insulin in the bloodstream, or from the body not responding normally to insulin. Hyperglycemia occurs most often in people who have diabetes (diabetes mellitus), but it can happen in people who do not have diabetes. It can develop quickly, and it can be life-threatening if it causes you to become severely dehydrated (diabetic ketoacidosis or hyperglycemic hyperosmolar state). Severe hyperglycemia is a medical emergency. What are the causes? If you have diabetes, hyperglycemia may be caused by:  Diabetes medicine.  Medicines that increase blood glucose or affect your diabetes control.  Not eating enough, or not eating often enough.  Changes in physical activity level.  Being sick or having an infection. If you have prediabetes or undiagnosed diabetes:  Hyperglycemia may be caused by those conditions. If you do not have  diabetes, hyperglycemia may be caused by:  Certain medicines, including steroid medicines, beta-blockers, epinephrine, and thiazide diuretics.  Stress.  Serious illness.  Surgery.  Diseases of the pancreas.  Infection. What increases the risk? Hyperglycemia is more likely to develop in people who have risk factors for diabetes, such as:  Having a family member with diabetes.  Having a gene for type 1 diabetes that is passed from parent to child (inherited).  Living in an area with cold weather conditions.  Exposure to certain viruses.  Certain conditions in which the body's disease-fighting (immune) system attacks itself (autoimmune disorders).  Being overweight or obese.  Having an inactive (sedentary) lifestyle.  Having been diagnosed with insulin resistance.  Having a history of prediabetes, gestational diabetes, or polycystic ovarian syndrome  (PCOS).  Being of American-Indian, African-American, Hispanic/Latino, or Asian/Pacific Islander descent. What are the signs or symptoms? Hyperglycemia may not cause any symptoms. If you do have symptoms, they may include early warning signs, such as:  Increased thirst.  Hunger.  Feeling very tired.  Needing to urinate more often than usual.  Blurry vision. Other symptoms may develop if hyperglycemia gets worse, such as:  Dry mouth.  Loss of appetite.  Fruity-smelling breath.  Weakness.  Unexpected or rapid weight gain or weight loss.  Tingling or numbness in the hands or feet.  Headache.  Skin that does not quickly return to normal after being lightly pinched and released (poor skin turgor).  Abdominal pain.  Cuts or bruises that are slow to heal. How is this diagnosed? Hyperglycemia is diagnosed with a blood test to measure your blood glucose level. This blood test is usually done while you are having symptoms. Your health care provider may also do a physical exam and review your medical history. You may have more tests to determine the cause of your hyperglycemia, such as:  A fasting blood glucose (FBG) test. You will not be allowed to eat (you will fast) for at least 8 hours before a blood sample is taken.  An A1c (hemoglobin A1c) blood test. This provides information about blood glucose control over the previous 2-3 months.  An oral glucose tolerance test (OGTT). This measures your blood glucose at two times: ? After fasting. This is your baseline blood glucose level. ? Two hours after drinking a beverage that contains glucose. How is this treated? Treatment depends on the cause of your hyperglycemia. Treatment may include:  Taking medicine to regulate your blood glucose levels. If you take insulin or other diabetes medicines, your medicine or dosage may be adjusted.  Lifestyle changes, such as exercising more, eating healthier foods, or losing  weight.  Treating an illness or infection, if this caused your hyperglycemia.  Checking your blood glucose more often.  Stopping or reducing steroid medicines, if these caused your hyperglycemia. If your hyperglycemia becomes severe and it results in hyperglycemic hyperosmolar state, you must be hospitalized and given IV fluids. Follow these instructions at home:  General instructions  Take over-the-counter and prescription medicines only as told by your health care provider.  Do not use any products that contain nicotine or tobacco, such as cigarettes and e-cigarettes. If you need help quitting, ask your health care provider.  Limit alcohol intake to no more than 1 drink per day for nonpregnant women and 2 drinks per day for men. One drink equals 12 oz of beer, 5 oz of wine, or 1 oz of hard liquor.  Learn to manage stress. If you need help with this,  ask your health care provider.  Keep all follow-up visits as told by your health care provider. This is important. Eating and drinking   Maintain a healthy weight.  Exercise regularly, as directed by your health care provider.  Stay hydrated, especially when you exercise, get sick, or spend time in hot temperatures.  Eat healthy foods, such as: ? Lean proteins. ? Complex carbohydrates. ? Fresh fruits and vegetables. ? Low-fat dairy products. ? Healthy fats.  Drink enough fluid to keep your urine clear or pale yellow. If you have diabetes:  Make sure you know the symptoms of hyperglycemia.  Follow your diabetes management plan, as told by your health care provider. Make sure you: ? Take your insulin and medicines as directed. ? Follow your exercise plan. ? Follow your meal plan. Eat on time, and do not skip meals. ? Check your blood glucose as often as directed. Make sure to check your blood glucose before and after exercise. If you exercise longer or in a different way than usual, check your blood glucose more  often. ? Follow your sick day plan whenever you cannot eat or drink normally. Make this plan in advance with your health care provider.  Share your diabetes management plan with people in your workplace, school, and household.  Check your urine for ketones when you are ill and as told by your health care provider.  Carry a medical alert card or wear medical alert jewelry. Contact a health care provider if:  Your blood glucose is at or above 240 mg/dL (13.3 mmol/L) for 2 days in a row.  You have problems keeping your blood glucose in your target range.  You have frequent episodes of hyperglycemia. Get help right away if:  You have difficulty breathing.  You have a change in how you think, feel, or act (mental status).  You have nausea or vomiting that does not go away. These symptoms may represent a serious problem that is an emergency. Do not wait to see if the symptoms will go away. Get medical help right away. Call your local emergency services (911 in the [place] Do not drive yourself to the hospital. Summary  Hyperglycemia occurs when the level of sugar (glucose) in the blood is too high.  Hyperglycemia is diagnosed with a blood test to measure your blood glucose level. This blood test is usually done while you are having symptoms. Your health care provider may also do a physical exam and review your medical history.  If you have diabetes, follow your diabetes management plan as told by your health care provider.  Contact your health care provider if you have problems keeping your blood glucose in your target range. This information is not intended to replace advice given to you by your health care provider. Make sure you discuss any questions you have with your health care provider. Document Revised: 12/28/2015 Document Reviewed: 12/28/2015 Elsevier Patient Education  [place].   Hyperglycemia Hyperglycemia occurs when the level of sugar (glucose) in the blood is  too high. Glucose is a type of sugar that provides the body's main source of energy. Certain hormones (insulin and glucagon) control the level of glucose in the blood. Insulin lowers blood glucose, and glucagon increases blood glucose. Hyperglycemia can result from having too little insulin in the bloodstream, or from the body not responding normally to insulin. Hyperglycemia occurs most often in people who have diabetes (diabetes mellitus), but it can happen in people who do not have diabetes. It  can develop quickly, and it can be life-threatening if it causes you to become severely dehydrated (diabetic ketoacidosis or hyperglycemic hyperosmolar state). Severe hyperglycemia is a medical emergency. What are the causes? If you have diabetes, hyperglycemia may be caused by:  Diabetes medicine.  Medicines that increase blood glucose or affect your diabetes control.  Not eating enough, or not eating often enough.  Changes in physical activity level.  Being sick or having an infection. If you have prediabetes or undiagnosed diabetes:  Hyperglycemia may be caused by those conditions. If you do not have diabetes, hyperglycemia may be caused by:  Certain medicines, including steroid medicines, beta-blockers, epinephrine, and thiazide diuretics.  Stress.  Serious illness.  Surgery.  Diseases of the pancreas.  Infection. What increases the risk? Hyperglycemia is more likely to develop in people who have risk factors for diabetes, such as:  Having a family member with diabetes.  Having a gene for type 1 diabetes that is passed from parent to child (inherited).  Living in an area with cold weather conditions.  Exposure to certain viruses.  Certain conditions in which the body's disease-fighting (immune) system attacks itself (autoimmune disorders).  Being overweight or obese.  Having an inactive (sedentary) lifestyle.  Having been diagnosed with insulin resistance.  Having a  history of prediabetes, gestational diabetes, or polycystic ovarian syndrome (PCOS).  Being of American-Indian, African-American, Hispanic/Latino, or Asian/Pacific Islander descent. What are the signs or symptoms? Hyperglycemia may not cause any symptoms. If you do have symptoms, they may include early warning signs, such as:  Increased thirst.  Hunger.  Feeling very tired.  Needing to urinate more often than usual.  Blurry vision. Other symptoms may develop if hyperglycemia gets worse, such as:  Dry mouth.  Loss of appetite.  Fruity-smelling breath.  Weakness.  Unexpected or rapid weight gain or weight loss.  Tingling or numbness in the hands or feet.  Headache.  Skin that does not quickly return to normal after being lightly pinched and released (poor skin turgor).  Abdominal pain.  Cuts or bruises that are slow to heal. How is this diagnosed? Hyperglycemia is diagnosed with a blood test to measure your blood glucose level. This blood test is usually done while you are having symptoms. Your health care provider may also do a physical exam and review your medical history. You may have more tests to determine the cause of your hyperglycemia, such as:  A fasting blood glucose (FBG) test. You will not be allowed to eat (you will fast) for at least 8 hours before a blood sample is taken.  An A1c (hemoglobin A1c) blood test. This provides information about blood glucose control over the previous 2-3 months.  An oral glucose tolerance test (OGTT). This measures your blood glucose at two times: ? After fasting. This is your baseline blood glucose level. ? Two hours after drinking a beverage that contains glucose. How is this treated? Treatment depends on the cause of your hyperglycemia. Treatment may include:  Taking medicine to regulate your blood glucose levels. If you take insulin or other diabetes medicines, your medicine or dosage may be adjusted.  Lifestyle  changes, such as exercising more, eating healthier foods, or losing weight.  Treating an illness or infection, if this caused your hyperglycemia.  Checking your blood glucose more often.  Stopping or reducing steroid medicines, if these caused your hyperglycemia. If your hyperglycemia becomes severe and it results in hyperglycemic hyperosmolar state, you must be hospitalized and given IV fluids. Follow  these instructions at home:  General instructions  Take over-the-counter and prescription medicines only as told by your health care provider.  Do not use any products that contain nicotine or tobacco, such as cigarettes and e-cigarettes. If you need help quitting, ask your health care provider.  Limit alcohol intake to no more than 1 drink per day for nonpregnant women and 2 drinks per day for men. One drink equals 12 oz of beer, 5 oz of wine, or 1 oz of hard liquor.  Learn to manage stress. If you need help with this, ask your health care provider.  Keep all follow-up visits as told by your health care provider. This is important. Eating and drinking   Maintain a healthy weight.  Exercise regularly, as directed by your health care provider.  Stay hydrated, especially when you exercise, get sick, or spend time in hot temperatures.  Eat healthy foods, such as: ? Lean proteins. ? Complex carbohydrates. ? Fresh fruits and vegetables. ? Low-fat dairy products. ? Healthy fats.  Drink enough fluid to keep your urine clear or pale yellow. If you have diabetes:  Make sure you know the symptoms of hyperglycemia.  Follow your diabetes management plan, as told by your health care provider. Make sure you: ? Take your insulin and medicines as directed. ? Follow your exercise plan. ? Follow your meal plan. Eat on time, and do not skip meals. ? Check your blood glucose as often as directed. Make sure to check your blood glucose before and after exercise. If you exercise longer or in a  different way than usual, check your blood glucose more often. ? Follow your sick day plan whenever you cannot eat or drink normally. Make this plan in advance with your health care provider.  Share your diabetes management plan with people in your workplace, school, and household.  Check your urine for ketones when you are ill and as told by your health care provider.  Carry a medical alert card or wear medical alert jewelry. Contact a health care provider if:  Your blood glucose is at or above 240 mg/dL (13.3 mmol/L) for 2 days in a row.  You have problems keeping your blood glucose in your target range.  You have frequent episodes of hyperglycemia. Get help right away if:  You have difficulty breathing.  You have a change in how you think, feel, or act (mental status).  You have nausea or vomiting that does not go away. These symptoms may represent a serious problem that is an emergency. Do not wait to see if the symptoms will go away. Get medical help right away. Call your local emergency services (911 in the [place] Do not drive yourself to the hospital. Summary  Hyperglycemia occurs when the level of sugar (glucose) in the blood is too high.  Hyperglycemia is diagnosed with a blood test to measure your blood glucose level. This blood test is usually done while you are having symptoms. Your health care provider may also do a physical exam and review your medical history.  If you have diabetes, follow your diabetes management plan as told by your health care provider.  Contact your health care provider if you have problems keeping your blood glucose in your target range. This information is not intended to replace advice given to you by your health care provider. Make sure you discuss any questions you have with your health care provider. Document Revised: 12/28/2015 Document Reviewed: 12/28/2015 Elsevier Patient Education  [place].

## 2019-10-10 NOTE — Progress Notes (Addendum)
Inpatient Diabetes Program Recommendations  AACE/ADA: New Consensus Statement on Inpatient Glycemic Control (2015)  Target Ranges:  Prepandial:   less than 140 mg/dL      Peak postprandial:   less than 180 mg/dL (1-2 hours)      Critically ill patients:  140 - 180 mg/dL   Lab Results  Component Value Date   GLUCAP 262 (H) 10/10/2019   HGBA1C 9.1 (H) 06/25/2019    Review of Glycemic Control Results for Justin Ruiz, Justin Ruiz (MRN [id number]) as of 10/10/2019 09:03  Ref. Range 10/09/2019 16:51 10/09/2019 18:06 10/09/2019 19:19 10/09/2019 20:42 10/10/2019 07:33  Glucose-Capillary Latest Ref Range: 70 - 99 mg/dL >600 (HH) 579 (HH) 172 (H) 244 (H) 262 (H)   Diabetes history: DM2 Outpatient Diabetes medications: Novolin N 28 units bid + Novolin R 8 units tid + Ozempic q week Current orders for Inpatient glycemic control: NPH 15 units bid ac meals + Novolog moderate correction tid + hs 0-5 units  Inpatient Diabetes Program Recommendations:   Spoke with patient via phone (DM coordinator @ [place]). Patient states he has been taking Novolin N 24 units bid + Novolin R 8 units tid meal coverage although home medication list has 28 units bid listed. Patient requested call to wife to review insulin regimen but unable to reach by phone and left message for return call. Sent secure chat update to Dr. Eligah East.  Spoke with wife. She thinks pt. ate a lot of carbohydrates and probably didn't take his insulin. He is supposed to take Novolin N 28 units bid + Novolin R 8 units tid  Thank you, Nani Gasser. Keta [place], RN, MSN, CDE  Diabetes Coordinator Inpatient Glycemic Control Team Team Pager [phone] (8am-5pm) 10/10/2019 9:25 AM

## 2019-10-10 NOTE — Care Management Obs Status (Signed)
[facility] NOTIFICATION   Patient Details  Name: Justin Ruiz MRN: [id number] Date of Birth: 06-24-1950   Medicare Observation Status Notification Given:  Yes    Shade Flood, LCSW 10/10/2019, 12:04 PM

## 2019-10-16 ENCOUNTER — Encounter: Payer: Self-pay | Admitting: Family Medicine

## 2019-10-16 ENCOUNTER — Ambulatory Visit (INDEPENDENT_AMBULATORY_CARE_PROVIDER_SITE_OTHER): Payer: Medicare Other | Admitting: Family Medicine

## 2019-10-16 ENCOUNTER — Other Ambulatory Visit: Payer: Self-pay

## 2019-10-16 VITALS — BP 116/68 | HR 83 | Temp 97.1°F | Resp 20 | Ht 69.0 in | Wt 290.5 lb

## 2019-10-16 DIAGNOSIS — J9611 Chronic respiratory failure with hypoxia: Secondary | ICD-10-CM

## 2019-10-16 DIAGNOSIS — I482 Chronic atrial fibrillation, unspecified: Secondary | ICD-10-CM

## 2019-10-16 DIAGNOSIS — E1165 Type 2 diabetes mellitus with hyperglycemia: Secondary | ICD-10-CM

## 2019-10-16 DIAGNOSIS — E11319 Type 2 diabetes mellitus with unspecified diabetic retinopathy without macular edema: Secondary | ICD-10-CM

## 2019-10-16 MED ORDER — INSULIN NPH (HUMAN) (ISOPHANE) 100 UNIT/ML ~~LOC~~ SUSP: SUBCUTANEOUS | 11 refills | Status: DC

## 2019-10-16 NOTE — Progress Notes (Signed)
Subjective:  Patient ID: Justin Ruiz, male    DOB: 11-17-1950  Age: 69 y.o. MRN: [id number]  CC: Transitions Of Care   HPI Justin Ruiz presents for follow-up after hospitalization, transition of care.  He was admitted to the hospital on June 16 and discharged on June 17.  He was hydrated during his stay.  His initial status showed dehydration and severe hyperglycemia with initial blood sugar of 616 in the emergency department.  His heart rate jumped to 170s precipitating the admission.  His oxygen sats on oxygen stayed in normal range.  He was found to be in atrial fibrillation with a rapid ventricular response.  He was treated with a Cardizem bolus and drip.  His blood sugar was brought down with intravenous NovoLog 20 units.  Insulin drip became unnecessary.  Since discharge he has not had palpitations or chest pain.  He does remain somewhat weak.  His COPD is stable at this time.  He denies shortness of breath beyond his baseline as long as he is using his oxygen.  Depression screen Justin Ruiz 2/9 10/16/2019 09/25/2019 08/27/2019  Decreased Interest 0 0 0  Down, Depressed, Hopeless 0 0 0  PHQ - 2 Score 0 0 0  Altered sleeping - - -  Tired, decreased energy - - -  Change in appetite - - -  Feeling bad or failure about yourself  - - -  Trouble concentrating - - -  Moving slowly or fidgety/restless - - -  Suicidal thoughts - - -  PHQ-9 Score - - -  Difficult doing work/chores - - -  Some recent data might be hidden    History Justin Ruiz has a past medical history of Acute diastolic heart failure ([place]) (03/28/2016), Acute encephalopathy (03/28/2016), Acute encephalopathy (03/28/2016), Acute on chronic respiratory failure with hypoxia ([place]) (12/04/2016), Arthritis, Asthma, Atrial fibrillation ([place]), Benign neoplasm of ascending colon (05/05/2014), BPH (benign prostatic hypertrophy), CARDIOVASCULAR STUDIES, ABNORMAL (04/27/2009), Chronic anticoagulation (08/20/2012), Chronic respiratory failure with  hypoxia ([place]) (11/10/2017), Colon polyps, COPD mixed type ([place]) (03/28/2016), Diabetes mellitus, Diabetic neuropathy, type II diabetes mellitus ([place]) (09/25/2014), Diabetic retinopathy ([place]) (05/28/2017), 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   He has a past surgical history that includes Appendectomy and Colonoscopy with propofol (N/A, 05/05/2014).   His family history includes Aortic aneurysm in his sister; COPD in his sister and sister; Colon cancer in his father; Colon polyps in his father; Diabetes in his maternal grandmother, mother, and sister; Heart attack in his sister; Heart disease in his mother, sister, and sister; Liver cancer in his mother; Prostate cancer in his father; Pulmonary embolism in his sister.He reports that he quit smoking about a year ago. His smoking use included cigarettes. He started smoking about 50 years ago. He has a 40.00 pack-year smoking history. He has never used smokeless tobacco. He reports that he does not drink alcohol and does not use drugs.    ROS Review of Systems  Constitutional: Positive for fatigue. Negative for fever.  Respiratory: Positive for shortness of breath.   Cardiovascular: Positive for palpitations. Negative for chest pain.  Musculoskeletal: Negative for arthralgias.  Skin: Negative for rash.    Objective:  BP 116/68   Pulse 83   Temp (!) 97.1 F (36.2 C) (Temporal)   Resp 20   Ht  5\' 9"  (1.753 m)   Wt 290 lb 8 oz (131.8 kg)   SpO2 (!) 83%   BMI 42.90 kg/m   BP Readings from Last 3 Encounters:  10/16/19 116/68  10/10/19 130/74  09/25/19 137/74    Wt Readings from Last 3 Encounters:  10/16/19 290 lb 8 oz (131.8 kg)  10/09/19 271 lb 6.2 oz (123.1 kg)  09/25/19 286 lb 3.2 oz (129.8 kg)     Physical Exam Vitals reviewed.  Constitutional:      General: He is not in acute distress.    Appearance: He is well-developed. He is obese. He is ill-appearing.  HENT:     Head: Normocephalic and atraumatic.     Right Ear: External ear normal.     Left Ear: External ear normal.     Mouth/Throat:     Pharynx: No oropharyngeal exudate or posterior oropharyngeal erythema.  Cardiovascular:     Rate and Rhythm: Normal rate. Rhythm irregular.     Heart sounds: No murmur heard.   Pulmonary:     Effort: No respiratory distress.     Breath sounds: Rhonchi (Occasional) present.     Comments: Breath sounds distant Abdominal:     Palpations: Abdomen is soft.     Tenderness: There is no abdominal tenderness.  Musculoskeletal:     Cervical back: Normal range of motion and neck supple.  Neurological:     Mental Status: He is alert and oriented to person, place, and time.       Assessment & Plan:   Justin Ruiz was seen today for transitions of care.  Diagnoses and all orders for this visit:  Atrial fibrillation, chronic (HCC)  Diabetic retinopathy of both eyes associated with type 2 diabetes mellitus, macular edema presence unspecified, unspecified retinopathy severity (HCC) -     insulin NPH Human (NOVOLIN N) 100 UNIT/ML injection; Use 35 units before Breakfast and 30 before supper  Uncontrolled type 2 diabetes mellitus with hyperglycemia ([place])  Chronic respiratory failure with hypoxia ([place])       I have changed Justin Ruiz's insulin NPH Human. I am also having him maintain his Eliquis, simvastatin, acetaminophen, nystatin, oxymetazoline, insulin regular,  albuterol, escitalopram, furosemide, Vitamin D (Cholecalciferol), ALPRAZolam, budesonide, Stiolto Respimat, bisoprolol, ipratropium-albuterol, Trulicity, Ozempic (9.03 or 0.5 MG/DOSE), and COVID-19 mRNA vaccine (Moderna).  Allergies as of 10/16/2019      Reactions   Avapro [irbesartan] Other (See Comments)   "doesnt sit right with me"--light headed   Lipitor [atorvastatin Calcium] Other (See Comments)   Leg pain      Medication List       Accurate as of October 16, 2019 11:59 PM. If you have any questions, ask your nurse or doctor.        acetaminophen 325 MG tablet Commonly known as: TYLENOL Take 650 mg by mouth every 6 (six) hours as needed.   albuterol 108 (90 Base) MCG/ACT inhaler Commonly known as: VENTOLIN HFA Inhale 2 puffs into the lungs every 4 (four) hours as needed for wheezing or shortness of breath.   ALPRAZolam 0.25 MG tablet Commonly known as: XANAX Take 0.25 mg by mouth 3 (three) times daily as needed for anxiety or sleep.   bisoprolol 5 MG tablet Commonly known as: ZEBETA Take 1 tablet (5 mg total) by mouth 2 (  two) times daily.   budesonide 0.25 MG/2ML nebulizer solution Commonly known as: PULMICORT Take 2 mLs (0.25 mg total) by nebulization 2 (two) times daily.   COVID-19 mRNA vaccine (Moderna) 100 MCG/0.5ML Susp Inject 0.5 mLs into the muscle once.   Eliquis 5 MG Tabs tablet Generic drug: apixaban Take 1 tablet by mouth twice daily What changed: how much to take   escitalopram 20 MG tablet Commonly known as: LEXAPRO Take 1 tablet (20 mg total) by mouth daily.   furosemide 40 MG tablet Commonly known as: LASIX Take 1 tablet (40 mg total) by mouth 2 (two) times daily.   insulin NPH Human 100 UNIT/ML injection Commonly known as: NovoLIN N Use 35 units before Breakfast and 30 before supper What changed:   how much to take  how to take this  when to take this  additional instructions Changed by: [name], MD   insulin regular 100  units/mL injection Commonly known as: NOVOLIN R Inject 0.08 mLs (8 Units total) into the skin 3 (three) times daily before meals.   ipratropium-albuterol 0.5-2.5 (3) MG/3ML Soln Commonly known as: DUONEB USE 1 VIAL IN NEBULIZER EVERY 6 HOURS AS NEEDED What changed:   how much to take  how to take this  when to take this  reasons to take this  additional instructions   nystatin powder Commonly known as: MYCOSTATIN/NYSTOP Apply topically 2 (two) times daily. Apply to groin area and scrotum skin; keep area clean and dry.   oxymetazoline 0.05 % nasal spray Commonly known as: AFRIN Place 1 spray into both nostrils 2 (two) times daily as needed for congestion.   Ozempic (0.25 or 0.5 MG/DOSE) 2 MG/1.5ML Sopn Generic drug: Semaglutide(0.25 or 0.5MG /DOS) Inject 1 mg into the skin every 7 (seven) days.   simvastatin 40 MG tablet Commonly known as: ZOCOR Take 1 tablet by mouth once daily What changed: when to take this   Stiolto Respimat 2.5-2.5 MCG/ACT Aers Generic drug: Tiotropium Bromide-Olodaterol Inhale 1 puff into the lungs daily.   Trulicity 4.5 MP/5.3IR Sopn Generic drug: Dulaglutide Inject 4.5 mg as directed once a week.   Vitamin D (Cholecalciferol) 50 MCG (2000 UT) Caps Take 1 capsule by mouth in the morning.      While in the office today his pulse oximetry declined to 83.  He was feeling more short of breath at rest.  Upon inspection of his home tank it was found to be empty.  We used off his oxygen at 2 L to bring his pulse ox back up to the mid 90s.  He expressed relief at that time.  We discussed his insulin dose and need to check his blood sugar prior to meals.  He will follow up in 5 days.  Follow-up: Return in about 5 days (around 10/21/2019) for diabetes, COPD and atrial fibrillation.  [name], M.D.

## 2019-10-17 ENCOUNTER — Ambulatory Visit: Payer: Medicare Other | Admitting: Family Medicine

## 2019-10-17 DIAGNOSIS — Z7901 Long term (current) use of anticoagulants: Secondary | ICD-10-CM | POA: Diagnosis not present

## 2019-10-17 DIAGNOSIS — S91114D Laceration without foreign body of right lesser toe(s) without damage to nail, subsequent encounter: Secondary | ICD-10-CM | POA: Diagnosis not present

## 2019-10-17 DIAGNOSIS — Z794 Long term (current) use of insulin: Secondary | ICD-10-CM | POA: Diagnosis not present

## 2019-10-17 DIAGNOSIS — J9621 Acute and chronic respiratory failure with hypoxia: Secondary | ICD-10-CM | POA: Diagnosis not present

## 2019-10-17 DIAGNOSIS — J449 Chronic obstructive pulmonary disease, unspecified: Secondary | ICD-10-CM | POA: Diagnosis not present

## 2019-10-17 DIAGNOSIS — I5033 Acute on chronic diastolic (congestive) heart failure: Secondary | ICD-10-CM | POA: Diagnosis not present

## 2019-10-17 DIAGNOSIS — C8590 Non-Hodgkin lymphoma, unspecified, unspecified site: Secondary | ICD-10-CM | POA: Diagnosis not present

## 2019-10-17 DIAGNOSIS — D472 Monoclonal gammopathy: Secondary | ICD-10-CM | POA: Diagnosis not present

## 2019-10-17 DIAGNOSIS — K219 Gastro-esophageal reflux disease without esophagitis: Secondary | ICD-10-CM | POA: Diagnosis not present

## 2019-10-17 DIAGNOSIS — E1140 Type 2 diabetes mellitus with diabetic neuropathy, unspecified: Secondary | ICD-10-CM | POA: Diagnosis not present

## 2019-10-17 DIAGNOSIS — M1990 Unspecified osteoarthritis, unspecified site: Secondary | ICD-10-CM | POA: Diagnosis not present

## 2019-10-17 DIAGNOSIS — R911 Solitary pulmonary nodule: Secondary | ICD-10-CM | POA: Diagnosis not present

## 2019-10-17 DIAGNOSIS — E1151 Type 2 diabetes mellitus with diabetic peripheral angiopathy without gangrene: Secondary | ICD-10-CM | POA: Diagnosis not present

## 2019-10-17 DIAGNOSIS — G4733 Obstructive sleep apnea (adult) (pediatric): Secondary | ICD-10-CM | POA: Diagnosis not present

## 2019-10-17 DIAGNOSIS — D696 Thrombocytopenia, unspecified: Secondary | ICD-10-CM | POA: Diagnosis not present

## 2019-10-17 DIAGNOSIS — Z9981 Dependence on supplemental oxygen: Secondary | ICD-10-CM | POA: Diagnosis not present

## 2019-10-17 DIAGNOSIS — E785 Hyperlipidemia, unspecified: Secondary | ICD-10-CM | POA: Diagnosis not present

## 2019-10-17 DIAGNOSIS — I110 Hypertensive heart disease with heart failure: Secondary | ICD-10-CM | POA: Diagnosis not present

## 2019-10-17 DIAGNOSIS — E11319 Type 2 diabetes mellitus with unspecified diabetic retinopathy without macular edema: Secondary | ICD-10-CM | POA: Diagnosis not present

## 2019-10-17 DIAGNOSIS — I482 Chronic atrial fibrillation, unspecified: Secondary | ICD-10-CM | POA: Diagnosis not present

## 2019-10-17 DIAGNOSIS — I872 Venous insufficiency (chronic) (peripheral): Secondary | ICD-10-CM | POA: Diagnosis not present

## 2019-10-18 ENCOUNTER — Telehealth: Payer: Self-pay | Admitting: Internal Medicine

## 2019-10-18 NOTE — Telephone Encounter (Signed)
This late on Friday afternoon, we can't see him here, I recommend he go to ER for evaluation.

## 2019-10-18 NOTE — Telephone Encounter (Signed)
Called and spoke to [place], patient's wife. Relayed message from Dr. Annamaria Boots to seek care at the emergency department for complaints received on triage line today.

## 2019-10-18 NOTE — Telephone Encounter (Signed)
Patient's wife calling with concern over Justin Ruiz. States he is complaining of not being able to breath, having episodes of panic, SOB is unchanged, mild dry cough, 3.5 liters oxygen, sats 93% this AM. Home health RN visited home yesterday. Next appointment on 11/04/2019 Please advise.

## 2019-10-20 ENCOUNTER — Encounter: Payer: Self-pay | Admitting: Family Medicine

## 2019-10-21 ENCOUNTER — Encounter: Payer: Self-pay | Admitting: Family Medicine

## 2019-10-21 ENCOUNTER — Ambulatory Visit (INDEPENDENT_AMBULATORY_CARE_PROVIDER_SITE_OTHER): Payer: Medicare Other | Admitting: Family Medicine

## 2019-10-21 ENCOUNTER — Other Ambulatory Visit: Payer: Self-pay

## 2019-10-21 ENCOUNTER — Telehealth: Payer: Self-pay | Admitting: Pharmacist

## 2019-10-21 VITALS — BP 100/60 | HR 95 | Temp 97.4°F | Resp 22 | Ht 69.0 in | Wt 289.4 lb

## 2019-10-21 DIAGNOSIS — I482 Chronic atrial fibrillation, unspecified: Secondary | ICD-10-CM | POA: Diagnosis not present

## 2019-10-21 DIAGNOSIS — E1165 Type 2 diabetes mellitus with hyperglycemia: Secondary | ICD-10-CM | POA: Diagnosis not present

## 2019-10-21 DIAGNOSIS — E11319 Type 2 diabetes mellitus with unspecified diabetic retinopathy without macular edema: Secondary | ICD-10-CM | POA: Diagnosis not present

## 2019-10-21 LAB — GLUCOSE HEMOCUE WAIVED: Glu Hemocue Waived: 344 mg/dL — ABNORMAL HIGH (ref 65–99)

## 2019-10-21 MED ORDER — INSULIN NPH (HUMAN) (ISOPHANE) 100 UNIT/ML ~~LOC~~ SUSP: SUBCUTANEOUS | 11 refills | Status: DC

## 2019-10-21 MED ORDER — INSULIN REGULAR HUMAN 100 UNIT/ML IJ SOLN: INTRAMUSCULAR | 5 refills | Status: AC

## 2019-10-21 NOTE — Telephone Encounter (Signed)
Libre 2 CGM paperwork submitted to [employer] Informed wife that [employer] would be calling and to answer phone Clinical notes, insurance card faxed to company

## 2019-10-21 NOTE — Progress Notes (Signed)
Subjective:  Patient ID: Justin Ruiz, male    DOB: 12-Feb-1951  Age: 69 y.o. MRN: [id number]  CC: Follow-up (Blood Sugar)   HPI Justin Ruiz presents for recheck of his elevated glucose.  Blood sugar readings brought in show that his readings before breakfast are running between 270 and 350 this week.  At lunchtime the glucose is reading between 200-322.  At suppertime it ranges from 240- 340.  Bedtime readings were not returned.  Patient denies any low sugar reactions.  He does continue to have shortness of breath.  He denies any excessive bruising palpitations or chest pain related to his atrial fibrillation.  This has resolved since his recent hospital stay.  Depression screen [facility] 2/9 10/21/2019 10/16/2019 09/25/2019  Decreased Interest 0 0 0  Down, Depressed, Hopeless 0 0 0  PHQ - 2 Score 0 0 0  Altered sleeping - - -  Tired, decreased energy - - -  Change in appetite - - -  Feeling bad or failure about yourself  - - -  Trouble concentrating - - -  Moving slowly or fidgety/restless - - -  Suicidal thoughts - - -  PHQ-9 Score - - -  Difficult doing work/chores - - -  Some recent data might be hidden    History Justin Ruiz has a past medical history of Acute diastolic heart failure ([place]) (03/28/2016), Acute encephalopathy (03/28/2016), Acute encephalopathy (03/28/2016), Acute on chronic respiratory failure with hypoxia ([place]) (12/04/2016), Arthritis, Asthma, Atrial fibrillation ([place]), Benign neoplasm of ascending colon (05/05/2014), BPH (benign prostatic hypertrophy), CARDIOVASCULAR STUDIES, ABNORMAL (04/27/2009), Chronic anticoagulation (08/20/2012), Chronic respiratory failure with hypoxia ([place]) (11/10/2017), Colon polyps, COPD mixed type ([place]) (03/28/2016), Diabetes mellitus, Diabetic neuropathy, type II diabetes mellitus ([place]) (09/25/2014), Diabetic retinopathy ([place]) (05/28/2017), 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) (05/01/2013), Shingles, Sleep apnea, and Thrombocytopenia ([place]) (11/23/2016).   He has a past surgical history that includes Appendectomy and Colonoscopy with propofol (N/A, 05/05/2014).   His family history includes Aortic aneurysm in his sister; COPD in his sister and sister; Colon cancer in his father; Colon polyps in his father; Diabetes in his maternal grandmother, mother, and sister; Heart attack in his sister; Heart disease in his mother, sister, and sister; Liver cancer in his mother; Prostate cancer in his father; Pulmonary embolism in his sister.He reports that he quit smoking about a year ago. His smoking use included cigarettes. He started smoking about 50 years ago. He has a 40.00 pack-year smoking history. He has never used smokeless tobacco. He reports that he does not drink alcohol and does not use drugs.    ROS Review of Systems  Constitutional: Negative for fever.  Respiratory: Positive for shortness of breath (Chronic, related to his oxygen tank running out on the way to this office.  It is adequate at home when he has his concentrator in use.).   Cardiovascular: Negative for chest pain and palpitations.  Musculoskeletal: Negative for arthralgias.  Skin: Negative for rash.    Objective:  BP 100/60    Pulse 95    Temp (!) 97.4 F (36.3 C) (Temporal)    Resp (!) 22    Ht 5\' 9"  (1.753  m)    Wt 289 lb 6.4 oz (131.3 kg)    SpO2 94%    BMI 42.74 kg/m   BP Readings from Last 3 Encounters:  10/21/19 100/60  10/16/19 116/68  10/10/19 130/74    Wt Readings from Last 3  Encounters:  10/21/19 289 lb 6.4 oz (131.3 kg)  10/16/19 290 lb 8 oz (131.8 kg)  10/09/19 271 lb 6.2 oz (123.1 kg)     Physical Exam Vitals reviewed.  Constitutional:      Appearance: He is well-developed.  HENT:     Head: Normocephalic and atraumatic.     Right Ear: External ear normal.     Left Ear: External ear normal.     Mouth/Throat:     Pharynx: No oropharyngeal exudate or posterior oropharyngeal erythema.  Eyes:     Pupils: Pupils are equal, round, and reactive to light.  Cardiovascular:     Rate and Rhythm: Normal rate and regular rhythm.     Heart sounds: No murmur heard.   Pulmonary:     Effort: No respiratory distress.     Breath sounds: No rhonchi or rales.     Comments: Distant, diminished Abdominal:     Palpations: Abdomen is soft.     Tenderness: There is no abdominal tenderness.  Musculoskeletal:     Cervical back: Normal range of motion and neck supple.  Neurological:     Mental Status: He is alert and oriented to person, place, and time.  Psychiatric:        Attention and Perception: Attention normal.        Mood and Affect: Affect is blunt.        Speech: Speech is slurred.        Behavior: Behavior is cooperative.        Cognition and Memory: Cognition is impaired.       Assessment & Plan:   Justin Ruiz was seen today for follow-up.  Diagnoses and all orders for this visit:  Uncontrolled type 2 diabetes mellitus with hyperglycemia ([place]) -     Glucose Hemocue Waived  Diabetic retinopathy of both eyes associated with type 2 diabetes mellitus, macular edema presence unspecified, unspecified retinopathy severity (HCC) -     insulin NPH Human (NOVOLIN N) 100 UNIT/ML injection; Use 40 units before Breakfast and 35 before supper  Atrial fibrillation, chronic (HCC)  Other orders -     insulin regular (NOVOLIN R) 100 units/mL injection; Inject 10 units before breakfast and again before lunch. Inject 12 units before supper.    Patient is currently  on 5 injections of insulin a day in addition to his weekly Trulicity injection.  It is essential that he check his blood sugar 4 or more times a day.  His insulin is adjusted as noted above at each visit based on his glucose log which he returns with those listings.   I have changed Justin Ruiz's insulin NPH Human and insulin regular. I am also having him maintain his Eliquis, simvastatin, acetaminophen, nystatin, oxymetazoline, albuterol, escitalopram, furosemide, Vitamin D (Cholecalciferol), ALPRAZolam, budesonide, Stiolto Respimat, bisoprolol, ipratropium-albuterol, Trulicity, Ozempic (5.73 or 0.5 MG/DOSE), and COVID-19 mRNA vaccine (Moderna).  Allergies as of 10/21/2019      Reactions   Avapro [irbesartan] Other (See Comments)   "doesnt sit right with me"--light headed   Lipitor [atorvastatin Calcium] Other (See Comments)   Leg pain      Medication List       Accurate as of October 21, 2019  6:04 PM. If you have any questions, ask your nurse or doctor.        acetaminophen 325 MG tablet Commonly known as: TYLENOL Take 650 mg by mouth every 6 (six) hours as needed.   albuterol 108 (90 Base) MCG/ACT inhaler Commonly known as: VENTOLIN HFA Inhale 2 puffs into the lungs every 4 (four) hours as needed for wheezing or shortness of breath.   ALPRAZolam 0.25 MG tablet Commonly known as: XANAX Take 0.25 mg by mouth 3 (three) times daily as needed for anxiety or sleep.   bisoprolol 5 MG tablet Commonly known as: ZEBETA Take 1 tablet (5 mg total) by mouth 2 (two) times daily.   budesonide 0.25 MG/2ML nebulizer solution Commonly known as: PULMICORT Take 2 mLs (0.25 mg total) by nebulization 2 (two) times daily.   COVID-19 mRNA vaccine (Moderna) 100 MCG/0.5ML Susp Inject 0.5 mLs into the muscle once.   Eliquis 5 MG Tabs tablet Generic drug: apixaban Take 1 tablet by mouth twice daily What changed: how much to take   escitalopram 20 MG tablet Commonly known as: LEXAPRO Take 1  tablet (20 mg total) by mouth daily.   furosemide 40 MG tablet Commonly known as: LASIX Take 1 tablet (40 mg total) by mouth 2 (two) times daily.   insulin NPH Human 100 UNIT/ML injection Commonly known as: NovoLIN N Use 40 units before Breakfast and 35 before supper What changed: additional instructions Changed by: [name], MD   insulin regular 100 units/mL injection Commonly known as: NOVOLIN R Inject 10 units before breakfast and again before lunch. Inject 12 units before supper. What changed:   how much to take  how to take this  when to take this  additional instructions Changed by: [name], MD   ipratropium-albuterol 0.5-2.5 (3) MG/3ML Soln Commonly known as: DUONEB USE 1 VIAL IN NEBULIZER EVERY 6 HOURS AS NEEDED What changed:   how much to take  how to take this  when to take this  reasons to take this  additional instructions   nystatin powder Commonly known as: MYCOSTATIN/NYSTOP Apply topically 2 (two) times daily. Apply to groin area and scrotum skin; keep area clean and dry.   oxymetazoline 0.05 % nasal spray Commonly known as: AFRIN Place 1 spray into both nostrils 2 (two) times daily as needed for congestion.   Ozempic (0.25 or 0.5 MG/DOSE) 2 MG/1.5ML Sopn Generic drug: Semaglutide(0.25 or 0.5MG /DOS) Inject 1 mg into the skin every 7 (seven) days.   simvastatin 40 MG tablet Commonly known as: ZOCOR Take 1 tablet by mouth once daily What changed: when to take this   Stiolto Respimat 2.5-2.5 MCG/ACT Aers Generic drug: Tiotropium Bromide-Olodaterol Inhale 1 puff into the lungs daily.   Trulicity 4.5 HD/6.2IW Sopn Generic drug: Dulaglutide Inject 4.5 mg as directed once a week.   Vitamin D (Cholecalciferol) 50 MCG (2000 UT) Caps Take 1 capsule by mouth in the morning.        Follow-up: No follow-ups on file.  [name], M.D.

## 2019-10-22 ENCOUNTER — Other Ambulatory Visit: Payer: Self-pay | Admitting: Pharmacy Technician

## 2019-10-22 DIAGNOSIS — E1140 Type 2 diabetes mellitus with diabetic neuropathy, unspecified: Secondary | ICD-10-CM | POA: Diagnosis not present

## 2019-10-22 DIAGNOSIS — E1151 Type 2 diabetes mellitus with diabetic peripheral angiopathy without gangrene: Secondary | ICD-10-CM | POA: Diagnosis not present

## 2019-10-22 DIAGNOSIS — Z7901 Long term (current) use of anticoagulants: Secondary | ICD-10-CM | POA: Diagnosis not present

## 2019-10-22 DIAGNOSIS — M1990 Unspecified osteoarthritis, unspecified site: Secondary | ICD-10-CM | POA: Diagnosis not present

## 2019-10-22 DIAGNOSIS — I5033 Acute on chronic diastolic (congestive) heart failure: Secondary | ICD-10-CM | POA: Diagnosis not present

## 2019-10-22 DIAGNOSIS — J449 Chronic obstructive pulmonary disease, unspecified: Secondary | ICD-10-CM | POA: Diagnosis not present

## 2019-10-22 DIAGNOSIS — D696 Thrombocytopenia, unspecified: Secondary | ICD-10-CM | POA: Diagnosis not present

## 2019-10-22 DIAGNOSIS — I872 Venous insufficiency (chronic) (peripheral): Secondary | ICD-10-CM | POA: Diagnosis not present

## 2019-10-22 DIAGNOSIS — I110 Hypertensive heart disease with heart failure: Secondary | ICD-10-CM | POA: Diagnosis not present

## 2019-10-22 DIAGNOSIS — Z794 Long term (current) use of insulin: Secondary | ICD-10-CM | POA: Diagnosis not present

## 2019-10-22 DIAGNOSIS — S91114D Laceration without foreign body of right lesser toe(s) without damage to nail, subsequent encounter: Secondary | ICD-10-CM | POA: Diagnosis not present

## 2019-10-22 DIAGNOSIS — I482 Chronic atrial fibrillation, unspecified: Secondary | ICD-10-CM | POA: Diagnosis not present

## 2019-10-22 DIAGNOSIS — C8590 Non-Hodgkin lymphoma, unspecified, unspecified site: Secondary | ICD-10-CM | POA: Diagnosis not present

## 2019-10-22 DIAGNOSIS — E11319 Type 2 diabetes mellitus with unspecified diabetic retinopathy without macular edema: Secondary | ICD-10-CM | POA: Diagnosis not present

## 2019-10-22 DIAGNOSIS — J9621 Acute and chronic respiratory failure with hypoxia: Secondary | ICD-10-CM | POA: Diagnosis not present

## 2019-10-22 DIAGNOSIS — E785 Hyperlipidemia, unspecified: Secondary | ICD-10-CM | POA: Diagnosis not present

## 2019-10-22 DIAGNOSIS — R911 Solitary pulmonary nodule: Secondary | ICD-10-CM | POA: Diagnosis not present

## 2019-10-22 DIAGNOSIS — K219 Gastro-esophageal reflux disease without esophagitis: Secondary | ICD-10-CM | POA: Diagnosis not present

## 2019-10-22 DIAGNOSIS — Z9981 Dependence on supplemental oxygen: Secondary | ICD-10-CM | POA: Diagnosis not present

## 2019-10-22 DIAGNOSIS — D472 Monoclonal gammopathy: Secondary | ICD-10-CM | POA: Diagnosis not present

## 2019-10-22 DIAGNOSIS — G4733 Obstructive sleep apnea (adult) (pediatric): Secondary | ICD-10-CM | POA: Diagnosis not present

## 2019-10-22 NOTE — Patient Outreach (Signed)
[facility]) Care Management  10/22/2019  KELLON CHALK 1950/07/19 [id number]   Successful call placed to patient's wife [name] regarding patient assistance medication delivery of patient's Proventil HFA with [employer], HIPAA identifiers verified.   Patient's wife [name] confirms receiving the medication. Discussed refill procedure with [name] which will require the patient to phone [facility], [place] pharmacy, when he opens the last Proventil HFA inhaler box. Informed her where to find the phone number on the pharmacy label on the inhaler box. Patient's wife verbalized understanding.  [name] inquire about how to obtain more Trulicity or Ozempic. Informed [name] that I did not assist patient with obtaining Trulicity and referred her back to the clinic pharmacist, [name]. Informed [name] in order to receive more Ozempic the provider would need to fill out the refill form that is faxed to the office approximately 80 days after last fill date. Informed patient's wife that I would send an inbasket message to clinic RPh [name] to call and assist patient and [name] was agreeable to this plan.  Inbasket message was sent and the following reply was received: Lavera Guise, RPH  Tiawanna Luchsinger, Luiz Ochoa, CPhT No worries!! Will do   Thanks!!       Previous Messages ----- Message -----  From: Jason Fila, CPhT  Sent: 10/22/2019 10:48 AM EDT  To: Lavera Guise, RPH  Subject: please call patient         Follow up:  Will close patient's case as patient assistance is completed. Will remove myself from care team.  Luiz Ochoa. Davi Rotan, [place]  [phone]

## 2019-10-24 ENCOUNTER — Telehealth: Payer: Self-pay | Admitting: *Deleted

## 2019-10-24 NOTE — Telephone Encounter (Signed)
Thanks for the update

## 2019-10-24 NOTE — Telephone Encounter (Signed)
Nunzio Cory PT from [place] at [facility] called stating she went to pt's house to evaluate and pt declined services stating he doesn't want them to send anyone else back out as he doesn't want to do PT.

## 2019-10-25 ENCOUNTER — Ambulatory Visit (INDEPENDENT_AMBULATORY_CARE_PROVIDER_SITE_OTHER): Payer: Medicare Other | Admitting: Pharmacist

## 2019-10-25 DIAGNOSIS — S91114D Laceration without foreign body of right lesser toe(s) without damage to nail, subsequent encounter: Secondary | ICD-10-CM | POA: Diagnosis not present

## 2019-10-25 DIAGNOSIS — Z794 Long term (current) use of insulin: Secondary | ICD-10-CM | POA: Diagnosis not present

## 2019-10-25 DIAGNOSIS — I872 Venous insufficiency (chronic) (peripheral): Secondary | ICD-10-CM | POA: Diagnosis not present

## 2019-10-25 DIAGNOSIS — I5033 Acute on chronic diastolic (congestive) heart failure: Secondary | ICD-10-CM | POA: Diagnosis not present

## 2019-10-25 DIAGNOSIS — E1151 Type 2 diabetes mellitus with diabetic peripheral angiopathy without gangrene: Secondary | ICD-10-CM | POA: Diagnosis not present

## 2019-10-25 DIAGNOSIS — G4733 Obstructive sleep apnea (adult) (pediatric): Secondary | ICD-10-CM | POA: Diagnosis not present

## 2019-10-25 DIAGNOSIS — R911 Solitary pulmonary nodule: Secondary | ICD-10-CM | POA: Diagnosis not present

## 2019-10-25 DIAGNOSIS — E11319 Type 2 diabetes mellitus with unspecified diabetic retinopathy without macular edema: Secondary | ICD-10-CM | POA: Diagnosis not present

## 2019-10-25 DIAGNOSIS — I482 Chronic atrial fibrillation, unspecified: Secondary | ICD-10-CM | POA: Diagnosis not present

## 2019-10-25 DIAGNOSIS — K219 Gastro-esophageal reflux disease without esophagitis: Secondary | ICD-10-CM | POA: Diagnosis not present

## 2019-10-25 DIAGNOSIS — E1165 Type 2 diabetes mellitus with hyperglycemia: Secondary | ICD-10-CM

## 2019-10-25 DIAGNOSIS — E785 Hyperlipidemia, unspecified: Secondary | ICD-10-CM | POA: Diagnosis not present

## 2019-10-25 DIAGNOSIS — J449 Chronic obstructive pulmonary disease, unspecified: Secondary | ICD-10-CM | POA: Diagnosis not present

## 2019-10-25 DIAGNOSIS — I110 Hypertensive heart disease with heart failure: Secondary | ICD-10-CM | POA: Diagnosis not present

## 2019-10-25 DIAGNOSIS — Z7901 Long term (current) use of anticoagulants: Secondary | ICD-10-CM | POA: Diagnosis not present

## 2019-10-25 DIAGNOSIS — J9621 Acute and chronic respiratory failure with hypoxia: Secondary | ICD-10-CM | POA: Diagnosis not present

## 2019-10-25 DIAGNOSIS — C8590 Non-Hodgkin lymphoma, unspecified, unspecified site: Secondary | ICD-10-CM | POA: Diagnosis not present

## 2019-10-25 DIAGNOSIS — M1990 Unspecified osteoarthritis, unspecified site: Secondary | ICD-10-CM | POA: Diagnosis not present

## 2019-10-25 DIAGNOSIS — D472 Monoclonal gammopathy: Secondary | ICD-10-CM | POA: Diagnosis not present

## 2019-10-25 DIAGNOSIS — Z9981 Dependence on supplemental oxygen: Secondary | ICD-10-CM | POA: Diagnosis not present

## 2019-10-25 DIAGNOSIS — D696 Thrombocytopenia, unspecified: Secondary | ICD-10-CM | POA: Diagnosis not present

## 2019-10-25 DIAGNOSIS — E1140 Type 2 diabetes mellitus with diabetic neuropathy, unspecified: Secondary | ICD-10-CM | POA: Diagnosis not present

## 2019-10-25 NOTE — Progress Notes (Signed)
    10/25/2019 Name: Justin Ruiz MRN: [id number] DOB: 09-Sep-1950   S:  33 yoM presents for diabetes evaluation, education, and management Patient was referred and last seen by Primary Care Provider on 10/21/19.  Insurance coverage/medication affordability:[employer] medicare  I connected with  Marnee Guarneri on 10/25/19 by a video enabled telemedicine application and verified that I am speaking with the correct person using two identifiers.   I discussed the limitations of evaluation and management by telemedicine. The patient expressed understanding and agreed to proceed.  Patient reports adherence with medications. . Current diabetes medications include: trulicity, novolin R w/ meals, NPH BID, . Current hypertension medications include: bisoprolol Goal 130/80 . Current hyperlipidemia medications include: simvastatin   Patient denies hypoglycemic events.   Patient reported dietary habits: Eats 2-3 meals/day Discussed meal planning options and Plate method for healthy eating Avoid sugary drinks and desserts Incorporate balanced protein, non starchy veggies, 1 serving of carbohydrate Increase water intake Increase physical activity as able.  Patient-reported exercise habits: n/a due to condition, on oxygen  O:  Lab Results  Component Value Date   HGBA1C 9.1 (H) 06/25/2019    Lipid Panel     Component Value Date/Time   CHOL 169 02/20/2019 0928   CHOL 140 11/14/2012 0918   TRIG 300 (H) 02/20/2019 0928   TRIG 159 (H) 02/17/2016 1006   TRIG 296 (H) 11/14/2012 0918   HDL 44 02/20/2019 0928   HDL 47 02/17/2016 1006   HDL 43 11/14/2012 0918   CHOLHDL 3.8 02/20/2019 0928   LDLCALC 77 02/20/2019 0928   LDLCALC 49 11/28/2013 1026   LDLCALC 38 11/14/2012 0918    Home fasting blood sugars: wife states they have improved to the 200s  2 hour post-meal/random blood sugars: 300s.   A/P:  Diabetes T2DM currently uncontrolled. Patient is able to verbalize appropriate hypoglycemia  management plan. Patient is adherent with medication. Control is suboptimal due to diet.  -GLP1 (Trulicity) was recently increased to 4.5mg  sq weekly  Patient denies side effects  BGs appear to be improving   Discontinued Ozempic  PAP paperwork filed for [employer] cares (free trulicity to ship to patient's home)  -Continue insulin plan per chart.  Will consider improved basal/bolus regimen at next visit  -Paperwork filed for [employer] system  -Extensively discussed pathophysiology of diabetes, recommended lifestyle interventions, dietary effects on blood sugar control  -Counseled on s/sx of and management of hypoglycemia  -Next A1C anticipated 3-6 months    Total time in counseling 15 minutes.   Follow up PCP Clinic Visit ON 11/11/19.  Repeat A1c at that time  Regina Eck, PharmD, BCPS Clinical Pharmacist, [place]  [facility] Phone [phone]

## 2019-10-27 ENCOUNTER — Other Ambulatory Visit: Payer: Self-pay | Admitting: Family Medicine

## 2019-10-27 DIAGNOSIS — I5032 Chronic diastolic (congestive) heart failure: Secondary | ICD-10-CM

## 2019-10-29 ENCOUNTER — Ambulatory Visit: Payer: Medicare Other | Admitting: *Deleted

## 2019-10-29 ENCOUNTER — Telehealth: Payer: Self-pay | Admitting: Pharmacist

## 2019-10-29 DIAGNOSIS — J9621 Acute and chronic respiratory failure with hypoxia: Secondary | ICD-10-CM | POA: Diagnosis not present

## 2019-10-29 DIAGNOSIS — G4733 Obstructive sleep apnea (adult) (pediatric): Secondary | ICD-10-CM | POA: Diagnosis not present

## 2019-10-29 DIAGNOSIS — Z794 Long term (current) use of insulin: Secondary | ICD-10-CM

## 2019-10-29 DIAGNOSIS — R911 Solitary pulmonary nodule: Secondary | ICD-10-CM | POA: Diagnosis not present

## 2019-10-29 DIAGNOSIS — D696 Thrombocytopenia, unspecified: Secondary | ICD-10-CM | POA: Diagnosis not present

## 2019-10-29 DIAGNOSIS — J449 Chronic obstructive pulmonary disease, unspecified: Secondary | ICD-10-CM | POA: Diagnosis not present

## 2019-10-29 DIAGNOSIS — E785 Hyperlipidemia, unspecified: Secondary | ICD-10-CM | POA: Diagnosis not present

## 2019-10-29 DIAGNOSIS — I872 Venous insufficiency (chronic) (peripheral): Secondary | ICD-10-CM | POA: Diagnosis not present

## 2019-10-29 DIAGNOSIS — Z7901 Long term (current) use of anticoagulants: Secondary | ICD-10-CM | POA: Diagnosis not present

## 2019-10-29 DIAGNOSIS — E11319 Type 2 diabetes mellitus with unspecified diabetic retinopathy without macular edema: Secondary | ICD-10-CM | POA: Diagnosis not present

## 2019-10-29 DIAGNOSIS — Z9981 Dependence on supplemental oxygen: Secondary | ICD-10-CM | POA: Diagnosis not present

## 2019-10-29 DIAGNOSIS — K219 Gastro-esophageal reflux disease without esophagitis: Secondary | ICD-10-CM | POA: Diagnosis not present

## 2019-10-29 DIAGNOSIS — I5033 Acute on chronic diastolic (congestive) heart failure: Secondary | ICD-10-CM | POA: Diagnosis not present

## 2019-10-29 DIAGNOSIS — C8590 Non-Hodgkin lymphoma, unspecified, unspecified site: Secondary | ICD-10-CM | POA: Diagnosis not present

## 2019-10-29 DIAGNOSIS — S91114D Laceration without foreign body of right lesser toe(s) without damage to nail, subsequent encounter: Secondary | ICD-10-CM | POA: Diagnosis not present

## 2019-10-29 DIAGNOSIS — D472 Monoclonal gammopathy: Secondary | ICD-10-CM | POA: Diagnosis not present

## 2019-10-29 DIAGNOSIS — I10 Essential (primary) hypertension: Secondary | ICD-10-CM

## 2019-10-29 DIAGNOSIS — I482 Chronic atrial fibrillation, unspecified: Secondary | ICD-10-CM | POA: Diagnosis not present

## 2019-10-29 DIAGNOSIS — E1140 Type 2 diabetes mellitus with diabetic neuropathy, unspecified: Secondary | ICD-10-CM | POA: Diagnosis not present

## 2019-10-29 DIAGNOSIS — M1990 Unspecified osteoarthritis, unspecified site: Secondary | ICD-10-CM | POA: Diagnosis not present

## 2019-10-29 DIAGNOSIS — E1151 Type 2 diabetes mellitus with diabetic peripheral angiopathy without gangrene: Secondary | ICD-10-CM | POA: Diagnosis not present

## 2019-10-29 DIAGNOSIS — I110 Hypertensive heart disease with heart failure: Secondary | ICD-10-CM | POA: Diagnosis not present

## 2019-10-29 NOTE — Chronic Care Management (AMB) (Signed)
  Chronic Care Management   Follow-up Outreach Note  10/29/2019 Name: Justin Ruiz MRN: [id number] DOB: [birth year]  Referred by: [name], MD Reason for referral : Chronic Care Management (diabetes, HTN, COPD)   An unsuccessful telephone follow-up was attempted today. The patient was referred to the case management team for assistance with care management and care coordination.   [name] has been working with [name], PharmD at [facility] regarding diabetes management and prescription assistance. Most recent visit was on 10/25/19 and there are additional notes from today.   Follow Up Plan: The care management team will reach out to the patient again over the next 30 days.  Follow-up with Dr Livia Snellen on 11/11/19 Follow-up telephone call with LCSW on 11/13/19 Follow-up with pulmonary on 01/01/20  [name], BSN, RN-BC [place] [facility] [place] Management Direct Dial: [phone]

## 2019-10-29 NOTE — Telephone Encounter (Signed)
Patient assistance application submitted for Trulicity 4.5mg  sq weekly via [employer] Patient stable on new dose Discontinued ozempic

## 2019-10-30 ENCOUNTER — Ambulatory Visit: Payer: Medicare Other | Admitting: Cardiology

## 2019-10-30 ENCOUNTER — Telehealth: Payer: Self-pay | Admitting: Family Medicine

## 2019-10-30 NOTE — Telephone Encounter (Signed)
Patient aware to pick up 

## 2019-11-04 ENCOUNTER — Ambulatory Visit: Payer: Medicare Other | Admitting: Internal Medicine

## 2019-11-04 ENCOUNTER — Ambulatory Visit: Payer: Medicare Other | Admitting: Family Medicine

## 2019-11-04 DIAGNOSIS — M6281 Muscle weakness (generalized): Secondary | ICD-10-CM | POA: Diagnosis not present

## 2019-11-04 DIAGNOSIS — I872 Venous insufficiency (chronic) (peripheral): Secondary | ICD-10-CM | POA: Diagnosis not present

## 2019-11-04 DIAGNOSIS — I509 Heart failure, unspecified: Secondary | ICD-10-CM | POA: Diagnosis not present

## 2019-11-04 DIAGNOSIS — J9611 Chronic respiratory failure with hypoxia: Secondary | ICD-10-CM | POA: Diagnosis not present

## 2019-11-06 DIAGNOSIS — J449 Chronic obstructive pulmonary disease, unspecified: Secondary | ICD-10-CM | POA: Diagnosis not present

## 2019-11-09 DIAGNOSIS — J449 Chronic obstructive pulmonary disease, unspecified: Secondary | ICD-10-CM | POA: Diagnosis not present

## 2019-11-11 ENCOUNTER — Ambulatory Visit: Payer: Medicare Other | Admitting: Family Medicine

## 2019-11-13 ENCOUNTER — Ambulatory Visit (INDEPENDENT_AMBULATORY_CARE_PROVIDER_SITE_OTHER): Payer: Medicare Other | Admitting: Licensed Clinical Social Worker

## 2019-11-13 DIAGNOSIS — I10 Essential (primary) hypertension: Secondary | ICD-10-CM | POA: Diagnosis not present

## 2019-11-13 DIAGNOSIS — E1165 Type 2 diabetes mellitus with hyperglycemia: Secondary | ICD-10-CM | POA: Diagnosis not present

## 2019-11-13 DIAGNOSIS — F411 Generalized anxiety disorder: Secondary | ICD-10-CM

## 2019-11-13 DIAGNOSIS — E782 Mixed hyperlipidemia: Secondary | ICD-10-CM

## 2019-11-13 DIAGNOSIS — J449 Chronic obstructive pulmonary disease, unspecified: Secondary | ICD-10-CM | POA: Diagnosis not present

## 2019-11-13 DIAGNOSIS — K219 Gastro-esophageal reflux disease without esophagitis: Secondary | ICD-10-CM

## 2019-11-13 NOTE — Patient Instructions (Addendum)
Licensed Clinical Education officer, museum Visit Information  Goals we discussed today:     Client will talk with LCSW in next 30 days to discuss anxiety, stress issues of client (pt-stated)         Current Barriers:   Anxiety issues in client with Chronic Diagnoses of GAD, COPD,DM Type 2, HLD, HTN, and GERD  Breathing issues  Irregular meal schedule  Clinical Social Work Clinical Goal(s):   Client will talk with LCSW in next 30 days to discus anxiety and stress issues of client  Interventions:   Encouraged Justin Ruiz and spouse to call RNCM to discuss nursing issues of client  Talked with Justin Ruiz about client panic episodes/anxiety symptoms  Talked with Justin Ruiz about mobility issues of client   Talked with Justin Ruiz about oxygen use of client  Talked with Justin Ruiz about client weakness, tiredness upon exersion  Talked with Justin Ruiz about client's upcoming appointments  Talked with Justin Ruiz about sleeping challenges of client  Talked with Justin Ruiz about relaxation techniques of client (client watches TV, [place] phone with family members)  Talked with Justin Ruiz about mood of client  Talked with Justin Ruiz about client completion of ADLs.  Talked with Justin Ruiz about weight of client  Talked with Justin Ruiz about occasional swelling in legs of client  Talked with Justin Ruiz about client managing diabetes  Patient Self Care Activities:   Attends scheduled medical appointments  Takes medications as prescribed  Plan:    Client to attend scheduled medical appointments LCSW to call client or spouse of client  in next 4 weeks to talk with client about his anxiety and stress symptoms management.  Client or spouse to talk with RNCM as needed to discuss nursing needs  Initial goal documentation    Follow Up Plan: LCSW to call client/spouse in next 4 weeks to talk with client/spouse about client's anxiety/stress symptoms management  Materials Provided: No  The patient/ Justin Ruiz, Justin Ruiz, verbalized understanding of instructions provided today and declined a print copy of patient instruction materials.   Justin Ruiz MSW, LCSW Licensed Clinical Social Worker [facility] Care Management [phone]

## 2019-11-13 NOTE — Chronic Care Management (AMB) (Signed)
Chronic Care Management    Clinical Social Work Follow Up Note  11/13/2019 Name: Justin Ruiz MRN: [id number] DOB: [birth year]  Justin Ruiz is a 69 y.o. year old male who is a primary care patient of Stacks, [name], MD. The CCM team was consulted for assistance with [employer] .   Review of patient status, including review of consultants reports, other relevant assessments, and collaboration with appropriate care team members and the patient's provider was performed as part of comprehensive patient evaluation and provision of chronic care management services.    SDOH (Social Determinants of Health) assessments performed: No;risk for social isolation; risk for stress;risk for physical inactivity    Chronic Care Management from 03/04/2019 in [place]  PHQ-9 Total Score 6       GAD 7 : Generalized Anxiety Score 03/04/2019  Nervous, Anxious, on Edge 1  Control/stop worrying 0  Worry too much - different things 0  Trouble relaxing 1  Restless 0  Easily annoyed or irritable 0  Afraid - awful might happen 1  Total GAD 7 Score 3  Anxiety Difficulty Somewhat difficult   Outpatient Encounter Medications as of 11/13/2019  Medication Sig Note  . acetaminophen (TYLENOL) 325 MG tablet Take 650 mg by mouth every 6 (six) hours as needed.   Marland Kitchen albuterol (VENTOLIN HFA) 108 (90 Base) MCG/ACT inhaler Inhale 2 puffs into the lungs every 4 (four) hours as needed for wheezing or shortness of breath.   . ALPRAZolam (XANAX) 0.25 MG tablet Take 0.25 mg by mouth 3 (three) times daily as needed for anxiety or sleep.    . bisoprolol (ZEBETA) 5 MG tablet Take 1 tablet (5 mg total) by mouth 2 (two) times daily.   . budesonide (PULMICORT) 0.25 MG/2ML nebulizer solution Take 2 mLs (0.25 mg total) by nebulization 2 (two) times daily.   Marland Kitchen COVID-19 mRNA vaccine, Moderna, 100 MCG/0.5ML SUSP Inject 0.5 mLs into the muscle once. 10/09/2019: 1st Injection 10/08/2019  . Dulaglutide  (TRULICITY) 4.5 XL/2.4MW SOPN Inject 4.5 mg as directed once a week.   Marland Kitchen ELIQUIS 5 MG TABS tablet Take 1 tablet by mouth twice daily (Patient taking differently: Take 5 mg by mouth 2 (two) times daily. )   . escitalopram (LEXAPRO) 20 MG tablet Take 1 tablet (20 mg total) by mouth daily.   . furosemide (LASIX) 40 MG tablet TAKE 1 TABLET BY MOUTH TWICE DAILY AS DIRECTED   . insulin NPH Human (NOVOLIN N) 100 UNIT/ML injection Use 40 units before Breakfast and 35 before supper   . insulin regular (NOVOLIN R) 100 units/mL injection Inject 10 units before breakfast and again before lunch. Inject 12 units before supper.   Marland Kitchen ipratropium-albuterol (DUONEB) 0.5-2.5 (3) MG/3ML SOLN USE 1 VIAL IN NEBULIZER EVERY 6 HOURS AS NEEDED (Patient taking differently: Inhale 3 mLs into the lungs every 4 (four) hours as needed (for shortness of breath/wheezing). )   . nystatin (MYCOSTATIN/NYSTOP) powder Apply topically 2 (two) times daily. Apply to groin area and scrotum skin; keep area clean and dry.   Marland Kitchen oxymetazoline (AFRIN) 0.05 % nasal spray Place 1 spray into both nostrils 2 (two) times daily as needed for congestion. 07/28/2019: On hand if needed  . simvastatin (ZOCOR) 40 MG tablet Take 1 tablet by mouth once daily (Patient taking differently: Take 40 mg by mouth at bedtime. )   . Tiotropium Bromide-Olodaterol (STIOLTO RESPIMAT) 2.5-2.5 MCG/ACT AERS Inhale 1 puff into the lungs daily.    . Vitamin D,  Cholecalciferol, 50 MCG (2000 UT) CAPS Take 1 capsule by mouth in the morning.     No facility-administered encounter medications on file as of 11/13/2019.    Goals    .  Client will talk with LCSW in next 30 days to discuss anxiety, stress issues of client (pt-stated)      Current Barriers:  . Anxiety issues in client with Chronic Diagnoses of GAD, COPD,DM Type 2, HLD, HTN, and GERD . Breathing issues . Irregular meal schedule  Clinical Social Work Clinical Goal(s):  . Client will talk with LCSW in next 30 days to  discus anxiety and stress issues of client  Interventions:   Encouraged Justin Ruiz and spouse to call RNCM to discuss nursing issues of client  Talked with Justin Ruiz about client panic episodes/anxiety symptoms  Talked with Justin Ruiz about mobility issues of client   Talked with Justin Ruiz about oxygen use of client  Talked with Justin Ruiz about client weakness, tiredness upon exersion  Talked with Justin Ruiz about client's upcoming appointments  Talked with Justin Ruiz about sleeping challenges of client  Talked with Justin Ruiz about relaxation techniques of client (client watches TV, [place] phone with family members)  Talked with Justin Ruiz about mood of client  Talked with Justin Ruiz about client completion of ADLs.  Talked with Justin Ruiz about weight of client  Talked with Justin Ruiz about occasional swelling in legs of client  Talked with Justin Ruiz about client managing diabetes  Patient Self Care Activities:  . Attends scheduled medical appointments . Takes medications as prescribed  Plan:    Client to attend scheduled medical appointments LCSW to call client or spouse of client  in next 4 weeks to talk with client about his anxiety and stress symptoms management.  Client or spouse to talk with RNCM as needed to discuss nursing needs  Initial goal documentation    Follow Up Plan: LCSW to call client/spouse in next 4 weeks to talk with client/spouse about client's anxiety/stress symptoms management  Norva Riffle.Gerold Sar MSW, LCSW Licensed Clinical Social Worker [facility] Care Management [phone]

## 2019-11-29 ENCOUNTER — Telehealth: Payer: Self-pay | Admitting: Internal Medicine

## 2019-11-29 ENCOUNTER — Ambulatory Visit: Payer: Medicare Other | Admitting: Pulmonary Disease

## 2019-11-29 ENCOUNTER — Other Ambulatory Visit: Payer: Self-pay

## 2019-11-29 ENCOUNTER — Encounter: Payer: Self-pay | Admitting: Pulmonary Disease

## 2019-11-29 VITALS — BP 130/66 | HR 102 | Ht 69.0 in | Wt 289.2 lb

## 2019-11-29 DIAGNOSIS — J449 Chronic obstructive pulmonary disease, unspecified: Secondary | ICD-10-CM | POA: Diagnosis not present

## 2019-11-29 DIAGNOSIS — J9611 Chronic respiratory failure with hypoxia: Secondary | ICD-10-CM | POA: Diagnosis not present

## 2019-11-29 MED ORDER — IPRATROPIUM-ALBUTEROL 0.5-2.5 (3) MG/3ML IN SOLN: RESPIRATORY_TRACT | 1 refills | Status: DC

## 2019-11-29 NOTE — Patient Instructions (Signed)
-   Take 2 inhalations of Stiolto daily - Take budesonide nebulizer twice daily - Use duoneb nebulizer as needed every 4-6 hours - We will refer you to Palliative Care to discuss options on how to manage symptoms of anxiety and shortness of breath

## 2019-11-29 NOTE — Telephone Encounter (Signed)
Scheduling [facility] Palliative visit for 12-12-19 at 8:30.

## 2019-12-02 ENCOUNTER — Telehealth: Payer: Medicare Other | Admitting: *Deleted

## 2019-12-02 ENCOUNTER — Ambulatory Visit: Payer: Medicare Other | Admitting: Pulmonary Disease

## 2019-12-02 ENCOUNTER — Telehealth: Payer: Self-pay | Admitting: *Deleted

## 2019-12-02 NOTE — Progress Notes (Signed)
Patient ID: Justin Ruiz, male    DOB: 05-Jul-1950, 69 y.o.   MRN: [id number]  Chief Complaint  Patient presents with  . Acute Visit    Dr. Annamaria Boots pt being treated for COPD, feels like he is having worsening SOB with any exertion, nonprod cough.  Denies PND, mucus production, CP, chest tightness.     Referring provider: [name], MD  HPI: Justin Ruiz is a 69 year old male, former smoker with COPD (FEV1: 22%) , obstructive sleep apnea, seasonal allergic rhinitis, acute on chronic hypoxemic respiratory failure on 3-4L home O2, hypertension, mediastinal adenopathy, history of non-hodgkin's lymphoma, A. Fib (on Eliquis), chronic diastolic heart failure, obesity, GERD, benign neoplasm of ascending colon, and diabetes mellitus type 2 who presents to pulmonary clinic for an acute visit for progressive dyspnea.   He is accompanied by his wife. They report he has been having worsening dyspnea on exertion and is very limited to moving around his home. He reports having increased wheezing. He denies increase in sputum production and cough. He reports using his duonebs every 2 hours recently. He is taking 1 puff of stiolto daily and budesonide nebulizers twice daily. He reports an increase in anxiety which also makes him feel more short of breath. He has not been using his CPAP at night as he feels he is smothering.   He was recently admitted 10/09/19 for atrial fibrillation with RVR and hyperglycemia. He has had 3 other hospitalizations this year for shortness of breath and respiratory failure.   Allergies  Allergen Reactions  . Avapro [Irbesartan] Other (See Comments)    "doesnt sit right with me"--light headed  . Lipitor [Atorvastatin Calcium] Other (See Comments)    Leg pain    Immunization History  Administered Date(s) Administered  . Fluad Quad(high Dose 65+) 01/21/2019  . Influenza Split 01/09/2012  . Influenza Whole 01/23/2009, 01/24/2011  . Influenza, High Dose Seasonal PF  02/01/2016, 01/24/2017, 01/29/2018  . Influenza,inj,Quad PF,6+ Mos 01/30/2013, 01/24/2014, 01/21/2015  . Moderna SARS-COVID-2 Vaccination 10/31/2019  . Pneumococcal Conjugate-13 02/20/2014  . Pneumococcal Polysaccharide-23 02/24/2011  . Td 04/25/2004  . Tdap 09/24/2010    Past Medical History:  Diagnosis Date  . Acute diastolic heart failure ([place]) 03/28/2016  . Acute encephalopathy 03/28/2016  . Acute encephalopathy 03/28/2016  . Acute on chronic respiratory failure with hypoxia ([place]) 12/04/2016  . Arthritis   . Asthma   . Atrial fibrillation ([place])    on AC  . Benign neoplasm of ascending colon 05/05/2014  . BPH (benign prostatic hypertrophy)   . CARDIOVASCULAR STUDIES, ABNORMAL 04/27/2009   Qualifier: Diagnosis of  By: Percival Spanish, MD, Farrel Gordon    . Chronic anticoagulation 08/20/2012  . Chronic respiratory failure with hypoxia ([place]) 11/10/2017  . Colon polyps   . COPD mixed type ([place]) 03/28/2016   PFT 06/17/14- severe obstructive airways disease with slight response to bronchodilator. FVC 2.03/42%, FEV1 1.02/28%, ratio 0.5, TLC 82%, DLCO 59%  . Diabetes mellitus   . Diabetic neuropathy, type II diabetes mellitus ([place]) 09/25/2014  . Diabetic retinopathy ([place]) 05/28/2017  . 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 GERD (gastroesophageal reflux disease)   . HTN (hypertension)    x 3 years  . Hyperlipidemia   . Lung nodules 03/11/2013  . Mediastinal adenopathy 06/20/2013   CT  & PET 2/15   . Memory loss   . 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 NHL (non-Hodgkin's lymphoma) ([place])  nhl dx 9/11  . Obesity    exogenous  . Obstructive sleep apnea 05/25/2014   NPSG-  04/07/2014-severe obstructive sleep apnea-AHI 75.1 per hour. CPAP titrated to 16. He required supplemental oxygen at 2 L/ [employer] which he already wears for sleep at home. Weight 338 pounds   . Peripheral vascular insufficiency ([place]) 08/02/2017  . Personal history of  colonic polyps 03/02/2009   Qualifier: Diagnosis of  By: Deatra Ina MD, Sandy Salaam   . Sepsis ([place]) 06/07/2015  . Severe obesity (BMI >= 40) ([place]) 05/01/2013  . Shingles   . Sleep apnea    CPAP machine is broken  . Thrombocytopenia ([place]) 11/23/2016    Tobacco History: Social History   Tobacco Use  Smoking Status Former Smoker  . Packs/day: 1.00  . Years: 40.00  . Pack years: 40.00  . Types: Cigarettes  . Start date: 12/17/1968  . Quit date: 10/18/2018  . Years since quitting: 1.1  Smokeless Tobacco Never Used   Counseling given: Not Answered   Outpatient Medications Prior to Visit  Medication Sig Dispense Refill  . acetaminophen (TYLENOL) 325 MG tablet Take 650 mg by mouth every 6 (six) hours as needed.    Marland Kitchen albuterol (VENTOLIN HFA) 108 (90 Base) MCG/ACT inhaler Inhale 2 puffs into the lungs every 4 (four) hours as needed for wheezing or shortness of breath.    . ALPRAZolam (XANAX) 0.25 MG tablet Take 0.25 mg by mouth 3 (three) times daily as needed for anxiety or sleep.     . bisoprolol (ZEBETA) 5 MG tablet Take 1 tablet (5 mg total) by mouth 2 (two) times daily. 60 tablet 3  . budesonide (PULMICORT) 0.25 MG/2ML nebulizer solution Take 2 mLs (0.25 mg total) by nebulization 2 (two) times daily. 120 mL 12  . Dulaglutide (TRULICITY) 4.5 SH/7.69YO SOPN Inject 4.5 mg as directed once a week. 2 pen 0  . ELIQUIS 5 MG TABS tablet Take 1 tablet by mouth twice daily (Patient taking differently: Take 5 mg by mouth 2 (two) times daily. ) 60 tablet 2  . escitalopram (LEXAPRO) 20 MG tablet Take 1 tablet (20 mg total) by mouth daily. 30 tablet 2  . furosemide (LASIX) 40 MG tablet TAKE 1 TABLET BY MOUTH TWICE DAILY AS DIRECTED 180 tablet 0  . insulin NPH Human (NOVOLIN N) 100 UNIT/ML injection Use 40 units before Breakfast and 35 before supper 30 mL 11  . insulin regular (NOVOLIN R) 100 units/mL injection Inject 10 units before breakfast and again before lunch. Inject 12 units before supper. 10 mL 5  .  nystatin (MYCOSTATIN/NYSTOP) powder Apply topically 2 (two) times daily. Apply to groin area and scrotum skin; keep area clean and dry. 15 g 0  . oxymetazoline (AFRIN) 0.05 % nasal spray Place 1 spray into both nostrils 2 (two) times daily as needed for congestion.    . simvastatin (ZOCOR) 40 MG tablet Take 1 tablet by mouth once daily (Patient taking differently: Take 40 mg by mouth at bedtime. ) 90 tablet 1  . Tiotropium Bromide-Olodaterol (STIOLTO RESPIMAT) 2.5-2.5 MCG/ACT AERS Inhale 1 puff into the lungs daily.     . Vitamin D, Cholecalciferol, 50 MCG (2000 UT) CAPS Take 1 capsule by mouth in the morning.     Marland Kitchen ipratropium-albuterol (DUONEB) 0.5-2.5 (3) MG/3ML SOLN USE 1 VIAL IN NEBULIZER EVERY 6 HOURS AS NEEDED (Patient taking differently: Inhale 3 mLs into the lungs every 4 (four) hours as needed (for shortness of breath/wheezing). ) 1080 mL 1  .  COVID-19 mRNA vaccine, Moderna, 100 MCG/0.5ML SUSP Inject 0.5 mLs into the muscle once.     No facility-administered medications prior to visit.    Review of Systems  Constitutional: Negative for chills, diaphoresis, fever and weight loss.  HENT: Negative for congestion, nosebleeds, sinus pain and sore throat.   Eyes: Negative for blurred vision.  Respiratory: Positive for shortness of breath and wheezing. Negative for cough, hemoptysis and sputum production.   Cardiovascular: Positive for orthopnea and leg swelling. Negative for chest pain, palpitations and claudication.  Gastrointestinal: Negative for abdominal pain, blood in stool, constipation, diarrhea, heartburn, nausea and vomiting.  Genitourinary: Negative for dysuria and hematuria.  Musculoskeletal: Negative for falls, joint pain and myalgias.  Skin: Negative for itching and rash.  Neurological: Negative for dizziness, tremors, focal weakness and headaches.  Psychiatric/Behavioral: Positive for depression. Negative for substance abuse and suicidal ideas. The patient is nervous/anxious.  The patient does not have insomnia.      Physical Activity: Inactive  . Days of Exercise per Week: 0 days  . Minutes of Exercise per Session: 0 min   Physical Exam  BP 130/66 (BP Location: Left Arm, Cuff Size: Normal)   Pulse (!) 102   Ht 5\' 9"  (1.753 m)   Wt 289 lb 3.2 oz (131.2 kg)   SpO2 95%   BMI 42.71 kg/m  Physical Exam Constitutional:      General: He is not in acute distress.    Appearance: He is obese. He is ill-appearing (chronically).  HENT:     Head: Normocephalic and atraumatic.     Nose: No congestion or rhinorrhea.     Mouth/Throat:     Mouth: Mucous membranes are moist.     Pharynx: Oropharynx is clear.  Eyes:     General: No scleral icterus.    Conjunctiva/sclera: Conjunctivae normal.  Cardiovascular:     Rate and Rhythm: Normal rate and regular rhythm.     Pulses: Normal pulses.     Heart sounds: Normal heart sounds. No murmur heard.   Pulmonary:     Effort: Pulmonary effort is normal. No respiratory distress.     Breath sounds: Rales (mild bibasilar) present. No wheezing or rhonchi.  Abdominal:     General: Abdomen is flat. Bowel sounds are normal. There is no distension.     Palpations: Abdomen is soft.  Musculoskeletal:     Right lower leg: Edema present.     Left lower leg: Edema present.  Lymphadenopathy:     Cervical: No cervical adenopathy.  Skin:    General: Skin is warm and dry.     Coloration: Skin is not jaundiced.  Neurological:     General: No focal deficit present.     Mental Status: He is alert and oriented to person, place, and time.  Psychiatric:        Mood and Affect: Mood normal.        Behavior: Behavior normal.        Thought Content: Thought content normal.        Judgment: Judgment normal.     Lab Results:  CBC    Component Value Date/Time   WBC 6.7 10/09/2019 1719   RBC 4.32 10/09/2019 1719   HGB 12.7 (L) 10/09/2019 1719   HGB 14.8 08/08/2019 1414   HGB 15.2 05/19/2016 0933   HCT 39.0 10/09/2019 1719    HCT 44.1 08/08/2019 1414   HCT 44.9 05/19/2016 0933   PLT 209 10/09/2019 1719   PLT 206  08/08/2019 1414   MCV 90.3 10/09/2019 1719   MCV 88 08/08/2019 1414   MCV 87.2 05/19/2016 0933   MCH 29.4 10/09/2019 1719   MCHC 32.6 10/09/2019 1719   RDW 14.5 10/09/2019 1719   RDW 13.3 08/08/2019 1414   RDW 14.2 05/19/2016 0933   LYMPHSABS 0.8 10/09/2019 1719   LYMPHSABS 1.4 08/08/2019 1414   LYMPHSABS 2.1 05/19/2016 0933   MONOABS 0.4 10/09/2019 1719   MONOABS 0.8 05/19/2016 0933   EOSABS 0.1 10/09/2019 1719   EOSABS 0.1 08/08/2019 1414   BASOSABS 0.1 10/09/2019 1719   BASOSABS 0.1 08/08/2019 1414   BASOSABS 0.0 05/19/2016 0933    BMET    Component Value Date/Time   NA 136 10/09/2019 2158   NA 135 08/08/2019 1414   NA 139 05/19/2016 0933   K 4.5 10/09/2019 2158   K 4.0 05/19/2016 0933   CL 97 (L) 10/09/2019 2158   CL 100 09/03/2012 0751   CO2 30 10/09/2019 2158   CO2 32 (H) 05/19/2016 0933   GLUCOSE 298 (H) 10/09/2019 2158   GLUCOSE 128 05/19/2016 0933   GLUCOSE 202 (H) 09/03/2012 0751   BUN 30 (H) 10/09/2019 2158   BUN 32 (H) 08/08/2019 1414   BUN 25.3 05/19/2016 0933   CREATININE 1.29 (H) 10/09/2019 2158   CREATININE 1.27 (H) 06/21/2018 0950   CREATININE 1.2 05/19/2016 0933   CALCIUM 9.8 10/09/2019 2158   CALCIUM 10.4 05/19/2016 0933   GFRNONAA 57 (L) 10/09/2019 2158   GFRNONAA 58 (L) 06/21/2018 0950   GFRNONAA >89 11/14/2012 0918   GFRAA >60 10/09/2019 2158   GFRAA >60 06/21/2018 0950   GFRAA >89 11/14/2012 0918    BNP    Component Value Date/Time   BNP 302.0 (H) 10/09/2019 1719    ProBNP    Component Value Date/Time   PROBNP 186.0 (H) 01/09/2012 1059    PFT Results Latest Ref Rng & Units 11/10/2017 06/17/2014  FVC-Pre L 1.18 1.75  FVC-Predicted Pre % 25 36  FVC-Post L 1.65 2.03  FVC-Predicted Post % 35 42  Pre FEV1/FVC % % 48 50  Post FEV1/FCV % % 48 50  FEV1-Pre L 0.56 0.87  FEV1-Predicted Pre % 16 24  FEV1-Post L 0.79 1.02  DLCO uncorrected  ml/min/mmHg 9.70 19.57  DLCO UNC% % 29 59  DLCO corrected ml/min/mmHg 9.84 -  DLCO COR %Predicted % 29 -  DLVA Predicted % 49 93  TLC L 7.43 5.85  TLC % Predicted % 104 82  RV % Predicted % 252 158    Assessment & Plan:  Justin Ruiz is a 69 year old male, former smoker with COPD (FEV1: 22%) , obstructive sleep apnea, seasonal allergic rhinitis, acute on chronic hypoxemic respiratory failure on 3-4L home O2, hypertension, mediastinal adenopathy, history of non-hodgkin's lymphoma, A. Fib (on Eliquis), chronic diastolic heart failure, obesity, GERD, benign neoplasm of ascending colon, and diabetes mellitus type 2 who presents to pulmonary clinic for an acute visit for progressive dyspnea.   1. COPD Severe Obstructive defect, FEV1 22%, GOLD Group D - He has had multiple hospitalizations this year for his COPD and respiratory status and he continues to show progressive symptoms with worsening dyspnea with further limitation to his daily function. We discussed a referral to palliative care with the patient and his wife and they were in agreement for further symptomatic treatment for the dyspnea and anxiety. We discussed the long term outlook for his condition (18% estimated 4 year survival based on BODE index)  and to discuss code status/resuscitative care wishes in the near future. - He is continue budesonide nebulizer twice daily, do 2 inhalations of stiolto daily and use the duoneb nebulizer as needed every 4-6 hours.  - We discussed how more frequent use of the duonebs can lead to increased heart rate and anxiety as side effects of too much albuterol  2. Obstructive sleep apnea - Severe obstructive sleep apnea, not currently using CPAP  - Compliant with nocturnal oxygen   Patient has scheduled follow up with Dr. Annamaria Boots in September. He is to keep this visit in case his symptoms should worsen, but can always push the visit out 1-2 months if his symptoms are improved with the palliative care  consult.  Freddi Starr, MD 12/02/2019

## 2019-12-02 NOTE — Telephone Encounter (Signed)
  Chronic Care Management   Outreach Note  12/02/2019 Name: Justin Ruiz MRN: [id number] DOB: [birth year]  Referred by: [name], MD Reason for referral : Chronic Care Management   An unsuccessful telephone follow-up was attempted today. The patient was referred to the case management team for assistance with care management and care coordination.   Follow Up Plan: A HIPAA compliant phone message was left for the patient's wife, Justin Ruiz, providing contact information and requesting a return call.  The care management team will reach out to the patient again over the next 10 days.   [name], BSN, RN-BC Embedded Chronic Care Manager [facility] Direct Dial: [phone]

## 2019-12-03 ENCOUNTER — Ambulatory Visit (INDEPENDENT_AMBULATORY_CARE_PROVIDER_SITE_OTHER): Payer: Medicare Other | Admitting: Family Medicine

## 2019-12-03 ENCOUNTER — Other Ambulatory Visit: Payer: Self-pay

## 2019-12-03 ENCOUNTER — Encounter: Payer: Self-pay | Admitting: Family Medicine

## 2019-12-03 VITALS — BP 134/70 | HR 116 | Temp 98.0°F | Resp 22 | Ht 69.0 in | Wt 279.0 lb

## 2019-12-03 DIAGNOSIS — I10 Essential (primary) hypertension: Secondary | ICD-10-CM | POA: Diagnosis not present

## 2019-12-03 DIAGNOSIS — E1165 Type 2 diabetes mellitus with hyperglycemia: Secondary | ICD-10-CM | POA: Diagnosis not present

## 2019-12-03 DIAGNOSIS — Z794 Long term (current) use of insulin: Secondary | ICD-10-CM

## 2019-12-03 DIAGNOSIS — E782 Mixed hyperlipidemia: Secondary | ICD-10-CM | POA: Diagnosis not present

## 2019-12-03 DIAGNOSIS — E11319 Type 2 diabetes mellitus with unspecified diabetic retinopathy without macular edema: Secondary | ICD-10-CM

## 2019-12-03 DIAGNOSIS — E119 Type 2 diabetes mellitus without complications: Secondary | ICD-10-CM

## 2019-12-03 DIAGNOSIS — K219 Gastro-esophageal reflux disease without esophagitis: Secondary | ICD-10-CM

## 2019-12-03 LAB — GLUCOSE HEMOCUE WAIVED: Glu Hemocue Waived: 42 mg/dL — ABNORMAL LOW (ref 65–99)

## 2019-12-03 LAB — BAYER DCA HB A1C WAIVED: HB A1C (BAYER DCA - WAIVED): 10.5 % — ABNORMAL HIGH (ref <7.0)

## 2019-12-03 MED ORDER — INSULIN NPH (HUMAN) (ISOPHANE) 100 UNIT/ML ~~LOC~~ SUSP: SUBCUTANEOUS | 11 refills | Status: AC

## 2019-12-03 NOTE — Progress Notes (Signed)
Subjective:  Patient ID: Justin Ruiz, male    DOB: Jul 14, 1950  Age: 69 y.o. MRN: [id number]  CC: Follow-up   HPI [name] presents for recheck of diabetes. Glucose running 190+. It was over 300 this AM. He is seeing pulmonary for his COPD. A new pulmonologist recommended palliative care for him.    in for follow-up of elevated cholesterol. Doing well without complaints on current medication. Denies side effects of statin including myalgia and arthralgia and nausea. Currently no chest pain, shortness of breath or other cardiovascular related symptoms noted.   Depression screen [facility] 2/9 12/03/2019 10/21/2019 10/16/2019  Decreased Interest 0 0 0  Down, Depressed, Hopeless 0 0 0  PHQ - 2 Score 0 0 0  Altered sleeping - - -  Tired, decreased energy - - -  Change in appetite - - -  Feeling bad or failure about yourself  - - -  Trouble concentrating - - -  Moving slowly or fidgety/restless - - -  Suicidal thoughts - - -  PHQ-9 Score - - -  Difficult doing work/chores - - -  Some recent data might be hidden    History Keymari has a past medical history of Acute diastolic heart failure ([place]) (03/28/2016), Acute encephalopathy (03/28/2016), Acute encephalopathy (03/28/2016), Acute on chronic respiratory failure with hypoxia ([place]) (12/04/2016), Arthritis, Asthma, Atrial fibrillation ([place]), Benign neoplasm of ascending colon (05/05/2014), BPH (benign prostatic hypertrophy), CARDIOVASCULAR STUDIES, ABNORMAL (04/27/2009), Chronic anticoagulation (08/20/2012), Chronic respiratory failure with hypoxia ([place]) (11/10/2017), Colon polyps, COPD mixed type ([place]) (03/28/2016), Diabetes mellitus, Diabetic neuropathy, type II diabetes mellitus ([place]) (09/25/2014), Diabetic retinopathy ([place]) (05/28/2017), 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   He has a past surgical history that includes Appendectomy and Colonoscopy with propofol (N/A, 05/05/2014).   His family history includes Aortic aneurysm in his sister; COPD in his sister and sister; Colon cancer in his father; Colon polyps in his father; Diabetes in his maternal grandmother, mother, and sister; Heart attack in his sister; Heart disease in his mother, sister, and sister; Liver cancer in his mother; Prostate cancer in his father; Pulmonary embolism in his sister.He reports that he quit smoking about 13 months ago. His smoking use included cigarettes. He started smoking about 51 years ago. He has a 40.00 pack-year smoking history. He has never used smokeless tobacco. He reports that he does not drink alcohol and does not use drugs.    ROS Review of Systems  Constitutional: Positive for fatigue. Negative for fever.  Respiratory: Positive for shortness of breath.   Cardiovascular: Negative for chest pain.  Musculoskeletal: Negative for arthralgias.  Skin: Negative for rash.    Objective:  BP 134/70   Pulse (!) 116   Temp 98 F (36.7 C) (Temporal)   Resp (!) 22   Ht '5\' 9"'  (1.753 m)   Wt 279 lb (126.6 kg)   SpO2 97%   BMI 41.20 kg/m   BP Readings from Last 3 Encounters:  12/03/19 134/70  11/29/19 130/66  10/21/19 100/60    Wt Readings from Last 3 Encounters:  12/03/19 279 lb (126.6 kg)  11/29/19 289  lb 3.2 oz (131.2 kg)  10/21/19 289 lb 6.4 oz (131.3 kg)     Physical Exam Vitals reviewed.  Constitutional:      Appearance: He is well-developed.  HENT:     Head:  Normocephalic and atraumatic.     Right Ear: Tympanic membrane and external ear normal. No decreased hearing noted.     Left Ear: Tympanic membrane and external ear normal. No decreased hearing noted.     Mouth/Throat:     Pharynx: No oropharyngeal exudate or posterior oropharyngeal erythema.  Eyes:     Pupils: Pupils are equal, round, and reactive to light.  Cardiovascular:     Rate and Rhythm: Normal rate and regular rhythm.     Heart sounds: No murmur heard.   Pulmonary:     Effort: No respiratory distress.     Breath sounds: Wheezing and rhonchi present.     Comments: Decreased sounds  Abdominal:     General: Bowel sounds are normal.     Palpations: Abdomen is soft. There is no mass.     Tenderness: There is no abdominal tenderness.  Musculoskeletal:     Cervical back: Normal range of motion and neck supple.       Assessment & Plan:   [place] was seen today for follow-up.  Diagnoses and all orders for this visit:  Uncontrolled type 2 diabetes mellitus with hyperglycemia ([place]) -     CMP14+EGFR -     CBC with Differential/Platelet -     Microalbumin / creatinine urine ratio -     Glucose Hemocue Waived -     Bayer DCA Hb A1c Waived  Mixed hyperlipidemia -     CMP14+EGFR -     CBC with Differential/Platelet  Essential hypertension, benign -     CMP14+EGFR -     CBC with Differential/Platelet  Gastroesophageal reflux disease, unspecified whether esophagitis present -     CMP14+EGFR -     CBC with Differential/Platelet  Diabetes mellitus type 2, insulin dependent (HCC) -     CMP14+EGFR -     CBC with Differential/Platelet -     Microalbumin / creatinine urine ratio -     Glucose Hemocue Waived -     Bayer DCA Hb A1c Waived  Diabetic retinopathy of both eyes associated with type 2 diabetes mellitus, macular edema presence unspecified, unspecified retinopathy severity (HCC) -     insulin NPH Human (NOVOLIN N) 100 UNIT/ML injection; Use 50 units before Breakfast  and 40 before supper       I have changed Konnar B. Ashby's insulin NPH Human. I am also having him maintain his Eliquis, simvastatin, acetaminophen, nystatin, oxymetazoline, albuterol, escitalopram, Vitamin D (Cholecalciferol), ALPRAZolam, budesonide, Stiolto Respimat, bisoprolol, Trulicity, insulin regular, furosemide, and ipratropium-albuterol.  Allergies as of 12/03/2019      Reactions   Avapro [irbesartan] Other (See Comments)   "doesnt sit right with me"--light headed   Lipitor [atorvastatin Calcium] Other (See Comments)   Leg pain      Medication List       Accurate as of December 03, 2019 11:59 PM. If you have any questions, ask your nurse or doctor.        acetaminophen 325 MG tablet Commonly known as: TYLENOL Take 650 mg by mouth every 6 (six) hours as needed.   albuterol 108 (90 Base) MCG/ACT inhaler Commonly known as: VENTOLIN HFA Inhale 2 puffs into the lungs every 4 (four) hours as needed for wheezing or shortness of  breath.   ALPRAZolam 0.25 MG tablet Commonly known as: XANAX Take 0.25 mg by mouth 3 (three) times daily as needed for anxiety or sleep.   bisoprolol 5 MG tablet Commonly known as: ZEBETA Take 1 tablet (5 mg total) by mouth 2 (two) times daily.   budesonide 0.25 MG/2ML nebulizer solution Commonly known as: PULMICORT Take 2 mLs (0.25 mg total) by nebulization 2 (two) times daily.   Eliquis 5 MG Tabs tablet Generic drug: apixaban Take 1 tablet by mouth twice daily What changed: how much to take   escitalopram 20 MG tablet Commonly known as: LEXAPRO Take 1 tablet (20 mg total) by mouth daily.   furosemide 40 MG tablet Commonly known as: LASIX TAKE 1 TABLET BY MOUTH TWICE DAILY AS DIRECTED   insulin NPH Human 100 UNIT/ML injection Commonly known as: NovoLIN N Use 50 units before Breakfast and 40 before supper What changed: additional instructions Changed by: [name], MD   insulin regular 100 units/mL injection Commonly known  as: NOVOLIN R Inject 10 units before breakfast and again before lunch. Inject 12 units before supper.   ipratropium-albuterol 0.5-2.5 (3) MG/3ML Soln Commonly known as: DUONEB USE 1 VIAL IN NEBULIZER EVERY 6 HOURS AS NEEDED   nystatin powder Commonly known as: MYCOSTATIN/NYSTOP Apply topically 2 (two) times daily. Apply to groin area and scrotum skin; keep area clean and dry.   oxymetazoline 0.05 % nasal spray Commonly known as: AFRIN Place 1 spray into both nostrils 2 (two) times daily as needed for congestion.   simvastatin 40 MG tablet Commonly known as: ZOCOR Take 1 tablet by mouth once daily What changed: when to take this   Stiolto Respimat 2.5-2.5 MCG/ACT Aers Generic drug: Tiotropium Bromide-Olodaterol Inhale 2 puffs into the lungs daily.   Trulicity 4.5 GC/6.69YO Sopn Generic drug: Dulaglutide Inject 4.5 mg as directed once a week.   Vitamin D (Cholecalciferol) 50 MCG (2000 UT) Caps Take 1 capsule by mouth in the morning.        Follow-up: No follow-ups on file.  [name], M.D.

## 2019-12-04 ENCOUNTER — Ambulatory Visit: Payer: Medicare Other | Admitting: Adult Health

## 2019-12-04 LAB — MICROALBUMIN / CREATININE URINE RATIO
Creatinine, Urine: 63.9 mg/dL
Microalb/Creat Ratio: 97 mg/g creat — ABNORMAL HIGH (ref 0–29)
Microalbumin, Urine: 62.2 ug/mL

## 2019-12-04 LAB — CBC WITH DIFFERENTIAL/PLATELET
Basophils Absolute: 0.1 10*3/uL (ref 0.0–0.2)
Basos: 1 %
EOS (ABSOLUTE): 0.1 10*3/uL (ref 0.0–0.4)
Eos: 1 %
Hematocrit: 41.7 % (ref 37.5–51.0)
Hemoglobin: 13.3 g/dL (ref 13.0–17.7)
Immature Grans (Abs): 0 10*3/uL (ref 0.0–0.1)
Immature Granulocytes: 0 %
Lymphocytes Absolute: 1.6 10*3/uL (ref 0.7–3.1)
Lymphs: 19 %
MCH: 28.9 pg (ref 26.6–33.0)
MCHC: 31.9 g/dL (ref 31.5–35.7)
MCV: 91 fL (ref 79–97)
Monocytes Absolute: 0.7 10*3/uL (ref 0.1–0.9)
Monocytes: 8 %
Neutrophils Absolute: 6 10*3/uL (ref 1.4–7.0)
Neutrophils: 71 %
Platelets: 249 10*3/uL (ref 150–450)
RBC: 4.61 x10E6/uL (ref 4.14–5.80)
RDW: 13.2 % (ref 11.6–15.4)
WBC: 8.5 10*3/uL (ref 3.4–10.8)

## 2019-12-04 LAB — CMP14+EGFR
ALT: 12 IU/L (ref 0–44)
AST: 17 IU/L (ref 0–40)
Albumin/Globulin Ratio: 1.3 (ref 1.2–2.2)
Albumin: 4.2 g/dL (ref 3.8–4.8)
Alkaline Phosphatase: 101 IU/L (ref 48–121)
BUN/Creatinine Ratio: 19 (ref 10–24)
BUN: 26 mg/dL (ref 8–27)
Bilirubin Total: 0.8 mg/dL (ref 0.0–1.2)
CO2: 28 mmol/L (ref 20–29)
Calcium: 10.2 mg/dL (ref 8.6–10.2)
Chloride: 93 mmol/L — ABNORMAL LOW (ref 96–106)
Creatinine, Ser: 1.34 mg/dL — ABNORMAL HIGH (ref 0.76–1.27)
GFR calc Af Amer: 62 mL/min/{1.73_m2} (ref >59)
GFR calc non Af Amer: 54 mL/min/{1.73_m2} — ABNORMAL LOW (ref >59)
Globulin, Total: 3.3 g/dL (ref 1.5–4.5)
Glucose: 50 mg/dL — ABNORMAL LOW (ref 65–99)
Potassium: 3.9 mmol/L (ref 3.5–5.2)
Sodium: 140 mmol/L (ref 134–144)
Total Protein: 7.5 g/dL (ref 6.0–8.5)

## 2019-12-05 DIAGNOSIS — I872 Venous insufficiency (chronic) (peripheral): Secondary | ICD-10-CM | POA: Diagnosis not present

## 2019-12-05 DIAGNOSIS — M6281 Muscle weakness (generalized): Secondary | ICD-10-CM | POA: Diagnosis not present

## 2019-12-05 DIAGNOSIS — J9611 Chronic respiratory failure with hypoxia: Secondary | ICD-10-CM | POA: Diagnosis not present

## 2019-12-05 DIAGNOSIS — I509 Heart failure, unspecified: Secondary | ICD-10-CM | POA: Diagnosis not present

## 2019-12-06 ENCOUNTER — Telehealth: Payer: Self-pay | Admitting: Internal Medicine

## 2019-12-06 NOTE — Telephone Encounter (Signed)
Called and spoke with patient's wife to let her know that at this time Dr. Annamaria Boots did not have any sooner appointments for patient. Let her know that I would send message to Dr. Annamaria Boots to see if there was any available options to move him up sooner. She expressed understanding.  Dr. Annamaria Boots would you be ok with [place] moving patient up to one of your RNA slots?  See message below from patient  Patient wife wants patient to be seen before 01/01/20. Patient's wife states patient yells "I can't breathe, I'm not getting oxygen in my lungs."

## 2019-12-06 NOTE — Telephone Encounter (Signed)
Ok for an app to see hm sooner than I can

## 2019-12-07 DIAGNOSIS — J9611 Chronic respiratory failure with hypoxia: Secondary | ICD-10-CM | POA: Diagnosis not present

## 2019-12-09 NOTE — Telephone Encounter (Signed)
Spoke with pt's wife. She states that she does not want pt to see a NP and she will just keep pt's appt with Dr Annamaria Boots on 01/01/20 and if pt gets worse she states she will take him to the ER. Nothing further needed at this time.

## 2019-12-10 DIAGNOSIS — J449 Chronic obstructive pulmonary disease, unspecified: Secondary | ICD-10-CM | POA: Diagnosis not present

## 2019-12-10 NOTE — Telephone Encounter (Signed)
Pt has been r/s for 01/22/2020

## 2019-12-12 ENCOUNTER — Other Ambulatory Visit: Payer: Self-pay

## 2019-12-12 ENCOUNTER — Other Ambulatory Visit: Payer: Medicare Other | Admitting: Hospice

## 2019-12-12 DIAGNOSIS — J449 Chronic obstructive pulmonary disease, unspecified: Secondary | ICD-10-CM

## 2019-12-12 DIAGNOSIS — Z515 Encounter for palliative care: Secondary | ICD-10-CM | POA: Diagnosis not present

## 2019-12-12 NOTE — Progress Notes (Signed)
[facility] Consult Note Telephone: [phone]  Fax: [phone]  PATIENT NAME: Justin Ruiz DOB: December 27, 1950 MRN: [id number]  PRIMARY CARE PROVIDER:   [name], MD  REFERRING PROVIDER: [name], MD  RESPONSIBLE PARTY:  Self Contact spouse: [name] [phone]    RECOMMENDATIONS/PLAN:   Advance Care Planning/Goals of Care: Visit at the request of Dr. Freda Ruiz for palliative consult. Visit consisted of building trust and discussions on Palliative Medicine as specialized medical care for people living with serious illness, aimed at facilitating better quality of life through symptoms relief, assisting with advance care plan and establishing goals of care. Spouse and grand daughter during present at visit.  This is a joint visit of this NP and Justin Gambler NP with patient and family.  Family expressed appreciation for education provided on Palliative care and how that differs from Hospice service. Palliative care will continue to provide support to patient, family and the medical team.  Code Status: After discussions on ramifications and implications of CODE STATUS patient elected to be a full Code. Patient reiterated desire for full spectrum of care including mechanical ventilation.  Validation provided.  It was also explained to patient that [place] will be regularly reviewed to ensure that it reflects patient wishes.  Patient and family open to further discussion on CODE STATUS and possible changes in the future. Goals of Care: Goals of care include to maximize quality of life and symptom management. Patient want to live as long as he can possibility live.  Follow up: Palliative care will continue to follow patient for goals of care clarification and symptom management.Follow up scheduled in a month by Justin Gambler NP Symptom management:  Patient having increasing difficulty with SOB at rest and mild activity. His oxygen  saturation drops to the 80s with activity. He is currently sitting in his recliner with no evidence of acute distress, oxygen saturation 94% on 4L. He is unable to walk to his mail box, able to take shower and dress himself independently.  Education provided on taking rest in between activities, proper breathing techniques and adherence to breathing treatments as ordered.  Spouse affirmed that patient is compliant with his medications.  Type 2 diabetes mellitus is currently managed with insulin and Trulicity.  Patient continues on furosemide for CHF.  He denied shortness of breath, no coughing, in no medical acuity.  NP encouraged ongoing care. I spent 1 hour and 16 minutes providing this consultation; time iincludes time spent with patient/family, chart review, provider coordination, and documentation. More than 50% of the time in this consultation was spent on coordinating communication  HISTORY OF PRESENT ILLNESS:  Justin Ruiz is a 69 y.o. year old male with multiple medical problems including morbid obesity, COPD with chronic respiratory failure, diastolic heart failure, obstructive sleep apnea, non-hogkins lypmhoma, Type 2 DM, and history of colon polyp. Palliative Care was asked to help address goals of care.   CODE STATUS: Full Code  PPS: 50%  HOSPICE ELIGIBILITY/DIAGNOSIS: TBD  PAST MEDICAL HISTORY:  Past Medical History:  Diagnosis Date  . Acute diastolic heart failure ([place]) 03/28/2016  . Acute encephalopathy 03/28/2016  . Acute encephalopathy 03/28/2016  . Acute on chronic respiratory failure with hypoxia ([place]) 12/04/2016  . Arthritis   . Asthma   . Atrial fibrillation ([place])    on AC  . Benign neoplasm of ascending colon 05/05/2014  . BPH (benign prostatic hypertrophy)   . CARDIOVASCULAR STUDIES, ABNORMAL 04/27/2009  Qualifier: Diagnosis of  By: Percival Spanish, MD, Farrel Gordon    . Chronic anticoagulation 08/20/2012  . Chronic respiratory failure with hypoxia ([place]) 11/10/2017  . Colon  polyps   . COPD mixed type ([place]) 03/28/2016   PFT 06/17/14- severe obstructive airways disease with slight response to bronchodilator. FVC 2.03/42%, FEV1 1.02/28%, ratio 0.5, TLC 82%, DLCO 59%  . Diabetes mellitus   . Diabetic neuropathy, type II diabetes mellitus ([place]) 09/25/2014  . Diabetic retinopathy ([place]) 05/28/2017  . 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 GERD (gastroesophageal reflux disease)   . HTN (hypertension)    x 3 years  . Hyperlipidemia   . Lung nodules 03/11/2013  . Mediastinal adenopathy 06/20/2013   CT  & PET 2/15   . Memory loss   . 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 NHL (non-Hodgkin's lymphoma) ([place])    nhl dx 9/11  . Obesity    exogenous  . Obstructive sleep apnea 05/25/2014   NPSG-  04/07/2014-severe obstructive sleep apnea-AHI 75.1 per hour. CPAP titrated to 16. He required supplemental oxygen at 2 L/ [employer] which he already wears for sleep at home. Weight 338 pounds   . Peripheral vascular insufficiency ([place]) 08/02/2017  . Personal history of colonic polyps 03/02/2009   Qualifier: Diagnosis of  By: Deatra Ina MD, Sandy Salaam   . Sepsis ([place]) 06/07/2015  . Severe obesity (BMI >= 40) ([place]) 05/01/2013  . Shingles   . Sleep apnea    CPAP machine is broken  . Thrombocytopenia ([place]) 11/23/2016    SOCIAL HX:  Social History   Tobacco Use  . Smoking status: Former Smoker    Packs/day: 1.00    Years: 40.00    Pack years: 40.00    Types: Cigarettes    Start date: 12/17/1968    Quit date: 10/18/2018    Years since quitting: 1.1  . Smokeless tobacco: Never Used  Substance Use Topics  . Alcohol use: No    Alcohol/week: 0.0 standard drinks    Comment: previous    ALLERGIES:  Allergies  Allergen Reactions  . Avapro [Irbesartan] Other (See Comments)    "doesnt sit right with me"--light headed  . Lipitor [Atorvastatin Calcium] Other (See Comments)    Leg pain     PERTINENT MEDICATIONS:  Outpatient  Encounter Medications as of 12/12/2019  Medication Sig  . acetaminophen (TYLENOL) 325 MG tablet Take 650 mg by mouth every 6 (six) hours as needed.  Marland Kitchen albuterol (VENTOLIN HFA) 108 (90 Base) MCG/ACT inhaler Inhale 2 puffs into the lungs every 4 (four) hours as needed for wheezing or shortness of breath.  . ALPRAZolam (XANAX) 0.25 MG tablet Take 0.25 mg by mouth 3 (three) times daily as needed for anxiety or sleep.   . bisoprolol (ZEBETA) 5 MG tablet Take 1 tablet (5 mg total) by mouth 2 (two) times daily.  . budesonide (PULMICORT) 0.25 MG/2ML nebulizer solution Take 2 mLs (0.25 mg total) by nebulization 2 (two) times daily.  . Dulaglutide (TRULICITY) 4.5 CV/8.9FY SOPN Inject 4.5 mg as directed once a week.  Marland Kitchen ELIQUIS 5 MG TABS tablet Take 1 tablet by mouth twice daily (Patient taking differently: Take 5 mg by mouth 2 (two) times daily. )  . escitalopram (LEXAPRO) 20 MG tablet Take 1 tablet (20 mg total) by mouth daily.  . furosemide (LASIX) 40 MG tablet TAKE 1 TABLET BY MOUTH TWICE DAILY AS DIRECTED  . insulin NPH Human (NOVOLIN N) 100 UNIT/ML injection  Use 50 units before Breakfast and 40 before supper  . insulin regular (NOVOLIN R) 100 units/mL injection Inject 10 units before breakfast and again before lunch. Inject 12 units before supper.  Marland Kitchen ipratropium-albuterol (DUONEB) 0.5-2.5 (3) MG/3ML SOLN USE 1 VIAL IN NEBULIZER EVERY 6 HOURS AS NEEDED  . nystatin (MYCOSTATIN/NYSTOP) powder Apply topically 2 (two) times daily. Apply to groin area and scrotum skin; keep area clean and dry.  Marland Kitchen oxymetazoline (AFRIN) 0.05 % nasal spray Place 1 spray into both nostrils 2 (two) times daily as needed for congestion.  . simvastatin (ZOCOR) 40 MG tablet Take 1 tablet by mouth once daily (Patient taking differently: Take 40 mg by mouth at bedtime. )  . Tiotropium Bromide-Olodaterol (STIOLTO RESPIMAT) 2.5-2.5 MCG/ACT AERS Inhale 2 puffs into the lungs daily.   . Vitamin D, Cholecalciferol, 50 MCG (2000 UT) CAPS Take  1 capsule by mouth in the morning.    No facility-administered encounter medications on file as of 12/12/2019.    PHYSICAL EXAM/ROS General: NAD, obese, ill appearing, cooperative Cardiovascular: regular rate and rhythm, denies chest pain, palpitations Pulmonary: diminished lung sounds; no coughing, no respiratory distress Abdomen: soft, nontender, + bowel sounds GU: no suprapubic tenderness Extremities: +1 edema in bil lower extremites, no joint deformities; ambulatory with or without walker within his home Skin: no rashes to exposed skin Neurological: no focal deficit noted  Teodoro Spray, NP

## 2019-12-14 ENCOUNTER — Encounter: Payer: Self-pay | Admitting: Family Medicine

## 2019-12-17 ENCOUNTER — Ambulatory Visit (INDEPENDENT_AMBULATORY_CARE_PROVIDER_SITE_OTHER): Payer: Medicare Other | Admitting: Licensed Clinical Social Worker

## 2019-12-17 DIAGNOSIS — F411 Generalized anxiety disorder: Secondary | ICD-10-CM

## 2019-12-17 DIAGNOSIS — E119 Type 2 diabetes mellitus without complications: Secondary | ICD-10-CM | POA: Diagnosis not present

## 2019-12-17 DIAGNOSIS — Z794 Long term (current) use of insulin: Secondary | ICD-10-CM | POA: Diagnosis not present

## 2019-12-17 DIAGNOSIS — I10 Essential (primary) hypertension: Secondary | ICD-10-CM | POA: Diagnosis not present

## 2019-12-17 DIAGNOSIS — E782 Mixed hyperlipidemia: Secondary | ICD-10-CM | POA: Diagnosis not present

## 2019-12-17 DIAGNOSIS — J449 Chronic obstructive pulmonary disease, unspecified: Secondary | ICD-10-CM

## 2019-12-17 DIAGNOSIS — K219 Gastro-esophageal reflux disease without esophagitis: Secondary | ICD-10-CM

## 2019-12-17 NOTE — Patient Instructions (Addendum)
Licensed Clinical Social Worker Visit Information  Goals we discussed today:   .  Client will talk with LCSW in next 30 days to discuss anxiety, stress issues of client (pt-stated)         Current Barriers:   Anxiety issues in client with Chronic Diagnoses of GAD, COPD,DM Type 2, HLD, HTN, and GERD  Breathing issues  Irregular meal schedule  Clinical Social Work Clinical Goal(s):   Client will talk with LCSW in next 30 days to discus anxiety and stress issues of client  Interventions:    Encouraged Justin Ruiz and spouse to call RNCM to discuss nursing issues of client  Talked with Justin Ruiz about mobility issues of client   Talked with Justin Ruiz about oxygen use of client  Talked with Justin Ruiz about client weakness, tiredness upon exersion  Talked with Justin Ruiz about client's upcoming appointments  Talked with Justin Ruiz about sleeping challenges of client  Talked with Justin Ruiz about relaxation techniques of client (client watches TV, [place] phone with family members)  Talked with Justin Ruiz about mood of client  Talked with Justin Ruiz about client completion of ADLs.  Talked with Justin Ruiz about weight of client  Talked with Justin Ruiz about occasional swelling in legs of client  Talked with Justin Ruiz about client managing diabetes  Talked with Justin Ruiz about pain issues of client  Talked with Justin Ruiz about vision issues of client  Talked with Justin Ruiz about client appointment with Dr. Livia Ruiz at [facility] on 12/18/2019.  Talked with Justin Ruiz about next appointment with [facility] representative at their home on 01/09/2020  Patient Self Care Activities:   Attends scheduled medical appointments  Takes medications as prescribed  Plan:    Client to attend scheduled medical appointments LCSW to call client or spouse of client  in next 4 weeks to talk with client about his anxiety and stress symptoms management.  Client or spouse to talk with RNCM as needed to discuss nursing  needs  Initial goal documentation      Follow Up Plan: LCSW to call client/spouse in next 4 weeks to talk with client/spouse about client's anxiety/stress symptoms management  Materials Provided: No  The patient Justin Ruiz, spouse of client, verbalized understanding of instructions provided today and declined a print copy of patient instruction materials.   Justin Ruiz.Justin Ruiz MSW, LCSW Licensed Clinical Social Worker [facility] Care Management [phone]

## 2019-12-17 NOTE — Chronic Care Management (AMB) (Signed)
Chronic Care Management    Clinical Social Work Follow Up Note  12/17/2019 Name: Justin Ruiz MRN: [id number] DOB: 20-Oct-1950  Justin Ruiz is a 69 y.o. year old male who is a primary care patient of Stacks, [name], MD. The CCM team was consulted for assistance with [employer] .   Review of patient status, including review of consultants reports, other relevant assessments, and collaboration with appropriate care team members and the patient's provider was performed as part of comprehensive patient evaluation and provision of chronic care management services.    SDOH (Social Determinants of Health) assessments performed: No;risk for social isolation; risk for tobacco use; risk for depression; risk for stress; risk for physical inactivity    Chronic Care Management from 03/04/2019 in [place]  PHQ-9 Total Score 6      GAD 7 : Generalized Anxiety Score 03/04/2019  Nervous, Anxious, on Edge 1  Control/stop worrying 0  Worry too much - different things 0  Trouble relaxing 1  Restless 0  Easily annoyed or irritable 0  Afraid - awful might happen 1  Total GAD 7 Score 3  Anxiety Difficulty Somewhat difficult    Outpatient Encounter Medications as of 12/17/2019  Medication Sig  . acetaminophen (TYLENOL) 325 MG tablet Take 650 mg by mouth every 6 (six) hours as needed.  Marland Kitchen albuterol (VENTOLIN HFA) 108 (90 Base) MCG/ACT inhaler Inhale 2 puffs into the lungs every 4 (four) hours as needed for wheezing or shortness of breath.  . ALPRAZolam (XANAX) 0.25 MG tablet Take 0.25 mg by mouth 3 (three) times daily as needed for anxiety or sleep.   . bisoprolol (ZEBETA) 5 MG tablet Take 1 tablet (5 mg total) by mouth 2 (two) times daily.  . budesonide (PULMICORT) 0.25 MG/2ML nebulizer solution Take 2 mLs (0.25 mg total) by nebulization 2 (two) times daily.  . Dulaglutide (TRULICITY) 4.5 XB/2.8UX SOPN Inject 4.5 mg as directed once a week.  Marland Kitchen ELIQUIS 5 MG TABS tablet  Take 1 tablet by mouth twice daily (Patient taking differently: Take 5 mg by mouth 2 (two) times daily. )  . escitalopram (LEXAPRO) 20 MG tablet Take 1 tablet (20 mg total) by mouth daily.  . furosemide (LASIX) 40 MG tablet TAKE 1 TABLET BY MOUTH TWICE DAILY AS DIRECTED  . insulin NPH Human (NOVOLIN N) 100 UNIT/ML injection Use 50 units before Breakfast and 40 before supper  . insulin regular (NOVOLIN R) 100 units/mL injection Inject 10 units before breakfast and again before lunch. Inject 12 units before supper.  Marland Kitchen ipratropium-albuterol (DUONEB) 0.5-2.5 (3) MG/3ML SOLN USE 1 VIAL IN NEBULIZER EVERY 6 HOURS AS NEEDED  . nystatin (MYCOSTATIN/NYSTOP) powder Apply topically 2 (two) times daily. Apply to groin area and scrotum skin; keep area clean and dry.  Marland Kitchen oxymetazoline (AFRIN) 0.05 % nasal spray Place 1 spray into both nostrils 2 (two) times daily as needed for congestion.  . simvastatin (ZOCOR) 40 MG tablet Take 1 tablet by mouth once daily (Patient taking differently: Take 40 mg by mouth at bedtime. )  . Tiotropium Bromide-Olodaterol (STIOLTO RESPIMAT) 2.5-2.5 MCG/ACT AERS Inhale 2 puffs into the lungs daily.   . Vitamin D, Cholecalciferol, 50 MCG (2000 UT) CAPS Take 1 capsule by mouth in the morning.    No facility-administered encounter medications on file as of 12/17/2019.    Goals    .  Client will talk with LCSW in next 30 days to discuss anxiety, stress issues of client (pt-stated)  Current Barriers:  . Anxiety issues in client with Chronic Diagnoses of GAD, COPD,DM Type 2, HLD, HTN, and GERD . Breathing issues . Irregular meal schedule  Clinical Social Work Clinical Goal(s):  . Client will talk with LCSW in next 30 days to discus anxiety and stress issues of client  Interventions:     Encouraged [name] and spouse to call RNCM to discuss nursing issues of client  Talked with [name] about mobility issues of client   Talked with [name] about oxygen use of client  Talked  with [name] about client weakness, tiredness upon exersion  Talked with [name] about client's upcoming appointments  Talked with [name] about sleeping challenges of client  Talked with [name] about relaxation techniques of client (client watches TV, [place] phone with family members)  Talked with [name] about mood of client  Talked with [name] about client completion of ADLs.  Talked with [name] about weight of client  Talked with [name] about occasional swelling in legs of client  Talked with [name] about client managing diabetes  Talked with [name] about pain issues of client  Talked with [name] about vision issues of client  Talked with [name] about client appointment with Dr. L[facility] Snellen at [facility] on 12/18/2019.  Talked with [name] about next appointment with [facility] representative at their home on 01/09/2020  Patient Self Care Activities:  . Attends scheduled medical appointments . Takes medications as prescribed  Plan:    Client to attend scheduled medical appointments LCSW to call client or spouse of client  in next 4 weeks to talk with client about his anxiety and stress symptoms management.  Client or spouse to talk with RNCM as needed to discuss nursing needs  Initial goal documentation      Follow Up Plan: LCSW to call client/spouse in next 4 weeks to talk with client/spouse about client's anxiety/stress symptoms management  Norva Riffle.Jazelle Achey MSW, LCSW Licensed Clinical Social Worker [facility] Care [facility] [phone]

## 2019-12-18 ENCOUNTER — Other Ambulatory Visit: Payer: Self-pay | Admitting: Family Medicine

## 2019-12-18 ENCOUNTER — Other Ambulatory Visit: Payer: Self-pay

## 2019-12-18 ENCOUNTER — Ambulatory Visit (INDEPENDENT_AMBULATORY_CARE_PROVIDER_SITE_OTHER): Payer: Medicare Other | Admitting: Family Medicine

## 2019-12-18 ENCOUNTER — Encounter: Payer: Self-pay | Admitting: Family Medicine

## 2019-12-18 VITALS — BP 94/64 | HR 85 | Temp 97.6°F | Resp 18

## 2019-12-18 DIAGNOSIS — R5383 Other fatigue: Secondary | ICD-10-CM

## 2019-12-18 DIAGNOSIS — I5032 Chronic diastolic (congestive) heart failure: Secondary | ICD-10-CM

## 2019-12-18 DIAGNOSIS — R0602 Shortness of breath: Secondary | ICD-10-CM | POA: Diagnosis not present

## 2019-12-18 DIAGNOSIS — J449 Chronic obstructive pulmonary disease, unspecified: Secondary | ICD-10-CM

## 2019-12-18 DIAGNOSIS — F321 Major depressive disorder, single episode, moderate: Secondary | ICD-10-CM

## 2019-12-18 DIAGNOSIS — E119 Type 2 diabetes mellitus without complications: Secondary | ICD-10-CM | POA: Diagnosis not present

## 2019-12-18 DIAGNOSIS — Z794 Long term (current) use of insulin: Secondary | ICD-10-CM

## 2019-12-18 LAB — GLUCOSE HEMOCUE WAIVED: Glu Hemocue Waived: 164 mg/dL — ABNORMAL HIGH (ref 65–99)

## 2019-12-18 LAB — HEMOGLOBIN, FINGERSTICK: Hemoglobin: 13.2 g/dL (ref 12.6–17.7)

## 2019-12-18 MED ORDER — FUROSEMIDE 40 MG PO TABS: 40.0000 mg | ORAL_TABLET | Freq: Two times a day (BID) | ORAL | 0 refills | Status: AC

## 2019-12-18 MED ORDER — TRULICITY 4.5 MG/0.5ML ~~LOC~~ SOAJ: 4.5000 mg | SUBCUTANEOUS | 5 refills | Status: AC

## 2019-12-18 MED ORDER — STIOLTO RESPIMAT 2.5-2.5 MCG/ACT IN AERS: 2.0000 | INHALATION_SPRAY | Freq: Every day | RESPIRATORY_TRACT | 3 refills | Status: AC

## 2019-12-18 MED ORDER — ALBUTEROL SULFATE HFA 108 (90 BASE) MCG/ACT IN AERS: 2.0000 | INHALATION_SPRAY | RESPIRATORY_TRACT | Status: AC | PRN

## 2019-12-18 MED ORDER — APIXABAN 5 MG PO TABS: 5.0000 mg | ORAL_TABLET | Freq: Two times a day (BID) | ORAL | 1 refills | Status: AC

## 2019-12-18 MED ORDER — BUDESONIDE 0.25 MG/2ML IN SUSP: 0.2500 mg | Freq: Two times a day (BID) | RESPIRATORY_TRACT | 12 refills | Status: AC

## 2019-12-18 MED ORDER — ESCITALOPRAM OXALATE 20 MG PO TABS: 20.0000 mg | ORAL_TABLET | Freq: Every day | ORAL | 1 refills | Status: AC

## 2019-12-18 MED ORDER — SIMVASTATIN 40 MG PO TABS: 40.0000 mg | ORAL_TABLET | Freq: Every day | ORAL | 1 refills | Status: AC

## 2019-12-18 NOTE — Progress Notes (Signed)
Subjective:  Patient ID: Justin Ruiz, male    DOB: 09/13/1950  Age: 69 y.o. MRN: [id number]  CC: Follow-up (2 week) and Shortness of Breath   HPI Justin Ruiz presents forFollow-up of diabetes. MEds adjusted 2 weeks ago.  Patient checks blood sugar at home.  Log reviewed. Fasting , mid 200s. Lunch & supper, slightly higher. Patient denies symptoms such as polyuria, polydipsia, excessive hunger, nausea No significant hypoglycemic spells noted. Medications reviewed. Pt reports taking them regularly without complication/adverse reaction being reported today.  Breathing a little better, was able to walk from car to bilding entrance today. He says that is an improvement.   History Justin Ruiz has a past medical history of Acute diastolic heart failure ([place]) (03/28/2016), Acute encephalopathy (03/28/2016), Acute encephalopathy (03/28/2016), Acute on chronic respiratory failure with hypoxia ([place]) (12/04/2016), Arthritis, Asthma, Atrial fibrillation ([place]), Benign neoplasm of ascending colon (05/05/2014), BPH (benign prostatic hypertrophy), CARDIOVASCULAR STUDIES, ABNORMAL (04/27/2009), Chronic anticoagulation (08/20/2012), Chronic respiratory failure with hypoxia ([place]) (11/10/2017), Colon polyps, COPD mixed type ([place]) (03/28/2016), Diabetes mellitus, Diabetic neuropathy, type II diabetes mellitus ([place]) (09/25/2014), Diabetic retinopathy ([place]) (05/28/2017), 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 ([facility]) (10/08/2015), GERD (gastroesophageal reflux disease), HTN (hypertension), Hyperlipidemia, Lung nodules (03/11/2013), Mediastinal adenopathy (06/20/2013), Memory loss, MGUS (monoclonal gammopathy of unknown significance) (10/08/2015), NHL (non-Hodgkin's lymphoma) ([place]), Obesity, Obstructive sleep apnea (05/25/2014), Peripheral vascular insufficiency ([place]) (08/02/2017), Personal history of colonic polyps (03/02/2009), Sepsis ([place]) (06/07/2015), Severe obesity (BMI >= 40) ([place]) (05/01/2013), Shingles, Sleep apnea,  and Thrombocytopenia ([place]) (11/23/2016).   He has a past surgical history that includes Appendectomy and Colonoscopy with propofol (N/A, 05/05/2014).   His family history includes Aortic aneurysm in his sister; COPD in his sister and sister; Colon cancer in his father; Colon polyps in his father; Diabetes in his maternal grandmother, mother, and sister; Heart attack in his sister; Heart disease in his mother, sister, and sister; Liver cancer in his mother; Prostate cancer in his father; Pulmonary embolism in his sister.He reports that he quit smoking about 14 months ago. His smoking use included cigarettes. He started smoking about 51 years ago. He has a 40.00 pack-year smoking history. He has never used smokeless tobacco. He reports that he does not drink alcohol and does not use drugs.  Current Outpatient Medications on File Prior to Visit  Medication Sig Dispense Refill  . acetaminophen (TYLENOL) 325 MG tablet Take 650 mg by mouth every 6 (six) hours as needed.    . ALPRAZolam (XANAX) 0.25 MG tablet Take 0.25 mg by mouth 3 (three) times daily as needed for anxiety or sleep.     . insulin NPH Human (NOVOLIN N) 100 UNIT/ML injection Use 50 units before Breakfast and 40 before supper 30 mL 11  . insulin regular (NOVOLIN R) 100 units/mL injection Inject 10 units before breakfast and again before lunch. Inject 12 units before supper. 10 mL 5  . ipratropium-albuterol (DUONEB) 0.5-2.5 (3) MG/3ML SOLN USE 1 VIAL IN NEBULIZER EVERY 6 HOURS AS NEEDED 1080 mL 1  . nystatin (MYCOSTATIN/NYSTOP) powder Apply topically 2 (two) times daily. Apply to groin area and scrotum skin; keep area clean and dry. 15 g 0  . oxymetazoline (AFRIN) 0.05 % nasal spray Place 1 spray into both nostrils 2 (two) times daily as needed for congestion.    . Vitamin D, Cholecalciferol, 50 MCG (2000 UT) CAPS Take 1 capsule by mouth in the morning.      No current facility-administered medications on file prior to visit.  ROS Review  of Systems  Constitutional: Positive for appetite change (decreased) and fatigue. Negative for fever and unexpected weight change.  HENT: Negative.   Eyes: Negative for visual disturbance.  Respiratory: Positive for cough and shortness of breath.   Cardiovascular: Positive for leg swelling. Negative for chest pain.  Gastrointestinal: Negative for abdominal pain, diarrhea, nausea and vomiting.  Genitourinary: Negative for difficulty urinating.  Musculoskeletal: Positive for arthralgias and myalgias.  Skin: Negative for rash.  Neurological: Negative for headaches.  Psychiatric/Behavioral: Positive for confusion and decreased concentration. Negative for sleep disturbance. The patient is nervous/anxious.     Objective:  BP 94/64   Pulse 85   Temp 97.6 F (36.4 C) (Temporal)   Resp 18   SpO2 99%   BP Readings from Last 3 Encounters:  12/18/19 94/64  12/03/19 134/70  11/29/19 130/66    Wt Readings from Last 3 Encounters:  12/03/19 279 lb (126.6 kg)  11/29/19 289 lb 3.2 oz (131.2 kg)  10/21/19 289 lb 6.4 oz (131.3 kg)     Physical Exam    Assessment & Plan:   Reubin was seen today for follow-up and shortness of breath.  Diagnoses and all orders for this visit:  Diabetes mellitus type 2, insulin dependent (HCC) -     Glucose Hemocue Waived  Fatigue, unspecified type -     Hemoglobin, fingerstick  Shortness of breath -     Hemoglobin, fingerstick  COPD mixed type (HCC) -     budesonide (PULMICORT) 0.25 MG/2ML nebulizer solution; Take 2 mLs (0.25 mg total) by nebulization 2 (two) times daily.  Depression, major, single episode, moderate (HCC) -     escitalopram (LEXAPRO) 20 MG tablet; Take 1 tablet (20 mg total) by mouth daily.  Chronic diastolic heart failure (HCC) -     furosemide (LASIX) 40 MG tablet; Take 1 tablet (40 mg total) by mouth 2 (two) times daily. as directed  Other orders -     albuterol (VENTOLIN HFA) 108 (90 Base) MCG/ACT inhaler; Inhale 2 puffs  into the lungs every 4 (four) hours as needed for wheezing or shortness of breath. -     Dulaglutide (TRULICITY) 4.5 BJ/6.2GB SOPN; Inject 0.5 mLs (4.5 mg total) as directed once a week. -     apixaban (ELIQUIS) 5 MG TABS tablet; Take 1 tablet (5 mg total) by mouth 2 (two) times daily. -     simvastatin (ZOCOR) 40 MG tablet; Take 1 tablet (40 mg total) by mouth daily. -     Tiotropium Bromide-Olodaterol (STIOLTO RESPIMAT) 2.5-2.5 MCG/ACT AERS; Inhale 2 puffs into the lungs daily.      I have discontinued [name] B. Tift's bisoprolol. I have changed his Eliquis to apixaban. I have also changed his Trulicity, furosemide, and simvastatin. Additionally, I am having him maintain his acetaminophen, nystatin, oxymetazoline, Vitamin D (Cholecalciferol), ALPRAZolam, insulin regular, ipratropium-albuterol, insulin NPH Human, albuterol, budesonide, escitalopram, and Stiolto Respimat.  Meds ordered this encounter  Medications  . albuterol (VENTOLIN HFA) 108 (90 Base) MCG/ACT inhaler    Sig: Inhale 2 puffs into the lungs every 4 (four) hours as needed for wheezing or shortness of breath.  . budesonide (PULMICORT) 0.25 MG/2ML nebulizer solution    Sig: Take 2 mLs (0.25 mg total) by nebulization 2 (two) times daily.    Dispense:  120 mL    Refill:  12  . Dulaglutide (TRULICITY) 4.5 TD/1.7OH SOPN    Sig: Inject 0.5 mLs (4.5 mg total) as directed once a week.  Dispense:  2 mL    Refill:  5  . apixaban (ELIQUIS) 5 MG TABS tablet    Sig: Take 1 tablet (5 mg total) by mouth 2 (two) times daily.    Dispense:  180 tablet    Refill:  1  . escitalopram (LEXAPRO) 20 MG tablet    Sig: Take 1 tablet (20 mg total) by mouth daily.    Dispense:  90 tablet    Refill:  1  . furosemide (LASIX) 40 MG tablet    Sig: Take 1 tablet (40 mg total) by mouth 2 (two) times daily. as directed    Dispense:  180 tablet    Refill:  0  . simvastatin (ZOCOR) 40 MG tablet    Sig: Take 1 tablet (40 mg total) by mouth daily.     Dispense:  90 tablet    Refill:  1  . Tiotropium Bromide-Olodaterol (STIOLTO RESPIMAT) 2.5-2.5 MCG/ACT AERS    Sig: Inhale 2 puffs into the lungs daily.    Dispense:  12 g    Refill:  3     Follow-up: Return in about 1 month (around 01/18/2020), or if symptoms worsen or fail to improve.  [name], M.D.

## 2019-12-20 ENCOUNTER — Other Ambulatory Visit: Payer: Self-pay | Admitting: Family Medicine

## 2019-12-20 DIAGNOSIS — F321 Major depressive disorder, single episode, moderate: Secondary | ICD-10-CM

## 2019-12-27 ENCOUNTER — Telehealth: Payer: Self-pay | Admitting: Nurse Practitioner

## 2020-01-01 ENCOUNTER — Other Ambulatory Visit: Payer: Self-pay

## 2020-01-01 ENCOUNTER — Ambulatory Visit (INDEPENDENT_AMBULATORY_CARE_PROVIDER_SITE_OTHER): Payer: Medicare Other

## 2020-01-01 ENCOUNTER — Encounter: Payer: Self-pay | Admitting: Internal Medicine

## 2020-01-01 ENCOUNTER — Ambulatory Visit: Payer: Medicare Other | Admitting: Internal Medicine

## 2020-01-01 ENCOUNTER — Telehealth: Payer: Self-pay | Admitting: Internal Medicine

## 2020-01-01 VITALS — BP 140/64 | HR 62 | Temp 97.9°F | Ht 70.0 in | Wt 286.8 lb

## 2020-01-01 DIAGNOSIS — J9611 Chronic respiratory failure with hypoxia: Secondary | ICD-10-CM | POA: Diagnosis not present

## 2020-01-01 DIAGNOSIS — J449 Chronic obstructive pulmonary disease, unspecified: Secondary | ICD-10-CM

## 2020-01-01 DIAGNOSIS — Z515 Encounter for palliative care: Secondary | ICD-10-CM | POA: Insufficient documentation

## 2020-01-01 MED ORDER — IPRATROPIUM-ALBUTEROL 0.5-2.5 (3) MG/3ML IN SOLN: RESPIRATORY_TRACT | 1 refills | Status: AC

## 2020-01-01 MED ORDER — OXYCODONE HCL 5 MG PO TABS: 5.0000 mg | ORAL_TABLET | Freq: Four times a day (QID) | ORAL | 0 refills | Status: AC | PRN

## 2020-01-01 NOTE — Assessment & Plan Note (Signed)
End-stage lung disease. Needs to continue working with palliative care. Hospice will be appropriate when he is les anxious and can better cope with no CPR advice. Care-giver fatigue is important. Plan- CXR, refill neb solution with discussion, try oxycodone to blunt dyspnea and anxiety. Discussed narcotic use.

## 2020-01-01 NOTE — Telephone Encounter (Signed)
I gabve him oxycodone to help with dyspnea and hopefully reduce his need for nebulizer treatments to every 6 hours.

## 2020-01-01 NOTE — Assessment & Plan Note (Signed)
Remains dependent on O2 3-4L/ min

## 2020-01-01 NOTE — Progress Notes (Signed)
HPI male former smoker followed for COPD/chronic hypoxic respiratory failure, OSA, lung nodules, allergic rhinitis, complicated by history lymphoma, DM, A. fib/Coumadin, morbid obesity NPSG 04/07/14-  AHI 75.1/ hr, desaturation to 79% ( O2 was added at 2L), CPAP to  16, Body weight 338 lbs PFT 06/17/14- severe obstructive airways disease with slight response to bronchodilator. FVC 2.03/42%, FEV1 1.02/28%, ratio 0.5, TLC 82%, DLCO 59% PFT 11/10/2017-very severe obstructive airways disease with response to bronchodilator, air trapping, diffusion severely reduced.  FVC 1.65/35%, FEV1 0.79/22%, ratio 0.48, TLC 104%, DLCO 29% -----------------------------------------------------------------     05/06/19- Virtual Visit via Telephone Note  History of Present Illness: 69 year-old male  smoker followed for COPD/chronic hypoxic respiratory failure, OSA, allergic rhinitis, lung nodules, complicated by history lymphoma, DM2, A. fib/Eliquis, morbid obesity CPAP auto 12-20/[employer]  O2 3-4 L continuous Download  Neb Duoneb, albuterol hfa, Trelegy,  Says hasn't been able to use CPAP because he needs a mask. Continues O2. Says breathing has been "fine" with no exacerbation.  Observations/Objective: CTa chest 11/08/2018-  IMPRESSION: 1. Technically adequate exam showing no acute pulmonary embolus. 2. Trace LEFT pleural effusion. 3. Coronary artery disease. 4. Cholelithiasis. 5. Question of cirrhosis. 6. 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 7.  Aortic atherosclerosis.  (ICD10-I70.0) I  Assessment and Plan: OSA- educated on communication with [place] and DME. Replace mask. Chronic resp fail w hypoxia- continue O2 3-4L  Follow Up Instructions: 6 months   01/01/20- 69 year-old male  smoker followed for [employer] D COPD/chronic hypoxic respiratory failure, OSA, allergic rhinitis, lung nodules, complicated by history lymphoma, DM2, A. fib/Eliquis, morbid obesity Stiolto, Neb Duoneb/  Budesonide CPAP auto 12-20/[employer]  O2 3-4 L continuous    Quit CPAP "smothering" Download- Body weight today- Covid Vax- 2 Moderna ---Increased sob,no cough/wheeze Had acute visit w Dr Erin Fulling 8/6- Palliative Care consulted and made home visit. Patient stated he wants full-code.  Last hosp in June for hyperglycemia Wife is quite frank about caregiver fatigue "he is driving me nuts". Says he doesn't sleep, is always anxious, overusing nebulizer, always complaining he can't breathe, even when sats in 90% range.  He only sleeps in chair.  ROS-see HPI      + = positive Constitutional:   No-   weight loss, night sweats, fevers, chills, +fatigue, lassitude. HEENT:   No-  headaches, difficulty swallowing, tooth/dental problems, sore throat,       No-  sneezing, itching, ear ache, nasal congestion, post nasal drip,  CV:  No-   chest pain, orthopnea, PND, swelling in lower extremities, anasarca,                                                           dizziness, palpitations Resp: + shortness of breath with exertion or at rest.              No-   productive cough,  No non-productive cough,  No- coughing up of blood.              No-   change in color of mucus.   Skin: No-   rash or lesions. GI:  No-   heartburn, indigestion, abdominal pain, nausea, vomiting,  GU:  MS:  No-   joint pain or swelling.  . Neuro-  nothing unusual Psych:  No- change in mood or affect. No depression or anxiety.  No memory loss.  OBJ- Physical Exam     O2 3 L, saturation 96% at rest General- Alert, Oriented, Affect-appropriate, Distress- none acute, +obese Skin- rash-none, lesions- none, excoriation- none Lymphadenopathy- none Head- atraumatic            Eyes- Gross vision intact, PERRLA, conjunctivae and secretions clear,             Ears- Hearing, canals-normal            Nose- Clear, no-Septal dev, mucus, polyps, erosion, perforation             Throat- Mallampati II-III , mucosa clear , drainage- none,  tonsils- atrophic Neck- flexible , trachea midline, no stridor , thyroid nl, carotid no bruit Chest - symmetrical excursion , unlabored           Heart/CV- IRR +, no murmur , no gallop  , no rub, nl s1 s2                           - JVD- none , edema- none, stasis changes+, varices- none           Lung- decreased airflow/clear, unlabored, wheeze- none, , dullness-none, rub- none +He was pursed- lip breathing, rapid, shallow. I could coach him into slower, deeper breaths.            Chest wall-  Abd-  Br/ Gen/ Rectal- Not done, not indicated Extrem- cyanosis- none, clubbing, none, atrophy- none, strength- nl Neuro- grossly intact to observation

## 2020-01-01 NOTE — Telephone Encounter (Signed)
Called and spoke with Dorris.  Patient's wife came into pharmacy earlier today stating Patient was almost out of duo neb and needed to pick up refill.  Patient's wife expressed to Pharmacist that Patient was using 10 vials/day of his duo neb. Patient's wife picked up last refill 12/04/19, for 12 boxes, and that contained 360 vials. I did explained per Patient's chart he was palliative care. Pharmacist is concerned about insurance and amount Patient is using, because Duo neb prescription is 1 vial every 6 hours as needed.  Message routed to Dr Annamaria Boots

## 2020-01-01 NOTE — Patient Instructions (Signed)
Order- CXR   Dx COPD mixed type  Script sent for oxycodone- use sparingly when needed to blunt shortness of breath  Try using a fan so you feel air moving if comfortable  Ok to use oxygen at 4l  Refill sent for Duoneb neb solution

## 2020-01-02 ENCOUNTER — Encounter: Payer: Self-pay | Admitting: *Deleted

## 2020-01-02 NOTE — Telephone Encounter (Signed)
lmtcb for [employer].

## 2020-01-04 NOTE — Telephone Encounter (Signed)
Called and spoke with [name] letting her know the info stated by CY and she verbalized understanding. Nothing further needed.

## 2020-01-05 DIAGNOSIS — J9611 Chronic respiratory failure with hypoxia: Secondary | ICD-10-CM | POA: Diagnosis not present

## 2020-01-05 DIAGNOSIS — I509 Heart failure, unspecified: Secondary | ICD-10-CM | POA: Diagnosis not present

## 2020-01-05 DIAGNOSIS — M6281 Muscle weakness (generalized): Secondary | ICD-10-CM | POA: Diagnosis not present

## 2020-01-05 DIAGNOSIS — I872 Venous insufficiency (chronic) (peripheral): Secondary | ICD-10-CM | POA: Diagnosis not present

## 2020-01-07 ENCOUNTER — Telehealth: Payer: Self-pay | Admitting: Family Medicine

## 2020-01-07 ENCOUNTER — Other Ambulatory Visit: Payer: Self-pay

## 2020-01-07 ENCOUNTER — Other Ambulatory Visit: Payer: Medicare Other | Admitting: Nurse Practitioner

## 2020-01-07 DIAGNOSIS — Z515 Encounter for palliative care: Secondary | ICD-10-CM

## 2020-01-07 DIAGNOSIS — J9611 Chronic respiratory failure with hypoxia: Secondary | ICD-10-CM | POA: Diagnosis not present

## 2020-01-07 DIAGNOSIS — J9610 Chronic respiratory failure, unspecified whether with hypoxia or hypercapnia: Secondary | ICD-10-CM

## 2020-01-07 NOTE — Progress Notes (Signed)
[address] Consult Note Telephone: [phone]  Fax: [phone]  PATIENT NAME: DIVANTE KOTCH [place] [address] [phone] (home)  DOB: [birth year] MRN: [id number]  PRIMARY CARE PROVIDER:    [name], MD,  [place] [address] [phone]  REFERRING PROVIDER:   [name], MD [place],  [place] [address] [phone]  RESPONSIBLE PARTY:   Extended Emergency Contact Information Primary Emergency Contact: Zwilling,Pauline Address: [address]          [place], [place] [address] [place] Phone: [phone] Mobile Phone: [phone] Relation: Spouse  I met face to face with patient and wife in home.  ASSESSMENT AND RECOMMENDATIONS:   1. Advance Care Planning/Goals of Care: Patient's goal of care is comfort. Patient verbalized desire to be comfortable, wants to avoid hospitalization as much as possible.    Code status: After discussions on patient's current chronic health conditions, also reviewed disease trajectory of COPD. Patient and wife expressed understanding that his COPD diease is at it's end stages, corroborating his pulmonologist report. Patient expressed desire to not be on life support. Patient decided to changed his code status from Full Code to Do Not Resuscitate. Expressed desire to live the rest of his life comfortably, validation provided. DNR form completed and given to patient's wife to place in a readily accessible place in their home, copy uploaded to [facility] EMR.  Directives: Discussed and reviewed the importance of having an advanced directive. Assisted patient in the presence of his wife to complete a MOST form, details include: limited additional intervention, antibiotics if indicated, IV fluids for defined trial period, feeding tube for defined trial period. Form signed by patient, copy uploaded to [facility]  EMR.  2. Symptom Management: Patient with Gold Group D COPD/chronic hypoxic respiratory failure. Patient continues to have compliant of inability to breath. He was last seen by his pulmonologist on 01/01/2020, was prescribed Oxycodone 43m every 6 hrs as needed for SOB due to patient's excessive use of neb treatment. Wife said " Doctor said no more treatments, just make him comfortable". Patient report no relief with med. Wife report patient still complaining of SOB, takes prescribed oxycodone round the clock except at night. Wife report giving oxycodone and 2 tabs of Xanax 0.277mlast night before patient could go to sleep. Of note patient unable to lay down in bed, sleeps in his recliner. During visit today, pt unable to speak in sentences, uses gestures to answer questions, was alternating between tripod position and sitting back in his recliner. Patient appeared uncomfortable, wife report this as normal presentation for patient. Patient on 4L oxygen saturation 95%, HR 84. Wife report anxiety, and often cries from frustration from patient's inability to be comfortable and constant complaint of inability to breath, saying "I'm exhausted physically, mentally and emotionally". Discussed Hospice care and it's benefit given patient's worsening condition and possibility of patient's wife receiving respite assistance and counseling. Discussed patient using fan as recommended by pulmonologist and purse lip breathing. Patient and wife expressed interest in Hospice care services. Patient taking on over the counter stool softer to prevent constipation related to opioid use.   3. Follow up Palliative Care Visit: Palliative care will continue to provide support to patient, family and the medical team. Return 4 weeks or prn.  4. Family /Caregiver/Community Supports: Patient lives in home with wife who is his main care giver. Patient  has a friend Langley Gauss who lives in a camper in his compound helps with mowning his yard and  fixing things in his house. He receives food from [employer] program.  5. Cognitive / Functional decline: Patient appeared drowsy, but coherent and able to make his own decisions. He is unable to ambulate outside his home due to activity intolerance. He is incontinent of urine, able to ambulate within his home without assistance, but has to stop to catch his breath. He mostly sits in his recliner.  I spent 60 minutes providing this consultation, time includes time spent with patient/family, chart review, provider coordination, and documentation. More than 50% of the time in this consultation was spent coordinating communication.   HISTORY OF PRESENT ILLNESS:  GRAIDEN HENES is a 69 y.o. year old male with multiple medical problems including Gold Group D COPD/chronic hypoxic respiratory failure, OSA, allergic rhinitis, lung nodules, complicated by history lymphoma, DM2 (A1c 10.5 on 12/03/2019), A. fib/Eliquis, morbid obesity. Palliative Care was asked to follow this patient by consultation request of [name], MD to help address advance care planning and goals of care. Palliative Care was asked to help address goals of care. This is a follow up visit, last visit on 12/12/2019  CODE STATUS: DNR  PPS: 30%  HOSPICE ELIGIBILITY/DIAGNOSIS: Reviewed patient's case with Hospice physician, patient deemed eligible for services. Pending referral from his PCP.  PAST MEDICAL HISTORY:  Past Medical History:  Diagnosis Date   Acute diastolic heart failure ([place]) 03/28/2016   Acute encephalopathy 03/28/2016   Acute encephalopathy 03/28/2016   Acute on chronic respiratory failure with hypoxia (HCC) 12/04/2016   Arthritis    Asthma    Atrial fibrillation (HCC)    on AC   Benign neoplasm of ascending colon 05/05/2014   BPH (benign prostatic hypertrophy)    CARDIOVASCULAR STUDIES, ABNORMAL 04/27/2009   Qualifier: Diagnosis of  By: Percival Spanish, MD, Farrel Gordon     Chronic anticoagulation 08/20/2012    Chronic respiratory failure with hypoxia ([place]) 11/10/2017   Colon polyps    COPD mixed type ([place]) 03/28/2016   PFT 06/17/14- severe obstructive airways disease with slight response to bronchodilator. 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 CPAP titrated to 16. He required supplemental oxygen at 2 L/ [employer] which he already wears for sleep at home. Weight 338 pounds    Peripheral vascular insufficiency ([place]) 08/02/2017   Personal history of colonic polyps 03/02/2009   Qualifier: Diagnosis of  By: Deatra Ina MD, Sandy Salaam    Sepsis ([place]) 06/07/2015   Severe obesity (BMI >= 40) ([place]) 05/01/2013   Shingles    Sleep apnea    CPAP machine is broken   Thrombocytopenia ([place]) 11/23/2016    SOCIAL HX:  Social History   Tobacco  Use   Smoking status: Former Smoker    Packs/day: 1.00    Years: 40.00    Pack years: 40.00    Types: Cigarettes    Start date: 12/17/1968    Quit date: 10/18/2018    Years since quitting: 1.2   Smokeless tobacco: Never Used  Substance Use Topics   Alcohol use: No    Alcohol/week: 0.0 standard drinks    Comment: previous   FAMILY HX:  Family History  Problem Relation Age of Onset   Liver cancer Mother    Diabetes Mother    Heart disease Mother    Colon cancer Father    Prostate cancer  Father    Colon polyps Father    Pulmonary embolism Sister    Diabetes Sister    Heart attack Sister    Aortic aneurysm Sister    COPD Sister    Heart disease Sister    COPD Sister    Heart disease Sister    Diabetes Maternal Grandmother     ALLERGIES:  Allergies  Allergen Reactions   Avapro [Irbesartan] Other (See Comments)    "doesnt sit right with me"--light headed   Lipitor [Atorvastatin Calcium] Other (See Comments)    Leg pain     PERTINENT MEDICATIONS:  Outpatient Encounter Medications as of 01/07/2020  Medication Sig   acetaminophen (TYLENOL) 325 MG tablet Take 650 mg by mouth every 6 (six) hours as needed.   albuterol (VENTOLIN HFA) 108 (90 Base) MCG/ACT inhaler Inhale 2 puffs into the lungs every 4 (four) hours as needed for wheezing or shortness of breath.   ALPRAZolam (XANAX) 0.25 MG tablet Take 0.25 mg by mouth 3 (three) times daily as needed for anxiety or sleep.    apixaban (ELIQUIS) 5 MG TABS tablet Take 1 tablet (5 mg total) by mouth 2 (two) times daily.   budesonide (PULMICORT) 0.25 MG/2ML nebulizer solution Take 2 mLs (0.25 mg total) by nebulization 2 (two) times daily.   Dulaglutide (TRULICITY) 4.5 DJ/4.9FW SOPN Inject 0.5 mLs (4.5 mg total) as directed once a week.   escitalopram (LEXAPRO) 20 MG tablet Take 1 tablet (20 mg total) by mouth daily.   furosemide (LASIX) 40 MG tablet Take 1 tablet (40 mg total) by mouth 2 (two) times daily. as directed   insulin NPH Human (NOVOLIN N) 100 UNIT/ML injection Use 50 units before Breakfast and 40 before supper   insulin regular (NOVOLIN R) 100 units/mL injection Inject 10 units before breakfast and again before lunch. Inject 12 units before supper.   ipratropium-albuterol (DUONEB) 0.5-2.5 (3) MG/3ML SOLN USE 1 VIAL IN NEBULIZER EVERY 6 HOURS AS NEEDED   nystatin (MYCOSTATIN/NYSTOP) powder Apply topically 2 (two) times daily. Apply to groin area and scrotum skin; keep area clean and dry.    oxyCODONE (ROXICODONE) 5 MG immediate release tablet Take 1 tablet (5 mg total) by mouth every 6 (six) hours as needed for severe pain. Or shortness of breath   oxymetazoline (AFRIN) 0.05 % nasal spray Place 1 spray into both nostrils 2 (two) times daily as needed for congestion.   simvastatin (ZOCOR) 40 MG tablet Take 1 tablet (40 mg total) by mouth daily.   Tiotropium Bromide-Olodaterol (STIOLTO RESPIMAT) 2.5-2.5 MCG/ACT AERS Inhale 2 puffs into the lungs daily.   Vitamin D, Cholecalciferol, 50 MCG (2000 UT) CAPS Take 1 capsule by mouth in the morning.    No facility-administered encounter medications on file as of 01/07/2020.    PHYSICAL EXAM / ROS:   Current  and past weights: stable, 288.6lbs, BMI 42kg/m2 General: illl appearing, obese, appeared uncomfortable Cardiovascular: denied chest pain, denied palpitation, S1S2 normal, 2+ BLE edema  Pulmonary: no cough, no wheeze, diminished lung sounds, oxygen supplementation via nasal canula Abdomen: appetite fair, denied constipation, continent of bowel GU: denies dysuria, incontinent of urine MSK:  no joint and ROM abnormalities Skin: scratch markes on bilateral lower legs, no wounds reported Neurological: Weakness, but otherwise nonfocal   Jari Favre, DNP, AGPCNP-BC

## 2020-01-08 ENCOUNTER — Telehealth: Payer: Self-pay | Admitting: Family Medicine

## 2020-01-08 NOTE — Telephone Encounter (Signed)
Pts wife wants to know if Dr Livia Snellen received order from [facility] for pt. Says pt had a visit with someone from [facility] yesterday and lady told pts wife to call us to see if Dr Livia Snellen has completed order for pt and sent order.

## 2020-01-08 NOTE — Telephone Encounter (Signed)
Wife notified that paperwork is on Dr Livia Snellen desk and as soon as returned will be faxed. Wife verbalized understanding

## 2020-01-08 NOTE — Telephone Encounter (Signed)
I okayed that verbally yesterday. Im not sure what paperwork they will need.

## 2020-01-08 NOTE — Telephone Encounter (Signed)
No order has been received, I pulled referral from my forms and placed on Dr. Livia Snellen desk

## 2020-01-09 NOTE — Telephone Encounter (Signed)
Orders and chart information faxed to [facility], confirmation received

## 2020-01-21 ENCOUNTER — Ambulatory Visit (INDEPENDENT_AMBULATORY_CARE_PROVIDER_SITE_OTHER): Payer: Medicare Other | Admitting: Licensed Clinical Social Worker

## 2020-01-21 DIAGNOSIS — I10 Essential (primary) hypertension: Secondary | ICD-10-CM | POA: Diagnosis not present

## 2020-01-21 DIAGNOSIS — E1165 Type 2 diabetes mellitus with hyperglycemia: Secondary | ICD-10-CM

## 2020-01-21 DIAGNOSIS — K219 Gastro-esophageal reflux disease without esophagitis: Secondary | ICD-10-CM

## 2020-01-21 DIAGNOSIS — Z794 Long term (current) use of insulin: Secondary | ICD-10-CM | POA: Diagnosis not present

## 2020-01-21 DIAGNOSIS — J449 Chronic obstructive pulmonary disease, unspecified: Secondary | ICD-10-CM

## 2020-01-21 DIAGNOSIS — F411 Generalized anxiety disorder: Secondary | ICD-10-CM

## 2020-01-21 DIAGNOSIS — E782 Mixed hyperlipidemia: Secondary | ICD-10-CM

## 2020-01-21 NOTE — Chronic Care Management (AMB) (Signed)
Chronic Care Management    Clinical Social Work Follow Up Note  01/21/2020 Name: Justin Ruiz MRN: [id number] DOB: 1950/06/05  Justin Ruiz is a 69 y.o. year old male who is a primary care patient of Stacks, Justin Gash, MD. The CCM team was consulted for assistance with [employer] .   Review of patient status, including review of consultants reports, other relevant assessments, and collaboration with appropriate care team members and the patient's provider was performed as part of comprehensive patient evaluation and provision of chronic care management services.    SDOH (Social Determinants of Health) assessments performed: No;risk for depression;risk for stress; risk for social isolation; risk for tobacco use; risk for physical inactivity     Office Visit from 12/18/2019 in [place]  PHQ-9 Total Score 6     GAD 7 : Generalized Anxiety Score 03/04/2019  Nervous, Anxious, on Edge 1  Control/stop worrying 0  Worry too much - different things 0  Trouble relaxing 1  Restless 0  Easily annoyed or irritable 0  Afraid - awful might happen 1  Total GAD 7 Score 3  Anxiety Difficulty Somewhat difficult    Outpatient Encounter Medications as of 01/21/2020  Medication Sig  . acetaminophen (TYLENOL) 325 MG tablet Take 650 mg by mouth every 6 (six) hours as needed.  Marland Kitchen albuterol (VENTOLIN HFA) 108 (90 Base) MCG/ACT inhaler Inhale 2 puffs into the lungs every 4 (four) hours as needed for wheezing or shortness of breath.  . ALPRAZolam (XANAX) 0.25 MG tablet Take 0.25 mg by mouth 3 (three) times daily as needed for anxiety or sleep.   Marland Kitchen apixaban (ELIQUIS) 5 MG TABS tablet Take 1 tablet (5 mg total) by mouth 2 (two) times daily.  . budesonide (PULMICORT) 0.25 MG/2ML nebulizer solution Take 2 mLs (0.25 mg total) by nebulization 2 (two) times daily.  . Dulaglutide (TRULICITY) 4.69 YO/3.7CH SOPN Inject 0.5 mLs (4.5 mg total) as directed once a week.  . escitalopram  (LEXAPRO) 20 MG tablet Take 1 tablet (20 mg total) by mouth daily.  . furosemide (LASIX) 40 MG tablet Take 1 tablet (40 mg total) by mouth 2 (two) times daily. as directed  . insulin NPH Human (NOVOLIN N) 100 UNIT/ML injection Use 50 units before Breakfast and 40 before supper  . insulin regular (NOVOLIN R) 100 units/mL injection Inject 10 units before breakfast and again before lunch. Inject 12 units before supper.  Marland Kitchen ipratropium-albuterol (DUONEB) 0.5-2.5 (3) MG/3ML SOLN USE 1 VIAL IN NEBULIZER EVERY 6 HOURS AS NEEDED  . nystatin (MYCOSTATIN/NYSTOP) powder Apply topically 2 (two) times daily. Apply to groin area and scrotum skin; keep area clean and dry.  Marland Kitchen oxyCODONE (ROXICODONE) 5 MG immediate release tablet Take 1 tablet (5 mg total) by mouth every 6 (six) hours as needed for severe pain. Or shortness of breath  . oxymetazoline (AFRIN) 0.05 % nasal spray Place 1 spray into both nostrils 2 (two) times daily as needed for congestion.  . simvastatin (ZOCOR) 40 MG tablet Take 1 tablet (40 mg total) by mouth daily.  . Tiotropium Bromide-Olodaterol (STIOLTO RESPIMAT) 2.5-2.5 MCG/ACT AERS Inhale 2 puffs into the lungs daily.  . Vitamin D, Cholecalciferol, 50 MCG (2000 UT) CAPS Take 1 capsule by mouth in the morning.    No facility-administered encounter medications on file as of 01/21/2020.    Goals    .  Client will talk with LCSW in next 30 days to discuss anxiety, stress issues of client (pt-stated)  Current Barriers:  . Anxiety issues in client with Chronic Diagnoses of GAD, COPD,DM Type 2, HLD, HTN, and GERD . Breathing issues . Irregular meal schedule  Clinical Social Work Clinical Goal(s):  . Client will talk with LCSW in next 30 days to discus anxiety and stress issues of client  Interventions: . Talked with Colen Darling of client about client needs . Talked with [name] about client support with [facility] [place] . Discussed CCM program services with LCSW  and RNCM . Discussed anxiety management for client . Discussed relaxation techniques of client (client likes to watch TV, talks with [name], and friend from church  . Talked with [name] about pain issues of client . Talked with [name] about client use of oxygen [name] said client is on 4.5 liters of oxygen via nasal canula continuously) . Talked with [name] about RN visits and PA visits from Hospice to client at home of client . Talked with [name] about client communication ability . Talked with [name] about sleeping of client . Talked with [name] about family support for client . Talked with [name] about plan for client to have Respite Care next week at [facility] in [place], [place] to help spouse of client have a care break and to get needed rest . Talked with [name] about appetite of client   Patient Self Care Activities:  . Attends scheduled medical appointments . Takes medications as prescribed  Plan:    Client to attend scheduled medical appointments LCSW to call client or spouse of client  in next 4 weeks to talk with client about his anxiety and stress symptoms management.  Client or spouse to talk with RNCM as needed to discuss nursing needs  Initial goal documentation       Follow Up Plan: LCSW to call client/spouse in next 4 weeks to talk with client/spouse about client's anxiety/stress symptoms management  Norva Riffle.Drue Camera MSW, LCSW Licensed Clinical Social Worker [facility] Care Management [phone]

## 2020-01-21 NOTE — Patient Instructions (Addendum)
Licensed Clinical Education officer, museum Visit Information  Goals we discussed today:   Client will talk with LCSW in next 30 days to discuss anxiety, stress issues of client (pt-stated)          Current Barriers:   Anxiety issues in client with Chronic Diagnoses of GAD, COPD,DM Type 2, HLD, HTN, and GERD  Breathing issues  Irregular meal schedule  Clinical Social Work Clinical Goal(s):   Client will talk with LCSW in next 30 days to discus anxiety and stress issues of client  Interventions:  Talked with Colen Darling of client about client needs  Talked with [name] about client support with [facility] [place] program services with LCSW and RNCM  Discussed anxiety management for client  Discussed relaxation techniques of client (client likes to watch TV, talks with [name], and friend from church   Talked with [name] about pain issues of client  Talked with [name] about client use of oxygen [name] said client is on 4.5 liters of oxygen via nasal canula continuously)  Talked with [name] about RN visits and PA visits from Hospice to client at home of client  Talked with [name] about client communication ability  Talked with [name] about sleeping of client  Talked with [name] about family support for client  Talked with [name] about plan for client to have Respite Care next week at [facility] in [place], [place] to help spouse of client have a care break and to get needed rest  Talked with [name] about appetite of client   Patient Self Care Activities:   Attends scheduled medical appointments  Takes medications as prescribed  Plan:    Client to attend scheduled medical appointments LCSW to call client or spouse of client  in next 4 weeks to talk with client about his anxiety and stress symptoms management.  Client or spouse to talk with RNCM as needed to discuss nursing needs  Initial goal documentation        Follow Up Plan: LCSW to call client/spouse in next 4 weeks to talk with client/spouse about client's anxiety/stress symptoms management  Materials Provided: No  The patient/Justin Ruiz , spouse of client, verbalized understanding of instructions provided today and declined a print copy of patient instruction materials.   Norva Riffle.Justin Ruiz MSW, LCSW Licensed Clinical Social Worker [facility] Care Management [phone]

## 2020-01-22 ENCOUNTER — Ambulatory Visit: Payer: Medicare Other | Admitting: Family Medicine

## 2020-01-22 ENCOUNTER — Ambulatory Visit: Payer: Medicare Other | Admitting: *Deleted

## 2020-01-22 DIAGNOSIS — I5032 Chronic diastolic (congestive) heart failure: Secondary | ICD-10-CM

## 2020-01-22 DIAGNOSIS — Z794 Long term (current) use of insulin: Secondary | ICD-10-CM

## 2020-01-22 DIAGNOSIS — I10 Essential (primary) hypertension: Secondary | ICD-10-CM

## 2020-01-22 NOTE — Patient Instructions (Signed)
CCM team to talk with patient/wife over the next 30 days to discuss Hospice care and need for continuation of CCM services.   [name], BSN, RN-BC Embedded Chronic Care Manager [facility] Management Direct Dial: [phone]

## 2020-01-22 NOTE — Chronic Care Management (AMB) (Signed)
  Chronic Care Management   Outreach Note  07/16/2019 Name: Justin Ruiz MRN: [id number] DOB: 21-May-1950  Referred by: [name], MD Reason for referral : Chronic Care Management (RN follow up)   An unsuccessful telephone outreach was attempted today. The patient was referred to the case management team for assistance with care management and care coordination.   Follow Up Plan: A HIPAA compliant phone message was left for the patient providing contact information and requesting a return call.  The care management team will reach out to the patient again over the next 30 days.   [name], BSN, RN-BC Embedded Chronic Care Manager [facility] [place] [facility] Direct Dial: [phone]

## 2020-01-22 NOTE — Chronic Care Management (AMB) (Signed)
  Chronic Care Management   Care Coordination Note  01/22/2020 Name: Justin Ruiz MRN: [id number] DOB: 10-19-1950  Talked with Justin Nan, LCSW who spoke with patient's wife, Justin Ruiz, yesterday regarding in-home Hospice care and upcoming 5 day respite care at the [facility].   Follow up plan: CCM team will talk with patient/wife over the next 30 days regarding status of Hospice care and discuss if CCM services will still be needed.   [name], BSN, RN-BC Embedded Chronic Care Manager [facility] [place] [facility] Management Direct Dial: [phone]

## 2020-01-22 NOTE — Progress Notes (Signed)
This encounter was created in error - please disregard.

## 2020-01-27 ENCOUNTER — Other Ambulatory Visit: Payer: Self-pay

## 2020-01-27 ENCOUNTER — Ambulatory Visit (INDEPENDENT_AMBULATORY_CARE_PROVIDER_SITE_OTHER): Payer: Medicare Other

## 2020-01-27 DIAGNOSIS — E785 Hyperlipidemia, unspecified: Secondary | ICD-10-CM

## 2020-01-27 DIAGNOSIS — J302 Other seasonal allergic rhinitis: Secondary | ICD-10-CM

## 2020-01-27 DIAGNOSIS — D696 Thrombocytopenia, unspecified: Secondary | ICD-10-CM

## 2020-01-27 DIAGNOSIS — K279 Peptic ulcer, site unspecified, unspecified as acute or chronic, without hemorrhage or perforation: Secondary | ICD-10-CM

## 2020-01-27 DIAGNOSIS — N400 Enlarged prostate without lower urinary tract symptoms: Secondary | ICD-10-CM

## 2020-01-27 DIAGNOSIS — I4891 Unspecified atrial fibrillation: Secondary | ICD-10-CM

## 2020-01-27 DIAGNOSIS — E669 Obesity, unspecified: Secondary | ICD-10-CM

## 2020-01-27 DIAGNOSIS — F419 Anxiety disorder, unspecified: Secondary | ICD-10-CM

## 2020-01-27 DIAGNOSIS — D472 Monoclonal gammopathy: Secondary | ICD-10-CM

## 2020-01-27 DIAGNOSIS — E11319 Type 2 diabetes mellitus with unspecified diabetic retinopathy without macular edema: Secondary | ICD-10-CM

## 2020-01-27 DIAGNOSIS — J449 Chronic obstructive pulmonary disease, unspecified: Secondary | ICD-10-CM

## 2020-01-27 DIAGNOSIS — G4733 Obstructive sleep apnea (adult) (pediatric): Secondary | ICD-10-CM

## 2020-01-27 DIAGNOSIS — K219 Gastro-esophageal reflux disease without esophagitis: Secondary | ICD-10-CM

## 2020-01-27 DIAGNOSIS — Z87891 Personal history of nicotine dependence: Secondary | ICD-10-CM

## 2020-01-27 DIAGNOSIS — R911 Solitary pulmonary nodule: Secondary | ICD-10-CM

## 2020-01-27 DIAGNOSIS — I110 Hypertensive heart disease with heart failure: Secondary | ICD-10-CM | POA: Diagnosis not present

## 2020-01-27 DIAGNOSIS — I5030 Unspecified diastolic (congestive) heart failure: Secondary | ICD-10-CM | POA: Diagnosis not present

## 2020-01-27 DIAGNOSIS — Z8572 Personal history of non-Hodgkin lymphomas: Secondary | ICD-10-CM

## 2020-01-27 DIAGNOSIS — S81812D Laceration without foreign body, left lower leg, subsequent encounter: Secondary | ICD-10-CM

## 2020-01-27 DIAGNOSIS — R590 Localized enlarged lymph nodes: Secondary | ICD-10-CM

## 2020-02-19 ENCOUNTER — Telehealth: Payer: Medicare Other

## 2020-02-24 DEATH — deceased

## 2020-04-01 ENCOUNTER — Ambulatory Visit: Payer: Medicare Other | Admitting: Internal Medicine

## 2021-06-20 IMAGING — DX DG CHEST 1V PORT
1 series · 1 of 1 positions shown · non-contrast
Comparison: 11/01/2018

CLINICAL DATA: Shortness of breath

EXAM:
PORTABLE CHEST 1 VIEW

[chest ap]
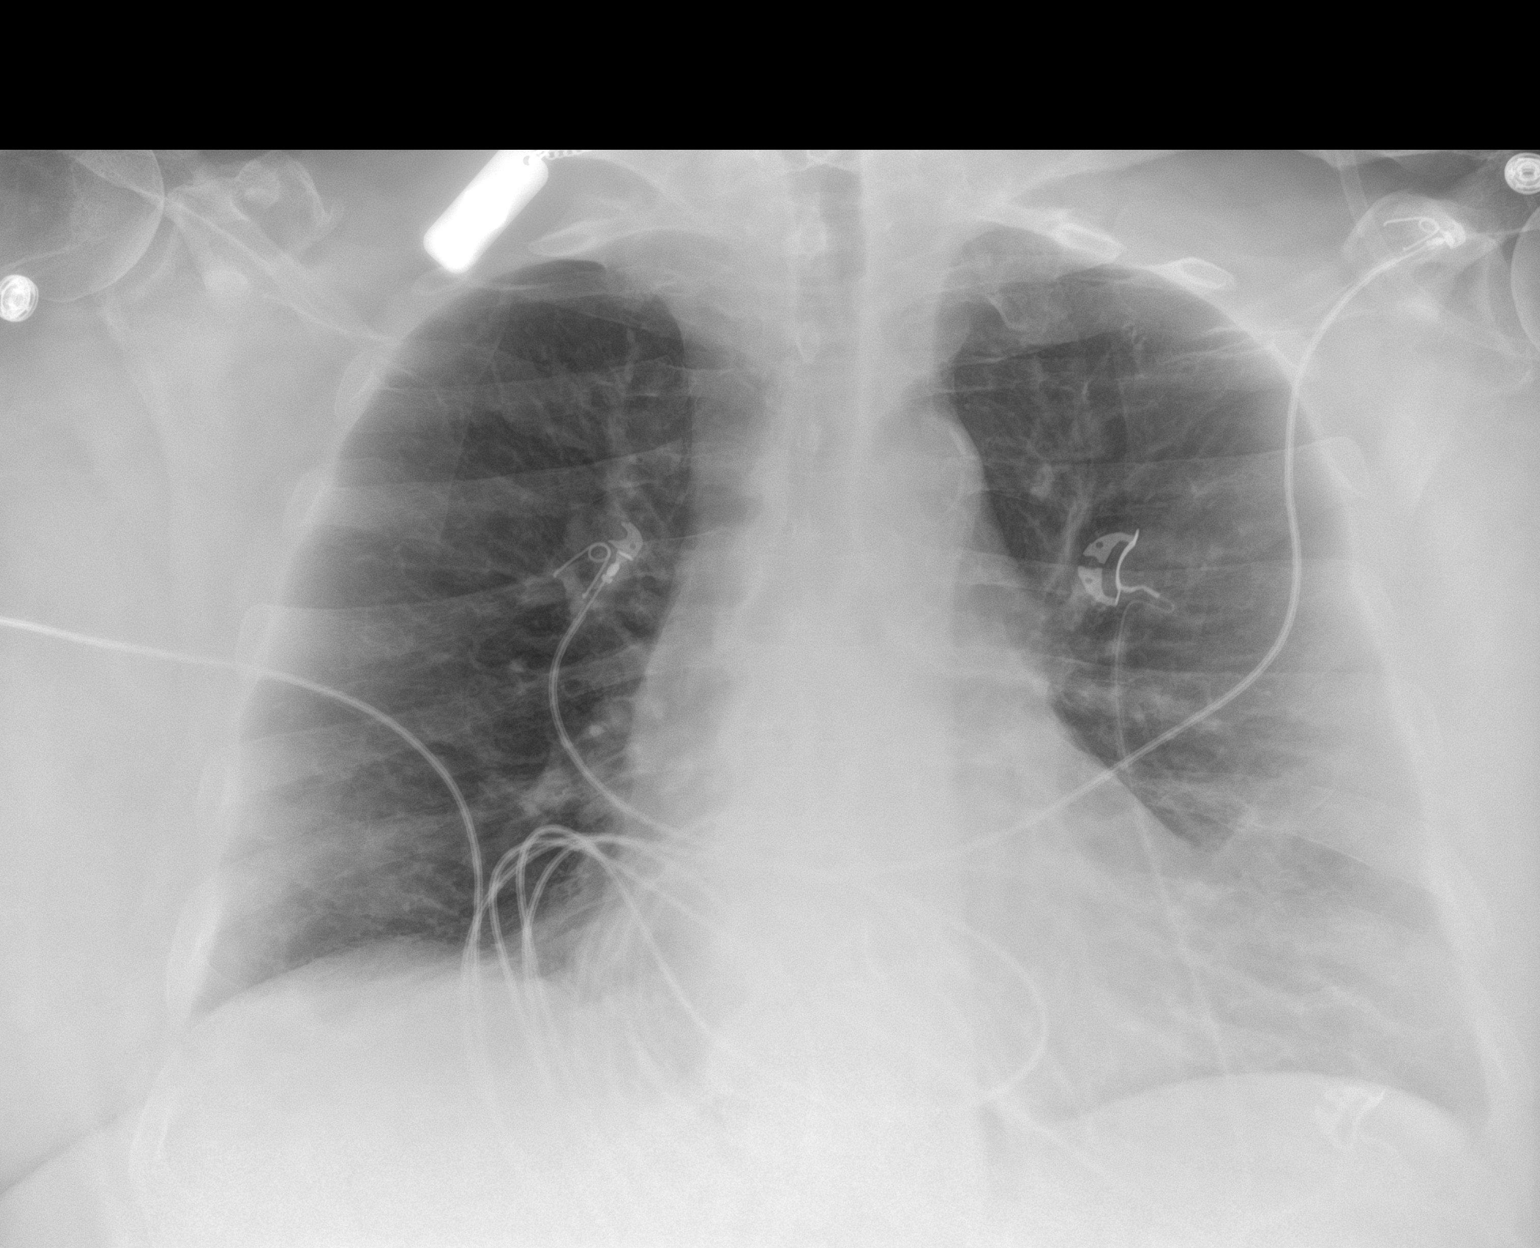

[1 of 1 positions shown; findings below may reference images not displayed]

FINDINGS: The heart size and mediastinal contours are stable. Calcific aortic
knob. Mild pulmonary vascular congestion. Minimal linear scarring
versus atelectasis in the left lung base. No focal airspace
consolidation, pleural effusion, or pneumothorax. The visualized
skeletal structures are unremarkable.
IMPRESSION: Mild pulmonary vascular congestion.
